# Patient Record
Sex: Female | Born: 1935
Health system: Southern US, Community
[De-identification: ages and names within clinical notes are randomized; demographics above are authoritative.]

## PROBLEM LIST (undated history)

## (undated) ENCOUNTER — Emergency Department (HOSPITAL_COMMUNITY): Disposition: A | Discharge status: Home / Self Care | Payer: Medicare Other

## (undated) DIAGNOSIS — E039 Hypothyroidism, unspecified: Secondary | ICD-10-CM

## (undated) DIAGNOSIS — C801 Malignant (primary) neoplasm, unspecified: Secondary | ICD-10-CM

## (undated) DIAGNOSIS — J449 Chronic obstructive pulmonary disease, unspecified: Secondary | ICD-10-CM

## (undated) DIAGNOSIS — K5790 Diverticulosis of intestine, part unspecified, without perforation or abscess without bleeding: Secondary | ICD-10-CM

## (undated) DIAGNOSIS — I272 Pulmonary hypertension, unspecified: Secondary | ICD-10-CM

## (undated) DIAGNOSIS — F32A Depression, unspecified: Secondary | ICD-10-CM

## (undated) DIAGNOSIS — J189 Pneumonia, unspecified organism: Secondary | ICD-10-CM

## (undated) DIAGNOSIS — M797 Fibromyalgia: Secondary | ICD-10-CM

## (undated) DIAGNOSIS — I241 Dressler's syndrome: Secondary | ICD-10-CM

## (undated) DIAGNOSIS — Z5189 Encounter for other specified aftercare: Secondary | ICD-10-CM

## (undated) DIAGNOSIS — H3530 Unspecified macular degeneration: Secondary | ICD-10-CM

## (undated) DIAGNOSIS — E785 Hyperlipidemia, unspecified: Secondary | ICD-10-CM

## (undated) DIAGNOSIS — I219 Acute myocardial infarction, unspecified: Secondary | ICD-10-CM

## (undated) DIAGNOSIS — I639 Cerebral infarction, unspecified: Secondary | ICD-10-CM

## (undated) DIAGNOSIS — I2510 Atherosclerotic heart disease of native coronary artery without angina pectoris: Secondary | ICD-10-CM

## (undated) DIAGNOSIS — D126 Benign neoplasm of colon, unspecified: Secondary | ICD-10-CM

## (undated) DIAGNOSIS — I7300 Raynaud's syndrome without gangrene: Secondary | ICD-10-CM

## (undated) DIAGNOSIS — H548 Legal blindness, as defined in USA: Secondary | ICD-10-CM

## (undated) DIAGNOSIS — K5909 Other constipation: Secondary | ICD-10-CM

## (undated) DIAGNOSIS — K219 Gastro-esophageal reflux disease without esophagitis: Secondary | ICD-10-CM

## (undated) DIAGNOSIS — M5090 Cervical disc disorder, unspecified, unspecified cervical region: Secondary | ICD-10-CM

## (undated) DIAGNOSIS — M3500 Sicca syndrome, unspecified: Secondary | ICD-10-CM

## (undated) DIAGNOSIS — F329 Major depressive disorder, single episode, unspecified: Secondary | ICD-10-CM

## (undated) DIAGNOSIS — M349 Systemic sclerosis, unspecified: Secondary | ICD-10-CM

## (undated) DIAGNOSIS — K589 Irritable bowel syndrome without diarrhea: Secondary | ICD-10-CM

## (undated) DIAGNOSIS — G4730 Sleep apnea, unspecified: Secondary | ICD-10-CM

## (undated) DIAGNOSIS — K225 Diverticulum of esophagus, acquired: Secondary | ICD-10-CM

## (undated) DIAGNOSIS — I10 Essential (primary) hypertension: Secondary | ICD-10-CM

## (undated) DIAGNOSIS — M1990 Unspecified osteoarthritis, unspecified site: Secondary | ICD-10-CM

## (undated) DIAGNOSIS — IMO0001 Reserved for inherently not codable concepts without codable children: Secondary | ICD-10-CM

## (undated) HISTORY — DX: Systemic sclerosis, unspecified: M34.9

## (undated) HISTORY — DX: Gastro-esophageal reflux disease without esophagitis: K21.9

## (undated) HISTORY — DX: Acute myocardial infarction, unspecified: I21.9

## (undated) HISTORY — DX: Reserved for inherently not codable concepts without codable children: IMO0001

## (undated) HISTORY — DX: Unspecified macular degeneration: H35.30

## (undated) HISTORY — DX: Encounter for other specified aftercare: Z51.89

## (undated) HISTORY — DX: Other constipation: K59.09

## (undated) HISTORY — DX: Diverticulosis of intestine, part unspecified, without perforation or abscess without bleeding: K57.90

## (undated) HISTORY — DX: Atherosclerotic heart disease of native coronary artery without angina pectoris: I25.10

## (undated) HISTORY — PX: CARDIAC CATHETERIZATION: SHX172

## (undated) HISTORY — DX: Benign neoplasm of colon, unspecified: D12.6

## (undated) HISTORY — PX: APPENDECTOMY: SHX54

## (undated) HISTORY — DX: Sjogren syndrome, unspecified: M35.00

## (undated) HISTORY — DX: Hyperlipidemia, unspecified: E78.5

## (undated) HISTORY — DX: Irritable bowel syndrome, unspecified: K58.9

## (undated) HISTORY — PX: CATARACT EXTRACTION: SUR2

## (undated) HISTORY — DX: Raynaud's syndrome without gangrene: I73.00

## (undated) HISTORY — DX: Cerebral infarction, unspecified: I63.9

## (undated) HISTORY — DX: Chronic obstructive pulmonary disease, unspecified: J44.9

## (undated) HISTORY — DX: Major depressive disorder, single episode, unspecified: F32.9

## (undated) HISTORY — DX: Hypothyroidism, unspecified: E03.9

## (undated) HISTORY — PX: OTHER SURGICAL HISTORY: SHX169

## (undated) HISTORY — DX: Unspecified osteoarthritis, unspecified site: M19.90

## (undated) HISTORY — DX: Cervical disc disorder, unspecified, unspecified cervical region: M50.90

## (undated) HISTORY — DX: Pulmonary hypertension, unspecified: I27.20

## (undated) HISTORY — DX: Essential (primary) hypertension: I10

## (undated) HISTORY — DX: Dressler's syndrome: I24.1

## (undated) HISTORY — DX: Fibromyalgia: M79.7

## (undated) HISTORY — DX: Diverticulum of esophagus, acquired: K22.5

## (undated) HISTORY — DX: Depression, unspecified: F32.A

---

## 1963-06-27 HISTORY — PX: CHOLECYSTECTOMY: SHX55

## 1971-06-27 HISTORY — PX: TOTAL ABDOMINAL HYSTERECTOMY: SHX209

## 1988-06-26 DIAGNOSIS — I639 Cerebral infarction, unspecified: Secondary | ICD-10-CM

## 1988-06-26 HISTORY — DX: Cerebral infarction, unspecified: I63.9

## 1989-06-26 DIAGNOSIS — I639 Cerebral infarction, unspecified: Secondary | ICD-10-CM

## 1989-06-26 HISTORY — DX: Cerebral infarction, unspecified: I63.9

## 1989-06-26 HISTORY — PX: THYROID LOBECTOMY: SHX420

## 1995-06-27 HISTORY — PX: SPINAL FUSION: SHX223

## 1999-08-08 ENCOUNTER — Encounter: Payer: Self-pay | Admitting: Neurosurgery

## 1999-08-08 ENCOUNTER — Encounter: Admission: RE | Admit: 1999-08-08 | Discharge: 1999-08-08 | Payer: Self-pay | Admitting: Neurosurgery

## 1999-08-12 ENCOUNTER — Encounter: Payer: Self-pay | Admitting: Neurosurgery

## 1999-08-12 ENCOUNTER — Encounter: Admission: RE | Admit: 1999-08-12 | Discharge: 1999-08-12 | Payer: Self-pay | Admitting: Neurosurgery

## 2000-02-20 ENCOUNTER — Other Ambulatory Visit: Admission: RE | Admit: 2000-02-20 | Discharge: 2000-02-20 | Payer: Self-pay | Admitting: *Deleted

## 2000-06-26 HISTORY — PX: BILATERAL OOPHORECTOMY: SHX1221

## 2000-08-14 ENCOUNTER — Encounter: Payer: Self-pay | Admitting: *Deleted

## 2000-08-15 ENCOUNTER — Encounter (INDEPENDENT_AMBULATORY_CARE_PROVIDER_SITE_OTHER): Payer: Self-pay | Admitting: Specialist

## 2000-08-15 ENCOUNTER — Observation Stay (HOSPITAL_COMMUNITY): Admission: RE | Admit: 2000-08-15 | Discharge: 2000-08-16 | Payer: Self-pay | Admitting: *Deleted

## 2000-10-30 ENCOUNTER — Encounter: Admission: RE | Admit: 2000-10-30 | Discharge: 2000-10-30 | Payer: Self-pay | Admitting: Gastroenterology

## 2000-10-30 ENCOUNTER — Encounter: Payer: Self-pay | Admitting: Gastroenterology

## 2001-12-17 ENCOUNTER — Ambulatory Visit (HOSPITAL_COMMUNITY): Admission: RE | Admit: 2001-12-17 | Discharge: 2001-12-17 | Payer: Self-pay | Admitting: Gastroenterology

## 2001-12-17 ENCOUNTER — Encounter: Payer: Self-pay | Admitting: Gastroenterology

## 2001-12-20 ENCOUNTER — Ambulatory Visit (HOSPITAL_COMMUNITY): Admission: RE | Admit: 2001-12-20 | Discharge: 2001-12-20 | Payer: Self-pay | Admitting: Gastroenterology

## 2001-12-20 ENCOUNTER — Encounter: Payer: Self-pay | Admitting: Gastroenterology

## 2002-09-04 ENCOUNTER — Ambulatory Visit (HOSPITAL_COMMUNITY): Admission: RE | Admit: 2002-09-04 | Discharge: 2002-09-04 | Payer: Self-pay | Admitting: Neurosurgery

## 2002-09-04 ENCOUNTER — Encounter: Payer: Self-pay | Admitting: Neurosurgery

## 2002-10-13 ENCOUNTER — Encounter: Payer: Self-pay | Admitting: Neurosurgery

## 2002-10-15 ENCOUNTER — Inpatient Hospital Stay (HOSPITAL_COMMUNITY): Admission: RE | Admit: 2002-10-15 | Discharge: 2002-10-21 | Payer: Self-pay | Admitting: Neurosurgery

## 2002-10-15 ENCOUNTER — Encounter: Payer: Self-pay | Admitting: Neurosurgery

## 2002-10-21 ENCOUNTER — Inpatient Hospital Stay (HOSPITAL_COMMUNITY)
Admission: RE | Admit: 2002-10-21 | Discharge: 2002-10-27 | Payer: Self-pay | Admitting: Physical Medicine & Rehabilitation

## 2003-07-14 ENCOUNTER — Ambulatory Visit (HOSPITAL_COMMUNITY): Admission: RE | Admit: 2003-07-14 | Discharge: 2003-07-15 | Payer: Self-pay | Admitting: Cardiology

## 2004-05-16 ENCOUNTER — Ambulatory Visit: Payer: Self-pay | Admitting: Internal Medicine

## 2004-07-01 ENCOUNTER — Ambulatory Visit: Payer: Self-pay

## 2004-09-19 ENCOUNTER — Emergency Department (HOSPITAL_COMMUNITY): Admission: EM | Admit: 2004-09-19 | Discharge: 2004-09-19 | Payer: Self-pay | Admitting: Emergency Medicine

## 2004-10-24 ENCOUNTER — Ambulatory Visit: Payer: Self-pay | Admitting: Internal Medicine

## 2005-07-16 ENCOUNTER — Encounter: Admission: RE | Admit: 2005-07-16 | Discharge: 2005-07-16 | Payer: Self-pay | Admitting: Neurosurgery

## 2005-07-20 ENCOUNTER — Ambulatory Visit: Payer: Self-pay | Admitting: Internal Medicine

## 2005-07-31 ENCOUNTER — Ambulatory Visit: Payer: Self-pay

## 2005-08-21 ENCOUNTER — Ambulatory Visit: Payer: Self-pay | Admitting: Internal Medicine

## 2005-10-23 ENCOUNTER — Ambulatory Visit: Payer: Self-pay | Admitting: Gastroenterology

## 2005-11-27 ENCOUNTER — Ambulatory Visit: Payer: Self-pay | Admitting: Gastroenterology

## 2005-12-05 ENCOUNTER — Ambulatory Visit: Payer: Self-pay | Admitting: Gastroenterology

## 2005-12-05 ENCOUNTER — Encounter (INDEPENDENT_AMBULATORY_CARE_PROVIDER_SITE_OTHER): Payer: Self-pay | Admitting: *Deleted

## 2005-12-15 ENCOUNTER — Ambulatory Visit: Payer: Self-pay | Admitting: Gastroenterology

## 2005-12-15 ENCOUNTER — Ambulatory Visit: Payer: Self-pay | Admitting: Cardiovascular Disease

## 2005-12-20 ENCOUNTER — Other Ambulatory Visit: Admission: RE | Admit: 2005-12-20 | Discharge: 2005-12-20 | Payer: Self-pay | Admitting: *Deleted

## 2006-11-08 ENCOUNTER — Ambulatory Visit: Payer: Self-pay | Admitting: Internal Medicine

## 2007-04-25 ENCOUNTER — Encounter: Admission: RE | Admit: 2007-04-25 | Discharge: 2007-04-25 | Payer: Self-pay | Admitting: Internal Medicine

## 2007-05-28 ENCOUNTER — Ambulatory Visit: Payer: Self-pay | Admitting: Pulmonary Disease

## 2007-05-28 LAB — CONVERTED CEMR LAB
ALT: 20 units/L (ref 0–35)
ANA Titer 1: 1:160 {titer} — ABNORMAL HIGH
AST: 23 units/L (ref 0–37)
Albumin: 4 g/dL (ref 3.5–5.2)
Alkaline Phosphatase: 59 units/L (ref 39–117)
Angiotensin 1 Converting Enzyme: 35 units/L (ref 9–67)
Anti Nuclear Antibody(ANA): POSITIVE — AB
BUN: 13 mg/dL (ref 6–23)
Basophils Absolute: 0.1 10*3/uL (ref 0.0–0.1)
Basophils Relative: 1 % (ref 0.0–1.0)
Bilirubin, Direct: 0.1 mg/dL (ref 0.0–0.3)
CO2: 29 meq/L (ref 19–32)
Calcium: 9.4 mg/dL (ref 8.4–10.5)
Chloride: 95 meq/L — ABNORMAL LOW (ref 96–112)
Creatinine, Ser: 0.9 mg/dL (ref 0.4–1.2)
Eosinophils Absolute: 0.4 10*3/uL (ref 0.0–0.6)
Eosinophils Relative: 4.2 % (ref 0.0–5.0)
GFR calc Af Amer: 79 mL/min
GFR calc non Af Amer: 65 mL/min
Glucose, Bld: 147 mg/dL — ABNORMAL HIGH (ref 70–99)
HCT: 39.2 % (ref 36.0–46.0)
Hemoglobin: 13.6 g/dL (ref 12.0–15.0)
IgE (Immunoglobulin E), Serum: 4.7 intl units/mL (ref 0.0–180.0)
Lymphocytes Relative: 27.8 % (ref 12.0–46.0)
MCHC: 34.7 g/dL (ref 30.0–36.0)
MCV: 82.4 fL (ref 78.0–100.0)
Monocytes Absolute: 0.7 10*3/uL (ref 0.2–0.7)
Monocytes Relative: 7.9 % (ref 3.0–11.0)
Neutro Abs: 5.1 10*3/uL (ref 1.4–7.7)
Neutrophils Relative %: 59.1 % (ref 43.0–77.0)
Phosphorus: 4 mg/dL (ref 2.3–4.6)
Platelets: 280 10*3/uL (ref 150–400)
Potassium: 4.2 meq/L (ref 3.5–5.1)
RBC: 4.76 M/uL (ref 3.87–5.11)
RDW: 14.2 % (ref 11.5–14.6)
Sed Rate: 22 mm/hr (ref 0–25)
Sodium: 136 meq/L (ref 135–145)
TSH: 3.1 microintl units/mL (ref 0.35–5.50)
Total Bilirubin: 0.6 mg/dL (ref 0.3–1.2)
Total Protein: 6.9 g/dL (ref 6.0–8.3)
WBC: 8.7 10*3/uL (ref 4.5–10.5)

## 2007-06-11 ENCOUNTER — Ambulatory Visit: Payer: Self-pay | Admitting: Gastroenterology

## 2007-06-11 LAB — CONVERTED CEMR LAB: Tissue Transglutaminase Ab, IgA: 0.5 units (ref <7)

## 2007-06-26 DIAGNOSIS — I10 Essential (primary) hypertension: Secondary | ICD-10-CM | POA: Insufficient documentation

## 2007-06-26 DIAGNOSIS — F329 Major depressive disorder, single episode, unspecified: Secondary | ICD-10-CM

## 2007-06-26 DIAGNOSIS — E785 Hyperlipidemia, unspecified: Secondary | ICD-10-CM | POA: Insufficient documentation

## 2007-06-26 DIAGNOSIS — R141 Gas pain: Secondary | ICD-10-CM

## 2007-06-26 DIAGNOSIS — M47812 Spondylosis without myelopathy or radiculopathy, cervical region: Secondary | ICD-10-CM | POA: Insufficient documentation

## 2007-06-26 DIAGNOSIS — I6350 Cerebral infarction due to unspecified occlusion or stenosis of unspecified cerebral artery: Secondary | ICD-10-CM | POA: Insufficient documentation

## 2007-06-26 DIAGNOSIS — K2289 Other specified disease of esophagus: Secondary | ICD-10-CM | POA: Insufficient documentation

## 2007-06-26 DIAGNOSIS — K5909 Other constipation: Secondary | ICD-10-CM

## 2007-06-26 DIAGNOSIS — E079 Disorder of thyroid, unspecified: Secondary | ICD-10-CM | POA: Insufficient documentation

## 2007-06-26 DIAGNOSIS — K589 Irritable bowel syndrome without diarrhea: Secondary | ICD-10-CM | POA: Insufficient documentation

## 2007-06-26 DIAGNOSIS — K5730 Diverticulosis of large intestine without perforation or abscess without bleeding: Secondary | ICD-10-CM | POA: Insufficient documentation

## 2007-06-26 DIAGNOSIS — K219 Gastro-esophageal reflux disease without esophagitis: Secondary | ICD-10-CM | POA: Insufficient documentation

## 2007-06-26 DIAGNOSIS — M129 Arthropathy, unspecified: Secondary | ICD-10-CM | POA: Insufficient documentation

## 2007-06-26 DIAGNOSIS — K2970 Gastritis, unspecified, without bleeding: Secondary | ICD-10-CM | POA: Insufficient documentation

## 2007-06-26 DIAGNOSIS — K660 Peritoneal adhesions (postprocedural) (postinfection): Secondary | ICD-10-CM | POA: Insufficient documentation

## 2007-06-26 DIAGNOSIS — K225 Diverticulum of esophagus, acquired: Secondary | ICD-10-CM | POA: Insufficient documentation

## 2007-06-26 DIAGNOSIS — E119 Type 2 diabetes mellitus without complications: Secondary | ICD-10-CM | POA: Insufficient documentation

## 2007-06-26 DIAGNOSIS — R142 Eructation: Secondary | ICD-10-CM | POA: Insufficient documentation

## 2007-06-26 DIAGNOSIS — K2990 Gastroduodenitis, unspecified, without bleeding: Secondary | ICD-10-CM

## 2007-06-26 DIAGNOSIS — R143 Flatulence: Secondary | ICD-10-CM

## 2007-06-26 DIAGNOSIS — H548 Legal blindness, as defined in USA: Secondary | ICD-10-CM | POA: Insufficient documentation

## 2007-06-26 DIAGNOSIS — I2510 Atherosclerotic heart disease of native coronary artery without angina pectoris: Secondary | ICD-10-CM | POA: Insufficient documentation

## 2007-06-26 DIAGNOSIS — R1310 Dysphagia, unspecified: Secondary | ICD-10-CM | POA: Insufficient documentation

## 2007-06-26 DIAGNOSIS — K228 Other specified diseases of esophagus: Secondary | ICD-10-CM

## 2007-06-26 HISTORY — DX: Other constipation: K59.09

## 2007-07-08 ENCOUNTER — Ambulatory Visit: Payer: Self-pay | Admitting: Pulmonary Disease

## 2007-07-08 DIAGNOSIS — J310 Chronic rhinitis: Secondary | ICD-10-CM | POA: Insufficient documentation

## 2007-07-08 DIAGNOSIS — J438 Other emphysema: Secondary | ICD-10-CM | POA: Insufficient documentation

## 2007-07-23 ENCOUNTER — Ambulatory Visit: Payer: Self-pay | Admitting: Gastroenterology

## 2007-07-23 LAB — CONVERTED CEMR LAB
Fecal Occult Blood: NEGATIVE
OCCULT 1: NEGATIVE
OCCULT 2: NEGATIVE
OCCULT 3: NEGATIVE
OCCULT 4: NEGATIVE
OCCULT 5: NEGATIVE

## 2007-07-24 ENCOUNTER — Telehealth: Payer: Self-pay | Admitting: Pulmonary Disease

## 2007-07-26 ENCOUNTER — Telehealth (INDEPENDENT_AMBULATORY_CARE_PROVIDER_SITE_OTHER): Payer: Self-pay | Admitting: *Deleted

## 2007-08-06 ENCOUNTER — Encounter: Payer: Self-pay | Admitting: Pulmonary Disease

## 2007-08-21 ENCOUNTER — Encounter: Payer: Self-pay | Admitting: Pulmonary Disease

## 2007-09-17 ENCOUNTER — Encounter: Admission: RE | Admit: 2007-09-17 | Discharge: 2007-09-17 | Payer: Self-pay

## 2007-09-26 ENCOUNTER — Encounter: Admission: RE | Admit: 2007-09-26 | Discharge: 2007-09-26 | Payer: Self-pay | Admitting: Neurosurgery

## 2007-11-07 ENCOUNTER — Ambulatory Visit: Payer: Self-pay | Admitting: Internal Medicine

## 2007-11-15 ENCOUNTER — Ambulatory Visit: Payer: Self-pay | Admitting: Internal Medicine

## 2007-11-15 LAB — CONVERTED CEMR LAB
Cholesterol: 194 mg/dL (ref 0–200)
HDL: 38.2 mg/dL — ABNORMAL LOW (ref >39.0)
LDL Cholesterol: 128 mg/dL — ABNORMAL HIGH (ref 0–99)
Total CHOL/HDL Ratio: 5.1
Triglycerides: 138 mg/dL (ref 0–149)
VLDL: 28 mg/dL (ref 0–40)

## 2007-11-20 ENCOUNTER — Inpatient Hospital Stay (HOSPITAL_COMMUNITY): Admission: AD | Admit: 2007-11-20 | Discharge: 2007-11-24 | Payer: Self-pay | Admitting: Cardiovascular Disease

## 2007-11-20 ENCOUNTER — Ambulatory Visit: Payer: Self-pay | Admitting: Cardiovascular Disease

## 2007-11-27 ENCOUNTER — Ambulatory Visit: Payer: Self-pay | Admitting: Internal Medicine

## 2007-11-27 ENCOUNTER — Inpatient Hospital Stay (HOSPITAL_COMMUNITY): Admission: AD | Admit: 2007-11-27 | Discharge: 2007-11-28 | Payer: Self-pay | Admitting: Cardiology

## 2007-11-27 ENCOUNTER — Ambulatory Visit: Payer: Self-pay | Admitting: Cardiology

## 2007-11-27 ENCOUNTER — Ambulatory Visit: Payer: Self-pay | Admitting: Pulmonary Disease

## 2007-11-28 ENCOUNTER — Ambulatory Visit: Payer: Self-pay | Admitting: Vascular Surgery

## 2007-11-28 ENCOUNTER — Encounter: Payer: Self-pay | Admitting: Cardiology

## 2007-12-10 ENCOUNTER — Ambulatory Visit: Payer: Self-pay | Admitting: Pulmonary Disease

## 2007-12-11 ENCOUNTER — Ambulatory Visit: Payer: Self-pay | Admitting: Internal Medicine

## 2007-12-11 ENCOUNTER — Ambulatory Visit: Payer: Self-pay | Admitting: Cardiovascular Disease

## 2007-12-11 LAB — CONVERTED CEMR LAB
Basophils Absolute: 0.1 10*3/uL (ref 0.0–0.1)
Basophils Relative: 0.7 % (ref 0.0–1.0)
Eosinophils Absolute: 0.3 10*3/uL (ref 0.0–0.7)
Eosinophils Relative: 3.7 % (ref 0.0–5.0)
HCT: 36.5 % (ref 36.0–46.0)
Hemoglobin: 12.5 g/dL (ref 12.0–15.0)
Lymphocytes Relative: 27.3 % (ref 12.0–46.0)
MCHC: 34.3 g/dL (ref 30.0–36.0)
MCV: 86.5 fL (ref 78.0–100.0)
Monocytes Absolute: 0.7 10*3/uL (ref 0.1–1.0)
Monocytes Relative: 9 % (ref 3.0–12.0)
Neutro Abs: 4.8 10*3/uL (ref 1.4–7.7)
Neutrophils Relative %: 59.3 % (ref 43.0–77.0)
Platelets: 286 10*3/uL (ref 150–400)
RBC: 4.22 M/uL (ref 3.87–5.11)
RDW: 13.2 % (ref 11.5–14.6)
WBC: 8.1 10*3/uL (ref 4.5–10.5)

## 2007-12-13 ENCOUNTER — Ambulatory Visit: Payer: Self-pay | Admitting: Cardiology

## 2007-12-13 ENCOUNTER — Inpatient Hospital Stay (HOSPITAL_COMMUNITY): Admission: EM | Admit: 2007-12-13 | Discharge: 2007-12-16 | Payer: Self-pay | Admitting: Emergency Medicine

## 2007-12-14 ENCOUNTER — Encounter: Payer: Self-pay | Admitting: Internal Medicine

## 2007-12-20 ENCOUNTER — Ambulatory Visit: Payer: Self-pay | Admitting: Internal Medicine

## 2007-12-20 LAB — CONVERTED CEMR LAB
BUN: 12 mg/dL (ref 6–23)
Basophils Absolute: 0.1 10*3/uL (ref 0.0–0.1)
Basophils Relative: 0.7 % (ref 0.0–1.0)
CO2: 28 meq/L (ref 19–32)
Calcium: 8.9 mg/dL (ref 8.4–10.5)
Chloride: 97 meq/L (ref 96–112)
Creatinine, Ser: 0.9 mg/dL (ref 0.4–1.2)
Eosinophils Absolute: 0.4 10*3/uL (ref 0.0–0.7)
Eosinophils Relative: 4.5 % (ref 0.0–5.0)
GFR calc Af Amer: 79 mL/min
GFR calc non Af Amer: 65 mL/min
Glucose, Bld: 101 mg/dL — ABNORMAL HIGH (ref 70–99)
HCT: 33.8 % — ABNORMAL LOW (ref 36.0–46.0)
Hemoglobin: 11.4 g/dL — ABNORMAL LOW (ref 12.0–15.0)
Lymphocytes Relative: 20.7 % (ref 12.0–46.0)
MCHC: 33.6 g/dL (ref 30.0–36.0)
MCV: 86.7 fL (ref 78.0–100.0)
Monocytes Absolute: 1 10*3/uL (ref 0.1–1.0)
Monocytes Relative: 9.8 % (ref 3.0–12.0)
Neutro Abs: 6.2 10*3/uL (ref 1.4–7.7)
Neutrophils Relative %: 64.3 % (ref 43.0–77.0)
Platelets: 322 10*3/uL (ref 150–400)
Potassium: 4.2 meq/L (ref 3.5–5.1)
Pro B Natriuretic peptide (BNP): 345 pg/mL — ABNORMAL HIGH (ref 0.0–100.0)
RBC: 3.9 M/uL (ref 3.87–5.11)
RDW: 13.3 % (ref 11.5–14.6)
Sed Rate: 67 mm/hr — ABNORMAL HIGH (ref 0–22)
Sodium: 134 meq/L — ABNORMAL LOW (ref 135–145)
WBC: 9.7 10*3/uL (ref 4.5–10.5)

## 2007-12-30 ENCOUNTER — Ambulatory Visit: Payer: Self-pay | Admitting: Internal Medicine

## 2007-12-30 LAB — CONVERTED CEMR LAB
Basophils Absolute: 0.1 10*3/uL (ref 0.0–0.1)
Basophils Relative: 1 % (ref 0.0–1.0)
Eosinophils Absolute: 0.2 10*3/uL (ref 0.0–0.7)
Eosinophils Relative: 2 % (ref 0.0–5.0)
HCT: 36 % (ref 36.0–46.0)
Hemoglobin: 12.3 g/dL (ref 12.0–15.0)
Lymphocytes Relative: 14.4 % (ref 12.0–46.0)
MCHC: 34.2 g/dL (ref 30.0–36.0)
MCV: 84.6 fL (ref 78.0–100.0)
Monocytes Absolute: 1 10*3/uL (ref 0.1–1.0)
Monocytes Relative: 8.5 % (ref 3.0–12.0)
Neutro Abs: 8.8 10*3/uL — ABNORMAL HIGH (ref 1.4–7.7)
Neutrophils Relative %: 74.1 % (ref 43.0–77.0)
Platelets: 404 10*3/uL — ABNORMAL HIGH (ref 150–400)
Pro B Natriuretic peptide (BNP): 158 pg/mL — ABNORMAL HIGH (ref 0.0–100.0)
RBC: 4.25 M/uL (ref 3.87–5.11)
RDW: 12.7 % (ref 11.5–14.6)
Sed Rate: 53 mm/hr — ABNORMAL HIGH (ref 0–22)
WBC: 11.8 10*3/uL — ABNORMAL HIGH (ref 4.5–10.5)

## 2008-01-15 ENCOUNTER — Ambulatory Visit: Payer: Self-pay | Admitting: Cardiovascular Disease

## 2008-01-15 LAB — CONVERTED CEMR LAB
Basophils Absolute: 0.1 10*3/uL (ref 0.0–0.1)
Basophils Relative: 1.7 % (ref 0.0–3.0)
Eosinophils Absolute: 0.3 10*3/uL (ref 0.0–0.7)
Eosinophils Relative: 4 % (ref 0.0–5.0)
HCT: 39.5 % (ref 36.0–46.0)
Hemoglobin: 13.3 g/dL (ref 12.0–15.0)
Lymphocytes Relative: 21.9 % (ref 12.0–46.0)
MCHC: 33.7 g/dL (ref 30.0–36.0)
MCV: 85.3 fL (ref 78.0–100.0)
Monocytes Absolute: 0.5 10*3/uL (ref 0.1–1.0)
Monocytes Relative: 7 % (ref 3.0–12.0)
Neutro Abs: 5 10*3/uL (ref 1.4–7.7)
Neutrophils Relative %: 65.4 % (ref 43.0–77.0)
Platelets: 279 10*3/uL (ref 150–400)
RBC: 4.63 M/uL (ref 3.87–5.11)
RDW: 13.5 % (ref 11.5–14.6)
WBC: 7.6 10*3/uL (ref 4.5–10.5)

## 2008-01-29 ENCOUNTER — Ambulatory Visit: Payer: Self-pay | Admitting: Cardiovascular Disease

## 2008-01-29 LAB — CONVERTED CEMR LAB
Basophils Absolute: 0.1 10*3/uL (ref 0.0–0.1)
Basophils Relative: 1.9 % (ref 0.0–3.0)
Eosinophils Absolute: 0.3 10*3/uL (ref 0.0–0.7)
Eosinophils Relative: 4.7 % (ref 0.0–5.0)
HCT: 37.1 % (ref 36.0–46.0)
Hemoglobin: 12.6 g/dL (ref 12.0–15.0)
Lymphocytes Relative: 29.1 % (ref 12.0–46.0)
MCHC: 33.8 g/dL (ref 30.0–36.0)
MCV: 83.6 fL (ref 78.0–100.0)
Monocytes Absolute: 0.4 10*3/uL (ref 0.1–1.0)
Monocytes Relative: 7.7 % (ref 3.0–12.0)
Neutro Abs: 3.1 10*3/uL (ref 1.4–7.7)
Neutrophils Relative %: 56.6 % (ref 43.0–77.0)
Platelets: 278 10*3/uL (ref 150–400)
RBC: 4.44 M/uL (ref 3.87–5.11)
RDW: 13.7 % (ref 11.5–14.6)
WBC: 5.5 10*3/uL (ref 4.5–10.5)

## 2008-02-12 ENCOUNTER — Ambulatory Visit: Payer: Self-pay | Admitting: Internal Medicine

## 2008-02-12 LAB — CONVERTED CEMR LAB
Basophils Absolute: 0.1 10*3/uL (ref 0.0–0.1)
Basophils Relative: 1.5 % (ref 0.0–3.0)
Eosinophils Absolute: 0.3 10*3/uL (ref 0.0–0.7)
Eosinophils Relative: 5.3 % — ABNORMAL HIGH (ref 0.0–5.0)
HCT: 36.1 % (ref 36.0–46.0)
Hemoglobin: 12.6 g/dL (ref 12.0–15.0)
Lymphocytes Relative: 24.7 % (ref 12.0–46.0)
MCHC: 34.9 g/dL (ref 30.0–36.0)
MCV: 84.7 fL (ref 78.0–100.0)
Monocytes Absolute: 0.5 10*3/uL (ref 0.1–1.0)
Monocytes Relative: 8 % (ref 3.0–12.0)
Neutro Abs: 3.8 10*3/uL (ref 1.4–7.7)
Neutrophils Relative %: 60.5 % (ref 43.0–77.0)
Platelets: 254 10*3/uL (ref 150–400)
RBC: 4.26 M/uL (ref 3.87–5.11)
RDW: 14.5 % (ref 11.5–14.6)
WBC: 6.3 10*3/uL (ref 4.5–10.5)

## 2008-02-17 ENCOUNTER — Ambulatory Visit: Payer: Self-pay | Admitting: Internal Medicine

## 2008-02-18 ENCOUNTER — Ambulatory Visit: Payer: Self-pay

## 2008-02-18 ENCOUNTER — Encounter: Payer: Self-pay | Admitting: Internal Medicine

## 2008-02-24 ENCOUNTER — Ambulatory Visit: Payer: Self-pay | Admitting: Cardiology

## 2008-02-24 ENCOUNTER — Inpatient Hospital Stay (HOSPITAL_COMMUNITY): Admission: EM | Admit: 2008-02-24 | Discharge: 2008-02-25 | Payer: Self-pay | Admitting: Emergency Medicine

## 2008-03-12 ENCOUNTER — Ambulatory Visit: Payer: Self-pay | Admitting: Internal Medicine

## 2008-03-12 LAB — CONVERTED CEMR LAB
Basophils Absolute: 0.1 10*3/uL (ref 0.0–0.1)
Basophils Relative: 0.8 % (ref 0.0–3.0)
Eosinophils Absolute: 0.3 10*3/uL (ref 0.0–0.7)
Eosinophils Relative: 4.1 % (ref 0.0–5.0)
HCT: 40.7 % (ref 36.0–46.0)
Hemoglobin: 13.8 g/dL (ref 12.0–15.0)
Lymphocytes Relative: 26.2 % (ref 12.0–46.0)
MCHC: 33.9 g/dL (ref 30.0–36.0)
MCV: 85.7 fL (ref 78.0–100.0)
Monocytes Absolute: 0.6 10*3/uL (ref 0.1–1.0)
Monocytes Relative: 8.3 % (ref 3.0–12.0)
Neutro Abs: 4.1 10*3/uL (ref 1.4–7.7)
Neutrophils Relative %: 60.6 % (ref 43.0–77.0)
Platelets: 243 10*3/uL (ref 150–400)
RBC: 4.75 M/uL (ref 3.87–5.11)
RDW: 15.4 % — ABNORMAL HIGH (ref 11.5–14.6)
WBC: 6.9 10*3/uL (ref 4.5–10.5)

## 2008-03-26 ENCOUNTER — Ambulatory Visit: Payer: Self-pay | Admitting: Internal Medicine

## 2008-03-26 ENCOUNTER — Encounter: Admission: RE | Admit: 2008-03-26 | Discharge: 2008-03-26 | Payer: Self-pay | Admitting: Neurosurgery

## 2008-03-26 LAB — CONVERTED CEMR LAB
AST: 21 units/L (ref 0–37)
Basophils Absolute: 0.1 10*3/uL (ref 0.0–0.1)
Basophils Relative: 1.2 % (ref 0.0–3.0)
Eosinophils Absolute: 0.3 10*3/uL (ref 0.0–0.7)
Eosinophils Relative: 5.1 % — ABNORMAL HIGH (ref 0.0–5.0)
HCT: 38.5 % (ref 36.0–46.0)
Hemoglobin: 13 g/dL (ref 12.0–15.0)
Lymphocytes Relative: 33.1 % (ref 12.0–46.0)
MCHC: 33.8 g/dL (ref 30.0–36.0)
MCV: 86.4 fL (ref 78.0–100.0)
Monocytes Absolute: 0.5 10*3/uL (ref 0.1–1.0)
Monocytes Relative: 8.5 % (ref 3.0–12.0)
Neutro Abs: 2.9 10*3/uL (ref 1.4–7.7)
Neutrophils Relative %: 52.1 % (ref 43.0–77.0)
Platelets: 209 10*3/uL (ref 150–400)
RBC: 4.45 M/uL (ref 3.87–5.11)
RDW: 15.1 % — ABNORMAL HIGH (ref 11.5–14.6)
WBC: 5.7 10*3/uL (ref 4.5–10.5)

## 2008-04-14 ENCOUNTER — Ambulatory Visit: Payer: Self-pay | Admitting: Internal Medicine

## 2008-04-14 LAB — CONVERTED CEMR LAB
Basophils Absolute: 0.1 10*3/uL (ref 0.0–0.1)
Basophils Relative: 1.1 % (ref 0.0–3.0)
Eosinophils Absolute: 0.3 10*3/uL (ref 0.0–0.7)
Eosinophils Relative: 5 % (ref 0.0–5.0)
HCT: 38.3 % (ref 36.0–46.0)
Hemoglobin: 13.2 g/dL (ref 12.0–15.0)
Lymphocytes Relative: 30.3 % (ref 12.0–46.0)
MCHC: 34.4 g/dL (ref 30.0–36.0)
MCV: 88 fL (ref 78.0–100.0)
Monocytes Absolute: 0.5 10*3/uL (ref 0.1–1.0)
Monocytes Relative: 8 % (ref 3.0–12.0)
Neutro Abs: 3.1 10*3/uL (ref 1.4–7.7)
Neutrophils Relative %: 55.6 % (ref 43.0–77.0)
Platelets: 218 10*3/uL (ref 150–400)
RBC: 4.36 M/uL (ref 3.87–5.11)
RDW: 14.5 % (ref 11.5–14.6)
WBC: 5.7 10*3/uL (ref 4.5–10.5)

## 2008-04-16 ENCOUNTER — Encounter (HOSPITAL_COMMUNITY): Admission: RE | Admit: 2008-04-16 | Discharge: 2008-06-24 | Payer: Self-pay | Admitting: Internal Medicine

## 2008-05-11 ENCOUNTER — Ambulatory Visit: Payer: Self-pay | Admitting: Internal Medicine

## 2008-05-18 ENCOUNTER — Ambulatory Visit: Payer: Self-pay | Admitting: Internal Medicine

## 2008-05-18 LAB — CONVERTED CEMR LAB
BUN: 18 mg/dL (ref 6–23)
Basophils Absolute: 0.1 10*3/uL (ref 0.0–0.1)
Basophils Relative: 1 % (ref 0.0–3.0)
CO2: 31 meq/L (ref 19–32)
Calcium: 8.9 mg/dL (ref 8.4–10.5)
Chloride: 97 meq/L (ref 96–112)
Creatinine, Ser: 1 mg/dL (ref 0.4–1.2)
Eosinophils Absolute: 0.3 10*3/uL (ref 0.0–0.7)
Eosinophils Relative: 3.9 % (ref 0.0–5.0)
GFR calc Af Amer: 70 mL/min
GFR calc non Af Amer: 57 mL/min
Glucose, Bld: 186 mg/dL — ABNORMAL HIGH (ref 70–99)
HCT: 38.6 % (ref 36.0–46.0)
Hemoglobin: 13.3 g/dL (ref 12.0–15.0)
Lymphocytes Relative: 22 % (ref 12.0–46.0)
MCHC: 34.4 g/dL (ref 30.0–36.0)
MCV: 88.7 fL (ref 78.0–100.0)
Monocytes Absolute: 0.6 10*3/uL (ref 0.1–1.0)
Monocytes Relative: 8 % (ref 3.0–12.0)
Neutro Abs: 4.5 10*3/uL (ref 1.4–7.7)
Neutrophils Relative %: 65.1 % (ref 43.0–77.0)
Platelets: 204 10*3/uL (ref 150–400)
Potassium: 4.6 meq/L (ref 3.5–5.1)
Pro B Natriuretic peptide (BNP): 49 pg/mL (ref 0.0–100.0)
RBC: 4.36 M/uL (ref 3.87–5.11)
RDW: 12.7 % (ref 11.5–14.6)
Sodium: 135 meq/L (ref 135–145)
TSH: 4.88 microintl units/mL (ref 0.35–5.50)
WBC: 7 10*3/uL (ref 4.5–10.5)

## 2008-05-26 ENCOUNTER — Encounter: Admission: RE | Admit: 2008-05-26 | Discharge: 2008-05-26 | Payer: Self-pay | Admitting: Neurosurgery

## 2008-06-26 ENCOUNTER — Encounter (HOSPITAL_COMMUNITY): Admission: RE | Admit: 2008-06-26 | Discharge: 2008-08-24 | Payer: Self-pay | Admitting: Internal Medicine

## 2008-07-10 ENCOUNTER — Ambulatory Visit: Payer: Self-pay | Admitting: Internal Medicine

## 2008-07-10 LAB — CONVERTED CEMR LAB
AST: 23 units/L (ref 0–37)
Basophils Absolute: 0.1 10*3/uL (ref 0.0–0.1)
Basophils Relative: 1 % (ref 0.0–3.0)
Eosinophils Absolute: 0.3 10*3/uL (ref 0.0–0.7)
Eosinophils Relative: 3.7 % (ref 0.0–5.0)
HCT: 39.3 % (ref 36.0–46.0)
Hemoglobin: 13.4 g/dL (ref 12.0–15.0)
Lymphocytes Relative: 27.6 % (ref 12.0–46.0)
MCHC: 34.1 g/dL (ref 30.0–36.0)
MCV: 88.5 fL (ref 78.0–100.0)
Monocytes Absolute: 0.7 10*3/uL (ref 0.1–1.0)
Monocytes Relative: 9.5 % (ref 3.0–12.0)
Neutro Abs: 4 10*3/uL (ref 1.4–7.7)
Neutrophils Relative %: 58.2 % (ref 43.0–77.0)
Platelets: 207 10*3/uL (ref 150–400)
RBC: 4.44 M/uL (ref 3.87–5.11)
RDW: 12.2 % (ref 11.5–14.6)
WBC: 7 10*3/uL (ref 4.5–10.5)

## 2008-08-07 ENCOUNTER — Ambulatory Visit: Payer: Self-pay | Admitting: Internal Medicine

## 2008-08-07 LAB — CONVERTED CEMR LAB
Basophils Absolute: 0.1 10*3/uL (ref 0.0–0.1)
Basophils Relative: 1.6 % (ref 0.0–3.0)
Eosinophils Absolute: 0.3 10*3/uL (ref 0.0–0.7)
Eosinophils Relative: 5 % (ref 0.0–5.0)
HCT: 38.4 % (ref 36.0–46.0)
Hemoglobin: 13 g/dL (ref 12.0–15.0)
Lymphocytes Relative: 26.2 % (ref 12.0–46.0)
MCHC: 33.9 g/dL (ref 30.0–36.0)
MCV: 87.6 fL (ref 78.0–100.0)
Monocytes Absolute: 0.5 10*3/uL (ref 0.1–1.0)
Monocytes Relative: 7.7 % (ref 3.0–12.0)
Neutro Abs: 3.7 10*3/uL (ref 1.4–7.7)
Neutrophils Relative %: 59.5 % (ref 43.0–77.0)
Platelets: 186 10*3/uL (ref 150–400)
RBC: 4.38 M/uL (ref 3.87–5.11)
RDW: 12.8 % (ref 11.5–14.6)
WBC: 6.3 10*3/uL (ref 4.5–10.5)

## 2008-08-26 ENCOUNTER — Encounter (HOSPITAL_COMMUNITY): Admission: RE | Admit: 2008-08-26 | Discharge: 2008-11-24 | Payer: Self-pay | Admitting: Internal Medicine

## 2008-09-03 ENCOUNTER — Ambulatory Visit: Payer: Self-pay | Admitting: Internal Medicine

## 2008-09-03 ENCOUNTER — Ambulatory Visit: Payer: Self-pay

## 2008-09-03 LAB — CONVERTED CEMR LAB
Basophils Absolute: 0.1 10*3/uL (ref 0.0–0.1)
Basophils Relative: 1.6 % (ref 0.0–3.0)
Eosinophils Absolute: 0.2 10*3/uL (ref 0.0–0.7)
Eosinophils Relative: 3.4 % (ref 0.0–5.0)
HCT: 38.9 % (ref 36.0–46.0)
Hemoglobin: 13.3 g/dL (ref 12.0–15.0)
Lymphocytes Relative: 22.8 % (ref 12.0–46.0)
MCHC: 34.2 g/dL (ref 30.0–36.0)
MCV: 86.7 fL (ref 78.0–100.0)
Monocytes Absolute: 0.6 10*3/uL (ref 0.1–1.0)
Monocytes Relative: 9.8 % (ref 3.0–12.0)
Neutro Abs: 3.7 10*3/uL (ref 1.4–7.7)
Neutrophils Relative %: 62.4 % (ref 43.0–77.0)
Platelets: 191 10*3/uL (ref 150–400)
RBC: 4.48 M/uL (ref 3.87–5.11)
RDW: 12.8 % (ref 11.5–14.6)
Sed Rate: 20 mm/hr (ref 0–22)
WBC: 6 10*3/uL (ref 4.5–10.5)

## 2008-09-14 ENCOUNTER — Ambulatory Visit: Payer: Self-pay | Admitting: Internal Medicine

## 2008-09-14 ENCOUNTER — Inpatient Hospital Stay (HOSPITAL_COMMUNITY): Admission: EM | Admit: 2008-09-14 | Discharge: 2008-09-15 | Payer: Self-pay | Admitting: Emergency Medicine

## 2008-09-28 ENCOUNTER — Encounter: Payer: Self-pay | Admitting: Internal Medicine

## 2008-09-28 ENCOUNTER — Ambulatory Visit: Payer: Self-pay | Admitting: Internal Medicine

## 2008-10-01 ENCOUNTER — Ambulatory Visit: Payer: Self-pay | Admitting: Internal Medicine

## 2008-10-06 ENCOUNTER — Telehealth: Payer: Self-pay | Admitting: Internal Medicine

## 2008-10-28 ENCOUNTER — Ambulatory Visit: Payer: Self-pay | Admitting: Pulmonary Disease

## 2008-11-17 ENCOUNTER — Ambulatory Visit: Payer: Self-pay

## 2008-11-17 ENCOUNTER — Encounter: Payer: Self-pay | Admitting: Internal Medicine

## 2008-11-19 ENCOUNTER — Ambulatory Visit: Payer: Self-pay | Admitting: Pulmonary Disease

## 2008-11-20 ENCOUNTER — Encounter (INDEPENDENT_AMBULATORY_CARE_PROVIDER_SITE_OTHER): Payer: Self-pay | Admitting: *Deleted

## 2008-11-25 ENCOUNTER — Encounter (HOSPITAL_COMMUNITY): Admission: RE | Admit: 2008-11-25 | Discharge: 2008-12-23 | Payer: Self-pay | Admitting: Internal Medicine

## 2008-11-26 ENCOUNTER — Ambulatory Visit: Payer: Self-pay | Admitting: Internal Medicine

## 2008-11-27 LAB — CONVERTED CEMR LAB
BUN: 14 mg/dL (ref 6–23)
Basophils Absolute: 0 10*3/uL (ref 0.0–0.1)
Basophils Relative: 0.1 % (ref 0.0–3.0)
CO2: 28 meq/L (ref 19–32)
Calcium: 8.4 mg/dL (ref 8.4–10.5)
Chloride: 103 meq/L (ref 96–112)
Creatinine, Ser: 0.9 mg/dL (ref 0.4–1.2)
Eosinophils Absolute: 0.2 10*3/uL (ref 0.0–0.7)
Eosinophils Relative: 3.7 % (ref 0.0–5.0)
GFR calc non Af Amer: 64.72 mL/min (ref >60)
Glucose, Bld: 130 mg/dL — ABNORMAL HIGH (ref 70–99)
HCT: 38.4 % (ref 36.0–46.0)
Hemoglobin: 13 g/dL (ref 12.0–15.0)
INR: 1 (ref 0.8–1.0)
Lymphocytes Relative: 17.6 % (ref 12.0–46.0)
Lymphs Abs: 1.1 10*3/uL (ref 0.7–4.0)
MCHC: 33.9 g/dL (ref 30.0–36.0)
MCV: 89 fL (ref 78.0–100.0)
Monocytes Absolute: 0.6 10*3/uL (ref 0.1–1.0)
Monocytes Relative: 9.6 % (ref 3.0–12.0)
Neutro Abs: 4.1 10*3/uL (ref 1.4–7.7)
Neutrophils Relative %: 69 % (ref 43.0–77.0)
Platelets: 206 10*3/uL (ref 150.0–400.0)
Potassium: 4.2 meq/L (ref 3.5–5.1)
Prothrombin Time: 11.1 s (ref 10.9–13.3)
RBC: 4.32 M/uL (ref 3.87–5.11)
RDW: 12.4 % (ref 11.5–14.6)
Sodium: 137 meq/L (ref 135–145)
WBC: 6 10*3/uL (ref 4.5–10.5)
aPTT: 27.9 s (ref 21.7–28.8)

## 2008-11-30 ENCOUNTER — Ambulatory Visit: Payer: Self-pay | Admitting: Internal Medicine

## 2008-11-30 ENCOUNTER — Inpatient Hospital Stay (HOSPITAL_COMMUNITY): Admission: AD | Admit: 2008-11-30 | Discharge: 2008-11-30 | Payer: Self-pay | Admitting: Cardiology

## 2008-11-30 ENCOUNTER — Inpatient Hospital Stay (HOSPITAL_BASED_OUTPATIENT_CLINIC_OR_DEPARTMENT_OTHER): Admission: RE | Admit: 2008-11-30 | Discharge: 2008-11-30 | Payer: Self-pay | Admitting: Internal Medicine

## 2008-12-04 ENCOUNTER — Encounter: Payer: Self-pay | Admitting: Internal Medicine

## 2008-12-04 ENCOUNTER — Inpatient Hospital Stay (HOSPITAL_COMMUNITY): Admission: RE | Admit: 2008-12-04 | Discharge: 2008-12-05 | Payer: Self-pay | Admitting: Cardiology

## 2008-12-04 ENCOUNTER — Ambulatory Visit: Payer: Self-pay | Admitting: Cardiology

## 2008-12-10 ENCOUNTER — Encounter: Payer: Self-pay | Admitting: Internal Medicine

## 2008-12-10 ENCOUNTER — Telehealth: Payer: Self-pay | Admitting: Cardiology

## 2008-12-16 ENCOUNTER — Encounter: Payer: Self-pay | Admitting: Cardiovascular Disease

## 2008-12-24 ENCOUNTER — Encounter (HOSPITAL_COMMUNITY): Admission: RE | Admit: 2008-12-24 | Discharge: 2009-03-24 | Payer: Self-pay | Admitting: Internal Medicine

## 2008-12-25 ENCOUNTER — Ambulatory Visit: Payer: Self-pay | Admitting: Internal Medicine

## 2009-01-13 ENCOUNTER — Encounter: Payer: Self-pay | Admitting: Cardiovascular Disease

## 2009-01-15 ENCOUNTER — Emergency Department (HOSPITAL_COMMUNITY): Admission: EM | Admit: 2009-01-15 | Discharge: 2009-01-15 | Payer: Self-pay | Admitting: Emergency Medicine

## 2009-02-02 ENCOUNTER — Ambulatory Visit: Payer: Self-pay | Admitting: Internal Medicine

## 2009-02-04 ENCOUNTER — Telehealth: Payer: Self-pay | Admitting: Pulmonary Disease

## 2009-02-04 ENCOUNTER — Encounter: Payer: Self-pay | Admitting: Internal Medicine

## 2009-02-18 ENCOUNTER — Ambulatory Visit: Payer: Self-pay | Admitting: Pulmonary Disease

## 2009-02-18 DIAGNOSIS — J8489 Other specified interstitial pulmonary diseases: Secondary | ICD-10-CM | POA: Insufficient documentation

## 2009-02-22 ENCOUNTER — Ambulatory Visit: Admission: RE | Admit: 2009-02-22 | Discharge: 2009-02-22 | Payer: Self-pay | Admitting: Pulmonary Disease

## 2009-02-22 ENCOUNTER — Encounter: Payer: Self-pay | Admitting: Pulmonary Disease

## 2009-02-22 ENCOUNTER — Ambulatory Visit: Payer: Self-pay | Admitting: Pulmonary Disease

## 2009-02-22 HISTORY — PX: BRONCHOSCOPY: SUR163

## 2009-02-23 ENCOUNTER — Telehealth: Payer: Self-pay | Admitting: Pulmonary Disease

## 2009-03-09 ENCOUNTER — Ambulatory Visit: Payer: Self-pay | Admitting: Pulmonary Disease

## 2009-03-26 ENCOUNTER — Encounter (HOSPITAL_COMMUNITY): Admission: RE | Admit: 2009-03-26 | Discharge: 2009-06-23 | Payer: Self-pay | Admitting: Internal Medicine

## 2009-03-30 ENCOUNTER — Ambulatory Visit: Payer: Self-pay | Admitting: Pulmonary Disease

## 2009-03-30 LAB — CONVERTED CEMR LAB
ALT: 19 units/L (ref 0–35)
ANA Titer 1: 1:40 {titer} — ABNORMAL HIGH
AST: 22 units/L (ref 0–37)
Albumin: 3.8 g/dL (ref 3.5–5.2)
Alkaline Phosphatase: 80 units/L (ref 39–117)
Angiotensin 1 Converting Enzyme: 30 units/L (ref 9–67)
Anti Nuclear Antibody(ANA): POSITIVE — AB
BUN: 16 mg/dL (ref 6–23)
Basophils Absolute: 0.1 10*3/uL (ref 0.0–0.1)
Basophils Relative: 1.4 % (ref 0.0–3.0)
Bilirubin, Direct: 0.1 mg/dL (ref 0.0–0.3)
CO2: 30 meq/L (ref 19–32)
CRP, High Sensitivity: 5 (ref 0.00–5.00)
Calcium: 8.7 mg/dL (ref 8.4–10.5)
Chloride: 102 meq/L (ref 96–112)
Creatinine, Ser: 0.8 mg/dL (ref 0.4–1.2)
Eosinophils Absolute: 0.3 10*3/uL (ref 0.0–0.7)
Eosinophils Relative: 4.9 % (ref 0.0–5.0)
GFR calc non Af Amer: 74.08 mL/min (ref >60)
Glucose, Bld: 155 mg/dL — ABNORMAL HIGH (ref 70–99)
HCT: 35.2 % — ABNORMAL LOW (ref 36.0–46.0)
Hemoglobin: 12 g/dL (ref 12.0–15.0)
Lymphocytes Relative: 26.7 % (ref 12.0–46.0)
Lymphs Abs: 1.8 10*3/uL (ref 0.7–4.0)
MCHC: 33.9 g/dL (ref 30.0–36.0)
MCV: 86.7 fL (ref 78.0–100.0)
Monocytes Absolute: 0.5 10*3/uL (ref 0.1–1.0)
Monocytes Relative: 7 % (ref 3.0–12.0)
Neutro Abs: 3.9 10*3/uL (ref 1.4–7.7)
Neutrophils Relative %: 60 % (ref 43.0–77.0)
Platelets: 178 10*3/uL (ref 150.0–400.0)
Potassium: 4 meq/L (ref 3.5–5.1)
Pro B Natriuretic peptide (BNP): 45 pg/mL (ref 0.0–100.0)
RBC: 4.06 M/uL (ref 3.87–5.11)
RDW: 13.2 % (ref 11.5–14.6)
Rhuematoid fact SerPl-aCnc: 21.4 intl units/mL — ABNORMAL HIGH (ref 0.0–20.0)
Sed Rate: 20 mm/hr (ref 0–22)
Sodium: 137 meq/L (ref 135–145)
Total Bilirubin: 0.4 mg/dL (ref 0.3–1.2)
Total Protein: 6.5 g/dL (ref 6.0–8.3)
WBC: 6.6 10*3/uL (ref 4.5–10.5)

## 2009-04-09 ENCOUNTER — Ambulatory Visit: Payer: Self-pay | Admitting: Internal Medicine

## 2009-04-20 ENCOUNTER — Encounter: Payer: Self-pay | Admitting: Internal Medicine

## 2009-05-25 ENCOUNTER — Ambulatory Visit: Payer: Self-pay | Admitting: Internal Medicine

## 2009-05-25 DIAGNOSIS — E7800 Pure hypercholesterolemia, unspecified: Secondary | ICD-10-CM | POA: Insufficient documentation

## 2009-05-28 ENCOUNTER — Encounter: Payer: Self-pay | Admitting: Internal Medicine

## 2009-05-31 LAB — CONVERTED CEMR LAB
Cholesterol: 224 mg/dL — ABNORMAL HIGH (ref 0–200)
Direct LDL: 165.3 mg/dL
HDL: 43.4 mg/dL (ref >39.00)
Total CHOL/HDL Ratio: 5
Triglycerides: 103 mg/dL (ref 0.0–149.0)
VLDL: 20.6 mg/dL (ref 0.0–40.0)

## 2009-06-16 ENCOUNTER — Ambulatory Visit: Payer: Self-pay | Admitting: Pulmonary Disease

## 2009-06-16 ENCOUNTER — Encounter: Payer: Self-pay | Admitting: Pulmonary Disease

## 2009-06-29 ENCOUNTER — Ambulatory Visit: Payer: Self-pay | Admitting: Gastroenterology

## 2009-06-29 DIAGNOSIS — M349 Systemic sclerosis, unspecified: Secondary | ICD-10-CM | POA: Insufficient documentation

## 2009-06-29 DIAGNOSIS — I219 Acute myocardial infarction, unspecified: Secondary | ICD-10-CM | POA: Insufficient documentation

## 2009-06-29 DIAGNOSIS — R1319 Other dysphagia: Secondary | ICD-10-CM | POA: Insufficient documentation

## 2009-07-09 ENCOUNTER — Ambulatory Visit: Payer: Self-pay | Admitting: Pulmonary Disease

## 2009-07-14 ENCOUNTER — Ambulatory Visit: Payer: Self-pay | Admitting: Gastroenterology

## 2009-07-16 ENCOUNTER — Telehealth: Payer: Self-pay | Admitting: Pulmonary Disease

## 2009-08-03 ENCOUNTER — Ambulatory Visit: Payer: Self-pay | Admitting: Pulmonary Disease

## 2009-08-04 LAB — CONVERTED CEMR LAB
ALT: 25 units/L (ref 0–35)
AST: 18 units/L (ref 0–37)
Albumin: 3.8 g/dL (ref 3.5–5.2)
Alkaline Phosphatase: 72 units/L (ref 39–117)
BUN: 15 mg/dL (ref 6–23)
Basophils Absolute: 0 10*3/uL (ref 0.0–0.1)
Basophils Relative: 0.4 % (ref 0.0–3.0)
Bilirubin, Direct: 0 mg/dL (ref 0.0–0.3)
CO2: 33 meq/L — ABNORMAL HIGH (ref 19–32)
Calcium: 8.9 mg/dL (ref 8.4–10.5)
Chloride: 97 meq/L (ref 96–112)
Creatinine, Ser: 1 mg/dL (ref 0.4–1.2)
Eosinophils Absolute: 0.1 10*3/uL (ref 0.0–0.7)
Eosinophils Relative: 1.2 % (ref 0.0–5.0)
GFR calc non Af Amer: 57.21 mL/min (ref >60)
Glucose, Bld: 203 mg/dL — ABNORMAL HIGH (ref 70–99)
HCT: 38.5 % (ref 36.0–46.0)
Hemoglobin: 12.6 g/dL (ref 12.0–15.0)
Lymphocytes Relative: 13.4 % (ref 12.0–46.0)
Lymphs Abs: 1.5 10*3/uL (ref 0.7–4.0)
MCHC: 32.7 g/dL (ref 30.0–36.0)
MCV: 89 fL (ref 78.0–100.0)
Monocytes Absolute: 0.6 10*3/uL (ref 0.1–1.0)
Monocytes Relative: 5.2 % (ref 3.0–12.0)
Neutro Abs: 8.8 10*3/uL — ABNORMAL HIGH (ref 1.4–7.7)
Neutrophils Relative %: 79.8 % — ABNORMAL HIGH (ref 43.0–77.0)
Platelets: 242 10*3/uL (ref 150.0–400.0)
Potassium: 3.8 meq/L (ref 3.5–5.1)
RBC: 4.33 M/uL (ref 3.87–5.11)
RDW: 12.8 % (ref 11.5–14.6)
Sodium: 139 meq/L (ref 135–145)
Total Bilirubin: 0.3 mg/dL (ref 0.3–1.2)
Total Protein: 6.8 g/dL (ref 6.0–8.3)
WBC: 11 10*3/uL — ABNORMAL HIGH (ref 4.5–10.5)

## 2009-08-10 ENCOUNTER — Telehealth (INDEPENDENT_AMBULATORY_CARE_PROVIDER_SITE_OTHER): Payer: Self-pay | Admitting: *Deleted

## 2009-08-19 ENCOUNTER — Encounter (INDEPENDENT_AMBULATORY_CARE_PROVIDER_SITE_OTHER): Payer: Self-pay | Admitting: *Deleted

## 2009-09-03 ENCOUNTER — Ambulatory Visit: Payer: Self-pay | Admitting: Gastroenterology

## 2009-09-14 ENCOUNTER — Ambulatory Visit: Payer: Self-pay | Admitting: Pulmonary Disease

## 2009-09-16 LAB — CONVERTED CEMR LAB
ALT: 18 units/L (ref 0–35)
AST: 19 units/L (ref 0–37)
Albumin: 3.9 g/dL (ref 3.5–5.2)
Alkaline Phosphatase: 60 units/L (ref 39–117)
BUN: 14 mg/dL (ref 6–23)
Basophils Absolute: 0.1 10*3/uL (ref 0.0–0.1)
Basophils Relative: 1.5 % (ref 0.0–3.0)
Bilirubin, Direct: 0 mg/dL (ref 0.0–0.3)
CO2: 29 meq/L (ref 19–32)
Calcium: 8.8 mg/dL (ref 8.4–10.5)
Chloride: 100 meq/L (ref 96–112)
Creatinine, Ser: 0.9 mg/dL (ref 0.4–1.2)
Eosinophils Absolute: 0.1 10*3/uL (ref 0.0–0.7)
Eosinophils Relative: 2.2 % (ref 0.0–5.0)
GFR calc non Af Amer: 64.58 mL/min (ref >60)
Glucose, Bld: 95 mg/dL (ref 70–99)
HCT: 37 % (ref 36.0–46.0)
Hemoglobin: 12.3 g/dL (ref 12.0–15.0)
Lymphocytes Relative: 30.8 % (ref 12.0–46.0)
Lymphs Abs: 1.8 10*3/uL (ref 0.7–4.0)
MCHC: 33.4 g/dL (ref 30.0–36.0)
MCV: 88.8 fL (ref 78.0–100.0)
Monocytes Absolute: 0.5 10*3/uL (ref 0.1–1.0)
Monocytes Relative: 8.5 % (ref 3.0–12.0)
Neutro Abs: 3.5 10*3/uL (ref 1.4–7.7)
Neutrophils Relative %: 57 % (ref 43.0–77.0)
Platelets: 203 10*3/uL (ref 150.0–400.0)
Potassium: 4.5 meq/L (ref 3.5–5.1)
RBC: 4.16 M/uL (ref 3.87–5.11)
RDW: 14.1 % (ref 11.5–14.6)
Sed Rate: 28 mm/hr — ABNORMAL HIGH (ref 0–22)
Sodium: 137 meq/L (ref 135–145)
Total Bilirubin: 0.4 mg/dL (ref 0.3–1.2)
Total Protein: 6.9 g/dL (ref 6.0–8.3)
WBC: 6 10*3/uL (ref 4.5–10.5)

## 2009-10-01 ENCOUNTER — Telehealth: Payer: Self-pay | Admitting: Internal Medicine

## 2009-10-21 ENCOUNTER — Ambulatory Visit: Payer: Self-pay | Admitting: Internal Medicine

## 2009-10-25 ENCOUNTER — Ambulatory Visit: Payer: Self-pay | Admitting: Pulmonary Disease

## 2009-10-28 ENCOUNTER — Encounter: Admission: RE | Admit: 2009-10-28 | Discharge: 2009-10-28 | Payer: Self-pay | Admitting: Gastroenterology

## 2009-11-01 ENCOUNTER — Encounter: Payer: Self-pay | Admitting: Internal Medicine

## 2009-11-01 ENCOUNTER — Telehealth: Payer: Self-pay | Admitting: Internal Medicine

## 2009-11-01 LAB — CONVERTED CEMR LAB
AST: 21 units/L (ref 0–37)
BUN: 11 mg/dL (ref 6–23)
Basophils Absolute: 0.1 10*3/uL (ref 0.0–0.1)
Basophils Relative: 0.8 % (ref 0.0–3.0)
CO2: 32 meq/L (ref 19–32)
Calcium: 8.7 mg/dL (ref 8.4–10.5)
Chloride: 97 meq/L (ref 96–112)
Cholesterol: 222 mg/dL — ABNORMAL HIGH (ref 0–200)
Creatinine, Ser: 0.9 mg/dL (ref 0.4–1.2)
Direct LDL: 136.7 mg/dL
Eosinophils Absolute: 0.2 10*3/uL (ref 0.0–0.7)
Eosinophils Relative: 2.6 % (ref 0.0–5.0)
GFR calc non Af Amer: 64.57 mL/min (ref >60)
Glucose, Bld: 187 mg/dL — ABNORMAL HIGH (ref 70–99)
HCT: 37.2 % (ref 36.0–46.0)
HDL: 61.2 mg/dL (ref >39.00)
Hemoglobin: 12.6 g/dL (ref 12.0–15.0)
Lymphocytes Relative: 17.9 % (ref 12.0–46.0)
Lymphs Abs: 1.2 10*3/uL (ref 0.7–4.0)
MCHC: 33.9 g/dL (ref 30.0–36.0)
MCV: 89.5 fL (ref 78.0–100.0)
Monocytes Absolute: 0.3 10*3/uL (ref 0.1–1.0)
Monocytes Relative: 5 % (ref 3.0–12.0)
Neutro Abs: 5.1 10*3/uL (ref 1.4–7.7)
Neutrophils Relative %: 73.7 % (ref 43.0–77.0)
Platelets: 223 10*3/uL (ref 150.0–400.0)
Potassium: 4.3 meq/L (ref 3.5–5.1)
RBC: 4.16 M/uL (ref 3.87–5.11)
RDW: 14.9 % — ABNORMAL HIGH (ref 11.5–14.6)
Sodium: 136 meq/L (ref 135–145)
Total CHOL/HDL Ratio: 4
Triglycerides: 206 mg/dL — ABNORMAL HIGH (ref 0.0–149.0)
VLDL: 41.2 mg/dL — ABNORMAL HIGH (ref 0.0–40.0)
WBC: 6.9 10*3/uL (ref 4.5–10.5)

## 2009-11-04 ENCOUNTER — Encounter: Payer: Self-pay | Admitting: Internal Medicine

## 2009-11-15 ENCOUNTER — Encounter (INDEPENDENT_AMBULATORY_CARE_PROVIDER_SITE_OTHER): Payer: Self-pay | Admitting: *Deleted

## 2009-11-18 ENCOUNTER — Encounter (INDEPENDENT_AMBULATORY_CARE_PROVIDER_SITE_OTHER): Payer: Self-pay | Admitting: *Deleted

## 2009-11-18 ENCOUNTER — Ambulatory Visit: Payer: Self-pay | Admitting: Gastroenterology

## 2009-11-18 ENCOUNTER — Telehealth (INDEPENDENT_AMBULATORY_CARE_PROVIDER_SITE_OTHER): Payer: Self-pay | Admitting: *Deleted

## 2009-11-19 ENCOUNTER — Ambulatory Visit: Payer: Self-pay | Admitting: Gastroenterology

## 2009-11-23 ENCOUNTER — Telehealth: Payer: Self-pay | Admitting: Internal Medicine

## 2009-11-26 ENCOUNTER — Ambulatory Visit (HOSPITAL_COMMUNITY): Admission: RE | Admit: 2009-11-26 | Discharge: 2009-11-26 | Payer: Self-pay | Admitting: Gastroenterology

## 2009-11-26 ENCOUNTER — Ambulatory Visit: Payer: Self-pay | Admitting: Gastroenterology

## 2009-12-06 ENCOUNTER — Encounter (HOSPITAL_COMMUNITY): Admission: RE | Admit: 2009-12-06 | Discharge: 2010-02-24 | Payer: Self-pay | Admitting: Internal Medicine

## 2009-12-14 ENCOUNTER — Encounter: Admission: RE | Admit: 2009-12-14 | Discharge: 2009-12-14 | Payer: Self-pay | Admitting: Neurosurgery

## 2010-01-07 ENCOUNTER — Ambulatory Visit: Payer: Self-pay | Admitting: Cardiology

## 2010-01-07 ENCOUNTER — Inpatient Hospital Stay (HOSPITAL_COMMUNITY): Admission: EM | Admit: 2010-01-07 | Discharge: 2010-01-08 | Payer: Self-pay | Admitting: Emergency Medicine

## 2010-02-16 ENCOUNTER — Emergency Department (HOSPITAL_COMMUNITY): Admission: EM | Admit: 2010-02-16 | Discharge: 2010-02-17 | Payer: Self-pay | Admitting: Emergency Medicine

## 2010-02-22 ENCOUNTER — Telehealth: Payer: Self-pay | Admitting: Gastroenterology

## 2010-02-25 ENCOUNTER — Encounter: Payer: Self-pay | Admitting: Internal Medicine

## 2010-03-02 ENCOUNTER — Encounter: Payer: Self-pay | Admitting: Pulmonary Disease

## 2010-03-10 ENCOUNTER — Encounter (INDEPENDENT_AMBULATORY_CARE_PROVIDER_SITE_OTHER): Payer: Self-pay | Admitting: *Deleted

## 2010-03-15 ENCOUNTER — Ambulatory Visit: Payer: Self-pay | Admitting: Gastroenterology

## 2010-03-24 ENCOUNTER — Ambulatory Visit: Payer: Self-pay | Admitting: Internal Medicine

## 2010-03-24 DIAGNOSIS — R0602 Shortness of breath: Secondary | ICD-10-CM | POA: Insufficient documentation

## 2010-03-25 LAB — CONVERTED CEMR LAB
Bilirubin Urine: NEGATIVE
Hemoglobin, Urine: NEGATIVE
Ketones, ur: NEGATIVE mg/dL
Leukocytes, UA: NEGATIVE
Nitrite: NEGATIVE
Specific Gravity, Urine: 1.01 (ref 1.000–1.030)
Total Protein, Urine: NEGATIVE mg/dL
Urine Glucose: NEGATIVE mg/dL
Urobilinogen, UA: 0.2 (ref 0.0–1.0)
pH: 6 (ref 5.0–8.0)

## 2010-05-09 ENCOUNTER — Ambulatory Visit: Payer: Self-pay | Admitting: Pulmonary Disease

## 2010-05-09 ENCOUNTER — Encounter: Payer: Self-pay | Admitting: Pulmonary Disease

## 2010-06-23 ENCOUNTER — Ambulatory Visit
Admission: RE | Admit: 2010-06-23 | Discharge: 2010-06-23 | Payer: Self-pay | Source: Home / Self Care | Attending: Pulmonary Disease | Admitting: Pulmonary Disease

## 2010-06-23 ENCOUNTER — Ambulatory Visit: Payer: Self-pay | Admitting: Pulmonary Disease

## 2010-07-17 ENCOUNTER — Encounter: Payer: Self-pay | Admitting: Neurosurgery

## 2010-07-26 NOTE — Assessment & Plan Note (Signed)
Summary: ROV//MBW   CC:  Pt here for HFU from 08/2008 and lung nodules seen on CXR.[name]  History of Present Illness: I saw Ms. Ivey for evaluation of her abnormal CT chest.  I had seen Ms. Enck previously for dyspnea and abnormal CT chest.  She had fleeting pulmonary infiltrates which has improved on follow up imaging studies.  Her pulmonary function tests were unrevealing then.  She was found to have esophageal dysmotility and scleroderma.  She was seen by rheumatology.  She continues to have problems with dyspnea.  Over the past year she has suffered two myocardial infarctions.  She had stenting of her coronary arteries, but this did not improve her respiratory symptoms.    She does not have much cough, and denies wheezing.  She denies sputum production or hemoptysis.  She has not been having fever.  Her chest pain symptoms resolved after her coronary stenting.  Her weight has been stable.  There is no history of TB.  She stopped smoking several years ago, and has a 40 pack year history of smoking.  She was prescribed inhalers previously, but never really used them.  She has been having sinus congestion, yellow nasal drainage, and frontal headache.  Of note is that she had right heart catheterization done in 2005 which showed evidence for pulmonary hypertension.  This was again seen on Echo from 2009.   CT chest from October 01, 2008:  Centrilobular emphysema.  Subpleural nodularity in right major fissure.  Multiple RLL nodules, largest 1 x 0.8 cm.  Left lung clear.          SPIROMETRY RESULTS  Date:10/28/2008 height inches: 63 Gender: Female  Parameter  Measured Predicted %Predicted  FVC      2.51        2.63      95  FEV1      1.68        1.96      86  FEV1/FVC      0.67        0.75      89  FEF25-75      0.99        1.66      60  PEF              355      0  Post-Bronchodilator  Parameter Measured Change from Baseline  FVC               FEV1               FEV1/FVC                 FEF25-75                PEF               Pulse Oximetry Pulse: 92 O2 Saturation %: 95  Comments: Mild obstruction.   Current Medications (verified): 1)  Nasonex 50 Mcg/act  Susp (Mometasone Furoate) .... 2 Sprays Each Nostril Once Daily 2)  Ocuvite Extra   Tabs (Multiple Vitamins-Minerals) .... Two Times A Day 3)  Zinc 50 Mg  Tabs (Zinc) .... .qdtab 4)  Imipramine Hcl 25 Mg  Tabs (Imipramine Hcl) .... Three Times A Day 5)  Synthroid 125 Mcg Tabs (Levothyroxine Sodium) .... One By Mouth Daily 6)  Lantus 80 Unit/ml  Soln (Insulin Glargine) .... Every Day 7)  Mucinex 600 Mg  Tb12 (Guaifenesin) .... Take 1 Tablet By Mouth  Once A Day 8)  Nitro-Dur 0.4 Mg/hr  Pt24 (Nitroglycerin) .... Take 1 Tablet Under Tongue As Needed For Chest Pain. 9)  Pravachol 20 Mg Tabs (Pravastatin Sodium) .... One By Mouth Daily 10)  Ticlopidine Hcl 250 Mg  Tabs (Ticlopidine Hcl) .... Take One Tablet Two Times A Day 11)  Sennacot .... As Needed 12)  Nexium 40 Mg Cpdr (Esomeprazole Magnesium) .... One By Mouth Daily 13)  Asprin 325mg  .... 2 By Mouth Daily 14)  Halcyon .... As Needed  Allergies (verified): 1)  ! Lopressor Hct (Metoprolol-Hydrochlorothiazide) 2)  ! Pcn 3)  ! Sulfa  Clinical Review Panels:  PFT's   FVC:  2.51 (10/28/2008)   FVC %Expect:  95 (10/28/2008)   FEV1:  1.68 (10/28/2008)   FEV1 %Expect:  86 (10/28/2008)   FEF 25-75%:  0.99 (10/28/2008)   FVC Post Spir.:  2.38 (07/08/2007)   Post FEV1:  1.81 (07/08/2007)   Post FEF 25/75%:  1.47 (07/08/2007)  Sarcoid Panel  ACE Level:  35 (05/28/2007) ESR:  20 (09/03/2008) CXR Results:  Findings: As seen on the patient's most recent chest film, there is   a focal nodular opacity the right mid lung.  Pulmonary interstitium   appears somewhat coarsened.  No consolidative process.  No   effusion.  Heart size normal.    IMPRESSION:   Persistent nodular passed in the right mid lung.  Recommend chest   CT for further evaluation.  (09/15/2008)    Past History:  Past Medical History:    Coronary artery disease    Dressler's Syndrome    Hypertension    Dyslipidemia    Diabetes mellitus    CVA 1991    Macular degeneration, legally blind    Depression    GERD    Diverticulosis    Irritable bowel syndrome    Zenker's diverticulum    Pneumonia as a child    Tobacco abuse         - Quit 1999, smoked 1 pack per day for 40 years    Emphysema         - 10/28/08 spirometry FVC 2.51 (96%), FEV1 1.68 (85%), FEV1 67    Pulmonary hypertension         - 07/25/03 right heart cath PA 35/18, PAOP 16         - 02/18/08 Echo PAS 30 mm Hg    Chronic sinusitis    Fibromyalgia    Hypothyroidism    Scleroderma followed by Dr. Dareen Piano    Raynauds phenomenon    Cervical disk disease  Past Surgical History:    Total abdominal hysterectomy 1973    Bilateral oophorectomy 2002    Spinal fusion and implantation of a plate in 8413    Removal of left lobe thyroid 1991    Cholecystectomy 1965    C3-C4 fusion 10/15/02 Dr. Trey Sailors    Appendectomy    Cataract  Social History:    Divorced    Quit tobacco 1999 (40 pack years)    No ETOH  Vital Signs:  Patient profile:   77 year old female Height:      63 inches (160.02 cm) Weight:      154 pounds (70 kg) BMI:     27.38 O2 Sat:      95 % Temp:     98.7 degrees F (37.06 degrees C) oral Pulse rate:   92 / minute BP sitting:   124 / 70  (left arm) Cuff size:  regular  Vitals Entered By: Michel Bickers CMA (Oct 28, 2008 1:56 PM)  O2 Sat at Rest %:  95 O2 Flow:  room air  Physical Exam  General:  normal appearance and healthy appearing.   Nose:  tender over maxillary sinuses, clear nasal discharge Mouth:  mild erythema Neck:  no JVD.   Lungs:  coarse breath sounds, but no wheezing or rales Heart:  regular rhythm, normal rate, and no murmurs.   Abdomen:  Bowel sounds positive; abdomen soft and non-tender without masses, organomegaly, or hernias noted. No  hepatosplenomegaly. Extremities:  tightening of skin over fingers Cervical Nodes:  no significant adenopathy   Pre-Spirometry FVC    Value: 2.51 L/min   Pred: 2.63 L/min     % Pred: 95 % FEV1    Value: 1.68 L     Pred: 1.96 L     % Pred: 86 % FEV1/FVC  Value: 0.67 %     Pred: 0.75 %    FEF 25-75  Value: 0.99 L/min   Pred: 1.66 L/min     Impression & Recommendations:  Problem # 1:  DYSPNEA (ICD-786.05) She does have mild airflow obstruction on her spirometry with an extensive prior history of smoking.  I will start on spiriva.    She also has a history of scleroderma, and prior evidence for pulmonary hypertension.  However, her PA pressures were only mildly elevated before.  Will discuss with cardiology if follow up Echo is warranted, and whether consideration should be given to repeat right heart catheterization.  Problem # 2:  PULMONARY NODULE (ICD-518.89) She has multiple nodules on her CT chest which are stable to slightly increased.  I have recommend repeat CT chest in 3 months.  If there is any further progression, then she will need to have a PET scan and possibly lung tissue sampling.  Problem # 3:  SINUSITIS, ACUTE (ICD-461.9) I will treat her acute sinusitis with zithromax.  She is to continue on her sinus regimen.  Problem # 4:  SYSTEMIC SCLEROSIS (ICD-710.1) Follow up with Dr. Dareen Piano with rheumatology.  She states that she is on amlodipine for circulation in her arms and legs per rheumatology.  Medications Added to Medication List This Visit: 1)  Spiriva Handihaler 18 Mcg Caps (Tiotropium bromide monohydrate) .... One puff once daily 2)  Amlodipine Besylate 5 Mg Tabs (Amlodipine besylate) .... One by mouth once daily 3)  Zithromax Z-pak 250 Mg Tabs (Azithromycin) .... Use as directed  Complete Medication List: 1)  Spiriva Handihaler 18 Mcg Caps (Tiotropium bromide monohydrate) .... One puff once daily 2)  Nasonex 50 Mcg/act Susp (Mometasone furoate) .... 2 sprays each  nostril once daily 3)  Imipramine Hcl 25 Mg Tabs (Imipramine hcl) .... Three times a day 4)  Synthroid 125 Mcg Tabs (Levothyroxine sodium) .... One by mouth daily 5)  Lantus 80 Unit/ml Soln (insulin Glargine)  .... Every day 6)  Nitro-dur 0.4 Mg/hr Pt24 (Nitroglycerin) .... Take 1 tablet under tongue as needed for chest pain. 7)  Pravachol 20 Mg Tabs (Pravastatin sodium) .... One by mouth daily 8)  Ticlopidine Hcl 250 Mg Tabs (Ticlopidine hcl) .... Take one tablet two times a day 9)  Amlodipine Besylate 5 Mg Tabs (Amlodipine besylate) .... One by mouth once daily 10)  Asprin 325mg   .... 2 by mouth daily 11)  Nexium 40 Mg Cpdr (Esomeprazole magnesium) .... One by mouth daily 12)  Zinc 50 Mg Tabs (Zinc) .... .qdtab 13)  Ocuvite Extra Tabs (  Multiple vitamins-minerals) .... Two times a day 14)  Mucinex 600 Mg Tb12 (Guaifenesin) .... Take 1 tablet by mouth once a day 15)  Sennacot  .... As needed 16)  Halcyon  .... As needed 17)  Zithromax Z-pak 250 Mg Tabs (Azithromycin) .... Use as directed  Other Orders: Est. Patient Level IV (78295) Spirometry w/Graph (62130) Radiology Referral (Radiology)  Patient Instructions: 1)  Zithromax for 5 days 2)  Spiriva one puff once daily  3)  Follow up in 3 to 4 weeks Prescriptions: ZITHROMAX Z-PAK 250 MG TABS (AZITHROMYCIN) use as directed  #1 x 0   Entered and Authorized by:   Coralyn Helling MD   Signed by:   Coralyn Helling MD on 10/28/2008   Method used:   Print then Give to Patient   RxID:   [id number] SPIRIVA HANDIHALER 18 MCG CAPS (TIOTROPIUM BROMIDE MONOHYDRATE) one puff once daily  #30 x 3   Entered and Authorized by:   Coralyn Helling MD   Signed by:   Coralyn Helling MD on 10/28/2008   Method used:   Print then Give to Patient   RxID:   [id number]   Appended Document: ROV//MBW Set up for another echo to evaluate PAP, filling parameters of LV.

## 2010-07-26 NOTE — Progress Notes (Signed)
Summary: TEST RESULT  Medications Added COLCHICINE 0.6 MG TABS (COLCHICINE)        Phone Note Call from Patient Call back at Home Phone [phone]   Caller: Patient Reason for Call: Talk to Nurse, Lab or Test Results Summary of Call: CT ON THE LUNG DONE 4/8 Initial call taken by: Lorne Skeens,  October 06, 2008 10:50 AM  Follow-up for Phone Call        Spoke with Dr. Tenny Craw re CT of chest , she will call patient     Follow-up by: Layne Benton, RN, BSN,  October 06, 2008 5:07 PM  Additional Follow-up for Phone Call Additional follow up Details #1::        Phone Call Completed, Appt Scheduled    New/Updated Medications: COLCHICINE 0.6 MG TABS (COLCHICINE)

## 2010-07-26 NOTE — Assessment & Plan Note (Signed)
Summary: 2 weeks/ mbw   Copy to:  Dietrich Pates, Azzie Roup Primary Provider/Referring Provider:  Dr. Nicholos Johns  CC:  2 wk follow up to discuss test results.  states she is still running a low grade fever in the evenings..  History of Present Illness: I saw [name] for evaluation of her abnormal CT chest in the setting of COPD/emphysema, Scleroderma, possible pulmonary hypertension.  Bronchoscopy cultures have been negative to date.  One week remains before AFB culture is officially negative.  She still has a dry cough, and low grade fever in the evening.  She finished her course of antibiotics, but did not notice any improvement.  She was previously on prednisone, and felt that this helped.  CT chest from October 01, 2008:  Centrilobular emphysema.  Subpleural nodularity in right major fissure.  Multiple RLL nodules, largest 1 x 0.8 cm.  Left lung clear.          Current Medications (verified): 1)  Nasonex 50 Mcg/act  Susp (Mometasone Furoate) .... 2 Sprays Each Nostril Once Daily 2)  Imipramine Hcl 25 Mg  Tabs (Imipramine Hcl) .... Three Times A Day 3)  Synthroid 125 Mcg Tabs (Levothyroxine Sodium) .... One By Mouth Daily 4)  Lantus 80 Unit/ml  Soln (Insulin Glargine) .... Every Day 5)  Nitro-Dur 0.4 Mg/hr  Pt24 (Nitroglycerin) .... Take 1 Tablet Under Tongue As Needed For Chest Pain. 6)  Pravachol 20 Mg Tabs (Pravastatin Sodium) .... One By Mouth Daily 7)  Ticlopidine Hcl 250 Mg  Tabs (Ticlopidine Hcl) .... Take One Tablet Two Times A Day 8)  Amlodipine Besylate 10 Mg Tabs (Amlodipine Besylate) .... Once Daily 9)  Asprin 325mg  .... 2 By Mouth Daily 10)  Nexium 40 Mg Cpdr (Esomeprazole Magnesium) .... One By Mouth Daily 11)  Ocuvite Extra   Tabs (Multiple Vitamins-Minerals) .... Two Times A Day 12)  Mucinex 600 Mg  Tb12 (Guaifenesin) .... Take 1 Tablet By Mouth Once A Day As Needed 13)  Sennacot .... As Needed 14)  Halcyon .... As Needed  Allergies (verified): 1)  !  Lopressor Hct (Metoprolol-Hydrochlorothiazide) 2)  ! Pcn 3)  ! Sulfa  Past History:  Past Medical History: Reviewed history from 10/28/2008 and no changes required. Coronary artery disease Dressler's Syndrome Hypertension Dyslipidemia Diabetes mellitus CVA 1991 Macular degeneration, legally blind Depression GERD Diverticulosis Irritable bowel syndrome Zenker's diverticulum Pneumonia as a child Tobacco abuse      - Quit 1999, smoked 1 pack per day for 40 years Emphysema      - 10/28/08 spirometry FVC 2.51 (96%), FEV1 1.68 (85%), FEV1 67 Pulmonary hypertension      - 07/25/03 right heart cath PA 35/18, PAOP 16      - 02/18/08 Echo PAS 30 mm Hg Chronic sinusitis Fibromyalgia Hypothyroidism Scleroderma followed by Dr. Dareen Piano Raynauds phenomenon Cervical disk disease  Past Surgical History: Reviewed history from 03/09/2009 and no changes required. Total abdominal hysterectomy 1973 Bilateral oophorectomy 2002 Spinal fusion and implantation of a plate in 1660 Removal of left lobe thyroid 1991 Cholecystectomy 1965 C3-C4 fusion 10/15/02 Dr. Trey Sailors Appendectomy Cataract Bronchoscopy 02/22/09 Dr. Coralyn Helling  Family History: Reviewed history from 11/27/2007 and no changes required. mother died at age 55 from sepsis father died at age 21 from stroke  Social History: Reviewed history from 10/28/2008 and no changes required. Divorced Quit tobacco 1999 (40 pack years) No ETOH  Vital Signs:  Patient profile:   77 year old female Height:  62 inches Weight:      142.50 pounds BMI:     26.16 O2 Sat:      97 % on Room air Temp:     98.0 degrees F oral Pulse rate:   84 / minute BP sitting:   130 / 66  (right arm) Cuff size:   regular  Vitals Entered By: Gweneth Dimitri RN (March 30, 2009 4:28 PM)  O2 Flow:  Room air CC: 2 wk follow up to discuss test results.  states she is still running a low grade fever in the evenings. Comments Medications reviewed with  patient Gweneth Dimitri RN  March 30, 2009 4:31 PM    Physical Exam  General:  normal appearance and healthy appearing.   Nose:  no sinus tenderness, clear nasal discharge Mouth:  no oral lesion Neck:  no JVP Lungs:  coarse breath sounds, but no wheezing or rales Heart:  regular rhythm, normal rate, and no murmurs.   Abdomen:  soft, nontender Extremities:  tightening of skin over fingers Cervical Nodes:  no significant adenopathy   Impression & Recommendations:  Problem # 1:  ABNORMAL CHEST XRAY (ICD-793.1) I am less suspicious that this represents an infectious process.  Her bronchoscopy cultures have been negative to date.  I will await final results for her AFB, but my suspicion for mycobacterial infection is low.  Will repeat her lab work and chest xray.  Will arrange for PFT's.  Depending on these results may need to consider adding immunosuppressant agents.  Problem # 2:  PNEUMONIA (ICD-486) She did not have improvement with prolonged courses of antibiotics.  Her bronchscopy cultures have been negative to date.  Problem # 3:  COPD (ICD-496) Will continue clinical monitoring.  May need to reassess whether she might benefit from inhaler therapy.  Problem # 4:  SYSTEMIC SCLEROSIS (ICD-710.1)  She is to follow up with rheumatology.  Problem # 5:  CHRONIC RHINITIS (ICD-472.0)  Continue symptomatic therapy.  Medications Added to Medication List This Visit: 1)  Amlodipine Besylate 10 Mg Tabs (Amlodipine besylate) .... Once daily 2)  Mucinex 600 Mg Tb12 (Guaifenesin) .... Take 1 tablet by mouth once a day as needed  Complete Medication List: 1)  Nasonex 50 Mcg/act Susp (Mometasone furoate) .... 2 sprays each nostril once daily 2)  Imipramine Hcl 25 Mg Tabs (Imipramine hcl) .... Three times a day 3)  Synthroid 125 Mcg Tabs (Levothyroxine sodium) .... One by mouth daily 4)  Lantus 80 Unit/ml Soln (insulin Glargine)  .... Every day 5)  Nitro-dur 0.4 Mg/hr Pt24 (Nitroglycerin)  .... Take 1 tablet under tongue as needed for chest pain. 6)  Pravachol 20 Mg Tabs (Pravastatin sodium) .... One by mouth daily 7)  Ticlopidine Hcl 250 Mg Tabs (Ticlopidine hcl) .... Take one tablet two times a day 8)  Amlodipine Besylate 10 Mg Tabs (Amlodipine besylate) .... Once daily 9)  Asprin 325mg   .... 2 by mouth daily 10)  Nexium 40 Mg Cpdr (Esomeprazole magnesium) .... One by mouth daily 11)  Ocuvite Extra Tabs (Multiple vitamins-minerals) .... Two times a day 12)  Mucinex 600 Mg Tb12 (Guaifenesin) .... Take 1 tablet by mouth once a day as needed 13)  Sennacot  .... As needed 14)  Halcyon  .... As needed  Other Orders: Est. Patient Level III (99213) T-2 View CXR (71020TC) TLB-BMP (Basic Metabolic Panel-BMET) (80048-METABOL) TLB-CBC Platelet - w/Differential (85025-CBCD) TLB-Hepatic/Liver Function Pnl (80076-HEPATIC) TLB-BNP (B-Natriuretic Peptide) (83880-BNPR) TLB-CRP-High Sensitivity (C-Reactive Protein) (86140-FCRP) TLB-Rheumatoid Factor (  RA) (62130-QM) TLB-Sedimentation Rate (ESR) (85652-ESR) T-Antinuclear Antib (ANA) [phone]) T-Anti SMA (28413-24401) T-Angiotensin i-Converting Enzyme (02725-36644) T- * Misc. Laboratory test [phone]) Full Pulmonary Function Test (PFT)  Patient Instructions: 1)  Lab tests today 2)  Chest xray today 3)  Will schedule breathing test (PFT) 4)  Follow up in 3 to 4 weeks

## 2010-07-26 NOTE — Progress Notes (Signed)
Summary: speak to nurse  Phone Note Call from Patient Call back at Home Phone [phone]   Caller: Patient Call For: Dr Jarold Motto Reason for Call: Talk to Nurse Summary of Call: Patient wants to speak directly to nurse  Initial call taken by: Tawni Levy,  February 22, 2010 11:57 AM  Follow-up for Phone Call        Pt cont's to have problem with constipation.  Would like to discuss with Dr. Jarold Motto.  OV sch. Follow-up by: Ashok Cordia RN,  February 22, 2010 12:56 PM

## 2010-07-26 NOTE — Assessment & Plan Note (Signed)
Summary: rov after pft ///kp   Copy to:  Azzie Roup, Dietrich Pates Primary Provider/Referring Provider:  Dr. Nicholos Johns  CC:  COPD.  Patient here to follow-up with PFT.[name]  History of Present Illness: I saw Ms. Christians for evaluation of her abnormal CT chest in the setting of COPD/emphysema, Scleroderma, possible pulmonary hypertension.  She has recurrence of her feverish feeling.  She has been having sinus congestion and post-nasal drainage.  She has also been getting more of a cough, but she thinks is coming more from her sinuses.  She denies chest pain, or wheezing.  She continues to have problems with her swallowing, and is due to see Dr. Jarold Motto with GI next month.  PFT's from today showed small airways obstruction, no bronchodilator response, normal lung volumes, and mild diffusion defect.CT chest from October 01, 2008:  Centrilobular emphysema.  Subpleural nodularity in right major fissure.  Multiple RLL nodules, largest 1 x 0.8 cm.  Left lung clear.          Current Medications (verified): 1)  Nasonex 50 Mcg/act  Susp (Mometasone Furoate) .... 2 Sprays Each Nostril Once Daily 2)  Imipramine Hcl 25 Mg  Tabs (Imipramine Hcl) .... 3 Tabs At Bedtimey 3)  Synthroid 125 Mcg Tabs (Levothyroxine Sodium) .... One By Mouth Daily 4)  Lantus 8 Units .... As Directed 5)  Nitro-Dur 0.4 Mg/hr  Pt24 (Nitroglycerin) .... Take 1 Tablet Under Tongue As Needed For Chest Pain. 6)  Pravachol 20 Mg Tabs (Pravastatin Sodium) .... One By Mouth Daily 7)  Ticlopidine Hcl 250 Mg  Tabs (Ticlopidine Hcl) .... Take One Tablet Two Times A Day 8)  Amlodipine Besylate 10 Mg Tabs (Amlodipine Besylate) .... Once Daily 9)  Asprin 325mg  .... 2 By Mouth Daily 10)  Nexium 40 Mg Cpdr (Esomeprazole Magnesium) .... One By Mouth Daily 11)  Ocuvite Extra   Tabs (Multiple Vitamins-Minerals) .... Two Times A Day 12)  Mucinex 600 Mg  Tb12 (Guaifenesin) .... Take 1 Tablet By Mouth Once A Day As Needed 13)  Sennacot .... As  Needed 14)  Halcyon .... As Needed  Allergies (verified): 1)  ! Lopressor Hct (Metoprolol-Hydrochlorothiazide) 2)  ! Pcn 3)  ! Sulfa  Past History:  Past Medical History: Coronary artery disease Dressler's Syndrome Hypertension Dyslipidemia Diabetes mellitus CVA 1991 Macular degeneration, legally blind Depression GERD Diverticulosis Irritable bowel syndrome Zenker's diverticulum Pneumonia as a child Tobacco abuse      - Quit 1999, smoked 1 pack per day for 40 years Emphysema      - PFT 06/16/09 FEV1 1.99(118%), FVC 2.73(111%), FEV1% 73, TLC 4.60(105%), DLCO 66%, no BD Pulmonary hypertension      - 07/25/03 right heart cath PA 35/18, PAOP 16      - 02/18/08 Echo PAS 30 mm Hg Chronic sinusitis Fibromyalgia Hypothyroidism Scleroderma followed by Dr. Dareen Piano Raynauds phenomenon Cervical disk disease  Past Surgical History: Reviewed history from 03/09/2009 and no changes required. Total abdominal hysterectomy 1973 Bilateral oophorectomy 2002 Spinal fusion and implantation of a plate in 1610 Removal of left lobe thyroid 1991 Cholecystectomy 1965 C3-C4 fusion 10/15/02 Dr. Trey Sailors Appendectomy Cataract Bronchoscopy 02/22/09 Dr. Coralyn Helling  Family History: Reviewed history from 11/27/2007 and no changes required. mother died at age 4 from sepsis father died at age 79 from stroke  Social History: Reviewed history from 10/28/2008 and no changes required. Divorced Quit tobacco 1999 (40 pack years) No ETOH  Vital Signs:  Patient profile:   77 year old female Height:  62 inches (157.48 cm) Weight:      139 pounds (63.18 kg) BMI:     25.52 O2 Sat:      95 % on Room air Temp:     98.1 degrees F (36.72 degrees C) oral Pulse rate:   101 / minute BP sitting:   118 / 70  (left arm) Cuff size:   regular  Vitals Entered By: Michel Bickers CMA (June 16, 2009 12:03 PM)  O2 Flow:  Room air  Physical Exam  General:  normal appearance and healthy  appearing.   Nose:  no sinus tenderness, clear nasal discharge Mouth:  no oral lesion Neck:  no JVP Lungs:  coarse breath sounds, but no wheezing or rales Heart:  regular rhythm, normal rate, and no murmurs.   Abdomen:  soft, nontender Extremities:  tightening of skin over fingers Cervical Nodes:  no significant adenopathy   Impression & Recommendations:  Problem # 1:  CHRONIC RHINITIS (ICD-472.0) Much of her current symptoms seem to be related to her sinuses with an acute sinusitis.  I will give her a prolonged course of levaquin, and monitor her response.  She may need repeat ENT evaluation depending on her response.  Problem # 2:  EMPHYSEMA (ICD-492.8) She has small airways disease on her PFT.  She has been on inhaler therapy before, but did not notice much symptomatic benefit.  Will monitor this for now.  Problem # 3:  ABNORMAL CHEST XRAY (ICD-793.1) Her last chest xray from Oct. 2010 showed clearing of the right upper lung field infiltrate/nodule.  I will repeat her chest xray today.  Problem # 4:  SYSTEMIC SCLEROSIS (ICD-710.1) She is to follow up with rheumatology.  Medications Added to Medication List This Visit: 1)  Levaquin 750 Mg Tabs (Levofloxacin) .... One by mouth once daily for 14 days  Complete Medication List: 1)  Nasonex 50 Mcg/act Susp (Mometasone furoate) .... 2 sprays each nostril once daily 2)  Imipramine Hcl 25 Mg Tabs (Imipramine hcl) .... 3 tabs at bedtimey 3)  Synthroid 125 Mcg Tabs (Levothyroxine sodium) .... One by mouth daily 4)  Lantus 8 Units  .... As directed 5)  Nitro-dur 0.4 Mg/hr Pt24 (Nitroglycerin) .... Take 1 tablet under tongue as needed for chest pain. 6)  Pravachol 20 Mg Tabs (Pravastatin sodium) .... One by mouth daily 7)  Ticlopidine Hcl 250 Mg Tabs (Ticlopidine hcl) .... Take one tablet two times a day 8)  Amlodipine Besylate 10 Mg Tabs (Amlodipine besylate) .... Once daily 9)  Asprin 325mg   .... 2 by mouth daily 10)  Nexium 40 Mg Cpdr  (Esomeprazole magnesium) .... One by mouth daily 11)  Ocuvite Extra Tabs (Multiple vitamins-minerals) .... Two times a day 12)  Mucinex 600 Mg Tb12 (Guaifenesin) .... Take 1 tablet by mouth once a day as needed 13)  Sennacot  .... As needed 14)  Halcyon  .... As needed 15)  Levaquin 750 Mg Tabs (Levofloxacin) .... One by mouth once daily for 14 days  Other Orders: Est. Patient Level III (99213) T-2 View CXR (71020TC)  Patient Instructions: 1)  Levaquin 750 mg by mouth once daily for 14 days with one refill 2)  Chest xray today 3)  Follow up in 3 to 4 weeks Prescriptions: LEVAQUIN 750 MG TABS (LEVOFLOXACIN) one by mouth once daily for 14 days  #14 x 1   Entered and Authorized by:   Coralyn Helling MD   Signed by:   Coralyn Helling MD on 06/16/2009  Method used:   Electronically to        [facility]* (retail)       [address]       [place], [place]  [id number]       Ph: [phone]       Fax: [phone]   RxID:   [id number]

## 2010-07-26 NOTE — Op Note (Signed)
Summary: Bronchoscopy Procedure Record/[facility]  Bronchoscopy Procedure Record/[facility]   Imported By: Sherian Rein 03/09/2009 10:26:26  _____________________________________________________________________  External Attachment:    Type:   Image     Comment:   External Document

## 2010-07-26 NOTE — Miscellaneous (Signed)
Summary: [facility]  [facility]   Imported By: Roderic Ovens 07/06/2009 11:39:18  _____________________________________________________________________  External Attachment:    Type:   Image     Comment:   External Document

## 2010-07-26 NOTE — Miscellaneous (Signed)
Summary: [facility]   [facility]   Imported By: Roderic Ovens 03/09/2010 12:07:08  _____________________________________________________________________  External Attachment:    Type:   Image     Comment:   External Document

## 2010-07-26 NOTE — Assessment & Plan Note (Signed)
Summary: [facility]   Visit Type:  Follow-up  CC:  shortness of breath.  History of Present Illness: [name] is a 77 year old woman. I last saw her in cardiology clinic on March 11. She has a history of CAD (status post PTCA/stent in August 2009 due to in-stent restenosis.) She remains on Ticlid therapy.  The patient was recently admitted to [facility]. I saw her in the emergency room she was complaining of shoulder and back pain that she couldn't explain she was quite uncomfortable. Because of this she underwent repeat cardiac catheterization. There are is no significant in-stent restenosis of the right. No critical lesions noted. Of note, there was some dampening of the catheter in the left main, but was not felt to be critically narrowed. There is there was good reflux of dye into the aorta.  The patient was sent home on medical therapy.  She also had a chest x-ray there that showed a question of a right middle lobe nodule. Recommend followup.  The patient still is short of breath. She says she just gives out with housework vacuuming or washing her floor (she uses a [employer] materials). She said she was doing better. A few months ago. With other activities, such as walking in from the parking lot or going through cardiac rehabilitation. She did not notice a problem. Her graft. She has not had any shoulder or back pain.  Problems Prior to Update: 1)  Coronary Artery Disease  (ICD-414.00) 2)  Hyperlipidemia  (ICD-272.4) 3)  Chest Pain  (ICD-786.50) 4)  Systemic Sclerosis  (ICD-710.1) 5)  Chronic Rhinitis  (ICD-472.0) 6)  Cough Variant Asthma  (ICD-493.82) 7)  Gastritis  (ICD-535.50) 8)  Blindness  (ICD-369.4) 9)  Arthritis  (ICD-716.90) 10)  Thyroid Disorder  (ICD-246.9) 11)  Pulmonary Infiltrates  () 12)  Diverticular Disease  (ICD-562.10) 13)  Irritable Bowel Syndrome  (ICD-564.1) 14)  Pneumonia  (ICD-486) 15)  Depression, Chronic  (ICD-311) 16)  Hypertension   (ICD-401.9) 17)  Cva  (ICD-434.91) 18)  Sinusitis, Chronic  (ICD-473.9) 19)  Diabetes Mellitus, Type II, On Insulin  (ICD-250.00) 20)  Spondylosis, Cervical  (ICD-721.0) 21)  Other Specified Disorder of The Esophagus  (ICD-530.89) 22)  Zenker's Diverticulum  (ICD-530.6) 23)  Dysphagia Unspecified  (ICD-787.20) 24)  Gastroesophageal Reflux Disease, Chronic  (ICD-530.81) 25)  Flatulence Eructation and Gas Pain  (ICD-787.3) 26)  Constipation, Chronic  (ICD-564.09) 27)  Abdominal Adhesions  (ICD-568.0)  Current Medications (verified): 1)  Nasonex 50 Mcg/act  Susp (Mometasone Furoate) .... 2 Sprays Each Nostril Once Daily 2)  Ocuvite Extra   Tabs (Multiple Vitamins-Minerals) .... Two Times A Day 3)  Zinc 50 Mg  Tabs (Zinc) .... .qdtab 4)  Imipramine Hcl 25 Mg  Tabs (Imipramine Hcl) .... Three Times A Day 5)  Synthroid 125 Mcg Tabs (Levothyroxine Sodium) .... One By Mouth Daily 6)  Lantus 80 Unit/ml  Soln (Insulin Glargine) .... Every Day 7)  Mucinex 600 Mg  Tb12 (Guaifenesin) .... Take 1 Tablet By Mouth Once A Day 8)  Nitro-Dur 0.4 Mg/hr  Pt24 (Nitroglycerin) .... Take 1 Tablet Under Tongue As Needed For Chest Pain. 9)  Pravachol 20 Mg Tabs (Pravastatin Sodium) .... One By Mouth Daily 10)  Ticlopidine Hcl 250 Mg  Tabs (Ticlopidine Hcl) .... Take One Tablet Two Times A Day 11)  Sennacot .... As Needed 12)  Nexium 40 Mg Cpdr (Esomeprazole Magnesium) .... One By Mouth Daily 13)  Asprin 325mg  .... 2 By Mouth Daily 14)  Halcyon .Marland KitchenMarland KitchenMarland Kitchen  As Needed  Allergies: 1)  ! Lopressor Hct (Metoprolol-Hydrochlorothiazide) 2)  ! Pcn 3)  ! Sulfa  Past History:  Past Medical History:    Cornoary artery diesease    Dressler's Syndrome    GASTRITIS (ICD-535.50)    BLINDNESS (ICD-369.4)    ARTHRITIS (ICD-716.90)    THYROID DISORDER (ICD-246.9)    * PULMONARY INFILTRATES    DIVERTICULAR DISEASE (ICD-562.10)    IRRITABLE BOWEL SYNDROME (ICD-564.1)    PNEUMONIA (ICD-486)    DEPRESSION, CHRONIC (ICD-311)     HYPERTENSION (ICD-401.9)    CVA (ICD-434.91)    SINUSITIS, CHRONIC (ICD-473.9)    DIABETES MELLITUS, TYPE II, ON INSULIN (ICD-250.00)    HYPERLIPIDEMIA (ICD-272.4)    CORONARY ARTERY DISEASE (ICD-414.00)    SPONDYLOSIS, CERVICAL (ICD-721.0)    OTHER SPECIFIED DISORDER OF THE ESOPHAGUS (ICD-530.89)    ZENKER'S DIVERTICULUM (ICD-530.6)    DYSPHAGIA UNSPECIFIED (ICD-787.20)    GASTROESOPHAGEAL REFLUX DISEASE, CHRONIC (ICD-530.81)    FLATULENCE ERUCTATION AND GAS PAIN (ICD-787.3)    CONSTIPATION, CHRONIC (ICD-564.09)    ABDOMINAL ADHESIONS (ICD-568.0)     (07/10/2008)  Family History:    mother died at age 12 from sepsis    father died at age 55 from stroke     (11/27/2007)  Family History:    Reviewed history from 11/27/2007 and no changes required:       mother died at age 20 from sepsis       father died at age 30 from stroke  Social History:    Reviewed history from 07/10/2008 and no changes required:       Divorced       Quit tobacco Sept 1997 (40 pack years)       No ETOH  Vital Signs:  Patient profile:   77 year old female Height:      62 inches Weight:      144 pounds BMI:     26.43 Pulse rate:   91 / minute Resp:     18 per minute BP sitting:   127 / 57  (left arm)  Vitals Entered By: Renee Coy, CNA (September 28, 2008 11:33 AM)  Physical Exam  General:  Well developed, well nourished, in no acute distress. Head:  normocephalic and atraumatic Mouth:  Teeth, gums and palate normal. Oral mucosa normal. Neck:  Neck supple, no JVD. No masses, thyromegaly or abnormal cervical nodes. Lungs:  Clear bilaterally to auscultation and percussion. Heart:  Non-displaced PMI, chest non-tender; regular rate and rhythm, S1, S2 without murmurs, rubs or gallops. Carotid upstroke normal, no bruit. Normal abdominal aortic size, no bruits. Femorals normal pulses, no bruits. Pedals normal pulses. No edema, no varicosities. Abdomen:  Bowel sounds positive; abdomen soft and  non-tender without masses, organomegaly, or hernias noted. No hepatosplenomegaly. Msk:  Back normal, normal gait. Muscle strength and tone normal. Pulses:  pulses normal in all 4 extremities Neurologic:  Alert and oriented x 3.   Cardiac Cath  Procedure date:  09/14/2008  Findings:      Left main had about 40-50% tubular stenosis from the ostium to the   midsection.  As above, there was about 20 mmHg damping with the catheter   insertion.  She was asymptomatic with this.  There appeared to be good   reflux.  In multiple projections, this stenosis never looked worse than   50%.      Left anterior descending was a long vessel coursing to the apex.  It   gave off  a large diagonal in the proximal portion.  In the proximal   portion of the LAD just after the takeoff the diagonal, there was a 40%   lesion.  In the midsection of the LAD, there was a tubular 30% lesion in   the midsection of the diagonal and there was also a 30% lesion.      Left circumflex system was a moderate-sized system and gave off 3 small   marginal branches.  In the ostium of the left circumflex, there was a   60% narrowing and there was a 40-50% narrowing more distally into the   takeoff of the OM-3.  In the proximal portion of the OM-1, there was 80-   90% focal lesion, this was a very small artery, less than 1.5 mm.      Right coronary was a large dominant vessel, it gave off a PDA and 2   posterolaterals.  There was a long stent in the midsection.  There was a   30% lesion in the proximal portion of the RCA prior to the stent and the   stent was widely patent.  Just after the stent, there was a 30% lesion   and in the mid PDA, there was a 40% lesion.      Left ventriculogram done in the RAO position showed an EF of 60%  CXR  Procedure date:  09/15/2008  Findings:      Findings: As seen on the patient's most recent chest film, there is   a focal nodular opacity the right mid lung.  Pulmonary interstitium    appears somewhat coarsened.  No consolidative process.  No   effusion.  Heart size normal.    IMPRESSION:   Persistent nodular passed in the right mid lung.  Recommend chest   CT for further evaluation.   Impression & Recommendations:  Problem # 1:  DYSPNEA (ICD-786.05) I am not sure what this represents.  Cath without significant change. Continue rehab. Set up for chest CT. The following medications were removed from the medication list:    Toprol Xl 25 Mg Tb24 (Metoprolol succinate) .[name]... Take 1 tablet by mouth once a day    Metoprolol Succinate 25 Mg Tb24 (Metoprolol succinate) .[name]... Take one tablet daily.  Problem # 2:  CORONARY ARTERY DISEASE (ICD-414.00) Cath results as noted.  Continue medical Rx. The following medications were removed from the medication list:    Toprol Xl 25 Mg Tb24 (Metoprolol succinate) .[name]... Take 1 tablet by mouth once a day    Metoprolol Succinate 25 Mg Tb24 (Metoprolol succinate) .[name]... Take one tablet daily. Her updated medication list for this problem includes:    Nitro-dur 0.4 Mg/hr Pt24 (Nitroglycerin) .[name]... Take 1 tablet under tongue as needed for chest pain.    Ticlopidine Hcl 250 Mg Tabs (Ticlopidine hcl) .[name]... Take one tablet two times a day  Problem # 3:  HYPERLIPIDEMIA (ICD-272.4) continue Pravachol Her updated medication list for this problem includes:    Pravachol 20 Mg Tabs (Pravastatin sodium) ..... One by mouth daily  Problem # 4:  SYSTEMIC SCLEROSIS (ICD-710.1) CT of chest.  Problem # 5:  * PULMONARY INFILTRATES Chest CT.

## 2010-07-26 NOTE — Consult Note (Signed)
Summary: [facility]   Imported By: Esmeralda Links D'jimraou 09/18/2007 11:18:15  _____________________________________________________________________  External Attachment:    Type:   Image     Comment:   External Document

## 2010-07-26 NOTE — Procedures (Signed)
Summary: Colonoscopy  Patient: [name] Note: All result statuses are Final unless otherwise noted.  Tests: (1) Colonoscopy (COL)   COL Colonoscopy           DONE (C)     [facility]     [address] [place], [place]  [address]           COLONOSCOPY PROCEDURE REPORT           PATIENT:  [name], [name]  MR#:  #[id number]     BIRTHDATE:  [birth year], 78 yrs. old  GENDER:  female     ENDOSCOPIST:  Barbette Hair. Arlyce Dice, MD     REF. BY:     PROCEDURE DATE:  11/26/2009     PROCEDURE:     ASA CLASS:  Class II     INDICATIONS:  abnormal CT of abdomen Question of neoplasm in     sigmoid colon by CT vs diverticular changes     MEDICATIONS:   MAC sedation, administered by CRNA           DESCRIPTION OF PROCEDURE:   After the risks benefits and     alternatives of the procedure were thoroughly explained, informed     consent was obtained.  Digital rectal exam was performed and     revealed no abnormalities.   The  endoscope was introduced through     the anus and advanced to the cecum, which was identified by both     the appendix and ileocecal valve, without limitations.  The     quality of the prep was excellent, using MoviPrep.  The instrument     was then slowly withdrawn as the colon was fully examined.     <<PROCEDUREIMAGES>>           FINDINGS:  Moderate diverticulosis was found. Moderate     diverticulosis with marked lumenal spasm and stenosis. Scope     passed with marked resistence due to stenosis.  A 1.5cm submucosal     yellowish mass c/w a lipoma was seen in the distal transverse     colon .No mucosal abnormalities were seen (see image14, image13,     and image12) in the sigmoid colon..  Diverticula were found in the     descending colon.  This was otherwise a normal examination of the     colon.   Retroflexed views in the rectum revealed no     abnormalities.    The scope was then withdrawn from the patient     and the procedure completed.        COMPLICATIONS:  None     ENDOSCOPIC IMPRESSION:     1) Stenotic area of sigmoid colon secondary to diverticular     changes and adhesions     2) Diverticula in the descending colon     3) colonic lipoma     4) Otherwise normal examination     RECOMMENDATIONS:Fiber supplementation     Miralax     Call office to schedule     f/u appointment with Dr. Jarold Motto           REPEAT EXAM:  No           ______________________________     Barbette Hair. Arlyce Dice, MD           CC:  Sheryn Bison, MDAjith Nicholos Johns, MD           n.     REVISED:  11/26/2009 11:26 AM     eSIGNED:   Barbette Hair. Tyrus Wilms at 11/26/2009 11:26 AM           [name], #[id number]  Note: An exclamation mark (!) indicates a result that was not dispersed into the flowsheet. Document Creation Date: 11/26/2009 1:14 PM _______________________________________________________________________  (1) Order result status: Final Collection or observation date-time: 11/26/2009 11:03 Requested date-time:  Receipt date-time:  Reported date-time:  Referring Physician:   Ordering Physician: Melvia Heaps [phone]) Specimen Source:  Source: Launa Grill Order Number: [id number] Lab site:

## 2010-07-26 NOTE — Miscellaneous (Signed)
Summary: Orders Update pft charges  Clinical Lists Changes  Orders: Added new Service order of Carbon Monoxide diffusing w/capacity (94720) - Signed Added new Service order of Lung Volumes (94240) - Signed Added new Service order of Spirometry (Pre & Post) (94060) - Signed 

## 2010-07-26 NOTE — Consult Note (Signed)
Summary: [facility]   Imported By: Esmeralda Links D'jimraou 09/18/2007 11:17:30  _____________________________________________________________________  External Attachment:    Type:   Image     Comment:   External Document

## 2010-07-26 NOTE — Miscellaneous (Signed)
Summary: [facility]  [facility]   Imported By: Roderic Ovens 02/12/2009 14:31:37  _____________________________________________________________________  External Attachment:    Type:   Image     Comment:   External Document

## 2010-07-26 NOTE — Assessment & Plan Note (Signed)
Summary: rov 3-4 wks ///kp   Copy to:  Malva Limes, Sheryn Bison, Margaretmary Bayley, Dietrich Pates Primary Provider/Referring Provider:  Georgianne Fick, MD   CC:  Emphysema. The patient states her breathing has greatly improved and she "feels like a new woman" on Prednosone.[name]  History of Present Illness: I saw [name] for evaluation of her abnormal CT chest in the setting of COPD/emphysema, Scleroderma, possible pulmonary hypertension.  Since starting prednisone she feels her breathing is much better.  She is no longer having fevers in the afternoon.  Her energy level has improved also.  She still has a cough, but is not bringing up much sputum.  She was told she had Sjogren syndrome also. CT chest from October 01, 2008:  Centrilobular emphysema.  Subpleural nodularity in right major fissure.  Multiple RLL nodules, largest 1 x 0.8 cm.  Left lung clear.          Current Medications (verified): 1)  Nasonex 50 Mcg/act  Susp (Mometasone Furoate) .... 2 Sprays Each Nostril Once Daily 2)  Imipramine Hcl 25 Mg  Tabs (Imipramine Hcl) .... 3 Tabs At Bedtimey 3)  Synthroid 125 Mcg Tabs (Levothyroxine Sodium) .... One By Mouth Daily 4)  Lantus 100 Unit/ml Soln (Insulin Glargine) .... 8-12 Units Once Daily 5)  Nitro-Dur 0.4 Mg/hr  Pt24 (Nitroglycerin) .... Take 1 Tablet Under Tongue As Needed For Chest Pain. 6)  Pravachol 20 Mg Tabs (Pravastatin Sodium) .... One By Mouth Daily 7)  Ticlopidine Hcl 250 Mg  Tabs (Ticlopidine Hcl) .... Take One Tablet Two Times A Day 8)  Amlodipine Besylate 10 Mg Tabs (Amlodipine Besylate) .... Once Daily 9)  Aspirin 325 Mg  Tabs (Aspirin) .... One Tablet By Mouth Two Times A Day 10)  Nexium 40 Mg Cpdr (Esomeprazole Magnesium) .... One By Mouth Daily 11)  Ocuvite Extra   Tabs (Multiple Vitamins-Minerals) .... Two Times A Day 12)  Mucinex 600 Mg  Tb12 (Guaifenesin) .... Take 1 Tablet By Mouth Once A Day As Needed 13)  Sennacot .... As Needed 14)  Halcyon .... As  Needed 15)  Prednisone 10 Mg Tabs (Prednisone) .... Two Pills Once Daily  Allergies (verified): 1)  ! Lopressor Hct (Metoprolol-Hydrochlorothiazide) 2)  ! Pcn 3)  ! Sulfa  Past History:  Past Medical History: Reviewed history from 06/22/2009 and no changes required. Coronary artery disease Dressler's Syndrome Hypertension Dyslipidemia Diabetes mellitus CVA 1991 Macular degeneration, legally blind Depression GERD Diverticulosis Irritable bowel syndrome Zenker's diverticulum Pneumonia as a child Tobacco abuse      - Quit 1999, smoked 1 pack per day for 40 years Emphysema      - PFT 06/16/09 FEV1 1.99(118%), FVC 2.73(111%), FEV1% 73, TLC 4.60(105%), DLCO 66%, no BD Pulmonary hypertension      - 07/25/03 right heart cath PA 35/18, PAOP 16      - 02/18/08 Echo PAS 30 mm Hg Chronic sinusitis Fibromyalgia Hypothyroidism Scleroderma followed by Dr. Dareen Piano Raynauds phenomenon Cervical disk disease Adenomatous Colon Polyps  Past Surgical History: Reviewed history from 03/09/2009 and no changes required. Total abdominal hysterectomy 1973 Bilateral oophorectomy 2002 Spinal fusion and implantation of a plate in 1610 Removal of left lobe thyroid 1991 Cholecystectomy 1965 C3-C4 fusion 10/15/02 Dr. Trey Sailors Appendectomy Cataract Bronchoscopy 02/22/09 Dr. Coralyn Helling  Vital Signs:  Patient profile:   77 year old female Height:      62 inches (157.48 cm) Weight:      138.38 pounds (62.90 kg) BMI:     25.40  O2 Sat:      97 % on Room air Temp:     98.6 degrees F (37.00 degrees C) oral Pulse rate:   90 / minute BP sitting:   130 / 76  (left arm) Cuff size:   regular  Vitals Entered By: [name] CMA (August 03, 2009 2:32 PM)  O2 Sat at Rest %:  97 O2 Flow:  Room air  Physical Exam  General:  normal appearance and healthy appearing.   Nose:  clear nasal discharge Mouth:  no oral lesion, no thrush Neck:  no JVP Lungs:  coarse breath sounds, but no wheezing or  rales Heart:  regular rhythm, normal rate, and no murmurs.   Abdomen:  soft, nontender Extremities:  tightening of skin over fingers Cervical Nodes:  no significant adenopathy   Impression & Recommendations:  Problem # 1:  ABNORMAL CHEST XRAY (ICD-793.1) She has significant clinical improvement since being on prednisone.  Will repeat her chest xray today.  Will begin to gradual taper her dose of prednisone.  Problem # 2:  SYSTEMIC SCLEROSIS (ICD-710.1) Will check her renal function while she is on prednisone.  Problem # 3:  EMPHYSEMA (ICD-492.8)  Monitor off inhalers.  Medications Added to Medication List This Visit: 1)  Prednisone 10 Mg Tabs (Prednisone) .... 1.5 pills once daily for 2 weeks, then 1 pill once daily for 2 weeks, then 1/2 pill once daily  Complete Medication List: 1)  Nasonex 50 Mcg/act Susp (Mometasone furoate) .... 2 sprays each nostril once daily 2)  Imipramine Hcl 25 Mg Tabs (Imipramine hcl) .... 3 tabs at bedtimey 3)  Synthroid 125 Mcg Tabs (Levothyroxine sodium) .... One by mouth daily 4)  Lantus 100 Unit/ml Soln (Insulin glargine) .... 8-12 units once daily 5)  Nitro-dur 0.4 Mg/hr Pt24 (Nitroglycerin) .... Take 1 tablet under tongue as needed for chest pain. 6)  Pravachol 20 Mg Tabs (Pravastatin sodium) .... One by mouth daily 7)  Ticlopidine Hcl 250 Mg Tabs (Ticlopidine hcl) .... Take one tablet two times a day 8)  Amlodipine Besylate 10 Mg Tabs (Amlodipine besylate) .... Once daily 9)  Aspirin 325 Mg Tabs (Aspirin) .... One tablet by mouth two times a day 10)  Nexium 40 Mg Cpdr (Esomeprazole magnesium) .... One by mouth daily 11)  Ocuvite Extra Tabs (Multiple vitamins-minerals) .... Two times a day 12)  Mucinex 600 Mg Tb12 (Guaifenesin) .... Take 1 tablet by mouth once a day as needed 13)  Sennacot  .... As needed 14)  Halcyon  .... As needed 15)  Prednisone 10 Mg Tabs (Prednisone) .... 1.5 pills once daily for 2 weeks, then 1 pill once daily for 2 weeks,  then 1/2 pill once daily  Other Orders: Est. Patient Level III ([address]) TLB-BMP (Basic Metabolic Panel-BMET) (80048-METABOL) TLB-CBC Platelet - w/Differential (85025-CBCD) TLB-Hepatic/Liver Function Pnl (80076-HEPATIC) T-2 View CXR (71020TC)  Patient Instructions: 1)  Prednisone 10 mg pills: 1.5 pills once daily for two weeks, then 1 pill once daily for 2 weeks, then 1/2 pill once daily until next appointment. 2)  Lab work and chest xray today 3)  Follow up in 6 to 8 weeks Prescriptions: PREDNISONE 10 MG TABS (PREDNISONE) 1.5 pills once daily for 2 weeks, then 1 pill once daily for 2 weeks, then 1/2 pill once daily  #60 x 1   Entered and Authorized by:   Coralyn Helling MD   Signed by:   Coralyn Helling MD on 08/03/2009   Method used:   Electronically  to        [facility] (retail)       [address]       [place], [place]  [id number]       Ph: [phone]       Fax: [phone]   RxID:   [id number]

## 2010-07-26 NOTE — Progress Notes (Signed)
Summary: Pt request call about a surgery  Phone Note Call from Patient Call back at Home Phone [phone]   Caller: Patient Summary of Call: Pt request call. Initial call taken by: Judie Grieve,  Nov 01, 2009 1:08 PM  Follow-up for Phone Call        Called patient back...she is scheduled for a Colon with polyp removal with Dr. Arlyce Dice on 11/26/2009 at [facility]. She is concerned about taking Ticlid because she feels that they will probably need to hold the medication prior to procedure. Advised I will discuss with Dr. Tenny Craw and call her back. Follow-up by: Suzan Garibaldi RN  Additional Follow-up for Phone Call Additional follow up Details #1::        Patient cannot come off ticlid at least until after 12/04/2009.   She had a DES to the L main less than 1 year ago. Additional Follow-up by: Sherrill Raring, MD, [facility],  Nov 01, 2009 9:02 PM    New/Updated Medications: PRAVACHOL 10 MG TABS (PRAVASTATIN SODIUM) 1 tab every day FISH OIL 1000 MG CAPS (OMEGA-3 FATTY ACIDS) 2 caps two times a day  Appended Document: Pt request call about a surgery Pt. aware about she cannot come off Ticlid at least until  12/04/09.

## 2010-07-26 NOTE — Miscellaneous (Signed)
Summary: [facility]  [facility]   Imported By: Roderic Ovens 01/05/2009 14:08:53  _____________________________________________________________________  External Attachment:    Type:   Image     Comment:   External Document

## 2010-07-26 NOTE — Letter (Signed)
Summary: Appointment - Reminder 2  [facility], Main Office  [address]   [place], [place] [address]   Phone: [phone]  Fax: [phone]     August 19, 2009 MRN: [id number]   [name] [address] [place], [place]  [address]   Dear Ms. [name],  Our records indicate that it is time to schedule a follow-up appointment with Dr. Tenny Craw. It is very important that we reach you to schedule this appointment. We look forward to participating in your health care needs. Please contact us at the number listed above at your earliest convenience to schedule your appointment.  If you are unable to make an appointment at this time, give [place] a call so we can update our records.     Sincerely,   Migdalia Dk [facility] Scheduling Team

## 2010-07-26 NOTE — Miscellaneous (Signed)
Summary: Cardiac Rehab Note  Cardiac Rehab Note   Imported By: Roderic Ovens 01/04/2009 09:39:58  _____________________________________________________________________  External Attachment:    Type:   Image     Comment:   External Document

## 2010-07-26 NOTE — Assessment & Plan Note (Signed)
Summary: rov 4 wks ///kp   Copy to:  Dr. Dareen Piano Rheumatology, Dietrich Pates Primary Provider/Referring Provider:  Georgianne Fick, MD   CC:  The patient says her breathing has improved but her dry cough is worse than before.[name]  History of Present Illness: I saw Ms. Dominique for evaluation of her abnormal CT chest in the setting of COPD/emphysema, Scleroderma, possible pulmonary hypertension.  Her breathing has been doing better since she has been on prednisone again.  She has more energy, and is not having anymore fever or chest pains.  She has a dry throat and dry cough.  She does have Sjogren's disease, and was told that she has dysphagia due to her scleroderma.  Her rheumatologist recommended that she continue with prednisone 5 mg once daily.          Current Medications (verified): 1)  Nasonex 50 Mcg/act  Susp (Mometasone Furoate) .... 2 Sprays Each Nostril Once Daily 2)  Imipramine Hcl 25 Mg  Tabs (Imipramine Hcl) .... 3 Tabs At Bedtimey 3)  Synthroid 125 Mcg Tabs (Levothyroxine Sodium) .... One By Mouth Daily 4)  Lantus 100 Unit/ml Soln (Insulin Glargine) .... 8-12 Units Once Daily 5)  Nitro-Dur 0.4 Mg/hr  Pt24 (Nitroglycerin) .... Take 1 Tablet Under Tongue As Needed For Chest Pain. 6)  Pravachol 20 Mg Tabs (Pravastatin Sodium) .... One By Mouth Daily 7)  Ticlopidine Hcl 250 Mg  Tabs (Ticlopidine Hcl) .... Take One Tablet Two Times A Day 8)  Amlodipine Besylate 10 Mg Tabs (Amlodipine Besylate) .... Once Daily 9)  Aspirin 325 Mg  Tabs (Aspirin) .... One Tablet By Mouth Two Times A Day 10)  Nexium 40 Mg Cpdr (Esomeprazole Magnesium) .... One By Mouth Daily 11)  Ocuvite Extra   Tabs (Multiple Vitamins-Minerals) .... Two Times A Day 12)  Mucinex 600 Mg  Tb12 (Guaifenesin) .... Take 1 Tablet By Mouth Once A Day As Needed 13)  Senokot 8.6 Mg Tabs (Sennosides) .... One Tablet By Mouth Once Daily 14)  Halcyon .... As Needed 15)  Dulcolax Stool Softener 100 Mg Caps (Docusate Sodium)  .... One Tablet By Mouth Once Daily 16)  Kristalose 10 Gm  Pack (Lactulose) .[name].. 10 Gm Q Hs 17)  Prednisone 5 Mg Tabs (Prednisone) .[name].. 1 By Mouth Daily  Allergies (verified): 1)  ! Lopressor Hct (Metoprolol-Hydrochlorothiazide) 2)  ! Pcn 3)  ! Sulfa  Past History:  Past Medical History: Coronary artery disease Dressler's Syndrome Hypertension Dyslipidemia Diabetes mellitus CVA 1991 Macular degeneration, legally blind Depression GERD Diverticulosis Irritable bowel syndrome Zenker's diverticulum Pneumonia as a child Tobacco abuse      - Quit 1999, smoked 1 pack per day for 40 years Emphysema      - PFT 06/16/09 FEV1 1.99(118%), FVC 2.73(111%), FEV1% 73, TLC 4.60(105%), DLCO 66%, no BD Pulmonary hypertension      - 07/25/03 right heart cath PA 35/18, PAOP 16      - 02/18/08 Echo PAS 30 mm Hg Chronic sinusitis Fibromyalgia Hypothyroidism Scleroderma followed by Dr. Dareen Piano Sjogren's Disease Raynauds phenomenon Cervical disk disease Adenomatous Colon Polyps  Vital Signs:  Patient profile:   77 year old female Height:      62 inches (157.48 cm) Weight:      143.50 pounds (65.23 kg) BMI:     26.34 O2 Sat:      96 % on Room air Temp:     98.5 degrees F (36.94 degrees C) oral Pulse rate:   97 / minute BP sitting:  110 / 70  (right arm) Cuff size:   regular  Vitals Entered By: Michel Bickers CMA (Oct 25, 2009 2:24 PM)  O2 Sat at Rest %:  96 O2 Flow:  Room air  Physical Exam  Nose:  no deformity, discharge, inflammation, or lesions Mouth:  no oral lesion, no thrush Neck:  no JVP Lungs:  coarse breath sounds, but no wheezing or rales Heart:  regular rhythm, normal rate, and no murmurs.   Extremities:  tightening of skin over fingers Cervical Nodes:  no significant adenopathy   Impression & Recommendations:  Problem # 1:  ABNORMAL CHEST XRAY (ICD-793.1) She has pulmonary infiltrates that have resolved with prednisone therapy.  Likely related to her  connective tissue disease.  With regard to her lung disease she could try dose reduction for prednisone.  She is to remain on prednisone 5 mg once daily per rheumatology.  I do not think there is any pulmonary contra-indication for her to restart cardiac rehab.  Problem # 2:  SYSTEMIC SCLEROSIS (ICD-710.1) She is to continue on prednisone 5 mg once daily.  I will defer further manipulation of her prednisone to rheumatology.  Medications Added to Medication List This Visit: 1)  Prednisone 5 Mg Tabs (Prednisone) .[name].. 1 by mouth daily  Complete Medication List: 1)  Prednisone 5 Mg Tabs (Prednisone) .[name].. 1 by mouth daily 2)  Nasonex 50 Mcg/act Susp (Mometasone furoate) .... 2 sprays each nostril once daily 3)  Imipramine Hcl 25 Mg Tabs (Imipramine hcl) .... 3 tabs at bedtimey 4)  Synthroid 125 Mcg Tabs (Levothyroxine sodium) .... One by mouth daily 5)  Lantus 100 Unit/ml Soln (Insulin glargine) .... 8-12 units once daily 6)  Nitro-dur 0.4 Mg/hr Pt24 (Nitroglycerin) .... Take 1 tablet under tongue as needed for chest pain. 7)  Pravachol 20 Mg Tabs (Pravastatin sodium) .... One by mouth daily 8)  Ticlopidine Hcl 250 Mg Tabs (Ticlopidine hcl) .... Take one tablet two times a day 9)  Amlodipine Besylate 10 Mg Tabs (Amlodipine besylate) .... Once daily 10)  Aspirin 325 Mg Tabs (Aspirin) .... One tablet by mouth two times a day 11)  Nexium 40 Mg Cpdr (Esomeprazole magnesium) .... One by mouth daily 12)  Ocuvite Extra Tabs (Multiple vitamins-minerals) .... Two times a day 13)  Mucinex 600 Mg Tb12 (Guaifenesin) .... Take 1 tablet by mouth once a day as needed 14)  Senokot 8.6 Mg Tabs (Sennosides) .... One tablet by mouth once daily 15)  Halcyon  .... As needed 16)  Dulcolax Stool Softener 100 Mg Caps (Docusate sodium) .... One tablet by mouth once daily 17)  Kristalose 10 Gm Pack (Lactulose) .[name].. 10 gm q hs  Other Orders: Est. Patient Level III ([address])  Patient Instructions: 1)  Follow up in 2  months

## 2010-07-26 NOTE — Assessment & Plan Note (Signed)
Summary: f19m/ gd   Primary Provider:  Dr. Barrie Dunker   History of Present Illness: [name] is a 77 year old with a history of CAD.  I last saw her in clinic in April.  She has a hx of CAD with MI.  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  The patient was doing well until earlier this spring.  She was admitted with back discomfort and worsening shortness of breath. Ms .  [name] underwent repeat cardiac catheterization.  This showed no instent restenosis in the RCA.  She did have some dampening of pressures in the L main, but on close review did not appear to have any flow limitieng lesions. Since this time, she has continued to have shortness of breath.  She has been seen by Coralyn Helling.  She is felt to have mild obstructive lung disease, not enough to explain symptoms.  Repeat echo, did not add any new information.  After discussion with Dr. Craige Cotta, plan was for right heart catheterization to define pressures. The patient continues to have shortness of breath.  She denies chest pains.  Still has back pain.  Problems Prior to Update: 1)  Pulmonary Hypertension  (ICD-416.8) 2)  Sinusitis, Acute  (ICD-461.9) 3)  Pulmonary Nodule  (ICD-518.89) 4)  Coronary Artery Disease  (ICD-414.00) 5)  Hyperlipidemia  (ICD-272.4) 6)  Chest Pain  (ICD-786.50) 7)  Systemic Sclerosis  (ICD-710.1) 8)  Chronic Rhinitis  (ICD-472.0) 9)  COPD  (ICD-496) 10)  Gastritis  (ICD-535.50) 11)  Blindness  (ICD-369.4) 12)  Arthritis  (ICD-716.90) 13)  Thyroid Disorder  (ICD-246.9) 14)  Diverticular Disease  (ICD-562.10) 15)  Irritable Bowel Syndrome  (ICD-564.1) 16)  Depression, Chronic  (ICD-311) 17)  Hypertension  (ICD-401.9) 18)  Cva  (ICD-434.91) 19)  Diabetes Mellitus, Type II, On Insulin  (ICD-250.00) 20)  Spondylosis, Cervical  (ICD-721.0) 21)  Other Specified Disorder of The Esophagus  (ICD-530.89) 22)  Zenker's Diverticulum  (ICD-530.6) 23)  Dysphagia Unspecified  (ICD-787.20) 24)   Gastroesophageal Reflux Disease, Chronic  (ICD-530.81) 25)  Flatulence Eructation and Gas Pain  (ICD-787.3) 26)  Constipation, Chronic  (ICD-564.09) 27)  Abdominal Adhesions  (ICD-568.0)  Medications Prior to Update: 1)  Spiriva Handihaler 18 Mcg Caps (Tiotropium Bromide Monohydrate) .... One Puff Once Daily 2)  Nasonex 50 Mcg/act  Susp (Mometasone Furoate) .... 2 Sprays Each Nostril Once Daily 3)  Imipramine Hcl 25 Mg  Tabs (Imipramine Hcl) .... Three Times A Day 4)  Synthroid 125 Mcg Tabs (Levothyroxine Sodium) .... One By Mouth Daily 5)  Lantus 80 Unit/ml  Soln (Insulin Glargine) .... Every Day 6)  Nitro-Dur 0.4 Mg/hr  Pt24 (Nitroglycerin) .... Take 1 Tablet Under Tongue As Needed For Chest Pain. 7)  Pravachol 20 Mg Tabs (Pravastatin Sodium) .... One By Mouth Daily 8)  Ticlopidine Hcl 250 Mg  Tabs (Ticlopidine Hcl) .... Take One Tablet Two Times A Day 9)  Amlodipine Besylate 5 Mg Tabs (Amlodipine Besylate) .... One By Mouth Once Daily 10)  Asprin 325mg  .... 2 By Mouth Daily 11)  Nexium 40 Mg Cpdr (Esomeprazole Magnesium) .... One By Mouth Daily 12)  Zinc 50 Mg  Tabs (Zinc) .... .qdtab 13)  Ocuvite Extra   Tabs (Multiple Vitamins-Minerals) .... Two Times A Day 14)  Mucinex 600 Mg  Tb12 (Guaifenesin) .... Take 1 Tablet By Mouth Once A Day 15)  Sennacot .... As Needed 16)  Halcyon .... As Needed 17)  Zithromax Z-Pak 250 Mg Tabs (Azithromycin) .... Use As Directed  Current Medications (  verified): 1)  Spiriva Handihaler 18 Mcg Caps (Tiotropium Bromide Monohydrate) .... One Puff Once Daily 2)  Nasonex 50 Mcg/act  Susp (Mometasone Furoate) .... 2 Sprays Each Nostril Once Daily 3)  Imipramine Hcl 25 Mg  Tabs (Imipramine Hcl) .... Three Times A Day 4)  Synthroid 125 Mcg Tabs (Levothyroxine Sodium) .... One By Mouth Daily 5)  Lantus 80 Unit/ml  Soln (Insulin Glargine) .... Every Day 6)  Nitro-Dur 0.4 Mg/hr  Pt24 (Nitroglycerin) .... Take 1 Tablet Under Tongue As Needed For Chest Pain. 7)   Pravachol 20 Mg Tabs (Pravastatin Sodium) .... One By Mouth Daily 8)  Ticlopidine Hcl 250 Mg  Tabs (Ticlopidine Hcl) .... Take One Tablet Two Times A Day 9)  Amlodipine Besylate 5 Mg Tabs (Amlodipine Besylate) .... One By Mouth Once Daily 10)  Asprin 325mg  .... 2 By Mouth Daily 11)  Nexium 40 Mg Cpdr (Esomeprazole Magnesium) .... One By Mouth Daily 12)  Zinc 50 Mg  Tabs (Zinc) .... .qdtab 13)  Ocuvite Extra   Tabs (Multiple Vitamins-Minerals) .... Two Times A Day 14)  Mucinex 600 Mg  Tb12 (Guaifenesin) .... Take 1 Tablet By Mouth Once A Day 15)  Sennacot .... As Needed 16)  Halcyon .... As Needed 17)  Zithromax Z-Pak 250 Mg Tabs (Azithromycin) .... Use As Directed  Allergies: 1)  ! Lopressor Hct (Metoprolol-Hydrochlorothiazide) 2)  ! Pcn 3)  ! Sulfa  Past History:  Past Medical History: Last updated: 10/28/2008 Coronary artery disease Dressler's Syndrome Hypertension Dyslipidemia Diabetes mellitus CVA 1991 Macular degeneration, legally blind Depression GERD Diverticulosis Irritable bowel syndrome Zenker's diverticulum Pneumonia as a child Tobacco abuse      - Quit 1999, smoked 1 pack per day for 40 years Emphysema      - 10/28/08 spirometry FVC 2.51 (96%), FEV1 1.68 (85%), FEV1 67 Pulmonary hypertension      - 07/25/03 right heart cath PA 35/18, PAOP 16      - 02/18/08 Echo PAS 30 mm Hg Chronic sinusitis Fibromyalgia Hypothyroidism Scleroderma followed by Dr. Dareen Piano Raynauds phenomenon Cervical disk disease  Past Surgical History: Last updated: 10/28/2008 Total abdominal hysterectomy 1973 Bilateral oophorectomy 2002 Spinal fusion and implantation of a plate in 0981 Removal of left lobe thyroid 1991 Cholecystectomy 1965 C3-C4 fusion 10/15/02 Dr. Trey Sailors Appendectomy Cataract  Family History: Last updated: 12/01/07 mother died at age 65 from sepsis father died at age 75 from stroke  Social History: Last updated: 10/28/2008 Divorced Quit tobacco  1999 (40 pack years) No ETOH  Review of Systems       All systems reivewed.  Negative to the above problem except as noted previously.  Vital Signs:  Patient profile:   77 year old female Height:      62 inches Weight:      140.25 pounds BMI:     25.74 Pulse rate:   112 / minute BP sitting:   150 / 68  Vitals Entered By: Ollen Gross, RN, BSN 11-30-2008 9:27 AM)  Physical Exam  General:  Well developed, well nourished, in no acute distress. Head:  normocephalic and atraumatic Mouth:  Teeth, gums and palate normal. Oral mucosa normal. Neck:  Neck supple, no JVD. No masses, thyromegaly or abnormal cervical nodes. Lungs:  Clear bilaterally to auscultation and percussion. Heart:  RRR. S1, S2, no S3.  No signficant murmurs. Abdomen:  Bowel sounds positive; abdomen soft and non-tender without masses, organomegaly. Pulses:  pulses normal in all 4 extremities Extremities:  No clubbing  or cyanosis.   Impression & Recommendations:  Problem # 1:  DYSPNEA (ICD-786.05) Plan for right heart catheterization  to define filling pressures.   I will not routinely schedule a L heart cath with this.  Problem # 2:  CORONARY ARTERY DISEASE (ICD-414.00) Cath as noted.  Continue medical Rx. Her updated medication list for this problem includes:    Nitro-dur 0.4 Mg/hr Pt24 (Nitroglycerin) .[name]... Take 1 tablet under tongue as needed for chest pain.    Ticlopidine Hcl 250 Mg Tabs (Ticlopidine hcl) .[name]... Take one tablet two times a day    Amlodipine Besylate 5 Mg Tabs (Amlodipine besylate) ..... One by mouth once daily  Orders: TLB-CBC Platelet - w/Differential (85025-CBCD) TLB-PT (Protime) (85610-PTP) TLB-PTT (85730-PTTL)  Problem # 3:  HYPERLIPIDEMIA (ICD-272.4) Continue medical Rx. Her updated medication list for this problem includes:    Pravachol 20 Mg Tabs (Pravastatin sodium) ..... One by mouth daily  Other Orders: TLB-BMP (Basic Metabolic Panel-BMET) (80048-METABOL)  Patient  Instructions: 1)  Your physician recommends that you schedule a follow-up appointment in: 4 months with Dr. Tenny Craw

## 2010-07-26 NOTE — Progress Notes (Signed)
Summary: refill  ticlopidine and nexium  Phone Note Refill Request Message from:  Patient on October 01, 2009 10:31 AM  Refills Requested: Medication #1:  NEXIUM 40 MG CPDR one by mouth daily   Supply Requested: 3 months  Medication #2:  TICLOPIDINE HCL 250 MG  TABS Take one tablet two times a day   Supply Requested: 3 months [facility], Please call in before 2   Method Requested: Telephone to Pharmacy Initial call taken by: Migdalia Dk,  October 01, 2009 10:32 AM    Prescriptions: NEXIUM 40 MG CPDR (ESOMEPRAZOLE MAGNESIUM) one by mouth daily  #30 x 6   Entered by:   Burnett Kanaris, CNA   Authorized by:   Sherrill Raring, MD, [facility]   Signed by:   Burnett Kanaris, CNA on 10/01/2009   Method used:   Electronically to        [facility]* (retail)       [address]       [place], [place]  [id number]       Ph: [phone]       Fax: [phone]   RxID:   [id number] TICLOPIDINE HCL 250 MG  TABS (TICLOPIDINE HCL) Take one tablet two times a day  #60 x 6   Entered by:   Burnett Kanaris, CNA   Authorized by:   Sherrill Raring, MD, [facility]   Signed by:   Burnett Kanaris, CNA on 10/01/2009   Method used:   Electronically to        [facility] (retail)       [address]       [place], [place]  [id number]       Ph: [phone]       Fax: [phone]   RxID:   [id number]

## 2010-07-26 NOTE — Assessment & Plan Note (Signed)
Summary: FU AFTER PFT///KWP   Chief Complaint:  Follow-up PFT's.  History of Present Illness: Primary Physician: Georgianne Fick Cardiology: Dietrich Pates Gastroenterology: Sheryn Bison  I saw Ms. Ordway in follow up today for her pulmonary infiltrates, recurrent pneumonia, asthma, and rhinitis.  Since her last visit with me she had blood work done.  Of significance is that her ANA was elevated to 1:160 with a centromere pattern.  She does c/o Raynaud's phenomenon.  She also has difficulty swallowing, but it is difficult to determine if this is from her previous neck surgeries.  She was seen by Dr. Jarold Motto recently from GI.  She has been evaluated by Dr. Phylliss Bob in the past for her fibromyalgia.  She says that using her albuterol helps with her breathing, and she uses this twice per week.  She still has nasal congestion and post-nasal drip.  She had PFT's in my office today which showed normal spirometry without bronchodilator response, normal lung volumes, and mild diffusion defect which corrected for lung volumes.    Current Allergies: ! LOPRESSOR HCT (METOPROLOL-HYDROCHLOROTHIAZIDE) ! PCN ! SULFA      Vital Signs:  Patient Profile:   77 Years Old Female Height:     62 inches Weight:      138 pounds O2 Sat:      97 % O2 treatment:    Room Air Temp:     98.1 degrees F oral Pulse rate:   78 / minute BP sitting:   118 / 64  (left arm)  Vitals Entered By: Michel Bickers CMA (July 08, 2007 12:08 PM)             Comments Medications reviewed.     Physical Exam  Nose:     no sinus tenderness, clear nasal discharge Mouth:     no oral lesions Lungs:     no wheezing or rales Heart:     regular rhythm and normal rate.   Abdomen:     soft, nontender Extremities:     tightening of skin over fingers     Impression & Recommendations:  Problem # 1:  COUGH VARIANT ASTHMA (ICD-493.82) She has gained symptomatic benefit from albuterol which she is using twice  per week.  Therefore I will augment her inhaler regimen by starting asmanex 220 micrograms 2 puffs at bedtime.  I have advised her to rinse her mouth after.  She is to continue on albuterol 2 puffs qid as needed.  Problem # 2:  CHRONIC RHINITIS (ICD-472.0) I will start her on nasonex 2 sprays once daily.   Problem # 3:  SYSTEMIC SCLEROSIS (ICD-710.1) She has skin changes suggestive of scleroderma.  She also has dysphagia, and an elevated ANA with a centromere pattern.  I will have her evaluated by rheumatology.  She has seen Dr. Phylliss Bob previously for fibromyalgia.  She had a right heart catheterization done 07/25/03 which showed PA pressures of 35/18 with a PAOP of 16.  She may need to have follow up echo to further assess depending upon the evaluation of rheumatology.  Problem # 4:  * PULMONARY INFILTRATES I suspect this is more on the basis of her asthma.  I will have her undergo a follow up CXR when she comes back to see me in the office.  Medications Added to Medication List This Visit: 1)  Asmanex 120 Metered Doses 220 Mcg/inh Aepb (Mometasone furoate) .... 2 puffs at bedtime 2)  Nasonex 50 Mcg/act Susp (Mometasone furoate) .... 2 sprays each nostril  once daily 3)  Proair Hfa 108 (90 Base) Mcg/act Aers (Albuterol sulfate) .... 2 puffs qid as needed   Patient Instructions: 1)  Asmanex 2 puffs at night.  RInse your mouth after using. 2)  Nasonex 2 sprays in each nostril daily. 3)  Continue albuterol 2 sprays as needed upto four times per day. 4)  Schedule appointment with Rheumatology. 5)  Follow up in 2 months with Chest Xray.    Prescriptions: NASONEX 50 MCG/ACT  SUSP (MOMETASONE FUROATE) 2 sprays each nostril once daily  #1 x 3   Entered and Authorized by:   Coralyn Helling MD   Signed by:   Coralyn Helling MD on 07/08/2007   Method used:   Print then Give to Patient   RxID:   [id number] ASMANEX 120 METERED DOSES 220 MCG/INH  AEPB (MOMETASONE FUROATE) 2 puffs at bedtime  #1 x 3    Entered and Authorized by:   Coralyn Helling MD   Signed by:   Coralyn Helling MD on 07/08/2007   Method used:   Print then Give to Patient   RxID:   [id number]  ]    SPIROMETRY RESULTS  Date:07/08/2007 height inches: 62 Gender: Female  Parameter  Measured Predicted %Predicted  FVC      2.56        2.53      101  FEV1      1.92        1.91      101  FEV1/FVC      0.75        0.76        FEF25-75      1.49        1.69      88  PEF      5.24        351      1  Post-Bronchodilator  Parameter Measured Change from Baseline  FVC     2.38        95  FEV1     1.81        104  FEV1/FVC      0.76          FEF25-75      1.47        72  PEF     0.44        140  Pulse Oximetry Pulse: 78 O2 Saturation %: 97

## 2010-07-26 NOTE — Assessment & Plan Note (Signed)
Summary: 6 month rov/sl   Visit Type:  Follow-up Referring Provider:  n/a Primary Provider:  Georgianne Fick, MD    History of Present Illness: [name] is a 77 year old woman, who I follow. She has a history of coronary artery disease (status post PTCA, stent to the RCA, with repeat PTCA, stent to the RCA in August 09. In June of this year. She underwent PTCA/drug-eluting stent to the left main.). Since I saw her last in the fall she has been seen extensively in pulmonary.  She says that with the prednisone her breathing has improved significantly. She has more energy, denies chest pains.  Current Medications (verified): 1)  Nasonex 50 Mcg/act  Susp (Mometasone Furoate) .... 2 Sprays Each Nostril Once Daily 2)  Imipramine Hcl 25 Mg  Tabs (Imipramine Hcl) .... 3 Tabs At Bedtimey 3)  Synthroid 125 Mcg Tabs (Levothyroxine Sodium) .... One By Mouth Daily 4)  Lantus 100 Unit/ml Soln (Insulin Glargine) .... 8-12 Units Once Daily 5)  Nitro-Dur 0.4 Mg/hr  Pt24 (Nitroglycerin) .... Take 1 Tablet Under Tongue As Needed For Chest Pain. 6)  Pravachol 20 Mg Tabs (Pravastatin Sodium) .... One By Mouth Daily 7)  Ticlopidine Hcl 250 Mg  Tabs (Ticlopidine Hcl) .... Take One Tablet Two Times A Day 8)  Amlodipine Besylate 10 Mg Tabs (Amlodipine Besylate) .... Once Daily 9)  Aspirin 325 Mg  Tabs (Aspirin) .... One Tablet By Mouth Two Times A Day 10)  Nexium 40 Mg Cpdr (Esomeprazole Magnesium) .... One By Mouth Daily 11)  Ocuvite Extra   Tabs (Multiple Vitamins-Minerals) .... Two Times A Day 12)  Mucinex 600 Mg  Tb12 (Guaifenesin) .... Take 1 Tablet By Mouth Once A Day As Needed 13)  Senokot 8.6 Mg Tabs (Sennosides) .... One Tablet By Mouth Once Daily 14)  Halcyon .... As Needed 15)  Dulcolax Stool Softener 100 Mg Caps (Docusate Sodium) .... One Tablet By Mouth Once Daily 16)  Kristalose 10 Gm  Pack (Lactulose) .[name].. 10 Gm Q Hs 17)  Prednisone 5 Mg Tabs (Prednisone) .... Two Pills Once Daily For 2  Weeks, Then One Pill Once Daily  Allergies (verified): 1)  ! Lopressor Hct (Metoprolol-Hydrochlorothiazide) 2)  ! Pcn 3)  ! Sulfa  Past History:  Past medical, surgical, family and social histories (including risk factors) reviewed, and no changes noted (except as noted below).  Past Medical History: Reviewed history from 06/22/2009 and no changes required. Coronary artery disease Dressler's Syndrome Hypertension Dyslipidemia Diabetes mellitus CVA 1991 Macular degeneration, legally blind Depression GERD Diverticulosis Irritable bowel syndrome Zenker's diverticulum Pneumonia as a child Tobacco abuse      - Quit 1999, smoked 1 pack per day for 40 years Emphysema      - PFT 06/16/09 FEV1 1.99(118%), FVC 2.73(111%), FEV1% 73, TLC 4.60(105%), DLCO 66%, no BD Pulmonary hypertension      - 07/25/03 right heart cath PA 35/18, PAOP 16      - 02/18/08 Echo PAS 30 mm Hg Chronic sinusitis Fibromyalgia Hypothyroidism Scleroderma followed by Dr. Dareen Piano Raynauds phenomenon Cervical disk disease Adenomatous Colon Polyps  Past Surgical History: Reviewed history from 03/09/2009 and no changes required. Total abdominal hysterectomy 1973 Bilateral oophorectomy 2002 Spinal fusion and implantation of a plate in 6578 Removal of left lobe thyroid 1991 Cholecystectomy 1965 C3-C4 fusion 10/15/02 Dr. Trey Sailors Appendectomy Cataract Bronchoscopy 02/22/09 Dr. Coralyn Helling  Family History: Reviewed history from 06/29/2009 and no changes required. mother died at age 4 from sepsis father died at age 74  from stroke No FH of Colon Cancer:  Social History: Reviewed history from 09/03/2009 and no changes required. Retired  Divorced Quit tobacco 1999 (40 pack years) No ETOH  Vital Signs:  Patient profile:   77 year old female Height:      62 inches Weight:      140 pounds Pulse rate:   91 / minute BP sitting:   122 / 71  (left arm) Cuff size:   regular  Vitals Entered By:  Burnett Kanaris, CNA (October 21, 2009 2:17 PM)  Physical Exam  Additional Exam:  patient is in NAD HEENT:  Normocephalic, atraumatic. EOMI, PERRLA.  Neck: JVP is normal. No thyromegaly. No bruits.  Lungs: clear to auscultation. No rales no wheezes.  Heart: Regular rate and rhythm. Normal S1, S2. No S3.   No significant murmurs. PMI not displaced.  Abdomen:  Supple, nontender. Normal bowel sounds. No masses. No hepatomegaly.  Extremities:   Good distal pulses throughout. No lower extremity edema.  Musculoskeletal :moving all extremities.  Neuro:   alert and oriented x3.    EKG  Procedure date:  10/21/2009  Findings:      NSR> 92 bpm.  Sl sagging of the ST segments in the lateral leads.  Impression & Recommendations:  Problem # 1:  CORONARY ARTERY DISEASE (ICD-414.00) Doing well. No symptoms to suggest angina.  Check CBC given that she is on ticlid.  Continue other meds.  Problem # 2:  HYPERLIPIDEMIA (ICD-272.4) Check lipids today.  She ate some cereal at 9 am.  Problem # 3:  SYSTEMIC SCLEROSIS (ICD-710.1) Followed in Pulm and Rheum.  Problem # 4:  DIVERTICULAR DISEASE (ICD-562.10) Due to have a virtual colonoscopy.  Will check BMET.  Other Orders: EKG w/ Interpretation (93000) TLB-BMP (Basic Metabolic Panel-BMET) (80048-METABOL) TLB-Lipid Panel (80061-LIPID) TLB-AST (SGOT) (84450-SGOT) TLB-CBC Platelet - w/Differential (85025-CBCD) Cardiac Rehabilitation (Cardiac Rehab)  Patient Instructions: 1)  Your physician recommends that you return for lab work in:lab work today....we will call you with results. 2)  Your physician wants you to follow-up in:  NOV 2011 You will receive a reminder letter in the mail two months in advance. If you don't receive a letter, please call our office to schedule the follow-up appointment.

## 2010-07-26 NOTE — Assessment & Plan Note (Signed)
Summary: POSS PNEUMONIA////KP   Chief Complaint:  Chest Pain.  History of Present Illness: Primary Physician: Georgianne Fick Cardiology: Dietrich Pates Gastroenterology: Sheryn Bison Rheumatology: Phylliss Bob  77 year old female with known history of  pulmonary infiltrates, recurrent pneumonia, asthma, and rhinitis. 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  PFT's showed normal spirometry without bronchodilator response, normal lung volumes, and mild diffusion defect which corrected for lung volumes. Seen last in 1/09, started on Asmanex did not refill, did not feel this helped.  Was hospitalized last week for Acute MI requiring stent to the RCA.   Presents today for an acute office visit complaining that she developed mid sternal chest pain, increased on inspiration yesterday. tenderness along mid chest. No palpitations, diaphoresis, radiating pain, jaw pain, cough, purulent sputum, fever, drainage, calf pain       Prior Medication List:  OCUVITE EXTRA   TABS (MULTIPLE VITAMINS-MINERALS) two times a day ZINC 50 MG  TABS (ZINC) .qdtab IMIPRAMINE HCL 25 MG  TABS (IMIPRAMINE HCL) three times a day SYNTHROID 112 MCG  TABS (LEVOTHYROXINE SODIUM) Take 1 tablet by mouth once a day COLACE 150 MG/15ML  LIQD (DOCUSATE SODIUM) as needed ZANTAC 150 MAXIMUM STRENGTH 150 MG  TABS (RANITIDINE HCL) Take 1 tablet by mouth once a day STARLIX 120 MG  TABS (NATEGLINIDE) 1 1/2 tabs every day TOPROL XL 25 MG  TB24 (METOPROLOL SUCCINATE) Take 1 tablet by mouth once a day * LANTUS 80 UNIT/ML  SOLN (INSULIN GLARGINE) every day MUCINEX 600 MG  TB12 (GUAIFENESIN) Take 1 tablet by mouth once a day TUMS 500 MG  CHEW (CALCIUM CARBONATE ANTACID) 4 every day as needed GNP MOTION SICKNESS RELIEF 25 MG  TABS (MECLIZINE HCL) as needed ASMANEX 120 METERED DOSES 220 MCG/INH  AEPB (MOMETASONE FUROATE) 2 puffs at bedtime NASONEX 50 MCG/ACT  SUSP (MOMETASONE FUROATE) 2 sprays each nostril once daily PROAIR  HFA 108 (90 BASE) MCG/ACT  AERS (ALBUTEROL SULFATE) 2 puffs qid as needed   Current Allergies (reviewed today): ! LOPRESSOR HCT (METOPROLOL-HYDROCHLOROTHIAZIDE) ! PCN ! SULFA  Past Medical History:    Reviewed history from 06/26/2007 and no changes required:       GASTRITIS (ICD-535.50)       BLINDNESS (ICD-369.4)       ARTHRITIS (ICD-716.90)       THYROID DISORDER (ICD-246.9)       * PULMONARY INFILTRATES       DIVERTICULAR DISEASE (ICD-562.10)       IRRITABLE BOWEL SYNDROME (ICD-564.1)       PNEUMONIA (ICD-486)       DEPRESSION, CHRONIC (ICD-311)       HYPERTENSION (ICD-401.9)       CVA (ICD-434.91)       SINUSITIS, CHRONIC (ICD-473.9)       DIABETES MELLITUS, TYPE II, ON INSULIN (ICD-250.00)       HYPERLIPIDEMIA (ICD-272.4)       CORONARY ARTERY DISEASE (ICD-414.00)       SPONDYLOSIS, CERVICAL (ICD-721.0)       OTHER SPECIFIED DISORDER OF THE ESOPHAGUS (ICD-530.89)       ZENKER'S DIVERTICULUM (ICD-530.6)       DYSPHAGIA UNSPECIFIED (ICD-787.20)       GASTROESOPHAGEAL REFLUX DISEASE, CHRONIC (ICD-530.81)       FLATULENCE ERUCTATION AND GAS PAIN (ICD-787.3)       CONSTIPATION, CHRONIC (ICD-564.09)       ABDOMINAL ADHESIONS (ICD-568.0)          Family History:    Reviewed  history and no changes required:       mother died at age 65 from sepsis       father died at age 82 from stroke  Social History:    Reviewed history and no changes required:   Risk Factors:  Colonoscopy History:    Date of Last Colonoscopy:  12/05/2005   Review of Systems      See HPI   Vital Signs:  Patient Profile:   77 Years Old Female Height:     62 inches Weight:      143.50 pounds O2 Sat:      97 % O2 treatment:    Room Air Temp:     97.4 degrees F oral Pulse rate:   92 / minute BP sitting:   116 / 54  (left arm)  Vitals Entered By: Marijo File CMA (November 27, 2007 11:53 AM)             Is Patient Diabetic? Yes  Comments Medications reviewed with patient  Marijo File  CMA  November 27, 2007 11:54 AM   Ambulatory Pulse Oximetry  Resting; HR__80___    02 Sat__96___  Lap1 (185 feet)   HR__95___   02 Sat__95___ Lap2 (185 feet)   HR__92___   02 Sat__92___    Lap3 (185 feet)   HR__97___   02 Sat__94___  _X__Test Completed without Difficulty ___Test Stopped due to:  Boone Master CNA  November 27, 2007 12:56 PM      Physical Exam  GENERAL:  A/Ox3; pleasant & cooperative.NAD HEENT:  [place]win/AT, EOM-wnl, PERRLA, EACs-clear, TMs-wnl, NOSE-clear, THROAT-clear & wnl. NECK:  Supple w/ fair ROM; no JVD; normal carotid impulses w/o bruits; no thyromegaly or nodules palpated; no lymphadenopathy. CHEST:  Clear to P & A; w/o, wheezes/ rales/ or rhonchi, tender along mid sternum, no eccymosis.  HEART:  RRR, no m/r/g  heard, ABDOMEN:  Soft & nt; nml bowel sounds; no organomegaly or masses detected. EXT: Warm bilat,  no calf pain, edema, clubbing, pulses intact Skin: no rash/lesion      Impression & Recommendations:  Problem # 1:  CHEST PAIN (ICD-786.50) Atypical chest pain. CXR without acute changes. Questionable etiology. Refer to Cardiology.  Stop Fish oil.  tylen as needed pain  follow up cardiology as scheduled Please contact office for sooner follow up if symptoms do not improve or worsen or go ER.  follow up Dr. Craige Cotta 2 weeks.  Orders: Est. Patient Level IV (40981)   Other Orders: T-2 View CXR, Same Day (71020.5TC)   Patient Instructions: 1)  Stop Fish oil.  2)  tylenol as needed pain  3)  follow up cardiology as scheduled 4)  Please contact office for sooner follow up if symptoms do not improve or worsen or go ER.  5)  follow up Dr. Craige Cotta 2 weeks.    ]

## 2010-07-26 NOTE — Letter (Signed)
Summary: Diabetic Instructions  [facility]  [address] [place], [place] [address]   Phone: [phone]  Fax: [phone]    ROHINI JAROSZEWSKI 05-11-1931 MRN: [id number]   _  _   ORAL DIABETIC MEDICATION INSTRUCTIONS  The day before your procedure:   Take your diabetic pill as you do normally  The day of your procedure:   Do not take your diabetic pill    We will check your blood sugar levels during the admission process and again in Recovery before discharging you home  ________________________________________________________________________  _  _   INSULIN (LONG ACTING) MEDICATION INSTRUCTIONS (Lantus, NPH, 70/30, Humulin, Novolin-N)   The day before your procedure:   Take  your regular evening dose    The day of your procedure:   Do not take your morning dose    _  _   INSULIN (SHORT ACTING) MEDICATION INSTRUCTIONS (Regular, Humulog, Novolog)   The day before your procedure:   Do not take your evening dose   The day of your procedure:   Do not take your morning dose   _  _   INSULIN PUMP MEDICATION INSTRUCTIONS  We will contact the physician managing your diabetic care for written dosage instructions for the day before your procedure and the day of your procedure.  Once we have received the instructions, we will contact you.

## 2010-07-26 NOTE — Progress Notes (Signed)
Summary: reaction  Phone Note Call from Patient   Caller: Patient Call For: Daden Mahany Summary of Call: pt having reaction to medication that she during the bronch she had on yesterday Initial call taken by: Rickard Patience,  February 23, 2009 8:18 AM  Follow-up for Phone Call        pt states she had bronch yesterday and believes she is having an allergic reaction to a med they gave her yesterday.  pt c/o "sneezing fits," runny nose with clear nasal drainage, eyes itch and burn, itching around face and head, and mild increased sob.  pt denied a rash.  pt has been taking benadryl with no relief of symptoms.  will forward message to doc of the day to address since VS out of office today.  Aundra Millet Reynolds LPN  February 23, 2009 9:06 AM   Additional Follow-up for Phone Call Additional follow up Details #1::        PER SN---take benadryl 25mg    four times per day---and call in atarax 25mg   #30  1 by mouth every  4 hours as needed for itching.  thanks Marijo File CMA  February 23, 2009 10:06 AM     Additional Follow-up for Phone Call Additional follow up Details #2::    called and spoke with pt.  pt requested an alternative to the Benadryl.  Pt states she has been taking the Benadryl qid since yesterday  with no relief of symptoms  and requests something else to help with the sneezing and runny nose.  please advise.  thank you.  Aundra Millet Reynolds LPN  February 23, 2009 10:15 AM   Additional Follow-up for Phone Call Additional follow up Details #3:: Details for Additional Follow-up Action Taken: Called patient.  She is much improved.  Not sure what reaction was from.  She received versed, fentanyl, and lidocaine during bronchoscopy.  Advised to use benadryl as needed, and call if symptoms recur. Additional Follow-up by: Coralyn Helling MD,  February 24, 2009 2:23 PM

## 2010-07-26 NOTE — Miscellaneous (Signed)
Summary: COLON/PROPOFOL  [facility]  11-26-09 AT 9AM   ABNORMAL VIRTUAL COLON  ...  Clinical Lists Changes  Medications: Added new medication of MOVIPREP 100 GM  SOLR (PEG-KCL-NACL-NASULF-NA ASC-C) As directed - Signed Rx of MOVIPREP 100 GM  SOLR (PEG-KCL-NACL-NASULF-NA ASC-C) As directed;  #1 x 0;  Signed;  Entered by: Clide Cliff RN;  Authorized by: Louis Meckel MD;  Method used: Electronically to [facility] [address], [place], [place]  [id number], Ph: [phone], Fax: [phone] Allergies: Changed allergy or adverse reaction from LOPRESSOR HCT (METOPROLOL-HYDROCHLOROTHIAZIDE) to LOPRESSOR HCT (METOPROLOL-HYDROCHLOROTHIAZIDE) Changed allergy or adverse reaction from PCN to PCN Changed allergy or adverse reaction from SULFA to SULFA    Prescriptions: MOVIPREP 100 GM  SOLR (PEG-KCL-NACL-NASULF-NA ASC-C) As directed  #1 x 0   Entered by:   Clide Cliff RN   Authorized by:   Louis Meckel MD   Signed by:   Clide Cliff RN on 11/18/2009   Method used:   Electronically to        [facility]* (retail)       [address]       [place], [place]  [id number]       Ph: [phone]       Fax: [phone]   RxID:   [id number]

## 2010-07-26 NOTE — Procedures (Signed)
Summary: Upper Endoscopy  Patient: [name] Note: All result statuses are Final unless otherwise noted.  Tests: (1) Upper Endoscopy (EGD)   EGD Upper Endoscopy       DONE     [facility]     [address]     [place], [place]  [address]           ENDOSCOPY PROCEDURE REPORT           PATIENT:  [name], [name]  MR#:  [id number]     BIRTHDATE:  [birth year], 78 yrs. old  GENDER:  female           ENDOSCOPIST:  Vania Rea. Jarold Motto, MD, [facility]     Referred by:           PROCEDURE DATE:  07/14/2009     PROCEDURE:  EGD, diagnostic     ASA CLASS:  Class III     INDICATIONS:  dysphagia           MEDICATIONS:   Fentanyl 50 mcg IV, Versed 5 mg IV     TOPICAL ANESTHETIC:  Exactacain Spray           DESCRIPTION OF PROCEDURE:   After the risks benefits and     alternatives of the procedure were thoroughly explained, informed     consent was obtained.  The LB GIF-H180 [id number] endoscope was     introduced through the mouth and advanced to the second portion of     the duodenum, without limitations.  The instrument was slowly     withdrawn as the mucosa was fully examined.     <<PROCEDUREIMAGES>>           The stomach was entered and closely examined. The antrum,     angularis, and lesser curvature were well visualized, including a     retroflexed view of the cardia and fundus. The stomach wall was     normally distensable. The scope passed easily through the pylorus     into the duodenum.  The esophagus and gastroesophageal junction     were completely normal in appearance. PROMINANT ZENKER'S     DIVERTICULUM NOTED.NO STRICTURE OR ESOPHAGITIS,CANDIDA, ETC.  The     duodenal bulb was normal in appearance, as was the postbulbar     duodenum.    Retroflexed views revealed no abnormalities.    The     scope was then withdrawn from the patient and the procedure     completed.           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 Jarold Motto, MD, Clementeen Graham           CC:  Malva Limes, MD, Coralyn Mark, MD           n.     eSIGNED:   Vania Rea. Patterson at 07/14/2009 12:22 PM  [name], [name], [id number]  Note: An exclamation mark (!) indicates a result that was not dispersed into the flowsheet. Document Creation Date: 07/14/2009 12:22 PM _______________________________________________________________________  (1) Order result status: Final Collection or observation date-time: 07/14/2009 12:13 Requested date-time:  Receipt date-time:  Reported date-time:  Referring Physician:   Ordering Physician: Sheryn Bison [phone]) Specimen Source:  Source: Launa Grill Order Number: [id number] Lab site:   Appended Document: Upper Endoscopy Dr Craige Cotta patineet. Wil forward to him

## 2010-07-26 NOTE — Letter (Signed)
Summary: [facility]  [facility]   Imported By: Marylou Mccoy 11/30/2009 10:47:37  _____________________________________________________________________  External Attachment:    Type:   Image     Comment:   External Document

## 2010-07-26 NOTE — Assessment & Plan Note (Signed)
Summary: SCREEN FOR RECALL COLON/YF   History of Present Illness Visit Type: Follow-up Visit Primary GI MD: Sheryn Bison MD FACP FAGA Primary Provider: Georgianne Fick, MD  Requesting Provider: n/a Chief Complaint: Consult colon. Pt c/o constipation  History of Present Illness:   She has continued severe constipation despite Dulcolax, Senokot, and stool softeners. Colonoscopy several years ago by Dr. Corinda Gubler was unsuccessful. Review of her chart does show a history of colon polyps going back some 25-30 years. She also has been intestinal adhesions with chronic abdominal pain but has not had acute small bowel obstruction. She was endoscopy and suffers from dysphagia related to dry mouth syndrome and a large Zenker's diverticulum. She seemed to be doing well otherwise from a general medical and GI standpoint. She denies anorexia, weight loss, melena, hematochezia, or hepatobiliary symptoms at this time.   GI Review of Systems      Denies abdominal pain, acid reflux, belching, bloating, chest pain, dysphagia with liquids, dysphagia with solids, heartburn, loss of appetite, nausea, vomiting, vomiting blood, weight loss, and  weight gain.      Reports constipation.     Denies anal fissure, black tarry stools, change in bowel habit, diarrhea, diverticulosis, fecal incontinence, heme positive stool, hemorrhoids, irritable bowel syndrome, jaundice, light color stool, liver problems, rectal bleeding, and  rectal pain.    Current Medications (verified): 1)  Nasonex 50 Mcg/act  Susp (Mometasone Furoate) .... 2 Sprays Each Nostril Once Daily 2)  Imipramine Hcl 25 Mg  Tabs (Imipramine Hcl) .... 3 Tabs At Bedtimey 3)  Synthroid 125 Mcg Tabs (Levothyroxine Sodium) .... One By Mouth Daily 4)  Lantus 100 Unit/ml Soln (Insulin Glargine) .... 8-12 Units Once Daily 5)  Nitro-Dur 0.4 Mg/hr  Pt24 (Nitroglycerin) .... Take 1 Tablet Under Tongue As Needed For Chest Pain. 6)  Pravachol 20 Mg Tabs  (Pravastatin Sodium) .... One By Mouth Daily 7)  Ticlopidine Hcl 250 Mg  Tabs (Ticlopidine Hcl) .... Take One Tablet Two Times A Day 8)  Amlodipine Besylate 10 Mg Tabs (Amlodipine Besylate) .... Once Daily 9)  Aspirin 325 Mg  Tabs (Aspirin) .... One Tablet By Mouth Two Times A Day 10)  Nexium 40 Mg Cpdr (Esomeprazole Magnesium) .... One By Mouth Daily 11)  Ocuvite Extra   Tabs (Multiple Vitamins-Minerals) .... Two Times A Day 12)  Mucinex 600 Mg  Tb12 (Guaifenesin) .... Take 1 Tablet By Mouth Once A Day As Needed 13)  Senokot 8.6 Mg Tabs (Sennosides) .... One Tablet By Mouth Once Daily 14)  Halcyon .... As Needed 15)  Prednisone 10 Mg Tabs (Prednisone) .... 1.5 Pills Once Daily For 2 Weeks, Then 1 Pill Once Daily For 2 Weeks, Then 1/2 Pill Once Daily 16)  Dulcolax Stool Softener 100 Mg Caps (Docusate Sodium) .... One Tablet By Mouth Once Daily  Allergies (verified): 1)  ! Lopressor Hct (Metoprolol-Hydrochlorothiazide) 2)  ! Pcn 3)  ! Sulfa  Past History:  Past medical history reviewed for relevance to current acute and chronic problems.  Past Medical History: Reviewed history from 06/22/2009 and no changes required. Coronary artery disease Dressler's Syndrome Hypertension Dyslipidemia Diabetes mellitus CVA 1991 Macular degeneration, legally blind Depression GERD Diverticulosis Irritable bowel syndrome Zenker's diverticulum Pneumonia as a child Tobacco abuse      - Quit 1999, smoked 1 pack per day for 40 years Emphysema      - PFT 06/16/09 FEV1 1.99(118%), FVC 2.73(111%), FEV1% 73, TLC 4.60(105%), DLCO 66%, no BD Pulmonary hypertension      -  07/25/03 right heart cath PA 35/18, PAOP 16      - 02/18/08 Echo PAS 30 mm Hg Chronic sinusitis Fibromyalgia Hypothyroidism Scleroderma followed by Dr. Dareen Piano Raynauds phenomenon Cervical disk disease Adenomatous Colon Polyps  Past Surgical History: Reviewed history from 03/09/2009 and no changes required. Total abdominal  hysterectomy 1973 Bilateral oophorectomy 2002 Spinal fusion and implantation of a plate in 1610 Removal of left lobe thyroid 1991 Cholecystectomy 1965 C3-C4 fusion 10/15/02 Dr. Trey Sailors Appendectomy Cataract Bronchoscopy 02/22/09 Dr. Coralyn Helling  Family History: Reviewed history from 06/29/2009 and no changes required. mother died at age 36 from sepsis father died at age 61 from stroke No FH of Colon Cancer:  Social History: Retired  Divorced Quit tobacco 1999 (40 pack years) No ETOH  Review of Systems       The patient complains of back pain.  The patient denies allergy/sinus, anemia, anxiety-new, arthritis/joint pain, blood in urine, breast changes/lumps, change in vision, confusion, cough, coughing up blood, depression-new, fainting, fatigue, fever, headaches-new, hearing problems, heart murmur, heart rhythm changes, itching, menstrual pain, muscle pains/cramps, night sweats, nosebleeds, pregnancy symptoms, shortness of breath, skin rash, sleeping problems, sore throat, swelling of feet/legs, swollen lymph glands, thirst - excessive, urination - excessive, urination changes/pain, urine leakage, vision changes, and voice change.    Vital Signs:  Patient profile:   77 year old female Height:      62 inches Weight:      140 pounds BMI:     25.70 BSA:     1.64 Pulse rate:   88 / minute Pulse rhythm:   regular BP sitting:   112 / 68  (left arm) Cuff size:   regular  Vitals Entered By: Ok Anis CMA (September 03, 2009 11:38 AM)  Physical Exam  General:  Well developed, well nourished, no acute distress.healthy appearing.   Head:  Normocephalic and atraumatic. Eyes:  PERRLA, no icterus. Abdomen:  Soft, nontender and nondistended. No masses, hepatosplenomegaly or hernias noted. Normal bowel sounds.Mild distention but no masses or tenderness. Psych:  Alert and cooperative. Normal mood and affect.   Impression & Recommendations:  Problem # 1:  DYSPHAGIA  (ICD-787.29) Assessment Unchanged  Problem # 2:  IRRITABLE BOWEL SYNDROME (ICD-564.1) Assessment: Unchanged Trial of Kristalose 15 g at bedtime while continuing other medications. I think she would be a good candidate for CT colonoscopy screening because of her previous incomplete colonoscopy, history of polyps, and chronic adhesions.  Problem # 3:  PULMONARY HYPERTENSION (ICD-416.8) Assessment: Unchanged Continue multiple cardiac medications and cardiology followup.  Patient Instructions: 1)  Copy sent to :  Dr.Ramachardan 2)  Please continue current medications.  3)  CT colonoscopy scheduled 4)  Trial of Kristalose 15 g at bedtime were continued and other GI medications. 5)  Trial of colon Vear Clock health tablets twice a day 6)  The medication list was reviewed and reconciled.  All changed / newly prescribed medications were explained.  A complete medication list was provided to the patient / caregiver.  Appended Document: SCREEN FOR RECALL COLON/YF    Clinical Lists Changes  Medications: Added new medication of KRISTALOSE 10 GM  PACK (LACTULOSE) 10 gm q hs - Signed Rx of KRISTALOSE 10 GM  PACK (LACTULOSE) 10 gm q hs;  #30 x 6;  Signed;  Entered by: Ashok Cordia RN;  Authorized by: Mardella Layman MD [facility];  Method used: Electronically to [facility] [address] [place], [place]  [address], Ph: [phone], Fax: [phone]  Prescriptions: KRISTALOSE 10 GM  PACK (LACTULOSE) 10 gm q hs  #30 x 6   Entered by:   Ashok Cordia RN   Authorized by:   Mardella Layman MD [facility]   Signed by:   Ashok Cordia RN on 09/03/2009   Method used:   Electronically to        [facility] (retail)       [address]       [place], [place]  [id number]       Ph: [phone]       Fax: [phone]   RxID:   [id number]    Appended Document: SCREEN FOR RECALL COLON/YF    Clinical Lists Changes  Orders: Added new Test order of Virtual Colon  ([facility]) - Signed

## 2010-07-26 NOTE — Progress Notes (Signed)
Summary: notes/labs faxed to Dr. Rana Snare  Phone Note Call from Patient Call back at [phone]   Caller: Daughter/Terri Call For: sood Reason for Call: Talk to Nurse Summary of Call: need results of labs and last office notes sent to Dr. Phylliss Bob asap chart ordered Initial call taken by: Eugene Gavia,  July 26, 2007 12:50 PM  Follow-up for Phone Call        Last OV notes and labs sent to Dr. Renaldo Reel office by fax  - [phone] Follow-up by: Marinus Maw,  July 26, 2007 4:43 PM

## 2010-07-26 NOTE — Letter (Signed)
Summary: [facility] Instructions  [facility]  [address] [place], [place] [address]   Phone: [phone]  Fax: [phone]       [name]    1931-02-12    MRN: [id number]        Procedure Day Dorna Bloom:  Farrell Ours  11/26/09     Arrival Time:  8:00AM     Procedure Time:  9:00AM     Location of Procedure:                    [facility] ( Outpatient Registration)                        PREPARATION FOR COLONOSCOPY WITH MOVIPREP   Starting 5 days prior to your procedure 11/21/09 do not eat nuts, seeds, popcorn, corn, beans, peas,  salads, or any raw vegetables.  Do not take any fiber supplements (e.g. Metamucil, Citrucel, and Benefiber).  THE DAY BEFORE YOUR PROCEDURE         DATE: 11/25/09  DAY: THURSDAY  1.  Drink clear liquids the entire day-NO SOLID FOOD  2.  Do not drink anything colored red or purple.  Avoid juices with pulp.  No orange juice.  3.  Drink at least 64 oz. (8 glasses) of fluid/clear liquids during the day to prevent dehydration and help the prep work efficiently.  CLEAR LIQUIDS INCLUDE: Water Jello Ice Popsicles Tea (sugar ok, no milk/cream) Powdered fruit flavored drinks Coffee (sugar ok, no milk/cream) Gatorade Juice: apple, white grape, white cranberry  Lemonade Clear bullion, consomm, broth Carbonated beverages (any kind) Strained chicken noodle soup Hard Candy                             4.  In the morning, mix first dose of MoviPrep solution:    Empty 1 Pouch A and 1 Pouch B into the disposable container    Add lukewarm drinking water to the top line of the container. Mix to dissolve    Refrigerate (mixed solution should be used within 24 hrs)  5.  Begin drinking the prep at 5:00 p.m. The MoviPrep container is divided by 4 marks.   Every 15 minutes drink the solution down to the next mark (approximately 8 oz) until the full liter is complete.   6.  Follow completed prep with 16 oz of clear liquid of your choice  (Nothing red or purple).  Continue to drink clear liquids until bedtime.  7.  Before going to bed, mix second dose of MoviPrep solution:    Empty 1 Pouch A and 1 Pouch B into the disposable container    Add lukewarm drinking water to the top line of the container. Mix to dissolve    Refrigerate  THE DAY OF YOUR PROCEDURE      DATE:  11/26/09   DAY:  FRIDAY  Beginning at 4:00AM (5 hours before procedure):         1. Every 15 minutes, drink the solution down to the next mark (approx 8 oz) until the full liter is complete.  2. Follow completed prep with 16 oz. of clear liquid of your choice.    3. You may drink clear liquids until 12:00 MIDNIGHT except for your Movi Prep.   MEDICATION INSTRUCTIONS  Unless otherwise instructed, you should take regular prescription medications with a small sip of water   as early  as possible the morning of your procedure.  Diabetic patients - see separate instructions.   Additional medication instructions: _Stay on ticlid!         OTHER INSTRUCTIONS  You will need a responsible adult at least 75 years of age to accompany you and drive you home.   This person must remain in the waiting room during your procedure.  Wear loose fitting clothing that is easily removed.  Leave jewelry and other valuables at home.  However, you may wish to bring a book to read or  an iPod/MP3 player to listen to music as you wait for your procedure to start.  Remove all body piercing jewelry and leave at home.  Total time from sign-in until discharge is approximately 2-3 hours.  You should go home directly after your procedure and rest.  You can resume normal activities the  day after your procedure.  The day of your procedure you should not:   Drive   Make legal decisions   Operate machinery   Drink alcohol   Return to work  You will receive specific instructions about eating, activities and medications before you leave.    The above  instructions have been reviewed and explained to me by   Clide Cliff, RN_______________________    I fully understand and can verbalize these instructions _____________________________ Date _________

## 2010-07-26 NOTE — Assessment & Plan Note (Signed)
Summary: rov/jd   Visit Type:  Follow-up Copy to:  Dietrich Pates, Sheryn Bison, Dr. Dareen Piano Primary Provider/Referring Provider:  Georgianne Fick, MD   CC:  The patient c/o a dry hacky cough...sinus drainage.  History of Present Illness: I saw [name] for evaluation of her abnormal CT chest (probable BOOP) in the setting of emphysema, Scleroderma, possible pulmonary hypertension.  She has been off prednisone since August.  She has been feeling more short of breath.  She has started getting low grade temp of 100F again.  She feels run down.  She has sinus congestion, but no worse than usual.  Spirometry today: FEV1 1.94(104%), FVC 2.55(102%), FEV1% 76.  Normal spirometry.   CXR  Procedure date:  02/16/2010  Findings:       PORTABLE CHEST - 1 VIEW    Comparison: Chest radiograph 01/07/2010    Findings: Normal mediastinum and heart silhouette.  Costophrenic   angles are clear.  No evidence of effusion, infiltrate, or   pneumothorax.    IMPRESSION:   No acute cardiopulmonary process.   Current Medications (verified): 1)  Imipramine Hcl 25 Mg  Tabs (Imipramine Hcl) .... 3 Tabs At Bedtimey 2)  Synthroid 125 Mcg Tabs (Levothyroxine Sodium) .... One By Mouth Daily 3)  Lantus 100 Unit/ml Soln (Insulin Glargine) .... 8-12 Units Once Daily 4)  Nitro-Dur 0.4 Mg/hr  Pt24 (Nitroglycerin) .... Take 1 Tablet Under Tongue As Needed For Chest Pain. 5)  Ticlopidine Hcl 250 Mg  Tabs (Ticlopidine Hcl) .... Take One Tablet Two Times A Day 6)  Amlodipine Besylate 10 Mg Tabs (Amlodipine Besylate) .... Once Daily 7)  Aspirin 325 Mg  Tabs (Aspirin) .... One Tablet By Mouth Two Times A Day 8)  Nexium 40 Mg Cpdr (Esomeprazole Magnesium) .... One By Mouth Daily 9)  Ocuvite Extra   Tabs (Multiple Vitamins-Minerals) .... Two Times A Day 10)  Mucinex 600 Mg  Tb12 (Guaifenesin) .... Take 1 Tablet By Mouth Once A Day As Needed 11)  Senokot 8.6 Mg Tabs (Sennosides) .... One Tablet By Mouth Once  Daily 12)  Halcion 0.25 Mg Tabs (Triazolam) .... As Needed 13)  Dulcolax Stool Softener 100 Mg Caps (Docusate Sodium) .... One Tablet By Mouth Once Daily 14)  Miralax  Powd (Polyethylene Glycol 3350) .... Mix One Capfull in 8 Ounce of Water and Drink Every Night 15)  Nasonex 50 Mcg/act Susp (Mometasone Furoate) .... As Needed 16)  Ceftin 500 Mg Tabs (Cefuroxime Axetil) .[name].. 1 By Mouth Two Times A Day  Allergies (verified): 1)  ! Lopressor Hct (Metoprolol-Hydrochlorothiazide) 2)  ! Pcn 3)  ! Sulfa  Past History:  Past Medical History: Coronary artery disease Dressler's Syndrome Hypertension Dyslipidemia Diabetes mellitus CVA 1991 Macular degeneration, legally blind Depression GERD Diverticulosis Irritable bowel syndrome Zenker's diverticulum Pneumonia as a child Tobacco abuse      - Quit 1999, smoked 1 pack per day for 40 years Emphysema      - PFT 06/16/09 FEV1 1.99(118%), FEV1% 73, TLC 4.60(105%), DLCO 66%, no BD Pulmonary hypertension      - 07/25/03 right heart cath PA 35/18, PAOP 16      - 02/18/08 Echo PAS 30 mm Hg Chronic sinusitis Fibromyalgia Hypothyroidism Scleroderma followed by Dr. Dareen Piano Sjogren's Disease Raynauds phenomenon Cervical disk disease Adenomatous Colon Polyps  Past Surgical History: Reviewed history from 03/15/2010 and no changes required. Total abdominal hysterectomy 1973 Bilateral oophorectomy 2002 Spinal fusion and implantation of a plate in 1610 Removal of left lobe thyroid 1991 Cholecystectomy  1965 C3-C4 fusion 10/15/02 Dr. Trey Sailors Appendectomy Cataract Bronchoscopy 02/22/09 Dr. Coralyn Helling Stent Surgery x 3   Vital Signs:  Patient profile:   77 year old female Height:      62 inches (157.48 cm) Weight:      154 pounds (70 kg) BMI:     28.27 O2 Sat:      97 % on Room air Temp:     98.3 degrees F (36.83 degrees C) oral Pulse rate:   86 / minute BP sitting:   138 / 60  (left arm) Cuff size:   regular  Vitals Entered  By: Michel Bickers CMA (May 09, 2010 3:39 PM)  O2 Sat at Rest %:  97 O2 Flow:  Room air CC: The patient c/o a dry hacky cough...sinus drainage Comments Medications reviewed with patient Michel Bickers [facility]  May 09, 2010 3:48 PM   Physical Exam  General:  normal appearance and healthy appearing.   Nose:  no deformity, discharge, inflammation, or lesions Mouth:  no oral exudate, no thrush, stable lesion on tongue Neck:  no JVP Lungs:  coarse breath sounds, but no wheezing or rales Heart:  regular rhythm, normal rate, and no murmurs.   Extremities:  tightening of skin over fingers Neurologic:  strength normal.   Cervical Nodes:  no significant adenopathy Psych:  alert and cooperative; normal mood and affect; normal attention span and concentration   Impression & Recommendations:  Problem # 1:  DYSPNEA (ICD-786.05) Multifactorial.  See below.  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  She had previous good response to prednisone.  She has been off prednisone since August, and has notice worsening of her symptoms.  Will repeat chest xray today to determine if her infiltrates have recurred.  Problem # 3:  SYSTEMIC SCLEROSIS (ICD-710.1) She is followed by Dr. Dareen Piano with rheumatology.  Problem # 4:  EMPHYSEMA (ICD-492.8) She has prior history of smoking.  She has been tried on inhaler therapy before w/o benefit.  Spirometry today was normal.  Problem # 5:  CHRONIC RHINITIS (ICD-472.0) She is to continue on her sinus regimen per primary care.  Medications Added to Medication List This Visit: 1)  Halcion 0.25 Mg Tabs (Triazolam) .... As needed 2)  Nasonex 50 Mcg/act Susp (Mometasone furoate) .... As needed 3)  Ceftin 500 Mg Tabs (Cefuroxime axetil) .[name].. 1 by mouth two times a day  Complete Medication List: 1)  Imipramine Hcl 25 Mg Tabs (Imipramine hcl) .... 3 tabs at bedtimey 2)   Synthroid 125 Mcg Tabs (Levothyroxine sodium) .... One by mouth daily 3)  Lantus 100 Unit/ml Soln (Insulin glargine) .... 8-12 units once daily 4)  Nitro-dur 0.4 Mg/hr Pt24 (Nitroglycerin) .... Take 1 tablet under tongue as needed for chest pain. 5)  Ticlopidine Hcl 250 Mg Tabs (Ticlopidine hcl) .... Take one tablet two times a day 6)  Amlodipine Besylate 10 Mg Tabs (Amlodipine besylate) .... Once daily 7)  Aspirin 325 Mg Tabs (Aspirin) .... One tablet by mouth two times a day 8)  Nexium 40 Mg Cpdr (Esomeprazole magnesium) .... One by mouth daily 9)  Ocuvite Extra Tabs (Multiple vitamins-minerals) .... Two times a day 10)  Mucinex 600 Mg Tb12 (Guaifenesin) .... Take 1 tablet by mouth once a day as needed 11)  Senokot 8.6 Mg Tabs (Sennosides) .... One tablet by mouth once daily 12)  Halcion 0.25 Mg Tabs (Triazolam) .... As needed 13)  Dulcolax Stool Softener 100 Mg Caps (Docusate sodium) .... One tablet by mouth once daily 14)  Miralax Powd (Polyethylene glycol 3350) .... Mix one capfull in 8 ounce of water and drink every night 15)  Nasonex 50 Mcg/act Susp (Mometasone furoate) .... As needed 16)  Ceftin 500 Mg Tabs (Cefuroxime axetil) .[name].. 1 by mouth two times a day  Other Orders: Est. Patient Level III ([address]) Spirometry w/Graph ([address]) T-2 View CXR (71020TC)  Patient Instructions: 1)  Xray today 2)  Follow up in 6 months

## 2010-07-26 NOTE — Assessment & Plan Note (Signed)
Summary: f/u 2 wks ///kp   Primary Provider/Referring Provider:  Dr. Nicholos Johns  CC:  Pt here for bronchoscopy results.   Pt still c/o weakness and fatigue.[name]  History of Present Illness: I saw Ms. [name] for evaluation of her abnormal CT chest in the setting of COPD/emphysema, Scleroderma, possible pulmonary hypertension.  She had bronchoscopy on February 22, 2009.  BAL cultures are negative to date.  Her cytology was also negative for malignacy.  Biopsy was not done due to the patients need to remain on aspirin and plavix for her recent coronary stenting.  She continues to have low grade temperature of 100F in the afternoon.  She denies sweats, but always feels tired.  She still has a cough, but is not bringing up sputum.  She feels like she gets choked more when she coughs.  Her sinuses are okay, and she is not having reflux symptoms.  She denies having any new rashes.  She did feel better when she was on levaquin.  CT chest from October 01, 2008:  Centrilobular emphysema.  Subpleural nodularity in right major fissure.  Multiple RLL nodules, largest 1 x 0.8 cm.  Left lung clear.          Current Medications (verified): 1)  Nasonex 50 Mcg/act  Susp (Mometasone Furoate) .... 2 Sprays Each Nostril Once Daily 2)  Imipramine Hcl 25 Mg  Tabs (Imipramine Hcl) .... Three Times A Day 3)  Synthroid 125 Mcg Tabs (Levothyroxine Sodium) .... One By Mouth Daily 4)  Lantus 80 Unit/ml  Soln (Insulin Glargine) .... Every Day 5)  Nitro-Dur 0.4 Mg/hr  Pt24 (Nitroglycerin) .... Take 1 Tablet Under Tongue As Needed For Chest Pain. 6)  Pravachol 20 Mg Tabs (Pravastatin Sodium) .... One By Mouth Daily 7)  Ticlopidine Hcl 250 Mg  Tabs (Ticlopidine Hcl) .... Take One Tablet Two Times A Day 8)  Amlodipine Besylate 5 Mg Tabs (Amlodipine Besylate) .... One By Mouth Once Daily 9)  Asprin 325mg  .... 2 By Mouth Daily 10)  Nexium 40 Mg Cpdr (Esomeprazole Magnesium) .... One By Mouth Daily 11)  Ocuvite Extra   Tabs  (Multiple Vitamins-Minerals) .... Two Times A Day 12)  Mucinex 600 Mg  Tb12 (Guaifenesin) .... Take 1 Tablet By Mouth Once A Day 13)  Sennacot .... As Needed 14)  Halcyon .... As Needed  Allergies (verified): 1)  ! Lopressor Hct (Metoprolol-Hydrochlorothiazide) 2)  ! Pcn 3)  ! Sulfa  Past History:  Past Medical History: Reviewed history from 10/28/2008 and no changes required. Coronary artery disease Dressler's Syndrome Hypertension Dyslipidemia Diabetes mellitus CVA 1991 Macular degeneration, legally blind Depression GERD Diverticulosis Irritable bowel syndrome Zenker's diverticulum Pneumonia as a child Tobacco abuse      - Quit 1999, smoked 1 pack per day for 40 years Emphysema      - 10/28/08 spirometry FVC 2.51 (96%), FEV1 1.68 (85%), FEV1 67 Pulmonary hypertension      - 07/25/03 right heart cath PA 35/18, PAOP 16      - 02/18/08 Echo PAS 30 mm Hg Chronic sinusitis Fibromyalgia Hypothyroidism Scleroderma followed by Dr. Dareen Piano Raynauds phenomenon Cervical disk disease  Past Surgical History: Total abdominal hysterectomy 1973 Bilateral oophorectomy 2002 Spinal fusion and implantation of a plate in 1610 Removal of left lobe thyroid 1991 Cholecystectomy 1965 C3-C4 fusion 10/15/02 Dr. Trey Sailors Appendectomy Cataract Bronchoscopy 02/22/09 Dr. Coralyn Helling  Family History: Reviewed history from 11/27/2007 and no changes required. mother died at age 73 from sepsis father died at age 51  from stroke  Social History: Reviewed history from 10/28/2008 and no changes required. Divorced Quit tobacco 1999 (40 pack years) No ETOH  Vital Signs:  Patient profile:   77 year old female Height:      62 inches (157.48 cm) Weight:      143.13 pounds (65.06 kg) BMI:     26.27 O2 Sat:      97 % on Room air Temp:     98.2 degrees F (36.78 degrees C) oral Pulse rate:   93 / minute BP sitting:   118 / 72  (left arm) Cuff size:   regular  Vitals Entered By: Michel Bickers CMA (March 09, 2009 2:03 PM)  O2 Flow:  Room air  Physical Exam  General:  normal appearance and healthy appearing.   Nose:  no sinus tenderness, clear nasal discharge Mouth:  no oral lesion Neck:  no JVP Lungs:  coarse breath sounds, but no wheezing or rales Heart:  regular rhythm, normal rate, and no murmurs.   Abdomen:  soft, nontender Extremities:  tightening of skin over fingers Cervical Nodes:  no significant adenopathy   Impression & Recommendations:  Problem # 1:  ABNORMAL CHEST XRAY (ICD-793.1) I am still more suspicious that her radiographic findings represent an infectious process versus inflammatory process.  I am less concerned about malignancy.  She did have symptomatic improvement after recent course of levaquin.  I am wondering if she could be have aspiration related to esophageal dysmotility from her scleroderma.  I will give her a two week course of augmentin, and monitor her clinical response.  In the meantime will await final results from her bronchoscopy AFB and fungal culture.  If these culture results are negative, she remains symptomatic inspite of additional antibiotics, then we may need to give a trial of anti-inflammatory medications.  Problem # 2:  PNEUMONIA (ICD-486) Will give course of augmentin as detailed above.  Problem # 3:  COPD (ICD-496) Does not seem to be an active issue.  Will continue clinical monitoring.  Problem # 4:  SYSTEMIC SCLEROSIS (ICD-710.1) She is to follow up with rheumatology.  Problem # 5:  PULMONARY NODULE (ICD-518.89) As detailed above.  Problem # 6:  CHRONIC RHINITIS (ICD-472.0) Continue symptomatic therapy.  Medications Added to Medication List This Visit: 1)  Augmentin 875-125 Mg Tabs (Amoxicillin-pot clavulanate) .... One by mouth two times a day  Complete Medication List: 1)  Nasonex 50 Mcg/act Susp (Mometasone furoate) .... 2 sprays each nostril once daily 2)  Imipramine Hcl 25 Mg Tabs (Imipramine hcl)  .... Three times a day 3)  Synthroid 125 Mcg Tabs (Levothyroxine sodium) .... One by mouth daily 4)  Lantus 80 Unit/ml Soln (insulin Glargine)  .... Every day 5)  Nitro-dur 0.4 Mg/hr Pt24 (Nitroglycerin) .... Take 1 tablet under tongue as needed for chest pain. 6)  Pravachol 20 Mg Tabs (Pravastatin sodium) .... One by mouth daily 7)  Ticlopidine Hcl 250 Mg Tabs (Ticlopidine hcl) .... Take one tablet two times a day 8)  Amlodipine Besylate 5 Mg Tabs (Amlodipine besylate) .... One by mouth once daily 9)  Asprin 325mg   .... 2 by mouth daily 10)  Nexium 40 Mg Cpdr (Esomeprazole magnesium) .... One by mouth daily 11)  Ocuvite Extra Tabs (Multiple vitamins-minerals) .... Two times a day 12)  Mucinex 600 Mg Tb12 (Guaifenesin) .... Take 1 tablet by mouth once a day 13)  Sennacot  .... As needed 14)  Halcyon  .... As needed 15)  Augmentin  875-125 Mg Tabs (Amoxicillin-pot clavulanate) .... One by mouth two times a day  Other Orders: Est. Patient Level III (09811)  Bronchoscopy  Procedure date:  02/22/2009  Findings:         RT UPPER  Color, Fluid                             COLORLESS  a      YEL  Appearance-Fluid                         CLEAR             CLEAR  WBC Count-FLD                            18                0-1000           cu mm  Segmented Neutrophils-Fld                9                 0-25             %  Lymphocytes-FLUID                        7                                  %  Monocyte-Macrophage-Serous Fluid         81                50-90            %  Eosinophils-Fluid                        3                                  %  Bronchoscopy  Procedure date:  02/22/2009  Findings:       BRONCHIAL WASHING, LUNG, RIGHT UPPER LOBE) (SMEARS):   - REACTIVE BRONCHIAL EPITHELIAL CELLS AND ABUNDANT   HISTIOCYTES, NO GRANULOMA   OR MALIGNANCY IDENTIFIED.   Patient Instructions: 1)  Augmentin one by mouth two times a day for 14 days 2)  Follow up in 2  to 3 weeks  Prescriptions: AUGMENTIN 875-125 MG TABS (AMOXICILLIN-POT CLAVULANATE) one by mouth two times a day  #14 x 0   Entered and Authorized by:   Coralyn Helling MD   Signed by:   Coralyn Helling MD on 03/09/2009   Method used:   Electronically to        [facility]* (retail)       [address]       [place], [place]  [id number]       Ph: [phone]       Fax: [phone]   RxID:   [id number]

## 2010-07-26 NOTE — Assessment & Plan Note (Signed)
Summary: f/u after CT///kp   Current Medications (verified): 1)  Nasonex 50 Mcg/act  Susp (Mometasone Furoate) .... 2 Sprays Each Nostril Once Daily 2)  Imipramine Hcl 25 Mg  Tabs (Imipramine Hcl) .... Three Times A Day 3)  Synthroid 125 Mcg Tabs (Levothyroxine Sodium) .... One By Mouth Daily 4)  Lantus 80 Unit/ml  Soln (Insulin Glargine) .... Every Day 5)  Nitro-Dur 0.4 Mg/hr  Pt24 (Nitroglycerin) .... Take 1 Tablet Under Tongue As Needed For Chest Pain. 6)  Pravachol 20 Mg Tabs (Pravastatin Sodium) .... One By Mouth Daily 7)  Ticlopidine Hcl 250 Mg  Tabs (Ticlopidine Hcl) .... Take One Tablet Two Times A Day 8)  Amlodipine Besylate 5 Mg Tabs (Amlodipine Besylate) .... One By Mouth Once Daily 9)  Asprin 325mg  .... 2 By Mouth Daily 10)  Nexium 40 Mg Cpdr (Esomeprazole Magnesium) .... One By Mouth Daily 11)  Ocuvite Extra   Tabs (Multiple Vitamins-Minerals) .... Two Times A Day 12)  Mucinex 600 Mg  Tb12 (Guaifenesin) .... Take 1 Tablet By Mouth Once A Day 13)  Sennacot .... As Needed 14)  Halcyon .... As Needed  Allergies (verified): 1)  ! Lopressor Hct (Metoprolol-Hydrochlorothiazide) 2)  ! Pcn 3)  ! Sulfa  Past History:  Past Medical History: Reviewed history from 10/28/2008 and no changes required. Coronary artery disease Dressler's Syndrome Hypertension Dyslipidemia Diabetes mellitus CVA 1991 Macular degeneration, legally blind Depression GERD Diverticulosis Irritable bowel syndrome Zenker's diverticulum Pneumonia as a child Tobacco abuse      - Quit 1999, smoked 1 pack per day for 40 years Emphysema      - 10/28/08 spirometry FVC 2.51 (96%), FEV1 1.68 (85%), FEV1 67 Pulmonary hypertension      - 07/25/03 right heart cath PA 35/18, PAOP 16      - 02/18/08 Echo PAS 30 mm Hg Chronic sinusitis Fibromyalgia Hypothyroidism Scleroderma followed by Dr. Dareen Piano Raynauds phenomenon Cervical disk disease  Past Surgical History: Reviewed history from 10/28/2008 and no  changes required. Total abdominal hysterectomy 1973 Bilateral oophorectomy 2002 Spinal fusion and implantation of a plate in 1610 Removal of left lobe thyroid 1991 Cholecystectomy 1965 C3-C4 fusion 10/15/02 Dr. Trey Sailors Appendectomy Cataract   Primary Provider/Referring Provider:  Dr. Nicholos Johns  CC:  Follow-up pulmonary nodule to discuss CT chest..  History of Present Illness: I saw [name] for evaluation of her abnormal CT chest in the setting of COPD/emphysema, Scleroderma, possible pulmonary hypertension.  She had her CT chest from August 10.  This showed shifting infiltrates involving the right upper lobe.  She was given a course of levaquin without improvement in her symptoms.  She continues to have low-grade temperature of 99.6 at night.  She has chest congestion, but is not able to bring up sputum.  She has not had hemoptysis.  She constantly feels short of breath.  She has not noticed wheezing or chest pain.  Her sinuses are ok.  She does occasionally have trouble swallowing.  She used to have sinus problems when she was on avandia, but has been off this for about 2 years.  She had drug-eluting coronary stent done in June by Dr. Juanda Chance, and is on aspirin and ticlid.   CT chest from October 01, 2008:  Centrilobular emphysema.  Subpleural nodularity in right major fissure.  Multiple RLL nodules, largest 1 x 0.8 cm.  Left lung clear.           Family History: Reviewed history from 11/27/2007 and no changes required. mother  died at age 29 from sepsis father died at age 43 from stroke  Social History: Reviewed history from 10/28/2008 and no changes required. Divorced Quit tobacco 1999 (40 pack years) No ETOH  Vital Signs:  Patient profile:   77 year old female Height:      62 inches (157.48 cm) Weight:      145.25 pounds (66.02 kg) BMI:     26.66 O2 Sat:      97 % on Room air Temp:     98.4 degrees F (36.89 degrees C) oral Pulse rate:   101 / minute BP  sitting:   110 / 74  (left arm) Cuff size:   regular  Vitals Entered By: [name] CMA (February 18, 2009 1:34 PM)  O2 Flow:  Room air  Physical Exam  Nose:  no sinus tenderness, clear nasal discharge Mouth:  no oral lesion Neck:  no JVP Lungs:  coarse breath sounds, but no wheezing or rales Heart:  regular rhythm, normal rate, and no murmurs.   Abdomen:  soft, nontender Extremities:  tightening of skin over fingers Cervical Nodes:  no significant adenopathy   Impression & Recommendations:  Problem # 1:  COPD (ICD-496) She did not notice any benefit from spiriva.  Will monitor off inhalers.  Problem # 2:  ABNORMAL CHEST XRAY (ICD-793.1) Her CT chest findings are more suggestive of infection or inflammation, rather than malignancy.  She did not respond to course of antibiotics.  I believe she needs bronchoscopy with airway sampling to further assess.  I discussed this with cardiology, and they have recommended against stopping her aspirin or ticlid.  Therefore will not plan on doing biopsy at time of bronchoscopy due to concern for bleeding.  The procedure has been scheduled for Monday, August 30 at 8 am.  This will be done at [facility].  The procedure was explained to the patient.  Risks were detailed as bleeding, infection, pneumothorax, and non-diagnosis.  The patient was advised to be NPO after midnight on August 29, and have transportation to and from the hospital.  Complete Medication List: 1)  Nasonex 50 Mcg/act Susp (Mometasone furoate) .... 2 sprays each nostril once daily 2)  Imipramine Hcl 25 Mg Tabs (Imipramine hcl) .... Three times a day 3)  Synthroid 125 Mcg Tabs (Levothyroxine sodium) .... One by mouth daily 4)  Lantus 80 Unit/ml Soln (insulin Glargine)  .... Every day 5)  Nitro-dur 0.4 Mg/hr Pt24 (Nitroglycerin) .... Take 1 tablet under tongue as needed for chest pain. 6)  Pravachol 20 Mg Tabs (Pravastatin sodium) .... One by mouth daily 7)  Ticlopidine  Hcl 250 Mg Tabs (Ticlopidine hcl) .... Take one tablet two times a day 8)  Amlodipine Besylate 5 Mg Tabs (Amlodipine besylate) .... One by mouth once daily 9)  Asprin 325mg   .... 2 by mouth daily 10)  Nexium 40 Mg Cpdr (Esomeprazole magnesium) .... One by mouth daily 11)  Ocuvite Extra Tabs (Multiple vitamins-minerals) .... Two times a day 12)  Mucinex 600 Mg Tb12 (Guaifenesin) .... Take 1 tablet by mouth once a day 13)  Sennacot  .... As needed 14)  Halcyon  .... As needed  Other Orders: Est. Patient Level III (36644)  Patient Instructions: 1)  Will call about schedule for bronchoscopy 2)  Follow up in two weeks

## 2010-07-26 NOTE — Assessment & Plan Note (Signed)
Summary: Has many questions about hospital procedure etc./sh   History of Present Illness Visit Type: Follow-up Visit Primary GI MD: Sheryn Bison MD FACP FAGA Primary Provider: Georgianne Fick, MD  Requesting Provider: n/a Chief Complaint: Question about procedure  History of Present Illness:   Continued severe constipation and a 77 year old Caucasian female with known intestinal adhesions and previous and complete colonoscopy by Dr. Corinda Gubler in 2007. Recent CT colonoscopy showed possible carcinoma in the sigmoid colon versus changes from diverticulosis and adhesions. Patient denies rectal bleeding. She has had a long history of multiple colon polyps. Recent scan also showed a right colon lipoma. She denies abdominal pain, nausea vomiting, rectal bleeding, or hepatobiliary or upper GI symptoms. She does have insulin dependent diabetes, coronary artery disease, and he is on aspirin and Ticlid. Her cardiac physicians have not felt that she could stop her anticoagulants for any procedures. She additionally has multiple other medical problems listed and reviewed her chart and is on multiple medications.   GI Review of Systems      Denies abdominal pain, acid reflux, belching, bloating, chest pain, dysphagia with liquids, dysphagia with solids, heartburn, loss of appetite, nausea, vomiting, vomiting blood, weight loss, and  weight gain.        Denies anal fissure, black tarry stools, change in bowel habit, constipation, diarrhea, diverticulosis, fecal incontinence, heme positive stool, hemorrhoids, irritable bowel syndrome, jaundice, light color stool, liver problems, rectal bleeding, and  rectal pain.    Current Medications (verified): 1)  Prednisone 5 Mg Tabs (Prednisone) .[name].. 1 By Mouth Daily 2)  Nasonex 50 Mcg/act  Susp (Mometasone Furoate) .... 2 Sprays Each Nostril Once Daily 3)  Imipramine Hcl 25 Mg  Tabs (Imipramine Hcl) .... 3 Tabs At Bedtimey 4)  Synthroid 125 Mcg Tabs  (Levothyroxine Sodium) .... One By Mouth Daily 5)  Lantus 100 Unit/ml Soln (Insulin Glargine) .... 8-12 Units Once Daily 6)  Nitro-Dur 0.4 Mg/hr  Pt24 (Nitroglycerin) .... Take 1 Tablet Under Tongue As Needed For Chest Pain. 7)  Ticlopidine Hcl 250 Mg  Tabs (Ticlopidine Hcl) .... Take One Tablet Two Times A Day 8)  Amlodipine Besylate 10 Mg Tabs (Amlodipine Besylate) .... Once Daily 9)  Aspirin 325 Mg  Tabs (Aspirin) .... One Tablet By Mouth Two Times A Day 10)  Nexium 40 Mg Cpdr (Esomeprazole Magnesium) .... One By Mouth Daily 11)  Ocuvite Extra   Tabs (Multiple Vitamins-Minerals) .... Two Times A Day 12)  Mucinex 600 Mg  Tb12 (Guaifenesin) .... Take 1 Tablet By Mouth Once A Day As Needed 13)  Senokot 8.6 Mg Tabs (Sennosides) .... One Tablet By Mouth Once Daily 14)  Halcyon .... As Needed 15)  Dulcolax Stool Softener 100 Mg Caps (Docusate Sodium) .... One Tablet By Mouth Once Daily 16)  Kristalose 10 Gm  Pack (Lactulose) .[name].. 10 Gm Q Hs 17)  Pravachol 10 Mg Tabs (Pravastatin Sodium) .[name].. 1 Tab Every Day 18)  Fish Oil 1000 Mg Caps (Omega-3 Fatty Acids) .... 2 Caps Two Times A Day  Allergies (verified): 1)  ! Lopressor Hct (Metoprolol-Hydrochlorothiazide) 2)  ! Pcn 3)  ! Sulfa  Past History:  Past medical, surgical, family and social histories (including risk factors) reviewed for relevance to current acute and chronic problems.  Past Medical History: Reviewed history from 10/25/2009 and no changes required. Coronary artery disease Dressler's Syndrome Hypertension Dyslipidemia Diabetes mellitus CVA 1991 Macular degeneration, legally blind Depression GERD Diverticulosis Irritable bowel syndrome Zenker's diverticulum Pneumonia as a child Tobacco abuse      -  Quit 1999, smoked 1 pack per day for 40 years Emphysema      - PFT 06/16/09 FEV1 1.99(118%), FVC 2.73(111%), FEV1% 73, TLC 4.60(105%), DLCO 66%, no BD Pulmonary hypertension      - 07/25/03 right heart cath PA 35/18,  PAOP 16      - 02/18/08 Echo PAS 30 mm Hg Chronic sinusitis Fibromyalgia Hypothyroidism Scleroderma followed by Dr. Dareen Piano Sjogren's Disease Raynauds phenomenon Cervical disk disease Adenomatous Colon Polyps  Past Surgical History: Reviewed history from 03/09/2009 and no changes required. Total abdominal hysterectomy 1973 Bilateral oophorectomy 2002 Spinal fusion and implantation of a plate in 0454 Removal of left lobe thyroid 1991 Cholecystectomy 1965 C3-C4 fusion 10/15/02 Dr. Trey Sailors Appendectomy Cataract Bronchoscopy 02/22/09 Dr. Coralyn Helling  Family History: Reviewed history from 06/29/2009 and no changes required. mother died at age 62 from sepsis father died at age 73 from stroke No FH of Colon Cancer:  Social History: Reviewed history from 09/03/2009 and no changes required. Retired  Divorced Quit tobacco 1999 (40 pack years) No ETOH  Review of Systems  The patient denies allergy/sinus, anemia, anxiety-new, arthritis/joint pain, back pain, blood in urine, breast changes/lumps, change in vision, confusion, cough, coughing up blood, depression-new, fainting, fatigue, fever, headaches-new, hearing problems, heart murmur, heart rhythm changes, itching, menstrual pain, muscle pains/cramps, night sweats, nosebleeds, pregnancy symptoms, shortness of breath, skin rash, sleeping problems, sore throat, swelling of feet/legs, swollen lymph glands, thirst - excessive , urination - excessive , urination changes/pain, urine leakage, vision changes, and voice change.    Vital Signs:  Patient profile:   77 year old female Height:      62 inches Weight:      140 pounds BMI:     25.70 BSA:     1.64 Pulse rate:   88 / minute Pulse rhythm:   regular BP sitting:   120 / 64  (left arm) Cuff size:   regular  Vitals Entered By: Ok Anis CMA (Nov 19, 2009 11:18 AM)  Physical Exam  General:  Well developed, well nourished, no acute distress.healthy appearing.   Head:   Normocephalic and atraumatic. Eyes:  PERRLA, no icterus. Psych:  Alert and cooperative. Normal mood and affect.   Impression & Recommendations:  Problem # 1:  CONSTIPATION, CHRONIC (ICD-564.09) Assessment Unchanged I spoke with this patient at length about her problems and her recent CT colonoscopy. Again, I think her best chance of success is with propofol sedation with nurse anesthesia assistance. This has been scheduled with Dr. Arlyce Dice at [facility] in pediatric colonoscope. She understands the increased risk of perforation with this procedure but has agreed to proceed as planned. She also did a shaving pictures of her recent laparoscopy which showed multiple pelvic-intestinal adhesions. We'll proceed accordingly depending on the results of her hopefully successful colonoscopy with sedation.  Problem # 2:  CORONARY ARTERY DISEASE (ICD-414.00) Assessment: Improved Will continue Ticlid and aspirin throughout the procedure as recommended by cardiology.  Problem # 3:  DIABETES MELLITUS, TYPE II, ON INSULIN (ICD-250.00) Assessment: Unchanged Appropriate changes and diabetic medications for procedure also outlined with patient  Patient Instructions: 1)  You are scheduled for a colonoscopy at [facility] on 11/26/09. 2)  Please continue current medications.  3)  The medication list was reviewed and reconciled.  All changed / newly prescribed medications were explained.  A complete medication list was provided to the patient / caregiver. 4)  Copy sent to : Dr. Georgianne Fick and Dr. Dietrich Pates in cardiology  5)  Please continue current medications.  6)  Colonoscopy and Flexible Sigmoidoscopy brochure given.  7)  Conscious Sedation brochure given.

## 2010-07-26 NOTE — Miscellaneous (Signed)
Summary: TICLOPIDINE 250MG  not avaiable at [facility]  Clinical Lists Changes LEFT MESSAGE FOR PT TO CALL BACK, TO SEE IF THERE IS ANOTHER PHARMACY WE COULD TRY TO REFILL MED......I ALSO SPOKE TO [facility] AND THEY WILL LOOK INTO TRYING TO FIND IT AT ANOTHER PHARMACY

## 2010-07-26 NOTE — Assessment & Plan Note (Signed)
Summary: rov/sl   Visit Type:  Follow-up Referring Provider:  Azzie Roup Primary Provider:  Dr. Nicholos Johns   History of Present Illness: [name] is a 77 year old woman, who I follow. She has a history of coronary artery disease (status post PTCA, stent to the RCA, with repeat PTCA, stent to the RCA in August 09. In June of this year. She underwent PTCA/drug-eluting stent to the left main.).  Patient has also been followed by Dr. soon to for shortness of breath. He has had extensive evaluation question if she has an atypical pneumonia. Still awaiting some long-term cultures.  On talking to the patient she thinks her breathing may have gotten a little better since she had the left main stented. She wonders if all her lung symptoms date back to her taking Avandia.  She denies chest pain. Again, breathing might be a little better since June. No edema. No PND.  Problems Prior to Update: 1)  Abnormal Chest Xray  (ICD-793.1) 2)  Pneumonia  (ICD-486) 3)  Pulmonary Hypertension  (ICD-416.8) 4)  Pulmonary Nodule  (ICD-518.89) 5)  Coronary Artery Disease  (ICD-414.00) 6)  Hyperlipidemia  (ICD-272.4) 7)  Chest Pain  (ICD-786.50) 8)  Systemic Sclerosis  (ICD-710.1) 9)  Chronic Rhinitis  (ICD-472.0) 10)  COPD  (ICD-496) 11)  Gastritis  (ICD-535.50) 12)  Blindness  (ICD-369.4) 13)  Arthritis  (ICD-716.90) 14)  Thyroid Disorder  (ICD-246.9) 15)  Diverticular Disease  (ICD-562.10) 16)  Irritable Bowel Syndrome  (ICD-564.1) 17)  Depression, Chronic  (ICD-311) 18)  Hypertension  (ICD-401.9) 19)  Cva  (ICD-434.91) 20)  Diabetes Mellitus, Type II, On Insulin  (ICD-250.00) 21)  Spondylosis, Cervical  (ICD-721.0) 22)  Other Specified Disorder of The Esophagus  (ICD-530.89) 23)  Zenker's Diverticulum  (ICD-530.6) 24)  Dysphagia Unspecified  (ICD-787.20) 25)  Gastroesophageal Reflux Disease, Chronic  (ICD-530.81) 26)  Flatulence Eructation and Gas Pain  (ICD-787.3) 27)  Constipation,  Chronic  (ICD-564.09) 28)  Abdominal Adhesions  (ICD-568.0)  Medications Prior to Update: 1)  Nasonex 50 Mcg/act  Susp (Mometasone Furoate) .... 2 Sprays Each Nostril Once Daily 2)  Imipramine Hcl 25 Mg  Tabs (Imipramine Hcl) .... Three Times A Day 3)  Synthroid 125 Mcg Tabs (Levothyroxine Sodium) .... One By Mouth Daily 4)  Lantus 80 Unit/ml  Soln (Insulin Glargine) .... Every Day 5)  Nitro-Dur 0.4 Mg/hr  Pt24 (Nitroglycerin) .... Take 1 Tablet Under Tongue As Needed For Chest Pain. 6)  Pravachol 20 Mg Tabs (Pravastatin Sodium) .... One By Mouth Daily 7)  Ticlopidine Hcl 250 Mg  Tabs (Ticlopidine Hcl) .... Take One Tablet Two Times A Day 8)  Amlodipine Besylate 10 Mg Tabs (Amlodipine Besylate) .... Once Daily 9)  Asprin 325mg  .... 2 By Mouth Daily 10)  Nexium 40 Mg Cpdr (Esomeprazole Magnesium) .... One By Mouth Daily 11)  Ocuvite Extra   Tabs (Multiple Vitamins-Minerals) .... Two Times A Day 12)  Mucinex 600 Mg  Tb12 (Guaifenesin) .... Take 1 Tablet By Mouth Once A Day As Needed 13)  Sennacot .... As Needed 14)  Halcyon .... As Needed  Current Medications (verified): 1)  Nasonex 50 Mcg/act  Susp (Mometasone Furoate) .... 2 Sprays Each Nostril Once Daily 2)  Imipramine Hcl 25 Mg  Tabs (Imipramine Hcl) .... Three Times A Day 3)  Synthroid 125 Mcg Tabs (Levothyroxine Sodium) .... One By Mouth Daily 4)  Lantus 80 Unit/ml  Soln (Insulin Glargine) .... Every Day 5)  Nitro-Dur 0.4 Mg/hr  Pt24 (Nitroglycerin) .... Take 1 Tablet  Under Tongue As Needed For Chest Pain. 6)  Pravachol 20 Mg Tabs (Pravastatin Sodium) .... One By Mouth Daily 7)  Ticlopidine Hcl 250 Mg  Tabs (Ticlopidine Hcl) .... Take One Tablet Two Times A Day 8)  Amlodipine Besylate 10 Mg Tabs (Amlodipine Besylate) .... Once Daily 9)  Asprin 325mg  .... 2 By Mouth Daily 10)  Nexium 40 Mg Cpdr (Esomeprazole Magnesium) .... One By Mouth Daily 11)  Ocuvite Extra   Tabs (Multiple Vitamins-Minerals) .... Two Times A Day 12)  Mucinex  600 Mg  Tb12 (Guaifenesin) .... Take 1 Tablet By Mouth Once A Day As Needed 13)  Sennacot .... As Needed 14)  Halcyon .... As Needed  Allergies (verified): 1)  ! Lopressor Hct (Metoprolol-Hydrochlorothiazide) 2)  ! Pcn 3)  ! Sulfa  Past History:  Past Medical History: Last updated: 10/28/2008 Coronary artery disease Dressler's Syndrome Hypertension Dyslipidemia Diabetes mellitus CVA 1991 Macular degeneration, legally blind Depression GERD Diverticulosis Irritable bowel syndrome Zenker's diverticulum Pneumonia as a child Tobacco abuse      - Quit 1999, smoked 1 pack per day for 40 years Emphysema      - 10/28/08 spirometry FVC 2.51 (96%), FEV1 1.68 (85%), FEV1 67 Pulmonary hypertension      - 07/25/03 right heart cath PA 35/18, PAOP 16      - 02/18/08 Echo PAS 30 mm Hg Chronic sinusitis Fibromyalgia Hypothyroidism Scleroderma followed by Dr. Dareen Piano Raynauds phenomenon Cervical disk disease  Past Surgical History: Last updated: 03/09/2009 Total abdominal hysterectomy 1973 Bilateral oophorectomy 2002 Spinal fusion and implantation of a plate in 1610 Removal of left lobe thyroid 1991 Cholecystectomy 1965 C3-C4 fusion 10/15/02 Dr. Trey Sailors Appendectomy Cataract Bronchoscopy 02/22/09 Dr. Coralyn Helling  Family History: Last updated: 2007/12/17 mother died at age 47 from sepsis father died at age 59 from stroke  Social History: Last updated: 10/28/2008 Divorced Quit tobacco 1999 (40 pack years) No ETOH  Vital Signs:  Patient profile:   77 year old female Height:      62 inches Weight:      141 pounds BMI:     25.88 Pulse rate:   89 / minute BP sitting:   126 / 66  (left arm) Cuff size:   regular  Vitals Entered By: Burnett Kanaris, CNA (April 09, 2009 10:15 AM)  Physical Exam  Additional Exam:  HEENT:  Normocephalic, atraumatic. EOMI, PERRLA.  Neck: JVP is normal. No thyromegaly. No bruits.  Lungs: clear to auscultation. No rales no wheezes.   Heart: Regular rate and rhythm. Normal S1, S2. No S3.   No significant murmurs. PMI not displaced.  Abdomen:  Supple, nontender. Normal bowel sounds. No masses. No hepatomegaly.  Extremities:   Good distal pulses throughout. No lower extremity edema.  Musculoskeletal :moving all extremities.  Neuro:   alert and oriented x3.    EKG  Procedure date:  04/09/2009  Findings:      normal sinus rhythm. 89 beats per minute. Nonspecific ST changes  Impression & Recommendations:  Problem # 1:  CORONARY ARTERY DISEASE (ICD-414.00) I would keep the patient on the same regimen. She needs to stay on Ticlid for at least a year. I'm not sure about her shortness of breath. Again it seems to be a little bit better since the summer Her updated medication list for this problem includes:    Nitro-dur 0.4 Mg/hr Pt24 (Nitroglycerin) .[name]... Take 1 tablet under tongue as needed for chest pain.    Ticlopidine Hcl 250 Mg Tabs (Ticlopidine hcl) .Marland KitchenMarland KitchenMarland KitchenMarland Kitchen  Take one tablet two times a day    Amlodipine Besylate 10 Mg Tabs (Amlodipine besylate) ..... Once daily  Problem # 2:  HYPERLIPIDEMIA (ICD-272.4) patient recently increased, Pravachol to 40 mg, and we'll set her up to fast and have lipids drawn in about 6-8 weeks' time Her updated medication list for this problem includes:    Pravachol 20 Mg Tabs (Pravastatin sodium) ..... One by mouth daily  Problem # 3:  PULMONARY NODULE (ICD-518.89) patient is followed closely by Dr. sued him not sure when she is followup. Again, she's been treated for possible atypical pneumonia.  Patient Instructions: 1)  Your physician recommends that you schedule a follow-up appointment in:  6 months 2)  Your physician recommends that you return for lab work in:  please have dr Chestine Spore draw lipid panel

## 2010-07-26 NOTE — Letter (Signed)
Summary: EGD Instructions  [facility]  [address] [place], [place] [address]   Phone: [phone]  Fax: [phone]       [name]    06/20/1931    MRN: [id number]       Procedure Day Dorna Bloom: Wednesday, 07/14/09     Arrival Time: 10:30     Procedure Time: 11:30     Location of Procedure:                    _ X _ [facility] (4th Floor)    PREPARATION FOR ENDOSCOPY   On 07/14/09 THE DAY OF THE PROCEDURE:  1.   No solid foods, milk or milk products are allowed after midnight the night before your procedure.  2.   Do not drink anything colored red or purple.  Avoid juices with pulp.  No orange juice.  3.  You may drink clear liquids until 9:30, which is 2 hours before your procedure.                                                                                                CLEAR LIQUIDS INCLUDE: Water Jello Ice Popsicles Tea (sugar ok, no milk/cream) Powdered fruit flavored drinks Coffee (sugar ok, no milk/cream) Gatorade Juice: apple, white grape, white cranberry  Lemonade Clear bullion, consomm, broth Carbonated beverages (any kind) Strained chicken noodle soup Hard Candy   MEDICATION INSTRUCTIONS  Unless otherwise instructed, you should take regular prescription medications with a small sip of water as early as possible the morning of your procedure.  Diabetic patients - see separate instructions.                  OTHER INSTRUCTIONS  You will need a responsible adult at least 75 years of age to accompany you and drive you home.   This person must remain in the waiting room during your procedure.  Wear loose fitting clothing that is easily removed.  Leave jewelry and other valuables at home.  However, you may wish to bring a book to read or an iPod/MP3 player to listen to music as you wait for your procedure to start.  Remove all body piercing jewelry and leave at home.  Total time from sign-in until  discharge is approximately 2-3 hours.  You should go home directly after your procedure and rest.  You can resume normal activities the day after your procedure.  The day of your procedure you should not:   Drive   Make legal decisions   Operate machinery   Drink alcohol   Return to work  You will receive specific instructions about eating, activities and medications before you leave.    The above instructions have been reviewed and explained to me by   _______________________    I fully understand and can verbalize these instructions _____________________________ Date _________

## 2010-07-26 NOTE — Miscellaneous (Signed)
Summary: [facility]  [facility]   Imported By: Roderic Ovens 11/11/2009 14:47:11  _____________________________________________________________________  External Attachment:    Type:   Image     Comment:   External Document

## 2010-07-26 NOTE — Progress Notes (Signed)
  Phone Note Call from Patient Call back at Pt. has many questions about [facility]   Reason for Call: Talk to Doctor Details for Reason: Abnormal CT Details of Complaint: Why hospital?  Why Dr. Arlyce Dice? ?  Adhesions? Summary of Call: The patient came in to previsit today very angry, because she thought that she was going to see the Dr..  She thought that she was going to see Dr. Jarold Motto, and asked why Dr. Arlyce Dice was going to do her procedure.  She also wanted to know why he was doing it at the hospital. I explained that the hospital would be the safest place for her to have it since she had so many illnesses.   She just wants to know why about things.  She did sign the permit with Dr. Marzetta Board name on it.  She also signed the papers for [employer].  The patient came close to cancelling everything, but we talked and she did not.  Patient is legally blind and needed everything read to her.  She also has trouble getting a ride since she is blind.  She has an appt. to see Dr. Jarold Motto on 11/19/09 at 11:15am. Initial call taken by: Clide Cliff RN,  Nov 18, 2009 4:33 PM     Appended Document:  Rosalita Chessman spoke to me about this situation. The patient wants to speak to her GI physician Jarold Motto) about this more before proceeding.

## 2010-07-26 NOTE — Assessment & Plan Note (Signed)
Summary: DYSPHAGIA, SCLERODERMA///EM   History of Present Illness Visit Type: consult  Primary GI MD: Sheryn Bison MD FACP FAGA Primary Provider: Georgianne Fick, MD  Requesting Provider: Malva Limes, MD  Chief Complaint: Dysphagia with solids and constipation  History of Present Illness:   This is a 77 year old Caucasian female with CREST syndrome and possible pulmonary scleroderma. She has been a chronic GI patient of Dr. Terrial Rhodes and has a voluminous and extensive chart that took a great deal of time to review in detail today. Basically, she is seen today because of increasing solid food dysphagia  in her distal esophageal area mostly for solid food and pills.  The patient had multiple endoscopic exams and colonoscopies over the last several years and has intestinal adhesions and very redundant and tortuous colon with most recent colonoscopy being incomplete. CT Scan of the abdomen and colon was recommended but not completed. Currently she denies lower GI complaints, melena or hematochezia. She continues to have reflux symptoms despite being on Nexium 40 mg a day. She previously was referred in 1998 at [facility] for evaluation and had an esophageal motility disturbance on esophageal manometry. She also has a known small Zenker's diverticulum.Because of continued dysphagia, she had barium swallow performed on September 17, 2007 which showed high-grade obstruction of a 13 mm barium tablet in the distal esophagus. There also was some tertiary esophageal spasm. That radiograph was ordered by Dr. Chase Picket, her rheumatologist.  Patient also has possible Sjogren's syndrome with extremely dry mouth, and uses a lot of in mints.Despite the above complaints, she has had no weight loss or symptoms of malabsorption. She does have recurrent pulmonary infections, and is currently completing a course of antibiotics per pulmonary. She has rather typical acid reflux symptoms but denies any  hepatobiliary complaints. She has had multiple surgical procedures and has had a previous cervical plate placed in her neck in 1995  associated esophageal perforation and fistulization. She has had total abdominal hysterectomy and bilateral removal of her ovaries, partial thyroidectomy, cholecystectomy, and appendectomy. She has chronic hypothyroidism, fibromyalg   apparently mild pulmonary hypertension, diverticulosis, coronary artery disease with recent stents in the last 2 years per Dr. Juanda Chance, hypertension, hyperlipidemia, emphysema, and adult onset diabetes requiring low-dose insulin therapy because of previous reactions to oral medications. The patient allegedly has multiple allergies and drug sensitivities to penicillin, sulfur, and Plavix. She currently is on Ticlid and aspirin therapy.  Her current rheumatologist is Dr. Dareen Piano at [facility]. She is on approximately 15 medications that are reviewed and listed in her record. She was referred today for possible esophageal manometry and further evaluation of her dysphasia. She has rather severe Raynaud's phenomenon and dry mouth syndrome previously diagnosed.   GI Review of Systems    Reports dysphagia with solids.      Denies abdominal pain, acid reflux, belching, bloating, chest pain, dysphagia with liquids, heartburn, loss of appetite, nausea, vomiting, vomiting blood, weight loss, and  weight gain.      Reports constipation.     Denies anal fissure, black tarry stools, change in bowel habit, diarrhea, diverticulosis, fecal incontinence, heme positive stool, hemorrhoids, irritable bowel syndrome, jaundice, light color stool, liver problems, rectal bleeding, and  rectal pain.    Current Medications (verified): 1)  Nasonex 50 Mcg/act  Susp (Mometasone Furoate) .... 2 Sprays Each Nostril Once Daily 2)  Imipramine Hcl 25 Mg  Tabs (Imipramine Hcl) .... 3 Tabs At Bedtimey 3)  Synthroid 125 Mcg Tabs (Levothyroxine  Sodium) ....  One By Mouth Daily 4)  Lantus 100 Unit/ml Soln (Insulin Glargine) .... 8-12 Units Once Daily 5)  Nitro-Dur 0.4 Mg/hr  Pt24 (Nitroglycerin) .... Take 1 Tablet Under Tongue As Needed For Chest Pain. 6)  Pravachol 20 Mg Tabs (Pravastatin Sodium) .... One By Mouth Daily 7)  Ticlopidine Hcl 250 Mg  Tabs (Ticlopidine Hcl) .... Take One Tablet Two Times A Day 8)  Amlodipine Besylate 10 Mg Tabs (Amlodipine Besylate) .... Once Daily 9)  Aspirin 325 Mg  Tabs (Aspirin) .... One Tablet By Mouth Two Times A Day 10)  Nexium 40 Mg Cpdr (Esomeprazole Magnesium) .... One By Mouth Daily 11)  Ocuvite Extra   Tabs (Multiple Vitamins-Minerals) .... Two Times A Day 12)  Mucinex 600 Mg  Tb12 (Guaifenesin) .... Take 1 Tablet By Mouth Once A Day As Needed 13)  Sennacot .... As Needed 14)  Halcyon .... As Needed 15)  Levaquin 750 Mg Tabs (Levofloxacin) .... One By Mouth Once Daily For 14 Days  Allergies (verified): 1)  ! Lopressor Hct (Metoprolol-Hydrochlorothiazide) 2)  ! Pcn 3)  ! Sulfa  Past History:  Past medical, surgical, family and social histories (including risk factors) reviewed for relevance to current acute and chronic problems.  Past Medical History: Reviewed history from 06/22/2009 and no changes required. Coronary artery disease Dressler's Syndrome Hypertension Dyslipidemia Diabetes mellitus CVA 1991 Macular degeneration, legally blind Depression GERD Diverticulosis Irritable bowel syndrome Zenker's diverticulum Pneumonia as a child Tobacco abuse      - Quit 1999, smoked 1 pack per day for 40 years Emphysema      - PFT 06/16/09 FEV1 1.99(118%), FVC 2.73(111%), FEV1% 73, TLC 4.60(105%), DLCO 66%, no BD Pulmonary hypertension      - 07/25/03 right heart cath PA 35/18, PAOP 16      - 02/18/08 Echo PAS 30 mm Hg Chronic sinusitis Fibromyalgia Hypothyroidism Scleroderma followed by Dr. Dareen Piano Raynauds phenomenon Cervical disk disease Adenomatous Colon Polyps  Past Surgical  History: Reviewed history from 03/09/2009 and no changes required. Total abdominal hysterectomy 1973 Bilateral oophorectomy 2002 Spinal fusion and implantation of a plate in 1324 Removal of left lobe thyroid 1991 Cholecystectomy 1965 C3-C4 fusion 10/15/02 Dr. Trey Sailors Appendectomy Cataract Bronchoscopy 02/22/09 Dr. Coralyn Helling  Family History: Reviewed history from 11/27/2007 and no changes required. mother died at age 96 from sepsis father died at age 86 from stroke No FH of Colon Cancer:  Social History: Reviewed history from 10/28/2008 and no changes required. Divorced Quit tobacco 1999 (40 pack years) No ETOH  Review of Systems       The patient complains of cough.  The patient denies allergy/sinus, anemia, anxiety-new, arthritis/joint pain, back pain, blood in urine, breast changes/lumps, change in vision, confusion, coughing up blood, depression-new, fainting, fatigue, fever, headaches-new, hearing problems, heart murmur, heart rhythm changes, itching, menstrual pain, muscle pains/cramps, night sweats, nosebleeds, pregnancy symptoms, shortness of breath, skin rash, sleeping problems, sore throat, swelling of feet/legs, swollen lymph glands, thirst - excessive , urination - excessive , urination changes/pain, urine leakage, vision changes, and voice change.   General:  Denies fever, chills, sweats, anorexia, fatigue, weakness, malaise, weight loss, and sleep disorder. Eyes:  Complains of vision loss; denies blurring, diplopia, irritation, discharge, scotoma, eye pain, and photophobia; She has severe macular degeneration and is legally blind.. ENT:  Complains of sore throat and hoarseness; denies earache, ear discharge, tinnitus, decreased hearing, nasal congestion, loss of smell, nosebleeds, and difficulty swallowing. CV:  Complains of dyspnea on  exertion; denies chest pains, angina, palpitations, syncope, orthopnea, PND, peripheral edema, and claudication; Since placement of her  cardiac status she denies active cardiovascular symptomatology. She does carry a diagnosis of previous Dressler's syndrome.[name] Resp:  Complains of dyspnea with exercise and cough; denies dyspnea at rest, sputum, wheezing, coughing up blood, and pleurisy. GI:  Complains of difficulty swallowing, indigestion/heartburn, gas/bloating, and constipation; denies pain on swallowing, nausea, vomiting, vomiting blood, abdominal pain, jaundice, diarrhea, change in bowel habits, bloody BM's, black BMs, and fecal incontinence. GU:  Denies urinary burning, blood in urine, nocturnal urination, urinary frequency, and urinary incontinence. MS:  Complains of joint pain / LOM and joint stiffness; denies joint swelling, joint deformity, low back pain, muscle weakness, muscle cramps, muscle atrophy, leg pain at night, leg pain with exertion, and shoulder pain / LOM hand / wrist pain (CTS). Derm:  Complains of dry skin; denies rash, itching, hives, moles, warts, and unhealing ulcers; dry skin associated with crest syndrome.[name] Neuro:  Denies weakness, paralysis, abnormal sensation, seizures, syncope, tremors, vertigo, transient blindness, frequent falls, frequent headaches, difficulty walking, headache, sciatica, radiculopathy other:, restless legs, memory loss, and confusion. Psych:  Denies depression, anxiety, memory loss, suicidal ideation, hallucinations, paranoia, phobia, and confusion. Endo:  Denies cold intolerance, heat intolerance, polydipsia, polyphagia, polyuria, unusual weight change, and hirsutism. Heme:  Denies bruising, bleeding, enlarged lymph nodes, and pagophagia. Allergy:  Denies hives, rash, sneezing, hay fever, and recurrent infections.  Vital Signs:  Patient profile:   77 year old female Height:      62 inches Weight:      139 pounds BMI:     25.52 BSA:     1.64 Pulse rate:   98 / minute Pulse rhythm:   regular BP sitting:   118 / 64  (left arm) Cuff size:   regular  Vitals Entered By: Ok Anis CMA (June 29, 2009 10:52 AM)  Physical Exam  General:  Well developed, well nourished, no acute distress.healthy appearing.   Head:  Normocephalic and atraumatic. Eyes:  exam deferred to patient's ophthalmologist.  She is legally blind and wears occlusive sunglasses. Mouth:  No deformity or lesions, dentition normal.There is dry mucosa but no ulcerative lesions are noted. Neck:  Supple; no masses or thyromegaly. Lungs:  Clear throughout to auscultation. Heart:  Regular rate and rhythm; no murmurs, rubs,  or bruits. Abdomen:  Soft, nontender and nondistended. No masses, hepatosplenomegaly or hernias noted. Normal bowel sounds. Msk:  decreased ROM and arthritic changes.   Extremities:  No clubbing, cyanosis, edema or deformities noted. Neurologic:  Alert and  oriented x4;  grossly normal neurologically. Cervical Nodes:  No significant cervical adenopathy. Inguinal Nodes:  No significant inguinal adenopathy. Psych:  Alert and cooperative. Normal mood and affect.   Impression & Recommendations:  Problem # 1:  GASTROESOPHAGEAL REFLUX DISEASE, CHRONIC (ICD-530.81) Assessment Unchanged She has Raynaud's phenomenon which is associated with lower esophageal sphincter incompetency and ineffective peristalsis in the smooth muscle portion of her distal esophagus. These patients have severe chronic GERD usually requiring PPI therapy several times a day.I suspect she has a peptic stricture of her distal esophagus accounting for her dysphasia. I have scheduled her for endoscopy and esophageal dilatation with careful monitorization.  Problem # 2:  CORONARY ARTERY DISEASE (ICD-414.00) Assessment: Improved Continue Ticlid and Aspirin Therapy.  Problem # 3:  EMPHYSEMA (ICD-492.8) Assessment: Unchanged Probable chronic pulmonary fibrosis perhaps associated with her systemic sclerosis. She is being followed by our pulmonary physicians and is completing currently  a course of antibiotic  therapy.  Problem # 4:  SYSTEMIC SCLEROSIS (ICD-710.1) Assessment: Deteriorated  She has Raynaud's phenomenon and Sjogren's syndrome which will exacerbate  acid reflux damage to her esophagus. She may need esophageal manometry but I think first she should have endoscopy and dilation therapy and increase her Nexium to twice a day dosage.  Orders: EGD SAV (EGD SAV)  Problem # 5:  DYSPHAGIA (ICD-787.29) Assessment: Deteriorated  Probable multiple etiologies including herZenker's diverticulum, previous neck surgery and trauma to her upper esophagus, esophageal dysmotility associated with scleroderma, peptic stricture of her distal esophagus, etc.  Orders: EGD SAV (EGD SAV)  Problem # 6:  PULMONARY HYPERTENSION (ICD-416.8) Assessment: Improved  Problem # 7:  DIABETES MELLITUS, TYPE II, ON INSULIN (ICD-250.00) Assessment: Improved Adjustments will be made in her insulin requirements for endoscopic procedure.  Problem # 8:  DIVERTICULAR DISEASE (ICD-562.10) Assessment: Improved I see no need for attempts again a colonoscopy per her multiple medical problems and multiple medications. As per previous colonoscopy notes, this exam has been unsuccessful 2 occasions. She has severe chronic adhesions from multiple previous surgical procedures.  Problem # 9:  BLINDNESS (ICD-369.4) Assessment: Deteriorated Continued ophthalmologic followup as needed per her macular degeneration  Patient Instructions: 1)  Copy sent to : Dr. Malva Limes 2)  Please continue current medications.  3)  Conscious Sedation brochure given.  4)  Upper Endoscopy with Dilatation brochure given.  5)  The medication list was reviewed and reconciled.  All changed / newly prescribed medications were explained.  A complete medication list was provided to the patient / caregiver.

## 2010-07-26 NOTE — Assessment & Plan Note (Signed)
Summary: 3-4 week return/mhh   Primary Provider/Referring Provider:  Dr. Barrie Dunker  CC:  Pt here for 4 week follow-up. Pt states she feels Spiriva is helping because she is not as short of breath as before.[name]  History of Present Illness: I saw Ms. Vezina for evaluation of her abnormal CT chest in the setting of COPD/emphysema, Scleroderma, possible pulmonary hypertension.  She had an Echo done on May 25.  This was inconclusive with regards to her pulmonary pressures.  She says that Dr. Tenny Craw had recommend she have a right heart catheterization with Dr. Jones Broom.  Her breathing has been better since she started spiriva.  She has been having some allergy symptoms, but these are mild.  She had her amlodipine dose increased by rheumatology.CT chest from October 01, 2008:  Centrilobular emphysema.  Subpleural nodularity in right major fissure.  Multiple RLL nodules, largest 1 x 0.8 cm.  Left lung clear.          Current Medications (verified): 1)  Spiriva Handihaler 18 Mcg Caps (Tiotropium Bromide Monohydrate) .... One Puff Once Daily 2)  Nasonex 50 Mcg/act  Susp (Mometasone Furoate) .... 2 Sprays Each Nostril Once Daily 3)  Imipramine Hcl 25 Mg  Tabs (Imipramine Hcl) .... Three Times A Day 4)  Synthroid 125 Mcg Tabs (Levothyroxine Sodium) .... One By Mouth Daily 5)  Lantus 80 Unit/ml  Soln (Insulin Glargine) .... Every Day 6)  Nitro-Dur 0.4 Mg/hr  Pt24 (Nitroglycerin) .... Take 1 Tablet Under Tongue As Needed For Chest Pain. 7)  Pravachol 20 Mg Tabs (Pravastatin Sodium) .... One By Mouth Daily 8)  Ticlopidine Hcl 250 Mg  Tabs (Ticlopidine Hcl) .... Take One Tablet Two Times A Day 9)  Amlodipine Besylate 5 Mg Tabs (Amlodipine Besylate) .... One By Mouth Once Daily 10)  Asprin 325mg  .... 2 By Mouth Daily 11)  Nexium 40 Mg Cpdr (Esomeprazole Magnesium) .... One By Mouth Daily 12)  Zinc 50 Mg  Tabs (Zinc) .... .qdtab 13)  Ocuvite Extra   Tabs (Multiple Vitamins-Minerals) .... Two Times A  Day 14)  Mucinex 600 Mg  Tb12 (Guaifenesin) .... Take 1 Tablet By Mouth Once A Day 15)  Sennacot .... As Needed 16)  Halcyon .... As Needed 17)  Zithromax Z-Pak 250 Mg Tabs (Azithromycin) .... Use As Directed  Allergies (verified): 1)  ! Lopressor Hct (Metoprolol-Hydrochlorothiazide) 2)  ! Pcn 3)  ! Sulfa  Vital Signs:  Patient profile:   77 year old female Height:      63 inches Weight:      143.50 pounds O2 Sat:      98 % on Room air Temp:     98 degrees F oral Pulse rate:   81 / minute BP sitting:   120 / 64  (right arm) Cuff size:   regular  Vitals Entered By: Carron Curie CMA (Nov 19, 2008 1:54 PM)  O2 Sat at Rest %:  98 O2 Flow:  Room air CC: Pt here for 4 week follow-up. Pt states she feels Spiriva is helping because she is not as short of breath as before. Comments Medications reviewed with patient Carron Curie CMA  Nov 19, 2008 1:57 PM    Physical Exam  Nose:  no sinus tenderness, clear nasal discharge Mouth:  no oral lesion Neck:  no JVP Lungs:  coarse breath sounds, but no wheezing or rales Heart:  regular rhythm, normal rate, and no murmurs.   Abdomen:  soft, nontender Extremities:  tightening of skin over fingers  Cervical Nodes:  no significant adenopathy   Impression & Recommendations:  Problem # 1:  COPD (ICD-496) She has improved with spiriva.  Problem # 2:  PULMONARY NODULE (ICD-518.89) She will need follow up CT chest in July.  Problem # 3:  PULMONARY HYPERTENSION (ICD-416.8) She is under evaluation for this.  She is scheduled to have a right heart catheterization with cardiology.  She has several reasons for this.  These include COPD, Scleroderma, and heart disease.  Depending on the results of her right heart catheterization she may need to be on specific medications for her pulmonary hypertension.  In the meantime will continue to try to optimize her secondary causes.  Problem # 4:  SYSTEMIC SCLEROSIS (ICD-710.1)  Follow up with  Dr. Dareen Piano with rheumatology.  Problem # 5:  SINUSITIS, ACUTE (ICD-461.9) This appears resolved.  Will continue her sinus regimen.  Complete Medication List: 1)  Spiriva Handihaler 18 Mcg Caps (Tiotropium bromide monohydrate) .... One puff once daily 2)  Nasonex 50 Mcg/act Susp (Mometasone furoate) .... 2 sprays each nostril once daily 3)  Imipramine Hcl 25 Mg Tabs (Imipramine hcl) .... Three times a day 4)  Synthroid 125 Mcg Tabs (Levothyroxine sodium) .... One by mouth daily 5)  Lantus 80 Unit/ml Soln (insulin Glargine)  .... Every day 6)  Nitro-dur 0.4 Mg/hr Pt24 (Nitroglycerin) .... Take 1 tablet under tongue as needed for chest pain. 7)  Pravachol 20 Mg Tabs (Pravastatin sodium) .... One by mouth daily 8)  Ticlopidine Hcl 250 Mg Tabs (Ticlopidine hcl) .... Take one tablet two times a day 9)  Amlodipine Besylate 5 Mg Tabs (Amlodipine besylate) .... One by mouth once daily 10)  Asprin 325mg   .... 2 by mouth daily 11)  Nexium 40 Mg Cpdr (Esomeprazole magnesium) .... One by mouth daily 12)  Zinc 50 Mg Tabs (Zinc) .... .qdtab 13)  Ocuvite Extra Tabs (Multiple vitamins-minerals) .... Two times a day 14)  Mucinex 600 Mg Tb12 (Guaifenesin) .... Take 1 tablet by mouth once a day 15)  Sennacot  .... As needed 16)  Halcyon  .... As needed 17)  Zithromax Z-pak 250 Mg Tabs (Azithromycin) .... Use as directed  Other Orders: Est. Patient Level III (16109) Radiology Referral (Radiology)  Patient Instructions: 1)  Will schedule CT chest in July 2)  Follow up after CT chest

## 2010-07-26 NOTE — Assessment & Plan Note (Signed)
Summary: eph   Visit Type:  Follow-up Referring Provider:  na Primary Provider:  Georgianne Fick, MD   CC:  sob and occ chest discomfort.  History of Present Illness: patient is a 77 year old with a history of CAD (s/p IWMI with stent ot RCA.  repeat stnet to the RCA for instent restenois.  She is also s/p DES to L [place].  Admitted this summer for chst pain.  Cath showed only a small OM with stenosis.  Otherwise no significant disease.  SInce then she has not had any further chest pains.  Does get short of breath with activity.    She also has a hisotry of CVAl, htn, dyslipidemia.  Current Medications (verified): 1)  Imipramine Hcl 25 Mg  Tabs (Imipramine Hcl) .... 3 Tabs At Bedtimey 2)  Synthroid 125 Mcg Tabs (Levothyroxine Sodium) .... One By Mouth Daily 3)  Lantus 100 Unit/ml Soln (Insulin Glargine) .... 8-12 Units Once Daily 4)  Nitro-Dur 0.4 Mg/hr  Pt24 (Nitroglycerin) .... Take 1 Tablet Under Tongue As Needed For Chest Pain. 5)  Ticlopidine Hcl 250 Mg  Tabs (Ticlopidine Hcl) .... Take One Tablet Two Times A Day 6)  Amlodipine Besylate 10 Mg Tabs (Amlodipine Besylate) .... Once Daily 7)  Aspirin 325 Mg  Tabs (Aspirin) .... One Tablet By Mouth Two Times A Day 8)  Nexium 40 Mg Cpdr (Esomeprazole Magnesium) .... One By Mouth Daily 9)  Ocuvite Extra   Tabs (Multiple Vitamins-Minerals) .... Two Times A Day 10)  Mucinex 600 Mg  Tb12 (Guaifenesin) .... Take 1 Tablet By Mouth Once A Day As Needed 11)  Senokot 8.6 Mg Tabs (Sennosides) .... One Tablet By Mouth Once Daily 12)  Halcyon .... As Needed 13)  Dulcolax Stool Softener 100 Mg Caps (Docusate Sodium) .... One Tablet By Mouth Once Daily 14)  Miralax  Powd (Polyethylene Glycol 3350) .... Mix One Capfull in 8 Ounce of Water and Drink Every Night 15)  Nasonex .... Prn  Allergies (verified): 1)  ! Lopressor Hct (Metoprolol-Hydrochlorothiazide) 2)  ! Pcn 3)  ! Sulfa  Past History:  Past medical, surgical, family and social  histories (including risk factors) reviewed, and no changes noted (except as noted below).  Past Medical History: Reviewed history from 10/25/2009 and no changes required. Coronary artery disease Dressler's Syndrome Hypertension Dyslipidemia Diabetes mellitus CVA 1991 Macular degeneration, legally blind Depression GERD Diverticulosis Irritable bowel syndrome Zenker's diverticulum Pneumonia as a child Tobacco abuse      - Quit 1999, smoked 1 pack per day for 40 years Emphysema      - PFT 06/16/09 FEV1 1.99(118%), FVC 2.73(111%), FEV1% 73, TLC 4.60(105%), DLCO 66%, no BD Pulmonary hypertension      - 07/25/03 right heart cath PA 35/18, PAOP 16      - 02/18/08 Echo PAS 30 mm Hg Chronic sinusitis Fibromyalgia Hypothyroidism Scleroderma followed by Dr. Dareen Piano Sjogren's Disease Raynauds phenomenon Cervical disk disease Adenomatous Colon Polyps  Past Surgical History: Reviewed history from 03/15/2010 and no changes required. Total abdominal hysterectomy 1973 Bilateral oophorectomy 2002 Spinal fusion and implantation of a plate in 5188 Removal of left lobe thyroid 1991 Cholecystectomy 1965 C3-C4 fusion 10/15/02 Dr. Trey Sailors Appendectomy Cataract Bronchoscopy 02/22/09 Dr. Coralyn Helling Stent Surgery x 3   Family History: Reviewed history from 06/29/2009 and no changes required. mother died at age 31 from sepsis father died at age 75 from stroke No FH of Colon Cancer:  Social History: Reviewed history from 09/03/2009 and no changes required.  Retired  Divorced Quit tobacco 1999 (40 pack years) No ETOH  Review of Systems       All sys reviewed.  Neg to the above probl  Vital Signs:  Patient profile:   77 year old female Height:      62 inches Weight:      139 pounds BMI:     25.52 O2 Sat:      91 % on Room air Pulse rate:   90 / minute BP sitting:   129 / 69  (left arm) Cuff size:   regular  Vitals Entered By: Burnett Kanaris, CNA (March 24, 2010 10:48  AM)  O2 Flow:  Room air  Physical Exam  Additional Exam:  Patient in NAD HEENT:  Normocephalic, atraumatic.[name]  Neck: JVP is normal.  Lungs: clear to auscultation. No rales no wheezes.  Heart: Regular rate and rhythm. Normal S1, S2. No S3.   No significant murmurs. PMI not displaced.  Abdomen:  Supple, nontender. Normal bowel sounds. No masses. No hepatomegaly.  Extremities:   Good distal pulses throughout. No lower extremity edema.  Musculoskeletal :moving all extremities.  Neuro:   alert and oriented x3.    EKG  Procedure date:  03/24/2010  Findings:      NSR.  88 bpm.  T wave inversion V4-V6, II, I, AVF  Impression & Recommendations:  Problem # 1:  CORONARY ARTERY DISEASE (ICD-414.00) Patient with recent cath showing no critical narrowings.   No further chest pains.  I am not sure what lead to spell this summer.  I would keep on same regimen.  Problem # 2:  DYSPNEA (ICD-786.05) We have asked the patient to f/u in pulmonary for reeval.  Problem # 3:  PURE HYPERCHOLESTEROLEMIA (ICD-272.0) Has not tolerated statins.  WIll need t oreview recent lats.  Other Orders: TLB-Udip ONLY (81003-UDIP)  Patient Instructions: 1)  Your physician recommends that you schedule a follow-up appointment in: end of April 2012 2)  Your physician recommends that you continue on your current medications as directed. Please refer to the Current Medication list given to you today. 3)  Call Dr. Evlyn Courier office to make appt.

## 2010-07-26 NOTE — Letter (Signed)
Summary: Diabetic Instructions  [facility]  [address] [place], [place] [address]   Phone: [phone]  Fax: [phone]    [name] 11-Jan-1931 MRN: [id number]   _  _   ORAL DIABETIC MEDICATION INSTRUCTIONS  The day before your procedure:   Take your diabetic pill as you do normally  The day of your procedure:   Do not take your diabetic pill    We will check your blood sugar levels during the admission process and again in Recovery before discharging you home  ________________________________________________________________________  [name]   INSULIN (LONG ACTING) MEDICATION INSTRUCTIONS (Lantus, NPH, 70/30, Humulin, Novolin-N)    The day of your procedure:   Do not take your morning dose    _  _   INSULIN (SHORT ACTING) MEDICATION INSTRUCTIONS (Regular, Humulog, Novolog)   The day before your procedure:   Do not take your evening dose   The day of your procedure:   Do not take your morning dose   _  _   INSULIN PUMP MEDICATION INSTRUCTIONS  We will contact the physician managing your diabetic care for written dosage instructions for the day before your procedure and the day of your procedure.  Once we have received the instructions, we will contact you.

## 2010-07-26 NOTE — Assessment & Plan Note (Signed)
Summary: rov 3-4 wks ///kp   Copy to:  Malva Limes, Sheryn Bison, Margaretmary Bayley, Dietrich Pates Primary Provider/Referring Provider:  Georgianne Fick, MD   CC:  Patient is here for follow-up. She has finished the Levaquin but thinks she may have some thrush in the back of her mouth. She c/o a dry hacky cough..  History of Present Illness: I saw Ms. Christianson for evaluation of her abnormal CT chest in the setting of COPD/emphysema, Scleroderma, possible pulmonary hypertension.  Her sinus symptoms have improved with antibiotics.  She now has a dry cough.  She denies hemoptysis or chest pain.  She thinks she may have thrush.  She still has a low grade temperature in the afternoons up to 99.5.  She is schedule to have an EGD later this month.  She is concerned that her insulin may be causing her respiratory problems.  She is followed by Dr. Chestine Spore for her diabetes. CT chest from October 01, 2008:  Centrilobular emphysema.  Subpleural nodularity in right major fissure.  Multiple RLL nodules, largest 1 x 0.8 cm.  Left lung clear.          Current Medications (verified): 1)  Nasonex 50 Mcg/act  Susp (Mometasone Furoate) .... 2 Sprays Each Nostril Once Daily 2)  Imipramine Hcl 25 Mg  Tabs (Imipramine Hcl) .... 3 Tabs At Bedtimey 3)  Synthroid 125 Mcg Tabs (Levothyroxine Sodium) .... One By Mouth Daily 4)  Lantus 100 Unit/ml Soln (Insulin Glargine) .... 8-12 Units Once Daily 5)  Nitro-Dur 0.4 Mg/hr  Pt24 (Nitroglycerin) .... Take 1 Tablet Under Tongue As Needed For Chest Pain. 6)  Pravachol 20 Mg Tabs (Pravastatin Sodium) .... One By Mouth Daily 7)  Ticlopidine Hcl 250 Mg  Tabs (Ticlopidine Hcl) .... Take One Tablet Two Times A Day 8)  Amlodipine Besylate 10 Mg Tabs (Amlodipine Besylate) .... Once Daily 9)  Aspirin 325 Mg  Tabs (Aspirin) .... One Tablet By Mouth Two Times A Day 10)  Nexium 40 Mg Cpdr (Esomeprazole Magnesium) .... One By Mouth Daily 11)  Ocuvite Extra   Tabs (Multiple  Vitamins-Minerals) .... Two Times A Day 12)  Mucinex 600 Mg  Tb12 (Guaifenesin) .... Take 1 Tablet By Mouth Once A Day As Needed 13)  Sennacot .... As Needed 14)  Halcyon .... As Needed  Allergies (verified): 1)  ! Lopressor Hct (Metoprolol-Hydrochlorothiazide) 2)  ! Pcn 3)  ! Sulfa  Past History:  Past Medical History: Last updated: 06/22/2009 Coronary artery disease Dressler's Syndrome Hypertension Dyslipidemia Diabetes mellitus CVA 1991 Macular degeneration, legally blind Depression GERD Diverticulosis Irritable bowel syndrome Zenker's diverticulum Pneumonia as a child Tobacco abuse      - Quit 1999, smoked 1 pack per day for 40 years Emphysema      - PFT 06/16/09 FEV1 1.99(118%), FVC 2.73(111%), FEV1% 73, TLC 4.60(105%), DLCO 66%, no BD Pulmonary hypertension      - 07/25/03 right heart cath PA 35/18, PAOP 16      - 02/18/08 Echo PAS 30 mm Hg Chronic sinusitis Fibromyalgia Hypothyroidism Scleroderma followed by Dr. Dareen Piano Raynauds phenomenon Cervical disk disease Adenomatous Colon Polyps  Past Surgical History: Last updated: 03/09/2009 Total abdominal hysterectomy 1973 Bilateral oophorectomy 2002 Spinal fusion and implantation of a plate in 3295 Removal of left lobe thyroid 1991 Cholecystectomy 1965 C3-C4 fusion 10/15/02 Dr. Trey Sailors Appendectomy Cataract Bronchoscopy 02/22/09 Dr. Coralyn Helling  Vital Signs:  Patient profile:   77 year old female Height:      62 inches (157.48 cm)  Weight:      143.50 pounds (65.23 kg) BMI:     26.34 O2 Sat:      94 % on Room air Temp:     98.0 degrees F (36.67 degrees C) oral Pulse rate:   95 / minute BP sitting:   124 / 78  (left arm) Cuff size:   regular  Vitals Entered By: Michel Bickers CMA (July 09, 2009 2:33 PM)  O2 Flow:  Room air  Physical Exam  General:  normal appearance and healthy appearing.   Nose:  clear nasal discharge Mouth:  no oral lesion, no thrush Neck:  no JVP Lungs:  coarse breath  sounds, but no wheezing or rales Heart:  regular rhythm, normal rate, and no murmurs.   Abdomen:  soft, nontender Extremities:  tightening of skin over fingers Cervical Nodes:  no significant adenopathy   Impression & Recommendations:  Problem # 1:  ABNORMAL CHEST XRAY (ICD-793.1) She has recurrent episodes of pneumonia and pulmonary infiltrates.  These improve with antibiotics.  I am concerned that she may have recurrent aspiration related to reflux that may be causing this.  Will repeat her chest xray today.  Otherwise will monitor off antibiotics and await results of EGD.  Problem # 2:  EMPHYSEMA (ICD-492.8) Monitor off inhalers.  Problem # 3:  SYSTEMIC SCLEROSIS (ICD-710.1) Per rheumatology.  Problem # 4:  DYSPHAGIA (ZOX-096.04) She is to have EGD later this month.  Complete Medication List: 1)  Nasonex 50 Mcg/act Susp (Mometasone furoate) .... 2 sprays each nostril once daily 2)  Imipramine Hcl 25 Mg Tabs (Imipramine hcl) .... 3 tabs at bedtimey 3)  Synthroid 125 Mcg Tabs (Levothyroxine sodium) .... One by mouth daily 4)  Lantus 100 Unit/ml Soln (Insulin glargine) .... 8-12 units once daily 5)  Nitro-dur 0.4 Mg/hr Pt24 (Nitroglycerin) .... Take 1 tablet under tongue as needed for chest pain. 6)  Pravachol 20 Mg Tabs (Pravastatin sodium) .... One by mouth daily 7)  Ticlopidine Hcl 250 Mg Tabs (Ticlopidine hcl) .... Take one tablet two times a day 8)  Amlodipine Besylate 10 Mg Tabs (Amlodipine besylate) .... Once daily 9)  Aspirin 325 Mg Tabs (Aspirin) .... One tablet by mouth two times a day 10)  Nexium 40 Mg Cpdr (Esomeprazole magnesium) .... One by mouth daily 11)  Ocuvite Extra Tabs (Multiple vitamins-minerals) .... Two times a day 12)  Mucinex 600 Mg Tb12 (Guaifenesin) .... Take 1 tablet by mouth once a day as needed 13)  Sennacot  .... As needed 14)  Halcyon  .... As needed  Other Orders: T-2 View CXR (71020TC) Est. Patient Level III (54098)  Patient  Instructions: 1)  Chest xray today 2)  Follow up in 3 to 4 weeks

## 2010-07-26 NOTE — Progress Notes (Signed)
Summary: CT results- still has fever-pt called back again 8/17  Phone Note Call from Patient Call back at Home Phone [phone]   Caller: Patient Call For: Mosi Hannold Summary of Call: pt c/o low grade fever. has had this "before she saw dr Craige Cotta". says she has seen her pcp who gave her a zpac which she finished. she has now been on another abx x 3 days. wants to know results of CT from 8/10.  Initial call taken by: Tivis Ringer,  February 04, 2009 12:08 PM  Follow-up for Phone Call        Pt anxious to hear about CT results from 02/02/09. She is aware VS is not in the office but we will get a msg to him and call with the results asap after they have been reviewed. Please advise. Michel Bickers CMA  February 04, 2009 3:09 PM  pt called again requesting to hear re: CT results asap. Tivis Ringer  February 09, 2009 10:18 AM  Additional Follow-up for Phone Call Additional follow up Details #1::        Per TD, I paged Dr. Craige Cotta to advise him that pt is anxious to hear about 8/10 CT results. Will await his call.  paged Dr. Craige Cotta again.  Arman Filter LPN  February 09, 2009 12:30 PM   paged Dr. Craige Cotta again.  Arman Filter LPN  February 09, 2009 3:29 PM  Additional Follow-up by: Carron Curie CMA,  February 09, 2009 10:30 AM  New Problems: PNEUMONIA (ICD-486)   Additional Follow-up for Phone Call Additional follow up Details #2::    spoke with dr. Craige Cotta and he is aware of this message.  Aundra Millet Reynolds LPN  February 09, 2009 3:30 PM   Additional Follow-up for Phone Call Additional follow up Details #3:: Details for Additional Follow-up Action Taken: CT chest reviewed.  More consistent with infectious process.  She has hacking cough, but not much sputum.  She has low grade temp up to 100.69F.  She denies sweats, chills, hemoptysis, or chest pain.  She has been on amoxicillin and zithromax by PCP for possible sinus infection.  She says she feels like she did when she had pneumonia.  I have sent a prescription  to her pharmacy for levaquin 750 mg once daily for 7 days.  I have also asked her to stop by the office tomorrow to get sputum cups to collect for gram stain, culture, AFB, and fungal culture.  She is scheduled for ROV with me next week Additional Follow-up by: Coralyn Helling MD,  February 09, 2009 5:32 PM  New Problems: PNEUMONIA (ICD-486) New/Updated Medications: LEVAQUIN 750 MG TABS (LEVOFLOXACIN) one by mouth once daily Prescriptions: LEVAQUIN 750 MG TABS (LEVOFLOXACIN) one by mouth once daily  #7 x 0   Entered and Authorized by:   Coralyn Helling MD   Signed by:   Coralyn Helling MD on 02/09/2009   Method used:   Electronically to        [facility]* (retail)       [address]       [place], [place]  [id number]       Ph: [phone]       Fax: [phone]   RxID:   [id number]

## 2010-07-26 NOTE — Assessment & Plan Note (Signed)
Summary: 2 week return/mh   Chief Complaint:  Two week return - MI on May 27th - low-grade temp in evenings x 2 days; achey.  History of Present Illness: I saw [name] in follow up for her cough variant asthma and rhinitis.  She has been having a low grade temperature of 99.7 for the last several days.  She also has been having pleuritic type chest pain when she takes a breath.  This has been present since she had her heart attack on 05/27.  She has been using her proair two times a day.  She has not used asmanex because it was difficult for her to use.  She continues to use nasonex.      Current Allergies: ! LOPRESSOR HCT (METOPROLOL-HYDROCHLOROTHIAZIDE) ! PCN ! SULFA      Vital Signs:  Patient Profile:   77 Years Old Female Height:     62 inches Weight:      140 pounds O2 Sat:      98 % O2 treatment:    Room Air Temp:     98.5 degrees F oral Pulse rate:   74 / minute BP sitting:   104 / 58  (left arm)  Vitals Entered By: [name] (December 10, 2007 2:00 PM)             Comments Medications reviewed with patient [name]  December 10, 2007 2:11 PM      Physical Exam  Ears:     TMs intact and clear with normal canals Nose:     no sinus tenderness, clear nasal discharge Mouth:     no oral lesions Lungs:     no wheezing or rales Heart:     regular rhythm and normal rate.   Abdomen:     soft, nontender Extremities:     tightening of skin over fingers     Impression & Recommendations:  Problem # 1:  COUGH VARIANT ASTHMA (ICD-493.82) I will switch her to Qvar 40 micrograms 2 puffs two times a day.  She is to continue on proair as needed.   The following medications were removed from the medication list:    Asmanex 120 Metered Doses 220 Mcg/inh Aepb (Mometasone furoate) .[name]... 2 puffs at bedtime  Her updated medication list for this problem includes:    Proair Hfa 108 (90 Base) Mcg/act Aers (Albuterol sulfate) .[name]... 2 puffs qid as  needed    Qvar 40 Mcg/act Aers (Beclomethasone dipropionate) .[name]... 2 puffs two times a day  Orders: Est. Patient Level II (54098)   Problem # 2:  CHRONIC RHINITIS (ICD-472.0) She is to continue on nasonex.  Orders: Est. Patient Level II (11914)   Problem # 3:  CHEST PAIN (ICD-786.50) This seems more pleuritic.  I have advised her to try taking motrin for the next day or two.  She has noticed this since her MI.  She has also been having a low grade temperature.  If this persists, then I wonder if she may have Dressler's syndrome.   Orders: Est. Patient Level II (78295)   Problem # 4:  SYSTEMIC SCLEROSIS (ICD-710.1) She is to follow up with Dr. Phylliss Bob.   Orders: Est. Patient Level II (62130)   Medications Added to Medication List This Visit: 1)  Qvar 40 Mcg/act Aers (Beclomethasone dipropionate) .... 2 puffs two times a day 2)  Nitro-dur 0.4 Mg/hr Pt24 (Nitroglycerin) .... Take 1 tablet under tongue as needed for chest pain. 3)  Pravachol 10 Mg  Tabs (Pravastatin sodium) .... Take one tablet at bedtime 4)  Metoprolol Succinate 25 Mg Tb24 (Metoprolol succinate) .... Take one tablet daily. 5)  Ticlopidine Hcl 250 Mg Tabs (Ticlopidine hcl) .... Take one tablet two times a day    Prescriptions: QVAR 40 MCG/ACT  AERS (BECLOMETHASONE DIPROPIONATE) 2 puffs two times a day  #1 x 2   Entered and Authorized by:   Coralyn Helling MD   Signed by:   Coralyn Helling MD on 12/10/2007   Method used:   Print then Give to Patient   RxID:   [id number]  ]  Appended Document: 2 week return/mh        Current Allergies: ! LOPRESSOR HCT (METOPROLOL-HYDROCHLOROTHIAZIDE) ! PCN ! SULFA         Patient Instructions: 1)  Qvar 2 puffs two times a day, and rinse your mouth after each use 2)  Stop Asmanex 3)  Continue proair 2 puffs upto 4 times per day as needed 4)  Continue nasonex 2 sprays two times a day as needed  5)  Try motrin for the next one or two days for chest pain 6)   Follow up in 4 months   ]

## 2010-07-26 NOTE — Assessment & Plan Note (Signed)
Summary: 10:15/eph/kfw   Visit Type:  Follow-up Primary Provider:  Dr. Barrie Dunker  CC:  continues to have sob.  History of Present Illness: [name] is a 77 year old woman, who I follow in clinic. She has a history of CAD (status post PTCA/stent in August of 2009 for in-stent restenosis to the RCA. She has since seen undergone repeat cardiac catheterization for persistent shortness of breath.  she underwent right and left heart catheterization early in June. Right heart pressures were not significantly elevated. The left main appeared to have significant narrowing. No significant progression of disease though elsewhere.  Review of her heart catheterization with Dr. Juanda Chance and Dr. Gala Romney the decision was made to stent her left main. She underwent PTCA/stent placement (drug-eluting) of the left main. Procedure was without complications.  Since discharge from hospital she still really has noted no change in her breathing. She remains short main short of breath with activity. Denies chest pain.  Problems Prior to Update: 1)  Dyspnea  (ICD-786.05) 2)  Pulmonary Hypertension  (ICD-416.8) 3)  Sinusitis, Acute  (ICD-461.9) 4)  Pulmonary Nodule  (ICD-518.89) 5)  Coronary Artery Disease  (ICD-414.00) 6)  Hyperlipidemia  (ICD-272.4) 7)  Chest Pain  (ICD-786.50) 8)  Systemic Sclerosis  (ICD-710.1) 9)  Chronic Rhinitis  (ICD-472.0) 10)  COPD  (ICD-496) 11)  Gastritis  (ICD-535.50) 12)  Blindness  (ICD-369.4) 13)  Arthritis  (ICD-716.90) 14)  Thyroid Disorder  (ICD-246.9) 15)  Diverticular Disease  (ICD-562.10) 16)  Irritable Bowel Syndrome  (ICD-564.1) 17)  Depression, Chronic  (ICD-311) 18)  Hypertension  (ICD-401.9) 19)  Cva  (ICD-434.91) 20)  Diabetes Mellitus, Type II, On Insulin  (ICD-250.00) 21)  Spondylosis, Cervical  (ICD-721.0) 22)  Other Specified Disorder of The Esophagus  (ICD-530.89) 23)  Zenker's Diverticulum  (ICD-530.6) 24)  Dysphagia Unspecified  (ICD-787.20) 25)   Gastroesophageal Reflux Disease, Chronic  (ICD-530.81) 26)  Flatulence Eructation and Gas Pain  (ICD-787.3) 27)  Constipation, Chronic  (ICD-564.09) 28)  Abdominal Adhesions  (ICD-568.0)  Medications Prior to Update: 1)  Spiriva Handihaler 18 Mcg Caps (Tiotropium Bromide Monohydrate) .... One Puff Once Daily 2)  Nasonex 50 Mcg/act  Susp (Mometasone Furoate) .... 2 Sprays Each Nostril Once Daily 3)  Imipramine Hcl 25 Mg  Tabs (Imipramine Hcl) .... Three Times A Day 4)  Synthroid 125 Mcg Tabs (Levothyroxine Sodium) .... One By Mouth Daily 5)  Lantus 80 Unit/ml  Soln (Insulin Glargine) .... Every Day 6)  Nitro-Dur 0.4 Mg/hr  Pt24 (Nitroglycerin) .... Take 1 Tablet Under Tongue As Needed For Chest Pain. 7)  Pravachol 20 Mg Tabs (Pravastatin Sodium) .... One By Mouth Daily 8)  Ticlopidine Hcl 250 Mg  Tabs (Ticlopidine Hcl) .... Take One Tablet Two Times A Day 9)  Amlodipine Besylate 5 Mg Tabs (Amlodipine Besylate) .... One By Mouth Once Daily 10)  Asprin 325mg  .... 2 By Mouth Daily 11)  Nexium 40 Mg Cpdr (Esomeprazole Magnesium) .... One By Mouth Daily 12)  Zinc 50 Mg  Tabs (Zinc) .... .qdtab 13)  Ocuvite Extra   Tabs (Multiple Vitamins-Minerals) .... Two Times A Day 14)  Mucinex 600 Mg  Tb12 (Guaifenesin) .... Take 1 Tablet By Mouth Once A Day 15)  Sennacot .... As Needed 16)  Halcyon .... As Needed 17)  Zithromax Z-Pak 250 Mg Tabs (Azithromycin) .... Use As Directed  Current Medications (verified): 1)  Spiriva Handihaler 18 Mcg Caps (Tiotropium Bromide Monohydrate) .... One Puff Once Daily 2)  Nasonex 50 Mcg/act  Susp (Mometasone Furoate) .... 2  Sprays Each Nostril Once Daily 3)  Imipramine Hcl 25 Mg  Tabs (Imipramine Hcl) .... Three Times A Day 4)  Synthroid 125 Mcg Tabs (Levothyroxine Sodium) .... One By Mouth Daily 5)  Lantus 80 Unit/ml  Soln (Insulin Glargine) .... Every Day 6)  Nitro-Dur 0.4 Mg/hr  Pt24 (Nitroglycerin) .... Take 1 Tablet Under Tongue As Needed For Chest Pain. 7)   Pravachol 20 Mg Tabs (Pravastatin Sodium) .... One By Mouth Daily 8)  Ticlopidine Hcl 250 Mg  Tabs (Ticlopidine Hcl) .... Take One Tablet Two Times A Day 9)  Amlodipine Besylate 5 Mg Tabs (Amlodipine Besylate) .... One By Mouth Once Daily 10)  Asprin 325mg  .... 2 By Mouth Daily 11)  Nexium 40 Mg Cpdr (Esomeprazole Magnesium) .... One By Mouth Daily 12)  Ocuvite Extra   Tabs (Multiple Vitamins-Minerals) .... Two Times A Day 13)  Mucinex 600 Mg  Tb12 (Guaifenesin) .... Take 1 Tablet By Mouth Once A Day 14)  Sennacot .... As Needed 15)  Halcyon .... As Needed  Allergies: 1)  ! Lopressor Hct (Metoprolol-Hydrochlorothiazide) 2)  ! Pcn 3)  ! Sulfa  Past History:  Past Medical History: Last updated: 10/28/2008 Coronary artery disease Dressler's Syndrome Hypertension Dyslipidemia Diabetes mellitus CVA 1991 Macular degeneration, legally blind Depression GERD Diverticulosis Irritable bowel syndrome Zenker's diverticulum Pneumonia as a child Tobacco abuse      - Quit 1999, smoked 1 pack per day for 40 years Emphysema      - 10/28/08 spirometry FVC 2.51 (96%), FEV1 1.68 (85%), FEV1 67 Pulmonary hypertension      - 07/25/03 right heart cath PA 35/18, PAOP 16      - 02/18/08 Echo PAS 30 mm Hg Chronic sinusitis Fibromyalgia Hypothyroidism Scleroderma followed by Dr. Dareen Piano Raynauds phenomenon Cervical disk disease  Past Surgical History: Last updated: 10/28/2008 Total abdominal hysterectomy 1973 Bilateral oophorectomy 2002 Spinal fusion and implantation of a plate in 8469 Removal of left lobe thyroid 1991 Cholecystectomy 1965 C3-C4 fusion 10/15/02 Dr. Trey Sailors Appendectomy Cataract  Family History: Last updated: Nov 29, 2007 mother died at age 71 from sepsis father died at age 75 from stroke  Social History: Last updated: 10/28/2008 Divorced Quit tobacco 1999 (40 pack years) No ETOH  Review of Systems       is reviewed. Negative to the above problem except as  noted above.  Vital Signs:  Patient profile:   77 year old female Height:      62 inches Weight:      142 pounds Pulse rate:   99 / minute BP sitting:   123 / 62  Vitals Entered By: Burnett Kanaris, CNA (December 25, 2008 11:06 AM)  Physical Exam  Additional Exam:  HEENT:  Normocephalic, atraumatic. EOMI, PERRLA.  Neck: JVP is normal. No thyromegaly. No bruits.  Lungs: clear to auscultation. No rales no wheezes.  Heart: Regular rate and rhythm. Normal S1, S2. No S3.   No significant murmurs. PMI not displaced.  Abdomen:  Supple, nontender. Normal bowel sounds. No masses. No hepatomegaly.  Extremities:   Good distal pulses throughout. No lower extremity edema.  Musculoskeletal :moving all extremities.  Neuro:   alert and oriented x3.    EKG  Procedure date:  12/25/2008  Findings:      normal sinus rhythm. 94 beats per minute. T Wave inversion inferolaterally unchanged from previous.  Cardiac Cath  Procedure date:  12/04/2008  Findings:       CONCLUSION:  Successful stenting of an unprotected left main ostial  and   mid shaft lesion using a XIENCE drug-eluting stent and IVUS guidance   with improvement in center narrowing from 80% to 0%.      Cardiac Cath  Procedure date:  11/30/2008  Findings:       HEMODYNAMICS:  Right atrium mean of 6, RV of 25/5, PA 27/11 with a mean   of 18.  Pulmonary capillary wedge pressure mean of 11.  Central aortic   pressure is 130/53 with a mean of 88.  LV pressure 131/19 with a mean of   22.  There is no aortic stenosis on pullback.      There was a 20-mm gradient between central aorta and the left main.      CORONARY ANGIOGRAPHY:  Left main:  We took multiple shots of this.  On   selective imaging, there appeared to be a high-grade ostial lesion.  On   nonselective imaging, this appeared not to be a significant, but there   was some question of a napkin ring lesion in the ostial portion.  There   was a 20-mm gradient between the aorta  and the left main.  Estimated the   stenosis at about 60-70%.      LAD was a long vessel coursing to the apex and gave off a very large   proximal first diagonal and the proximal LAD of the takeoff of diagonal   was a 20% lesion.  There was a 40% stenosis in the mid LAD.      Circumflex gave off a tiny OM-1, a small OM-2, and a small OM-3.  There   was a 60% ostial lesion in the circumflex and a 60% lesion in the distal   AV groove.  There was a 90% ostial lesion and a very tiny OM-1.      Right coronary artery was a large dominant vessel and gave off PDA and 2   posterolaterals.  In the proximal portion of the RCA, there was a 40%   stenosis.  There is a long section of stenting throughout the   midsection.  This was widely patent.  Just after the stent, there was a   30-40% lesion in the distal RCA.  In the ostium of the PDA, there was a   40% lesion.      LEFT VENTRICULOGRAM:  It showed an EF of 65% with no regional wall   motion abnormalities  Impression & Recommendations:  Problem # 1:  DYSPNEA (ICD-786.05) based on all she has done recently. I am not convinced her dyspnea is due to cardiac ischemia. She does have an appointment in pulmonary to hold her to keep this. Continue medicine. Her updated medication list for this problem includes:    Amlodipine Besylate 5 Mg Tabs (Amlodipine besylate) ..... One by mouth once daily  Problem # 2:  CORONARY ARTERY DISEASE (ICD-414.00) Reports as noted. Patient to be maintained on Ticlid therapy, and aspirin. Her updated medication list for this problem includes:    Nitro-dur 0.4 Mg/hr Pt24 (Nitroglycerin) .[name]... Take 1 tablet under tongue as needed for chest pain.    Ticlopidine Hcl 250 Mg Tabs (Ticlopidine hcl) .[name]... Take one tablet two times a day    Amlodipine Besylate 5 Mg Tabs (Amlodipine besylate) ..... One by mouth once daily  Orders: EKG w/ Interpretation (93000)  Problem # 3:  PULMONARY NODULE (ICD-518.89) Will need  to be be  followed up  Problem # 4:  HYPERLIPIDEMIA (ICD-272.4) continue statin. Her updated  medication list for this problem includes:    Pravachol 20 Mg Tabs (Pravastatin sodium) ..... One by mouth daily

## 2010-07-26 NOTE — Progress Notes (Signed)
Summary: want cxr results  Phone Note Call from Patient Call back at Home Phone [phone]   Caller: Patient Call For: Jeneva Schweizer Reason for Call: Lab or Test Results Summary of Call: patient had a cxr the other day , nad hasnt heard anything from the doctor yet. she wants her results Initial call taken by: Valinda Hoar,  July 16, 2009 12:23 PM  Follow-up for Phone Call        Please advise cxr results from 1/14 thanks Vernie Murders  July 16, 2009 1:19 PM

## 2010-07-26 NOTE — Letter (Signed)
Summary: Cardiac Catheterization Instructions- [facility]  [facility], Main Office  [address]   [place], [place] [address]   Phone: [phone]  Fax: [phone]     11/20/2008 MRN: [id number]  [name] [address] [place], [place]  [address]  Dear [name],   You are scheduled for a Cardiac Catheterization on Monday June 7th with Dr.Bensimhon  Please arrive to the 1st floor of the [facility] and [facility] Center at [facility] at 7:30am on the day of your procedure. Please do not arrive before 6:30 a.m. Call the Heart and Vascular Center at [phone] if you are unable to make your appointmnet. The Code to get into the parking garage under the building is 0700. Take the elevators to the 1st floor. You must have someone to drive you home. Someone must be with you for the first 24 hours after you arrive home. Please wear clothes that are easy to get on and off and wear slip-on shoes. Do not eat or drink after midnight except water with your medications that morning. Bring all your medications and current insurance cards with you.  _X__ DO NOT take these medications before your procedure:        Lantus Insulin  ___ Make sure you take your aspirin.  ___ You may take ALL of your medications with water that morning. ________________________________________________________________________________________________________________________________  ___ DO NOT take ANY medications before your procedure.  ___ Pre-med instructions:  ________________________________________________________________________________________________________________________________  The usual length of stay after your procedure is 2 to 3 hours. This can vary.  If you have any questions, please call the office at the number listed above.   Meredith Staggers, RN

## 2010-07-26 NOTE — Progress Notes (Signed)
Summary: reaction??  Phone Note Call from Patient Call back at Home Phone [phone]   Caller: Patient Call For: sood Reason for Call: Talk to Nurse Summary of Call: muscles hurting really bad, especially in her back, urinating alot, this has started since she started taking prednisone.  Breathing is better though.  Pt says she is really sensitive to meds, down to 1 1/2 a day of 10mg . Initial call taken by: Eugene Gavia,  August 10, 2009 11:47 AM  Follow-up for Phone Call        Pt reports starting Prednisone 2 weeks ago.  Has done fine until 08-07-09 and started having pain in upper and lower back with frequent urination.  Pt denies burning on urination and reports that she cannot see the color of her urine.  She is currently taking Pred 10mg  1 1/2 tab once daily .  Please advise. Abigail Miyamoto RN  August 10, 2009 12:11 PM   Additional Follow-up for Phone Call Additional follow up Details #1::        She was doing well with prednisone until this past Saturday.  She then developed muscle aches and soreness in her back.  She has been urinating more frequently as well.  She only checks her blood sugar in the mornings, and this has been okay.  Advised her to check her blood sugars at different times of the day, as she may have episodes of hyperglycemia related to use of prednisone.  She will monitor her symptoms, and if persistent she may need repeat BMET to check electrolytes and renal function.  Advised to call to schedule lab work if her symptoms persist. Additional Follow-up by: Coralyn Helling MD,  August 10, 2009 12:36 PM

## 2010-07-26 NOTE — Assessment & Plan Note (Signed)
Summary: rov 6-8 wks ///kp   Copy to:    Primary Provider/Referring Provider:  Georgianne Fick, MD   CC:  The patient is here for follow-up and c/o increased sob off Prednisone...dry cough...fatigue.[name]  History of Present Illness: I saw Ms. [name] for evaluation of her abnormal CT chest in the setting of COPD/emphysema, Scleroderma, possible pulmonary hypertension.  She has been off prednisone for the past 3 days.  Since decrease the dose she has been feeling worse again.  She has no energy, and worsening shortness of breath.  She denies fever.  She has a dry cough.  She denies hemoptysis or chest pain.         Current Medications (verified): 1)  Nasonex 50 Mcg/act  Susp (Mometasone Furoate) .... 2 Sprays Each Nostril Once Daily 2)  Imipramine Hcl 25 Mg  Tabs (Imipramine Hcl) .... 3 Tabs At Bedtimey 3)  Synthroid 125 Mcg Tabs (Levothyroxine Sodium) .... One By Mouth Daily 4)  Lantus 100 Unit/ml Soln (Insulin Glargine) .... 8-12 Units Once Daily 5)  Nitro-Dur 0.4 Mg/hr  Pt24 (Nitroglycerin) .... Take 1 Tablet Under Tongue As Needed For Chest Pain. 6)  Pravachol 20 Mg Tabs (Pravastatin Sodium) .... One By Mouth Daily 7)  Ticlopidine Hcl 250 Mg  Tabs (Ticlopidine Hcl) .... Take One Tablet Two Times A Day 8)  Amlodipine Besylate 10 Mg Tabs (Amlodipine Besylate) .... Once Daily 9)  Aspirin 325 Mg  Tabs (Aspirin) .... One Tablet By Mouth Two Times A Day 10)  Nexium 40 Mg Cpdr (Esomeprazole Magnesium) .... One By Mouth Daily 11)  Ocuvite Extra   Tabs (Multiple Vitamins-Minerals) .... Two Times A Day 12)  Mucinex 600 Mg  Tb12 (Guaifenesin) .... Take 1 Tablet By Mouth Once A Day As Needed 13)  Senokot 8.6 Mg Tabs (Sennosides) .... One Tablet By Mouth Once Daily 14)  Halcyon .... As Needed 15)  Dulcolax Stool Softener 100 Mg Caps (Docusate Sodium) .... One Tablet By Mouth Once Daily 16)  Kristalose 10 Gm  Pack (Lactulose) .[name].. 10 Gm Q Hs  Allergies (verified): 1)  ! Lopressor Hct  (Metoprolol-Hydrochlorothiazide) 2)  ! Pcn 3)  ! Sulfa  Past History:  Past Medical History: Last updated: 06/22/2009 Coronary artery disease Dressler's Syndrome Hypertension Dyslipidemia Diabetes mellitus CVA 1991 Macular degeneration, legally blind Depression GERD Diverticulosis Irritable bowel syndrome Zenker's diverticulum Pneumonia as a child Tobacco abuse      - Quit 1999, smoked 1 pack per day for 40 years Emphysema      - PFT 06/16/09 FEV1 1.99(118%), FVC 2.73(111%), FEV1% 73, TLC 4.60(105%), DLCO 66%, no BD Pulmonary hypertension      - 07/25/03 right heart cath PA 35/18, PAOP 16      - 02/18/08 Echo PAS 30 mm Hg Chronic sinusitis Fibromyalgia Hypothyroidism Scleroderma followed by Dr. Dareen Piano Raynauds phenomenon Cervical disk disease Adenomatous Colon Polyps  Past Surgical History: Last updated: 03/09/2009 Total abdominal hysterectomy 1973 Bilateral oophorectomy 2002 Spinal fusion and implantation of a plate in 0623 Removal of left lobe thyroid 1991 Cholecystectomy 1965 C3-C4 fusion 10/15/02 Dr. Trey Sailors Appendectomy Cataract Bronchoscopy 02/22/09 Dr. Coralyn Helling  Vital Signs:  Patient profile:   77 year old female Height:      62 inches (157.48 cm) Weight:      144 pounds (65.45 kg) BMI:     26.43 O2 Sat:      97 % on Room air Temp:     98.7 degrees F (37.06 degrees C) oral Pulse rate:  89 / minute BP sitting:   132 / 70  (right arm) Cuff size:   regular  Vitals Entered By: Michel Bickers CMA (September 14, 2009 2:32 PM)  O2 Sat at Rest %:  97 O2 Flow:  Room air  Physical Exam  Nose:  no deformity, discharge, inflammation, or lesions Mouth:  no oral lesion, no thrush Neck:  no JVP Lungs:  coarse breath sounds, but no wheezing or rales Heart:  regular rhythm, normal rate, and no murmurs.   Extremities:  tightening of skin over fingers Cervical Nodes:  no significant adenopathy   Impression & Recommendations:  Problem # 1:  ABNORMAL  CHEST XRAY (ICD-793.1) She had radiographic improvement in her nodular infiltrates with prednisone therapy.  She has noticed a clinical decline with prednisone cessation.  Will restart prednisone at 10 mg once daily with a slow taper.  Will repeat her chest xray and lab tests.  Depending on these results will decide if repeat CT chest is needed to further assess.  Problem # 2:  SYSTEMIC SCLEROSIS (ICD-710.1) She is being followed by rheumatology.  Problem # 3:  EMPHYSEMA (ICD-492.8) She has not benefitted from prior inhaler therapy.  Will monitor.  Medications Added to Medication List This Visit: 1)  Prednisone 5 Mg Tabs (Prednisone) .... Two pills once daily for 2 weeks, then one pill once daily  Complete Medication List: 1)  Nasonex 50 Mcg/act Susp (Mometasone furoate) .... 2 sprays each nostril once daily 2)  Imipramine Hcl 25 Mg Tabs (Imipramine hcl) .... 3 tabs at bedtimey 3)  Synthroid 125 Mcg Tabs (Levothyroxine sodium) .... One by mouth daily 4)  Lantus 100 Unit/ml Soln (Insulin glargine) .... 8-12 units once daily 5)  Nitro-dur 0.4 Mg/hr Pt24 (Nitroglycerin) .... Take 1 tablet under tongue as needed for chest pain. 6)  Pravachol 20 Mg Tabs (Pravastatin sodium) .... One by mouth daily 7)  Ticlopidine Hcl 250 Mg Tabs (Ticlopidine hcl) .... Take one tablet two times a day 8)  Amlodipine Besylate 10 Mg Tabs (Amlodipine besylate) .... Once daily 9)  Aspirin 325 Mg Tabs (Aspirin) .... One tablet by mouth two times a day 10)  Nexium 40 Mg Cpdr (Esomeprazole magnesium) .... One by mouth daily 11)  Ocuvite Extra Tabs (Multiple vitamins-minerals) .... Two times a day 12)  Mucinex 600 Mg Tb12 (Guaifenesin) .... Take 1 tablet by mouth once a day as needed 13)  Senokot 8.6 Mg Tabs (Sennosides) .... One tablet by mouth once daily 14)  Halcyon  .... As needed 15)  Dulcolax Stool Softener 100 Mg Caps (Docusate sodium) .... One tablet by mouth once daily 16)  Kristalose 10 Gm Pack (Lactulose)  .[name].. 10 gm q hs 17)  Prednisone 5 Mg Tabs (Prednisone) .... Two pills once daily for 2 weeks, then one pill once daily  Other Orders: Est. Patient Level III (99213) T-2 View CXR (71020TC) T-2 View CXR (71020TC) TLB-BMP (Basic Metabolic Panel-BMET) (80048-METABOL) TLB-CBC Platelet - w/Differential (85025-CBCD) TLB-Hepatic/Liver Function Pnl (80076-HEPATIC) TLB-Sedimentation Rate (ESR) (85652-ESR)  Patient Instructions: 1)  Prednisone 5 mg pills: 2 pills once daily for 2 weeks, then one pill once daily until next visit 2)  Chest xray today 3)  Blood test today 4)  Follow up in 4 weeks Prescriptions: PREDNISONE 5 MG TABS (PREDNISONE) two pills once daily for 2 weeks, then one pill once daily  #45 x 1   Entered and Authorized by:   Coralyn Helling MD   Signed by:   Coralyn Helling MD  on 09/14/2009   Method used:   Electronically to        [facility]* (retail)       [address]       [place], [place]  [id number]       Ph: [phone]       Fax: [phone]   RxID:   [id number]

## 2010-07-26 NOTE — Assessment & Plan Note (Signed)
Summary: Constipation/dfs   History of Present Illness Visit Type: Follow-up Visit Primary GI MD: Sheryn Bison MD FACP FAGA Primary Provider: Georgianne Fick, MD  Requesting Provider: n/a Chief Complaint: constipation History of Present Illness:   Elderly female with chronic diverticulosis and chronic sigmoid stenosis who has recently had thorough GI evaluation including CT colonoscopy, regular colonoscopy, and multiple labs. She continues with a mild constipation but denies abdominal pain, rectal bleeding, or systemic complaints. She could not tolerate Kristalose because of associated gas and bloating. She is on chronic Senokot and Dulcolax.   GI Review of Systems      Denies abdominal pain, acid reflux, belching, bloating, chest pain, dysphagia with liquids, dysphagia with solids, heartburn, loss of appetite, nausea, vomiting, vomiting blood, weight loss, and  weight gain.      Reports constipation.     Denies anal fissure, black tarry stools, change in bowel habit, diarrhea, diverticulosis, fecal incontinence, heme positive stool, hemorrhoids, irritable bowel syndrome, jaundice, light color stool, liver problems, rectal bleeding, and  rectal pain.    Current Medications (verified): 1)  Imipramine Hcl 25 Mg  Tabs (Imipramine Hcl) .... 3 Tabs At Bedtimey 2)  Synthroid 125 Mcg Tabs (Levothyroxine Sodium) .... One By Mouth Daily 3)  Lantus 100 Unit/ml Soln (Insulin Glargine) .... 8-12 Units Once Daily 4)  Nitro-Dur 0.4 Mg/hr  Pt24 (Nitroglycerin) .... Take 1 Tablet Under Tongue As Needed For Chest Pain. 5)  Ticlopidine Hcl 250 Mg  Tabs (Ticlopidine Hcl) .... Take One Tablet Two Times A Day 6)  Amlodipine Besylate 10 Mg Tabs (Amlodipine Besylate) .... Once Daily 7)  Aspirin 325 Mg  Tabs (Aspirin) .... One Tablet By Mouth Two Times A Day 8)  Nexium 40 Mg Cpdr (Esomeprazole Magnesium) .... One By Mouth Daily 9)  Ocuvite Extra   Tabs (Multiple Vitamins-Minerals) .... Two Times A  Day 10)  Mucinex 600 Mg  Tb12 (Guaifenesin) .... Take 1 Tablet By Mouth Once A Day As Needed 11)  Senokot 8.6 Mg Tabs (Sennosides) .... One Tablet By Mouth Once Daily 12)  Halcyon .... As Needed 13)  Dulcolax Stool Softener 100 Mg Caps (Docusate Sodium) .... One Tablet By Mouth Once Daily 14)  Kristalose 10 Gm  Pack (Lactulose) .[name].. 10 Gm Q Hs  Allergies (verified): 1)  ! Lopressor Hct (Metoprolol-Hydrochlorothiazide) 2)  ! Pcn 3)  ! Sulfa  Past History:  Past medical, surgical, family and social histories (including risk factors) reviewed for relevance to current acute and chronic problems.  Past Medical History: Reviewed history from 10/25/2009 and no changes required. Coronary artery disease Dressler's Syndrome Hypertension Dyslipidemia Diabetes mellitus CVA 1991 Macular degeneration, legally blind Depression GERD Diverticulosis Irritable bowel syndrome Zenker's diverticulum Pneumonia as a child Tobacco abuse      - Quit 1999, smoked 1 pack per day for 40 years Emphysema      - PFT 06/16/09 FEV1 1.99(118%), FVC 2.73(111%), FEV1% 73, TLC 4.60(105%), DLCO 66%, no BD Pulmonary hypertension      - 07/25/03 right heart cath PA 35/18, PAOP 16      - 02/18/08 Echo PAS 30 mm Hg Chronic sinusitis Fibromyalgia Hypothyroidism Scleroderma followed by Dr. Dareen Piano Sjogren's Disease Raynauds phenomenon Cervical disk disease Adenomatous Colon Polyps  Past Surgical History: Total abdominal hysterectomy 1973 Bilateral oophorectomy 2002 Spinal fusion and implantation of a plate in 8469 Removal of left lobe thyroid 1991 Cholecystectomy 1965 C3-C4 fusion 10/15/02 Dr. Trey Sailors Appendectomy Cataract Bronchoscopy 02/22/09 Dr. Coralyn Helling Stent Surgery x 3  Family History: Reviewed history from 06/29/2009 and no changes required. mother died at age 94 from sepsis father died at age 35 from stroke No FH of Colon Cancer:  Social History: Reviewed history from 09/03/2009  and no changes required. Retired  Divorced Quit tobacco 1999 (40 pack years) No ETOH  Review of Systems       The patient complains of arthritis/joint pain, back pain, change in vision, shortness of breath, and vision changes.  The patient denies allergy/sinus, anemia, anxiety-new, blood in urine, breast changes/lumps, confusion, cough, coughing up blood, depression-new, fainting, fatigue, fever, headaches-new, hearing problems, heart murmur, heart rhythm changes, itching, menstrual pain, muscle pains/cramps, night sweats, nosebleeds, pregnancy symptoms, skin rash, sleeping problems, sore throat, swelling of feet/legs, swollen lymph glands, thirst - excessive , urination - excessive , urination changes/pain, urine leakage, and voice change.    Vital Signs:  Patient profile:   77 year old female Height:      62 inches Weight:      140 pounds BMI:     25.70 BSA:     1.64 Pulse rate:   88 / minute Pulse rhythm:   regular BP sitting:   120 / 60  (left arm) Cuff size:   regular  Vitals Entered By: Ok Anis CMA (March 15, 2010 10:25 AM)  Physical Exam  General:  Well developed, well nourished, no acute distress.healthy appearing.   Head:  Normocephalic and atraumatic. Eyes:  exam deferred to patient's ophthalmologist.   Abdomen:  Soft, nontender and nondistended. No masses, hepatosplenomegaly or hernias noted. Normal bowel sounds.No significant distention, masses or tenderness. Bowel sounds are normal. Neurologic:  Alert and  oriented x4;  grossly normal neurologically. Psych:  Alert and cooperative. Normal mood and affect.   Impression & Recommendations:  Problem # 1:  DIVERTICULAR DISEASE (ICD-562.10) Assessment Unchanged Chronic constipation associated with her diverticulosis and mild sigmoid stenosis from previous diverticulitis. However, colonoscopy was accomplished. CT colonoscopy suggested a colon mass which was found to be a lipoma on colonoscopy. This patient has  chronic laxative dependency, and I've urged her to continue all of her laxatives and he used MiraLax 8 ounces on a regular basis at bedtime with adjustment per her bowel pattern. She does not seem to be a good candidate for sigmoid resection because of her cardiovascular disease and diabetes.  Problem # 2:  CORONARY ARTERY DISEASE (ICD-414.00) Assessment: Improved Recent hospitalization because of severe nausea showed stable coronary artery disease. She is on multiple medications listed and reviewed in her chart which she is to continue as per primary care and cardiology.  Patient Instructions: 1)  Copy sent to : Georgianne Fick, MD  2)  Stop Baldemar Friday. 3)  Mix one capfull of Miralax in 8 ounce of water and drink every night.  4)  Please continue current medications.  5)  Please schedule a follow-up appointment as needed.  6)  The medication list was reviewed and reconciled.  All changed / newly prescribed medications were explained.  A complete medication list was provided to the patient / caregiver.

## 2010-07-26 NOTE — Letter (Signed)
Summary: [facility]  [facility]   Imported By: Kassie Mends 02/12/2009 10:14:06  _____________________________________________________________________  External Attachment:    Type:   Image     Comment:   External Document

## 2010-07-26 NOTE — Progress Notes (Signed)
Summary: request to speak to nurse  Phone Note Call from Patient Call back at Home Phone [phone]   Caller: Patient Reason for Call: Talk to Nurse Summary of Call: request to speak to nurse about rehab Initial call taken by: Migdalia Dk,  Nov 23, 2009 2:10 PM  Follow-up for Phone Call        Advised patient that paper work was faxed to Cardiac Rehab about 2 weeks ago. She will call and make an appointment. Follow-up by: Suzan Garibaldi RN

## 2010-07-28 NOTE — Assessment & Plan Note (Signed)
Summary: 6 wk f/u/LC   Visit Type:  Follow-up Copy to:  Dietrich Pates, Sheryn Bison, Dr. Dareen Piano Primary Provider/Referring Provider:  Georgianne Fick, MD   CC:  6 week follow-up...pt says dry cough is slightly better...on prednisone 5 mg daily.  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  She has more energy since being on prednisone.  She still has occasional cough, but this has improved.  She is not having feverish feeling any more.  She denies chest pain.     Current Medications (verified): 1)  Imipramine Hcl 25 Mg  Tabs (Imipramine Hcl) .... 3 Tabs At Bedtimey 2)  Synthroid 125 Mcg Tabs (Levothyroxine Sodium) .... One By Mouth Daily 3)  Lantus 100 Unit/ml Soln (Insulin Glargine) .... 8-12 Units Once Daily 4)  Nitro-Dur 0.4 Mg/hr  Pt24 (Nitroglycerin) .... Take 1 Tablet Under Tongue As Needed For Chest Pain. 5)  Ticlopidine Hcl 250 Mg  Tabs (Ticlopidine Hcl) .... Take One Tablet Two Times A Day 6)  Amlodipine Besylate 10 Mg Tabs (Amlodipine Besylate) .... Once Daily 7)  Aspirin 325 Mg  Tabs (Aspirin) .... One Tablet By Mouth Two Times A Day 8)  Nexium 40 Mg Cpdr (Esomeprazole Magnesium) .... One By Mouth Daily 9)  Ocuvite Extra   Tabs (Multiple Vitamins-Minerals) .... Two Times A Day 10)  Mucinex 600 Mg  Tb12 (Guaifenesin) .... Take 1 Tablet By Mouth Once A Day As Needed 11)  Senokot 8.6 Mg Tabs (Sennosides) .... One Tablet By Mouth Once Daily 12)  Halcion 0.25 Mg Tabs (Triazolam) .... As Needed 13)  Dulcolax Stool Softener 100 Mg Caps (Docusate Sodium) .... One Tablet By Mouth Once Daily 14)  Miralax  Powd (Polyethylene Glycol 3350) .... Mix One Capfull in 8 Ounce of Water and Drink Every Night 15)  Nasonex 50 Mcg/act Susp (Mometasone Furoate) .... As Needed 16)  Prednisone 5 Mg Tabs (Prednisone) .[name].. 1 By Mouth Daily  Allergies (verified): 1)  ! Lopressor Hct  (Metoprolol-Hydrochlorothiazide) 2)  ! Pcn 3)  ! Sulfa  Past History:  Past Medical History: Reviewed history from 05/09/2010 and no changes required. Coronary artery disease Dressler's Syndrome Hypertension Dyslipidemia Diabetes mellitus CVA 1991 Macular degeneration, legally blind Depression GERD Diverticulosis Irritable bowel syndrome Zenker's diverticulum Pneumonia as a child Tobacco abuse      - Quit 1999, smoked 1 pack per day for 40 years Emphysema      - PFT 06/16/09 FEV1 1.99(118%), FEV1% 73, TLC 4.60(105%), DLCO 66%, no BD Pulmonary hypertension      - 07/25/03 right heart cath PA 35/18, PAOP 16      - 02/18/08 Echo PAS 30 mm Hg Chronic sinusitis Fibromyalgia Hypothyroidism Scleroderma followed by Dr. Dareen Piano Sjogren's Disease Raynauds phenomenon Cervical disk disease Adenomatous Colon Polyps  Past Surgical History: Reviewed history from 03/15/2010 and no changes required. Total abdominal hysterectomy 1973 Bilateral oophorectomy 2002 Spinal fusion and implantation of a plate in 0981 Removal of left lobe thyroid 1991 Cholecystectomy 1965 C3-C4 fusion 10/15/02 Dr. Trey Sailors Appendectomy Cataract Bronchoscopy 02/22/09 Dr. Coralyn Helling Stent Surgery x 3   Family History: Reviewed history from 06/29/2009 and no changes required. mother died at age 76 from sepsis father died at age 43 from stroke No FH of Colon Cancer:  Social History: Reviewed history from 09/03/2009 and no changes required. Retired  Divorced Quit tobacco 1999 (40 pack years) No ETOH  Vital Signs:  Patient profile:  77 year old female Height:      62 inches (157.48 cm) Weight:      143.50 pounds (65.23 kg) BMI:     26.34 O2 Sat:      98 % on Room air Temp:     98.4 degrees F (36.89 degrees C) oral Pulse rate:   88 / minute BP sitting:   128 / 68  (left arm) Cuff size:   regular  Vitals Entered By: Michel Bickers CMA (June 23, 2010 2:30 PM)  O2 Sat at Rest %:  98 O2  Flow:  Room air CC: 6 week follow-up...pt says dry cough is slightly better...on prednisone 5 mg daily Comments Medications reviewed with patient Michel Bickers [facility]  June 23, 2010 2:40 PM   Physical Exam  General:  normal appearance and healthy appearing.   Nose:  no deformity, discharge, inflammation, or lesions Mouth:  no oral exudate, no thrush, stable lesion on tongue Neck:  no JVP Lungs:  coarse breath sounds, but no wheezing or rales Heart:  regular rhythm, normal rate, and no murmurs.   Extremities:  tightening of skin over fingers Neurologic:  strength normal.   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  She again has symptomatic improvement with course of prednisone.  Will repeat her chest xray to see if her previous infiltrate has resolved as well.  Depending on this will determine how quickly she can reduce her prednisone dose.  Problem # 2:  SYSTEMIC SCLEROSIS (ICD-710.1) She is followed by Dr. Dareen Piano with rheumatology.  Medications Added to Medication List This Visit: 1)  Prednisone 5 Mg Tabs (Prednisone) .[name].. 1 by mouth daily  Complete Medication List: 1)  Imipramine Hcl 25 Mg Tabs (Imipramine hcl) .... 3 tabs at bedtimey 2)  Synthroid 125 Mcg Tabs (Levothyroxine sodium) .... One by mouth daily 3)  Lantus 100 Unit/ml Soln (Insulin glargine) .... 8-12 units once daily 4)  Nitro-dur 0.4 Mg/hr Pt24 (Nitroglycerin) .... Take 1 tablet under tongue as needed for chest pain. 5)  Ticlopidine Hcl 250 Mg Tabs (Ticlopidine hcl) .... Take one tablet two times a day 6)  Amlodipine Besylate 10 Mg Tabs (Amlodipine besylate) .... Once daily 7)  Aspirin 325 Mg Tabs (Aspirin) .... One tablet by mouth two times a day 8)   Nexium 40 Mg Cpdr (Esomeprazole magnesium) .... One by mouth daily 9)  Ocuvite Extra Tabs (Multiple vitamins-minerals) .... Two times a day 10)  Mucinex 600 Mg Tb12 (Guaifenesin) .... Take 1 tablet by mouth once a day as needed 11)  Senokot 8.6 Mg Tabs (Sennosides) .... One tablet by mouth once daily 12)  Halcion 0.25 Mg Tabs (Triazolam) .... As needed 13)  Dulcolax Stool Softener 100 Mg Caps (Docusate sodium) .... One tablet by mouth once daily 14)  Miralax Powd (Polyethylene glycol 3350) .... Mix one capfull in 8 ounce of water and drink every night 15)  Nasonex 50 Mcg/act Susp (Mometasone furoate) .... As needed 16)  Prednisone 5 Mg Tabs (Prednisone) .[name].. 1 by mouth daily  Other Orders: Est. Patient Level III (13086) T-2 View CXR (71020TC)  Patient Instructions: 1)  Chest xray today 2)  Continue prednisone 5 mg once daily for now 3)  Follow up in 2 months

## 2010-08-19 ENCOUNTER — Ambulatory Visit (INDEPENDENT_AMBULATORY_CARE_PROVIDER_SITE_OTHER): Payer: Medicare Other | Admitting: Pulmonary Disease

## 2010-08-19 ENCOUNTER — Encounter: Payer: Self-pay | Admitting: Pulmonary Disease

## 2010-08-19 DIAGNOSIS — J438 Other emphysema: Secondary | ICD-10-CM

## 2010-08-19 DIAGNOSIS — R0602 Shortness of breath: Secondary | ICD-10-CM

## 2010-08-19 DIAGNOSIS — M349 Systemic sclerosis, unspecified: Secondary | ICD-10-CM

## 2010-08-23 NOTE — Assessment & Plan Note (Addendum)
Summary: 2 month rov//sh   Copy to:  Dietrich Pates, Sheryn Bison, Dr. Dareen Piano Primary Provider/Referring Provider:  Georgianne Fick, MD   CC:  2 month follow up. Breathing is much better. Pt still has dry cough. Pt states she has been keeping a sinus infection. 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  She is being treated for a sinus infection with ceftin.  Her breathing has been doing well otherwise.  She denies chest pain, cough, sputum, or fever.  She is using prednisone 2.5 mg every other day.  She still has some fatigue, but this is better since being on prednisone.  She recently lost her son, and it has been difficult to cope with this.     Current Medications (verified): 1)  Imipramine Hcl 25 Mg  Tabs (Imipramine Hcl) .... 3 Tabs At Bedtimey 2)  Synthroid 125 Mcg Tabs (Levothyroxine Sodium) .... One By Mouth Daily 3)  Lantus 100 Unit/ml Soln (Insulin Glargine) .... 8-12 Units Once Daily 4)  Nitro-Dur 0.4 Mg/hr  Pt24 (Nitroglycerin) .... Take 1 Tablet Under Tongue As Needed For Chest Pain. 5)  Ticlopidine Hcl 250 Mg  Tabs (Ticlopidine Hcl) .... Take One Tablet Two Times A Day 6)  Amlodipine Besylate 10 Mg Tabs (Amlodipine Besylate) .... Once Daily 7)  Aspirin 325 Mg  Tabs (Aspirin) .... One Tablet By Mouth Two Times A Day 8)  Nexium 40 Mg Cpdr (Esomeprazole Magnesium) .... One By Mouth Daily 9)  Ocuvite Extra   Tabs (Multiple Vitamins-Minerals) .... Once Daily 10)  Mucinex 600 Mg  Tb12 (Guaifenesin) .... Take 1 Tablet By Mouth Once A Day As Needed 11)  Senokot 8.6 Mg Tabs (Sennosides) .... One Tablet By Mouth Once Daily As Needed 12)  Halcion 0.25 Mg Tabs (Triazolam) .... As Needed 13)  Dulcolax Stool Softener 100 Mg Caps (Docusate Sodium) .... One Tablet By Mouth Once Daily As Needed 14)  Nasonex 50 Mcg/act Susp (Mometasone Furoate) .... As Needed 15)  Prednisone 1 Mg  Tabs (Prednisone) .... 1/2 Tablet Once Daily  Allergies (verified): 1)  ! Lopressor Hct (Metoprolol-Hydrochlorothiazide) 2)  ! Pcn 3)  ! Sulfa  Past History:  Past Medical History: Reviewed history from 05/09/2010 and no changes required. Coronary artery disease Dressler's Syndrome Hypertension Dyslipidemia Diabetes mellitus CVA 1991 Macular degeneration, legally blind Depression GERD Diverticulosis Irritable bowel syndrome Zenker's diverticulum Pneumonia as a child Tobacco abuse      - Quit 1999, smoked 1 pack per day for 40 years Emphysema      - PFT 06/16/09 FEV1 1.99(118%), FEV1% 73, TLC 4.60(105%), DLCO 66%, no BD Pulmonary hypertension      - 07/25/03 right heart cath PA 35/18, PAOP 16      - 02/18/08 Echo PAS 30 mm Hg Chronic sinusitis Fibromyalgia Hypothyroidism Scleroderma followed by Dr. Dareen Piano Sjogren's Disease Raynauds phenomenon Cervical disk disease Adenomatous Colon Polyps  Past Surgical History: Reviewed history from 03/15/2010 and no changes required. Total abdominal hysterectomy 1973 Bilateral oophorectomy 2002 Spinal fusion and implantation of a plate in 7829 Removal of left lobe thyroid 1991 Cholecystectomy 1965 C3-C4 fusion 10/15/02 Dr. Trey Sailors Appendectomy Cataract Bronchoscopy 02/22/09 Dr. Coralyn Helling Stent Surgery x 3   Vital Signs:  Patient profile:   77 year old female Height:      62 inches Weight:      142.25 pounds BMI:     26.11 O2 Sat:  96 % on Room air Temp:     98.7 degrees F oral Pulse rate:   83 / minute BP sitting:   120 / 62  (left arm) Cuff size:   regular  Vitals Entered By: Carver Fila (August 19, 2010 1:35 PM)  O2 Flow:  Room air CC: 2 month follow up. Breathing is much better. Pt still has dry cough. Pt states she has been keeping a sinus infection.  Comments meds and allergies updated Phone number updated Carver Fila  August 19, 2010 1:35 PM    Physical Exam  General:  normal appearance  and healthy appearing.   Nose:  no deformity, discharge, inflammation, or lesions Mouth:  no oral exudate, no thrush, stable lesion on tongue Neck:  no JVP Lungs:  coarse breath sounds, but no wheezing or rales Heart:  regular rhythm, normal rate, and no murmurs.   Extremities:  tightening of skin over fingers Neurologic:  strength normal.   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  She again has symptomatic improvement with course of prednisone.  Will continue to slowly wean off prednisone as tolerated.  Will change to 1 mg every other day.  Will decide about repeat chest xray at next visit.  Problem # 2:  SYSTEMIC SCLEROSIS (ICD-710.1)  She is followed by Dr. Dareen Piano with rheumatology.  Medications Added to Medication List This Visit: 1)  Ocuvite Extra Tabs (Multiple vitamins-minerals) .... Once daily 2)  Senokot 8.6 Mg Tabs (Sennosides) .... One tablet by mouth once daily as needed 3)  Dulcolax Stool Softener 100 Mg Caps (Docusate sodium) .... One tablet by mouth once daily as needed 4)  Prednisone 1 Mg Tabs (Prednisone) .... 1/2 tablet once daily  Complete Medication List: 1)  Imipramine Hcl 25 Mg Tabs (Imipramine hcl) .... 3 tabs at bedtimey 2)  Synthroid 125 Mcg Tabs (Levothyroxine sodium) .... One by mouth daily 3)  Lantus 100 Unit/ml Soln (Insulin glargine) .... 8-12 units once daily 4)  Nitro-dur 0.4 Mg/hr Pt24 (Nitroglycerin) .... Take 1 tablet under tongue as needed for chest pain. 5)  Ticlopidine Hcl 250 Mg Tabs (Ticlopidine hcl) .... Take one tablet two times a day 6)  Amlodipine Besylate 10 Mg Tabs (Amlodipine besylate) .... Once daily 7)  Aspirin 325 Mg Tabs (Aspirin) .... One tablet by mouth two times a day 8)  Nexium 40 Mg Cpdr (Esomeprazole magnesium) .... One by mouth daily 9)  Ocuvite Extra  Tabs (Multiple vitamins-minerals) .... Once daily 10)  Mucinex 600 Mg Tb12 (Guaifenesin) .... Take 1 tablet by mouth once a day as needed 11)  Senokot 8.6 Mg Tabs (Sennosides) .... One tablet by mouth once daily as needed 12)  Halcion 0.25 Mg Tabs (Triazolam) .... As needed 13)  Dulcolax Stool Softener 100 Mg Caps (Docusate sodium) .... One tablet by mouth once daily as needed 14)  Nasonex 50 Mcg/act Susp (Mometasone furoate) .... As needed 15)  Prednisone 1 Mg Tabs (Prednisone) .... 1/2 tablet once daily  Patient Instructions: 1)  Change prednisone to 1 mg every other day  2)  Follow up in 2 months Prescriptions: PREDNISONE 1 MG TABS (PREDNISONE) 1/2 tablet once daily  #30 x 2   Entered and Authorized by:   Coralyn Helling MD   Signed by:   Coralyn Helling MD on 08/19/2010   Method used:   Electronically  to        [facility]* (retail)       [address]       [place], [place]  [id number]       Ph: [phone]       Fax: [phone]   RxID:   [id number]     Appended Document: 2 month rov//sh    Clinical Lists Changes  Orders: Added new Service order of Est. Patient Level III (27253) - Signed

## 2010-09-08 LAB — POCT CARDIAC MARKERS
CKMB, poc: <1 ng/mL — ABNORMAL LOW (ref 1.0–8.0)
CKMB, poc: <1 ng/mL — ABNORMAL LOW (ref 1.0–8.0)
CKMB, poc: <1 ng/mL — ABNORMAL LOW (ref 1.0–8.0)
Myoglobin, poc: 39.5 ng/mL (ref 12–200)
Myoglobin, poc: 47.1 ng/mL (ref 12–200)
Myoglobin, poc: 49 ng/mL (ref 12–200)
Troponin i, poc: <0.05 ng/mL (ref 0.00–0.09)
Troponin i, poc: <0.05 ng/mL (ref 0.00–0.09)
Troponin i, poc: <0.05 ng/mL (ref 0.00–0.09)

## 2010-09-08 LAB — CBC
HCT: 37.7 % (ref 36.0–46.0)
Hemoglobin: 13.1 g/dL (ref 12.0–15.0)
MCH: 29.8 pg (ref 26.0–34.0)
MCHC: 34.7 g/dL (ref 30.0–36.0)
MCV: 85.9 fL (ref 78.0–100.0)
Platelets: 216 10*3/uL (ref 150–400)
RBC: 4.39 MIL/uL (ref 3.87–5.11)
RDW: 12.9 % (ref 11.5–15.5)
WBC: 13 10*3/uL — ABNORMAL HIGH (ref 4.0–10.5)

## 2010-09-08 LAB — URINALYSIS, ROUTINE W REFLEX MICROSCOPIC
Bilirubin Urine: NEGATIVE
Glucose, UA: NEGATIVE mg/dL
Hgb urine dipstick: NEGATIVE
Ketones, ur: NEGATIVE mg/dL
Nitrite: NEGATIVE
Protein, ur: NEGATIVE mg/dL
Specific Gravity, Urine: 1.015 (ref 1.005–1.030)
Urobilinogen, UA: 0.2 mg/dL (ref 0.0–1.0)
pH: 5.5 (ref 5.0–8.0)

## 2010-09-08 LAB — DIFFERENTIAL
Basophils Absolute: 0.1 10*3/uL (ref 0.0–0.1)
Basophils Relative: 1 % (ref 0–1)
Eosinophils Absolute: 0.1 10*3/uL (ref 0.0–0.7)
Eosinophils Relative: 1 % (ref 0–5)
Lymphocytes Relative: 15 % (ref 12–46)
Lymphs Abs: 2 10*3/uL (ref 0.7–4.0)
Monocytes Absolute: 1 10*3/uL (ref 0.1–1.0)
Monocytes Relative: 7 % (ref 3–12)
Neutro Abs: 9.8 10*3/uL — ABNORMAL HIGH (ref 1.7–7.7)
Neutrophils Relative %: 76 % (ref 43–77)

## 2010-09-08 LAB — POCT I-STAT, CHEM 8
BUN: 18 mg/dL (ref 6–23)
Calcium, Ion: 1.11 mmol/L — ABNORMAL LOW (ref 1.12–1.32)
Chloride: 102 mEq/L (ref 96–112)
Creatinine, Ser: 1.1 mg/dL (ref 0.4–1.2)
Glucose, Bld: 108 mg/dL — ABNORMAL HIGH (ref 70–99)
HCT: 41 % (ref 36.0–46.0)
Hemoglobin: 13.9 g/dL (ref 12.0–15.0)
Potassium: 4 mEq/L (ref 3.5–5.1)
Sodium: 135 mEq/L (ref 135–145)
TCO2: 25 mmol/L (ref 0–100)

## 2010-09-10 LAB — BASIC METABOLIC PANEL
BUN: 11 mg/dL (ref 6–23)
BUN: 9 mg/dL (ref 6–23)
CO2: 29 mEq/L (ref 19–32)
CO2: 29 mEq/L (ref 19–32)
Calcium: 8.2 mg/dL — ABNORMAL LOW (ref 8.4–10.5)
Calcium: 8.5 mg/dL (ref 8.4–10.5)
Chloride: 98 mEq/L (ref 96–112)
Chloride: 99 mEq/L (ref 96–112)
Creatinine, Ser: 0.79 mg/dL (ref 0.4–1.2)
Creatinine, Ser: 0.97 mg/dL (ref 0.4–1.2)
GFR calc Af Amer: >60 mL/min (ref >60)
GFR calc Af Amer: >60 mL/min (ref >60)
GFR calc non Af Amer: 56 mL/min — ABNORMAL LOW (ref >60)
GFR calc non Af Amer: >60 mL/min (ref >60)
Glucose, Bld: 125 mg/dL — ABNORMAL HIGH (ref 70–99)
Glucose, Bld: 179 mg/dL — ABNORMAL HIGH (ref 70–99)
Potassium: 3.6 mEq/L (ref 3.5–5.1)
Potassium: 3.6 mEq/L (ref 3.5–5.1)
Sodium: 135 mEq/L (ref 135–145)
Sodium: 136 mEq/L (ref 135–145)

## 2010-09-10 LAB — CBC
HCT: 34 % — ABNORMAL LOW (ref 36.0–46.0)
HCT: 34.3 % — ABNORMAL LOW (ref 36.0–46.0)
Hemoglobin: 11.5 g/dL — ABNORMAL LOW (ref 12.0–15.0)
Hemoglobin: 11.6 g/dL — ABNORMAL LOW (ref 12.0–15.0)
MCH: 30.3 pg (ref 26.0–34.0)
MCH: 30.4 pg (ref 26.0–34.0)
MCHC: 33.8 g/dL (ref 30.0–36.0)
MCHC: 33.8 g/dL (ref 30.0–36.0)
MCV: 89.6 fL (ref 78.0–100.0)
MCV: 89.8 fL (ref 78.0–100.0)
Platelets: 184 10*3/uL (ref 150–400)
Platelets: 207 10*3/uL (ref 150–400)
RBC: 3.79 MIL/uL — ABNORMAL LOW (ref 3.87–5.11)
RBC: 3.83 MIL/uL — ABNORMAL LOW (ref 3.87–5.11)
RDW: 12.9 % (ref 11.5–15.5)
RDW: 13.5 % (ref 11.5–15.5)
WBC: 5.5 10*3/uL (ref 4.0–10.5)
WBC: 7.6 10*3/uL (ref 4.0–10.5)

## 2010-09-10 LAB — POCT CARDIAC MARKERS
CKMB, poc: <1 ng/mL — ABNORMAL LOW (ref 1.0–8.0)
Myoglobin, poc: 42 ng/mL (ref 12–200)
Troponin i, poc: <0.05 ng/mL (ref 0.00–0.09)

## 2010-09-10 LAB — TROPONIN I: Troponin I: 0.01 ng/mL (ref 0.00–0.06)

## 2010-09-10 LAB — CARDIAC PANEL(CRET KIN+CKTOT+MB+TROPI)
CK, MB: 1.4 ng/mL (ref 0.3–4.0)
CK, MB: 1.5 ng/mL (ref 0.3–4.0)
Relative Index: INVALID (ref 0.0–2.5)
Relative Index: INVALID (ref 0.0–2.5)
Total CK: 46 U/L (ref 7–177)
Total CK: 49 U/L (ref 7–177)
Troponin I: 0.01 ng/mL (ref 0.00–0.06)
Troponin I: 0.02 ng/mL (ref 0.00–0.06)

## 2010-09-10 LAB — HEMOGLOBIN A1C
Hgb A1c MFr Bld: 6.7 % — ABNORMAL HIGH (ref <5.7)
Mean Plasma Glucose: 146 mg/dL — ABNORMAL HIGH (ref <117)

## 2010-09-10 LAB — DIFFERENTIAL
Basophils Absolute: 0.1 10*3/uL (ref 0.0–0.1)
Basophils Relative: 1 % (ref 0–1)
Eosinophils Absolute: 0.2 10*3/uL (ref 0.0–0.7)
Eosinophils Relative: 3 % (ref 0–5)
Lymphocytes Relative: 37 % (ref 12–46)
Lymphs Abs: 2.8 10*3/uL (ref 0.7–4.0)
Monocytes Absolute: 0.5 10*3/uL (ref 0.1–1.0)
Monocytes Relative: 7 % (ref 3–12)
Neutro Abs: 4.1 10*3/uL (ref 1.7–7.7)
Neutrophils Relative %: 54 % (ref 43–77)

## 2010-09-10 LAB — PROTIME-INR
INR: 0.99 (ref 0.00–1.49)
Prothrombin Time: 13 seconds (ref 11.6–15.2)

## 2010-09-10 LAB — HEPARIN LEVEL (UNFRACTIONATED): Heparin Unfractionated: 0.6 IU/mL (ref 0.30–0.70)

## 2010-09-10 LAB — GLUCOSE, CAPILLARY
Glucose-Capillary: 116 mg/dL — ABNORMAL HIGH (ref 70–99)
Glucose-Capillary: 162 mg/dL — ABNORMAL HIGH (ref 70–99)
Glucose-Capillary: 172 mg/dL — ABNORMAL HIGH (ref 70–99)
Glucose-Capillary: 91 mg/dL (ref 70–99)

## 2010-09-10 LAB — CK TOTAL AND CKMB (NOT AT ARMC)
CK, MB: 1.7 ng/mL (ref 0.3–4.0)
Relative Index: INVALID (ref 0.0–2.5)
Total CK: 51 U/L (ref 7–177)

## 2010-09-10 LAB — MRSA PCR SCREENING: MRSA by PCR: NEGATIVE

## 2010-09-10 LAB — APTT: aPTT: 120 seconds — ABNORMAL HIGH (ref 24–37)

## 2010-09-10 LAB — TSH: TSH: 5.892 u[IU]/mL — ABNORMAL HIGH (ref 0.350–4.500)

## 2010-09-11 LAB — GLUCOSE, CAPILLARY
Glucose-Capillary: 121 mg/dL — ABNORMAL HIGH (ref 70–99)
Glucose-Capillary: 139 mg/dL — ABNORMAL HIGH (ref 70–99)

## 2010-09-12 LAB — GLUCOSE, CAPILLARY: Glucose-Capillary: 117 mg/dL — ABNORMAL HIGH (ref 70–99)

## 2010-10-01 LAB — BODY FLUID CELL COUNT WITH DIFFERENTIAL
Eos, Fluid: 3 %
Lymphs, Fluid: 7 %
Monocyte-Macrophage-Serous Fluid: 81 % (ref 50–90)
Neutrophil Count, Fluid: 9 % (ref 0–25)
Total Nucleated Cell Count, Fluid: 18 cu mm (ref 0–1000)

## 2010-10-01 LAB — GLUCOSE, CAPILLARY
Glucose-Capillary: 108 mg/dL — ABNORMAL HIGH (ref 70–99)
Glucose-Capillary: 129 mg/dL — ABNORMAL HIGH (ref 70–99)
Glucose-Capillary: 100 mg/dL — ABNORMAL HIGH (ref 70–99)
Glucose-Capillary: 123 mg/dL — ABNORMAL HIGH (ref 70–99)
Glucose-Capillary: 137 mg/dL — ABNORMAL HIGH (ref 70–99)
Glucose-Capillary: 143 mg/dL — ABNORMAL HIGH (ref 70–99)
Glucose-Capillary: 86 mg/dL (ref 70–99)
Glucose-Capillary: 88 mg/dL (ref 70–99)

## 2010-10-01 LAB — PATHOLOGIST SMEAR REVIEW

## 2010-10-01 LAB — FUNGUS CULTURE W SMEAR: Fungal Smear: NONE SEEN

## 2010-10-01 LAB — CULTURE, RESPIRATORY W GRAM STAIN: Culture: NO GROWTH

## 2010-10-01 LAB — CULTURE, RESPIRATORY: Gram Stain: NONE SEEN

## 2010-10-01 LAB — AFB CULTURE WITH SMEAR (NOT AT ARMC): Acid Fast Smear: NONE SEEN

## 2010-10-02 LAB — CBC
HCT: 33.4 % — ABNORMAL LOW (ref 36.0–46.0)
Hemoglobin: 11.5 g/dL — ABNORMAL LOW (ref 12.0–15.0)
MCHC: 34.4 g/dL (ref 30.0–36.0)
MCV: 87 fL (ref 78.0–100.0)
Platelets: 180 10*3/uL (ref 150–400)
RBC: 3.84 MIL/uL — ABNORMAL LOW (ref 3.87–5.11)
RDW: 12.9 % (ref 11.5–15.5)
WBC: 5.9 10*3/uL (ref 4.0–10.5)

## 2010-10-02 LAB — URINALYSIS, ROUTINE W REFLEX MICROSCOPIC
Bilirubin Urine: NEGATIVE
Glucose, UA: NEGATIVE mg/dL
Hgb urine dipstick: NEGATIVE
Ketones, ur: NEGATIVE mg/dL
Nitrite: NEGATIVE
Protein, ur: NEGATIVE mg/dL
Specific Gravity, Urine: 1.013 (ref 1.005–1.030)
Urobilinogen, UA: 0.2 mg/dL (ref 0.0–1.0)
pH: 6.5 (ref 5.0–8.0)

## 2010-10-02 LAB — BASIC METABOLIC PANEL
BUN: 16 mg/dL (ref 6–23)
CO2: 26 mEq/L (ref 19–32)
Calcium: 8.7 mg/dL (ref 8.4–10.5)
Chloride: 101 mEq/L (ref 96–112)
Creatinine, Ser: 0.88 mg/dL (ref 0.4–1.2)
GFR calc Af Amer: >60 mL/min (ref >60)
GFR calc non Af Amer: >60 mL/min (ref >60)
Glucose, Bld: 130 mg/dL — ABNORMAL HIGH (ref 70–99)
Potassium: 3.9 mEq/L (ref 3.5–5.1)
Sodium: 134 mEq/L — ABNORMAL LOW (ref 135–145)

## 2010-10-02 LAB — GLUCOSE, CAPILLARY
Glucose-Capillary: 149 mg/dL — ABNORMAL HIGH (ref 70–99)
Glucose-Capillary: 83 mg/dL (ref 70–99)
Glucose-Capillary: 90 mg/dL (ref 70–99)
Glucose-Capillary: 100 mg/dL — ABNORMAL HIGH (ref 70–99)
Glucose-Capillary: 102 mg/dL — ABNORMAL HIGH (ref 70–99)
Glucose-Capillary: 104 mg/dL — ABNORMAL HIGH (ref 70–99)
Glucose-Capillary: 108 mg/dL — ABNORMAL HIGH (ref 70–99)
Glucose-Capillary: 121 mg/dL — ABNORMAL HIGH (ref 70–99)
Glucose-Capillary: 155 mg/dL — ABNORMAL HIGH (ref 70–99)
Glucose-Capillary: 68 mg/dL — ABNORMAL LOW (ref 70–99)

## 2010-10-02 LAB — HEMOCCULT GUIAC POC 1CARD (OFFICE): Fecal Occult Bld: NEGATIVE

## 2010-10-02 LAB — POCT CARDIAC MARKERS
CKMB, poc: <1 ng/mL — ABNORMAL LOW (ref 1.0–8.0)
Myoglobin, poc: 58.6 ng/mL (ref 12–200)
Troponin i, poc: <0.05 ng/mL (ref 0.00–0.09)

## 2010-10-03 LAB — BASIC METABOLIC PANEL
BUN: 12 mg/dL (ref 6–23)
BUN: 14 mg/dL (ref 6–23)
CO2: 25 mEq/L (ref 19–32)
CO2: 26 mEq/L (ref 19–32)
Calcium: 8.2 mg/dL — ABNORMAL LOW (ref 8.4–10.5)
Calcium: 8.4 mg/dL (ref 8.4–10.5)
Chloride: 104 mEq/L (ref 96–112)
Chloride: 106 mEq/L (ref 96–112)
Creatinine, Ser: 0.82 mg/dL (ref 0.4–1.2)
Creatinine, Ser: 0.87 mg/dL (ref 0.4–1.2)
GFR calc Af Amer: >60 mL/min (ref >60)
GFR calc Af Amer: >60 mL/min (ref >60)
GFR calc non Af Amer: >60 mL/min (ref >60)
GFR calc non Af Amer: >60 mL/min (ref >60)
Glucose, Bld: 121 mg/dL — ABNORMAL HIGH (ref 70–99)
Glucose, Bld: 132 mg/dL — ABNORMAL HIGH (ref 70–99)
Potassium: 3.5 mEq/L (ref 3.5–5.1)
Potassium: 4 mEq/L (ref 3.5–5.1)
Sodium: 136 mEq/L (ref 135–145)
Sodium: 138 mEq/L (ref 135–145)

## 2010-10-03 LAB — POCT I-STAT 3, ART BLOOD GAS (G3+)
Bicarbonate: 25 mEq/L — ABNORMAL HIGH (ref 20.0–24.0)
O2 Saturation: 99 %
TCO2: 26 mmol/L (ref 0–100)
pCO2 arterial: 39.1 mmHg (ref 35.0–45.0)
pH, Arterial: 7.413 — ABNORMAL HIGH (ref 7.350–7.400)
pO2, Arterial: 157 mmHg — ABNORMAL HIGH (ref 80.0–100.0)

## 2010-10-03 LAB — POCT I-STAT 3, VENOUS BLOOD GAS (G3P V)
Acid-Base Excess: 1 mmol/L (ref 0.0–2.0)
Acid-base deficit: 1 mmol/L (ref 0.0–2.0)
Acid-base deficit: 6 mmol/L — ABNORMAL HIGH (ref 0.0–2.0)
Bicarbonate: 21.1 mEq/L (ref 20.0–24.0)
Bicarbonate: 25 mEq/L — ABNORMAL HIGH (ref 20.0–24.0)
Bicarbonate: 27.2 mEq/L — ABNORMAL HIGH (ref 20.0–24.0)
O2 Saturation: 56 %
O2 Saturation: 68 %
O2 Saturation: 70 %
TCO2: 23 mmol/L (ref 0–100)
TCO2: 26 mmol/L (ref 0–100)
TCO2: 29 mmol/L (ref 0–100)
pCO2, Ven: 45.7 mmHg (ref 45.0–50.0)
pCO2, Ven: 49.5 mmHg (ref 45.0–50.0)
pCO2, Ven: 49.6 mmHg (ref 45.0–50.0)
pH, Ven: 7.236 — ABNORMAL LOW (ref 7.250–7.300)
pH, Ven: 7.346 — ABNORMAL HIGH (ref 7.250–7.300)
pH, Ven: 7.348 — ABNORMAL HIGH (ref 7.250–7.300)
pO2, Ven: 35 mmHg (ref 30.0–45.0)
pO2, Ven: 38 mmHg (ref 30.0–45.0)
pO2, Ven: 39 mmHg (ref 30.0–45.0)

## 2010-10-03 LAB — CBC
HCT: 33.8 % — ABNORMAL LOW (ref 36.0–46.0)
HCT: 34.8 % — ABNORMAL LOW (ref 36.0–46.0)
Hemoglobin: 11.5 g/dL — ABNORMAL LOW (ref 12.0–15.0)
Hemoglobin: 11.9 g/dL — ABNORMAL LOW (ref 12.0–15.0)
MCHC: 34 g/dL (ref 30.0–36.0)
MCHC: 34.1 g/dL (ref 30.0–36.0)
MCV: 88.3 fL (ref 78.0–100.0)
MCV: 88.3 fL (ref 78.0–100.0)
Platelets: 157 10*3/uL (ref 150–400)
Platelets: 185 10*3/uL (ref 150–400)
RBC: 3.83 MIL/uL — ABNORMAL LOW (ref 3.87–5.11)
RBC: 3.94 MIL/uL (ref 3.87–5.11)
RDW: 13 % (ref 11.5–15.5)
RDW: 13.1 % (ref 11.5–15.5)
WBC: 5.5 10*3/uL (ref 4.0–10.5)
WBC: 5.9 10*3/uL (ref 4.0–10.5)

## 2010-10-03 LAB — PROTIME-INR
INR: 0.9 (ref 0.00–1.49)
Prothrombin Time: 12.7 seconds (ref 11.6–15.2)

## 2010-10-03 LAB — APTT: aPTT: 26 seconds (ref 24–37)

## 2010-10-03 LAB — POCT I-STAT GLUCOSE
Glucose, Bld: 112 mg/dL — ABNORMAL HIGH (ref 70–99)
Operator id: 141321

## 2010-10-03 LAB — GLUCOSE, CAPILLARY
Glucose-Capillary: 113 mg/dL — ABNORMAL HIGH (ref 70–99)
Glucose-Capillary: 132 mg/dL — ABNORMAL HIGH (ref 70–99)
Glucose-Capillary: 173 mg/dL — ABNORMAL HIGH (ref 70–99)
Glucose-Capillary: 75 mg/dL (ref 70–99)
Glucose-Capillary: 88 mg/dL (ref 70–99)

## 2010-10-06 LAB — D-DIMER, QUANTITATIVE: D-Dimer, Quant: 0.49 ug/mL-FEU — ABNORMAL HIGH (ref 0.00–0.48)

## 2010-10-06 LAB — DIFFERENTIAL
Basophils Absolute: 0 10*3/uL (ref 0.0–0.1)
Basophils Relative: 1 % (ref 0–1)
Eosinophils Absolute: 0.2 10*3/uL (ref 0.0–0.7)
Eosinophils Relative: 4 % (ref 0–5)
Lymphocytes Relative: 25 % (ref 12–46)
Lymphs Abs: 1.4 10*3/uL (ref 0.7–4.0)
Monocytes Absolute: 0.5 10*3/uL (ref 0.1–1.0)
Monocytes Relative: 8 % (ref 3–12)
Neutro Abs: 3.6 10*3/uL (ref 1.7–7.7)
Neutrophils Relative %: 63 % (ref 43–77)

## 2010-10-06 LAB — POCT I-STAT, CHEM 8
BUN: 19 mg/dL (ref 6–23)
Calcium, Ion: 1.04 mmol/L — ABNORMAL LOW (ref 1.12–1.32)
Chloride: 101 mEq/L (ref 96–112)
Creatinine, Ser: 1.1 mg/dL (ref 0.4–1.2)
Glucose, Bld: 194 mg/dL — ABNORMAL HIGH (ref 70–99)
HCT: 40 % (ref 36.0–46.0)
Hemoglobin: 13.6 g/dL (ref 12.0–15.0)
Potassium: 4.5 mEq/L (ref 3.5–5.1)
Sodium: 135 mEq/L (ref 135–145)
TCO2: 26 mmol/L (ref 0–100)

## 2010-10-06 LAB — POCT CARDIAC MARKERS
CKMB, poc: 1.6 ng/mL (ref 1.0–8.0)
Myoglobin, poc: 47.9 ng/mL (ref 12–200)
Troponin i, poc: <0.05 ng/mL (ref 0.00–0.09)

## 2010-10-06 LAB — GLUCOSE, CAPILLARY
Glucose-Capillary: 102 mg/dL — ABNORMAL HIGH (ref 70–99)
Glucose-Capillary: 109 mg/dL — ABNORMAL HIGH (ref 70–99)
Glucose-Capillary: 179 mg/dL — ABNORMAL HIGH (ref 70–99)
Glucose-Capillary: 191 mg/dL — ABNORMAL HIGH (ref 70–99)
Glucose-Capillary: 40 mg/dL — ABNORMAL LOW (ref 70–99)
Glucose-Capillary: 67 mg/dL — ABNORMAL LOW (ref 70–99)

## 2010-10-06 LAB — BASIC METABOLIC PANEL
BUN: 13 mg/dL (ref 6–23)
BUN: 17 mg/dL (ref 6–23)
CO2: 27 mEq/L (ref 19–32)
CO2: 28 mEq/L (ref 19–32)
Calcium: 8.4 mg/dL (ref 8.4–10.5)
Calcium: 8.9 mg/dL (ref 8.4–10.5)
Chloride: 102 mEq/L (ref 96–112)
Chloride: 99 mEq/L (ref 96–112)
Creatinine, Ser: 0.87 mg/dL (ref 0.4–1.2)
Creatinine, Ser: 1.12 mg/dL (ref 0.4–1.2)
GFR calc Af Amer: 57 mL/min — ABNORMAL LOW (ref >60)
GFR calc Af Amer: >60 mL/min (ref >60)
GFR calc non Af Amer: 47 mL/min — ABNORMAL LOW (ref >60)
GFR calc non Af Amer: >60 mL/min (ref >60)
Glucose, Bld: 155 mg/dL — ABNORMAL HIGH (ref 70–99)
Glucose, Bld: 191 mg/dL — ABNORMAL HIGH (ref 70–99)
Potassium: 3.6 mEq/L (ref 3.5–5.1)
Potassium: 4.2 mEq/L (ref 3.5–5.1)
Sodium: 137 mEq/L (ref 135–145)
Sodium: 137 mEq/L (ref 135–145)

## 2010-10-06 LAB — CBC
HCT: 35.1 % — ABNORMAL LOW (ref 36.0–46.0)
HCT: 40.3 % (ref 36.0–46.0)
Hemoglobin: 12.4 g/dL (ref 12.0–15.0)
Hemoglobin: 13.4 g/dL (ref 12.0–15.0)
MCHC: 33.3 g/dL (ref 30.0–36.0)
MCHC: 35.2 g/dL (ref 30.0–36.0)
MCV: 86.7 fL (ref 78.0–100.0)
MCV: 87.5 fL (ref 78.0–100.0)
Platelets: 179 10*3/uL (ref 150–400)
Platelets: 190 10*3/uL (ref 150–400)
RBC: 4.05 MIL/uL (ref 3.87–5.11)
RBC: 4.6 MIL/uL (ref 3.87–5.11)
RDW: 13.4 % (ref 11.5–15.5)
RDW: 13.5 % (ref 11.5–15.5)
WBC: 5.8 10*3/uL (ref 4.0–10.5)
WBC: 6.6 10*3/uL (ref 4.0–10.5)

## 2010-10-06 LAB — URINALYSIS, ROUTINE W REFLEX MICROSCOPIC
Bilirubin Urine: NEGATIVE
Glucose, UA: NEGATIVE mg/dL
Hgb urine dipstick: NEGATIVE
Ketones, ur: NEGATIVE mg/dL
Nitrite: NEGATIVE
Protein, ur: NEGATIVE mg/dL
Specific Gravity, Urine: 1.016 (ref 1.005–1.030)
Urobilinogen, UA: 0.2 mg/dL (ref 0.0–1.0)
pH: 6.5 (ref 5.0–8.0)

## 2010-10-06 LAB — TROPONIN I: Troponin I: <0.01 ng/mL (ref 0.00–0.06)

## 2010-10-06 LAB — CK TOTAL AND CKMB (NOT AT ARMC)
CK, MB: 1.5 ng/mL (ref 0.3–4.0)
Relative Index: INVALID (ref 0.0–2.5)
Total CK: 53 U/L (ref 7–177)

## 2010-10-06 LAB — MAGNESIUM: Magnesium: 2 mg/dL (ref 1.5–2.5)

## 2010-10-07 ENCOUNTER — Encounter: Payer: Self-pay | Admitting: Internal Medicine

## 2010-10-10 ENCOUNTER — Encounter: Payer: Self-pay | Admitting: Internal Medicine

## 2010-10-10 ENCOUNTER — Ambulatory Visit (INDEPENDENT_AMBULATORY_CARE_PROVIDER_SITE_OTHER): Payer: Medicare Other | Admitting: Internal Medicine

## 2010-10-10 DIAGNOSIS — I2510 Atherosclerotic heart disease of native coronary artery without angina pectoris: Secondary | ICD-10-CM

## 2010-10-10 DIAGNOSIS — E7800 Pure hypercholesterolemia, unspecified: Secondary | ICD-10-CM

## 2010-10-10 DIAGNOSIS — I10 Essential (primary) hypertension: Secondary | ICD-10-CM

## 2010-10-10 LAB — GLUCOSE, CAPILLARY
Glucose-Capillary: 126 mg/dL — ABNORMAL HIGH (ref 70–99)
Glucose-Capillary: 91 mg/dL (ref 70–99)

## 2010-10-10 NOTE — Patient Instructions (Signed)
mmmmmmm

## 2010-10-10 NOTE — Progress Notes (Signed)
HPI           History of Present Illness: [name] is a 77 year old with a history of CAD (s/p IWMI with stent ot RCA.  repeat stnet to the RCA for instent restenois.  She is also s/p DES to L main.  Admitted this summer for chst pain.  Cath showed only a small OM with stenosis.  Otherwise no significant disease.   Since seen she has had problems with SOB and giving out with activity.  Denies significant chest pains.  Thinks it may be dueto her lungs.  She is now on 1 mg prednisone qod.  She was seen by Dr. Dareen Piano recently who wanted her to go on 5 mg per day for her scleroderma.  She has not done this until she is seen by Dr. Craige Cotta next week.   She also has a history of hypertension, dyslipidemia, CVA.  SHe has been intolerant to statins in the past.   Current Medications (verified): 1)  Imipramine Hcl 25 Mg  Tabs (Imipramine Hcl) .... 3 Tabs At Bedtimey 2)  Synthroid 125 Mcg Tabs (Levothyroxine Sodium) .... One By Mouth Daily 3)  Lantus 100 Unit/ml Soln (Insulin Glargine) .... 8-12 Units Once Daily 4)  Nitro-Dur 0.4 Mg/hr  Pt24 (Nitroglycerin) .... Take 1 Tablet Under Tongue As Needed For Chest Pain. 5)  Ticlopidine Hcl 250 Mg  Tabs (Ticlopidine Hcl) .... Take One Tablet Two Times A Day 6)  Amlodipine Besylate 10 Mg Tabs (Amlodipine Besylate) .... Once Daily 7)  Aspirin 325 Mg  Tabs (Aspirin) .... One Tablet By Mouth Two Times A Day 8)  Nexium 40 Mg Cpdr (Esomeprazole Magnesium) .... One By Mouth Daily 9)  Ocuvite Extra   Tabs (Multiple Vitamins-Minerals) .... Two Times A Day 10)  Mucinex 600 Mg  Tb12 (Guaifenesin) .... Take 1 Tablet By Mouth Once A Day As Needed 11)  Senokot 8.6 Mg Tabs (Sennosides) .... One Tablet By Mouth Once Daily 12)  Halcyon .... As Needed 13)  Dulcolax Stool Softener 100 Mg Caps (Docusate Sodium) .... One Tablet By Mouth Once Daily 14)  Miralax  Powd (Polyethylene Glycol 3350) .... Mix One Capfull in 8 Ounce of Water and Drink Every Night 15)  Nasonex ....  Prn   Allergies (verified): 1)  ! Lopressor Hct (Metoprolol-Hydrochlorothiazide) 2)  ! Pcn 3)  ! Sulfa   Past History:   Past medical, surgical, family and social histories (including risk factors) reviewed, and no changes noted (except as noted below).   Past Medical History: Reviewed history from 10/25/2009 and no changes required. Coronary artery disease Dressler's Syndrome Hypertension Dyslipidemia Diabetes mellitus CVA 1991 Macular degeneration, legally blind Depression GERD Diverticulosis Irritable bowel syndrome Zenker's diverticulum Pneumonia as a child Tobacco abuse      - Quit 1999, smoked 1 pack per day for 40 years Emphysema      - PFT 06/16/09 FEV1 1.99(118%), FVC 2.73(111%), FEV1% 73, TLC 4.60(105%), DLCO 66%, no BD Pulmonary hypertension      - 07/25/03 right heart cath PA 35/18, PAOP 16      - 02/18/08 Echo PAS 30 mm Hg Chronic sinusitis Fibromyalgia Hypothyroidism Scleroderma followed by Dr. Dareen Piano Sjogren's Disease Raynauds phenomenon Cervical disk disease Adenomatous Colon Polyps   Past Surgical History: Reviewed history from 03/15/2010 and no changes required. Total abdominal hysterectomy 1973 Bilateral oophorectomy 2002 Spinal fusion and implantation of a plate in 1610 Removal of left lobe thyroid 1991 Cholecystectomy 1965 C3-C4 fusion 10/15/02 Dr. Trey Sailors Appendectomy  Cataract Bronchoscopy 02/22/09 Dr. Coralyn Helling Stent Surgery x 3     Family History: Reviewed history from 06/29/2009 and no changes required. mother died at age 95 from sepsis father died at age 22 from stroke No FH of Colon Cancer:   Social History: Reviewed history from 09/03/2009 and no changes required. Retired   Divorced Quit tobacco 1999 (40 pack years) No ETOH   Review of Systems        All sys reviewed.  Neg to the above probl   Vital Signs:   [name] profile:   77 year old female Height:      62 inches Weight:      139 pounds BMI:      25.52 O2 Sat:      91 % on Room air Pulse rate:   90 / minute BP sitting:   129 / 69  (left arm) Cuff size:   regular   Vitals Entered By: Burnett Kanaris, CNA (March 24, 2010 10:48 AM)   O2 Flow:  Room air   Physical Exam   Additional Exam:  [name] in NAD HEENT:  Normocephalic, atraumatic.[name]  Neck: JVP is normal.   Lungs: clear to auscultation. No rales no wheezes.   Heart: Regular rate and rhythm. Normal S1, S2. No S3.   No significant murmurs. PMI not displaced.  Abdomen:  Supple, nontender. Normal bowel sounds. No masses. No hepatomegaly.  Extremities:   Good distal pulses throughout. No lower extremity edema.   Musculoskeletal :moving all extremities.   Neuro:   alert and oriented x3.    EKG:  SR.  86 bpm.  T Wave inversion V4 to V6, III, AVF, I     Impression & Recommendations:   Problem # 3:  PURE HYPERCHOLESTEROLEMIA (ICD-272.0) Has not tolerated statins.  WIll need t oreview recent labs    Other Orders: TLB-Udip ONLY (81003-UDIP)   [name] Instructions: 1)  Your physician recommends that you schedule a follow-up appointment in: end of April 2012 2)  Your physician recommends that you continue on your current medications as directed. Please refer to the Current Medication list given to you today. 3)  Call Dr. Evlyn Courier office to make appt.         Not recorded    Transcription         Office Visit by Pricilla Riffle on 03/24/2010 10:40 AM      Summary: eph     Visit Type:  Follow-up Referring Provider:  na Primary Provider:  Georgianne Fick, MD     CC:  sob and occ chest discomfort.   History of Present Illness: [name] is a 77 year old with a history of CAD (s/p IWMI with stent ot RCA.  repeat stnet to the RCA for instent restenois.  She is also s/p DES to L main.  Admitted this summer for chst pain.  Cath showed only a small OM with stenosis.  Otherwise no significant disease.  SInce then she has not had any further chest pains.  Does get short of breath  with activity.      She also has a hisotry of CVAl, htn, dyslipidemia.   Current Medications (verified): 1)  Imipramine Hcl 25 Mg  Tabs (Imipramine Hcl) .... 3 Tabs At Bedtimey 2)  Synthroid 125 Mcg Tabs (Levothyroxine Sodium) .... One By Mouth Daily 3)  Lantus 100 Unit/ml Soln (Insulin Glargine) .... 8-12 Units Once Daily 4)  Nitro-Dur 0.4 Mg/hr  Pt24 (Nitroglycerin) .... Take 1 Tablet Under  Tongue As Needed For Chest Pain. 5)  Ticlopidine Hcl 250 Mg  Tabs (Ticlopidine Hcl) .... Take One Tablet Two Times A Day 6)  Amlodipine Besylate 10 Mg Tabs (Amlodipine Besylate) .... Once Daily 7)  Aspirin 325 Mg  Tabs (Aspirin) .... One Tablet By Mouth Two Times A Day 8)  Nexium 40 Mg Cpdr (Esomeprazole Magnesium) .... One By Mouth Daily 9)  Ocuvite Extra   Tabs (Multiple Vitamins-Minerals) .... Two Times A Day 10)  Mucinex 600 Mg  Tb12 (Guaifenesin) .... Take 1 Tablet By Mouth Once A Day As Needed 11)  Senokot 8.6 Mg Tabs (Sennosides) .... One Tablet By Mouth Once Daily 12)  Halcyon .... As Needed 13)  Dulcolax Stool Softener 100 Mg Caps (Docusate Sodium) .... One Tablet By Mouth Once Daily 14)  Miralax  Powd (Polyethylene Glycol 3350) .... Mix One Capfull in 8 Ounce of Water and Drink Every Night 15)  Nasonex .... Prn   Allergies (verified): 1)  ! Lopressor Hct (Metoprolol-Hydrochlorothiazide) 2)  ! Pcn 3)  ! Sulfa   Past History:   Past medical, surgical, family and social histories (including risk factors) reviewed, and no changes noted (except as noted below).   Past Medical History: Reviewed history from 10/25/2009 and no changes required. Coronary artery disease Dressler's Syndrome Hypertension Dyslipidemia Diabetes mellitus CVA 1991 Macular degeneration, legally blind Depression GERD Diverticulosis Irritable bowel syndrome Zenker's diverticulum Pneumonia as a child Tobacco abuse      - Quit 1999, smoked 1 pack per day for 40 years Emphysema      - PFT 06/16/09 FEV1  1.99(118%), FVC 2.73(111%), FEV1% 73, TLC 4.60(105%), DLCO 66%, no BD Pulmonary hypertension      - 07/25/03 right heart cath PA 35/18, PAOP 16      - 02/18/08 Echo PAS 30 mm Hg Chronic sinusitis Fibromyalgia Hypothyroidism Scleroderma followed by Dr. Dareen Piano Sjogren's Disease Raynauds phenomenon Cervical disk disease Adenomatous Colon Polyps   Past Surgical History: Reviewed history from 03/15/2010 and no changes required. Total abdominal hysterectomy 1973 Bilateral oophorectomy 2002 Spinal fusion and implantation of a plate in 3086 Removal of left lobe thyroid 1991 Cholecystectomy 1965 C3-C4 fusion 10/15/02 Dr. Trey Sailors Appendectomy Cataract Bronchoscopy 02/22/09 Dr. Coralyn Helling Stent Surgery x 3     Family History: Reviewed history from 06/29/2009 and no changes required. mother died at age 70 from sepsis father died at age 64 from stroke No FH of Colon Cancer:   Social History: Reviewed history from 09/03/2009 and no changes required. Retired   Divorced Quit tobacco 1999 (40 pack years) No ETOH   Review of Systems        All sys reviewed.  Neg to the above probl   Vital Signs:   [name] profile:   77 year old female Height:      62 inches Weight:      139 pounds BMI:     25.52 O2 Sat:      91 % on Room air Pulse rate:   90 / minute BP sitting:   129 / 69  (left arm) Cuff size:   regular   Vitals Entered By: Burnett Kanaris, CNA (March 24, 2010 10:48 AM)   O2 Flow:  Room air   Physical Exam   Additional Exam:  [name] in NAD HEENT:  Normocephalic, atraumatic.[name]  Neck: JVP is normal.   Lungs: clear to auscultation. No rales no wheezes.   Heart: Regular rate and rhythm. Normal S1, S2. No S3.   No  significant murmurs. PMI not displaced.  Abdomen:  Supple, nontender. Normal bowel sounds. No masses. No hepatomegaly.  Extremities:   Good distal pulses throughout. No lower extremity edema.   Musculoskeletal :moving all extremities.   Neuro:   alert  and oriented x3.       EKG   Procedure date:  03/24/2010   Findings:      NSR.  88 bpm.  T wave inversion V4-V6, II, I, AVF   Impression & Recommendations:   Problem # 1:  CORONARY ARTERY DISEASE (ICD-414.00) [name] with recent cath showing no critical narrowings.    No further chest pains.  I am not sure what lead to spell this summer.  I would keep on same regimen.   Problem # 2:  DYSPNEA (ICD-786.05) We have asked the [name] to f/u in pulmonary for reeval.   Problem # 3:  PURE HYPERCHOLESTEROLEMIA (ICD-272.0) Has not tolerated statins.  WIll need t oreview recent lats.   Other Orders: TLB-Udip ONLY (81003-UDIP)   [name] Instructions: 1)  Your physician recommends that you schedule a follow-up appointment in: end of April 2012 2)  Your physician recommends that you continue on your current medications as directed. Please refer to the Current Medication list given to you today. 3)  Call Dr. Evlyn Courier office to make appt.                     Document Information            Orders Placed This Encounter     Converted [facility] Lab [[id number] CPT(R)]    Converted [facility] EKG [[id number] CPT(R)]       Results are available for this encounter              All Flowsheet Templates (all recorded)     AMB VITALS CONVERSION Flowsheet    Custom Formula Data Flowsheet    Anthropometrics Flowsheet                    Other Encounter Related Information          Allergies & Medications         Problem List         History         [name]-Entered Questionnaires     No data filed         Allergies  Allergen Reactions  . Lopressor Hct     REACTION: decreased bp  . Penicillins     REACTION: hives  . Sulfonamide Derivatives     REACTION: hives    Current Outpatient Prescriptions  Medication Sig Dispense Refill  . amLODipine (NORVASC) 10 MG tablet Take 10 mg by mouth daily.        [name] aspirin 325 MG tablet Take 325 mg by mouth 2 (two) times daily.        [name] docusate sodium (DULCOLAX) 100 MG capsule Take 100 mg by mouth as needed.        [name] esomeprazole (NEXIUM) 40 MG capsule Take 40 mg by mouth daily before breakfast.        . guaiFENesin (MUCINEX) 600 MG 12 hr tablet Take 1,200 mg by mouth as needed.        [name] imipramine (TOFRANIL) 25 MG tablet Take 25 mg by mouth. Take 3 tablets at bedtime       . insulin glargine (LANTUS) 100 UNIT/ML injection Inject into the skin at bedtime. 8-12 untis       .  levothyroxine (SYNTHROID, LEVOTHROID) 125 MCG tablet Take 125 mcg by mouth daily.        . mometasone (NASONEX) 50 MCG/ACT nasal spray 2 sprays by Nasal route as needed.        . Multiple Vitamins-Minerals (OCUVITE EXTRA) TABS Take 1 tablet by mouth daily.        . nitroGLYCERIN (NITROSTAT) 0.4 MG SL tablet Place 0.4 mg under the tongue every 5 (five) minutes as needed.        . predniSONE (DELTASONE) 1 MG tablet Take 5 mg by mouth daily.       [name] senna (SENOKOT) 8.6 MG tablet Take 1 tablet by mouth as needed.        . ticlopidine (TICLID) 250 MG tablet Take 250 mg by mouth 2 (two) times daily.        . triazolam (HALCION) 0.25 MG tablet Take 0.25 mg by mouth at bedtime as needed.          Past Medical History  Diagnosis Date  . Coronary artery disease   . Dressler's syndrome   . Hypertension   . Dyslipidemia   . Diabetes mellitus   . CVA (cerebral infarction) 1991  . Macular degeneration   . Depression   . GERD (gastroesophageal reflux disease)   . Diverticulosis   . IBS (irritable bowel syndrome)   . Zenker's diverticulum   . Emphysema   . Pulmonary hypertension   . Chronic sinusitis   . Fibromyalgia   . Hypothyroidism   . Scleroderma     followed by Dr. Dareen Piano  . Sjogren's disease   . Raynaud's phenomenon   . Cervical disc disease   . Adenomatous colon polyp     Past Surgical History  Procedure Date  . Total abdominal hysterectomy 1973  . Bilateral oophorectomy 2002  . Spinal fusion 1997    and implantation of a plate    . Lobe thyroid removal   . Thyroid lobectomy 1991    removal of left lobe thyroid  . Cholecystectomy 1965  . Appendectomy   . Cataract extraction   . Bronchoscopy 02-22-09  . Stent surgery     x3    Family History  Problem Relation Age of Onset  . Other Mother 61    Died from sepsis  . Stroke Father 54    died    History   Social History  . Marital Status: Divorced    Spouse Name: N/A    Number of Children: N/A  . Years of Education: N/A   Occupational History  . retired    Social History Main Topics  . Smoking status: Former Smoker    Types: Cigarettes    Quit date: 06/26/1988  . Smokeless tobacco: Not on file   Comment: 40 pack years  . Alcohol Use: No  . Drug Use: No  . Sexually Active: Not on file   Other Topics Concern  . Not on file   Social History Narrative  . No narrative on file    Review of Systems:  All systems reviewed.  They are negative to the above problem except as previously stated.  Vital Signs: BP 130/64  Pulse 86  Resp 18  Ht 5\' 3"  (1.6 m)  Wt 137 lb (62.143 kg)  BMI 24.27 kg/m2  Physical Exam  HEENT:  Normocephalic, atraumatic. EOMI, PERRLA.  Neck: JVP is normal. No thyromegaly. No bruits.  Lungs: clear to auscultation. No rales no wheezes.  Heart: Regular rate and rhythm.  Normal S1, S2. No S3.   No significant murmurs. PMI not displaced.  Abdomen:  Supple, nontender. Normal bowel sounds. No masses. No hepatomegaly.  Extremities:   Good distal pulses throughout. No lower extremity edema.  Musculoskeletal :moving all extremities.  Neuro:   alert and oriented x3.  CN II-XII grossly intact.   Assessment and Plan:

## 2010-10-11 NOTE — Assessment & Plan Note (Signed)
Has been intolerant to statins.  Needs new fasting panel.  Will review labs from Dr. Burna Forts office.

## 2010-10-11 NOTE — Assessment & Plan Note (Signed)
I am not convinced that the patients current dyspnea represent angina.  I think it is more likely pulmonary and/or rheumatologic. I would not pursue cardiac testing for now.  Patient has f/u appt next wk in pulm.

## 2010-10-11 NOTE — Assessment & Plan Note (Signed)
Adequate control. 

## 2010-10-13 ENCOUNTER — Telehealth: Payer: Self-pay | Admitting: *Deleted

## 2010-10-18 ENCOUNTER — Encounter: Payer: Self-pay | Admitting: Pulmonary Disease

## 2010-10-20 ENCOUNTER — Encounter: Payer: Self-pay | Admitting: Pulmonary Disease

## 2010-10-20 ENCOUNTER — Ambulatory Visit: Payer: Medicare Other | Admitting: Pulmonary Disease

## 2010-10-20 ENCOUNTER — Ambulatory Visit (INDEPENDENT_AMBULATORY_CARE_PROVIDER_SITE_OTHER): Payer: Medicare Other | Admitting: Pulmonary Disease

## 2010-10-20 VITALS — BP 122/60 | HR 85 | Temp 98.7°F | Ht 62.0 in | Wt 136.8 lb

## 2010-10-20 DIAGNOSIS — M349 Systemic sclerosis, unspecified: Secondary | ICD-10-CM

## 2010-10-20 DIAGNOSIS — J8409 Other alveolar and parieto-alveolar conditions: Secondary | ICD-10-CM

## 2010-10-20 DIAGNOSIS — J438 Other emphysema: Secondary | ICD-10-CM

## 2010-10-20 MED ORDER — PREDNISONE 1 MG PO TABS: ORAL_TABLET | ORAL | Status: DC

## 2010-10-20 NOTE — Assessment & Plan Note (Signed)
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  She has been difficult to tolerate weaning from prednisone.  Will see if she can get away with 5 mg prednisone alternating with 2.5 mg qod.  I don't think she needs a chest xray today.

## 2010-10-20 NOTE — Patient Instructions (Signed)
Prednisone 5 mg every other day, then 2.5 mg every other day.  Call if breathing gets worse with change in prednisone Follow up in 4 months

## 2010-10-20 NOTE — Assessment & Plan Note (Signed)
She is followed by Dr. Anderson with Rheumatology. 

## 2010-10-20 NOTE — Progress Notes (Signed)
Subjective:    Patient ID: [name], female    DOB: February 05, 1931, 77 y.o.   MRN: [id number]  HPI 77 yo female former smoker with abnormal CT chest (probable BOOP) in the setting of emphysema, and scleroderma.  She was weaned down to 1 mg qod for prednisone.  She was not able to tolerate this.  She could hardly walk more than 10 feet before getting winded.  She was also getting chest pains and fever again.  She noticed that her symptoms started to recur when she was on 3 mg prednisone per day.  Since then she has been restarted on prednisone 5 mg daily, and feels much better.  Her respiratory and chest symptoms have resolved, and she is not feeling feverish.  She still has nasal congestion, but no worse than usual.   Review of Systems     Objective:   Physical Exam Filed Vitals:   10/20/10 1153  BP: 122/60  Pulse: 85  Temp: 98.7 F (37.1 C)  TempSrc: Oral  Height: 5\' 2"  (1.575 m)  Weight: 136 lb 12.8 oz (62.052 kg)  SpO2: 98%   General: normal appearance and healthy appearing.  Nose: no deformity, discharge, inflammation, or lesions  Mouth: no oral exudate, no thrush, stable lesion on tongue  Neck: no JVP  Lungs: coarse breath sounds, but no wheezing or rales  Heart: regular rhythm, normal rate, and no murmurs.  Extremities: tightening of skin over fingers  Neurologic: strength normal.  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  She has been difficult to tolerate weaning from prednisone.  Will see if she can get away with 5 mg prednisone alternating with 2.5 mg qod.  I don't think she needs a chest xray today.  Systemic sclerosis She is followed by Dr. Dareen Piano with Rheumatology.    Updated Medication List Outpatient Encounter Prescriptions as of 10/20/2010  Medication Sig Dispense Refill  . amLODipine (NORVASC) 10 MG tablet Take 10 mg by  mouth daily.        [name] aspirin 325 MG tablet Take 325 mg by mouth 2 (two) times daily.       [name] docusate sodium (DULCOLAX) 100 MG capsule Take 100 mg by mouth as needed.        [name] esomeprazole (NEXIUM) 40 MG capsule Take 40 mg by mouth daily before breakfast.        . guaiFENesin (MUCINEX) 600 MG 12 hr tablet Take 1,200 mg by mouth as needed.        [name] HYDROcodone-acetaminophen (LORCET) 10-650 MG per tablet As needed      . imipramine (TOFRANIL) 25 MG tablet Take 25 mg by mouth. Take 3 tablets at bedtime       . insulin glargine (LANTUS) 100 UNIT/ML injection Inject into the skin at bedtime. 8-12 untis       . levothyroxine (SYNTHROID, LEVOTHROID) 125 MCG tablet Take 125 mcg by mouth daily.        . mometasone (NASONEX) 50 MCG/ACT nasal spray 2 sprays by Nasal route as needed.        . Multiple Vitamins-Minerals (OCUVITE EXTRA) TABS Take 1 tablet by mouth daily.        . nitroGLYCERIN (NITROSTAT) 0.4 MG SL tablet Place 0.4 mg under the tongue every 5 (five) minutes as needed.        . predniSONE (DELTASONE) 1  MG tablet One pill every other day and 1/2 pill every other day  30 tablet  6  . senna (SENOKOT) 8.6 MG tablet Take 1 tablet by mouth as needed.        . ticlopidine (TICLID) 250 MG tablet Take 250 mg by mouth 2 (two) times daily.        . triazolam (HALCION) 0.25 MG tablet Take 0.25 mg by mouth at bedtime as needed.        [name] DISCONTD: predniSONE (DELTASONE) 1 MG tablet Take 5 mg by mouth daily.

## 2010-10-21 ENCOUNTER — Other Ambulatory Visit: Payer: Self-pay | Admitting: *Deleted

## 2010-10-21 DIAGNOSIS — E782 Mixed hyperlipidemia: Secondary | ICD-10-CM

## 2010-10-21 NOTE — Telephone Encounter (Signed)
Called patient again. She will see primary care doctor on 5/30 at 10 am and will ask for a Lipid panel to be drawn and results faxed to Dr.Ross. She is currently taking Red Yeast rice and Krill fish oil 1 time per day.

## 2010-11-08 NOTE — Assessment & Plan Note (Signed)
[name]                            CARDIOLOGY OFFICE NOTE   NAME:[name], Renee SIMMONDS                    MRN:          [id number]  DATE:02/17/2008                            DOB:          1930-10-22    IDENTIFICATION:  [name] is a 77 year old woman who I follow in  clinic.  She is status post MI on Nov 20, 2007, underwent bare-metal  stent placement to the RCA.  Post-hospitalization complicated by  continuous chest pain.  The patient went on to develop probable Dressler  syndrome.  She has been on colchicine for this.  I last saw her back in  July.   Since seen, her chest pain is gone.  She now notes shortness of breath.  She said after her intervention with the stent, her breathing was  better.  She feels now her breathing is back to the way it was before  she had her stent placed before her MI.   Her current medicines include,  1. Synthroid 125.  2. Ocuvite b.i.d.  3. Imipramine 25 t.i.d.  4. Toprol-XL 25.  5. Lantus insulin.  6. Mucinex.  7. Nasonex.  8. Ticlid 250 b.i.d. (intolerant to Plavix).  9. Pravachol 10.  10.Senna.  11.Aspirin 325.  12.Colchicine 0.6 b.i.d.   ALLERGIES:  LOPRESSOR/HYDROCHLOROTHIAZIDE, PENICILLIN, and SULFA.   PAST MEDICAL HISTORY:  1. Coronary artery disease.  2. Dyslipidemia.  3. History of Dressler syndrome.  4. History of diabetes.  5. History of CVA in 1991.  6. Hypothyroidism.  7. Macular degeneration.  8. History of fibromyalgia.  9. Status post partial thyroidectomy.  10.Status post cholecystectomy.  11.Status post hysterectomy.  12.Status post appendectomy.  13.Status post cervical spine surgery.   SOCIAL HISTORY:  The patient does not smoke.  She quit in 1999, does not  drink.   FAMILY HISTORY:  Significant for COPD, CAD, and CV disease.   REVIEW OF SYSTEMS:  Negative to the above problem except as noted.   PHYSICAL EXAMINATION:  GENERAL:  On exam, the patient is in no distress.  VITAL SIGNS:  Blood pressure is 121/67; pulse is 101 and regular; weight  140, up 2 pounds from previous.  LUNGS:  Clear.  NECK:  JVP is normal.  No bruits.  CARDIAC:  Regular rate and rhythm.  S1 and S2.  No significant murmurs.  ABDOMEN:  Supple, nontender, and no hepatomegaly.  EXTREMITIES:  Good distal pulses.  No edema.   A 12-lead EKG done on December 30, 2007, shows a sinus rhythm, 93 beats per  minute, Q-waves, possible inferior wall MI, T-wave inversion in the  inferolateral leads.  Note that it is unchanged from the previous.   IMPRESSION:  1. [name] is a 77 year old woman with a history of coronary      artery disease, underwent percutaneous transluminal coronary      angioplasty stent placement with a bare-metal stent.  Post-event      complicated by pericarditis from which she seems to be      recuperating.  I am concerned now with her shortness of breath.  We  will go ahead and repeat an echocardiogram to evaluate her filling      pressures, evaluate any possible effusion.  If this is negative, I      think she should have a repeat catheterization since her symptoms      are similar to before her stent placement back in June.  I have      discussed this with her, she understands, and would agree to      proceed.  Should continue on activities only as tolerated.      Continue on the same medicines.  2. Dyslipidemia.  We will continue on Pravachol.  3. Diabetes.  Continue on agents as listed.  I will be in touch with      the patient after I have seen the results.     Pricilla Riffle, MD, [facility]  Electronically Signed    PVR/MedQ  DD: 02/17/2008  DT: 02/18/2008  Job #: [id number]

## 2010-11-08 NOTE — Discharge Summary (Signed)
[name], [name].:  [id number]   MEDICAL RECORD [name].:  [id number]          PATIENT TYPE:  INP   LOCATION:  2027                         FACILITY:  [facility]   PHYSICIAN:  Noralyn Pick. Eden Emms, MD, FACCDATE OF BIRTH:  08-20-1930   DATE OF ADMISSION:  11/20/2007  DATE OF DISCHARGE:  11/24/2007                               DISCHARGE SUMMARY   PRIMARY CARDIOLOGIST:  Pricilla Riffle, MD, [facility].   PRIMARY CARE Ponce Skillman:  Brooke Bonito, M.D.   DISCHARGE DIAGNOSIS:  Acute inferior ST-segment elevation, myocardial  infarction.   SECONDARY DIAGNOSES:  1. Coronary artery disease status post PCI and bare-metal stenting of      the right coronary artery in this admission.  2. Hyperlipidemia with multiple statin intolerances.  3. Insulin-dependent diabetes mellitus.  4. Hypothyroidism.  5. Macular degeneration with legal blindness.  6. Fibromyalgia.  7. Status post thyroidectomy with subsequent hypothyroidism on      replacement.  8. Status post cholecystectomy.  9. Status post hysterectomy.  10.Status post appendectomy.  11.Remote tobacco abuse.   ALLERGIES:  SULFA, PENICILLIN, CODEINE.  She is intolerant to PLAVIX as  well as MULTIPLE STATINS.   PROCEDURE:  Left heart cardiac catheterization with successful PCI and  stenting of the proximal right coronary artery with placement of a 3.0 x  23 mm Vision bare-metal stent.   HISTORY OF PRESENT ILLNESS:  A 77 year old Caucasian female with prior  history of nonobstructive CAD who was in the usual state of health until  Nov 20, 2007, when after eating breakfast, she developed sudden onset of  substernal chest discomfort.  EMS was activated and ECG revealed ST  elevation in leads II, III, and aVF.  Code STEMI was called and the  patient was taken emergently to the [facility].   HOSPITAL COURSE:  Cardiac catheterization was performed revealing  totally occluded proximal right coronary artery with otherwise  nonobstructive disease.  EF was 60% with moderate inferior hypokinesis.  Attention was turned to the right coronary artery which was successfully  stented with a 3.0 x 23-mm Vision bare-metal stent.  The patient  tolerated this procedure well and postprocedure was monitored in the  coronary intensive care unit where she peaked her CK at 1303, her MB at  233.9, and her troponin I at 31.81.  She was maintained on aspirin and  Ticlid therapy (the patient is intolerant to Plavix) and was also  initiated on low-dose Pravachol and beta-blocker therapy.  Up to this  point, she has tolerated both.  She was eventually transferred out to  the floor on Nov 21, 2007, and evaluated by cardiac rehab.  She has been  ambulating without recurrent symptoms or significant limitations.  She  will be discharged home today in good condition.   DISCHARGE LABS:  Hemoglobin 11.2, hematocrit 32.8, WBC 6.9, platelets  179, MCV 85.3, sodium 137, potassium 3.4, chloride 101, CO2 31, BUN 11,  creatinine 0.83, glucose 164, total bilirubin 0.6, alkaline phosphatase  54, AST 98, ALT 25, albumin 3.5, CK 613, MB 87.6, troponin I 15.98,  total cholesterol  149, triglycerides 134, HDL 32, LDL 90, calcium 8.3,  and TSH 0.613.   DISPOSITION:  The patient will be discharged home today in good  condition.   FOLLOWUP PLANS AND APPOINTMENTS:  We will arrange for followup CBC in 1  week since the patient is on Ticlid.  She will require q.2 weeks testing  for the first 3 months provided that she is on the medication that long.  She will require at least 30 days of Ticlid.  We will also arrange for  followup with Dr. Tenny Craw in about 2 weeks, and she is asked to follow up  with Dr. Juleen China as previously scheduled.   DISCHARGE MEDICATIONS:  1. Aspirin 325 mg daily.  2. Ticlid 250 mg b.i.d. x at least 30 days, but preferably up to a      year.  3. Pravachol 10 mg nightly.  4. __________ 1 tab daily.  5. Imipramine 75 mg nightly.  6.  Synthroid 112 mcg daily.  7. Zantac 300 mg daily.  8. Senna 1 tablet daily.  9. Nitroglycerin 0.4 mg sublingual p.r.n. chest pain.  10.Lopressor 25 mg half a tablet b.i.d.   OUTSTANDING LAB STUDIES:  CBC in 1 week and then q. 2 weeks up to 3  months after that.   DURATION OF DISCHARGE ENCOUNTER:  Sixty minutes including physician  time.      Nicolasa Ducking, ANP      Noralyn Pick. Eden Emms, MD, [facility]  Electronically Signed    CB/MEDQ  D:  11/24/2007  T:  11/24/2007  Job:  [id number]   cc:   Brooke Bonito, M.D.

## 2010-11-08 NOTE — Cardiovascular Report (Signed)
NAMEMARYAN, [name] NO.:  [id number]   MEDICAL RECORD NO.:  [id number]          PATIENT TYPE:  INP   LOCATION:  2506                         FACILITY:  [facility]   PHYSICIAN:  Bevelyn Buckles. Bensimhon, MDDATE OF BIRTH:  Dec 24, 1930   DATE OF PROCEDURE:  09/14/2008  DATE OF DISCHARGE:                            CARDIAC CATHETERIZATION   PATIENT IDENTIFICATION:  Ms. [name] is a 77 year old woman with a  history of coronary artery disease.  She is status post previous  inferior wall myocardial infarction in May 2009 treated with bare-metal  stenting.  This was complicated by Dressler syndrome.  In August, she re-  presented with chest pain and she had restenosis of the right coronary  stent.  This was treated with a cutting balloon and then Endeavor drug-  eluting stent was placed.  She did have rash and hives to Plavix, and  she was switched to Ticlid.  Recently, she has been doing fairly well  but having fatigue.  This weekend, she developed back pain and some  nausea and indigestion and also some jaw pain.  She said this is similar  to the symptoms that she had before her heart attack.  She came to the  ER, troponin was negative.  Her EKG showed a slightly more prominent T-  wave inversion.  She was seen by Dr. Tenny Craw and referred for cardiac  catheterization.   PROCEDURES PERFORMED:  1. Selective coronary angiography.  2. Left heart cath.  3. Left ventriculogram.  4. StarClose femoral artery closure.   DESCRIPTION OF THE PROCEDURE:  The risks and indications were explained.  Consent was signed and placed on the chart.  Initially a 5-French JL-4  was used to engage the left main system; however, we had about 20 points  dampening with the catheter and we switched out for a 4-French catheter.  We continued to have some catheter dampening, but this was less severe.  We then used a 5-French JR-4 and angled pigtail for the remainder of the  catheterization.  All catheter  exchanges were made over the wire.  There  are no apparent complications.   Central aortic pressure 112/46 with mean of 72.  LV pressure 137/1 with  EDP of 15.  There was no aortic stenosis on pullback across the aortic  valve.   Left main had about 40-50% tubular stenosis from the ostium to the  midsection.  As above, there was about 20 mmHg damping with the catheter  insertion.  She was asymptomatic with this.  There appeared to be good  reflux.  In multiple projections, this stenosis never looked worse than  50%.   Left anterior descending was a long vessel coursing to the apex.  It  gave off a large diagonal in the proximal portion.  In the proximal  portion of the LAD just after the takeoff the diagonal, there was a 40%  lesion.  In the midsection of the LAD, there was a tubular 30% lesion in  the midsection of the diagonal and there was also a 30% lesion.   Left circumflex system was a moderate-sized system  and gave off 3 small  marginal branches.  In the ostium of the left circumflex, there was a  60% narrowing and there was a 40-50% narrowing more distally into the  takeoff of the OM-3.  In the proximal portion of the OM-1, there was 80-  90% focal lesion, this was a very small artery, less than 1.5 mm.   Right coronary was a large dominant vessel, it gave off a PDA and 2  posterolaterals.  There was a long stent in the midsection.  There was a  30% lesion in the proximal portion of the RCA prior to the stent and the  stent was widely patent.  Just after the stent, there was a 30% lesion  and in the mid PDA, there was a 40% lesion.   Left ventriculogram done in the RAO position showed an EF of 60%.  There  was no regional wall motion abnormalities.   ASSESSMENT:  1. Coronary artery disease with patent right coronary artery stent as      described above.  2. High-grade lesion in a very small first obtuse marginal.  3. Moderate left main stenosis with catheter damping but  visually not      worse than 50%.  4. Normal left ventricular function.   PLAN/DISCUSSION:  I have reviewed the films with both Dr. Juanda Chance and Dr.  Tenny Craw.  The left main lesion does not look hemodynamically significant.  At this point, we will plan on medical therapy.  If symptoms persist,  could consider outpatient Myoview to further evaluate.  She will  probably be stable for discharge home in the morning.      Bevelyn Buckles. Bensimhon, MD  Electronically Signed     DRB/MEDQ  D:  09/14/2008  T:  09/15/2008  Job:  [id number]

## 2010-11-08 NOTE — Discharge Summary (Signed)
NAMEANGELIN, CUTRONE NO.:  [id number]   MEDICAL RECORD NO.:  [id number]          PATIENT TYPE:  INP   LOCATION:  2025                         FACILITY:  [facility]   PHYSICIAN:  Pricilla Riffle, MD, FACCDATE OF BIRTH:  [birth year]   DATE OF ADMISSION:  12/13/2007  DATE OF DISCHARGE:  12/16/2007                         DISCHARGE SUMMARY - REFERRING   DISCHARGE DIAGNOSES:  1. 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  2. Recent myocardial infarction with stenting to the RCA.  3. Small pericardial effusion, this was not compromising.  History as      listed below.   BRIEF HISTORY:  According to the chart, Ms. Latif is a 77 year old  female who was recently seen by Dr. Tenny Craw in the office clinic on June 17  with recurring chest discomfort after for hospitalization of myocardial  infarction on May 27 which was treated with a bare metal stent to the  RCA.  She described her discomfort as stabbing, worse with inspiration  radiating to both shoulders associated with shortness of breath.  She  then prescribed ibuprofen.  However, has not had any improvement.  She  developed low grade fevers on the evening prior to admission with  worsening chest discomfort thus her presentation.   PAST MEDICAL HISTORY:  1. Notable for CREST syndrome.  2. History of diabetes.  3. Hypothyroidism.  4. Fibromyalgia.  5. Macular degeneration with legal blindness.  6. Asthma.  7. DJD.  8. Cholecystectomy.  9. Thyroidectomy.  10.Hyperlipidemia.   ALLERGIES:  PLAVIX INTOLERANCE, SULFA AND PENICILLIN ALLERGY.   LABORATORY:  Urinalysis was notable for hemoglobin and leukocyte  esterase.  H&H 12.0 and 35.6, normal indices, platelets 249, WBCs 11.9.  PTT 31, PT 14.6, sodium 133.  Potassium 3.8, BUN 14, creatinine 1.04,  glucose 135.  CK total MB relative index were within normal limits x3.  BNP 159.  TSH was slightly elevated at 10.06.  However, rechecked on  June 20 was 5.423.  LFTs were slightly elevated  with AST and ALT of 64  and 56.  EKG showed normal sinus rhythm, normal axis, early R-waves.  No  acute changes.   HOSPITAL COURSE:  The patient was admitted to [facility] by Dr.  Graciela Husbands.  Overnight, she ruled out myocardial infarction.  Dr. Tenny Craw felt  examined the patient June 20 and felt that she was still having some  chest discomfort, but commented that the patient felt better.  Dr. Tenny Craw  discontinued her Pravachol due to patient's request and continued  symptoms.  Echocardiogram was performed.  However, at the time of this  dictation is not available.  Dr. Tenny Craw felt that her discomfort was  compatible with Dressler syndrome and written that the echocardiogram  did show a small thin 7-10 mm effusion without filling compromise.  She  was placed on colchicine.  Dr. Tenny Craw, on June 22, felt that she could  potentially be discharged home.   DISPOSITION:  Apparently, Dr. Tenny Craw discharged the patient home, but as  that, I am dictating this from medical records.  There is no specific  discharge order or pink sheets on the chart  that represent discharge  instructions.  According to Dr. Charlott Rakes progress note, she advised the  patient to continue aspirin 325 b.i.d. and colchicine, morphine sulfate  15 mg p.r.n. and follow-up as an outpatient in 2 weeks.  She was also  instructed to hold rehab.  She will consider follow up with Dr.  Jarold Motto and further discussion of lipids.  It is noted according to  nursing note that the patient walked out of the hospital prior to her  discharge paperwork was completed.      Joellyn Rued, PA-C      Pricilla Riffle, MD, [facility]  Electronically Signed    EW/MEDQ  D:  02/06/2008  T:  02/06/2008  Job:  [id number]   cc:   Coralyn Helling, MD  Pricilla Riffle, MD, [facility], M.D.

## 2010-11-08 NOTE — Cardiovascular Report (Signed)
NAMEJADELIN, ENG NO.:  [id number]   MEDICAL RECORD NO.:  [id number]          PATIENT TYPE:  INP   LOCATION:  2506                         FACILITY:  [facility]   PHYSICIAN:  Everardo Beals. Juanda Chance, MD, FACCDATE OF BIRTH:  06-Apr-1931   DATE OF PROCEDURE:  12/04/2008  DATE OF DISCHARGE:                            CARDIAC CATHETERIZATION   CLINICAL INDICATION:  Ms. Zahm is 77 year old and has had previous  stenting of the right coronary artery and recently developed increased  chest pain.  She was studied by Dr. Gala Romney in the [facility] lab and then  brought her upstairs to IVUS of the left main because of the concerned  about significant lesion.  This did show a significant lesion in the  left main with minimal lumen area of about 4.6 square millimeters.  We  then took her off the table and discussed the options of surgery versus  stenting and decided to proceed with stenting.  She has been allergic to  Plavix, but has been on Ticlid for her right coronary artery stents for  the past 9 months.   PROCEDURE:  The procedure was performed via right femoral artery and  arterial sheath and a 6-French XB 3.0 guiding catheter with side holes.  The patient was given antiemetics bolus infusion and was given chewable  aspirin and continued on Ticlid.  We passed a Prowater wire down across  the left main lesion into the LAD and to a large diagonal branch.  We  then passed the IVUS catheter across the left main and did automatic  pullback.  Based on these measurements, we chose a 3.5 x 8-mm XIENCE  stent.  We deployed dispositioning with distal edge of the stent just  before the takeoff of the circumflex artery and the proximal edge  extending out about 2 mm from the ostium.  We deployed this with one  inflation of 12 atmospheres for 20 seconds.  We then postdilated with a  3.75 x 8-mm Vernon apex performing one inflation up to 16 atmospheres for 20  seconds.  We then performed an IVUS  run, which documented good  apposition and expansion.  Final diagnosis was then performed through  the guiding catheter.  The patient tolerated the procedure well and left  the laboratory in satisfactory condition.   RESULTS:  Initially, the stenosis in the left main was estimated at 80%  angiographically with a previously measured minimal lumen area of 4.6  square millimeters.  Following stenting, this improved to 0%.  The  minimal dimensions of the stent at pullback were 3.5 x 3.25.  The stent  extended outside the ostium about 2.5 mm.  The distal edge of the stent  was positioned just at the proximal edge of the takeoff of the  circumflex artery.   CONCLUSION:  Successful stenting of an unprotected left main ostial and  mid shaft lesion using a XIENCE drug-eluting stent and IVUS guidance  with improvement in center narrowing from 80% to 0%.   DISPOSITION:  The patient returned to [facility] room for further  observation.  She should remain on Ticlid for  at least a year and longer  if possible.      Bruce Elvera Lennox Juanda Chance, MD, [facility]  Electronically Signed     BRB/MEDQ  D:  12/04/2008  T:  12/05/2008  Job:  [id number]

## 2010-11-08 NOTE — Discharge Summary (Signed)
[name], [name] NO.:  [id number]   MEDICAL RECORD NO.:  [id number]          PATIENT TYPE:  OIB   LOCATION:  2507                         FACILITY:  [facility]   PHYSICIAN:  Pricilla Riffle, MD, FACCDATE OF BIRTH:  November 30, 1930   DATE OF ADMISSION:  11/30/2008  DATE OF DISCHARGE:  11/30/2008                               DISCHARGE SUMMARY   PRIMARY CARDIOLOGIST:  Pricilla Riffle, MD, [facility]   DISCHARGE DIAGNOSIS:  Unstable angina.   SECONDARY DIAGNOSES:  1. Coronary artery disease.  2. History of pulmonary hypertension.  3. History of hyperlipidemia.  4. Systemic sclerosis.  5. Chronic rhinitis.  6. Chronic obstructive pulmonary disease.  7. Gastritis.  8. Blindness.  9. Arthritis.  10.Thyroid disorder.  11.Diverticular disease.  12.Irritable bowel syndrome.  13.Chronic depression.  14.Hypertension.  15.History of cerebrovascular accident.  16.Type 2 diabetes mellitus.  17.Cervical spondylosis.  18.History of Zenker's diverticulum.  19.History dysphasia.  20.Gastroesophageal reflux disease.  21.History of flatulence.  22.Chronic constipation.  23.History of abdominal adhesions.  Right and left heart rate      catheterization revealing three-vessel coronary artery disease with      ostial left main stenosis with patent right coronary artery stent.      Right heart catheterization showed normal pressures.   PROCEDURE:  Intravascular ultrasound performed November 30, 2008, revealing  approximately 60% stenosis of the left main.   HISTORY OF PRESENT ILLNESS:  At the time of admission, she was a 77-year-  old female with prior history of CAD who saw Dr. Tenny Craw in clinic on November 26, 2008, with continued shortness of breath.  Decision was made to  pursue right heart catheterization.   HOSPITAL COURSE:  The patient presented to the outpatient  catheterization lab on November 30, 2008, and underwent right heart  catheterization showing normal right heart pressures.   Decision was then  made to reevaluate her coronary angiography and this revealed a  significant ostial left main stenosis with patent RCA stent.  Films were  reviewed with Dr. Juanda Chance who performed intravascular ultrasound  suggesting 60% stenosis of the ostial left main.  After some discussion,  decision was made to pursue PCI and bare metal stenting of the ostial  left main; however, this is going to be delayed until December 04, 2008.  Based on any information, decision was made to discharge the patient  home with plans for repeat catheterization PCI on December 04, 2008.   MEDICATIONS:  At the time of discharge,  1. Ticlid 40 mg b.i.d.  2. Aspirin 325 mg b.i.d.  3. Synthroid 0.125 mcg daily.  4. Imipramine 25 mg 3 tablets nightly.  5. Nexium 40 mg daily.  6. Halcion at bedtime.  7. Amlodipine 10 mg daily.  8. Pravachol 10 mg daily.  9. Lantus sliding scale insulin as previously prescribed.   OUTSTANDING LAB STUDIES:  The patient was to follow up for PCI on December 04, 2008.   Duration of discharge encountered, not applicable.      Nicolasa Ducking, ANP      Pricilla Riffle, MD, [facility]  Electronically Signed    CB/MEDQ  D:  02/19/2009  T:  02/20/2009  Job:  [id number]

## 2010-11-08 NOTE — H&P (Signed)
[name], [name]:  [id number]   MEDICAL RECORD NO.:  [id number]          PATIENT TYPE:  INP   LOCATION:  6522                         FACILITY:  [facility]   PHYSICIAN:  Luis Abed, MD, FACCDATE OF BIRTH:  Nov 14, 1930   DATE OF ADMISSION:  02/24/2008  DATE OF DISCHARGE:                              HISTORY & PHYSICAL   PRIMARY CARE PHYSICIANS:  1. Pricilla Riffle, MD, [facility]  2. Brooke Bonito, MD   PULMONOLOGIST:  Coralyn Helling, MD   CHIEF COMPLAINT:  Chest pain.   HISTORY OF PRESENT ILLNESS:  [name] is a 77 year old female with a  history of coronary artery disease.  She came to the hospital on December 14, 2007, for chest pain and was diagnosed with possible Dressler  syndrome.  She had an MI in May 2009, with a bare metal stent to the  RCA.  She was seen in the office on February 17, 2008, with chest pain and  an echocardiogram showed no pericardial effusion, so medical therapy was  initially recommended.  However, she was having some shortness of breath  and there was concern for an anginal cause of the symptoms.  Since then,  she has had repeat episodes of chest pain, which awakened her at 6:30  today.  She took sublingual nitroglycerin x3 and the pain was relieved  in approximately 15 minutes.  It was associated with nausea and  diaphoresis.  It was different from her previous chest pain episodes,  but was relieved with nitroglycerin, so Cardiology was asked to evaluate  her.   PAST MEDICAL HISTORY:  1. Status post cardiac catheterization, Nov 20, 2007, with an EF of      60% and an inferior hypokinesis RCA, complex thrombus treated with      PTCA and a bare metal stent, circumflex 40%, and LAD 35%.  A small      PL branch of the RCA was wired and dilated, but flow was not      restored.  2. CREST syndrome.  3. Diabetes.  4. Hypothyroidism.  5. Fibromyalgia.  6. History of macular degeneration.  7. Asthma.  8. Degenerative joint disease.  9.  History of pneumonia.  10.Hyperlipidemia.  11.History of Dressler syndrome in June 2009.  12.History of CVA in 1991.   SURGICAL HISTORY:  She is status post partial thyroidectomy,  cholecystectomy, hysterectomy, appendectomy, and surgical spine surgery.   ALLERGIES:  She is allergic or intolerant to LOPRESSOR,  HYDROCHLOROTHIAZIDE, PENICILLIN, SULFA, PLAVIX, and intolerant to  MULTIPLE STATINS.   SOCIAL HISTORY:  She lives in [place], alone.  She quit tobacco in  1999.  She has approximately a 40-pack year history.  She has no history  of alcohol or drug abuse.  She is retired.   FAMILY HISTORY:  Father died of a stroke.  She has a sister and a  brother with coronary artery disease.   CURRENT MEDICATIONS:  1. Synthroid 125 mcg daily.  2. Ocuvite b.i.d.  3. Imipramine 25 mg t.i.d.  4. Toprol-XL 25 mg daily.  5. Lantus insulin.  6. Mucinex  b.i.d.  7. Nasonex daily.  8. Ticlid 250 mg b.i.d.  9. Pravachol 10 mg a day.  10.Senokot daily.  11.Aspirin 325 mg daily.  12.Colchicine 0.6 mg b.i.d.   REVIEW OF SYSTEMS:  She has chronic arthralgias and muscle aches.  She  has occasional reflux symptoms.  She has not had melena, hematemesis, or  hemoptysis.  She has not had fevers or chills.  She does not cough and  has not been wheezing.  Full 14-point review of systems is otherwise  negative.   PHYSICAL EXAMINATION:  VITAL SIGNS:  Temperature is 97.3, blood pressure  126/44, heart rate 78, respiratory rate 18, and O2 saturation 98% on  room air.  GENERAL:  She is a well-developed elderly white female, in no acute  distress.  HEENT:  Normal.  NECK:  There is no lymphadenopathy, thyromegaly, bruit, or JVD noted.  CV:  Her heart is regular in rate and rhythm with an S1 and S2, and no  significant murmur, rub, or gallop noted.  ABDOMEN:  Soft and nontender with active bowel sounds.  EXTREMITIES:  There is no cyanosis, clubbing, or edema noted.  Distal  pulses are intact in all 4  extremities.  SKIN:  No rashes or lesions are noted.  MUSCULOSKELETAL:  There is no joint deformity or effusions.  NEURO:  She is alert and oriented.  Cranial nerves II-XII grossly  intact.   Chest x-ray:  No acute disease.   LABORATORY VALUES:  Hemoglobin 13.3, hematocrit 39, WBC 5.5, and  platelets 208.  Sodium 136, potassium 4.0, chloride 103, BUN 19,  creatinine 1.0, glucose 137, and point-of-care markers negative x2.   IMPRESSION:  Chest pain.  A 2-D echocardiogram was performed, February 18, 2008, and showed an ejection fraction of 60% with pulmonary artery  systolic of 30.  No pericardial effusion and no wall motion  abnormalities.  Her Dressler syndrome appears to have resolved, but she  has had recurrent chest pain responsive to nitroglycerin.  Cardiac catheterization is appropriate as a next step.  She will be  scheduled for this.  She will be continued on her home medications.  We  will cycle cardiac enzymes.  Further evaluation and treatment will  depend on the results of the above testing.       Theodore Demark, PA-C      Luis Abed, MD, [facility]  Electronically Signed    RB/MEDQ  D:  02/24/2008  T:  02/25/2008  Job:  [id number]   cc:   Brooke Bonito, M.D.

## 2010-11-08 NOTE — Assessment & Plan Note (Signed)
[facility]                            CARDIOLOGY OFFICE NOTE   NAME:[name], Renee NAZIR                    MRN:          [id number]  DATE:09/03/2008                            DOB:          [birth year]    IDENTIFICATION:  [name] is a 77 year old woman who I follow in  clinic.  I last saw her in January.  She has a history of CAD (status  post PTCA/stent in August 2009 due to in-stent restenosis).  Had  allergic reaction to PLAVIX.  She is on Ticlid.   The patient also has a history of Raynaud, followed by Dr. Dareen Piano.   She comes in today for evaluation of a rash.  Note, she was seen by Dr.  Bevely Palmer at [facility] and had a biopsy done.  She is  convinced it is the Ticlid.   Her breathing is okay.  Denies wheezing.  No chest pressure.   CURRENT MEDICINES:  1. Synthroid, question 125 mcg daily.  2. Aspirin 325.  3. Nexium.  4. Pravachol 20.  5. Ocuvite.  6. Imipramine 25 t.i.d.  7. Lantus insulin.  8. Ticlid 250 b.i.d.   PHYSICAL EXAMINATION:  GENERAL:  On exam, the patient is in no distress  at rest.  VITAL SIGNS:  Blood pressure is 123/70, pulse is 93 and regular, weight  144 and stable.  LUNGS:  Clear.  CARDIAC:  Regular rate and rhythm.  S1 and S2.  No S3.  No murmurs.  ABDOMEN:  Benign.  No hepatomegaly.  EXTREMITIES:  No edema.  SKIN:  Plaques noted that have a slight ulceration to some.  There is  one near her right nipple, one on the back of buttock  (biopsied)  several in the back of her thighs, it does not look like a usual  dermatologic reaction from a drug allergy.   1. For now, I would keep her on the same medicines. We will check a      sed rate and CBC.  I will be in touch with the dermatologist to see      with the biopsy results showed.  2. Coronary artery disease appears to be stable.  Would continue      regimen, followup in a few      months.  3. Dyslipidemia.  The patient is on Pravachol.   Will need followup.      Pricilla Riffle, MD, [facility]  Electronically Signed    PVR/MedQ  DD: 09/03/2008  DT: 09/04/2008  Job #: [id number]

## 2010-11-08 NOTE — H&P (Signed)
NAMESHIMA, COMPERE NO.:  [id number]   MEDICAL RECORD NO.:  [id number]          PATIENT TYPE:  INP   LOCATION:  2807                         FACILITY:  [facility]   PHYSICIAN:  Everardo Beals. Juanda Chance, MD, FACCDATE OF BIRTH:  09/11/1930   DATE OF ADMISSION:  12/04/2008  DATE OF DISCHARGE:                              HISTORY & PHYSICAL   PRIMARY CARDIOLOGIST:  Pricilla Riffle, MD, [facility]   CHIEF COMPLAINT:  Dyspnea on exertion/left main disease.   HISTORY OF PRESENT ILLNESS:  [name] is a 77 year old female with a  known history of coronary artery disease, S/P MI in May 2009 with  complex thrombus in the RCA treated with cardiac cath and stent to the  RCA.  Also, cath February 24, 2008, treated with DES to the RCA.  Last  June 2010, with IVUS showing significant left main disease and right  heart cath showing normal right heart pulmonary pressures, normal LV  function, triple-vessel CAD with question of napkin ring lesion in the  ostial left main with 20-mm gradient between the aorta and left main,  patent RCA stent, patent left internal mammary artery to the chest wall,  no significant left subclavian stenosis.  After last cardiac  catheterization, discussion with primary cardiologist, Dr. Gala Romney and  Dr. Charlies Constable regarding planned PCI versus CABG.  After much  discussion and consultation with the patient, planned PCI scheduled for  today.   PAST MEDICAL HISTORY:  1. CAD (see above).  2. Diabetes mellitus - Lantus.  3. Hyperlipidemia.  4. S/P CVA.  5. Fibromyalgia.  6. DJD.  7. Constipation.  8. Hypothyroidism.  9. Restless legs syndrome, June 2009.  10.Anxiety (mild).  11.GERD.  12.Raynaud phenomenon.  13.History of insomnia.   SOCIAL HISTORY:  The patient lives in [place] alone.  She is retired.  She has a 40-pack-year smoking history that is remote, quitting in 1997.  No EtOH, illicit drugs.  Herbal medication use, low sugar diet,  previously  completed cardiac rehab phase II 3 times a week.  No recent  exercise in the last couple of weeks.   FAMILY HISTORY:  Noncontributory due to the patient's advanced age.   REVIEW OF SYSTEMS:  Chronic dyspnea on exertion.  No changes recently.  All other systems reviewed and were negative.  The patient is a full  code.   ALLERGIES/INTOLERANCES:  1. LOPRESSOR.  2. HYDROCHLORIDE.  3. PENICILLIN.  4. SULFA MEDS.  5. PLAVIX.  6. STATINS (except for pravastatin).   MEDICATIONS:  1. Ticlid 250 mg p.o. b.i.d.  2. Enteric-coated aspirin 325 mg p.o. b.i.d.  3. Synthroid 125 mcg p.o. daily.  4. Nexium 40 mg p.o. daily.  5. Pravachol 20 mg p.o. daily.  6. Imipramine 25 mg p.o. t.i.d.  7. Lantus 8-12 units subcu injection q.a.m.  8. Ambien 5 mg p.o. at bedtime   PHYSICAL EXAMINATION:  VITAL SIGNS:  Temperature 98.2 degrees  Fahrenheit, BP 110/52, pulse 86, respirations 18, O2 saturation 96% on  room air.  Weight is 62.7 kg.  GENERAL:  The patient is alert and oriented x3 in no  apparent distress,  able to move and speak easily without any respiratory distress.  HEENT:  Head is normocephalic, atraumatic.  Pupils equal, round, and  reactive to light.  Extraocular muscles are intact.  Nares are patent  without discharge.  Oropharynx, without erythema or exudates.  HEART:  Rate is regular with audible S1 and S2.  No clicks, rubs, or  gallops.  1/6 systolic murmur.  Pulses 2+ and equal in both upper and  lower extremities bilaterally.  LUNGS:  Clear to auscultation bilaterally.  SKIN:  No rashes, lesions, or petechiae.  ABDOMEN:  Soft, nontender, nondistended.  Normal abdominal bowel sounds.  No rebound or guarding.  No hepatosplenomegaly.  No pulsations.  EXTREMITIES:  No clubbing, cyanosis, or edema.  MUSCULOSKELETAL:  No joint deformity or effusions.  No spinal or CVA  tenderness.  NEUROLOGIC:  Cranial nerves II through XII are grossly intact.  Strength  5/5 in all extremities and axial  groups.  Normal sensation throughout  and normal cerebellar function.   RADIOLOGY:  None.   EKG:  None.   LABORATORY DATA:  Pending.   ASSESSMENT AND PLAN:  This is a 77 year old female with a known history  of coronary artery disease described above, who presents for planned  percutaneous coronary intervention of the left main after recent  intracoronary vascular ultrasound showing significant disease and  cardiac catheterization to evaluate anatomy.  After extensive discussion with Dr. Gala Romney, Dr. Charlies Constable, and the  patient's primary cardiologist, Dr. Dietrich Pates, as well as with the  patient, the planned percutaneous coronary intervention was scheduled  for today.  We will proceed at this point.      Jarrett Ables, PAC      Bruce R. Juanda Chance, MD, [facility]  Electronically Signed    MS/MEDQ  D:  12/04/2008  T:  12/04/2008  Job:  [id number]

## 2010-11-08 NOTE — Assessment & Plan Note (Signed)
[facility]                             PULMONARY OFFICE NOTE   [name], [name]                    MRN:          [id number]  DATE:05/28/2007                            DOB:          01/11/1931    I met [name] today for evaluation of her dyspnea and abnormal  chest imaging studies.   She said that she initially started having problems with sinus  infections in February 2007, and these had been intermittent but  frequent since then.  She says that she had been treated with several  courses of antibiotics as well as prednisone and that each time she  received a course of antibiotics and prednisone her symptoms improved.  She was diagnosed with pneumonia in June 2008.  She had a chest x-ray  done on November 27, 2006, which showed a right lower lobe infiltrate.  She  had a followup chest x-ray done on January 28, 2007, which showed some  improvement in her right lower lobe infiltrate.  She had persistent  symptoms and then had a CT scan of her chest done on April 25, 2007,  which showed a new left lower lobe infiltrate as well as a right upper  lobe infiltrate and as a result a pulmonary consultation was requested.  She complains of dyspnea on exertion with minimal activity as well as a  persistent feeling of tiredness.  She does have a cough with production  of clear to yellow sputum as well as feeling a tightness in her chest.  She would also get occasional fevers.  She says, however, that recently  over the last several weeks her symptoms have improved considerably.  She does complain of allergy type symptoms, particularly during the fall  and winter seasons as well as when she is exposed to freshly cut grass.  She was given a sample of Advair but has not started using this yet.  She does complain of reflux as well as occasional cough while she is  eating and some difficulty swallowing.  She also complains of stiffness  in her joints.  She  also feels that there may be an association with a  change in her diabetes medication and improvement in her symptoms.  She  denies any recent travel history.  There are no recent sick exposures.  She has not had any exposure to animals, pets, or birds.  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  She denies any hemoptysis.  She has not had any significant change in her weight.  She denies any  skin rashes.  She also denies any abdominal pain or diarrhea.   PAST MEDICAL HISTORY:  Significant for:  1. Insulin-dependent-diabetes mellitus.  2. Elevated cholesterol.  3. A stroke in 1991.  4. Allergies.  5. Hypothyroidism.  6. Macular degeneration.  7. Pneumonia as a child.  8. Diverticulosis.  9. Fibromyalgia.  10.She has had a partial thyroidectomy.  11.She  had a cholecystectomy.  12.She has had a hysterectomy.  13.Appendectomy.  14.She had surgery of her C5-C6 spine and suffered an esophageal      injury with an osteomyelitis at that time.   ALLERGIES:  1. SULFUR.  2. PENICILLIN.   CURRENT MEDICATIONS:  1. Ocuvite.  2. Aspirin 325 mg daily.  3. Imipramine 75 mg daily.  4. Synthroid 112 mcg daily.  5. Lantus 8 units daily.  6. Mucinex as needed.  7. Acid relief as needed.  8. Nitroglycerin sublingual 0.4 mg as needed.  9. Meclizine as needed.   SOCIAL HISTORY:  She quit smoking in 1999.  She used to smoke 1 pack of  cigarettes a day for 40 years.  She is divorced.  She retired.  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   FAMILY HISTORY:  Significant for a sister who has COPD, heart disease,  lung cancer.  Her father had a stroke.  She had a brother who had lung  cancer, another brother who had coronary artery disease and her daughter  who has lupus.   REVIEW OF  SYSTEMS:  Unremarkable except for as stated above.   PHYSICAL EXAMINATION:  VITAL SIGNS:  She is 5 feet 2 inches tall, 137  pounds, temperature is 98.9, blood pressure is 114/68, heart rate is 90,  oxygen saturation 98% on room air.  HEENT:  Pupils reactive.  There is no sinus tenderness.  There is clear  nasal discharge.  There is mild erythema of the posterior pharynx.  There is no lymphadenopathy.  No jugular venous distention.  HEART:  S1 S2.  Regular rhythm.  CHEST:  There is a prolonged expiratory phase but no wheezing or rales.  ABDOMEN:  Soft, nontender.  Positive bowel sounds.  No organomegaly.  EXTREMITIES:  There was no edema, cyanosis, clubbing.  NEUROLOGIC:  No focal deficits were appreciated.   Chest x-ray in my office today shows left lower lobe atelectasis and  mild chronic bronchitic changes but no definite infiltrate.   IMPRESSION:  Symptoms suggestive of allergies and possible asthma with a  history of previous tobacco abuse as well as possible previous  occupational exposure with recurrent episodes of bilateral infiltrates  as detailed above on both chest x-ray and CT scan.  She appears to have  improved both clinically and radiographically.  It is possible that she  could have an underlying inflammatory process such as bronchiolitis  obliterans with organizing pneumonia, although this is not completely  clear.  I am still concerned that she has some degree of reactive airway disease  possibly with allergies.  I have suggested that she could try to use the  sample of Advair that she has received.  In addition, I will have her  undergo full pulmonary function tests as well as laboratory assessment  to further evaluate the possibility of a inflammatory process.  One  other concern is that she does have a history of reflux and this may be  contributing to some of her symptoms of cough as well.  I will then plan  on following up with her in about 2-3 weeks to review  the results of her  laboratory tests as well as her pulmonary function tests and determine  what other interventions will be necessary, specifically if it would be  necessary to have her undergo bronchoscopy with airway sampling,  although again given the fact that she has improved  symptomatically and  radiographically, I think it would be safe to defer that decision for  the time being.     Coralyn Helling, MD  Electronically Signed    VS/MedQ  DD: 06/06/2007  DT: 06/06/2007  Job #: [id number]

## 2010-11-08 NOTE — Assessment & Plan Note (Signed)
[facility]                            CARDIOLOGY OFFICE NOTE   NAME:[name], [name]                    MRN:          [id number]  DATE:05/11/2008                            DOB:          09-07-1930    IDENTIFICATION:  Ms. Tineo is a 77 year old woman.  I last saw her  back in September.   The patient has called in saying she feels the Toprol is making her gain  weight.  She notes some shortness of breath.  I want to see her today to  examine her.   She says since she has been on the Toprol, she has had about a 4-pound  weight gain.  She can really feel this.  She notes some shortness of  breath, but she says it is not as bad as when she had her  catheterization with angioplasty back in August.   CURRENT MEDICINES:  1. Ocuvite b.i.d.  2. Imipramine 25 t.i.d.  3. Toprol-XL 25.  4. Lantus as directed.  5. Mucinex daily.  6. Ticlid 250 b.i.d.  7. Nasonex 50 daily.  8. Synthroid 125.  9. Aspirin 325.  10.Nexium.   REVIEW OF SYSTEMS:  Note, the patient did not tolerate the Pravachol.   PHYSICAL EXAMINATION:  GENERAL:  The patient is in no distress at rest.  VITAL SIGNS:  Her blood pressure is 111/61, pulse is not taken, weight  144.  NECK:  JVP is normal.  LUNGS:  Clear.  No rales.  CARDIAC:  Regular rate and rhythm, S1 and S2.  No S3, no murmurs.  ABDOMEN:  Benign.  No hepatomegaly.  EXTREMITIES:  No edema.   IMPRESSION AND PLAN:  1. 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  To continue on Ticlid for at least      a year.  Bimonthly CBC.  I am not convinced the patient's shortness      of breath and fatigue is due to renarrowing in this region.  She is      convinced that  it is due to her Toprol.  She can try backing down      to 12.5 and then consider tapering off.  Call us and let us know      how she feels.  2. Dyslipidemia.  The patient is very difficult.  She has been on      several medicines for her cholesterol and has not tolerated them.      Pravachol was the last one she tried.  I told him my big concern is      without treating, she will continue to have progression.  We will      need to review for now with hold and be in touch with her.  Again,      she had been  on Zocor and she thought Lipitor in the past as well      as Crestor.   I will set to see the patient back in a couple of months, sooner if  problems develop.  Again, she will call depending on how she feels with  the taper and Toprol.     Pricilla Riffle, MD, [facility]  Electronically Signed    PVR/MedQ  DD: 05/11/2008  DT: 05/12/2008  Job #: [id number]

## 2010-11-08 NOTE — Assessment & Plan Note (Signed)
[facility]                            CARDIOLOGY OFFICE NOTE   NAME:Turano, ARYAH DOERING                    MRN:          [id number]  DATE:12/30/2007                            DOB:          [birth year]    IDENTIFICATION:  Ms. Pesantez is a 77 year old woman who I have seen  recently in the hospital.  She is status post inferior wall MI and  underwent intervention with a bare-metal stent to the RCA.  Post  procedure complicated by pericarditis.  She has been maintained now for  a couple of weeks on colchicine and aspirin.  I have talked to her on  the phone regarding this.   Since she left the hospital, she has been doing better.  She still has  had intermittent episodes or days pain.  Yesterday, she was doing okay.  Today, she has had chest pain.  The pain, she reports, is more  positional if she lays back or breathes in.  Also complaining of  bilateral shoulder pain, complains of low-grade fevers though her  temperature now in the office is 98.6, at home had been 99.   CURRENT MEDICATIONS:  1. Ocuvite b.i.d.  2. Imipramine t.i.d.  3. Synthroid 112 daily.  4. Zantac 150.  5. Toprol-XL 25.  6. Lantus as directed.  7. Mucinex 600.  8. Asmanex.  9. Nasonex 50.  10.Ticlid 250 b.i.d.  11.Pravachol 110.  12.Senna daily.  13.Aspirin 325.  14.Colchicine 0.6 b.i.d.   PHYSICAL EXAMINATION:  GENERAL:  The patient is in no acute distress.  VITAL SIGNS:  Blood pressure is 124/66, pulse is 98, temperature is  98.6, and weight is 138 pounds.  LUNGS:  Clear.  CARDIAC:  Regular rate and rhythm.  S1 and S2.  No S3.  No significant  murmurs or rubs.  ABDOMEN:  No hepatomegaly.  EXTREMITIES:  No edema.   LABORATORY DATA:  From December 20, 2007, include a hemoglobin of 11.4, WBC  of 9.7, and platelets of 322.  Her sedimentation rate is 67, BUN and  creatinine of 12 and 0.9.  BNP of 345.  I have discussed this with the  patient already.   IMPRESSION:  1.  Pericarditis.  I think clinically she is doing better.  She does      have some ups and downs, and I have told her this.  I would repeat      labs today.  I would continue on the colchicine.  I do not see any      evidence for bacterial infection.  I am not convinced that the      patient's symptoms are related to anything other than the      pericarditis.  Again, she has had a very unusual presentation.  I      would continue on current regimen for now.  We will check labs      today.  2. Coronary artery disease.  Again as noted above, she will continue      on Ticlid.  We will need periodic bimonthly CBC.  3. Dyslipidemia.  The patient is very  sensitive.  I think she is      taking Pravachol.  I will need to double check with her.  She did      not bring her medicines in today.  There is always a concern of      side effects of medicines with her.   I will need to be in touch with the patient regarding followup.  Again,  she should refrain from cardiac rehab for now until her symptoms  improve.     Pricilla Riffle, MD, [facility]  Electronically Signed    PVR/MedQ  DD: 12/31/2007  DT: 01/01/2008  Job #: [id number]

## 2010-11-08 NOTE — Cardiovascular Report (Signed)
NAMEJONIYA, [name] NO.:  [id number]   MEDICAL RECORD NO.:  [id number]          PATIENT TYPE:  INP   LOCATION:  6522                         FACILITY:  [facility]   PHYSICIAN:  Bevelyn Buckles. Bensimhon, MDDATE OF BIRTH:  June 07, 1931   DATE OF PROCEDURE:  02/24/2008  DATE OF DISCHARGE:                            CARDIAC CATHETERIZATION   CARDIOLOGIST:  Pricilla Riffle, MD, [facility]   INDICATIONS:  [name] is a 77 year old woman with a history of  diabetes, hyperlipidemia, and fibromyalgia.  She was admitted in May  2009 with non-ST elevation myocardial infarction.  She underwent bare-  metal stenting of the mid RCA with a Multilink Vision stent.  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  Today, she experienced chest pressure like  a band around her chest which was concerning for unstable angina.  She  was seen by Dr. Myrtis Ser in the ER and referred for cardiac catheterization.   PROCEDURES PERFORMED:  1. Selective coronary angiography.  2. Left heart catheterization.  3. Left ventriculogram.   DESCRIPTION OF PROCEDURE:  The risks and indication were explained.  Consent was signed.  Placed on the chart.  A 5-French arterial sheath  was placed in the right femoral artery using a modified Seldinger  technique.  Standard catheters including JL-4, JR-4, and angled pigtail  used for catheterization.  All catheter exchanges made over wire.  There  are no apparent complications.  Central aortic pressure is 133/56 with a  mean of 90.  LV pressure is 132/8 with an EDP of 14.  There is no aortic  stenosis.   Left main was small but angiographically normal.   LAD coursed to the apex and had a 30%-40% tubular lesion in the  midsection.   Left circumflex was narrow caliber gave off a large ramus branch and  small OM-1 a small OM-2, and moderate sized OM-3.  Ramus branch was free  of critical disease.  Left  circumflex has 30% ostial lesion, 50% distal  lesion and followed  by a 40% lesion.   Right coronary artery was a dominant vessel, gave off a PDA and two  posterolaterals.  In the midsection there was stented segment.  There is  about 70%-80% in-stent restenosis.  Following this, there were several  30% blockages in the distal RCA and PDA.   Left ventriculogram done in the RAO position showed a EF of 70% with no  regional wall motion abnormalities.   ASSESSMENT:  1. Nonobstructive coronary artery disease in the left anterior      descending artery and left circumflex with narrow caliber vessels.  2. Significant in-stent restenosis in the mid right coronary artery      bare-metal stent.  3. Normal left ventricular function.   PLAN/DISCUSSION:  [name] has evidence of significant in-stent  restenosis.  She has been intolerant to Plavix due to a rash.  She has  been maintained on Ticlid.  I have discussed this with Dr. Tenny Craw.  Apparently, she has been compliant with her Ticlid.  Given the  fact that  she has diabetes, I think she would do best with a drug-eluting stent.  I have discussed this with Dr. Clifton James who will proceed with  intervention.      Bevelyn Buckles. Bensimhon, MD  Electronically Signed     DRB/MEDQ  D:  02/24/2008  T:  02/25/2008  Job:  [id number]

## 2010-11-08 NOTE — Cardiovascular Report (Signed)
NAMEHUYEN, PERAZZO NO.:  [id number]   MEDICAL RECORD NO.:  [id number]          PATIENT TYPE:  INP   LOCATION:  6522                         FACILITY:  [facility]   PHYSICIAN:  Verne Carrow, MDDATE OF BIRTH:  02-Feb-1931   DATE OF PROCEDURE:  02/24/2008  DATE OF DISCHARGE:                            CARDIAC CATHETERIZATION   PCI Report   INDICATIONS:  A 77 year old white female with known coronary artery  disease admitted with recent episodes of chest pain and shortness of  breath.  The patient had a bare-metal stent placed in her mid right  coronary artery in May 2009.   OPERATOR:  Verne Carrow, MD   PROCEDURE:  1. Percutaneous coronary intervention with placement of an Endeavor      drug-eluting stent in an area of in-stent restenosis in the mid      right coronary artery.   DETAILS OF PROCEDURE:  The patient underwent a diagnostic left heart  catheterization by Dr. Arvilla Meres.  I was called to review the  heart catheterization films after Dr. Gala Romney noted a significant in-  stent restenosis of the bare-metal stent that had been placed in the mid  right coronary artery in May 2009.  We did talk before the case about  the patient's intolerance to Plavix.  I also spoke to the patient's  primary cardiologist, Dr. Dietrich Pates about the patient's ability to take  Ticlid in the place of Plavix since she did have a severe allergic  reaction to the Plavix.  The patient has been taking the Ticlid for  several months now with no allergies or adverse side effects.  We  elected at this point to proceed with an intervention.  A 6-French JR-4  guiding catheter was used to engage the right coronary artery.  A Cougar  intracoronary wire was then passed down the length of the right coronary  artery without difficulty.  A 3.0 x 15 mm cutting balloon was used for  predilatation of the lesion with a good angiographic result.  I did feel  that if I were to  not place a stent here, there was a very high chance  that the patient would have recurrence of her in-stent restenosis.  I  elected to place an Endeavor 3.0 x 30 mm drug-eluting stent in the mid  right coronary artery essentially covering the old bare-metal stent.  A  3.25 x 21 mm noncompliance Sprinter  balloon was used for postdilatation  of the stent.  There was TIMI III flow down the vessel both before and  after the intervention.  The prestenosis of the lesion was 80% and was  0% following the intervention.  The patient tolerated the procedure  well.  As stated before, she would not be loaded with Plavix as she is  intolerant to this.  She will take Ticlid instead.  Angiomax was used  for anticoagulation during the procedure.  The patient was taken to the  recovery area in stable condition.   IMPRESSION:  In-stent restenosis of the mid right coronary artery.  Bare-  metal stent placed in May 2009, now  status post successful percutaneous  coronary intervention with placement of an Endeavor drug-eluting stent  in the mid right coronary artery.   RECOMMENDATIONS:  This patient should be continued on aspirin, statin,  and beta blockade.  She will be continue on her Ticlid for at least 1  year, but likely for her lifetime if she continues to tolerate it.      Verne Carrow, MD  Electronically Signed     CM/MEDQ  D:  02/24/2008  T:  02/25/2008  Job:  [id number]   cc:   Pricilla Riffle, MD, [facility]  Bevelyn Buckles. Bensimhon, MD

## 2010-11-08 NOTE — Assessment & Plan Note (Signed)
[facility]                            CARDIOLOGY OFFICE NOTE   NAME:[name], [name]                    MRN:          [id number]  DATE:11/07/2007                            DOB:          1931/04/26    IDENTIFICATION:  [name] is a 77 year old woman with CAD (moderate  disease by cath in 2006 with a 70% proximal RCA, 30% LAD; Myoview done  showed no ischemia).  She was last seen in May of last year.   In the interval, she said she is been ill all last summer with multiple  pneumonias. She blames it on changes in her diabetic medicines.  She has  been seen by Coralyn Helling, MD. PFTs were done that were normal, but she  was referred to Dr. Phylliss Bob who thought that she indeed has scleroderma.  She is also being seen by Trey Sailors, MD for her back.  He recommends  injections, which she is reflecting on.   She denies chest pain, but says she just gives out when she does things.   CURRENT MEDICINES:  1. Ocuvite two daily.  2. Aspirin 325.  3. Fish oil.  4. Zantac.  5. Imipramine 75.  6. Synthroid 0.112.  7. Dulcolax.  8. Lantus.  9. Nasonex.  10.Mucinex.   PHYSICAL EXAM:  The patient is in no distress.  Blood pressure 124/65,  pulse 77, weight 141.  NECK:  No bruits.  LUNGS:  Clear.  No rales.  CARDIAC:  Regular rate and rhythm, S1-S2, no S3, no murmurs.  ABDOMEN:  Benign.  EXTREMITIES:  No edema.   A 12-lead EKG: Normal sinus rhythm, 77 beats per minute.   IMPRESSION:  1. Coronary artery disease.  I am concerned about the shortness of      breath.  Her PFTs were really near normal.  I would schedule a      Myoview scan to rule out inducible ischemia.  Continue on current      regimen for now.  2. Dyslipidemia.  THE PATIENT SAYS SHE IS INTOLERANT TO STATINS.  She      has tried Crestor,  Zocor and a think Lipitor. She did not try      Pravachol. I would like to get a fasting lipid panel and will do      this when she comes in.   I will  be in touch with her with test results. Tentatively plan follow-  up for the fall, but again depending on her symptoms and  the results  workup further.     Pricilla Riffle, MD, [facility]  Electronically Signed    PVR/MedQ  DD: 11/07/2007  DT: 11/07/2007  Job #: [id number]

## 2010-11-08 NOTE — H&P (Signed)
[name], STEHR NO.:  [id number]   MEDICAL RECORD NO.:  [id number]          PATIENT TYPE:  INP   LOCATION:  2901                         FACILITY:  [facility]   PHYSICIAN:  Veverly Fells. Excell Seltzer, MD  DATE OF BIRTH:  [birth year]   DATE OF ADMISSION:  11/20/2007  DATE OF DISCHARGE:                              HISTORY & PHYSICAL   PRIMARY CARDIOLOGIST:  Pricilla Riffle, MD, [facility]   PRIMARY CARE PHYSICIAN:  Brooke Bonito, MD   HISTORY OF PRESENT ILLNESS:  This is a 77 year old Caucasian female with  known history of CAD with catheterization in 2005 revealing a 70%  proximal right coronary artery.  The patient also had evidence of  pulmonary hypertension with also history of insulin-dependent diabetes,  dyslipidemia, and intolerance of statins.  The patient had complaints of  substernal chest pain, which she described as burning, which occurred  around 8:30 a.m. after eating breakfast today.  The patient's daughter  called EMS secondary to these symptoms.  On arrival EKG, her EMS  revealed ST elevation in  leads II, III, and aVF.  Code STEMI was  called.  The patient was brought to [facility] emergently and placed in  cardiac catheterization lab.  On further evaluation, the patient  admitted to 2 prior episodes yesterday, which went away on its own,  which again was described as substernal burning nonradiation.  She had  associated nausea and weakness.  The patient has been complaining of  some chronic shortness of breath and has been seen by Dr. Dietrich Pates,  who had planned a stress Myoview, which was scheduled for tomorrow  secondary to these symptoms; however, plans have been changed, and she  is currently undergoing emergent cardiac catheterization.   REVIEW OF SYSTEMS:  Positive for chest pain and burning substernal,  shortness of breath, and low-grade nausea with weakness, otherwise  negative.   PAST MEDICAL HISTORY:  1. Coronary artery disease.      a.     Status  post cardiac catheterization in 2005 revealing a 70%       proximal right coronary artery, 40% ostial LAD narrowing, and 30-       40% mid LAD narrowing, and 60% circumflex ostial narrowing with       normal LVEF of 60%.  2. Dyslipidemia.  3. Hypercholesterolemia.  4. Insulin-dependent diabetes.  5. Hypothyroidism.  6. Macular degeneration.  7. Fibromyalgia.   PAST SURGICAL HISTORY:  1. Thyroidectomy.  2. Cholecystectomy.  3. Hysterectomy.  4. Appendectomy.   SOCIAL HISTORY:  The patient lives in [place].  She is divorced.  She  is a 40-pack-year smoker, but quit in 1999.  No EtOH.  No drug use.   FAMILY HISTORY:  Father died of a CVA.  She has a sister with COPD, CAD,  and lung cancer, and a brother with CAD.   CURRENT MEDICATIONS:  1. Ocuvite once a day.  2. Aspirin 325 mg daily.  3. Imipramine 75 mg daily.  4. Synthroid 112 mcg daily.  5. Lantus 80 units daily.  6. Mucinex 600mg  daily.  7. Acid  relief medication p.r.n.  8. Meclizine p.r.n.  9. Nitroglycerin sublingual p.r.n.   ALLERGIES:  SULFA, PENICILLIN, CODEINE.  Intolerant to PLAVIX and  STATIN.   CURRENT LABS:  Pending.   PHYSICAL EXAMINATION:  VITAL SIGNS:  Blood pressure 128/71, pulse 87,  respirations 26, and O2 sat 100% on 2 liters.  HEENT:  Head is normocephalic and atraumatic.  Eyes PERRLA.  Mucous  membranes in mouth are pink and moist.  Tongue is midline.  NECK:  Supple.  There is no JVD.  No carotid bruits.  No thyromegaly is  noted.  CARDIOVASCULAR:  Regular rate and rhythm without murmurs, rubs, or  gallops at present.  Pulses were 2+ and equal, radial, dorsalis pedis,  and femoral.  LUNGS:  Clear to auscultation without wheezes, rales, or rhonchi.  SKIN:  Warm and dry.  ABDOMEN:  Soft and nontender with 2+ bowel sounds.  EXTREMITIES:  Without clubbing, cyanosis, or edema.  NEURO:  Cranial nerves II  through XII are grossly intact.   IMPRESSION:  1. Acute inferior myocardial infarction  with ST elevation noted in      leads II, III, and aVF per EKG.  2. Insulin-dependent diabetes.  3. Dyslipidemia.  4. Hypothyroidism.  5. Intolerance to statins and Plavix.   PLAN:  This is a 77 year old Caucasian female with known history of  coronary artery disease and admitted with acute inferior myocardial  infarction with totally occluded right coronary artery per  catheterization.  The patient is undergoing emergent cardiac  catheterization with percutaneous coronary intervention per Dr. Excell Seltzer.  More recommendations per Dr. Excell Seltzer for hospital course based upon the  patient's response to treatment.      Bettey Mare. Lyman Bishop, NP      Veverly Fells. Excell Seltzer, MD  Electronically Signed    KML/MEDQ  D:  11/20/2007  T:  11/21/2007  Job:  [id number]   cc:   Brooke Bonito, M.D.

## 2010-11-08 NOTE — Assessment & Plan Note (Signed)
[facility]                            CARDIOLOGY OFFICE NOTE   NAME:[name], [name]                    MRN:          [id number]  DATE:12/11/2007                            DOB:          04-Jul-1930    PRIMARY CARDIOLOGIST:  Pricilla Riffle, MD, [facility].   PULMONARY PHYSICIAN:  Coralyn Helling, MD   Ms. [name] is a 77 year old white female patient who had a recent MI  treated with bare-metal stent to the RCA on 11/20/2007 and was placed on  Ticlid due to Plavix intolerance.  She was readmitted to the hospital  with recurrent shoulder and neck discomfort of question etiology.  She  had an elevated D-dimer, but had a negative lower extremity Doppler for  DVT and pulmonary embolus suspicion as well.  The patient saw Dr. Craige Cotta  yesterday and told her to take some Motrin for it, but she has not gone  to the store to buy this yet.  Today, she still says she has some chest  pain.  She describes as a soreness in the center of her chest that goes  to her back.  She just feels like it is constantly there and is sore.  She says that sometimes, she does not feel like she can take a deep  breath, but overall, she is feeling better and wants to start cardiac  rehab.   CURRENT MEDICATIONS:  1. Ocuvite b.i.d.  2. Imipramine 25 mg t.i.d.  3. Synthroid 112 mcg daily.  4. Zantac 150 mg daily.  5. Toprol-XL 25 mg daily.  6. Lantus as directed.  7. Mucinex 600 mg daily.  8. Asmanex 220 two nightly.  9. Nasonex 50 daily.  10.Ticlid 250 b.i.d.  11.Pravachol 10 mg nightly.  12.Senna daily.  13.Aspirin 325 daily.   PHYSICAL EXAMINATION:  GENERAL:  This is a pleasant 77 year old white  female in no acute distress.  VITAL SIGNS:  Blood pressure 129/65, pulse 80, and weight 140.  NECK:  Without JVD, HJR, bruit, or thyroid enlargement.  LUNGS:  Clear anterior, posterior, and lateral.  HEART:  Regular rate and rhythm at 67 beats per minute.  Normal S1 and  S2.  No murmur,  rub, bruit, thrill, or heave noted.  ABDOMEN:  Soft without organomegaly, masses, lesions, or abnormal  tenderness.  EXTREMITIES:  Without cyanosis or edema.  She has good distal pulses.   EKG, normal sinus rhythm, nonspecific ST changes.  No acute change.   IMPRESSION:  1. Status post recent myocardial infarction with bare-metal stent to      the right coronary artery on Nov 20, 2007.  2. Recurrent shoulder, neck, and chest soreness, question etiology,      elevated D-dimer in the hospital, negative lower extremity Doppler.  3. Multiple drug sensitivities.  4. Hyperlipidemia.  5. Hypothyroidism.  6. Fibromyalgia.  7. Macular degeneration with legal blindness.  8. Pulmonary abnormalities with recent pneumonia.  9. Asthma.  10.Sclerodactyly.  11.Esophageal motility disorder.  12.Raynaud's syndrome.  13.Scleroderma.  14.Calcinosis cutis, Raynaud phenomenon, esophageal motility disorder,      sclerodactyly, and telangiectasia syndrome.  15.Diabetes mellitus.  PLAN:  At this time, I told the patient to call if she has continued  chest pain after trying the Motrin to see if that would improve it.  She  can go ahead and enroll in cardiac rehab and she will follow up with Dr.  Tenny Craw within a month.      Jacolyn Reedy, PA-C  Electronically Signed      Doylene Canning. Ladona Ridgel, MD  Electronically Signed   ML/MedQ  DD: 12/11/2007  DT: 12/12/2007  Job #: [id number]   cc:   Coralyn Helling, MD

## 2010-11-08 NOTE — Assessment & Plan Note (Signed)
[facility]                            CARDIOLOGY OFFICE NOTE   NAME:Rothlisberger, COLLYN SELK                    MRN:          [id number]  DATE:07/10/2008                            DOB:          06/04/1931    IDENTIFICATION:  Ms. Gunner is a 77 year old woman with coronary  artery disease status post MI with re-PTCA stent due to restenosis in  August 2009 on Ticlid and also history of dyslipidemia.   I got a call from Dr. Dareen Piano regarding her Raynaud's, which she has  problems in her hands.  He had recommended starting her on a calcium-  channel blocker, which she has not filled yet.  I told him it was okay  for her to come off the beta-blocker for this to taper back.   The patient also is complaining of back pain and is followed by Dr. Channing Mutters.  Any plans for surgery that were delayed because of the recent  intervention in August 2009.   Since seen in clinic, the patient has been doing okay.  Breathing is  okay.  No real change.  No significant chest pressure.   CURRENT MEDICINES:  1. Imipramine 25 t.i.d.  2. Lantus insulin as directed.  3. Ticlid 250 b.i.d.  4. Synthroid 125 mcg daily.  5. Aspirin 325 b.i.d.  6. Nexium.  7. Ocuvite.  8. Pravachol 10.   PHYSICAL EXAMINATION:  GENERAL:  On exam, the patient is in no distress  at rest.  Blood pressure is 124/60, pulse 97, weight 143.  NECK:  JVP is normal.  LUNGS:  Clear without rales.  CARDIAC:  Regular rate and rhythm, S1 and S2.  No S3, no significant  murmurs.  ABDOMEN:  Benign.  EXTREMITIES:  No edema.   A 12-lead EKG shows normal sinus rhythm at a rate of 97 beats per  minute.  T-wave inversion in leads V4 through V6, I, II, aVL.  Note,  these changes have been in previous EKGs.   IMPRESSION:  1. Coronary artery disease appears to be clinically stable.  I would      keep her on the same regimen.  We will need to follow her      platelets.  She should not come off Ticlid until after  February 23, 2009 given her drug-eluting stent.  Once the procedure is done, she      should go back on it since it was for restenosis.  2. Dyslipidemia, intolerant to medicines on 10 of Pravachol      tolerating.  We will need to follow.   I will set to see the patient back later this summer, sooner if problems  develop.     Pricilla Riffle, MD, [facility]  Electronically Signed    PVR/MedQ  DD: 07/10/2008  DT: 07/11/2008  Job #: [id number]   cc:   Azzie Roup, MD  Payton Doughty, M.D.

## 2010-11-08 NOTE — Discharge Summary (Signed)
NAMEJANITH, NIELSON NO.:  [id number]   MEDICAL RECORD NO.:  [id number]          PATIENT TYPE:  INP   LOCATION:  2027                         FACILITY:  [facility]   PHYSICIAN:  Noralyn Pick. Eden Emms, MD, FACCDATE OF BIRTH:  01/30/1931   DATE OF ADMISSION:  11/20/2007  DATE OF DISCHARGE:  11/24/2007                               DISCHARGE SUMMARY   ADDENDUM   DISCHARGE MEDICATIONS:  Lantus 8 units subcu nightly.      Nicolasa Ducking, ANP      Noralyn Pick. Eden Emms, MD, [facility]  Electronically Signed    CB/MEDQ  D:  11/24/2007  T:  11/24/2007  Job:  [id number]

## 2010-11-08 NOTE — Op Note (Signed)
NAMEJOZEE, HAMMER             ACCOUNT NO.:  [id number]   MEDICAL RECORD NO.:  [id number]          PATIENT TYPE:  AMB   LOCATION:  CARD                         FACILITY:  [facility]   PHYSICIAN:  Coralyn Helling, MD        DATE OF BIRTH:  10-20-1930   DATE OF PROCEDURE:  02/22/2009  DATE OF DISCHARGE:                               OPERATIVE REPORT   PROCEDURE:  Bronchoscopy.   PREOPERATIVE DIAGNOSIS:  Abnormal chest x-ray, rule out chronic  infection.   POSTOPERATIVE DIAGNOSIS:  Abnormal chest x-ray, rule out chronic  infection.   INDICATION:  Ms. Portner is a 77 year old female who has a history of  scleroderma.  She had a CT scan in May and then in August 2010, which  showed shifting infiltrates more in the right upper lobe.  She has had  persistent low-grade temperature up to 100 degrees Fahrenheit and  generalized malaise.  She had failed outpatient antibiotic therapy.  She  is therefore scheduled to undergo bronchoscopy with airway inspection,  BAL and brush cytology.  Of note is that the patient had coronary  intervention in June 2010, with a drug-eluting stent and as a result  requires the use aspirin and Ticlid, and it was felt that the risks was  prohibitive as far as holding these medications.  Therefore, biopsy was  not done.  The procedure was explained to the patient.  Risks were  detailed as bleeding, infection, pneumothorax and non-diagnosis.  Consent form was signed.   The patient was brought to the bronchoscopy suite.  Of note is that she  had difficulty with IV access, but fortunately with the assistance of  Anesthesia, we were able to gain peripheral IV access.  The patient was  then given nebulized lidocaine for topical anesthesia of her oropharynx.  She was given a total of 8 mg of Versed and 100 mcg of fentanyl  intravenously for sedation and analgesia.  The bronchoscope was entered  orally.  The vocal cords were visualized and appeared to be chronically  inflamed and swollen, but did have normal motion.  A total of 2 mL of  lidocaine was instilled for topical anesthesia of the airways.  The  bronchoscope was entered into the trachea.  The carina was visualized  and did not appear to be widened.  The bronchoscope was then entered  into the left main bronchus.  The mucosa appeared pale.  The left upper  lingular and lower lobe orifices were well visualized and there was no  obvious endobronchial lesions.  The bronchoscope was then entered into  the right main bronchus.  Again, the mucosa appeared pale.  The  bronchoscope was entered into the right middle and right lower lobe  orifices and there was no obvious endobronchial lesions.  Focus was then  shifted to the right upper lobe bronchus.  Again, there was no obvious  endobronchial lesions.  Then a total of 120 mL of saline was instilled  to the anterior segment of the right upper lobe with approximately 40 mL  of cloudy white fluid returned.  Then brush cytology was  done from the  anterior segment of the right upper lobe.  There was a minimal amount of  bleeding which resolved spontaneously.  Otherwise, the airways appeared  clean and the bronchoscope was withdrawn.  Of note is that the patient  had transient episode of oxygen desaturation down to 87%, but this  improved with jaw thrust and increase in her supplemental oxygen.  There  was no other obvious complications during the procedure and the patient  was returned  to the recovery room in stable condition.  The specimens will be sent  for cytology, microbiology, cell count with differential and CD4 to CD8  ratio.  I will call the patient once results are available.  She is to  follow-up with me in the office in the next 1-2 weeks.      Coralyn Helling, MD  Electronically Signed     VS/MEDQ  D:  02/22/2009  T:  02/22/2009  Job:  [id number]

## 2010-11-08 NOTE — H&P (Signed)
NAMESUMER, MOOREHOUSE NO.:  [id number]   MEDICAL RECORD NO.:  [id number]          PATIENT TYPE:  INP   LOCATION:  NA                           FACILITY:  [facility]   PHYSICIAN:  Luis Abed, MD, FACCDATE OF BIRTH:  08/22/1930   DATE OF ADMISSION:  DATE OF DISCHARGE:                              HISTORY & PHYSICAL   [name] is 77 years old.  She presented to [facility] on May 27  with burning chest pain.  She was catheterized immediately and had a  totally occluded right coronary artery.  This was treated with a bare  metal stent successfully by Dr. Excell Seltzer.  She stabilized and went home on  Nov 24, 2007.  On November 26, 2007, the patient had some chest discomfort.  Her original presentation pain with her MI was chest burning.  Her pain  on November 26, 2007, represented slight chest discomfort but primarily pain  across her shoulders and up her neck.  The history is difficult.  There  is question of a component of discomfort with breathing.  The patient  does have significant lung problems historically and her first  evaluation was earlier today through Dr. Evlyn Courier office.  Dr. Craige Cotta had  felt that the pain was not clearly a pulmonary pain and she then came to  our office.  Here she is not having pain at this time.  However, her  daughter says that she had pain up until this morning.  She does not  have nausea, vomiting or diaphoresis.   PAST MEDICAL HISTORY:  Allergies:  SULFA, PENICILLIN and CODEINE, and  there is question of intolerance to Plavix.  She was sent home on Ticlid  250 mg p.o. b.i.d.   Medications:  At discharge she was on:  1. Aspirin 325 mg.  2. Ticlid 250 mg b.i.d.  3. Pravachol.  4. Imipramine 75 mg daily.  5. Synthroid 112 mcg.  6. Zantac 300 mg.  7. Senna 1 tablet daily.  8. Nitroglycerin.  9. Also on Lantus insulin.   Other medical problems:  See the list below.   SOCIAL HISTORY:  See the H&P dictated for her admission on Nov 20, 2007.   FAMILY HISTORY:  Noncontributory at this point.   REVIEW OF SYSTEMS:  At this point today in the office she is now stable  and has no significant complaints at this time.   PHYSICAL EXAM:  Blood pressure is 114/57.  Pulse is 86.  The patient is oriented to person, time and place.  Affect is normal.  She is here with her daughter.  HEENT:  No xanthelasma.  She has normal extraocular motion.  There are  no carotid bruits.  There is no jugular venous distention.  LUNGS:  Clear.  Respiratory effort is not labored.  CARDIAC:  An S1 with an S2.  There are no clicks or significant murmurs.  ABDOMEN:  Soft.  She has no peripheral edema.   EKG at this time reveals no acute abnormalities.  There are diffuse  nonspecific ST-T wave changes.  Other labs are not available at  this  time.  She is being admitted to the hospital for further observation.   PROBLEMS:  1. History of remote tobacco abuse.  2. Status post appendectomy, hysterectomy and cholecystectomy.  3. Status post thyroidectomy with the patient on Synthroid treatment.  4. History of fibromyalgia.  5. Macular degeneration with legal blindness.  6. Hyperlipidemia with multiple statin intolerances.  7. Pulmonary abnormalities with a history of pulmonary infiltrates,      recent pneumonia, asthma and rhinitis.  8. 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  9. Coronary artery disease, status post recent inferior myocardial      infarction with percutaneous coronary intervention with a bare      metal stent to the right coronary artery.  10.Allergies to SULFA, PENICILLIN, CODEINE, and intolerance of Plavix      and multiple statins.  11.Ticlid therapy for her recent bare metal stent.  12.** Recurring chest discomfort.  The exact etiology at this point is      very difficult to know.  The  patient has multiple reasons for chest      pain.  However, she recently had a myocardial infarction and went      home and has return of discomfort.  Her pain is not exactly the      same, but it is concerning.  There are no diagnostic EKG changes.      The patient will be readmitted for further evaluation.  Her      troponins will be checked and these will have to be correlated with      labs that she had when she was at the hospital.  The decision-      making here will be difficult.  It is possible that a re-look      catheterization may be necessary to help resolve the issue the best      way possible positive.  13.Diabetes.      Luis Abed, MD, [facility]  Electronically Signed     JDK/MEDQ  D:  11/27/2007  T:  11/27/2007  Job:  [id number]   cc:   Brooke Bonito, M.D.  Pricilla Riffle, MD, [facility]

## 2010-11-08 NOTE — Cardiovascular Report (Signed)
NAMEYARETZI, ERNANDEZ NO.:  [id number]   MEDICAL RECORD NO.:  [id number]          PATIENT TYPE:  OIB   LOCATION:  2507                         FACILITY:  [facility]   PHYSICIAN:  Everardo Beals. Juanda Chance, MD, FACCDATE OF BIRTH:  May 13, 1931   DATE OF PROCEDURE:  11/30/2008  DATE OF DISCHARGE:  11/30/2008                            CARDIAC CATHETERIZATION   CLINICAL HISTORY:  Ms. Exline is 77 years old and has had previous of  bare-metal stent placed to the right coronary artery in 2009 and  recently has had increased symptoms of chest pain.  She was studied in  the [facility] lab today by Dr. Gala Romney and he was concerned about significant  left main lesion.  We brought her up stairs for further evaluation with  IVUS.  She has an allergy to PLAVIX with hives and is currently taking  Ticlid.  She does have disease in the circumflex artery as well with a  40-50% ostial stenosis and an 80% distal stenosis.   PROCEDURE:  Procedure was performed via right femoral artery and using  an XB LAD 3.5 guiding catheter with side holes.  The patient was given  heparin to prolong ACT greater than 200 seconds.  We passed a Prowater  wire down the LAD without difficulty.  We then passed an Atlantis IVUS  catheter into the LAD and did automatic pullback to the left main.  The  patient tolerated the procedure well and left laboratory in satisfactory  condition.   RESULTS:  Angiographically, the left main appears to be about 50-60%  narrowed.  There also appears to be about 40-50% narrowing in the ostium  of the circumflex artery.  The IVUS pictures documented minimal lumen  area right near the ostium of 4.6 square millimeters.  The reference  lumen area in the distal portion of the left main was 11.537 mm.  This  gave an area of stenosis of 60%.   CONCLUSION:  Intravascular ultrasound of the left main demonstrating a  significant left main stenosis with a minimal lumen area of 4.6 square  millimeters.   RECOMMENDATIONS:  I think the lesion is significant and I think it is  favorable for PCI.  However, the patient is allergic to PLAVIX and is  currently taking Ticlid and there is uncertainty whether she would be  able to remain on dual antiplatelet therapy for a long time.  I  discussed the situation with Dr. Gala Romney and Dr. Tenny Craw, and we will  consider both surgical and PCI options.      Bruce Elvera Lennox Juanda Chance, MD, [facility]  Electronically Signed     BRB/MEDQ  D:  11/30/2008  T:  12/01/2008  Job:  [id number]   cc:   Pricilla Riffle, MD, [facility]  Bevelyn Buckles. Bensimhon, MD  Cardiopulmonary Lab

## 2010-11-08 NOTE — Discharge Summary (Signed)
[name], [name] NO.:  [id number]   MEDICAL RECORD NO.:  [id number]          PATIENT TYPE:  INP   LOCATION:  2506                         FACILITY:  [facility]   PHYSICIAN:  Bevelyn Buckles. Bensimhon, MDDATE OF BIRTH:  04-Mar-1931   DATE OF ADMISSION:  09/14/2008  DATE OF DISCHARGE:  09/15/2008                               DISCHARGE SUMMARY   PRIMARY CARDIOLOGIST:  Pricilla Riffle, MD, [facility]   DISCHARGE DIAGNOSIS:  Noncardiac back/jaw pain.   SECONDARY DIAGNOSES:  1. Coronary artery disease (status post myocardial injury in May 2009      with complex thrombus in right coronary artery, treated with      cardiac catheterization and femoral stent to right coronary artery,      last cath February 24, 2008, treated with drug-eluting stents to      right coronary artery).  2. Diabetes mellitus (on Lantus).  3. Hyperlipidemia (on Pravachol, intolerant to other STATINS).  4. Status post cerebrovascular accident, 1991.  5. Fibromyalgia.  6. Degenerative joint disease.  7. Constipation.  8. Hypothyroidism.  9. Macular degeneration (legally blind).  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  11.Esophageal motility disorder.  12.Raynaud phenomenon.  13.Gastroesophageal reflux disease.  14.Insomnia.   ALLERGIES/INTOLERANCE:  1. LOPRESSOR.  2. HYDROCHLOROTHIAZIDE.  3. PENICILLIN.  4. SULFA DRUGS.  5. PLAVIX.  6. MULTIPLE STATINS.   PROCEDURES PERFORMED DURING THIS HOSPITALIZATION:  1. The patient had an EKG performed on September 14, 2008, that showed a      sinus rhythm with a rate of 90 bpm, minimally inverted T-waves,      that are new (compared to EKG from September 2009), in V4 through      V6.  No Q-waves, normal axis, no evidence of hypertrophy, PR 152,      QRS 72, QTc 411.  The patient had a chest x-ray that showed      negative for CHF, probable lung nodule on the right, recommended      followup 2-view chest x-ray.  The patient had cardiac      catheterization on September 14, 2008 that showed  2. CAD with patent RCA with 30% lesion proximal to stent, 30% lesion,      in the mid PDA there was a 40% lesion.  3. High-grade lesion in the very small first obtuse marginal.  4. Moderate left main stenosis with catheter dampening, but visually      not worsened to 50%.  5. Normal left ventricular function.  The patient had an EKG on September 15, 2008, with no significant change from prior EKG on September 14, 2008.   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  Last Thursday, September 10, 2008, the patient had brief,  approximately 1 minute duration of upper back  pain with nausea that came  on at rest.  Symptoms resolved and the patient discounted episode.  However, yesterday, September 13, 2008, the patient reports 5/10 upper  middle back pain with nausea that lasted approximately 20 minutes and  resolved after taking 3 nitroglycerin 5 minutes apart. However, nausea  continued throughout the day and the patient also reports feeling  significantly fatigued.  She contacted her cardiologist's office and  reported these symptoms.  They instructed her to report to the emergency  department for evaluation.  Note, the patient also describes worsening  of his chest recently along with jaw pain, experienced intermittently,  including upon evaluation in the ED.  Note, nausea, indigestion, and jaw  pain were experienced prior to her MI in May of 2009.   HOSPITAL COURSE:  The patient was admitted and sent from the emergency  department for diagnostic cardiac catheterization (see results above).  The patient tolerated the procedure well without significant  complications, and after Dr. Gala Romney reviewed films with both  Dr.  Juanda Chance and Dr. Tenny Craw, a plan for medical management was agreed upon.  However, if symptoms persist, possible outpatient Myoview testing may be  completed to rule out ischemia.  The patient's vital signs remained  stable during hospital course and other than 2 episodes of hypoglycemia.  The patient's hospital course was unremarkable.  Note, blood glucose  measured at 40 on September 15, 2008 at 6:51 a.m.  The patient had results  of cardiac catheterization reviewed with her by Dr. Gala Romney and she  received post cath instructions, followup appointments, and medication  list in both oral and written form.  At the time of discharge, she had  no questions or concerns that had not been addressed.   DISCHARGE LABORATORY DATA:  WBC 6.6, HGB 12.4, HCT of 35.1, PLT count  179.  Sodium 137, potassium 3.6, chloride 102, CO2 27, BUN 13,  creatinine 0.7, glucose 155, calcium 8.4, and magnesium 2.  The patient  had one set of point-of-care markers and one full set cardiac enzymes  completed, both were negative.  The patient had a urinalysis completed,  which was within normal limits.   FOLLOWUP PLANS AND APPOINTMENTS:  The patient has a followup appointment  with Dr. Tenny Craw on September 28, 2008, at 11:15 a.m.   DISCHARGE MEDICATIONS:  1. Ticlid 250 mg p.o. b.i.d.  2. Enteric-coated aspirin 325 mg p.o. b.i.d.  3. Synthroid 125 mcg p.o. daily.  4. Nexium 40 mg p.o. daily.  5. Pravachol 20 mg p.o. daily.  6. Imipramine 25 mg p.o. t.i.d.  7. Lantus 8-12 units subcu injection q.a.m.  8. Ambien 5 mg at bedtime for insomnia   Duration of discharge encounter including physician time was 40 minutes.      Jarrett Ables, [facility]      Daniel R. Bensimhon, MD  Electronically Signed    MS/MEDQ  D:  09/15/2008  T:  09/16/2008  Job:  [id number]   cc:   Pricilla Riffle, MD, [facility]

## 2010-11-08 NOTE — Discharge Summary (Signed)
[name], [name] NO.:  [id number]   MEDICAL RECORD NO.:  [id number]          PATIENT TYPE:  INP   LOCATION:  2506                         FACILITY:  [facility]   PHYSICIAN:  Bevelyn Buckles. Bensimhon, MDDATE OF BIRTH:  Jan 19, 1931   DATE OF ADMISSION:  12/04/2008  DATE OF DISCHARGE:  12/05/2008                               DISCHARGE SUMMARY   REASON FOR ADMISSION:  Percutaneous coronary intervention.   DISCHARGE DIAGNOSES:  1. Coronary artery disease.      a.     Status post drug-eluting stent to the left main by Dr.       Juanda Chance this admission.      b.     Status post bare metal stenting to the right coronary artery       May of 2009 in the setting of inferior wall myocardial infarction       - complicated by Dressler syndrome.      c.     Status post drug-eluting stent to the right coronary artery       secondary to in-stent restenosis August 2009.  2. Good left ventricular function.  3. Diabetes mellitus.  4. Dyslipidemia.  5. History of stroke.  6. Scleroderma.  7. Hypothyroidism.  8. Gastroesophageal reflux disease.  9. Anxiety.  10.PLAVIX ALLERGY.  11.Mild normocytic anemia   PROCEDURE PERFORMED THIS ADMISSION:  Cardiac catheterization and  percutaneous coronary intervention Dr. Charlies Constable.  Please see the  dictated note for complete details.  The patient received a XIENCE drug-  eluting stent with IVUS guidance to the left main ostial and mid shaft  lesion, improving stenosis from 80% to 0%.   ADMISSION HISTORY:  [name] is a 77 year old female who initially  presented for cardiac catheterization on November 30, 2008 with Dr. Gala Romney  secondary to shortness of breath and chest discomfort with exertion.  Cardiac catheterization demonstrated three-vessel disease with patent  stents in the RCA, high-grade disease in a very tiny obtuse marginal one  at 90% ostially, 60% ostial circumflex, 60% distal AV groove circumflex  lesion, 40% mid LAD lesion  and 20% diagonal stenosis.  The culprit  lesion seemed to be a 60% - 70% lesion in the left main with a question  of a napkin ring lesion.  This was further studied by Dr. Juanda Chance with  IVUS.  She had a minimal lumen area of 4.6 square millimeters.  Surgery  versus PCI was discussed with the patient and it was eventually decided  to proceed with PCI.  She presented [facility] on December 04, 2008 for the procedure.   HOSPITAL COURSE:  She underwent percutaneous coronary intervention by  Dr. Charlies Constable on December 04, 2008.  As noted above she had a drug-  eluting stent placed to the left main.  She tolerated procedure well and  without any complications.  Due to her PLAVIX ALLERGY she will be  continued on Ticlid for least a year and probably longer.  She will also  remain on aspirin.  On the morning of December 05, 2008 she was noted to be  in stable condition and ready for discharge to home.  She denied any  chest pain.  She did note some dyspnea on exertion but this was much  improved since her stenting.  She has been enrolled in the ADAPT-DES  study.  She was noted to have some brief tachycardia on the monitor.  She is not on a beta blocker secondary to SIGNIFICANT SIDE EFFECTS IN  THE PAST TO BETA BLOCKERS.   LABS AND ANCILLARY DATA:  Hemoglobin 11.5, MCV 88.3, platelet count  157,000, white count 5500, potassium 3.5, glucose 132, BUN 12,  creatinine 0.87.   DISCHARGE MEDICATIONS:  1. Synthroid 0.125 mg daily.  2. Lantus insulin 8 - 12 units daily.  3. Ticlid 250 mg b.i.d.  4. Nexium 40 mg daily.  5. Aspirin 325 mg b.i.d.  6. Imipramine 25 mg three tablets nightly.  7. Triazolam 0.25 mg p.r.n.  8. Pravastatin 20 mg nightly.  9. Amlodipine 5 mg one and a half tablets daily.   ALLERGIES:  PLAVIX, LOPRESSOR, PENICILLIN, SULFA.  SHE IS INTOLERANT TO  ALL STATINS EXCEPT PRAVASTATIN.  SHE IS ALSO ALLERGIC TO  HYDROCHLOROTHIAZIDE.   DIET:  Low-fat, low-sodium, diabetic  diet.   ACTIVITY:  She is to increase her activity slowly.  She may walk up  steps.  She may shower.  No lifting or sexual activity for 1 week, no  driving for 3 days.   WOUND CARE:  She should call our office for any groin swelling,  bleeding, bruising or fever.   FOLLOWUP:  She will see Dr. Tenny Craw in the next 2 weeks in followup.  The  office will contact her with an appointment.   Total physician and PA time greater than 30 minutes on this discharge.      Tereso Newcomer, PA-C      Bevelyn Buckles. Bensimhon, MD  Electronically Signed    SW/MEDQ  D:  12/05/2008  T:  12/05/2008  Job:  [id number]

## 2010-11-08 NOTE — H&P (Signed)
NAMEMARGRET, MOAT NO.:  [id number]   MEDICAL RECORD NO.:  [id number]          PATIENT TYPE:  INP   LOCATION:  2506                         FACILITY:  [facility]   PHYSICIAN:  Pricilla Riffle, MD, FACCDATE OF BIRTH:  [birth year]   DATE OF ADMISSION:  09/14/2008  DATE OF DISCHARGE:                              HISTORY & PHYSICAL   PRIMARY CARDIOLOGIST:  Pricilla Riffle, MD, [facility]   CHIEF COMPLAINT:  Back pain with nausea.   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  Last  Thursday, the patient had brief approximately 1 minute worth of upper  back pain with nausea that came on at rest.  It resolved and the patient  discounted her symptomatology, however, yesterday, on September 13, 2008,  the patient reports 5/10 upper middle back pain with nausea that lasted  approximately 20 minutes and resolved after taking 3 nitroglycerin 5  minutes apart.  However, the nausea continued throughout the day and she  also felt significantly fatigued.  She contacted her cardiologist's  office and reported these symptoms.  They instructed her to report to  the emergency department for eval.  Note, the patient also has described  worsening of indigestion recently along with jaw pain, which have  intermittently continued up until the present time.  No nausea,  indigestion, or jaw pain were experienced just prior to her MI in May  2009.   PAST MEDICAL HISTORY:  1. History of coronary artery disease (S/P MI in May 2009 with complex      thrombus in RCA, treated with cardiac catheterization and femoral      stent to RCA, last cardiac catheterization on February 24, 2008,      treated with drug-eluting stent  to RCA and RCA).  2. Diabetes mellitus (on Lantus).  3. Hyperlipidemia (on Pravachol, intolerance to other statins).  4. S/P CVA in 1991.  5. Fibromyalgia.  6. DJD.  7. Asthma.  8. Hypothyroidism.  9. Macular degeneration (legally blind).  10.Dressler syndrome (in June 2009).  11.Esophageal motility disorder.  12.Raynaud phenomenon  13.GERD.   SOCIAL HISTORY:  The patient lives in [place] alone, however, her son  lives nearby.  She is retired, former Technical sales engineer.  She has a 40-  pack-year smoking history, but it is remote as she quit in 1997.  She  drinks no alcohol.  No illicit drugs.  Takes no herbal medications.  She  has a low-sugar/simple carbohydrate diet.  She does cardiac rehab 3 days  a week, and is currently in the maintenance phase.   FAMILY HISTORY:  Mother deceased at age 23.  No history of coronary  artery disease.  Father deceased at age 53.  Father has history of  coronary artery disease and S/P CVA x3.  She has 1 brother, who  died at  22 from massive MI, and 1 sister who died at age 58 from MI.   REVIEW OF SYSTEMS:  The patient reports a decreased appetite over the  last several months, chronic dyspnea on exertion that has not changed,  otherwise see HPI.  All other systems reviewed and were negative.   ALLERGIES/INTOLERANCE:  1. LOPRESSOR.  2. HYDROCHLOROTHIAZIDE.  3. PENICILLIN  4. SULFA DRUGS.  5. PLAVIX.  6. MULTIPLE STATINS.   MEDICATION LIST:  1. Pravachol 20 mg p.o. daily.  2. Lantus 8-12 units subcutaneous injection q.a.m.  3. Nexium 40 mg p.o. daily.  4. Ticlid 250 mg p.o. b.i.d.  5. Imipramine 25 mg p.o. t.i.d.  6. Synthroid 125 mcg p.o. daily.   PHYSICAL EXAMINATION:  VITAL SIGNS:  Temperature is 98.7 degrees  Fahrenheit, BP 116/50, pulse 91, respiration rate 16, O2 saturation 97%  on room air.  GENERAL:  She is alert and oriented x3, in no apparent distress.  She is  able to move and speak easily without respiratory distress.   HEENT:  Head was normocephalic and atraumatic.  Pupils equal, round, and  reactive to light.  Extraocular muscles were intact.  Nares were patent  without discharge.  Dentition was good.  Oropharynx without erythema or  exudates.  NECK:  Supple without lymphadenopathy.  No thyromegaly.  No JVD.  HEART:  Rate regular with audible S1 and S2.  No clicks, rubs, murmurs,  or gallops.  LUNGS:  Clear to auscultation bilaterally.  SKIN:  No rashes, lesions, or petechiae.  Skin unusually taut.  ABDOMEN:  Soft, nontender, and nondistended.  Normal abdominal bowel  sounds.  No rebound or guarding.  No hepatosplenomegaly.  No pulsations.  EXTREMITIES:  No clubbing, cyanosis, or edema.  2+ pulses bilaterally in  both upper and lower extremities.  MUSCULOSKELETAL:  No joint deformity or effusions.  No spinal or CVA  tenderness.  NEUROLOGIC:  Cranial Nerves II through XII were grossly intact.  Strength was 5/5 in all extremities and axial groups.  Normal sensation  throughout.  Normal cerebellar function.   RADIOLOGY:  The patient had a chest x-ray that was negative for CHF,  possible lung nodule on the right, recommend followup with 2-view chest  x-ray.  Next, last echocardiogram was done in August 2009, which showed  LVEF equal to 55-60%, no left ventricle regional wall motion  abnormality, mild basilar septal hypertrophy, mild increase in pulmonary  pressure.   EKG showed sinus rhythm with a rate of 90 beats per minute.  T-wave  inversions that is new in V4 through V6, although minimally inverted T-  waves in V4 through V6 that are different from prior EKG from September  2009.  No Q-waves, normal axis.  No evidence of hypertrophy.  PR 152,  QRS 72, and QTc 411.   LABORATORIES:  WBC 5.8, HGB 13.4, HCT 40.3, and PLT count 190.  Sodium  135, potassium 4.5, chloride 101, CO2 of 26, BUN 19, creatinine 1.04,  and glucose 194.  D-dimer 0.49.  First set of point-of-care markers  negative.  First full  set of cardiac enzymes negative.  The patient had  urinalysis that was within normal limits.   ASSESSMENT AND PLAN:  This is a 77 year old female, who is well known to  Dr. Dietrich Pates from her Cardiac Clinic.  She has a history of coronary  artery disease, status post inferior myocardial infarction with bare-  metal stent in May 2009 and post complication by Dressler  syndrome in  August 2005, represented without chest pain.  The patient had a PTCA  (cutting balloon of in-stent restenosis and drug-eluting stent placed at  that time).  Dr. Tenny Craw last saw the patient on September 03, 2008, at which  time she was doing fine except for a rash (dermatitis).  Over the last  couple of weeks, she notes worsening of fatigue (has to take the bus to  Cardiac Rehab now) denies chest pain or problem with her exercise  sessions.  Last weekend, she developed new upper middle back pain that  was stabbing, also noted nausea, indigestion, and jaw pain.  These last  3 symptoms have previously been associated with her angina/MI in May  2009.  Currently, she is symptom free.  Exam is unremarkable with  negative troponins.  EKG; more pronounced T-wave inversion.  I discussed  cardiac catheterization with the patient given history and now similar  symptomatology and per my recommendations, ischemic eval seems  appropriate at this time.  The patient agrees with my recommendations  and will proceed to cath today.      Jarrett Ables, [facility]      Pricilla Riffle, MD, [facility]  Electronically Signed    MS/MEDQ  D:  09/14/2008  T:  09/15/2008  Job:  [id number]

## 2010-11-08 NOTE — Discharge Summary (Signed)
[name], [name] NO.:  [id number]   MEDICAL RECORD NO.:  [id number]          PATIENT TYPE:  INP   LOCATION:  3703                         FACILITY:  [facility]   PHYSICIAN:  Pricilla Riffle, MD, FACCDATE OF BIRTH:  1930-12-11   DATE OF ADMISSION:  11/27/2007  DATE OF DISCHARGE:  11/28/2007                         DISCHARGE SUMMARY - REFERRING   DISCHARGE DIAGNOSES:  1. Atypical shoulder and neck discomfort of uncertain etiology.  2. Elevated D-dimer with a negative lower extremity Doppler for DVT,      pulmonary embolus suspicion is low.  3. Recent myocardial infarction with bare-metal stenting to the RCA.  4. Multiple allergies and medication sensitivities listed below.  5. History as listed below.   DISCHARGING PHYSICIAN:  Dr. Tenny Craw.   SUMMARY OF HISTORY:  [name] is a 77 year old female who was  recently admitted to [facility] on Nov 20, 2007, and discharged on Nov 24, 2007, secondary to myocardial infarction.  She underwent bare-metal  stenting to the RCA and was placed on Ticlid due to a Plavix  intolerance.   Since her discharge, on the 2nd she developed chest discomfort across  her shoulders and her neck.  She was a difficult historian and there was  a component associated with breathing.  Dr. Craige Cotta felt the pain was not a  pulmonary discomfort thus she was referred to our office.  At the time  of evaluation, she did not have any discomfort; however, her daughter  stated that she had had discomfort up until earlier this morning.  There  were no associated symptoms.   PAST MEDICAL HISTORY:  Notable for:  1. Remote tobacco use.  2. Appendectomy.  3. Hysterectomy.  4. Cholecystectomy.  5. Hypothyroidism status post thyroidectomy.  6. Fibromyalgia.  7. Macular degeneration with legal blindness.  8. Hyperlipidemia.  9. Pulmonary abnormalities with recent pneumonia.  10.Asthma.  11.Rhinitis.  12.Scleroderma.  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  14.Raynaud phenomenon.  15.Esophageal motility disorder.  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  17.Telangiectasia.  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  19.Coronary artery disease.  20.Diabetes   ALLERGIES:  1. SULFA.  2. PENICILLIN.  3. CODEINE.  4. PLAVIX INTOLERANCE.  5. MULTIPLE STATINS   LABORATORY DATA:  Admission weight was 64.1 kg.  Admission H&H was 10.9  and 32.2, normal indices, platelets 236, WBCs 7.3, PTT 34, PT 13.5, D-  dimer was elevated at 1.31, sodium 137, potassium 4.3, BUN 15,  creatinine 0.96, glucose 123.  AST and ALT were slightly elevated at 42  and 47.  Hemoglobin A1c was elevated 6.5.  CK-MBs relative indices were  negative x3.  Troponins were 0.14, 0.11, and 0.10.  TSH 1.904.  Chest x-  ray on the 3rd did show COPD, stable scarring at the lung bases, left  greater than the right.   HOSPITAL COURSE:  Patient was admitted to [facility] by Dr.  Myrtis Ser.  Overnight, she ruled out for myocardial infarction.  She did not  have any further chest discomfort.  She had not experienced shortness of  breath.  Dr. Tenny Craw, well known to the patient, reassessed her.  Lower  extremity Dopplers were  performed and preliminary findings did not show  any evidence of DVT.  Dr. Tenny Craw after reviewing tests felt that the  patient could be discharged home with outpatient followup.   DISPOSITION:  Patient is discharged home.  She was asked to continue her  home medications as prescribed at discharge on the 31st.  These include:   1. Aspirin 325 mg daily.  2. Ticlid 250 b.i.d.  3. Pravachol 10 mg q.h.s.  4. Imipramine 75 mg daily.  5. Synthroid 112 mcg daily.  6. Zantac 300 mg daily.  7. Senna daily.  8. Nitroglycerin 0.4 as needed.  9. Lantus 8 units q.h.s.  10.Lopressor 25 mg half a tablet b.i.d.   She was asked to follow up as scheduled with Dr. Tenny Craw and Dr. Juleen China and  have blood work as prescribed for her discharge summary on the 31st.  I  will leave a message at the office in regard to these  followups to  assure that they are arranged.  She was asked to bring all medications  to all followup appointments.   DISCHARGE TIME:  35 minutes.      Joellyn Rued, PA-C      Pricilla Riffle, MD, [facility]  Electronically Signed    EW/MEDQ  D:  11/28/2007  T:  11/28/2007  Job:  [id number]   cc:   Brooke Bonito, M.D.

## 2010-11-08 NOTE — Assessment & Plan Note (Signed)
[facility]                            CARDIOLOGY OFFICE NOTE   NAME:[name], [name]                    MRN:          [id number]  DATE:11/08/2006                            DOB:          [birth year]    IDENTIFICATION:  [name] is a 77 year old woman with a history of  CAD (cardiac catheterization January 2006 30% LAD, 70% at most proximal  RCA, Myoview done in January 2006 normal, EF of 70%).   I last saw the patient back in January 2007.  Note she had some chest  pain at the time.  It looked like I recommended a Myoview but it does  not look like she had it done.   Since seen, she notes one episode of pain about a month ago.  She was  watching TV.  It was a right-sided pain that shot across her right back.  She took a nitroglycerin and aspirin and it eased off.  She has not had  any since.   She currently has a sinus infection and shortness of breath with that,  but prior to that denied any shortness of breath.   The patient is on medication for diabetes.  Also has complaints of some  back pain, a disc issue that is being followed by Dr. Channing Mutters.   CURRENT MEDICATIONS:  1. Ocuvite two daily.  2. Zinc 50.  3. Aspirin 325.  4. Imipramine 75.  5. Synthroid 112 mcg daily.  6. Stool softener daily.  7. Zantac 150 b.i.d.  8. Starlix one-and-a-half daily.   PHYSICAL EXAMINATION:  GENERAL:  The patient is in no distress.  VITAL SIGNS:  Blood pressure 119/63, pulse is 82 and regular, weight  141, which is up from 133 in June 2007.  LUNGS:  Clear.  NECK:  JVP is normal, no bruits.  CARDIAC:  Regular rate and rhythm, no murmurs, no S3.  Normal S1 and S2.  ABDOMEN:  Benign.  BACK:  Nontender.  CHEST:  Nontender to palpation on the right side.  EXTREMITIES:  No edema.   A 12-lead EKG:  Normal sinus rhythm at 82 beats per minute.   IMPRESSION:  1. Coronary artery disease as noted above.  Not convinced the      patient's symptom was angina.   Would continue on aspirin.  Note she      is not on a beta blocker.  Would continue still for now, would not      add.  Need to review her chart.  2. Dyslipidemia.  Did not tolerate statins in the past.  Will need to      be in touch with Dr. Ophelia Charter office to see when she has had a lipid      done and will need to readjust with her, given her coronary      disease.   I will set follow up for 6 months, sooner if problems develop.     Pricilla Riffle, MD, [facility]     PVR/MedQ  DD: 11/08/2006  DT: 11/08/2006  Job #: [id number]   cc:   Dr. Chestine Spore

## 2010-11-08 NOTE — Assessment & Plan Note (Signed)
[facility]                         GASTROENTEROLOGY OFFICE NOTE   NAME:[name], [name]                    MRN:          [id number]  DATE:06/11/2007                            DOB:          07-05-1930    [name] is a very complex 77 year old white female who basically,  from a GI standpoint, has multiple abdominal adhesions related to  previous total abdominal hysterectomy and bilateral oophorectomy with  several laparoscopic exams by Dr. Randell Patient.  She has had pelvic/colonic  adhesions with an incomplete colonoscopy a year ago per Dr.Crabtree.  I  saw the patient in June of 2007 and treated her appropriately with Cipro  and metronidazole for acute diverticulitis.   She continues with the chronic functional constipation, gas, and  bloating.  I have reviewed her chart which is somewhat voluminous and  cannot really see any evidence where she has had previous colon polyps,  but the patient is scared to death that she has colon cancer.  She has  also had chronic reflux and chronic dysphagia, and has a known small  Zenker's diverticulum, and a presby esophagus.  Some of her swallowing  difficulty is associated with cervical spondylosis with previous spinal  fusion and implantation of a plate in 4098.  The patient actually,  before endoscopy, went to [facility] and last had an endoscopy in  September 1999 that showed the Zenker's diverticulum.  She currently is  taking Zantac 300 mg a day.  She has chronic functional constipation and  is laxative dependent.  She tried Zelnorm in the past without  improvement.  She has had no malabsorption symptoms, denies steatorrhea,  anorexia or weight loss.  She has a vague, chronic, left lower quadrant  discomfort, but has not had any acute attacks in a while.  She denies  any specific food intolerances.  She specifically denies hepatobiliary  complaints, and she has had a previous cholecystectomy.  She has  had  extraesophageal manifestations of GERD manifested mostly by ENT  problems, and has undergone 24 hour pH probe testing, but I cannot find  the results of this report.   PAST MEDICAL HISTORY:  The patient has coronary artery disease, but has  not required stenting.  She suffers from chronic hyperlipidemia, is  followed by Dr. Riley Kill.  The patient additionally has insulin-dependent  diabetes, and cannot tolerate any oral medications because of her rather  marked SULFA allergy.  She does suffer from chronic recurrent sinusitis.  Other problems have included a CVA in 1990, essential hypertension,  chronic depression, removal of the left lobe of her thyroid in 1991,  vertebral fusions as mentioned above in 2000, hysterectomy in 1973,  oophorectomies in 2002, cholecystectomy in 1965.   MEDICATIONS:  1. Aspirin 325 mg a day.  2. Imipramine 75 mg at bedtime.  3. Synthroid 112 mcg a day.  4. Stool softeners daily.  5. Zantac 300 mg a day.  6. Lantus insulin 8 units a day.  7. Mucinex daily.  8. Nitroglycerin p.r.n.   She has rather marked PENICILLIN and SULFA allergies.   FAMILY HISTORY:  Noncontributory in terms  of gastrointestinal problems.  She does have a brother with diabetes.   SOCIAL HISTORY:  She is divorced and lives alone.  She has a nursing  degree, is currently retired.  Quit smoking in 1999.  She does deny the  use of ethanol, and gives no history of previous ethanol abuse.   REVIEW OF SYSTEMS:  The patient recently has had pneumonia, is being  treated by Dr. Craige Cotta in pulmonary.  I see that he has ordered CT scan of  the chest because of persistent pulmonary infiltrates.  This was  performed on April 25, 2007, and is being evaluated at this time  because of persistent consolidation of the right upper lobe.  Her recent  blood work showed a positive ANA with a centromere pattern of 1:160.  Her ACE level was normal at 35, and liver function tests, renal  function, TSH,  CBC and metabolic profile were otherwise unremarkable  except for a slightly elevated blood sugar of 147 mg percent.   PHYSICAL EXAMINATION:  She is an attractive elderly white female,  appearing younger than her stated age.  She is 5 feet 3 inches and weighs 137 pounds.  Blood pressure is 116/58,  and pulse was 72 and regular.  She did have some periungual telangiectasias, but certainly had no skin  changes or scleroderma.  There was no scleral icterus or spot or  telangiectasias noted.  Her chest to me was generally clear, and she appeared to be in a regular  rhythm without murmurs, gallops, or rubs.  There was no hepatosplenomegaly, abdominal masses or significant  tenderness except to deep palpation, left lower quadrant, without any  definite mass or rebound.  Bowel sounds were normal.  There was no peripheral edema, phlebitis, swollen joints.  Mental status was clear.  Rectal exam was deferred.   ASSESSMENT:  1. Constipation-predominant irritable bowel syndrome worsened by      colonic/pelvic adhesions.  I really, on reviewing her chart, cannot      see where this patient has ever had colonic polyposis.  She does      have diverticulosis and had an episode of diverticulitis a year and      a half ago, but certainly has no symptoms now consistent with acute      diverticulitis.  There is some consideration of possible bacterial      overgrowth syndrome, but the patient had no real improvement in her      gas and bloating while on 10 days of Cipro and metronidazole a year      and a half ago, making this diagnosis unlikely.  2. History of chronic gastroesophageal reflux disease and also known      Zenker's diverticulum.  3. Status post cholecystectomy without hepatobiliary complaints.  4. The patient has a great fear of colon cancer since a friend of hers      recently died of such, and she desires CT colonoscopy.  Most recent      attempt at colonoscopy by Dr. Corinda Gubler was  unsuccessful in June of      2007.  5. Insulin-dependent diabetes.  6. Persistent pulmonary infiltrates of unexplained etiology, consider      collagen vascular disease.  7. Coronary artery disease with symptomatic angina, managed with      nitrates.  8. History of partial thyroidectomy with chronic thyroid dysfunction      and thyroid replacement therapy.  9. History of rather severe hyperlipidemia.  10.History of chronic depression.  11.History of degenerative arthritis with several spine operations.  12.Status post total abdominal hysterectomy and bilateral      oophorectomy, gynecological followup per Dr. Randell Patient.  13.History of rather severe PENICILLIN and SULFA allergies.   RECOMMENDATIONS:  1. I will check celiac panel, although I think it is unlikely that she      has celiac disease.  2. I have gone ahead and scheduled a CT colonoscopy at her request,      and will hopefully have Medicare pay for such since she has had an      incomplete colonoscopy by Dr. Corinda Gubler.  3. Continue all medications as listed above with p.r.n.      gastrointestinal followup as needed.     Vania Rea. Jarold Motto, MD, [facility], FAGA  Electronically Signed    DRP/MedQ  DD: 06/11/2007  DT: 06/11/2007  Job #: [id number]   cc:   Coralyn Helling, MD  Georgianne Fick, M.D.

## 2010-11-08 NOTE — Cardiovascular Report (Signed)
[name], [name] NO.:  [id number]   MEDICAL RECORD NO.:  [id number]          PATIENT TYPE:  OIB   LOCATION:  2507                         FACILITY:  [facility]   PHYSICIAN:  Bevelyn Buckles. Bensimhon, MDDATE OF BIRTH:  07/06/1930   DATE OF PROCEDURE:  DATE OF DISCHARGE:                            CARDIAC CATHETERIZATION   PRIMARY CARDIOLOGIST:  Pricilla Riffle, MD, [facility]   IDENTIFICATION:  [name] is a 77 year old woman with a history of  coronary artery disease.  She is status post previous inferior wall  myocardial infarction in May 2009, treated with bare metal stenting.  This was complicated by Dressler syndrome.  She had chest pain in August  2009 with restenosis of the right coronary stent.  This was then treated  with cutting balloon and Endeavor drug-eluting stent was placed.   More recently, she has been having problems with shortness of breath and  some chest pain.  She was seen by Dr. Hart Rochester and referred back for right  heart catheterization.  This was performed and her right heart  catheterization was normal.  She did previously have borderline left  main lesion.  We repeated her left coronary angiography to further  evaluate this.   PROCEDURES PERFORMED:  1. Right heart catheterization.  2. Selective coronary angiography.  3. Left heart catheterization.  4. Left ventriculogram.  5. Left subclavian angiography.   DESCRIPTION OF PROCEDURE:  A 4-French arterial sheath was placed in the  right femoral artery using modified Seldinger technique.  Standard  catheters including JL-4, 3-DRC, and angled pigtail were used for  procedure.  All catheter exchanges were made over the wire.  No apparent  complications.  Venous sheath was placed in the right femoral vein.  Standard Swan-Ganz catheter was used for right heart cath.  Once again,  no apparent complications.   HEMODYNAMICS:  Right atrium mean of 6, RV of 25/5, PA 27/11 with a mean  of 18.  Pulmonary  capillary wedge pressure mean of 11.  Central aortic  pressure is 130/53 with a mean of 88.  LV pressure 131/19 with a mean of  22.  There is no aortic stenosis on pullback.   There was a 20-mm gradient between central aorta and the left main.   CORONARY ANGIOGRAPHY:  Left main:  We took multiple shots of this.  On  selective imaging, there appeared to be a high-grade ostial lesion.  On  nonselective imaging, this appeared not to be a significant, but there  was some question of a napkin ring lesion in the ostial portion.  There  was a 20-mm gradient between the aorta and the left main.  Estimated the  stenosis at about 60-70%.   LAD was a long vessel coursing to the apex and gave off a very large  proximal first diagonal and the proximal LAD of the takeoff of diagonal  was a 20% lesion.  There was a 40% stenosis in the mid LAD.   Circumflex gave off a tiny OM-1, a small OM-2, and a small OM-3.  There  was a 60% ostial lesion in the circumflex  and a 60% lesion in the distal  AV groove.  There was a 90% ostial lesion and a very tiny OM-1.   Right coronary artery was a large dominant vessel and gave off PDA and 2  posterolaterals.  In the proximal portion of the RCA, there was a 40%  stenosis.  There is a long section of stenting throughout the  midsection.  This was widely patent.  Just after the stent, there was a  30-40% lesion in the distal RCA.  In the ostium of the PDA, there was a  40% lesion.   LEFT VENTRICULOGRAM:  It showed an EF of 65% with no regional wall  motion abnormalities.   ASSESSMENT:  1. Normal right heart pulmonary pressures.  2. Normal left ventricular function.  3. Three-vessel coronary artery disease with question of napkin ring      lesion in the ostial left main with a 20-mm gradient between the      aorta and left main.  4. Patent right coronary artery stent.  5. Patent left internal mammary artery to the chest wall.  There is no      significant left  subclavian stenosis.   Roosvelt Harps:  I will review with Dr. Juanda Chance.  I do think left main lesion is  hemodynamically significant.  I would lean toward CABG.  We will discuss  possible IVUS or further evaluation.      Bevelyn Buckles. Bensimhon, MD  Electronically Signed     DRB/MEDQ  D:  11/30/2008  T:  12/01/2008  Job:  [id number]

## 2010-11-08 NOTE — Assessment & Plan Note (Signed)
[facility]                            CARDIOLOGY OFFICE NOTE   NAME:Javier, KALEIA LONGHI                    MRN:          [id number]  DATE:03/12/2008                            DOB:          February 23, 1931    IDENTIFICATION:  Ms. Briggs is a 78 year old woman.  She is status  post MI in May 2009 with a bare metal stent to the RCA.  Post hospital  course complicated by Dressler syndrome.  I saw the patient back in  August.  She was complaining of recurrent chest pressure.  Echocardiogram showed no recurrent effusion.  I was about to call her to  tell her to come in for cardiac catheterization, but she presented to  the emergency room on that same Monday with chest pain.  She was  admitted and underwent repeat catheterization that showed restenosis in  the area of her stent.  She underwent PTCA/stent (drug-eluting) to the  mid RCA.  She is intolerant Plavix and is now placed on Ticlid.   Since discharge, she has been doing pretty well.  She notes only  occasional sharp chest pains that last seconds.   Her current medicines include Ocuvite b.i.d., imipramine 25 t.i.d.,  Zantac 150, Toprol-XL 25, Lantus as directed, Mucinex 600, Nasonex  p.r.n., Ticlid 250, Pravachol 10, colchicine 0.6 b.i.d., aspirin 325,  Synthroid 125 mcg.   PHYSICAL EXAMINATION:  GENERAL:  On exam, the patient is in no acute  distress.  VITAL SIGNS:  Blood pressure is 118/63, pulse is 93, weight 140.  LUNGS:  Without rales or wheezes.  CARDIAC:  Regular rate and rhythm.  S1 and S2.  No significant murmurs.  ABDOMEN:  Benign.  EXTREMITIES:  No edema.   EKG 12-lead shows normal sinus rhythm 93 beats per minute.  T-wave  inversion in the inferior and lateral leads.  Note, this was present on  old EKGs.   IMPRESSION:  1. Coronary artery disease.  Ms. Uhlir unfortunately developed in-      stent restenosis of her bare metal stent and now has a drug-eluting      stent.  She will be  on Ticlid for at least a year.  We will need      bimonthly CBC platelets.  I would keep her on the same regimen.  2. History of Dressler.  I told her she can begin tapering her      colchicine to once a day for a couple of weeks and then off.  3. Dyslipidemia.  We will need to get fasting lipids when she comes in      again.  For her CBC, she should come fasting.   Otherwise, I will set to see her back in 4 months, sooner if problems  develop.    Pricilla Riffle, MD, [facility]  Electronically Signed   PVR/MedQ  DD: 03/12/2008  DT: 03/13/2008  Job #: [id number]

## 2010-11-08 NOTE — H&P (Signed)
[name], [name]             ACCOUNT NO.:  [id number]   MEDICAL RECORD NO.:  [id number]          PATIENT TYPE:  INP   LOCATION:  2025                         FACILITY:  [facility]   PHYSICIAN:  Darryl D. Prime, MD    DATE OF BIRTH:  04-Jan-1931   DATE OF ADMISSION:  12/13/2007  DATE OF DISCHARGE:                              HISTORY & PHYSICAL   CODE STATUS:  The patient is full code.   Total visit time approximately 63 minutes.   CARDIOLOGIST:  Pricilla Riffle, MD.   PULMONOLOGIST:  Coralyn Helling, MD.   PRIMARY CARE PHYSICIAN:  Dr. Delane Ginger.   The patient was a historian as well as her daughter.  They were good  historians.   CHIEF COMPLAINT:  Chest pain.   HISTORY OF PRESENT ILLNESS:  [name] is a 77 year old female with a  history of coronary artery disease status post myocardial infarction in  Nov 20, 2007.  She went to the emergency to the cath lab. There, she had  an LV ejection fraction of 60% with inferior wall hypokinesis.  No  mitral regurgitation.  She had a 100% mid RCA lesion and a mid RPLS 100%  lesion with thrombus.  She had successful percutaneous intervention with  a bare-metal stent.  She had also a 40% circumflex ostial and a 35% mid  LAD lesions.  The patient had since then after discharge persistent  constant chest pain associated with myalgias.  The patient was admitted  again for chest pain, neck pain, bilateral shoulder pain and  subsequently  underwent workup for possible DVT.  Ultrasound of the  lower extremities were negative.  She was seen by Dr. Tenny Craw in clinic on  December 11, 2007 two days prior to admission with complaints again of chest  pain.  On her history here, she notes chest pain since the discharge  described as a stabbing pain, worse with inspiration and radiates  through her bilateral shoulders associated with shortness of breath.  She has problems taking deep breaths due to the pain.  She was  prescribed ibuprofen for it and has started  taking it recently with no  improvement per patient, although she has been taking it rarely.  The  patient notes low-grade fevers as well and had a temperature 100.7 last  night.  She notes the pain got significantly worse last night and has  been a constant near 5-8/10 pain.  She has had decreased appetite,  myalgias and nausea.  She denies any black stools or bloody stools.  The  patient in the emergency room was given morphine, a GI cocktail and  hydromorphone.   PAST MEDICAL/SURGICAL HISTORY:  1. Positive for CREST syndrome.  2. She also has a history of diabetes on insulin.  3. She also has a history of coronary artery disease as above.  4. History of hypothyroidism.  5. History of fibromyalgia.  6. History of macular degeneration bilaterally and she is legally      blind.  7. She has a history asthma.  8. History of anterior cervical degenerative joint disease stenosis  with cervicectomy on  C4-C5, C5-C6 and C6-C7 in 1997.  1. She is also status post decompression of the cervical spine C3-C4      in April 2004.  2. History of pneumonia.  3. She is status post cholecystectomy, hysterectomy and appendectomy.  4. She is status post thyroidectomy.  5. She has a history of hyperlipidemia.   ALLERGIES:  1. She is intolerant to PLAVIX.  2. She is also allergic to SULFA DRUGS and PENICILLIN.   MEDICATIONS:  1. Asmanex 220 2 puffs at night in the p.m.  2. Synthroid 112 mcg daily.  3. Imipramine 25 mg 3 times a day.  4. Lantus subcu. 8 units in the morning before breakfast.  5. Aspirin 325 mg daily.  6. Pravachol 10 mg p.o. q.h.s.  7. Nasonex 50 mg daily.  8. Toprol XL 25 mg daily.  9. Ticlid 250 mg twice a day.  10.Ocuvite twice a day.  11.Zantac 150 mg daily.  12.Mucinex 600 mg daily.   SOCIAL HISTORY:  She has a history of no alcohol or illicit drug use.  She did smoke in the past, but discontinued in 1999.  The patient's  smoking history was a 40 pack year in the  past.   FAMILY HISTORY:  Positive for a sister with COPD and coronary artery  disease.  She has a brother with lung cancer and coronary artery  disease.  Father died of a stroke.   REVIEW OF SYSTEMS:  A 14-point review of systems negative unless stated  above.   PHYSICAL EXAMINATION:  VITAL SIGNS:  Temperature is 99.2, respiratory  rate of 14, pulse of 93, blood pressure 127/47 with sats 97% on 2 liters  nasal cannula.  GENERAL:  She is a chronically ill-appearing female sitting upright in  mild distress due to pain.  HEENT:  Normocephalic, atraumatic.  Pupils equal, round and reactive to  light with extraocular movements being intact.  The oropharynx is dry.  NECK:  Supple with no lymphadenopathy or thyromegaly.  No carotid  bruits.  Jugular venous distention is mildly elevated to 60-70 cm.  CARDIOVASCULAR:  Regular rhythm and rate with no murmurs, rubs or  gallops.  Normal S1-S2.  No S3 or S4.  LUNGS:  Decreased breath sounds at the bases and mild inspiratory  crackles midlung field.  No wheezing.  ABDOMEN:  Soft, nontender, nondistended with normoactive bowel sounds.  EXTREMITIES:  Show no clubbing, cyanosis or edema.  NEUROLOGIC:  She is alert and oriented x4.  Cranial nerves II-XII are  grossly intact.  Strength and sensation grossly intact.  When the  patient was active, she did have significant pain.   DIAGNOSTICS:  1. She had an EKG showing normal sinus rhythm with a rate of 91 beats      per minute with normal axis, normal intervals.  PR interval was      150, QRS 71, QT corrected 410.  She did have inferior T-wave      inversions which are unchanged compared to EKG Nov 23, 2007.  2. CT of the chest showed diffuse pericardial thickening and/or      effusion with new subcarinal lymphadenopathy 1.3 x 2.2 cm, diffuse,      hazy, soft, infiltration in the mediastinum.  She had mild pleural      thickening in both lung bases.  She had a patulous esophagus that      was  dilated and she had evidence of mild bilateral pulmonary edema.  3. Chest x-ray showed cardiomegaly with pulmonary vascular congestion      and mild  perivascular edema.   ASSESSMENT/PLAN:  This is a patient with a history of recent myocardial  infarction who is most likely having pericarditis.  There is also a  concern for possible mediastinitis or pleuritis, this is most likely  from Dressler syndrome, but acute coronary syndrome must be ruled out.  The CT of the chest did not show any evidence of dissection or pulmonary  embolus.  At this time, she will be admitted to be started with Toradol  IV and morphine for pain control as her symptoms are significant.  We  will also start colchicine and ibuprofen.  We will check a TSH.  We will  also get cardiac markers x3.  She will get serial EKGs and will be on  telemetry to rule out acute coronary syndrome.  She will remain on  Ticlid, aspirin, Toprol and Pravachol.  For her possible pulmonary  edema, we will check a BNP.  We will check strict ins/outs and daily  weights.  She will be on a 2 gm sodium in addition to her diabetic ADA  diet.  GI prophylaxis will be with Zantac.  DVT prophylaxis with  pneumatic compression devices.      Darryl D. Prime, MD  Electronically Signed     DDP/MEDQ  D:  12/14/2007  T:  12/14/2007  Job:  [id number]

## 2010-11-08 NOTE — Discharge Summary (Signed)
NAMERAKEYA, GLAB NO.:  [id number]   MEDICAL RECORD NO.:  [id number]          PATIENT TYPE:  INP   LOCATION:  6522                         FACILITY:  [facility]   PHYSICIAN:  Verne Carrow, MDDATE OF BIRTH:  1931/03/10   DATE OF ADMISSION:  02/24/2008  DATE OF DISCHARGE:  02/25/2008                               DISCHARGE SUMMARY   PROCEDURES:  1. Cardiac catheterization.  2. Coronary arteriogram.  3. Left ventriculogram.  4. Percutaneous transluminal coronary angioplasty and drug-eluting      stent to the mid right coronary artery.   PRIMARY FINAL DISCHARGE DIAGNOSIS:  Unstable anginal pain.   SECONDARY DIAGNOSES:  1. Status post myocardial infarction in May 2009 with complex thrombus      in the right coronary artery treated with percutaneous transluminal      coronary angioplasty and bare-metal stents, residual coronary      artery disease treated medically, ejection fraction 60%.  2. Calcinosis cutis, Raynaud phenomenon, esophageal motility disorder,      sclerodactyly, and telangiectasia syndrome.  3. Diabetes.  4. Hypothyroidism.  5. Fibromyalgia.  6. Macular degeneration with the patient being legally blind.  7. Asthma.  8. Degenerative joint disease.  9. Hyperlipidemia.  10.Dressler syndrome, June 2009.  11.Cerebrovascular accident in 1991.  12.Allergy or intolerance to LOPRESSOR, HYDROCHLOROTHIAZIDE,      PENICILLIN, SULFA, PLAVIX, and MULTIPLE STATINS.   TIME AT DISCHARGE:  36 minutes.   HOSPITAL COURSE:  Ms. Rojero is a 77 year old female with a history of  coronary artery disease.  She had chest pain on the day of admission,  relieved by nitroglycerin which had started at rest.  She came to the  emergency room and was admitted for further evaluation and treatment.   Ms. Mcmackin was cathed on February 24, 2008 and it showed a 70-80% in-  stent restenosis in the RCA stent.  This was treated with PTCA and a  drug-eluting stent which  reduced the stenosis to zero.  She is Plavix  intolerant and had in-stent restenosis 3 months from the initial stent.  She is to be on Ticlid for a lifetime.  She is also to continue aspirin,  statin, and a beta-blocker.   On February 25, 2008, Ms. Gibby was seen by cardiac rehab.  Her  postprocedure labs were within normal limits except for hyponatremia  with a sodium of 134 and mild anemia with hemoglobin of 11.8 and  hematocrit of 35.3.  Her platelet count had gone from 208,000 to  168,000.  A recheck is pending.  If her platelet count remains stable  and does not drop further, she is tentatively considered stable for  discharge on February 25, 2008.   DISCHARGE INSTRUCTIONS:  1. Her activity level has been increased gradually.  2. She is to do no lifting for 2 weeks.  3. She is to call our office for any problems with the cath site.  4. She is to stick to a diabetic diet.  5. She is to follow up with Dr. Tenny Craw on March 12, 2008 at 10:15      and with  Dr. Juleen China as needed.   DISCHARGE MEDICATIONS:  1. Synthroid 125 mcg daily.  2. Nexium 20 mg a day.  3. Imipramine 25 mg 3 tablets at bedtime.  4. Lantus insulin sliding scale as at home in a.m.  5. Ticlid 750 mg b.i.d.  6. Toprol-XL 25 mg a day.  7. Colchicine 0.6 mg b.i.d., discuss duration of his medication with      Dr. Tenny Craw in September.  8. Aspirin 325 mg 1-2 tablets daily as at home.  9. Halcion 0.25 mg at bedtime.  10.Ocuvite b.i.d.  11.Pravachol 10 mg a day.  12.Senokot daily p.r.n.  13.Nitroglycerin sublingual p.r.n.      Theodore Demark, PA-C      Verne Carrow, MD  Electronically Signed    RB/MEDQ  D:  02/25/2008  T:  02/26/2008  Job:  [id number]   cc:   Brooke Bonito, M.D.

## 2010-11-09 ENCOUNTER — Telehealth: Payer: Self-pay | Admitting: Pulmonary Disease

## 2010-11-09 NOTE — Telephone Encounter (Signed)
Please advise her that this is okay to try.  She should call back if her symptoms get worse.

## 2010-11-09 NOTE — Telephone Encounter (Signed)
Spoke w/ pt and she states she has been alternating her prednisone doing 5 mg one day and alternating 0.25 mg on the other days. Pt states she seems to be doing fine and has not had any problems w/ her breathing. Pt is wanting to know if Dr. Craige Cotta thinks she should start doing 2 days of 0.25 then doing 0.5 mg a day. Pt aware VS is out of the office until Friday. Please advise Dr. Craige Cotta. Thanks  Carver Fila, CMA

## 2010-11-09 NOTE — Telephone Encounter (Signed)
Spoke w/ pt and she is aware she can do 2 days straight of 0.25 mg and see how she does but to call back if her symptoms worsen. Pt verbalized understanding

## 2010-11-11 NOTE — Discharge Summary (Signed)
NAMEMarland Kitchen  FEVEN, ALDERFER                       ACCOUNT NO.:  [id number]   MEDICAL RECORD NO.:  [id number]                   PATIENT TYPE:  IPS   LOCATION:  4008                                 FACILITY:  [facility]   PHYSICIAN:  Ellwood Dense, M.D.                DATE OF BIRTH:  [birth year]   DATE OF ADMISSION:  DATE OF DISCHARGE:  10/27/2002                                 DISCHARGE SUMMARY   DISCHARGE DIAGNOSES:  1. Cervical decompression and fusion.  2. Escherichia urinary tract infection.  3. Insomnia.  4. Postoperative anemia.   HISTORY OF PRESENT ILLNESS:  The patient is a 77 year old female with a  history of diabetes mellitus, fibromyalgia, severe stenosis C3 to C4 with  quad compression and myelopathy. She elected to undergo L3-L4 posterior  decompression and fusion on October 15, 2002, by Dr. Turner Daniels. Postoperatively  she has had some problems with right shoulder pain. Physical therapy was  initiated and the patient is close supervision with transfers, min assist  ambulating 80 feet with a rolling walker.   PAST MEDICAL HISTORY:  1. CVA with mild right-sided weakness.  2. Cervical fusion C4-C7 with removal of plates.  3. Bladder tack.  4. Hysterectomy and oophorectomy.  5. Partial thyroidectomy.  6. Cholecystectomy.  7. Esophageal perforation.  8. Past ACDS with repair.  9. Depression.  10.      Macular degeneration.  11.      GERD.   ALLERGIES:  SULFA AND PENICILLIN.   SOCIAL HISTORY:  The patient lives with family in a 1 level home with no  steps at entry. She was independent  prior to  admission. She does not use  any alcohol. She quit tobacco in 1997.   HOSPITAL COURSE:  The patient was admitted to rehabilitation on October 21, 2002, for inpatient therapies to consist of physical therapy and  occupational therapy daily. Past admission the patient was continued with a  hard collar at all times except for showering. Her incision has is healing  well  without  any  signs or symptoms  of infection, drainage or erythema  noted. She was noted to have some fluctuance at the superior aspect of the  incision, probably secondary to resolving hematoma.   Labs done past admission showed hemoglobin 10.5, hematocrit 20, white count  10.7, platelets 325. Sodium 131, potassium 3.9, chloride 133, CO2 21, BUN 9,  creatinine 0.8, glucose 127. SGOT 59, SGPT 110. Total bilirubin 0.7. A  urinalysis and urine culture was sent out and grew greater than 100,000  colonies of E. coli and the patient was treated with a 7 day course of  antibiotics for this.   The patient's pain control has been reasonable. Therapies were initiated and  the patient has progressed to being modified independent for ADLs including  simple home management tasks. She was independent for ambulating 200 plus  feet. Further followup therapies are to  include home health, physical  therapy and occupational therapy past discharge. On Oct 27, 2002, the patient  is discharged to home.   DISCHARGE MEDICATIONS:  1. Lexapro 10 mg per day.  2. Klor-Con 75 mg p.o. q.h.s.  3. Levothyroxine ___ mcg per day.  4. Protonix 40 mg a day.  5. Senokot S q.h.s.  6. Oxycodone IR 5 to 10 mg q.4-6h. p.r.n. pain.  7. Skelaxin 400 mg q.i.d. p.r.n. spasms.  8. Cipro 1 p.o. b.i.d. for 1 additional day.   DISCHARGE INSTRUCTIONS:  Activity, may shower without collar. Otherwise has  to have collar on at all times. Diet is diabetic. Wound care, keep the area  clean and dry. Special instructions, no alcohol, no smoking, no driving. May  use phospho soda additionally for constipation.   FOLLOW UP:  The patient is to follow up with Dr. Turner Daniels and Dr. Juleen China in 3 to  4 weeks. Followup with Dr. Lamar Benes as needed.     Greg Cutter, P.A.                    Ellwood Dense, M.D.    PP/MEDQ  D:  10/27/2002  T:  10/28/2002  Job:  [id number]   cc:   Brooke Bonito, M.D.  [address]  [place]  [place] [address]   Fax: [phone]   Feliberto Gottron. Turner Daniels, M.D.  [address]  [place]  [place] [address]  Fax: [phone]

## 2010-11-11 NOTE — Discharge Summary (Signed)
[facility]  Patient:    [name], [name]                    MRN: [id number] Adm. Date:  [id number] Disc. Date: [id number] Attending:  Collene Schlichter                           Discharge Summary  HISTORY OF PRESENT ILLNESS:  The patient is a 77 year old with chronic pelvic pain and cystic structures on sequential ultrasounds who was admitted on February 20 for exploratory laparotomy after laparoscopy for treatment of this problem. The remainder of her history and physical is as previously dictated.  LABORATORY DATA:  Normal chemistries except for a glucose somewhat elevated at 234. Urinalysis was within normal limits. Hemoglobin 14.3, 5800 white blood cells. Chest x-ray showed mild degenerative changes of the dorsal spine but no other active disease. EKG was normal except for nonspecific T wave abnormalities.  HOSPITAL COURSE:  The patient was taken to the operating room on February 20 at which time laparoscopy with lysis of adhesions and exploratory laparotomy with extensive lysis of adhesions and BSO were performed. The patient did well postoperatively. Diet and ambulation were progressed over the evening of February 20 and the early morning of February 21. On the morning of February 21, she was afebrile, voiding without difficulty and basically without problems. It was felt that she could be discharged at this time.  FINAL DIAGNOSES:  Pelvic pain, dense pelvic adhesions.  OPERATION PERFORMED:  Laparoscopy with lysis of adhesions, exploratory laparotomy with bilateral salpingo-oophorectomy, and extensive lysis of adhesions. Pathology report is unavailable at the time of dictation.  DISPOSITION:  Discharged to home to return to the office in two weeks for follow-up. She was instructed to gradually progress her activities over several weeks at home and to limit lifting and driving for two weeks. She was fully ambulatory, on a regular diet, and in  good condition at the time of discharge. She was given a prescription for Tylox or generic #30 to be taken one or two q. 4h p.r.n. pain and Keflex 250 mg or generic #40 to be taken ______ 2 q.i.d. x 48 hours and then 1 q.i.d. She was advised also to use ibuprofen 400 mg t.i.d. for a week and to call for any problems. DD:  08/16/00 TD:  08/17/00 Job: [id number]

## 2010-11-11 NOTE — Cardiovascular Report (Signed)
NAME:  [name], [name]                       ACCOUNT NO.:  [id number]   MEDICAL RECORD NO.:  [id number]                   PATIENT TYPE:  OIB   LOCATION:  4734                                 FACILITY:  [facility]   PHYSICIAN:  Arturo Morton. Riley Kill, M.D.             DATE OF BIRTH:  10-23-1930   DATE OF PROCEDURE:  DATE OF DISCHARGE:  07/15/2003                              CARDIAC CATHETERIZATION   INDICATIONS:  [name] is a delightful 77 year old woman who presents  with some shortness of breath.  The shortness of breath has been  predominantly associated with exertion.  She has subsequently been referred  for right and left heart catheterization.   The procedure and its risks were discussed with the patient prior to its  performance.   PROCEDURES:  1. Right and left-heart catheterization.  2. Selective left ventriculography.  3. Selective coronary arteriography.   DESCRIPTION OF PROCEDURE:  The patient was brought to the catheterization  laboratory and prepped and draped in the usual fashion.  Through an anterior  puncture, the femoral vein was entered.  Right heart catheterization was  then performed using an 8-French sheath and a 7.5 French thermodilution Swan  Ganz catheter.  With this, saturations were obtained in the superior vena  cava and pulmonary artery.  Thermodilution cardiac outputs were performed.  Following this, the right femoral vein was entered.  There was mild damping  in the left main coronary artery, probably due to some spasm.  Right  coronary arteriography was then performed.  Intraaortic nitroglycerin was  administered.  We ultimately used a guiding catheter to intubate the left  main.  The patient tolerated the procedure well, and there were no  complications.   Central aortography was also performed.   HEMODYNAMIC DATA:  1. Right atrium 12.  2. Right ventricle 35/14.  3. Pulmonary artery 35/18.  4. Pulmonary capillary wedge 16.  5. Aortic 124/61,  mean 88.  6. Left ventricle 127/15.  7. No gradient on pullback across the aortic valve.  8. Thermodilution cardiac output 5.0 liters per minute.  9. Thermodilution cardiac index 3.1 liters per minute, per meter squared.  10.      Fick cardiac output 3.7 liters per minute.  11.      Fick cardiac index 2.3 liters per minute, per meter squared.  12.      Aortic saturation 94%.  13.      Superior vena cava saturation 69%.  14.      Pulmonary artery saturation 70%.   ANGIOGRAPHIC DATA:  1. Ventriculography was performed in the RAO projection.  Overall left     ventricular size was normal.  Systolic function was vigorous.  Because of     ventricular ectopy, ejection fraction could not be calculated, but was     clearly in excess of 60%.  No segmental wall motion abnormalities were     identified.  2. Proximal root aortography  was performed.  This revealed a normal-sized     aortic root; aortic leaflet motion was normal.  No significant aortic     regurgitation was noted.  There did not appear to be any evidence of     aortic dissection.  3. The left main was initially difficult to intubate, probably related at     least in part to some catheter-induced spasm.  However, there did not     appear to be narrowing in excess of about 30% after multiple sub     selective views were obtained.  Mild, eccentric plaquing at the origin,     however, could not be excluded.  It did not appear to be critical,     however.  4. The left anterior descending artery divided into a LAD and a diagonal     branch fairly proximally.  The LAD appeared to have about 40% ostial     narrowing just after the takeoff of the diagonal.  There did appear to be     some bifurcational abnormality.  The mid LAD also demonstrates about 30-     40% mid narrowing, but this did not appear to be high grade.  5. The circumflex consists of predominantly one major marginal branch.     Importantly, the circumflex proper has what  appears to be about 60%     ostial narrowing.  This is right at the ostium.  There does not appear to     be critical narrowing beyond this point.  There is a small marginal     branch which is fairly small in caliber, and there is perhaps very mild     distal luminal irregularity.  6. The right coronary artery is a dominant vessel. It provides a posterior     descending branch and two posterolateral branches.  In the mid portion of     the right coronary artery is an eccentric, slightly hypodense 60-70% area     of focal abnormality followed by a 30-40% area of narrowing, both in the     mid vessel.  The proximal area was seen in multiple views, and did not     appear to exceed 70% in luminal narrowing.  The distal vessel and the     posterior descending and posterolateral appear free of critical disease.   IMPRESSION:  1. Mild pulmonary hypertension of uncertain etiology.  2. Moderate coronary artery disease as defined in the above text.   PLAN:  We plan to see the patient in followup in the office.  Importantly,  CT scanning should be done to exclude underlying chronic pulmonary emboli.  PFT's may be warranted because of the patient's shortness of breath, and  plus/minus Cardiolite imaging.  A consideration could be given to dilatation  and stenting of the right coronary artery as a potential source of symptoms,  but the patient would need to be followed on a serial basis to see if this  in fact is the source of the patient's symptoms.  We plan to see the patient  back in the office in one to two days to review the  findings, and to set up a plan for further evaluation prior to consideration  of a balloon dilatation of the right coronary artery which may or may not be  responsible for the symptoms.  Also, eventually, Cardiolite imaging might be  important to further evaluate this patient.  Arturo Morton. Riley Kill, M.D.    TDS/MEDQ  D:   07/25/2003  T:  07/26/2003  Job:  [id number]   cc:   Pricilla Riffle, M.D.   Brooke Bonito, M.D.  [address]  [place]  [place] [address]  Fax: [phone]   [facility]

## 2010-11-11 NOTE — Discharge Summary (Signed)
NAME:  HILDAGARDE, HOLLERAN                       ACCOUNT NO.:  [id number]   MEDICAL RECORD NO.:  [id number]                   PATIENT TYPE:  OIB   LOCATION:  4734                                 FACILITY:  [facility]   PHYSICIAN:  Arturo Morton. Riley Kill, M.D.             DATE OF BIRTH:  1930-10-05   DATE OF ADMISSION:  07/14/2003  DATE OF DISCHARGE:  07/15/2003                                 DISCHARGE SUMMARY   PROCEDURES:  1. Cardiac catheterization.  2. Coronary arteriogram.  3. Left ventriculogram.  4. Right heart catheterization.  5. CT of the chest.   HOSPITAL COURSE:  Ms. Stemmer is a 77 year old female with a history of  dyslipidemia and borderline diabetes.  She also has approximately a 45 pack-  year history of tobacco and family history of coronary artery disease.  She  has a long history of dyspnea on exertion and had an abnormal stress test.  Cardiac catheterization was indicated and she was admitted for this on  July 14, 2003.   The right and left heart catheterization showed a wedge of 16 with PA  pressures of 35/18.  She had nonobstructive coronary artery disease in the  left main, diagonal and RCA.  The RCA lesion was felt the tightest at 60-  70%.  It was felt that she needed a CT scan to rule out pulmonary causes of  shortness of breath and this was ordered.   The CT of the chest showed no PE.  Further details on the reading are not  available at this time.  Her CBG was elevated at 179, which is a fasting  level so a hemoglobin A1c was performed, but the results are not available  at the time of dictation.   Ms. Guggenheim had some sneezing as well as watery eyes and clear nasal  drainage and felt that she was having an allergic response to something but  there was no shortness of breath or edema noted.  She states that she has  these problems occasionally and takes antihistamines.  She had been taken  off Allegra-D secondary to hypertension and she was given Allegra  180 mg  q.d. temporarily.  She is to follow up with Dr. Juleen China.   Ms. Terrones was otherwise considered stable for discharge on July 15, 2003, and is to follow up on July 16, 2003, with Dr. Riley Kill, and  schedule an appointment with Dr. Juleen China.   DISCHARGE DIAGNOSES:  1. Shortness of breath/dyspnea on exertion, follow up with Dr. Juleen China and     with Dr. Riley Kill as an outpatient.  2. Hyperglycemia, hemoglobin A1c pending at the time of dictation.  3. Moderate coronary artery disease with normal left ventricular function by     catheterization this admission, follow-up with Dr. Riley Kill.  4. Dyslipidemia with elevated triglycerides and a low high-density     lipoprotein.  5. Degenerative joint disease, status post neck surgery.  6.  Status post cholecystectomy, hysterectomy, left lobar thyroidectomy, and     cataract surgery.  7. Macular degeneration.  8. ALLERGIES TO PENICILLIN AND SULFA.   DISCHARGE INSTRUCTIONS:  1. Her activity level is to include no strenuous activity for 2 days.  2. She is to call the office for problems with the cath site.  3. She is to follow up with Dr. Riley Kill on July 16, 2003, at 3:15.  4. She is to call Dr. Juleen China for an appointment.   DISCHARGE MEDICATIONS:  1. Imipramine 25 mg 3 tablets q.h.s.  2. Lexapro 10 mg q.d.  3. Synthroid 0.1 mg q.d.  4. Prevacid 30 mg q.d.  5. Halcion 10 mg q.h.s. p.r.n.  6. Allegra 80, 180 mg p.o. q.d. p.r.n.      Theodore Demark, P.A. [facility]                  Thomas D. Riley Kill, M.D.    RB/MEDQ  D:  07/15/2003  T:  07/16/2003  Job:  [id number]   cc:   Brooke Bonito, M.D.  [address]  [place]  [place] [address]  Fax: [phone]   Payton Doughty, M.D.  [address]  [place] [address]  Fax: [phone]   Pricilla Riffle, M.D.

## 2010-11-11 NOTE — Op Note (Signed)
NAME:  ELIENAI, GAILEY                       ACCOUNT NO.:  [id number]   MEDICAL RECORD NO.:  [id number]                   PATIENT TYPE:  INP   LOCATION:  3172                                 FACILITY:  [facility]   PHYSICIAN:  Payton Doughty, M.D.                   DATE OF BIRTH:  11/19/1930   DATE OF PROCEDURE:  10/15/2002  DATE OF DISCHARGE:                                 OPERATIVE REPORT   PREOPERATIVE DIAGNOSIS:  Severe cervical spondylitic myelopathy at C3-4.   POSTOPERATIVE DIAGNOSIS:  Severe cervical spondylitic myelopathy at C3-4.   OPERATION PERFORMED:  C3-4 posterior cervical posterior decompression and  fusion.   SURGEON:  Payton Doughty, M.D.   ASSISTANT:  1. Hewitt Shorts, M.D.  2. Harriman.   ANESTHESIA:  General endotracheal.   PREP:  Sterile Betadine prep and scrub with alcohol wipe.   COMPLICATIONS:  None.   INDICATIONS FOR PROCEDURE:  The patient is a 77 year old lady with severe  cervical myelopathy at C3-4.   DESCRIPTION OF PROCEDURE:  The patient was taken to the operating room,  smoothly anesthetized, intubated and placed  prone in the [place] head  holder.  Following shave, prep and drape in the usual sterile fashion, the  skin was infiltrated with 1% lidocaine with 1:400,000 epinephrine and skin  was incised from C2 through  C6.  The lamina of C3 and C4 were exposed  bilaterally in the subperiosteal plane out over the 3-4 facet joints.  Intraoperative x-ray confirmed correctness of the level.  Lateral mass  screws were then placed at C3 and C4 bilaterally.  The facet joint itself  had the cartilage removed and the bone decorticated.  The lateral masses  were also decorticated with the high speed drill.  The connecting rod was  placed between the screws and locking caps tightened.  Decompressive  laminectomy of  L3 and L4 was then carried out.  The canal itself was  obviously quite compromised posteriorly.  Every effort was made to avoid any  compression of the thecal sac whatsoever and this was accomplished to good  end.  The wound was irrigated, hemostasis assured.  DVX and autologous bone  graft was then placed laterally over the decorticated lateral masses and  compressed into the facet joint.  Intraoperative x-ray showed good placement  of the screws.  The fascia was reapproximated with 0 Vicryl in interrupted  fashion.  Subcutaneous tissues were reapproximated with 0 Vicryl in  interrupted fashion.  Subcuticular tissues were reapproximated with 3-0  Vicryl in interrupted fashion.  The skin was closed with 3-0 nylon in a  running locked fashion.  Betadine Telfa dressing was applied and made  occlusive with Op-Site and the patient returned to the recovery room in good  condition.  Payton Doughty, M.D.   MWR/MEDQ  D:  10/15/2002  T:  10/15/2002  Job:  [id number]

## 2010-11-11 NOTE — H&P (Signed)
NAME:  [name], [name]                       ACCOUNT NO.:  [id number]   MEDICAL RECORD NO.:  [id number]                   PATIENT TYPE:  INP   LOCATION:  3172                                 FACILITY:  [facility]   PHYSICIAN:  Payton Doughty, M.D.                   DATE OF BIRTH:  1931/03/01   DATE OF ADMISSION:  10/15/2002  DATE OF DISCHARGE:                                HISTORY & PHYSICAL   ADMISSION DIAGNOSIS:  Cervical spondylitic myelopathy at C3-4.   SERVICE:  Neurosurgery.   HISTORY OF PRESENT ILLNESS:  This is a very nice 77 year old right-handed  white lady who has undergone an anterior cervicectomy and fusion in 1997 at  C4-5, C5-6 and C6-7.  She developed an esophageal perforation, was managed  conservatively with antibiotics to allow bone graft to heal and the plate  was removed.  She subsequently underwent repair of her esophagus and is  currently stable from that standpoint.  She has developed progressive pain  in her extremities and in her neck, difficulty with her gait.  She is now  admitted for an MRI which demonstrated severe stenosis at C3-4.  Because of  the scarring anteriorly on her neck, this is not approachable from an  anterior direction, so she is admitted for posterior decompression and  stabilization at 3-4.   PAST MEDICAL HISTORY:  This is also remarkable for macular degeneration and  she is legally blind from this.   CURRENT MEDICATIONS:  1. Lexapro 10 mg a day.  2. Synthroid 0.112 mg a day.  3. Prevacid 40 mg a day.  4. Imipramine 75 mg at night.  5. Ocu-Vite.  6. Multivitamin.  7. Darvocet on p.r.n. basis.  8. Alprazolam 0.5 mg at night.   HABITS:  She does not smoke and does not drink.   SOCIAL HISTORY:  She lives alone with some assistance.   FAMILY HISTORY:  This is noncontributory to her problem.   REVIEW OF SYSTEMS:  This is remarkable for neck pain.  There is spasticity  in the upper extremities.  She also has weakness in her hands  and stumbling  gait.   PHYSICAL EXAMINATION:  HEENT:  Within normal limits save for degeneration of  retina bilaterally.  NECK:  There is a well-healed incision and her airway is stable.  CHEST:  Clear.  CARDIOVASCULAR:  Regular rate and rhythm.  NEUROLOGIC:  She is awake, alert and oriented.  Cranial nerves are intact.  Motor examination shows 5/5 strength throughout the proximal upper  extremities and in the grips and interossei she is about 5-/5.  She has a  positive Hoffman's bilaterally.  Lower extremity strength is 4 and knee  jerks are 3+.  There is no clonus at the ankles but ankle jerks are about 4.  Toes are upgoing bilaterally.   Her MRI results have been reviewed above.  Basically she has severe cervical  spinal stenosis.   IMPRESSION AND PLAN:  Cervical spondylosis with myelopathy.  This is not  approachable anteriorly, therefore she will have a fusion at C3-C4  posteriorly and a 3-4 decompression.  The risks and benefits of this  approach have been discussed with her and she wished to proceed.                                               Payton Doughty, M.D.    MWR/MEDQ  D:  10/15/2002  T:  10/15/2002  Job:  [id number]

## 2010-11-11 NOTE — Op Note (Signed)
[facility]  Patient:    [name], [name]                    MRN: [id number] Proc. Date: 08/15/00 Adm. Date:  [id number] Attending:  Collene Schlichter CC:         Harl Bowie, M.D.   Operative Report  PREOPERATIVE DIAGNOSIS:       1. Pelvic pain status post hysterectomy.                               2. Persisting adnexal cystic areas.  POSTOPERATIVE DIAGNOSIS:      1. Pelvic pain status post hysterectomy.                               2. Persisting adnexal cystic areas.                               3. Extensive pelvic adhesions.  OPERATION:                    1. Diagnostic laparoscopy.                               2. Lysis of adhesions.                               3. Exploratory laparotomy with extensive                                  lysis of adhesions.                               4. Bilateral salpingo-oophorectomy.  SURGEON:                      Almedia Balls. Randell Patient, M.D.  FIRST ASSISTANT:              Harl Bowie, M.D.  ANESTHESIA:                   General endotracheal anesthesia with fiberoptic intubation by Dr. Rica Mast secondary to patients previous mediastinal abscess and deviation of nasopharyngeal structures.  INDICATIONS FOR SURGERY:      The patient is a 77 year old status post hysterectomy with severe lower abdominal pain who was evaluated with ultrasound with findings of cystic structures in both adnexal areas.  These areas were quite tender with the vaginal probe.  The patient was counselled as to the need for surgery to evaluate and treat these problem and the type of surgery to be performed to include risks of anesthesia, injury to bowel, bladder, blood vessels, ureters, postoperative hemorrhage and infection, recuperation, and continuation of hormone replacement as needed following the removal of ovaries.  She fully understands all of these considerations and wishes to proceed on 15 August 2000.  OPERATIVE  FINDINGS:           On laparoscopy, the upper abdomen was obscured by adhesions thought to be secondary to previous cholecystectomy.  The left lobe of the liver was visualized and  appeared to be normal.  The spleen was normal.  In the pelvis, there were dense adhesions involving loops of bowel through the entire pelvic peritoneum, particularly over both ovaries.  The rectosigmoid was adherent in the midline, and multiple loops of small bowel were adherent, particularly in the area of the right tube and ovary.  There was also an adhesion involving the omentum and descending rectosigmoid.  DESCRIPTION OF PROCEDURE:     With the patient intubated under direct vision, prepared and draped in the usual sterile fashion, and with the Foley catheter in the bladder, an incision was made in the lower pole of the umbilicus. Veress cannula was inserted into the peritoneal cavity with insufflation of 3 liters of carbon dioxide.  A 10 mm trocar for the operative scope, and the scope itself was then inserted into the peritoneal cavity.  Reusable 5 mm probe was inserted through a stab wound just above the symphysis pubis.  The above-noted findings were visualized.  The adhesions involving small bowel through the anterior peritoneal surface were lysed using bipolar electrocoagulation for hemostasis and sharp dissection.  After opening this area, it was found that there were more dense adhesions in the cul-de-sac and along both lateral peritoneal surfaces.  These adhesions involved loops of bowel which obscured the ovaries bilaterally.  Accordingly, it was felt that an exploratory laparotomy was indicated.  The instruments were removed from the peritoneal cavity, and gas was allowed to escape.  The umbilical incision was closed with a fascial suture of 0 Vicryl an subcuticular suture of 3-0 plain catgut.  The lower abdominal trocar incision was then extended laterally in a transverse fashion just above  the symphysis pubis.  The patient had undergone a pubovaginal sling procedure for incontinence previously with large suture mass in the suprapubic area.  This was excised without difficulty.  Dissection was carried into the peritoneal cavity without difficulty, and the self-retaining retractor was placed.  The bowel was then packed out, and tedious dissection of the adhesions was carried out.  This occupied approximately 20 minutes of operating time.  The adhesions were lysed using sharp dissection and Bovie electrocautery for hemostasis. The adhesions involving all loops of bowel into the pelvic area were then lysed with some difficulty.  The right ovary was then elevated.  The retroperitoneal space was opened using Bovie electrocautery to transect the round ligament.  The infundibulopelvic ligament was then identified well above the ureter, clamped, cut, and doubly ligated with one chromic catgut.  Bovie electrocoagulation was then used to remove the tube and ovary from their attachments on the peritoneal surface.  Care was taken to avoid the ureter which was deep to all these areas.  A similar procedure was carried out on the left tube and ovary with identification of the infundibulopelvic ligament and the ureter below the ligament.  This structure was clamped, cut, and doubly ligated with one chromic catgut.  Again, Bovie electrocoagulation was used to render the peritoneal edge and attachment to the tube and ovary hemostatic with excision of the left tube and ovary as well.  Small bleeders were rendered hemostatic with Bovie electrocoagulation.  The area was lavaged with copious amounts of lactated Ringers solution.  After noted hemostasis was maintained and that sponge and instrument counts were correct, the peritoneum was closed with a continuous suture of 0 Vicryl.  Fascia was closed with two sutures of 0 Vicryl which were brought from the lateral aspects and tied separately in the  midline.  Subcutaneous fat was reapproximated with interrupted sutures of 0 Vicryl.  The skin was closed with a subcuticular  suture of 3-0 plain catgut.  Estimated blood loss 100 ml.  The patient was taken to the recovery room in good condition with clear urine in the Foley catheter tubing.  She will be placed on 23-hour observation following surgery. D:  08/15/00 TD:  08/16/00 Job: [id number]

## 2010-11-11 NOTE — Discharge Summary (Signed)
   NAME:  [name], [name]                       ACCOUNT NO.:  [id number]   MEDICAL RECORD NO.:  [id number]                   PATIENT TYPE:  INP   LOCATION:  3106                                 FACILITY:  [facility]   PHYSICIAN:  Payton Doughty, M.D.                   DATE OF BIRTH:  Sep 06, 1930   DATE OF ADMISSION:  10/15/2002  DATE OF DISCHARGE:  10/21/2002                                 DISCHARGE SUMMARY   ADMITTING DIAGNOSIS:  Spondylosis C3-4.   POSTOPERATIVE DIAGNOSIS:  Spondylosis C3-4.   PROCEDURE:  C3-4 posterior cervical decompression and C3-C4 posterolateral  arthrodesis.   COMPLICATIONS:  None.   DISCHARGE STATUS:  To rehab, alive and well.   HOSPITAL COURSE:  This is a 77 year old right-handed white lady whose  history and physical is recounted in the chart.  She has been fused  anteriorly from C4 to C7, had an esophageal injury and has dense scarring  anteriorly.  She developed spondylosis at C3-4 and spinal cord compression,  and was admitted for posterior decompression and fusion.  Medical history is  also remarkable for macular degeneration.  Her history and physical is  recounted in the chart.  She was admitted after ascertaining normal  laboratory values and underwent a posterior stabilization.  Postoperatively,  she did reasonably well.  She was in the ICU for a few days, not so much for  her neck but just for difficulties getting around and her arm and neck pain  was markedly diminished.  She had some right shoulder pain which markedly  diminished on a little bit of Neurontin.  By the October 20, 2002, she was out  on the regular floor and had been evaluated by rehab.  A bed became  available on October 21, 2002, and she was transferred to rehab for help  getting around because of her blindness.  Her follow-up will be in the  [facility] office in about a week for suture removal.                                               Payton Doughty,  M.D.    MWR/MEDQ  D:  12/24/2002  T:  12/24/2002  Job:  [id number]

## 2011-01-20 ENCOUNTER — Other Ambulatory Visit: Payer: Self-pay | Admitting: *Deleted

## 2011-01-20 MED ORDER — ESOMEPRAZOLE MAGNESIUM 40 MG PO CPDR: 40.0000 mg | DELAYED_RELEASE_CAPSULE | Freq: Every day | ORAL | Status: DC

## 2011-02-07 ENCOUNTER — Ambulatory Visit: Payer: Medicare Other | Admitting: Gastroenterology

## 2011-02-10 ENCOUNTER — Ambulatory Visit (INDEPENDENT_AMBULATORY_CARE_PROVIDER_SITE_OTHER): Payer: Medicare Other | Admitting: Gastroenterology

## 2011-02-10 ENCOUNTER — Encounter: Payer: Self-pay | Admitting: Gastroenterology

## 2011-02-10 DIAGNOSIS — K5730 Diverticulosis of large intestine without perforation or abscess without bleeding: Secondary | ICD-10-CM

## 2011-02-10 DIAGNOSIS — K56609 Unspecified intestinal obstruction, unspecified as to partial versus complete obstruction: Secondary | ICD-10-CM

## 2011-02-10 DIAGNOSIS — K56699 Other intestinal obstruction unspecified as to partial versus complete obstruction: Secondary | ICD-10-CM

## 2011-02-10 MED ORDER — LUBIPROSTONE 24 MCG PO CAPS: 24.0000 ug | ORAL_CAPSULE | Freq: Two times a day (BID) | ORAL | Status: AC

## 2011-02-10 NOTE — Progress Notes (Signed)
This is a very, very complex 77 year old Caucasian female with scleroderma of her skin and lungs with associated pulmonary hypertension, coronary artery disease and previous stenting followed by Dr. Dietrich Pates in cardiology. She has refractory abdominal pain and constipation from severe diverticulosis documented by previous CT scans and colonoscopies. Last colonoscopy in June of 2011 by Dr. Melvia Heaps showed severe sigmoid stenosis with a fixed, adhesed left colon from previous diverticulitis.  The patient continues with refractory constipation and will not have a bowel movement for 7-10 days and frequently has to go to the emergency room for pain control and disimpaction. She relates that she is at the point where" something has to be done". She has tried MiraLax, lactulose, and almost all stimulant laxatives. She currently has not had a bowel movement in one week and complains of worsening lower abdominal discomfort without fever chills, nausea or vomiting or other systemic complaints. She is on a multitude of medications included aspirin  325 mg twice a day and Ticlid 250 mg twice a day. She also has insulin-dependent diabetes, hypothyroidism, and pulmonary fibrosis. She the past had reactions to penicillin and sulfa.   Current Medications, Allergies, Past Medical History, Past Surgical History, Family History and Social History were reviewed in [employer] record.  Pertinent Review of Systems Negative... she denies chest pain at rest but is on nitroglycerin as needed for angina. She is followed by Dr. Craige Cotta in pulmonary medicine and takes prednisone 5 mg alternating with 2.5 mg every other day and Mucinex. She is rather marked diminished exercise tolerance as one would expect. She denies acid reflux symptoms but is on Nexium 40 mg a day. She has had previous cholecystectomy and removal of a common bile duct stone by Dr. Vilinda Blanks over 20 years ago. 10 point review of systems  otherwise negative.   Physical Exam: Elderly appearing white female in no acute distress appears stated age. Her abdomen shows no distention, masses or tenderness or abnormal bowel sounds. There is a well-healed right upper quadrant cholecystectomy scar. Inspection of her rectum is unremarkable as is rectal exam. There is no evidence of a fecal impaction, masses or tenderness. There is no stool present for guaiac testing. Mental status is normal with no gross focal neurological deficits.    Assessment and Plan: Worsening sigmoid stenosis and obstruction from recurrent diverticulitis and adhesions. This patient is high risk for surgery, but I think she would benefit from laparoscopic sigmoid resection if possible. She would need clearance from pulmonary and cardiology of course. I placed a right followup of Amitiza 24 mcg twice a day, Perdiem with senna twice a day, when necessary MiraLax, and today I placed her on a SupraPrep bowel cleansing regime for usual colonoscopy preparation. We will try to schedule her for surgical consultation with Dr. Karie Soda. Otherwise she is to continue her other medications as listed and reviewed her record. Her acid reflux is doing well on Nexium 40 mg a day. Encounter Diagnoses  Name Primary?  . Diverticulosis of colon (without mention of hemorrhage)   . Stricture intestinal    Please copy her primary care physician, referring physician, and pertinent subspecialists.

## 2011-02-10 NOTE — Patient Instructions (Signed)
Follow the Suprep Instructions.  After your finished the Suprep start Amitiza one tablet by mouth twice a day with meals.  Take Predium with senna OTC one tablet by mouth twice a day.  We have referred you to [facility], Dr Michaell Cowing either their office or our office will contact you with an appt.

## 2011-03-02 ENCOUNTER — Ambulatory Visit (INDEPENDENT_AMBULATORY_CARE_PROVIDER_SITE_OTHER): Payer: Medicare Other | Admitting: Surgery

## 2011-03-13 ENCOUNTER — Ambulatory Visit (INDEPENDENT_AMBULATORY_CARE_PROVIDER_SITE_OTHER): Payer: Medicare Other | Admitting: Surgery

## 2011-03-13 ENCOUNTER — Encounter: Payer: Self-pay | Admitting: Pulmonary Disease

## 2011-03-13 ENCOUNTER — Ambulatory Visit (INDEPENDENT_AMBULATORY_CARE_PROVIDER_SITE_OTHER)
Admission: RE | Admit: 2011-03-13 | Discharge: 2011-03-13 | Disposition: A | Discharge status: Home / Self Care | Payer: Medicare Other | Source: Ambulatory Visit | Attending: Pulmonary Disease | Admitting: Pulmonary Disease

## 2011-03-13 ENCOUNTER — Ambulatory Visit (INDEPENDENT_AMBULATORY_CARE_PROVIDER_SITE_OTHER): Payer: Medicare Other | Admitting: Pulmonary Disease

## 2011-03-13 VITALS — BP 120/64 | HR 76 | Temp 98.7°F | Ht 62.5 in | Wt 135.4 lb

## 2011-03-13 DIAGNOSIS — J8409 Other alveolar and parieto-alveolar conditions: Secondary | ICD-10-CM

## 2011-03-13 DIAGNOSIS — J310 Chronic rhinitis: Secondary | ICD-10-CM

## 2011-03-13 DIAGNOSIS — M349 Systemic sclerosis, unspecified: Secondary | ICD-10-CM

## 2011-03-13 MED ORDER — MAGIC MOUTHWASH: 5.0000 mL | Freq: Three times a day (TID) | ORAL | Status: DC | PRN

## 2011-03-13 MED ORDER — PREDNISONE 5 MG PO TABS: 5.0000 mg | ORAL_TABLET | Freq: Every day | ORAL | Status: AC

## 2011-03-13 NOTE — Progress Notes (Signed)
Subjective:    Patient ID: [name], female    DOB: 04/22/1931, 77 y.o.   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  She remains on prednisone 5 mg per day.  She states that her breathing gets terrible if she tries a lower dose.  She is not having much cough or wheeze.  She denies chest pain, fever, or hemopytsis.  She did have a cough recently from post-nasal drip, and she was also treated with diflucan and magic mouthwash for thrush.  She does not get flu shots>>she has not been able to tolerate these in the past.  Past Medical History  Diagnosis Date  . Coronary artery disease   . Dressler's syndrome   . Hypertension   . Dyslipidemia   . Diabetes mellitus   . CVA (cerebral infarction) 1991  . Macular degeneration   . Depression   . GERD (gastroesophageal reflux disease)   . Diverticulosis   . IBS (irritable bowel syndrome)   . Zenker's diverticulum   . Emphysema   . Pulmonary hypertension   . Chronic sinusitis   . Fibromyalgia   . Hypothyroidism   . Scleroderma     followed by Dr. Dareen Piano  . Sjogren's disease   . Raynaud's phenomenon   . Cervical disc disease   . Adenomatous colon polyp     Family History  Problem Relation Age of Onset  . Other Mother 26    Died from sepsis  . Stroke Father 25    died    History   Social History  . Marital Status: Divorced   Occupational History  . retired    Social History Main Topics  . Smoking status: Former Smoker -- 1.0 packs/day for 40 years    Types: Cigarettes    Quit date: 06/26/1988   Comment: 40 pack years  . Alcohol Use: No  . Drug Use: No   Allergies  Allergen Reactions  . Lopressor Hct     REACTION: decreased bp  . Penicillins     REACTION: hives  . Sulfonamide Derivatives     REACTION: hives    Review of Systems     Objective:   Physical Exam  BP 120/64  Pulse 76   Temp(Src) 98.7 F (37.1 C) (Oral)  Ht 5' 2.5" (1.588 m)  Wt 135 lb 6.4 oz (61.417 kg)  BMI 24.37 kg/m2  SpO2 99%  General - Healthy appearing HEENT - no sinus tenderness, clear nasal discharge, no oral exudate, no thrush, stable tongue lesion, no LAN Cardiac - s1s2 regular, no murmur Chest - no wheeze/rales Abd - soft, nontender Ext - no edema, skin tightening over fingers Neuro - normal strength Psych - normal mood/behavior    Assessment & Plan:   OTH SPEC ALVEOL&PARIETOALVEOL PNEUMONOPATHIES 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  She has been difficult to tolerate weaning from prednisone.  She is to continue prednisone 5 mg daily.  Will repeat chest xray today and call with results.    Chronic rhinitis I think her cough and throat irritation may be related to post-nasal drip.  She is to continue nasal irrigation and nasonex.  Will give her refill of magic mouthwash to used as needed.  I did not see evidence for thrush on exam today, and will defer restarting diflucan for now.  Systemic sclerosis She is followed by Dr. Dareen Piano with Rheumatology.  Updated Medication List Outpatient Encounter Prescriptions as of 03/13/2011  Medication Sig Dispense Refill  . aspirin 325 MG tablet Take 325 mg by mouth 2 (two) times daily.       [name] docusate sodium (DULCOLAX) 100 MG capsule Take 100 mg by mouth as needed.        [name] esomeprazole (NEXIUM) 40 MG capsule Take 1 capsule (40 mg total) by mouth daily before breakfast.  30 capsule  6  . guaiFENesin (MUCINEX) 600 MG 12 hr tablet Take 1,200 mg by mouth as needed.        [name] imipramine (TOFRANIL-PM) 75 MG capsule Take 75 mg by mouth at bedtime. 3 before bed       . insulin glargine (LANTUS) 100 UNIT/ML injection Inject into the skin at bedtime. 8-12 untis       . levothyroxine (SYNTHROID, LEVOTHROID) 125 MCG tablet Take 125 mcg by mouth daily.        . mometasone (NASONEX) 50 MCG/ACT nasal spray 2 sprays by Nasal route as needed.         . nitroGLYCERIN (NITROSTAT) 0.4 MG SL tablet Place 0.4 mg under the tongue every 5 (five) minutes as needed.        . senna (SENOKOT) 8.6 MG tablet Take 1 tablet by mouth as needed.        . ticlopidine (TICLID) 250 MG tablet Take 250 mg by mouth 2 (two) times daily.        . triazolam (HALCION) 0.25 MG tablet Take 0.25 mg by mouth at bedtime as needed.        [name] DISCONTD: predniSONE (DELTASONE) 1 MG tablet 1/2 pill every other day      . Alum & Mag Hydroxide-Simeth (MAGIC MOUTHWASH) SOLN Take 5 mLs by mouth 3 (three) times daily as needed.  120 mL  0  . lubiprostone (AMITIZA) 24 MCG capsule Take 1 capsule (24 mcg total) by mouth 2 (two) times daily with a meal.  60 capsule  3  . predniSONE (DELTASONE) 5 MG tablet Take 1 tablet (5 mg total) by mouth daily.      [name] DISCONTD: HYDROcodone-acetaminophen (LORCET) 10-650 MG per tablet As needed

## 2011-03-13 NOTE — Assessment & Plan Note (Signed)
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  She has been difficult to tolerate weaning from prednisone.  She is to continue prednisone 5 mg daily.  Will repeat chest xray today and call with results.

## 2011-03-13 NOTE — Patient Instructions (Signed)
Chest xray today>>will call with results Magic mouth wash 5 ml three times per day as needed Follow up in 6 months

## 2011-03-13 NOTE — Assessment & Plan Note (Signed)
I think her cough and throat irritation may be related to post-nasal drip.  She is to continue nasal irrigation and nasonex.  Will give her refill of magic mouthwash to used as needed.  I did not see evidence for thrush on exam today, and will defer restarting diflucan for now.

## 2011-03-13 NOTE — Assessment & Plan Note (Signed)
She is followed by Dr. Anderson with Rheumatology. 

## 2011-03-14 ENCOUNTER — Telehealth: Payer: Self-pay | Admitting: Pulmonary Disease

## 2011-03-14 NOTE — Telephone Encounter (Signed)
CHEST - 2 VIEW 03/13/11: Comparison: 06/23/2010  Findings: Heart size is normal. Vascularity is normal. Mild to moderate hyperinflation is unchanged. Negative for infiltrate or effusion. Negative for mass or adenopathy.  IMPRESSION:  Pulmonary hyperinflation without acute radiographic abnormality.   Left message detailing results.  Advised no change needed to current treatment plan.  Advised she can call back if she has questions.

## 2011-03-22 LAB — DIFFERENTIAL
Basophils Absolute: 0
Basophils Relative: 1
Eosinophils Absolute: 0
Eosinophils Relative: 1
Lymphocytes Relative: 14
Lymphs Abs: 0.8
Monocytes Absolute: 0.3
Monocytes Relative: 5
Neutro Abs: 4.8
Neutrophils Relative %: 80 — ABNORMAL HIGH

## 2011-03-22 LAB — CBC
HCT: 31 — ABNORMAL LOW
HCT: 32.8 — ABNORMAL LOW
HCT: 34.3 — ABNORMAL LOW
Hemoglobin: 10.6 — ABNORMAL LOW
Hemoglobin: 11.2 — ABNORMAL LOW
Hemoglobin: 11.5 — ABNORMAL LOW
MCHC: 33.5
MCHC: 34
MCHC: 34.3
MCV: 85.3
MCV: 85.5
MCV: 86.5
Platelets: 176
Platelets: 179
Platelets: 220
RBC: 3.63 — ABNORMAL LOW
RBC: 3.84 — ABNORMAL LOW
RBC: 3.96
RDW: 13.2
RDW: 13.3
RDW: 13.6
WBC: 10.4
WBC: 5.9
WBC: 6.9

## 2011-03-22 LAB — CK TOTAL AND CKMB (NOT AT ARMC)
CK, MB: 159.3 — ABNORMAL HIGH
CK, MB: 17.1 — ABNORMAL HIGH
Relative Index: 11.9 — ABNORMAL HIGH
Relative Index: 17.2 — ABNORMAL HIGH
Total CK: 144
Total CK: 925 — ABNORMAL HIGH

## 2011-03-22 LAB — BASIC METABOLIC PANEL
BUN: 11
BUN: 14
CO2: 29
CO2: 31
Calcium: 8.3 — ABNORMAL LOW
Calcium: 8.5
Chloride: 100
Chloride: 101
Creatinine, Ser: 0.83
Creatinine, Ser: 0.86
GFR calc Af Amer: >60
GFR calc Af Amer: >60
GFR calc non Af Amer: >60
GFR calc non Af Amer: >60
Glucose, Bld: 164 — ABNORMAL HIGH
Glucose, Bld: 178 — ABNORMAL HIGH
Potassium: 3.4 — ABNORMAL LOW
Potassium: 4.1
Sodium: 135
Sodium: 137

## 2011-03-22 LAB — LIPID PANEL
Cholesterol: 134
Cholesterol: 149
HDL: 31 — ABNORMAL LOW
HDL: 32 — ABNORMAL LOW
LDL Cholesterol: 90
LDL Cholesterol: 91
Total CHOL/HDL Ratio: 4.3
Total CHOL/HDL Ratio: 4.7
Triglycerides: 134
Triglycerides: 62
VLDL: 12
VLDL: 27

## 2011-03-22 LAB — POCT I-STAT, CHEM 8
BUN: 18
Calcium, Ion: 1.14
Chloride: 102
Creatinine, Ser: 0.8
Glucose, Bld: 171 — ABNORMAL HIGH
HCT: 36
Hemoglobin: 12.2
Potassium: 3.5
Sodium: 140
TCO2: 27

## 2011-03-22 LAB — HEPATIC FUNCTION PANEL
ALT: 25
AST: 98 — ABNORMAL HIGH
Albumin: 3.5
Alkaline Phosphatase: 54
Bilirubin, Direct: 0.1
Indirect Bilirubin: 0.5
Total Bilirubin: 0.6
Total Protein: 6.2

## 2011-03-22 LAB — COMPREHENSIVE METABOLIC PANEL
ALT: 12
AST: 23
Albumin: 2.8 — ABNORMAL LOW
Alkaline Phosphatase: 44
BUN: 17
CO2: 28
Calcium: 7.5 — ABNORMAL LOW
Chloride: 105
Creatinine, Ser: 0.73
GFR calc Af Amer: >60
GFR calc non Af Amer: >60
Glucose, Bld: 161 — ABNORMAL HIGH
Potassium: 3.3 — ABNORMAL LOW
Sodium: 136
Total Bilirubin: 0.4
Total Protein: 4.9 — ABNORMAL LOW

## 2011-03-22 LAB — PROTIME-INR
INR: 7.1
Prothrombin Time: 64.3 — ABNORMAL HIGH

## 2011-03-22 LAB — TSH: TSH: 0.613

## 2011-03-22 LAB — CARDIAC PANEL(CRET KIN+CKTOT+MB+TROPI)
CK, MB: 164.1 — ABNORMAL HIGH
CK, MB: 233.9 — ABNORMAL HIGH
CK, MB: 87.6 — ABNORMAL HIGH
Relative Index: 14.3 — ABNORMAL HIGH
Relative Index: 17.9 — ABNORMAL HIGH
Relative Index: 18 — ABNORMAL HIGH
Total CK: 1303 — ABNORMAL HIGH
Total CK: 613 — ABNORMAL HIGH
Total CK: 919 — ABNORMAL HIGH
Troponin I: 15.98
Troponin I: 22.31
Troponin I: 31.81

## 2011-03-22 LAB — TROPONIN I
Troponin I: 1.13
Troponin I: 21.41

## 2011-03-22 LAB — LACTATE DEHYDROGENASE: LDH: 115

## 2011-03-22 LAB — APTT: aPTT: 162 — ABNORMAL HIGH

## 2011-03-23 ENCOUNTER — Telehealth (INDEPENDENT_AMBULATORY_CARE_PROVIDER_SITE_OTHER): Payer: Self-pay

## 2011-03-23 LAB — CARDIAC PANEL(CRET KIN+CKTOT+MB+TROPI)
CK, MB: 1.2
CK, MB: 1.5
CK, MB: 1.7
CK, MB: 1.2
CK, MB: 1.6
Relative Index: INVALID
Relative Index: INVALID
Relative Index: INVALID
Relative Index: INVALID
Relative Index: INVALID
Total CK: 37
Total CK: 40
Total CK: 47
Total CK: 47
Total CK: 47
Troponin I: 0.1 — ABNORMAL HIGH
Troponin I: 0.11 — ABNORMAL HIGH
Troponin I: 0.14 — ABNORMAL HIGH
Troponin I: 0.01
Troponin I: 0.03

## 2011-03-23 LAB — COMPREHENSIVE METABOLIC PANEL
ALT: 47 — ABNORMAL HIGH
ALT: 56 — ABNORMAL HIGH
AST: 42 — ABNORMAL HIGH
AST: 64 — ABNORMAL HIGH
Albumin: 3.4 — ABNORMAL LOW
Albumin: 3.7
Alkaline Phosphatase: 84
Alkaline Phosphatase: 91
BUN: 15
BUN: 14
CO2: 26
CO2: 27
Calcium: 8.7
Calcium: 8.5
Chloride: 104
Chloride: 98
Creatinine, Ser: 0.96
Creatinine, Ser: 1.04
GFR calc Af Amer: >60
GFR calc Af Amer: >60
GFR calc non Af Amer: 57 — ABNORMAL LOW
GFR calc non Af Amer: 52 — ABNORMAL LOW
Glucose, Bld: 123 — ABNORMAL HIGH
Glucose, Bld: 135 — ABNORMAL HIGH
Potassium: 4.3
Potassium: 3.8
Sodium: 137
Sodium: 133 — ABNORMAL LOW
Total Bilirubin: 0.5
Total Bilirubin: 0.6
Total Protein: 6.1
Total Protein: 6.4

## 2011-03-23 LAB — DIFFERENTIAL
Basophils Absolute: 0.1
Basophils Relative: 1
Eosinophils Absolute: 0.2
Eosinophils Relative: 2
Lymphocytes Relative: 20
Lymphs Abs: 2.2
Monocytes Absolute: 1.2 — ABNORMAL HIGH
Monocytes Relative: 11
Neutro Abs: 7.5
Neutrophils Relative %: 67

## 2011-03-23 LAB — URINALYSIS, ROUTINE W REFLEX MICROSCOPIC
Bilirubin Urine: NEGATIVE
Glucose, UA: NEGATIVE
Ketones, ur: NEGATIVE
Nitrite: NEGATIVE
Protein, ur: NEGATIVE
Specific Gravity, Urine: 1.031 — ABNORMAL HIGH
Urobilinogen, UA: 0.2
pH: 6

## 2011-03-23 LAB — CK TOTAL AND CKMB (NOT AT ARMC)
CK, MB: 0.8
Relative Index: INVALID
Total CK: 41

## 2011-03-23 LAB — CBC
HCT: 32.2 — ABNORMAL LOW
HCT: 31.6 — ABNORMAL LOW
HCT: 35.6 — ABNORMAL LOW
Hemoglobin: 10.9 — ABNORMAL LOW
Hemoglobin: 10.9 — ABNORMAL LOW
Hemoglobin: 12
MCHC: 34
MCHC: 33.8
MCHC: 34.6
MCV: 85.3
MCV: 85.3
MCV: 85.5
Platelets: 236
Platelets: 182
Platelets: 249
RBC: 3.78 — ABNORMAL LOW
RBC: 3.7 — ABNORMAL LOW
RBC: 4.17
RDW: 13.5
RDW: 13.7
RDW: 13.7
WBC: 7.3
WBC: 11.3 — ABNORMAL HIGH
WBC: 9.5

## 2011-03-23 LAB — BASIC METABOLIC PANEL
BUN: 13
BUN: 15
CO2: 23
CO2: 27
Calcium: 7.8 — ABNORMAL LOW
Calcium: 8.5
Chloride: 101
Chloride: 98
Creatinine, Ser: 0.75
Creatinine, Ser: 0.93
GFR calc Af Amer: >60
GFR calc Af Amer: >60
GFR calc non Af Amer: 59 — ABNORMAL LOW
GFR calc non Af Amer: >60
Glucose, Bld: 160 — ABNORMAL HIGH
Glucose, Bld: 191 — ABNORMAL HIGH
Potassium: 4
Potassium: 4.5
Sodium: 132 — ABNORMAL LOW
Sodium: 133 — ABNORMAL LOW

## 2011-03-23 LAB — PROTIME-INR
INR: 1
INR: 1.1
Prothrombin Time: 13.5
Prothrombin Time: 14.6

## 2011-03-23 LAB — TSH
TSH: 1.904
TSH: 10.064 — ABNORMAL HIGH
TSH: 5.423

## 2011-03-23 LAB — D-DIMER, QUANTITATIVE (NOT AT ARMC): D-Dimer, Quant: 1.31 — ABNORMAL HIGH

## 2011-03-23 LAB — HEMOGLOBIN A1C
Hgb A1c MFr Bld: 6.5 — ABNORMAL HIGH
Mean Plasma Glucose: 154

## 2011-03-23 LAB — APTT
aPTT: 34
aPTT: 31
aPTT: 33

## 2011-03-23 LAB — B-NATRIURETIC PEPTIDE (CONVERTED LAB): Pro B Natriuretic peptide (BNP): 159 — ABNORMAL HIGH

## 2011-03-23 LAB — POCT CARDIAC MARKERS
CKMB, poc: <1 — ABNORMAL LOW
Myoglobin, poc: 71.2
Operator id: 234501
Troponin i, poc: <0.05

## 2011-03-23 LAB — TROPONIN I: Troponin I: 0.04

## 2011-03-23 LAB — URINE MICROSCOPIC-ADD ON

## 2011-03-23 NOTE — Telephone Encounter (Signed)
Lmom with medical records stating i need more info on why pt is coming in for eval. Lap sigmoid resection all i received from their office is some labs w/a cover sheet that said no colon or path reports. I stated I need to speak to someone to see who I need to call to get info on this pt before they come to the office Monday.[name]

## 2011-03-27 ENCOUNTER — Encounter (INDEPENDENT_AMBULATORY_CARE_PROVIDER_SITE_OTHER): Payer: Self-pay | Admitting: Surgery

## 2011-03-27 ENCOUNTER — Ambulatory Visit (INDEPENDENT_AMBULATORY_CARE_PROVIDER_SITE_OTHER): Payer: Medicare Other | Admitting: Surgery

## 2011-03-27 VITALS — BP 130/64 | HR 88 | Temp 96.8°F | Resp 14 | Ht 63.5 in | Wt 134.1 lb

## 2011-03-27 DIAGNOSIS — K5909 Other constipation: Secondary | ICD-10-CM

## 2011-03-27 DIAGNOSIS — K56699 Other intestinal obstruction unspecified as to partial versus complete obstruction: Secondary | ICD-10-CM | POA: Insufficient documentation

## 2011-03-27 DIAGNOSIS — K56609 Unspecified intestinal obstruction, unspecified as to partial versus complete obstruction: Secondary | ICD-10-CM

## 2011-03-27 DIAGNOSIS — K5730 Diverticulosis of large intestine without perforation or abscess without bleeding: Secondary | ICD-10-CM

## 2011-03-27 NOTE — Progress Notes (Addendum)
Subjective:     Patient ID: [name], female   DOB: 08-30-1930, 77 y.o.   MRN: [id number]  HPI  Patient Care Team: Rama (Georgianne Fick) Sofie Hartigan as PCP - General (Internal Medicine) Sheryn Bison, MD as Consulting Physician (Gastroenterology) Kalman Shan, MD as Consulting Physician (Pulmonary Disease) Coralyn Helling, MD as Consulting Physician (Pulmonary Disease) Pricilla Riffle, MD as Consulting Physician (Cardiology)  This patient is a 77 y.o.female who presents today for surgical evaluation.   She was sent by her gastroenterologist to consider surgery.  She is a pleasant elderly woman with numerous medical problems. She has struggled with major constipation for several years. She has had numerous prior abdominal surgeries. She's been told that she has a moderate amount of adhesions.   She has been a difficult endoscopy in the past. She has known diverticulosis. She had a colonoscopy last year by Dr. Oscar La with [facility] which showed a probable lipoma in her transverse colon and a pretty significant stricture at around 50 cm from the anus. She's been on numerous bowel regimens. She's been to the ER and had to be cleaned out. It has been a struggle for her. She has a lot of left-sided abdominal pain. Because of her numerous medical conditions, they've been hesitant to recommend surgery. However her symptoms have progressively worsened. She gets a bowel movement once a day. She is failed numerous oral medicines to try and help improve things.   Because of the worsening stricture and no proof of malignancy, it is been recommended that she consider surgery. She has steroid-dependent COPD and ?scleroderma. She has coronary disease and has had stents placed. The last one was last year. She is on chronic anticoagulation (Ticlid). She can walk 100 feet before she has to stop secondary to being tired. She is not on oxygen. No exertional chest pain. She is legally blind.    Past Medical  History  Diagnosis Date  . Coronary artery disease   . Dressler's syndrome   . Hypertension   . Dyslipidemia   . Diabetes mellitus   . CVA (cerebral infarction) 1991  . Macular degeneration   . Depression   . GERD (gastroesophageal reflux disease)   . Diverticulosis   . IBS (irritable bowel syndrome)   . Zenker's diverticulum   . Emphysema   . Pulmonary hypertension   . Chronic sinusitis   . Fibromyalgia   . Hypothyroidism   . Scleroderma     followed by Dr. Dareen Piano  . Sjogren's disease   . Raynaud's phenomenon   . Cervical disc disease   . Adenomatous colon polyp   . Scleroderma   . COPD (chronic obstructive pulmonary disease)   . Arthritis   . Blood transfusion   . Myocardial infarction may 27th 2007    Past Surgical History  Procedure Date  . Total abdominal hysterectomy 1973  . Bilateral oophorectomy 2002  . Spinal fusion 1997    and implantation of a plate   . Lobe thyroid removal   . Thyroid lobectomy 1991    removal of left lobe thyroid  . Cholecystectomy 1965  . Appendectomy   . Cataract extraction   . Bronchoscopy 02-22-09  . Stent surgery     x3    History   Social History  . Marital Status: Divorced    Spouse Name: N/A    Number of Children: N/A  . Years of Education: N/A   Occupational History  . retired    Social History  Main Topics  . Smoking status: Former Smoker -- 1.0 packs/day for 40 years    Types: Cigarettes    Quit date: 06/26/1988  . Smokeless tobacco: Not on file   Comment: 40 pack years  . Alcohol Use: No  . Drug Use: No  . Sexually Active: Not on file   Other Topics Concern  . Not on file   Social History Narrative  . No narrative on file    Family History  Problem Relation Age of Onset  . Other Mother 65    Died from sepsis  . Stroke Father 23    died    Current outpatient prescriptions:aspirin 325 MG tablet, Take 325 mg by mouth 2 (two) times daily. , Disp: , Rfl: ;  esomeprazole (NEXIUM) 40 MG capsule,  Take 1 capsule (40 mg total) by mouth daily before breakfast., Disp: 30 capsule, Rfl: 6;  guaiFENesin (MUCINEX) 600 MG 12 hr tablet, Take 1,200 mg by mouth as needed.  , Disp: , Rfl: ;  imipramine (TOFRANIL) 25 MG tablet, , Disp: , Rfl:  imipramine (TOFRANIL-PM) 75 MG capsule, Take 75 mg by mouth at bedtime. 3 before bed , Disp: , Rfl: ;  insulin glargine (LANTUS) 100 UNIT/ML injection, Inject into the skin at bedtime. 8-12 untis , Disp: , Rfl: ;  levothyroxine (SYNTHROID, LEVOTHROID) 125 MCG tablet, Take 125 mcg by mouth daily.  , Disp: , Rfl: ;  nitroGLYCERIN (NITROSTAT) 0.4 MG SL tablet, Place 0.4 mg under the tongue every 5 (five) minutes as needed.  , Disp: , Rfl:  predniSONE (DELTASONE) 5 MG tablet, , Disp: , Rfl: ;  senna (SENOKOT) 8.6 MG tablet, Take 1 tablet by mouth as needed.  , Disp: , Rfl: ;  ticlopidine (TICLID) 250 MG tablet, Take 250 mg by mouth 2 (two) times daily.  , Disp: , Rfl: ;  triazolam (HALCION) 0.25 MG tablet, Take 0.25 mg by mouth at bedtime as needed.  , Disp: , Rfl:   Allergies  Allergen Reactions  . Lopressor Hct     REACTION: decreased bp  . Penicillins     REACTION: hives  . Sulfonamide Derivatives     REACTION: hives  . Titanium     Cervical plate - had to change for stainless steel       Review of Systems  Constitutional: Negative for fever, chills, diaphoresis, activity change, appetite change, fatigue and unexpected weight change.  HENT: Negative for ear pain, sore throat, trouble swallowing, neck pain and ear discharge.   Eyes: Positive for visual disturbance. Negative for photophobia, pain, discharge and itching.  Respiratory: Positive for shortness of breath. Negative for cough, choking, chest tightness, wheezing and stridor.   Cardiovascular: Negative for chest pain, palpitations and leg swelling.  Gastrointestinal: Positive for abdominal pain, constipation and abdominal distention. Negative for nausea, vomiting, diarrhea, blood in stool, anal bleeding  and rectal pain.       BM once a week with laxatives  Genitourinary: Negative for dysuria, frequency, enuresis, difficulty urinating and pelvic pain.  Musculoskeletal: Negative for myalgias and gait problem.  Skin: Negative for color change, pallor and rash.  Neurological: Negative for dizziness, speech difficulty, weakness and numbness.  Hematological: Negative for adenopathy.  Psychiatric/Behavioral: Negative for confusion and agitation. The patient is not nervous/anxious.        Objective:   Physical Exam  Constitutional: She is oriented to person, place, and time. She appears well-developed and well-nourished. No distress.  HENT:  Head: Normocephalic.  Mouth/Throat:  Oropharynx is clear and moist. No oropharyngeal exudate.  Eyes: Conjunctivae, EOM and lids are normal. Pupils are equal, round, and reactive to light. Right conjunctiva is not injected. Left conjunctiva is not injected. No scleral icterus.       Sees poorly  Neck: Normal range of motion. Neck supple. No tracheal deviation present.  Cardiovascular: Normal rate, regular rhythm and intact distal pulses.   Pulmonary/Chest: Effort normal and breath sounds normal. No respiratory distress. She exhibits no tenderness.  Abdominal: Soft. She exhibits no distension and no mass. There is no tenderness. There is no rebound and no guarding. Hernia confirmed negative in the right inguinal area and confirmed negative in the left inguinal area.         Old incisions.  No hernias.  Mild L sided abd discomfort.  Mildly overweight.  Genitourinary: No vaginal discharge found.  Musculoskeletal: Normal range of motion. She exhibits no edema and no tenderness.  Lymphadenopathy:    She has no cervical adenopathy.       Right: No inguinal adenopathy present.       Left: No inguinal adenopathy present.  Neurological: She is alert and oriented to person, place, and time. No cranial nerve deficit. She exhibits normal muscle tone. Coordination  normal.  Skin: Skin is warm and dry. No rash noted. She is not diaphoretic. No erythema.  Psychiatric: She has a normal mood and affect. Her behavior is normal. Judgment and thought content normal.   CT enterography from 2011 notes probable stricture in a redundant sigmoid colon near the suprapubic region. Dilated colon proximally. Probable lipoma in the mid to distal transverse colon.  Numerous colonoscopies. Stricture of 50 cm from anus.  I cannot find any pathology reports on of this stricture    Assessment:     Worsening constipation and known colon stricture in a medically fragile patient    Plan:     Had a long discussion with the patient. I think she could benefit from partial colectomy to remove the strictured segment. She seems to have plenty of redundant colon so should be able to get good lymph node sampling to rule out malignancy. I doubt that I will be able to get the colon lipoma in this as well unless it is closer than billed.  She's had complications with surgery to the past. She is insulin-requiring and on steroids, so the risk of leak is higher than average. Her proclaimed allergy to titanium makes it more likely I will have to use a handsewn anastomosis and try to avoid staples if possible. She's had numerous abdominal surgeries so she may not be able to be done laparoscopically. However, she never had bowel obstructions and she is not particularly large; so, I think it is reasonable to start with a laparoscopic approach. I would have a low threshold using HandPort. I would like to try and keep incision small to minimize infection and hernia risks.  The biggest issue is whether should it is safe to do this from a cardiopulmonary standpoint. I would like get pulmonary clearance and cardiology clearance. If both specialties feel it is recently safe to proceed, then I will offer surgery to her.  She understands she is at increased risk. However, she's having dwindling tolerance to  nonoperative management. I worry she to get completely obstructed or perforate.  The anatomy & physiology of the digestive tract was discussed.  The pathophysiology was discussed.  Natural history risks without surgery was discussed.   I feel the  risks of no intervention will lead to serious problems that outweigh the operative risks; therefore, I recommended a partial colectomy to remove the pathology.  Laparoscopic & open techniques were discussed.   Risks such as bleeding, infection, abscess, leak, reoperation, possible ostomy, hernia, heart attack, death, and other risks were discussed.  Goals of post-operative recovery were discussed as well.  We will work to minimize complications.  An educational handout on the pathology was given as well.  Questions were answered.  The patient expresses understanding & wishes to proceed with surgery.  I also stressed to her that she will be in the hospital for at least a week. Given her numerous comorbidities, she is at risk for staying several weeks or more. She notes she had complications with her cervical spine surgery & in the hospital for 3 months. I cannot guarantee she will avoid problems. I noted it is not realistic to go home and be by herself and she was insisted she would. She will need at least nursing and family help. She may need to be at a skilled nursing facility. I think she understands that this is significant operation. She is aware of the risks.   She is leaning toward surgery but wants to think things through. I gave her my card. I encouraged her to call me or have family call me if there are further questions.

## 2011-03-27 NOTE — Patient Instructions (Signed)
Diverticulosis Diverticulosis is a common condition that develops when small pouches (diverticula) form in the wall of the colon. The risk of diverticulosis increases with age. It happens more often in people who eat a low-fiber diet. Most individuals with diverticulosis have no symptoms. Those individuals with symptoms usually experience belly (abdominal) pain, constipation, or loose stools (diarrhea). HOME CARE INSTRUCTIONS  Increase the amount of fiber in your diet as directed by your caregiver or dietician. This may reduce symptoms of diverticulosis.   Your caregiver may recommend taking a dietary fiber supplement.   Drink at least 6 to 8 glasses of water each day to prevent constipation.   Try not to strain when you have a bowel movement.   Your caregiver may recommend avoiding nuts and seeds to prevent complications, although this is still an uncertain benefit.   Only take over-the-counter or prescription medicines for pain, discomfort, or fever as directed by your caregiver.  FOODS HAVING HIGH FIBER CONTENT INCLUDE:  Fruits. Apple, peach, pear, tangerine, raisins, prunes.   Vegetables. Brussels sprouts, asparagus, broccoli, cabbage, carrot, cauliflower, romaine lettuce, spinach, summer squash, tomato, winter squash, zucchini.   Starchy Vegetables. Baked beans, kidney beans, lima beans, split peas, lentils, potatoes (with skin).   Grains. Whole wheat bread, brown rice, bran flake cereal, plain oatmeal, white rice, shredded wheat, bran muffins.  SEEK IMMEDIATE MEDICAL CARE IF:  You develop increasing pain or severe bloating.   You have an oral temperature above 160f, not controlled by medicine.   You develop vomiting or bowel movements that are bloody or black.  Document Released: 03/09/2004 Document Re-Released: 11/30/2009 [facility] Patient Information 2011 [place], [place].Colectomy, (Colon, Removal of) Care After AFTER YOUR SURGERY After surgery, you will be taken to the  recovery area where a nurse will watch you and check your progress. After the recovery area you will go to your hospital room. Your surgeon will determine when it is alright for you to take fluids and foods. You will be given pain medications to keep you comfortable. HOME CARE INSTRUCTIONS AFTER YOUR SURGERY  Once home an ice pack applied to the operative site may help with discomfort and keep swelling down.   Change dressings as directed.   Only take over-the-counter or prescription medicines for pain, discomfort, or fever as directed by your caregiver.   You may continue normal diet and activities as directed.   There should be no heavy lifting (more than 10 pounds), strenuous activities or contact sports for three weeks, or as directed.   Keep the wound dry and clean. The wound may be washed gently with soap and water. Gently blot or dab dry following cleansing, without rubbing. Do not take baths, use swimming pools, or use hot tubs for ten days, or as instructed by your caregivers.   If you have a colostomy, care for it as you have been shown.  SEEK MEDICAL CARE IF:  There is redness, swelling, or increasing pain in the wound area.   Pus is coming from the wound.   An unexplained oral temperature above 138f develops.   You notice a foul smell coming from the wound or dressing.   There is a breaking open of a wound (edges not staying together) after the sutures have been removed.   There is increasing abdominal pain.  SEEK IMMEDIATE MEDICAL CARE IF:  A rash develops.   There is difficulty breathing, or development of a reaction or side effects to medications given.  Document Released: 01/11/2004 Document Re-Released: 11/30/2009  [employer] Patient Information 2011 [place].

## 2011-03-28 LAB — GLUCOSE, CAPILLARY
Glucose-Capillary: 103 mg/dL — ABNORMAL HIGH (ref 70–99)
Glucose-Capillary: 106 mg/dL — ABNORMAL HIGH (ref 70–99)
Glucose-Capillary: 107 mg/dL — ABNORMAL HIGH (ref 70–99)
Glucose-Capillary: 127 mg/dL — ABNORMAL HIGH (ref 70–99)
Glucose-Capillary: 75 mg/dL (ref 70–99)
Glucose-Capillary: 85 mg/dL (ref 70–99)
Glucose-Capillary: 85 mg/dL (ref 70–99)
Glucose-Capillary: 86 mg/dL (ref 70–99)
Glucose-Capillary: 105 mg/dL — ABNORMAL HIGH (ref 70–99)
Glucose-Capillary: 109 mg/dL — ABNORMAL HIGH (ref 70–99)
Glucose-Capillary: 112 mg/dL — ABNORMAL HIGH (ref 70–99)
Glucose-Capillary: 113 mg/dL — ABNORMAL HIGH (ref 70–99)
Glucose-Capillary: 119 mg/dL — ABNORMAL HIGH (ref 70–99)
Glucose-Capillary: 124 mg/dL — ABNORMAL HIGH (ref 70–99)
Glucose-Capillary: 134 mg/dL — ABNORMAL HIGH (ref 70–99)
Glucose-Capillary: 143 mg/dL — ABNORMAL HIGH (ref 70–99)
Glucose-Capillary: 85 mg/dL (ref 70–99)
Glucose-Capillary: 92 mg/dL (ref 70–99)
Glucose-Capillary: 94 mg/dL (ref 70–99)
Glucose-Capillary: 99 mg/dL (ref 70–99)
Glucose-Capillary: 99 mg/dL (ref 70–99)
Glucose-Capillary: 106 mg/dL — ABNORMAL HIGH (ref 70–99)
Glucose-Capillary: 107 mg/dL — ABNORMAL HIGH (ref 70–99)
Glucose-Capillary: 111 mg/dL — ABNORMAL HIGH (ref 70–99)
Glucose-Capillary: 116 mg/dL — ABNORMAL HIGH (ref 70–99)
Glucose-Capillary: 117 mg/dL — ABNORMAL HIGH (ref 70–99)
Glucose-Capillary: 119 mg/dL — ABNORMAL HIGH (ref 70–99)
Glucose-Capillary: 87 mg/dL (ref 70–99)
Glucose-Capillary: 88 mg/dL (ref 70–99)

## 2011-03-29 LAB — BASIC METABOLIC PANEL
BUN: 12
CO2: 26
Calcium: 8.2 — ABNORMAL LOW
Chloride: 102
Creatinine, Ser: 0.8
GFR calc Af Amer: >60
GFR calc non Af Amer: >60
Glucose, Bld: 128 — ABNORMAL HIGH
Potassium: 3.9
Sodium: 134 — ABNORMAL LOW

## 2011-03-29 LAB — CBC
HCT: 35.3 — ABNORMAL LOW
HCT: 38.2
Hemoglobin: 11.8 — ABNORMAL LOW
Hemoglobin: 12.6
MCHC: 33.1
MCHC: 33.4
MCV: 84.9
MCV: 86.2
Platelets: 168
Platelets: 178
RBC: 4.16
RBC: 4.43
RDW: 15.5
RDW: 15.9 — ABNORMAL HIGH
WBC: 5.3
WBC: 5.5

## 2011-03-29 LAB — GLUCOSE, CAPILLARY
Glucose-Capillary: 135 — ABNORMAL HIGH
Glucose-Capillary: 159 — ABNORMAL HIGH

## 2011-03-29 LAB — CARDIAC PANEL(CRET KIN+CKTOT+MB+TROPI)
CK, MB: 2.1
Relative Index: INVALID
Total CK: 54
Troponin I: 0.05

## 2011-03-30 LAB — GLUCOSE, CAPILLARY
Glucose-Capillary: 103 mg/dL — ABNORMAL HIGH (ref 70–99)
Glucose-Capillary: 124 mg/dL — ABNORMAL HIGH (ref 70–99)
Glucose-Capillary: 127 mg/dL — ABNORMAL HIGH (ref 70–99)
Glucose-Capillary: 150 mg/dL — ABNORMAL HIGH (ref 70–99)
Glucose-Capillary: 165 mg/dL — ABNORMAL HIGH (ref 70–99)
Glucose-Capillary: 187 mg/dL — ABNORMAL HIGH (ref 70–99)

## 2011-04-14 ENCOUNTER — Ambulatory Visit (INDEPENDENT_AMBULATORY_CARE_PROVIDER_SITE_OTHER): Payer: Medicare Other | Admitting: Internal Medicine

## 2011-04-14 ENCOUNTER — Encounter: Payer: Self-pay | Admitting: Internal Medicine

## 2011-04-14 VITALS — BP 135/69 | HR 86 | Wt 137.0 lb

## 2011-04-14 DIAGNOSIS — I2510 Atherosclerotic heart disease of native coronary artery without angina pectoris: Secondary | ICD-10-CM

## 2011-04-14 DIAGNOSIS — I10 Essential (primary) hypertension: Secondary | ICD-10-CM

## 2011-04-14 DIAGNOSIS — E785 Hyperlipidemia, unspecified: Secondary | ICD-10-CM

## 2011-04-14 NOTE — Progress Notes (Addendum)
HPI patient is a 77 year old with a history of CAD (s/p IWMI with stent ot RCA. repeat stnet to the RCA for instent restenois. She is also s/p DES to L main. Admitted this summer for chst pain. Cath showed 40% LAD lesion; 50% Lcx.  SMall OM1 with 80% lesion; RCA with 20% instent restenosis.   Since I saw her she has done well from a cardiac standpoint.  She denies CP.  Breathing is OK.   She is having significant problems with colonic stricture with severe spells of abdomenal pain.  Being evaluated for surgery. Allergies  Allergen Reactions  . Lopressor Hct     REACTION: decreased bp  . Penicillins     REACTION: hives  . Sulfonamide Derivatives     REACTION: hives  . Titanium     Cervical plate - had to change for stainless steel    Current Outpatient Prescriptions  Medication Sig Dispense Refill  . aspirin 325 MG tablet Take 325 mg by mouth 2 (two) times daily.       [name] esomeprazole (NEXIUM) 40 MG capsule Take 1 capsule (40 mg total) by mouth daily before breakfast.  30 capsule  6  . guaiFENesin (MUCINEX) 600 MG 12 hr tablet Take 1,200 mg by mouth as needed.        [name] imipramine (TOFRANIL) 25 MG tablet       . imipramine (TOFRANIL-PM) 75 MG capsule Take 75 mg by mouth at bedtime. 3 before bed       . insulin glargine (LANTUS) 100 UNIT/ML injection Inject into the skin at bedtime. 8-12 untis       . levothyroxine (SYNTHROID, LEVOTHROID) 125 MCG tablet Take 125 mcg by mouth daily.        . nitroGLYCERIN (NITROSTAT) 0.4 MG SL tablet Place 0.4 mg under the tongue every 5 (five) minutes as needed.        . predniSONE (DELTASONE) 5 MG tablet       . senna (SENOKOT) 8.6 MG tablet Take 1 tablet by mouth as needed.        . ticlopidine (TICLID) 250 MG tablet Take 250 mg by mouth 2 (two) times daily.        . triazolam (HALCION) 0.25 MG tablet Take 0.25 mg by mouth at bedtime as needed.          Past Medical History  Diagnosis Date  . Coronary artery disease   . Dressler's syndrome   .  Hypertension   . Dyslipidemia   . Diabetes mellitus   . CVA (cerebral infarction) 1991  . Macular degeneration   . Depression   . GERD (gastroesophageal reflux disease)   . Diverticulosis   . IBS (irritable bowel syndrome)   . Zenker's diverticulum   . Emphysema   . Pulmonary hypertension   . Chronic sinusitis   . Fibromyalgia   . Hypothyroidism   . Scleroderma     followed by Dr. Dareen Piano  . Sjogren's disease   . Raynaud's phenomenon   . Cervical disc disease   . Adenomatous colon polyp   . Scleroderma   . COPD (chronic obstructive pulmonary disease)   . Arthritis   . Blood transfusion   . Myocardial infarction may 27th 2007    Past Surgical History  Procedure Date  . Total abdominal hysterectomy 1973  . Bilateral oophorectomy 2002  . Spinal fusion 1997    and implantation of a plate   . Lobe thyroid removal   .  Thyroid lobectomy 1991    removal of left lobe thyroid  . Cholecystectomy 1965  . Appendectomy   . Cataract extraction   . Bronchoscopy 02-22-09  . Stent surgery     x3    Family History  Problem Relation Age of Onset  . Other Mother 38    Died from sepsis  . Stroke Father 72    died    History   Social History  . Marital Status: Divorced    Spouse Name: N/A    Number of Children: N/A  . Years of Education: N/A   Occupational History  . retired    Social History Main Topics  . Smoking status: Former Smoker -- 1.0 packs/day for 40 years    Types: Cigarettes    Quit date: 06/26/1988  . Smokeless tobacco: Not on file   Comment: 40 pack years  . Alcohol Use: No  . Drug Use: No  . Sexually Active: Not on file   Other Topics Concern  . Not on file   Social History Narrative  . No narrative on file    Review of Systems:  All systems reviewed.  They are negative to the above problem except as previously stated.  Vital Signs: BP 135/69  Pulse 86  Wt 137 lb (62.143 kg)  Physical Exam Patient is in NAD  HEENT:  Normocephalic,  atraumatic. EOMI, PERRLA.  Neck: JVP is normal. No thyromegaly. No bruits.  Lungs: clear to auscultation. No rales no wheezes.  Heart: Regular rate and rhythm. Normal S1, S2. No S3.   No significant murmurs. PMI not displaced.  Abdomen:  Supple, nontender. Normal bowel sounds. No masses. No hepatomegaly.  Extremities:   Good distal pulses throughout. No lower extremity edema.  Musculoskeletal :moving all extremities.  Neuro:   alert and oriented x3.  CN II-XII grossly intact.   EKG:  Sinus rhythm.  T wave inversion I,II, AVF, V3 to V6.  Assessment and Plan:

## 2011-04-14 NOTE — Patient Instructions (Signed)
Your physician wants you to follow-up in:  6 months. You will receive a reminder letter in the mail two months in advance. If you don't receive a letter, please call our office to schedule the follow-up appointment.   

## 2011-04-16 NOTE — Progress Notes (Signed)
   Patient ID: [name], female    DOB: 03/08/1931, 77 y.o.   MRN: [id number]  HPI    Review of Systems    Physical Exam

## 2011-04-16 NOTE — Assessment & Plan Note (Signed)
Patient has not tolerated medical Rx.  Watchdiet.

## 2011-04-16 NOTE — Assessment & Plan Note (Signed)
Good control

## 2011-04-16 NOTE — Assessment & Plan Note (Signed)
Patient is doing well from a cardiac standpoint.  I think is is a low risk for a major cardiac complication with abdomenal surgery.  OK to proceed without furhter testing.  Stop Ticlid prior.  Resume after.  Continue aspirin.

## 2011-04-28 ENCOUNTER — Telehealth: Payer: Self-pay | Admitting: Pulmonary Disease

## 2011-04-28 ENCOUNTER — Telehealth (INDEPENDENT_AMBULATORY_CARE_PROVIDER_SITE_OTHER): Payer: Self-pay

## 2011-04-28 NOTE — Telephone Encounter (Signed)
[facility] office is closed will need to call back Monday morning

## 2011-04-28 NOTE — Telephone Encounter (Signed)
Left message with triage about the status of pt's pulmonary clearance. They will leave message for the nurse to call me back.Hulda Humphrey

## 2011-05-01 ENCOUNTER — Telehealth (INDEPENDENT_AMBULATORY_CARE_PROVIDER_SITE_OTHER): Payer: Self-pay

## 2011-05-01 NOTE — Telephone Encounter (Signed)
[name] returned my call about the pulmonary clearance and they said Dr Craige Cotta never received clearance their office asked if we would refax the request. I refaxed the request to the fax #[phone]/ [facility]

## 2011-05-01 NOTE — Telephone Encounter (Signed)
Fax has been given to [facility] to review.

## 2011-05-01 NOTE — Telephone Encounter (Signed)
[id number] with Helmut Muster. Per Mindy she looked through Dr. Silverio Lay paperwork and did not see any on this pt. I asked Helmut Muster to fax to triage fax and we will get it to Dr. Craige Cotta. Will await call-back or fax. Carron Curie, CMA

## 2011-05-03 ENCOUNTER — Encounter: Payer: Self-pay | Admitting: Pulmonary Disease

## 2011-05-05 NOTE — Telephone Encounter (Signed)
I spoke with [name] and she states they did receive fax and nothing further was needed

## 2011-05-08 ENCOUNTER — Other Ambulatory Visit (INDEPENDENT_AMBULATORY_CARE_PROVIDER_SITE_OTHER): Payer: Self-pay | Admitting: Surgery

## 2011-05-28 ENCOUNTER — Encounter (HOSPITAL_COMMUNITY): Payer: Self-pay | Admitting: *Deleted

## 2011-05-28 ENCOUNTER — Inpatient Hospital Stay (HOSPITAL_COMMUNITY)
Admission: EM | Admit: 2011-05-28 | Discharge: 2011-06-01 | DRG: 194 | Disposition: A | Discharge status: Home / Self Care | Payer: Medicare Other | Attending: Internal Medicine | Admitting: Internal Medicine

## 2011-05-28 ENCOUNTER — Emergency Department (HOSPITAL_COMMUNITY): Payer: Medicare Other

## 2011-05-28 DIAGNOSIS — M129 Arthropathy, unspecified: Secondary | ICD-10-CM | POA: Diagnosis present

## 2011-05-28 DIAGNOSIS — Z794 Long term (current) use of insulin: Secondary | ICD-10-CM

## 2011-05-28 DIAGNOSIS — F3289 Other specified depressive episodes: Secondary | ICD-10-CM | POA: Diagnosis present

## 2011-05-28 DIAGNOSIS — M3500 Sicca syndrome, unspecified: Secondary | ICD-10-CM | POA: Diagnosis present

## 2011-05-28 DIAGNOSIS — E039 Hypothyroidism, unspecified: Secondary | ICD-10-CM | POA: Diagnosis present

## 2011-05-28 DIAGNOSIS — K589 Irritable bowel syndrome without diarrhea: Secondary | ICD-10-CM | POA: Diagnosis present

## 2011-05-28 DIAGNOSIS — M349 Systemic sclerosis, unspecified: Secondary | ICD-10-CM | POA: Diagnosis present

## 2011-05-28 DIAGNOSIS — Z8673 Personal history of transient ischemic attack (TIA), and cerebral infarction without residual deficits: Secondary | ICD-10-CM

## 2011-05-28 DIAGNOSIS — R509 Fever, unspecified: Secondary | ICD-10-CM

## 2011-05-28 DIAGNOSIS — I7300 Raynaud's syndrome without gangrene: Secondary | ICD-10-CM | POA: Diagnosis present

## 2011-05-28 DIAGNOSIS — E119 Type 2 diabetes mellitus without complications: Secondary | ICD-10-CM | POA: Diagnosis present

## 2011-05-28 DIAGNOSIS — H3530 Unspecified macular degeneration: Secondary | ICD-10-CM | POA: Diagnosis present

## 2011-05-28 DIAGNOSIS — H543 Unqualified visual loss, both eyes: Secondary | ICD-10-CM | POA: Diagnosis present

## 2011-05-28 DIAGNOSIS — K219 Gastro-esophageal reflux disease without esophagitis: Secondary | ICD-10-CM | POA: Diagnosis present

## 2011-05-28 DIAGNOSIS — I10 Essential (primary) hypertension: Secondary | ICD-10-CM | POA: Diagnosis present

## 2011-05-28 DIAGNOSIS — I252 Old myocardial infarction: Secondary | ICD-10-CM

## 2011-05-28 DIAGNOSIS — B370 Candidal stomatitis: Secondary | ICD-10-CM | POA: Diagnosis not present

## 2011-05-28 DIAGNOSIS — J189 Pneumonia, unspecified organism: Secondary | ICD-10-CM | POA: Diagnosis present

## 2011-05-28 DIAGNOSIS — D72829 Elevated white blood cell count, unspecified: Secondary | ICD-10-CM | POA: Diagnosis present

## 2011-05-28 DIAGNOSIS — J449 Chronic obstructive pulmonary disease, unspecified: Secondary | ICD-10-CM

## 2011-05-28 DIAGNOSIS — Z79899 Other long term (current) drug therapy: Secondary | ICD-10-CM

## 2011-05-28 DIAGNOSIS — J329 Chronic sinusitis, unspecified: Secondary | ICD-10-CM | POA: Diagnosis present

## 2011-05-28 DIAGNOSIS — K838 Other specified diseases of biliary tract: Secondary | ICD-10-CM

## 2011-05-28 DIAGNOSIS — E876 Hypokalemia: Secondary | ICD-10-CM | POA: Diagnosis present

## 2011-05-28 DIAGNOSIS — E785 Hyperlipidemia, unspecified: Secondary | ICD-10-CM | POA: Diagnosis present

## 2011-05-28 DIAGNOSIS — I2789 Other specified pulmonary heart diseases: Secondary | ICD-10-CM | POA: Diagnosis present

## 2011-05-28 DIAGNOSIS — Z981 Arthrodesis status: Secondary | ICD-10-CM

## 2011-05-28 DIAGNOSIS — F329 Major depressive disorder, single episode, unspecified: Secondary | ICD-10-CM | POA: Diagnosis present

## 2011-05-28 DIAGNOSIS — IMO0001 Reserved for inherently not codable concepts without codable children: Secondary | ICD-10-CM | POA: Diagnosis present

## 2011-05-28 DIAGNOSIS — Z7982 Long term (current) use of aspirin: Secondary | ICD-10-CM

## 2011-05-28 DIAGNOSIS — J438 Other emphysema: Secondary | ICD-10-CM | POA: Diagnosis present

## 2011-05-28 DIAGNOSIS — IMO0002 Reserved for concepts with insufficient information to code with codable children: Secondary | ICD-10-CM

## 2011-05-28 DIAGNOSIS — I2510 Atherosclerotic heart disease of native coronary artery without angina pectoris: Secondary | ICD-10-CM | POA: Diagnosis present

## 2011-05-28 LAB — URINALYSIS, ROUTINE W REFLEX MICROSCOPIC
Bilirubin Urine: NEGATIVE
Glucose, UA: NEGATIVE mg/dL
Hgb urine dipstick: NEGATIVE
Ketones, ur: NEGATIVE mg/dL
Leukocytes, UA: NEGATIVE
Nitrite: NEGATIVE
Protein, ur: NEGATIVE mg/dL
Specific Gravity, Urine: 1.011 (ref 1.005–1.030)
Urobilinogen, UA: 0.2 mg/dL (ref 0.0–1.0)
pH: 7 (ref 5.0–8.0)

## 2011-05-28 LAB — COMPREHENSIVE METABOLIC PANEL
ALT: 14 U/L (ref 0–35)
AST: 20 U/L (ref 0–37)
Albumin: 3.3 g/dL — ABNORMAL LOW (ref 3.5–5.2)
Alkaline Phosphatase: 64 U/L (ref 39–117)
BUN: 18 mg/dL (ref 6–23)
CO2: 26 mEq/L (ref 19–32)
Calcium: 8.2 mg/dL — ABNORMAL LOW (ref 8.4–10.5)
Chloride: 97 mEq/L (ref 96–112)
Creatinine, Ser: 0.78 mg/dL (ref 0.50–1.10)
GFR calc Af Amer: >90 mL/min (ref >90)
GFR calc non Af Amer: 78 mL/min — ABNORMAL LOW (ref >90)
Glucose, Bld: 134 mg/dL — ABNORMAL HIGH (ref 70–99)
Potassium: 2.7 mEq/L — CL (ref 3.5–5.1)
Sodium: 135 mEq/L (ref 135–145)
Total Bilirubin: 0.3 mg/dL (ref 0.3–1.2)
Total Protein: 6.4 g/dL (ref 6.0–8.3)

## 2011-05-28 LAB — CBC
HCT: 34.2 % — ABNORMAL LOW (ref 36.0–46.0)
Hemoglobin: 11.1 g/dL — ABNORMAL LOW (ref 12.0–15.0)
MCH: 26.1 pg (ref 26.0–34.0)
MCHC: 32.5 g/dL (ref 30.0–36.0)
MCV: 80.5 fL (ref 78.0–100.0)
Platelets: 194 10*3/uL (ref 150–400)
RBC: 4.25 MIL/uL (ref 3.87–5.11)
RDW: 13.4 % (ref 11.5–15.5)
WBC: 15.8 10*3/uL — ABNORMAL HIGH (ref 4.0–10.5)

## 2011-05-28 LAB — DIFFERENTIAL
Basophils Absolute: 0 10*3/uL (ref 0.0–0.1)
Basophils Relative: 0 % (ref 0–1)
Eosinophils Absolute: 0.1 10*3/uL (ref 0.0–0.7)
Eosinophils Relative: 0 % (ref 0–5)
Lymphocytes Relative: 7 % — ABNORMAL LOW (ref 12–46)
Lymphs Abs: 1.2 10*3/uL (ref 0.7–4.0)
Monocytes Absolute: 1 10*3/uL (ref 0.1–1.0)
Monocytes Relative: 7 % (ref 3–12)
Neutro Abs: 13.5 10*3/uL — ABNORMAL HIGH (ref 1.7–7.7)
Neutrophils Relative %: 86 % — ABNORMAL HIGH (ref 43–77)

## 2011-05-28 LAB — GLUCOSE, CAPILLARY: Glucose-Capillary: 150 mg/dL — ABNORMAL HIGH (ref 70–99)

## 2011-05-28 LAB — MAGNESIUM: Magnesium: 1.3 mg/dL — ABNORMAL LOW (ref 1.5–2.5)

## 2011-05-28 MED ORDER — MAGNESIUM SULFATE 40 MG/ML IJ SOLN
2.0000 g | INTRAMUSCULAR | Status: AC
  Administered 2011-05-28 (×2): 2 g via INTRAVENOUS
  Filled 2011-05-28: qty 50

## 2011-05-28 MED ORDER — MOXIFLOXACIN HCL IN NACL 400 MG/250ML IV SOLN
400.0000 mg | INTRAVENOUS | Status: DC
  Administered 2011-05-28: 400 mg via INTRAVENOUS
  Filled 2011-05-28: qty 250

## 2011-05-28 MED ORDER — ONDANSETRON 4 MG PO TBDP
8.0000 mg | ORAL_TABLET | Freq: Once | ORAL | Status: AC
  Administered 2011-05-28: 8 mg via ORAL
  Filled 2011-05-28: qty 2

## 2011-05-28 MED ORDER — ONDANSETRON HCL 4 MG/2ML IJ SOLN
4.0000 mg | Freq: Once | INTRAMUSCULAR | Status: DC
  Filled 2011-05-28: qty 2

## 2011-05-28 MED ORDER — SODIUM CHLORIDE 0.9 % IV BOLUS (SEPSIS)
1000.0000 mL | Freq: Once | INTRAVENOUS | Status: AC
  Administered 2011-05-28: 1000 mL via INTRAVENOUS

## 2011-05-28 MED ORDER — POTASSIUM CHLORIDE CRYS ER 20 MEQ PO TBCR
40.0000 meq | EXTENDED_RELEASE_TABLET | Freq: Once | ORAL | Status: AC
  Administered 2011-05-28: 40 meq via ORAL
  Filled 2011-05-28: qty 2

## 2011-05-28 MED ORDER — POTASSIUM CHLORIDE 10 MEQ/100ML IV SOLN
10.0000 meq | Freq: Once | INTRAVENOUS | Status: AC
  Administered 2011-05-28: 10 meq via INTRAVENOUS
  Filled 2011-05-28: qty 100

## 2011-05-28 MED ORDER — HYDROCODONE-ACETAMINOPHEN 5-325 MG PO TABS
1.0000 | ORAL_TABLET | Freq: Once | ORAL | Status: AC
  Administered 2011-05-28: 1 via ORAL
  Filled 2011-05-28: qty 1

## 2011-05-28 MED ORDER — ACETAMINOPHEN 325 MG PO TABS
650.0000 mg | ORAL_TABLET | Freq: Once | ORAL | Status: AC
  Administered 2011-05-28: 650 mg via ORAL
  Filled 2011-05-28: qty 2

## 2011-05-28 NOTE — ED Notes (Signed)
IV team at bedside attempting to start iv.

## 2011-05-28 NOTE — H&P (Signed)
[name] is an 77 y.o. female.   Chief Complaint:  Fever for one day HPI:  This 77 year old female with a history of diabetes mellitus, scleroderma on the chronic steroid dose, COPD, history of  Boop in the past, patient presented to the ED today with generalized weakness and lethargy she found to have a temperature of 103 associated with chills or rigers, also coughing productive of whitish sputum denies any shortness of breath, she has a history of sinusitis and recently completed treatment with ceftin. Also patient complained of generalizedweakness and has 5 days history of acute on chronic back pain,Patient denies any problem with her back pain during sleep, denies any numbness or weakness of her extremities denies any radiation of heart back pain. on ED patient found to have a chest x-ray suggestive of acute pneumonia  Past Medical History  Diagnosis Date  . Coronary artery disease   . Dressler's syndrome   . Hypertension   . Dyslipidemia   . Diabetes mellitus   . CVA (cerebral infarction) 1991  . Macular degeneration   . Depression   . GERD (gastroesophageal reflux disease)   . Diverticulosis   . IBS (irritable bowel syndrome)   . Zenker's diverticulum   . Emphysema   . Pulmonary hypertension   . Chronic sinusitis   . Fibromyalgia   . Hypothyroidism   . Scleroderma     followed by Dr. Dareen Piano  . Sjogren's disease   . Raynaud's phenomenon   . Cervical disc disease   . Adenomatous colon polyp   . Scleroderma   . COPD (chronic obstructive pulmonary disease)   . Arthritis   . Blood transfusion   . Myocardial infarction may 27th 2007    Past Surgical History  Procedure Date  . Total abdominal hysterectomy 1973  . Bilateral oophorectomy 2002  . Spinal fusion 1997    and implantation of a plate   . Lobe thyroid removal   . Thyroid lobectomy 1991    removal of left lobe thyroid  . Cholecystectomy 1965  . Appendectomy   . Cataract extraction   . Bronchoscopy  02-22-09  . Stent surgery     x3    Family History  Problem Relation Age of Onset  . Other Mother 54    Died from sepsis  . Stroke Father 25    died   Social History: she quit smoking more than 10 years, she used to smoke 1 ppd for 30 years, no ETOH, NO DRUGS ABUSE  Allergies:  Allergies  Allergen Reactions  . Lopressor Hct     REACTION: decreased bp  . Penicillins     REACTION: hives  . Sulfonamide Derivatives     REACTION: hives  . Titanium     Cervical plate - had to change for stainless steel    Medications Prior to Admission  Medication Dose Route Frequency Provider Last Rate Last Dose  . acetaminophen (TYLENOL) tablet 650 mg  650 mg Oral Once Gwyneth Sprout, MD   650 mg at 05/28/11 1228  . HYDROcodone-acetaminophen (NORCO) 5-325 MG per tablet 1 tablet  1 tablet Oral Once [place], [place]   1 tablet at 05/28/11 1606  . magnesium sulfate IVPB 2 g 50 mL  2 g Intravenous STAT Lisette Paz, [place]   2 g at 05/28/11 1729  . moxifloxacin (AVELOX) IVPB 400 mg  400 mg Intravenous Q24H Lisette Paz, [place]      . ondansetron (ZOFRAN-ODT) disintegrating tablet 8 mg  8  mg Oral Once [place], [place]   8 mg at 05/28/11 1403  . potassium chloride 10 mEq in 100 mL IVPB  10 mEq Intravenous Once [employer], [place]   10 mEq at 05/28/11 1459  . potassium chloride SA (K-DUR,KLOR-CON) CR tablet 40 mEq  40 mEq Oral Once [employer], [place]   40 mEq at 05/28/11 1405  . sodium chloride 0.9 % bolus 1,000 mL  1,000 mL Intravenous Once Gwyneth Sprout, MD   1,000 mL at 05/28/11 1457  . DISCONTD: ondansetron (ZOFRAN) injection 4 mg  4 mg Intravenous Once Gwyneth Sprout, MD       Medications Prior to Admission  Medication Sig Dispense Refill  . aspirin 325 MG tablet Take 325 mg by mouth 2 (two) times daily.       [name] esomeprazole (NEXIUM) 40 MG capsule Take 1 capsule (40 mg total) by mouth daily before breakfast.  30 capsule  6  . imipramine (TOFRANIL) 25 MG tablet Take 75 mg by mouth at bedtime.       . insulin  glargine (LANTUS) 100 UNIT/ML injection Inject into the skin at bedtime. 8-12 untis       . levothyroxine (SYNTHROID, LEVOTHROID) 125 MCG tablet Take 125 mcg by mouth daily.       . predniSONE (DELTASONE) 5 MG tablet Take 5 mg by mouth daily.       . ticlopidine (TICLID) 250 MG tablet Take 250 mg by mouth 2 (two) times daily.       [name] guaiFENesin (MUCINEX) 600 MG 12 hr tablet Take 1,200 mg by mouth as needed.        . nitroGLYCERIN (NITROSTAT) 0.4 MG SL tablet Place 0.4 mg under the tongue every 5 (five) minutes as needed.        . senna (SENOKOT) 8.6 MG tablet Take 1 tablet by mouth daily as needed. As needed for constipation.      . triazolam (HALCION) 0.25 MG tablet Take 0.25 mg by mouth at bedtime as needed.          Results for orders placed during the hospital encounter of 05/28/11 (from the past 48 hour(s))  CBC     Status: Abnormal   Collection Time   05/28/11 11:31 AM      Component Value Range Comment   WBC 15.8 (*) 4.0 - 10.5 (K/uL)    RBC 4.25  3.87 - 5.11 (MIL/uL)    Hemoglobin 11.1 (*) 12.0 - 15.0 (g/dL)    HCT 16.1 (*) 09.6 - 46.0 (%)    MCV 80.5  78.0 - 100.0 (fL)    MCH 26.1  26.0 - 34.0 (pg)    MCHC 32.5  30.0 - 36.0 (g/dL)    RDW 04.5  40.9 - 81.1 (%)    Platelets 194  150 - 400 (K/uL)   DIFFERENTIAL     Status: Abnormal   Collection Time   05/28/11 11:31 AM      Component Value Range Comment   Neutrophils Relative 86 (*) 43 - 77 (%)    Neutro Abs 13.5 (*) 1.7 - 7.7 (K/uL)    Lymphocytes Relative 7 (*) 12 - 46 (%)    Lymphs Abs 1.2  0.7 - 4.0 (K/uL)    Monocytes Relative 7  3 - 12 (%)    Monocytes Absolute 1.0  0.1 - 1.0 (K/uL)    Eosinophils Relative 0  0 - 5 (%)    Eosinophils Absolute 0.1  0.0 - 0.7 (K/uL)  Basophils Relative 0  0 - 1 (%)    Basophils Absolute 0.0  0.0 - 0.1 (K/uL)   COMPREHENSIVE METABOLIC PANEL     Status: Abnormal   Collection Time   05/28/11 11:31 AM      Component Value Range Comment   Sodium 135  135 - 145 (mEq/L)    Potassium 2.7  (*) 3.5 - 5.1 (mEq/L)    Chloride 97  96 - 112 (mEq/L)    CO2 26  19 - 32 (mEq/L)    Glucose, Bld 134 (*) 70 - 99 (mg/dL)    BUN 18  6 - 23 (mg/dL)    Creatinine, Ser 4.09  0.50 - 1.10 (mg/dL)    Calcium 8.2 (*) 8.4 - 10.5 (mg/dL)    Total Protein 6.4  6.0 - 8.3 (g/dL)    Albumin 3.3 (*) 3.5 - 5.2 (g/dL)    AST 20  0 - 37 (U/L)    ALT 14  0 - 35 (U/L)    Alkaline Phosphatase 64  39 - 117 (U/L)    Total Bilirubin 0.3  0.3 - 1.2 (mg/dL)    GFR calc non Af Amer 78 (*) >90 (mL/min)    GFR calc Af Amer >90  >90 (mL/min)   URINALYSIS, ROUTINE W REFLEX MICROSCOPIC     Status: Normal   Collection Time   05/28/11 11:52 AM      Component Value Range Comment   Color, Urine YELLOW  YELLOW     APPearance CLEAR  CLEAR     Specific Gravity, Urine 1.011  1.005 - 1.030     pH 7.0  5.0 - 8.0     Glucose, UA NEGATIVE  NEGATIVE (mg/dL)    Hgb urine dipstick NEGATIVE  NEGATIVE     Bilirubin Urine NEGATIVE  NEGATIVE     Ketones, ur NEGATIVE  NEGATIVE (mg/dL)    Protein, ur NEGATIVE  NEGATIVE (mg/dL)    Urobilinogen, UA 0.2  0.0 - 1.0 (mg/dL)    Nitrite NEGATIVE  NEGATIVE     Leukocytes, UA NEGATIVE  NEGATIVE  MICROSCOPIC NOT DONE ON URINES WITH NEGATIVE PROTEIN, BLOOD, LEUKOCYTES, NITRITE, OR GLUCOSE <1000 mg/dL.  MAGNESIUM     Status: Abnormal   Collection Time   05/28/11  1:05 PM      Component Value Range Comment   Magnesium 1.3 (*) 1.5 - 2.5 (mg/dL)    Dg Chest 2 View  81/06/9145  *RADIOLOGY REPORT*  Clinical Data: Fever and shortness of breath.  CHEST - 2 VIEW  Comparison: 03/13/2011  Findings: Two views of the chest demonstrate new airspace densities in the right mid and lower lung regions. The disease may be involving more than one pulmonary lobe.  Left lung is clear.  Heart mediastinum are within normal limits.  Trachea is midline.  IMPRESSION: New airspace densities in the right lung.  Findings are most compatible with multifocal pneumonia.  Recommend follow-up chest radiographs to ensure  resolution.  Original Report Authenticated By: Richarda Overlie, M.D.    Review of Systems  Constitutional: Positive for fever, chills, malaise/fatigue and diaphoresis. Negative for weight loss.  HENT: Negative for hearing loss, ear pain, congestion, sore throat, tinnitus and ear discharge.   Eyes: Negative for blurred vision, photophobia and discharge.  Respiratory: Positive for cough and sputum production. Negative for hemoptysis, shortness of breath, wheezing and stridor.   Cardiovascular: Negative for chest pain, palpitations and orthopnea.  Gastrointestinal: Negative for nausea, vomiting, abdominal pain, diarrhea and constipation.  Genitourinary: Negative for dysuria, urgency, frequency and hematuria.  Musculoskeletal: Positive for back pain. Negative for joint pain and falls.  Skin: Negative for itching and rash.  Neurological: Positive for dizziness and weakness. Negative for tingling, tremors, sensory change, speech change and headaches.    Blood pressure 109/52, pulse 76, temperature 99.8 F (37.7 C), temperature source Oral, resp. rate 13, SpO2 97.00%. Physical Exam  Patient lying comfortably on bed not on respiratory distress or shortness of breath  Neck supple no adenopathy, pupil equal reactive to light and accommodation extraocular muscle movement within normal  Lungs examination normal vesicular breathing with equal air entry  Abdomen soft nontender bowel sounds positive  Extremity without lower limb edema peripheral pulses intact  Back there is no area of point tenderness and the patient moves allextremities Assessment/Plan 1-fever likely secondary to acute community-acquired pneumonia patient would be treated with Levaquin IV, septic workup , including urine for Legionella and streptococcal Repeat chest x-ray within the next 24-48 hours. Make sure there is no progression of the pneumonia with a history of BOOP in the past 2-hypokalemia replacement causes not  clear 3-hypomagnesemia replacement  4-leukocytosis secondary to underlying infection, patient has back pain but she has a history of chronic back pain, examination there is no worrying signs I would check ESR and C-reactive protein and if the patient continued to complain of back pain consider MRI. Currently underlying acute morphology of the back is less likely  Resume home medication  Eri Mcevers I. 05/28/2011, 6:31 PM

## 2011-05-28 NOTE — ED Provider Notes (Signed)
I saw pt initially and then continued care in the CDU by PA  Gwyneth Sprout, MD 05/28/11 1949

## 2011-05-28 NOTE — ED Notes (Signed)
Pt difficult stick, and screaming while trying to start iv. Attempted iv  X 2 without success. IV team paged for access. Daughter states that pt normally has to have versed prior to iv starts.

## 2011-05-28 NOTE — ED Notes (Signed)
Report to [facility] in the CDU

## 2011-05-28 NOTE — ED Provider Notes (Signed)
12:00 PM Care resumed from Dr. Anitra Lauth.  Patient presented with fever of 103 and cough.  Upon arrival to the CDU given her Tylenol.  Of note patient has a history of scleroderma with crest, type 2 diabetes, CAD, hypertension, and COPD.  And is to rule out urinary tract infection and pneumonia as possible cause of fever.  Depending on lab results will be whether or not the patient will be admitted.  Will rediscuss the case with Dr. Anitra Lauth when all results are and.  1:10 PM IV has not yet been started due to difficulty.  The IV team has been contacted.  The patient's labs have resulted that she has a low magnesium at 1.3 and a very low potassium at 2.7.  I have ordered IV potassium and magnesium.  In addition the patient is to receive a liter bolus of fluids to help with her fever and tachycardia.  2:20 PM Patient has just returned from x-ray.  The IV team has been paged again.  The patient has just received her oral potassium and will recheck her vitals including temperature and 20 minutes and the patient has been instructed not to drink any cold water.  The patient is in no apparent distress, denies headache, chest pain, and shortness of breath.  3:37 PM  IMPRESSION: New airspace densities in the right lung. Findings are most compatible with multifocal pneumonia. Recommend follow-up chest radiographs to ensure resolution.  Original Report Authenticated By: Richarda Overlie, M.D.  D/t above results pt started on [facility] & Internal medicine paged to admit  PCP is Dr. Linward Natal Pulmonologist is Dr. Craige Cotta with [facility]  Admit Inpt; hypokalemia,PNA; Dr.Elsaid [facility] Team #6; telemetry bed.    [place], [place] 05/28/11 [phone]

## 2011-05-28 NOTE — ED Notes (Signed)
Patient presents to ed via [facility] states she didn't feel well yest. Was very tired. Today started with fever and chills very thirsty. Denies n/v/d states she has been caring for her great-grandchildren who have also been sick with fever. Alert oriented .

## 2011-05-28 NOTE — ED Provider Notes (Signed)
History     CSN: [id number] Arrival date & time: 05/28/2011 10:47 AM   First MD Initiated Contact with Patient 05/28/11 1130      Chief Complaint  Patient presents with  . Fever    (Consider location/radiation/quality/duration/timing/severity/associated sxs/prior treatment) Patient is a 77 y.o. female presenting with fever. The history is provided by the patient and a relative.  Fever Primary symptoms of the febrile illness include fever, headaches, cough, shortness of breath and myalgias. Primary symptoms do not include wheezing, abdominal pain, nausea, vomiting, diarrhea, dysuria, altered mental status or rash. The current episode started today. This is a new problem. The problem has not changed since onset. The fever began today. The fever has been unchanged since its onset. The maximum temperature recorded prior to her arrival was 103 to 104 F. The temperature was taken by an oral thermometer.  The headache began today (Started when the fever began). The headache developed gradually. Headache is a new problem. The headache is present continuously. The pain from the headache is at a severity of 4/10. The headache is associated with photophobia. The headache is not associated with double vision, stiff neck, weakness or loss of balance.  The cough began more than 1 week ago. The cough is recurrent. The cough is productive. The sputum is clear.  The myalgias are not associated with weakness.  Associated with: Positive sick contacts the last few weeks. Risk factors: History of scleroderma and crest, COPD.   Past Medical History  Diagnosis Date  . Coronary artery disease   . Dressler's syndrome   . Hypertension   . Dyslipidemia   . Diabetes mellitus   . CVA (cerebral infarction) 1991  . Macular degeneration   . Depression   . GERD (gastroesophageal reflux disease)   . Diverticulosis   . IBS (irritable bowel syndrome)   . Zenker's diverticulum   . Emphysema   . Pulmonary  hypertension   . Chronic sinusitis   . Fibromyalgia   . Hypothyroidism   . Scleroderma     followed by Dr. Dareen Piano  . Sjogren's disease   . Raynaud's phenomenon   . Cervical disc disease   . Adenomatous colon polyp   . Scleroderma   . COPD (chronic obstructive pulmonary disease)   . Arthritis   . Blood transfusion   . Myocardial infarction may 27th 2007    Past Surgical History  Procedure Date  . Total abdominal hysterectomy 1973  . Bilateral oophorectomy 2002  . Spinal fusion 1997    and implantation of a plate   . Lobe thyroid removal   . Thyroid lobectomy 1991    removal of left lobe thyroid  . Cholecystectomy 1965  . Appendectomy   . Cataract extraction   . Bronchoscopy 02-22-09  . Stent surgery     x3    Family History  Problem Relation Age of Onset  . Other Mother 44    Died from sepsis  . Stroke Father 82    died    History  Substance Use Topics  . Smoking status: Former Smoker -- 1.0 packs/day for 40 years    Types: Cigarettes    Quit date: 06/26/1988  . Smokeless tobacco: Not on file   Comment: 40 pack years  . Alcohol Use: No    OB History    Grav Para Term Preterm Abortions TAB SAB Ect Mult Living                  Review  of Systems  Constitutional: Positive for fever and chills.  HENT: Positive for postnasal drip. Negative for ear pain, sore throat and trouble swallowing.   Eyes: Positive for photophobia. Negative for double vision.  Respiratory: Positive for cough and shortness of breath. Negative for chest tightness and wheezing.   Cardiovascular: Negative for chest pain.  Gastrointestinal: Negative for nausea, vomiting, abdominal pain and diarrhea.  Genitourinary: Negative for dysuria.  Musculoskeletal: Positive for myalgias.  Skin: Negative for rash.  Neurological: Positive for headaches. Negative for weakness and loss of balance.  Psychiatric/Behavioral: Negative for confusion and altered mental status.  All other systems reviewed  and are negative.    Allergies  Lopressor hct; Penicillins; Sulfonamide derivatives; and Titanium  Home Medications   Current Outpatient Rx  Name Route Sig Dispense Refill  . ASPIRIN 325 MG PO TABS Oral Take 325 mg by mouth 2 (two) times daily.     [name] ESOMEPRAZOLE MAGNESIUM 40 MG PO CPDR Oral Take 1 capsule (40 mg total) by mouth daily before breakfast. 30 capsule 6  . GUAIFENESIN ER 600 MG PO TB12 Oral Take 1,200 mg by mouth as needed.      . IMIPRAMINE HCL 25 MG PO TABS      . IMIPRAMINE PAMOATE 75 MG PO CAPS Oral Take 75 mg by mouth at bedtime. 3 before bed     . INSULIN GLARGINE 100 UNIT/ML [place] SOLN Subcutaneous Inject into the skin at bedtime. 8-12 untis     . LEVOTHYROXINE SODIUM 125 MCG PO TABS Oral Take 125 mcg by mouth daily.      [name] NITROGLYCERIN 0.4 MG SL SUBL Sublingual Place 0.4 mg under the tongue every 5 (five) minutes as needed.      [name] PREDNISONE 5 MG PO TABS      . SENNOSIDES 8.6 MG PO TABS Oral Take 1 tablet by mouth as needed.      [name] TICLOPIDINE HCL 250 MG PO TABS Oral Take 250 mg by mouth 2 (two) times daily.      [name] TRIAZOLAM 0.25 MG PO TABS Oral Take 0.25 mg by mouth at bedtime as needed.        BP 137/45  Pulse 100  Temp(Src) 101 F (38.3 C) (Oral)  Resp 20  SpO2 92%  Physical Exam  Nursing note and vitals reviewed. Constitutional: She is oriented to person, place, and time. She appears well-developed and well-nourished. No distress.  HENT:  Head: Normocephalic and atraumatic.  Mouth/Throat: Oropharynx is clear and moist.  Eyes: Conjunctivae and EOM are normal. Pupils are equal, round, and reactive to light.  Neck: Normal range of motion. Neck supple.  Cardiovascular: Normal rate, regular rhythm, normal heart sounds and intact distal pulses.  Exam reveals no friction rub.   No murmur heard. Pulmonary/Chest: Effort normal. She has no wheezes. She has rhonchi in the right middle field and the right lower field. She has no rales.  Abdominal: Soft. Bowel  sounds are normal. She exhibits no distension. There is no tenderness. There is no rebound and no guarding.  Musculoskeletal: Normal range of motion. She exhibits no tenderness.       No edema  Lymphadenopathy:    She has no cervical adenopathy.  Neurological: She is alert and oriented to person, place, and time. No cranial nerve deficit.  Skin: Skin is warm and dry. No rash noted.  Psychiatric: She has a normal mood and affect. Her behavior is normal.    ED Course  Procedures (including critical  care time)   Labs Reviewed  CBC  DIFFERENTIAL  COMPREHENSIVE METABOLIC PANEL  URINALYSIS, ROUTINE W REFLEX MICROSCOPIC  URINE CULTURE   No results found.   No diagnosis found.    MDM   Patient with a history of COPD, scleroderma and crest who presents today with fever of 103, nonproductive cough, and shortness of breath. Patient states she's had a sinus infection for the last 2 weeks but did not develop a bad coughing until the last few days and then fever today. Patient satting 92% on room air and has a temperature of 101 here. Rhonchi on the right side of the lungs. No abdominal pain or GI symptoms. Denies any urinary symptoms.  Patient is chronically on 5 mg of prednisone. It has not recently been tapered or weaned. Will check CBC, CMP, a UA, chest x-ray. Will give IV fluids and acetaminophen. We'll move to an exam room.      Gwyneth Sprout, MD 05/28/11 1214

## 2011-05-29 ENCOUNTER — Encounter (HOSPITAL_COMMUNITY): Payer: Self-pay | Admitting: Lactation Services

## 2011-05-29 LAB — URINE CULTURE
Colony Count: 35000
Culture  Setup Time: 201212021805

## 2011-05-29 LAB — HEMOGLOBIN A1C
Hgb A1c MFr Bld: 6.7 % — ABNORMAL HIGH (ref <5.7)
Mean Plasma Glucose: 146 mg/dL — ABNORMAL HIGH (ref <117)

## 2011-05-29 LAB — GLUCOSE, CAPILLARY
Glucose-Capillary: 118 mg/dL — ABNORMAL HIGH (ref 70–99)
Glucose-Capillary: 182 mg/dL — ABNORMAL HIGH (ref 70–99)
Glucose-Capillary: 152 mg/dL — ABNORMAL HIGH (ref 70–99)
Glucose-Capillary: 167 mg/dL — ABNORMAL HIGH (ref 70–99)

## 2011-05-29 LAB — BASIC METABOLIC PANEL
BUN: 17 mg/dL (ref 6–23)
CO2: 27 mEq/L (ref 19–32)
Calcium: 7.5 mg/dL — ABNORMAL LOW (ref 8.4–10.5)
Chloride: 95 mEq/L — ABNORMAL LOW (ref 96–112)
Creatinine, Ser: 0.85 mg/dL (ref 0.50–1.10)
GFR calc Af Amer: 74 mL/min — ABNORMAL LOW (ref >90)
GFR calc non Af Amer: 64 mL/min — ABNORMAL LOW (ref >90)
Glucose, Bld: 186 mg/dL — ABNORMAL HIGH (ref 70–99)
Potassium: 4 mEq/L (ref 3.5–5.1)
Sodium: 130 mEq/L — ABNORMAL LOW (ref 135–145)

## 2011-05-29 LAB — CARDIAC PANEL(CRET KIN+CKTOT+MB+TROPI)
CK, MB: 4.3 ng/mL — ABNORMAL HIGH (ref 0.3–4.0)
CK, MB: 4.5 ng/mL — ABNORMAL HIGH (ref 0.3–4.0)
CK, MB: 3.8 ng/mL (ref 0.3–4.0)
Relative Index: 2.8 — ABNORMAL HIGH (ref 0.0–2.5)
Relative Index: 3 — ABNORMAL HIGH (ref 0.0–2.5)
Relative Index: 2.9 — ABNORMAL HIGH (ref 0.0–2.5)
Total CK: 156 U/L (ref 7–177)
Total CK: 151 U/L (ref 7–177)
Total CK: 133 U/L (ref 7–177)
Troponin I: <0.3 ng/mL (ref <0.30)
Troponin I: <0.3 ng/mL (ref <0.30)
Troponin I: <0.3 ng/mL (ref <0.30)

## 2011-05-29 LAB — RAPID STREP SCREEN (MED CTR MEBANE ONLY): Streptococcus, Group A Screen (Direct): NEGATIVE

## 2011-05-29 LAB — MAGNESIUM: Magnesium: 1.8 mg/dL (ref 1.5–2.5)

## 2011-05-29 LAB — SEDIMENTATION RATE: Sed Rate: 122 mm/hr — ABNORMAL HIGH (ref 0–22)

## 2011-05-29 LAB — C-REACTIVE PROTEIN: CRP: 10.47 mg/dL — ABNORMAL HIGH (ref <0.60)

## 2011-05-29 MED ORDER — PREDNISONE 5 MG PO TABS
5.0000 mg | ORAL_TABLET | Freq: Every day | ORAL | Status: DC
  Administered 2011-05-29 – 2011-06-01 (×4): 5 mg via ORAL
  Filled 2011-05-29 (×4): qty 1

## 2011-05-29 MED ORDER — IMIPRAMINE HCL 50 MG PO TABS
75.0000 mg | ORAL_TABLET | Freq: Every day | ORAL | Status: DC
  Administered 2011-05-29 – 2011-05-31 (×4): 75 mg via ORAL
  Filled 2011-05-29 (×5): qty 1

## 2011-05-29 MED ORDER — HYDROCODONE-ACETAMINOPHEN 5-325 MG PO TABS
1.0000 | ORAL_TABLET | Freq: Once | ORAL | Status: AC
  Administered 2011-05-29: 1 via ORAL
  Filled 2011-05-29: qty 1

## 2011-05-29 MED ORDER — PANTOPRAZOLE SODIUM 40 MG PO TBEC
40.0000 mg | DELAYED_RELEASE_TABLET | Freq: Every day | ORAL | Status: DC
  Administered 2011-05-29 – 2011-06-01 (×4): 40 mg via ORAL
  Filled 2011-05-29 (×4): qty 1

## 2011-05-29 MED ORDER — ZOLPIDEM TARTRATE 5 MG PO TABS
5.0000 mg | ORAL_TABLET | Freq: Every evening | ORAL | Status: DC | PRN
  Administered 2011-05-29 (×2): 5 mg via ORAL
  Filled 2011-05-29 (×2): qty 1

## 2011-05-29 MED ORDER — GUAIFENESIN ER 600 MG PO TB12
1200.0000 mg | ORAL_TABLET | Freq: Two times a day (BID) | ORAL | Status: DC
  Administered 2011-05-29 – 2011-05-31 (×5): 1200 mg via ORAL
  Filled 2011-05-29 (×6): qty 2

## 2011-05-29 MED ORDER — NITROGLYCERIN 0.4 MG SL SUBL: 0.4000 mg | SUBLINGUAL_TABLET | SUBLINGUAL | Status: DC | PRN

## 2011-05-29 MED ORDER — INSULIN ASPART 100 UNIT/ML ~~LOC~~ SOLN
0.0000 [IU] | Freq: Three times a day (TID) | SUBCUTANEOUS | Status: DC
  Administered 2011-05-29 – 2011-05-31 (×4): 2 [IU] via SUBCUTANEOUS
  Administered 2011-06-01: 1 [IU] via SUBCUTANEOUS
  Filled 2011-05-29: qty 3

## 2011-05-29 MED ORDER — INSULIN ASPART 100 UNIT/ML ~~LOC~~ SOLN
0.0000 [IU] | Freq: Every day | SUBCUTANEOUS | Status: DC
  Filled 2011-05-29: qty 3

## 2011-05-29 MED ORDER — LEVOFLOXACIN IN D5W 750 MG/150ML IV SOLN
750.0000 mg | Freq: Every day | INTRAVENOUS | Status: DC
  Administered 2011-05-29 – 2011-05-30 (×3): 750 mg via INTRAVENOUS
  Filled 2011-05-29 (×5): qty 150

## 2011-05-29 MED ORDER — HYDROCODONE-ACETAMINOPHEN 5-325 MG PO TABS
1.0000 | ORAL_TABLET | ORAL | Status: DC | PRN
  Administered 2011-05-29 – 2011-05-31 (×7): 1 via ORAL
  Filled 2011-05-29 (×7): qty 1

## 2011-05-29 MED ORDER — INSULIN GLARGINE 100 UNIT/ML ~~LOC~~ SOLN
8.0000 [IU] | Freq: Every day | SUBCUTANEOUS | Status: DC
  Administered 2011-05-29 – 2011-05-31 (×4): 8 [IU] via SUBCUTANEOUS
  Filled 2011-05-29: qty 3

## 2011-05-29 MED ORDER — TICLOPIDINE HCL 250 MG PO TABS
250.0000 mg | ORAL_TABLET | Freq: Two times a day (BID) | ORAL | Status: DC
  Administered 2011-05-29 – 2011-06-01 (×7): 250 mg via ORAL
  Filled 2011-05-29 (×9): qty 1

## 2011-05-29 MED ORDER — SENNA 8.6 MG PO TABS: 1.0000 | ORAL_TABLET | Freq: Every day | ORAL | Status: DC | PRN

## 2011-05-29 MED ORDER — LEVOTHYROXINE SODIUM 125 MCG PO TABS
125.0000 ug | ORAL_TABLET | Freq: Every day | ORAL | Status: DC
  Administered 2011-05-29 – 2011-06-01 (×4): 125 ug via ORAL
  Filled 2011-05-29 (×4): qty 1

## 2011-05-29 MED ORDER — ASPIRIN 325 MG PO TABS
325.0000 mg | ORAL_TABLET | Freq: Two times a day (BID) | ORAL | Status: DC
  Administered 2011-05-29 – 2011-06-01 (×7): 325 mg via ORAL
  Filled 2011-05-29 (×8): qty 1

## 2011-05-29 MED ORDER — ACETAMINOPHEN 325 MG PO TABS
650.0000 mg | ORAL_TABLET | Freq: Four times a day (QID) | ORAL | Status: DC | PRN
  Administered 2011-05-29 – 2011-05-30 (×2): 650 mg via ORAL
  Filled 2011-05-29 (×3): qty 2

## 2011-05-29 MED ORDER — TRIAZOLAM 0.25 MG PO TABS: 0.2500 mg | ORAL_TABLET | Freq: Every evening | ORAL | Status: DC | PRN

## 2011-05-29 NOTE — Progress Notes (Signed)
Utilization Review Completed.[name] T12/08/2010   

## 2011-05-29 NOTE — Progress Notes (Signed)
Subjective: Mild head ache , breathing is better, requesting Vicodin.  Objective: Weight change:   Intake/Output Summary (Last 24 hours) at 05/29/11 1454 Last data filed at 05/29/11 1100  Gross per 24 hour  Intake    552 ml  Output    350 ml  Net    202 ml   Pt is alert afebrile and comfortable cvs s1 s2 heard Lungs clear Abdomen soft non tender bowel sounds heard Extremities: no pedal edema, cyanosis and clubbing.  Lab Results: Results for orders placed during the hospital encounter of 05/28/11 (from the past 24 hour(s))  C-REACTIVE PROTEIN     Status: Abnormal   Collection Time   05/29/11  1:00 AM      Component Value Range   CRP 10.47 (*) <0.60 (mg/dL)  CARDIAC PANEL(CRET KIN+CKTOT+MB+TROPI)     Status: Abnormal   Collection Time   05/29/11  2:50 AM      Component Value Range   Total CK 156  7 - 177 (U/L)   CK, MB 4.3 (*) 0.3 - 4.0 (ng/mL)   Troponin I <0.30  <0.30 (ng/mL)   Relative Index 2.8 (*) 0.0 - 2.5   SEDIMENTATION RATE     Status: Abnormal   Collection Time   05/29/11  6:00 AM      Component Value Range   Sed Rate 122 (*) 0 - 22 (mm/hr)  RAPID STREP SCREEN     Status: Normal   Collection Time   05/29/11  7:00 AM      Component Value Range   Streptococcus, Group A Screen (Direct) NEGATIVE  NEGATIVE   GLUCOSE, CAPILLARY     Status: Abnormal   Collection Time   05/29/11  8:20 AM      Component Value Range   Glucose-Capillary 118 (*) 70 - 99 (mg/dL)  CARDIAC PANEL(CRET KIN+CKTOT+MB+TROPI)     Status: Abnormal   Collection Time   05/29/11  8:30 AM      Component Value Range   Total CK 151  7 - 177 (U/L)   CK, MB 4.5 (*) 0.3 - 4.0 (ng/mL)   Troponin I <0.30  <0.30 (ng/mL)   Relative Index 3.0 (*) 0.0 - 2.5   GLUCOSE, CAPILLARY     Status: Abnormal   Collection Time   05/29/11 12:13 PM      Component Value Range   Glucose-Capillary 182 (*) 70 - 99 (mg/dL)  BASIC METABOLIC PANEL     Status: Abnormal   Collection Time   05/29/11 12:58 PM      Component  Value Range   Sodium 130 (*) 135 - 145 (mEq/L)   Potassium 4.0  3.5 - 5.1 (mEq/L)   Chloride 95 (*) 96 - 112 (mEq/L)   CO2 27  19 - 32 (mEq/L)   Glucose, Bld 186 (*) 70 - 99 (mg/dL)   BUN 17  6 - 23 (mg/dL)   Creatinine, Ser 1.61  0.50 - 1.10 (mg/dL)   Calcium 7.5 (*) 8.4 - 10.5 (mg/dL)   GFR calc non Af Amer 64 (*) >90 (mL/min)   GFR calc Af Amer 74 (*) >90 (mL/min)  MAGNESIUM     Status: Normal   Collection Time   05/29/11 12:58 PM      Component Value Range   Magnesium 1.8  1.5 - 2.5 (mg/dL)  CARDIAC PANEL(CRET KIN+CKTOT+MB+TROPI)     Status: Abnormal   Collection Time   05/29/11  3:39 PM      Component Value Range  Total CK 133  7 - 177 (U/L)   CK, MB 3.8  0.3 - 4.0 (ng/mL)   Troponin I <0.30  <0.30 (ng/mL)   Relative Index 2.9 (*) 0.0 - 2.5   GLUCOSE, CAPILLARY     Status: Abnormal   Collection Time   05/29/11  5:01 PM      Component Value Range   Glucose-Capillary 152 (*) 70 - 99 (mg/dL)     Micro Results: Recent Results (from the past 240 hour(s))  URINE CULTURE     Status: Normal   Collection Time   05/28/11 11:52 AM      Component Value Range Status Comment   Specimen Description URINE, RANDOM   Final    Special Requests NONE   Final    Setup Time [id number]   Final    Colony Count 35,000 COLONIES/ML   Final    Culture     Final    Value: Multiple bacterial morphotypes present, none predominant. Suggest appropriate recollection if clinically indicated.   Report Status 05/29/2011 FINAL   Final   RAPID STREP SCREEN     Status: Normal   Collection Time   05/29/11  7:00 AM      Component Value Range Status Comment   Streptococcus, Group A Screen (Direct) NEGATIVE  NEGATIVE  Final     Studies/Results: Dg Chest 2 View  05/28/2011  *RADIOLOGY REPORT*  Clinical Data: Fever and shortness of breath.  CHEST - 2 VIEW  Comparison: 03/13/2011  Findings: Two views of the chest demonstrate new airspace densities in the right mid and lower lung regions. The disease may be  involving more than one pulmonary lobe.  Left lung is clear.  Heart mediastinum are within normal limits.  Trachea is midline.  IMPRESSION: New airspace densities in the right lung.  Findings are most compatible with multifocal pneumonia.  Recommend follow-up chest radiographs to ensure resolution.  Original Report Authenticated By: Richarda Overlie, M.D.   Medications: Scheduled Meds:   . aspirin  325 mg Oral BID  . guaiFENesin  1,200 mg Oral BID  . HYDROcodone-acetaminophen  1 tablet Oral Once  . HYDROcodone-acetaminophen  1 tablet Oral Once  . HYDROcodone-acetaminophen  1 tablet Oral Once  . imipramine  75 mg Oral QHS  . insulin aspart  0-5 Units Subcutaneous QHS  . insulin aspart  0-9 Units Subcutaneous TID WC  . insulin glargine  8 Units Subcutaneous QHS  . levofloxacin (LEVAQUIN) IV  750 mg Intravenous QHS  . levothyroxine  125 mcg Oral Daily  . magnesium sulfate IVPB  2 g Intravenous STAT  . pantoprazole  40 mg Oral Daily  . potassium chloride  10 mEq Intravenous Once  . predniSONE  5 mg Oral Daily  . sodium chloride  1,000 mL Intravenous Once  . ticlopidine  250 mg Oral BID  . DISCONTD: moxifloxacin  400 mg Intravenous Q24H   Continuous Infusions:  PRN Meds:.acetaminophen, HYDROcodone-acetaminophen, nitroGLYCERIN, senna, zolpidem, DISCONTD: triazolam  Assessment/Plan: CAP: on levaquin.  Hypothyroidism: continue with levothyroxine Hypokalemia : repleted Hypomagnesemia: repleted GERD on protonix    LOS: 1 day   Jeanmarc Viernes 05/29/2011, 2:54 PM

## 2011-05-30 ENCOUNTER — Inpatient Hospital Stay (HOSPITAL_COMMUNITY): Payer: Medicare Other

## 2011-05-30 DIAGNOSIS — E039 Hypothyroidism, unspecified: Secondary | ICD-10-CM | POA: Diagnosis present

## 2011-05-30 DIAGNOSIS — E119 Type 2 diabetes mellitus without complications: Secondary | ICD-10-CM | POA: Diagnosis present

## 2011-05-30 DIAGNOSIS — J449 Chronic obstructive pulmonary disease, unspecified: Secondary | ICD-10-CM | POA: Diagnosis present

## 2011-05-30 DIAGNOSIS — M349 Systemic sclerosis, unspecified: Secondary | ICD-10-CM | POA: Diagnosis present

## 2011-05-30 DIAGNOSIS — J189 Pneumonia, unspecified organism: Secondary | ICD-10-CM | POA: Diagnosis present

## 2011-05-30 LAB — LEGIONELLA ANTIGEN, URINE: Legionella Antigen, Urine: NEGATIVE

## 2011-05-30 LAB — GLUCOSE, CAPILLARY
Glucose-Capillary: 107 mg/dL — ABNORMAL HIGH (ref 70–99)
Glucose-Capillary: 181 mg/dL — ABNORMAL HIGH (ref 70–99)
Glucose-Capillary: 167 mg/dL — ABNORMAL HIGH (ref 70–99)
Glucose-Capillary: 165 mg/dL — ABNORMAL HIGH (ref 70–99)

## 2011-05-30 LAB — CBC
HCT: 30.2 % — ABNORMAL LOW (ref 36.0–46.0)
Hemoglobin: 9.7 g/dL — ABNORMAL LOW (ref 12.0–15.0)
MCH: 26.3 pg (ref 26.0–34.0)
MCHC: 32.1 g/dL (ref 30.0–36.0)
MCV: 81.8 fL (ref 78.0–100.0)
Platelets: 199 10*3/uL (ref 150–400)
RBC: 3.69 MIL/uL — ABNORMAL LOW (ref 3.87–5.11)
RDW: 13.7 % (ref 11.5–15.5)
WBC: 11.1 10*3/uL — ABNORMAL HIGH (ref 4.0–10.5)

## 2011-05-30 LAB — BASIC METABOLIC PANEL
BUN: 11 mg/dL (ref 6–23)
CO2: 26 mEq/L (ref 19–32)
Calcium: 8.5 mg/dL (ref 8.4–10.5)
Chloride: 102 mEq/L (ref 96–112)
Creatinine, Ser: 0.8 mg/dL (ref 0.50–1.10)
GFR calc Af Amer: 80 mL/min — ABNORMAL LOW (ref >90)
GFR calc non Af Amer: 69 mL/min — ABNORMAL LOW (ref >90)
Glucose, Bld: 87 mg/dL (ref 70–99)
Potassium: 4.1 mEq/L (ref 3.5–5.1)
Sodium: 137 mEq/L (ref 135–145)

## 2011-05-30 NOTE — Progress Notes (Signed)
Subjective: She looks better today. Patient is partially blind.  Objective: Weight change:   Intake/Output Summary (Last 24 hours) at 05/30/11 1852 Last data filed at 05/30/11 1345  Gross per 24 hour  Intake    600 ml  Output      0 ml  Net    600 ml   Patient is alert afebrile comfortable no acute distress  cardiovascular S1-S2 heard Respiratory exam chest clear to auscultation bilaterally no wheezing or rhonchi  Abdomen is soft nontender nondistended bowel sounds are heard  Extremities no pedal edema cyanosis or clubbing  Neurological exam no new focal deficits. Lab Results: Results for orders placed during the hospital encounter of 05/28/11 (from the past 24 hour(s))  GLUCOSE, CAPILLARY     Status: Abnormal   Collection Time   05/29/11 11:10 PM      Component Value Range   Glucose-Capillary 167 (*) 70 - 99 (mg/dL)   Comment 1 Notify RN     Comment 2 Documented in Chart    CBC     Status: Abnormal   Collection Time   05/30/11  6:10 AM      Component Value Range   WBC 11.1 (*) 4.0 - 10.5 (K/uL)   RBC 3.69 (*) 3.87 - 5.11 (MIL/uL)   Hemoglobin 9.7 (*) 12.0 - 15.0 (g/dL)   HCT 16.1 (*) 09.6 - 46.0 (%)   MCV 81.8  78.0 - 100.0 (fL)   MCH 26.3  26.0 - 34.0 (pg)   MCHC 32.1  30.0 - 36.0 (g/dL)   RDW 04.5  40.9 - 81.1 (%)   Platelets 199  150 - 400 (K/uL)  BASIC METABOLIC PANEL     Status: Abnormal   Collection Time   05/30/11  6:10 AM      Component Value Range   Sodium 137  135 - 145 (mEq/L)   Potassium 4.1  3.5 - 5.1 (mEq/L)   Chloride 102  96 - 112 (mEq/L)   CO2 26  19 - 32 (mEq/L)   Glucose, Bld 87  70 - 99 (mg/dL)   BUN 11  6 - 23 (mg/dL)   Creatinine, Ser 9.14  0.50 - 1.10 (mg/dL)   Calcium 8.5  8.4 - 78.2 (mg/dL)   GFR calc non Af Amer 69 (*) >90 (mL/min)   GFR calc Af Amer 80 (*) >90 (mL/min)  GLUCOSE, CAPILLARY     Status: Abnormal   Collection Time   05/30/11  7:52 AM      Component Value Range   Glucose-Capillary 107 (*) 70 - 99 (mg/dL)  GLUCOSE,  CAPILLARY     Status: Abnormal   Collection Time   05/30/11 12:02 PM      Component Value Range   Glucose-Capillary 181 (*) 70 - 99 (mg/dL)  GLUCOSE, CAPILLARY     Status: Abnormal   Collection Time   05/30/11  4:53 PM      Component Value Range   Glucose-Capillary 167 (*) 70 - 99 (mg/dL)     Micro Results: Recent Results (from the past 240 hour(s))  URINE CULTURE     Status: Normal   Collection Time   05/28/11 11:52 AM      Component Value Range Status Comment   Specimen Description URINE, RANDOM   Final    Special Requests NONE   Final    Setup Time [id number]   Final    Colony Count 35,000 COLONIES/ML   Final    Culture  Final    Value: Multiple bacterial morphotypes present, none predominant. Suggest appropriate recollection if clinically indicated.   Report Status 05/29/2011 FINAL   Final   CULTURE, BLOOD (ROUTINE X 2)     Status: Normal (Preliminary result)   Collection Time   05/29/11  2:50 AM      Component Value Range Status Comment   Specimen Description BLOOD LEFT ARM   Final    Special Requests     Final    Value: BOTTLES DRAWN AEROBIC AND ANAEROBIC 10CC BLUE 5CC RED   Setup Time [id number]   Final    Culture     Final    Value:        BLOOD CULTURE RECEIVED NO GROWTH TO DATE CULTURE WILL BE HELD FOR 5 DAYS BEFORE ISSUING A FINAL NEGATIVE REPORT   Report Status PENDING   Incomplete   CULTURE, BLOOD (ROUTINE X 2)     Status: Normal (Preliminary result)   Collection Time   05/29/11  5:45 AM      Component Value Range Status Comment   Specimen Description BLOOD LEFT HAND   Final    Special Requests BOTTLES DRAWN AEROBIC AND ANAEROBIC 10CC EA   Final    Setup Time [id number]   Final    Culture     Final    Value:        BLOOD CULTURE RECEIVED NO GROWTH TO DATE CULTURE WILL BE HELD FOR 5 DAYS BEFORE ISSUING A FINAL NEGATIVE REPORT   Report Status PENDING   Incomplete   RAPID STREP SCREEN     Status: Normal   Collection Time   05/29/11  7:00 AM       Component Value Range Status Comment   Streptococcus, Group A Screen (Direct) NEGATIVE  NEGATIVE  Final     Studies/Results: Dg Chest 2 View  05/30/2011  *RADIOLOGY REPORT*  Clinical Data: Resolution of pneumonia  CHEST - 2 VIEW  Comparison: Chest radiograph 05/28/2011 and chest CT 02/02/2009  Findings: Heart size upper normal in stable and coronary artery atherosclerotic calcification is visible.  Lungs are mildly hyperinflated.  Airspace opacities in the right midlung and right lung base are present and slightly improved compared to 05/28/2011. Fullness of the right hilar region may be due to airspace disease or lymphadenopathy.  The left lung appears clear. Surgical clips related to left thyroidectomy.  Typical degenerative changes of the thoracic spine.  IMPRESSION:  1.  Airspace disease in the right midlung and right lung base persists, but appears slightly decreased from 05/28/2011.  Please note that radiographic changes can lag behind clinical improvement. 2.  Right hilar fullness may be due to airspace disease and/or lymphadenopathy. Suggest radiographic follow-up to clearing.  Original Report Authenticated By: Britta Mccreedy, M.D.   Dg Chest 2 View  05/28/2011  *RADIOLOGY REPORT*  Clinical Data: Fever and shortness of breath.  CHEST - 2 VIEW  Comparison: 03/13/2011  Findings: Two views of the chest demonstrate new airspace densities in the right mid and lower lung regions. The disease may be involving more than one pulmonary lobe.  Left lung is clear.  Heart mediastinum are within normal limits.  Trachea is midline.  IMPRESSION: New airspace densities in the right lung.  Findings are most compatible with multifocal pneumonia.  Recommend follow-up chest radiographs to ensure resolution.  Original Report Authenticated By: Richarda Overlie, M.D.   Medications: Scheduled Meds:   . aspirin  325 mg Oral BID  . guaiFENesin  1,200 mg Oral BID  . imipramine  75 mg Oral QHS  . insulin aspart  0-5 Units  Subcutaneous QHS  . insulin aspart  0-9 Units Subcutaneous TID WC  . insulin glargine  8 Units Subcutaneous QHS  . levofloxacin (LEVAQUIN) IV  750 mg Intravenous QHS  . levothyroxine  125 mcg Oral Daily  . pantoprazole  40 mg Oral Daily  . predniSONE  5 mg Oral Daily  . ticlopidine  250 mg Oral BID   Continuous Infusions:  PRN Meds:.acetaminophen, HYDROcodone-acetaminophen, nitroGLYCERIN, senna, zolpidem  Assessment/Plan: #1 community-acquired pneumonia patient is clinically improving she is currently afebrile leukocytosis is getting better. She is continued on IV Levaquin. Repeat chest x-ray shows an improvement in pneumonia. She can probably be discharged home tomorrow on oral Levaquin..  Insulin-dependent diabetes:  CBGs less than 200 on 8 units of Lantus and sliding scale insulin. Hypothyroidism continue with levothyroxine 125 MCG daily  Scleroderma on chronic steroids at 5 mg daily  Depression  Patient is partially blind  DVT prophylaxis SCDs  Disposition possible discharge in a.m.  LOS: 2 days   Alyjah Lovingood 05/30/2011, 6:52 PM

## 2011-05-31 LAB — GLUCOSE, CAPILLARY
Glucose-Capillary: 68 mg/dL — ABNORMAL LOW (ref 70–99)
Glucose-Capillary: 77 mg/dL (ref 70–99)
Glucose-Capillary: 146 mg/dL — ABNORMAL HIGH (ref 70–99)
Glucose-Capillary: 171 mg/dL — ABNORMAL HIGH (ref 70–99)
Glucose-Capillary: 114 mg/dL — ABNORMAL HIGH (ref 70–99)

## 2011-05-31 MED ORDER — NYSTATIN 100000 UNIT/ML MT SUSP
5.0000 mL | Freq: Four times a day (QID) | OROMUCOSAL | Status: DC
  Administered 2011-05-31 – 2011-06-01 (×5): 500000 [IU] via ORAL
  Filled 2011-05-31 (×8): qty 5

## 2011-05-31 MED ORDER — PSEUDOEPHEDRINE HCL 30 MG PO TABS
30.0000 mg | ORAL_TABLET | Freq: Four times a day (QID) | ORAL | Status: DC | PRN
  Filled 2011-05-31: qty 1

## 2011-05-31 MED ORDER — BENZONATATE 100 MG PO CAPS
100.0000 mg | ORAL_CAPSULE | Freq: Three times a day (TID) | ORAL | Status: DC
  Administered 2011-05-31 – 2011-06-01 (×4): 100 mg via ORAL
  Filled 2011-05-31 (×6): qty 1

## 2011-05-31 MED ORDER — LORATADINE 10 MG PO TABS
10.0000 mg | ORAL_TABLET | Freq: Every day | ORAL | Status: DC
  Administered 2011-05-31: 10 mg via ORAL
  Filled 2011-05-31 (×2): qty 1

## 2011-05-31 MED ORDER — AMOXICILLIN-POT CLAVULANATE 875-125 MG PO TABS
1.0000 | ORAL_TABLET | Freq: Two times a day (BID) | ORAL | Status: DC
  Administered 2011-05-31 – 2011-06-01 (×3): 1 via ORAL
  Filled 2011-05-31 (×4): qty 1

## 2011-05-31 MED ORDER — HYDROCOD POLST-CHLORPHEN POLST 10-8 MG/5ML PO LQCR
5.0000 mL | Freq: Two times a day (BID) | ORAL | Status: DC
  Administered 2011-05-31 – 2011-06-01 (×3): 5 mL via ORAL
  Filled 2011-05-31 (×3): qty 5

## 2011-05-31 NOTE — Progress Notes (Addendum)
Subjective: Patient is partially blind, states she's not feeling well today, coughing with left ear fullness, oral thrush.  Objective: Weight change:   Intake/Output Summary (Last 24 hours) at 05/31/11 1358 Last data filed at 05/31/11 0919  Gross per 24 hour  Intake    390 ml  Output      0 ml  Net    390 ml   Blood pressure 147/67, pulse 75, temperature 98.1 F (36.7 C), temperature source Oral, resp. rate 14, height 5\' 2"  (1.575 m), weight 63.3 kg (139 lb 8.8 oz), SpO2 96.00%. Patient is alert afebrile comfortable no acute distress  cardiovascular S1-S2 heard, no murmur rubs or gallops Respiratory exam chest clear to auscultation bilaterally no wheezing or rhonchi Abdomen: soft nontender nondistended, bowel sounds are heard Extremities: No sinus clubbing or edema Neuro: Alert and oriented x3  Lab Results: Results for orders placed during the hospital encounter of 05/28/11 (from the past 24 hour(s))  GLUCOSE, CAPILLARY     Status: Abnormal   Collection Time   05/30/11  4:53 PM      Component Value Range   Glucose-Capillary 167 (*) 70 - 99 (mg/dL)  GLUCOSE, CAPILLARY     Status: Abnormal   Collection Time   05/30/11 10:07 PM      Component Value Range   Glucose-Capillary 165 (*) 70 - 99 (mg/dL)  GLUCOSE, CAPILLARY     Status: Abnormal   Collection Time   05/31/11  7:47 AM      Component Value Range   Glucose-Capillary 68 (*) 70 - 99 (mg/dL)  GLUCOSE, CAPILLARY     Status: Normal   Collection Time   05/31/11  8:53 AM      Component Value Range   Glucose-Capillary 77  70 - 99 (mg/dL)  GLUCOSE, CAPILLARY     Status: Abnormal   Collection Time   05/31/11 12:08 PM      Component Value Range   Glucose-Capillary 146 (*) 70 - 99 (mg/dL)     Micro Results: Recent Results (from the past 240 hour(s))  URINE CULTURE     Status: Normal   Collection Time   05/28/11 11:52 AM      Component Value Range Status Comment   Specimen Description URINE, RANDOM   Final    Special  Requests NONE   Final    Setup Time [id number]   Final    Colony Count 35,000 COLONIES/ML   Final    Culture     Final    Value: Multiple bacterial morphotypes present, none predominant. Suggest appropriate recollection if clinically indicated.   Report Status 05/29/2011 FINAL   Final   CULTURE, BLOOD (ROUTINE X 2)     Status: Normal (Preliminary result)   Collection Time   05/29/11  2:50 AM      Component Value Range Status Comment   Specimen Description BLOOD LEFT ARM   Final    Special Requests     Final    Value: BOTTLES DRAWN AEROBIC AND ANAEROBIC 10CC BLUE 5CC RED   Setup Time [id number]   Final    Culture     Final    Value:        BLOOD CULTURE RECEIVED NO GROWTH TO DATE CULTURE WILL BE HELD FOR 5 DAYS BEFORE ISSUING A FINAL NEGATIVE REPORT   Report Status PENDING   Incomplete   CULTURE, BLOOD (ROUTINE X 2)     Status: Normal (Preliminary result)   Collection Time   05/29/11  5:45 AM      Component Value Range Status Comment   Specimen Description BLOOD LEFT HAND   Final    Special Requests BOTTLES DRAWN AEROBIC AND ANAEROBIC 10CC EA   Final    Setup Time [id number]   Final    Culture     Final    Value:        BLOOD CULTURE RECEIVED NO GROWTH TO DATE CULTURE WILL BE HELD FOR 5 DAYS BEFORE ISSUING A FINAL NEGATIVE REPORT   Report Status PENDING   Incomplete   RAPID STREP SCREEN     Status: Normal   Collection Time   05/29/11  7:00 AM      Component Value Range Status Comment   Streptococcus, Group A Screen (Direct) NEGATIVE  NEGATIVE  Final     Studies/Results: Dg Chest 2 View  05/30/2011  *RADIOLOGY REPORT*  Clinical Data: Resolution of pneumonia  CHEST - 2 VIEW  Comparison: Chest radiograph 05/28/2011 and chest CT 02/02/2009  Findings: Heart size upper normal in stable and coronary artery atherosclerotic calcification is visible.  Lungs are mildly hyperinflated.  Airspace opacities in the right midlung and right lung base are present and slightly improved compared to  05/28/2011. Fullness of the right hilar region may be due to airspace disease or lymphadenopathy.  The left lung appears clear. Surgical clips related to left thyroidectomy.  Typical degenerative changes of the thoracic spine.  IMPRESSION:  1.  Airspace disease in the right midlung and right lung base persists, but appears slightly decreased from 05/28/2011.  Please note that radiographic changes can lag behind clinical improvement. 2.  Right hilar fullness may be due to airspace disease and/or lymphadenopathy. Suggest radiographic follow-up to clearing.  Original Report Authenticated By: Britta Mccreedy, M.D.   Dg Chest 2 View  05/28/2011  *RADIOLOGY REPORT*  Clinical Data: Fever and shortness of breath.  CHEST - 2 VIEW  Comparison: 03/13/2011  Findings: Two views of the chest demonstrate new airspace densities in the right mid and lower lung regions. The disease may be involving more than one pulmonary lobe.  Left lung is clear.  Heart mediastinum are within normal limits.  Trachea is midline.  IMPRESSION: New airspace densities in the right lung.  Findings are most compatible with multifocal pneumonia.  Recommend follow-up chest radiographs to ensure resolution.  Original Report Authenticated By: Richarda Overlie, M.D.   Medications: Scheduled Meds:    . amoxicillin-clavulanate  1 tablet Oral Q12H  . aspirin  325 mg Oral BID  . benzonatate  100 mg Oral TID  . chlorpheniramine-HYDROcodone  5 mL Oral Q12H  . imipramine  75 mg Oral QHS  . insulin aspart  0-5 Units Subcutaneous QHS  . insulin aspart  0-9 Units Subcutaneous TID WC  . insulin glargine  8 Units Subcutaneous QHS  . levothyroxine  125 mcg Oral Daily  . loratadine  10 mg Oral Daily  . nystatin  5 mL Oral QID  . pantoprazole  40 mg Oral Daily  . predniSONE  5 mg Oral Daily  . ticlopidine  250 mg Oral BID  . DISCONTD: guaiFENesin  1,200 mg Oral BID  . DISCONTD: levofloxacin (LEVAQUIN) IV  750 mg Intravenous QHS   Continuous Infusions:  PRN  Meds:.acetaminophen, HYDROcodone-acetaminophen, nitroGLYCERIN, pseudoephedrine, senna, zolpidem  Assessment/Plan: #1 community-acquired pneumonia patient with sinusitis: still coughing with nasal stuffiness and congestion, complaining of the left ear fullness, on IV Levaquin with access issues. Repeat chest x-ray shows an improvement in pneumonia.  -  DC Levaquin, placed on Augmentin to cover the sinusitis and pneumonia.  - Place on nystatin suspension for oral thrush, Tussionex, Claritin-D  Insulin-dependent diabetes:  CBGs less than 200 on 8 units of Lantus and sliding scale insulin.  Hypothyroidism continue with levothyroxine 125 MCG daily  Scleroderma on chronic steroids at 5 mg daily  Depression: Stable  Patient is partially blind  DVT prophylaxis SCDs  Disposition possible discharge in a.m.   LOS: 3 days   RAI,RIPUDEEP 05/31/2011, 1:58 PM

## 2011-05-31 NOTE — Progress Notes (Signed)
Pt IV site was occluded when attempted to flush to give IV Levaquin.  Pt stated that she has been stuck 8 times for an IV already.  x2 RNs assessed and one unsuccessful attempt was made.  IV team was called and started a new IV, and Levaquin was hung.  IV team RN encouraged a PICC line if pt will be here for than another day or two.  Sticky note was added for MD to assess that need in the AM.  Will continue to monitor.  Angelique Blonder, RN

## 2011-06-01 LAB — BASIC METABOLIC PANEL
BUN: 12 mg/dL (ref 6–23)
CO2: 25 mEq/L (ref 19–32)
Calcium: 8.5 mg/dL (ref 8.4–10.5)
Chloride: 101 mEq/L (ref 96–112)
Creatinine, Ser: 0.78 mg/dL (ref 0.50–1.10)
GFR calc Af Amer: >90 mL/min (ref >90)
GFR calc non Af Amer: 78 mL/min — ABNORMAL LOW (ref >90)
Glucose, Bld: 78 mg/dL (ref 70–99)
Potassium: 3.4 mEq/L — ABNORMAL LOW (ref 3.5–5.1)
Sodium: 137 mEq/L (ref 135–145)

## 2011-06-01 LAB — CBC
HCT: 29.5 % — ABNORMAL LOW (ref 36.0–46.0)
Hemoglobin: 9.4 g/dL — ABNORMAL LOW (ref 12.0–15.0)
MCH: 26 pg (ref 26.0–34.0)
MCHC: 31.9 g/dL (ref 30.0–36.0)
MCV: 81.7 fL (ref 78.0–100.0)
Platelets: 216 10*3/uL (ref 150–400)
RBC: 3.61 MIL/uL — ABNORMAL LOW (ref 3.87–5.11)
RDW: 13.7 % (ref 11.5–15.5)
WBC: 6.6 10*3/uL (ref 4.0–10.5)

## 2011-06-01 LAB — GLUCOSE, CAPILLARY
Glucose-Capillary: 84 mg/dL (ref 70–99)
Glucose-Capillary: 149 mg/dL — ABNORMAL HIGH (ref 70–99)

## 2011-06-01 MED ORDER — AMOXICILLIN-POT CLAVULANATE 875-125 MG PO TABS: 1.0000 | ORAL_TABLET | Freq: Two times a day (BID) | ORAL | Status: AC

## 2011-06-01 MED ORDER — LORATADINE 10 MG PO TABS: 10.0000 mg | ORAL_TABLET | Freq: Every day | ORAL | Status: DC

## 2011-06-01 MED ORDER — BENZONATATE 100 MG PO CAPS: 100.0000 mg | ORAL_CAPSULE | Freq: Three times a day (TID) | ORAL | Status: AC

## 2011-06-01 MED ORDER — NYSTATIN 100000 UNIT/ML MT SUSP: 5.0000 mL | Freq: Four times a day (QID) | OROMUCOSAL | Status: AC

## 2011-06-01 MED ORDER — FLUTICASONE PROPIONATE 50 MCG/ACT NA SUSP
1.0000 | Freq: Every day | NASAL | Status: DC
  Filled 2011-06-01: qty 16

## 2011-06-01 MED ORDER — HYDROCOD POLST-CHLORPHEN POLST 10-8 MG/5ML PO LQCR: 5.0000 mL | Freq: Two times a day (BID) | ORAL | Status: DC

## 2011-06-01 MED ORDER — HYDROCOD POLST-CHLORPHEN POLST 10-8 MG/5ML PO LQCR: 5.0000 mL | Freq: Two times a day (BID) | ORAL | Status: DC | PRN

## 2011-06-01 NOTE — Discharge Summary (Signed)
Physician Discharge Summary  Patient ID: [name] MRN: [id number] DOB/AGE: 75/13/1932 75 y.o.  Admit date: 05/28/2011 Discharge date: 06/01/2011  Primary Care Physician:  Rama (Georgianne Fick) Sofie Hartigan, MD, MD Primary pulmonologist: Dr. Coralyn Helling  Final Discharge Diagnoses:   .Community acquired pneumonia Sinusitis  .Diabetes mellitus .Hypothyroidism .COPD (chronic obstructive pulmonary disease) .Scleroderma     Discharge Medications: Current Discharge Medication List    START taking these medications   Details  amoxicillin-clavulanate (AUGMENTIN) 875-125 MG per tablet Take 1 tablet by mouth every 12 (twelve) hours. Qty: 20 tablet, Refills: 0    benzonatate (TESSALON) 100 MG capsule Take 1 capsule (100 mg total) by mouth 3 (three) times daily. Qty: 60 capsule, Refills: 1    chlorpheniramine-HYDROcodone (TUSSIONEX) 10-8 MG/5ML LQCR Take 5 mLs by mouth every 12 (twelve) hours as needed (for cough). Qty: 140 mL, Refills: 0    loratadine (CLARITIN) 10 MG tablet Take 1 tablet (10 mg total) by mouth daily. Qty: 60 tablet, Refills: 1    nystatin (MYCOSTATIN) 100000 UNIT/ML suspension Take 5 mLs (500,000 Units total) by mouth 4 (four) times daily. Qty: 120 mL, Refills: 0      CONTINUE these medications which have NOT CHANGED   Details  aspirin 325 MG tablet Take 325 mg by mouth 2 (two) times daily.     esomeprazole (NEXIUM) 40 MG capsule Take 1 capsule (40 mg total) by mouth daily before breakfast. Qty: 30 capsule, Refills: 6    imipramine (TOFRANIL) 25 MG tablet Take 75 mg by mouth at bedtime.     insulin glargine (LANTUS) 100 UNIT/ML injection Inject into the skin at bedtime. 8-12 untis     levothyroxine (SYNTHROID, LEVOTHROID) 125 MCG tablet Take 125 mcg by mouth daily.     predniSONE (DELTASONE) 5 MG tablet Take 5 mg by mouth daily.     ticlopidine (TICLID) 250 MG tablet Take 250 mg by mouth 2 (two) times daily.     mometasone (NASONEX) 50 MCG/ACT  nasal spray Place 2 sprays into the nose daily. As needed for congestion.     nitroGLYCERIN (NITROSTAT) 0.4 MG SL tablet Place 0.4 mg under the tongue every 5 (five) minutes as needed.      senna (SENOKOT) 8.6 MG tablet Take 1 tablet by mouth daily as needed. As needed for constipation.    triazolam (HALCION) 0.25 MG tablet Take 0.25 mg by mouth at bedtime as needed.        STOP taking these medications     guaiFENesin (MUCINEX) 600 MG 12 hr tablet          Brief H and P: For complete details please refer to admission H and P, but in brief patient is a 77 year old female with history of diabetes scleroderma on chronic steroid use, COPD presented to the emergency room with generalized weakness and lethargy. Patient was found to have a temperature of 103, associated with rigors and chills, productive phlegm, patient also has a history of sinusitis and recently completed treatment with Ceftin. Patient also complained of generalized weakness and had 5 day history of acute on chronic back pain. Patient went to the ED when she had a chest x-ray done which was just the acute pneumonia.  Hospital Course:  Principal Problem:  *Community acquired pneumonia and sinusitis: The patient was admitted to the medicine service. Patient was started on IV Levaquin, IV fluids. Chest x-ray showed new airspace densities in the right lung compatible with multifocal pneumonia. Repeat chest x-ray on 05/29/2004  showed airspace disease in the right mid lung and right lung base appeared to be slightly decreased. Patient also complained of sinusitis with left ear fullness and postnasal stuffiness and pain. At this point antibiotics were changed to Augmentin, patient has been tolerating Augmentin without any complaints and her symptoms off sinusitis were improved. Active Problems:  Diabetes mellitus: Patient was continued on Lantus and sliding scale insulin   Hypothyroidism: Patient was continued on Synthroid  COPD  (chronic obstructive pulmonary disease): Stable  Scleroderma: Continue prednisone Patient will be discharged home today, she was recommended to follow up with ENT, Dr. Jearld Fenton as an outpatient in the next 7-10 days   Day of Discharge BP 129/61  Pulse 81  Temp(Src) 98.2 F (36.8 C) (Oral)  Resp 18  Ht 5\' 2"  (1.575 m)  Wt 63.3 kg (139 lb 8.8 oz)  BMI 25.52 kg/m2  SpO2 95%  Physical Exam: General: Alert and awake oriented x3 not in any acute distress. HEENT: anicteric sclera, pupils reactive to light and accommodation CVS: S1-S2 clear no murmur rubs or gallops Chest: clear to auscultation bilaterally, no wheezing rales or rhonchi Abdomen: soft nontender, nondistended, normal bowel sounds, no organomegaly Extremities: no cyanosis, clubbing or edema noted bilaterally Neuro: Cranial nerves II-XII intact, no focal neurological deficits   The results of significant diagnostics from this hospitalization (including imaging, microbiology, ancillary and laboratory) are listed below for reference.    LAB RESULTS: Basic Metabolic Panel:  Lab 06/01/11 1610 05/30/11 0610 05/29/11 1258  NA 137 137 --  K 3.4* 4.1 --  CL 101 102 --  CO2 25 26 --  GLUCOSE 78 87 --  BUN 12 11 --  CREATININE 0.78 0.80 --  CALCIUM 8.5 8.5 --  MG -- -- 1.8  PHOS -- -- --   Liver Function Tests:  Lab 05/28/11 1131  AST 20  ALT 14  ALKPHOS 64  BILITOT 0.3  PROT 6.4  ALBUMIN 3.3*   CBC:  Lab 06/01/11 0640 05/30/11 0610 05/28/11 1131  WBC 6.6 11.1* --  NEUTROABS -- -- 13.5*  HGB 9.4* 9.7* --  HCT 29.5* 30.2* --  MCV 81.7 -- --  PLT 216 199 --   Cardiac Enzymes:  Lab 05/29/11 1539 05/29/11 0830  CKTOTAL 133 151  CKMB 3.8 4.5*  CKMBINDEX -- --  TROPONINI <0.30 <0.30   CBG:  Lab 06/01/11 1206 06/01/11 0819  GLUCAP 149* 84    Significant Diagnostic Studies:  Dg Chest 2 View  05/28/2011  *RADIOLOGY REPORT*  Clinical Data: Fever and shortness of breath.  CHEST - 2 VIEW  Comparison:  03/13/2011  Findings: Two views of the chest demonstrate new airspace densities in the right mid and lower lung regions. The disease may be involving more than one pulmonary lobe.  Left lung is clear.  Heart mediastinum are within normal limits.  Trachea is midline.  IMPRESSION: New airspace densities in the right lung.  Findings are most compatible with multifocal pneumonia.  Recommend follow-up chest radiographs to ensure resolution.  Original Report Authenticated By: Richarda Overlie, M.D.     Disposition and Follow-up: Discharge Orders    Future Orders Please Complete By Expires   Diet Carb Modified      Increase activity slowly      Discharge instructions      Comments:   - Please follow up with Dr Jearld Fenton (ENT) in next 10 days. - Soft diet until thrush resolved       DISPOSITION: Home DIET: carb modified  diet ACTIVITY: As tolerated  DISCHARGE FOLLOW-UP Follow-up Information    Follow up with Rama Georgianne Fick) Sofie Hartigan, MD. Make an appointment in 2 weeks.      Follow up with SOOD,VINEET, MD. Make an appointment in 10 days. (or earlier  if symptoms worsen)    Contact information:   [address] [employer] [place] [address] [phone]       Follow up with BYERS,JOHN M. Make an appointment in 10 days.   Contact information:   [facility] [address], [address] [place] [address] [phone]          Time spent on Discharge: 45 minutes  Signed: Lataunya Ruud 06/01/2011, 2:43 PM

## 2011-06-01 NOTE — Progress Notes (Signed)
Gave pt AVS with follow up appointments, new prescriptions and home medication list. All questions answered. D/C IV, unremarkable. Skin intact. Pt to be taken home by friend. Kayleana Waites CelanoRN

## 2011-06-04 LAB — CULTURE, BLOOD (ROUTINE X 2)
Culture  Setup Time: 201212030844
Culture  Setup Time: 201212031056
Culture: NO GROWTH
Culture: NO GROWTH

## 2011-06-05 ENCOUNTER — Telehealth: Payer: Self-pay | Admitting: Pulmonary Disease

## 2011-06-05 NOTE — Telephone Encounter (Signed)
LMTCB

## 2011-06-06 NOTE — Telephone Encounter (Signed)
She was in hospital last week for pneumonia and sinus infection.  She was on levaquin in hospital, and then sent home with augmentin for additional 10 days.  She still had temp 101F after hospital d/c, but has not been above 18F over past few days. She still has cough with sputum, but improved.  She still has sinus headache, but also improved.  She remains on same dose of prednisone.  Advised that she should complete her course of antibiotics, but nothing additional needed to be done at this time.  She should call back if her symptoms get worse after finishing antibioitics.  She has HFU scheduled for 07/11/11, and will need chest xray at that time.

## 2011-06-06 NOTE — Telephone Encounter (Signed)
Pt calling to make sure Dr. Craige Cotta is aware she was discharged from [facility] on Thurs., 12/6 after being diagnosed with pneumonia and she still has a low grade temp. She is currently on Augmentin for a sinus infection as well. Pls advise of any recs for this patient. She is scheduled for follow-up with VS on 07/11/11.

## 2011-06-15 ENCOUNTER — Inpatient Hospital Stay: Admit: 2011-06-15 | Payer: Self-pay | Admitting: Surgery

## 2011-06-15 SURGERY — LAPAROSCOPIC PARTIAL COLECTOMY
Anesthesia: General

## 2011-06-19 ENCOUNTER — Telehealth: Payer: Self-pay | Admitting: Pulmonary Disease

## 2011-06-19 MED ORDER — AMOXICILLIN-POT CLAVULANATE 875-125 MG PO TABS: 1.0000 | ORAL_TABLET | Freq: Two times a day (BID) | ORAL | Status: AC

## 2011-06-19 NOTE — Telephone Encounter (Signed)
Called and spoke with pt and she is aware of MW recs for 5 more days of augmentin and pt is aware that this has been sent to the pharmacy.  Pt stated that she has appt with Dr. Craige Cotta on 1-15 and will keep this appt and has been using the nasal saline and her netti pot.

## 2011-06-19 NOTE — Telephone Encounter (Signed)
Pt says she still has sinus congestion, mucus is green. Headaches and fever as high as 101. She is coughing from the PND. She did leave the hospital on Augmentin and finished this 3 days ago but feels she needs more to clear this up completely. She is aware VS is not in the office and we will forward this to doc of the day, Dr. Sherene Sires. Pls advise. Allergies  Allergen Reactions  . Lopressor Hct     REACTION: decreased bp  . Penicillins     REACTION: hives  . Sulfonamide Derivatives     REACTION: hives  . Titanium     Cervical plate - had to change for stainless steel

## 2011-06-19 NOTE — Telephone Encounter (Signed)
Ok for 5 more days augmentin then needs ov with Tammy/Sood or me  if not better and use plenty of nasal saline (otc) too to loosed up sinus congestion

## 2011-07-11 ENCOUNTER — Ambulatory Visit (INDEPENDENT_AMBULATORY_CARE_PROVIDER_SITE_OTHER): Payer: Medicare Other | Admitting: Pulmonary Disease

## 2011-07-11 ENCOUNTER — Ambulatory Visit (INDEPENDENT_AMBULATORY_CARE_PROVIDER_SITE_OTHER)
Admission: RE | Admit: 2011-07-11 | Discharge: 2011-07-11 | Disposition: A | Discharge status: Home / Self Care | Payer: Medicare Other | Source: Ambulatory Visit | Attending: Pulmonary Disease | Admitting: Pulmonary Disease

## 2011-07-11 ENCOUNTER — Encounter: Payer: Self-pay | Admitting: Pulmonary Disease

## 2011-07-11 DIAGNOSIS — J189 Pneumonia, unspecified organism: Secondary | ICD-10-CM

## 2011-07-11 DIAGNOSIS — J8409 Other alveolar and parieto-alveolar conditions: Secondary | ICD-10-CM

## 2011-07-11 DIAGNOSIS — Z8701 Personal history of pneumonia (recurrent): Secondary | ICD-10-CM | POA: Diagnosis not present

## 2011-07-11 DIAGNOSIS — J438 Other emphysema: Secondary | ICD-10-CM | POA: Diagnosis not present

## 2011-07-11 DIAGNOSIS — Z23 Encounter for immunization: Secondary | ICD-10-CM | POA: Diagnosis not present

## 2011-07-11 DIAGNOSIS — J449 Chronic obstructive pulmonary disease, unspecified: Secondary | ICD-10-CM | POA: Diagnosis not present

## 2011-07-11 DIAGNOSIS — M349 Systemic sclerosis, unspecified: Secondary | ICD-10-CM

## 2011-07-11 MED ORDER — PREDNISONE 5 MG PO TABS: ORAL_TABLET | ORAL | Status: DC

## 2011-07-11 NOTE — Patient Instructions (Signed)
Prednisone 5 mg pill: 1 pill alternating with 1/2 pill every other day for 3 weeks, and if okay then change to 1/2 pill daily  Follow up in 4 to 6 months

## 2011-07-11 NOTE — Assessment & Plan Note (Signed)
She is followed by Dr. Anderson with Rheumatology. 

## 2011-07-11 NOTE — Progress Notes (Signed)
Chief Complaint  Patient presents with  . Follow-up    Pt states her breathing has been "okay". Pt c/o cough w/ white phlem, PND, stuffy nose. Pt had PNA in december and is here to f/u    History of Present Illness: [name] is a 78 y.o. 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  She was treated for pneumonia in the hospital in December.  She has been doing better since.  She still has cough with clear sputum.  She denies wheeze, fever, chest pain, or hemoptysis.  She remains on prednisone 5 mg daily.   Past Medical History  Diagnosis Date  . Coronary artery disease   . Dressler's syndrome   . Hypertension   . Dyslipidemia   . Diabetes mellitus   . CVA (cerebral infarction) 1991  . Macular degeneration   . Depression   . GERD (gastroesophageal reflux disease)   . Diverticulosis   . IBS (irritable bowel syndrome)   . Zenker's diverticulum   . Emphysema   . Pulmonary hypertension   . Chronic sinusitis   . Fibromyalgia   . Hypothyroidism   . Scleroderma     followed by Dr. Dareen Piano  . Sjogren's disease   . Raynaud's phenomenon   . Cervical disc disease   . Adenomatous colon polyp   . Scleroderma   . COPD (chronic obstructive pulmonary disease)   . Arthritis   . Blood transfusion   . Myocardial infarction may 27th 2007    Past Surgical History  Procedure Date  . Total abdominal hysterectomy 1973  . Bilateral oophorectomy 2002  . Spinal fusion 1997    and implantation of a plate   . Lobe thyroid removal   . Thyroid lobectomy 1991    removal of left lobe thyroid  . Cholecystectomy 1965  . Appendectomy   . Cataract extraction   . Bronchoscopy 02-22-09  . Stent surgery     x3    Allergies  Allergen Reactions  . Lopressor Hct     REACTION: decreased bp  . Penicillins     REACTION: hives  . Sulfonamide Derivatives     REACTION: hives  . Titanium     Cervical  plate - had to change for stainless steel    Physical Exam:  Blood pressure 118/52, pulse 88, temperature 98.8 F (37.1 C), temperature source Oral, height 5\' 3"  (1.6 m), weight 134 lb 12.8 oz (61.145 kg), SpO2 97.00%. Body mass index is 23.88 kg/(m^2). Wt Readings from Last 2 Encounters:  07/11/11 134 lb 12.8 oz (61.145 kg)  05/28/11 139 lb 8.8 oz (63.3 kg)   General - Healthy appearing  HEENT - no sinus tenderness, clear nasal discharge, no oral exudate, no thrush, stable tongue lesion, no LAN  Cardiac - s1s2 regular, no murmur  Chest - no wheeze/rales  Abd - soft, nontender  Ext - no edema, skin tightening over fingers  Neuro - normal strength  Psych - normal mood/behavior  Dg Chest 2 View  07/11/2011  *RADIOLOGY REPORT*  Clinical Data: History pneumonia.  COPD.  CHEST - 2 VIEW  Comparison: PA and lateral chest 12/04 04/12 and 06/23/2010.  Findings: Right side airspace disease seen on the prior study has resolved.  Lungs appear emphysematous but clear.  Heart size is normal.  No pneumothorax or effusion.  IMPRESSION: Interval resolution of right side pneumonia.  Emphysema.  Original Report Authenticated By: Bernadene Bell.  Maricela Curet, M.D.     Assessment/Plan:  Outpatient Encounter Prescriptions as of 07/11/2011  Medication Sig Dispense Refill  . amLODipine (NORVASC) 10 MG tablet Once a day      . aspirin 325 MG tablet Take 325 mg by mouth 2 (two) times daily.       [name] esomeprazole (NEXIUM) 40 MG capsule Take 1 capsule (40 mg total) by mouth daily before breakfast.  30 capsule  6  . imipramine (TOFRANIL) 25 MG tablet Take 75 mg by mouth at bedtime.       . insulin glargine (LANTUS) 100 UNIT/ML injection Inject into the skin at bedtime. 8-12 untis       . levothyroxine (SYNTHROID, LEVOTHROID) 125 MCG tablet Take 125 mcg by mouth daily.       . mometasone (NASONEX) 50 MCG/ACT nasal spray Place 2 sprays into the nose daily. As needed for congestion.       . nitroGLYCERIN (NITROSTAT) 0.4  MG SL tablet Place 0.4 mg under the tongue every 5 (five) minutes as needed.        . predniSONE (DELTASONE) 5 MG tablet Take 5 mg by mouth daily.       [name] senna (SENOKOT) 8.6 MG tablet Take 1 tablet by mouth daily as needed. As needed for constipation.      . ticlopidine (TICLID) 250 MG tablet Take 250 mg by mouth 2 (two) times daily.       . triazolam (HALCION) 0.25 MG tablet Take 0.25 mg by mouth at bedtime as needed.        [name] DISCONTD: chlorpheniramine-HYDROcodone (TUSSIONEX) 10-8 MG/5ML LQCR Take 5 mLs by mouth every 12 (twelve) hours as needed (for cough).  140 mL  0  . DISCONTD: loratadine (CLARITIN) 10 MG tablet Take 1 tablet (10 mg total) by mouth daily.  60 tablet  1    Fayette Gasner Pager:  [phone] 07/11/2011, 12:07 PM

## 2011-07-11 NOTE — Assessment & Plan Note (Signed)
Has been maintained on low dose prednisone.  Will try to taper her prednisone further as tolerated.  Will give her flu shot today.

## 2011-07-11 NOTE — Assessment & Plan Note (Signed)
She had recent pneumonia in December 2012 which has resolved on today's chest xray.

## 2011-07-14 ENCOUNTER — Other Ambulatory Visit: Payer: Self-pay | Admitting: *Deleted

## 2011-07-14 MED ORDER — TICLOPIDINE HCL 250 MG PO TABS: 250.0000 mg | ORAL_TABLET | Freq: Two times a day (BID) | ORAL | Status: DC

## 2011-09-11 ENCOUNTER — Encounter: Payer: Self-pay | Admitting: Internal Medicine

## 2011-09-11 DIAGNOSIS — E039 Hypothyroidism, unspecified: Secondary | ICD-10-CM | POA: Diagnosis not present

## 2011-09-11 DIAGNOSIS — IMO0001 Reserved for inherently not codable concepts without codable children: Secondary | ICD-10-CM | POA: Diagnosis not present

## 2011-10-23 ENCOUNTER — Encounter (INDEPENDENT_AMBULATORY_CARE_PROVIDER_SITE_OTHER): Payer: Self-pay | Admitting: Surgery

## 2011-10-23 ENCOUNTER — Ambulatory Visit (INDEPENDENT_AMBULATORY_CARE_PROVIDER_SITE_OTHER): Payer: Medicare Other | Admitting: Surgery

## 2011-10-23 VITALS — HR 88 | Temp 96.4°F | Resp 20 | Ht 62.5 in | Wt 134.2 lb

## 2011-10-23 DIAGNOSIS — K56609 Unspecified intestinal obstruction, unspecified as to partial versus complete obstruction: Secondary | ICD-10-CM

## 2011-10-23 DIAGNOSIS — K5730 Diverticulosis of large intestine without perforation or abscess without bleeding: Secondary | ICD-10-CM

## 2011-10-23 DIAGNOSIS — K56699 Other intestinal obstruction unspecified as to partial versus complete obstruction: Secondary | ICD-10-CM

## 2011-10-23 DIAGNOSIS — K5909 Other constipation: Secondary | ICD-10-CM | POA: Diagnosis not present

## 2011-10-23 NOTE — Progress Notes (Signed)
Subjective:     Patient ID: [name], female   DOB: Mar 02, 1931, 78 y.o.   MRN: [id number]  HPI   Patient Care Team: Rama ([name]) Sofie Hartigan as PCP - General (Internal Medicine) Mardella Layman, MD as Consulting Physician (Gastroenterology) Coralyn Helling, MD as Consulting Physician (Pulmonary Disease) Pricilla Riffle, MD as Consulting Physician (Cardiology)  This patient is a 78 y.o.female who presents today for surgical evaluation.   She was sent by her gastroenterologist to consider surgery.  She is a pleasant elderly woman with numerous medical problems. She has struggled with major constipation for several years. She claims has had numerous prior abdominal surgeries . I could only find appendectomy, cholecystectomy, hysterectomy.  She's been told that she has a moderate amount of adhesions.   The pictures that she shows name revealed just a few locations but not a dense wad nor a large region of the peritoneum involved.  She has been a difficult endoscopy patient in the past. She has known diverticulosis. She had a colonoscopy 2011 by Dr. Arlyce Dice with [facility] which showed a probable lipoma in her transverse colon and a pretty significant stricture at around 50 cm from the anus. She's been on numerous bowel regimens. She's been to the ER and had to be cleaned out. It has been a struggle for her. She has a lot of left-sided abdominal pain. Because of her numerous medical conditions, they've been hesitant to recommend surgery. However her symptoms have progressively worsened. She gets a bowel movement once a day. She is failed numerous oral medicines to try and help improve things.   She claims that she is to take a laxative every day. She still moves her bowels once a week. It's usually liquid and small volume.  Because of the worsening stricture and no proof of malignancy, it is been recommended that she consider surgery. She has steroid-dependent COPD and ?scleroderma. She has  coronary disease and has had stents placed. The last one was last year. She is on chronic anticoagulation (Ticlid).   I saw her 6 months ago. I recommend she consider surgery given all her abdominal complaints and an obvious stricture. She initially seemed up into it. She got clearance from pulmonary and cardiology. She then changed her mind and refused to have surgery. She comes in today with her daughter. Her daughter is leaning more towards her having surgery. Patient notes she is taking care of grandkids and doesn't know when she will have time for surgery.  However, she does still get pain on her left side regularly. She still has severe constipation. Things have not gotten markedly worse; however, they have not improved.  She remains quite active. Says she is very busy with life.  She did get admitted for pneumonia in December but has recovered from that.   Walks pretty well. Feels like her exercise tolerance has improved   Past Medical History  Diagnosis Date  . Coronary artery disease   . Dressler's syndrome   . Hypertension   . Dyslipidemia   . Diabetes mellitus   . CVA (cerebral infarction) 1991  . Macular degeneration   . Depression   . GERD (gastroesophageal reflux disease)   . Diverticulosis   . IBS (irritable bowel syndrome)   . Zenker's diverticulum   . Emphysema   . Pulmonary hypertension   . Chronic sinusitis   . Fibromyalgia   . Hypothyroidism   . Scleroderma     followed by Dr. Dareen Piano  .  Sjogren's disease   . Raynaud's phenomenon   . Cervical disc disease   . Adenomatous colon polyp   . Scleroderma   . COPD (chronic obstructive pulmonary disease)   . Arthritis   . Blood transfusion   . Myocardial infarction may 27th 2007    Past Surgical History  Procedure Date  . Total abdominal hysterectomy 1973  . Bilateral oophorectomy 2002  . Spinal fusion 1997    and implantation of a plate   . Lobe thyroid removal   . Thyroid lobectomy 1991    removal of left  lobe thyroid  . Cholecystectomy 1965  . Appendectomy   . Cataract extraction   . Bronchoscopy 02-22-09  . Stent surgery     x3    History   Social History  . Marital Status: Divorced    Spouse Name: N/A    Number of Children: N/A  . Years of Education: N/A   Occupational History  . retired    Social History Main Topics  . Smoking status: Former Smoker -- 1.0 packs/day for 40 years    Types: Cigarettes    Quit date: 06/26/1988  . Smokeless tobacco: Not on file   Comment: 40 pack years  . Alcohol Use: No  . Drug Use: No  . Sexually Active: Not on file   Other Topics Concern  . Not on file   Social History Narrative  . No narrative on file    Family History  Problem Relation Age of Onset  . Other Mother 36    Died from sepsis  . Stroke Father 53    died  . Heart disease Father   . Cancer Sister     lung  . Cancer Brother     lung    Current outpatient prescriptions:amLODipine (NORVASC) 10 MG tablet, Once a day, Disp: , Rfl: ;  aspirin 325 MG tablet, Take 325 mg by mouth 2 (two) times daily. , Disp: , Rfl: ;  esomeprazole (NEXIUM) 40 MG capsule, Take 1 capsule (40 mg total) by mouth daily before breakfast., Disp: 30 capsule, Rfl: 6;  imipramine (TOFRANIL) 25 MG tablet, Take 75 mg by mouth at bedtime. , Disp: , Rfl:  insulin glargine (LANTUS) 100 UNIT/ML injection, Inject into the skin at bedtime. 8-12 untis , Disp: , Rfl: ;  levothyroxine (SYNTHROID, LEVOTHROID) 125 MCG tablet, Take 125 mcg by mouth daily. , Disp: , Rfl: ;  mometasone (NASONEX) 50 MCG/ACT nasal spray, Place 2 sprays into the nose daily. As needed for congestion. , Disp: , Rfl:  nitroGLYCERIN (NITROSTAT) 0.4 MG SL tablet, Place 0.4 mg under the tongue every 5 (five) minutes as needed.  , Disp: , Rfl: ;  predniSONE (DELTASONE) 5 MG tablet, Use as directed., Disp: , Rfl: ;  senna (SENOKOT) 8.6 MG tablet, Take 1 tablet by mouth daily as needed. As needed for constipation., Disp: , Rfl: ;  ticlopidine  (TICLID) 250 MG tablet, Take 1 tablet (250 mg total) by mouth 2 (two) times daily., Disp: 60 tablet, Rfl: 5 triazolam (HALCION) 0.25 MG tablet, Take 0.25 mg by mouth at bedtime as needed.  , Disp: , Rfl:   Allergies  Allergen Reactions  . Dutoprol     REACTION: decreased bp  . Penicillins     REACTION: hives  . Sulfonamide Derivatives     REACTION: hives  . Titanium     Cervical plate - had to change for stainless steel       Review of  Systems  Constitutional: Negative for fever, chills, diaphoresis, activity change, appetite change, fatigue and unexpected weight change.  HENT: Negative for ear pain, sore throat, trouble swallowing, neck pain and ear discharge.   Eyes: Positive for visual disturbance. Negative for photophobia, pain, discharge and itching.  Respiratory: Positive for shortness of breath. Negative for cough, choking, chest tightness, wheezing and stridor.   Cardiovascular: Negative for chest pain, palpitations and leg swelling.  Gastrointestinal: Positive for abdominal pain, constipation and abdominal distention. Negative for nausea, vomiting, diarrhea, blood in stool, anal bleeding and rectal pain.       BM once a week with laxatives  Genitourinary: Negative for dysuria, frequency, enuresis, difficulty urinating and pelvic pain.  Musculoskeletal: Negative for myalgias and gait problem.  Skin: Negative for color change, pallor and rash.  Neurological: Negative for dizziness, speech difficulty, weakness and numbness.  Hematological: Negative for adenopathy.  Psychiatric/Behavioral: Negative for confusion and agitation. The patient is not nervous/anxious.        Objective:   Physical Exam  Constitutional: She is oriented to person, place, and time. She appears well-developed and well-nourished. No distress.  HENT:  Head: Normocephalic.  Mouth/Throat: Oropharynx is clear and moist. No oropharyngeal exudate.  Eyes: Conjunctivae, EOM and lids are normal. Pupils are  equal, round, and reactive to light. Right conjunctiva is not injected. Left conjunctiva is not injected. No scleral icterus.       Sees poorly  Neck: Normal range of motion. Neck supple. No tracheal deviation present.  Cardiovascular: Normal rate, regular rhythm and intact distal pulses.   Pulmonary/Chest: Effort normal and breath sounds normal. No respiratory distress. She exhibits no tenderness.  Abdominal: Soft. She exhibits no distension and no mass. There is no tenderness. There is no rebound and no guarding. Hernia confirmed negative in the right inguinal area and confirmed negative in the left inguinal area.         Old incisions.  No hernias.  Mild L sided abd discomfort.  Mildly overweight.  Genitourinary: No vaginal discharge found.  Musculoskeletal: Normal range of motion. She exhibits no edema and no tenderness.  Lymphadenopathy:    She has no cervical adenopathy.       Right: No inguinal adenopathy present.       Left: No inguinal adenopathy present.  Neurological: She is alert and oriented to person, place, and time. No cranial nerve deficit. She exhibits normal muscle tone. Coordination normal.  Skin: Skin is warm and dry. No rash noted. She is not diaphoretic. No erythema.  Psychiatric: Her speech is normal and behavior is normal. Judgment and thought content normal. Her mood appears anxious. Her affect is blunt. Her affect is not labile. She is not agitated and not combative. Cognition and memory are not impaired. She does not exhibit a depressed mood.   CT enterography from 2011 notes probable stricture in a redundant sigmoid colon near the suprapubic region. Dilated colon proximally. Probable lipoma in the mid to distal transverse colon.  Numerous colonoscopies. Stricture of 50 cm from anus.  I cannot find any pathology reports on of this stricture    Assessment:     Worsening constipation and known colon stricture in a medically fragile patient.  Severe constipation  still with a bowel regimen. Compliance issue. Still with regular left-sided abdominal pain    Plan:     Had a long discussion with the patient & her daughter.  There is no easy, clearcut answer.  I think she would benefit from partial  colectomy to remove the strictured segment. She seems to have plenty of redundant colon so should be able to get good lymph node sampling to rule out malignancy. I doubt that I will be able to get the colon lipoma in this as well unless it is closer than billed.  She's had complications with surgery to the past. She is insulin-requiring and on steroids, so the risk of leak is higher than average. Her proclaimed allergy to titanium makes it more likely I will have to use a handsewn anastomosis and try to avoid staples if possible. She's had numerous abdominal surgeries so she may not be able to be done laparoscopically. However, she never had bowel obstructions and she is not particularly large; so, I think it is reasonable to start with a laparoscopic approach. I would have a low threshold using HandPort. I would like to try and keep incision small to minimize infection and hernia risks.  She has medical, pulmonary, and cardiology clearance.  All feel it is safe to proceed.  She understands she is at increased risk. However, she's having dwindling tolerance to her abdominal issues. She is not had any improvement in the past 6 months with nonoperative management. I worry she could get completely obstructed or perforate.  I noted that she could have a cancer there. The only way to know as to remove the whole section.  The anatomy & physiology of the digestive tract was discussed.  The pathophysiology was discussed.  Natural history risks without surgery was discussed.   I feel the risks of no intervention will lead to serious problems that outweigh the operative risks; therefore, I recommended a partial colectomy to remove the pathology.  Laparoscopic & open techniques were  discussed.   Risks such as bleeding, infection, abscess, leak, reoperation, possible ostomy, hernia, heart attack, death, and other risks were discussed.  Goals of post-operative recovery were discussed as well.  We will work to minimize complications.  An educational handout on the pathology was given as well.  Questions were answered.  The patient expresses understanding & wishes to proceed with surgery.  I also stressed to her that she will be in the hospital for at least a week. Given her numerous comorbidities, she is at risk for staying several weeks or more. She notes she had complications with her cervical spine surgery & in the hospital for 3 months. I cannot guarantee she will avoid problems. I noted it is not realistic to go home and be by herself and she was insisted she would. She will need at least nursing and family help. She may need to be at a skilled nursing facility. I think she understands that this is significant operation. She is aware of the risks.   She is now leaning toward surgery.  Her daughter feels convinced that the patient needs surgery.  I encouraged her to call me or have family call me if there are further questions.

## 2011-10-23 NOTE — Patient Instructions (Signed)
GETTING TO GOOD BOWEL HEALTH. Irregular bowel habits such as constipation and diarrhea can lead to many problems over time.  Having one soft bowel movement a day is the most important way to prevent further problems.  The anorectal canal is designed to handle stretching and feces to safely manage our ability to get rid of solid waste (feces, poop, stool) out of our body.  BUT, hard constipated stools can act like ripping concrete bricks and diarrhea can be a burning fire to this very sensitive area of our body, causing inflamed hemorrhoids, anal fissures, increasing risk is perirectal abscesses, abdominal pain/bloating, an making irritable bowel worse.     The goal: ONE SOFT BOWEL MOVEMENT A DAY!  To have soft, regular bowel movements:    Drink at least 8 tall glasses of water a day.     Take plenty of fiber.  Fiber is the undigested part of plant food that passes into the colon, acting s "natures broom" to encourage bowel motility and movement.  Fiber can absorb and hold large amounts of water. This results in a larger, bulkier stool, which is soft and easier to pass. Work gradually over several weeks up to 6 servings a day of fiber (25g a day even more if needed) in the form of: o Vegetables -- Root (potatoes, carrots, turnips), leafy green (lettuce, salad greens, celery, spinach), or cooked high residue (cabbage, broccoli, etc) o Fruit -- Fresh (unpeeled skin & pulp), Dried (prunes, apricots, cherries, etc ),  or stewed ( applesauce)  o Whole grain breads, pasta, etc (whole wheat)  o Bran cereals    Bulking Agents -- This type of water-retaining fiber generally is easily obtained each day by one of the following:  o Psyllium bran -- The psyllium plant is remarkable because its ground seeds can retain so much water. This product is available as Metamucil, Konsyl, Effersyllium, Per Diem Fiber, or the less expensive generic preparation in drug and health food stores. Although labeled a laxative, it really  is not a laxative.  o Methylcellulose -- This is another fiber derived from wood which also retains water. It is available as Citrucel. o Polyethylene Glycol - and "artificial" fiber commonly called Miralax or Glycolax.  It is helpful for people with gassy or bloated feelings with regular fiber o Flax Seed - a less gassy fiber than psyllium   No reading or other relaxing activity while on the toilet. If bowel movements take longer than 5 minutes, you are too constipated   AVOID CONSTIPATION.  High fiber and water intake usually takes care of this.  Sometimes a laxative is needed to stimulate more frequent bowel movements, but    Laxatives are not a good long-term solution as it can wear the colon out. o Osmotics (Milk of Magnesia, Fleets phosphosoda, Magnesium citrate, MiraLax, GoLytely) are safer than  o Stimulants (Senokot, Castor Oil, Dulcolax, Ex Lax)    o Do not take laxatives for more than 7days in a row.    IF SEVERELY CONSTIPATED, try a Bowel Retraining Program: o Do not use laxatives.  o Eat a diet high in roughage, such as bran cereals and leafy vegetables.  o Drink six (6) ounces of prune or apricot juice each morning.  o Eat two (2) large servings of stewed fruit each day.  o Take one (1) heaping tablespoon of a psyllium-based bulking agent twice a day. Use sugar-free sweetener when possible to avoid excessive calories.  o Eat a normal breakfast.  o   Set aside 15 minutes after breakfast to sit on the toilet, but do not strain to have a bowel movement.  o If you do not have a bowel movement by the third day, use an enema and repeat the above steps.    Controlling diarrhea o Switch to liquids and simpler foods for a few days to avoid stressing your intestines further. o Avoid dairy products (especially milk & ice cream) for a short time.  The intestines often can lose the ability to digest lactose when stressed. o Avoid foods that cause gassiness or bloating.  Typical foods include  beans and other legumes, cabbage, broccoli, and dairy foods.  Every person has some sensitivity to other foods, so listen to our body and avoid those foods that trigger problems for you. o Adding fiber (Citrucel, Metamucil, psyllium, Miralax) gradually can help thicken stools by absorbing excess fluid and retrain the intestines to act more normally.  Slowly increase the dose over a few weeks.  Too much fiber too soon can backfire and cause cramping & bloating. o Probiotics (such as active yogurt, Align, etc) may help repopulate the intestines and colon with normal bacteria and calm down a sensitive digestive tract.  Most studies show it to be of mild help, though, and such products can be costly. o Medicines:   Bismuth subsalicylate (ex. Kayopectate, Pepto Bismol) every 30 minutes for up to 6 doses can help control diarrhea.  Avoid if pregnant.   Loperamide (Immodium) can slow down diarrhea.  Start with two tablets (4mg  total) first and then try one tablet every 6 hours.  Avoid if you are having fevers or severe pain.  If you are not better or start feeling worse, stop all medicines and call your doctor for advice o Call your doctor if you are getting worse or not better.  Sometimes further testing (cultures, endoscopy, X-ray studies, bloodwork, etc) may be needed to help diagnose and treat the cause of the diarrhea. o   Diverticulosis Diverticulosis is a common condition that develops when small pouches (diverticula) form in the wall of the colon. The risk of diverticulosis increases with age. It happens more often in people who eat a low-fiber diet. Most individuals with diverticulosis have no symptoms. Those individuals with symptoms usually experience abdominal pain, constipation, or loose stools (diarrhea). HOME CARE INSTRUCTIONS   Increase the amount of fiber in your diet as directed by your caregiver or dietician. This may reduce symptoms of diverticulosis.   Your caregiver may recommend taking  a dietary fiber supplement.   Drink at least 6 to 8 glasses of water each day to prevent constipation.   Try not to strain when you have a bowel movement.   Your caregiver may recommend avoiding nuts and seeds to prevent complications, although this is still an uncertain benefit.   Only take over-the-counter or prescription medicines for pain, discomfort, or fever as directed by your caregiver.  FOODS WITH HIGH FIBER CONTENT INCLUDE:  Fruits. Apple, peach, pear, tangerine, raisins, prunes.   Vegetables. Brussels sprouts, asparagus, broccoli, cabbage, carrot, cauliflower, romaine lettuce, spinach, summer squash, tomato, winter squash, zucchini.   Starchy Vegetables. Baked beans, kidney beans, lima beans, split peas, lentils, potatoes (with skin).   Grains. Whole wheat bread, brown rice, bran flake cereal, plain oatmeal, white rice, shredded wheat, bran muffins.  SEEK IMMEDIATE MEDICAL CARE IF:   You develop increasing pain or severe bloating.   You have an oral temperature above 102 F (38.9 C), not controlled by  medicine.   You develop vomiting or bowel movements that are bloody or black.  Document Released: 03/09/2004 Document Revised: 06/01/2011 Document Reviewed: 11/10/2009 [facility] Patient Information 2012 [place], [place].

## 2011-11-06 ENCOUNTER — Ambulatory Visit (INDEPENDENT_AMBULATORY_CARE_PROVIDER_SITE_OTHER): Payer: Medicare Other | Admitting: Pulmonary Disease

## 2011-11-06 ENCOUNTER — Encounter: Payer: Self-pay | Admitting: Pulmonary Disease

## 2011-11-06 VITALS — BP 132/68 | HR 86 | Temp 98.1°F | Ht 62.5 in | Wt 133.8 lb

## 2011-11-06 DIAGNOSIS — M349 Systemic sclerosis, unspecified: Secondary | ICD-10-CM

## 2011-11-06 DIAGNOSIS — J8409 Other alveolar and parieto-alveolar conditions: Secondary | ICD-10-CM

## 2011-11-06 DIAGNOSIS — K56609 Unspecified intestinal obstruction, unspecified as to partial versus complete obstruction: Secondary | ICD-10-CM

## 2011-11-06 DIAGNOSIS — J438 Other emphysema: Secondary | ICD-10-CM | POA: Diagnosis not present

## 2011-11-06 DIAGNOSIS — J310 Chronic rhinitis: Secondary | ICD-10-CM | POA: Diagnosis not present

## 2011-11-06 DIAGNOSIS — J8489 Other specified interstitial pulmonary diseases: Secondary | ICD-10-CM

## 2011-11-06 DIAGNOSIS — K56699 Other intestinal obstruction unspecified as to partial versus complete obstruction: Secondary | ICD-10-CM

## 2011-11-06 NOTE — Assessment & Plan Note (Signed)
Depending on her symptoms status will need to consider repeating PFT at her next follow up.   

## 2011-11-06 NOTE — Assessment & Plan Note (Signed)
She is followed by Dr. Anderson with Rheumatology. 

## 2011-11-06 NOTE — Assessment & Plan Note (Signed)
Will monitor her status off prednisone.  Will repeat chest xray if her symptoms recur off prednisone.

## 2011-11-06 NOTE — Progress Notes (Signed)
Chief Complaint  Patient presents with  . Follow-up    Pt states her breathing has been okay. Pt c/o HA, blowing out yellow phlem, sinus pressure, left ear pain, cough w/ phlem, nasal congestion, PND x 1 week. Pt currently augmentin by pcp a    History of Present Illness: [name] is a 78 y.o. 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  She was started on augmentin by her PCP one week ago for a sinus infection.  She has slowly been getting better.  Her daughter read on the internet that Wegner's can be associated with chronic sinus disease.  She is also followed by Dr. Jearld Fenton for her sinuses.  She was able to wean herself off prednisone.  Her last dose of prednisone was one week ago.  She is worried about the potential eye side effects with prednisone.  She is scheduled for f/u with ophthalmology at [facility].    She denies fever, or chest pain.  She has not had cough or wheeze.  She did feel like she had more energy when using prednisone.    She is schedule for partial colon resection surgery later this month with Dr. Michaell Cowing.  She is followed by Dr. Dareen Piano for her scleroderma.  She has not had any change to therapy recently for this.   Past Medical History  Diagnosis Date  . Coronary artery disease   . Dressler's syndrome   . Hypertension   . Dyslipidemia   . Diabetes mellitus   . CVA (cerebral infarction) 1991  . Macular degeneration   . Depression   . GERD (gastroesophageal reflux disease)   . Diverticulosis   . IBS (irritable bowel syndrome)   . Zenker's diverticulum   . Emphysema   . Pulmonary hypertension   . Chronic sinusitis   . Fibromyalgia   . Hypothyroidism   . Scleroderma     followed by Dr. Dareen Piano  . Sjogren's disease   . Raynaud's phenomenon   . Cervical disc disease   . Adenomatous colon polyp   . Scleroderma   . COPD (chronic obstructive pulmonary disease)   .  Arthritis   . Blood transfusion   . Myocardial infarction may 27th 2007    Past Surgical History  Procedure Date  . Total abdominal hysterectomy 1973  . Bilateral oophorectomy 2002  . Spinal fusion 1997    and implantation of a plate   . Lobe thyroid removal   . Thyroid lobectomy 1991    removal of left lobe thyroid  . Cholecystectomy 1965  . Appendectomy   . Cataract extraction   . Bronchoscopy 02-22-09  . Stent surgery     x3    Allergies  Allergen Reactions  . Metoprolol-Hydrochlorothiazide     REACTION: decreased bp  . Penicillins     REACTION: hives  . Sulfonamide Derivatives     REACTION: hives  . Titanium     Cervical plate - had to change for stainless steel    Physical Exam:  Blood pressure 132/68, pulse 86, temperature 98.1 F (36.7 C), temperature source Oral, height 5' 2.5" (1.588 m), weight 133 lb 12.8 oz (60.691 kg), SpO2 95.00%. Body mass index is 24.08 kg/(m^2).  Wt Readings from Last 2 Encounters:  11/06/11 133 lb 12.8 oz (60.691 kg)  10/23/11 134 lb 3.2 oz (60.873 kg)    General - Healthy appearing  HEENT - mild sinus tenderness, clear nasal  discharge, no oral exudate, no thrush, stable tongue lesion, no LAN  Cardiac - s1s2 regular, no murmur  Chest - no wheeze/rales  Abd - soft, nontender  Ext - no edema, skin tightening over fingers  Neuro - normal strength  Psych - normal mood/behavior   Assessment/Plan:  Outpatient Encounter Prescriptions as of 11/06/2011  Medication Sig Dispense Refill  . amLODipine (NORVASC) 10 MG tablet Once a day      . amoxicillin-clavulanate (AUGMENTIN) 500-125 MG per tablet 1 tablet twice a day      . aspirin 325 MG tablet Take 325 mg by mouth 2 (two) times daily.       [name] esomeprazole (NEXIUM) 40 MG capsule Take 1 capsule (40 mg total) by mouth daily before breakfast.  30 capsule  6  . guaiFENesin (MUCINEX) 600 MG 12 hr tablet Take 600 mg by mouth 2 (two) times daily.      [name] imipramine (TOFRANIL) 25 MG tablet  Take 75 mg by mouth at bedtime.       . insulin glargine (LANTUS) 100 UNIT/ML injection Inject into the skin at bedtime. 8-12 untis       . levothyroxine (SYNTHROID, LEVOTHROID) 125 MCG tablet Take 125 mcg by mouth daily.       . mometasone (NASONEX) 50 MCG/ACT nasal spray Place 2 sprays into the nose daily. As needed for congestion.       . nitroGLYCERIN (NITROSTAT) 0.4 MG SL tablet Place 0.4 mg under the tongue every 5 (five) minutes as needed.        . senna (SENOKOT) 8.6 MG tablet Take 1 tablet by mouth daily as needed. As needed for constipation.      . ticlopidine (TICLID) 250 MG tablet Take 1 tablet (250 mg total) by mouth 2 (two) times daily.  60 tablet  5  . triazolam (HALCION) 0.25 MG tablet Take 0.25 mg by mouth at bedtime as needed.        [name] DISCONTD: predniSONE (DELTASONE) 5 MG tablet Use as directed.        Lakaya Tolen Pager:  [phone] 11/06/2011, 9:36 AM

## 2011-11-06 NOTE — Assessment & Plan Note (Signed)
She is to finish her course of antibiotics prescribed by her PCP.    I have explained that it is not uncommon to have chronic sinus problems.  While Wegener's is in the differential for this, there hasn't been any clear evidence otherwise to suggest that we should be worried about Wegener's causing her sinus disease.

## 2011-11-06 NOTE — Patient Instructions (Signed)
Follow up in 3 months

## 2011-11-06 NOTE — Assessment & Plan Note (Signed)
She is schedule for partial colon resection later this month with Dr. Michaell Cowing.  I have advised her to d/w Dr. Michaell Cowing whether she needs to have her surgery date pushed back if her sinus disease is not improved.

## 2011-11-08 ENCOUNTER — Telehealth (INDEPENDENT_AMBULATORY_CARE_PROVIDER_SITE_OTHER): Payer: Self-pay | Admitting: Surgery

## 2011-11-08 NOTE — Telephone Encounter (Signed)
Called pt to let her know that I did notify Dr Michaell Cowing about her having a sinus infection that Dr Craige Cotta is treating her for now. Dr Michaell Cowing said we didn't have to do anything different with her surgery for now b/c hopefull she will get better before her sx on 12/14/11. I did advised the pt that if she is not better by the first week in June to call me back b/c we might have to r/s her sx date. The pt understands.

## 2011-11-21 ENCOUNTER — Telehealth: Payer: Self-pay | Admitting: Pulmonary Disease

## 2011-11-21 ENCOUNTER — Ambulatory Visit (INDEPENDENT_AMBULATORY_CARE_PROVIDER_SITE_OTHER)
Admission: RE | Admit: 2011-11-21 | Discharge: 2011-11-21 | Disposition: A | Discharge status: Home / Self Care | Payer: Medicare Other | Source: Ambulatory Visit | Attending: Adult Health | Admitting: Adult Health

## 2011-11-21 ENCOUNTER — Encounter: Payer: Self-pay | Admitting: Adult Health

## 2011-11-21 ENCOUNTER — Ambulatory Visit (INDEPENDENT_AMBULATORY_CARE_PROVIDER_SITE_OTHER): Payer: Medicare Other | Admitting: Adult Health

## 2011-11-21 DIAGNOSIS — R0602 Shortness of breath: Secondary | ICD-10-CM | POA: Diagnosis not present

## 2011-11-21 DIAGNOSIS — J479 Bronchiectasis, uncomplicated: Secondary | ICD-10-CM | POA: Diagnosis not present

## 2011-11-21 DIAGNOSIS — J438 Other emphysema: Secondary | ICD-10-CM | POA: Diagnosis not present

## 2011-11-21 DIAGNOSIS — J189 Pneumonia, unspecified organism: Secondary | ICD-10-CM | POA: Insufficient documentation

## 2011-11-21 DIAGNOSIS — R05 Cough: Secondary | ICD-10-CM | POA: Diagnosis not present

## 2011-11-21 MED ORDER — LEVOFLOXACIN 500 MG PO TABS: 500.0000 mg | ORAL_TABLET | Freq: Every day | ORAL | Status: AC

## 2011-11-21 NOTE — Progress Notes (Signed)
  Subjective:    Patient ID: [name], female    DOB: [birth year], 78 y.o.   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  11/21/2011 Acute OV  Complains of chest congestion, prod cough with yellow mucus, it " hurts so bad when I cough," increased SOB, body aches, fatigue x1week. Finished augmentin by PCP 10days ago Sinus pain and pressure. Post nasal drip is worse.  Painful cough .  No hemoptysis or fever  Currently on prednisone 5mg  daily       Review of Systems Constitutional:   No  weight loss, night sweats,  Fevers, chills,  +fatigue, or  lassitude.  HEENT:   No headaches,  Difficulty swallowing,  Tooth/dental problems, or  Sore throat,                No sneezing, itching, ear ache,  +nasal congestion, post nasal drip,   CV:  No chest pain,  Orthopnea, PND, swelling in lower extremities, anasarca, dizziness, palpitations, syncope.   GI  No heartburn, indigestion, abdominal pain, nausea, vomiting, diarrhea, change in bowel habits, loss of appetite, bloody stools.   Resp:    No coughing up of blood.     No chest wall deformity  Skin: no rash or lesions.  GU: no dysuria, change in color of urine, no urgency or frequency.  No flank pain, no hematuria   MS:  No joint pain or swelling.  No decreased range of motion.  No back pain.  Psych:  No change in mood or affect. No depression or anxiety.  No memory loss.         Objective:   Physical Exam GEN: A/Ox3; pleasant , NAD , elderly   HEENT:  [place]/AT,  EACs-clear, TMs-wnl, NOSE-clear drainage , max sinus tenderness , THROAT-clear, no lesions, no postnasal drip or exudate noted.   NECK:  Supple w/ fair ROM; no JVD; normal carotid impulses w/o bruits; no thyromegaly or nodules palpated; no lymphadenopathy.  RESP  Coarse BS  w/o, wheezes/ rales/ or rhonchi.no accessory muscle use, no dullness to  percussion  CARD:  RRR, no m/r/g  , no peripheral edema, pulses intact, no cyanosis or clubbing.  GI:   Soft & nt; nml bowel sounds; no organomegaly or masses detected.  Musco: Warm bil, no deformities or joint swelling noted.   Neuro: alert, no focal deficits noted.    Skin: Warm, no lesions or rashes         Assessment & Plan:

## 2011-11-21 NOTE — Patient Instructions (Signed)
Levaquin 500mg  daily for 10 days  Mucinex DM Twice daily  As needed  Cough/congestion  Fluids and rest  Saline nasal rinses As needed   Increase Prednisone 10mg  daily x 1 week then back to 5mg  daily  Follow up Dr. Craige Cotta  As planned and As needed   Please contact office for sooner follow up if symptoms do not improve or worsen or seek emergency care

## 2011-11-21 NOTE — Assessment & Plan Note (Signed)
Flare  Xray pending   Plan:  Levaquin 500mg  daily for 10 days  Mucinex DM Twice daily  As needed  Cough/congestion  Fluids and rest  Saline nasal rinses As needed   Increase Prednisone 10mg  daily x 1 week then back to 5mg  daily  Follow up Dr. Craige Cotta  As planned and As needed   Please contact office for sooner follow up if symptoms do not improve or worsen or seek emergency care

## 2011-11-21 NOTE — Progress Notes (Signed)
Reviewed and agree with assessment/plan. 

## 2011-11-21 NOTE — Telephone Encounter (Signed)
Pt last seen by VS 5.13.13 for sinus.  Was already on augmentin at time of ov.  Called spoke with patient who reports is having chest congestion, prod cough with yellow mucus, it " hurts so bad when I cough," increased SOB, body aches, fatigue.  Denies wheezing.  Pt is requesting ov today.  Declined ov with TP in [place] this morning.  PEW with opening at 0930 but stated she is unable to make by that time.  Ov scheduled with TP this afternoon at 3pm in [place] office.  Advised pt to seek emergency assistance if her breathing worsens before appt.  Pt verbalized her understanding.

## 2011-11-22 ENCOUNTER — Telehealth (INDEPENDENT_AMBULATORY_CARE_PROVIDER_SITE_OTHER): Payer: Self-pay | Admitting: General Surgery

## 2011-11-22 ENCOUNTER — Other Ambulatory Visit: Payer: Self-pay | Admitting: Adult Health

## 2011-11-22 DIAGNOSIS — J189 Pneumonia, unspecified organism: Secondary | ICD-10-CM

## 2011-11-22 NOTE — Telephone Encounter (Signed)
MS Catherman CALLED TO INFORM DR. GROSS THAT SHE HAS PNEUMONIA IN RT LUNG PER PULMONOLOGIST DR. SOOD/[phone]. SHE IS CURRENTLY TAKING LEVAQUIN AND IS TO BE SCHEDULED FOR CT SCAN. SHE HAS A F/U APPT WITH DR. SOOD ON 12-06-11, SHE IS SCHEDULED FOR COLON SURGERY WITH DR. Michaell Cowing THE NEXT WEEK. I TOLD HER I WOULD INFORM DR. GROSS OF HER CONDITION AND THAT DR. SOOD WOULD NEED TO LET us KNOW IF SHE IS CLEARED FOR SURGERY. DR. Craige Cotta IS AWARE OF PT'S PENDING SURGERY./GY

## 2011-11-22 NOTE — Progress Notes (Signed)
Addended by: Boone Master E on: 11/22/2011 02:12 PM   Modules accepted: Orders

## 2011-11-23 ENCOUNTER — Other Ambulatory Visit: Payer: Medicare Other

## 2011-11-23 ENCOUNTER — Encounter: Payer: Self-pay | Admitting: Internal Medicine

## 2011-11-23 ENCOUNTER — Ambulatory Visit (INDEPENDENT_AMBULATORY_CARE_PROVIDER_SITE_OTHER): Payer: Medicare Other | Admitting: Internal Medicine

## 2011-11-23 VITALS — BP 130/63 | HR 82 | Ht 62.0 in | Wt 133.0 lb

## 2011-11-23 DIAGNOSIS — I4891 Unspecified atrial fibrillation: Secondary | ICD-10-CM

## 2011-11-23 DIAGNOSIS — E7800 Pure hypercholesterolemia, unspecified: Secondary | ICD-10-CM | POA: Diagnosis not present

## 2011-11-23 LAB — CBC WITH DIFFERENTIAL/PLATELET
Basophils Absolute: 0.1 10*3/uL (ref 0.0–0.1)
Basophils Relative: 0.6 % (ref 0.0–3.0)
Eosinophils Absolute: 0.1 10*3/uL (ref 0.0–0.7)
Eosinophils Relative: 1 % (ref 0.0–5.0)
HCT: 29.7 % — ABNORMAL LOW (ref 36.0–46.0)
Hemoglobin: 9.4 g/dL — ABNORMAL LOW (ref 12.0–15.0)
Lymphocytes Relative: 11.1 % — ABNORMAL LOW (ref 12.0–46.0)
Lymphs Abs: 1.1 10*3/uL (ref 0.7–4.0)
MCHC: 31.8 g/dL (ref 30.0–36.0)
MCV: 76.9 fl — ABNORMAL LOW (ref 78.0–100.0)
Monocytes Absolute: 0.3 10*3/uL (ref 0.1–1.0)
Monocytes Relative: 3.4 % (ref 3.0–12.0)
Neutro Abs: 8.1 10*3/uL — ABNORMAL HIGH (ref 1.4–7.7)
Neutrophils Relative %: 83.9 % — ABNORMAL HIGH (ref 43.0–77.0)
Platelets: 296 10*3/uL (ref 150.0–400.0)
RBC: 3.86 Mil/uL — ABNORMAL LOW (ref 3.87–5.11)
RDW: 15.1 % — ABNORMAL HIGH (ref 11.5–14.6)
WBC: 9.7 10*3/uL (ref 4.5–10.5)

## 2011-11-23 LAB — CREATININE, SERUM: Creatinine, Ser: 0.8 mg/dL (ref 0.4–1.2)

## 2011-11-23 LAB — LIPID PANEL
Cholesterol: 148 mg/dL (ref 0–200)
HDL: 50.4 mg/dL (ref >39.00)
LDL Cholesterol: 72 mg/dL (ref 0–99)
Total CHOL/HDL Ratio: 3
Triglycerides: 127 mg/dL (ref 0.0–149.0)
VLDL: 25.4 mg/dL (ref 0.0–40.0)

## 2011-11-23 LAB — BUN: BUN: 16 mg/dL (ref 6–23)

## 2011-11-23 NOTE — Progress Notes (Signed)
Addended by: Boone Master E on: 11/23/2011 09:06 AM   Modules accepted: Orders

## 2011-11-23 NOTE — Patient Instructions (Signed)
Lab work today We will call you with results.  Your physician wants you to follow-up in: Jan 2014 You will receive a reminder letter in the mail two months in advance. If you don't receive a letter, please call our office to schedule the follow-up appointment.

## 2011-11-23 NOTE — Progress Notes (Signed)
HPI  Patient is a 78 year old with a history of CAD (s/p IWMI with stent ot RCA. repeat stnet to the RCA for instent restenois. She is also s/p DES to L main. Last cath  In summer 2011 showed: 40% LAD lesion; 50% Lcx; small OM1 with 80% lesion; RCA with 20% instent restenosis  This was stable.  Pt continued on medical Rx. Sge also has a hstory of emphysema, probable BOOP, scleroderma. Seen by Marvis Repress on Tues.  Rx with abx and prednisone for pneumonia. Prior to URI not CP  No CP now.   Has had chronic sinus infections. Allergies  Allergen Reactions  . Metoprolol-Hydrochlorothiazide     REACTION: decreased bp  . Penicillins     REACTION: hives  . Sulfonamide Derivatives     REACTION: hives  . Titanium     Cervical plate - had to change for stainless steel    Current Outpatient Prescriptions  Medication Sig Dispense Refill  . amLODipine (NORVASC) 10 MG tablet Once a day      . aspirin 325 MG tablet Take 325 mg by mouth 2 (two) times daily.       [name] esomeprazole (NEXIUM) 40 MG capsule Take 1 capsule (40 mg total) by mouth daily before breakfast.  30 capsule  6  . guaiFENesin (MUCINEX) 600 MG 12 hr tablet Take 600 mg by mouth 2 (two) times daily.      [name] imipramine (TOFRANIL) 25 MG tablet Take 75 mg by mouth at bedtime.       . insulin glargine (LANTUS) 100 UNIT/ML injection Inject into the skin at bedtime. 8-12 untis       . levofloxacin (LEVAQUIN) 500 MG tablet Take 1 tablet (500 mg total) by mouth daily.  10 tablet  0  . levothyroxine (SYNTHROID, LEVOTHROID) 125 MCG tablet Take 125 mcg by mouth daily.       . mometasone (NASONEX) 50 MCG/ACT nasal spray Place 2 sprays into the nose daily. As needed for congestion.       . nitroGLYCERIN (NITROSTAT) 0.4 MG SL tablet Place 0.4 mg under the tongue every 5 (five) minutes as needed.        . predniSONE (DELTASONE) 5 MG tablet 1 tab daily      . senna (SENOKOT) 8.6 MG tablet Take 1 tablet by mouth daily as needed. As needed for constipation.       . ticlopidine (TICLID) 250 MG tablet Take 1 tablet (250 mg total) by mouth 2 (two) times daily.  60 tablet  5  . triazolam (HALCION) 0.25 MG tablet Take 0.25 mg by mouth at bedtime as needed.          Past Medical History  Diagnosis Date  . Coronary artery disease   . Dressler's syndrome   . Hypertension   . Dyslipidemia   . Diabetes mellitus   . CVA (cerebral infarction) 1991  . Macular degeneration   . Depression   . GERD (gastroesophageal reflux disease)   . Diverticulosis   . IBS (irritable bowel syndrome)   . Zenker's diverticulum   . Emphysema   . Pulmonary hypertension   . Chronic sinusitis   . Fibromyalgia   . Hypothyroidism   . Scleroderma     followed by Dr. Dareen Piano  . Sjogren's disease   . Raynaud's phenomenon   . Cervical disc disease   . Adenomatous colon polyp   . Scleroderma   . COPD (chronic obstructive pulmonary disease)   .  Arthritis   . Blood transfusion   . Myocardial infarction may 27th 2007    Past Surgical History  Procedure Date  . Total abdominal hysterectomy 1973  . Bilateral oophorectomy 2002  . Spinal fusion 1997    and implantation of a plate   . Lobe thyroid removal   . Thyroid lobectomy 1991    removal of left lobe thyroid  . Cholecystectomy 1965  . Appendectomy   . Cataract extraction   . Bronchoscopy 02-22-09  . Stent surgery     x3    Family History  Problem Relation Age of Onset  . Other Mother 49    Died from sepsis  . Stroke Father 59    died  . Heart disease Father   . Cancer Sister     lung  . Cancer Brother     lung    History   Social History  . Marital Status: Divorced    Spouse Name: N/A    Number of Children: N/A  . Years of Education: N/A   Occupational History  . retired    Social History Main Topics  . Smoking status: Former Smoker -- 1.0 packs/day for 40 years    Types: Cigarettes    Quit date: 06/26/1988  . Smokeless tobacco: Not on file   Comment: 40 pack years  . Alcohol Use: No  .  Drug Use: No  . Sexually Active: Not on file   Other Topics Concern  . Not on file   Social History Narrative  . No narrative on file    Review of Systems:  All systems reviewed.  They are negative to the above problem except as previously stated.  Vital Signs: BP 130/63  Pulse 82  Ht 5\' 2"  (1.575 m)  Wt 133 lb (60.328 kg)  BMI 24.33 kg/m2  Physical Exam Patient is in NAD HEENT:  Normocephalic, atraumatic.   Neck: JVP is normal. No thyromegaly. No bruits.  Lungs: clear to auscultation. No rales no wheezes.  Heart: Regular rate and rhythm. Normal S1, S2. No S3.   No significant murmurs. PMI not displaced.  Abdomen:  Supple, nontender. Normal bowel sounds. No masses. No hepatomegaly.  Extremities:   Good distal pulses throughout. No lower extremity edema.  Musculoskeletal :moving all extremities.  Neuro:   alert and oriented x3.  CN II-XII grossly intact.  EKG:  SR   Nonspecific ST T Wave change Assessment and Plan:  1.  CAD.  Patient doing well.  No symptoms to suggest angina.  Keep on same regimen.  Can stop ticlid pre surgery; resume after. From cardiac standpoint I feel she is at low risk for major cardiac event.  OK to proceed  2.  Lipids:  Will check today.  Problem with statins.  Ate 5 hours ago.

## 2011-11-24 ENCOUNTER — Ambulatory Visit (INDEPENDENT_AMBULATORY_CARE_PROVIDER_SITE_OTHER)
Admission: RE | Admit: 2011-11-24 | Discharge: 2011-11-24 | Disposition: A | Discharge status: Home / Self Care | Payer: Medicare Other | Source: Ambulatory Visit | Attending: Adult Health | Admitting: Adult Health

## 2011-11-24 DIAGNOSIS — J438 Other emphysema: Secondary | ICD-10-CM | POA: Diagnosis not present

## 2011-11-24 DIAGNOSIS — J984 Other disorders of lung: Secondary | ICD-10-CM | POA: Diagnosis not present

## 2011-11-24 DIAGNOSIS — J189 Pneumonia, unspecified organism: Secondary | ICD-10-CM

## 2011-11-24 MED ORDER — IOHEXOL 300 MG/ML  SOLN
80.0000 mL | Freq: Once | INTRAMUSCULAR | Status: AC | PRN
  Administered 2011-11-24: 80 mL via INTRAVENOUS

## 2011-12-06 ENCOUNTER — Ambulatory Visit (INDEPENDENT_AMBULATORY_CARE_PROVIDER_SITE_OTHER): Payer: Medicare Other | Admitting: Pulmonary Disease

## 2011-12-06 ENCOUNTER — Encounter: Payer: Self-pay | Admitting: Pulmonary Disease

## 2011-12-06 ENCOUNTER — Telehealth (INDEPENDENT_AMBULATORY_CARE_PROVIDER_SITE_OTHER): Payer: Self-pay

## 2011-12-06 VITALS — BP 128/48 | HR 78 | Temp 97.9°F | Ht 62.0 in | Wt 131.0 lb

## 2011-12-06 DIAGNOSIS — J8489 Other specified interstitial pulmonary diseases: Secondary | ICD-10-CM

## 2011-12-06 DIAGNOSIS — J438 Other emphysema: Secondary | ICD-10-CM | POA: Diagnosis not present

## 2011-12-06 DIAGNOSIS — J8409 Other alveolar and parieto-alveolar conditions: Secondary | ICD-10-CM

## 2011-12-06 DIAGNOSIS — J310 Chronic rhinitis: Secondary | ICD-10-CM

## 2011-12-06 DIAGNOSIS — K56609 Unspecified intestinal obstruction, unspecified as to partial versus complete obstruction: Secondary | ICD-10-CM

## 2011-12-06 DIAGNOSIS — M349 Systemic sclerosis, unspecified: Secondary | ICD-10-CM

## 2011-12-06 DIAGNOSIS — J189 Pneumonia, unspecified organism: Secondary | ICD-10-CM | POA: Diagnosis not present

## 2011-12-06 DIAGNOSIS — K56699 Other intestinal obstruction unspecified as to partial versus complete obstruction: Secondary | ICD-10-CM

## 2011-12-06 NOTE — Assessment & Plan Note (Signed)
Depending on her symptoms status will need to consider repeating PFT at her next follow up.

## 2011-12-06 NOTE — Progress Notes (Signed)
Chief Complaint  Patient presents with  . Follow-up    Patient states better since last visit. Patient states still c/o fatigue, cough with green mucus and  sob. Denies chest pain and chest tightness.     History of Present Illness: [name] is a 78 y.o. 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  She was seen in May by Tammy for breathing trouble, and given levaquin for 10 days, and prednisone was increased to 10 mg daily for 1 week.  She is slowly improving, but still feels run down.  She still has cough with clear to green sputum.  She denies chest pain.  She still has sinus congestion.  She is schedule for partial colon resection surgery later this month with Dr. Michaell Cowing.   Past Medical History  Diagnosis Date  . Coronary artery disease   . Dressler's syndrome   . Hypertension   . Dyslipidemia   . Diabetes mellitus   . CVA (cerebral infarction) 1991  . Macular degeneration   . Depression   . GERD (gastroesophageal reflux disease)   . Diverticulosis   . IBS (irritable bowel syndrome)   . Zenker's diverticulum   . Emphysema   . Pulmonary hypertension   . Chronic sinusitis   . Fibromyalgia   . Hypothyroidism   . Scleroderma     followed by Dr. Dareen Piano  . Sjogren's disease   . Raynaud's phenomenon   . Cervical disc disease   . Adenomatous colon polyp   . Scleroderma   . COPD (chronic obstructive pulmonary disease)   . Arthritis   . Blood transfusion   . Myocardial infarction may 27th 2007    Past Surgical History  Procedure Date  . Total abdominal hysterectomy 1973  . Bilateral oophorectomy 2002  . Spinal fusion 1997    and implantation of a plate   . Lobe thyroid removal   . Thyroid lobectomy 1991    removal of left lobe thyroid  . Cholecystectomy 1965  . Appendectomy   . Cataract extraction   . Bronchoscopy 02-22-09  . Stent surgery     x3    Allergies    Allergen Reactions  . Metoprolol-Hydrochlorothiazide     REACTION: decreased bp  . Penicillins     REACTION: hives  . Sulfonamide Derivatives     REACTION: hives  . Titanium     Cervical plate - had to change for stainless steel    Physical Exam:  Blood pressure 128/48, pulse 78, temperature 97.9 F (36.6 C), temperature source Oral, height 5\' 2"  (1.575 m), weight 131 lb (59.421 kg), SpO2 98.00%. Body mass index is 23.96 kg/(m^2).  Wt Readings from Last 2 Encounters:  12/06/11 131 lb (59.421 kg)  11/23/11 133 lb (60.328 kg)    General - Healthy appearing  HEENT - mild sinus tenderness, clear nasal discharge, no oral exudate, no thrush, stable tongue lesion, no LAN  Cardiac - s1s2 regular, no murmur  Chest - no wheeze/rales  Abd - soft, nontender  Ext - no edema, skin tightening over fingers  Neuro - normal strength  Psych - normal mood/behavior  Ct Chest W Contrast  11/24/2011  *RADIOLOGY REPORT*  Clinical Data: Pneumonia.  Shortness of breath.  Evaluate right upper lobe opacity.  Productive cough.  CT CHEST WITH CONTRAST  Technique:  Multidetector CT imaging of the chest was performed following the standard protocol during bolus administration of intravenous  contrast.  Contrast: 80mL OMNIPAQUE IOHEXOL 300 MG/ML  SOLN  Comparison: Plain films, including 11/21/2011.  Chest CT of 02/02/2009.  Findings: Lung windows demonstrate patent airways, including throughout the right sided endobronchial tree.  Mild centrilobular emphysema.  Ill-defined airspace opacities throughout the right upper lobe. These correspond to the plain film abnormality and are similar in severity but more laterally positioned than on 02/02/2009.  There is also minimal airspace disease in the superior segment right lower lobe.  Soft tissue windows demonstrate surgical changes, likely of left- sided thyroidectomy.  Bovine arch.  Ulcerative plaque within the descending thoracic aorta.  Mild cardiomegaly with  multivessel coronary artery atherosclerosis.  Probable left main or proximal LAD stent. No pericardial or pleural effusion.  Calcified azygo-esophageal recess node. No mediastinal or hilar adenopathy.  Limited abdominal imaging demonstrates tiny hiatal hernia. Normal adrenal glands.  No acute osseous abnormality.  IMPRESSION: 1.  Right upper and less so right lower lobe pulmonary opacities, most consistent with infection.  Favor a typical etiologies such as a mycobacterial infection. 2.  No evidence of centrally obstructing mass or adenopathy. 3.  Mild centrilobular emphysema.  Original Report Authenticated By: Consuello Bossier, M.D.     Assessment/Plan:  Outpatient Encounter Prescriptions as of 12/06/2011  Medication Sig Dispense Refill  . amLODipine (NORVASC) 10 MG tablet Once a day      . aspirin 325 MG tablet Take 325 mg by mouth 2 (two) times daily.       [name] esomeprazole (NEXIUM) 40 MG capsule Take 1 capsule (40 mg total) by mouth daily before breakfast.  30 capsule  6  . guaiFENesin (MUCINEX) 600 MG 12 hr tablet Take 600 mg by mouth 2 (two) times daily.      [name] imipramine (TOFRANIL) 25 MG tablet Take 75 mg by mouth at bedtime.       . insulin glargine (LANTUS) 100 UNIT/ML injection Inject into the skin at bedtime. 8-12 untis       . levothyroxine (SYNTHROID, LEVOTHROID) 125 MCG tablet Take 125 mcg by mouth daily.       . mometasone (NASONEX) 50 MCG/ACT nasal spray Place 2 sprays into the nose daily. As needed for congestion.       . nitroGLYCERIN (NITROSTAT) 0.4 MG SL tablet Place 0.4 mg under the tongue every 5 (five) minutes as needed.        . predniSONE (DELTASONE) 5 MG tablet 1 tab daily      . ticlopidine (TICLID) 250 MG tablet Take 1 tablet (250 mg total) by mouth 2 (two) times daily.  60 tablet  5  . triazolam (HALCION) 0.25 MG tablet Take 0.25 mg by mouth at bedtime as needed.        . senna (SENOKOT) 8.6 MG tablet Take 1 tablet by mouth daily as needed. As needed for constipation.          Jhoel Stieg Pager:  [phone] 12/06/2011, 9:34 AM

## 2011-12-06 NOTE — Telephone Encounter (Signed)
Called pt to let her know that Dr Michaell Cowing and Dr Craige Cotta have been in touch with each other about her pneumonia and they both agree that we need to r/s her surgery from 6/18 to the end of July. The pt has a f/u appt with Dr Craige Cotta on 7/17 and he will recheck the pt then to see if she will be able to have the surgery at the end of the July. I notified our surgery schedulers who will work on r/s the surgery and get in touch with the pt. The pt understands.

## 2011-12-06 NOTE — Assessment & Plan Note (Signed)
She may need f/u with ENT.  She has seen Dr. Jearld Fenton in the past.

## 2011-12-06 NOTE — Assessment & Plan Note (Signed)
She is to continue prednisone 5 mg per day.   

## 2011-12-06 NOTE — Patient Instructions (Signed)
Follow up in 4 weeks with chest xray 

## 2011-12-06 NOTE — Assessment & Plan Note (Signed)
Slowly improving clinically.  Explained that it can take several more weeks until she feels like herself again.  I don't think she needs additional antibiotics.  Will repeat chest xray at next visit.

## 2011-12-06 NOTE — Assessment & Plan Note (Signed)
She is followed by Dr. Anderson with Rheumatology. 

## 2011-12-06 NOTE — Assessment & Plan Note (Signed)
She was scheduled for colon resection later this month with Dr. Michaell Cowing.  I have advised that this be delayed if possible to allow fuller recovery from her pneumonia.  Will re-assess her status in 4 weeks.

## 2011-12-08 ENCOUNTER — Other Ambulatory Visit (HOSPITAL_COMMUNITY): Payer: Medicare Other

## 2011-12-27 ENCOUNTER — Other Ambulatory Visit: Payer: Self-pay | Admitting: Gastroenterology

## 2011-12-27 NOTE — Telephone Encounter (Signed)
Tried to call pt and leave message but mailbox is not set up. Could not leave a message samples will be out front

## 2011-12-29 ENCOUNTER — Other Ambulatory Visit: Payer: Self-pay | Admitting: *Deleted

## 2011-12-29 MED ORDER — ESOMEPRAZOLE MAGNESIUM 40 MG PO CPDR: 40.0000 mg | DELAYED_RELEASE_CAPSULE | Freq: Every day | ORAL | Status: DC

## 2012-01-10 ENCOUNTER — Encounter: Payer: Self-pay | Admitting: Pulmonary Disease

## 2012-01-10 ENCOUNTER — Ambulatory Visit (INDEPENDENT_AMBULATORY_CARE_PROVIDER_SITE_OTHER): Payer: Medicare Other | Admitting: Pulmonary Disease

## 2012-01-10 ENCOUNTER — Ambulatory Visit (INDEPENDENT_AMBULATORY_CARE_PROVIDER_SITE_OTHER)
Admission: RE | Admit: 2012-01-10 | Discharge: 2012-01-10 | Disposition: A | Discharge status: Home / Self Care | Payer: Medicare Other | Source: Ambulatory Visit | Attending: Pulmonary Disease | Admitting: Pulmonary Disease

## 2012-01-10 VITALS — BP 132/50 | HR 85 | Temp 98.3°F | Ht 62.5 in | Wt 132.6 lb

## 2012-01-10 DIAGNOSIS — J189 Pneumonia, unspecified organism: Secondary | ICD-10-CM | POA: Diagnosis not present

## 2012-01-10 DIAGNOSIS — J310 Chronic rhinitis: Secondary | ICD-10-CM | POA: Diagnosis not present

## 2012-01-10 DIAGNOSIS — J40 Bronchitis, not specified as acute or chronic: Secondary | ICD-10-CM | POA: Diagnosis not present

## 2012-01-10 DIAGNOSIS — J438 Other emphysema: Secondary | ICD-10-CM | POA: Diagnosis not present

## 2012-01-10 DIAGNOSIS — J8409 Other alveolar and parieto-alveolar conditions: Secondary | ICD-10-CM

## 2012-01-10 DIAGNOSIS — J8489 Other specified interstitial pulmonary diseases: Secondary | ICD-10-CM

## 2012-01-10 DIAGNOSIS — K56609 Unspecified intestinal obstruction, unspecified as to partial versus complete obstruction: Secondary | ICD-10-CM

## 2012-01-10 DIAGNOSIS — K56699 Other intestinal obstruction unspecified as to partial versus complete obstruction: Secondary | ICD-10-CM

## 2012-01-10 MED ORDER — AMOXICILLIN-POT CLAVULANATE 875-125 MG PO TABS: 1.0000 | ORAL_TABLET | Freq: Two times a day (BID) | ORAL | Status: AC

## 2012-01-10 MED ORDER — DIPHENHYDRAMINE HCL 50 MG PO TABS: ORAL_TABLET | ORAL | Status: DC

## 2012-01-10 MED ORDER — MONTELUKAST SODIUM 10 MG PO TABS: 10.0000 mg | ORAL_TABLET | Freq: Every day | ORAL | Status: DC

## 2012-01-10 NOTE — Assessment & Plan Note (Signed)
Clinically and radiographicaly resolved.

## 2012-01-10 NOTE — Patient Instructions (Signed)
Augmentin one pill twice per day for 7 days>>can refill once if sinus symptoms persist Nasal irrigation twice per day until feeling better, then once per day Nasonex two sprays each nostril daily Benadryl 50 mg daily for one week, then as needed Montelukast (singulair) 10 mg nightly for 2 weeks Call next week to update status Follow up in 2 months

## 2012-01-10 NOTE — Assessment & Plan Note (Signed)
I have asked her to call next week to update her status.  If she is not significantly improved, then she may need to post-pone her colon surgery again.

## 2012-01-10 NOTE — Assessment & Plan Note (Signed)
She has acute sinusitis in setting of chronic rhinitis.  I think most of her current symptoms are related to her sinuses.  Will give course of augmentin.  She is to continue nasonex.  Advised to use nasal irrigation bid, and benadryl 50 mg daily until her symptoms improve.  Will also have her use singulair 10 mg nightly for 2 weeks.  I have asked her to call me next week to update her status, and then determine if she is fit to proceed with surgery on July 25.

## 2012-01-10 NOTE — Progress Notes (Signed)
Chief Complaint  Patient presents with  . Follow-up    w/ CXR. Pt c/o a lot of coughing w/ yellow phlem, wheezing, increase SOB, nasal congestion, PND, sore throat, watery/itchy eyes, left ear pain x 2 weeks.  . surgical clearance    Pt is going to have colon surgery 01/18/12 by Dr. Michaell Cowing over at [facility].    History of Present Illness: [name] is a 78 y.o. 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  She developed worsening sinus symptoms about 2 weeks ago.  She has sinus congestion, sinus pressure, and post-nasal drip.  This is triggering a cough and sputum production.  She thinks she has some wheeze when she coughs.  She has a low grade temperature of 99.3 nightly.  She denies chest pain.  She feels drained.  She is not having abdominal pain.  She has allergy to penicillin listed.  However, she states she had bad reaction once to penicillin, but has take several courses of augmentin since w/o difficulty.  She feels that augmentin has always helped clear up her sinuses.  She is schedule for partial colon resection surgery later this month with Dr. Michaell Cowing.   Past Medical History  Diagnosis Date  . Coronary artery disease   . Dressler's syndrome   . Hypertension   . Dyslipidemia   . Diabetes mellitus   . CVA (cerebral infarction) 1991  . Macular degeneration   . Depression   . GERD (gastroesophageal reflux disease)   . Diverticulosis   . IBS (irritable bowel syndrome)   . Zenker's diverticulum   . Emphysema   . Pulmonary hypertension   . Chronic sinusitis   . Fibromyalgia   . Hypothyroidism   . Scleroderma     followed by Dr. Dareen Piano  . Sjogren's disease   . Raynaud's phenomenon   . Cervical disc disease   . Adenomatous colon polyp   . Scleroderma   . COPD (chronic obstructive pulmonary disease)   . Arthritis   . Blood transfusion   . Myocardial infarction may 27th 2007    Past  Surgical History  Procedure Date  . Total abdominal hysterectomy 1973  . Bilateral oophorectomy 2002  . Spinal fusion 1997    and implantation of a plate   . Lobe thyroid removal   . Thyroid lobectomy 1991    removal of left lobe thyroid  . Cholecystectomy 1965  . Appendectomy   . Cataract extraction   . Bronchoscopy 02-22-09  . Stent surgery     x3    Allergies  Allergen Reactions  . Metoprolol-Hydrochlorothiazide     REACTION: decreased bp  . Sulfonamide Derivatives     REACTION: hives  . Titanium     Cervical plate - had to change for stainless steel    Physical Exam:  Blood pressure 132/50, pulse 85, temperature 98.3 F (36.8 C), temperature source Oral, height 5' 2.5" (1.588 m), weight 132 lb 9.6 oz (60.147 kg), SpO2 99.00%. Body mass index is 23.87 kg/(m^2).  Wt Readings from Last 2 Encounters:  01/10/12 132 lb 9.6 oz (60.147 kg)  12/06/11 131 lb (59.421 kg)    General - Healthy appearing  HEENT - mild sinus tenderness, clear nasal discharge, no oral exudate, no thrush, stable tongue lesion, no LAN  Cardiac - s1s2 regular, no murmur  Chest - no wheeze/rales  Abd - soft, nontender  Ext - no edema, skin tightening over  fingers  Neuro - normal strength  Psych - normal mood/behavior  Dg Chest 2 View  01/10/2012  *RADIOLOGY REPORT*  Clinical Data: Cough, shortness of breath, chest pain, wheezing, COPD, diabetes, former smoker, BOOP  CHEST - 2 VIEW  Comparison: 11/21/2011  Findings: Upper normal heart size. Mediastinal contours and pulmonary vascularity normal. Emphysematous and minimal bronchitic changes. Resolution of previously identified right upper lobe infiltrate. Lungs clear. No pleural effusion or pneumothorax. Endplate spur formation thoracic spine. Surgical clips in left cervical region, by CT prior left thyroid lobectomy. Prior cervical spine fusion. Atherosclerotic calcification aortic arch.  IMPRESSION: Emphysematous and bronchitic changes compatible with  COPD. Resolution of previously identified right upper lobe infiltrate.  Original Report Authenticated By: Lollie Marrow, M.D.    Assessment/Plan:  Outpatient Encounter Prescriptions as of 01/10/2012  Medication Sig Dispense Refill  . amLODipine (NORVASC) 10 MG tablet Once a day      . aspirin 325 MG tablet Take 325 mg by mouth 2 (two) times daily.       [name] esomeprazole (NEXIUM) 40 MG capsule Take 1 capsule (40 mg total) by mouth daily before breakfast.  30 capsule  6  . guaiFENesin (MUCINEX) 600 MG 12 hr tablet Take 600 mg by mouth daily.       [name] imipramine (TOFRANIL) 25 MG tablet Take 75 mg by mouth at bedtime.       . insulin glargine (LANTUS) 100 UNIT/ML injection Inject into the skin at bedtime. 8-12 untis       . levothyroxine (SYNTHROID, LEVOTHROID) 125 MCG tablet Take 125 mcg by mouth daily.       . mometasone (NASONEX) 50 MCG/ACT nasal spray Place 2 sprays into the nose daily. As needed for congestion.       . nitroGLYCERIN (NITROSTAT) 0.4 MG SL tablet Place 0.4 mg under the tongue every 5 (five) minutes as needed.        . predniSONE (DELTASONE) 5 MG tablet 1 tab daily      . senna (SENOKOT) 8.6 MG tablet Take 1 tablet by mouth daily as needed. As needed for constipation.      . ticlopidine (TICLID) 250 MG tablet Take 1 tablet (250 mg total) by mouth 2 (two) times daily.  60 tablet  5  . triazolam (HALCION) 0.25 MG tablet Take 0.25 mg by mouth at bedtime as needed.        [name] amoxicillin-clavulanate (AUGMENTIN) 875-125 MG per tablet Take 1 tablet by mouth 2 (two) times daily.  14 tablet  1  . diphenhydrAMINE (BENADRYL) 50 MG tablet One pill daily for one week, then as needed for allergies  30 tablet  0  . montelukast (SINGULAIR) 10 MG tablet Take 1 tablet (10 mg total) by mouth at bedtime.  14 tablet  0    [name] Pager:  [phone] 01/10/2012, 10:51 AM

## 2012-01-10 NOTE — Assessment & Plan Note (Signed)
I don't think her current symptoms are related to obstructive lung disease.  Will defer inhaler therapy at present since she never had favorable response to previous trials of inhalers.

## 2012-01-10 NOTE — Assessment & Plan Note (Signed)
She is to continue prednisone 5 mg per day.

## 2012-01-15 ENCOUNTER — Telehealth: Payer: Self-pay | Admitting: Pulmonary Disease

## 2012-01-15 DIAGNOSIS — I10 Essential (primary) hypertension: Secondary | ICD-10-CM | POA: Diagnosis not present

## 2012-01-15 DIAGNOSIS — J0100 Acute maxillary sinusitis, unspecified: Secondary | ICD-10-CM | POA: Diagnosis not present

## 2012-01-15 DIAGNOSIS — E039 Hypothyroidism, unspecified: Secondary | ICD-10-CM | POA: Diagnosis not present

## 2012-01-15 NOTE — Telephone Encounter (Signed)
Pt called back again. She states she needs to know something today about her being able to have surgery Thurs.  Call her back @ [phone] Leanora Ivanoff

## 2012-01-15 NOTE — Telephone Encounter (Signed)
Discussed status with patient.  She did not start Abx until 07/19.  She does not think that she would be able to have surgery as schedule with Dr. Michaell Cowing.  I will contact Dr. Michaell Cowing to inform that surgery will need to be postponed again.

## 2012-01-15 NOTE — Telephone Encounter (Signed)
Pt called back again. Says VS prescribed augmentin for her cough / congestion last week. She says she does not feel she is "any better". Still coughing  / sinus infection. Still taking abx. Surgery is scheduled for this Thursday. Hazel Sams

## 2012-01-15 NOTE — Telephone Encounter (Signed)
I spoke with pt and she states the Augmentin did not help. She does not feel any better. She is still coughing up yellow phlem, blowing out yellow phlem, wheezing, increase SOB, nasal congestion, PND. Her surgery is scheduled for Thursday and is wanting to know what should she do. She was not able to start her abx until Friday due to her pharmacy did not deliver it until then. Please advise Dr. Craige Cotta, thanks  Allergies  Allergen Reactions  . Metoprolol-Hydrochlorothiazide     REACTION: decreased bp  . Sulfonamide Derivatives     REACTION: hives  . Titanium     Cervical plate - had to change for stainless steel

## 2012-01-16 ENCOUNTER — Telehealth (INDEPENDENT_AMBULATORY_CARE_PROVIDER_SITE_OTHER): Payer: Self-pay

## 2012-01-16 NOTE — Telephone Encounter (Signed)
Called pt to let her know that we are r/s her surgery from 7/25 due to the recurrent pneumonia and sinus infection per Dr Michaell Cowing. The pt needs to have her surgery r/s for a month out per Dr Michaell Cowing. I will let the schedulers know so they can work on this with the pt.

## 2012-01-29 ENCOUNTER — Telehealth: Payer: Self-pay | Admitting: Pulmonary Disease

## 2012-01-29 NOTE — Telephone Encounter (Signed)
I spoke with pt and is aware of VS recs. She voiced her understanding and will call back in 3 days with update. Nothing further was needed

## 2012-01-29 NOTE — Telephone Encounter (Signed)
Called, spoke with pt who was seen by Dr. Craige Cotta on July 17.  States she took the augmentin rx and filled the refill which she finished 2 days ago.  States she still has a prod cough with green mucus.  Also, reports wheezing, no energy, "doesn't feel good," feels warm in the evenings, and has pain around eyes.  Unsure if has a temp as she is legally blind and talking thermometer has broken.  Denies increased SOB, chest tightness, or chest pain.  Requesting recs from this standpoint as she will need to have surgery in the future.  As there are no openings in the office within the next few days, will send msg to Dr. Craige Cotta and page him.  Pls advise.  Thank you.

## 2012-01-29 NOTE — Telephone Encounter (Signed)
Attempted to call pt, but no answer.  Please have her increase her prednisone to 20 mg daily for 3 days.  Have her call back in 3 days to update status, and will then discuss plan for prednisone taper.

## 2012-02-05 ENCOUNTER — Telehealth: Payer: Self-pay | Admitting: Pulmonary Disease

## 2012-02-05 NOTE — Telephone Encounter (Addendum)
Per phone msg from 01/29/12:  Renee Helling, MD 01/29/2012 4:57 PM Signed  Attempted to call pt, but no answer.  Please have her increase her prednisone to 20 mg daily for 3 days. Have her call back in 3 days to update status, and will then discuss plan for prednisone taper  -----  Called, spoke with pt who states she has been taking prednisone 20 mg qd x 4 days and symptoms are unchanged.  Reports she is still coughing a lot (prod with green mucus), has PND, "bad" HA, and increased SOB on some days.  Feels like she has a low grade temp in the evenings but has been unable to check it.  Denies wheezing, chest tightness, or chest pain.  Requesting further recs.  Dr. Craige Cotta, pls advise.  Thank you.   Note: phone msg was closed by accident.  Will route to Dr. Craige Cotta.

## 2012-02-06 ENCOUNTER — Telehealth: Payer: Self-pay | Admitting: Pulmonary Disease

## 2012-02-06 MED ORDER — PREDNISONE 5 MG PO TABS: 5.0000 mg | ORAL_TABLET | Freq: Every day | ORAL | Status: AC

## 2012-02-06 MED ORDER — AMOXICILLIN-POT CLAVULANATE 875-125 MG PO TABS: 1.0000 | ORAL_TABLET | Freq: Two times a day (BID) | ORAL | Status: AC

## 2012-02-06 NOTE — Telephone Encounter (Signed)
Per phone from 02/05/12: [employer], spoke with pt who states she has been taking prednisone 20 mg qd x 4 days and symptoms are unchanged. Reports she is still coughing a lot (prod with green mucus), has PND, "bad" HA, and increased SOB on some days. Feels like she has a low grade temp in the evenings but has been unable to check it. Denies wheezing, chest tightness, or chest pain. Requesting further recs. Dr. Craige Cotta, pls advise. Thank you.  Note: phone msg was closed by accident yesterday.

## 2012-02-06 NOTE — Telephone Encounter (Signed)
She continues to have sinus congestion, post-nasal drip and cough with green sputum.  She has low grade temperature.  She has not noticed any benefit from prednisone.  She felt better while on antibiotics, but then her symptoms get worse when she is off antibiotics.  She was seen by Dr. Jearld Fenton with ENT a year ago, and was told that she did not need surgical intervention for recurrent sinus infections.  She has allergy to sulfa and she thinks that she may be having a reaction to lantus, and she is concerned this could be contributing to her sinus problems.  I will give her additional two week course of augmentin.  Will have her resume her usual dose of prednisone 5 mg daily.  Advised her to d/w Dr. Chestine Spore about adjusting her diabetes regimen.

## 2012-02-20 DIAGNOSIS — E039 Hypothyroidism, unspecified: Secondary | ICD-10-CM | POA: Diagnosis not present

## 2012-02-20 DIAGNOSIS — I10 Essential (primary) hypertension: Secondary | ICD-10-CM | POA: Diagnosis not present

## 2012-03-06 NOTE — Progress Notes (Signed)
Dr Michaell Cowing- please add pre op orders- she has APPT at PST 03/13/12  Thanks-   shes seeing Dr Craige Cotta 03/12/12

## 2012-03-11 ENCOUNTER — Other Ambulatory Visit (INDEPENDENT_AMBULATORY_CARE_PROVIDER_SITE_OTHER): Payer: Self-pay | Admitting: Surgery

## 2012-03-12 ENCOUNTER — Ambulatory Visit: Payer: Medicare Other | Admitting: Pulmonary Disease

## 2012-03-13 ENCOUNTER — Encounter (HOSPITAL_COMMUNITY)
Admission: RE | Admit: 2012-03-13 | Discharge: 2012-03-13 | Disposition: A | Discharge status: Home / Self Care | Payer: Medicare Other | Source: Ambulatory Visit | Attending: Surgery | Admitting: Surgery

## 2012-03-13 ENCOUNTER — Encounter (HOSPITAL_COMMUNITY): Payer: Self-pay

## 2012-03-13 ENCOUNTER — Telehealth: Payer: Self-pay | Admitting: Pulmonary Disease

## 2012-03-13 ENCOUNTER — Encounter (HOSPITAL_COMMUNITY): Payer: Self-pay | Admitting: Pharmacy Technician

## 2012-03-13 ENCOUNTER — Ambulatory Visit (INDEPENDENT_AMBULATORY_CARE_PROVIDER_SITE_OTHER)
Admission: RE | Admit: 2012-03-13 | Discharge: 2012-03-13 | Disposition: A | Discharge status: Home / Self Care | Payer: Medicare Other | Source: Ambulatory Visit | Attending: Pulmonary Disease | Admitting: Pulmonary Disease

## 2012-03-13 DIAGNOSIS — J449 Chronic obstructive pulmonary disease, unspecified: Secondary | ICD-10-CM | POA: Diagnosis not present

## 2012-03-13 DIAGNOSIS — J438 Other emphysema: Secondary | ICD-10-CM

## 2012-03-13 DIAGNOSIS — J189 Pneumonia, unspecified organism: Secondary | ICD-10-CM | POA: Diagnosis not present

## 2012-03-13 DIAGNOSIS — J8409 Other alveolar and parieto-alveolar conditions: Secondary | ICD-10-CM

## 2012-03-13 DIAGNOSIS — J8489 Other specified interstitial pulmonary diseases: Secondary | ICD-10-CM

## 2012-03-13 HISTORY — DX: Malignant (primary) neoplasm, unspecified: C80.1

## 2012-03-13 HISTORY — DX: Pneumonia, unspecified organism: J18.9

## 2012-03-13 HISTORY — DX: Cerebral infarction, unspecified: I63.9

## 2012-03-13 HISTORY — DX: Sleep apnea, unspecified: G47.30

## 2012-03-13 HISTORY — DX: Legal blindness, as defined in USA: H54.8

## 2012-03-13 LAB — BASIC METABOLIC PANEL
BUN: 15 mg/dL (ref 6–23)
CO2: 28 mEq/L (ref 19–32)
Calcium: 9 mg/dL (ref 8.4–10.5)
Chloride: 99 mEq/L (ref 96–112)
Creatinine, Ser: 0.71 mg/dL (ref 0.50–1.10)
GFR calc Af Amer: >90 mL/min (ref >90)
GFR calc non Af Amer: 80 mL/min — ABNORMAL LOW (ref >90)
Glucose, Bld: 109 mg/dL — ABNORMAL HIGH (ref 70–99)
Potassium: 3.5 mEq/L (ref 3.5–5.1)
Sodium: 137 mEq/L (ref 135–145)

## 2012-03-13 LAB — SURGICAL PCR SCREEN
MRSA, PCR: NEGATIVE
Staphylococcus aureus: NEGATIVE

## 2012-03-13 LAB — CBC
HCT: 31.4 % — ABNORMAL LOW (ref 36.0–46.0)
Hemoglobin: 9.9 g/dL — ABNORMAL LOW (ref 12.0–15.0)
MCH: 24 pg — ABNORMAL LOW (ref 26.0–34.0)
MCHC: 31.5 g/dL (ref 30.0–36.0)
MCV: 76.2 fL — ABNORMAL LOW (ref 78.0–100.0)
Platelets: 194 10*3/uL (ref 150–400)
RBC: 4.12 MIL/uL (ref 3.87–5.11)
RDW: 15.4 % (ref 11.5–15.5)
WBC: 5 10*3/uL (ref 4.0–10.5)

## 2012-03-13 LAB — ABO/RH: ABO/RH(D): B POS

## 2012-03-13 MED ORDER — CHLORHEXIDINE GLUCONATE 4 % EX LIQD
1.0000 "application " | Freq: Once | CUTANEOUS | Status: DC
  Filled 2012-03-13: qty 15

## 2012-03-13 NOTE — Telephone Encounter (Signed)
PT had to cancel appt with Dr Craige Cotta yesterday for pre op clearance with cxr due to conflict.  Pt is due to have colon surgery on 03-18-12 .  Please advise on this.  Dr Craige Cotta schedule is full on 03-14-12 and he is not in office on 03-15-12.  Ok to leave message with pt's daughter at [phone] if we cannot get in touch with pt.

## 2012-03-13 NOTE — Telephone Encounter (Signed)
Pt returned call.  Please call her back [name]

## 2012-03-13 NOTE — Patient Instructions (Addendum)
20 [name]  03/13/2012   Your procedure is scheduled on: 03/18/12 at 7:30 am  Report to SHORT STAY DEPT  At  5:15 AM.  Call this number if you have problems the morning of surgery: [phone]   Remember:   Do not eat food or drink liquids AFTER MIDNIGHT    Take these medicines the morning of surgery with A SIP OF WATER: NEXIUM / AMLODIPINE / SYNTHROID    Do not wear jewelry, make-up or nail polish.  Do not wear lotions, powders, or perfumes.   Do not shave legs or underarms 48 hrs. before surgery (men may shave face)  Do not bring valuables to the hospital.  Contacts, dentures or bridgework may not be worn into surgery.  Leave suitcase in the car. After surgery it may be brought to your room.  For patients admitted to the hospital, checkout time is 11:00 AM the day of discharge.   Patients discharged the day of surgery will not be allowed to drive home. If going home same day of surgery, must have someone stay with you first 24 hrs at home and arrange for some one to drive you home from hospital.    Special Instructions:   Please read over the following fact sheets that you were given: MRSA  Information               SHOWER WITH BETASEPT THE NIGHT BEFORE SURGERY AND THE MORNING OF SURGERY  STOP ASPIRIN AND HERBAL PRODUCTS 5 DAYS PRE OP                  FOLLOW BOWEL PREP          X_______________________________________________________________

## 2012-03-13 NOTE — Telephone Encounter (Signed)
Can we double book her for appointment tomorrow.  She can have chest xray today, or tomorrow if she is coming for ROV.  Would prefer to have ROV and CXR on 9/19.

## 2012-03-13 NOTE — Telephone Encounter (Signed)
I spoke with pt and is scheduled to come in and see VS tomorrow at 1:30. Pt stated she will come by this afternoon to have her cxr done. Nothing further was needed

## 2012-03-13 NOTE — Telephone Encounter (Signed)
ATC- NA- Unable to leave message- Will try back later

## 2012-03-13 NOTE — Progress Notes (Signed)
03/13/12 1634  OBSTRUCTIVE SLEEP APNEA  Have you ever been diagnosed with sleep apnea through a sleep study? No  Do you snore loudly (loud enough to be heard through closed doors)?  1  Do you often feel tired, fatigued, or sleepy during the daytime? 1  Has anyone observed you stop breathing during your sleep? 1  Do you have, or are you being treated for high blood pressure? 1  BMI more than 35 kg/m2? 0  Age over 75 years old? 1  Neck circumference greater than 40 cm/18 inches? 0  Gender: 0  Obstructive Sleep Apnea Score 5   Score 4 or greater  Updated health history;Results sent to PCP

## 2012-03-14 ENCOUNTER — Encounter: Payer: Self-pay | Admitting: Pulmonary Disease

## 2012-03-14 ENCOUNTER — Telehealth (INDEPENDENT_AMBULATORY_CARE_PROVIDER_SITE_OTHER): Payer: Self-pay

## 2012-03-14 ENCOUNTER — Telehealth: Payer: Self-pay | Admitting: Internal Medicine

## 2012-03-14 ENCOUNTER — Ambulatory Visit (INDEPENDENT_AMBULATORY_CARE_PROVIDER_SITE_OTHER): Payer: Medicare Other | Admitting: Pulmonary Disease

## 2012-03-14 ENCOUNTER — Telehealth: Payer: Self-pay | Admitting: Pulmonary Disease

## 2012-03-14 VITALS — BP 128/54 | HR 86 | Temp 98.5°F | Ht 62.0 in | Wt 133.2 lb

## 2012-03-14 DIAGNOSIS — J310 Chronic rhinitis: Secondary | ICD-10-CM | POA: Diagnosis not present

## 2012-03-14 DIAGNOSIS — J438 Other emphysema: Secondary | ICD-10-CM

## 2012-03-14 DIAGNOSIS — K56699 Other intestinal obstruction unspecified as to partial versus complete obstruction: Secondary | ICD-10-CM

## 2012-03-14 DIAGNOSIS — K56609 Unspecified intestinal obstruction, unspecified as to partial versus complete obstruction: Secondary | ICD-10-CM | POA: Diagnosis not present

## 2012-03-14 DIAGNOSIS — E039 Hypothyroidism, unspecified: Secondary | ICD-10-CM | POA: Diagnosis not present

## 2012-03-14 DIAGNOSIS — I10 Essential (primary) hypertension: Secondary | ICD-10-CM | POA: Diagnosis not present

## 2012-03-14 DIAGNOSIS — J8409 Other alveolar and parieto-alveolar conditions: Secondary | ICD-10-CM

## 2012-03-14 DIAGNOSIS — J8489 Other specified interstitial pulmonary diseases: Secondary | ICD-10-CM

## 2012-03-14 NOTE — Telephone Encounter (Signed)
Spoke with pt's grandson and advised he tell his grandmother she may d/c ticlid today for surgery on 9/23--i also called alisha at [facility] that pt may d/c ticlid today--i was not comfortable that ms Guthmiller would get info from grandson, so when i spoke to [place] she stated she had to call pt and would give her the information about d/cing ticlid

## 2012-03-14 NOTE — Telephone Encounter (Signed)
Dg Chest 2 View  03/13/2012  *RADIOLOGY REPORT*  Clinical Data: Cough.  Follow-up for pneumonia.  CHEST - 2 VIEW  Comparison: Chest x-ray 01/10/2012.  Findings: Lungs appear hyperexpanded with flattening of the hemidiaphragms, increased retrosternal air space and pruning of the pulmonary vasculature in the periphery, suggestive of underlying COPD.  No acute consolidative airspace disease.  No definite pleural effusions.  No evidence of pulmonary edema.  Heart size is normal.  Mediastinal contours are unremarkable.  Atherosclerosis in the thoracic aorta.  Surgical clips in the lower left cervical region, likely from prior thyroid surgery.  IMPRESSION: 1.  Chronic changes of COPD redemonstrated, without evidence of acute cardiopulmonary disease. 2.  Atherosclerosis   Original Report Authenticated By: Florencia Reasons, M.D.     Left message for pt explaining that chest xray looked good.  No evidence for recurrence of BOOP.  Advised that she could proceed with surgery unless she continues to have sinus difficulties.  Advised her to call back if she had questions.

## 2012-03-14 NOTE — Telephone Encounter (Signed)
LMOM stating that pt can stop her Ticlid 250mg  today and stay off of it till after surgery then she can restart the rx.

## 2012-03-14 NOTE — Telephone Encounter (Signed)
Harriett Sine calling to let us know that Dr Antoine Poche advised pt ok to stop Ticlid today and the pt restart the Rx after sx. I will notify the pt.

## 2012-03-14 NOTE — Assessment & Plan Note (Signed)
She is doing well off prednisone.

## 2012-03-14 NOTE — Assessment & Plan Note (Signed)
Her sinus and respiratory status have improved.  She can proceed with surgery next week with Dr. Michaell Cowing.

## 2012-03-14 NOTE — Assessment & Plan Note (Signed)
She has never had beneficial response to inhaler therapy.

## 2012-03-14 NOTE — Patient Instructions (Signed)
Follow up in 3 months

## 2012-03-14 NOTE — Telephone Encounter (Signed)
LM w/the triage nurse for pt to stop her Ticlid 250mg  today before her colectomy surgery on 03/18/12 by Dr Michaell Cowing. Dr Tenny Craw is not in the office today but they are going to check with another physician and get back in touch with me.

## 2012-03-14 NOTE — Telephone Encounter (Signed)
OK to stop ticlid before surgery

## 2012-03-14 NOTE — Telephone Encounter (Signed)
Will forward to Dr. Tenny Craw for review. If no response in EPIC, will page MD.

## 2012-03-14 NOTE — Assessment & Plan Note (Signed)
Improved

## 2012-03-14 NOTE — Telephone Encounter (Signed)
Called to check on the status of pt stopping Ticlid 250mg  for 5 days before her sx next wk. They still don't have an answer they will get back in touch with me.

## 2012-03-14 NOTE — Assessment & Plan Note (Signed)
She reports improvement in her allergies and sinuses after she stopping using lantus.  She thinks she had an allergy to lantus.

## 2012-03-14 NOTE — Progress Notes (Signed)
Chief Complaint  Patient presents with  . Follow-up    Pt c/o prod cough with yellow mucus, sob and chest tightness.    History of Present Illness: [name] is a 78 y.o. 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  She has been doing better since she stopped taking lantus.    She still has sinus congestion, post-nasal drip, and cough.  This is better.  She does not have as much sinus discomfort, and denies fever.  She stopped taking prednisone about 2 weeks, ago and has been doing okay with her breathing.  She is schedule for partial colon resection surgery later this month with Dr. Michaell Cowing.   Past Medical History  Diagnosis Date  . Coronary artery disease   . Dressler's syndrome   . Hypertension   . Dyslipidemia   . Diabetes mellitus   . CVA (cerebral infarction) 1991  . Macular degeneration   . GERD (gastroesophageal reflux disease)   . Diverticulosis   . IBS (irritable bowel syndrome)   . Zenker's diverticulum   . Emphysema   . Pulmonary hypertension   . Chronic sinusitis   . Fibromyalgia   . Hypothyroidism   . Sjogren's disease   . Raynaud's phenomenon   . Cervical disc disease   . Adenomatous colon polyp   . Scleroderma   . COPD (chronic obstructive pulmonary disease)   . Arthritis   . Blood transfusion   . Myocardial infarction may 27th 2007  . Pneumonia      HX PNEUMONIA  . Depression   . 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 Cancer     cervical cancer pre hysterectomy  . Sleep apnea     STOP BANG SCORE 5  . Legally blind     Past Surgical History  Procedure Date  . Total abdominal hysterectomy 1973  . Bilateral oophorectomy 2002  . Spinal fusion 1997    and implantation of a plate   . Lobe thyroid removal   . Thyroid lobectomy 1991    removal of left lobe thyroid  . Cholecystectomy 1965  . Appendectomy   . Cataract extraction   .  Bronchoscopy 02-22-09  . Stented cardiac  artery 2007 / 2007 / 2010    X 3 STENT    Allergies  Allergen Reactions  . Lantus (Insulin Glargine)   . Metoprolol-Hydrochlorothiazide     REACTION: decreased bp  . Sulfonamide Derivatives Hives  . Titanium     Cervical plate - had to change for stainless steel  . Adhesive (Tape) Hives and Rash    Physical Exam:  Blood pressure 128/54, pulse 86, temperature 98.5 F (36.9 C), temperature source Oral, height 5\' 2"  (1.575 m), weight 133 lb 3.2 oz (60.419 kg), SpO2 96.00%. Body mass index is 24.36 kg/(m^2).  Wt Readings from Last 2 Encounters:  03/14/12 133 lb 3.2 oz (60.419 kg)  03/13/12 135 lb (61.236 kg)    General - Healthy appearing  HEENT - mild sinus tenderness, clear nasal discharge, no oral exudate, no thrush, stable tongue lesion, no LAN  Cardiac - s1s2 regular, no murmur  Chest - no wheeze/rales  Abd - soft, nontender  Ext - no edema, skin tightening over fingers  Neuro - normal strength  Psych - normal mood/behavior  Dg Chest 2 View  03/13/2012  *RADIOLOGY REPORT*  Clinical Data: Cough.  Follow-up for pneumonia.  CHEST - 2 VIEW  Comparison: Chest x-ray 01/10/2012.  Findings: Lungs appear hyperexpanded with flattening of the hemidiaphragms, increased retrosternal air space and pruning of the pulmonary vasculature in the periphery, suggestive of underlying COPD.  No acute consolidative airspace disease.  No definite pleural effusions.  No evidence of pulmonary edema.  Heart size is normal.  Mediastinal contours are unremarkable.  Atherosclerosis in the thoracic aorta.  Surgical clips in the lower left cervical region, likely from prior thyroid surgery.  IMPRESSION: 1.  Chronic changes of COPD redemonstrated, without evidence of acute cardiopulmonary disease. 2.  Atherosclerosis   Original Report Authenticated By: Florencia Reasons, M.D.     Assessment/Plan:  Outpatient Encounter Prescriptions as of 03/14/2012  Medication Sig  Dispense Refill  . amLODipine (NORVASC) 10 MG tablet Take 10 mg by mouth every morning. Once a day      . aspirin 325 MG tablet Take 325 mg by mouth 2 (two) times daily.       . diphenhydrAMINE (BENADRYL) 50 MG tablet One pill daily for one week, then as needed for allergies  30 tablet  0  . esomeprazole (NEXIUM) 40 MG capsule Take 1 capsule (40 mg total) by mouth daily before breakfast.  30 capsule  6  . guaiFENesin (MUCINEX) 600 MG 12 hr tablet Take 600 mg by mouth daily.       [name] imipramine (TOFRANIL) 25 MG tablet Take 75 mg by mouth at bedtime.       . insulin lispro (HUMALOG) 100 UNIT/ML injection Inject 5 Units into the skin 2 (two) times daily. DO NOT SUBSTITUTE PER PATIENT REQUEST      . levothyroxine (SYNTHROID, LEVOTHROID) 125 MCG tablet Take 125 mcg by mouth every morning.       . lubiprostone (AMITIZA) 24 MCG capsule Take 24 mcg by mouth every morning.      . mometasone (NASONEX) 50 MCG/ACT nasal spray Place 2 sprays into the nose daily. As needed for congestion.      . nitroGLYCERIN (NITROSTAT) 0.4 MG SL tablet Place 0.4 mg under the tongue every 5 (five) minutes as needed. For chest      . ticlopidine (TICLID) 250 MG tablet Take 1 tablet (250 mg total) by mouth 2 (two) times daily.  60 tablet  5  . triazolam (HALCION) 0.25 MG tablet Take 0.25 mg by mouth at bedtime as needed. For sleep       Facility-Administered Encounter Medications as of 03/14/2012  Medication Dose Route Frequency Provider Last Rate Last Dose  . DISCONTD: chlorhexidine (HIBICLENS) 4 % liquid 1 application  1 application Topical Once [name] Sportsman, MD      . DISCONTD: chlorhexidine (HIBICLENS) 4 % liquid 1 application  1 application Topical Once [name] Sportsman, MD        Nea Gittens Pager:  [phone] 03/14/2012, 1:54 PM

## 2012-03-14 NOTE — Telephone Encounter (Signed)
Pt  having surgery next Tuesday partial colectomy 9-23, needs to stop ticlid today, pt just told them she was taking it, needs call back asap

## 2012-03-15 NOTE — Progress Notes (Signed)
Abnormal CBC routed to Dr. Michaell Cowing for review

## 2012-03-17 ENCOUNTER — Encounter (HOSPITAL_COMMUNITY): Payer: Self-pay | Admitting: *Deleted

## 2012-03-17 ENCOUNTER — Emergency Department (HOSPITAL_COMMUNITY): Payer: Medicare Other

## 2012-03-17 ENCOUNTER — Inpatient Hospital Stay (HOSPITAL_COMMUNITY)
Admission: EM | Admit: 2012-03-17 | Discharge: 2012-03-22 | DRG: 329 | Disposition: A | Discharge status: Skilled Nursing Facility | Payer: Medicare Other | Attending: Surgery | Admitting: Surgery

## 2012-03-17 DIAGNOSIS — Z8541 Personal history of malignant neoplasm of cervix uteri: Secondary | ICD-10-CM

## 2012-03-17 DIAGNOSIS — J9819 Other pulmonary collapse: Secondary | ICD-10-CM | POA: Diagnosis not present

## 2012-03-17 DIAGNOSIS — E876 Hypokalemia: Secondary | ICD-10-CM | POA: Diagnosis not present

## 2012-03-17 DIAGNOSIS — K565 Intestinal adhesions [bands], unspecified as to partial versus complete obstruction: Principal | ICD-10-CM | POA: Diagnosis present

## 2012-03-17 DIAGNOSIS — Z8673 Personal history of transient ischemic attack (TIA), and cerebral infarction without residual deficits: Secondary | ICD-10-CM

## 2012-03-17 DIAGNOSIS — Z888 Allergy status to other drugs, medicaments and biological substances status: Secondary | ICD-10-CM

## 2012-03-17 DIAGNOSIS — M3500 Sicca syndrome, unspecified: Secondary | ICD-10-CM | POA: Diagnosis present

## 2012-03-17 DIAGNOSIS — I2789 Other specified pulmonary heart diseases: Secondary | ICD-10-CM | POA: Diagnosis present

## 2012-03-17 DIAGNOSIS — H3530 Unspecified macular degeneration: Secondary | ICD-10-CM | POA: Diagnosis present

## 2012-03-17 DIAGNOSIS — I2510 Atherosclerotic heart disease of native coronary artery without angina pectoris: Secondary | ICD-10-CM | POA: Diagnosis not present

## 2012-03-17 DIAGNOSIS — J8409 Other alveolar and parieto-alveolar conditions: Secondary | ICD-10-CM | POA: Diagnosis not present

## 2012-03-17 DIAGNOSIS — C187 Malignant neoplasm of sigmoid colon: Secondary | ICD-10-CM | POA: Diagnosis not present

## 2012-03-17 DIAGNOSIS — K660 Peritoneal adhesions (postprocedural) (postinfection): Secondary | ICD-10-CM | POA: Diagnosis not present

## 2012-03-17 DIAGNOSIS — K219 Gastro-esophageal reflux disease without esophagitis: Secondary | ICD-10-CM | POA: Diagnosis present

## 2012-03-17 DIAGNOSIS — E7800 Pure hypercholesterolemia, unspecified: Secondary | ICD-10-CM | POA: Diagnosis not present

## 2012-03-17 DIAGNOSIS — J9600 Acute respiratory failure, unspecified whether with hypoxia or hypercapnia: Secondary | ICD-10-CM | POA: Diagnosis not present

## 2012-03-17 DIAGNOSIS — F329 Major depressive disorder, single episode, unspecified: Secondary | ICD-10-CM | POA: Diagnosis present

## 2012-03-17 DIAGNOSIS — Z01812 Encounter for preprocedural laboratory examination: Secondary | ICD-10-CM

## 2012-03-17 DIAGNOSIS — D62 Acute posthemorrhagic anemia: Secondary | ICD-10-CM

## 2012-03-17 DIAGNOSIS — Z794 Long term (current) use of insulin: Secondary | ICD-10-CM

## 2012-03-17 DIAGNOSIS — K56699 Other intestinal obstruction unspecified as to partial versus complete obstruction: Secondary | ICD-10-CM | POA: Diagnosis present

## 2012-03-17 DIAGNOSIS — J8489 Other specified interstitial pulmonary diseases: Secondary | ICD-10-CM | POA: Diagnosis present

## 2012-03-17 DIAGNOSIS — R109 Unspecified abdominal pain: Secondary | ICD-10-CM | POA: Diagnosis not present

## 2012-03-17 DIAGNOSIS — Z90710 Acquired absence of both cervix and uterus: Secondary | ICD-10-CM

## 2012-03-17 DIAGNOSIS — E039 Hypothyroidism, unspecified: Secondary | ICD-10-CM | POA: Diagnosis present

## 2012-03-17 DIAGNOSIS — I10 Essential (primary) hypertension: Secondary | ICD-10-CM | POA: Diagnosis present

## 2012-03-17 DIAGNOSIS — J95822 Acute and chronic postprocedural respiratory failure: Secondary | ICD-10-CM | POA: Diagnosis not present

## 2012-03-17 DIAGNOSIS — D638 Anemia in other chronic diseases classified elsewhere: Secondary | ICD-10-CM | POA: Diagnosis present

## 2012-03-17 DIAGNOSIS — E119 Type 2 diabetes mellitus without complications: Secondary | ICD-10-CM | POA: Diagnosis present

## 2012-03-17 DIAGNOSIS — G4730 Sleep apnea, unspecified: Secondary | ICD-10-CM | POA: Diagnosis present

## 2012-03-17 DIAGNOSIS — Z8701 Personal history of pneumonia (recurrent): Secondary | ICD-10-CM

## 2012-03-17 DIAGNOSIS — Z882 Allergy status to sulfonamides status: Secondary | ICD-10-CM

## 2012-03-17 DIAGNOSIS — F29 Unspecified psychosis not due to a substance or known physiological condition: Secondary | ICD-10-CM | POA: Diagnosis present

## 2012-03-17 DIAGNOSIS — I252 Old myocardial infarction: Secondary | ICD-10-CM

## 2012-03-17 DIAGNOSIS — Z860100 Personal history of colon polyps, unspecified: Secondary | ICD-10-CM

## 2012-03-17 DIAGNOSIS — H548 Legal blindness, as defined in USA: Secondary | ICD-10-CM | POA: Diagnosis present

## 2012-03-17 DIAGNOSIS — K589 Irritable bowel syndrome without diarrhea: Secondary | ICD-10-CM | POA: Diagnosis present

## 2012-03-17 DIAGNOSIS — J8410 Pulmonary fibrosis, unspecified: Secondary | ICD-10-CM | POA: Diagnosis present

## 2012-03-17 DIAGNOSIS — Z5189 Encounter for other specified aftercare: Secondary | ICD-10-CM | POA: Diagnosis not present

## 2012-03-17 DIAGNOSIS — J438 Other emphysema: Secondary | ICD-10-CM | POA: Diagnosis present

## 2012-03-17 DIAGNOSIS — F4489 Other dissociative and conversion disorders: Secondary | ICD-10-CM | POA: Diagnosis present

## 2012-03-17 DIAGNOSIS — K2970 Gastritis, unspecified, without bleeding: Secondary | ICD-10-CM | POA: Diagnosis not present

## 2012-03-17 DIAGNOSIS — K5909 Other constipation: Secondary | ICD-10-CM | POA: Diagnosis present

## 2012-03-17 DIAGNOSIS — IMO0002 Reserved for concepts with insufficient information to code with codable children: Secondary | ICD-10-CM

## 2012-03-17 DIAGNOSIS — K5900 Constipation, unspecified: Secondary | ICD-10-CM | POA: Diagnosis not present

## 2012-03-17 DIAGNOSIS — F32A Depression, unspecified: Secondary | ICD-10-CM | POA: Diagnosis present

## 2012-03-17 DIAGNOSIS — J069 Acute upper respiratory infection, unspecified: Secondary | ICD-10-CM | POA: Diagnosis not present

## 2012-03-17 DIAGNOSIS — K56609 Unspecified intestinal obstruction, unspecified as to partial versus complete obstruction: Secondary | ICD-10-CM | POA: Diagnosis not present

## 2012-03-17 DIAGNOSIS — Z87891 Personal history of nicotine dependence: Secondary | ICD-10-CM

## 2012-03-17 DIAGNOSIS — K5730 Diverticulosis of large intestine without perforation or abscess without bleeding: Secondary | ICD-10-CM | POA: Diagnosis present

## 2012-03-17 DIAGNOSIS — R61 Generalized hyperhidrosis: Secondary | ICD-10-CM | POA: Diagnosis not present

## 2012-03-17 DIAGNOSIS — E785 Hyperlipidemia, unspecified: Secondary | ICD-10-CM | POA: Diagnosis present

## 2012-03-17 DIAGNOSIS — M349 Systemic sclerosis, unspecified: Secondary | ICD-10-CM | POA: Diagnosis present

## 2012-03-17 DIAGNOSIS — J9601 Acute respiratory failure with hypoxia: Secondary | ICD-10-CM | POA: Diagnosis present

## 2012-03-17 DIAGNOSIS — IMO0001 Reserved for inherently not codable concepts without codable children: Secondary | ICD-10-CM | POA: Diagnosis present

## 2012-03-17 DIAGNOSIS — Z8719 Personal history of other diseases of the digestive system: Secondary | ICD-10-CM

## 2012-03-17 DIAGNOSIS — H543 Unqualified visual loss, both eyes: Secondary | ICD-10-CM | POA: Diagnosis not present

## 2012-03-17 DIAGNOSIS — Z79899 Other long term (current) drug therapy: Secondary | ICD-10-CM

## 2012-03-17 DIAGNOSIS — F3289 Other specified depressive episodes: Secondary | ICD-10-CM | POA: Diagnosis present

## 2012-03-17 DIAGNOSIS — Z9079 Acquired absence of other genital organ(s): Secondary | ICD-10-CM

## 2012-03-17 DIAGNOSIS — Z9089 Acquired absence of other organs: Secondary | ICD-10-CM

## 2012-03-17 LAB — COMPREHENSIVE METABOLIC PANEL
ALT: 20 U/L (ref 0–35)
AST: 20 U/L (ref 0–37)
Albumin: 3.4 g/dL — ABNORMAL LOW (ref 3.5–5.2)
Alkaline Phosphatase: 75 U/L (ref 39–117)
BUN: 19 mg/dL (ref 6–23)
CO2: 25 mEq/L (ref 19–32)
Calcium: 8.4 mg/dL (ref 8.4–10.5)
Chloride: 99 mEq/L (ref 96–112)
Creatinine, Ser: 0.8 mg/dL (ref 0.50–1.10)
GFR calc Af Amer: 79 mL/min — ABNORMAL LOW (ref >90)
GFR calc non Af Amer: 68 mL/min — ABNORMAL LOW (ref >90)
Glucose, Bld: 142 mg/dL — ABNORMAL HIGH (ref 70–99)
Potassium: 3.1 mEq/L — ABNORMAL LOW (ref 3.5–5.1)
Sodium: 135 mEq/L (ref 135–145)
Total Bilirubin: 0.2 mg/dL — ABNORMAL LOW (ref 0.3–1.2)
Total Protein: 6.2 g/dL (ref 6.0–8.3)

## 2012-03-17 LAB — GLUCOSE, CAPILLARY
Glucose-Capillary: 123 mg/dL — ABNORMAL HIGH (ref 70–99)
Glucose-Capillary: 191 mg/dL — ABNORMAL HIGH (ref 70–99)

## 2012-03-17 LAB — LIPASE, BLOOD: Lipase: 45 U/L (ref 11–59)

## 2012-03-17 LAB — CBC WITH DIFFERENTIAL/PLATELET
Basophils Absolute: 0.1 10*3/uL (ref 0.0–0.1)
Basophils Relative: 1 % (ref 0–1)
Eosinophils Absolute: 0.1 10*3/uL (ref 0.0–0.7)
Eosinophils Relative: 2 % (ref 0–5)
HCT: 29.1 % — ABNORMAL LOW (ref 36.0–46.0)
Hemoglobin: 9.1 g/dL — ABNORMAL LOW (ref 12.0–15.0)
Lymphocytes Relative: 15 % (ref 12–46)
Lymphs Abs: 1.1 10*3/uL (ref 0.7–4.0)
MCH: 23.5 pg — ABNORMAL LOW (ref 26.0–34.0)
MCHC: 31.3 g/dL (ref 30.0–36.0)
MCV: 75.2 fL — ABNORMAL LOW (ref 78.0–100.0)
Monocytes Absolute: 0.9 10*3/uL (ref 0.1–1.0)
Monocytes Relative: 12 % (ref 3–12)
Neutro Abs: 5.4 10*3/uL (ref 1.7–7.7)
Neutrophils Relative %: 71 % (ref 43–77)
Platelets: 178 10*3/uL (ref 150–400)
RBC: 3.87 MIL/uL (ref 3.87–5.11)
RDW: 15.1 % (ref 11.5–15.5)
WBC: 7.5 10*3/uL (ref 4.0–10.5)

## 2012-03-17 MED ORDER — POLYETHYLENE GLYCOL 3350 17 GM/SCOOP PO POWD
1.0000 | Freq: Once | ORAL | Status: AC
  Administered 2012-03-17: 1 via ORAL
  Filled 2012-03-17: qty 255

## 2012-03-17 MED ORDER — TRIAZOLAM 0.25 MG PO TABS
0.2500 mg | ORAL_TABLET | Freq: Every evening | ORAL | Status: DC | PRN
  Administered 2012-03-21: 0.25 mg via ORAL
  Filled 2012-03-17: qty 1

## 2012-03-17 MED ORDER — SODIUM CHLORIDE 0.9 % IV SOLN
INTRAVENOUS | Status: DC
  Administered 2012-03-17: 1000 mL via INTRAVENOUS

## 2012-03-17 MED ORDER — PREDNISONE 5 MG PO TABS
5.0000 mg | ORAL_TABLET | Freq: Every day | ORAL | Status: DC
  Administered 2012-03-17: 5 mg via ORAL
  Filled 2012-03-17 (×2): qty 1

## 2012-03-17 MED ORDER — HYDROMORPHONE HCL PF 1 MG/ML IJ SOLN
1.0000 mg | Freq: Once | INTRAMUSCULAR | Status: AC
  Administered 2012-03-17: 1 mg via INTRAVENOUS
  Filled 2012-03-17: qty 1

## 2012-03-17 MED ORDER — INSULIN ASPART 100 UNIT/ML ~~LOC~~ SOLN
0.0000 [IU] | SUBCUTANEOUS | Status: DC
  Administered 2012-03-17 – 2012-03-18 (×3): 2 [IU] via SUBCUTANEOUS

## 2012-03-17 MED ORDER — ONDANSETRON HCL 4 MG/2ML IJ SOLN
4.0000 mg | Freq: Once | INTRAMUSCULAR | Status: AC
  Administered 2012-03-17: 4 mg via INTRAVENOUS
  Filled 2012-03-17: qty 2

## 2012-03-17 MED ORDER — POTASSIUM CHLORIDE CRYS ER 20 MEQ PO TBCR
40.0000 meq | EXTENDED_RELEASE_TABLET | Freq: Once | ORAL | Status: AC
  Administered 2012-03-17: 40 meq via ORAL
  Filled 2012-03-17: qty 2

## 2012-03-17 MED ORDER — MORPHINE SULFATE 2 MG/ML IJ SOLN
2.0000 mg | INTRAMUSCULAR | Status: DC | PRN
  Administered 2012-03-18: 2 mg via INTRAVENOUS
  Filled 2012-03-17: qty 1

## 2012-03-17 MED ORDER — KCL IN DEXTROSE-NACL 20-5-0.9 MEQ/L-%-% IV SOLN
INTRAVENOUS | Status: DC
  Administered 2012-03-17: 100 mL/h via INTRAVENOUS
  Administered 2012-03-18: 03:00:00 via INTRAVENOUS
  Filled 2012-03-17 (×5): qty 1000

## 2012-03-17 MED ORDER — PANTOPRAZOLE SODIUM 40 MG IV SOLR
40.0000 mg | Freq: Every day | INTRAVENOUS | Status: DC
  Administered 2012-03-17 – 2012-03-18 (×2): 40 mg via INTRAVENOUS
  Filled 2012-03-17 (×4): qty 40

## 2012-03-17 MED ORDER — IMIPRAMINE HCL 50 MG PO TABS
75.0000 mg | ORAL_TABLET | Freq: Every day | ORAL | Status: DC
  Administered 2012-03-17 – 2012-03-18 (×2): 75 mg via ORAL
  Filled 2012-03-17 (×6): qty 1

## 2012-03-17 MED ORDER — ONDANSETRON HCL 4 MG/2ML IJ SOLN
4.0000 mg | Freq: Four times a day (QID) | INTRAMUSCULAR | Status: DC | PRN
  Administered 2012-03-18: 4 mg via INTRAVENOUS
  Filled 2012-03-17: qty 2

## 2012-03-17 NOTE — H&P (Signed)
[name] is an 78 y.o. female.   Chief Complaint: abdominal pain and inability to tolerate bowel prep HPI: patient is a 78 year old female a long history of a progressively worsening stricture of the left colon. She's had extensive workup detailed elsewhere. She is scheduled tomorrow for elective laparoscopic-assisted colectomy by Dr. Michaell Cowing. Following the start of her bowel prep today with Dulcolax tablets she developed severe crampy abdominal pain which was intolerable and her family brought her to the emergency room. Her son states that she was lying on the floor curled in the ball with pain. She has been having loose stools. No nausea or vomiting. She has not yet begun her MiraLAX.  Past Medical History  Diagnosis Date  . Coronary artery disease   . Dressler's syndrome   . Hypertension   . Dyslipidemia   . Diabetes mellitus   . CVA (cerebral infarction) 1991  . Macular degeneration   . GERD (gastroesophageal reflux disease)   . Diverticulosis   . IBS (irritable bowel syndrome)   . Zenker's diverticulum   . Emphysema   . Pulmonary hypertension   . Chronic sinusitis   . Fibromyalgia   . Hypothyroidism   . Sjogren's disease   . Raynaud's phenomenon   . Cervical disc disease   . Adenomatous colon polyp   . Scleroderma   . COPD (chronic obstructive pulmonary disease)   . Arthritis   . Blood transfusion   . Myocardial infarction may 27th 2007  . Pneumonia      HX PNEUMONIA  . Depression   . 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 Cancer     cervical cancer pre hysterectomy  . Sleep apnea     STOP BANG SCORE 5  . Legally blind     Past Surgical History  Procedure Date  . Total abdominal hysterectomy 1973  . Bilateral oophorectomy 2002  . Spinal fusion 1997    and implantation of a plate   . Lobe thyroid removal   . Thyroid lobectomy 1991    removal of left lobe thyroid  . Cholecystectomy 1965  . Appendectomy   . Cataract extraction   .  Bronchoscopy 02-22-09  . Stented cardiac  artery 2007 / 2007 / 2010    X 3 STENT    Family History  Problem Relation Age of Onset  . Other Mother 61    Died from sepsis  . Stroke Father 70    died  . Heart disease Father   . Cancer Sister     lung  . Cancer Brother     lung   Social History:  reports that she quit smoking about 23 years ago. Her smoking use included Cigarettes. She has a 40 pack-year smoking history. She does not have any smokeless tobacco history on file. She reports that she does not drink alcohol or use illicit drugs.  Allergies:  Allergies  Allergen Reactions  . Lantus (Insulin Glargine)   . Metoprolol-Hydrochlorothiazide     REACTION: decreased bp  . Sulfonamide Derivatives Hives  . Titanium     Cervical plate - had to change for stainless steel  . Adhesive (Tape) Hives and Rash     (Not in a hospital admission)  Results for orders placed during the hospital encounter of 03/17/12 (from the past 48 hour(s))  CBC WITH DIFFERENTIAL     Status: Abnormal   Collection Time   03/17/12 11:57 AM  Component Value Range Comment   WBC 7.5  4.0 - 10.5 K/uL    RBC 3.87  3.87 - 5.11 MIL/uL    Hemoglobin 9.1 (*) 12.0 - 15.0 g/dL    HCT 16.1 (*) 09.6 - 46.0 %    MCV 75.2 (*) 78.0 - 100.0 fL    MCH 23.5 (*) 26.0 - 34.0 pg    MCHC 31.3  30.0 - 36.0 g/dL    RDW 04.5  40.9 - 81.1 %    Platelets 178  150 - 400 K/uL    Neutrophils Relative 71  43 - 77 %    Neutro Abs 5.4  1.7 - 7.7 K/uL    Lymphocytes Relative 15  12 - 46 %    Lymphs Abs 1.1  0.7 - 4.0 K/uL    Monocytes Relative 12  3 - 12 %    Monocytes Absolute 0.9  0.1 - 1.0 K/uL    Eosinophils Relative 2  0 - 5 %    Eosinophils Absolute 0.1  0.0 - 0.7 K/uL    Basophils Relative 1  0 - 1 %    Basophils Absolute 0.1  0.0 - 0.1 K/uL   COMPREHENSIVE METABOLIC PANEL     Status: Abnormal   Collection Time   03/17/12 11:57 AM      Component Value Range Comment   Sodium 135  135 - 145 mEq/L    Potassium 3.1  (*) 3.5 - 5.1 mEq/L    Chloride 99  96 - 112 mEq/L    CO2 25  19 - 32 mEq/L    Glucose, Bld 142 (*) 70 - 99 mg/dL    BUN 19  6 - 23 mg/dL    Creatinine, Ser 9.14  0.50 - 1.10 mg/dL    Calcium 8.4  8.4 - 78.2 mg/dL    Total Protein 6.2  6.0 - 8.3 g/dL    Albumin 3.4 (*) 3.5 - 5.2 g/dL    AST 20  0 - 37 U/L    ALT 20  0 - 35 U/L    Alkaline Phosphatase 75  39 - 117 U/L    Total Bilirubin 0.2 (*) 0.3 - 1.2 mg/dL    GFR calc non Af Amer 68 (*) >90 mL/min    GFR calc Af Amer 79 (*) >90 mL/min   LIPASE, BLOOD     Status: Normal   Collection Time   03/17/12 11:57 AM      Component Value Range Comment   Lipase 45  11 - 59 U/L    Dg Abd Acute W/chest  03/17/2012  *RADIOLOGY REPORT*  Clinical Data: Abdominal pain.  ACUTE ABDOMEN SERIES (ABDOMEN 2 VIEW & CHEST 1 VIEW)  Comparison: No priors.  Findings: Mild diffusely increased interstitial markings are similar to prior examinations.  No acute consolidative airspace disease.  No pleural effusions.  Pulmonary vasculature and the cardiomediastinal silhouette are within normal limits. Atherosclerotic calcifications within the arch of the aorta. Surgical clips on the left side of the thoracic inlet, likely from prior thyroid surgery.  No pneumoperitoneum.  Supine and left lateral decubitus views of the abdomen demonstrate gas and stool scattered throughout the colon extending to the distal rectum.  No pathologic distension of small bowel.  Some small air fluid levels are noted on the lateral decubitus view, which are nonspecific.  Multiple pelvic phleboliths are noted, predominately on the left side of the pelvis.  IMPRESSION: 1.  Nonspecific, nonobstructive bowel gas pattern. 2.  No  pneumoperitoneum. 3.  No radiographic evidence of acute cardiopulmonary disease. 4.  Atherosclerosis.   Original Report Authenticated By: Florencia Reasons, M.D.     Review of Systems  Constitutional: Negative for fever and chills.  Respiratory: Negative for shortness of  breath and wheezing.   Cardiovascular: Negative.   Gastrointestinal: Positive for nausea, abdominal pain and constipation.    Blood pressure 116/48, pulse 74, temperature 98.1 F (36.7 C), temperature source Oral, resp. rate 15, SpO2 94.00%. Physical Exam  General: Well-developed elderly white female responsive but mildly sedated post pain medication. Skin: Warm and dry no rash or infection HEENT: Sclera nonicteric, no palpable masses Lungs: Clear equal breath sounds without wheezing or increased work of breathing Cardiac: Regular rate and rhythm without murmur. No edema. Abdomen: Mildly distended. Soft and nontender at present. No discernible masses or organomegaly. Extremities: No joint swelling or edema Neurologic: She is alert and oriented.  Assessment/Plan Colonic stricture, scheduled for elective colectomy tomorrow. She is unable to tolerate her bowel prep at home. After discussion with the patient and the family is clear she is not going to do well trying to do this at home. I will admit the patient to the hospital this afternoon and we will gently tried to continue her prep with pain and nausea medication available. She appears stable at present without any evidence of high-grade obstruction or perforation or other acute abdomen.  Gwenlyn Hottinger T 03/17/2012, 3:17 PM

## 2012-03-17 NOTE — ED Provider Notes (Signed)
History     CSN: [id number]  Arrival date & time 03/17/12  1047   First MD Initiated Contact with Patient 03/17/12 1100      Chief Complaint  Patient presents with  . Abdominal Pain  . Nausea    (Consider location/radiation/quality/duration/timing/severity/associated sxs/prior treatment) The history is provided by the patient.   Patient here complaining of sudden onset of lower abdominal pain while trying to move her bowels. Scheduled to have a colon resection tomorrow secondary to stricture. No vomiting or diarrhea or bloody stools today. No fever. Denies any urinary symptoms. Symptoms are worse with straining and better with nothing. No medications used prior to arrival Past Medical History  Diagnosis Date  . Coronary artery disease   . Dressler's syndrome   . Hypertension   . Dyslipidemia   . Diabetes mellitus   . CVA (cerebral infarction) 1991  . Macular degeneration   . GERD (gastroesophageal reflux disease)   . Diverticulosis   . IBS (irritable bowel syndrome)   . Zenker's diverticulum   . Emphysema   . Pulmonary hypertension   . Chronic sinusitis   . Fibromyalgia   . Hypothyroidism   . Sjogren's disease   . Raynaud's phenomenon   . Cervical disc disease   . Adenomatous colon polyp   . Scleroderma   . COPD (chronic obstructive pulmonary disease)   . Arthritis   . Blood transfusion   . Myocardial infarction may 27th 2007  . Pneumonia      HX PNEUMONIA  . Depression   . 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 Cancer     cervical cancer pre hysterectomy  . Sleep apnea     STOP BANG SCORE 5  . Legally blind     Past Surgical History  Procedure Date  . Total abdominal hysterectomy 1973  . Bilateral oophorectomy 2002  . Spinal fusion 1997    and implantation of a plate   . Lobe thyroid removal   . Thyroid lobectomy 1991    removal of left lobe thyroid  . Cholecystectomy 1965  . Appendectomy   . Cataract extraction   . Bronchoscopy  02-22-09  . Stented cardiac  artery 2007 / 2007 / 2010    X 3 STENT    Family History  Problem Relation Age of Onset  . Other Mother 60    Died from sepsis  . Stroke Father 52    died  . Heart disease Father   . Cancer Sister     lung  . Cancer Brother     lung    History  Substance Use Topics  . Smoking status: Former Smoker -- 1.0 packs/day for 40 years    Types: Cigarettes    Quit date: 06/26/1988  . Smokeless tobacco: Not on file   Comment: 40 pack years  . Alcohol Use: No    OB History    Grav Para Term Preterm Abortions TAB SAB Ect Mult Living                  Review of Systems  All other systems reviewed and are negative.    Allergies  Lantus; Metoprolol-hydrochlorothiazide; Sulfonamide derivatives; Titanium; and Adhesive  Home Medications   Current Outpatient Rx  Name Route Sig Dispense Refill  . AMLODIPINE BESYLATE 10 MG PO TABS Oral Take 10 mg by mouth every morning. Once a day    . ASPIRIN 325 MG PO TABS Oral Take  325 mg by mouth 2 (two) times daily.     [name] DIPHENHYDRAMINE HCL 50 MG PO TABS  One pill daily for one week, then as needed for allergies 30 tablet 0  . ESOMEPRAZOLE MAGNESIUM 40 MG PO CPDR Oral Take 1 capsule (40 mg total) by mouth daily before breakfast. 30 capsule 6  . GUAIFENESIN ER 600 MG PO TB12 Oral Take 600 mg by mouth daily.     . IMIPRAMINE HCL 25 MG PO TABS Oral Take 75 mg by mouth at bedtime.     . INSULIN LISPRO (HUMAN) 100 UNIT/ML [place] SOLN Subcutaneous Inject 5 Units into the skin 2 (two) times daily. DO NOT SUBSTITUTE PER PATIENT REQUEST    . LEVOTHYROXINE SODIUM 125 MCG PO TABS Oral Take 125 mcg by mouth every morning.     . LUBIPROSTONE 24 MCG PO CAPS Oral Take 24 mcg by mouth every morning.    . MOMETASONE FUROATE 50 MCG/ACT NA SUSP Nasal Place 2 sprays into the nose daily. As needed for congestion.    [name] NITROGLYCERIN 0.4 MG SL SUBL Sublingual Place 0.4 mg under the tongue every 5 (five) minutes as needed. For chest    .  TICLOPIDINE HCL 250 MG PO TABS Oral Take 1 tablet (250 mg total) by mouth 2 (two) times daily. 60 tablet 5  . TRIAZOLAM 0.25 MG PO TABS Oral Take 0.25 mg by mouth at bedtime as needed. For sleep      BP 116/48  Pulse 74  Temp 98.1 F (36.7 C) (Oral)  Resp 15  SpO2 94%  Physical Exam  Nursing note and vitals reviewed. Constitutional: She is oriented to person, place, and time. She appears well-developed and well-nourished.  Non-toxic appearance. No distress.  HENT:  Head: Normocephalic and atraumatic.  Eyes: Conjunctivae normal, EOM and lids are normal. Pupils are equal, round, and reactive to light.  Neck: Normal range of motion. Neck supple. No tracheal deviation present. No mass present.  Cardiovascular: Normal rate, regular rhythm and normal heart sounds.  Exam reveals no gallop.   No murmur heard. Pulmonary/Chest: Effort normal and breath sounds normal. No stridor. No respiratory distress. She has no decreased breath sounds. She has no wheezes. She has no rhonchi. She has no rales.  Abdominal: Soft. Normal appearance and bowel sounds are normal. She exhibits no distension. There is tenderness in the suprapubic area. There is no rigidity, no rebound, no guarding and no CVA tenderness.  Musculoskeletal: Normal range of motion. She exhibits no edema and no tenderness.  Neurological: She is alert and oriented to person, place, and time. She has normal strength. No cranial nerve deficit or sensory deficit. GCS eye subscore is 4. GCS verbal subscore is 5. GCS motor subscore is 6.  Skin: Skin is warm and dry. No abrasion and no rash noted.  Psychiatric: She has a normal mood and affect. Her speech is normal and behavior is normal.    ED Course  Procedures (including critical care time)  Labs Reviewed - No data to display No results found.   No diagnosis found.    MDM  Patient given IV fluids and pain medication here and does flow better. Spoke with Dr. Johna Sheriff, and he will come  to evaluate the patient. Patient states that she has symptoms after drinking the solution for her bowel prep and is concerned as she goes home the same to happen again        Toy Baker, MD 03/17/12 1311

## 2012-03-17 NOTE — ED Notes (Signed)
Pt able to pass one small hard ball of stool.

## 2012-03-17 NOTE — ED Notes (Signed)
MD Hoxworth at pt bedside to evaluate patient. Pt to be admitted prior to procedure tomorrow. Pt daughter at bedside to hear discussion and plan

## 2012-03-17 NOTE — ED Notes (Signed)
Pt passed large amount of soft brown stool. Pt cleaned and bed linen changed, Pt reports relief of pain.

## 2012-03-17 NOTE — ED Notes (Signed)
Pt reports sudden onset of severe sharp central lower abdominal pain while attempting to have a BM this morning. Pt reports she was unable to have a BM.  Pt denies current nausea and states pain was initially constant, but now pain is intermittent and cramping since pain medications given by EMS. Pt reports pain is 10/10 when at the most severe and 6/10 at best.  Pt took 4 dulcolax at 6AM.  Pt bowel sounds are hypoactive at this time. Pt is scheduled for surgery - colon resection-tomorrow morning as she has a stricture of colon.

## 2012-03-17 NOTE — ED Notes (Signed)
Per EMS. Pt is from home complains of lower abdominal pain starting this AM while sitting on toilet attempting to have a BM.  Pt unable to pass stool. Pt had sudden onset of abdominal pain. Pt reports pain constant. Pt pain initially 10/10.  Nausea, no vomiting and diaphoretic on scene. Pt was given of fentanyl en route. Pt has IV 22g to right hand. Pt is scheduled to have an operation on colon stricture tomorrow.

## 2012-03-17 NOTE — ED Notes (Signed)
Patient taken to radiology 

## 2012-03-18 ENCOUNTER — Ambulatory Visit (HOSPITAL_COMMUNITY): Admission: RE | Admit: 2012-03-18 | Payer: Medicare Other | Source: Ambulatory Visit | Admitting: Surgery

## 2012-03-18 ENCOUNTER — Encounter (HOSPITAL_COMMUNITY): Payer: Self-pay | Admitting: Anesthesiology

## 2012-03-18 ENCOUNTER — Inpatient Hospital Stay (HOSPITAL_COMMUNITY): Payer: Medicare Other | Admitting: Anesthesiology

## 2012-03-18 ENCOUNTER — Encounter (HOSPITAL_COMMUNITY)
Admission: EM | Disposition: A | Discharge status: Skilled Nursing Facility | Payer: Self-pay | Source: Home / Self Care | Attending: Surgery

## 2012-03-18 DIAGNOSIS — J8409 Other alveolar and parieto-alveolar conditions: Secondary | ICD-10-CM

## 2012-03-18 DIAGNOSIS — K565 Intestinal adhesions [bands], unspecified as to partial versus complete obstruction: Secondary | ICD-10-CM

## 2012-03-18 DIAGNOSIS — J438 Other emphysema: Secondary | ICD-10-CM

## 2012-03-18 DIAGNOSIS — J9600 Acute respiratory failure, unspecified whether with hypoxia or hypercapnia: Secondary | ICD-10-CM | POA: Diagnosis not present

## 2012-03-18 HISTORY — PX: PROCTOSCOPY: SHX2266

## 2012-03-18 LAB — GLUCOSE, CAPILLARY
Glucose-Capillary: 120 mg/dL — ABNORMAL HIGH (ref 70–99)
Glucose-Capillary: 149 mg/dL — ABNORMAL HIGH (ref 70–99)
Glucose-Capillary: 151 mg/dL — ABNORMAL HIGH (ref 70–99)
Glucose-Capillary: 153 mg/dL — ABNORMAL HIGH (ref 70–99)
Glucose-Capillary: 169 mg/dL — ABNORMAL HIGH (ref 70–99)
Glucose-Capillary: 199 mg/dL — ABNORMAL HIGH (ref 70–99)

## 2012-03-18 LAB — BASIC METABOLIC PANEL
BUN: 6 mg/dL (ref 6–23)
CO2: 16 mEq/L — ABNORMAL LOW (ref 19–32)
Calcium: 5.3 mg/dL — CL (ref 8.4–10.5)
Chloride: 119 mEq/L — ABNORMAL HIGH (ref 96–112)
Creatinine, Ser: 0.46 mg/dL — ABNORMAL LOW (ref 0.50–1.10)
GFR calc Af Amer: >90 mL/min (ref >90)
GFR calc non Af Amer: >90 mL/min (ref >90)
Glucose, Bld: 151 mg/dL — ABNORMAL HIGH (ref 70–99)
Potassium: 2.7 mEq/L — CL (ref 3.5–5.1)
Sodium: 142 mEq/L (ref 135–145)

## 2012-03-18 LAB — PROTIME-INR
INR: 1.08 (ref 0.00–1.49)
Prothrombin Time: 13.9 seconds (ref 11.6–15.2)

## 2012-03-18 LAB — TYPE AND SCREEN
ABO/RH(D): B POS
Antibody Screen: NEGATIVE

## 2012-03-18 LAB — HEMOGLOBIN AND HEMATOCRIT, BLOOD
HCT: 23.8 % — ABNORMAL LOW (ref 36.0–46.0)
Hemoglobin: 7.4 g/dL — ABNORMAL LOW (ref 12.0–15.0)

## 2012-03-18 SURGERY — LAPAROSCOPIC PARTIAL COLECTOMY
Anesthesia: General | Site: Rectum | Wound class: Contaminated

## 2012-03-18 MED ORDER — LIP MEDEX EX OINT
1.0000 "application " | TOPICAL_OINTMENT | Freq: Two times a day (BID) | CUTANEOUS | Status: DC
  Administered 2012-03-18 – 2012-03-22 (×7): 1 via TOPICAL
  Filled 2012-03-18: qty 7

## 2012-03-18 MED ORDER — DEXTROSE 5 % IV SOLN
1.0000 g | Freq: Two times a day (BID) | INTRAVENOUS | Status: AC
  Administered 2012-03-18: 1 g via INTRAVENOUS
  Filled 2012-03-18: qty 1

## 2012-03-18 MED ORDER — LACTATED RINGERS IV SOLN: INTRAVENOUS | Status: DC

## 2012-03-18 MED ORDER — MAGIC MOUTHWASH
15.0000 mL | Freq: Four times a day (QID) | ORAL | Status: DC | PRN
  Filled 2012-03-18: qty 15

## 2012-03-18 MED ORDER — AMLODIPINE BESYLATE 5 MG PO TABS
5.0000 mg | ORAL_TABLET | Freq: Every morning | ORAL | Status: DC
  Administered 2012-03-20 – 2012-03-22 (×3): 5 mg via ORAL
  Filled 2012-03-18 (×6): qty 1

## 2012-03-18 MED ORDER — SACCHAROMYCES BOULARDII 250 MG PO CAPS
250.0000 mg | ORAL_CAPSULE | Freq: Two times a day (BID) | ORAL | Status: DC
  Administered 2012-03-18 – 2012-03-22 (×8): 250 mg via ORAL
  Filled 2012-03-18 (×11): qty 1

## 2012-03-18 MED ORDER — PROMETHAZINE HCL 25 MG/ML IJ SOLN: 6.2500 mg | INTRAMUSCULAR | Status: DC | PRN

## 2012-03-18 MED ORDER — SODIUM CHLORIDE 0.9 % IV SOLN
INTRAVENOUS | Status: DC
  Filled 2012-03-18: qty 6

## 2012-03-18 MED ORDER — HEPARIN SODIUM (PORCINE) 5000 UNIT/ML IJ SOLN
5000.0000 [IU] | Freq: Three times a day (TID) | INTRAMUSCULAR | Status: DC
  Administered 2012-03-19 – 2012-03-21 (×4): 5000 [IU] via SUBCUTANEOUS
  Filled 2012-03-18 (×11): qty 1

## 2012-03-18 MED ORDER — ALBUTEROL SULFATE (5 MG/ML) 0.5% IN NEBU: 2.5000 mg | INHALATION_SOLUTION | RESPIRATORY_TRACT | Status: DC | PRN

## 2012-03-18 MED ORDER — LACTATED RINGERS IV SOLN
INTRAVENOUS | Status: DC | PRN
  Administered 2012-03-18 (×2): via INTRAVENOUS

## 2012-03-18 MED ORDER — BUPIVACAINE 0.25 % ON-Q PUMP DUAL CATH 300 ML
300.0000 mL | INJECTION | Status: DC
  Filled 2012-03-18: qty 300

## 2012-03-18 MED ORDER — ACETAMINOPHEN 10 MG/ML IV SOLN
INTRAVENOUS | Status: DC | PRN
  Administered 2012-03-18: 1000 mg via INTRAVENOUS

## 2012-03-18 MED ORDER — TRAMADOL HCL 50 MG PO TABS: 50.0000 mg | ORAL_TABLET | Freq: Four times a day (QID) | ORAL | Status: DC | PRN

## 2012-03-18 MED ORDER — LACTATED RINGERS IV BOLUS (SEPSIS): 1000.0000 mL | Freq: Three times a day (TID) | INTRAVENOUS | Status: AC | PRN

## 2012-03-18 MED ORDER — NEOSTIGMINE METHYLSULFATE 1 MG/ML IJ SOLN
INTRAMUSCULAR | Status: DC | PRN
  Administered 2012-03-18: 3 mg via INTRAVENOUS

## 2012-03-18 MED ORDER — FLUTICASONE PROPIONATE 50 MCG/ACT NA SUSP
2.0000 | Freq: Every day | NASAL | Status: DC
  Administered 2012-03-19 – 2012-03-22 (×4): 2 via NASAL
  Filled 2012-03-18: qty 16

## 2012-03-18 MED ORDER — LEVOTHYROXINE SODIUM 125 MCG PO TABS
125.0000 ug | ORAL_TABLET | Freq: Every morning | ORAL | Status: DC
  Administered 2012-03-18 – 2012-03-22 (×5): 125 ug via ORAL
  Filled 2012-03-18 (×6): qty 1

## 2012-03-18 MED ORDER — HYDROMORPHONE HCL PF 1 MG/ML IJ SOLN
0.5000 mg | INTRAMUSCULAR | Status: DC | PRN
  Administered 2012-03-18 (×2): 0.5 mg via INTRAVENOUS
  Administered 2012-03-18 – 2012-03-19 (×3): 1 mg via INTRAVENOUS
  Filled 2012-03-18 (×5): qty 1

## 2012-03-18 MED ORDER — PHENYLEPHRINE HCL 10 MG/ML IJ SOLN
10.0000 mg | INTRAVENOUS | Status: DC | PRN
  Administered 2012-03-18: 50 ug/min via INTRAVENOUS
  Administered 2012-03-18: 15 ug/min via INTRAVENOUS

## 2012-03-18 MED ORDER — DIPHENHYDRAMINE HCL 50 MG/ML IJ SOLN: 12.5000 mg | Freq: Four times a day (QID) | INTRAMUSCULAR | Status: DC | PRN

## 2012-03-18 MED ORDER — SODIUM CHLORIDE 0.9 % IV SOLN
1.0000 g | Freq: Once | INTRAVENOUS | Status: AC
  Administered 2012-03-18: 1 g via INTRAVENOUS
  Filled 2012-03-18 (×2): qty 10

## 2012-03-18 MED ORDER — LIDOCAINE HCL (CARDIAC) 20 MG/ML IV SOLN
INTRAVENOUS | Status: DC | PRN
  Administered 2012-03-18: 30 mg via INTRAVENOUS

## 2012-03-18 MED ORDER — PROPOFOL 10 MG/ML IV EMUL
INTRAVENOUS | Status: DC | PRN
  Administered 2012-03-18: 100 mg via INTRAVENOUS

## 2012-03-18 MED ORDER — INSULIN LISPRO 100 UNIT/ML ~~LOC~~ SOLN: Freq: Three times a day (TID) | SUBCUTANEOUS | Status: DC

## 2012-03-18 MED ORDER — PHENOL 1.4 % MT LIQD: 2.0000 | OROMUCOSAL | Status: DC | PRN

## 2012-03-18 MED ORDER — ALVIMOPAN 12 MG PO CAPS
12.0000 mg | ORAL_CAPSULE | Freq: Once | ORAL | Status: AC
  Administered 2012-03-18: 12 mg via ORAL
  Filled 2012-03-18: qty 1

## 2012-03-18 MED ORDER — BUPIVACAINE-EPINEPHRINE 0.25% -1:200000 IJ SOLN
INTRAMUSCULAR | Status: DC | PRN
  Administered 2012-03-18: 50 mL

## 2012-03-18 MED ORDER — ASPIRIN 325 MG PO TABS
325.0000 mg | ORAL_TABLET | Freq: Two times a day (BID) | ORAL | Status: DC
  Administered 2012-03-18 – 2012-03-20 (×4): 325 mg via ORAL
  Filled 2012-03-18 (×9): qty 1

## 2012-03-18 MED ORDER — INSULIN ASPART 100 UNIT/ML ~~LOC~~ SOLN: 0.0000 [IU] | Freq: Three times a day (TID) | SUBCUTANEOUS | Status: DC

## 2012-03-18 MED ORDER — PANTOPRAZOLE SODIUM 40 MG PO TBEC
80.0000 mg | DELAYED_RELEASE_TABLET | Freq: Every day | ORAL | Status: DC
  Administered 2012-03-18 – 2012-03-22 (×5): 80 mg via ORAL
  Filled 2012-03-18 (×5): qty 2

## 2012-03-18 MED ORDER — HYDROCORTISONE SOD SUCCINATE 100 MG IJ SOLR
50.0000 mg | Freq: Two times a day (BID) | INTRAMUSCULAR | Status: DC
  Administered 2012-03-18 – 2012-03-19 (×2): 50 mg via INTRAVENOUS
  Filled 2012-03-18: qty 2
  Filled 2012-03-18: qty 1
  Filled 2012-03-18: qty 2
  Filled 2012-03-18: qty 1

## 2012-03-18 MED ORDER — INSULIN LISPRO 100 UNIT/ML ~~LOC~~ SOLN
5.0000 [IU] | Freq: Two times a day (BID) | SUBCUTANEOUS | Status: DC
  Filled 2012-03-18: qty 3

## 2012-03-18 MED ORDER — ALVIMOPAN 12 MG PO CAPS
12.0000 mg | ORAL_CAPSULE | Freq: Two times a day (BID) | ORAL | Status: DC
  Administered 2012-03-19 – 2012-03-20 (×4): 12 mg via ORAL
  Filled 2012-03-18 (×7): qty 1

## 2012-03-18 MED ORDER — HETASTARCH-ELECTROLYTES 6 % IV SOLN
INTRAVENOUS | Status: DC | PRN
  Administered 2012-03-18: 09:00:00 via INTRAVENOUS

## 2012-03-18 MED ORDER — LUBIPROSTONE 24 MCG PO CAPS
24.0000 ug | ORAL_CAPSULE | Freq: Every morning | ORAL | Status: DC
  Administered 2012-03-19 – 2012-03-22 (×4): 24 ug via ORAL
  Filled 2012-03-18 (×5): qty 1

## 2012-03-18 MED ORDER — INSULIN LISPRO 100 UNIT/ML ~~LOC~~ SOLN
5.0000 [IU] | Freq: Two times a day (BID) | SUBCUTANEOUS | Status: DC
  Administered 2012-03-20 – 2012-03-22 (×5): 5 [IU] via SUBCUTANEOUS
  Filled 2012-03-18: qty 3

## 2012-03-18 MED ORDER — MIDAZOLAM HCL 5 MG/5ML IJ SOLN
INTRAMUSCULAR | Status: DC | PRN
  Administered 2012-03-18: 0.5 mg via INTRAVENOUS

## 2012-03-18 MED ORDER — GLYCOPYRROLATE 0.2 MG/ML IJ SOLN
INTRAMUSCULAR | Status: DC | PRN
  Administered 2012-03-18: 0.4 mg via INTRAVENOUS

## 2012-03-18 MED ORDER — CISATRACURIUM BESYLATE (PF) 10 MG/5ML IV SOLN
INTRAVENOUS | Status: DC | PRN
  Administered 2012-03-18: 6 mg via INTRAVENOUS
  Administered 2012-03-18: 2 mg via INTRAVENOUS
  Administered 2012-03-18: 1 mg via INTRAVENOUS

## 2012-03-18 MED ORDER — PHENYLEPHRINE HCL 10 MG/ML IJ SOLN
INTRAMUSCULAR | Status: DC | PRN
  Administered 2012-03-18 (×2): 40 ug via INTRAVENOUS
  Administered 2012-03-18: 20 ug via INTRAVENOUS

## 2012-03-18 MED ORDER — METOPROLOL TARTRATE 1 MG/ML IV SOLN
INTRAVENOUS | Status: AC
  Filled 2012-03-18: qty 5

## 2012-03-18 MED ORDER — 0.9 % SODIUM CHLORIDE (POUR BTL) OPTIME
TOPICAL | Status: DC | PRN
  Administered 2012-03-18: 1000 mL

## 2012-03-18 MED ORDER — LORAZEPAM 2 MG/ML IJ SOLN
0.5000 mg | Freq: Three times a day (TID) | INTRAMUSCULAR | Status: AC | PRN
  Administered 2012-03-19: 1 mg via INTRAVENOUS
  Filled 2012-03-18: qty 1

## 2012-03-18 MED ORDER — ONDANSETRON HCL 4 MG/2ML IJ SOLN
INTRAMUSCULAR | Status: DC | PRN
  Administered 2012-03-18 (×2): 2 mg via INTRAVENOUS

## 2012-03-18 MED ORDER — MENTHOL 3 MG MT LOZG: 1.0000 | LOZENGE | OROMUCOSAL | Status: DC | PRN

## 2012-03-18 MED ORDER — LACTATED RINGERS IV SOLN
INTRAVENOUS | Status: DC | PRN
  Administered 2012-03-18: 09:00:00 via INTRAVENOUS

## 2012-03-18 MED ORDER — INSULIN LISPRO 100 UNIT/ML ~~LOC~~ SOLN
0.0000 [IU] | Freq: Three times a day (TID) | SUBCUTANEOUS | Status: DC
  Administered 2012-03-18 – 2012-03-20 (×3): 2 [IU] via SUBCUTANEOUS
  Administered 2012-03-20: 3 [IU] via SUBCUTANEOUS
  Administered 2012-03-20: 2 [IU] via SUBCUTANEOUS
  Administered 2012-03-21 (×3): 5 [IU] via SUBCUTANEOUS
  Administered 2012-03-22: 3 [IU] via SUBCUTANEOUS
  Administered 2012-03-22: 2 [IU] via SUBCUTANEOUS
  Filled 2012-03-18: qty 3

## 2012-03-18 MED ORDER — DEXTROSE 5 % IV SOLN
2.0000 g | INTRAVENOUS | Status: AC
  Administered 2012-03-18: 2 g via INTRAVENOUS
  Filled 2012-03-18: qty 2

## 2012-03-18 MED ORDER — AMLODIPINE BESYLATE 5 MG PO TABS: 5.0000 mg | ORAL_TABLET | Freq: Every morning | ORAL | Status: DC

## 2012-03-18 MED ORDER — LORAZEPAM BOLUS VIA INFUSION: 0.5000 mg | Freq: Three times a day (TID) | INTRAVENOUS | Status: DC | PRN

## 2012-03-18 MED ORDER — FENTANYL CITRATE 0.05 MG/ML IJ SOLN: 25.0000 ug | INTRAMUSCULAR | Status: DC | PRN

## 2012-03-18 MED ORDER — SUCCINYLCHOLINE CHLORIDE 20 MG/ML IJ SOLN
INTRAMUSCULAR | Status: DC | PRN
  Administered 2012-03-18: 100 mg via INTRAVENOUS

## 2012-03-18 MED ORDER — ALUM & MAG HYDROXIDE-SIMETH 200-200-20 MG/5ML PO SUSP
30.0000 mL | Freq: Four times a day (QID) | ORAL | Status: DC | PRN
  Administered 2012-03-21: 30 mL via ORAL
  Filled 2012-03-18: qty 30

## 2012-03-18 MED ORDER — LACTATED RINGERS IR SOLN
Status: DC | PRN
  Administered 2012-03-18: 3000 mL

## 2012-03-18 MED ORDER — INSULIN LISPRO 100 UNIT/ML ~~LOC~~ SOLN: Freq: Every day | SUBCUTANEOUS | Status: DC

## 2012-03-18 MED ORDER — PROMETHAZINE HCL 25 MG/ML IJ SOLN: 12.5000 mg | Freq: Four times a day (QID) | INTRAMUSCULAR | Status: DC | PRN

## 2012-03-18 MED ORDER — POTASSIUM CHLORIDE 10 MEQ/100ML IV SOLN
10.0000 meq | INTRAVENOUS | Status: DC
  Administered 2012-03-18 (×3): 10 meq via INTRAVENOUS
  Filled 2012-03-18: qty 800

## 2012-03-18 MED ORDER — FENTANYL CITRATE 0.05 MG/ML IJ SOLN
INTRAMUSCULAR | Status: DC | PRN
  Administered 2012-03-18: 25 ug via INTRAVENOUS
  Administered 2012-03-18: 75 ug via INTRAVENOUS
  Administered 2012-03-18: 25 ug via INTRAVENOUS
  Administered 2012-03-18 (×2): 50 ug via INTRAVENOUS
  Administered 2012-03-18 (×5): 25 ug via INTRAVENOUS

## 2012-03-18 MED ORDER — HYDRALAZINE HCL 20 MG/ML IJ SOLN
20.0000 mg | Freq: Four times a day (QID) | INTRAMUSCULAR | Status: DC | PRN
  Administered 2012-03-18: 20 mg via INTRAVENOUS
  Filled 2012-03-18: qty 1

## 2012-03-18 MED ORDER — POTASSIUM CHLORIDE 20 MEQ/15ML (10%) PO LIQD
40.0000 meq | ORAL | Status: AC
  Administered 2012-03-18 – 2012-03-19 (×2): 40 meq via ORAL
  Filled 2012-03-18 (×2): qty 30

## 2012-03-18 MED ORDER — OCUVITE PO TABS
1.0000 | ORAL_TABLET | Freq: Every day | ORAL | Status: DC
  Administered 2012-03-19 – 2012-03-22 (×4): 1 via ORAL
  Filled 2012-03-18 (×5): qty 1

## 2012-03-18 MED ORDER — ACETAMINOPHEN 325 MG PO TABS
650.0000 mg | ORAL_TABLET | Freq: Four times a day (QID) | ORAL | Status: DC
  Administered 2012-03-18 – 2012-03-20 (×5): 650 mg via ORAL
  Administered 2012-03-20: 325 mg via ORAL
  Filled 2012-03-18 (×13): qty 2

## 2012-03-18 MED ORDER — HEPARIN SODIUM (PORCINE) 5000 UNIT/ML IJ SOLN
5000.0000 [IU] | Freq: Once | INTRAMUSCULAR | Status: AC
  Administered 2012-03-18: 5000 [IU] via SUBCUTANEOUS
  Filled 2012-03-18: qty 1

## 2012-03-18 SURGICAL SUPPLY — 86 items
APPLIER CLIP 5 13 M/L LIGAMAX5 (MISCELLANEOUS)
APPLIER CLIP ROT 10 11.4 M/L (STAPLE)
APR CLP MED LRG 11.4X10 (STAPLE)
APR CLP MED LRG 5 ANG JAW (MISCELLANEOUS)
BLADE EXTENDED COATED 6.5IN (ELECTRODE) ×4 IMPLANT
BLADE HEX COATED 2.75 (ELECTRODE) ×4 IMPLANT
BLADE SURG SZ10 CARB STEEL (BLADE) ×4 IMPLANT
CABLE HIGH FREQUENCY MONO STRZ (ELECTRODE) ×4 IMPLANT
CANISTER SUCTION 2500CC (MISCELLANEOUS) ×4 IMPLANT
CATH KIT ON Q 5IN DUAL SLV (PAIN MANAGEMENT) ×4 IMPLANT
CELLS DAT CNTRL 66122 CELL SVR (MISCELLANEOUS) IMPLANT
CLIP APPLIE 5 13 M/L LIGAMAX5 (MISCELLANEOUS) IMPLANT
CLIP APPLIE ROT 10 11.4 M/L (STAPLE) IMPLANT
CLOTH BEACON ORANGE TIMEOUT ST (SAFETY) ×4 IMPLANT
CLSR STERI-STRIP ANTIMIC 1/2X4 (GAUZE/BANDAGES/DRESSINGS) ×4 IMPLANT
COVER MAYO STAND STRL (DRAPES) ×4 IMPLANT
DECANTER SPIKE VIAL GLASS SM (MISCELLANEOUS) ×4 IMPLANT
DRAIN CHANNEL 19F RND (DRAIN) IMPLANT
DRAPE LAPAROSCOPIC ABDOMINAL (DRAPES) ×4 IMPLANT
DRAPE LG THREE QUARTER DISP (DRAPES) ×7 IMPLANT
DRAPE UTILITY 15X26 (DRAPE) ×5 IMPLANT
DRAPE WARM FLUID 44X44 (DRAPE) ×8 IMPLANT
DRSG TEGADERM 2-3/8X2-3/4 SM (GAUZE/BANDAGES/DRESSINGS) ×15 IMPLANT
DRSG TEGADERM 4X4.75 (GAUZE/BANDAGES/DRESSINGS) ×6 IMPLANT
ELECT REM PT RETURN 9FT ADLT (ELECTROSURGICAL) ×4
ELECTRODE REM PT RTRN 9FT ADLT (ELECTROSURGICAL) ×3 IMPLANT
FILTER SMOKE EVAC LAPAROSHD (FILTER) IMPLANT
GAUZE SPONGE 2X2 8PLY STRL LF (GAUZE/BANDAGES/DRESSINGS) ×2 IMPLANT
GLOVE BIOGEL PI IND STRL 7.0 (GLOVE) ×3 IMPLANT
GLOVE BIOGEL PI INDICATOR 7.0 (GLOVE) ×1
GLOVE ECLIPSE 8.0 STRL XLNG CF (GLOVE) ×8 IMPLANT
GLOVE INDICATOR 8.0 STRL GRN (GLOVE) ×4 IMPLANT
GOWN STRL NON-REIN LRG LVL3 (GOWN DISPOSABLE) ×4 IMPLANT
GOWN STRL REIN XL XLG (GOWN DISPOSABLE) ×16 IMPLANT
HAND ACTIVATED (MISCELLANEOUS) IMPLANT
KIT BASIN OR (CUSTOM PROCEDURE TRAY) ×4 IMPLANT
LEGGING LITHOTOMY PAIR STRL (DRAPES) ×3 IMPLANT
LIGASURE IMPACT 36 18CM CVD LR (INSTRUMENTS) IMPLANT
NS IRRIG 1000ML POUR BTL (IV SOLUTION) ×7 IMPLANT
PENCIL BUTTON HOLSTER BLD 10FT (ELECTRODE) ×4 IMPLANT
RTRCTR WOUND ALEXIS 18CM MED (MISCELLANEOUS)
SCISSORS LAP 5X35 DISP (ENDOMECHANICALS) ×4 IMPLANT
SEALER TISSUE G2 CVD JAW 35 (ENDOMECHANICALS) ×3 IMPLANT
SEALER TISSUE G2 CVD JAW 45CM (ENDOMECHANICALS) ×1
SET IRRIG TUBING LAPAROSCOPIC (IRRIGATION / IRRIGATOR) ×4 IMPLANT
SLEEVE Z-THREAD 5X100MM (TROCAR) ×4 IMPLANT
SPONGE GAUZE 2X2 STER 10/PKG (GAUZE/BANDAGES/DRESSINGS) ×1
SPONGE GAUZE 4X4 12PLY (GAUZE/BANDAGES/DRESSINGS) ×2 IMPLANT
SPONGE LAP 18X18 X RAY DECT (DISPOSABLE) ×11 IMPLANT
STAPLER VISISTAT 35W (STAPLE) ×4 IMPLANT
SUCTION POOLE TIP (SUCTIONS) ×4 IMPLANT
SUT ETHILON 2 0 PS N (SUTURE) IMPLANT
SUT MNCRL 0 MO-4 VIOLET 18 CR (SUTURE) ×4 IMPLANT
SUT MONOCRYL 0 MO 4 18  CR/8 (SUTURE)
SUT PDS AB 1 CTX 36 (SUTURE) ×8 IMPLANT
SUT PDS AB 1 TP1 96 (SUTURE) IMPLANT
SUT PDS AB 2-0 CT2 27 (SUTURE) ×1 IMPLANT
SUT PDS AB 3-0 SH 27 (SUTURE) ×8 IMPLANT
SUT PROLENE 0 CT 2 (SUTURE) ×1 IMPLANT
SUT PROLENE 2 0 CT2 30 (SUTURE) ×4 IMPLANT
SUT PROLENE 2 0 KS (SUTURE) IMPLANT
SUT SILK 2 0 (SUTURE) ×4
SUT SILK 2 0 SH CR/8 (SUTURE) ×4 IMPLANT
SUT SILK 2-0 18XBRD TIE 12 (SUTURE) ×3 IMPLANT
SUT SILK 3 0 (SUTURE) ×4
SUT SILK 3 0 SH CR/8 (SUTURE) ×4 IMPLANT
SUT SILK 3-0 18XBRD TIE 12 (SUTURE) ×3 IMPLANT
SUT VIC AB 2-0 SH 18 (SUTURE) IMPLANT
SUT VIC AB 3-0 SH 18 (SUTURE) ×12 IMPLANT
SUT VICRYL 2 0 18  UND BR (SUTURE)
SUT VICRYL 2 0 18 UND BR (SUTURE) IMPLANT
SYS LAPSCP GELPORT 120MM (MISCELLANEOUS) ×4
SYSTEM LAPSCP GELPORT 120MM (MISCELLANEOUS) ×1 IMPLANT
TAPE UMBILICAL COTTON 1/8X30 (MISCELLANEOUS) ×4 IMPLANT
TOWEL OR 17X26 10 PK STRL BLUE (TOWEL DISPOSABLE) ×5 IMPLANT
TOWEL OR NON WOVEN STRL DISP B (DISPOSABLE) ×5 IMPLANT
TRAY FOLEY CATH 14FRSI W/METER (CATHETERS) ×4 IMPLANT
TRAY LAP CHOLE (CUSTOM PROCEDURE TRAY) ×4 IMPLANT
TROCAR XCEL NON-BLD 5MMX100MML (ENDOMECHANICALS) ×4 IMPLANT
TROCAR Z-THREAD FIOS 11X100 BL (TROCAR) ×1 IMPLANT
TROCAR Z-THREAD FIOS 5X100MM (TROCAR) ×12 IMPLANT
TROCAR Z-THREAD SLEEVE 11X100 (TROCAR) IMPLANT
TUBING CONNECTING 10 (TUBING) ×2 IMPLANT
TUBING FILTER THERMOFLATOR (ELECTROSURGICAL) ×4 IMPLANT
YANKAUER SUCT BULB TIP 10FT TU (MISCELLANEOUS) ×4 IMPLANT
YANKAUER SUCT BULB TIP NO VENT (SUCTIONS) ×4 IMPLANT

## 2012-03-18 NOTE — Consult Note (Signed)
Name: [name] MRN: [id number] DOB: January 14, 1931    LOS: 1  Referring Provider:  Dr Michaell Cowing Reason for Referral:  Post op pulmonary care.  PULMONARY / CRITICAL CARE MEDICINE  HPI: [id number] remote smoker pulmonary pt of Dr Craige Cotta with clinical BOOP, Emphysema and chronic steroid dependency.   She underwent laparotomy 9/23 per Dr Michaell Cowing for rectosigmoid resection. She was intubated and extubated in OR and transferred to ICU. PCCM asked to evaluate pm 9/23  for post op 02 dep resp failure (not on 02 at baseline)  Preop had "discovered" her insulin with sulpha was causing most of her pulmonary problems and changed to humalog and all her problems went away for 4 weeks with resolution of cough and congestion albeit on prednisone maint rx "for copd and scleroderma".   No limiting doe though very sedentary  Sleeping ok without nocturnal  or early am exacerbation  of respiratory  c/o's or need for noct saba. Also denies any obvious fluctuation of symptoms with weather or environmental changes or other aggravating or alleviating factors except as outlined above     Past Medical History  Diagnosis Date  . Coronary artery disease   . Dressler's syndrome   . Hypertension   . Dyslipidemia   . Diabetes mellitus   . CVA (cerebral infarction) 1991  . Macular degeneration   . GERD (gastroesophageal reflux disease)   . Diverticulosis   . IBS (irritable bowel syndrome)   . Zenker's diverticulum   . Emphysema   . Pulmonary hypertension   . Chronic sinusitis   . Fibromyalgia   . Hypothyroidism   . Sjogren's disease   . Raynaud's phenomenon   . Cervical disc disease   . Adenomatous colon polyp   . Scleroderma   . COPD (chronic obstructive pulmonary disease)   . Arthritis   . Blood transfusion   . Myocardial infarction may 27th 2007  . Pneumonia      HX PNEUMONIA  . Depression   . 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 Cancer     cervical cancer pre hysterectomy  . Sleep  apnea     STOP BANG SCORE 5  . Legally blind    Past Surgical History  Procedure Date  . Total abdominal hysterectomy 1973  . Bilateral oophorectomy 2002  . Spinal fusion 1997    and implantation of a plate   . Lobe thyroid removal   . Thyroid lobectomy 1991    removal of left lobe thyroid  . Cholecystectomy 1965  . Appendectomy   . Cataract extraction   . Bronchoscopy 02-22-09  . Stented cardiac  artery 2007 / 2007 / 2010    X 3 STENT   Prior to Admission medications   Medication Sig Start Date End Date Taking? Authorizing Provider  amLODipine (NORVASC) 10 MG tablet Take 10 mg by mouth every morning. Once a day 07/06/11  Yes Historical Provider, MD  aspirin 325 MG tablet Take 325 mg by mouth 2 (two) times daily.    Yes Historical Provider, MD  beta carotene w/minerals (OCUVITE) tablet Take 1 tablet by mouth daily.   Yes Historical Provider, MD  esomeprazole (NEXIUM) 40 MG capsule Take 1 capsule (40 mg total) by mouth daily before breakfast. 12/29/11  Yes Pricilla Riffle, MD  imipramine (TOFRANIL) 25 MG tablet Take 75 mg by mouth at bedtime.  03/22/11  Yes Historical Provider, MD  insulin lispro (HUMALOG) 100 UNIT/ML injection Inject  5 Units into the skin 2 (two) times daily. DO NOT SUBSTITUTE PER PATIENT REQUEST   Yes Historical Provider, MD  levothyroxine (SYNTHROID, LEVOTHROID) 125 MCG tablet Take 125 mcg by mouth every morning.    Yes Historical Provider, MD  lubiprostone (AMITIZA) 24 MCG capsule Take 24 mcg by mouth every morning.   Yes Historical Provider, MD  mometasone (NASONEX) 50 MCG/ACT nasal spray Place 2 sprays into the nose daily. As needed for congestion.   Yes Historical Provider, MD  predniSONE (DELTASONE) 5 MG tablet Take 5 mg by mouth daily.   Yes Historical Provider, MD  ticlopidine (TICLID) 250 MG tablet Take 1 tablet (250 mg total) by mouth 2 (two) times daily. 07/14/11  Yes Pricilla Riffle, MD  triazolam (HALCION) 0.25 MG tablet Take 0.25 mg by mouth at bedtime as needed.  For sleep   Yes Historical Provider, MD  traMADol (ULTRAM) 50 MG tablet Take 1-2 tablets (50-100 mg total) by mouth every 6 (six) hours as needed for pain. 03/18/12   [name] Sportsman, MD   Allergies Allergies  Allergen Reactions  . Lantus (Insulin Glargine)   . Metoprolol-Hydrochlorothiazide     REACTION: decreased bp  . Sulfonamide Derivatives Hives  . Titanium     Cervical plate - had to change for stainless steel  . Adhesive (Tape) Hives and Rash    Family History Family History  Problem Relation Age of Onset  . Other Mother 39    Died from sepsis  . Stroke Father 36    died  . Heart disease Father   . Cancer Sister     lung  . Cancer Brother     lung   Social History  reports that she quit smoking about 23 years ago. Her smoking use included Cigarettes. She has a 40 pack-year smoking history. She does not have any smokeless tobacco history on file. She reports that she does not drink alcohol or use illicit drugs.   ROS ROS  The following are not active complaints unless bolded sore throat, dysphagia, dental problems, itching, sneezing,  nasal congestion or excess/ purulent secretions, ear ache,   fever, chills, sweats, unintended wt loss, pleuritic or exertional cp, hemoptysis,  orthopnea pnd or leg swelling, presyncope, palpitations, heartburn, abdominal pain, anorexia, nausea, vomiting, diarrhea  or change in bowel or urinary habits, change in stools or urine, dysuria,hematuria,  rash, arthralgias, visual complaints, headache, numbness weakness or ataxia or problems with walking or coordination,  change in mood/affect or memory.      Current Status: Just arrived ICU cc abd pain, no sob, some congested coughing she attributes to exposure to sulfur containing insulin  Vital Signs: Temp:  [96.6 F (35.9 C)-98.3 F (36.8 C)] 97.5 F (36.4 C) (09/23 1341) Pulse Rate:  [59-82] 59  (09/23 1341) Resp:  [13-20] 13  (09/23 1341) BP: (108-152)/(40-92) 108/43 mmHg (09/23  1341) SpO2:  [92 %-100 %] 94 % (09/23 1341) Arterial Line BP: (164-180)/(44-50) 164/44 mmHg (09/23 1315) Weight:  [60.4 kg (133 lb 2.5 oz)] 60.4 kg (133 lb 2.5 oz) (09/22 1635)  Physical Examination: General:  Obese WF sleepy Neuro:  Intact HEENT:  NO LAN Neck:  No JVD Cardiovascular:  hsr rrr Lungs:  Decreased air movement Abdomen:  Dressing intact Musculoskeletal:  intact Skin:  warm  Principal Problem:  *Colonic stricture Active Problems:  HYPERTENSION  EMPHYSEMA  BOOP (bronchiolitis obliterans with organizing pneumonia)  CONSTIPATION, CHRONIC  Irritable bowel syndrome  Systemic sclerosis  Diverticulosis of colon  Diabetes mellitus Dg Abd Acute W/chest  03/17/2012  *RADIOLOGY REPORT*  Clinical Data: Abdominal pain.  ACUTE ABDOMEN SERIES (ABDOMEN 2 VIEW & CHEST 1 VIEW)  Comparison: No priors.  Findings: Mild diffusely increased interstitial markings are similar to prior examinations.  No acute consolidative airspace disease.  No pleural effusions.  Pulmonary vasculature and the cardiomediastinal silhouette are within normal limits. Atherosclerotic calcifications within the arch of the aorta. Surgical clips on the left side of the thoracic inlet, likely from prior thyroid surgery.  No pneumoperitoneum.  Supine and left lateral decubitus views of the abdomen demonstrate gas and stool scattered throughout the colon extending to the distal rectum.  No pathologic distension of small bowel.  Some small air fluid levels are noted on the lateral decubitus view, which are nonspecific.  Multiple pelvic phleboliths are noted, predominately on the left side of the pelvis.  IMPRESSION: 1.  Nonspecific, nonobstructive bowel gas pattern. 2.  No pneumoperitoneum. 3.  No radiographic evidence of acute cardiopulmonary disease. 4.  Atherosclerosis.   Original Report Authenticated By: Florencia Reasons, M.D.     ASSESSMENT AND PLAN  PULMONARY No results found for this basename:  PHART:5,PCO2:5,PCO2ART:5,PO2ART:5,HCO3:5,O2SAT:5 in the last 168 hours Ventilator Settings:    ETT:  9/23>>9/23  A:  Emphysema with history of boop and chronic steroid use P:   PRN BD  Flutter valve/IS O2 as needed Stress HC  CARDIOVASCULAR No results found for this basename: TROPONINI:5,LATICACIDVEN:5, O2SATVEN:5,PROBNP:5 in the last 168 hours    A: CAD/HTN P:  resume home meds in future  RENAL  Lab 03/17/12 1157 03/13/12 1520  NA 135 137  K 3.1* 3.5  CL 99 99  CO2 25 28  BUN 19 15  CREATININE 0.80 0.71  CALCIUM 8.4 9.0  MG -- --  PHOS -- --   Intake/Output      09/22 0701 - 09/23 0700 09/23 0701 - 09/24 0700   I.V. (mL/kg) 1080 (17.9) 2900 (48)   IV Piggyback  500   Total Intake(mL/kg) 1080 (17.9) 3400 (56.3)   Urine (mL/kg/hr) 400 (0.3) 510 (1.2)   Stool 13    Blood  200   Total Output 413 710   Net +667 +2690        Urine Occurrence 4 x     Foley:  *9/23  A:  Hypokalemia P:   Recheck bmet 9/23  GASTROINTESTINAL  Lab 03/17/12 1157  AST 20  ALT 20  ALKPHOS 75  BILITOT 0.2*  PROT 6.2  ALBUMIN 3.4*    A:  Post Lap P:   Per ccs  HEMATOLOGIC  Lab 03/18/12 1233 03/18/12 0800 03/17/12 1157 03/13/12 1520  HGB 7.4* -- 9.1* 9.9*  HCT 23.8* -- 29.1* 31.4*  PLT -- -- 178 194  INR -- 1.08 -- --  APTT -- -- -- --   A:  Anemia acute blood loss P:  Monitor for blood loss anemia and tx for hgb <7.0  INFECTIOUS  Lab 03/17/12 1157 03/13/12 1520  WBC 7.5 5.0  PROCALCITON -- --   Cultures:  Antibiotics: Cefotatn 9/23 per [facility]  A: No acute issues periop rx per [facility]       ENDOCRINE  Lab 03/18/12 1237 03/18/12 0655 03/18/12 0434 03/18/12 0035 03/17/12 2018  GLUCAP 199* 120* 153* 169* 191*   A: DM  P:   SSI but should avoid any insulin containing sulpha  NEUROLOGIC  A:  Sedated from anethesia P:   Discussed with pt and daughter need to  min sedation/ max mobilization / IS to prevent worsening atx/ post op pna risk  [name] Canales Minor  ACNP [facility] PCCM Pager [phone] till 3 pm If no answer page [phone] 03/18/2012, 2:01 PM  Pt independently  seen and examined and available cxr's reviewed and I agree with above findings/ imp/ plan    Sandrea Hughs, MD Pulmonary and Critical Care Medicine [facility] Cell [phone]

## 2012-03-18 NOTE — Anesthesia Preprocedure Evaluation (Addendum)
Anesthesia Evaluation  Patient identified by MRN, date of birth, ID band Patient awake    Reviewed: Allergy & Precautions, H&P , NPO status , Patient's Chart, lab work & pertinent test results  Airway Mallampati: II TM Distance: >3 FB Neck ROM: Limited  Mouth opening: Limited Mouth Opening  Dental  (+) Partial Upper and Dental Advisory Given   Pulmonary sleep apnea , pneumonia -, COPD BOOP in past breath sounds clear to auscultation  Pulmonary exam normal       Cardiovascular hypertension, Pt. on medications + CAD, + Past MI and + Cardiac Stents Rhythm:Regular Rate:Normal     Neuro/Psych PSYCHIATRIC DISORDERS Depression Legally blind  Neuromuscular disease CVA, Residual Symptoms negative neurological ROS  negative psych ROS   GI/Hepatic Neg liver ROS, GERD-  ,  Endo/Other  diabetes, Type 2, Insulin DependentHypothyroidism   Renal/GU negative Renal ROS  negative genitourinary   Musculoskeletal  (+) Fibromyalgia -  Abdominal   Peds negative pediatric ROS (+)  Hematology negative hematology ROS (+)   Anesthesia Other Findings   Reproductive/Obstetrics negative OB ROS                         Anesthesia Physical Anesthesia Plan  ASA: III  Anesthesia Plan: General   Post-op Pain Management:    Induction: Intravenous  Airway Management Planned: Oral ETT  Additional Equipment:   Intra-op Plan:   Post-operative Plan: Extubation in OR  Informed Consent: I have reviewed the patients History and Physical, chart, labs and discussed the procedure including the risks, benefits and alternatives for the proposed anesthesia with the patient or authorized representative who has indicated his/her understanding and acceptance.   Dental advisory given  Plan Discussed with: CRNA  Anesthesia Plan Comments:         Anesthesia Quick Evaluation

## 2012-03-18 NOTE — Plan of Care (Signed)
Problem: Phase I Progression Outcomes Goal: Vital signs/hemodynamically stable Right Radial A line is significantly higher than cuff BP, by approx 60-70 mmhg.  Hydralyzine 20mg  IV given with minimal response.  Spoke with Dr. Michaell Cowing, he requested that we follow MAPS instead of SBP  And give Hydralyzine prn if MAP is greater than 90.

## 2012-03-18 NOTE — Progress Notes (Signed)
[name] [id number] 02-06-1931   Subjective: Cleared by Pulmonary Admitted for abdominal pain with rectal prep - gone now after loose BMs Pt annoyed did not get a PICC yet Anesthesia at bedside  Objective:  Vital signs:  Filed Vitals:   03/17/12 1601 03/17/12 1635 03/17/12 2229 03/18/12 0617  BP: 112/44 124/58 152/92 131/54  Pulse: 78 78 82 75  Temp: 97.7 F (36.5 C) 97.6 F (36.4 C) 97.8 F (36.6 C) 98.3 F (36.8 C)  TempSrc: Oral Oral Oral Oral  Resp: 15 15 18 16   Height:  5\' 2"  (1.575 m)    Weight:  133 lb 2.5 oz (60.4 kg)    SpO2: 100%  97% 99%    Last BM Date: 03/17/12  Intake/Output   Yesterday:  09/22 0701 - 09/23 0700 In: 1080 [I.V.:1080] Out: 413 [Urine:400; Stool:13] This shift:     Bowel function:  Flatus: y  BM: y  Physical Exam:  General: Pt awake/alert/oriented x4 in no acute distress Eyes: PERRL, normal EOM.  Sclera clear.  No icterus Neuro: CN II-XII intact w/o focal sensory/motor deficits. Lymph: No head/neck/groin lymphadenopathy Psych:  No delerium/psychosis/paranoia.  Nervous but consolable HENT: Normocephalic, Mucus membranes moist.  No thrush Neck: Supple, No tracheal deviation Chest: No chest wall pain w good excursion CV:  Pulses intact.  Regular rhythm Abdomen: Soft.  Nondistended.  Mildly tender at incisions only.  No incarcerated hernias. Ext:  SCDs BLE.  No mjr edema.  No cyanosis Skin: No petechiae / purpurae  Problem List:  Principal Problem:  *Colonic stricture Active Problems:  HYPERTENSION  EMPHYSEMA  BOOP (bronchiolitis obliterans with organizing pneumonia)  CONSTIPATION, CHRONIC  Irritable bowel syndrome  Systemic sclerosis  Diverticulosis of colon  Diabetes mellitus   Assessment  [name]  78 y.o. female  Day of Surgery  Procedure(s): LAPAROSCOPIC PARTIAL COLECTOMY PARTIAL COLECTOMY  Colon stricture with intermittent abd pain  Plan:  Colectomy:  The anatomy & physiology of the  digestive tract was discussed.  The pathophysiology was discussed.  Natural history risks without surgery was discussed.   I feel the risks of no intervention will lead to serious problems that outweigh the operative risks; therefore, I recommended a partial colectomy to remove the pathology.  Laparoscopic & open techniques were discussed.   Risks such as bleeding, infection, abscess, leak, reoperation, possible ostomy, hernia, heart attack, death, and other risks were discussed.  I noted a good likelihood this will help address the problem.   Goals of post-operative recovery were discussed as well.  We will work to minimize complications.  An educational handout on the pathology was given as well.  Questions were answered.  The patient expresses understanding & wishes to proceed with surgery.  -ICU post op w Pulmon CCM consult to help follow -VTE prophylaxis- SCDs, etc -mobilize as tolerated to help recovery  [name] Sportsman, M.D., F.A.C.S. Gastrointestinal and Minimally Invasive Surgery [facility] [address], [address] [place], [place] [address] [phone] Main / Paging [phone] Voice Mail   03/18/2012  CARE TEAM:  PCP: Rama (Georgianne Fick) Sofie Hartigan, MD  Outpatient Care Team: Patient Care Team: Rama (Ajith Nicholos Johns) Sofie Hartigan as PCP - General (Internal Medicine) Mardella Layman, MD as Consulting Physician (Gastroenterology) Coralyn Helling, MD as Consulting Physician (Pulmonary Disease) Pricilla Riffle, MD as Consulting Physician (Cardiology)  Inpatient Treatment Team: Treatment Team: Attending Provider: [name] Sportsman, MD; Technician: Burnard Bunting, [place]; Technician: Calton Golds, [place]; Technician:  Vella Raring, NT   Results:   Labs: Results for orders placed during the hospital encounter of 03/17/12 (from the past 48 hour(s))  CBC WITH DIFFERENTIAL     Status: Abnormal   Collection Time   03/17/12 11:57 AM       Component Value Range Comment   WBC 7.5  4.0 - 10.5 K/uL    RBC 3.87  3.87 - 5.11 MIL/uL    Hemoglobin 9.1 (*) 12.0 - 15.0 g/dL    HCT 16.1 (*) 09.6 - 46.0 %    MCV 75.2 (*) 78.0 - 100.0 fL    MCH 23.5 (*) 26.0 - 34.0 pg    MCHC 31.3  30.0 - 36.0 g/dL    RDW 04.5  40.9 - 81.1 %    Platelets 178  150 - 400 K/uL    Neutrophils Relative 71  43 - 77 %    Neutro Abs 5.4  1.7 - 7.7 K/uL    Lymphocytes Relative 15  12 - 46 %    Lymphs Abs 1.1  0.7 - 4.0 K/uL    Monocytes Relative 12  3 - 12 %    Monocytes Absolute 0.9  0.1 - 1.0 K/uL    Eosinophils Relative 2  0 - 5 %    Eosinophils Absolute 0.1  0.0 - 0.7 K/uL    Basophils Relative 1  0 - 1 %    Basophils Absolute 0.1  0.0 - 0.1 K/uL   COMPREHENSIVE METABOLIC PANEL     Status: Abnormal   Collection Time   03/17/12 11:57 AM      Component Value Range Comment   Sodium 135  135 - 145 mEq/L    Potassium 3.1 (*) 3.5 - 5.1 mEq/L    Chloride 99  96 - 112 mEq/L    CO2 25  19 - 32 mEq/L    Glucose, Bld 142 (*) 70 - 99 mg/dL    BUN 19  6 - 23 mg/dL    Creatinine, Ser 9.14  0.50 - 1.10 mg/dL    Calcium 8.4  8.4 - 78.2 mg/dL    Total Protein 6.2  6.0 - 8.3 g/dL    Albumin 3.4 (*) 3.5 - 5.2 g/dL    AST 20  0 - 37 U/L    ALT 20  0 - 35 U/L    Alkaline Phosphatase 75  39 - 117 U/L    Total Bilirubin 0.2 (*) 0.3 - 1.2 mg/dL    GFR calc non Af Amer 68 (*) >90 mL/min    GFR calc Af Amer 79 (*) >90 mL/min   LIPASE, BLOOD     Status: Normal   Collection Time   03/17/12 11:57 AM      Component Value Range Comment   Lipase 45  11 - 59 U/L   GLUCOSE, CAPILLARY     Status: Abnormal   Collection Time   03/17/12  5:26 PM      Component Value Range Comment   Glucose-Capillary 123 (*) 70 - 99 mg/dL   GLUCOSE, CAPILLARY     Status: Abnormal   Collection Time   03/17/12  8:18 PM      Component Value Range Comment   Glucose-Capillary 191 (*) 70 - 99 mg/dL    Comment 1 Documented in Chart      Comment 2 Notify RN     GLUCOSE, CAPILLARY     Status:  Abnormal   Collection Time   03/18/12 12:35 AM  Component Value Range Comment   Glucose-Capillary 169 (*) 70 - 99 mg/dL   GLUCOSE, CAPILLARY     Status: Abnormal   Collection Time   03/18/12  4:34 AM      Component Value Range Comment   Glucose-Capillary 153 (*) 70 - 99 mg/dL    Comment 1 Notify RN     GLUCOSE, CAPILLARY     Status: Abnormal   Collection Time   03/18/12  6:55 AM      Component Value Range Comment   Glucose-Capillary 120 (*) 70 - 99 mg/dL     Imaging / Studies: Dg Abd Acute W/chest  03/17/2012  *RADIOLOGY REPORT*  Clinical Data: Abdominal pain.  ACUTE ABDOMEN SERIES (ABDOMEN 2 VIEW & CHEST 1 VIEW)  Comparison: No priors.  Findings: Mild diffusely increased interstitial markings are similar to prior examinations.  No acute consolidative airspace disease.  No pleural effusions.  Pulmonary vasculature and the cardiomediastinal silhouette are within normal limits. Atherosclerotic calcifications within the arch of the aorta. Surgical clips on the left side of the thoracic inlet, likely from prior thyroid surgery.  No pneumoperitoneum.  Supine and left lateral decubitus views of the abdomen demonstrate gas and stool scattered throughout the colon extending to the distal rectum.  No pathologic distension of small bowel.  Some small air fluid levels are noted on the lateral decubitus view, which are nonspecific.  Multiple pelvic phleboliths are noted, predominately on the left side of the pelvis.  IMPRESSION: 1.  Nonspecific, nonobstructive bowel gas pattern. 2.  No pneumoperitoneum. 3.  No radiographic evidence of acute cardiopulmonary disease. 4.  Atherosclerosis.   Original Report Authenticated By: Florencia Reasons, M.D.     Medications / Allergies: per chart  Antibiotics: Anti-infectives     Start     Dose/Rate Route Frequency Ordered Stop   03/18/12 0715   clindamycin (CLEOCIN) 900 mg, gentamicin (GARAMYCIN) 240 mg in sodium chloride 0.9 % 1,000 mL for intraperitoneal  lavage         Irrigation To Surgery 03/18/12 0707 03/19/12 0715   03/18/12 0454   cefoTEtan (CEFOTAN) 2 g in dextrose 5 % 50 mL IVPB     Comments: Pharmacy may adjust dose strength for optimal dosing      2 g 100 mL/hr over 30 Minutes Intravenous On call to O.R. 03/18/12 1191 03/19/12 0559

## 2012-03-18 NOTE — Progress Notes (Signed)
eLink Physician-Brief Progress Note Patient Name: [name] DOB: 17-Sep-1930 MRN: [id number]  Date of Service  03/18/2012   HPI/Events of Note  Low k, ca even with correction  eICU Interventions  PIV, replace IV as post op   Intervention Category Major Interventions: Electrolyte abnormality - evaluation and management  Nelda Bucks. 03/18/2012, 5:39 PM

## 2012-03-18 NOTE — Anesthesia Postprocedure Evaluation (Signed)
Anesthesia Post Note  Patient: [name]  Procedure(s) Performed: Procedure(s) (LRB): LAPAROSCOPIC PARTIAL COLECTOMY (N/A) PROCTOSCOPY (N/A) LAPAROSCOPIC LYSIS OF ADHESIONS (N/A)  Anesthesia type: General  Patient location: PACU  Post pain: Pain level controlled  Post assessment: Post-op Vital signs reviewed  Last Vitals:  Filed Vitals:   03/18/12 1300  BP: 114/45  Pulse: 63  Temp:   Resp:     Post vital signs: Reviewed  Level of consciousness: sedated  Complications: No apparent anesthesia complications

## 2012-03-18 NOTE — Op Note (Signed)
[name], [name] NO.:  [id number]  MEDICAL RECORD NO.:  [id number]  LOCATION:  [facility]                         FACILITY:  [facility]  PHYSICIAN:  [name] Sportsman, MD     DATE OF BIRTH:  1930-08-09  DATE OF PROCEDURE:  03/18/2012 DATE OF DISCHARGE:                              OPERATIVE REPORT   PRIMARY CARE PHYSICIAN: [name])   GASTROENTEROLOGIST:  Vania Rea. Jarold Motto, MD, Caleen Essex, FAGA  PULMONOLOGIST:  Coralyn Helling, MD  CARDIOLOGIST:  Pricilla Riffle, MD, [facility]  SURGEON:  [name] Sportsman, MD, FACS  ASSISTANT:  Axel Filler, MD  PREOPERATIVE DIAGNOSIS:  Intermittent abdominal pain with probable sigmoid colonic stricture.  POSTOPERATIVE DIAGNOSIS:  Intermittent abdominal pain with probable sigmoid colonic stricture with adhesions.  PROCEDURES PERFORMED: 1. Laparoscopic lysis of adhesions time 60 minutes (equals 1/3 of the     case). 2. Laparoscopically-assisted rectosigmoid resection (LAR). 3. Hand-sewn descending colon to rectal anastomosis. 4. Rigid proctoscopy.  SPECIMEN:  Rectosigmoid colon.  Silk stitch at distal end.  DRAINS:  None.  ESTIMATED BLOOD LOSS:  150 mL.  COMPLICATIONS:  None major, although hemodynamically labile. Perioperatively, see anesthesia note.  INDICATIONS:  Ms.  [name] is a 78 year old female with significant pulmonary issues as well as a stable coronary disease.  She has had intermittent abdominal pains and probable history of diverticulitis in distant past.  She was found to have a strictured area in her left colon with persistent symptoms despite a bowel regimen.  After numerous discussions and re-consultation, she agreed to consider surgery to resect the problem area out.  Technique, risks, benefits, and alternatives were discussed in detail.  She had medical coronary cardiology and pulmonary clearance.  She did struggle with some pneumonias, which delayed the surgery this year until  now. Postoperative ICU stay.  Risks, benefits, and alternatives including stroke, heart attack, death, leak, colostomy etc. were discussed in detail.  All questions answered, and she and her family agreed to proceed.  OPERATIVE FINDINGS:  She had some moderate adhesions from open cholecystectomy as well as her prior hysterectomy and oophorectomy, but they were not massive.  She had a very tortuous sigmoid colon, corkscrewed upon itself.  Some fibrosis, but no clear cancerous mass or hard stricture.  The anastomosis is a hand-sewn 3-0 Vicryl anastomosis without any staples.  At rest, 13 cm from the anal verge at the recto-colon junction Distal to the sacral promontory.  DESCRIPTION OF PROCEDURE:  Informed consent was confirmed.  The patient was given a general rectal bowel prep, had abdominal pain, came to the emergency room, was admitted last night but was stabilized.  She underwent general anesthesia without any difficulty.  She was positioned in low lithotomy with arms tucked.  Her abdomen and perineum were prepped and draped in a sterile fashion.  Surgical time-out confirmed our plan.  I placed a 5-mm port in the left upper quadrant using optical entry technique with the patient steep reverse Trendelenburg and left side up. Entry was clean.  Adhesions were noted in the right upper quadrant and pelvis, but not masses.  Under direct visualization, I placed 5-mm ports in the right upper quadrant through  the umbilicus in the right mid abdomen and right lower quadrant.  I freed off the greater omental attachments to the lower abdomen and pelvic brim.  I also freed off the small bowel.  I freed the adhesions off the right upper quadrant to take the liver and omental adhesions down to allow the greater omentum and part of the transverse colon to lay above the liver.  I gave enough room for the small bowel to help fall better out of the pelvis.  There were some moderate adhesions  of small bowel down to the deep pelvis and pubic rim along the rectosigmoid mesentery.  Gradually, I freed those off with cold scissors and was able to reduce that out.  I could see the sigmoid colon was somewhat tortuous and in the rectosigmoid area was corkscrewed upon itself down the deeper pelvis.  I did score the mesentery at the level of the IMA/IMV pedicle near the ligament of Treitz down towards the mid rectum.  I elevated the sigmoid mesentery anteriorly.  I could see the retroperitoneal structures. Initially, we could not see the left ureter, but found it more retroperitoneal and kept that posterior as possible.  She had somewhat of a foreshortened inferior mesenteric artery and left colic pedicle, contracted, which made mediolateral elevation a little challenging.  In doing that, I did have to tear branch off the inferior mesenteric vein.  I saw that, and I isolated that.  I confirmed ureter Location away and then controlled the branch bleeder with bipolar EnSeal cautery system.  We switched over to a lateral medial to get better mobilization.  I freed the proximal sigmoid colon and descending colon in a lateral medial fashion after attachments up to the splenic flexure.  Began to mobilize and come down near the adhesions of the sigmoid colon and down to the mid rectum on the left side.  I was able to enter into that retromesenteric plane.  I freed the mesorectum more distally off the sacrum to get some rectal mobilization.  I went ahead and placed a GelPort through her prior Pfannenstiel incision through a 7cm transverse incision.  With that, I could help eviscerate some of the rectosigmoid colon.  I freed off adhesions between the sigmoid colon as it was a corkscrewed and folded upon itself.  With better straightening in and out, I could get to the proximal rectum.  It did have some inflammatory changes as well, so I did end up going down a little more distal towards more  the mid rectum.   I did end up straightening out the mesentery.  I could see the right and left ureters and kept those posteriorly. The mid descending colon was able to reach down to the sacral promontory quite well.  I ended up taking the mesentery radially with clamps and ties and bipolar system staying away from the ureters.  I ended up taking the junction of the proximal and mid rectum preserving much of the mesorectum.  I was actually able to preserve the superior hemorrhoidal artery and vein pedicles for good blood supply to the rectal stump.  I used soft bowel clamps of the rectal stump at the peritoneal reflection and transected the rectum off at the mid/proximal junction with a scalpel.  I got good healthy bleeding mucosa.  I followed more proximally and freed off at the descending colon in a similar fashion.  I did an end-to-end hand-sewn anastomosis using interrupted 3-0 Vicryl stitches.  They were full-thickness interrupted stitches.  I initially did the back wall and then came around the anterior wall.  Because it came together well, I  decided not to do two layers.  I decided to avoid any permanent stitch.  I scrubbed down and did rigid proctoscopy.  Digital rectal examination  noted no abnormalities except some mild hemorrhoidal pile thickening, but no major hemorrhoids.  I was able to advance the proctoscope easily up across the anastomosis.  I could get it across quite well.  The anastomosis was at 13 cm from the anal verge.  I was able to insufflate the colorectal anastomosis under water with no leak of air.  This was a negative air leak test. The left colon was now straight without any tortuosity.  I re-scrubbed back in and Dr. Derrell Lolling did 1 liter antibiotic  irrigation (clindamycin and gentamicin).  We then went back laparoscopically and did laparoscopic inspection.  Aspirated the antibiotic irrigation and noted hemostasis was excellent.  Now, I saw no bleeding on  the adhesions in the abdomen in the right upper quadrant or other quadrants nor down the pelvis.  Ureters, I could see and they were preserved.    Just prior to closure, we placed the On-Q catheters in the preperitoneal plane through some supraumbilical puncture sites and followed those down towards the lateral flanks.  I evacuated carbon dioxide and removed the ports.  I closed the port sites using 4-0 Monocryl stitch.  I closed the Pfannenstiel incision using 0 Vicryl stitch to help bring the posterior rectus fascia and rectus muscle to the midline.  I then closed the anterior rectus fascia using #1 PDS running.  I closed the skin with some interrupted running 4-0 Monocryl stitches and placed antibiotic-soaked wicks, Betadine-soaked wicks in the wound.  Sterile dressings were applied.  While she did have some hemodynamic lability with some hyper/hypotension in the middle of the case, but was much stable near the end.  She was extubated to recovery room.  I have discussed postoperative findings with the patient's family.     [name] Sportsman, MD     SCG/MEDQ  D:  03/18/2012  T:  03/18/2012  Job:  [id number]  cc:   Vania Rea. Jarold Motto, MD, FACG, FACP, FAGA [address] [place] [address]  Coralyn Helling, MD  Pricilla Riffle, MD, [facility] [address]  [address] [place] [address]

## 2012-03-18 NOTE — Progress Notes (Signed)
Asked by PACU nurse to examine hand with indwelling arterial line. Finger tips appear pale and cold to touch. On exam,  capillary refill 2-3 seconds with compression of finger tips. Due to being difficult IV stick, will elect to leave A-line for future blood draws. Lucille Passy MD

## 2012-03-18 NOTE — Progress Notes (Signed)
Dr Rica Mast aware  Post op H and H 7.4/23.8.  BP  114/45.  Hr 60's  sats 92 on 2L nasal canula.  Ready for dishcarge to ICU-No new orders.

## 2012-03-18 NOTE — Brief Op Note (Addendum)
03/17/2012 - 03/18/2012  12:03 PM  PATIENT:  [name]  78 y.o. female  Patient Care Team: Rama (Georgianne Fick) Sofie Hartigan as PCP - General (Internal Medicine) Mardella Layman, MD as Consulting Physician (Gastroenterology) Coralyn Helling, MD as Consulting Physician (Pulmonary Disease) Pricilla Riffle, MD as Consulting Physician (Cardiology)    PRE-OPERATIVE DIAGNOSIS:  colon stricture   POST-OPERATIVE DIAGNOSIS:  Rectosigmoid colon tortuosity from adhesions & probable stricture  PROCEDURE:  Procedure(s) (LRB) with comments: LAPAROSCOPIC Rectosigmoid resection (Low Anterior) PROCTOSCOPY (N/A) - rigid procto LAPAROSCOPIC LYSIS OF ADHESIONS (N/A) - x60 mins  SURGEON:  Surgeon(s) and Role:    * [name] Sportsman, MD - Primary    * Axel Filler, MD - Assisting  ANESTHESIA:   local and general  EBL:  Total I/O In: 2000 [I.V.:2000] Out: 650 [Urine:450; Blood:200]  BLOOD ADMINISTERED:none  DRAINS: none   LOCAL MEDICATIONS USED:  BUPIVICAINE   SPECIMEN:  Source of Specimen:  Rectosigmoid colon - stitch at rectal end  DISPOSITION OF SPECIMEN:  PATHOLOGY  COUNTS:  YES  TOURNIQUET:  * No tourniquets in log *  DICTATION: .Other Dictation: Dictation Number [id number]  PLAN OF CARE: Admit to inpatient   PATIENT DISPOSITION:  PACU - hemodynamically stable.   Delay start of Pharmacological VTE agent (>24hrs) due to surgical blood loss or risk of bleeding: no

## 2012-03-18 NOTE — Transfer of Care (Signed)
Immediate Anesthesia Transfer of Care Note  Patient: KIMORAH RIDOLFI  Procedure(s) Performed: Procedure(s) (LRB) with comments: LAPAROSCOPIC PARTIAL COLECTOMY (N/A) - Sigmoid colectomy PROCTOSCOPY (N/A) - rigid procto LAPAROSCOPIC LYSIS OF ADHESIONS (N/A) - x60 mins  Patient Location: PACU  Anesthesia Type: General  Level of Consciousness: sedated  Airway & Oxygen Therapy: Patient Spontanous Breathing and Patient connected to face mask oxygen  Post-op Assessment: Report given to PACU RN  Post vital signs: Reviewed and stable  Complications: No apparent anesthesia complications

## 2012-03-19 ENCOUNTER — Inpatient Hospital Stay (HOSPITAL_COMMUNITY): Payer: Medicare Other

## 2012-03-19 ENCOUNTER — Encounter (HOSPITAL_COMMUNITY): Payer: Self-pay | Admitting: Surgery

## 2012-03-19 DIAGNOSIS — J95822 Acute and chronic postprocedural respiratory failure: Secondary | ICD-10-CM | POA: Diagnosis present

## 2012-03-19 DIAGNOSIS — D638 Anemia in other chronic diseases classified elsewhere: Secondary | ICD-10-CM | POA: Diagnosis present

## 2012-03-19 DIAGNOSIS — J8410 Pulmonary fibrosis, unspecified: Secondary | ICD-10-CM | POA: Diagnosis present

## 2012-03-19 DIAGNOSIS — J9601 Acute respiratory failure with hypoxia: Secondary | ICD-10-CM | POA: Diagnosis present

## 2012-03-19 LAB — POCT I-STAT 4, (NA,K, GLUC, HGB,HCT)
Glucose, Bld: 197 mg/dL — ABNORMAL HIGH (ref 70–99)
HCT: 25 % — ABNORMAL LOW (ref 36.0–46.0)
Hemoglobin: 8.5 g/dL — ABNORMAL LOW (ref 12.0–15.0)
Potassium: 3.9 mEq/L (ref 3.5–5.1)
Sodium: 140 mEq/L (ref 135–145)

## 2012-03-19 LAB — CBC
HCT: 23.7 % — ABNORMAL LOW (ref 36.0–46.0)
Hemoglobin: 7.3 g/dL — ABNORMAL LOW (ref 12.0–15.0)
MCH: 23.8 pg — ABNORMAL LOW (ref 26.0–34.0)
MCHC: 30.8 g/dL (ref 30.0–36.0)
MCV: 77.2 fL — ABNORMAL LOW (ref 78.0–100.0)
Platelets: 160 10*3/uL (ref 150–400)
RBC: 3.07 MIL/uL — ABNORMAL LOW (ref 3.87–5.11)
RDW: 16.1 % — ABNORMAL HIGH (ref 11.5–15.5)
WBC: 7.2 10*3/uL (ref 4.0–10.5)

## 2012-03-19 LAB — COMPREHENSIVE METABOLIC PANEL WITH GFR
ALT: 40 U/L — ABNORMAL HIGH (ref 0–35)
AST: 31 U/L (ref 0–37)
Albumin: 2.2 g/dL — ABNORMAL LOW (ref 3.5–5.2)
Alkaline Phosphatase: 55 U/L (ref 39–117)
BUN: 8 mg/dL (ref 6–23)
CO2: 25 meq/L (ref 19–32)
Calcium: 7.4 mg/dL — ABNORMAL LOW (ref 8.4–10.5)
Chloride: 106 meq/L (ref 96–112)
Creatinine, Ser: 0.75 mg/dL (ref 0.50–1.10)
GFR calc Af Amer: >90 mL/min
GFR calc non Af Amer: 78 mL/min — ABNORMAL LOW
Glucose, Bld: 127 mg/dL — ABNORMAL HIGH (ref 70–99)
Potassium: 4.9 meq/L (ref 3.5–5.1)
Sodium: 136 meq/L (ref 135–145)
Total Bilirubin: 0.2 mg/dL — ABNORMAL LOW (ref 0.3–1.2)
Total Protein: 4.4 g/dL — ABNORMAL LOW (ref 6.0–8.3)

## 2012-03-19 LAB — GLUCOSE, CAPILLARY
Glucose-Capillary: 141 mg/dL — ABNORMAL HIGH (ref 70–99)
Glucose-Capillary: 77 mg/dL (ref 70–99)
Glucose-Capillary: 87 mg/dL (ref 70–99)
Glucose-Capillary: 143 mg/dL — ABNORMAL HIGH (ref 70–99)

## 2012-03-19 LAB — HEMOGLOBIN A1C
Hgb A1c MFr Bld: 7.2 % — ABNORMAL HIGH (ref <5.7)
Mean Plasma Glucose: 160 mg/dL — ABNORMAL HIGH (ref <117)

## 2012-03-19 MED ORDER — LACTATED RINGERS IV SOLN
INTRAVENOUS | Status: DC
  Administered 2012-03-19: 23:00:00 via INTRAVENOUS

## 2012-03-19 MED ORDER — HYDROMORPHONE HCL PF 1 MG/ML IJ SOLN
0.5000 mg | INTRAMUSCULAR | Status: DC | PRN
  Administered 2012-03-20: 0.5 mg via INTRAVENOUS
  Filled 2012-03-19: qty 1

## 2012-03-19 MED ORDER — HYDROCORTISONE SOD SUCCINATE 100 MG IJ SOLR
25.0000 mg | Freq: Two times a day (BID) | INTRAMUSCULAR | Status: DC
  Administered 2012-03-19 – 2012-03-20 (×2): 25 mg via INTRAVENOUS
  Filled 2012-03-19 (×5): qty 0.5

## 2012-03-19 MED FILL — Bupivacaine Inj 0.25% w/ Epinephrine 1:200000 (PF): INTRAMUSCULAR | Qty: 30 | Status: AC

## 2012-03-19 NOTE — Progress Notes (Signed)
[name] [id number] 22-Dec-1930   Subjective:  Smiling Pain controlled  No nausea/vomiting Gave me a hug "You remind me of my grandson!" "Do you like him?" "Well, no.  Not really..."   Objective:  Vital signs:  Filed Vitals:   03/19/12 0300 03/19/12 0400 03/19/12 0500 03/19/12 0600  BP: 108/42 99/34 94/31  116/36  Pulse: 95 95 97 96  Temp:  98.4 F (36.9 C)    TempSrc:  Oral    Resp: 12 13 14 13   Height:      Weight:      SpO2: 94% 95% 96% 95%    Last BM Date: 03/17/12  Intake/Output   Yesterday:  09/23 0701 - 09/24 0700 In: 5025 [P.O.:440; I.V.:3725; IV Piggyback:860] Out: 1375 [Urine:1175; Blood:200] This shift:  Total I/O In: 1200 [P.O.:340; I.V.:600; IV Piggyback:260] Out: 495 [Urine:495]  Bowel function:  Flatus: ?Y  BM: n  Physical Exam:  General: Pt awake/alert/oriented x4 in no acute distress Eyes: PERRL, normal EOM.  Sclera clear.  No icterus Neuro: CN II-XII intact w/o focal sensory/motor deficits. Lymph: No head/neck/groin lymphadenopathy Psych:  No delerium/psychosis/paranoia HENT: Normocephalic, Mucus membranes moist.  No thrush Neck: Supple, No tracheal deviation Chest: No chest wall pain w good excursion.  Dec BS at base CV:  Pulses intact.  Regular rhythm Abdomen: Soft.  Nondistended.  Mildly tender at incisions only.  No incarcerated hernias.  Dressings clean Ext:  SCDs BLE.  No mjr edema.  No cyanosis Skin: No petechiae / purpurae  Problem List:  Principal Problem:  *Colonic stricture Active Problems:  HYPERTENSION  EMPHYSEMA  BOOP (bronchiolitis obliterans with organizing pneumonia)  CONSTIPATION, CHRONIC  Irritable bowel syndrome  Systemic sclerosis  Diverticulosis of colon  Diabetes mellitus  Acute respiratory failure   Assessment  [name]  78 y.o. female  1 Day Post-Op  Procedure(s): LAPAROSCOPIC PARTIAL COLECTOMY PROCTOSCOPY LAPAROSCOPIC LYSIS OF ADHESIONS  Stabilizing  Plan:  Transfer to  floor  -anemia - follow. Transfuse only if <7 or worsening hypoxia.  Hold on Ticlid until Hgb stable x2 days -PICC per pt request -d/c A line -steroid taper for pulmon issues.  Pulmonary help appreciated -try to keep dry. -f/u path -HTN control w oral amlodipine, PRN hydralazine -VTE prophylaxis- SCDs, etc -mobilize as tolerated to help recovery.  PT/OT evals -  [name] Sportsman, M.D., F.A.C.S. Gastrointestinal and Minimally Invasive Surgery [facility] [address] [place], [place] [address] [phone] Main / Paging [phone] Voice Mail   03/19/2012  CARE TEAM:  PCP: Rama (Renee Freud, MD  Outpatient Care Team: Patient Care Team: Rama ([name]) Sofie Hartigan as PCP - General (Internal Medicine) Mardella Layman, MD as Consulting Physician (Gastroenterology) Coralyn Helling, MD as Consulting Physician (Pulmonary Disease) Pricilla Riffle, MD as Consulting Physician (Cardiology)  Inpatient Treatment Team: Treatment Team: Attending Provider: [name] Sportsman, MD; Technician: Burnard Bunting, [place]; Technician: Calton Golds, [place]; Consulting Physician: Coralyn Helling, MD; Consulting Physician: Nyoka Cowden, MD; Rounding Team: Md Pccm, MD; Registered Nurse: Gita Kudo Main, RN; Registered Nurse: Gerald Stabs, RN   Results:   Labs: Results for orders placed during the hospital encounter of 03/17/12 (from the past 48 hour(s))  CBC WITH DIFFERENTIAL     Status: Abnormal   Collection Time   03/17/12 11:57 AM      Component Value Range Comment   WBC 7.5  4.0 - 10.5 K/uL    RBC 3.87  3.87 - 5.11 MIL/uL    Hemoglobin 9.1 (*) 12.0 - 15.0 g/dL    HCT 45.4 (*) 09.8 - 46.0 %    MCV 75.2 (*) 78.0 - 100.0 fL    MCH 23.5 (*) 26.0 - 34.0 pg    MCHC 31.3  30.0 - 36.0 g/dL    RDW 11.9  14.7 - 82.9 %    Platelets 178  150 - 400 K/uL    Neutrophils Relative 71  43 - 77 %    Neutro Abs 5.4  1.7 - 7.7 K/uL     Lymphocytes Relative 15  12 - 46 %    Lymphs Abs 1.1  0.7 - 4.0 K/uL    Monocytes Relative 12  3 - 12 %    Monocytes Absolute 0.9  0.1 - 1.0 K/uL    Eosinophils Relative 2  0 - 5 %    Eosinophils Absolute 0.1  0.0 - 0.7 K/uL    Basophils Relative 1  0 - 1 %    Basophils Absolute 0.1  0.0 - 0.1 K/uL   COMPREHENSIVE METABOLIC PANEL     Status: Abnormal   Collection Time   03/17/12 11:57 AM      Component Value Range Comment   Sodium 135  135 - 145 mEq/L    Potassium 3.1 (*) 3.5 - 5.1 mEq/L    Chloride 99  96 - 112 mEq/L    CO2 25  19 - 32 mEq/L    Glucose, Bld 142 (*) 70 - 99 mg/dL    BUN 19  6 - 23 mg/dL    Creatinine, Ser 5.62  0.50 - 1.10 mg/dL    Calcium 8.4  8.4 - 13.0 mg/dL    Total Protein 6.2  6.0 - 8.3 g/dL    Albumin 3.4 (*) 3.5 - 5.2 g/dL    AST 20  0 - 37 U/L    ALT 20  0 - 35 U/L    Alkaline Phosphatase 75  39 - 117 U/L    Total Bilirubin 0.2 (*) 0.3 - 1.2 mg/dL    GFR calc non Af Amer 68 (*) >90 mL/min    GFR calc Af Amer 79 (*) >90 mL/min   LIPASE, BLOOD     Status: Normal   Collection Time   03/17/12 11:57 AM      Component Value Range Comment   Lipase 45  11 - 59 U/L   GLUCOSE, CAPILLARY     Status: Abnormal   Collection Time   03/17/12  5:26 PM      Component Value Range Comment   Glucose-Capillary 123 (*) 70 - 99 mg/dL   GLUCOSE, CAPILLARY     Status: Abnormal   Collection Time   03/17/12  8:18 PM      Component Value Range Comment   Glucose-Capillary 191 (*) 70 - 99 mg/dL    Comment 1 Documented in Chart      Comment 2 Notify RN     GLUCOSE, CAPILLARY     Status: Abnormal   Collection Time   03/18/12 12:35 AM      Component Value Range Comment   Glucose-Capillary 169 (*) 70 - 99 mg/dL   GLUCOSE, CAPILLARY     Status: Abnormal   Collection Time   03/18/12  4:34 AM      Component Value Range Comment   Glucose-Capillary 153 (*) 70 - 99 mg/dL    Comment 1 Notify RN     GLUCOSE,  CAPILLARY     Status: Abnormal   Collection Time   03/18/12  6:55 AM       Component Value Range Comment   Glucose-Capillary 120 (*) 70 - 99 mg/dL   PROTIME-INR     Status: Normal   Collection Time   03/18/12  8:00 AM      Component Value Range Comment   Prothrombin Time 13.9  11.6 - 15.2 seconds    INR 1.08  0.00 - 1.49   HEMOGLOBIN AND HEMATOCRIT, BLOOD     Status: Abnormal   Collection Time   03/18/12 12:33 PM      Component Value Range Comment   Hemoglobin 7.4 (*) 12.0 - 15.0 g/dL    HCT 40.9 (*) 81.1 - 46.0 %   GLUCOSE, CAPILLARY     Status: Abnormal   Collection Time   03/18/12 12:37 PM      Component Value Range Comment   Glucose-Capillary 199 (*) 70 - 99 mg/dL   BASIC METABOLIC PANEL     Status: Abnormal   Collection Time   03/18/12  4:00 PM      Component Value Range Comment   Sodium 142  135 - 145 mEq/L DELTA CHECK NOTED   Potassium 2.7 (*) 3.5 - 5.1 mEq/L    Chloride 119 (*) 96 - 112 mEq/L DELTA CHECK NOTED   CO2 16 (*) 19 - 32 mEq/L    Glucose, Bld 151 (*) 70 - 99 mg/dL    BUN 6  6 - 23 mg/dL DELTA CHECK NOTED   Creatinine, Ser 0.46 (*) 0.50 - 1.10 mg/dL DELTA CHECK NOTED   Calcium 5.3 (*) 8.4 - 10.5 mg/dL    GFR calc non Af Amer >90  >90 mL/min    GFR calc Af Amer >90  >90 mL/min   GLUCOSE, CAPILLARY     Status: Abnormal   Collection Time   03/18/12  5:36 PM      Component Value Range Comment   Glucose-Capillary 149 (*) 70 - 99 mg/dL   GLUCOSE, CAPILLARY     Status: Abnormal   Collection Time   03/18/12  9:34 PM      Component Value Range Comment   Glucose-Capillary 151 (*) 70 - 99 mg/dL    Comment 1 Notify RN     CBC     Status: Abnormal   Collection Time   03/19/12  3:22 AM      Component Value Range Comment   WBC 7.2  4.0 - 10.5 K/uL    RBC 3.07 (*) 3.87 - 5.11 MIL/uL    Hemoglobin 7.3 (*) 12.0 - 15.0 g/dL    HCT 91.4 (*) 78.2 - 46.0 %    MCV 77.2 (*) 78.0 - 100.0 fL    MCH 23.8 (*) 26.0 - 34.0 pg    MCHC 30.8  30.0 - 36.0 g/dL    RDW 95.6 (*) 21.3 - 15.5 %    Platelets 160  150 - 400 K/uL   COMPREHENSIVE METABOLIC PANEL      Status: Abnormal   Collection Time   03/19/12  3:22 AM      Component Value Range Comment   Sodium 136  135 - 145 mEq/L    Potassium 4.9  3.5 - 5.1 mEq/L    Chloride 106  96 - 112 mEq/L    CO2 25  19 - 32 mEq/L    Glucose, Bld 127 (*) 70 - 99 mg/dL    BUN 8  6 - 23 mg/dL    Creatinine, Ser 1.61  0.50 - 1.10 mg/dL    Calcium 7.4 (*) 8.4 - 10.5 mg/dL    Total Protein 4.4 (*) 6.0 - 8.3 g/dL    Albumin 2.2 (*) 3.5 - 5.2 g/dL    AST 31  0 - 37 U/L    ALT 40 (*) 0 - 35 U/L    Alkaline Phosphatase 55  39 - 117 U/L    Total Bilirubin 0.2 (*) 0.3 - 1.2 mg/dL    GFR calc non Af Amer 78 (*) >90 mL/min    GFR calc Af Amer >90  >90 mL/min     Imaging / Studies: Dg Chest Port 1 View  03/19/2012  *RADIOLOGY REPORT*  Clinical Data: Atelectasis.  PORTABLE CHEST - 1 VIEW  Comparison: 03/13/2012.  Findings: Worsening pulmonary aeration compared yesterday's exam. There is no interstitial prominence, which at the bases appears representing combination of atelectasis and probable interstitial pulmonary edema.  Lung volumes are lower than on prior.  There may be a small left pleural effusion.  Cardiopericardial silhouette is unchanged. Monitoring leads are projected over the chest.  IMPRESSION: Worsening pulmonary aeration, predominately due to atelectasis. There may be some interstitial pulmonary edema as well.  Probable small left pleural effusion.   Original Report Authenticated By: Andreas Newport, M.D.    Dg Abd Acute W/chest  03/17/2012  *RADIOLOGY REPORT*  Clinical Data: Abdominal pain.  ACUTE ABDOMEN SERIES (ABDOMEN 2 VIEW & CHEST 1 VIEW)  Comparison: No priors.  Findings: Mild diffusely increased interstitial markings are similar to prior examinations.  No acute consolidative airspace disease.  No pleural effusions.  Pulmonary vasculature and the cardiomediastinal silhouette are within normal limits. Atherosclerotic calcifications within the arch of the aorta. Surgical clips on the left side of the  thoracic inlet, likely from prior thyroid surgery.  No pneumoperitoneum.  Supine and left lateral decubitus views of the abdomen demonstrate gas and stool scattered throughout the colon extending to the distal rectum.  No pathologic distension of small bowel.  Some small air fluid levels are noted on the lateral decubitus view, which are nonspecific.  Multiple pelvic phleboliths are noted, predominately on the left side of the pelvis.  IMPRESSION: 1.  Nonspecific, nonobstructive bowel gas pattern. 2.  No pneumoperitoneum. 3.  No radiographic evidence of acute cardiopulmonary disease. 4.  Atherosclerosis.   Original Report Authenticated By: Florencia Reasons, M.D.     Medications / Allergies: per chart  Antibiotics: Anti-infectives     Start     Dose/Rate Route Frequency Ordered Stop   03/18/12 1500   cefoTEtan (CEFOTAN) 1 g in dextrose 5 % 50 mL IVPB     Comments: Pharmacy may adjust dose strength for optimal dosing      1 g 100 mL/hr over 30 Minutes Intravenous Every 12 hours 03/18/12 1427 03/18/12 1606   03/18/12 0715   clindamycin (CLEOCIN) 900 mg, gentamicin (GARAMYCIN) 240 mg in sodium chloride 0.9 % 1,000 mL for intraperitoneal lavage         Irrigation To Surgery 03/18/12 0707 03/19/12 0715   03/18/12 0454   cefoTEtan (CEFOTAN) 2 g in dextrose 5 % 50 mL IVPB     Comments: Pharmacy may adjust dose strength for optimal dosing      2 g 100 mL/hr over 30 Minutes Intravenous On call to O.R. 03/18/12 0960 03/18/12 0745

## 2012-03-19 NOTE — Evaluation (Signed)
Physical Therapy Evaluation Patient Details Name: [name] MRN: [id number] DOB: 02/07/1931 Today's Date: 03/19/2012 Time: 7829-5621 PT Time Calculation (min): 28 min  PT Assessment / Plan / Recommendation Clinical Impression  Pt. was admitted w/ severe abdominal pain. Was planning elective colectomy prior to urgent admission. Pt. is s/p expl. lap w/ sigmoidectomy and lysis of adhesions. Pt. is legally blind but is Independent w/ all ADL's and cares for 2 small children. Pt. will benefit from PT to return to modified functional independence. Pt. states she will consider a sh0rt SNF stay if necessary.    PT Assessment  Patient needs continued PT services    Follow Up Recommendations  Home health PT;Skilled nursing facility (will see how pt progresses)    Barriers to Discharge Decreased caregiver support      Equipment Recommendations  None recommended by PT    Recommendations for Other Services OT consult   Frequency Min 3X/week    Precautions / Restrictions Precautions Precaution Comments: abd. surgical site, decreased sats Restrictions Weight Bearing Restrictions: No   Pertinent Vitals/Pain 6/10 abdomen, RN notified and brought meds.      Mobility  Bed Mobility Bed Mobility: Rolling Left;Left Sidelying to Sit Rolling Left: With rail;5: Supervision Left Sidelying to Sit: 4: Min assist;With rails Details for Bed Mobility Assistance: VC to protect abdomen by rolling. Transfers Transfers: Sit to Stand;Stand to Sit Sit to Stand: 4: Min guard;From bed Stand to Sit: To chair/3-in-1;4: Min guard;With armrests Details for Transfer Assistance: VC for use of UE's  Ambulation/Gait Ambulation/Gait Assistance: 1: +2 Total assist (+2 for safety.) Ambulation/Gait: Patient Percentage: 80% Ambulation Distance (Feet): 50 Feet Assistive device: Rolling walker Ambulation/Gait Assistance Details: Pt was able to push RW, posture is fairly upright. noted dyspnea 3/4 Gait Pattern:  Step-through pattern    Exercises     PT Diagnosis: Difficulty walking;Acute pain  PT Problem List: Decreased strength;Decreased activity tolerance;Decreased mobility;Pain;Decreased knowledge of use of DME PT Treatment Interventions: DME instruction;Gait training;Functional mobility training;Therapeutic activities;Therapeutic exercise;Patient/family education   PT Goals Acute Rehab PT Goals PT Goal Formulation: With patient Time For Goal Achievement: 04/02/12 Potential to Achieve Goals: Good Pt will go Supine/Side to Sit: Independently Pt will go Sit to Supine/Side: Independently PT Goal: Sit to Supine/Side - Progress: Goal set today Pt will go Sit to Stand: with supervision PT Goal: Sit to Stand - Progress: Goal set today Pt will go Stand to Sit: with supervision PT Goal: Stand to Sit - Progress: Goal set today Pt will Ambulate: >150 feet;with supervision;with least restrictive assistive device PT Goal: Ambulate - Progress: Goal set today Pt will Go Up / Down Stairs: 3-5 stairs;with supervision;with least restrictive assistive device PT Goal: Up/Down Stairs - Progress: Goal set today  Visit Information  Last PT Received On: 03/19/12 Assistance Needed: +2 (may need O2) PT/OT Co-Evaluation/Treatment: Yes    Subjective Data  Subjective: I will never go to surgery here again. Patient Stated Goal: to go hame and take care of my 2 babies.   Prior Functioning  Home Living Lives With:  (who works) Available Help at Discharge: Family Type of Home: Other (Comment) (condo) Home Access: Level entry Home Layout: Two level;Able to live on main level with bedroom/bathroom Bathroom Shower/Tub: Health visitor: Standard Home Adaptive Equipment: Shower chair without back;Walker - rolling Additional Comments: Pt. takes care of 2 children under 3 Y O 2 days and 2 nites/week. Pt is legally blind. 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 Communication Communication: No difficulties Dominant Hand: Right    Cognition  Overall Cognitive Status: Appears within functional limits for tasks assessed/performed Arousal/Alertness: Awake/alert Orientation Level: Appears intact for tasks assessed Behavior During Session: [facility] for tasks performed    Extremity/Trunk Assessment Right Lower Extremity Assessment RLE ROM/Strength/Tone: [facility] for tasks assessed Left Lower Extremity Assessment LLE ROM/Strength/Tone: [facility] for tasks assessed   Balance Static Sitting Balance Static Sitting - Balance Support: No upper extremity supported Static Sitting - Level of Assistance: 5: Stand by assistance  End of Session PT - End of Session Activity Tolerance: Patient tolerated treatment well;Patient limited by pain Patient left: in chair;with call bell/phone within reach Nurse Communication: Mobility status  GP     Rada Hay 03/19/2012, 10:01 AM  [phone]

## 2012-03-19 NOTE — Progress Notes (Signed)
Pt pulled out peripheral IV in right hand, saying that the some girl told her to pull it out.  Pt noted with confusion this shift and being verbally combative with staff, accusing them of lying to her.  IV team nurse stated she did not feel comfortable placing a PICC line in pt due to her confusion and pulling out IV.  MD notified.

## 2012-03-19 NOTE — Evaluation (Signed)
Occupational Therapy Evaluation Patient Details Name: GENEEN DIETER MRN: [id number] DOB: Jan 16, 1931 Today's Date: 03/19/2012 Time: 4098-1191 OT Time Calculation (min): 24 min  OT Assessment / Plan / Recommendation Clinical Impression   Pt. is s/p expl. lap w/ sigmoidectomy and lysis of adhesions. Pt is legally blind but was independent with all ADL PTA even caring for 2 small children. Skilled OT indicated to maximize independence with BADLs to supervision level in prep for safe d/c either home with HHOT or to SNF.    OT Assessment  Patient needs continued OT Services    Follow Up Recommendations  Home health OT;Skilled nursing facility (depending on progress.)    Barriers to Discharge Decreased caregiver support    Equipment Recommendations  None recommended by OT    Recommendations for Other Services    Frequency  Min 2X/week    Precautions / Restrictions Precautions Precautions: Fall Precaution Comments: abd. surgical site, decreased sats Restrictions Weight Bearing Restrictions: No   Pertinent Vitals/Pain Pt's O2 decreased to mid 80s following activity on RA. HR elevated to 125. Encouraged PLB and O2 reapplied.    ADL  Grooming: Simulated;Set up Where Assessed - Grooming: Unsupported sitting Upper Body Bathing: Simulated;Set up Where Assessed - Upper Body Bathing: Unsupported sitting Lower Body Bathing: Simulated;Moderate assistance Where Assessed - Lower Body Bathing: Supported sit to stand Upper Body Dressing: Simulated;Set up Where Assessed - Upper Body Dressing: Unsupported sitting Lower Body Dressing: Simulated;Moderate assistance Where Assessed - Lower Body Dressing: Supported sit to stand Toilet Transfer: Mining engineer Method: Sit to Barista: Other (comment) Nurse, children's) Toileting - Clothing Manipulation and Hygiene: Simulated;Minimal assistance Where Assessed - Engineer, mining and  Hygiene: Standing Equipment Used: Rolling walker ADL Comments: Pt having significant abdominal pain from surgery. Limited activity tolerance.    OT Diagnosis: Generalized weakness  OT Problem List: Decreased activity tolerance;Decreased knowledge of use of DME or AE;Pain;Impaired vision/perception;Cardiopulmonary status limiting activity OT Treatment Interventions: Self-care/ADL training;Therapeutic activities;DME and/or AE instruction;Patient/family education   OT Goals Acute Rehab OT Goals OT Goal Formulation: With patient Time For Goal Achievement: 04/02/12 Potential to Achieve Goals: Good ADL Goals Pt Will Perform Grooming: with supervision;Standing at sink ADL Goal: Grooming - Progress: Goal set today Pt Will Perform Lower Body Bathing: with supervision;Sit to stand from chair;Sit to stand from bed ADL Goal: Lower Body Bathing - Progress: Goal set today Pt Will Perform Lower Body Dressing: with supervision;Sit to stand from chair;Sit to stand from bed ADL Goal: Lower Body Dressing - Progress: Goal set today Pt Will Transfer to Toilet: with supervision;3-in-1;Comfort height toilet;Ambulation ADL Goal: Toilet Transfer - Progress: Goal set today Pt Will Perform Toileting - Clothing Manipulation: with supervision;Sitting on 3-in-1 or toilet;Standing ADL Goal: Toileting - Clothing Manipulation - Progress: Goal set today Pt Will Perform Toileting - Hygiene: with supervision;Sit to stand from 3-in-1/toilet ADL Goal: Toileting - Hygiene - Progress: Goal set today Pt Will Perform Tub/Shower Transfer: Shower transfer;with supervision;Ambulation ADL Goal: Tub/Shower Transfer - Progress: Goal set today  Visit Information  Last OT Received On: 03/19/12 Assistance Needed: +2 (may need O2) PT/OT Co-Evaluation/Treatment: Yes    Subjective Data  Subjective: I'm blind Patient Stated Goal: I want to get back to taking care of my babies.   Prior Functioning  Vision/Perception  Home  Living Lives With:  (who works) Available Help at Discharge: Family Type of Home: Other (Comment) (condo) Home Access: Level entry Home Layout: Two level;Able to live on main level with bedroom/bathroom  Bathroom Shower/Tub: Health visitor: Standard Home Adaptive Equipment: Environmental consultant - four wheeled;Shower chair without back Additional Comments: Pt. takes care of 2 children under 3 Y O 2 days and 2 nites/week. Pt is legally blind. 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 Communication Communication: No difficulties Dominant Hand: Right      Cognition  Overall Cognitive Status: Appears within functional limits for tasks assessed/performed Arousal/Alertness: Awake/alert Orientation Level: Appears intact for tasks assessed Behavior During Session: [facility] for tasks performed    Extremity/Trunk Assessment Right Upper Extremity Assessment RUE ROM/Strength/Tone: [facility] for tasks assessed Left Upper Extremity Assessment LUE ROM/Strength/Tone: WFL for tasks assessed Right Lower Extremity Assessment RLE ROM/Strength/Tone: [facility] for tasks assessed Left Lower Extremity Assessment LLE ROM/Strength/Tone: [facility] for tasks assessed   Mobility  Shoulder Instructions  Bed Mobility Bed Mobility: Rolling Left;Left Sidelying to Sit Rolling Left: With rail;5: Supervision Left Sidelying to Sit: 4: Min assist;With rails Details for Bed Mobility Assistance: VC to protect abdomen by rolling. Transfers Sit to Stand: 4: Min guard;From bed Stand to Sit: To chair/3-in-1;4: Min guard;With armrests Details for Transfer Assistance: VC for use of UE's        Exercise     Balance Static Sitting Balance Static Sitting - Balance Support: No upper extremity supported Static Sitting - Level of Assistance: 5: Stand by assistance   End of Session OT - End of  Session Activity Tolerance: Patient limited by fatigue;Patient limited by pain Patient left: in chair;with call bell/phone within reach Nurse Communication: Mobility status  GO     Castiel Lauricella A OTR/L [phone] 03/19/2012, 10:26 AM

## 2012-03-19 NOTE — Care Management Note (Signed)
    Page 1 of 1   03/21/2012     4:01:54 PM   CARE MANAGEMENT NOTE 03/21/2012  Patient:  [name], [name]   Account Number:  [id number]  Date Initiated:  03/19/2012  Documentation initiated by:  Lorenda Ishihara  Subjective/Objective Assessment:   78 yo female admitted s/p lap colectomy and lysis of adhesions. PTA lived at home with alone.     Action/Plan:   Anticipated DC Date:  03/25/2012   Anticipated DC Plan:  SKILLED NURSING FACILITY  In-house referral  Clinical Social Worker      DC Planning Services  CM consult      Choice offered to / List presented to:             Status of service:  Completed, signed off Medicare Important Message given?   (If response is "NO", the following Medicare IM given date fields will be blank) Date Medicare IM given:   Date Additional Medicare IM given:    Discharge Disposition:  SKILLED NURSING FACILITY  Per UR Regulation:  Reviewed for med. necessity/level of care/duration of stay  If discussed at Long Length of Stay Meetings, dates discussed:    Comments:

## 2012-03-19 NOTE — Progress Notes (Signed)
Name: [name] MRN: [id number] DOB: 15-Oct-1930    LOS: 2  Referring Provider:  Dr Michaell Cowing Reason for Referral:  Post op pulmonary care.  PULMONARY / CRITICAL CARE MEDICINE  PROFILE: 52 yowf remote smoker pulmonary pt of Dr Craige Cotta with clinical BOOP, Emphysema and chronic steroid dependency.   She underwent laparotomy 9/23 per Dr Michaell Cowing for rectosigmoid resection. She was intubated and extubated in OR and transferred to ICU. PCCM asked to evaluate pm 9/23  for post op 02 dep resp failure (not on 02 at baseline)    Current Status: No respiratory distress. No new complaints  Vital Signs: Temp:  [96.6 F (35.9 C)-98.9 F (37.2 C)] 98.9 F (37.2 C) (09/24 0845) Pulse Rate:  [59-98] 97  (09/24 0845) Resp:  [12-28] 18  (09/24 0845) BP: (94-131)/(31-64) 108/41 mmHg (09/24 0845) SpO2:  [90 %-99 %] 96 % (09/24 0845) Arterial Line BP: (164-180)/(44-50) 164/44 mmHg (09/23 1315)  Physical Examination: General:  RASS 0, NAD Neuro:  Intact HEENT:  NO LAN Neck:  No JVD Cardiovascular:  hsr rrr Lungs:  Bilateral rales, scattered expiratory wheezes Abdomen:  Dressing intact Ext: No edema Skin:  Warm  CXR: diffuse interstital prominence, bibasilar ATX  Principal Problem:  *Colonic stricture Active Problems:  HYPERTENSION  EMPHYSEMA  BOOP (bronchiolitis obliterans with organizing pneumonia)  CONSTIPATION, CHRONIC  Irritable bowel syndrome  Systemic sclerosis  Diverticulosis of colon  Diabetes mellitus  Acute respiratory failure  Anemia of chronic disease  Postoperative anemia   ASSESSMENT AND PLAN  Pulmonary A:   History of BOOP and chronic steroid use  Interstital lung disease in setting of history of PSS  Reported hx of COPD, quit smoking  P:   Cont PRN BD  Cont Flutter valve/IS Cont O2 as needed Taper steroids back to baseline dose  CARDIOVASCULAR  A: h/o CAD/HTN P:  resume home meds    RENAL  Lab 03/19/12 0322 03/18/12 1600 03/18/12 1001 03/17/12 1157  03/13/12 1520  NA 136 142 140 135 137  K 4.9 2.7* -- -- --  CL 106 119* -- 99 99  CO2 25 16* -- 25 28  BUN 8 6 -- 19 15  CREATININE 0.75 0.46* -- 0.80 0.71  CALCIUM 7.4* 5.3* -- 8.4 9.0  MG -- -- -- -- --  PHOS -- -- -- -- --   Intake/Output      09/23 0701 - 09/24 0700 09/24 0701 - 09/25 0700   P.O. 440    I.V. (mL/kg) 3800 (62.9) 75 (1.2)   IV Piggyback 860    Total Intake(mL/kg) 5100 (84.4) 75 (1.2)   Urine (mL/kg/hr) 1175 (0.8) 425 (1.6)   Stool     Blood 200    Total Output 1375 425   Net +3725 -350          A:  Hypokalemia, resolved P:     GASTROINTESTINAL  Lab 03/19/12 0322 03/17/12 1157  AST 31 20  ALT 40* 20  ALKPHOS 55 75  BILITOT 0.2* 0.2*  PROT 4.4* 6.2  ALBUMIN 2.2* 3.4*    A:  Post Lap P:   Per [facility]  HEMATOLOGIC  Lab 03/19/12 0322 03/18/12 1233 03/18/12 1001 03/18/12 0800 03/17/12 1157 03/13/12 1520  HGB 7.3* 7.4* 8.5* -- 9.1* 9.9*  HCT 23.7* 23.8* 25.0* -- 29.1* 31.4*  PLT 160 -- -- -- 178 194  INR -- -- -- 1.08 -- --  APTT -- -- -- -- -- --   A:  Anemia acute blood  loss P:  Monitor for blood loss anemia and tx for hgb <7.0   INFECTIOUS  Lab 03/19/12 0322 03/17/12 1157 03/13/12 1520  WBC 7.2 7.5 5.0  PROCALCITON -- -- --   Cultures:  Antibiotics: Cefotatn 9/23 per [facility]  A: No acute issues periop rx per [facility]       ENDOCRINE  Lab 03/19/12 0736 03/18/12 2134 03/18/12 1736 03/18/12 1237 03/18/12 0655  GLUCAP 141* 151* 149* 199* 120*   A: DM  P:   SSI but should avoid any insulin containing sulpha      Billy Fischer, MD ; [facility] service Mobile [phone].  After 5:30 PM or weekends, call [phone]

## 2012-03-20 LAB — CBC
HCT: 23.3 % — ABNORMAL LOW (ref 36.0–46.0)
Hemoglobin: 7.2 g/dL — ABNORMAL LOW (ref 12.0–15.0)
MCH: 23.6 pg — ABNORMAL LOW (ref 26.0–34.0)
MCHC: 30.9 g/dL (ref 30.0–36.0)
MCV: 76.4 fL — ABNORMAL LOW (ref 78.0–100.0)
Platelets: 173 10*3/uL (ref 150–400)
RBC: 3.05 MIL/uL — ABNORMAL LOW (ref 3.87–5.11)
RDW: 16 % — ABNORMAL HIGH (ref 11.5–15.5)
WBC: 8.7 10*3/uL (ref 4.0–10.5)

## 2012-03-20 LAB — CREATININE, SERUM
Creatinine, Ser: 0.77 mg/dL (ref 0.50–1.10)
GFR calc Af Amer: >90 mL/min (ref >90)
GFR calc non Af Amer: 78 mL/min — ABNORMAL LOW (ref >90)

## 2012-03-20 LAB — GLUCOSE, CAPILLARY
Glucose-Capillary: 223 mg/dL — ABNORMAL HIGH (ref 70–99)
Glucose-Capillary: 141 mg/dL — ABNORMAL HIGH (ref 70–99)
Glucose-Capillary: 135 mg/dL — ABNORMAL HIGH (ref 70–99)
Glucose-Capillary: 132 mg/dL — ABNORMAL HIGH (ref 70–99)

## 2012-03-20 LAB — POTASSIUM: Potassium: 3.6 mEq/L (ref 3.5–5.1)

## 2012-03-20 MED ORDER — NAPROXEN 500 MG PO TABS
500.0000 mg | ORAL_TABLET | Freq: Two times a day (BID) | ORAL | Status: DC
  Administered 2012-03-20: 500 mg via ORAL
  Filled 2012-03-20 (×4): qty 1

## 2012-03-20 MED ORDER — PREDNISONE 5 MG PO TABS
5.0000 mg | ORAL_TABLET | Freq: Every day | ORAL | Status: DC
  Administered 2012-03-21 – 2012-03-22 (×2): 5 mg via ORAL
  Filled 2012-03-20 (×4): qty 1

## 2012-03-20 MED ORDER — IMIPRAMINE HCL 50 MG PO TABS
150.0000 mg | ORAL_TABLET | Freq: Every day | ORAL | Status: DC
  Administered 2012-03-20 – 2012-03-21 (×2): 150 mg via ORAL
  Filled 2012-03-20 (×3): qty 3

## 2012-03-20 MED ORDER — ACETAMINOPHEN 500 MG PO TABS: 1000.0000 mg | ORAL_TABLET | Freq: Four times a day (QID) | ORAL | Status: DC | PRN

## 2012-03-20 MED ORDER — ACETAMINOPHEN 10 MG/ML IV SOLN
1000.0000 mg | Freq: Four times a day (QID) | INTRAVENOUS | Status: DC
  Administered 2012-03-20: 1000 mg via INTRAVENOUS
  Filled 2012-03-20: qty 100

## 2012-03-20 MED ORDER — TRAMADOL HCL 50 MG PO TABS
50.0000 mg | ORAL_TABLET | Freq: Four times a day (QID) | ORAL | Status: DC | PRN
  Administered 2012-03-20 – 2012-03-21 (×2): 50 mg via ORAL
  Filled 2012-03-20 (×3): qty 1

## 2012-03-20 NOTE — Progress Notes (Signed)
Patient refused tofranil stating she is allergic to it.

## 2012-03-20 NOTE — Progress Notes (Signed)
Ask by nurses to discuss pain med, she only has dilaudid for pain, and plain tylenol.  She says tylenol doesn't work for her.  She is on ice chips and sips so I am going to try some IV tylenol with her today.

## 2012-03-20 NOTE — Progress Notes (Signed)
Dr. Carolynne Edouard notified regarding patient refusal to have iv restarted.  Order received to leave iv out until am when it can be reassessed tomorrow morning by Dr. Michaell Cowing.  Francene Finders, RN 03/20/2012 [phone]

## 2012-03-20 NOTE — Progress Notes (Signed)
Try naproxen Increase TCA for neuropathic pain PO narcotics uwhen taking PO well

## 2012-03-20 NOTE — Progress Notes (Addendum)
[name] [id number] [birth year]   Subjective:  Transferred to floor Pain minimal and controlled  No nausea/vomiting.  Tolerating liquids Frustrated with nurses.  Claiming there are abusive.  Nurses deny this.  Patient confused and pulling out IV.  Patient denies this.  Confused about lines on On-Q pump.  I again reeducated her on these things Patient does not like black nurses IV Nurses concerned about placing PICC while she was on subcutaneous heparin.  Placed specialize IV and upper arm instead.  I was notified of this after the fact.   Objective:  Vital signs:  Filed Vitals:   03/19/12 0845 03/19/12 1400 03/19/12 2040 03/19/12 2214  BP: 108/41 118/36  119/40  Pulse: 97 92 94 98  Temp: 98.9 F (37.2 C) 98.7 F (37.1 C)  99.3 F (37.4 C)  TempSrc: Oral Oral  Oral  Resp: 18 16  18   Height:      Weight:      SpO2: 96% 90%  95%    Last BM Date: 03/17/12  Intake/Output   Yesterday:  09/24 0701 - 09/25 0700 In: 1122 [P.O.:820; I.V.:302] Out: 2025 [Urine:2025] This shift:  Total I/O In: 567 [P.O.:340; I.V.:227] Out: 1150 [Urine:1150]  Bowel function:  Flatus: ?Y  BM: n  Physical Exam:  General: Pt awake/alert/oriented x4 in no acute distress Eyes: PERRL, normal EOM.  Sclera clear.  No icterus Neuro: CN II-XII intact w/o focal sensory/motor deficits. Lymph: No head/neck/groin lymphadenopathy Psych:  No delerium/psychosis/paranoia.  Recall and memory poor but her baseline.  Not particularly combative but mildly irritated HENT: Normocephalic, Mucus membranes moist.  No thrush Neck: Supple, No tracheal deviation Chest: No chest wall pain w good excursion.  Dec BS at base CV:  Pulses intact.  Regular rhythm Abdomen: Soft.  Nondistended.  Mildly tender at incisions only.  No incarcerated hernias.  Dressings clean Ext:  SCDs BLE.  No mjr edema.  No cyanosis Skin: No petechiae / purpurae  Problem List:  Principal Problem:  *Colonic stricture Active  Problems:  HYPERTENSION  EMPHYSEMA  BOOP (bronchiolitis obliterans with organizing pneumonia)  CONSTIPATION, CHRONIC  Irritable bowel syndrome  Systemic sclerosis  Diverticulosis of colon  Diabetes mellitus  Acute respiratory failure  Anemia of chronic disease  Postoperative anemia  Acute respiratory failure with hypoxia  Postinflammatory pulmonary fibrosis  Acute and chronic respiratory failure following trauma and surgery   Assessment  [name]  78 y.o. female  2 Days Post-Op  Procedure(s): LAPAROSCOPIC PARTIAL COLECTOMY PROCTOSCOPY LAPAROSCOPIC LYSIS OF ADHESIONS  Stabilizing  Plan:  -Still with baseline confusion and agitation making a mild struggle to manage the patient. No severe issues at this time.  We will see if we can have a new nurse to see her.  -adv diet gradually -anemia - follow. Transfuse only if <7 or worsening hypoxia.  Hold on Ticlid until Hgb stable x2 days -steroid taper for pulmon issues.  Pulmonary help appreciated -try to keep dry. -f/u path -HTN control better w oral amlodipine, PRN hydralazine -VTE prophylaxis- SCDs, etc -mobilize as tolerated to help recovery.  PT/OT evals -  [name] Sportsman, M.D., F.A.C.S. Gastrointestinal and Minimally Invasive Surgery [facility] [address], [address] [place], [place] [address] [phone] Main / Paging [phone] Voice Mail   03/20/2012  CARE TEAM:  PCP: Rama (Loreen Freud, MD  Outpatient Care Team: Patient Care Team: Rama (Georgianne Fick) Sofie Hartigan as PCP - General (Internal Medicine) Mardella Layman, MD as  Consulting Physician (Gastroenterology) Coralyn Helling, MD as Consulting Physician (Pulmonary Disease) Pricilla Riffle, MD as Consulting Physician (Cardiology)  Inpatient Treatment Team: Treatment Team: Attending Provider: [name] Sportsman, MD; Technician: Burnard Bunting, [place]; Technician: Calton Golds, [place]; Consulting  Physician: Coralyn Helling, MD; Consulting Physician: Nyoka Cowden, MD; Rounding Team: Md Pccm, MD; Registered Nurse: Gita Kudo Main, RN; Registered Nurse: Gerald Stabs, RN; Technician: Vella Raring, NT; Registered Nurse: Birdie Sons, RN   Results:   Labs: Results for orders placed during the hospital encounter of 03/17/12 (from the past 48 hour(s))  GLUCOSE, CAPILLARY     Status: Abnormal   Collection Time   03/18/12  6:55 AM      Component Value Range Comment   Glucose-Capillary 120 (*) 70 - 99 mg/dL   PROTIME-INR     Status: Normal   Collection Time   03/18/12  8:00 AM      Component Value Range Comment   Prothrombin Time 13.9  11.6 - 15.2 seconds    INR 1.08  0.00 - 1.49   POCT I-STAT 4, (NA,K, GLUC, HGB,HCT)     Status: Abnormal   Collection Time   03/18/12 10:01 AM      Component Value Range Comment   Sodium 140  135 - 145 mEq/L    Potassium 3.9  3.5 - 5.1 mEq/L    Glucose, Bld 197 (*) 70 - 99 mg/dL    HCT 21.3 (*) 08.6 - 46.0 %    Hemoglobin 8.5 (*) 12.0 - 15.0 g/dL   HEMOGLOBIN AND HEMATOCRIT, BLOOD     Status: Abnormal   Collection Time   03/18/12 12:33 PM      Component Value Range Comment   Hemoglobin 7.4 (*) 12.0 - 15.0 g/dL    HCT 57.8 (*) 46.9 - 46.0 %   GLUCOSE, CAPILLARY     Status: Abnormal   Collection Time   03/18/12 12:37 PM      Component Value Range Comment   Glucose-Capillary 199 (*) 70 - 99 mg/dL   BASIC METABOLIC PANEL     Status: Abnormal   Collection Time   03/18/12  4:00 PM      Component Value Range Comment   Sodium 142  135 - 145 mEq/L DELTA CHECK NOTED   Potassium 2.7 (*) 3.5 - 5.1 mEq/L    Chloride 119 (*) 96 - 112 mEq/L DELTA CHECK NOTED   CO2 16 (*) 19 - 32 mEq/L    Glucose, Bld 151 (*) 70 - 99 mg/dL    BUN 6  6 - 23 mg/dL DELTA CHECK NOTED   Creatinine, Ser 0.46 (*) 0.50 - 1.10 mg/dL DELTA CHECK NOTED   Calcium 5.3 (*) 8.4 - 10.5 mg/dL    GFR calc non Af Amer >90  >90 mL/min    GFR calc Af Amer >90  >90  mL/min   GLUCOSE, CAPILLARY     Status: Abnormal   Collection Time   03/18/12  5:36 PM      Component Value Range Comment   Glucose-Capillary 149 (*) 70 - 99 mg/dL   GLUCOSE, CAPILLARY     Status: Abnormal   Collection Time   03/18/12  9:34 PM      Component Value Range Comment   Glucose-Capillary 151 (*) 70 - 99 mg/dL    Comment 1 Notify RN     CBC     Status: Abnormal   Collection Time   03/19/12  3:22 AM  Component Value Range Comment   WBC 7.2  4.0 - 10.5 K/uL    RBC 3.07 (*) 3.87 - 5.11 MIL/uL    Hemoglobin 7.3 (*) 12.0 - 15.0 g/dL    HCT 40.9 (*) 81.1 - 46.0 %    MCV 77.2 (*) 78.0 - 100.0 fL    MCH 23.8 (*) 26.0 - 34.0 pg    MCHC 30.8  30.0 - 36.0 g/dL    RDW 91.4 (*) 78.2 - 15.5 %    Platelets 160  150 - 400 K/uL   HEMOGLOBIN A1C     Status: Abnormal   Collection Time   03/19/12  3:22 AM      Component Value Range Comment   Hemoglobin A1C 7.2 (*) <5.7 %    Mean Plasma Glucose 160 (*) <117 mg/dL   COMPREHENSIVE METABOLIC PANEL     Status: Abnormal   Collection Time   03/19/12  3:22 AM      Component Value Range Comment   Sodium 136  135 - 145 mEq/L    Potassium 4.9  3.5 - 5.1 mEq/L    Chloride 106  96 - 112 mEq/L    CO2 25  19 - 32 mEq/L    Glucose, Bld 127 (*) 70 - 99 mg/dL    BUN 8  6 - 23 mg/dL    Creatinine, Ser 9.56  0.50 - 1.10 mg/dL    Calcium 7.4 (*) 8.4 - 10.5 mg/dL    Total Protein 4.4 (*) 6.0 - 8.3 g/dL    Albumin 2.2 (*) 3.5 - 5.2 g/dL    AST 31  0 - 37 U/L    ALT 40 (*) 0 - 35 U/L    Alkaline Phosphatase 55  39 - 117 U/L    Total Bilirubin 0.2 (*) 0.3 - 1.2 mg/dL    GFR calc non Af Amer 78 (*) >90 mL/min    GFR calc Af Amer >90  >90 mL/min   GLUCOSE, CAPILLARY     Status: Abnormal   Collection Time   03/19/12  7:36 AM      Component Value Range Comment   Glucose-Capillary 141 (*) 70 - 99 mg/dL    Comment 1 Documented in Chart      Comment 2 Notify RN     GLUCOSE, CAPILLARY     Status: Normal   Collection Time   03/19/12 12:21 PM       Component Value Range Comment   Glucose-Capillary 77  70 - 99 mg/dL   GLUCOSE, CAPILLARY     Status: Normal   Collection Time   03/19/12  5:02 PM      Component Value Range Comment   Glucose-Capillary 87  70 - 99 mg/dL   GLUCOSE, CAPILLARY     Status: Abnormal   Collection Time   03/19/12 10:09 PM      Component Value Range Comment   Glucose-Capillary 143 (*) 70 - 99 mg/dL   CBC     Status: Abnormal   Collection Time   03/20/12  5:10 AM      Component Value Range Comment   WBC 8.7  4.0 - 10.5 K/uL    RBC 3.05 (*) 3.87 - 5.11 MIL/uL    Hemoglobin 7.2 (*) 12.0 - 15.0 g/dL    HCT 21.3 (*) 08.6 - 46.0 %    MCV 76.4 (*) 78.0 - 100.0 fL    MCH 23.6 (*) 26.0 - 34.0 pg    MCHC 30.9  30.0 - 36.0 g/dL    RDW 16.1 (*) 09.6 - 15.5 %    Platelets 173  150 - 400 K/uL     Imaging / Studies: Dg Chest Port 1 View  03/19/2012  *RADIOLOGY REPORT*  Clinical Data: Atelectasis.  PORTABLE CHEST - 1 VIEW  Comparison: 03/13/2012.  Findings: Worsening pulmonary aeration compared yesterday's exam. There is no interstitial prominence, which at the bases appears representing combination of atelectasis and probable interstitial pulmonary edema.  Lung volumes are lower than on prior.  There may be a small left pleural effusion.  Cardiopericardial silhouette is unchanged. Monitoring leads are projected over the chest.  IMPRESSION: Worsening pulmonary aeration, predominately due to atelectasis. There may be some interstitial pulmonary edema as well.  Probable small left pleural effusion.   Original Report Authenticated By: Andreas Newport, M.D.     Medications / Allergies: per chart  Antibiotics: Anti-infectives     Start     Dose/Rate Route Frequency Ordered Stop   03/18/12 1500   cefoTEtan (CEFOTAN) 1 g in dextrose 5 % 50 mL IVPB     Comments: Pharmacy may adjust dose strength for optimal dosing      1 g 100 mL/hr over 30 Minutes Intravenous Every 12 hours 03/18/12 1427 03/18/12 1606   03/18/12 0715    clindamycin (CLEOCIN) 900 mg, gentamicin (GARAMYCIN) 240 mg in sodium chloride 0.9 % 1,000 mL for intraperitoneal lavage  Status:  Discontinued         Irrigation To Surgery 03/18/12 0707 03/19/12 1246   03/18/12 0454   cefoTEtan (CEFOTAN) 2 g in dextrose 5 % 50 mL IVPB     Comments: Pharmacy may adjust dose strength for optimal dosing      2 g 100 mL/hr over 30 Minutes Intravenous On call to O.R. 03/18/12 0454 03/18/12 0745

## 2012-03-20 NOTE — Progress Notes (Signed)
Clinical Social Work Department CLINICAL SOCIAL WORK PLACEMENT NOTE 03/20/2012  Patient:  [name], [name]  Account Number:  [id number] Admit date:  03/17/2012  Clinical Social Worker:  Cori Razor, LCSW  Date/time:  03/20/2012 02:20 PM  Clinical Social Work is seeking post-discharge placement for this patient at the following level of care:   SKILLED NURSING   (*CSW will update this form in Epic as items are completed)   03/20/2012  Patient/family provided with [facility] Department of Clinical Social Work's list of facilities offering this level of care within the geographic area requested by the patient (or if unable, by the patient's family).  03/20/2012  Patient/family informed of their freedom to choose among providers that offer the needed level of care, that participate in Medicare, Medicaid or managed care program needed by the patient, have an available bed and are willing to accept the patient.    Patient/family informed of [facility]' ownership interest in [facility], as well as of the fact that they are under no obligation to receive care at this facility.  PASARR submitted to EDS on 03/20/2012 PASARR number received from EDS on 03/20/2012  FL2 transmitted to all facilities in geographic area requested by pt/family on  03/20/2012 FL2 transmitted to all facilities within larger geographic area on   Patient informed that his/her managed care company has contracts with or will negotiate with  certain facilities, including the following:     Patient/family informed of bed offers received:   Patient chooses bed at  Physician recommends and patient chooses bed at    Patient to be transferred to  on   Patient to be transferred to facility by   The following physician request were entered in Epic:   Additional Comments:  Cori Razor LCSW [phone]

## 2012-03-20 NOTE — Progress Notes (Signed)
Name: [name] MRN: [id number] DOB: Apr 21, 1931    LOS: 3  Referring Provider:  Dr Michaell Cowing Reason for Referral:  Post op pulmonary care.  PULMONARY / CRITICAL CARE MEDICINE  PROFILE: 76 yowf remote smoker pulmonary pt of Dr Craige Cotta with clinical BOOP, Emphysema and chronic steroid dependency.   She underwent laparotomy 9/23 per Dr Michaell Cowing for rectosigmoid resection. She was intubated and extubated in OR and transferred to ICU. PCCM asked to evaluate pm 9/23  for post op 02 dep resp failure (not on 02 at baseline)    Current Status: No respiratory distress. No new complaints  Vital Signs: Temp:  [97.7 F (36.5 C)-99.3 F (37.4 C)] 98.8 F (37.1 C) (09/25 1000) Pulse Rate:  [92-98] 98  (09/25 1000) Resp:  [16-20] 20  (09/25 1000) BP: (118-135)/(36-89) 135/89 mmHg (09/25 1000) SpO2:  [90 %-95 %] 95 % (09/25 1000)  Physical Examination: General:  RASS 0, NAD Neuro:  Intact HEENT:  NO LAN Neck:  No JVD Cardiovascular:  hsr rrr Lungs:  Bilateral rales, Abdomen:  Dressing intact Ext: No edema Skin:  Warm  CXR: diffuse interstital prominence, bibasilar ATX  Principal Problem:  *Colonic stricture Active Problems:  HYPERTENSION  EMPHYSEMA  BOOP (bronchiolitis obliterans with organizing pneumonia)  CONSTIPATION, CHRONIC  Irritable bowel syndrome  Systemic sclerosis  Diverticulosis of colon  Diabetes mellitus  Acute respiratory failure  Anemia of chronic disease  Postoperative anemia  Acute respiratory failure with hypoxia  Postinflammatory pulmonary fibrosis  Acute and chronic respiratory failure following trauma and surgery   ASSESSMENT AND PLAN  Pulmonary A:   History of BOOP and chronic steroid use  Interstital lung disease in setting of history of PSS  Reported hx of COPD, quit smoking  P:   Cont PRN BD  Cont Flutter valve/IS Cont O2 as needed Taper steroids back to baseline dose  CARDIOVASCULAR  A: h/o CAD/HTN P:  resume home meds    RENAL  Lab  03/20/12 0510 03/19/12 0322 03/18/12 1600 03/18/12 1001 03/17/12 1157 03/13/12 1520  NA -- 136 142 140 135 137  K 3.6 4.9 -- -- -- --  CL -- 106 119* -- 99 99  CO2 -- 25 16* -- 25 28  BUN -- 8 6 -- 19 15  CREATININE 0.77 0.75 0.46* -- 0.80 0.71  CALCIUM -- 7.4* 5.3* -- 8.4 9.0  MG -- -- -- -- -- --  PHOS -- -- -- -- -- --   Intake/Output      09/24 0701 - 09/25 0700 09/25 0701 - 09/26 0700   P.O. 920    I.V. (mL/kg) 435 (7.2)    IV Piggyback     Total Intake(mL/kg) 1355 (22.4)    Urine (mL/kg/hr) 2325 (1.6)    Blood     Total Output 2325    Net -970           A:  Hypokalemia, resolved P:     GASTROINTESTINAL  Lab 03/19/12 0322 03/17/12 1157  AST 31 20  ALT 40* 20  ALKPHOS 55 75  BILITOT 0.2* 0.2*  PROT 4.4* 6.2  ALBUMIN 2.2* 3.4*    A:  Post Lap P:   Per ccs  HEMATOLOGIC  Lab 03/20/12 0510 03/19/12 0322 03/18/12 1233 03/18/12 1001 03/18/12 0800 03/17/12 1157 03/13/12 1520  HGB 7.2* 7.3* 7.4* 8.5* -- 9.1* --  HCT 23.3* 23.7* 23.8* 25.0* -- 29.1* --  PLT 173 160 -- -- -- 178 194  INR -- -- -- --  1.08 -- --  APTT -- -- -- -- -- -- --   A:  Anemia acute blood loss P:  Monitor for blood loss anemia and tx for hgb <7.0   INFECTIOUS  Lab 03/20/12 0510 03/19/12 0322 03/17/12 1157 03/13/12 1520  WBC 8.7 7.2 7.5 5.0  PROCALCITON -- -- -- --   Cultures:  Antibiotics: Cefotatn 9/23 per [facility]  A: No acute issues periop rx per [facility]       ENDOCRINE  Lab 03/20/12 0835 03/19/12 2209 03/19/12 1702 03/19/12 1221 03/19/12 0736  GLUCAP 223* 143* 87 77 141*   A: DM  P:   SSI but should avoid any insulin containing sulpha   PCCM will sign off 9/25.   [name] Canales Minor ACNP [name] PCCM Pager [phone] till 3 pm If no answer page [phone] 03/20/2012, 11:05 AM  Looks much better. Resume home dose of Prednisone. PCCM will sign off. Please call if we can be of further assistance  Billy Fischer, MD ; [facility] [phone].  After 5:30 PM or  weekends, call [phone]

## 2012-03-20 NOTE — Progress Notes (Signed)
Physical Therapy Treatment Patient Details Name: [name] MRN: [id number] DOB: 1930/11/07 Today's Date: 03/20/2012 Time: 3086-5784 PT Time Calculation (min): 24 min  PT Assessment / Plan / Recommendation Comments on Treatment Session  Mobilizing fairly well. Fatigues easily. ST rehab should still be considered    Follow Up Recommendations  Home health PT vs Skilled nursing facility (depending on progress)    Barriers to Discharge        Equipment Recommendations       Recommendations for Other Services    Frequency Min 3X/week   Plan Discharge plan remains appropriate    Precautions / Restrictions Precautions Precautions: Fall Precaution Comments: abdominal surgery. Monitor sats.  Restrictions Weight Bearing Restrictions: No   Pertinent Vitals/Pain Denies pain    Mobility  Bed Mobility Bed Mobility: Sit to Supine Sit to Supine: 4: Min guard;HOB elevated Transfers Transfers: Sit to Stand;Stand to Sit Sit to Stand: 4: Min guard;From chair/3-in-1;With armrests Stand to Sit: 4: Min guard;To bed;With upper extremity assist Details for Transfer Assistance: VCs safety, hand placement.  Ambulation/Gait Ambulation/Gait Assistance: 4: Min assist Ambulation Distance (Feet): 75 Feet Assistive device: Rolling walker Ambulation/Gait Assistance Details: VCs safety, posture, distance from RW. Dsypnea 3/4. Fatigues fairly easily.  Gait Pattern: Decreased stride length;Step-through pattern    Exercises     PT Diagnosis:    PT Problem List:   PT Treatment Interventions:     PT Goals Acute Rehab PT Goals Pt will go Supine/Side to Sit: Independently PT Goal: Supine/Side to Sit - Progress: Progressing toward goal Pt will go Sit to Supine/Side: Independently PT Goal: Sit to Supine/Side - Progress: Progressing toward goal Pt will go Sit to Stand: with supervision Pt will go Stand to Sit: with supervision PT Goal: Stand to Sit - Progress: Progressing toward goal Pt will  Ambulate: >150 feet;with supervision;with least restrictive assistive device PT Goal: Ambulate - Progress: Progressing toward goal  Visit Information  Last PT Received On: 03/20/12 Assistance Needed: +1    Subjective Data  Subjective: "I just don't feel good today" Patient Stated Goal: Home. Feel better   Cognition  Overall Cognitive Status: Appears within functional limits for tasks assessed/performed Arousal/Alertness: Awake/alert Orientation Level: Appears intact for tasks assessed Behavior During Session: [facility] for tasks performed    Balance     End of Session PT - End of Session Activity Tolerance: Patient limited by fatigue Patient left: in bed;with call bell/phone within reach   GP     Rebeca Alert [facility] 03/20/2012, 11:31 AM [phone]

## 2012-03-20 NOTE — Progress Notes (Signed)
Patient has been very irritable and anxious tonight. Suspicious of staff and thinks staff are laughing at her and treating her "wrong". Patient c/o that a NT was trying to pull her foley out because "they had took off her support" when patient had actually removed leg strap from thigh and was trying to pull it off her leg. Patient also having hallucinations of seeing "boys in the room" that are moving the bed, "trying to hang me" pointing to trapeze above the bed, and "having a Halloween party". Patient told this RN to "take those boys with you when you go. I want to watch them leave. If I don't see them leave I know you are lying." When trying to reorient and reassure patient that these "boys" are not real patient very defensive and suspicious that staff is "lying". Patient spent most the night sitting on side of bed because "those boys won't leave the bed alone". Bed alarm on all night. Trapeze turned to the side of bed. Reorient patient as could. Continue to monitor. Maintain safety.

## 2012-03-20 NOTE — Progress Notes (Signed)
Clinical Social Work Department BRIEF PSYCHOSOCIAL ASSESSMENT 03/20/2012  Patient:  [name], [name]     Account Number:  [id number]     Admit date:  03/17/2012  Clinical Social Worker:  Candie Chroman  Date/Time:  03/20/2012 02:10 PM  Referred by:  Care Management  Date Referred:  03/20/2012 Referred for  SNF Placement   Other Referral:   Interview type:  Patient Other interview type:    PSYCHOSOCIAL DATA Living Status:  FAMILY Admitted from facility:   Level of care:   Primary support name:  Breane Grunwald Primary support relationship to patient:  CHILD, ADULT Degree of support available:   supportive    CURRENT CONCERNS Current Concerns  Post-Acute Placement   Other Concerns:    SOCIAL WORK ASSESSMENT / PLAN Pt is a 78 yr old female living at home with family prior to hospitalization. CSW met with pt to assist with d/c planning.  Pt states she is alone during the day . PT has recommended ST SNF placement upon hospital d/c. Pt is willing to discuss this option with her daughter. Pt declined to let CSW contact family at this time. Pt provided permission for SNF search and stated she might be interested in [facility] ( [facility] if placement was needed. SNF search has been initiated and bed offers will be provided as received. [facility] contacted and will review pt's referral.   Assessment/plan status:  Psychosocial Support/Ongoing Assessment of Needs Other assessment/ plan:   Information/referral to community resources:   SNF list provided.    PATIENT'S/FAMILY'S RESPONSE TO PLAN OF CARE: Pt would prefer to return home but will discuss possible ST Rehab with her daughter.    Cori Razor LCSW [phone]

## 2012-03-21 DIAGNOSIS — F4489 Other dissociative and conversion disorders: Secondary | ICD-10-CM | POA: Diagnosis present

## 2012-03-21 DIAGNOSIS — D62 Acute posthemorrhagic anemia: Secondary | ICD-10-CM

## 2012-03-21 LAB — MAGNESIUM: Magnesium: 1.4 mg/dL — ABNORMAL LOW (ref 1.5–2.5)

## 2012-03-21 LAB — CREATININE, SERUM
Creatinine, Ser: 0.68 mg/dL (ref 0.50–1.10)
GFR calc Af Amer: >90 mL/min (ref >90)
GFR calc non Af Amer: 81 mL/min — ABNORMAL LOW (ref >90)

## 2012-03-21 LAB — CBC
HCT: 21.5 % — ABNORMAL LOW (ref 36.0–46.0)
Hemoglobin: 6.8 g/dL — CL (ref 12.0–15.0)
MCH: 23.8 pg — ABNORMAL LOW (ref 26.0–34.0)
MCHC: 31.6 g/dL (ref 30.0–36.0)
MCV: 75.2 fL — ABNORMAL LOW (ref 78.0–100.0)
Platelets: 193 10*3/uL (ref 150–400)
RBC: 2.86 MIL/uL — ABNORMAL LOW (ref 3.87–5.11)
RDW: 15.7 % — ABNORMAL HIGH (ref 11.5–15.5)
WBC: 8.1 10*3/uL (ref 4.0–10.5)

## 2012-03-21 LAB — GLUCOSE, CAPILLARY
Glucose-Capillary: 111 mg/dL — ABNORMAL HIGH (ref 70–99)
Glucose-Capillary: 144 mg/dL — ABNORMAL HIGH (ref 70–99)
Glucose-Capillary: 138 mg/dL — ABNORMAL HIGH (ref 70–99)
Glucose-Capillary: 144 mg/dL — ABNORMAL HIGH (ref 70–99)

## 2012-03-21 LAB — POTASSIUM: Potassium: 2.8 mEq/L — ABNORMAL LOW (ref 3.5–5.1)

## 2012-03-21 LAB — TYPE AND SCREEN
ABO/RH(D): B POS
Antibody Screen: NEGATIVE

## 2012-03-21 MED ORDER — POTASSIUM CHLORIDE CRYS ER 20 MEQ PO TBCR
40.0000 meq | EXTENDED_RELEASE_TABLET | Freq: Every day | ORAL | Status: DC
  Administered 2012-03-21: 40 meq via ORAL
  Filled 2012-03-21 (×2): qty 2

## 2012-03-21 MED ORDER — LACTATED RINGERS IV BOLUS (SEPSIS): 1000.0000 mL | Freq: Three times a day (TID) | INTRAVENOUS | Status: DC | PRN

## 2012-03-21 MED ORDER — SODIUM CHLORIDE 0.9 % IJ SOLN: 3.0000 mL | Freq: Two times a day (BID) | INTRAMUSCULAR | Status: DC

## 2012-03-21 MED ORDER — SODIUM CHLORIDE 0.9 % IJ SOLN: 3.0000 mL | INTRAMUSCULAR | Status: DC | PRN

## 2012-03-21 MED ORDER — FERROUS SULFATE 325 (65 FE) MG PO TABS
325.0000 mg | ORAL_TABLET | Freq: Two times a day (BID) | ORAL | Status: DC
  Administered 2012-03-22: 325 mg via ORAL
  Filled 2012-03-21 (×5): qty 1

## 2012-03-21 NOTE — Progress Notes (Signed)
[name] [id number] [birth year]   Subjective:  Pain minimal and controlled   Pt still gets confused & pulls at IVs.  Not touching ONQ/dressings  No nausea/vomiting.  Tolerating fulls  IV Nurses concerned about placing PICC while she was on subcutaneous heparin.  Placed specialize IV and upper arm instead.  I was notified of this after the fact.  IVs pulled by pt x1, Other placed by IV nurses "infiltrated"   Objective:  Vital signs:  Filed Vitals:   03/20/12 1000 03/20/12 1400 03/20/12 2155 03/21/12 0537  BP: 135/89 145/48 148/59 137/54  Pulse: 98 90 93 85  Temp: 98.8 F (37.1 C) 98.1 F (36.7 C) 97.9 F (36.6 C) 97.5 F (36.4 C)  TempSrc: Oral Oral Oral Oral  Resp: 20 20 18 18   Height:      Weight:      SpO2: 95% 95% 93% 93%    Last BM Date: 03/20/12  Intake/Output   Yesterday:  09/25 0701 - 09/26 0700 In: 600 [P.O.:600] Out: 2100 [Urine:1900; Stool:200] This shift:     Bowel function:  Flatus: Y  BM: Y, loose, no blood  Physical Exam:  General: Pt awake/alert/oriented x4 in no acute distress Eyes: PERRL, normal EOM.  Sclera clear.  No icterus Neuro: CN II-XII intact w/o focal sensory/motor deficits. Lymph: No head/neck/groin lymphadenopathy Psych:  No delerium/psychosis/paranoia.  Recall and memory poor but her baseline.  Not particularly combative but mildly irritated HENT: Normocephalic, Mucus membranes moist.  No thrush Neck: Supple, No tracheal deviation Chest: No chest wall pain w good excursion.  Dec BS at base CV:  Pulses intact.  Regular rhythm Abdomen: Soft.  Nondistended.  Nontender.  Incisions c/d/i - wicks out.  No incarcerated hernias.   Ext:  SCDs BLE.  No mjr edema.  No cyanosis.  Mild edema R upper arm at old IV site.  No cellulitis Skin: No petechiae / purpurae  Problem List:  Principal Problem:  *Colonic stricture Active Problems:  Pure hypercholesterolemia  HYPERLIPIDEMIA  DEPRESSION, CHRONIC  Legal blindness  HYPERTENSION  CORONARY ARTERY DISEASE  EMPHYSEMA  BOOP (bronchiolitis obliterans with organizing pneumonia)  CONSTIPATION, CHRONIC  Irritable bowel syndrome  Systemic sclerosis  Diverticulosis of colon  Diabetes mellitus  Anemia of chronic disease  Postoperative anemia  Acute respiratory failure with hypoxia  Postinflammatory pulmonary fibrosis  Acute and chronic respiratory failure following trauma and surgery  Chronic Confusional State  Postoperative anemia   Assessment  [name]  78 y.o. female  3 Days Post-Op  Procedure(s): LAPAROSCOPIC PARTIAL COLECTOMY PROCTOSCOPY LAPAROSCOPIC LYSIS OF ADHESIONS  Stabilizing  Plan:  -Still with baseline confusion and agitation making a mild struggle to manage the patient. No severe issues at this time.  -prob HH, possible SNF  -adv diet to solids  -anemia - follow. Transfuse only if <6.5 or worsening hypoxia.  Hold on Ticlid until Hgb stable x2 days.  Stop all anticoag for now. Add PO iron. MVI has vitC  -low K - replace & check Mag  -steroid taper for pulmon issues.  Pulmonary help appreciated -f/u path -HTN control better w oral amlodipine, PRN hydralazine -VTE prophylaxis- SCDs, etc -mobilize as tolerated to help recovery.  PT/OT evals -  [name] Sportsman, M.D., F.A.C.S. Gastrointestinal and Minimally Invasive Surgery [facility] [address] [place], [place] [address] [phone] Main / Paging [phone] Voice Mail   03/21/2012  CARE TEAM:  PCP: Rama (Loreen Freud, MD  Outpatient  Care Team: Patient Care Team: Rama Georgianne Fick) Sofie Hartigan as PCP - General (Internal Medicine) Mardella Layman, MD as Consulting Physician (Gastroenterology) Coralyn Helling, MD as Consulting Physician (Pulmonary Disease) Pricilla Riffle, MD as Consulting Physician (Cardiology)  Inpatient Treatment Team: Treatment Team: Attending Provider: [name] Sportsman, MD;  Technician: Calton Golds, NT; Consulting Physician: Coralyn Helling, MD; Consulting Physician: Nyoka Cowden, MD; Registered Nurse: Gita Kudo Main, RN; Registered Nurse: Gerald Stabs, RN; Technician: Vella Raring, NT; Registered Nurse: Bethann Goo, RN; Technician: Harrie Jeans Debbink, NT   Results:   Labs: Results for orders placed during the hospital encounter of 03/17/12 (from the past 48 hour(s))  GLUCOSE, CAPILLARY     Status: Abnormal   Collection Time   03/19/12  7:36 AM      Component Value Range Comment   Glucose-Capillary 141 (*) 70 - 99 mg/dL    Comment 1 Documented in Chart      Comment 2 Notify RN     GLUCOSE, CAPILLARY     Status: Normal   Collection Time   03/19/12 12:21 PM      Component Value Range Comment   Glucose-Capillary 77  70 - 99 mg/dL   GLUCOSE, CAPILLARY     Status: Normal   Collection Time   03/19/12  5:02 PM      Component Value Range Comment   Glucose-Capillary 87  70 - 99 mg/dL   GLUCOSE, CAPILLARY     Status: Abnormal   Collection Time   03/19/12 10:09 PM      Component Value Range Comment   Glucose-Capillary 143 (*) 70 - 99 mg/dL   CBC     Status: Abnormal   Collection Time   03/20/12  5:10 AM      Component Value Range Comment   WBC 8.7  4.0 - 10.5 K/uL    RBC 3.05 (*) 3.87 - 5.11 MIL/uL    Hemoglobin 7.2 (*) 12.0 - 15.0 g/dL    HCT 45.4 (*) 09.8 - 46.0 %    MCV 76.4 (*) 78.0 - 100.0 fL    MCH 23.6 (*) 26.0 - 34.0 pg    MCHC 30.9  30.0 - 36.0 g/dL    RDW 11.9 (*) 14.7 - 15.5 %    Platelets 173  150 - 400 K/uL   POTASSIUM     Status: Normal   Collection Time   03/20/12  5:10 AM      Component Value Range Comment   Potassium 3.6  3.5 - 5.1 mEq/L   CREATININE, SERUM     Status: Abnormal   Collection Time   03/20/12  5:10 AM      Component Value Range Comment   Creatinine, Ser 0.77  0.50 - 1.10 mg/dL    GFR calc non Af Amer 78 (*) >90 mL/min    GFR calc Af Amer >90  >90 mL/min   GLUCOSE, CAPILLARY     Status:  Abnormal   Collection Time   03/20/12  8:35 AM      Component Value Range Comment   Glucose-Capillary 223 (*) 70 - 99 mg/dL    Comment 1 Notify RN      Comment 2 Documented in Chart     GLUCOSE, CAPILLARY     Status: Abnormal   Collection Time   03/20/12 11:53 AM      Component Value Range Comment   Glucose-Capillary 141 (*) 70 - 99 mg/dL    Comment 1  Notify RN      Comment 2 Documented in Chart     GLUCOSE, CAPILLARY     Status: Abnormal   Collection Time   03/20/12  4:54 PM      Component Value Range Comment   Glucose-Capillary 135 (*) 70 - 99 mg/dL   GLUCOSE, CAPILLARY     Status: Abnormal   Collection Time   03/20/12  9:52 PM      Component Value Range Comment   Glucose-Capillary 132 (*) 70 - 99 mg/dL    Comment 1 Notify RN     CBC     Status: Abnormal   Collection Time   03/21/12  4:33 AM      Component Value Range Comment   WBC 8.1  4.0 - 10.5 K/uL    RBC 2.86 (*) 3.87 - 5.11 MIL/uL    Hemoglobin 6.8 (*) 12.0 - 15.0 g/dL    HCT 78.2 (*) 95.6 - 46.0 %    MCV 75.2 (*) 78.0 - 100.0 fL    MCH 23.8 (*) 26.0 - 34.0 pg    MCHC 31.6  30.0 - 36.0 g/dL    RDW 21.3 (*) 08.6 - 15.5 %    Platelets 193  150 - 400 K/uL   POTASSIUM     Status: Abnormal   Collection Time   03/21/12  4:33 AM      Component Value Range Comment   Potassium 2.8 (*) 3.5 - 5.1 mEq/L   CREATININE, SERUM     Status: Abnormal   Collection Time   03/21/12  4:33 AM      Component Value Range Comment   Creatinine, Ser 0.68  0.50 - 1.10 mg/dL    GFR calc non Af Amer 81 (*) >90 mL/min    GFR calc Af Amer >90  >90 mL/min     Imaging / Studies: No results found.  Medications / Allergies: per chart  Antibiotics: Anti-infectives     Start     Dose/Rate Route Frequency Ordered Stop   03/18/12 1500   cefoTEtan (CEFOTAN) 1 g in dextrose 5 % 50 mL IVPB     Comments: Pharmacy may adjust dose strength for optimal dosing      1 g 100 mL/hr over 30 Minutes Intravenous Every 12 hours 03/18/12 1427 03/18/12 1606    03/18/12 0715   clindamycin (CLEOCIN) 900 mg, gentamicin (GARAMYCIN) 240 mg in sodium chloride 0.9 % 1,000 mL for intraperitoneal lavage  Status:  Discontinued         Irrigation To Surgery 03/18/12 0707 03/19/12 1246   03/18/12 0454   cefoTEtan (CEFOTAN) 2 g in dextrose 5 % 50 mL IVPB     Comments: Pharmacy may adjust dose strength for optimal dosing      2 g 100 mL/hr over 30 Minutes Intravenous On call to O.R. 03/18/12 5784 03/18/12 0745

## 2012-03-21 NOTE — Progress Notes (Signed)
IV nurse here to start peripheral IV for patient. Attempted twice with no success.No other IV nurse on campus at this time to try, when another IV nurse gets here she will send them to attempt restart. Will continue to monitor patient. CLum Keas

## 2012-03-21 NOTE — Progress Notes (Signed)
Physical Therapy Treatment Patient Details Name: [name] MRN: [id number] DOB: June 22, 1931 Today's Date: 03/21/2012 Time: 7829-5621 PT Time Calculation (min): 28 min  PT Assessment / Plan / Recommendation Comments on Treatment Session  78 y.o. female admitted to [facility] s/p laparoscopic partial colectomy.  Today her Hgb is 6.8 and due to no IV access (among other reasons) she is being treated conservatively with iron supplementation.  Pt is symptomatic, but able to participate with PT as long as we take it slow and she was closely monitored throughout the session.  Increased DOE with gait, but O2 sats remained in the mid 90s.  Gait speed is very slow putting her at risk for falls despite the fact that she was only supervision for gait with RW.      Follow Up Recommendations  Home health PT;Supervision/Assistance - 24 hour    Barriers to Discharge  NA      Equipment Recommendations  None recommended by PT    Recommendations for Other Services  NA  Frequency Min 3X/week   Plan Discharge plan remains appropriate    Precautions / Restrictions Precautions Precautions: Fall Precaution Comments: abdominal surgery. Monitor sats, h/o COPD   Pertinent Vitals/Pain O2 sats mid 90s during gait despite DOE 3/4, 3 standing rest breaks needed.  No reports of pain.      Mobility  Bed Mobility Rolling Left: 6: Modified independent (Device/Increase time);With rail Left Sidelying to Sit: 6: Modified independent (Device/Increase time);With rails Sit to Supine: HOB elevated;With rail;6: Modified independent (Device/Increase time) Details for Bed Mobility Assistance: relied heavily on rail, but no outside support needed to get into or out of bed.   Transfers Transfers: Sit to Stand;Stand to Sit Sit to Stand: From bed;With upper extremity assist;4: Min guard Stand to Sit: 4: Min guard;With upper extremity assist;To bed Details for Transfer Assistance: min guard assist to get to stadning to steady  pt during transition to RW.   Ambulation/Gait Ambulation/Gait Assistance: 5: Supervision Ambulation Distance (Feet): 450 Feet Assistive device: Rolling walker Ambulation/Gait Assistance Details: 3 standing rest breaks due to increased DOE.  O2 sats stayed in the mid 90s with gait despite dyspnea.   Gait Pattern: Decreased stride length;Step-through pattern Gait velocity: 0.86 ft/sec (<1.8 ft/sec indicates risk for recurrent falls).        PT Goals Acute Rehab PT Goals PT Goal: Supine/Side to Sit - Progress: Progressing toward goal PT Goal: Sit to Supine/Side - Progress: Progressing toward goal Pt will go Sit to Stand: with modified independence;with upper extremity assist PT Goal: Sit to Stand - Progress: Updated due to goal met Pt will go Stand to Sit: with modified independence;with upper extremity assist PT Goal: Stand to Sit - Progress: Updated due to goals met Pt will Ambulate: >150 feet;with modified independence;with least restrictive assistive device PT Goal: Ambulate - Progress: Updated due to goal met  Visit Information  Last PT Received On: 03/21/12 Assistance Needed: +1    Subjective Data  Subjective: Pt reports generally feeling weak and SOB.  HGB is 6.8, but pt has no IV access, so conservative treatment is being persued with iron pills.  Pt is ambulating in room with RW and did want to go on a walk around the unit. I kept close monitor on her due to her low HGB levels.           End of Session PT - End of Session Activity Tolerance: Patient limited by fatigue;Treatment limited secondary to medical complications (Comment) (low Hgb)  Patient left: in bed;with call bell/phone within reach       Winchester B. Gwendalynn Eckstrom, PT, DPT [phone]   03/21/2012, 4:15 PM

## 2012-03-21 NOTE — Progress Notes (Signed)
Occupational Therapy Note Chart reviewed. Note pt with 6.8 Hgb and per nursing, trying to get IV assess. Will try back at a later time. Judithann Sauger OTR/L [phone] 03/21/2012

## 2012-03-21 NOTE — Progress Notes (Signed)
Pharmacy Brief Note - Alvimopan (Entereg)  The standing order set for alvimopan (Entereg) now includes an automatic order to discontinue the drug after the patient has had a bowel movement.  The change was approved by the Pharmacy & Therapeutics Committee and the Medical Executive Committee.    This patient has had a bowel movement documented by nursing on 9/25.  Therefore, alvimopan has been discontinued.  If there are questions, please contact the pharmacy at [phone].  Thank you-  Bryon Parker, Loma Messing PharmD Pager #: [phone] 7:28 AM 03/21/2012

## 2012-03-21 NOTE — Progress Notes (Signed)
CSW assisting with d/c planning. Pt has chosen [facility] for [facility] following hospital d/c. SNF bed is available Fri/Sat. D/C Summary is needed Cale[facility] Essex if pt is to be d/c on Sat. CSW will follow to assist with d/c planning to SNF.  Cori Razor LCSW [phone]

## 2012-03-22 DIAGNOSIS — K5730 Diverticulosis of large intestine without perforation or abscess without bleeding: Secondary | ICD-10-CM | POA: Diagnosis not present

## 2012-03-22 DIAGNOSIS — J438 Other emphysema: Secondary | ICD-10-CM | POA: Diagnosis not present

## 2012-03-22 DIAGNOSIS — K56609 Unspecified intestinal obstruction, unspecified as to partial versus complete obstruction: Secondary | ICD-10-CM | POA: Diagnosis not present

## 2012-03-22 DIAGNOSIS — Z5189 Encounter for other specified aftercare: Secondary | ICD-10-CM | POA: Diagnosis not present

## 2012-03-22 DIAGNOSIS — N390 Urinary tract infection, site not specified: Secondary | ICD-10-CM | POA: Diagnosis not present

## 2012-03-22 DIAGNOSIS — L0291 Cutaneous abscess, unspecified: Secondary | ICD-10-CM | POA: Diagnosis not present

## 2012-03-22 DIAGNOSIS — R109 Unspecified abdominal pain: Secondary | ICD-10-CM | POA: Diagnosis not present

## 2012-03-22 DIAGNOSIS — K5900 Constipation, unspecified: Secondary | ICD-10-CM | POA: Diagnosis not present

## 2012-03-22 DIAGNOSIS — I10 Essential (primary) hypertension: Secondary | ICD-10-CM | POA: Diagnosis not present

## 2012-03-22 DIAGNOSIS — E119 Type 2 diabetes mellitus without complications: Secondary | ICD-10-CM | POA: Diagnosis not present

## 2012-03-22 DIAGNOSIS — M349 Systemic sclerosis, unspecified: Secondary | ICD-10-CM | POA: Diagnosis not present

## 2012-03-22 DIAGNOSIS — E7800 Pure hypercholesterolemia, unspecified: Secondary | ICD-10-CM | POA: Diagnosis not present

## 2012-03-22 DIAGNOSIS — J449 Chronic obstructive pulmonary disease, unspecified: Secondary | ICD-10-CM | POA: Diagnosis not present

## 2012-03-22 DIAGNOSIS — B370 Candidal stomatitis: Secondary | ICD-10-CM | POA: Diagnosis not present

## 2012-03-22 DIAGNOSIS — F329 Major depressive disorder, single episode, unspecified: Secondary | ICD-10-CM | POA: Diagnosis not present

## 2012-03-22 DIAGNOSIS — H543 Unqualified visual loss, both eyes: Secondary | ICD-10-CM | POA: Diagnosis not present

## 2012-03-22 DIAGNOSIS — E785 Hyperlipidemia, unspecified: Secondary | ICD-10-CM | POA: Diagnosis not present

## 2012-03-22 DIAGNOSIS — T8189XA Other complications of procedures, not elsewhere classified, initial encounter: Secondary | ICD-10-CM | POA: Diagnosis not present

## 2012-03-22 DIAGNOSIS — J8409 Other alveolar and parieto-alveolar conditions: Secondary | ICD-10-CM | POA: Diagnosis not present

## 2012-03-22 DIAGNOSIS — K589 Irritable bowel syndrome without diarrhea: Secondary | ICD-10-CM | POA: Diagnosis not present

## 2012-03-22 DIAGNOSIS — C187 Malignant neoplasm of sigmoid colon: Secondary | ICD-10-CM | POA: Diagnosis not present

## 2012-03-22 DIAGNOSIS — I2510 Atherosclerotic heart disease of native coronary artery without angina pectoris: Secondary | ICD-10-CM | POA: Diagnosis not present

## 2012-03-22 LAB — CBC
HCT: 21.7 % — ABNORMAL LOW (ref 36.0–46.0)
Hemoglobin: 6.8 g/dL — CL (ref 12.0–15.0)
MCH: 23.2 pg — ABNORMAL LOW (ref 26.0–34.0)
MCHC: 30.9 g/dL (ref 30.0–36.0)
MCV: 75.1 fL — ABNORMAL LOW (ref 78.0–100.0)
Platelets: 241 10*3/uL (ref 150–400)
RBC: 2.89 MIL/uL — ABNORMAL LOW (ref 3.87–5.11)
RDW: 15.8 % — ABNORMAL HIGH (ref 11.5–15.5)
WBC: 7.7 10*3/uL (ref 4.0–10.5)

## 2012-03-22 LAB — POTASSIUM: Potassium: 3.4 mEq/L — ABNORMAL LOW (ref 3.5–5.1)

## 2012-03-22 LAB — GLUCOSE, CAPILLARY
Glucose-Capillary: 128 mg/dL — ABNORMAL HIGH (ref 70–99)
Glucose-Capillary: 160 mg/dL — ABNORMAL HIGH (ref 70–99)

## 2012-03-22 LAB — CREATININE, SERUM
Creatinine, Ser: 0.72 mg/dL (ref 0.50–1.10)
GFR calc Af Amer: >90 mL/min (ref >90)
GFR calc non Af Amer: 80 mL/min — ABNORMAL LOW (ref >90)

## 2012-03-22 LAB — MAGNESIUM: Magnesium: 1.5 mg/dL (ref 1.5–2.5)

## 2012-03-22 MED ORDER — FERROUS SULFATE 325 (65 FE) MG PO TABS: 325.0000 mg | ORAL_TABLET | Freq: Two times a day (BID) | ORAL | Status: DC

## 2012-03-22 MED ORDER — POTASSIUM CHLORIDE CRYS ER 20 MEQ PO TBCR
40.0000 meq | EXTENDED_RELEASE_TABLET | Freq: Every day | ORAL | Status: DC
  Administered 2012-03-22: 40 meq via ORAL
  Filled 2012-03-22: qty 2

## 2012-03-22 MED ORDER — POTASSIUM CHLORIDE CRYS ER 20 MEQ PO TBCR: 40.0000 meq | EXTENDED_RELEASE_TABLET | Freq: Every day | ORAL | Status: DC

## 2012-03-22 NOTE — Progress Notes (Signed)
Department CLINICAL SOCIAL WORK PLACEMENT NOTE 03/22/2012  Patient:  [name], [name]  Account Number:  [id number] Admit date:  03/17/2012  Clinical Social Worker:  Cori Razor, LCSW  Date/time:  03/20/2012 02:20 PM  Clinical Social Work is seeking post-discharge placement for this patient at the following level of care:   SKILLED NURSING   (*CSW will update this form in Epic as items are completed)   03/20/2012  Patient/family provided with [facility] Department of Clinical Social Work's list of facilities offering this level of care within the geographic area requested by the patient (or if unable, by the patient's family).  03/20/2012  Patient/family informed of their freedom to choose among providers that offer the needed level of care, that participate in Medicare, Medicaid or managed care program needed by the patient, have an available bed and are willing to accept the patient.    Patient/family informed of [facility]' ownership interest in [facility], as well as of the fact that they are under no obligation to receive care at this facility.  PASARR submitted to EDS on 03/20/2012 PASARR number received from EDS on 03/20/2012  FL2 transmitted to all facilities in geographic area requested by pt/family on  03/20/2012 FL2 transmitted to all facilities within larger geographic area on   Patient informed that his/her managed care company has contracts with or will negotiate with  certain facilities, including the following:     Patient/family informed of bed offers received:  03/20/2012 Patient chooses bed at [facility], [place] Physician recommends and patient chooses bed at    [facility] [place] on  03/22/2012 Patient to be transferred to facility by [facility]  The following physician request were entered in Epic:   Additional Comments:  Cori Razor LCSW [phone]

## 2012-03-22 NOTE — Progress Notes (Signed)
Report called to [facility] talked to Tarri Abernethy RN.

## 2012-03-22 NOTE — Discharge Summary (Signed)
Physician Discharge Summary  Patient ID: [name] MRN: [id number] DOB/AGE: March 02, 1931 78 y.o.  Admit date: 03/17/2012 Discharge date: 03/22/2012  Admission Diagnoses: Principal Problem:  *Colonic stricture Active Problems:  Pure hypercholesterolemia  HYPERLIPIDEMIA  DEPRESSION, CHRONIC  Legal blindness  HYPERTENSION  CORONARY ARTERY DISEASE  EMPHYSEMA  BOOP (bronchiolitis obliterans with organizing pneumonia)  CONSTIPATION, CHRONIC  Irritable bowel syndrome  Systemic sclerosis  Diverticulosis of colon  Diabetes mellitus  Anemia of chronic disease  Postoperative anemia  Acute respiratory failure with hypoxia  Postinflammatory pulmonary fibrosis  Acute and chronic respiratory failure following trauma and surgery  Chronic Confusional State  Discharge Diagnoses:  Principal Problem:  *Colonic stricture Active Problems:  Pure hypercholesterolemia  HYPERLIPIDEMIA  DEPRESSION, CHRONIC  Legal blindness  HYPERTENSION  CORONARY ARTERY DISEASE  EMPHYSEMA  BOOP (bronchiolitis obliterans with organizing pneumonia)  CONSTIPATION, CHRONIC  Irritable bowel syndrome  Systemic sclerosis  Diverticulosis of colon  Diabetes mellitus  Anemia of chronic disease  Postoperative anemia  Acute respiratory failure with hypoxia  Postinflammatory pulmonary fibrosis  Acute and chronic respiratory failure following trauma and surgery  Chronic Confusional State  Postoperative anemia   Discharged Condition: good  Hospital Course:   Pt underwent lap assisted resection of the chronically scarred & strictured rectosigmoid colon.  She was placed in the ICU per Pulmonary pre-op recs.  Was able to be weaned off of oxygen quickly.  Postoperatively, the patient was placed on an anti-ileus protocol.  The patient mobilized and advanced to a solid diet gradually.  Pain was well-controlled and transitioned off IV medications.  Pt insisted on a PICC line but then it infiltrated & she pulled out  IVs x2 due to comfusion that cleared up by the end of admission.  By the time of discharge, the patient was walking well the hallways, eating food well, having flatus.  Standing at sink able to bathe herself with minimal assistance.  Much more calm & oriented.  Pain was-controlled on an oral regimen.  Based on meeting DC criteria and recovering well, I felt it was safe for the patient to be discharged with close followup.  Instructions were discussed in detail.  They are written as well.  Pt wished SNF placement to help w recovery & PT/OT felt that was reasonable.     Consults: Pulmonary  Significant Diagnostic Studies:   Treatments: surgery: Lap LOA & LAR  REPORT OF SURGICAL PATHOLOGY FINAL DIAGNOSIS Diagnosis Colon, segmental resection, recto-sigmoid - BENIGN COLORECTAL MUCOSA WITH SEROSAL ADHESIONS AND STRICTURE FORMATION. - TWO BENIGN LYMPH NODES (0/2). - NO TUMOR SEEN. - SEE COMMENT. Microscopic Comment Sections show benign colorectal mucosa with increased muscularis propria thickness and areas of grossly identifiable stricture formation. Adhesion formation is also identified. There is no tumor seen. (RAH:caf 03/20/12) Renee Abts MD Pathologist, Electronic Signature (Case signed 03/20/2012) Specimen [name] and Clinical Information Specimen(s) Obtained: Colon, segmental resection, recto-sigmoid Specimen Clinical Information colon stricture, diverticulitis [jl] [name] Received in fixative is an oriented segment of clinically stated rectosigmoid colon. A stitch is present at the distal end, per specimen requisition. The specimen measures 28 cm in length. The serosa is pink tan, dull, dusky, with numerous serosal adhesions. There is a moderate amount of adherent yellow lobulated pericolonic and mesenteric adipose tissue radiating up to 6 cm. The specimen is opened along is length revealing a wall thickness measuring 0.5 cm. The mucosa is pink tan, velvety and normally folded.  No mucosal lesions are noted grossly. There is an area located 11 cm  from the distal resection margin in which the luminal diameter decreases from 2 cm to approximately 1 cm and remains that way all the way to the distal margin. A small kink is present in the specimen at this area however, no mucosal or serosal lesions are noted. Sections through the specimen reveal no diverticuli. The wall appears moderately thickened in the 1 of 2 FINAL for Renee, [name] [phone]) [name](continued) area where the luminal diameter is decreased. Sections through the adherent pericolonic adipose tissue reveals no suspicious foci of firmness or nodularity. Several unremarkable lymph nodes are identified measuring up to 0.3 cm. Representative sections are submitted. A = sections from proximal end of specimen B = sections from distal end of specimen with decreased luminal diameter C = sections from small kink in the specimen D = three possible lymph nodes Total = four blocks. (JC:kh 03-20-12) Report signed out from the following location(s) [facility] [address],[place] [address].CLIA:[id number],[id number]., [facility] [address], [place], [place] [address]. CLIA #: [id number], 2 of  Discharge Exam: Blood pressure 151/70, pulse 89, temperature 98.9 F (37.2 C), temperature source Oral, resp. rate 18, height 5\' 2"  (1.575 m), weight 133 lb 2.5 oz (60.4 kg), SpO2 95.00%.  General: Pt awake/alert/oriented x4 in no acute distress  Eyes: PERRL, normal EOM. Sclera clear. No icterus  Neuro: CN II-XII intact w/o focal sensory/motor deficits.  Lymph: No head/neck/groin lymphadenopathy  Psych: No delerium/psychosis/paranoia. Recall and memory poor but her baseline. Not particularly combative but mildly irritated  HENT: Normocephalic, Mucus membranes moist. No thrush  Neck: Supple, No tracheal deviation  Chest: No chest wall pain w good excursion. Dec BS at  base  CV: Pulses intact. Regular rhythm  Abdomen: Soft. Nondistended. Nontender. Incisions c/d/i - wicks out. No incarcerated hernias.  Ext: SCDs BLE. No mjr edema. No cyanosis. Mild edema R upper arm at old IV site. No cellulitis  Skin: No petechiae / purpurae   Disposition: 01-Home or Self Care     Medication List     As of 03/22/2012  7:18 AM    TAKE these medications         amLODipine 10 MG tablet   Commonly known as: NORVASC   Take 10 mg by mouth every morning. Once a day      aspirin 325 MG tablet   Take 325 mg by mouth 2 (two) times daily.      beta carotene w/minerals tablet   Take 1 tablet by mouth daily.      esomeprazole 40 MG capsule   Commonly known as: NEXIUM   Take 1 capsule (40 mg total) by mouth daily before breakfast.      imipramine 25 MG tablet   Commonly known as: TOFRANIL   Take 75 mg by mouth at bedtime.      insulin lispro 100 UNIT/ML injection   Commonly known as: HUMALOG   Inject 5 Units into the skin 2 (two) times daily. DO NOT SUBSTITUTE PER PATIENT REQUEST      levothyroxine 125 MCG tablet   Commonly known as: SYNTHROID, LEVOTHROID   Take 125 mcg by mouth every morning.      lubiprostone 24 MCG capsule   Commonly known as: AMITIZA   Take 24 mcg by mouth every morning.      mometasone 50 MCG/ACT nasal spray   Commonly known as: NASONEX   Place 2 sprays into the nose daily. As needed for congestion.  predniSONE 5 MG tablet   Commonly known as: DELTASONE   Take 5 mg by mouth daily.      ticlopidine 250 MG tablet   Commonly known as: TICLID   Take 1 tablet (250 mg total) by mouth 2 (two) times daily.      traMADol 50 MG tablet   Commonly known as: ULTRAM   Take 1-2 tablets (50-100 mg total) by mouth every 6 (six) hours as needed for pain.      triazolam 0.25 MG tablet   Commonly known as: HALCION   Take 0.25 mg by mouth at bedtime as needed. For sleep           Follow-up Information    Follow up with Lyndel Sarate C.,  MD. Schedule an appointment as soon as possible for a visit in 2 weeks.   Contact information:   [address] [place] [address] [phone]          Signed: [name] Sportsman. 03/22/2012, 7:18 AM

## 2012-03-25 ENCOUNTER — Telehealth (INDEPENDENT_AMBULATORY_CARE_PROVIDER_SITE_OTHER): Payer: Self-pay | Admitting: General Surgery

## 2012-03-25 NOTE — Telephone Encounter (Signed)
LMOM to let patient know her follow up appt is scheduled 04/04/12 @ 11:45 am.

## 2012-03-26 ENCOUNTER — Telehealth (INDEPENDENT_AMBULATORY_CARE_PROVIDER_SITE_OTHER): Payer: Self-pay | Admitting: General Surgery

## 2012-03-26 DIAGNOSIS — J449 Chronic obstructive pulmonary disease, unspecified: Secondary | ICD-10-CM | POA: Diagnosis not present

## 2012-03-26 DIAGNOSIS — K5900 Constipation, unspecified: Secondary | ICD-10-CM | POA: Diagnosis not present

## 2012-03-26 DIAGNOSIS — B370 Candidal stomatitis: Secondary | ICD-10-CM | POA: Diagnosis not present

## 2012-03-26 DIAGNOSIS — I2510 Atherosclerotic heart disease of native coronary artery without angina pectoris: Secondary | ICD-10-CM | POA: Diagnosis not present

## 2012-03-26 DIAGNOSIS — L0291 Cutaneous abscess, unspecified: Secondary | ICD-10-CM | POA: Diagnosis not present

## 2012-03-26 DIAGNOSIS — I10 Essential (primary) hypertension: Secondary | ICD-10-CM | POA: Diagnosis not present

## 2012-03-26 DIAGNOSIS — K56609 Unspecified intestinal obstruction, unspecified as to partial versus complete obstruction: Secondary | ICD-10-CM | POA: Diagnosis not present

## 2012-03-26 NOTE — Telephone Encounter (Signed)
Pt's caregiver at [facility] called to ask advice on no bowel movement.  Gave small enema last night with small results.  Recommended pt walk as much as possible, try Miralax 1-2 capfuls in 10 oz water QD to BID or MOM.  She passing gas, but has decreased bowel sounds.  They will call back if this is ineffective.

## 2012-03-28 DIAGNOSIS — T8189XA Other complications of procedures, not elsewhere classified, initial encounter: Secondary | ICD-10-CM | POA: Diagnosis not present

## 2012-03-28 DIAGNOSIS — K5900 Constipation, unspecified: Secondary | ICD-10-CM | POA: Diagnosis not present

## 2012-03-31 DIAGNOSIS — N390 Urinary tract infection, site not specified: Secondary | ICD-10-CM | POA: Diagnosis not present

## 2012-03-31 DIAGNOSIS — T8189XA Other complications of procedures, not elsewhere classified, initial encounter: Secondary | ICD-10-CM | POA: Diagnosis not present

## 2012-04-02 ENCOUNTER — Encounter (INDEPENDENT_AMBULATORY_CARE_PROVIDER_SITE_OTHER): Payer: Self-pay | Admitting: Surgery

## 2012-04-02 ENCOUNTER — Ambulatory Visit (INDEPENDENT_AMBULATORY_CARE_PROVIDER_SITE_OTHER): Payer: Medicare Other | Admitting: Surgery

## 2012-04-02 VITALS — BP 138/74 | HR 80 | Temp 97.9°F | Resp 14 | Ht 62.5 in | Wt 131.4 lb

## 2012-04-02 DIAGNOSIS — K56609 Unspecified intestinal obstruction, unspecified as to partial versus complete obstruction: Secondary | ICD-10-CM

## 2012-04-02 DIAGNOSIS — K56699 Other intestinal obstruction unspecified as to partial versus complete obstruction: Secondary | ICD-10-CM

## 2012-04-02 DIAGNOSIS — K5730 Diverticulosis of large intestine without perforation or abscess without bleeding: Secondary | ICD-10-CM

## 2012-04-02 NOTE — Patient Instructions (Signed)
Leave packing in wound until OV on Thursday, 10/10, at [facility].  Continue on Levaquin.  Velora Heckler, MD, [facility] Office: [phone]

## 2012-04-02 NOTE — Progress Notes (Signed)
General Surgery [facility]  Visit Diagnoses: 1. Colonic stricture   2. Diverticulosis of colon     HISTORY: Patient is a 78 year old white female who underwent low anterior resection by Dr. Estelle Grumbles 2 weeks ago.  She is currently a resident of a nursing care facility. She has developed some drainage from her lower abdominal incision. The nursing home physician called should the wound and it is growing Pseudomonas which is sensitive to Levaquin. She is on that antibiotic at this time. She also complains of tenderness at the left side of her incision.  EXAM: Surgical incision is a low transverse wound. There is erythema at the right most extent. This is probed with a Q-tip into the subcutaneous tissues and a small amount of cloudy yellowish drainage is evacuated. Wound is packed with quarter-inch plain gauze. Dry gauze dressing is placed. The left most extent of the incision is somewhat edematous. There is no erythema. There is no fluctuance. It is markedly tender to palpation. I do not find evidence of incisional hernia on examination today.  IMPRESSION: Superficial wound infection  PLAN: Patient will continue on Levaquin as the sensitivities from the cultures show that that is an appropriate choice. She will return in 48 hours for wound check by Dr. Estelle Grumbles. He will remove the packing. I will also ask him to evaluate the surgical wound for possible early incisional hernia.  Velora Heckler, MD, FACS General & Endocrine Surgery [facility]

## 2012-04-04 ENCOUNTER — Ambulatory Visit (INDEPENDENT_AMBULATORY_CARE_PROVIDER_SITE_OTHER): Payer: Medicare Other | Admitting: Surgery

## 2012-04-04 ENCOUNTER — Encounter (INDEPENDENT_AMBULATORY_CARE_PROVIDER_SITE_OTHER): Payer: Self-pay | Admitting: Surgery

## 2012-04-04 VITALS — BP 120/58 | HR 68 | Temp 97.8°F | Resp 16 | Ht 62.0 in | Wt 129.2 lb

## 2012-04-04 DIAGNOSIS — K56609 Unspecified intestinal obstruction, unspecified as to partial versus complete obstruction: Secondary | ICD-10-CM

## 2012-04-04 DIAGNOSIS — K56699 Other intestinal obstruction unspecified as to partial versus complete obstruction: Secondary | ICD-10-CM

## 2012-04-04 NOTE — Progress Notes (Signed)
Subjective:     Patient ID: [name], female   DOB: 04/28/1931, 78 y.o.   MRN: [id number]  HPI  [name]  September 06, 1930 [id number]  Patient Care Team: Rama (Georgianne Fick) Sofie Hartigan as PCP - General (Internal Medicine) Mardella Layman, MD as Consulting Physician (Gastroenterology) Coralyn Helling, MD as Consulting Physician (Pulmonary Disease) Pricilla Riffle, MD as Consulting Physician (Cardiology) Barbette Or, MD as Consulting Physician (Internal Medicine)  This patient is a 78 y.o.female who presents today for surgical evaluation.   Procedure: Laparoscopically assisted rectosigmoid resection of chronic stricture 9/23 2013  Patient comes by herself.  Annoyed that she is nauseated.  Claims they cannot give her supplemental shakes.  Claims she has lost 8 pounds.  She is within the 5 pound range for the past year.  Feeling more nauseated today.  No fevers or chills.  Claims to be moving bowels.  No difficulty with urination.  Was found to have a Pseudomonas urinary tract infection.  Continue sun Levaquin.  Was found to have wound drainage and open up a small abscess on the right corner of her incision.  Shonna Chock remains in place.  She does not know if any dressing changes have occurred.  Energy level down a little bit but did not does confess that it is getting a little better.  Patient Active Problem List  Diagnosis  . Pure hypercholesterolemia  . HYPERLIPIDEMIA  . DEPRESSION, CHRONIC  . Legal blindness  . HYPERTENSION  . CORONARY ARTERY DISEASE  . Chronic rhinitis  . EMPHYSEMA  . BOOP (bronchiolitis obliterans with organizing pneumonia)  . ZENKER'S DIVERTICULUM  . CONSTIPATION, CHRONIC  . Irritable bowel syndrome  . Systemic sclerosis  . Colonic stricture  . Diverticulosis of colon  . Diabetes mellitus  . Anemia of chronic disease  . Postoperative anemia  . Acute respiratory failure with hypoxia  . Postinflammatory pulmonary fibrosis  . Acute and chronic  respiratory failure following trauma and surgery  . Chronic confusional state  . Postoperative anemia    Past Medical History  Diagnosis Date  . Coronary artery disease   . Dressler's syndrome   . Hypertension   . Dyslipidemia   . Diabetes mellitus   . CVA (cerebral infarction) 1991  . Macular degeneration   . GERD (gastroesophageal reflux disease)   . Diverticulosis   . IBS (irritable bowel syndrome)   . Zenker's diverticulum   . Emphysema   . Pulmonary hypertension   . Chronic sinusitis   . Fibromyalgia   . Hypothyroidism   . Sjogren's disease   . Raynaud's phenomenon   . Cervical disc disease   . Adenomatous colon polyp   . Scleroderma   . COPD (chronic obstructive pulmonary disease)   . Arthritis   . Blood transfusion   . Myocardial infarction may 27th 2007  . Pneumonia      HX PNEUMONIA  . Depression   . 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 Cancer     cervical cancer pre hysterectomy  . Sleep apnea     STOP BANG SCORE 5  . Legally blind     Past Surgical History  Procedure Date  . Total abdominal hysterectomy 1973  . Bilateral oophorectomy 2002  . Spinal fusion 1997    and implantation of a plate   . Lobe thyroid removal   . Thyroid lobectomy 1991    removal of left lobe thyroid  . Cholecystectomy 1965  .  Appendectomy   . Cataract extraction   . Bronchoscopy 02-22-09  . Stented cardiac  artery 2007 / 2007 / 2010    X 3 STENT  . Proctoscopy 03/18/2012    Procedure: PROCTOSCOPY;  Surgeon: [name] Sportsman, MD;  Location: [facility];  Service: General;  Laterality: N/A;  rigid procto    History   Social History  . Marital Status: Divorced    Spouse Name: N/A    Number of Children: N/A  . Years of Education: N/A   Occupational History  . retired    Social History Main Topics  . Smoking status: Former Smoker -- 1.0 packs/day for 40 years    Types: Cigarettes    Quit date: 06/26/1988  . Smokeless tobacco: Never Used   Comment:  40 pack years  . Alcohol Use: No  . Drug Use: No  . Sexually Active: Not on file   Other Topics Concern  . Not on file   Social History Narrative  . No narrative on file    Family History  Problem Relation Age of Onset  . Other Mother 46    Died from sepsis  . Stroke Father 39    died  . Heart disease Father   . Cancer Sister     lung  . Cancer Brother     lung    Current Outpatient Prescriptions  Medication Sig Dispense Refill  . amLODipine (NORVASC) 10 MG tablet Take 10 mg by mouth every morning. Once a day      . aspirin 325 MG tablet Take 325 mg by mouth 2 (two) times daily.       . beta carotene w/minerals (OCUVITE) tablet Take 1 tablet by mouth daily.      [name] doxycycline (DORYX) 100 MG EC tablet Take 100 mg by mouth 2 (two) times daily.      [name] esomeprazole (NEXIUM) 40 MG capsule Take 1 capsule (40 mg total) by mouth daily before breakfast.  30 capsule  6  . ferrous sulfate 325 (65 FE) MG tablet Take 1 tablet (325 mg total) by mouth 2 (two) times daily with a meal.  60 tablet  1  . imipramine (TOFRANIL) 25 MG tablet Take 75 mg by mouth at bedtime.       . insulin lispro (HUMALOG) 100 UNIT/ML injection Inject 5 Units into the skin 2 (two) times daily. DO NOT SUBSTITUTE PER PATIENT REQUEST      . levofloxacin (LEVAQUIN) 500 MG tablet Take 500 mg by mouth daily.      [name] levothyroxine (SYNTHROID, LEVOTHROID) 125 MCG tablet Take 125 mcg by mouth every morning.       . lubiprostone (AMITIZA) 24 MCG capsule Take 24 mcg by mouth every morning.      . mometasone (NASONEX) 50 MCG/ACT nasal spray Place 2 sprays into the nose daily. As needed for congestion.      . potassium chloride SA (K-DUR,KLOR-CON) 20 MEQ tablet Take 2 tablets (40 mEq total) by mouth daily.  5 tablet  0  . predniSONE (DELTASONE) 5 MG tablet Take 5 mg by mouth daily.      . ticlopidine (TICLID) 250 MG tablet Take 1 tablet (250 mg total) by mouth 2 (two) times daily.  60 tablet  5  . traMADol (ULTRAM) 50 MG tablet  Take 1-2 tablets (50-100 mg total) by mouth every 6 (six) hours as needed for pain.  40 tablet  1  . triazolam (HALCION) 0.25 MG tablet Take 0.25  mg by mouth at bedtime as needed. For sleep         Allergies  Allergen Reactions  . Lantus (Insulin Glargine)     Patient states "sulfate binder causes sinus infection/pneumonia".  . Metoprolol-Hydrochlorothiazide     REACTION: decreased bp  . Novolog (Insulin Aspart)     Patient states "sulfate binder causes sinus infection/pneumonia". Tolerates Humalog.  . Sulfonamide Derivatives Hives  . Titanium     Cervical plate - had to change for stainless steel  . Adhesive (Tape) Hives and Rash    BP 120/58  Pulse 68  Temp 97.8 F (36.6 C) (Temporal)  Resp 16  Ht 5\' 2"  (1.575 m)  Wt 129 lb 4 oz (58.627 kg)  BMI 23.64 kg/m2  Dg Chest 2 View  03/13/2012  *RADIOLOGY REPORT*  Clinical Data: Cough.  Follow-up for pneumonia.  CHEST - 2 VIEW  Comparison: Chest x-ray 01/10/2012.  Findings: Lungs appear hyperexpanded with flattening of the hemidiaphragms, increased retrosternal air space and pruning of the pulmonary vasculature in the periphery, suggestive of underlying COPD.  No acute consolidative airspace disease.  No definite pleural effusions.  No evidence of pulmonary edema.  Heart size is normal.  Mediastinal contours are unremarkable.  Atherosclerosis in the thoracic aorta.  Surgical clips in the lower left cervical region, likely from prior thyroid surgery.  IMPRESSION: 1.  Chronic changes of COPD redemonstrated, without evidence of acute cardiopulmonary disease. 2.  Atherosclerosis   Original Report Authenticated By: Florencia Reasons, M.D.    Dg Chest Port 1 View  03/19/2012  *RADIOLOGY REPORT*  Clinical Data: Atelectasis.  PORTABLE CHEST - 1 VIEW  Comparison: 03/13/2012.  Findings: Worsening pulmonary aeration compared yesterday's exam. There is no interstitial prominence, which at the bases appears representing combination of atelectasis and  probable interstitial pulmonary edema.  Lung volumes are lower than on prior.  There may be a small left pleural effusion.  Cardiopericardial silhouette is unchanged. Monitoring leads are projected over the chest.  IMPRESSION: Worsening pulmonary aeration, predominately due to atelectasis. There may be some interstitial pulmonary edema as well.  Probable small left pleural effusion.   Original Report Authenticated By: Andreas Newport, M.D.    Dg Abd Acute W/chest  03/17/2012  *RADIOLOGY REPORT*  Clinical Data: Abdominal pain.  ACUTE ABDOMEN SERIES (ABDOMEN 2 VIEW & CHEST 1 VIEW)  Comparison: No priors.  Findings: Mild diffusely increased interstitial markings are similar to prior examinations.  No acute consolidative airspace disease.  No pleural effusions.  Pulmonary vasculature and the cardiomediastinal silhouette are within normal limits. Atherosclerotic calcifications within the arch of the aorta. Surgical clips on the left side of the thoracic inlet, likely from prior thyroid surgery.  No pneumoperitoneum.  Supine and left lateral decubitus views of the abdomen demonstrate gas and stool scattered throughout the colon extending to the distal rectum.  No pathologic distension of small bowel.  Some small air fluid levels are noted on the lateral decubitus view, which are nonspecific.  Multiple pelvic phleboliths are noted, predominately on the left side of the pelvis.  IMPRESSION: 1.  Nonspecific, nonobstructive bowel gas pattern. 2.  No pneumoperitoneum. 3.  No radiographic evidence of acute cardiopulmonary disease. 4.  Atherosclerosis.   Original Report Authenticated By: Florencia Reasons, M.D.      Review of Systems  Constitutional: Negative for fever, chills and diaphoresis.  HENT: Negative for ear pain, sore throat and trouble swallowing.   Eyes: Negative for photophobia and visual disturbance.  Respiratory: Negative  for cough and choking.   Cardiovascular: Negative for chest pain and  palpitations.  Gastrointestinal: Positive for nausea. Negative for vomiting, abdominal pain, diarrhea, constipation, blood in stool, abdominal distention, anal bleeding and rectal pain.  Genitourinary: Negative for dysuria, frequency and difficulty urinating.  Musculoskeletal: Negative for myalgias and gait problem.  Skin: Positive for wound. Negative for color change, pallor and rash.  Neurological: Negative for dizziness, speech difficulty, weakness and numbness.  Hematological: Negative for adenopathy.  Psychiatric/Behavioral: Negative for confusion and agitation. The patient is not nervous/anxious.        Objective:   Physical Exam  Constitutional: She is oriented to person, place, and time. She appears well-developed and well-nourished. No distress.  HENT:  Head: Normocephalic.  Mouth/Throat: Oropharynx is clear and moist. No oropharyngeal exudate.  Eyes: Conjunctivae normal and EOM are normal. Pupils are equal, round, and reactive to light. No scleral icterus.  Neck: Normal range of motion. No tracheal deviation present.  Cardiovascular: Normal rate and intact distal pulses.   Pulmonary/Chest: Effort normal. No respiratory distress. She exhibits no tenderness.  Abdominal: Soft. She exhibits no distension. There is tenderness in the suprapubic area. No hernia. Hernia confirmed negative in the right inguinal area and confirmed negative in the left inguinal area.    Genitourinary: No vaginal discharge found.  Musculoskeletal: Normal range of motion. She exhibits no tenderness.  Lymphadenopathy:       Right: No inguinal adenopathy present.       Left: No inguinal adenopathy present.  Neurological: She is alert and oriented to person, place, and time. No cranial nerve deficit. She exhibits normal muscle tone. Coordination normal.  Skin: Skin is warm and dry. No rash noted. She is not diaphoretic. No pallor.  Psychiatric: She has a normal mood and affect. Her behavior is normal.        Assessment:     Wound infection, resolving  Mild chronic nausea    Plan:     Increase activity as tolerated.  Do not push through pain.  Advance diet as tolerated. Bowel regimen to avoid problems.  Metamucil BID, especially while on iron PO supp.  Supplemental shakes BID to help with recovery  Consider better nausea control.  Perhaps try Phenergan.  May be related to Levaquin.  Aggressive glucose control.  OK to stop Levaquin from wound standpoint, cont for UTI per PCP/Dr. Kevan Ny  Return to clinic 2 weeks to see if improving. The patient expressed understanding and appreciation

## 2012-04-04 NOTE — Patient Instructions (Addendum)
WOUND CARE  It is important that the wound be kept open.   -Keeping the skin edges apart will allow the wound to gradually heal from the base upwards.   - If the skin edges of the wound close too early, a new fluid pocket can form and infection can occur. -This is the reason to pack deeper wounds with gauze or ribbon -This is why drained wounds cannot be sewed closed right away  A healthy wound should form a lining of bright red "beefy" granulating tissue that will help shrink the wound and help the edges grow new skin into it.   -A little mucus / yellow discharge is normal (the body's natural way to try and form a scab) and should be gently washed off with soap and water with daily dressing changes.  -Green or foul smelling drainage implies bacterial colonization and can slow wound healing - a short course of antibiotic ointment (3-5 days) can help it clear up.  Call the doctor if it does not improve or worsens  -Avoid use of antibiotic ointments for more than a week as they can slow wound healing over time.    -Sometimes other wound care products will be used to reduce need for dressing changes and/or help clean up dirty wounds -Sometimes the surgeon needs to debride the wound in the office to remove dead or infected tissue out of the wound so it can heal more quickly and safely.    Change the dressing at least once a day -Wash the wound with mild soap and water gently every day.  It is good to shower or bathe the wound to help it clean out. -Use clean ribbon plain NU-gauze for smaller wounds (it does not need to be sterile, just clean) -Keep the raw wound moist with a little saline or KY (saline) gel on the gauze.  -A dry wound will take longer to heal.  -Keep the skin dry around the wound to prevent breakdown and irritation. -Pack the wound down to the base -The goal is to keep the skin apart, not overpack the wound -Use a Q-tip or blunt-tipped kabob stick toothpick to push the gauze down  to the base in narrow or deep wounds   -Cover with a clean gauze and tape -paper or Medipore tape tend to be gentle on the skin -rotate the orientation of the tape to avoid repeated stress/trauma on the skin -using an ACE or Coban wrap on wounds on arms or legs can be used instead.  Complete all antibiotics (LEVAQUIN) through the entire prescription to help the infection heal and prevent new places of infection   Returning the see the surgeon is helpful to follow the healing process and help the wound close as fast as possible.

## 2012-04-09 ENCOUNTER — Encounter (INDEPENDENT_AMBULATORY_CARE_PROVIDER_SITE_OTHER): Payer: Self-pay | Admitting: Surgery

## 2012-04-09 ENCOUNTER — Ambulatory Visit (INDEPENDENT_AMBULATORY_CARE_PROVIDER_SITE_OTHER): Payer: Medicare Other | Admitting: Surgery

## 2012-04-09 VITALS — BP 146/66 | HR 88 | Temp 97.1°F | Resp 16 | Ht 62.0 in | Wt 129.0 lb

## 2012-04-09 DIAGNOSIS — Z9889 Other specified postprocedural states: Secondary | ICD-10-CM

## 2012-04-09 NOTE — Patient Instructions (Signed)
Continue to change dressing as needed.

## 2012-04-09 NOTE — Progress Notes (Signed)
CC: Post op -? Of developing wound infection  HPI. Patient was here last week and wound probed, no abscess found. Continued discomfort, especially left lateral.  Exan VS:BP 146/66  Pulse 88  Temp 97.1 F (36.2 C) (Temporal)  Resp 16  Ht 5\' 2"  (1.575 m)  Wt 129 lb (58.514 kg)  BMI 23.59 kg/m2  Gen. Pt alert, NAD  Abd: wound firm but not fluctuant. Minimal erythema at right lateral edge. Probed again and no abscess or pus obtained.  Imp Post op - no wound infection apparent at this time  Plan: F/U as scheduled, sooner if any other problems with the incision. Will apply warm compressed to see if any improvement

## 2012-04-21 DIAGNOSIS — I10 Essential (primary) hypertension: Secondary | ICD-10-CM | POA: Diagnosis not present

## 2012-04-21 DIAGNOSIS — J449 Chronic obstructive pulmonary disease, unspecified: Secondary | ICD-10-CM | POA: Diagnosis not present

## 2012-04-21 DIAGNOSIS — T8189XA Other complications of procedures, not elsewhere classified, initial encounter: Secondary | ICD-10-CM | POA: Diagnosis not present

## 2012-04-21 DIAGNOSIS — I2510 Atherosclerotic heart disease of native coronary artery without angina pectoris: Secondary | ICD-10-CM | POA: Diagnosis not present

## 2012-04-22 ENCOUNTER — Encounter (INDEPENDENT_AMBULATORY_CARE_PROVIDER_SITE_OTHER): Payer: Self-pay | Admitting: Surgery

## 2012-04-22 ENCOUNTER — Ambulatory Visit (INDEPENDENT_AMBULATORY_CARE_PROVIDER_SITE_OTHER): Payer: Medicare Other | Admitting: Surgery

## 2012-04-22 VITALS — BP 150/62 | HR 104 | Temp 98.8°F | Resp 16 | Ht 62.0 in | Wt 129.4 lb

## 2012-04-22 DIAGNOSIS — K5730 Diverticulosis of large intestine without perforation or abscess without bleeding: Secondary | ICD-10-CM

## 2012-04-22 DIAGNOSIS — R109 Unspecified abdominal pain: Secondary | ICD-10-CM

## 2012-04-22 DIAGNOSIS — K56609 Unspecified intestinal obstruction, unspecified as to partial versus complete obstruction: Secondary | ICD-10-CM

## 2012-04-22 DIAGNOSIS — K589 Irritable bowel syndrome without diarrhea: Secondary | ICD-10-CM

## 2012-04-22 DIAGNOSIS — K56699 Other intestinal obstruction unspecified as to partial versus complete obstruction: Secondary | ICD-10-CM

## 2012-04-22 LAB — COMPREHENSIVE METABOLIC PANEL
ALT: 9 U/L (ref 0–35)
AST: 11 U/L (ref 0–37)
Albumin: 3.9 g/dL (ref 3.5–5.2)
Alkaline Phosphatase: 66 U/L (ref 39–117)
BUN: 19 mg/dL (ref 6–23)
CO2: 27 mEq/L (ref 19–32)
Calcium: 9.2 mg/dL (ref 8.4–10.5)
Chloride: 94 mEq/L — ABNORMAL LOW (ref 96–112)
Creat: 0.79 mg/dL (ref 0.50–1.10)
Glucose, Bld: 187 mg/dL — ABNORMAL HIGH (ref 70–99)
Potassium: 4.9 mEq/L (ref 3.5–5.3)
Sodium: 132 mEq/L — ABNORMAL LOW (ref 135–145)
Total Bilirubin: 0.4 mg/dL (ref 0.3–1.2)
Total Protein: 6.4 g/dL (ref 6.0–8.3)

## 2012-04-22 LAB — CBC WITH DIFFERENTIAL/PLATELET
Basophils Absolute: 0.1 10*3/uL (ref 0.0–0.1)
Basophils Relative: 1 % (ref 0–1)
Eosinophils Absolute: 0.2 10*3/uL (ref 0.0–0.7)
Eosinophils Relative: 3 % (ref 0–5)
HCT: 34.2 % — ABNORMAL LOW (ref 36.0–46.0)
Hemoglobin: 11.2 g/dL — ABNORMAL LOW (ref 12.0–15.0)
Lymphocytes Relative: 13 % (ref 12–46)
Lymphs Abs: 1.2 10*3/uL (ref 0.7–4.0)
MCH: 25.1 pg — ABNORMAL LOW (ref 26.0–34.0)
MCHC: 32.7 g/dL (ref 30.0–36.0)
MCV: 76.7 fL — ABNORMAL LOW (ref 78.0–100.0)
Monocytes Absolute: 0.7 10*3/uL (ref 0.1–1.0)
Monocytes Relative: 8 % (ref 3–12)
Neutro Abs: 7.1 10*3/uL (ref 1.7–7.7)
Neutrophils Relative %: 76 % (ref 43–77)
Platelets: 227 10*3/uL (ref 150–400)
RBC: 4.46 MIL/uL (ref 3.87–5.11)
RDW: 19.8 % — ABNORMAL HIGH (ref 11.5–15.5)
WBC: 9.3 10*3/uL (ref 4.0–10.5)

## 2012-04-22 NOTE — Progress Notes (Signed)
Subjective:     Patient ID: [name], female   DOB: 08/14/1930, 78 y.o.   MRN: [id number]  HPI  [name]  April 22, 1931 [id number]  Patient Care Team: Rama (Georgianne Fick) Sofie Hartigan as PCP - General (Internal Medicine) Mardella Layman, MD as Consulting Physician (Gastroenterology) Coralyn Helling, MD as Consulting Physician (Pulmonary Disease) Pricilla Riffle, MD as Consulting Physician (Cardiology) Marden Noble, MD as Consulting Physician (Internal Medicine)  This patient is a 78 y.o.female who presents today for surgical evaluation   PROCEDURES PERFORMED 03/18/2012: 1. Laparoscopic lysis of adhesions time 60 minutes (equals 1/3 of the  case).  2. Laparoscopically-assisted rectosigmoid resection (LAR).  3. Hand-sewn descending colon to rectal anastomosis.  4. Rigid proctoscopy.  Patient notes that she feels worse the past couple days.  More abdominal soreness.  Eating okay.  Outputs every day.  No nausea or vomiting.  Worried about her incision.  Pain on more left corner.  Still packing on right side.  Not taking pain medications.  No longer constipated.  Tending to be a little loose.   Energy level getting gradually better but not as fast as she had hoped.  Not taking any narcotic medicines.  After her usual low dose prednisone.  Does not know about her glucose levels  Patient Active Problem List  Diagnosis  . Pure hypercholesterolemia  . HYPERLIPIDEMIA  . DEPRESSION, CHRONIC  . Legal blindness  . HYPERTENSION  . CORONARY ARTERY DISEASE  . Chronic rhinitis  . EMPHYSEMA  . BOOP (bronchiolitis obliterans with organizing pneumonia)  . ZENKER'S DIVERTICULUM  . Irritable bowel syndrome  . Systemic sclerosis  . Colonic stricture  . Diverticulosis of colon  . Diabetes mellitus  . Anemia of chronic disease  . Postinflammatory pulmonary fibrosis  . Chronic confusional state  . Postoperative anemia    Past Medical History  Diagnosis Date  . Coronary artery disease    . Dressler's syndrome   . Hypertension   . Dyslipidemia   . Diabetes mellitus   . CVA (cerebral infarction) 1991  . Macular degeneration   . GERD (gastroesophageal reflux disease)   . Diverticulosis   . IBS (irritable bowel syndrome)   . Zenker's diverticulum   . Emphysema   . Pulmonary hypertension   . Chronic sinusitis   . Fibromyalgia   . Hypothyroidism   . Sjogren's disease   . Raynaud's phenomenon   . Cervical disc disease   . Adenomatous colon polyp   . Scleroderma   . COPD (chronic obstructive pulmonary disease)   . Arthritis   . Blood transfusion   . Myocardial infarction may 27th 2007  . Pneumonia      HX PNEUMONIA  . Depression   . 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 Cancer     cervical cancer pre hysterectomy  . Sleep apnea     STOP BANG SCORE 5  . Legally blind   . CONSTIPATION, CHRONIC 06/26/2007    Qualifier: Diagnosis of  By: Koleen Distance CMA Duncan Dull), Hulan Saas      Past Surgical History  Procedure Date  . Total abdominal hysterectomy 1973  . Bilateral oophorectomy 2002  . Spinal fusion 1997    and implantation of a plate   . Lobe thyroid removal   . Thyroid lobectomy 1991    removal of left lobe thyroid  . Cholecystectomy 1965  . Appendectomy   . Cataract extraction   . Bronchoscopy 02-22-09  .  Stented cardiac  artery 2007 / 2007 / 2010    X 3 STENT  . Proctoscopy 03/18/2012    Procedure: PROCTOSCOPY;  Surgeon: [name] Sportsman, MD;  Location: [facility];  Service: General;  Laterality: N/A;  rigid procto    History   Social History  . Marital Status: Divorced    Spouse Name: N/A    Number of Children: N/A  . Years of Education: N/A   Occupational History  . retired    Social History Main Topics  . Smoking status: Former Smoker -- 1.0 packs/day for 40 years    Types: Cigarettes    Quit date: 06/26/1988  . Smokeless tobacco: Never Used   Comment: 40 pack years  . Alcohol Use: No  . Drug Use: No  . Sexually Active: Not on  file   Other Topics Concern  . Not on file   Social History Narrative  . No narrative on file    Family History  Problem Relation Age of Onset  . Other Mother 65    Died from sepsis  . Stroke Father 65    died  . Heart disease Father   . Cancer Sister     lung  . Cancer Brother     lung    Current Outpatient Prescriptions  Medication Sig Dispense Refill  . amLODipine (NORVASC) 10 MG tablet Take 10 mg by mouth every morning. Once a day      . aspirin 325 MG tablet Take 325 mg by mouth 2 (two) times daily.       . beta carotene w/minerals (OCUVITE) tablet Take 1 tablet by mouth daily.      [name] doxycycline (VIBRAMYCIN) 100 MG capsule       . esomeprazole (NEXIUM) 40 MG capsule Take 1 capsule (40 mg total) by mouth daily before breakfast.  30 capsule  6  . ferrous sulfate 325 (65 FE) MG tablet Take 1 tablet (325 mg total) by mouth 2 (two) times daily with a meal.  60 tablet  1  . imipramine (TOFRANIL) 25 MG tablet Take 75 mg by mouth at bedtime.       . insulin lispro (HUMALOG) 100 UNIT/ML injection Inject 5 Units into the skin 2 (two) times daily. DO NOT SUBSTITUTE PER PATIENT REQUEST      . levofloxacin (LEVAQUIN) 500 MG tablet Take 500 mg by mouth daily.      [name] levothyroxine (SYNTHROID, LEVOTHROID) 125 MCG tablet Take 125 mcg by mouth every morning.       . lubiprostone (AMITIZA) 24 MCG capsule Take 24 mcg by mouth every morning.      . mometasone (NASONEX) 50 MCG/ACT nasal spray Place 2 sprays into the nose daily. As needed for congestion.      . potassium chloride SA (K-DUR,KLOR-CON) 20 MEQ tablet Take 2 tablets (40 mEq total) by mouth daily.  5 tablet  0  . predniSONE (DELTASONE) 5 MG tablet Take 5 mg by mouth daily.      . ticlopidine (TICLID) 250 MG tablet Take 1 tablet (250 mg total) by mouth 2 (two) times daily.  60 tablet  5  . triazolam (HALCION) 0.25 MG tablet Take 0.25 mg by mouth at bedtime as needed. For sleep         Allergies  Allergen Reactions  . Lantus  (Insulin Glargine)     Patient states "sulfate binder causes sinus infection/pneumonia".  . Metoprolol-Hydrochlorothiazide     REACTION: decreased bp  . Novolog (  Insulin Aspart)     Patient states "sulfate binder causes sinus infection/pneumonia". Tolerates Humalog.  . Sulfonamide Derivatives Hives  . Titanium     Cervical plate - had to change for stainless steel  . Adhesive (Tape) Hives and Rash    BP 150/62  Pulse 104  Temp 98.8 F (37.1 C) (Temporal)  Resp 16  Ht 5\' 2"  (1.575 m)  Wt 129 lb 6.4 oz (58.695 kg)  BMI 23.67 kg/m2  No results found.   Review of Systems  Constitutional: Negative for fever, chills, diaphoresis, appetite change, fatigue and unexpected weight change.  HENT: Negative for ear pain, sore throat and trouble swallowing.   Eyes: Negative for photophobia and visual disturbance.  Respiratory: Negative for cough and choking.   Cardiovascular: Negative for chest pain and palpitations.  Gastrointestinal: Positive for abdominal pain. Negative for nausea, vomiting, diarrhea, constipation, abdominal distention, anal bleeding and rectal pain.  Genitourinary: Negative for dysuria, frequency and difficulty urinating.  Musculoskeletal: Negative for myalgias and gait problem.  Skin: Positive for wound. Negative for color change, pallor and rash.  Neurological: Negative for dizziness, speech difficulty, weakness and numbness.  Hematological: Negative for adenopathy.  Psychiatric/Behavioral: Negative for confusion and agitation. The patient is not nervous/anxious.        Objective:   Physical Exam  Constitutional: She is oriented to person, place, and time. She appears well-developed and well-nourished. No distress.  HENT:  Head: Normocephalic.  Mouth/Throat: Oropharynx is clear and moist. No oropharyngeal exudate.  Eyes: Conjunctivae normal and EOM are normal. Pupils are equal, round, and reactive to light. No scleral icterus.  Neck: Normal range of motion. No  tracheal deviation present.  Cardiovascular: Normal rate and intact distal pulses.   Pulmonary/Chest: Effort normal. No respiratory distress. She exhibits no tenderness.  Abdominal: Soft. She exhibits no distension. There is tenderness. There is no rigidity, no rebound, no guarding, no CVA tenderness, no tenderness at McBurney's point and negative Murphy's sign. No hernia. Hernia confirmed negative in the right inguinal area and confirmed negative in the left inguinal area.         Incisions clean with normal healing ridges.  No hernias  Genitourinary: No vaginal discharge found.  Musculoskeletal: Normal range of motion. She exhibits no tenderness.  Lymphadenopathy:       Right: No inguinal adenopathy present.       Left: No inguinal adenopathy present.  Neurological: She is alert and oriented to person, place, and time. No cranial nerve deficit. She exhibits normal muscle tone. Coordination normal.  Skin: Skin is warm and dry. No rash noted. She is not diaphoretic.  Psychiatric: Her speech is normal and behavior is normal. Judgment and thought content normal. Her mood appears anxious. Her affect is not blunt and not labile. Cognition and memory are normal. She exhibits a depressed mood.       Assessment:     One month status post partial colectomy for chronic colonic stricture and a steroid dependent, former smoking, diabetic female.  Abdominal pain.  Question of New vs. Chronic.    Plan:     Heart attack if she is just being her usual gloomy self or if something more serious is going on.  Hopefully they are mild symptoms.  However, she could mask something more serious.  I would like to make sure we are not missing anything such as an abscess or leak.  CBC, CMET, UA, CT scan of abdomen and pelvis with oral and IV contrast.  Increase  activity as tolerated to regular activity.  Do not push through pain.  Diet as tolerated. Bowel regimen to avoid problems.  And fiber supplement very slowly  and gradually.  Return to clinic 2-3 weeks to see if she is improving.   If she is worse or does not improve, call us sooner.  Instructions discussed.  Followup with primary care physician for other health issues as would normally be done.  Questions answered.  The patient expressed understanding and appreciation

## 2012-04-22 NOTE — Patient Instructions (Addendum)
Obtain blood work and CT scan to rule out infection or other issues.  If you are feeling worse, please call us.  Otherwise, return to clinic in a few weeks.  Controlling your pain better:  Managing Pain  Pain after surgery or related to activity is often due to strain/injury to muscle, tendon, nerves and/or incisions.  This pain is usually short-term and will improve in a few months.   Many people find it helpful to do the following things TOGETHER to help speed the process of healing and to get back to regular activity more quickly:  1. Avoid heavy physical activity a.  no lifting greater than 20 pounds b. Do not "push through" the pain.  Listen to your body and avoid positions and maneuvers than reproduce the pain c. Walking is okay as tolerated, but go slowly and stop when getting sore.  d. Remember: If it hurts to do it, then don't do it! 2. Take Anti-inflammatory medication  a. Take with food/snack around the clock for 1-2 weeks i. This helps the muscle and nerve tissues become less irritable and calm down faster b. Choose ONE of the following over-the-counter medications: i. Naproxen 220mg  tabs (ex. Aleve) 1-2 pills twice a day  ii. Ibuprofen 200mg  tabs (ex. Advil, Motrin) 3-4 pills with every meal and just before bedtime iii. Acetaminophen 500mg  tabs (Tylenol) 1-2 pills with every meal and just before bedtime 3. Use a Heating pad or Ice/Cold Pack a. 4-6 times a day b. May use warm bath/hottub  or showers 4. Try Gentle Massage and/or Stretching  a. at the area of pain many times a day b. stop if you feel pain - do not overdo it  Try these steps together to help you body heal faster and avoid making things get worse.  Doing just one of these things may not be enough.    If you are not getting better after two weeks or are noticing you are getting worse, contact our office for further advice; we may need to re-evaluate you & see what other things we can do to help.  GETTING TO  GOOD BOWEL HEALTH. Irregular bowel habits such as constipation and diarrhea can lead to many problems over time.  Having one soft bowel movement a day is the most important way to prevent further problems.  The anorectal canal is designed to handle stretching and feces to safely manage our ability to get rid of solid waste (feces, poop, stool) out of our body.  BUT, hard constipated stools can act like ripping concrete bricks and diarrhea can be a burning fire to this very sensitive area of our body, causing inflamed hemorrhoids, anal fissures, increasing risk is perirectal abscesses, abdominal pain/bloating, an making irritable bowel worse.     The goal: ONE SOFT BOWEL MOVEMENT A DAY!  To have soft, regular bowel movements:    Drink at least 8 tall glasses of water a day.     Take plenty of fiber.  Fiber is the undigested part of plant food that passes into the colon, acting s "natures broom" to encourage bowel motility and movement.  Fiber can absorb and hold large amounts of water. This results in a larger, bulkier stool, which is soft and easier to pass. Work gradually over several weeks up to 6 servings a day of fiber (25g a day even more if needed) in the form of: o Vegetables -- Root (potatoes, carrots, turnips), leafy green (lettuce, salad greens, celery, spinach), or cooked high  residue (cabbage, broccoli, etc) o Fruit -- Fresh (unpeeled skin & pulp), Dried (prunes, apricots, cherries, etc ),  or stewed ( applesauce)  o Whole grain breads, pasta, etc (whole wheat)  o Bran cereals    Bulking Agents -- This type of water-retaining fiber generally is easily obtained each day by one of the following:  o Psyllium bran -- The psyllium plant is remarkable because its ground seeds can retain so much water. This product is available as Metamucil, Konsyl, Effersyllium, Per Diem Fiber, or the less expensive generic preparation in drug and health food stores. Although labeled a laxative, it really is not a  laxative.  o Methylcellulose -- This is another fiber derived from wood which also retains water. It is available as Citrucel. o Polyethylene Glycol - and "artificial" fiber commonly called Miralax or Glycolax.  It is helpful for people with gassy or bloated feelings with regular fiber o Flax Seed - a less gassy fiber than psyllium   No reading or other relaxing activity while on the toilet. If bowel movements take longer than 5 minutes, you are too constipated   AVOID CONSTIPATION.  High fiber and water intake usually takes care of this.  Sometimes a laxative is needed to stimulate more frequent bowel movements, but    Laxatives are not a good long-term solution as it can wear the colon out. o Osmotics (Milk of Magnesia, Fleets phosphosoda, Magnesium citrate, MiraLax, GoLytely) are safer than  o Stimulants (Senokot, Castor Oil, Dulcolax, Ex Lax)    o Do not take laxatives for more than 7days in a row.    IF SEVERELY CONSTIPATED, try a Bowel Retraining Program: o Do not use laxatives.  o Eat a diet high in roughage, such as bran cereals and leafy vegetables.  o Drink six (6) ounces of prune or apricot juice each morning.  o Eat two (2) large servings of stewed fruit each day.  o Take one (1) heaping tablespoon of a psyllium-based bulking agent twice a day. Use sugar-free sweetener when possible to avoid excessive calories.  o Eat a normal breakfast.  o Set aside 15 minutes after breakfast to sit on the toilet, but do not strain to have a bowel movement.  o If you do not have a bowel movement by the third day, use an enema and repeat the above steps.    Controlling diarrhea o Switch to liquids and simpler foods for a few days to avoid stressing your intestines further. o Avoid dairy products (especially milk & ice cream) for a short time.  The intestines often can lose the ability to digest lactose when stressed. o Avoid foods that cause gassiness or bloating.  Typical foods include beans and  other legumes, cabbage, broccoli, and dairy foods.  Every person has some sensitivity to other foods, so listen to our body and avoid those foods that trigger problems for you. o Adding fiber (Citrucel, Metamucil, psyllium, Miralax) gradually can help thicken stools by absorbing excess fluid and retrain the intestines to act more normally.  Slowly increase the dose over a few weeks.  Too much fiber too soon can backfire and cause cramping & bloating. o Probiotics (such as active yogurt, Align, etc) may help repopulate the intestines and colon with normal bacteria and calm down a sensitive digestive tract.  Most studies show it to be of mild help, though, and such products can be costly. o Medicines:   Bismuth subsalicylate (ex. Kayopectate, Pepto Bismol) every 30 minutes for up  to 6 doses can help control diarrhea.  Avoid if pregnant.   Loperamide (Immodium) can slow down diarrhea.  Start with two tablets (4mg  total) first and then try one tablet every 6 hours.  Avoid if you are having fevers or severe pain.  If you are not better or start feeling worse, stop all medicines and call your doctor for advice o Call your doctor if you are getting worse or not better.  Sometimes further testing (cultures, endoscopy, X-ray studies, bloodwork, etc) may be needed to help diagnose and treat the cause of the diarrhea. o

## 2012-04-23 ENCOUNTER — Encounter (INDEPENDENT_AMBULATORY_CARE_PROVIDER_SITE_OTHER): Payer: Self-pay | Admitting: General Surgery

## 2012-04-23 LAB — URINALYSIS, ROUTINE W REFLEX MICROSCOPIC
Bilirubin Urine: NEGATIVE
Glucose, UA: NEGATIVE mg/dL
Hgb urine dipstick: NEGATIVE
Ketones, ur: NEGATIVE mg/dL
Leukocytes, UA: NEGATIVE
Nitrite: NEGATIVE
Protein, ur: NEGATIVE mg/dL
Specific Gravity, Urine: 1.012 (ref 1.005–1.030)
Urobilinogen, UA: 0.2 mg/dL (ref 0.0–1.0)
pH: 6 (ref 5.0–8.0)

## 2012-04-24 ENCOUNTER — Encounter (HOSPITAL_COMMUNITY): Payer: Self-pay

## 2012-04-24 ENCOUNTER — Ambulatory Visit (HOSPITAL_COMMUNITY)
Admission: RE | Admit: 2012-04-24 | Discharge: 2012-04-24 | Disposition: A | Discharge status: Home / Self Care | Payer: Medicare Other | Source: Ambulatory Visit | Attending: Surgery | Admitting: Surgery

## 2012-04-24 ENCOUNTER — Telehealth (INDEPENDENT_AMBULATORY_CARE_PROVIDER_SITE_OTHER): Payer: Self-pay

## 2012-04-24 DIAGNOSIS — K56609 Unspecified intestinal obstruction, unspecified as to partial versus complete obstruction: Secondary | ICD-10-CM | POA: Diagnosis not present

## 2012-04-24 DIAGNOSIS — K5730 Diverticulosis of large intestine without perforation or abscess without bleeding: Secondary | ICD-10-CM

## 2012-04-24 DIAGNOSIS — K56699 Other intestinal obstruction unspecified as to partial versus complete obstruction: Secondary | ICD-10-CM

## 2012-04-24 DIAGNOSIS — R109 Unspecified abdominal pain: Secondary | ICD-10-CM

## 2012-04-24 MED ORDER — IOHEXOL 300 MG/ML  SOLN
100.0000 mL | Freq: Once | INTRAMUSCULAR | Status: AC | PRN
  Administered 2012-04-24: 100 mL via INTRAVENOUS

## 2012-04-24 NOTE — Telephone Encounter (Signed)
Patients daughter Lynnea Ferrier called in asking for CT results and to let us know patient was having a problem with SOB but refused to go to hospital. I told her patient needed to go to ER with SOB and difficulty breathing. Kerri states SOB is not due to breathing difficulty but that it is more due to the fact that her abdomin hurts when she breathes so she is not taking full breaths. I had Dr. Maisie Fus review CT results and told Lynnea Ferrier that it shows mild diverticulitis and constipation and that Dr Maisie Fus recommends patient do a liquid diet and take stool softener and we would send Dr. Michaell Cowing a note on this.

## 2012-04-26 ENCOUNTER — Telehealth (INDEPENDENT_AMBULATORY_CARE_PROVIDER_SITE_OTHER): Payer: Self-pay | Admitting: General Surgery

## 2012-04-26 NOTE — Telephone Encounter (Signed)
Called patient and made her aware of results. Patient still complains of pain. She states she feels like this is in her kidneys and she is not urinating as much as she was and she states it takes her longer to start her stream. I told her that her urinalysis was normal. She states that she is moving her bowels 2-3 times a day. She will keep an eye on the pain and if it gets worse she will call.

## 2012-04-26 NOTE — Telephone Encounter (Signed)
Message copied by Liliana Cline on Fri Apr 26, 2012  8:19 AM ------      Message from: [name] Sportsman      Created: Fri Apr 26, 2012  7:40 AM       Post-op changes with mild stranding one month s/p colectomy - I doubt diverticulitis.  No abscess/leak/SBO.  Constipated - needs a bowel regimen.  If Stool softeners not working, try Miralax BID.  Have pt see PCP if struggling w breathing

## 2012-05-06 ENCOUNTER — Encounter (INDEPENDENT_AMBULATORY_CARE_PROVIDER_SITE_OTHER): Payer: Self-pay | Admitting: Surgery

## 2012-05-06 ENCOUNTER — Ambulatory Visit (INDEPENDENT_AMBULATORY_CARE_PROVIDER_SITE_OTHER): Payer: Medicare Other | Admitting: Surgery

## 2012-05-06 VITALS — BP 142/62 | HR 82 | Temp 98.2°F | Resp 18 | Ht 62.0 in | Wt 127.2 lb

## 2012-05-06 DIAGNOSIS — K56699 Other intestinal obstruction unspecified as to partial versus complete obstruction: Secondary | ICD-10-CM

## 2012-05-06 DIAGNOSIS — IMO0001 Reserved for inherently not codable concepts without codable children: Secondary | ICD-10-CM

## 2012-05-06 DIAGNOSIS — F32A Depression, unspecified: Secondary | ICD-10-CM

## 2012-05-06 DIAGNOSIS — K589 Irritable bowel syndrome without diarrhea: Secondary | ICD-10-CM

## 2012-05-06 DIAGNOSIS — F329 Major depressive disorder, single episode, unspecified: Secondary | ICD-10-CM

## 2012-05-06 DIAGNOSIS — R32 Unspecified urinary incontinence: Secondary | ICD-10-CM

## 2012-05-06 DIAGNOSIS — K56609 Unspecified intestinal obstruction, unspecified as to partial versus complete obstruction: Secondary | ICD-10-CM

## 2012-05-06 DIAGNOSIS — F3289 Other specified depressive episodes: Secondary | ICD-10-CM

## 2012-05-06 DIAGNOSIS — K5730 Diverticulosis of large intestine without perforation or abscess without bleeding: Secondary | ICD-10-CM

## 2012-05-06 DIAGNOSIS — M797 Fibromyalgia: Secondary | ICD-10-CM | POA: Insufficient documentation

## 2012-05-06 NOTE — Progress Notes (Signed)
Subjective:     Patient ID: [name], female   DOB: 05-17-1931, 78 y.o.   MRN: [id number]  HPI   [name]  01-07-1931 [id number]  Patient Care Team: Rama (Georgianne Fick) Sofie Hartigan as PCP - General (Internal Medicine) Mardella Layman, MD as Consulting Physician (Gastroenterology) Coralyn Helling, MD as Consulting Physician (Pulmonary Disease) Pricilla Riffle, MD as Consulting Physician (Cardiology) Marden Noble, MD as Consulting Physician (Internal Medicine)  This patient is a 78 y.o.female who presents today for surgical evaluation   PROCEDURES PERFORMED 03/18/2012: 1. Laparoscopic lysis of adhesions time 60 minutes (equals 1/3 of the  case).  2. Laparoscopically-assisted rectosigmoid resection (LAR).  3. Hand-sewn descending colon to rectal anastomosis.  4. Rigid proctoscopy.  FINAL DIAGNOSIS Diagnosis Colon, segmental resection, recto-sigmoid - BENIGN COLORECTAL MUCOSA WITH SEROSAL ADHESIONS AND STRICTURE FORMATION. - TWO BENIGN LYMPH NODES (0/2). - NO TUMOR SEEN. - SEE COMMENT. Microscopic Comment Sections show benign colorectal mucosa with increased muscularis propria thickness and areas of grossly identifiable stricture formation. Adhesion formation is also identified. There is no tumor seen. (RAH:caf 03/20/12) [name] MD Pathologist, Electronic Signature (Case signed 03/20/2012) Specimen Lechelle Wrigley and Clinical Information Specimen(s) Obtained: Colon, segmental resection, recto-sigmoid Specimen Clinical Information colon stricture, diverticulitis [jl] Aaren Krog Received in fixative is an oriented segment of clinically stated rectosigmoid colon. A stitch is present at the distal end, per specimen requisition. The specimen measures 28 cm in length. The serosa is [name] tan, dull, dusky, with numerous serosal adhesions. There is a moderate amount of adherent yellow lobulated pericolonic and mesenteric adipose tissue radiating up to 6 cm. The specimen is  opened along is length revealing a wall thickness measuring 0.5 cm. The mucosa is [name] tan, velvety and normally folded. No mucosal lesions are noted grossly. There is an area located 11 cm from the distal resection margin in which the luminal diameter decreases from 2 cm to approximately 1 cm and remains that way all the way to the distal margin. A small kink is present in the specimen at this area however, no mucosal or serosal lesions are noted. Sections through the specimen reveal no diverticuli. The wall appears moderately thickened in the 1 of 2 FINAL for NICKY, [place] [phone]) Ashaunti Treptow(continued) area where the luminal diameter is decreased. Sections through the adherent pericolonic adipose tissue reveals no suspicious foci of firmness or nodularity. Several unremarkable lymph nodes are identified measuring up to 0.3 cm. Representative sections are submitted. A = sections from proximal end of specimen B = sections from distal end of specimen with decreased luminal diameter C = sections from small kink in the specimen D = three possible lymph nodes Total = four blocks. (JC:kh 03-20-12) Report signed out from the following location(s) [facility] [address],[place] [address].CLIA:[id number],CAP:[id number]., [facility] [address], [place], [place] [address]. CLIA #: [id number],  The patient comes in today with her daughter.  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  Still feels tired.  Does not want to get out of the house.  Walks around the house well.  Still with constipation on MiraLAX.  Will get loose diarrhea with milk of magnesia.  Eating okay.  Energy level not back to normal.  Still with soreness in left lower side, but less.  No nausea or vomiting.  No fevers or chills.  Some leaking of urine with coughing and sneezing.  No pain with urination.  No bleeding.    She has not seen her primary  care physician since I saw her last  month.  She did not set up an appointment to address her issues with urinating.  Blood work and urinalysis was normal.  CT scan showed a few small dots of air and inflammation.  To me, it was very subtle and consistent with postoperative changes.  No obvious leak, abscess, recurrent diverticulitis.  Large bladder unchanged  Patient Active Problem List  Diagnosis  . Pure hypercholesterolemia  . HYPERLIPIDEMIA  . DEPRESSION, CHRONIC  . Legal blindness  . HYPERTENSION  . CORONARY ARTERY DISEASE  . Chronic rhinitis  . EMPHYSEMA  . BOOP (bronchiolitis obliterans with organizing pneumonia)  . ZENKER'S DIVERTICULUM  . Irritable bowel syndrome  . Systemic sclerosis  . Colonic stricture s/p lap sigmoid colectomy 03/18/2012  . Diverticulosis of colon  . Diabetes mellitus  . Anemia of chronic disease  . Postinflammatory pulmonary fibrosis  . Chronic confusional state  . Postoperative anemia    Past Medical History  Diagnosis Date  . Coronary artery disease   . Dressler's syndrome   . Hypertension   . Dyslipidemia   . Diabetes mellitus   . CVA (cerebral infarction) 1991  . Macular degeneration   . GERD (gastroesophageal reflux disease)   . Diverticulosis   . IBS (irritable bowel syndrome)   . Zenker's diverticulum   . Emphysema   . Pulmonary hypertension   . Chronic sinusitis   . Fibromyalgia   . Hypothyroidism   . Sjogren's disease   . Raynaud's phenomenon   . Cervical disc disease   . Adenomatous colon polyp   . Scleroderma   . COPD (chronic obstructive pulmonary disease)   . Arthritis   . Blood transfusion   . Myocardial infarction may 27th 2007  . Pneumonia      HX PNEUMONIA  . Depression   . 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 Cancer     cervical cancer pre hysterectomy  . Sleep apnea     STOP BANG SCORE 5  . Legally blind   . CONSTIPATION, CHRONIC 06/26/2007    Qualifier: Diagnosis of  By: Koleen Distance CMA Duncan Dull), Hulan Saas      Past Surgical  History  Procedure Date  . Total abdominal hysterectomy 1973  . Bilateral oophorectomy 2002  . Spinal fusion 1997    and implantation of a plate   . Lobe thyroid removal   . Thyroid lobectomy 1991    removal of left lobe thyroid  . Cholecystectomy 1965  . Appendectomy   . Cataract extraction   . Bronchoscopy 02-22-09  . Stented cardiac  artery 2007 / 2007 / 2010    X 3 STENT  . Proctoscopy 03/18/2012    Procedure: PROCTOSCOPY;  Surgeon: [name] Sportsman, MD;  Location: [facility];  Service: General;  Laterality: N/A;  rigid procto    History   Social History  . Marital Status: Divorced    Spouse Name: N/A    Number of Children: N/A  . Years of Education: N/A   Occupational History  . retired    Social History Main Topics  . Smoking status: Former Smoker -- 1.0 packs/day for 40 years    Types: Cigarettes    Quit date: 06/26/1988  . Smokeless tobacco: Never Used     Comment: 40 pack years  . Alcohol Use: No  . Drug Use: No  . Sexually Active: Not on file   Other Topics Concern  . Not on  file   Social History Narrative  . No narrative on file    Family History  Problem Relation Age of Onset  . Other Mother 34    Died from sepsis  . Stroke Father 68    died  . Heart disease Father   . Cancer Sister     lung  . Cancer Brother     lung    Current Outpatient Prescriptions  Medication Sig Dispense Refill  . amLODipine (NORVASC) 10 MG tablet Take 10 mg by mouth every morning. Once a day      . aspirin 325 MG tablet Take 325 mg by mouth 2 (two) times daily.       . beta carotene w/minerals (OCUVITE) tablet Take 1 tablet by mouth daily.      [name] esomeprazole (NEXIUM) 40 MG capsule Take 1 capsule (40 mg total) by mouth daily before breakfast.  30 capsule  6  . ferrous sulfate 325 (65 FE) MG tablet Take 1 tablet (325 mg total) by mouth 2 (two) times daily with a meal.  60 tablet  1  . imipramine (TOFRANIL) 25 MG tablet Take 75 mg by mouth at bedtime.       . insulin  lispro (HUMALOG) 100 UNIT/ML injection Inject 5 Units into the skin 2 (two) times daily. DO NOT SUBSTITUTE PER PATIENT REQUEST      . levofloxacin (LEVAQUIN) 500 MG tablet Take 500 mg by mouth daily.      [name] lubiprostone (AMITIZA) 24 MCG capsule Take 24 mcg by mouth every morning.      . mometasone (NASONEX) 50 MCG/ACT nasal spray Place 2 sprays into the nose daily. As needed for congestion.      . predniSONE (DELTASONE) 5 MG tablet Take 5 mg by mouth daily.      . ticlopidine (TICLID) 250 MG tablet Take 1 tablet (250 mg total) by mouth 2 (two) times daily.  60 tablet  5  . triazolam (HALCION) 0.25 MG tablet Take 0.25 mg by mouth at bedtime as needed. For sleep      . doxycycline (VIBRAMYCIN) 100 MG capsule       . levothyroxine (SYNTHROID, LEVOTHROID) 125 MCG tablet Take 125 mcg by mouth every morning.       . potassium chloride SA (K-DUR,KLOR-CON) 20 MEQ tablet Take 2 tablets (40 mEq total) by mouth daily.  5 tablet  0     Allergies  Allergen Reactions  . Lantus (Insulin Glargine)     Patient states "sulfate binder causes sinus infection/pneumonia".  . Metoprolol-Hydrochlorothiazide     REACTION: decreased bp  . Novolog (Insulin Aspart)     Patient states "sulfate binder causes sinus infection/pneumonia". Tolerates Humalog.  . Sulfonamide Derivatives Hives  . Titanium     Cervical plate - had to change for stainless steel  . Adhesive (Tape) Hives and Rash    BP 142/62  Pulse 82  Temp 98.2 F (36.8 C) (Temporal)  Resp 18  Ht 5\' 2"  (1.575 m)  Wt 127 lb 3.2 oz (57.698 kg)  BMI 23.27 kg/m2  No results found.   Review of Systems  Constitutional: Negative for fever, chills, diaphoresis, appetite change, fatigue and unexpected weight change.  HENT: Negative for ear pain, sore throat and trouble swallowing.   Eyes: Negative for photophobia and visual disturbance.  Respiratory: Negative for cough and choking.   Cardiovascular: Negative for chest pain and palpitations.    Gastrointestinal: Positive for abdominal pain. Negative for nausea, vomiting, diarrhea,  constipation, abdominal distention, anal bleeding and rectal pain.  Genitourinary: Negative for dysuria, frequency and difficulty urinating.  Musculoskeletal: Negative for myalgias and gait problem.  Skin: Positive for wound. Negative for color change, pallor and rash.  Neurological: Negative for dizziness, speech difficulty, weakness and numbness.  Hematological: Negative for adenopathy.  Psychiatric/Behavioral: Negative for confusion and agitation. The patient is not nervous/anxious.        Objective:   Physical Exam  Constitutional: She is oriented to person, place, and time. She appears well-developed and well-nourished. No distress.  HENT:  Head: Normocephalic.  Mouth/Throat: Oropharynx is clear and moist. No oropharyngeal exudate.  Eyes: Conjunctivae normal and EOM are normal. Pupils are equal, round, and reactive to light. No scleral icterus.  Neck: Normal range of motion. No tracheal deviation present.  Cardiovascular: Normal rate and intact distal pulses.   Pulmonary/Chest: Effort normal. No respiratory distress. She exhibits no tenderness.  Abdominal: Soft. She exhibits no distension. There is tenderness. There is no rigidity, no rebound, no guarding, no CVA tenderness, no tenderness at McBurney's point and negative Murphy's sign. No hernia. Hernia confirmed negative in the right inguinal area and confirmed negative in the left inguinal area.         Incisions clean with normal healing ridges.  No hernias  Genitourinary: No vaginal discharge found.  Musculoskeletal: Normal range of motion. She exhibits no tenderness.  Lymphadenopathy:       Right: No inguinal adenopathy present.       Left: No inguinal adenopathy present.  Neurological: She is alert and oriented to person, place, and time. No cranial nerve deficit. She exhibits normal muscle tone. Coordination normal.  Skin: Skin is warm  and dry. No rash noted. She is not diaphoretic.  Psychiatric: Her speech is normal and behavior is normal. Judgment and thought content normal. Her mood appears not anxious. Her affect is not blunt and not labile. Cognition and memory are normal. She exhibits a depressed mood.       Assessment:     6 weeks status post partial left colectomy for chronic colonic stricture in a steroid dependent, former smoking, diabetic female with fibromyalgia & depression.  Abdominal pain.  More likely chronic.    Plan:     It does not surprise me that she is not pain free.  Has many other health conditions that make her recovery slow.  She looks basically at her baseline from when I saw her before, which is reassuring.  By the end of the visit, and she and her daughter were feeling more positive.  Discussed urinary issues with her primary care physician as I had recommended before.  Consider seeing urology.  Kegel pelvic exercises.  Urinalysis last time was negative.  Increase activity as tolerated to regular activity.  Get outside and walk an hour a day.  Do not push through pain.  Diet as tolerated. Bowel regimen to avoid problems.  Increase Miralax supplement gradually to decrease the need for laxatives which give her massive diarrhea  Return to clinic 1 month to see if she is improving.   If she is worse or does not improve, call us sooner & repeat labs/CT scan.  Instructions discussed.  Followup with primary care physician for other health issues (especially urinary issues)  &as would normally be done.  Questions answered.  The patient & daughter expressed understanding and appreciation

## 2012-05-06 NOTE — Patient Instructions (Addendum)
Consider improving your pain control with Tylenol and heat.  Get out and walk outside up to an hour a day  Increase MiraLAX dose until having one soft bowel movement a day.  Followup with your primary care physician or urologist for your concerns of leaking with urination.  Managing Pain  Pain after surgery or related to activity is often due to strain/injury to muscle, tendon, nerves and/or incisions.  This pain is usually short-term and will improve in a few months.   Many people find it helpful to do the following things TOGETHER to help speed the process of healing and to get back to regular activity more quickly:  1. Avoid heavy physical activity a.  no lifting greater than 20 pounds b. Do not "push through" the pain.  Listen to your body and avoid positions and maneuvers than reproduce the pain c. Walking is okay as tolerated, but go slowly and stop when getting sore.  d. Remember: If it hurts to do it, then don't do it! 2. Take Anti-inflammatory medication  a. Take with food/snack around the clock for 1-2 weeks i. This helps the muscle and nerve tissues become less irritable and calm down faster b. Choose ONE of the following over-the-counter medications: i. Naproxen 220mg  tabs (ex. Aleve) 1-2 pills twice a day  ii. Ibuprofen 200mg  tabs (ex. Advil, Motrin) 3-4 pills with every meal and just before bedtime iii. Acetaminophen 500mg  tabs (Tylenol) 1-2 pills with every meal and just before bedtime 3. Use a Heating pad or Ice/Cold Pack a. 4-6 times a day b. May use warm bath/hottub  or showers 4. Try Gentle Massage and/or Stretching  a. at the area of pain many times a day b. stop if you feel pain - do not overdo it  Try these steps together to help you body heal faster and avoid making things get worse.  Doing just one of these things may not be enough.    If you are not getting better after two weeks or are noticing you are getting worse, contact our office for further advice;  we may need to re-evaluate you & see what other things we can do to help.    GETTING TO GOOD BOWEL HEALTH. Irregular bowel habits such as constipation and diarrhea can lead to many problems over time.  Having one soft bowel movement a day is the most important way to prevent further problems.  The anorectal canal is designed to handle stretching and feces to safely manage our ability to get rid of solid waste (feces, poop, stool) out of our body.  BUT, hard constipated stools can act like ripping concrete bricks and diarrhea can be a burning fire to this very sensitive area of our body, causing inflamed hemorrhoids, anal fissures, increasing risk is perirectal abscesses, abdominal pain/bloating, an making irritable bowel worse.     The goal: ONE SOFT BOWEL MOVEMENT A DAY!  To have soft, regular bowel movements:    Drink at least 8 tall glasses of water a day.     Take plenty of fiber.  Fiber is the undigested part of plant food that passes into the colon, acting s "natures broom" to encourage bowel motility and movement.  Fiber can absorb and hold large amounts of water. This results in a larger, bulkier stool, which is soft and easier to pass. Work gradually over several weeks up to 6 servings a day of fiber (25g a day even more if needed) in the form of: o Vegetables --  Root (potatoes, carrots, turnips), leafy green (lettuce, salad greens, celery, spinach), or cooked high residue (cabbage, broccoli, etc) o Fruit -- Fresh (unpeeled skin & pulp), Dried (prunes, apricots, cherries, etc ),  or stewed ( applesauce)  o Whole grain breads, pasta, etc (whole wheat)  o Bran cereals    Bulking Agents -- This type of water-retaining fiber generally is easily obtained each day by one of the following:  o Psyllium bran -- The psyllium plant is remarkable because its ground seeds can retain so much water. This product is available as Metamucil, Konsyl, Effersyllium, Per Diem Fiber, or the less expensive generic  preparation in drug and health food stores. Although labeled a laxative, it really is not a laxative.  o Methylcellulose -- This is another fiber derived from wood which also retains water. It is available as Citrucel. o Polyethylene Glycol - and "artificial" fiber commonly called Miralax or Glycolax.  It is helpful for people with gassy or bloated feelings with regular fiber o Flax Seed - a less gassy fiber than psyllium   No reading or other relaxing activity while on the toilet. If bowel movements take longer than 5 minutes, you are too constipated   AVOID CONSTIPATION.  High fiber and water intake usually takes care of this.  Sometimes a laxative is needed to stimulate more frequent bowel movements, but    Laxatives are not a good long-term solution as it can wear the colon out. o Osmotics (Milk of Magnesia, Fleets phosphosoda, Magnesium citrate, MiraLax, GoLytely) are safer than  o Stimulants (Senokot, Castor Oil, Dulcolax, Ex Lax)    o Do not take laxatives for more than 7days in a row.    IF SEVERELY CONSTIPATED, try a Bowel Retraining Program: o Do not use laxatives.  o Eat a diet high in roughage, such as bran cereals and leafy vegetables.  o Drink six (6) ounces of prune or apricot juice each morning.  o Eat two (2) large servings of stewed fruit each day.  o Take one (1) heaping tablespoon of a psyllium-based bulking agent twice a day. Use sugar-free sweetener when possible to avoid excessive calories.  o Eat a normal breakfast.  o Set aside 15 minutes after breakfast to sit on the toilet, but do not strain to have a bowel movement.  o If you do not have a bowel movement by the third day, use an enema and repeat the above steps.    Controlling diarrhea o Switch to liquids and simpler foods for a few days to avoid stressing your intestines further. o Avoid dairy products (especially milk & ice cream) for a short time.  The intestines often can lose the ability to digest lactose when  stressed. o Avoid foods that cause gassiness or bloating.  Typical foods include beans and other legumes, cabbage, broccoli, and dairy foods.  Every person has some sensitivity to other foods, so listen to our body and avoid those foods that trigger problems for you. o Adding fiber (Citrucel, Metamucil, psyllium, Miralax) gradually can help thicken stools by absorbing excess fluid and retrain the intestines to act more normally.  Slowly increase the dose over a few weeks.  Too much fiber too soon can backfire and cause cramping & bloating. o Probiotics (such as active yogurt, Align, etc) may help repopulate the intestines and colon with normal bacteria and calm down a sensitive digestive tract.  Most studies show it to be of mild help, though, and such products can be costly. o  Medicines:   Bismuth subsalicylate (ex. Kayopectate, Pepto Bismol) every 30 minutes for up to 6 doses can help control diarrhea.  Avoid if pregnant.   Loperamide (Immodium) can slow down diarrhea.  Start with two tablets (4mg  total) first and then try one tablet every 6 hours.  Avoid if you are having fevers or severe pain.  If you are not better or start feeling worse, stop all medicines and call your doctor for advice o Call your doctor if you are getting worse or not better.  Sometimes further testing (cultures, endoscopy, X-ray studies, bloodwork, etc) may be needed to help diagnose and treat the cause of the diarrhea. o

## 2012-05-22 DIAGNOSIS — E039 Hypothyroidism, unspecified: Secondary | ICD-10-CM | POA: Diagnosis not present

## 2012-05-22 DIAGNOSIS — J309 Allergic rhinitis, unspecified: Secondary | ICD-10-CM | POA: Diagnosis not present

## 2012-05-22 DIAGNOSIS — IMO0001 Reserved for inherently not codable concepts without codable children: Secondary | ICD-10-CM | POA: Diagnosis not present

## 2012-05-22 DIAGNOSIS — I10 Essential (primary) hypertension: Secondary | ICD-10-CM | POA: Diagnosis not present

## 2012-06-06 DIAGNOSIS — H353290 Exudative age-related macular degeneration, unspecified eye, stage unspecified: Secondary | ICD-10-CM | POA: Diagnosis not present

## 2012-06-26 HISTORY — PX: COLON SURGERY: SHX602

## 2012-06-27 DIAGNOSIS — N390 Urinary tract infection, site not specified: Secondary | ICD-10-CM | POA: Diagnosis not present

## 2012-06-27 DIAGNOSIS — E109 Type 1 diabetes mellitus without complications: Secondary | ICD-10-CM | POA: Diagnosis not present

## 2012-06-27 DIAGNOSIS — R5383 Other fatigue: Secondary | ICD-10-CM | POA: Diagnosis not present

## 2012-06-27 DIAGNOSIS — E782 Mixed hyperlipidemia: Secondary | ICD-10-CM | POA: Diagnosis not present

## 2012-06-27 DIAGNOSIS — R5381 Other malaise: Secondary | ICD-10-CM | POA: Diagnosis not present

## 2012-07-04 ENCOUNTER — Encounter: Payer: Self-pay | Admitting: Internal Medicine

## 2012-07-05 DIAGNOSIS — N951 Menopausal and female climacteric states: Secondary | ICD-10-CM | POA: Diagnosis not present

## 2012-07-05 DIAGNOSIS — I2510 Atherosclerotic heart disease of native coronary artery without angina pectoris: Secondary | ICD-10-CM | POA: Diagnosis not present

## 2012-07-05 DIAGNOSIS — I252 Old myocardial infarction: Secondary | ICD-10-CM | POA: Diagnosis not present

## 2012-07-05 DIAGNOSIS — E782 Mixed hyperlipidemia: Secondary | ICD-10-CM | POA: Diagnosis not present

## 2012-07-05 DIAGNOSIS — E1065 Type 1 diabetes mellitus with hyperglycemia: Secondary | ICD-10-CM | POA: Diagnosis not present

## 2012-08-07 ENCOUNTER — Other Ambulatory Visit: Payer: Self-pay

## 2012-08-07 MED ORDER — TICLOPIDINE HCL 250 MG PO TABS: 250.0000 mg | ORAL_TABLET | Freq: Two times a day (BID) | ORAL | Status: DC

## 2012-09-05 ENCOUNTER — Ambulatory Visit: Payer: Medicare Other | Admitting: Internal Medicine

## 2012-09-16 ENCOUNTER — Encounter: Payer: Self-pay | Admitting: Internal Medicine

## 2012-09-16 ENCOUNTER — Ambulatory Visit (INDEPENDENT_AMBULATORY_CARE_PROVIDER_SITE_OTHER): Payer: Medicare Other | Admitting: Internal Medicine

## 2012-09-16 VITALS — BP 149/61 | HR 78 | Wt 130.0 lb

## 2012-09-16 DIAGNOSIS — I2510 Atherosclerotic heart disease of native coronary artery without angina pectoris: Secondary | ICD-10-CM | POA: Diagnosis not present

## 2012-09-16 DIAGNOSIS — I10 Essential (primary) hypertension: Secondary | ICD-10-CM

## 2012-09-16 LAB — CBC WITH DIFFERENTIAL/PLATELET
Basophils Absolute: 0.1 10*3/uL (ref 0.0–0.1)
Basophils Relative: 0.9 % (ref 0.0–3.0)
Eosinophils Absolute: 0.1 10*3/uL (ref 0.0–0.7)
Eosinophils Relative: 1.4 % (ref 0.0–5.0)
HCT: 36.7 % (ref 36.0–46.0)
Hemoglobin: 12.6 g/dL (ref 12.0–15.0)
Lymphocytes Relative: 19.4 % (ref 12.0–46.0)
Lymphs Abs: 1.7 10*3/uL (ref 0.7–4.0)
MCHC: 34.4 g/dL (ref 30.0–36.0)
MCV: 84.1 fl (ref 78.0–100.0)
Monocytes Absolute: 0.5 10*3/uL (ref 0.1–1.0)
Monocytes Relative: 5.8 % (ref 3.0–12.0)
Neutro Abs: 6.3 10*3/uL (ref 1.4–7.7)
Neutrophils Relative %: 72.5 % (ref 43.0–77.0)
Platelets: 216 10*3/uL (ref 150.0–400.0)
RBC: 4.36 Mil/uL (ref 3.87–5.11)
RDW: 14.1 % (ref 11.5–14.6)
WBC: 8.7 10*3/uL (ref 4.5–10.5)

## 2012-09-16 LAB — BASIC METABOLIC PANEL
BUN: 17 mg/dL (ref 6–23)
CO2: 28 mEq/L (ref 19–32)
Calcium: 8.7 mg/dL (ref 8.4–10.5)
Chloride: 97 mEq/L (ref 96–112)
Creatinine, Ser: 0.8 mg/dL (ref 0.4–1.2)
GFR: 69.39 mL/min (ref >60.00)
Glucose, Bld: 91 mg/dL (ref 70–99)
Potassium: 3.5 mEq/L (ref 3.5–5.1)
Sodium: 133 mEq/L — ABNORMAL LOW (ref 135–145)

## 2012-09-16 LAB — MAGNESIUM: Magnesium: 1.7 mg/dL (ref 1.5–2.5)

## 2012-09-16 NOTE — Patient Instructions (Addendum)
LABS TODAY:  Cbc, bmet, mag  Your physician wants you to follow-up in: November 2014.  You will receive a reminder letter in the mail two months in advance. If you don't receive a letter, please call our office to schedule the follow-up appointment.

## 2012-09-16 NOTE — Progress Notes (Addendum)
HPI Patient is a 79 year old with a history of BOOP and  CAD (s/p IWMI with stent ot RCA. repeat stnet to the RCA for instent restenois. She is also s/p DES to L main. Last cath In summer 2011 showed: 40% LAD lesion; 50% Lcx; small OM1 with 80% lesion; RCA with 20% instent restenosis This was stable. Pt continued on medical Rx. She was last in cardiology clnic last spring Breathing is OK  Denies CP Allergies  Allergen Reactions  . Lantus (Insulin Glargine)     Patient states "sulfate binder causes sinus infection/pneumonia".  . Metoprolol-Hydrochlorothiazide     REACTION: decreased bp  . Novolog (Insulin Aspart)     Patient states "sulfate binder causes sinus infection/pneumonia". Tolerates Humalog.  . Sulfonamide Derivatives Hives  . Titanium     Cervical plate - had to change for stainless steel  . Adhesive (Tape) Hives and Rash    Current Outpatient Prescriptions  Medication Sig Dispense Refill  . amLODipine (NORVASC) 10 MG tablet Take 10 mg by mouth every morning. Once a day      . aspirin 325 MG tablet Take 325 mg by mouth 2 (two) times daily.       . beta carotene w/minerals (OCUVITE) tablet Take 1 tablet by mouth daily.      [name] doxycycline (VIBRAMYCIN) 100 MG capsule       . esomeprazole (NEXIUM) 40 MG capsule Take 1 capsule (40 mg total) by mouth daily before breakfast.  30 capsule  6  . ferrous sulfate 325 (65 FE) MG tablet Take 1 tablet (325 mg total) by mouth 2 (two) times daily with a meal.  60 tablet  1  . imipramine (TOFRANIL) 25 MG tablet Take 75 mg by mouth at bedtime.       . insulin lispro (HUMALOG) 100 UNIT/ML injection Inject 5 Units into the skin 2 (two) times daily. DO NOT SUBSTITUTE PER PATIENT REQUEST      . levofloxacin (LEVAQUIN) 500 MG tablet Take 500 mg by mouth daily.      [name] levothyroxine (SYNTHROID, LEVOTHROID) 125 MCG tablet Take 125 mcg by mouth every morning.       . lubiprostone (AMITIZA) 24 MCG capsule Take 24 mcg by mouth every morning.      .  mometasone (NASONEX) 50 MCG/ACT nasal spray Place 2 sprays into the nose daily. As needed for congestion.      . potassium chloride SA (K-DUR,KLOR-CON) 20 MEQ tablet Take 2 tablets (40 mEq total) by mouth daily.  5 tablet  0  . predniSONE (DELTASONE) 5 MG tablet Take 5 mg by mouth daily.      . ticlopidine (TICLID) 250 MG tablet Take 1 tablet (250 mg total) by mouth 2 (two) times daily.  60 tablet  5  . triazolam (HALCION) 0.25 MG tablet Take 0.25 mg by mouth at bedtime as needed. For sleep       No current facility-administered medications for this visit.    Past Medical History  Diagnosis Date  . Coronary artery disease   . Dressler's syndrome   . Hypertension   . Dyslipidemia   . Diabetes mellitus   . CVA (cerebral infarction) 1991  . Macular degeneration   . GERD (gastroesophageal reflux disease)   . Diverticulosis   . IBS (irritable bowel syndrome)   . Zenker's diverticulum   . Emphysema   . Pulmonary hypertension   . Chronic sinusitis   . Fibromyalgia   . Hypothyroidism   .  Sjogren's disease   . Raynaud's phenomenon   . Cervical disc disease   . Adenomatous colon polyp   . Scleroderma   . COPD (chronic obstructive pulmonary disease)   . Arthritis   . Blood transfusion   . Myocardial infarction may 27th 2007  . Pneumonia      HX PNEUMONIA  . Depression   . 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 Cancer     cervical cancer pre hysterectomy  . Sleep apnea     STOP BANG SCORE 5  . Legally blind   . CONSTIPATION, CHRONIC 06/26/2007    Qualifier: Diagnosis of  By: Koleen Distance CMA Duncan Dull), Hulan Saas      Past Surgical History  Procedure Laterality Date  . Total abdominal hysterectomy  1973  . Bilateral oophorectomy  2002  . Spinal fusion  1997    and implantation of a plate   . Lobe thyroid removal    . Thyroid lobectomy  1991    removal of left lobe thyroid  . Cholecystectomy  1965  . Appendectomy    . Cataract extraction    . Bronchoscopy  02-22-09   . Stented cardiac  artery  2007 / 2007 / 2010    X 3 STENT  . Proctoscopy  03/18/2012    Procedure: PROCTOSCOPY;  Surgeon: [name] Sportsman, MD;  Location: [facility];  Service: General;  Laterality: N/A;  rigid procto    Family History  Problem Relation Age of Onset  . Other Mother 86    Died from sepsis  . Stroke Father 13    died  . Heart disease Father   . Cancer Sister     lung  . Cancer Brother     lung    History   Social History  . Marital Status: Divorced    Spouse Name: N/A    Number of Children: N/A  . Years of Education: N/A   Occupational History  . retired    Social History Main Topics  . Smoking status: Former Smoker -- 1.00 packs/day for 40 years    Types: Cigarettes    Quit date: 06/26/1988  . Smokeless tobacco: Never Used     Comment: 40 pack years  . Alcohol Use: No  . Drug Use: No  . Sexually Active: Not on file   Other Topics Concern  . Not on file   Social History Narrative  . No narrative on file    Review of Systems:  All systems reviewed.  They are negative to the above problem except as previously stated.  Vital Signs: BP 149/61  Pulse 78  Wt 130 lb (58.968 kg)  BMI 23.77 kg/m2  Physical Exam Patient is in NAD HEENT:  Normocephalic, atraumatic. EOMI, PERRLA.  Neck: JVP is normal.  No bruits.  Lungs: clear to auscultation. No rales no wheezes.  Heart: Regular rate and rhythm. Normal S1, S2. No S3.   No significant murmurs. PMI not displaced.  Abdomen:  Supple, nontender. Normal bowel sounds. No masses. No hepatomegaly.  Extremities:   Good distal pulses throughout. No lower extremity edema.  Musculoskeletal :moving all extremities.  Neuro:   alert and oriented x3.  CN II-XII grossly intact.  EKG  SR 78  Nonspecific ST T wave changes.   Assessment and Plan:  1.  CAD  Doing well  No symptoms of angina.  Keep on same regimen  2.  HL  Not on statin  Has not tolerated in past  Will review labs.  3.  HTN  Keep on same regimen.   Check BMET.

## 2012-09-30 DIAGNOSIS — I10 Essential (primary) hypertension: Secondary | ICD-10-CM | POA: Diagnosis not present

## 2012-09-30 DIAGNOSIS — IMO0001 Reserved for inherently not codable concepts without codable children: Secondary | ICD-10-CM | POA: Diagnosis not present

## 2012-09-30 DIAGNOSIS — E039 Hypothyroidism, unspecified: Secondary | ICD-10-CM | POA: Diagnosis not present

## 2012-10-03 ENCOUNTER — Other Ambulatory Visit: Payer: Self-pay | Admitting: Pulmonary Disease

## 2012-10-08 DIAGNOSIS — E119 Type 2 diabetes mellitus without complications: Secondary | ICD-10-CM | POA: Diagnosis not present

## 2012-10-08 DIAGNOSIS — H353290 Exudative age-related macular degeneration, unspecified eye, stage unspecified: Secondary | ICD-10-CM | POA: Diagnosis not present

## 2012-10-08 DIAGNOSIS — Z961 Presence of intraocular lens: Secondary | ICD-10-CM | POA: Diagnosis not present

## 2012-10-08 DIAGNOSIS — H3530 Unspecified macular degeneration: Secondary | ICD-10-CM | POA: Diagnosis not present

## 2012-10-08 DIAGNOSIS — H35059 Retinal neovascularization, unspecified, unspecified eye: Secondary | ICD-10-CM | POA: Diagnosis not present

## 2012-10-10 ENCOUNTER — Other Ambulatory Visit: Payer: Self-pay | Admitting: Pulmonary Disease

## 2012-11-04 DIAGNOSIS — L940 Localized scleroderma [morphea]: Secondary | ICD-10-CM | POA: Diagnosis not present

## 2012-11-04 DIAGNOSIS — G5600 Carpal tunnel syndrome, unspecified upper limb: Secondary | ICD-10-CM | POA: Diagnosis not present

## 2012-11-20 DIAGNOSIS — Z1231 Encounter for screening mammogram for malignant neoplasm of breast: Secondary | ICD-10-CM | POA: Diagnosis not present

## 2012-11-27 DIAGNOSIS — N390 Urinary tract infection, site not specified: Secondary | ICD-10-CM | POA: Diagnosis not present

## 2013-01-14 ENCOUNTER — Other Ambulatory Visit: Payer: Self-pay | Admitting: *Deleted

## 2013-01-14 MED ORDER — ESOMEPRAZOLE MAGNESIUM 40 MG PO CPDR: 40.0000 mg | DELAYED_RELEASE_CAPSULE | Freq: Every day | ORAL | Status: DC

## 2013-01-27 DIAGNOSIS — I10 Essential (primary) hypertension: Secondary | ICD-10-CM | POA: Diagnosis not present

## 2013-01-27 DIAGNOSIS — E039 Hypothyroidism, unspecified: Secondary | ICD-10-CM | POA: Diagnosis not present

## 2013-01-31 DIAGNOSIS — I252 Old myocardial infarction: Secondary | ICD-10-CM | POA: Diagnosis not present

## 2013-01-31 DIAGNOSIS — E782 Mixed hyperlipidemia: Secondary | ICD-10-CM | POA: Diagnosis not present

## 2013-01-31 DIAGNOSIS — I2510 Atherosclerotic heart disease of native coronary artery without angina pectoris: Secondary | ICD-10-CM | POA: Diagnosis not present

## 2013-01-31 DIAGNOSIS — E1065 Type 1 diabetes mellitus with hyperglycemia: Secondary | ICD-10-CM | POA: Diagnosis not present

## 2013-02-07 DIAGNOSIS — I2510 Atherosclerotic heart disease of native coronary artery without angina pectoris: Secondary | ICD-10-CM | POA: Diagnosis not present

## 2013-02-07 DIAGNOSIS — J45909 Unspecified asthma, uncomplicated: Secondary | ICD-10-CM | POA: Diagnosis not present

## 2013-02-07 DIAGNOSIS — E782 Mixed hyperlipidemia: Secondary | ICD-10-CM | POA: Diagnosis not present

## 2013-02-07 DIAGNOSIS — E109 Type 1 diabetes mellitus without complications: Secondary | ICD-10-CM | POA: Diagnosis not present

## 2013-03-03 DIAGNOSIS — M542 Cervicalgia: Secondary | ICD-10-CM | POA: Diagnosis not present

## 2013-03-03 DIAGNOSIS — M545 Low back pain: Secondary | ICD-10-CM | POA: Diagnosis not present

## 2013-05-12 DIAGNOSIS — I10 Essential (primary) hypertension: Secondary | ICD-10-CM | POA: Diagnosis not present

## 2013-05-12 DIAGNOSIS — E039 Hypothyroidism, unspecified: Secondary | ICD-10-CM | POA: Diagnosis not present

## 2013-05-12 DIAGNOSIS — J0100 Acute maxillary sinusitis, unspecified: Secondary | ICD-10-CM | POA: Diagnosis not present

## 2013-05-15 ENCOUNTER — Ambulatory Visit (INDEPENDENT_AMBULATORY_CARE_PROVIDER_SITE_OTHER): Payer: Medicare Other | Admitting: Internal Medicine

## 2013-05-15 VITALS — BP 118/54 | HR 86 | Ht 59.0 in | Wt 131.0 lb

## 2013-05-15 DIAGNOSIS — J329 Chronic sinusitis, unspecified: Secondary | ICD-10-CM | POA: Diagnosis not present

## 2013-05-15 DIAGNOSIS — I2510 Atherosclerotic heart disease of native coronary artery without angina pectoris: Secondary | ICD-10-CM

## 2013-05-15 LAB — LIPID PANEL
Cholesterol: 208 mg/dL — ABNORMAL HIGH (ref 0–200)
HDL: 57 mg/dL (ref >39.00)
Total CHOL/HDL Ratio: 4
Triglycerides: 210 mg/dL — ABNORMAL HIGH (ref 0.0–149.0)
VLDL: 42 mg/dL — ABNORMAL HIGH (ref 0.0–40.0)

## 2013-05-15 LAB — BASIC METABOLIC PANEL
BUN: 17 mg/dL (ref 6–23)
CO2: 29 mEq/L (ref 19–32)
Calcium: 9 mg/dL (ref 8.4–10.5)
Chloride: 95 mEq/L — ABNORMAL LOW (ref 96–112)
Creatinine, Ser: 0.9 mg/dL (ref 0.4–1.2)
GFR: 68.34 mL/min (ref >60.00)
Glucose, Bld: 131 mg/dL — ABNORMAL HIGH (ref 70–99)
Potassium: 3.8 mEq/L (ref 3.5–5.1)
Sodium: 132 mEq/L — ABNORMAL LOW (ref 135–145)

## 2013-05-15 LAB — MAGNESIUM: Magnesium: 1.9 mg/dL (ref 1.5–2.5)

## 2013-05-15 LAB — LDL CHOLESTEROL, DIRECT: Direct LDL: 127.6 mg/dL

## 2013-05-15 NOTE — Progress Notes (Signed)
HPI Patient is an 79 year old with a history of BOOP and  CAD (s/p IWMI with stent ot RCA. repeat stnet to the RCA for instent restenois. She is also s/p DES to L main. Last cath In summer 2011 showed: 40% LAD lesion; 50% Lcx; small OM1 with 80% lesion; RCA with 20% instent restenosis This was stable. Pt continued on medical Rx. She was last in cardiology clnic last March   Since seen she has done well She feels good  No CP or SOB She is taking care of 2 great grandchildren.   Allergies  Allergen Reactions  . Lantus [Insulin Glargine]     Patient states "sulfate binder causes sinus infection/pneumonia".  . Metoprolol-Hydrochlorothiazide     REACTION: decreased bp  . Novolog [Insulin Aspart]     Patient states "sulfate binder causes sinus infection/pneumonia". Tolerates Humalog.  . Sulfonamide Derivatives Hives  . Titanium     Cervical plate - had to change for stainless steel  . Adhesive [Tape] Hives and Rash    Current Outpatient Prescriptions  Medication Sig Dispense Refill  . amLODipine (NORVASC) 10 MG tablet Take 10 mg by mouth every morning. Once a day      . amoxicillin-clavulanate (AUGMENTIN) 125-31.25 MG/5ML suspension Take 125 mg by mouth 3 (three) times daily.      [name] aspirin 325 MG tablet Take 325 mg by mouth 2 (two) times daily.       . beta carotene w/minerals (OCUVITE) tablet Take 1 tablet by mouth daily.      [name] esomeprazole (NEXIUM) 40 MG capsule Take 1 capsule (40 mg total) by mouth daily before breakfast.  30 capsule  6  . ferrous sulfate 325 (65 FE) MG tablet Take 1 tablet (325 mg total) by mouth 2 (two) times daily with a meal.  60 tablet  1  . imipramine (TOFRANIL) 25 MG tablet Take 75 mg by mouth at bedtime.       . insulin lispro (HUMALOG) 100 UNIT/ML injection Inject 5 Units into the skin 2 (two) times daily. DO NOT SUBSTITUTE PER PATIENT REQUEST      . levothyroxine (SYNTHROID, LEVOTHROID) 125 MCG tablet Take 125 mcg by mouth every morning.       . mometasone  (NASONEX) 50 MCG/ACT nasal spray Place 2 sprays into the nose daily. As needed for congestion.      . predniSONE (DELTASONE) 5 MG tablet Take 5 mg by mouth daily.      . ticlopidine (TICLID) 250 MG tablet Take 1 tablet (250 mg total) by mouth 2 (two) times daily.  60 tablet  5  . triazolam (HALCION) 0.25 MG tablet Take 0.25 mg by mouth at bedtime as needed. For sleep      . potassium chloride SA (K-DUR,KLOR-CON) 20 MEQ tablet Take 2 tablets (40 mEq total) by mouth daily.  5 tablet  0   No current facility-administered medications for this visit.    Past Medical History  Diagnosis Date  . Coronary artery disease   . Dressler's syndrome   . Hypertension   . Dyslipidemia   . Diabetes mellitus   . CVA (cerebral infarction) 1991  . Macular degeneration   . GERD (gastroesophageal reflux disease)   . Diverticulosis   . IBS (irritable bowel syndrome)   . Zenker's diverticulum   . Emphysema   . Pulmonary hypertension   . Chronic sinusitis   . Fibromyalgia   . Hypothyroidism   . Sjogren's disease   . Raynaud's  phenomenon   . Cervical disc disease   . Adenomatous colon polyp   . Scleroderma   . COPD (chronic obstructive pulmonary disease)   . Arthritis   . Blood transfusion   . Myocardial infarction may 27th 2007  . Pneumonia      HX PNEUMONIA  . Depression   . 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 Cancer     cervical cancer pre hysterectomy  . Sleep apnea     STOP BANG SCORE 5  . Legally blind   . CONSTIPATION, CHRONIC 06/26/2007    Qualifier: Diagnosis of  By: Koleen Distance CMA Duncan Dull), Hulan Saas      Past Surgical History  Procedure Laterality Date  . Total abdominal hysterectomy  1973  . Bilateral oophorectomy  2002  . Spinal fusion  1997    and implantation of a plate   . Lobe thyroid removal    . Thyroid lobectomy  1991    removal of left lobe thyroid  . Cholecystectomy  1965  . Appendectomy    . Cataract extraction    . Bronchoscopy  02-22-09  . Stented  cardiac  artery  2007 / 2007 / 2010    X 3 STENT  . Proctoscopy  03/18/2012    Procedure: PROCTOSCOPY;  Surgeon: [name] Sportsman, MD;  Location: [facility];  Service: General;  Laterality: N/A;  rigid procto    Family History  Problem Relation Age of Onset  . Other Mother 31    Died from sepsis  . Stroke Father 64    died  . Heart disease Father   . Cancer Sister     lung  . Cancer Brother     lung    History   Social History  . Marital Status: Divorced    Spouse Name: N/A    Number of Children: N/A  . Years of Education: N/A   Occupational History  . retired    Social History Main Topics  . Smoking status: Former Smoker -- 1.00 packs/day for 40 years    Types: Cigarettes    Quit date: 06/26/1988  . Smokeless tobacco: Never Used     Comment: 40 pack years  . Alcohol Use: No  . Drug Use: No  . Sexual Activity: Not on file   Other Topics Concern  . Not on file   Social History Narrative  . No narrative on file    Review of Systems:  All systems reviewed.  They are negative to the above problem except as previously stated.  Vital Signs: BP 118/54  Pulse 86  Ht 4\' 11"  (1.499 m)  Wt 131 lb (59.421 kg)  BMI 26.44 kg/m2  Physical Exam Patient is in NAD HEENT:  Normocephalic, atraumatic. EOMI, PERRLA.  Neck: JVP is normal.  No bruits.  Lungs: clear to auscultation. No rales no wheezes.  Heart: Regular rate and rhythm. Normal S1, S2. No S3.   No significant murmurs. PMI not displaced.  Abdomen:  Supple, nontender. Normal bowel sounds. No masses. No hepatomegaly.  Extremities:   Good distal pulses throughout. No lower extremity edema.  Musculoskeletal :moving all extremities.  Neuro:   alert and oriented x3.  CN II-XII grossly intact.  EKG  SR 86 bpm.  T wave inversion inferior and laterally  Old  Assessment and Plan:  1.  CAD  Doing well  No symptoms of angina.  Keep on same regimen  2.  HL  Not  on statin  Has not tolerated in past  Will check labs  today  3.  HTN  Keep on same regimen.  Check BMET.  Check Mg as well  She is not on K supplement

## 2013-05-15 NOTE — Patient Instructions (Signed)
Your physician wants you to follow-up in:  June 2015 You will receive a reminder letter in the mail two months in advance. If you don't receive a letter, please call our office to schedule the follow-up appointment.  Your physician recommends that you continue on your current medications as directed. Please refer to the Current Medication list given to you today.

## 2013-05-16 ENCOUNTER — Telehealth: Payer: Self-pay | Admitting: Nurse Practitioner

## 2013-05-16 MED ORDER — TICLOPIDINE HCL 250 MG PO TABS: 250.0000 mg | ORAL_TABLET | Freq: Two times a day (BID) | ORAL | Status: DC

## 2013-05-16 NOTE — Telephone Encounter (Signed)
Message copied by Levi Aland on Fri May 16, 2013  3:29 PM ------      Message from: [place], [place] V      Created: Fri May 16, 2013 12:00 AM       K and Mg are normal.  Electrolytes stable      LDL is higher than should be at 127      Has not tolerated statins in past.  Would she consider trying Zetia? ------

## 2013-05-16 NOTE — Telephone Encounter (Signed)
Patient willing to start Zetia 10 mg QD.  I have placed samples at front desk for patient; patient aware.  Patient informed me that [facility] called her to let her know that they can no longer get Ticlid.  I talked with Dr. Tenny Craw and we elected to call other pharmacies to try to locate another pharmacy that has this medication in stock as Dr. Tenny Craw advises that patient does not tolerate medication changes very well.  I located the medication at [facility] and rx was called in to the pharmacist.  I spoke with patient and advised her of location of Ticlid Rx and that pharmacist said he had the medication in stock.  Patient verbalized understanding and thanked me for my help.

## 2013-06-15 ENCOUNTER — Emergency Department (INDEPENDENT_AMBULATORY_CARE_PROVIDER_SITE_OTHER)
Admission: EM | Admit: 2013-06-15 | Discharge: 2013-06-15 | Disposition: A | Discharge status: Nursing Home (Includes ICF) | Payer: Medicare Other | Source: Home / Self Care | Attending: Family Medicine | Admitting: Family Medicine

## 2013-06-15 ENCOUNTER — Encounter (HOSPITAL_COMMUNITY): Payer: Self-pay | Admitting: Emergency Medicine

## 2013-06-15 ENCOUNTER — Emergency Department (HOSPITAL_COMMUNITY): Payer: Medicare Other

## 2013-06-15 ENCOUNTER — Inpatient Hospital Stay (HOSPITAL_COMMUNITY)
Admission: EM | Admit: 2013-06-15 | Discharge: 2013-06-19 | DRG: 178 | Disposition: A | Discharge status: Home / Self Care | Payer: Medicare Other | Attending: Internal Medicine | Admitting: Internal Medicine

## 2013-06-15 DIAGNOSIS — J8409 Other alveolar and parieto-alveolar conditions: Secondary | ICD-10-CM | POA: Diagnosis not present

## 2013-06-15 DIAGNOSIS — G4730 Sleep apnea, unspecified: Secondary | ICD-10-CM | POA: Diagnosis present

## 2013-06-15 DIAGNOSIS — I241 Dressler's syndrome: Secondary | ICD-10-CM | POA: Diagnosis present

## 2013-06-15 DIAGNOSIS — D638 Anemia in other chronic diseases classified elsewhere: Secondary | ICD-10-CM

## 2013-06-15 DIAGNOSIS — J310 Chronic rhinitis: Secondary | ICD-10-CM

## 2013-06-15 DIAGNOSIS — K589 Irritable bowel syndrome without diarrhea: Secondary | ICD-10-CM

## 2013-06-15 DIAGNOSIS — D649 Anemia, unspecified: Secondary | ICD-10-CM | POA: Diagnosis not present

## 2013-06-15 DIAGNOSIS — Z87891 Personal history of nicotine dependence: Secondary | ICD-10-CM | POA: Diagnosis not present

## 2013-06-15 DIAGNOSIS — Z8541 Personal history of malignant neoplasm of cervix uteri: Secondary | ICD-10-CM

## 2013-06-15 DIAGNOSIS — E7800 Pure hypercholesterolemia, unspecified: Secondary | ICD-10-CM

## 2013-06-15 DIAGNOSIS — E119 Type 2 diabetes mellitus without complications: Secondary | ICD-10-CM

## 2013-06-15 DIAGNOSIS — K219 Gastro-esophageal reflux disease without esophagitis: Secondary | ICD-10-CM | POA: Diagnosis present

## 2013-06-15 DIAGNOSIS — Z8673 Personal history of transient ischemic attack (TIA), and cerebral infarction without residual deficits: Secondary | ICD-10-CM | POA: Diagnosis not present

## 2013-06-15 DIAGNOSIS — F3289 Other specified depressive episodes: Secondary | ICD-10-CM

## 2013-06-15 DIAGNOSIS — J441 Chronic obstructive pulmonary disease with (acute) exacerbation: Secondary | ICD-10-CM

## 2013-06-15 DIAGNOSIS — J189 Pneumonia, unspecified organism: Secondary | ICD-10-CM

## 2013-06-15 DIAGNOSIS — Z7982 Long term (current) use of aspirin: Secondary | ICD-10-CM

## 2013-06-15 DIAGNOSIS — R1319 Other dysphagia: Secondary | ICD-10-CM

## 2013-06-15 DIAGNOSIS — Z79899 Other long term (current) drug therapy: Secondary | ICD-10-CM

## 2013-06-15 DIAGNOSIS — E785 Hyperlipidemia, unspecified: Secondary | ICD-10-CM

## 2013-06-15 DIAGNOSIS — K56699 Other intestinal obstruction unspecified as to partial versus complete obstruction: Secondary | ICD-10-CM

## 2013-06-15 DIAGNOSIS — E039 Hypothyroidism, unspecified: Secondary | ICD-10-CM

## 2013-06-15 DIAGNOSIS — H548 Legal blindness, as defined in USA: Secondary | ICD-10-CM

## 2013-06-15 DIAGNOSIS — K5730 Diverticulosis of large intestine without perforation or abscess without bleeding: Secondary | ICD-10-CM

## 2013-06-15 DIAGNOSIS — K224 Dyskinesia of esophagus: Secondary | ICD-10-CM | POA: Diagnosis present

## 2013-06-15 DIAGNOSIS — I2789 Other specified pulmonary heart diseases: Secondary | ICD-10-CM | POA: Diagnosis present

## 2013-06-15 DIAGNOSIS — J8410 Pulmonary fibrosis, unspecified: Secondary | ICD-10-CM

## 2013-06-15 DIAGNOSIS — K225 Diverticulum of esophagus, acquired: Secondary | ICD-10-CM

## 2013-06-15 DIAGNOSIS — D62 Acute posthemorrhagic anemia: Secondary | ICD-10-CM

## 2013-06-15 DIAGNOSIS — M797 Fibromyalgia: Secondary | ICD-10-CM

## 2013-06-15 DIAGNOSIS — R0602 Shortness of breath: Secondary | ICD-10-CM | POA: Diagnosis not present

## 2013-06-15 DIAGNOSIS — R1310 Dysphagia, unspecified: Secondary | ICD-10-CM | POA: Diagnosis not present

## 2013-06-15 DIAGNOSIS — I2510 Atherosclerotic heart disease of native coronary artery without angina pectoris: Secondary | ICD-10-CM

## 2013-06-15 DIAGNOSIS — F329 Major depressive disorder, single episode, unspecified: Secondary | ICD-10-CM | POA: Diagnosis present

## 2013-06-15 DIAGNOSIS — J8489 Other specified interstitial pulmonary diseases: Secondary | ICD-10-CM

## 2013-06-15 DIAGNOSIS — J690 Pneumonitis due to inhalation of food and vomit: Principal | ICD-10-CM

## 2013-06-15 DIAGNOSIS — R05 Cough: Secondary | ICD-10-CM | POA: Diagnosis not present

## 2013-06-15 DIAGNOSIS — IMO0002 Reserved for concepts with insufficient information to code with codable children: Secondary | ICD-10-CM

## 2013-06-15 DIAGNOSIS — F4489 Other dissociative and conversion disorders: Secondary | ICD-10-CM

## 2013-06-15 DIAGNOSIS — J438 Other emphysema: Secondary | ICD-10-CM

## 2013-06-15 DIAGNOSIS — K222 Esophageal obstruction: Secondary | ICD-10-CM

## 2013-06-15 DIAGNOSIS — I252 Old myocardial infarction: Secondary | ICD-10-CM | POA: Diagnosis not present

## 2013-06-15 DIAGNOSIS — M349 Systemic sclerosis, unspecified: Secondary | ICD-10-CM

## 2013-06-15 DIAGNOSIS — R32 Unspecified urinary incontinence: Secondary | ICD-10-CM

## 2013-06-15 DIAGNOSIS — I10 Essential (primary) hypertension: Secondary | ICD-10-CM

## 2013-06-15 LAB — POCT I-STAT TROPONIN I: Troponin i, poc: 0 ng/mL (ref 0.00–0.08)

## 2013-06-15 LAB — BASIC METABOLIC PANEL
BUN: 10 mg/dL (ref 6–23)
CO2: 28 mEq/L (ref 19–32)
Calcium: 9 mg/dL (ref 8.4–10.5)
Chloride: 94 mEq/L — ABNORMAL LOW (ref 96–112)
Creatinine, Ser: 0.7 mg/dL (ref 0.50–1.10)
GFR calc Af Amer: >90 mL/min (ref >90)
GFR calc non Af Amer: 80 mL/min — ABNORMAL LOW (ref >90)
Glucose, Bld: 119 mg/dL — ABNORMAL HIGH (ref 70–99)
Potassium: 3.2 mEq/L — ABNORMAL LOW (ref 3.5–5.1)
Sodium: 133 mEq/L — ABNORMAL LOW (ref 135–145)

## 2013-06-15 LAB — CBC
HCT: 36 % (ref 36.0–46.0)
Hemoglobin: 12 g/dL (ref 12.0–15.0)
MCH: 28.2 pg (ref 26.0–34.0)
MCHC: 33.3 g/dL (ref 30.0–36.0)
MCV: 84.7 fL (ref 78.0–100.0)
Platelets: 261 10*3/uL (ref 150–400)
RBC: 4.25 MIL/uL (ref 3.87–5.11)
RDW: 13.4 % (ref 11.5–15.5)
WBC: 11.1 10*3/uL — ABNORMAL HIGH (ref 4.0–10.5)

## 2013-06-15 LAB — PRO B NATRIURETIC PEPTIDE: Pro B Natriuretic peptide (BNP): 190.2 pg/mL (ref 0–450)

## 2013-06-15 MED ORDER — IMIPRAMINE HCL 25 MG PO TABS
75.0000 mg | ORAL_TABLET | Freq: Every day | ORAL | Status: DC
  Administered 2013-06-16 – 2013-06-18 (×4): 75 mg via ORAL
  Filled 2013-06-15 (×5): qty 1

## 2013-06-15 MED ORDER — VANCOMYCIN HCL 500 MG IV SOLR
500.0000 mg | Freq: Two times a day (BID) | INTRAVENOUS | Status: DC
  Administered 2013-06-16 – 2013-06-18 (×4): 500 mg via INTRAVENOUS
  Filled 2013-06-15 (×5): qty 500

## 2013-06-15 MED ORDER — SODIUM CHLORIDE 0.9 % IV SOLN
INTRAVENOUS | Status: AC
  Administered 2013-06-16: via INTRAVENOUS

## 2013-06-15 MED ORDER — PANTOPRAZOLE SODIUM 40 MG PO TBEC
40.0000 mg | DELAYED_RELEASE_TABLET | Freq: Every day | ORAL | Status: DC
  Administered 2013-06-16 – 2013-06-19 (×3): 40 mg via ORAL
  Filled 2013-06-15 (×3): qty 1

## 2013-06-15 MED ORDER — IBUPROFEN 600 MG PO TABS
600.0000 mg | ORAL_TABLET | Freq: Four times a day (QID) | ORAL | Status: DC | PRN
  Administered 2013-06-16 – 2013-06-18 (×3): 600 mg via ORAL
  Filled 2013-06-15 (×4): qty 1

## 2013-06-15 MED ORDER — TICLOPIDINE HCL 250 MG PO TABS
250.0000 mg | ORAL_TABLET | Freq: Two times a day (BID) | ORAL | Status: DC
  Administered 2013-06-16 – 2013-06-17 (×5): 250 mg via ORAL
  Filled 2013-06-15 (×7): qty 1

## 2013-06-15 MED ORDER — PIPERACILLIN-TAZOBACTAM 3.375 G IVPB
3.3750 g | Freq: Once | INTRAVENOUS | Status: AC
  Administered 2013-06-15: 3.375 g via INTRAVENOUS
  Filled 2013-06-15: qty 50

## 2013-06-15 MED ORDER — INSULIN ASPART 100 UNIT/ML ~~LOC~~ SOLN: 0.0000 [IU] | Freq: Three times a day (TID) | SUBCUTANEOUS | Status: DC

## 2013-06-15 MED ORDER — ENOXAPARIN SODIUM 40 MG/0.4ML ~~LOC~~ SOLN
40.0000 mg | SUBCUTANEOUS | Status: DC
  Administered 2013-06-16 – 2013-06-17 (×2): 40 mg via SUBCUTANEOUS
  Filled 2013-06-15 (×3): qty 0.4

## 2013-06-15 MED ORDER — INSULIN ASPART 100 UNIT/ML ~~LOC~~ SOLN: 0.0000 [IU] | Freq: Every day | SUBCUTANEOUS | Status: DC

## 2013-06-15 MED ORDER — LEVOTHYROXINE SODIUM 125 MCG PO TABS
125.0000 ug | ORAL_TABLET | Freq: Every day | ORAL | Status: DC
  Administered 2013-06-16 – 2013-06-19 (×3): 125 ug via ORAL
  Filled 2013-06-15 (×5): qty 1

## 2013-06-15 MED ORDER — METHYLPREDNISOLONE SODIUM SUCC 125 MG IJ SOLR
60.0000 mg | Freq: Two times a day (BID) | INTRAMUSCULAR | Status: DC
  Administered 2013-06-16 (×3): 60 mg via INTRAVENOUS
  Filled 2013-06-15 (×5): qty 0.96

## 2013-06-15 MED ORDER — DEXTROSE 5 % IV SOLN
1.0000 g | Freq: Two times a day (BID) | INTRAVENOUS | Status: DC
  Administered 2013-06-16: 1 g via INTRAVENOUS
  Filled 2013-06-15 (×3): qty 1

## 2013-06-15 MED ORDER — ASPIRIN 325 MG PO TABS
325.0000 mg | ORAL_TABLET | Freq: Two times a day (BID) | ORAL | Status: DC
  Administered 2013-06-16 – 2013-06-19 (×7): 325 mg via ORAL
  Filled 2013-06-15 (×9): qty 1

## 2013-06-15 MED ORDER — VANCOMYCIN HCL IN DEXTROSE 1-5 GM/200ML-% IV SOLN
1000.0000 mg | Freq: Once | INTRAVENOUS | Status: AC
  Administered 2013-06-15: 1000 mg via INTRAVENOUS
  Filled 2013-06-15: qty 200

## 2013-06-15 MED ORDER — ZOLPIDEM TARTRATE 5 MG PO TABS
5.0000 mg | ORAL_TABLET | Freq: Every evening | ORAL | Status: DC | PRN
  Administered 2013-06-16 – 2013-06-18 (×4): 5 mg via ORAL
  Filled 2013-06-15 (×4): qty 1

## 2013-06-15 NOTE — ED Provider Notes (Signed)
CSN: [id number]     Arrival date & time 06/15/13  1806 History   First MD Initiated Contact with Patient 06/15/13 1835     Chief Complaint  Patient presents with  . Cough  . Shortness of Breath  . Headache   (Consider location/radiation/quality/duration/timing/severity/associated sxs/prior Treatment) Patient is a 79 y.o. female presenting with cough. The history is provided by the patient. No language interpreter was used.  Cough Cough characteristics:  Productive Sputum characteristics:  Nondescript Severity:  Moderate Duration:  2 days Timing:  Constant Progression:  Worsening Chronicity:  New Smoker: no   Relieved by:  Nothing Worsened by:  Nothing tried Ineffective treatments: antibiotics. Associated symptoms: fever and shortness of breath   Risk factors: no recent infection   Pt reports she has had a cough for a month.   Pt reports she is getting worse.   Pt took a week of ceftin, a week of zithromax and is on day 5 of a second dose of ceftin.   Pt sees Dr. Nicholos Johns.  Pt reports she has had pneumonia in the past and feels the same way.     Past Medical History  Diagnosis Date  . Coronary artery disease   . Dressler's syndrome   . Hypertension   . Dyslipidemia   . Diabetes mellitus   . CVA (cerebral infarction) 1991  . Macular degeneration   . GERD (gastroesophageal reflux disease)   . Diverticulosis   . IBS (irritable bowel syndrome)   . Zenker's diverticulum   . Emphysema   . Pulmonary hypertension   . Chronic sinusitis   . Fibromyalgia   . Hypothyroidism   . Sjogren's disease   . Raynaud's phenomenon   . Cervical disc disease   . Adenomatous colon polyp   . Scleroderma   . COPD (chronic obstructive pulmonary disease)   . Arthritis   . Blood transfusion   . Myocardial infarction may 27th 2007  . Pneumonia      HX PNEUMONIA  . Depression   . 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 Cancer     cervical cancer pre hysterectomy  .  Sleep apnea     STOP BANG SCORE 5  . Legally blind   . CONSTIPATION, CHRONIC 06/26/2007    Qualifier: Diagnosis of  By: Koleen Distance CMA Duncan Dull), Hulan Saas     Past Surgical History  Procedure Laterality Date  . Total abdominal hysterectomy  1973  . Bilateral oophorectomy  2002  . Spinal fusion  1997    and implantation of a plate   . Lobe thyroid removal    . Thyroid lobectomy  1991    removal of left lobe thyroid  . Cholecystectomy  1965  . Appendectomy    . Cataract extraction    . Bronchoscopy  02-22-09  . Stented cardiac  artery  2007 / 2007 / 2010    X 3 STENT  . Proctoscopy  03/18/2012    Procedure: PROCTOSCOPY;  Surgeon: [name] Sportsman, MD;  Location: [facility];  Service: General;  Laterality: N/A;  rigid procto  . Colon surgery  2014    colon resection   Family History  Problem Relation Age of Onset  . Other Mother 27    Died from sepsis  . Stroke Father 60    died  . Heart disease Father   . Cancer Sister     lung  . Cancer Brother     lung  History  Substance Use Topics  . Smoking status: Former Smoker -- 1.00 packs/day for 40 years    Types: Cigarettes    Quit date: 06/26/1988  . Smokeless tobacco: Never Used     Comment: 40 pack years  . Alcohol Use: No   OB History   Grav Para Term Preterm Abortions TAB SAB Ect Mult Living                 Review of Systems  Constitutional: Positive for fever.  Respiratory: Positive for cough and shortness of breath.   All other systems reviewed and are negative.    Allergies  Lantus; Metoprolol-hydrochlorothiazide; Novolog; Sulfonamide derivatives; Titanium; and Adhesive  Home Medications   Current Outpatient Rx  Name  Route  Sig  Dispense  Refill  . amLODipine (NORVASC) 10 MG tablet   Oral   Take 10 mg by mouth every morning. Once a day         . aspirin 325 MG tablet   Oral   Take 325 mg by mouth 2 (two) times daily.          . beta carotene w/minerals (OCUVITE) tablet   Oral   Take 1 tablet by mouth  daily.         . cefUROXime (CEFTIN) 500 MG tablet   Oral   Take 500 mg by mouth 2 (two) times daily with a meal. For 10 days         . esomeprazole (NEXIUM) 40 MG capsule   Oral   Take 1 capsule (40 mg total) by mouth daily before breakfast.   30 capsule   6   . imipramine (TOFRANIL) 25 MG tablet   Oral   Take 75 mg by mouth at bedtime.          . insulin lispro (HUMALOG) 100 UNIT/ML injection   Subcutaneous   Inject 6 Units into the skin 2 (two) times daily. DO NOT SUBSTITUTE PER PATIENT REQUEST         . levothyroxine (SYNTHROID, LEVOTHROID) 125 MCG tablet   Oral   Take 125 mcg by mouth every morning.          . predniSONE (DELTASONE) 5 MG tablet   Oral   Take 5 mg by mouth daily.         . ticlopidine (TICLID) 250 MG tablet   Oral   Take 1 tablet (250 mg total) by mouth 2 (two) times daily.   60 tablet   5   . triazolam (HALCION) 0.25 MG tablet   Oral   Take 0.25 mg by mouth at bedtime as needed. For sleep         . amoxicillin-clavulanate (AUGMENTIN) 125-31.25 MG/5ML suspension   Oral   Take 125 mg by mouth 3 (three) times daily.         . mometasone (NASONEX) 50 MCG/ACT nasal spray   Nasal   Place 2 sprays into the nose daily. As needed for congestion.          BP 137/46  Pulse 85  Temp(Src) 98.8 F (37.1 C) (Oral)  Resp 21  Ht 5' 2.5" (1.588 m)  Wt 132 lb (59.875 kg)  BMI 23.74 kg/m2  SpO2 97% Physical Exam  Nursing note and vitals reviewed. Constitutional: She appears well-developed and well-nourished.  HENT:  Head: Normocephalic.  Right Ear: External ear normal.  Left Ear: External ear normal.  Nose: Nose normal.  Mouth/Throat: Oropharynx is clear and moist.  Eyes: Conjunctivae are normal. Pupils are equal, round, and reactive to light.  Neck: Normal range of motion. Neck supple.  Cardiovascular: Normal rate.   Pulmonary/Chest: Breath sounds normal.  Abdominal: Soft. Bowel sounds are normal.  Musculoskeletal: Normal  range of motion.  Neurological: She is alert.  Skin: Skin is warm.  Psychiatric: She has a normal mood and affect.    ED Course  Procedures (including critical care time) Labs Review Labs Reviewed  BASIC METABOLIC PANEL - Abnormal; Notable for the following:    Sodium 133 (*)    Potassium 3.2 (*)    Chloride 94 (*)    Glucose, Bld 119 (*)    GFR calc non Af Amer 80 (*)    All other components within normal limits  CBC - Abnormal; Notable for the following:    WBC 11.1 (*)    All other components within normal limits  PRO B NATRIURETIC PEPTIDE  POCT I-STAT TROPONIN I   Imaging Review Dg Chest 2 View  06/15/2013   CLINICAL DATA:  Weakness with short of breath and headaches.  EXAM: CHEST  2 VIEW  COMPARISON:  03/19/2012.  FINDINGS: Patchy airspace disease is present in the right upper lobe compatible with pneumonia. Chronic interstitial prominence. The cardiopericardial silhouette is within normal limits. Surgical clips at the thoracic inlet. Mediastinal contours are within normal limits, unchanged. Aortic arch atherosclerosis. Emphysema is present. Blunting of both costophrenic angles. Coronary artery stent noted.  IMPRESSION: Right upper lobe pneumonia. Followup to ensure radiographic clearing and exclude an underlying lesion is recommended. Typically clearing will be observed at 8 weeks. Underlying emphysema.   Electronically Signed   By: Andreas Newport M.D.   On: 06/15/2013 20:01    EKG Interpretation    Date/Time:  Sunday June 15 2013 18:23:35 EST Ventricular Rate:  91 PR Interval:  148 QRS Duration: 68 QT Interval:  322 QTC Calculation: 396 R Axis:   69 Text Interpretation:  Normal sinus rhythm ST \\T \ T wave abnormality, consider inferior ischemia ST \\T \ T wave abnormality, consider anterolateral ischemia Similar to prior Confirmed by Gwendolyn Grant  MD, BLAIR ([id number]) on 06/15/2013 6:37:12 PM            MDM   1. Community acquired pneumonia    Pt given iv  antibiotics vancomycin and zosyn.   I called Dr. Allena Katz triad hospitalist who will see pt here    Elson Areas, PA-C 06/15/13 2124

## 2013-06-15 NOTE — ED Notes (Signed)
Pt with extensive pulmonary hx to ED via UC c/o cough, low grade fever, sob and headaches.  Pt has taken 2 different kinds of antibiotics in the last month with no relief.  Sats of 92% on RA.  Pt c/o of increasing sob with activity.  Denies chest pain or extremity edema.

## 2013-06-15 NOTE — H&P (Signed)
[facility] History and Physical  Patient: [name]  RUE:[id number]  DOB: 1930/08/04  DOS: the patient was seen and examined on 06/15/2013 PCP: Rama Georgianne Fick) Sofie Hartigan, MD  Chief Complaint: Cough and shortness of breath  HPI: [name] is a 79 y.o. female with Past medical history of coronary artery disease, scleroderma, crest syndrome, and strength is diverticulum, GERD, diabetes mellitus, hypertension, CVA, hypothyroidism, cryptogenic organizing pneumonia. The patient is coming from home. The patient presents with complaints of cough low-grade fever shortness of breath and headache which has been ongoing since Thanksgiving. As per the patient she has history of surgery on her esophagus and has a fistula due to which she has once a month episodes of aspiration. Around Thanksgiving she had an episode of aspiration. One week before that she started having complaints of cough and shortness of breath with low-grade fever and the symptom worsened after that episode of aspiration. She has been seen by her PCP who initially placed her on Ceftin and since she continued to have the similar symptoms change the antibiotic to Augmentin and then Ceftin again. This has not made any improvement in her symptoms and she continues to have shortness of breath on exertion which is progressively worsening even at rest, it she denies any complaint of chest pain palpitation dizziness lightheadedness nausea or vomiting abdominal pain diarrhea or constipation burning urination leg swelling orthopnea or PND. She also denies any active bleeding or melena  Review of Systems: as mentioned in the history of present illness.  A Comprehensive review of the other systems is negative.  Past Medical History  Diagnosis Date  . Coronary artery disease   . Dressler's syndrome   . Hypertension   . Dyslipidemia   . Diabetes mellitus   . CVA (cerebral infarction) 1991  . Macular degeneration   .  GERD (gastroesophageal reflux disease)   . Diverticulosis   . IBS (irritable bowel syndrome)   . Zenker's diverticulum   . Emphysema   . Pulmonary hypertension   . Chronic sinusitis   . Fibromyalgia   . Hypothyroidism   . Sjogren's disease   . Raynaud's phenomenon   . Cervical disc disease   . Adenomatous colon polyp   . Scleroderma   . COPD (chronic obstructive pulmonary disease)   . Arthritis   . Blood transfusion   . Myocardial infarction may 27th 2007  . Pneumonia      HX PNEUMONIA  . Depression   . 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 Cancer     cervical cancer pre hysterectomy  . Sleep apnea     STOP BANG SCORE 5  . Legally blind   . CONSTIPATION, CHRONIC 06/26/2007    Qualifier: Diagnosis of  By: Koleen Distance CMA Duncan Dull), Hulan Saas     Past Surgical History  Procedure Laterality Date  . Total abdominal hysterectomy  1973  . Bilateral oophorectomy  2002  . Spinal fusion  1997    and implantation of a plate   . Lobe thyroid removal    . Thyroid lobectomy  1991    removal of left lobe thyroid  . Cholecystectomy  1965  . Appendectomy    . Cataract extraction    . Bronchoscopy  02-22-09  . Stented cardiac  artery  2007 / 2007 / 2010    X 3 STENT  . Proctoscopy  03/18/2012    Procedure: PROCTOSCOPY;  Surgeon: [name] Sportsman, MD;  Location: [facility];  Service: General;  Laterality: N/A;  rigid procto  . Colon surgery  2014    colon resection   Social History:  reports that she quit smoking about 24 years ago. Her smoking use included Cigarettes. She has a 40 pack-year smoking history. She has never used smokeless tobacco. She reports that she does not drink alcohol or use illicit drugs. Independent for most of her  ADL.  Allergies  Allergen Reactions  . Lantus [Insulin Glargine]     Patient states "sulfate binder causes sinus infection/pneumonia".  . Metoprolol-Hydrochlorothiazide     REACTION: decreased bp  . Novolog [Insulin Aspart]     Patient  states "sulfate binder causes sinus infection/pneumonia". Tolerates Humalog.  . Sulfonamide Derivatives Hives  . Titanium     Cervical plate - had to change for stainless steel  . Adhesive [Tape] Hives and Rash    Family History  Problem Relation Age of Onset  . Other Mother 54    Died from sepsis  . Stroke Father 88    died  . Heart disease Father   . Cancer Sister     lung  . Cancer Brother     lung    Prior to Admission medications   Medication Sig Start Date End Date Taking? Authorizing Provider  amLODipine (NORVASC) 10 MG tablet Take 10 mg by mouth every morning. Once a day 07/06/11  Yes Historical Provider, MD  aspirin 325 MG tablet Take 325 mg by mouth 2 (two) times daily.    Yes Historical Provider, MD  beta carotene w/minerals (OCUVITE) tablet Take 1 tablet by mouth daily.   Yes Historical Provider, MD  cefUROXime (CEFTIN) 500 MG tablet Take 500 mg by mouth 2 (two) times daily with a meal. For 10 days 06/06/13  Yes Historical Provider, MD  esomeprazole (NEXIUM) 40 MG capsule Take 1 capsule (40 mg total) by mouth daily before breakfast. 01/14/13  Yes Pricilla Riffle, MD  imipramine (TOFRANIL) 25 MG tablet Take 75 mg by mouth at bedtime.  03/22/11  Yes Historical Provider, MD  insulin lispro (HUMALOG) 100 UNIT/ML injection Inject 6 Units into the skin 2 (two) times daily. DO NOT SUBSTITUTE PER PATIENT REQUEST   Yes Historical Provider, MD  levothyroxine (SYNTHROID, LEVOTHROID) 125 MCG tablet Take 125 mcg by mouth every morning.    Yes Historical Provider, MD  predniSONE (DELTASONE) 5 MG tablet Take 5 mg by mouth daily.   Yes Historical Provider, MD  ticlopidine (TICLID) 250 MG tablet Take 1 tablet (250 mg total) by mouth 2 (two) times daily. 05/16/13  Yes Pricilla Riffle, MD  triazolam (HALCION) 0.25 MG tablet Take 0.25 mg by mouth at bedtime as needed. For sleep   Yes Historical Provider, MD  amoxicillin-clavulanate (AUGMENTIN) 125-31.25 MG/5ML suspension Take 125 mg by mouth 3  (three) times daily.    Historical Provider, MD  mometasone (NASONEX) 50 MCG/ACT nasal spray Place 2 sprays into the nose daily. As needed for congestion.    Historical Provider, MD    Physical Exam: Filed Vitals:   06/15/13 1928 06/15/13 2015 06/15/13 2100 06/15/13 2115  BP: 137/46 102/57 138/53 129/50  Pulse: 85 83 86 85  Temp:      TempSrc:      Resp: 21 16 12 20   Height:      Weight:      SpO2: 97% 96% 95% 96%    General: Alert, Awake and Oriented to Time, Place and Person. Appear in mild  distress Eyes: PERRL ENT: Oral Mucosa clear moist Neck: No JVD Cardiovascular: S1 and S2 Present, no Murmur, Peripheral Pulses Present Respiratory: Bilateral Air entry equal and Decreased, dry Crackles, no wheezes Abdomen: Bowel Sound Present, Soft and Non tender Skin: No Rash Extremities: No Pedal edema, no calf tenderness Neurologic: Grossly Unremarkable.  Labs on Admission:  CBC:  Recent Labs Lab 06/15/13 1840  WBC 11.1*  HGB 12.0  HCT 36.0  MCV 84.7  PLT 261    CMP     Component Value Date/Time   NA 133* 06/15/2013 1840   K 3.2* 06/15/2013 1840   CL 94* 06/15/2013 1840   CO2 28 06/15/2013 1840   GLUCOSE 119* 06/15/2013 1840   BUN 10 06/15/2013 1840   CREATININE 0.70 06/15/2013 1840   CREATININE 0.79 04/22/2012 1351   CALCIUM 9.0 06/15/2013 1840   PROT 6.4 04/22/2012 1351   ALBUMIN 3.9 04/22/2012 1351   AST 11 04/22/2012 1351   ALT 9 04/22/2012 1351   ALKPHOS 66 04/22/2012 1351   BILITOT 0.4 04/22/2012 1351   GFRNONAA 80* 06/15/2013 1840   GFRAA >90 06/15/2013 1840    No results found for this basename: LIPASE, AMYLASE,  in the last 168 hours No results found for this basename: AMMONIA,  in the last 168 hours  No results found for this basename: CKTOTAL, CKMB, CKMBINDEX, TROPONINI,  in the last 168 hours BNP (last 3 results)  Recent Labs  06/15/13 1840  PROBNP 190.2    Radiological Exams on Admission: Dg Chest 2 View  06/15/2013   CLINICAL DATA:   Weakness with short of breath and headaches.  EXAM: CHEST  2 VIEW  COMPARISON:  03/19/2012.  FINDINGS: Patchy airspace disease is present in the right upper lobe compatible with pneumonia. Chronic interstitial prominence. The cardiopericardial silhouette is within normal limits. Surgical clips at the thoracic inlet. Mediastinal contours are within normal limits, unchanged. Aortic arch atherosclerosis. Emphysema is present. Blunting of both costophrenic angles. Coronary artery stent noted.  IMPRESSION: Right upper lobe pneumonia. Followup to ensure radiographic clearing and exclude an underlying lesion is recommended. Typically clearing will be observed at 8 weeks. Underlying emphysema.   Electronically Signed   By: Andreas Newport M.D.   On: 06/15/2013 20:01    EKG: Independently reviewed. normal sinus rhythm, nonspecific ST and T waves changes.  Assessment/Plan Principal Problem:   CAP (community acquired pneumonia) Active Problems:   HYPERLIPIDEMIA   HYPERTENSION   CORONARY ARTERY DISEASE   BOOP (bronchiolitis obliterans with organizing pneumonia)   ZENKER'S DIVERTICULUM   Systemic sclerosis   Diabetes mellitus   1. CAP (community acquired pneumonia) The patient is presenting with complaints of cough shortness of breath low-grade fever has been on multiple rounds of antibiotics and her chest x-ray is still showing possible right sided pneumonia with leukocytosis. Since she has been on multiple antibiotics I will at present empirically treat her with broad-spectrum antibiotics, I would also obtain blood cultures urine antigens and sputum cultures as well as influenza antigen. Considering her history of cryptogenic organizing pneumonia it is highly likely that her current presentation is due to the same and therefore I would put her on IV Solu-Medrol 60 mg every 12 hours. This would also go for stress dose of steroids since she has been on oral prednisone since many years. There is high  likelihood that she would be aspirating recurrently as well due to the possible fistula versus Zenker's diverticulum. Therefore I will obtain a video swallowing evaluation after  consulting speech therapy in the morning.  2. Coronary artery disease At the normal EKG and troponin levels it does not appear she has any cardiac issues ongoing I would continue her on aspirin and ticlid  3. Diabetes mellitus Placing her on sliding scale  4. Hypothyroidism Synthroid  GERD Protonix  DVT Prophylaxis: subcutaneous Heparin Nutrition: Cardiac and diabetic diet  Code Status: Full  Family Communication: Daughter was present at bedside, opportunity was given to ask question and all questions were answered satisfactorily at the time of interview. Disposition: Admitted to inpatient in telemetry unit.  Author: Lynden Oxford, MD [facility] Pager: [phone] 06/15/2013, 10:13 PM    If 7PM-7AM, please contact night-coverage [email or web address] Password [email or web address]

## 2013-06-15 NOTE — ED Provider Notes (Signed)
CSN: [id number]     Arrival date & time 06/15/13  1600 History   First MD Initiated Contact with Patient 06/15/13 1659     Chief Complaint  Patient presents with  . Headache  . Fatigue  . Cough   (Consider location/radiation/quality/duration/timing/severity/associated sxs/prior Treatment) HPI Comments: 79 year old female with history of coronary artery disease, COPD (emphysema), BOOP, type 2 diabetes, scleroderma with CREST syndrome presents complaining of persistent COPD exacerbation. She has always treated with 3 rounds of antibiotics by her primary care physician without relief. She was first prescribed Ceftin for 5 days. When this does not work, she was prescribed a Z-Pak. This does not work, she was put back on Ceftin. She is now feeling very fatigued and short of breath and she states she thinks she needs to be in the hospital.  Patient is a 79 y.o. female presenting with headaches and cough.  Headache Associated symptoms: cough, fatigue and fever   Associated symptoms: no abdominal pain, no dizziness, no myalgias, no nausea and no vomiting   Cough Associated symptoms: fever, headaches, shortness of breath and wheezing   Associated symptoms: no chest pain, no chills, no myalgias and no rash     Past Medical History  Diagnosis Date  . Coronary artery disease   . Dressler's syndrome   . Hypertension   . Dyslipidemia   . Diabetes mellitus   . CVA (cerebral infarction) 1991  . Macular degeneration   . GERD (gastroesophageal reflux disease)   . Diverticulosis   . IBS (irritable bowel syndrome)   . Zenker's diverticulum   . Emphysema   . Pulmonary hypertension   . Chronic sinusitis   . Fibromyalgia   . Hypothyroidism   . Sjogren's disease   . Raynaud's phenomenon   . Cervical disc disease   . Adenomatous colon polyp   . Scleroderma   . COPD (chronic obstructive pulmonary disease)   . Arthritis   . Blood transfusion   . Myocardial infarction may 27th 2007  . Pneumonia       HX PNEUMONIA  . Depression   . 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 Cancer     cervical cancer pre hysterectomy  . Sleep apnea     STOP BANG SCORE 5  . Legally blind   . CONSTIPATION, CHRONIC 06/26/2007    Qualifier: Diagnosis of  By: Koleen Distance CMA Duncan Dull), Hulan Saas     Past Surgical History  Procedure Laterality Date  . Total abdominal hysterectomy  1973  . Bilateral oophorectomy  2002  . Spinal fusion  1997    and implantation of a plate   . Lobe thyroid removal    . Thyroid lobectomy  1991    removal of left lobe thyroid  . Cholecystectomy  1965  . Appendectomy    . Cataract extraction    . Bronchoscopy  02-22-09  . Stented cardiac  artery  2007 / 2007 / 2010    X 3 STENT  . Proctoscopy  03/18/2012    Procedure: PROCTOSCOPY;  Surgeon: [name] Sportsman, MD;  Location: [facility];  Service: General;  Laterality: N/A;  rigid procto   Family History  Problem Relation Age of Onset  . Other Mother 72    Died from sepsis  . Stroke Father 55    died  . Heart disease Father   . Cancer Sister     lung  . Cancer Brother     lung  History  Substance Use Topics  . Smoking status: Former Smoker -- 1.00 packs/day for 40 years    Types: Cigarettes    Quit date: 06/26/1988  . Smokeless tobacco: Never Used     Comment: 40 pack years  . Alcohol Use: No   OB History   Grav Para Term Preterm Abortions TAB SAB Ect Mult Living                 Review of Systems  Constitutional: Positive for fever and fatigue. Negative for chills.  Eyes: Negative for visual disturbance.  Respiratory: Positive for cough, shortness of breath and wheezing.   Cardiovascular: Negative for chest pain, palpitations and leg swelling.  Gastrointestinal: Negative for nausea, vomiting and abdominal pain.  Endocrine: Negative for polydipsia and polyuria.  Genitourinary: Negative for dysuria, urgency and frequency.  Musculoskeletal: Negative for arthralgias and myalgias.  Skin: Negative for  rash.  Neurological: Positive for headaches. Negative for dizziness, weakness and light-headedness.    Allergies  Lantus; Metoprolol-hydrochlorothiazide; Novolog; Sulfonamide derivatives; Titanium; and Adhesive  Home Medications   Current Outpatient Rx  Name  Route  Sig  Dispense  Refill  . amLODipine (NORVASC) 10 MG tablet   Oral   Take 10 mg by mouth every morning. Once a day         . aspirin 325 MG tablet   Oral   Take 325 mg by mouth 2 (two) times daily.          . beta carotene w/minerals (OCUVITE) tablet   Oral   Take 1 tablet by mouth daily.         . cefUROXime (CEFTIN) 500 MG tablet   Oral   Take 500 mg by mouth 2 (two) times daily with a meal.         . esomeprazole (NEXIUM) 40 MG capsule   Oral   Take 1 capsule (40 mg total) by mouth daily before breakfast.   30 capsule   6   . imipramine (TOFRANIL) 25 MG tablet   Oral   Take 75 mg by mouth at bedtime.          . insulin lispro (HUMALOG) 100 UNIT/ML injection   Subcutaneous   Inject 5 Units into the skin 2 (two) times daily. DO NOT SUBSTITUTE PER PATIENT REQUEST         . levothyroxine (SYNTHROID, LEVOTHROID) 125 MCG tablet   Oral   Take 125 mcg by mouth every morning.          . mometasone (NASONEX) 50 MCG/ACT nasal spray   Nasal   Place 2 sprays into the nose daily. As needed for congestion.         . ticlopidine (TICLID) 250 MG tablet   Oral   Take 1 tablet (250 mg total) by mouth 2 (two) times daily.   60 tablet   5   . triazolam (HALCION) 0.25 MG tablet   Oral   Take 0.25 mg by mouth at bedtime as needed. For sleep         . amoxicillin-clavulanate (AUGMENTIN) 125-31.25 MG/5ML suspension   Oral   Take 125 mg by mouth 3 (three) times daily.         . ferrous sulfate 325 (65 FE) MG tablet   Oral   Take 1 tablet (325 mg total) by mouth 2 (two) times daily with a meal.   60 tablet   1   . predniSONE (DELTASONE) 5 MG tablet  Oral   Take 5 mg by mouth daily.           BP 159/47  Pulse 93  Temp(Src) 99.2 F (37.3 C) (Oral)  Resp 20  SpO2 99% Physical Exam  Nursing note and vitals reviewed. Constitutional: She is oriented to person, place, and time. Vital signs are normal. She appears well-developed and well-nourished. No distress.  HENT:  Head: Normocephalic and atraumatic.  Cardiovascular: Normal rate, regular rhythm and normal heart sounds.  Exam reveals no gallop and no friction rub.   No murmur heard. Pulmonary/Chest: She is in respiratory distress (mild). She has wheezes (Diffuse).  Neurological: She is alert and oriented to person, place, and time. She has normal strength. Coordination normal.  Skin: Skin is warm and dry. No rash noted. She is not diaphoretic.  Psychiatric: She has a normal mood and affect. Judgment normal.    ED Course  Procedures (including critical care time) Labs Review Labs Reviewed - No data to display Imaging Review No results found.    MDM   1. COPD exacerbation    Failure of outpatient treatment. I agree with this patient, she probably needs to be treated in the hospital for this. She is being transferred to the ED via shuttle   Graylon Good, PA-C 06/15/13 1743

## 2013-06-15 NOTE — Progress Notes (Signed)
ANTIBIOTIC CONSULT NOTE - INITIAL  Pharmacy Consult for vancomycin Indication: pneumonia  Allergies  Allergen Reactions  . Lantus [Insulin Glargine]     Patient states "sulfate binder causes sinus infection/pneumonia".  . Metoprolol-Hydrochlorothiazide     REACTION: decreased bp  . Novolog [Insulin Aspart]     Patient states "sulfate binder causes sinus infection/pneumonia". Tolerates Humalog.  . Sulfonamide Derivatives Hives  . Titanium     Cervical plate - had to change for stainless steel  . Adhesive [Tape] Hives and Rash    Patient Measurements: Height: 5' 2.5" (158.8 cm) Weight: 132 lb (59.875 kg) IBW/kg (Calculated) : 51.25  Vital Signs: Temp: 98.8 F (37.1 C) (12/21 1820) Temp src: Oral (12/21 1820) BP: 127/44 mmHg (12/21 2308) Pulse Rate: 75 (12/21 2308)  Labs:  Recent Labs  06/15/13 1840  WBC 11.1*  HGB 12.0  PLT 261  CREATININE 0.70   Estimated Creatinine Clearance: 45.4 ml/min (by C-G formula based on Cr of 0.7).   Microbiology: No results found for this or any previous visit (from the past 720 hour(s)).  Medical History: Past Medical History  Diagnosis Date  . Coronary artery disease   . Dressler's syndrome   . Hypertension   . Dyslipidemia   . Diabetes mellitus   . CVA (cerebral infarction) 1991  . Macular degeneration   . GERD (gastroesophageal reflux disease)   . Diverticulosis   . IBS (irritable bowel syndrome)   . Zenker's diverticulum   . Emphysema   . Pulmonary hypertension   . Chronic sinusitis   . Fibromyalgia   . Hypothyroidism   . Sjogren's disease   . Raynaud's phenomenon   . Cervical disc disease   . Adenomatous colon polyp   . Scleroderma   . COPD (chronic obstructive pulmonary disease)   . Arthritis   . Blood transfusion   . Myocardial infarction may 27th 2007  . Pneumonia      HX PNEUMONIA  . Depression   . 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 Cancer     cervical cancer pre hysterectomy  .  Sleep apnea     STOP BANG SCORE 5  . Legally blind   . CONSTIPATION, CHRONIC 06/26/2007    Qualifier: Diagnosis of  By: Koleen Distance CMA (AAMA), Hulan Saas      Medications:  Prescriptions prior to admission  Medication Sig Dispense Refill  . amLODipine (NORVASC) 10 MG tablet Take 10 mg by mouth every morning. Once a day      . aspirin 325 MG tablet Take 325 mg by mouth 2 (two) times daily.       . beta carotene w/minerals (OCUVITE) tablet Take 1 tablet by mouth daily.      . cefUROXime (CEFTIN) 500 MG tablet Take 500 mg by mouth 2 (two) times daily with a meal. For 10 days      . esomeprazole (NEXIUM) 40 MG capsule Take 1 capsule (40 mg total) by mouth daily before breakfast.  30 capsule  6  . imipramine (TOFRANIL) 25 MG tablet Take 75 mg by mouth at bedtime.       . insulin lispro (HUMALOG) 100 UNIT/ML injection Inject 6 Units into the skin 2 (two) times daily. DO NOT SUBSTITUTE PER PATIENT REQUEST      . levothyroxine (SYNTHROID, LEVOTHROID) 125 MCG tablet Take 125 mcg by mouth every morning.       . predniSONE (DELTASONE) 5 MG tablet Take 5 mg by mouth  daily.      . ticlopidine (TICLID) 250 MG tablet Take 1 tablet (250 mg total) by mouth 2 (two) times daily.  60 tablet  5  . triazolam (HALCION) 0.25 MG tablet Take 0.25 mg by mouth at bedtime as needed. For sleep      . amoxicillin-clavulanate (AUGMENTIN) 125-31.25 MG/5ML suspension Take 125 mg by mouth 3 (three) times daily.      . mometasone (NASONEX) 50 MCG/ACT nasal spray Place 2 sprays into the nose daily. As needed for congestion.       Scheduled:  . aspirin  325 mg Oral BID  . ceFEPime (MAXIPIME) IV  1 g Intravenous Q8H  . enoxaparin (LOVENOX) injection  40 mg Subcutaneous Q24H  . imipramine  75 mg Oral QHS  . [START ON 06/16/2013] levothyroxine  125 mcg Oral q morning - 10a  . methylPREDNISolone (SOLU-MEDROL) injection  60 mg Intravenous Q12H  . [START ON 06/16/2013] pantoprazole  40 mg Oral Daily  . ticlopidine  250 mg Oral BID  .  [START ON 06/16/2013] vancomycin  500 mg Intravenous Q12H  . vancomycin  1,000 mg Intravenous Once   Infusions:  . sodium chloride      Assessment: 79yo female c/o multiple Sx including low-grade fever and fatigue/malaise, PNA on CXR, to begin IV ABX.  Goal of Therapy:  Vancomycin trough level 15-20 mcg/ml  Plan:  Rec'd vanc 1g in ED; will continue with vancomycin 500mg  IV Q12H and monitor CBC, Cx, levels prn.  Vernard Gambles, PharmD, BCPS  06/15/2013,11:51 PM

## 2013-06-15 NOTE — ED Notes (Signed)
79 yr old is here with her daughter with complaints of HA; low grade fever; fatigue/Malaise; arms aching; sinus press; Left ear pain; nasal./cough - yellow; and chest congestion and SOB. She states this has been going on since November, on third round on ABx from PCP with no success. Hx of Bronchitis and Pneumonia. Denies: Chest Pain

## 2013-06-15 NOTE — ED Provider Notes (Signed)
Medical screening examination/treatment/procedure(s) were conducted as a shared visit with non-physician practitioner(s) and myself.  I personally evaluated the patient during the encounter.  EKG Interpretation    Date/Time:  Sunday June 15 2013 18:23:35 EST Ventricular Rate:  91 PR Interval:  148 QRS Duration: 68 QT Interval:  322 QTC Calculation: 396 R Axis:   69 Text Interpretation:  Normal sinus rhythm ST \\T \ T wave abnormality, consider inferior ischemia ST \\T \ T wave abnormality, consider anterolateral ischemia Similar to prior Confirmed by Gwendolyn Grant  MD, Lovada Barwick ([id number]) on 06/15/2013 6:37:12 PM             Patient here with SOB, productive cough. Has been on multiple courses of antibiotics for bronchitis. Sent here by Urgent Care. Patient here with no acute respiratory distress, no retractions. CXR shows pneumonia. Patient admitted for pneumonia. Broad spectrum antibiotics given since she's failed multiple outpatient courses of antibiotics.  Dagmar Hait, MD 06/15/13 [phone]

## 2013-06-15 NOTE — ED Notes (Signed)
EKG completed at 1823 in triage.

## 2013-06-15 NOTE — ED Notes (Signed)
Antibiotic paused to obtain blood cultures.

## 2013-06-15 NOTE — ED Notes (Signed)
MD made aware that ED is unable to obtain 2nd round of blood cultures. Antibiotic resumed. Floor informed.

## 2013-06-16 DIAGNOSIS — E039 Hypothyroidism, unspecified: Secondary | ICD-10-CM

## 2013-06-16 LAB — CBC WITH DIFFERENTIAL/PLATELET
Basophils Absolute: 0 10*3/uL (ref 0.0–0.1)
Basophils Relative: 0 % (ref 0–1)
Eosinophils Absolute: 0 10*3/uL (ref 0.0–0.7)
Eosinophils Relative: 0 % (ref 0–5)
HCT: 34.1 % — ABNORMAL LOW (ref 36.0–46.0)
Hemoglobin: 11.1 g/dL — ABNORMAL LOW (ref 12.0–15.0)
Lymphocytes Relative: 9 % — ABNORMAL LOW (ref 12–46)
Lymphs Abs: 0.8 10*3/uL (ref 0.7–4.0)
MCH: 27.3 pg (ref 26.0–34.0)
MCHC: 32.6 g/dL (ref 30.0–36.0)
MCV: 84 fL (ref 78.0–100.0)
Monocytes Absolute: 0.1 10*3/uL (ref 0.1–1.0)
Monocytes Relative: 2 % — ABNORMAL LOW (ref 3–12)
Neutro Abs: 7.8 10*3/uL — ABNORMAL HIGH (ref 1.7–7.7)
Neutrophils Relative %: 89 % — ABNORMAL HIGH (ref 43–77)
Platelets: 237 10*3/uL (ref 150–400)
RBC: 4.06 MIL/uL (ref 3.87–5.11)
RDW: 13.4 % (ref 11.5–15.5)
WBC: 8.7 10*3/uL (ref 4.0–10.5)

## 2013-06-16 LAB — COMPREHENSIVE METABOLIC PANEL
ALT: 14 U/L (ref 0–35)
AST: 13 U/L (ref 0–37)
Albumin: 3.3 g/dL — ABNORMAL LOW (ref 3.5–5.2)
Alkaline Phosphatase: 70 U/L (ref 39–117)
BUN: 10 mg/dL (ref 6–23)
CO2: 25 mEq/L (ref 19–32)
Calcium: 8.6 mg/dL (ref 8.4–10.5)
Chloride: 98 mEq/L (ref 96–112)
Creatinine, Ser: 0.64 mg/dL (ref 0.50–1.10)
GFR calc Af Amer: >90 mL/min (ref >90)
GFR calc non Af Amer: 82 mL/min — ABNORMAL LOW (ref >90)
Glucose, Bld: 174 mg/dL — ABNORMAL HIGH (ref 70–99)
Potassium: 4.1 mEq/L (ref 3.5–5.1)
Sodium: 134 mEq/L — ABNORMAL LOW (ref 135–145)
Total Bilirubin: 0.2 mg/dL — ABNORMAL LOW (ref 0.3–1.2)
Total Protein: 6.7 g/dL (ref 6.0–8.3)

## 2013-06-16 LAB — STREP PNEUMONIAE URINARY ANTIGEN: Strep Pneumo Urinary Antigen: NEGATIVE

## 2013-06-16 LAB — GLUCOSE, CAPILLARY
Glucose-Capillary: 143 mg/dL — ABNORMAL HIGH (ref 70–99)
Glucose-Capillary: 163 mg/dL — ABNORMAL HIGH (ref 70–99)
Glucose-Capillary: 223 mg/dL — ABNORMAL HIGH (ref 70–99)
Glucose-Capillary: 251 mg/dL — ABNORMAL HIGH (ref 70–99)
Glucose-Capillary: 298 mg/dL — ABNORMAL HIGH (ref 70–99)

## 2013-06-16 LAB — LEGIONELLA ANTIGEN, URINE: Legionella Antigen, Urine: NEGATIVE

## 2013-06-16 LAB — INFLUENZA PANEL BY PCR (TYPE A & B)
H1N1 flu by pcr: NOT DETECTED
Influenza A By PCR: NEGATIVE
Influenza B By PCR: NEGATIVE

## 2013-06-16 MED ORDER — INSULIN LISPRO 100 UNIT/ML (KWIKPEN)
0.0000 [IU] | PEN_INJECTOR | Freq: Three times a day (TID) | SUBCUTANEOUS | Status: DC
  Administered 2013-06-17: 3 [IU] via SUBCUTANEOUS
  Administered 2013-06-17: 2 [IU] via SUBCUTANEOUS
  Filled 2013-06-16: qty 3

## 2013-06-16 MED ORDER — INSULIN LISPRO 100 UNIT/ML CARTRIDGE: 0.0000 [IU] | Freq: Every day | SUBCUTANEOUS | Status: DC

## 2013-06-16 MED ORDER — PIPERACILLIN-TAZOBACTAM 3.375 G IVPB
3.3750 g | Freq: Three times a day (TID) | INTRAVENOUS | Status: DC
  Administered 2013-06-16 – 2013-06-17 (×5): 3.375 g via INTRAVENOUS
  Filled 2013-06-16 (×9): qty 50

## 2013-06-16 MED ORDER — INSULIN LISPRO 100 UNIT/ML (KWIKPEN)
0.0000 [IU] | PEN_INJECTOR | Freq: Every day | SUBCUTANEOUS | Status: DC
  Filled 2013-06-16: qty 3

## 2013-06-16 MED ORDER — INSULIN LISPRO 100 UNIT/ML CARTRIDGE
0.0000 [IU] | Freq: Three times a day (TID) | SUBCUTANEOUS | Status: DC
  Administered 2013-06-16: 8 [IU] via SUBCUTANEOUS

## 2013-06-16 MED ORDER — GLUCERNA SHAKE PO LIQD
237.0000 mL | Freq: Every day | ORAL | Status: DC
  Administered 2013-06-16 – 2013-06-18 (×3): 237 mL via ORAL

## 2013-06-16 NOTE — Progress Notes (Signed)
Pt humalog checked out from pharmacy. Pharmacy called to request barcode for scanning. Will continue to monitor.

## 2013-06-16 NOTE — Progress Notes (Signed)
Utilization review completed.  

## 2013-06-16 NOTE — Evaluation (Signed)
Clinical/Bedside Swallow Evaluation Patient Details  Name: DALAYAH DEAHL MRN: [id number] Date of Birth: 1930/07/15  Today's Date: 06/16/2013 Time: 1100-1140 SLP Time Calculation (min): 40 min  Past Medical History:  Past Medical History  Diagnosis Date  . Coronary artery disease   . Dressler's syndrome   . Hypertension   . Dyslipidemia   . Diabetes mellitus   . CVA (cerebral infarction) 1991  . Macular degeneration   . GERD (gastroesophageal reflux disease)   . Diverticulosis   . IBS (irritable bowel syndrome)   . Zenker's diverticulum   . Emphysema   . Pulmonary hypertension   . Chronic sinusitis   . Fibromyalgia   . Hypothyroidism   . Sjogren's disease   . Raynaud's phenomenon   . Cervical disc disease   . Adenomatous colon polyp   . Scleroderma   . COPD (chronic obstructive pulmonary disease)   . Arthritis   . Blood transfusion   . Myocardial infarction may 27th 2007  . Pneumonia      HX PNEUMONIA  . Depression   . 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 Cancer     cervical cancer pre hysterectomy  . Sleep apnea     STOP BANG SCORE 5  . Legally blind   . CONSTIPATION, CHRONIC 06/26/2007    Qualifier: Diagnosis of  By: Koleen Distance CMA Duncan Dull), Hulan Saas     Past Surgical History:  Past Surgical History  Procedure Laterality Date  . Total abdominal hysterectomy  1973  . Bilateral oophorectomy  2002  . Spinal fusion  1997    and implantation of a plate   . Lobe thyroid removal    . Thyroid lobectomy  1991    removal of left lobe thyroid  . Cholecystectomy  1965  . Appendectomy    . Cataract extraction    . Bronchoscopy  02-22-09  . Stented cardiac  artery  2007 / 2007 / 2010    X 3 STENT  . Proctoscopy  03/18/2012    Procedure: PROCTOSCOPY;  Surgeon: [name] Sportsman, MD;  Location: [facility];  Service: General;  Laterality: N/A;  rigid procto  . Colon surgery  2014    colon resection   HPI:  The patient presents with complaints of cough  low-grade fever shortness of breath and headache which has been ongoing since Thanksgiving.   Assessment / Plan / Recommendation Clinical Impression  Pt. appears to have normal oropharyngeal swallow function at bedside, but does have a known esophageal dysphagia s/p esophageal tear following ACDF in 1997, and presence of Zenker's diverticulum and esophageal dysmotility.  Currently, pt. describes choking episodes on food, especially dry, crumbly foods, and states that pills sometimes stick in the Zenker's.  Pt. reports she does relatively well when she eats slowly and concentrates on swallowing.  Pt. may have aspiration of reflux, as she also has a h/o GERD.  A regular barium swallow (esophagram) may provide greater information than a MBS.      Aspiration Risk  Mild    Diet Recommendation Dysphagia 3 (Mechanical Soft);Thin liquid   Liquid Administration via: Straw Medication Administration: Crushed with puree (Consider liquid meds) Supervision: Patient able to self feed Compensations: Slow rate;Small sips/bites;Follow solids with liquid Postural Changes and/or Swallow Maneuvers: Out of bed for meals;Seated upright 90 degrees;Upright 30-60 min after meal    Other  Recommendations Recommended Consults: Consider esophageal assessment;Consider GI evaluation Oral Care Recommendations: Oral care BID;Patient independent with oral  care Other Recommendations: Clarify dietary restrictions   Follow Up Recommendations  None    Frequency and Duration min 1 x/week  1 week   Pertinent Vitals/Pain LS Diminished, but clear; afebrile    SLP Swallow Goals  see care plan    Swallow Study Prior Functional Status       General HPI: The patient presents with complaints of cough low-grade fever shortness of breath and headache which has been ongoing since Thanksgiving. Previous Swallow Assessment: Esophagram 12/20/01:   THERE IS A 5 X 15 MM ZENKER'S TYPE DIVERTICULUM AT C5-6.  THERE IS NO NARROWING OF  THE CERVICAL.  THE SWALLOWING FUNCTION IS NORMAL.  THE PATIENT HAS PREVIOUSLY BEEN DEMONSTRATED TO Temperature Spikes Noted:  (low grade) Respiratory Status: Room air History of Recent Intubation: No Behavior/Cognition: Alert;Cooperative;Pleasant mood Oral Cavity - Dentition: Adequate natural dentition Baseline Vocal Quality: Clear Volitional Cough: Strong Volitional Swallow: Able to elicit    Oral/Motor/Sensory Function Overall Oral Motor/Sensory Function: Appears within functional limits for tasks assessed   Ice Chips     Thin Liquid Thin Liquid: Within functional limits Presentation: Straw    Nectar Thick Nectar Thick Liquid: Not tested Presentation: Cup   Honey Thick Honey Thick Liquid: Not tested   Puree Puree: Within functional limits   Solid   GO    Solid: Within functional limits Presentation: Self Daine Gravel, Cornelia Walraven T 06/16/2013,12:50 PM

## 2013-06-16 NOTE — Progress Notes (Signed)
[facility] PROGRESS NOTE  ALDINA PORTA ZOX:[id number] DOB: [birth year] DOA: 06/15/2013  PCP: Georgianne Fick, MD  Brief HPI: [name] is a 79 y.o. female with past medical history of coronary artery disease, scleroderma, crest syndrome, and strength is diverticulum, GERD, diabetes mellitus, hypertension, CVA, hypothyroidism, cryptogenic organizing pneumonia. The patient presented with complaints of cough low-grade fever shortness of breath and headache which has been ongoing since Thanksgiving.  She has history of surgery on her esophagus and has a fistula due to which she has once a month episodes of aspiration. Around Thanksgiving she had an episode of aspiration. She was placed on Ceftin and since she continued to have the similar symptoms change the antibiotic to Augmentin and then Ceftin again.   Past medical history:  Past Medical History  Diagnosis Date  . Coronary artery disease   . Dressler's syndrome   . Hypertension   . Dyslipidemia   . Diabetes mellitus   . CVA (cerebral infarction) 1991  . Macular degeneration   . GERD (gastroesophageal reflux disease)   . Diverticulosis   . IBS (irritable bowel syndrome)   . Zenker's diverticulum   . Emphysema   . Pulmonary hypertension   . Chronic sinusitis   . Fibromyalgia   . Hypothyroidism   . Sjogren's disease   . Raynaud's phenomenon   . Cervical disc disease   . Adenomatous colon polyp   . Scleroderma   . COPD (chronic obstructive pulmonary disease)   . Arthritis   . Blood transfusion   . Myocardial infarction may 27th 2007  . Pneumonia      HX PNEUMONIA  . Depression   . 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 Cancer     cervical cancer pre hysterectomy  . Sleep apnea     STOP BANG SCORE 5  . Legally blind   . CONSTIPATION, CHRONIC 06/26/2007    Qualifier: Diagnosis of  By: Koleen Distance CMA Duncan Dull), Hulan Saas      Consultants: None  Procedures: Swallow  Evaluation  Antibiotics: Vanc 12/21--> Cefepine 12/21-12/22 Zosyn 12/22-->  Subjective: Patient feels better today compared to yesterday. Cough and breathing have improved. Has had some difficulty with swallowing which is worse than her baseline.  Objective: Vital Signs  Filed Vitals:   06/15/13 2115 06/15/13 2222 06/15/13 2308 06/16/13 0500  BP: 129/50 116/49 127/44 132/58  Pulse: 85 81 75 85  Temp:   98 F (36.7 C) 97.4 F (36.3 C)  TempSrc:   Oral Oral  Resp: 20 13 16 20   Height:   5\' 2"  (1.575 m)   Weight:   59.8 kg (131 lb 13.4 oz)   SpO2: 96% 96% 98% 94%    Intake/Output Summary (Last 24 hours) at 06/16/13 1007 Last data filed at 06/16/13 0700  Gross per 24 hour  Intake 711.67 ml  Output   1300 ml  Net -588.33 ml   Filed Weights   06/15/13 1820 06/15/13 2308  Weight: 59.875 kg (132 lb) 59.8 kg (131 lb 13.4 oz)    General appearance: alert, cooperative, appears stated age and no distress Resp: coarse breath sounds bilaterally. No definite crackles. no wheezing. Cardio: regular rate and rhythm, S1, S2 normal, no murmur, click, rub or gallop GI: soft, non-tender; bowel sounds normal; no masses,  no organomegaly Extremities: extremities normal, atraumatic, no cyanosis or edema Neurologic: Alert and oriented X 3, normal strength and tone. No focal deficits  Lab Results:  Basic Metabolic  Panel:  Recent Labs Lab 06/15/13 1840 06/16/13 0606  NA 133* 134*  K 3.2* 4.1  CL 94* 98  CO2 28 25  GLUCOSE 119* 174*  BUN 10 10  CREATININE 0.70 0.64  CALCIUM 9.0 8.6   Liver Function Tests:  Recent Labs Lab 06/16/13 0606  AST 13  ALT 14  ALKPHOS 70  BILITOT 0.2*  PROT 6.7  ALBUMIN 3.3*   CBC:  Recent Labs Lab 06/15/13 1840 06/16/13 0606  WBC 11.1* 8.7  NEUTROABS  --  7.8*  HGB 12.0 11.1*  HCT 36.0 34.1*  MCV 84.7 84.0  PLT 261 237   BNP (last 3 results)  Recent Labs  06/15/13 1840  PROBNP 190.2   CBG:  Recent Labs Lab 06/15/13 2348  06/16/13 0743  GLUCAP 298* 163*    No results found for this or any previous visit (from the past 240 hour(s)).    Studies/Results: Dg Chest 2 View  06/15/2013   CLINICAL DATA:  Weakness with short of breath and headaches.  EXAM: CHEST  2 VIEW  COMPARISON:  03/19/2012.  FINDINGS: Patchy airspace disease is present in the right upper lobe compatible with pneumonia. Chronic interstitial prominence. The cardiopericardial silhouette is within normal limits. Surgical clips at the thoracic inlet. Mediastinal contours are within normal limits, unchanged. Aortic arch atherosclerosis. Emphysema is present. Blunting of both costophrenic angles. Coronary artery stent noted.  IMPRESSION: Right upper lobe pneumonia. Followup to ensure radiographic clearing and exclude an underlying lesion is recommended. Typically clearing will be observed at 8 weeks. Underlying emphysema.   Electronically Signed   By: Andreas Newport M.D.   On: 06/15/2013 20:01    Medications:  Scheduled: . aspirin  325 mg Oral BID  . enoxaparin (LOVENOX) injection  40 mg Subcutaneous Q24H  . imipramine  75 mg Oral QHS  . levothyroxine  125 mcg Oral QAC breakfast  . methylPREDNISolone (SOLU-MEDROL) injection  60 mg Intravenous Q12H  . pantoprazole  40 mg Oral Daily  . ticlopidine  250 mg Oral BID  . vancomycin  500 mg Intravenous Q12H   Continuous:  ZOX:[id number], zolpidem  Assessment/Plan:  Principal Problem:   CAP (community acquired pneumonia) Active Problems:   HYPERLIPIDEMIA   HYPERTENSION   CORONARY ARTERY DISEASE   BOOP (bronchiolitis obliterans with organizing pneumonia)   ZENKER'S DIVERTICULUM   Systemic sclerosis   Diabetes mellitus    Recurrent CAP (community acquired pneumonia) versus Aspiration Pneumonia Since patient was on multiple different course of antibiotics as OP she was placed on broad spectrum coverage. Swallow evaluation is pending due to possibility of aspiration. Will change to Zosyn from  cefepime. She feels better today. Considering her history of cryptogenic organizing pneumonia it is also possible that her current presentation is due to the same. Continue IV Solu-Medrol 60 mg every 12 hours.  History of Coronary artery disease  Appears to be stable. Continue her on aspirin  History of Diabetes mellitus Type 2 Continue SSI. Patient's family to bring in Lispro insulin as she doesn't tolerate Aspart.   Hypothyroidism  Continue Synthroid   History of GERD  Protonix   On Chronic Steroids Presumably for lung disease. Now on Solumedrol  Code Status: Full Code  DVT Prophylaxis: Lovenox    Family Communication: Discussed with patient  Disposition Plan: Home when better. Mobilize.    LOS: 1 day   [facility]  [facility] Pager [phone] 06/16/2013, 10:07 AM  If 8PM-8AM, please contact night-coverage at [email or web address], password [facility]

## 2013-06-16 NOTE — Progress Notes (Signed)
INITIAL NUTRITION ASSESSMENT  DOCUMENTATION CODES Per approved criteria  -Not Applicable   INTERVENTION:  1. Glucerna Shake po daily, each supplement provides 220 kcal and 10 grams of protein  NUTRITION DIAGNOSIS: Inadequate oral intake related to acute illness as evidenced by pt report.   Goal: Pt to meet >/= 90% of their estimated nutrition needs   Monitor:  PO intake, weight trend, labs   Reason for Assessment: Pt identified as at nutrition risk on the Malnutrition Screen Tool  79 y.o. female  Admitting Dx: CAP (community acquired pneumonia)  ASSESSMENT: Pt admitted with SOB. Pt had been dx with CAP as outpatient close to Thanksgiving and has not felt well since.  Pt reports a 20 lb weight loss after her colon surgery last year, she has since regained some of this. Pt normally has a good appetite but it has been decreased due to not feeling well recently. Pt lives alone. She has "a lot of food in my house" but sometimes has not ate much due to not feeling well enough to get up and prepare or heat something up. Intake has improved since admission (55% of Breakfast and 100% of lunch). Per pt she feels the best she has in a month.  Encouraged pt to consume supplement when/if appetite is decreased and that this was a good option for home when she is not feeling well.   Nutrition Focused Physical Exam:  Subcutaneous Fat:  Orbital Region: WNL Upper Arm Region: WNL Thoracic and Lumbar Region: WNL  Muscle:  Temple Region: WNL Clavicle Bone Region: WNL Clavicle and Acromion Bone Region: WNL Scapular Bone Region: WNL Dorsal Hand: WNL Patellar Region: WNL Anterior Thigh Region: WNL Posterior Calf Region: WNL  Edema: not present   Height: Ht Readings from Last 1 Encounters:  06/15/13 5\' 2"  (1.575 m)    Weight: Wt Readings from Last 1 Encounters:  06/15/13 131 lb 13.4 oz (59.8 kg)    Ideal Body Weight: 50 kg   % Ideal Body Weight: 120%  Wt Readings from Last 10  Encounters:  06/15/13 131 lb 13.4 oz (59.8 kg)  05/15/13 131 lb (59.421 kg)  09/16/12 130 lb (58.968 kg)  05/06/12 127 lb 3.2 oz (57.698 kg)  04/22/12 129 lb 6.4 oz (58.695 kg)  04/09/12 129 lb (58.514 kg)  04/04/12 129 lb 4 oz (58.627 kg)  04/02/12 131 lb 6.4 oz (59.603 kg)  03/17/12 133 lb 2.5 oz (60.4 kg)  03/17/12 133 lb 2.5 oz (60.4 kg)    Usual Body Weight: 129-133 lb   % Usual Body Weight: within range  BMI:  Body mass index is 24.11 kg/(m^2).  Estimated Nutritional Needs: Kcal: 1450-1600 Protein: 70-80 grams Fluid: > 1.5 L/day  Skin: no issues noted  Diet Order: Carb Control Meal Completion: 100%  EDUCATION NEEDS: -No education needs identified at this time   Intake/Output Summary (Last 24 hours) at 06/16/13 1118 Last data filed at 06/16/13 0900  Gross per 24 hour  Intake 951.67 ml  Output   1850 ml  Net -898.33 ml    Last BM: PTA   Labs:   Recent Labs Lab 06/15/13 1840 06/16/13 0606  NA 133* 134*  K 3.2* 4.1  CL 94* 98  CO2 28 25  BUN 10 10  CREATININE 0.70 0.64  CALCIUM 9.0 8.6  GLUCOSE 119* 174*    CBG (last 3)   Recent Labs  06/15/13 2348 06/16/13 0743  GLUCAP 298* 163*    Scheduled Meds: . aspirin  325 mg Oral BID  . enoxaparin (LOVENOX) injection  40 mg Subcutaneous Q24H  . imipramine  75 mg Oral QHS  . levothyroxine  125 mcg Oral QAC breakfast  . methylPREDNISolone (SOLU-MEDROL) injection  60 mg Intravenous Q12H  . pantoprazole  40 mg Oral Daily  . piperacillin-tazobactam (ZOSYN)  IV  3.375 g Intravenous Q8H  . ticlopidine  250 mg Oral BID  . vancomycin  500 mg Intravenous Q12H    Continuous Infusions:   Past Medical History  Diagnosis Date  . Coronary artery disease   . Dressler's syndrome   . Hypertension   . Dyslipidemia   . Diabetes mellitus   . CVA (cerebral infarction) 1991  . Macular degeneration   . GERD (gastroesophageal reflux disease)   . Diverticulosis   . IBS (irritable bowel syndrome)   .  Zenker's diverticulum   . Emphysema   . Pulmonary hypertension   . Chronic sinusitis   . Fibromyalgia   . Hypothyroidism   . Sjogren's disease   . Raynaud's phenomenon   . Cervical disc disease   . Adenomatous colon polyp   . Scleroderma   . COPD (chronic obstructive pulmonary disease)   . Arthritis   . Blood transfusion   . Myocardial infarction may 27th 2007  . Pneumonia      HX PNEUMONIA  . Depression   . 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 Cancer     cervical cancer pre hysterectomy  . Sleep apnea     STOP BANG SCORE 5  . Legally blind   . CONSTIPATION, CHRONIC 06/26/2007    Qualifier: Diagnosis of  By: Koleen Distance CMA Duncan Dull), Hulan Saas      Past Surgical History  Procedure Laterality Date  . Total abdominal hysterectomy  1973  . Bilateral oophorectomy  2002  . Spinal fusion  1997    and implantation of a plate   . Lobe thyroid removal    . Thyroid lobectomy  1991    removal of left lobe thyroid  . Cholecystectomy  1965  . Appendectomy    . Cataract extraction    . Bronchoscopy  02-22-09  . Stented cardiac  artery  2007 / 2007 / 2010    X 3 STENT  . Proctoscopy  03/18/2012    Procedure: PROCTOSCOPY;  Surgeon: [name] Sportsman, MD;  Location: [facility];  Service: General;  Laterality: N/A;  rigid procto  . Colon surgery  2014    colon resection    Kendell Bane RD, LDN, CNSC [phone] Pager [phone] After Hours Pager

## 2013-06-16 NOTE — Progress Notes (Signed)
ANTIBIOTIC CONSULT NOTE - INITIAL  Pharmacy Consult for Zosyn Indication: pneumonia  Allergies  Allergen Reactions  . Lantus [Insulin Glargine]     Patient states "sulfate binder causes sinus infection/pneumonia".  . Metoprolol-Hydrochlorothiazide     REACTION: decreased bp  . Novolog [Insulin Aspart]     Patient states "sulfate binder causes sinus infection/pneumonia". Tolerates Humalog.  . Sulfonamide Derivatives Hives  . Titanium     Cervical plate - had to change for stainless steel  . Adhesive [Tape] Hives and Rash    Patient Measurements: Height: 5\' 2"  (157.5 cm) Weight: 131 lb 13.4 oz (59.8 kg) IBW/kg (Calculated) : 50.1  Vital Signs: Temp: 98.6 F (37 C) (12/22 1000) Temp src: Oral (12/22 1000) BP: 132/53 mmHg (12/22 1000) Pulse Rate: 81 (12/22 1000) Intake/Output from previous day: 12/21 0701 - 12/22 0700 In: 711.7 [I.V.:711.7] Out: 1300 [Urine:1300] Intake/Output from this shift: Total I/O In: 240 [P.O.:240] Out: 550 [Urine:550]  Labs:  Recent Labs  06/15/13 1840 06/16/13 0606  WBC 11.1* 8.7  HGB 12.0 11.1*  PLT 261 237  CREATININE 0.70 0.64   Estimated Creatinine Clearance: 44.4 ml/min (by C-G formula based on Cr of 0.64). No results found for this basename: VANCOTROUGH, VANCOPEAK, VANCORANDOM, GENTTROUGH, GENTPEAK, GENTRANDOM, TOBRATROUGH, TOBRAPEAK, TOBRARND, AMIKACINPEAK, AMIKACINTROU, AMIKACIN,  in the last 72 hours   Microbiology: No results found for this or any previous visit (from the past 720 hour(s)).  Medical History: Past Medical History  Diagnosis Date  . Coronary artery disease   . Dressler's syndrome   . Hypertension   . Dyslipidemia   . Diabetes mellitus   . CVA (cerebral infarction) 1991  . Macular degeneration   . GERD (gastroesophageal reflux disease)   . Diverticulosis   . IBS (irritable bowel syndrome)   . Zenker's diverticulum   . Emphysema   . Pulmonary hypertension   . Chronic sinusitis   . Fibromyalgia   .  Hypothyroidism   . Sjogren's disease   . Raynaud's phenomenon   . Cervical disc disease   . Adenomatous colon polyp   . Scleroderma   . COPD (chronic obstructive pulmonary disease)   . Arthritis   . Blood transfusion   . Myocardial infarction may 27th 2007  . Pneumonia      HX PNEUMONIA  . Depression   . 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 Cancer     cervical cancer pre hysterectomy  . Sleep apnea     STOP BANG SCORE 5  . Legally blind   . CONSTIPATION, CHRONIC 06/26/2007    Qualifier: Diagnosis of  By: Koleen Distance CMA (AAMA), Hulan Saas      Medications:  Prescriptions prior to admission  Medication Sig Dispense Refill  . amLODipine (NORVASC) 10 MG tablet Take 10 mg by mouth every morning. Once a day      . aspirin 325 MG tablet Take 325 mg by mouth 2 (two) times daily.       . beta carotene w/minerals (OCUVITE) tablet Take 1 tablet by mouth daily.      . cefUROXime (CEFTIN) 500 MG tablet Take 500 mg by mouth 2 (two) times daily with a meal. For 10 days      . esomeprazole (NEXIUM) 40 MG capsule Take 1 capsule (40 mg total) by mouth daily before breakfast.  30 capsule  6  . imipramine (TOFRANIL) 25 MG tablet Take 75 mg by mouth at bedtime.       [name]  insulin lispro (HUMALOG) 100 UNIT/ML injection Inject 6 Units into the skin 2 (two) times daily. DO NOT SUBSTITUTE PER PATIENT REQUEST      . levothyroxine (SYNTHROID, LEVOTHROID) 125 MCG tablet Take 125 mcg by mouth every morning.       . predniSONE (DELTASONE) 5 MG tablet Take 5 mg by mouth daily.      . ticlopidine (TICLID) 250 MG tablet Take 1 tablet (250 mg total) by mouth 2 (two) times daily.  60 tablet  5  . triazolam (HALCION) 0.25 MG tablet Take 0.25 mg by mouth at bedtime as needed. For sleep      . amoxicillin-clavulanate (AUGMENTIN) 125-31.25 MG/5ML suspension Take 125 mg by mouth 3 (three) times daily.      . mometasone (NASONEX) 50 MCG/ACT nasal spray Place 2 sprays into the nose daily. As needed for  congestion.       Assessment: 79yo female c/o multiple sx including low-grade fever and fatigue/malaise, PNA on CXR. Currently day #2 IV abx to change to Zosyn from cefepime. Remains afeb, WBC down (11.1>8.7).  12/21 Vanc>> 12/22 Cefepime x1 12/22 Zosyn>>  12/21 BCx2>> 12/21 sputum cx>>  Goal of Therapy:  Vancomycin trough level 15-20 mcg/ml  Plan:  - Start Zosyn 3.375 gm IV q8h (4 hr infusion) - Continue Vanc 500 mg IV q12h - Monitor renal function, CBC, C&S, levels prn  Margie Billet, PharmD Clinical Pharmacist - Resident Pager: [phone] Pharmacy: [phone] 06/16/2013 10:58 AM

## 2013-06-16 NOTE — Progress Notes (Signed)
Patients daughter brought pt kwikpen/humalog. Humalog taken to 5th floor pharmacy, spoke with MD requesting resumption of insulin orders, requested I speak with pharmacy directly. Spoke with Homero Fellers in main pharmacy to resume insulin regimen. Will continue to monitor.

## 2013-06-16 NOTE — ED Provider Notes (Signed)
Medical screening examination/treatment/procedure(s) were performed by a resident physician or non-physician practitioner and as the supervising physician I was immediately available for consultation/collaboration.  Anae Hams, MD    Dorlisa Savino S Marlon Vonruden, MD 06/16/13 0743 

## 2013-06-16 NOTE — Progress Notes (Signed)
SLP Cancellation Note  Patient Details Name: [name] MRN: [id number] DOB: 05-19-1931   Cancelled treatment:        Attempted to see pt. For Swallow evaluation.  Pt. Currently with MD.  SLP will return   Maryjo Rochester T 06/16/2013, 9:56 AM

## 2013-06-17 ENCOUNTER — Inpatient Hospital Stay (HOSPITAL_COMMUNITY): Payer: Medicare Other

## 2013-06-17 DIAGNOSIS — R1319 Other dysphagia: Secondary | ICD-10-CM

## 2013-06-17 LAB — CBC
HCT: 31.8 % — ABNORMAL LOW (ref 36.0–46.0)
Hemoglobin: 10.3 g/dL — ABNORMAL LOW (ref 12.0–15.0)
MCH: 27.2 pg (ref 26.0–34.0)
MCHC: 32.4 g/dL (ref 30.0–36.0)
MCV: 84.1 fL (ref 78.0–100.0)
Platelets: 288 10*3/uL (ref 150–400)
RBC: 3.78 MIL/uL — ABNORMAL LOW (ref 3.87–5.11)
RDW: 13.4 % (ref 11.5–15.5)
WBC: 13.6 10*3/uL — ABNORMAL HIGH (ref 4.0–10.5)

## 2013-06-17 LAB — GLUCOSE, CAPILLARY
Glucose-Capillary: 183 mg/dL — ABNORMAL HIGH (ref 70–99)
Glucose-Capillary: 121 mg/dL — ABNORMAL HIGH (ref 70–99)
Glucose-Capillary: 279 mg/dL — ABNORMAL HIGH (ref 70–99)
Glucose-Capillary: 144 mg/dL — ABNORMAL HIGH (ref 70–99)

## 2013-06-17 LAB — COMPREHENSIVE METABOLIC PANEL
ALT: 29 U/L (ref 0–35)
AST: 27 U/L (ref 0–37)
Albumin: 3 g/dL — ABNORMAL LOW (ref 3.5–5.2)
Alkaline Phosphatase: 72 U/L (ref 39–117)
BUN: 16 mg/dL (ref 6–23)
CO2: 25 mEq/L (ref 19–32)
Calcium: 8.5 mg/dL (ref 8.4–10.5)
Chloride: 100 mEq/L (ref 96–112)
Creatinine, Ser: 0.72 mg/dL (ref 0.50–1.10)
GFR calc Af Amer: >90 mL/min (ref >90)
GFR calc non Af Amer: 79 mL/min — ABNORMAL LOW (ref >90)
Glucose, Bld: 231 mg/dL — ABNORMAL HIGH (ref 70–99)
Potassium: 3.9 mEq/L (ref 3.5–5.1)
Sodium: 136 mEq/L (ref 135–145)
Total Bilirubin: 0.1 mg/dL — ABNORMAL LOW (ref 0.3–1.2)
Total Protein: 6.4 g/dL (ref 6.0–8.3)

## 2013-06-17 MED ORDER — INSULIN LISPRO 100 UNIT/ML (KWIKPEN): 0.0000 [IU] | PEN_INJECTOR | Freq: Every day | SUBCUTANEOUS | Status: DC

## 2013-06-17 MED ORDER — SENNA 8.6 MG PO TABS
1.0000 | ORAL_TABLET | Freq: Every day | ORAL | Status: DC | PRN
Start: 2013-06-17 — End: 2013-06-19
  Filled 2013-06-17: qty 1

## 2013-06-17 MED ORDER — METHYLPREDNISOLONE SODIUM SUCC 125 MG IJ SOLR
60.0000 mg | Freq: Every day | INTRAMUSCULAR | Status: DC
  Administered 2013-06-17: 60 mg via INTRAVENOUS
  Filled 2013-06-17 (×2): qty 0.96

## 2013-06-17 MED ORDER — INSULIN LISPRO 100 UNIT/ML (KWIKPEN)
0.0000 [IU] | PEN_INJECTOR | Freq: Three times a day (TID) | SUBCUTANEOUS | Status: DC
  Administered 2013-06-17: 8 [IU] via SUBCUTANEOUS
  Administered 2013-06-18: 3 [IU] via SUBCUTANEOUS
  Administered 2013-06-19: 2 [IU] via SUBCUTANEOUS

## 2013-06-17 NOTE — Progress Notes (Signed)
Speech Language Pathology Treatment: Dysphagia  Patient Details Name: [name] MRN: [id number] DOB: [birth year] Today's Date: 06/17/2013 Time: 7829-5621 SLP Time Calculation (min): 26 min  Assessment / Plan / Recommendation Clinical Impression  Esophagram completed this am; report pending.  Pt. States the 13mm barium tablet did not pass into the stomach.  Pt further reports that her GI MD was previous unable to dilate this area previously due to her scleroderma.  Reviewed diet recommendations for Dysphagia 3, aspiration, and reflux precautions with patient.  Pt states she did much better with Dys 3 rather than regular foods, stating, "That was the easiest meal I've had in a long time."   HPI HPI: The patient presents with complaints of cough low-grade fever shortness of breath and headache which has been ongoing since Thanksgiving.   Pertinent Vitals Afebrile; BS diminished.  SLP Plan  Continue with current plan of care (Awaiting Esophagram results)    Recommendations Diet recommendations: Dysphagia 3 (mechanical soft);Thin liquid Liquids provided via: Cup;Straw Medication Administration: Crushed with puree Supervision: Patient able to self feed Compensations: Slow rate;Small sips/bites;Follow solids with liquid Postural Changes and/or Swallow Maneuvers: Out of bed for meals;Seated upright 90 degrees;Upright 30-60 min after meal              Oral Care Recommendations: Oral care BID;Patient independent with oral care Follow up Recommendations: None Plan: Continue with current plan of care (Awaiting Esophagram results)    GO     Maryjo Rochester T 06/17/2013, 12:07 PM

## 2013-06-17 NOTE — Progress Notes (Addendum)
[facility] PROGRESS NOTE  [name] ZOX:[id number] DOB: 10-03-1930 DOA: 06/15/2013  PCP: Renee Fick, MD  Brief HPI: [name] is a 79 y.o. female with past medical history of coronary artery disease, scleroderma, crest syndrome, and strength is diverticulum, GERD, diabetes mellitus, hypertension, CVA, hypothyroidism, cryptogenic organizing pneumonia. The patient presented with complaints of cough low-grade fever shortness of breath and headache which has been ongoing since Thanksgiving.  She has history of surgery on her esophagus and has a fistula due to which she has once a month episodes of aspiration. Around Thanksgiving she had an episode of aspiration. She was placed on Ceftin and since she continued to have the similar symptoms change the antibiotic to Augmentin and then Ceftin again.   Past medical history:  Past Medical History  Diagnosis Date  . Coronary artery disease   . Dressler's syndrome   . Hypertension   . Dyslipidemia   . Diabetes mellitus   . CVA (cerebral infarction) 1991  . Macular degeneration   . GERD (gastroesophageal reflux disease)   . Diverticulosis   . IBS (irritable bowel syndrome)   . Zenker's diverticulum   . Emphysema   . Pulmonary hypertension   . Chronic sinusitis   . Fibromyalgia   . Hypothyroidism   . Sjogren's disease   . Raynaud's phenomenon   . Cervical disc disease   . Adenomatous colon polyp   . Scleroderma   . COPD (chronic obstructive pulmonary disease)   . Arthritis   . Blood transfusion   . Myocardial infarction may 27th 2007  . Pneumonia      HX PNEUMONIA  . Depression   . 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 Cancer     cervical cancer pre hysterectomy  . Sleep apnea     STOP BANG SCORE 5  . Legally blind   . CONSTIPATION, CHRONIC 06/26/2007    Qualifier: Diagnosis of  By: Koleen Distance CMA Duncan Dull), Hulan Saas      Consultants: None  Procedures: Swallow  Evaluation  Antibiotics: Vanc 12/21--> Cefepine 12/21-12/22 Zosyn 12/22-->  Subjective: Patient continues to improve. Cough and breathing have improved. Has had some difficulty with swallowing which is worse than her baseline.  Objective: Vital Signs  Filed Vitals:   06/16/13 1356 06/16/13 1719 06/16/13 2041 06/17/13 0426  BP: 128/55 146/53 126/51 143/60  Pulse: 84 82 79 82  Temp: 98 F (36.7 C) 98.3 F (36.8 C) 98.3 F (36.8 C) 98.3 F (36.8 C)  TempSrc: Oral Oral Oral Oral  Resp: 16 18 17 17   Height:      Weight:   63.095 kg (139 lb 1.6 oz)   SpO2: 98% 97% 98% 98%    Intake/Output Summary (Last 24 hours) at 06/17/13 0821 Last data filed at 06/17/13 0752  Gross per 24 hour  Intake   1110 ml  Output   2725 ml  Net  -1615 ml   Filed Weights   06/15/13 1820 06/15/13 2308 06/16/13 2041  Weight: 59.875 kg (132 lb) 59.8 kg (131 lb 13.4 oz) 63.095 kg (139 lb 1.6 oz)    General appearance: alert, cooperative, appears stated age and no distress Resp: coarse breath sounds bilaterally. No definite crackles. no wheezing. Cardio: regular rate and rhythm, S1, S2 normal, no murmur, click, rub or gallop GI: soft, non-tender; bowel sounds normal; no masses,  no organomegaly Extremities: extremities normal, atraumatic, no cyanosis or edema Neurologic: Alert and oriented X 3, normal strength  and tone. No focal deficits  Lab Results:  Basic Metabolic Panel:  Recent Labs Lab 06/15/13 1840 06/16/13 0606 06/17/13 0445  NA 133* 134* 136  K 3.2* 4.1 3.9  CL 94* 98 100  CO2 28 25 25   GLUCOSE 119* 174* 231*  BUN 10 10 16   CREATININE 0.70 0.64 0.72  CALCIUM 9.0 8.6 8.5   Liver Function Tests:  Recent Labs Lab 06/16/13 0606 06/17/13 0445  AST 13 27  ALT 14 29  ALKPHOS 70 72  BILITOT 0.2* 0.1*  PROT 6.7 6.4  ALBUMIN 3.3* 3.0*   CBC:  Recent Labs Lab 06/15/13 1840 06/16/13 0606 06/17/13 0445  WBC 11.1* 8.7 13.6*  NEUTROABS  --  7.8*  --   HGB 12.0 11.1* 10.3*   HCT 36.0 34.1* 31.8*  MCV 84.7 84.0 84.1  PLT 261 237 288   BNP (last 3 results)  Recent Labs  06/15/13 1840  PROBNP 190.2   CBG:  Recent Labs Lab 06/16/13 0743 06/16/13 1148 06/16/13 1626 06/16/13 2039 06/17/13 0750  GLUCAP 163* 223* 251* 143* 183*    Recent Results (from the past 240 hour(s))  CULTURE, BLOOD (ROUTINE X 2)     Status: None   Collection Time    06/15/13 10:05 PM      Result Value Range Status   Specimen Description BLOOD RIGHT HAND   Final   Special Requests BOTTLES DRAWN AEROBIC AND ANAEROBIC 10CC EACH   Final   Culture  Setup Time     Final   Value: 06/16/2013 09:21     Performed at [facility]   Culture     Final   Value:        BLOOD CULTURE RECEIVED NO GROWTH TO DATE CULTURE WILL BE HELD FOR 5 DAYS BEFORE ISSUING A FINAL NEGATIVE REPORT     Performed at [facility]   Report Status PENDING   Incomplete  CULTURE, BLOOD (ROUTINE X 2)     Status: None   Collection Time    06/16/13  1:15 AM      Result Value Range Status   Specimen Description BLOOD RIGHT HAND   Final   Special Requests BOTTLES DRAWN AEROBIC ONLY 1CC   Final   Culture  Setup Time     Final   Value: 06/16/2013 09:21     Performed at [facility]   Culture     Final   Value:        BLOOD CULTURE RECEIVED NO GROWTH TO DATE CULTURE WILL BE HELD FOR 5 DAYS BEFORE ISSUING A FINAL NEGATIVE REPORT     Performed at [facility]   Report Status PENDING   Incomplete      Studies/Results: Dg Chest 2 View  06/15/2013   CLINICAL DATA:  Weakness with short of breath and headaches.  EXAM: CHEST  2 VIEW  COMPARISON:  03/19/2012.  FINDINGS: Patchy airspace disease is present in the right upper lobe compatible with pneumonia. Chronic interstitial prominence. The cardiopericardial silhouette is within normal limits. Surgical clips at the thoracic inlet. Mediastinal contours are within normal limits, unchanged. Aortic arch atherosclerosis. Emphysema is present.  Blunting of both costophrenic angles. Coronary artery stent noted.  IMPRESSION: Right upper lobe pneumonia. Followup to ensure radiographic clearing and exclude an underlying lesion is recommended. Typically clearing will be observed at 8 weeks. Underlying emphysema.   Electronically Signed   By: Andreas Newport M.D.   On: 06/15/2013 20:01  Medications:  Scheduled: . aspirin  325 mg Oral BID  . enoxaparin (LOVENOX) injection  40 mg Subcutaneous Q24H  . feeding supplement (GLUCERNA SHAKE)  237 mL Oral Q2000  . imipramine  75 mg Oral QHS  . insulin lispro  0-15 Units Subcutaneous TID WC  . insulin lispro  0-5 Units Subcutaneous QHS  . levothyroxine  125 mcg Oral QAC breakfast  . methylPREDNISolone (SOLU-MEDROL) injection  60 mg Intravenous Q12H  . pantoprazole  40 mg Oral Daily  . piperacillin-tazobactam (ZOSYN)  IV  3.375 g Intravenous Q8H  . ticlopidine  250 mg Oral BID  . vancomycin  500 mg Intravenous Q12H   Continuous:  JYN:[id number], zolpidem  Assessment/Plan:  Principal Problem:   CAP (community acquired pneumonia) Active Problems:   HYPERLIPIDEMIA   HYPERTENSION   CORONARY ARTERY DISEASE   BOOP (bronchiolitis obliterans with organizing pneumonia)   ZENKER'S DIVERTICULUM   Systemic sclerosis   Diabetes mellitus    Recurrent CAP (community acquired pneumonia) versus Aspiration Pneumonia Since patient was on multiple different course of antibiotics as OP she was placed on broad spectrum coverage. Swallow evaluation has been done and there was evidence for dysphagia. Will proceed with barium swallow. Her GI is Dr. Jarold Motto with [facility]. Will consult them depending on results of barium swallow. Considering her history of cryptogenic organizing pneumonia it is also possible that her current presentation is due to the same. Will decrease steroid dose. She is on chronic steroids at home.  Dysphagia See above. Barium swallow ordered. SLP following.  History of Coronary artery  disease  Appears to be stable. Continue her on aspirin  History of Diabetes mellitus Type 2 Continue SSI. Patient's family has brought in Lispro insulin as she doesn't tolerate Aspart.   Hypothyroidism  Continue Synthroid   History of GERD  Protonix   On Chronic Steroids Presumably for lung disease. Now on Solumedrol  Code Status: Full Code  DVT Prophylaxis: Lovenox    Family Communication: Discussed with patient  Disposition Plan: Home when better. Mobilize.    LOS: 2 days   [facility]  [facility] Pager [phone] 06/17/2013, 8:21 AM  If 8PM-8AM, please contact night-coverage at [email or web address], password [facility]

## 2013-06-18 ENCOUNTER — Encounter (HOSPITAL_COMMUNITY)
Admission: EM | Disposition: A | Discharge status: Home / Self Care | Payer: Self-pay | Source: Home / Self Care | Attending: Internal Medicine

## 2013-06-18 ENCOUNTER — Encounter (HOSPITAL_COMMUNITY): Payer: Self-pay | Admitting: Gastroenterology

## 2013-06-18 DIAGNOSIS — D638 Anemia in other chronic diseases classified elsewhere: Secondary | ICD-10-CM

## 2013-06-18 DIAGNOSIS — R1310 Dysphagia, unspecified: Secondary | ICD-10-CM

## 2013-06-18 DIAGNOSIS — K222 Esophageal obstruction: Secondary | ICD-10-CM

## 2013-06-18 HISTORY — PX: ESOPHAGOGASTRODUODENOSCOPY (EGD) WITH ESOPHAGEAL DILATION: SHX5812

## 2013-06-18 LAB — CBC
HCT: 32.5 % — ABNORMAL LOW (ref 36.0–46.0)
Hemoglobin: 10.6 g/dL — ABNORMAL LOW (ref 12.0–15.0)
MCH: 27.6 pg (ref 26.0–34.0)
MCHC: 32.6 g/dL (ref 30.0–36.0)
MCV: 84.6 fL (ref 78.0–100.0)
Platelets: 280 10*3/uL (ref 150–400)
RBC: 3.84 MIL/uL — ABNORMAL LOW (ref 3.87–5.11)
RDW: 13.5 % (ref 11.5–15.5)
WBC: 10.8 10*3/uL — ABNORMAL HIGH (ref 4.0–10.5)

## 2013-06-18 LAB — GLUCOSE, CAPILLARY
Glucose-Capillary: 111 mg/dL — ABNORMAL HIGH (ref 70–99)
Glucose-Capillary: 79 mg/dL (ref 70–99)
Glucose-Capillary: 186 mg/dL — ABNORMAL HIGH (ref 70–99)
Glucose-Capillary: 124 mg/dL — ABNORMAL HIGH (ref 70–99)

## 2013-06-18 LAB — BASIC METABOLIC PANEL
BUN: 17 mg/dL (ref 6–23)
CO2: 28 mEq/L (ref 19–32)
Calcium: 8.4 mg/dL (ref 8.4–10.5)
Chloride: 99 mEq/L (ref 96–112)
Creatinine, Ser: 0.78 mg/dL (ref 0.50–1.10)
GFR calc Af Amer: 89 mL/min — ABNORMAL LOW (ref >90)
GFR calc non Af Amer: 77 mL/min — ABNORMAL LOW (ref >90)
Glucose, Bld: 129 mg/dL — ABNORMAL HIGH (ref 70–99)
Potassium: 3.5 mEq/L (ref 3.5–5.1)
Sodium: 138 mEq/L (ref 135–145)

## 2013-06-18 SURGERY — ESOPHAGOGASTRODUODENOSCOPY (EGD) WITH ESOPHAGEAL DILATION
Anesthesia: Moderate Sedation

## 2013-06-18 MED ORDER — FENTANYL CITRATE 0.05 MG/ML IJ SOLN
INTRAMUSCULAR | Status: DC | PRN
  Administered 2013-06-18 (×2): 25 ug via INTRAVENOUS

## 2013-06-18 MED ORDER — TICLOPIDINE HCL 250 MG PO TABS
250.0000 mg | ORAL_TABLET | Freq: Two times a day (BID) | ORAL | Status: DC
  Administered 2013-06-18 – 2013-06-19 (×3): 250 mg via ORAL
  Filled 2013-06-18 (×4): qty 1

## 2013-06-18 MED ORDER — AMOXICILLIN-POT CLAVULANATE 875-125 MG PO TABS
1.0000 | ORAL_TABLET | Freq: Two times a day (BID) | ORAL | Status: DC
  Administered 2013-06-19: 1 via ORAL
  Filled 2013-06-18 (×2): qty 1

## 2013-06-18 MED ORDER — SODIUM CHLORIDE 0.9 % IV SOLN
1.5000 g | Freq: Four times a day (QID) | INTRAVENOUS | Status: DC
  Filled 2013-06-18 (×3): qty 1.5

## 2013-06-18 MED ORDER — MIDAZOLAM HCL 10 MG/2ML IJ SOLN
INTRAMUSCULAR | Status: DC | PRN
  Administered 2013-06-18: 1 mg via INTRAVENOUS
  Administered 2013-06-18 (×2): 2 mg via INTRAVENOUS

## 2013-06-18 MED ORDER — PREDNISONE 5 MG PO TABS
5.0000 mg | ORAL_TABLET | Freq: Every day | ORAL | Status: DC
  Administered 2013-06-18 – 2013-06-19 (×2): 5 mg via ORAL
  Filled 2013-06-18 (×2): qty 1

## 2013-06-18 MED ORDER — FENTANYL CITRATE 0.05 MG/ML IJ SOLN
INTRAMUSCULAR | Status: AC
  Filled 2013-06-18: qty 2

## 2013-06-18 MED ORDER — BUTAMBEN-TETRACAINE-BENZOCAINE 2-2-14 % EX AERO
INHALATION_SPRAY | CUTANEOUS | Status: DC | PRN
  Administered 2013-06-18: 2 via TOPICAL

## 2013-06-18 MED ORDER — SODIUM CHLORIDE 0.9 % IV SOLN
1.5000 g | Freq: Four times a day (QID) | INTRAVENOUS | Status: AC
  Administered 2013-06-18 (×3): 1.5 g via INTRAVENOUS
  Filled 2013-06-18 (×3): qty 1.5

## 2013-06-18 MED ORDER — DIPHENHYDRAMINE HCL 50 MG/ML IJ SOLN
INTRAMUSCULAR | Status: AC
  Filled 2013-06-18: qty 1

## 2013-06-18 MED ORDER — SODIUM CHLORIDE 0.9 % IV SOLN: INTRAVENOUS | Status: DC

## 2013-06-18 MED ORDER — PREDNISONE 50 MG PO TABS
50.0000 mg | ORAL_TABLET | Freq: Every day | ORAL | Status: DC
  Filled 2013-06-18: qty 1

## 2013-06-18 MED ORDER — MIDAZOLAM HCL 5 MG/ML IJ SOLN
INTRAMUSCULAR | Status: AC
  Filled 2013-06-18: qty 2

## 2013-06-18 NOTE — Consult Note (Signed)
[facility] Consult: 8:26 AM 06/18/2013  LOS: 3 days    Referring Provider: Dr Barnie Del  Primary Care Physician:  Georgianne Fick, MD Primary Gastroenterologist:  Dr. Sheryn Bison    Reason for Consultation:  Dysphagia.    HPI: ONEIDA MCKAMEY is a 79 y.o. female with history of coronary artery disease, COPD (emphysema), BOOP, type 2 diabetes, scleroderma with CREST syndrome, fibromyalgia, CVA.   GI hx of functional, chronic constipation, remote adenomatous polyps dating to the 1990s  Long history of multifactorial dysphagia from presbyesophagus, cervical spondylosis, zenker's diverticulum, CREST syndrome. S/p multiple gyn surgeries and exploratory laparoscopies by Gyn surgeons with findings of adhesive disease.  Last colonoscopy was 11/2009 showing diverticular adhesions associated sigmoid stenosis and colonic lipoma.  She has had abnormal esophagrams on multiple occasions showing distal esophageal obstruction in 2009.  Last EGD was 06/2009 showing healthy mucosa, prominent Zenker's diverticulum and diagnoses of motility disorder due to CREST/Sjogren's, Zenker's and chronic dry mouth.  Esophagus was not dilated. She has had abnormal manometry studies at [facility] several years ago.  Had 02/2012 laparoscopic LOA and recto-sigmoid resection with hand-sewn descending colon to rectal anastomosis and rigid proctoscopy to address chronic abdominal pain. At follow ups with Dr Michaell Cowing was still using Miralax for constipation, less abdominal soreness.   Presented to ED 12/21 with resp distress: fatigue and dyspnea despite courses of Ceftin followed by Z-pack, followed by Ceftin. RUL pneumonia on chest films.   SLP evaluation leading to esophagram. This shows flash laryngeal penetration with thin barium, distal esophageal  stricture with a minimum luminal diameter of 10 mm, no change in small Zenker's diverticulum and small hiatal hernia. After starting D 3 diet with aspiration precautions pt stated "That was the easiest meal I've had in a long time." Dysphagia manifests for her as coughing vigorously when solids or crumbly foods (cornbread) hit the back of her throat.  Liquids and pudding type foods not a problem.  Never gets sense of food hanging up at level of GE junction.  The dysphagia is stable, no perception of its progressing over time. Abdominal pain not as bad as it was pre 2013 surgery but still needs to use Miralax for constipation.        Past Medical History  Diagnosis Date  . Coronary artery disease   . Dressler's syndrome   . Hypertension   . Dyslipidemia   . Diabetes mellitus   . CVA (cerebral infarction) 1991  . Macular degeneration   . GERD (gastroesophageal reflux disease)   . Diverticulosis   . IBS (irritable bowel syndrome)   . Zenker's diverticulum   . Emphysema   . Pulmonary hypertension   . Chronic sinusitis   . Fibromyalgia   . Hypothyroidism   . Sjogren's disease   . Raynaud's phenomenon   . Cervical disc disease   . Adenomatous colon polyp   . Scleroderma   . COPD (chronic obstructive pulmonary disease)   . Arthritis   . Blood transfusion   . Myocardial infarction may 27th 2007  . Pneumonia  HX PNEUMONIA  . Depression   . 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 Cancer     cervical cancer pre hysterectomy  . Sleep apnea     STOP BANG SCORE 5  . Legally blind   . CONSTIPATION, CHRONIC 06/26/2007    Qualifier: Diagnosis of  By: Koleen Distance CMA Duncan Dull), Hulan Saas      Past Surgical History  Procedure Laterality Date  . Total abdominal hysterectomy  1973  . Bilateral oophorectomy  2002  . Spinal fusion  1997    and implantation of a plate   . Lobe thyroid removal    . Thyroid lobectomy  1991    removal of left lobe thyroid  . Cholecystectomy   1965  . Appendectomy    . Cataract extraction    . Bronchoscopy  02-22-09  . Stented cardiac  artery  2007 / 2007 / 2010    X 3 STENT  . Proctoscopy  03/18/2012    Procedure: PROCTOSCOPY;  Surgeon: [name] Sportsman, MD;  Location: [facility];  Service: General;  Laterality: N/A;  rigid procto  . Colon surgery  2014    colon resection    Prior to Admission medications   Medication Sig Start Date End Date Taking? Authorizing Provider  amLODipine (NORVASC) 10 MG tablet Take 10 mg by mouth every morning. Once a day 07/06/11  Yes Historical Provider, MD  aspirin 325 MG tablet Take 325 mg by mouth 2 (two) times daily.    Yes Historical Provider, MD  beta carotene w/minerals (OCUVITE) tablet Take 1 tablet by mouth daily.   Yes Historical Provider, MD  cefUROXime (CEFTIN) 500 MG tablet Take 500 mg by mouth 2 (two) times daily with a meal. For 10 days 06/06/13  Yes Historical Provider, MD  esomeprazole (NEXIUM) 40 MG capsule Take 1 capsule (40 mg total) by mouth daily before breakfast. 01/14/13  Yes Pricilla Riffle, MD  imipramine (TOFRANIL) 25 MG tablet Take 75 mg by mouth at bedtime.  03/22/11  Yes Historical Provider, MD  insulin lispro (HUMALOG) 100 UNIT/ML injection Inject 6 Units into the skin 2 (two) times daily. DO NOT SUBSTITUTE PER PATIENT REQUEST   Yes Historical Provider, MD  levothyroxine (SYNTHROID, LEVOTHROID) 125 MCG tablet Take 125 mcg by mouth every morning.    Yes Historical Provider, MD  predniSONE (DELTASONE) 5 MG tablet Take 5 mg by mouth daily.   Yes Historical Provider, MD  ticlopidine (TICLID) 250 MG tablet Take 1 tablet (250 mg total) by mouth 2 (two) times daily. 05/16/13  Yes Pricilla Riffle, MD  triazolam (HALCION) 0.25 MG tablet Take 0.25 mg by mouth at bedtime as needed. For sleep   Yes Historical Provider, MD  amoxicillin-clavulanate (AUGMENTIN) 125-31.25 MG/5ML suspension Take 125 mg by mouth 3 (three) times daily.    Historical Provider, MD  mometasone (NASONEX) 50 MCG/ACT nasal  spray Place 2 sprays into the nose daily. As needed for congestion.    Historical Provider, MD    Scheduled Meds: . aspirin  325 mg Oral BID  . enoxaparin (LOVENOX) injection  40 mg Subcutaneous Q24H  . feeding supplement (GLUCERNA SHAKE)  237 mL Oral Q2000  . imipramine  75 mg Oral QHS  . insulin lispro  0-15 Units Subcutaneous TID WC  . insulin lispro  0-5 Units Subcutaneous QHS  . levothyroxine  125 mcg Oral QAC breakfast  . methylPREDNISolone (SOLU-MEDROL) injection  60 mg Intravenous Daily  . pantoprazole  40 mg Oral Daily  . piperacillin-tazobactam (ZOSYN)  IV  3.375 g Intravenous Q8H  . ticlopidine  250 mg Oral BID  . vancomycin  500 mg Intravenous Q12H   Infusions:   PRN Meds: ibuprofen, senna, zolpidem   Allergies as of 06/15/2013 - Review Complete 06/15/2013  Allergen Reaction Noted  . Lantus [insulin glargine]  03/14/2012  . Metoprolol-hydrochlorothiazide    . Novolog [insulin aspart]  03/18/2012  . Sulfonamide derivatives Hives   . Titanium  03/27/2011  . Adhesive [tape] Hives and Rash 03/13/2012    Family History  Problem Relation Age of Onset  . Other Mother 25    Died from sepsis  . Stroke Father 62    died  . Heart disease Father   . Cancer Sister     lung  . Cancer Brother     lung    History   Social History  . Marital Status: Divorced    Spouse Name: N/A    Number of Children: N/A  . Years of Education: N/A   Occupational History  . retired    Social History Main Topics  . Smoking status: Former Smoker -- 1.00 packs/day for 40 years    Types: Cigarettes    Quit date: 06/26/1988  . Smokeless tobacco: Never Used     Comment: 40 pack years  . Alcohol Use: No  . Drug Use: No  . Sexual Activity: Not on file   Other Topics Concern  . Not on file   Social History Narrative  . No narrative on file    REVIEW OF SYSTEMS: Constitutional:  Fatigue improved.  No sweats or chills.  ENT:  No nose bleeds Pulm:  SOB and productive cough  improved.  CV:  No palpitations, no LE edema.  GU:  No hematuria, no frequency GI:  Per HPI Heme:  No unusual bruising or bleeding   Transfusions:  None per hx review Neuro:  + headaches, + legally blind Derm:  No itching, no rash or sores.  Endocrine:  No sweats or chills.  No polyuria or dysuria Immunization:  Does not take flu shot due to bad reaction in past.  Travel:  None beyond local counties in last few months.    PHYSICAL EXAM: Vital signs in last 24 hours: Filed Vitals:   06/18/13 0402  BP: 160/68  Pulse: 78  Temp: 97.6 F (36.4 C)  Resp: 17   Wt Readings from Last 3 Encounters:  06/17/13 62.914 kg (138 lb 11.2 oz)  05/15/13 59.421 kg (131 lb)  09/16/12 58.968 kg (130 lb)    General: looks well, much better than expected from her chart Head:  No assymetry, no trauma  Eyes:  No icterus or pallor Ears:  Not HOH  Nose:  No discharge Mouth:  Clear oral mm.  Not dry as she has been chewing gum. Neck:  No mass or JVD Lungs:  Crackles in left base, no cough, no dyspnea Heart: RRR Abdomen:  Soft, NT, ND.  No bruits, masses or organomegaly.   Rectal: deferred   Musc/Skeltl: no joint contractures or swelling Extremities:  Some loss of skin folds in fingers c/w Sjogran's  Neurologic:  No tremor, no limb weakness.  Not confused.  Unable to see my face due to blindness Skin:  Sjogren's associated loss of natural skin folds in face and limbs.  No telangectasias Tattoos:  none Nodes:  No cervical adenopathy   Psych:  Pleasant, in good spirits and relaxed.  Intelligent  and engaged.   Intake/Output from previous day: 12/23 0701 - 12/24 0700 In: 920 [P.O.:720; IV Piggyback:200] Out: 650 [Urine:650] Intake/Output this shift:    LAB RESULTS:  Recent Labs  06/16/13 0606 06/17/13 0445 06/18/13 0350  WBC 8.7 13.6* 10.8*  HGB 11.1* 10.3* 10.6*  HCT 34.1* 31.8* 32.5*  PLT 237 288 280   BMET Lab Results  Component Value Date   NA 138 06/18/2013   NA 136  06/17/2013   NA 134* 06/16/2013   K 3.5 06/18/2013   K 3.9 06/17/2013   K 4.1 06/16/2013   CL 99 06/18/2013   CL 100 06/17/2013   CL 98 06/16/2013   CO2 28 06/18/2013   CO2 25 06/17/2013   CO2 25 06/16/2013   GLUCOSE 129* 06/18/2013   GLUCOSE 231* 06/17/2013   GLUCOSE 174* 06/16/2013   BUN 17 06/18/2013   BUN 16 06/17/2013   BUN 10 06/16/2013   CREATININE 0.78 06/18/2013   CREATININE 0.72 06/17/2013   CREATININE 0.64 06/16/2013   CALCIUM 8.4 06/18/2013   CALCIUM 8.5 06/17/2013   CALCIUM 8.6 06/16/2013   LFT  Recent Labs  06/16/13 0606 06/17/13 0445  PROT 6.7 6.4  ALBUMIN 3.3* 3.0*  AST 13 27  ALT 14 29  ALKPHOS 70 72  BILITOT 0.2* 0.1*   PT/INR Lab Results  Component Value Date   INR 1.08 03/18/2012   INR 0.99 01/07/2010   INR 0.9 12/04/2008    RADIOLOGY STUDIES: Dg Esophagus 06/17/2013   .  COMPARISON:  Esophagram dated 09/17/2007  FLUOROSCOPY TIME:  1 min 3 seconds  FINDINGS: There is a 2 cm Zenker's diverticulum, unchanged since the prior exam. The patient did have flash laryngeal penetration with thin barium.  The mucosa and motility of the remainder of the esophagus appears normal. There is a distal esophageal stricture. A 13 mm barium tablet did not pass despite repeated swallows of water and thin and thick barium. Estimate the luminal diameter to be approximately 10 mm at the level of the stricture which is at the same location demonstrated on the prior exam but the tablet did not pass on this exam.  There is a small sliding hiatal hernia, unchanged.  IMPRESSION: 1. The patient does have flash laryngeal penetration with thin barium. 2. Distal esophageal stricture with a minimum luminal diameter of 10 mm. 3. Small Zenker's diverticulum, unchanged. 4. Small hiatal hernia, unchanged.   Electronically Signed   By: Geanie Cooley M.D.   On: 06/17/2013 16:10   2 View chest 06/15/2013 IMPRESSION:  Right upper lobe pneumonia. Followup to ensure radiographic clearing    and exclude an underlying lesion is recommended. Typically clearing  will be observed at 8 weeks. Underlying emphysema.   ENDOSCOPIC STUDIES: 11/2009  Colonoscopy  Dr Arlyce Dice INDICATIONS: abnormal CT of abdomen Question of neoplasm in  sigmoid colon by CT vs diverticular changes  MEDICATIONS: MAC sedation, administered by CRNA ENDOSCOPIC IMPRESSION:  1) Stenotic area of sigmoid colon secondary to diverticular  changes and adhesions  2) Diverticula in the descending colon  3) colonic lipoma  4) Otherwise normal examination  RECOMMENDATIONS:Fiber supplementation  Miralax  Call office to schedule  f/u appointment with Dr. Jarold Motto  06/2009  EGD  Dr Jarold Motto ENDOSCOPIC IMPRESSION:  1) Normal stomach  2) Normal esophagus  3) Normal duodenum  1.ESOPHAGEAL MOTILITY DISORDER ASSOCIATED WITH CREST SYNDROME  AND SJOGREN'S SYNDROME  2.CHRONIC DRY MOUTH SYNDROME  3.ZENKER'S DIVERTICULUM  RECOMMENDATIONS:  1) continue current medications   IMPRESSION:   *  Chronic multifactorial dysphagia.  Long standing problem.  Now with RUL PNA likely from aspiration.   *  Normocytic anemia.   *  S/p multiple abd/pelvic surgeries.  Latest was 02/2012 with recto-sigmoid resection, LOA for chronic abdominal pain and diverticular/adhesive related sigmoid stricturing.     PLAN:     *  EGD with possible esophageal dilatation. Timing of this to be determined.  She is NPO, last got Ticlid and Lovenox  Yesterday.  Dr Arlyce Dice d/w pt the risks, benefits and she is agreeable    Jennye Moccasin  06/18/2013, 8:26 AM Pager: [phone]  GI Attending Note   Chart was reviewed and patient was examined. X-rays and lab were reviewed.    Speech is probably 2 to a combination of an esophageal motility disorder and the fixed distal stricture.  Both could be contributing to aspiration.  Plan to proceed with upper endoscopy with dilatation. Risks, alternatives, and complications of the procedure, including bleeding,  perforation, and possible need for surgery, were explained to the patient.  Patient's questions were answered.   Barbette Hair. Arlyce Dice, M.D., [facility] Cell [phone]

## 2013-06-18 NOTE — Progress Notes (Signed)
Pt SpO2 at rest 99% on RA. Pt walked entire length of hall and back, no sob, no doe. SpO2 while ambulating 99-100% on RA.

## 2013-06-18 NOTE — Progress Notes (Signed)
There was no obvious stricture at the GE junction.  Balloon dilation was performed.  There was no heme.  Okay to resume ticlid.  If she still complains of dysphagia then I would attribute this to a motility disorder.

## 2013-06-18 NOTE — Progress Notes (Signed)
Speech Language Pathology Treatment: Dysphagia  Patient Details Name: [name] MRN: [id number] DOB: 04/11/1931 Today's Date: 06/18/2013 Time: 4098-1191 SLP Time Calculation (min): 17 min  Assessment / Plan / Recommendation Clinical Impression  Treatment focused on education. Per GI Dr., barium swallow showed a small stricture. Patient NPO for EGD today with possible dilation. Patient able to independently verbalize understanding and use of swallowing precautions pertaining to esophageal dysfunction. Education complete including handouts provided for dysphagia 3 diet which patient reports greater ease of swallowing vs a regular texture diet. Cannot r/o episodic aspiration of GER given known esophageal deficits which are chronic in nature although current diet appropriate. No further SLP f/u indicated. Defer further f/u and recommendations to MD.    HPI HPI: The patient presents with complaints of cough low-grade fever shortness of breath and headache which has been ongoing since Thanksgiving.      SLP Plan  All goals met    Recommendations Diet recommendations: Dysphagia 3 (mechanical soft);Thin liquid Liquids provided via: Cup;Straw Medication Administration: Crushed with puree Supervision: Patient able to self feed Compensations: Slow rate;Small sips/bites;Follow solids with liquid Postural Changes and/or Swallow Maneuvers: Out of bed for meals;Seated upright 90 degrees;Upright 30-60 min after meal              Oral Care Recommendations: Oral care BID;Patient independent with oral care Follow up Recommendations: None Plan: All goals met    GO   Renee Lango MA, CCC-SLP [phone]   [name] 06/18/2013, 9:30 AM

## 2013-06-18 NOTE — Op Note (Signed)
[facility] [address] [place], [address]   ENDOSCOPY PROCEDURE REPORT  PATIENT: Renee, [name]  MR#: [id number] BIRTHDATE: August 30, 1930 , 82  yrs. old GENDER: Female ENDOSCOPIST: Louis Meckel, MD ASSISTANT:   Jamal Maes, RN Dorisann Frames, technician REFERRED BY: PROCEDURE DATE:  06/18/2013 PROCEDURE:   EGD with balloon dilatation ASA CLASS:   Class II INDICATIONS:xray showed stricture. MEDICATIONS: These medications were titrated to patient response per physician's verbal order, Versed 5 mg IV, and Fentanyl 50 mcg IV  TOPICAL ANESTHETIC:   Cetacaine Spray  DESCRIPTION OF PROCEDURE:   After the risks benefits and alternatives of the procedure were thoroughly explained, informed consent was obtained.  The [id number] ([id number])  endoscope was introduced through the mouth  and advanced to the third portion of the duodenum ,      The instrument was slowly withdrawn as the mucosa was carefully examined.    There was a small diverticulum in the very proximal esophagus.   The remainder of the upper endoscopy exam was otherwise normal. Because of x-ray finding of an esophageal stricture dilatation was performed.   Dilation was then performed at the   GE junction utilizing a 16.5 mm balloon and an 18 mm balloon.  There was minimal to no resistance to the 16.4mm balloon.  The 18mm balloon was inflated for 30 seconds.  There was minimal resistance.  There was no heme.    COMPLICATIONS: There were no complications. ENDOSCOPIC IMPRESSION: 1.  small Zenker's diverticulum 2.  small early esophageal stricture-status post balloon dilation  RECOMMENDATIONS: repeat dilation as needed  eSigned:  Louis Meckel, MD 06/18/2013 11:41 AM  QM:VHQIO Renee Johns, MD Renee Bison, MD

## 2013-06-18 NOTE — Progress Notes (Signed)
[facility] PROGRESS NOTE  Assessment/Plan: Dysphagia, unspecified(787.20) - barium esophagus showed The patient does have flash laryngeal penetration with thin barium.  Distal esophageal stricture with a minimum luminal diameter of 10 mm. - EGD on 12.24.2014 showed no strictures. balloon dilation perform. - resume diet. - PPI.  Aspiration PNA - change antibiotics to Unasyn.  Diabetes mellitus - change to SSI.  Systemic sclerosis - cont home meds.  HYPERTENSION - mild high, cont to monitor.  Code Status: Full Code  DVT Prophylaxis: Lovenox  Family Communication: Discussed with patient  Disposition Plan: Home when better. Mobilize.    Consultants:  GI  Procedures:  EGD  Antibiotics:  Augmentin  HPI/Subjective: No compalins. She relates coughing episodes with eating  Objective: Filed Vitals:   06/18/13 1150 06/18/13 1200 06/18/13 1210 06/18/13 1212  BP: 132/40 145/42 155/41   Pulse: 79 76 77   Temp:    99.7 F (37.6 C)  TempSrc:    Oral  Resp: 13 14 14    Height:      Weight:      SpO2: 93% 95% 94%     Intake/Output Summary (Last 24 hours) at 06/18/13 1301 Last data filed at 06/18/13 1013  Gross per 24 hour  Intake    530 ml  Output    650 ml  Net   -120 ml   Filed Weights   06/15/13 2308 06/16/13 2041 06/17/13 2023  Weight: 59.8 kg (131 lb 13.4 oz) 63.095 kg (139 lb 1.6 oz) 62.914 kg (138 lb 11.2 oz)    Exam:  General: Alert, awake, oriented x3, in no acute distress.  HEENT: No bruits, no goiter.  Heart: Regular rate and rhythm, without murmurs, rubs, gallops.  Lungs: Good air movement, bilateral air movement.  Abdomen: Soft, nontender, nondistended, positive bowel sounds.    Data Reviewed: Basic Metabolic Panel:  Recent Labs Lab 06/15/13 1840 06/16/13 0606 06/17/13 0445 06/18/13 0350  NA 133* 134* 136 138  K 3.2* 4.1 3.9 3.5  CL 94* 98 100 99  CO2 28 25 25 28   GLUCOSE 119* 174* 231* 129*  BUN 10 10 16 17   CREATININE  0.70 0.64 0.72 0.78  CALCIUM 9.0 8.6 8.5 8.4   Liver Function Tests:  Recent Labs Lab 06/16/13 0606 06/17/13 0445  AST 13 27  ALT 14 29  ALKPHOS 70 72  BILITOT 0.2* 0.1*  PROT 6.7 6.4  ALBUMIN 3.3* 3.0*   No results found for this basename: LIPASE, AMYLASE,  in the last 168 hours No results found for this basename: AMMONIA,  in the last 168 hours CBC:  Recent Labs Lab 06/15/13 1840 06/16/13 0606 06/17/13 0445 06/18/13 0350  WBC 11.1* 8.7 13.6* 10.8*  NEUTROABS  --  7.8*  --   --   HGB 12.0 11.1* 10.3* 10.6*  HCT 36.0 34.1* 31.8* 32.5*  MCV 84.7 84.0 84.1 84.6  PLT 261 237 288 280   Cardiac Enzymes: No results found for this basename: CKTOTAL, CKMB, CKMBINDEX, TROPONINI,  in the last 168 hours BNP (last 3 results)  Recent Labs  06/15/13 1840  PROBNP 190.2   CBG:  Recent Labs Lab 06/17/13 1138 06/17/13 1712 06/17/13 2116 06/18/13 0746 06/18/13 1230  GLUCAP 121* 279* 144* 111* 79    Recent Results (from the past 240 hour(s))  CULTURE, BLOOD (ROUTINE X 2)     Status: None   Collection Time    06/15/13 10:05 PM      Result Value Range Status  Specimen Description BLOOD RIGHT HAND   Final   Special Requests BOTTLES DRAWN AEROBIC AND ANAEROBIC 10CC EACH   Final   Culture  Setup Time     Final   Value: 06/16/2013 09:21     Performed at [facility]   Culture     Final   Value:        BLOOD CULTURE RECEIVED NO GROWTH TO DATE CULTURE WILL BE HELD FOR 5 DAYS BEFORE ISSUING A FINAL NEGATIVE REPORT     Performed at [facility]   Report Status PENDING   Incomplete  CULTURE, BLOOD (ROUTINE X 2)     Status: None   Collection Time    06/16/13  1:15 AM      Result Value Range Status   Specimen Description BLOOD RIGHT HAND   Final   Special Requests BOTTLES DRAWN AEROBIC ONLY 1CC   Final   Culture  Setup Time     Final   Value: 06/16/2013 09:21     Performed at [facility]   Culture     Final   Value:        BLOOD CULTURE RECEIVED  NO GROWTH TO DATE CULTURE WILL BE HELD FOR 5 DAYS BEFORE ISSUING A FINAL NEGATIVE REPORT     Performed at [facility]   Report Status PENDING   Incomplete     Studies: Dg Esophagus  06/17/2013   CLINICAL DATA:  Dysphagia. History of Zenker's diverticulum and distal esophageal stricture. Scleroderma.  EXAM: ESOPHOGRAM/BARIUM SWALLOW  TECHNIQUE: Single contrast examination was performed using thin barium and 13 mm barium tablet.  COMPARISON:  Esophagram dated 09/17/2007  FLUOROSCOPY TIME:  1 min 3 seconds  FINDINGS: There is a 2 cm Zenker's diverticulum, unchanged since the prior exam. The patient did have flash laryngeal penetration with thin barium.  The mucosa and motility of the remainder of the esophagus appears normal. There is a distal esophageal stricture. A 13 mm barium tablet did not pass despite repeated swallows of water and thin and thick barium. Estimate the luminal diameter to be approximately 10 mm at the level of the stricture which is at the same location demonstrated on the prior exam but the tablet did not pass on this exam.  There is a small sliding hiatal hernia, unchanged.  IMPRESSION: 1. The patient does have flash laryngeal penetration with thin barium. 2. Distal esophageal stricture with a minimum luminal diameter of 10 mm. 3. Small Zenker's diverticulum, unchanged. 4. Small hiatal hernia, unchanged.   Electronically Signed   By: Geanie Cooley M.D.   On: 06/17/2013 16:10    Scheduled Meds: . [START ON 06/19/2013] amoxicillin-clavulanate  1 tablet Oral Q12H  . ampicillin-sulbactam (UNASYN) IV  1.5 g Intravenous Q6H  . aspirin  325 mg Oral BID  . feeding supplement (GLUCERNA SHAKE)  237 mL Oral Q2000  . imipramine  75 mg Oral QHS  . insulin lispro  0-15 Units Subcutaneous TID WC  . insulin lispro  0-5 Units Subcutaneous QHS  . levothyroxine  125 mcg Oral QAC breakfast  . pantoprazole  40 mg Oral Daily  . [START ON 06/19/2013] predniSONE  50 mg Oral Q breakfast  .  ticlopidine  250 mg Oral BID   Continuous Infusions:    Marinda Elk  [facility] Pager [phone]. If 8PM-8AM, please contact night-coverage at [email or web address], password [facility] 06/18/2013, 1:01 PM  LOS: 3 days

## 2013-06-19 ENCOUNTER — Encounter (HOSPITAL_COMMUNITY): Payer: Self-pay | Admitting: Gastroenterology

## 2013-06-19 DIAGNOSIS — J310 Chronic rhinitis: Secondary | ICD-10-CM

## 2013-06-19 LAB — GLUCOSE, CAPILLARY
Glucose-Capillary: 91 mg/dL (ref 70–99)
Glucose-Capillary: 146 mg/dL — ABNORMAL HIGH (ref 70–99)

## 2013-06-19 MED ORDER — AMOXICILLIN-POT CLAVULANATE 600-42.9 MG/5ML PO SUSR: 875.0000 mg | Freq: Two times a day (BID) | ORAL | Status: DC

## 2013-06-19 NOTE — Progress Notes (Signed)
          Daily Rounding Note  06/19/2013, 11:40 AM  LOS: 4 days   SUBJECTIVE:       *Dysphagia slightly improved following esophageal dilitation.**  OBJECTIVE:         Vital signs in last 24 hours:    Temp:  [98.1 F (36.7 C)-99.7 F (37.6 C)] 98.2 F (36.8 C) (12/25 1000) Pulse Rate:  [71-81] 75 (12/25 1000) Resp:  [13-18] 18 (12/25 1000) BP: (132-157)/(40-69) 145/53 mmHg (12/25 1000) SpO2:  [93 %-97 %] 96 % (12/25 1000) Weight:  [60.646 kg (133 lb 11.2 oz)] 60.646 kg (133 lb 11.2 oz) (12/24 2100) Last BM Date: 06/18/13 *  Intake/Output from previous day: 12/24 0701 - 12/25 0700 In: 840 [P.O.:840] Out: 1850 [Urine:1850]  Intake/Output this shift: Total I/O In: 240 [P.O.:240] Out: 150 [Urine:150]  Lab Results:  Recent Labs  06/17/13 0445 06/18/13 0350  WBC 13.6* 10.8*  HGB 10.3* 10.6*  HCT 31.8* 32.5*  PLT 288 280   BMET  Recent Labs  06/17/13 0445 06/18/13 0350  NA 136 138  K 3.9 3.5  CL 100 99  CO2 25 28  GLUCOSE 231* 129*  BUN 16 17  CREATININE 0.72 0.78  CALCIUM 8.5 8.4   LFT  Recent Labs  06/17/13 0445  PROT 6.4  ALBUMIN 3.0*  AST 27  ALT 29  ALKPHOS 72  BILITOT 0.1*   PT/INR No results found for this basename: LABPROT, INR,  in the last 72 hours Hepatitis Panel No results found for this basename: HEPBSAG, HCVAB, HEPAIGM, HEPBIGM,  in the last 72 hours  Studies/Results: No results found.  ASSESMENT:   * Chronic multifactorial dysphagia. Long standing problem. Now with RUL PNA likely from aspiration.  Slight improvement following esophageal dilitation. EGD 06/18/13 with balloon dilatation of small stricture at GE junction, small Zenker's diverticulum. .  * Normocytic anemia.  * S/p multiple abd/pelvic surgeries. Latest was 02/2012 with recto-sigmoid resection, LOA for chronic abdominal pain and diverticular/adhesive related sigmoid stricturing.      PLAN   *No further GI  workup or Rx.   Followup with Dr. Sheryn Bison **    Jennye Moccasin  06/19/2013, 11:40 AM Pager: [phone]

## 2013-06-19 NOTE — Progress Notes (Addendum)
Discussed discharge instructions and medications with pt. Pt showed no barriers to discharge. IV removed. Tele removed. Pt discharged to home with family. Assessment unchanged from morning. Pt's Humalog pen given back to pt, as well as one dose of PO Augmentin as pharmacies will not be open today, and the medication is ordered twice daily.

## 2013-06-19 NOTE — Discharge Summary (Signed)
Physician Discharge Summary  Renee LENGACHER WUJ:[id number] DOB: 1931-01-21 DOA: 06/15/2013  PCP: Georgianne Fick, MD  Admit date: 06/15/2013 Discharge date: 06/19/2013  Time spent: 40 minutes  Recommendations for Outpatient Follow-up:  1. Follow up with GI in 2 weeks.  Discharge Diagnoses:  Principal Problem:   CAP (community acquired pneumonia) Active Problems:   HYPERLIPIDEMIA   HYPERTENSION   CORONARY ARTERY DISEASE   BOOP (bronchiolitis obliterans with organizing pneumonia)   ZENKER'S DIVERTICULUM   Systemic sclerosis   Diabetes mellitus   Dysphagia, unspecified(787.20)   Stricture and stenosis of esophagus   Discharge Condition: stable  Diet recommendation: dysphagia 3   Filed Weights   06/16/13 2041 06/17/13 2023 06/18/13 2100  Weight: 63.095 kg (139 lb 1.6 oz) 62.914 kg (138 lb 11.2 oz) 60.646 kg (133 lb 11.2 oz)    History of present illness:  79 y.o. female with Past medical history of coronary artery disease, scleroderma, crest syndrome, and strength is diverticulum, GERD, diabetes mellitus, hypertension, CVA, hypothyroidism, cryptogenic organizing pneumonia.  The patient is coming from home.  The patient presents with complaints of cough low-grade fever shortness of breath and headache which has been ongoing since Thanksgiving.  As per the patient she has history of surgery on her esophagus and has a fistula due to which she has once a month episodes of aspiration. Around Thanksgiving she had an episode of aspiration. One week before that she started having complaints of cough and shortness of breath with low-grade fever and the symptom worsened after that episode of aspiration. She has been seen by her PCP who initially placed her on Ceftin and since she continued to have the similar symptoms change the antibiotic to Augmentin and then Ceftin again.  This has not made any improvement in her symptoms and she continues to have shortness of breath on exertion  which is progressively worsening even at rest, it she denies any complaint of chest pain palpitation dizziness lightheadedness nausea or vomiting abdominal pain diarrhea or constipation burning urination leg swelling orthopnea or PND. She also denies any active bleeding or melena   Hospital Course:  Dysphagia, unspecified(787.20) /Aspiration PNA: - barium esophagus showed The patient does have flash laryngeal penetration with thin barium. Distal esophageal stricture with a minimum luminal diameter of 10 mm.  - EGD on 12.24.2014 showed no strictures. balloon dilation perform.  - PPI.  -started empirically on vanc and cefpeime, change antibiotics to Augmentin  Systemic sclerosis  - cont home meds.   HYPERTENSION  - mild high, cont to monitor.   Procedures:  EGD  Barium esophagus  Consultations:  GI  Discharge Exam: Filed Vitals:   06/19/13 1000  BP: 145/53  Pulse: 75  Temp: 98.2 F (36.8 C)  Resp: 18    General: A&O x3 Cardiovascular: RRR Respiratory: good air movement CTA B/L  Discharge Instructions      Discharge Orders   Future Orders Complete By Expires   Diet - low sodium heart healthy  As directed    Increase activity slowly  As directed        Medication List    STOP taking these medications       amoxicillin-clavulanate 125-31.25 MG/5ML suspension  Commonly known as:  AUGMENTIN  Replaced by:  amoxicillin-clavulanate 600-42.9 MG/5ML suspension     cefUROXime 500 MG tablet  Commonly known as:  CEFTIN      TAKE these medications       amLODipine 10 MG tablet  Commonly known as:  NORVASC  Take 10 mg by mouth every morning. Once a day     amoxicillin-clavulanate 600-42.9 MG/5ML suspension  Commonly known as:  AUGMENTIN ES-600  Take 7.3 mLs (875 mg total) by mouth 2 (two) times daily.     aspirin 325 MG tablet  Take 325 mg by mouth 2 (two) times daily.     beta carotene w/minerals tablet  Take 1 tablet by mouth daily.     esomeprazole 40  MG capsule  Commonly known as:  NEXIUM  Take 1 capsule (40 mg total) by mouth daily before breakfast.     imipramine 25 MG tablet  Commonly known as:  TOFRANIL  Take 75 mg by mouth at bedtime.     insulin lispro 100 UNIT/ML injection  Commonly known as:  HUMALOG  Inject 6 Units into the skin 2 (two) times daily. DO NOT SUBSTITUTE PER PATIENT REQUEST     levothyroxine 125 MCG tablet  Commonly known as:  SYNTHROID, LEVOTHROID  Take 125 mcg by mouth every morning.     mometasone 50 MCG/ACT nasal spray  Commonly known as:  NASONEX  Place 2 sprays into the nose daily. As needed for congestion.     predniSONE 5 MG tablet  Commonly known as:  DELTASONE  Take 5 mg by mouth daily.     ticlopidine 250 MG tablet  Commonly known as:  TICLID  Take 1 tablet (250 mg total) by mouth 2 (two) times daily.     triazolam 0.25 MG tablet  Commonly known as:  HALCION  Take 0.25 mg by mouth at bedtime as needed. For sleep       Allergies  Allergen Reactions  . Lantus [Insulin Glargine]     Patient states "sulfate binder causes sinus infection/pneumonia".  . Metoprolol-Hydrochlorothiazide     REACTION: decreased bp  . Novolog [Insulin Aspart]     Patient states "sulfate binder causes sinus infection/pneumonia". Tolerates Humalog.  . Sulfonamide Derivatives Hives  . Titanium     Cervical plate - had to change for stainless steel  . Adhesive [Tape] Hives and Rash   Follow-up Information   Follow up with [facility], MD In 2 weeks. (hospital follow up)    Specialty:  Internal Medicine   Contact information:   [address] Dorothyann [place] [address] [phone]        The results of significant diagnostics from this hospitalization (including imaging, microbiology, ancillary and laboratory) are listed below for reference.    Significant Diagnostic Studies: Dg Chest 2 View  06/15/2013   CLINICAL DATA:  Weakness with short of breath and headaches.  EXAM: CHEST  2  VIEW  COMPARISON:  03/19/2012.  FINDINGS: Patchy airspace disease is present in the right upper lobe compatible with pneumonia. Chronic interstitial prominence. The cardiopericardial silhouette is within normal limits. Surgical clips at the thoracic inlet. Mediastinal contours are within normal limits, unchanged. Aortic arch atherosclerosis. Emphysema is present. Blunting of both costophrenic angles. Coronary artery stent noted.  IMPRESSION: Right upper lobe pneumonia. Followup to ensure radiographic clearing and exclude an underlying lesion is recommended. Typically clearing will be observed at 8 weeks. Underlying emphysema.   Electronically Signed   By: Andreas Newport M.D.   On: 06/15/2013 20:01   Dg Esophagus  06/17/2013   CLINICAL DATA:  Dysphagia. History of Zenker's diverticulum and distal esophageal stricture. Scleroderma.  EXAM: ESOPHOGRAM/BARIUM SWALLOW  TECHNIQUE: Single contrast examination was performed using thin barium and 13 mm barium tablet.  COMPARISON:  Esophagram dated  09/17/2007  FLUOROSCOPY TIME:  1 min 3 seconds  FINDINGS: There is a 2 cm Zenker's diverticulum, unchanged since the prior exam. The patient did have flash laryngeal penetration with thin barium.  The mucosa and motility of the remainder of the esophagus appears normal. There is a distal esophageal stricture. A 13 mm barium tablet did not pass despite repeated swallows of water and thin and thick barium. Estimate the luminal diameter to be approximately 10 mm at the level of the stricture which is at the same location demonstrated on the prior exam but the tablet did not pass on this exam.  There is a small sliding hiatal hernia, unchanged.  IMPRESSION: 1. The patient does have flash laryngeal penetration with thin barium. 2. Distal esophageal stricture with a minimum luminal diameter of 10 mm. 3. Small Zenker's diverticulum, unchanged. 4. Small hiatal hernia, unchanged.   Electronically Signed   By: Geanie Cooley M.D.   On:  06/17/2013 16:10    Microbiology: Recent Results (from the past 240 hour(s))  CULTURE, BLOOD (ROUTINE X 2)     Status: None   Collection Time    06/15/13 10:05 PM      Result Value Range Status   Specimen Description BLOOD RIGHT HAND   Final   Special Requests BOTTLES DRAWN AEROBIC AND ANAEROBIC 10CC EACH   Final   Culture  Setup Time     Final   Value: 06/16/2013 09:21     Performed at [facility]   Culture     Final   Value:        BLOOD CULTURE RECEIVED NO GROWTH TO DATE CULTURE WILL BE HELD FOR 5 DAYS BEFORE ISSUING A FINAL NEGATIVE REPORT     Performed at [facility]   Report Status PENDING   Incomplete  CULTURE, BLOOD (ROUTINE X 2)     Status: None   Collection Time    06/16/13  1:15 AM      Result Value Range Status   Specimen Description BLOOD RIGHT HAND   Final   Special Requests BOTTLES DRAWN AEROBIC ONLY 1CC   Final   Culture  Setup Time     Final   Value: 06/16/2013 09:21     Performed at [facility]   Culture     Final   Value:        BLOOD CULTURE RECEIVED NO GROWTH TO DATE CULTURE WILL BE HELD FOR 5 DAYS BEFORE ISSUING A FINAL NEGATIVE REPORT     Performed at [facility]   Report Status PENDING   Incomplete     Labs: Basic Metabolic Panel:  Recent Labs Lab 06/15/13 1840 06/16/13 0606 06/17/13 0445 06/18/13 0350  NA 133* 134* 136 138  K 3.2* 4.1 3.9 3.5  CL 94* 98 100 99  CO2 28 25 25 28   GLUCOSE 119* 174* 231* 129*  BUN 10 10 16 17   CREATININE 0.70 0.64 0.72 0.78  CALCIUM 9.0 8.6 8.5 8.4   Liver Function Tests:  Recent Labs Lab 06/16/13 0606 06/17/13 0445  AST 13 27  ALT 14 29  ALKPHOS 70 72  BILITOT 0.2* 0.1*  PROT 6.7 6.4  ALBUMIN 3.3* 3.0*   No results found for this basename: LIPASE, AMYLASE,  in the last 168 hours No results found for this basename: AMMONIA,  in the last 168 hours CBC:  Recent Labs Lab 06/15/13 1840 06/16/13 0606 06/17/13 0445 06/18/13 0350  WBC 11.1* 8.7 13.6* 10.8*  NEUTROABS  --  7.8*  --   --   HGB 12.0 11.1* 10.3* 10.6*  HCT 36.0 34.1* 31.8* 32.5*  MCV 84.7 84.0 84.1 84.6  PLT 261 237 288 280   Cardiac Enzymes: No results found for this basename: CKTOTAL, CKMB, CKMBINDEX, TROPONINI,  in the last 168 hours BNP: BNP (last 3 results)  Recent Labs  06/15/13 1840  PROBNP 190.2   CBG:  Recent Labs Lab 06/18/13 0746 06/18/13 1230 06/18/13 1610 06/18/13 2154 06/19/13 0750  GLUCAP 111* 79 186* 124* 91       Signed:  FELIZ ORTIZ, ABRAHAM  [facility] 06/19/2013, 10:36 AM

## 2013-06-20 NOTE — Progress Notes (Signed)
No NCM needs identified. Crescent Gotham RN CCM Case Mgmt phone [phone] 

## 2013-06-22 LAB — CULTURE, BLOOD (ROUTINE X 2)
Culture: NO GROWTH
Culture: NO GROWTH

## 2013-06-25 DIAGNOSIS — L940 Localized scleroderma [morphea]: Secondary | ICD-10-CM | POA: Diagnosis not present

## 2013-07-16 ENCOUNTER — Emergency Department (HOSPITAL_COMMUNITY): Payer: Medicare Other

## 2013-07-16 ENCOUNTER — Inpatient Hospital Stay (HOSPITAL_COMMUNITY)
Admission: EM | Admit: 2013-07-16 | Discharge: 2013-07-19 | DRG: 195 | Disposition: A | Discharge status: Home / Self Care | Payer: Medicare Other | Attending: Internal Medicine | Admitting: Internal Medicine

## 2013-07-16 ENCOUNTER — Encounter (HOSPITAL_COMMUNITY): Payer: Self-pay | Admitting: Emergency Medicine

## 2013-07-16 DIAGNOSIS — Z981 Arthrodesis status: Secondary | ICD-10-CM

## 2013-07-16 DIAGNOSIS — Z8701 Personal history of pneumonia (recurrent): Secondary | ICD-10-CM

## 2013-07-16 DIAGNOSIS — K56699 Other intestinal obstruction unspecified as to partial versus complete obstruction: Secondary | ICD-10-CM

## 2013-07-16 DIAGNOSIS — F29 Unspecified psychosis not due to a substance or known physiological condition: Secondary | ICD-10-CM | POA: Diagnosis not present

## 2013-07-16 DIAGNOSIS — I252 Old myocardial infarction: Secondary | ICD-10-CM | POA: Diagnosis not present

## 2013-07-16 DIAGNOSIS — K219 Gastro-esophageal reflux disease without esophagitis: Secondary | ICD-10-CM | POA: Diagnosis present

## 2013-07-16 DIAGNOSIS — Z794 Long term (current) use of insulin: Secondary | ICD-10-CM | POA: Diagnosis not present

## 2013-07-16 DIAGNOSIS — J189 Pneumonia, unspecified organism: Principal | ICD-10-CM | POA: Diagnosis present

## 2013-07-16 DIAGNOSIS — E039 Hypothyroidism, unspecified: Secondary | ICD-10-CM | POA: Diagnosis present

## 2013-07-16 DIAGNOSIS — E7800 Pure hypercholesterolemia, unspecified: Secondary | ICD-10-CM | POA: Diagnosis present

## 2013-07-16 DIAGNOSIS — IMO0001 Reserved for inherently not codable concepts without codable children: Secondary | ICD-10-CM | POA: Diagnosis present

## 2013-07-16 DIAGNOSIS — K222 Esophageal obstruction: Secondary | ICD-10-CM | POA: Diagnosis present

## 2013-07-16 DIAGNOSIS — I10 Essential (primary) hypertension: Secondary | ICD-10-CM | POA: Diagnosis present

## 2013-07-16 DIAGNOSIS — E119 Type 2 diabetes mellitus without complications: Secondary | ICD-10-CM | POA: Diagnosis present

## 2013-07-16 DIAGNOSIS — M129 Arthropathy, unspecified: Secondary | ICD-10-CM | POA: Diagnosis present

## 2013-07-16 DIAGNOSIS — H3530 Unspecified macular degeneration: Secondary | ICD-10-CM | POA: Diagnosis present

## 2013-07-16 DIAGNOSIS — I2789 Other specified pulmonary heart diseases: Secondary | ICD-10-CM | POA: Diagnosis present

## 2013-07-16 DIAGNOSIS — J8409 Other alveolar and parieto-alveolar conditions: Secondary | ICD-10-CM | POA: Diagnosis not present

## 2013-07-16 DIAGNOSIS — J310 Chronic rhinitis: Secondary | ICD-10-CM | POA: Diagnosis not present

## 2013-07-16 DIAGNOSIS — K589 Irritable bowel syndrome without diarrhea: Secondary | ICD-10-CM | POA: Diagnosis present

## 2013-07-16 DIAGNOSIS — K225 Diverticulum of esophagus, acquired: Secondary | ICD-10-CM | POA: Diagnosis present

## 2013-07-16 DIAGNOSIS — G4730 Sleep apnea, unspecified: Secondary | ICD-10-CM | POA: Diagnosis present

## 2013-07-16 DIAGNOSIS — Z87891 Personal history of nicotine dependence: Secondary | ICD-10-CM | POA: Diagnosis not present

## 2013-07-16 DIAGNOSIS — IMO0002 Reserved for concepts with insufficient information to code with codable children: Secondary | ICD-10-CM

## 2013-07-16 DIAGNOSIS — Z8249 Family history of ischemic heart disease and other diseases of the circulatory system: Secondary | ICD-10-CM

## 2013-07-16 DIAGNOSIS — E785 Hyperlipidemia, unspecified: Secondary | ICD-10-CM | POA: Diagnosis present

## 2013-07-16 DIAGNOSIS — H548 Legal blindness, as defined in USA: Secondary | ICD-10-CM | POA: Diagnosis present

## 2013-07-16 DIAGNOSIS — D638 Anemia in other chronic diseases classified elsewhere: Secondary | ICD-10-CM

## 2013-07-16 DIAGNOSIS — Z8673 Personal history of transient ischemic attack (TIA), and cerebral infarction without residual deficits: Secondary | ICD-10-CM

## 2013-07-16 DIAGNOSIS — F4489 Other dissociative and conversion disorders: Secondary | ICD-10-CM

## 2013-07-16 DIAGNOSIS — R0602 Shortness of breath: Secondary | ICD-10-CM | POA: Diagnosis not present

## 2013-07-16 DIAGNOSIS — R1310 Dysphagia, unspecified: Secondary | ICD-10-CM | POA: Diagnosis present

## 2013-07-16 DIAGNOSIS — R05 Cough: Secondary | ICD-10-CM | POA: Diagnosis not present

## 2013-07-16 DIAGNOSIS — I2510 Atherosclerotic heart disease of native coronary artery without angina pectoris: Secondary | ICD-10-CM | POA: Diagnosis present

## 2013-07-16 DIAGNOSIS — Z823 Family history of stroke: Secondary | ICD-10-CM | POA: Diagnosis not present

## 2013-07-16 DIAGNOSIS — J8489 Other specified interstitial pulmonary diseases: Secondary | ICD-10-CM

## 2013-07-16 DIAGNOSIS — M797 Fibromyalgia: Secondary | ICD-10-CM

## 2013-07-16 DIAGNOSIS — M349 Systemic sclerosis, unspecified: Secondary | ICD-10-CM

## 2013-07-16 DIAGNOSIS — Z23 Encounter for immunization: Secondary | ICD-10-CM

## 2013-07-16 DIAGNOSIS — R059 Cough, unspecified: Secondary | ICD-10-CM | POA: Diagnosis not present

## 2013-07-16 HISTORY — DX: Pneumonia, unspecified organism: J18.9

## 2013-07-16 LAB — CBC
HCT: 33.2 % — ABNORMAL LOW (ref 36.0–46.0)
Hemoglobin: 10.9 g/dL — ABNORMAL LOW (ref 12.0–15.0)
MCH: 27.3 pg (ref 26.0–34.0)
MCHC: 32.8 g/dL (ref 30.0–36.0)
MCV: 83.2 fL (ref 78.0–100.0)
Platelets: 197 10*3/uL (ref 150–400)
RBC: 3.99 MIL/uL (ref 3.87–5.11)
RDW: 13.9 % (ref 11.5–15.5)
WBC: 8.5 10*3/uL (ref 4.0–10.5)

## 2013-07-16 LAB — COMPREHENSIVE METABOLIC PANEL
ALT: 15 U/L (ref 0–35)
AST: 17 U/L (ref 0–37)
Albumin: 3.6 g/dL (ref 3.5–5.2)
Alkaline Phosphatase: 84 U/L (ref 39–117)
BUN: 12 mg/dL (ref 6–23)
CO2: 25 mEq/L (ref 19–32)
Calcium: 8.7 mg/dL (ref 8.4–10.5)
Chloride: 91 mEq/L — ABNORMAL LOW (ref 96–112)
Creatinine, Ser: 0.68 mg/dL (ref 0.50–1.10)
GFR calc Af Amer: >90 mL/min (ref >90)
GFR calc non Af Amer: 80 mL/min — ABNORMAL LOW (ref >90)
Glucose, Bld: 195 mg/dL — ABNORMAL HIGH (ref 70–99)
Potassium: 4.4 mEq/L (ref 3.7–5.3)
Sodium: 132 mEq/L — ABNORMAL LOW (ref 137–147)
Total Bilirubin: <0.2 mg/dL — ABNORMAL LOW (ref 0.3–1.2)
Total Protein: 7.2 g/dL (ref 6.0–8.3)

## 2013-07-16 LAB — CBC WITH DIFFERENTIAL/PLATELET
Basophils Absolute: 0 10*3/uL (ref 0.0–0.1)
Basophils Relative: 0 % (ref 0–1)
Eosinophils Absolute: 0.1 10*3/uL (ref 0.0–0.7)
Eosinophils Relative: 1 % (ref 0–5)
HCT: 35.3 % — ABNORMAL LOW (ref 36.0–46.0)
Hemoglobin: 11.7 g/dL — ABNORMAL LOW (ref 12.0–15.0)
Lymphocytes Relative: 11 % — ABNORMAL LOW (ref 12–46)
Lymphs Abs: 1 10*3/uL (ref 0.7–4.0)
MCH: 27.8 pg (ref 26.0–34.0)
MCHC: 33.1 g/dL (ref 30.0–36.0)
MCV: 83.8 fL (ref 78.0–100.0)
Monocytes Absolute: 0.7 10*3/uL (ref 0.1–1.0)
Monocytes Relative: 7 % (ref 3–12)
Neutro Abs: 7.3 10*3/uL (ref 1.7–7.7)
Neutrophils Relative %: 80 % — ABNORMAL HIGH (ref 43–77)
Platelets: 211 10*3/uL (ref 150–400)
RBC: 4.21 MIL/uL (ref 3.87–5.11)
RDW: 14 % (ref 11.5–15.5)
WBC: 9.1 10*3/uL (ref 4.0–10.5)

## 2013-07-16 LAB — CREATININE, SERUM
Creatinine, Ser: 0.7 mg/dL (ref 0.50–1.10)
GFR calc Af Amer: >90 mL/min (ref >90)
GFR calc non Af Amer: 80 mL/min — ABNORMAL LOW (ref >90)

## 2013-07-16 MED ORDER — LEVOTHYROXINE SODIUM 125 MCG PO TABS
125.0000 ug | ORAL_TABLET | Freq: Every morning | ORAL | Status: DC
  Administered 2013-07-17 – 2013-07-19 (×3): 125 ug via ORAL
  Filled 2013-07-16 (×3): qty 1

## 2013-07-16 MED ORDER — METHYLPREDNISOLONE SODIUM SUCC 125 MG IJ SOLR
125.0000 mg | Freq: Three times a day (TID) | INTRAMUSCULAR | Status: DC
  Administered 2013-07-16 – 2013-07-17 (×2): 125 mg via INTRAVENOUS
  Filled 2013-07-16 (×5): qty 2

## 2013-07-16 MED ORDER — VANCOMYCIN HCL IN DEXTROSE 1-5 GM/200ML-% IV SOLN
1000.0000 mg | Freq: Once | INTRAVENOUS | Status: DC
  Filled 2013-07-16: qty 200

## 2013-07-16 MED ORDER — VANCOMYCIN HCL 500 MG IV SOLR
500.0000 mg | Freq: Two times a day (BID) | INTRAVENOUS | Status: DC
  Administered 2013-07-17 – 2013-07-18 (×3): 500 mg via INTRAVENOUS
  Filled 2013-07-16 (×4): qty 500

## 2013-07-16 MED ORDER — PIPERACILLIN-TAZOBACTAM 3.375 G IVPB 30 MIN: 3.3750 g | Freq: Once | INTRAVENOUS | Status: DC

## 2013-07-16 MED ORDER — PANTOPRAZOLE SODIUM 40 MG PO TBEC
80.0000 mg | DELAYED_RELEASE_TABLET | Freq: Every day | ORAL | Status: DC
  Administered 2013-07-17: 80 mg via ORAL
  Filled 2013-07-16: qty 2

## 2013-07-16 MED ORDER — TICLOPIDINE HCL 250 MG PO TABS
250.0000 mg | ORAL_TABLET | Freq: Two times a day (BID) | ORAL | Status: DC
  Administered 2013-07-16 – 2013-07-19 (×6): 250 mg via ORAL
  Filled 2013-07-16 (×7): qty 1

## 2013-07-16 MED ORDER — OCUVITE PO TABS
1.0000 | ORAL_TABLET | Freq: Every day | ORAL | Status: DC
  Administered 2013-07-17 – 2013-07-19 (×3): 1 via ORAL
  Filled 2013-07-16 (×3): qty 1

## 2013-07-16 MED ORDER — PIPERACILLIN-TAZOBACTAM 3.375 G IVPB: 3.3750 g | Freq: Three times a day (TID) | INTRAVENOUS | Status: DC

## 2013-07-16 MED ORDER — FLUTICASONE PROPIONATE 50 MCG/ACT NA SUSP
2.0000 | Freq: Every day | NASAL | Status: DC
  Administered 2013-07-16 – 2013-07-19 (×4): 2 via NASAL
  Filled 2013-07-16: qty 16

## 2013-07-16 MED ORDER — HEPARIN SODIUM (PORCINE) 5000 UNIT/ML IJ SOLN
5000.0000 [IU] | Freq: Three times a day (TID) | INTRAMUSCULAR | Status: DC
  Administered 2013-07-16 – 2013-07-19 (×8): 5000 [IU] via SUBCUTANEOUS
  Filled 2013-07-16 (×11): qty 1

## 2013-07-16 MED ORDER — INSULIN LISPRO 100 UNIT/ML ~~LOC~~ SOLN: 4.0000 [IU] | Freq: Two times a day (BID) | SUBCUTANEOUS | Status: DC

## 2013-07-16 MED ORDER — AMLODIPINE BESYLATE 10 MG PO TABS
10.0000 mg | ORAL_TABLET | Freq: Every morning | ORAL | Status: DC
  Administered 2013-07-17 – 2013-07-19 (×3): 10 mg via ORAL
  Filled 2013-07-16 (×3): qty 1

## 2013-07-16 MED ORDER — RESOURCE THICKENUP CLEAR PO POWD
ORAL | Status: DC | PRN
  Filled 2013-07-16: qty 125

## 2013-07-16 MED ORDER — NON FORMULARY: 4.0000 [IU] | Freq: Two times a day (BID) | Status: DC

## 2013-07-16 MED ORDER — ASPIRIN 325 MG PO TABS
325.0000 mg | ORAL_TABLET | Freq: Two times a day (BID) | ORAL | Status: DC
  Administered 2013-07-16 – 2013-07-19 (×6): 325 mg via ORAL
  Filled 2013-07-16 (×7): qty 1

## 2013-07-16 MED ORDER — DEXTROSE 5 % IV SOLN
1.0000 g | INTRAVENOUS | Status: DC
  Administered 2013-07-16 – 2013-07-17 (×2): 1 g via INTRAVENOUS
  Filled 2013-07-16 (×4): qty 1

## 2013-07-16 MED ORDER — IMIPRAMINE HCL 50 MG PO TABS
75.0000 mg | ORAL_TABLET | Freq: Every day | ORAL | Status: DC
  Administered 2013-07-16 – 2013-07-18 (×3): 75 mg via ORAL
  Filled 2013-07-16 (×4): qty 1

## 2013-07-16 MED ORDER — TRIAZOLAM 0.25 MG PO TABS
0.2500 mg | ORAL_TABLET | Freq: Every evening | ORAL | Status: DC | PRN
  Administered 2013-07-17 (×2): 0.25 mg via ORAL
  Filled 2013-07-16 (×2): qty 1

## 2013-07-16 NOTE — H&P (Addendum)
Hospitalist Admission History and Physical  Patient name: [name] Medical record number: [id number] Date of birth: 12-28-1930 Age: 80 y.o. Gender: female  Primary Care Provider: Merrilee Seashore, MD  Chief Complaint: HCAP  History of Present Illness:This is a 80 y.o. year old female with prior hx/p COPD, scleroderma-on chronic prednisone, BOOP, TE fistula w/ zenkers divericulim and distal esophagheal stricture s/p dilation by GI, recent admission for CAP presenting with HCAP/non resolution of previous PNA. Pt noted to have been admitted over [place] with CAP 12/21-12/24 (esophageal dilatation during this admission). Pt was initially placed on vanc and cefepime, then transitioned to augmentin. Was discharged on extended course of augmentin. Pt states that she took augmentin until last week Saturday. Pt states that she had recurrence of cough, SOB day after augmentin was completed. Pt has had progressive cough and SOB since this point. Has also had some rhinorrhea and nasal congestion. Mild subjective chills at home. Also with ? Recurrent aspiration events per daughter. Tmax 102 at home today. No nausea or vomiting. Mild wheezing at home in setting of COPD.    IN the ER, pt satting in low 90s on RA. Tmax 100.3. CXR showed persistence of RUL PNA in comparison to 12/21 CXR. Pt initially started on vanc and zosyn in ER.    Patient Active Problem List   Diagnosis Date Noted  . HCAP (healthcare-associated pneumonia) 07/16/2013  . Dysphagia, unspecified(787.20) 06/18/2013  . Stricture and stenosis of esophagus 06/18/2013  . Pneumonia 06/15/2013  . CAP (community acquired pneumonia) 06/15/2013  . Urinary incontinence in female 05/06/2012  . Fibromyalgia   . Chronic confusional state 03/21/2012  . Postoperative anemia 03/21/2012  . Anemia of chronic disease 03/19/2012  . Postinflammatory pulmonary fibrosis 03/19/2012  . Diabetes mellitus 05/30/2011  . Colonic stricture s/p lap sigmoid  colectomy 03/18/2012 03/27/2011  . Diverticulosis of colon 03/27/2011  . Systemic sclerosis 06/29/2009  . Pure hypercholesterolemia 05/25/2009  . BOOP (bronchiolitis obliterans with organizing pneumonia) 02/18/2009  . Chronic rhinitis 07/08/2007  . EMPHYSEMA 07/08/2007  . HYPERLIPIDEMIA 06/26/2007  . DEPRESSION, CHRONIC 06/26/2007  . Legal blindness 06/26/2007  . HYPERTENSION 06/26/2007  . CORONARY ARTERY DISEASE 06/26/2007  . ZENKER'S DIVERTICULUM 06/26/2007  . Irritable bowel syndrome 06/26/2007   Past Medical History: Past Medical History  Diagnosis Date  . Coronary artery disease   . Dressler's syndrome   . Hypertension   . Dyslipidemia   . Diabetes mellitus   . CVA (cerebral infarction) 1991  . Macular degeneration   . GERD (gastroesophageal reflux disease)   . Diverticulosis   . IBS (irritable bowel syndrome)   . Zenker's diverticulum   . Emphysema   . Pulmonary hypertension   . Chronic sinusitis   . Fibromyalgia   . Hypothyroidism   . Sjogren's disease   . Raynaud's phenomenon   . Cervical disc disease   . Adenomatous colon polyp   . Scleroderma   . COPD (chronic obstructive pulmonary disease)   . Arthritis   . Blood transfusion   . Myocardial infarction may 27th 2007  . Pneumonia      HX PNEUMONIA  . Depression   . 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 Cancer     cervical cancer pre hysterectomy  . Sleep apnea     STOP BANG SCORE 5  . Legally blind   . CONSTIPATION, CHRONIC 06/26/2007    Qualifier: Diagnosis of  By: Nils Pyle CMA ([place]), Mearl Latin    .  Pneumonia     Past Surgical History: Past Surgical History  Procedure Laterality Date  . Total abdominal hysterectomy  1973  . Bilateral oophorectomy  2002  . Spinal fusion  1997    and implantation of a plate   . Lobe thyroid removal    . Thyroid lobectomy  1991    removal of left lobe thyroid  . Cholecystectomy  1965  . Appendectomy    . Cataract extraction    . Bronchoscopy   02-22-09  . Stented cardiac  artery  2007 / 2007 / 2010    X 3 STENT  . Proctoscopy  03/18/2012    Procedure: PROCTOSCOPY;  Surgeon: Adin Hector, MD;  Location: [facility];  Service: General;  Laterality: N/A;  rigid procto  . Colon surgery  2014    colon resection  . Esophagogastroduodenoscopy (egd) with esophageal dilation N/A 06/18/2013    Procedure: ESOPHAGOGASTRODUODENOSCOPY (EGD) WITH ESOPHAGEAL DILATION;  Surgeon: Inda Castle, MD;  Location: [facility];  Service: Endoscopy;  Laterality: N/A;    Social History: History   Social History  . Marital Status: Divorced    Spouse Name: N/A    Number of Children: N/A  . Years of Education: N/A   Occupational History  . retired    Social History Main Topics  . Smoking status: Former Smoker -- 1.00 packs/day for 40 years    Types: Cigarettes    Quit date: 06/26/1988  . Smokeless tobacco: Never Used     Comment: 40 pack years  . Alcohol Use: No  . Drug Use: No  . Sexual Activity: None   Other Topics Concern  . None   Social History Narrative  . None    Family History: Family History  Problem Relation Age of Onset  . Other Mother 48    Died from sepsis  . Stroke Father 69    died  . Heart disease Father   . Cancer Sister     lung  . Cancer Brother     lung    Allergies: Allergies  Allergen Reactions  . Lantus [Insulin Glargine]     Patient states "sulfate binder causes sinus infection/pneumonia".  . Metoprolol-Hydrochlorothiazide     REACTION: decreased bp  . Novolog [Insulin Aspart]     Patient states "sulfate binder causes sinus infection/pneumonia". Tolerates Humalog.  . Sulfonamide Derivatives Hives  . Titanium     Cervical plate - had to change for stainless steel  . Adhesive [Tape] Hives and Rash    Current Facility-Administered Medications  Medication Dose Route Frequency Provider Last Rate Last Dose  . [START ON 07/17/2013] amLODipine (NORVASC) tablet 10 mg  10 mg Oral q morning - 10a Shanda Howells, MD      . aspirin tablet 325 mg  325 mg Oral BID Shanda Howells, MD      . beta carotene w/minerals (OCUVITE) tablet 1 tablet  1 tablet Oral Daily Shanda Howells, MD      . ceFEPIme (MAXIPIME) 1 g in dextrose 5 % 50 mL IVPB  1 g Intravenous Q8H Shanda Howells, MD      . fluticasone [facility]) 50 MCG/ACT nasal spray 2 spray  2 spray Each Nare Daily Shanda Howells, MD      . heparin injection 5,000 Units  5,000 Units Subcutaneous Q8H Shanda Howells, MD      . imipramine (TOFRANIL) tablet 75 mg  75 mg Oral QHS Shanda Howells, MD      . insulin  lispro (HUMALOG) injection 4 Units  4 Units Subcutaneous BID Shanda Howells, MD      . Derrill Memo ON 07/17/2013] levothyroxine (SYNTHROID, LEVOTHROID) tablet 125 mcg  125 mcg Oral q morning - 10a Shanda Howells, MD      . Derrill Memo ON 07/17/2013] pantoprazole (PROTONIX) EC tablet 80 mg  80 mg Oral Q1200 Shanda Howells, MD      . piperacillin-tazobactam (ZOSYN) IVPB 3.375 g  3.375 g Intravenous Once [employer], [facility]      . [START ON 07/17/2013] piperacillin-tazobactam (ZOSYN) IVPB 3.375 g  3.375 g Intravenous Q8H Rande Lawman [employer], [facility]      . ticlopidine (TICLID) tablet 250 mg  250 mg Oral BID Shanda Howells, MD      . triazolam (HALCION) tablet 0.25 mg  0.25 mg Oral QHS PRN Shanda Howells, MD      . Derrill Memo ON 07/17/2013] vancomycin (VANCOCIN) 500 mg in sodium chloride 0.9 % 100 mL IVPB  500 mg Intravenous Q12H Rande Lawman [employer], [facility]      . vancomycin (VANCOCIN) IVPB 1000 mg/200 mL premix  1,000 mg Intravenous Once Rande Lawman [employer], [facility]       Current Outpatient Prescriptions  Medication Sig Dispense Refill  . amLODipine (NORVASC) 10 MG tablet Take 10 mg by mouth every morning. Once a day      . amoxicillin-clavulanate (AUGMENTIN ES-600) 600-42.9 MG/5ML suspension Take 7.3 mLs (875 mg total) by mouth 2 (two) times daily.  240 mL  0  . aspirin 325 MG tablet Take 325 mg by mouth 2 (two) times daily.       . beta carotene w/minerals (OCUVITE) tablet Take  1 tablet by mouth daily.      [name] esomeprazole (NEXIUM) 40 MG capsule Take 1 capsule (40 mg total) by mouth daily before breakfast.  30 capsule  6  . imipramine (TOFRANIL) 25 MG tablet Take 75 mg by mouth at bedtime.       . insulin lispro (HUMALOG) 100 UNIT/ML injection Inject 6 Units into the skin 2 (two) times daily. DO NOT SUBSTITUTE PER PATIENT REQUEST      . levothyroxine (SYNTHROID, LEVOTHROID) 125 MCG tablet Take 125 mcg by mouth every morning.       . mometasone (NASONEX) 50 MCG/ACT nasal spray Place 2 sprays into the nose daily. As needed for congestion.      . predniSONE (DELTASONE) 5 MG tablet Take 5 mg by mouth daily.      . ticlopidine (TICLID) 250 MG tablet Take 1 tablet (250 mg total) by mouth 2 (two) times daily.  60 tablet  5  . triazolam (HALCION) 0.25 MG tablet Take 0.25 mg by mouth at bedtime as needed. For sleep       Review Of Systems: 12 point ROS negative except as noted above in HPI.  Physical Exam: Filed Vitals:   07/16/13 2000  BP: 146/53  Pulse: 92  Temp:   Resp:     General: alert and cooperative HEENT: PERRLA and extra ocular movement intact, +nasal erythema, rhinorrhea bilaterally, + post oropharyngeal erythema  Heart: S1, S2 normal, no murmur, rub or gallop, regular rate and rhythm Lungs: clear to auscultation and unlabored breathing Abdomen: abdomen is soft without significant tenderness, masses, organomegaly or guarding Extremities: extremities normal, atraumatic, no cyanosis or edema Skin:no rashes, no ecchymoses Neurology: normal without focal findings  Labs and Imaging: Lab Results  Component Value Date/Time   NA 132* 07/16/2013  6:00 PM   K 4.4 07/16/2013  6:00 PM   CL 91* 07/16/2013  6:00 PM   CO2 25 07/16/2013  6:00 PM   BUN 12 07/16/2013  6:00 PM   CREATININE 0.68 07/16/2013  6:00 PM   CREATININE 0.79 04/22/2012  1:51 PM   GLUCOSE 195* 07/16/2013  6:00 PM   Lab Results  Component Value Date   WBC 9.1 07/16/2013   HGB 11.7* 07/16/2013   HCT  35.3* 07/16/2013   MCV 83.8 07/16/2013   PLT 211 07/16/2013    Dg Chest 2 View  07/16/2013   CLINICAL DATA:  Cough  EXAM: CHEST  2 VIEW  COMPARISON:  DG CHEST 2 VIEW dated 06/15/2013  FINDINGS: Heterogeneous opacities in the right upper lobe are not significantly changed. Lungs are otherwise clear. Normal heart size. Hyperaeration.  IMPRESSION: Heterogeneous opacities in the right upper lobe are not significantly changed. Underlying mass is not excluded.   Electronically Signed   By: Maryclare Bean M.D.   On: 07/16/2013 19:23     Assessment and Plan: FERNE LEVERTON is a 80 y.o. year old female presenting with HCAP  HCAP: Will place on HCAP treat with vanc and cefepime. Sputum cx. Urine strep and legionella. Also place on stress dose solumedrol given mild wheezing in setting of emphysema. Pulm consult in am. I/S. ? Recurrent aspiration may be contributing issues. ? GI reconsult. Defer to am team.   Zenkers/ esophageal stricture: thickened liquids. Speech consult. Pt denies any active episodes of aspiration.   CAD/CVA: Clinically stable. Continue home meds. Continue ticlid   DM: SSI.   FEN/GI: Dysphagia 3 diet. Speech reconsult. PPI.  ? If PEG tube may be a consideration if recurrent aspiration is responsible for pulmonary issues. Defer to am team and GI.   Prophylaxis: subqheparin.  Disposition: pending further evaluation  Code Status:Full Code.        Shanda Howells MD  Pager: [phone]

## 2013-07-16 NOTE — Progress Notes (Signed)
Received report from Brittany, RN.

## 2013-07-16 NOTE — Progress Notes (Signed)
ANTIBIOTIC CONSULT NOTE - INITIAL  Pharmacy Consult for vancomycin + zosyn Indication: HCAP  Allergies  Allergen Reactions  . Lantus [Insulin Glargine]     Patient states "sulfate binder causes sinus infection/pneumonia".  . Metoprolol-Hydrochlorothiazide     REACTION: decreased bp  . Novolog [Insulin Aspart]     Patient states "sulfate binder causes sinus infection/pneumonia". Tolerates Humalog.  . Sulfonamide Derivatives Hives  . Titanium     Cervical plate - had to change for stainless steel  . Adhesive [Tape] Hives and Rash    Patient Measurements:   Adjusted Body Weight:   Vital Signs: Temp: 100.3 F (37.9 C) (01/21 1743) Temp src: Oral (01/21 1743) BP: 146/53 mmHg (01/21 2000) Pulse Rate: 92 (01/21 2000) Intake/Output from previous day:   Intake/Output from this shift:    Labs:  Recent Labs  07/16/13 1800  WBC 9.1  HGB 11.7*  PLT 211  CREATININE 0.68   The CrCl is unknown because both a height and weight (above a minimum accepted value) are required for this calculation. No results found for this basename: VANCOTROUGH, VANCOPEAK, VANCORANDOM, GENTTROUGH, GENTPEAK, GENTRANDOM, TOBRATROUGH, TOBRAPEAK, TOBRARND, AMIKACINPEAK, AMIKACINTROU, AMIKACIN,  in the last 72 hours   Microbiology: No results found for this or any previous visit (from the past 720 hour(s)).  Medical History: Past Medical History  Diagnosis Date  . Coronary artery disease   . Dressler's syndrome   . Hypertension   . Dyslipidemia   . Diabetes mellitus   . CVA (cerebral infarction) 1991  . Macular degeneration   . GERD (gastroesophageal reflux disease)   . Diverticulosis   . IBS (irritable bowel syndrome)   . Zenker's diverticulum   . Emphysema   . Pulmonary hypertension   . Chronic sinusitis   . Fibromyalgia   . Hypothyroidism   . Sjogren's disease   . Raynaud's phenomenon   . Cervical disc disease   . Adenomatous colon polyp   . Scleroderma   . COPD (chronic obstructive  pulmonary disease)   . Arthritis   . Blood transfusion   . Myocardial infarction may 27th 2007  . Pneumonia      HX PNEUMONIA  . Depression   . 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 Cancer     cervical cancer pre hysterectomy  . Sleep apnea     STOP BANG SCORE 5  . Legally blind   . CONSTIPATION, CHRONIC 06/26/2007    Qualifier: Diagnosis of  By: Nils Pyle CMA ([place]), Mearl Latin    . Pneumonia     Medications:  Anti-infectives   Start     Dose/Rate Route Frequency Ordered Stop   07/17/13 1000  vancomycin (VANCOCIN) 500 mg in sodium chloride 0.9 % 100 mL IVPB     500 mg 100 mL/hr over 60 Minutes Intravenous Every 12 hours 07/16/13 2013     07/17/13 0300  piperacillin-tazobactam (ZOSYN) IVPB 3.375 g     3.375 g 12.5 mL/hr over 240 Minutes Intravenous Every 8 hours 07/16/13 2013     07/16/13 2000  vancomycin (VANCOCIN) IVPB 1000 mg/200 mL premix     1,000 mg 200 mL/hr over 60 Minutes Intravenous  Once 07/16/13 1952     07/16/13 2000  piperacillin-tazobactam (ZOSYN) IVPB 3.375 g     3.375 g 100 mL/hr over 30 Minutes Intravenous  Once 07/16/13 1952       Assessment: 52 yof presented to the ED with flu symptoms. To start empiric vancomycin +  zosyn for HCAP. Tmax is 100.3 and WBC is WNL. Scr is WNL. She was on augmentin PTA.  Vanc 1/21>> Zosyn 1/21>>  Goal of Therapy:  Vancomycin trough level 15-20 mcg/ml  Plan:  1. Vancomycin 1gm IV x 1 then 500mg  IV Q12H 2. Zosyn 3.375gm IV Q8H (4 hr inf) 3. F/u renal fxn, C&S, clinical status and trough at [facility], Rande Lawman 07/16/2013,8:15 PM

## 2013-07-16 NOTE — ED Notes (Signed)
Pt c/o cough, congestion, elevated temp and shortness of breath.  Onset 4 days ago.  Pt st's she had pneumonia in Dec.  Pt also c/o aching in arms

## 2013-07-16 NOTE — Progress Notes (Signed)
Pt placed on droplet precautions in ED. No found flu PCR pending. RN will put in order for PCR.

## 2013-07-16 NOTE — ED Provider Notes (Signed)
CSN: [id number]     Arrival date & time 07/16/13  1715 History   First MD Initiated Contact with Patient 07/16/13 1832     Chief Complaint  Patient presents with  . Influenza   (Consider location/radiation/quality/duration/timing/severity/associated sxs/prior Treatment) Patient is a 80 y.o. female presenting with flu symptoms.  Influenza  Pt with history of multiple medical problems including recurrent RUL pneumonia treated for same in December, discharged about a month ago states she never got better, but has been significantly worse in the last 4 days with worsening cough, productive of sputum, associated with fever to 101F at home. No CP, states arms and head ache with coughing.   Past Medical History  Diagnosis Date  . Coronary artery disease   . Dressler's syndrome   . Hypertension   . Dyslipidemia   . Diabetes mellitus   . CVA (cerebral infarction) 1991  . Macular degeneration   . GERD (gastroesophageal reflux disease)   . Diverticulosis   . IBS (irritable bowel syndrome)   . Zenker's diverticulum   . Emphysema   . Pulmonary hypertension   . Chronic sinusitis   . Fibromyalgia   . Hypothyroidism   . Sjogren's disease   . Raynaud's phenomenon   . Cervical disc disease   . Adenomatous colon polyp   . Scleroderma   . COPD (chronic obstructive pulmonary disease)   . Arthritis   . Blood transfusion   . Myocardial infarction may 27th 2007  . Pneumonia      HX PNEUMONIA  . Depression   . 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 Cancer     cervical cancer pre hysterectomy  . Sleep apnea     STOP BANG SCORE 5  . Legally blind   . CONSTIPATION, CHRONIC 06/26/2007    Qualifier: Diagnosis of  By: Nils Pyle CMA ([place]), Mearl Latin    . Pneumonia    Past Surgical History  Procedure Laterality Date  . Total abdominal hysterectomy  1973  . Bilateral oophorectomy  2002  . Spinal fusion  1997    and implantation of a plate   . Lobe thyroid removal    . Thyroid  lobectomy  1991    removal of left lobe thyroid  . Cholecystectomy  1965  . Appendectomy    . Cataract extraction    . Bronchoscopy  02-22-09  . Stented cardiac  artery  2007 / 2007 / 2010    X 3 STENT  . Proctoscopy  03/18/2012    Procedure: PROCTOSCOPY;  Surgeon: Adin Hector, MD;  Location: [facility];  Service: General;  Laterality: N/A;  rigid procto  . Colon surgery  2014    colon resection  . Esophagogastroduodenoscopy (egd) with esophageal dilation N/A 06/18/2013    Procedure: ESOPHAGOGASTRODUODENOSCOPY (EGD) WITH ESOPHAGEAL DILATION;  Surgeon: Inda Castle, MD;  Location: [place];  Service: Endoscopy;  Laterality: N/A;   Family History  Problem Relation Age of Onset  . Other Mother 20    Died from sepsis  . Stroke Father 20    died  . Heart disease Father   . Cancer Sister     lung  . Cancer Brother     lung   History  Substance Use Topics  . Smoking status: Former Smoker -- 1.00 packs/day for 40 years    Types: Cigarettes    Quit date: 06/26/1988  . Smokeless tobacco: Never Used     Comment: 40 pack  years  . Alcohol Use: No   OB History   Grav Para Term Preterm Abortions TAB SAB Ect Mult Living                 Review of Systems All other systems reviewed and are negative except as noted in HPI.   Allergies  Lantus; Metoprolol-hydrochlorothiazide; Novolog; Sulfonamide derivatives; Titanium; and Adhesive  Home Medications   Current Outpatient Rx  Name  Route  Sig  Dispense  Refill  . amLODipine (NORVASC) 10 MG tablet   Oral   Take 10 mg by mouth every morning. Once a day         . amoxicillin-clavulanate (AUGMENTIN ES-600) 600-42.9 MG/5ML suspension   Oral   Take 7.3 mLs (875 mg total) by mouth 2 (two) times daily.   240 mL   0   . aspirin 325 MG tablet   Oral   Take 325 mg by mouth 2 (two) times daily.          . beta carotene w/minerals (OCUVITE) tablet   Oral   Take 1 tablet by mouth daily.         [name] esomeprazole (NEXIUM) 40  MG capsule   Oral   Take 1 capsule (40 mg total) by mouth daily before breakfast.   30 capsule   6   . imipramine (TOFRANIL) 25 MG tablet   Oral   Take 75 mg by mouth at bedtime.          . insulin lispro (HUMALOG) 100 UNIT/ML injection   Subcutaneous   Inject 6 Units into the skin 2 (two) times daily. DO NOT SUBSTITUTE PER PATIENT REQUEST         . levothyroxine (SYNTHROID, LEVOTHROID) 125 MCG tablet   Oral   Take 125 mcg by mouth every morning.          . mometasone (NASONEX) 50 MCG/ACT nasal spray   Nasal   Place 2 sprays into the nose daily. As needed for congestion.         . predniSONE (DELTASONE) 5 MG tablet   Oral   Take 5 mg by mouth daily.         . ticlopidine (TICLID) 250 MG tablet   Oral   Take 1 tablet (250 mg total) by mouth 2 (two) times daily.   60 tablet   5   . triazolam (HALCION) 0.25 MG tablet   Oral   Take 0.25 mg by mouth at bedtime as needed. For sleep          BP 161/53  Pulse 100  Temp(Src) 100.3 F (37.9 C) (Oral)  Resp 20  SpO2 94% Physical Exam  Nursing note and vitals reviewed. Constitutional: She is oriented to person, place, and time. She appears well-developed and well-nourished.  HENT:  Head: Normocephalic and atraumatic.  Eyes: EOM are normal. Pupils are equal, round, and reactive to light.  Neck: Normal range of motion. Neck supple.  Cardiovascular: Normal rate, normal heart sounds and intact distal pulses.   Pulmonary/Chest: Effort normal. No respiratory distress. She has no wheezes. She has rales (RUL).  Abdominal: Bowel sounds are normal. She exhibits no distension. There is no tenderness.  Musculoskeletal: Normal range of motion. She exhibits no edema and no tenderness.  Neurological: She is alert and oriented to person, place, and time. She has normal strength. No cranial nerve deficit or sensory deficit.  Skin: Skin is warm and dry. No rash noted.  Psychiatric: She has  a normal mood and affect.    ED  Course  Procedures (including critical care time) Labs Review Labs Reviewed  CBC WITH DIFFERENTIAL - Abnormal; Notable for the following:    Hemoglobin 11.7 (*)    HCT 35.3 (*)    Neutrophils Relative % 80 (*)    Lymphocytes Relative 11 (*)    All other components within normal limits  COMPREHENSIVE METABOLIC PANEL - Abnormal; Notable for the following:    Sodium 132 (*)    Chloride 91 (*)    Glucose, Bld 195 (*)    Total Bilirubin <0.2 (*)    GFR calc non Af Amer 80 (*)    All other components within normal limits   Imaging Review Dg Chest 2 View  07/16/2013   CLINICAL DATA:  Cough  EXAM: CHEST  2 VIEW  COMPARISON:  DG CHEST 2 VIEW dated 06/15/2013  FINDINGS: Heterogeneous opacities in the right upper lobe are not significantly changed. Lungs are otherwise clear. Normal heart size. Hyperaeration.  IMPRESSION: Heterogeneous opacities in the right upper lobe are not significantly changed. Underlying mass is not excluded.   Electronically Signed   By: Maryclare Bean M.D.   On: 07/16/2013 19:23    EKG Interpretation   None       MDM   1. HCAP (healthcare-associated pneumonia)     CXR images reviewed with radiologist. Does not recommend advanced imaging now as patient continues to be symptomatic with cough and fever. Will treat for HCAP given recent admission for similar. Admit to the [place] B. Karle Starch, MD 07/16/13 [phone]

## 2013-07-17 ENCOUNTER — Encounter (HOSPITAL_COMMUNITY): Payer: Self-pay | Admitting: General Practice

## 2013-07-17 DIAGNOSIS — J8409 Other alveolar and parieto-alveolar conditions: Secondary | ICD-10-CM | POA: Diagnosis not present

## 2013-07-17 DIAGNOSIS — F29 Unspecified psychosis not due to a substance or known physiological condition: Secondary | ICD-10-CM | POA: Diagnosis not present

## 2013-07-17 DIAGNOSIS — E119 Type 2 diabetes mellitus without complications: Secondary | ICD-10-CM

## 2013-07-17 DIAGNOSIS — IMO0001 Reserved for inherently not codable concepts without codable children: Secondary | ICD-10-CM

## 2013-07-17 DIAGNOSIS — D638 Anemia in other chronic diseases classified elsewhere: Secondary | ICD-10-CM | POA: Diagnosis not present

## 2013-07-17 DIAGNOSIS — J189 Pneumonia, unspecified organism: Secondary | ICD-10-CM | POA: Diagnosis not present

## 2013-07-17 DIAGNOSIS — H548 Legal blindness, as defined in USA: Secondary | ICD-10-CM

## 2013-07-17 LAB — GLUCOSE, CAPILLARY
Glucose-Capillary: 222 mg/dL — ABNORMAL HIGH (ref 70–99)
Glucose-Capillary: 287 mg/dL — ABNORMAL HIGH (ref 70–99)
Glucose-Capillary: 263 mg/dL — ABNORMAL HIGH (ref 70–99)
Glucose-Capillary: 189 mg/dL — ABNORMAL HIGH (ref 70–99)

## 2013-07-17 LAB — INFLUENZA PANEL BY PCR (TYPE A & B)
H1N1 flu by pcr: NOT DETECTED
Influenza A By PCR: NEGATIVE
Influenza B By PCR: NEGATIVE

## 2013-07-17 LAB — CBC WITH DIFFERENTIAL/PLATELET
Basophils Absolute: 0 10*3/uL (ref 0.0–0.1)
Basophils Relative: 0 % (ref 0–1)
Eosinophils Absolute: 0 10*3/uL (ref 0.0–0.7)
Eosinophils Relative: 0 % (ref 0–5)
HCT: 32.1 % — ABNORMAL LOW (ref 36.0–46.0)
Hemoglobin: 10.7 g/dL — ABNORMAL LOW (ref 12.0–15.0)
Lymphocytes Relative: 7 % — ABNORMAL LOW (ref 12–46)
Lymphs Abs: 0.6 10*3/uL — ABNORMAL LOW (ref 0.7–4.0)
MCH: 27.5 pg (ref 26.0–34.0)
MCHC: 33.3 g/dL (ref 30.0–36.0)
MCV: 82.5 fL (ref 78.0–100.0)
Monocytes Absolute: 0.1 10*3/uL (ref 0.1–1.0)
Monocytes Relative: 2 % — ABNORMAL LOW (ref 3–12)
Neutro Abs: 8.2 10*3/uL — ABNORMAL HIGH (ref 1.7–7.7)
Neutrophils Relative %: 92 % — ABNORMAL HIGH (ref 43–77)
Platelets: 189 10*3/uL (ref 150–400)
RBC: 3.89 MIL/uL (ref 3.87–5.11)
RDW: 14.1 % (ref 11.5–15.5)
WBC: 9 10*3/uL (ref 4.0–10.5)

## 2013-07-17 LAB — LEGIONELLA ANTIGEN, URINE: Legionella Antigen, Urine: NEGATIVE

## 2013-07-17 LAB — HIV ANTIBODY (ROUTINE TESTING W REFLEX): HIV: NONREACTIVE

## 2013-07-17 LAB — STREP PNEUMONIAE URINARY ANTIGEN: Strep Pneumo Urinary Antigen: NEGATIVE

## 2013-07-17 MED ORDER — GUAIFENESIN ER 600 MG PO TB12
1200.0000 mg | ORAL_TABLET | Freq: Two times a day (BID) | ORAL | Status: DC
  Administered 2013-07-17 – 2013-07-19 (×4): 1200 mg via ORAL
  Filled 2013-07-17 (×5): qty 2

## 2013-07-17 MED ORDER — INFLUENZA VAC SPLIT QUAD 0.5 ML IM SUSP
0.5000 mL | INTRAMUSCULAR | Status: AC
  Administered 2013-07-18: 0.5 mL via INTRAMUSCULAR
  Filled 2013-07-17: qty 0.5

## 2013-07-17 MED ORDER — PREDNISONE 20 MG PO TABS
40.0000 mg | ORAL_TABLET | Freq: Every day | ORAL | Status: DC
  Administered 2013-07-18: 40 mg via ORAL
  Filled 2013-07-17 (×2): qty 2

## 2013-07-17 MED ORDER — GLUCERNA SHAKE PO LIQD
237.0000 mL | ORAL | Status: DC
  Administered 2013-07-17 – 2013-07-18 (×2): 237 mL via ORAL

## 2013-07-17 MED ORDER — BENZONATATE 100 MG PO CAPS
200.0000 mg | ORAL_CAPSULE | Freq: Three times a day (TID) | ORAL | Status: DC | PRN
  Filled 2013-07-17: qty 2

## 2013-07-17 MED ORDER — GUAIFENESIN-DM 100-10 MG/5ML PO SYRP
5.0000 mL | ORAL_SOLUTION | ORAL | Status: DC | PRN
  Filled 2013-07-17: qty 5

## 2013-07-17 MED ORDER — INSULIN LISPRO 100 UNIT/ML (KWIKPEN)
4.0000 [IU] | PEN_INJECTOR | Freq: Two times a day (BID) | SUBCUTANEOUS | Status: DC
  Administered 2013-07-17: 4 [IU] via SUBCUTANEOUS

## 2013-07-17 MED ORDER — GUAIFENESIN ER 600 MG PO TB12
600.0000 mg | ORAL_TABLET | Freq: Two times a day (BID) | ORAL | Status: DC
  Administered 2013-07-17: 600 mg via ORAL
  Filled 2013-07-17 (×2): qty 1

## 2013-07-17 MED ORDER — INSULIN LISPRO 100 UNIT/ML (KWIKPEN)
3.0000 [IU] | PEN_INJECTOR | Freq: Three times a day (TID) | SUBCUTANEOUS | Status: DC
  Administered 2013-07-17 – 2013-07-19 (×7): 3 [IU] via SUBCUTANEOUS

## 2013-07-17 MED ORDER — PNEUMOCOCCAL VAC POLYVALENT 25 MCG/0.5ML IJ INJ
0.5000 mL | INJECTION | INTRAMUSCULAR | Status: AC
  Administered 2013-07-18: 0.5 mL via INTRAMUSCULAR
  Filled 2013-07-17: qty 0.5

## 2013-07-17 MED ORDER — INSULIN LISPRO 100 UNIT/ML (KWIKPEN): 3.0000 [IU] | PEN_INJECTOR | Freq: Three times a day (TID) | SUBCUTANEOUS | Status: DC

## 2013-07-17 NOTE — Progress Notes (Signed)
Pt states she has walked the halls three times today, pt also given incentive spirometer. Pt tolerates ambulation well.

## 2013-07-17 NOTE — Progress Notes (Signed)
Pt admitted to unit at 9:50pm. Pt alert, oriented, and pleasant. Skin intact. IV intact. Pt is legally blind but states that she is highly functional at home. No family at pt bedside. States she was brought to ED by daughter. Pt oriented to room and shown how to use call bell. Pt understands to call when assistance is needed.Will continue to monitor pt per MD orders.

## 2013-07-17 NOTE — Progress Notes (Signed)
INITIAL NUTRITION ASSESSMENT  DOCUMENTATION CODES Per approved criteria  -Not Applicable   INTERVENTION: Add Glucerna Shake po daily, each supplement provides 220 kcal and 10 grams of protein. Diet texture and liquid consistency per SLP. RD to continue to follow nutrition care plan.  NUTRITION DIAGNOSIS: Swallowing difficulty related to altered GI function as evidenced by ongoing swallowing issues.   Goal: Intake to meet >90% of estimated nutrition needs.  Monitor:  weight trends, lab trends, I/O's, PO intake, supplement tolerance  Reason for Assessment: Malnutrition Screening Tool  80 y.o. female  Admitting Dx: <principal problem not specified>  ASSESSMENT: PMHx significant for COPD, scleroderma, TE fistula w/ zenkers divericulim and distal esophagheal stricture s/p dilation by GI. Pt recently admitted for CAP presenting with HCAP/non-resolution of previous PNA from 12/21-12/24 (esophageal dilatation during this admission).  Admitted with recurrence of cough and SOB after course of augmentin was completed. Per H&P, pt also may have had some recurrent aspiration events per daughter. Work-up reveals HCAP.  Per MD's H&P, MD is questioning if PEG tube may be a consideration if recurrent aspiration is responsible for pulmonary issues.  Per chart review, most recent SLP visit was on 12/24 and pt was recommended to have Dysphagia 3 diet with thin liquids. Pt reports that she is eating relatively well since that visit, despite ongoing illness. Her weight has remained stable as well. She is agreeable to Glucerna Shake daily while here to maintain weight. She states that she ate well this morning, and lunch intake was okay, however she wasn't able to chew her broccoli. Physical exam WNL.  Height: Ht Readings from Last 1 Encounters:  07/16/13 5' 2.5" (1.588 m)    Weight: Wt Readings from Last 1 Encounters:  07/16/13 133 lb (60.328 kg)    Ideal Body Weight: 113 lb  % Ideal Body  Weight: 118%  Wt Readings from Last 10 Encounters:  07/16/13 133 lb (60.328 kg)  06/18/13 133 lb 11.2 oz (60.646 kg)  06/18/13 133 lb 11.2 oz (60.646 kg)  05/15/13 131 lb (59.421 kg)  09/16/12 130 lb (58.968 kg)  05/06/12 127 lb 3.2 oz (57.698 kg)  04/22/12 129 lb 6.4 oz (58.695 kg)  04/09/12 129 lb (58.514 kg)  04/04/12 129 lb 4 oz (58.627 kg)  04/02/12 131 lb 6.4 oz (59.603 kg)    Usual Body Weight: 130 lb  % Usual Body Weight: 102%  BMI:  Body mass index is 23.92 kg/(m^2). Normal weight  Estimated Nutritional Needs: Kcal: 1450 - 1600 Protein: 65 - 75 g Fluid: 1.5 - 1.8 liters  Skin: intact  Diet Order: Dysphagia 3; thin liquids  EDUCATION NEEDS: -No education needs identified at this time   Intake/Output Summary (Last 24 hours) at 07/17/13 1042 Last data filed at 07/17/13 0534  Gross per 24 hour  Intake    120 ml  Output    550 ml  Net   -430 ml    Last BM: 1/21   Labs:   Recent Labs Lab 07/16/13 1800 07/16/13 2055  NA 132*  --   K 4.4  --   CL 91*  --   CO2 25  --   BUN 12  --   CREATININE 0.68 0.70  CALCIUM 8.7  --   GLUCOSE 195*  --     CBG (last 3)   Recent Labs  07/17/13 0745  GLUCAP 222*    Scheduled Meds: . amLODipine  10 mg Oral q morning - 10a  . aspirin  325  mg Oral BID  . beta carotene w/minerals  1 tablet Oral Daily  . ceFEPime (MAXIPIME) IV  1 g Intravenous Q24H  . fluticasone  2 spray Each Nare Daily  . guaiFENesin  600 mg Oral BID  . heparin  5,000 Units Subcutaneous Q8H  . imipramine  75 mg Oral QHS  . [START ON 07/18/2013] influenza vac split quadrivalent PF  0.5 mL Intramuscular Tomorrow-1000  . insulin lispro  4 Units Subcutaneous BID WC  . levothyroxine  125 mcg Oral q morning - 10a  . methylPREDNISolone (SOLU-MEDROL) injection  125 mg Intravenous Q8H  . pantoprazole  80 mg Oral Q1200  . [START ON 07/18/2013] pneumococcal 23 valent vaccine  0.5 mL Intramuscular Tomorrow-1000  . ticlopidine  250 mg Oral BID  .  vancomycin  500 mg Intravenous Q12H  . vancomycin  1,000 mg Intravenous Once    Continuous Infusions:   Past Medical History  Diagnosis Date  . Coronary artery disease   . Dressler's syndrome   . Hypertension   . Dyslipidemia   . Diabetes mellitus   . CVA (cerebral infarction) 1991  . Macular degeneration   . GERD (gastroesophageal reflux disease)   . Diverticulosis   . IBS (irritable bowel syndrome)   . Zenker's diverticulum   . Emphysema   . Pulmonary hypertension   . Chronic sinusitis   . Fibromyalgia   . Hypothyroidism   . Sjogren's disease   . Raynaud's phenomenon   . Cervical disc disease   . Adenomatous colon polyp   . Scleroderma   . COPD (chronic obstructive pulmonary disease)   . Arthritis   . Blood transfusion   . Myocardial infarction may 27th 2007  . Depression   . 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 Cancer     cervical cancer pre hysterectomy  . Sleep apnea     STOP BANG SCORE 5  . Legally blind   . CONSTIPATION, CHRONIC 06/26/2007    Qualifier: Diagnosis of  By: Nils Pyle CMA ([place]), Mearl Latin    . Pneumonia 07/16/2013     HX PNEUMONIA  . Pneumonia     Past Surgical History  Procedure Laterality Date  . Total abdominal hysterectomy  1973  . Bilateral oophorectomy  2002  . Spinal fusion  1997    and implantation of a plate   . Lobe thyroid removal    . Thyroid lobectomy  1991    removal of left lobe thyroid  . Cholecystectomy  1965  . Appendectomy    . Cataract extraction    . Bronchoscopy  02-22-09  . Stented cardiac  artery  2007 / 2007 / 2010    X 3 STENT  . Proctoscopy  03/18/2012    Procedure: PROCTOSCOPY;  Surgeon: Adin Hector, MD;  Location: [facility];  Service: General;  Laterality: N/A;  rigid procto  . Colon surgery  2014    colon resection  . Esophagogastroduodenoscopy (egd) with esophageal dilation N/A 06/18/2013    Procedure: ESOPHAGOGASTRODUODENOSCOPY (EGD) WITH ESOPHAGEAL DILATION;  Surgeon: Inda Castle,  MD;  Location: [facility];  Service: Endoscopy;  Laterality: N/A;    Inda Coke MS, RD, LDN Pager: [phone] After-hours pager: [phone]

## 2013-07-17 NOTE — Progress Notes (Signed)
SLP reviewed history. Based on known esophageal and cervical esophageal dysphagia, now with possible recurrent aspiration, a Modified Barium Swallow is recommended to objectively evaluate swallow function. May be possible to reduce aspiration risk strategies or modification. Will order per protocol and try to complete this pm.  Herbie Baltimore, [place] CCC-SLP [phone]

## 2013-07-17 NOTE — Progress Notes (Signed)
SLP Cancellation Note  Patient Details Name: KIMMERLY LORA MRN: [id number] DOB: 24-May-1931   Cancelled treatment:       Reason Eval/Treat Not Completed: Other (comment). Will plan for MBS tomorrow morning.   Herbie Baltimore, [place] CCC-SLP [phone]  Lynann Beaver 07/17/2013, 1:10 PM

## 2013-07-17 NOTE — Progress Notes (Signed)
[facility] PROGRESS NOTE  [name] LFY:[id number] DOB: 07/02/1930 DOA: 07/16/2013 PCP: Merrilee Seashore, MD  HPI/Subjective: Feels much better, had fever of 100.3 last night but none this morning. Less cough and sputum since admission.  Assessment/Plan: Principal Problem:   HCAP (healthcare-associated pneumonia) Active Problems:   HYPERTENSION   BOOP (bronchiolitis obliterans with organizing pneumonia)   ZENKER'S DIVERTICULUM   Diabetes mellitus   Dysphagia, unspecified(787.20)   Healthcare associated pneumonia -Patient has history of BOOP in the right upper lobe. -Whenever she comes with productive cough and fever this considered as pneumonia and treated with antibiotics. -Started on cefepime and vancomycin because of recent hospitalization. -Continue supportive management with bronchodilators, mucolytics, antitussives and oxygen as needed.  Hypertension -Continue home medications.  Dysphagia -Patient has history of Zenker's diverticulum and esophageal stricture, SLP consulted for questionable aspiration. -Recommended modified barium swallow to be done today.  Diabetes mellitus type 2 -Check hemoglobin A1c, carbohydrate modified diet. -SSI and hold her home medications.  Code Status: Full code Family Communication: Plan discussed with the patient. Disposition Plan: Remains inpatient   Consultants:  None  Procedures:  None  Antibiotics:  Vancomycin and cefepime   Objective: Filed Vitals:   07/17/13 0958  BP: 125/50  Pulse:   Temp:   Resp:     Intake/Output Summary (Last 24 hours) at 07/17/13 1151 Last data filed at 07/17/13 0534  Gross per 24 hour  Intake    120 ml  Output    550 ml  Net   -430 ml   Filed Weights   07/16/13 2209  Weight: 60.328 kg (133 lb)    Exam: General: Alert and awake, oriented x3, not in any acute distress. HEENT: anicteric sclera, pupils reactive to light and accommodation, EOMI CVS: S1-S2 clear,  no murmur rubs or gallops Chest: clear to auscultation bilaterally, no wheezing, rales or rhonchi Abdomen: soft nontender, nondistended, normal bowel sounds, no organomegaly Extremities: no cyanosis, clubbing or edema noted bilaterally Neuro: Cranial nerves II-XII intact, no focal neurological deficits  Data Reviewed: Basic Metabolic Panel:  Recent Labs Lab 07/16/13 1800 07/16/13 2055  NA 132*  --   K 4.4  --   CL 91*  --   CO2 25  --   GLUCOSE 195*  --   BUN 12  --   CREATININE 0.68 0.70  CALCIUM 8.7  --    Liver Function Tests:  Recent Labs Lab 07/16/13 1800  AST 17  ALT 15  ALKPHOS 84  BILITOT <0.2*  PROT 7.2  ALBUMIN 3.6   No results found for this basename: LIPASE, AMYLASE,  in the last 168 hours No results found for this basename: AMMONIA,  in the last 168 hours CBC:  Recent Labs Lab 07/16/13 1800 07/16/13 2055 07/17/13 0335  WBC 9.1 8.5 9.0  NEUTROABS 7.3  --  8.2*  HGB 11.7* 10.9* 10.7*  HCT 35.3* 33.2* 32.1*  MCV 83.8 83.2 82.5  PLT 211 197 189   Cardiac Enzymes: No results found for this basename: CKTOTAL, CKMB, CKMBINDEX, TROPONINI,  in the last 168 hours BNP (last 3 results)  Recent Labs  06/15/13 1840  PROBNP 190.2   CBG:  Recent Labs Lab 07/17/13 0745  GLUCAP 222*    Micro No results found for this or any previous visit (from the past 240 hour(s)).   Studies: Dg Chest 2 View  07/16/2013   CLINICAL DATA:  Cough  EXAM: CHEST  2 VIEW  COMPARISON:  DG CHEST 2 VIEW dated 06/15/2013  FINDINGS: Heterogeneous opacities in the right upper lobe are not significantly changed. Lungs are otherwise clear. Normal heart size. Hyperaeration.  IMPRESSION: Heterogeneous opacities in the right upper lobe are not significantly changed. Underlying mass is not excluded.   Electronically Signed   By: Maryclare Bean M.D.   On: 07/16/2013 19:23    Scheduled Meds: . amLODipine  10 mg Oral q morning - 10a  . aspirin  325 mg Oral BID  . beta carotene  w/minerals  1 tablet Oral Daily  . ceFEPime (MAXIPIME) IV  1 g Intravenous Q24H  . fluticasone  2 spray Each Nare Daily  . guaiFENesin  600 mg Oral BID  . heparin  5,000 Units Subcutaneous Q8H  . imipramine  75 mg Oral QHS  . [START ON 07/18/2013] influenza vac split quadrivalent PF  0.5 mL Intramuscular Tomorrow-1000  . insulin lispro  4 Units Subcutaneous BID WC  . levothyroxine  125 mcg Oral q morning - 10a  . methylPREDNISolone (SOLU-MEDROL) injection  125 mg Intravenous Q8H  . pantoprazole  80 mg Oral Q1200  . [START ON 07/18/2013] pneumococcal 23 valent vaccine  0.5 mL Intramuscular Tomorrow-1000  . ticlopidine  250 mg Oral BID  . vancomycin  500 mg Intravenous Q12H  . vancomycin  1,000 mg Intravenous Once   Continuous Infusions:      Time spent: 35 minutes    [facility] A  [facility] Pager [phone] If 7PM-7AM, please contact night-coverage at [email or web address], password [facility] 07/17/2013, 11:51 AM  LOS: 1 day

## 2013-07-18 ENCOUNTER — Inpatient Hospital Stay (HOSPITAL_COMMUNITY): Payer: Medicare Other

## 2013-07-18 DIAGNOSIS — J189 Pneumonia, unspecified organism: Secondary | ICD-10-CM | POA: Diagnosis not present

## 2013-07-18 DIAGNOSIS — J310 Chronic rhinitis: Secondary | ICD-10-CM | POA: Diagnosis not present

## 2013-07-18 DIAGNOSIS — D638 Anemia in other chronic diseases classified elsewhere: Secondary | ICD-10-CM | POA: Diagnosis not present

## 2013-07-18 DIAGNOSIS — K56609 Unspecified intestinal obstruction, unspecified as to partial versus complete obstruction: Secondary | ICD-10-CM

## 2013-07-18 DIAGNOSIS — M349 Systemic sclerosis, unspecified: Secondary | ICD-10-CM

## 2013-07-18 DIAGNOSIS — J8409 Other alveolar and parieto-alveolar conditions: Secondary | ICD-10-CM | POA: Diagnosis not present

## 2013-07-18 LAB — CBC
HCT: 30 % — ABNORMAL LOW (ref 36.0–46.0)
Hemoglobin: 10.1 g/dL — ABNORMAL LOW (ref 12.0–15.0)
MCH: 27.3 pg (ref 26.0–34.0)
MCHC: 33.7 g/dL (ref 30.0–36.0)
MCV: 81.1 fL (ref 78.0–100.0)
Platelets: 206 10*3/uL (ref 150–400)
RBC: 3.7 MIL/uL — ABNORMAL LOW (ref 3.87–5.11)
RDW: 14 % (ref 11.5–15.5)
WBC: 10.2 10*3/uL (ref 4.0–10.5)

## 2013-07-18 LAB — BASIC METABOLIC PANEL
BUN: 20 mg/dL (ref 6–23)
CO2: 26 mEq/L (ref 19–32)
Calcium: 8.4 mg/dL (ref 8.4–10.5)
Chloride: 93 mEq/L — ABNORMAL LOW (ref 96–112)
Creatinine, Ser: 0.66 mg/dL (ref 0.50–1.10)
GFR calc Af Amer: >90 mL/min (ref >90)
GFR calc non Af Amer: 81 mL/min — ABNORMAL LOW (ref >90)
Glucose, Bld: 197 mg/dL — ABNORMAL HIGH (ref 70–99)
Potassium: 3.7 mEq/L (ref 3.7–5.3)
Sodium: 132 mEq/L — ABNORMAL LOW (ref 137–147)

## 2013-07-18 LAB — GLUCOSE, CAPILLARY
Glucose-Capillary: 209 mg/dL — ABNORMAL HIGH (ref 70–99)
Glucose-Capillary: 244 mg/dL — ABNORMAL HIGH (ref 70–99)
Glucose-Capillary: 262 mg/dL — ABNORMAL HIGH (ref 70–99)
Glucose-Capillary: 203 mg/dL — ABNORMAL HIGH (ref 70–99)

## 2013-07-18 MED ORDER — LEVOFLOXACIN 750 MG PO TABS
750.0000 mg | ORAL_TABLET | ORAL | Status: DC
  Administered 2013-07-18: 750 mg via ORAL
  Filled 2013-07-18: qty 1

## 2013-07-18 MED ORDER — POLYETHYLENE GLYCOL 3350 17 G PO PACK
17.0000 g | PACK | Freq: Every day | ORAL | Status: DC
  Filled 2013-07-18 (×2): qty 1

## 2013-07-18 MED ORDER — CALCIUM CARBONATE ANTACID 500 MG PO CHEW
2.0000 | CHEWABLE_TABLET | Freq: Three times a day (TID) | ORAL | Status: DC
  Administered 2013-07-18 – 2013-07-19 (×4): 400 mg via ORAL
  Filled 2013-07-18 (×6): qty 2

## 2013-07-18 MED ORDER — ESOMEPRAZOLE MAGNESIUM 40 MG PO CPDR
40.0000 mg | DELAYED_RELEASE_CAPSULE | Freq: Every day | ORAL | Status: DC
  Administered 2013-07-18 – 2013-07-19 (×2): 40 mg via ORAL
  Filled 2013-07-18 (×2): qty 1

## 2013-07-18 MED ORDER — PREDNISONE 20 MG PO TABS
30.0000 mg | ORAL_TABLET | Freq: Every day | ORAL | Status: DC
  Administered 2013-07-19: 30 mg via ORAL
  Filled 2013-07-18 (×2): qty 1

## 2013-07-18 MED ORDER — ALUM & MAG HYDROXIDE-SIMETH 200-200-20 MG/5ML PO SUSP
30.0000 mL | Freq: Four times a day (QID) | ORAL | Status: DC | PRN
  Administered 2013-07-18: 30 mL via ORAL
  Filled 2013-07-18: qty 30

## 2013-07-18 MED ORDER — TRAZODONE 25 MG HALF TABLET
25.0000 mg | ORAL_TABLET | Freq: Every evening | ORAL | Status: DC | PRN
  Administered 2013-07-18: 25 mg via ORAL
  Filled 2013-07-18: qty 1

## 2013-07-18 NOTE — Progress Notes (Addendum)
Pt c/o heartburn through the night w/ no other symptoms. V/s stable. Notified provider on call. Order for maalox given prn given. Maalox given to pt at 0410, w/ no relief and has not been able to sleep well tonight even with prn sleep aid. Pt states that she would like her daughter to bring her Nexium to the hospital and take that instead of the scheduled protonix, stating "I think it works better." Informed pt that I would share that information with her day nurse and encouraged the pt to inform the attending MD. Will continue to monitor pt.    Pt also states that she usually takes 6 units of insulin at home.

## 2013-07-18 NOTE — Care Management Note (Signed)
    Page 1 of 1   07/18/2013     4:50:41 PM   CARE MANAGEMENT NOTE 07/18/2013  Patient:  [name], [name]   Account Number:  [id number]  Date Initiated:  07/18/2013  Documentation initiated by:  Tomi Bamberger  Subjective/Objective Assessment:   dx pna  admit- lives alone. legally blind.  pt is ambulatory per RN.     Action/Plan:   Anticipated DC Date:  07/19/2013   Anticipated DC Plan:  [place]  CM consult      Choice offered to / List presented to:             Status of service:  In process, will continue to follow Medicare Important Message given?   (If response is "NO", the following Medicare IM given date fields will be blank) Date Medicare IM given:   Date Additional Medicare IM given:    Discharge Disposition:    Per UR Regulation:  Reviewed for med. necessity/level of care/duration of stay  If discussed at [place] of Stay Meetings, dates discussed:    Comments:

## 2013-07-18 NOTE — Progress Notes (Signed)
[facility] PROGRESS NOTE  [name] WCH:[id number] DOB: [birth year] DOA: 07/16/2013 PCP: Merrilee Seashore, MD  HPI/Subjective: No fever overnight, cough and sputum improving.  Assessment/Plan: Principal Problem:   HCAP (healthcare-associated pneumonia) Active Problems:   HYPERTENSION   BOOP (bronchiolitis obliterans with organizing pneumonia)   ZENKER'S DIVERTICULUM   Diabetes mellitus   Dysphagia, unspecified(787.20)   Healthcare associated pneumonia -Patient has history of BOOP in the right upper lobe. -Whenever she comes with productive cough and fever this considered as pneumonia and treated with antibiotics. -Started on cefepime and vancomycin because of recent hospitalization. -Continue supportive management with bronchodilators, mucolytics, antitussives and oxygen as needed. -I will de-escalate antibiotics, discontinue cefepime and vancomycin and start high-dose Levaquin.  Hypertension -Continue home medications.  Dysphagia -Patient has history of Zenker's diverticulum and esophageal stricture, SLP consulted for questionable aspiration. -Recommended modified barium swallow to be done today. -Complaining about heartburn, add antacid  Diabetes mellitus type 2 -Check hemoglobin A1c, carbohydrate modified diet. -SSI and hold her home medications.  Code Status: Full code Family Communication: Plan discussed with the patient. Disposition Plan: Remains inpatient   Consultants:  None  Procedures:  None  Antibiotics:  Vancomycin and cefepime   Objective: Filed Vitals:   07/18/13 1001  BP: 130/60  Pulse:   Temp:   Resp:     Intake/Output Summary (Last 24 hours) at 07/18/13 1125 Last data filed at 07/18/13 0904  Gross per 24 hour  Intake    510 ml  Output      0 ml  Net    510 ml   Filed Weights   07/16/13 2209  Weight: 60.328 kg (133 lb)    Exam: General: Alert and awake, oriented x3, not in any acute distress. HEENT:  anicteric sclera, pupils reactive to light and accommodation, EOMI CVS: S1-S2 clear, no murmur rubs or gallops Chest: clear to auscultation bilaterally, no wheezing, rales or rhonchi Abdomen: soft nontender, nondistended, normal bowel sounds, no organomegaly Extremities: no cyanosis, clubbing or edema noted bilaterally Neuro: Cranial nerves II-XII intact, no focal neurological deficits  Data Reviewed: Basic Metabolic Panel:  Recent Labs Lab 07/16/13 1800 07/16/13 2055 07/18/13 0604  NA 132*  --  132*  K 4.4  --  3.7  CL 91*  --  93*  CO2 25  --  26  GLUCOSE 195*  --  197*  BUN 12  --  20  CREATININE 0.68 0.70 0.66  CALCIUM 8.7  --  8.4   Liver Function Tests:  Recent Labs Lab 07/16/13 1800  AST 17  ALT 15  ALKPHOS 84  BILITOT <0.2*  PROT 7.2  ALBUMIN 3.6   No results found for this basename: LIPASE, AMYLASE,  in the last 168 hours No results found for this basename: AMMONIA,  in the last 168 hours CBC:  Recent Labs Lab 07/16/13 1800 07/16/13 2055 07/17/13 0335 07/18/13 0604  WBC 9.1 8.5 9.0 10.2  NEUTROABS 7.3  --  8.2*  --   HGB 11.7* 10.9* 10.7* 10.1*  HCT 35.3* 33.2* 32.1* 30.0*  MCV 83.8 83.2 82.5 81.1  PLT 211 197 189 206   Cardiac Enzymes: No results found for this basename: CKTOTAL, CKMB, CKMBINDEX, TROPONINI,  in the last 168 hours BNP (last 3 results)  Recent Labs  06/15/13 1840  PROBNP 190.2   CBG:  Recent Labs Lab 07/17/13 0745 07/17/13 1212 07/17/13 1651 07/17/13 2141 07/18/13 0734  GLUCAP 222* 287* 263* 189* 209*    Micro Recent Results (from the  past 240 hour(s))  CULTURE, BLOOD (ROUTINE X 2)     Status: None   Collection Time    07/16/13  8:55 PM      Result Value Range Status   Specimen Description BLOOD ARM LEFT   Final   Special Requests BOTTLES DRAWN AEROBIC AND ANAEROBIC 8CC   Final   Culture  Setup Time     Final   Value: 07/17/2013 00:44     Performed at [facility]   Culture     Final   Value:         BLOOD CULTURE RECEIVED NO GROWTH TO DATE CULTURE WILL BE HELD FOR 5 DAYS BEFORE ISSUING A FINAL NEGATIVE REPORT     Performed at [facility]   Report Status PENDING   Incomplete  CULTURE, BLOOD (ROUTINE X 2)     Status: None   Collection Time    07/16/13  9:01 PM      Result Value Range Status   Specimen Description BLOOD ARM RIGHT   Final   Special Requests BOTTLES DRAWN AEROBIC ONLY [facility]   Final   Culture  Setup Time     Final   Value: 07/17/2013 00:44     Performed at [facility]   Culture     Final   Value:        BLOOD CULTURE RECEIVED NO GROWTH TO DATE CULTURE WILL BE HELD FOR 5 DAYS BEFORE ISSUING A FINAL NEGATIVE REPORT     Performed at [facility]   Report Status PENDING   Incomplete     Studies: Dg Chest 2 View  07/16/2013   CLINICAL DATA:  Cough  EXAM: CHEST  2 VIEW  COMPARISON:  DG CHEST 2 VIEW dated 06/15/2013  FINDINGS: Heterogeneous opacities in the right upper lobe are not significantly changed. Lungs are otherwise clear. Normal heart size. Hyperaeration.  IMPRESSION: Heterogeneous opacities in the right upper lobe are not significantly changed. Underlying mass is not excluded.   Electronically Signed   By: Maryclare Bean M.D.   On: 07/16/2013 19:23    Scheduled Meds: . amLODipine  10 mg Oral q morning - 10a  . aspirin  325 mg Oral BID  . beta carotene w/minerals  1 tablet Oral Daily  . ceFEPime (MAXIPIME) IV  1 g Intravenous Q24H  . esomeprazole  40 mg Oral Q1200  . feeding supplement (GLUCERNA SHAKE)  237 mL Oral Q24H  . fluticasone  2 spray Each Nare Daily  . guaiFENesin  1,200 mg Oral BID  . heparin  5,000 Units Subcutaneous Q8H  . imipramine  75 mg Oral QHS  . insulin lispro  3 Units Subcutaneous TID WC  . levothyroxine  125 mcg Oral q morning - 10a  . predniSONE  40 mg Oral Q breakfast  . ticlopidine  250 mg Oral BID  . vancomycin  500 mg Intravenous Q12H  . vancomycin  1,000 mg Intravenous Once   Continuous Infusions:      Time  spent: 35 minutes    [facility] A  [facility] Pager [phone] If 7PM-7AM, please contact night-coverage at [email or web address], password [facility] 07/18/2013, 11:25 AM  LOS: 2 days

## 2013-07-18 NOTE — Procedures (Signed)
Objective Swallowing Evaluation: Modified Barium Swallowing Study  Patient Details  Name: [name] MRN: [id number] Date of Birth: July 29, 1930  Today's Date: 07/18/2013 Time: 1035-1105 SLP Time Calculation (min): 30 min  Past Medical History:  Past Medical History  Diagnosis Date  . Coronary artery disease   . Dressler's syndrome   . Hypertension   . Dyslipidemia   . Diabetes mellitus   . CVA (cerebral infarction) 1991  . Macular degeneration   . GERD (gastroesophageal reflux disease)   . Diverticulosis   . IBS (irritable bowel syndrome)   . Zenker's diverticulum   . Emphysema   . Pulmonary hypertension   . Chronic sinusitis   . Fibromyalgia   . Hypothyroidism   . Sjogren's disease   . Raynaud's phenomenon   . Cervical disc disease   . Adenomatous colon polyp   . Scleroderma   . COPD (chronic obstructive pulmonary disease)   . Arthritis   . Blood transfusion   . Myocardial infarction may 27th 2007  . Depression   . 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 Cancer     cervical cancer pre hysterectomy  . Sleep apnea     STOP BANG SCORE 5  . Legally blind   . CONSTIPATION, CHRONIC 06/26/2007    Qualifier: Diagnosis of  By: Nils Pyle CMA ([place]), Mearl Latin    . Pneumonia 07/16/2013     HX PNEUMONIA  . Pneumonia    Past Surgical History:  Past Surgical History  Procedure Laterality Date  . Total abdominal hysterectomy  1973  . Bilateral oophorectomy  2002  . Spinal fusion  1997    and implantation of a plate   . Lobe thyroid removal    . Thyroid lobectomy  1991    removal of left lobe thyroid  . Cholecystectomy  1965  . Appendectomy    . Cataract extraction    . Bronchoscopy  02-22-09  . Stented cardiac  artery  2007 / 2007 / 2010    X 3 STENT  . Proctoscopy  03/18/2012    Procedure: PROCTOSCOPY;  Surgeon: Adin Hector, MD;  Location: [facility];  Service: General;  Laterality: N/A;  rigid procto  . Colon surgery  2014    colon resection  .  Esophagogastroduodenoscopy (egd) with esophageal dilation N/A 06/18/2013    Procedure: ESOPHAGOGASTRODUODENOSCOPY (EGD) WITH ESOPHAGEAL DILATION;  Surgeon: Inda Castle, MD;  Location: [facility];  Service: Endoscopy;  Laterality: N/A;   HPI:  This is a 80 y.o. year old female with prior hx/p COPD, scleroderma-on chronic prednisone, BOOP, TE fistula w/ zenkers divericulim and distal esophagheal stricture s/p dilation by GI, recent admission for CAP presenting with HCAP/non resolution of previous PNA. Pt noted to have been admitted over [place] with CAP 12/21-12/24 (esophageal dilatation during this admission). Pt was initially placed on vanc and cefepime, then transitioned to augmentin. Was discharged on extended course of augmentin. Pt states that she took augmentin until last week Saturday. Pt states that she had recurrence of cough, SOB day after augmentin was completed. Pt has had progressive cough and SOB since this point. Has also had some rhinorrhea and nasal congestion. Mild subjective chills at home. Also with ? Recurrent aspiration events per daughter.      Assessment / Plan / Recommendation Clinical Impression  Dysphagia Diagnosis: Mild pharyngeal phase dysphagia;Moderate cervical esophageal phase dysphagia;Suspected primary esophageal dysphagia Clinical impression: Pt demonstrates a primary cervical esophageal phase dysphagia with  stasis and residuals that require strategies to transit. There is a mild pharyngeal dysphagia with trace flash to frank penetration due to decreased airway closure or possibly decreased UES opening during the swallow. Unlikely that this is resulting in an aspiration pna as it very trace amount, not building to any significant quantity over the testing period. Pt was noted to have limited opening of UES, likely due to pressure from cervical spine. This pressure has also led to small, previously diagnosed Zenker's diverticulum and moderate stasis of liqudis and  particularly solids in diverticulum and in cervical esophagus. Found that head turn to left and multiple sips of thin liquids helps to clear stasis. Pt was also encouraged to clear throat intermittently and avoid straws and overlarge sips to reduce aspiration risk. Pt was given written instructions and also esopahgeal precautions. Risk of aspiration likely coming from esophageal source - stasis or dx of GERD than oropharygeal aspiration. No SLP f/u needed. Education complete.     Treatment Recommendation  No treatment recommended at this time    Diet Recommendation Dysphagia 3 (Mechanical Soft);Thin liquid   Liquid Administration via: Cup;No straw Medication Administration: Whole meds with liquid Supervision: Patient able to self feed Compensations: Slow rate;Small sips/bites;Follow solids with liquid;Clear throat intermittently Postural Changes and/or Swallow Maneuvers: Head turn left during swallow;Seated upright 90 degrees    Other  Recommendations Oral Care Recommendations: Oral care BID   Follow Up Recommendations  None    Frequency and Duration        Pertinent Vitals/Pain NA    SLP Swallow Goals     General HPI: This is a 80 y.o. year old female with prior hx/p COPD, scleroderma-on chronic prednisone, BOOP, TE fistula w/ zenkers divericulim and distal esophagheal stricture s/p dilation by GI, recent admission for CAP presenting with HCAP/non resolution of previous PNA. Pt noted to have been admitted over [place] with CAP 12/21-12/24 (esophageal dilatation during this admission). Pt was initially placed on vanc and cefepime, then transitioned to augmentin. Was discharged on extended course of augmentin. Pt states that she took augmentin until last week Saturday. Pt states that she had recurrence of cough, SOB day after augmentin was completed. Pt has had progressive cough and SOB since this point. Has also had some rhinorrhea and nasal congestion. Mild subjective chills at home.  Also with ? Recurrent aspiration events per daughter.  Type of Study: Modified Barium Swallowing Study Reason for Referral: Objectively evaluate swallowing function Previous Swallow Assessment: esophagram, see history Diet Prior to this Study: Dysphagia 3 (soft);Thin liquids Temperature Spikes Noted: No Respiratory Status: Room air History of Recent Intubation: No Behavior/Cognition: Alert;Cooperative;Pleasant mood Oral Cavity - Dentition: Adequate natural dentition Oral Motor / Sensory Function: Within functional limits Self-Feeding Abilities: Able to feed self Patient Positioning: Upright in chair Baseline Vocal Quality: Clear Volitional Cough: Strong Volitional Swallow: Able to elicit Anatomy:  (Cervical fusion, Zenker's - small) Pharyngeal Secretions: Not observed secondary MBS    Reason for Referral Objectively evaluate swallowing function   Oral Phase Oral Preparation/Oral Phase Oral Phase: WFL   Pharyngeal Phase Pharyngeal Phase Pharyngeal Phase: Impaired Pharyngeal - Thin Pharyngeal - Thin Cup: Reduced airway/laryngeal closure;Pharyngeal residue - cp segment;Pharyngeal residue - valleculae;Pharyngeal residue - pyriform sinuses;Compensatory strategies attempted (Comment);Penetration/Aspiration during swallow Penetration/Aspiration details (thin cup): Material enters airway, remains ABOVE vocal cords then ejected out;Material does not enter airway;Material enters airway, CONTACTS cords and not ejected out Pharyngeal - Thin Straw: Not tested Pharyngeal - Solids Pharyngeal - Puree: Pharyngeal residue -  cp segment Pharyngeal - Mechanical Soft: Pharyngeal residue - cp segment Pharyngeal - Pill: Within functional limits  Cervical Esophageal Phase    GO    Cervical Esophageal Phase Cervical Esophageal Phase: Impaired Cervical Esophageal Phase - Comment Cervical Esophageal Comment: see HPI        Herbie [place], MA CCC-SLP [phone]  Lynann Beaver 07/18/2013, 4:09 PM

## 2013-07-18 NOTE — Progress Notes (Signed)
Inpatient Diabetes Program Recommendations  AACE/ADA: New Consensus Statement on Inpatient Glycemic Control (2013)  Target Ranges:  Prepandial:   less than 140 mg/dL      Peak postprandial:   less than 180 mg/dL (1-2 hours)      Critically ill patients:  140 - 180 mg/dL  Results for NANDINI, BOGDANSKI (MRN [id number]) as of 07/18/2013 12:45  Ref. Range 07/17/2013 12:12 07/17/2013 16:51 07/17/2013 21:41 07/18/2013 07:34 07/18/2013 11:52  Glucose-Capillary Latest Range: 70-99 mg/dL 287 (H) 263 (H) 189 (H) 209 (H) 244 (H)   Consider adding low dose basal during steroid therapy and increase Humalog TID.   Thank you  Raoul Pitch BSN, RN,CDE Inpatient Diabetes Coordinator [phone] (team pager)

## 2013-07-18 NOTE — Progress Notes (Signed)
Pt refuses to take Mirlax, states it makes her incontinent. Will continue to monitor per MD orders.

## 2013-07-19 DIAGNOSIS — J189 Pneumonia, unspecified organism: Secondary | ICD-10-CM | POA: Diagnosis not present

## 2013-07-19 DIAGNOSIS — D638 Anemia in other chronic diseases classified elsewhere: Secondary | ICD-10-CM | POA: Diagnosis not present

## 2013-07-19 DIAGNOSIS — F29 Unspecified psychosis not due to a substance or known physiological condition: Secondary | ICD-10-CM | POA: Diagnosis not present

## 2013-07-19 DIAGNOSIS — J8409 Other alveolar and parieto-alveolar conditions: Secondary | ICD-10-CM | POA: Diagnosis not present

## 2013-07-19 LAB — GLUCOSE, CAPILLARY
Glucose-Capillary: 153 mg/dL — ABNORMAL HIGH (ref 70–99)
Glucose-Capillary: 191 mg/dL — ABNORMAL HIGH (ref 70–99)

## 2013-07-19 MED ORDER — LEVOFLOXACIN 750 MG PO TABS: 750.0000 mg | ORAL_TABLET | Freq: Every day | ORAL | Status: DC

## 2013-07-19 NOTE — Progress Notes (Signed)
Pt given discharge instructions.  PIV removed.  Pt taken to discharge location via wheelchair.

## 2013-07-19 NOTE — Discharge Summary (Signed)
Physician Discharge Summary  [name] [id number] DOB: May 17, 1931 DOA: 07/16/2013  PCP: Merrilee Seashore, MD  Admit date: 07/16/2013 Discharge date: 07/19/2013  Time spent: 40 minutes  Recommendations for Outpatient Follow-up:  1. Followup with primary care physician within one week.  Discharge Diagnoses:  Principal Problem:   HCAP (healthcare-associated pneumonia) Active Problems:   HYPERTENSION   BOOP (bronchiolitis obliterans with organizing pneumonia)   ZENKER'S DIVERTICULUM   Diabetes mellitus   Dysphagia, unspecified(787.20)   Discharge Condition: Stable  Diet recommendation: Carb modified diet, dysphagia 3  Filed Weights   07/16/13 2209  Weight: 60.328 kg (133 lb)    History of present illness:  History of Present Illness:This is a 81 y.o. year old female with prior hx/p COPD, scleroderma-on chronic prednisone, BOOP, TE fistula w/ zenkers divericulim and distal esophagheal stricture s/p dilation by GI, recent admission for CAP presenting with HCAP/non resolution of previous PNA. Pt noted to have been admitted over [place] with CAP 12/21-12/24 (esophageal dilatation during this admission). Pt was initially placed on vanc and cefepime, then transitioned to augmentin. Was discharged on extended course of augmentin. Pt states that she took augmentin until last week Saturday. Pt states that she had recurrence of cough, SOB day after augmentin was completed. Pt has had progressive cough and SOB since this point. Has also had some rhinorrhea and nasal congestion. Mild subjective chills at home. Also with ? Recurrent aspiration events per daughter. Tmax 102 at home today. No nausea or vomiting. Mild wheezing at home in setting of COPD.  IN the ER, pt satting in low 90s on RA. Tmax 100.3. CXR showed persistence of RUL PNA in comparison to 12/21 CXR. Pt initially started on vanc and zosyn in ER.   Hospital Course:   1. Pneumonia: This is treated as a health care  associated pneumonia, patient was treated for pneumonia and December of 2014, patient has history of BOOP in the right upper lobe, came in with productive cough and fever considered pneumonia and started on antibiotics. She was started on cefepime and vancomycin because of recent hospitalization. Supportive management with bronchodilators, mucolytics and oxygen continued. Patient was afebrile since the first day, antibiotic switched to Levaquin and discharge on 5 more days of Levaquin. Flu PCR was negative on 1/21.  2. Hypertension: Home medications continued throughout the hospital stay.  3. Dysphagia: Patient has history of Zenker's diverticulum and esophageal stricture, SOB was consulted for questionable aspiration. SLP recommended modified barium swallow and according to the results recommended dysphagia 3 with thin liquids.  4. Diabetes mellitus type 2: Patient is on twice a day Humalog insulin, needs to followup with primary care physician, carbohydrate modified diet.  Procedures:  None  Consultations:  None  Discharge Exam: Filed Vitals:   07/19/13 0501  BP: 133/68  Pulse: 75  Temp: 98 F (36.7 C)  Resp: 20   General: Alert and awake, oriented x3, not in any acute distress. HEENT: anicteric sclera, pupils reactive to light and accommodation, EOMI CVS: S1-S2 clear, no murmur rubs or gallops Chest: clear to auscultation bilaterally, no wheezing, rales or rhonchi Abdomen: soft nontender, nondistended, normal bowel sounds, no organomegaly Extremities: no cyanosis, clubbing or edema noted bilaterally Neuro: Cranial nerves II-XII intact, no focal neurological deficits  Discharge Instructions  Discharge Orders   Future Orders Complete By Expires   Diet Carb Modified  As directed    Increase activity slowly  As directed        Medication List    STOP  taking these medications       amoxicillin-clavulanate 600-42.9 MG/5ML suspension  Commonly known as:  AUGMENTIN ES-600       TAKE these medications       amLODipine 10 MG tablet  Commonly known as:  NORVASC  Take 10 mg by mouth every morning. Once a day     aspirin 325 MG tablet  Take 325 mg by mouth 2 (two) times daily.     beta carotene w/minerals tablet  Take 1 tablet by mouth daily.     esomeprazole 40 MG capsule  Commonly known as:  NEXIUM  Take 1 capsule (40 mg total) by mouth daily before breakfast.     imipramine 25 MG tablet  Commonly known as:  TOFRANIL  Take 75 mg by mouth at bedtime.     insulin lispro 100 UNIT/ML injection  Commonly known as:  HUMALOG  Inject 6 Units into the skin 2 (two) times daily. DO NOT SUBSTITUTE PER PATIENT REQUEST     levofloxacin 750 MG tablet  Commonly known as:  LEVAQUIN  Take 1 tablet (750 mg total) by mouth daily.     levothyroxine 125 MCG tablet  Commonly known as:  SYNTHROID, LEVOTHROID  Take 125 mcg by mouth every morning.     mometasone 50 MCG/ACT nasal spray  Commonly known as:  NASONEX  Place 2 sprays into the nose daily. As needed for congestion.     predniSONE 5 MG tablet  Commonly known as:  DELTASONE  Take 5 mg by mouth daily.     ticlopidine 250 MG tablet  Commonly known as:  TICLID  Take 1 tablet (250 mg total) by mouth 2 (two) times daily.     triazolam 0.25 MG tablet  Commonly known as:  HALCION  Take 0.25 mg by mouth at bedtime as needed. For sleep       Allergies  Allergen Reactions  . Lantus [Insulin Glargine]     Patient states "sulfate binder causes sinus infection/pneumonia".  . Metoprolol-Hydrochlorothiazide     REACTION: decreased bp  . Novolog [Insulin Aspart]     Patient states "sulfate binder causes sinus infection/pneumonia". Tolerates Humalog.  . Sulfonamide Derivatives Hives  . Titanium     Cervical plate - had to change for stainless steel  . Adhesive [Tape] Hives and Rash       Follow-up Information   Follow up with [facility], MD In 1 week.   Specialty:  Internal Medicine   Contact  information:   [address] [place] [address] [phone]        The results of significant diagnostics from this hospitalization (including imaging, microbiology, ancillary and laboratory) are listed below for reference.    Significant Diagnostic Studies: Dg Chest 2 View  07/16/2013   CLINICAL DATA:  Cough  EXAM: CHEST  2 VIEW  COMPARISON:  DG CHEST 2 VIEW dated 06/15/2013  FINDINGS: Heterogeneous opacities in the right upper lobe are not significantly changed. Lungs are otherwise clear. Normal heart size. Hyperaeration.  IMPRESSION: Heterogeneous opacities in the right upper lobe are not significantly changed. Underlying mass is not excluded.   Electronically Signed   By: Maryclare Bean M.D.   On: 07/16/2013 19:23   Dg Swallowing Func-speech Pathology  07/18/2013   Katherene Ponto Deblois, CCC-SLP     07/18/2013  4:11 PM Objective Swallowing Evaluation: Modified Barium Swallowing Study   Patient Details  Name: CASEE WURSTER MRN: [id number] Date of Birth: 20-Jan-1931  Today's Date:  07/18/2013 Time: 1035-1105 SLP Time Calculation (min): 30 min  Past Medical History:  Past Medical History  Diagnosis Date  . Coronary artery disease   . Dressler's syndrome   . Hypertension   . Dyslipidemia   . Diabetes mellitus   . CVA (cerebral infarction) 1991  . Macular degeneration   . GERD (gastroesophageal reflux disease)   . Diverticulosis   . IBS (irritable bowel syndrome)   . Zenker's diverticulum   . Emphysema   . Pulmonary hypertension   . Chronic sinusitis   . Fibromyalgia   . Hypothyroidism   . Sjogren's disease   . Raynaud's phenomenon   . Cervical disc disease   . Adenomatous colon polyp   . Scleroderma   . COPD (chronic obstructive pulmonary disease)   . Arthritis   . Blood transfusion   . Myocardial infarction may 27th 2007  . Depression   . 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 Cancer     cervical cancer pre hysterectomy  . Sleep apnea     STOP BANG SCORE 5  . Legally  blind   . CONSTIPATION, CHRONIC 06/26/2007    Qualifier: Diagnosis of  By: Nils Pyle CMA ([place]), Mearl Latin    . Pneumonia 07/16/2013     HX PNEUMONIA  . Pneumonia    Past Surgical History:  Past Surgical History  Procedure Laterality Date  . Total abdominal hysterectomy  1973  . Bilateral oophorectomy  2002  . Spinal fusion  1997    and implantation of a plate   . Lobe thyroid removal    . Thyroid lobectomy  1991    removal of left lobe thyroid  . Cholecystectomy  1965  . Appendectomy    . Cataract extraction    . Bronchoscopy  02-22-09  . Stented cardiac  artery  2007 / 2007 / 2010    X 3 STENT  . Proctoscopy  03/18/2012    Procedure: PROCTOSCOPY;  Surgeon: Adin Hector, MD;   Location: [facility];  Service: General;  Laterality: N/A;  rigid  procto  . Colon surgery  2014    colon resection  . Esophagogastroduodenoscopy (egd) with esophageal dilation N/A  06/18/2013    Procedure: ESOPHAGOGASTRODUODENOSCOPY (EGD) WITH ESOPHAGEAL  DILATION;  Surgeon: Inda Castle, MD;  Location: [place];   Service: Endoscopy;  Laterality: N/A;   HPI:  This is a 80 y.o. year old female with prior hx/p COPD,  scleroderma-on chronic prednisone, BOOP, TE fistula w/ zenkers  divericulim and distal esophagheal stricture s/p dilation by GI,  recent admission for CAP presenting with HCAP/non resolution of  previous PNA. Pt noted to have been admitted over [place] with  CAP 12/21-12/24 (esophageal dilatation during this admission). Pt  was initially placed on vanc and cefepime, then transitioned to  augmentin. Was discharged on extended course of augmentin. Pt  states that she took augmentin until last week Saturday. Pt  states that she had recurrence of cough, SOB day after augmentin  was completed. Pt has had progressive cough and SOB since this  point. Has also had some rhinorrhea and nasal congestion. Mild  subjective chills at home. Also with ? Recurrent aspiration  events per daughter.      Assessment / Plan / Recommendation Clinical  Impression  Dysphagia Diagnosis: Mild pharyngeal phase dysphagia;Moderate  cervical esophageal phase dysphagia;Suspected primary esophageal  dysphagia Clinical impression: Pt demonstrates a primary cervical  esophageal phase dysphagia with stasis and  residuals that require  strategies to transit. There is a mild pharyngeal dysphagia with  trace flash to frank penetration due to decreased airway closure  or possibly decreased UES opening during the swallow. Unlikely  that this is resulting in an aspiration pna as it very trace  amount, not building to any significant quantity over the testing  period. Pt was noted to have limited opening of UES, likely due  to pressure from cervical spine. This pressure has also led to  small, previously diagnosed Zenker's diverticulum and moderate  stasis of liqudis and particularly solids in diverticulum and in  cervical esophagus. Found that head turn to left and multiple  sips of thin liquids helps to clear stasis. Pt was also  encouraged to clear throat intermittently and avoid straws and  overlarge sips to reduce aspiration risk. Pt was given written  instructions and also esopahgeal precautions. Risk of aspiration  likely coming from esophageal source - stasis or dx of GERD than  oropharygeal aspiration. No SLP f/u needed. Education complete.     Treatment Recommendation  No treatment recommended at this time    Diet Recommendation Dysphagia 3 (Mechanical Soft);Thin liquid   Liquid Administration via: Cup;No straw Medication Administration: Whole meds with liquid Supervision: Patient able to self feed Compensations: Slow rate;Small sips/bites;Follow solids with  liquid;Clear throat intermittently Postural Changes and/or Swallow Maneuvers: Head turn left during  swallow;Seated upright 90 degrees    Other  Recommendations Oral Care Recommendations: Oral care BID   Follow Up Recommendations  None    Frequency and Duration        Pertinent Vitals/Pain NA    SLP Swallow Goals      General HPI: This is a 80 y.o. year old female with prior hx/p  COPD, scleroderma-on chronic prednisone, BOOP, TE fistula w/  zenkers divericulim and distal esophagheal stricture s/p dilation  by GI, recent admission for CAP presenting with HCAP/non  resolution of previous PNA. Pt noted to have been admitted over  [place] with CAP 12/21-12/24 (esophageal dilatation during this  admission). Pt was initially placed on vanc and cefepime, then  transitioned to augmentin. Was discharged on extended course of  augmentin. Pt states that she took augmentin until last week  Saturday. Pt states that she had recurrence of cough, SOB day  after augmentin was completed. Pt has had progressive cough and  SOB since this point. Has also had some rhinorrhea and nasal  congestion. Mild subjective chills at home. Also with ? Recurrent  aspiration events per daughter.  Type of Study: Modified Barium Swallowing Study Reason for Referral: Objectively evaluate swallowing function Previous Swallow Assessment: esophagram, see history Diet Prior to this Study: Dysphagia 3 (soft);Thin liquids Temperature Spikes Noted: No Respiratory Status: Room air History of Recent Intubation: No Behavior/Cognition: Alert;Cooperative;Pleasant mood Oral Cavity - Dentition: Adequate natural dentition Oral Motor / Sensory Function: Within functional limits Self-Feeding Abilities: Able to feed self Patient Positioning: Upright in chair Baseline Vocal Quality: Clear Volitional Cough: Strong Volitional Swallow: Able to elicit Anatomy:  (Cervical fusion, Zenker's - small) Pharyngeal Secretions: Not observed secondary MBS    Reason for Referral Objectively evaluate swallowing function   Oral Phase Oral Preparation/Oral Phase Oral Phase: WFL   Pharyngeal Phase Pharyngeal Phase Pharyngeal Phase: Impaired Pharyngeal - Thin Pharyngeal - Thin Cup: Reduced airway/laryngeal  closure;Pharyngeal residue - cp segment;Pharyngeal residue -  valleculae;Pharyngeal residue -  pyriform sinuses;Compensatory  strategies attempted (Comment);Penetration/Aspiration during  swallow Penetration/Aspiration details (thin cup): Material enters  airway,  remains ABOVE vocal cords then ejected out;Material does  not enter airway;Material enters airway, CONTACTS cords and not  ejected out Pharyngeal - Thin Straw: Not tested Pharyngeal - Solids Pharyngeal - Puree: Pharyngeal residue - cp segment Pharyngeal - Mechanical Soft: Pharyngeal residue - cp segment Pharyngeal - Pill: Within functional limits  Cervical Esophageal Phase    GO    Cervical Esophageal Phase Cervical Esophageal Phase: Impaired Cervical Esophageal Phase - Comment Cervical Esophageal Comment: see HPI        Herbie Baltimore, MA CCC-SLP [phone]  Lynann Beaver 07/18/2013, 4:09 PM     Microbiology: Recent Results (from the past 240 hour(s))  CULTURE, BLOOD (ROUTINE X 2)     Status: None   Collection Time    07/16/13  8:55 PM      Result Value Range Status   Specimen Description BLOOD ARM LEFT   Final   Special Requests BOTTLES DRAWN AEROBIC AND ANAEROBIC 8CC   Final   Culture  Setup Time     Final   Value: 07/17/2013 00:44     Performed at [facility]   Culture     Final   Value:        BLOOD CULTURE RECEIVED NO GROWTH TO DATE CULTURE WILL BE HELD FOR 5 DAYS BEFORE ISSUING A FINAL NEGATIVE REPORT     Performed at [facility]   Report Status PENDING   Incomplete  CULTURE, BLOOD (ROUTINE X 2)     Status: None   Collection Time    07/16/13  9:01 PM      Result Value Range Status   Specimen Description BLOOD ARM RIGHT   Final   Special Requests BOTTLES DRAWN AEROBIC ONLY [place]   Final   Culture  Setup Time     Final   Value: 07/17/2013 00:44     Performed at [facility]   Culture     Final   Value:        BLOOD CULTURE RECEIVED NO GROWTH TO DATE CULTURE WILL BE HELD FOR 5 DAYS BEFORE ISSUING A FINAL NEGATIVE REPORT     Performed at [facility]   Report Status PENDING    Incomplete     Labs: Basic Metabolic Panel:  Recent Labs Lab 07/16/13 1800 07/16/13 2055 07/18/13 0604  NA 132*  --  132*  K 4.4  --  3.7  CL 91*  --  93*  CO2 25  --  26  GLUCOSE 195*  --  197*  BUN 12  --  20  CREATININE 0.68 0.70 0.66  CALCIUM 8.7  --  8.4   Liver Function Tests:  Recent Labs Lab 07/16/13 1800  AST 17  ALT 15  ALKPHOS 84  BILITOT <0.2*  PROT 7.2  ALBUMIN 3.6   No results found for this basename: LIPASE, AMYLASE,  in the last 168 hours No results found for this basename: AMMONIA,  in the last 168 hours CBC:  Recent Labs Lab 07/16/13 1800 07/16/13 2055 07/17/13 0335 07/18/13 0604  WBC 9.1 8.5 9.0 10.2  NEUTROABS 7.3  --  8.2*  --   HGB 11.7* 10.9* 10.7* 10.1*  HCT 35.3* 33.2* 32.1* 30.0*  MCV 83.8 83.2 82.5 81.1  PLT 211 197 189 206   Cardiac Enzymes: No results found for this basename: CKTOTAL, CKMB, CKMBINDEX, TROPONINI,  in the last 168 hours BNP: BNP (last 3 results)  Recent Labs  06/15/13 1840  PROBNP 190.2  CBG:  Recent Labs Lab 07/18/13 0734 07/18/13 1152 07/18/13 1716 07/18/13 2159 07/19/13 0749  GLUCAP 209* 244* 262* 203* 153*       Signed:  Calden Dorsey A  [facility] 07/19/2013, 10:44 AM

## 2013-07-22 ENCOUNTER — Ambulatory Visit (INDEPENDENT_AMBULATORY_CARE_PROVIDER_SITE_OTHER): Payer: Medicare Other | Admitting: Adult Health

## 2013-07-22 ENCOUNTER — Encounter: Payer: Self-pay | Admitting: Adult Health

## 2013-07-22 ENCOUNTER — Ambulatory Visit (INDEPENDENT_AMBULATORY_CARE_PROVIDER_SITE_OTHER)
Admission: RE | Admit: 2013-07-22 | Discharge: 2013-07-22 | Disposition: A | Discharge status: Home / Self Care | Payer: Medicare Other | Source: Ambulatory Visit | Attending: Adult Health | Admitting: Adult Health

## 2013-07-22 VITALS — BP 122/58 | HR 97 | Temp 98.2°F | Ht 62.5 in | Wt 130.2 lb

## 2013-07-22 DIAGNOSIS — J189 Pneumonia, unspecified organism: Secondary | ICD-10-CM

## 2013-07-22 DIAGNOSIS — J310 Chronic rhinitis: Secondary | ICD-10-CM | POA: Diagnosis not present

## 2013-07-22 NOTE — Patient Instructions (Signed)
We are setting you up for a CT sinus scan .  Continue on Mucinex As needed  Congestion  Saline nasal rinses As needed   Finish Levaquin as directed.  follow up Dr. Halford Chessman  In 3 weeks with chest xray  Please contact office for sooner follow up if symptoms do not improve or worsen or seek emergency care

## 2013-07-23 LAB — CULTURE, BLOOD (ROUTINE X 2)
Culture: NO GROWTH
Culture: NO GROWTH

## 2013-07-24 NOTE — Assessment & Plan Note (Signed)
CAP clinically improving  cxr is improving will need follow up cxr for clearance on return  Still has lingering sinus symptoms despite aggressive abx - will check CT sinus before giving more abx   Plan  We are setting you up for a CT sinus scan .  Continue on Mucinex As needed  Congestion  Saline nasal rinses As needed   Finish Levaquin as directed.  follow up Dr. Halford Chessman  In 3 weeks with chest xray  Please contact office for sooner follow up if symptoms do not improve or worsen or seek emergency care

## 2013-07-24 NOTE — Progress Notes (Signed)
   Subjective:    Patient ID: [name], female    DOB: 1930-08-13, 80 y.o.   MRN: [id number]  HPI 80 yo former smoker with abnormal CT chest (probable BOOP) in the setting of emphysema on chronic prednisone, and scleroderma.  07/22/13 [facility] follow up  Returns for a post hospital follow up .  Was admitted 1/21 for Pneumonia -tx w/ aggressive abx. Discharged on [place] . Neg flu PCR.  Reports symptoms have resolved but does c/o HA, sinus pressure, green & yellow discharge, PND x2 months, worse x 4 days.  Has 2 dose of Levaquin left. Had a swallow eval with MBS-showing dysphagia  , rec Dysphagia D-3 diet for  CXR today shows slight interval improvement RUL opacities.  She denies chest pain, edema , hemoptysis, orthopnea or fever.    Review of Systems Constitutional:   No  weight loss, night sweats,  Fevers, chills,  +fatigue, or  lassitude.  HEENT:   No headaches,  Difficulty swallowing,  Tooth/dental problems, or  Sore throat,                No sneezing, itching, ear ache, + nasal congestion, post nasal drip,   CV:  No chest pain,  Orthopnea, PND, swelling in lower extremities, anasarca, dizziness, palpitations, syncope.   GI  No heartburn, indigestion, abdominal pain, nausea, vomiting, diarrhea, change in bowel habits, loss of appetite, bloody stools.   Resp:  .  No chest wall deformity  Skin: no rash or lesions.  GU: no dysuria, change in color of urine, no urgency or frequency.  No flank pain, no hematuria   MS:  No joint pain or swelling.  No decreased range of motion.  No back pain.  Psych:  No change in mood or affect. No depression or anxiety.  No memory loss.         Objective:   Physical Exam  GEN: A/Ox3; pleasant , NAD, elderly   HEENT:  [place]/AT,  EACs-clear, TMs-wnl, NOSE-clear, THROAT-clear, no lesions, no postnasal drip or exudate noted.   NECK:  Supple w/ fair ROM; no JVD; normal carotid impulses w/o bruits; no thyromegaly or nodules  palpated; no lymphadenopathy.  RESP  Diminshed BS in bases no accessory muscle use, no dullness to percussion  CARD:  RRR, no m/r/g  , no peripheral edema, pulses intact, no cyanosis or clubbing.  GI:   Soft & nt; nml bowel sounds; no organomegaly or masses detected.  Musco: Warm bil, no deformities or joint swelling noted.   Neuro: alert, no focal deficits noted.    Skin: Warm, no lesions or rashes        Assessment & Plan:

## 2013-07-29 ENCOUNTER — Inpatient Hospital Stay: Admission: RE | Admit: 2013-07-29 | Payer: Medicare Other | Source: Ambulatory Visit

## 2013-07-30 ENCOUNTER — Ambulatory Visit (INDEPENDENT_AMBULATORY_CARE_PROVIDER_SITE_OTHER)
Admission: RE | Admit: 2013-07-30 | Discharge: 2013-07-30 | Disposition: A | Discharge status: Home / Self Care | Payer: Medicare Other | Source: Ambulatory Visit | Attending: Adult Health | Admitting: Adult Health

## 2013-07-30 DIAGNOSIS — J310 Chronic rhinitis: Secondary | ICD-10-CM | POA: Diagnosis not present

## 2013-07-30 DIAGNOSIS — R05 Cough: Secondary | ICD-10-CM | POA: Diagnosis not present

## 2013-07-30 DIAGNOSIS — R059 Cough, unspecified: Secondary | ICD-10-CM | POA: Diagnosis not present

## 2013-07-31 ENCOUNTER — Telehealth: Payer: Self-pay | Admitting: Adult Health

## 2013-07-31 ENCOUNTER — Encounter: Payer: Self-pay | Admitting: Adult Health

## 2013-07-31 DIAGNOSIS — J019 Acute sinusitis, unspecified: Secondary | ICD-10-CM | POA: Insufficient documentation

## 2013-07-31 MED ORDER — AMOXICILLIN-POT CLAVULANATE 875-125 MG PO TABS: 1.0000 | ORAL_TABLET | Freq: Two times a day (BID) | ORAL | Status: DC

## 2013-07-31 NOTE — Telephone Encounter (Signed)
Called and spoke with pt and she is aware of ct results per TP.  Pt stated that she is not allergic to PCN.  Pt has taken augmentin in the past with no problems.  Pt is aware of recs from TP and she will keep her appt with VS.  Pt is aware to call our office for further recs or seek emergency care if needed.  Nothing further is needed.

## 2013-08-01 ENCOUNTER — Inpatient Hospital Stay: Admission: RE | Admit: 2013-08-01 | Payer: Medicare Other | Source: Ambulatory Visit

## 2013-08-13 ENCOUNTER — Ambulatory Visit: Payer: Medicare Other | Admitting: Pulmonary Disease

## 2013-08-21 ENCOUNTER — Ambulatory Visit: Payer: Medicare Other | Admitting: Pulmonary Disease

## 2013-08-25 ENCOUNTER — Ambulatory Visit: Payer: Medicare Other | Admitting: Adult Health

## 2013-08-26 ENCOUNTER — Ambulatory Visit (INDEPENDENT_AMBULATORY_CARE_PROVIDER_SITE_OTHER)
Admission: RE | Admit: 2013-08-26 | Discharge: 2013-08-26 | Disposition: A | Discharge status: Home / Self Care | Payer: Medicare Other | Source: Ambulatory Visit | Attending: Adult Health | Admitting: Adult Health

## 2013-08-26 ENCOUNTER — Encounter: Payer: Self-pay | Admitting: Adult Health

## 2013-08-26 ENCOUNTER — Ambulatory Visit (INDEPENDENT_AMBULATORY_CARE_PROVIDER_SITE_OTHER): Payer: Medicare Other | Admitting: Adult Health

## 2013-08-26 VITALS — BP 150/60 | HR 80 | Temp 97.4°F | Ht 62.0 in | Wt 131.6 lb

## 2013-08-26 DIAGNOSIS — J8409 Other alveolar and parieto-alveolar conditions: Secondary | ICD-10-CM

## 2013-08-26 DIAGNOSIS — J189 Pneumonia, unspecified organism: Secondary | ICD-10-CM | POA: Diagnosis not present

## 2013-08-26 DIAGNOSIS — J8489 Other specified interstitial pulmonary diseases: Secondary | ICD-10-CM

## 2013-08-26 MED ORDER — PREDNISONE 10 MG PO TABS: ORAL_TABLET | ORAL | Status: DC

## 2013-08-26 MED ORDER — MOXIFLOXACIN HCL 400 MG PO TABS: 400.0000 mg | ORAL_TABLET | Freq: Every day | ORAL | Status: DC

## 2013-08-26 NOTE — Patient Instructions (Addendum)
Avelox 400mg  daily for 10 days  Continue on Mucinex As needed  Congestion  Saline nasal rinses As needed   Start Probiotic daily -Align  Follow up Dr. Halford Chessman  In 1-2 weeks with chest xray  Please contact office for sooner follow up if symptoms do not improve or worsen or seek emergency care   Late add :  pred taper added

## 2013-08-26 NOTE — Progress Notes (Signed)
   Subjective:    Patient ID: [name], female    DOB: [birth year], 80 y.o.   MRN: [id number]  HPI  80 yo former smoker with abnormal CT chest (probable BOOP) in the setting of emphysema on chronic prednisone, and scleroderma. Poor vision -macular degeneration   [facility]  Returns for a post hospital follow up .  Was admitted 1/21 for Pneumonia -tx w/ aggressive abx. Discharged on [place] . Neg flu PCR.  Reports symptoms have resolved but does c/o HA, sinus pressure, green & yellow discharge, PND x2 months, worse x 4 days.  Has 2 dose of Levaquin left. Had a swallow eval with MBS-showing dysphagia  , rec Dysphagia D-3 diet for  CXR today shows slight interval improvement RUL opacities.  She denies chest pain, edema , hemoptysis, orthopnea or fever.  >>finish Levaquin  CT sinus + >tx 14 d of augmentin   08/26/2013 Acute OV  Returns for an acute office visit. Complains of nasal congestion x 3 wks. With worsening cough and congestion, and wheezing. Low grade temps . Most days only in evening with post nasal drip.  Follows with Dr. Ouida Sills for Scleroderma on chronic steroids -currently on prednisone 3 mg.  Hx of CVA , w/ known dysphagia , is suppose to be on D-3 diet.  Has known diverticulum in esophagus from previous neck surgery.  Patient denies any hemoptysis, chest pain, orthopnea, PND, or leg swelling  Review of Systems  Constitutional:   No  weight loss, night sweats,  Fevers, chills,  +fatigue, or  lassitude.  HEENT:   No headaches,  Difficulty swallowing,  Tooth/dental problems, or  Sore throat,                No sneezing, itching, ear ache, + nasal congestion, post nasal drip,   CV:  No chest pain,  Orthopnea, PND, swelling in lower extremities, anasarca, dizziness, palpitations, syncope.   GI  No heartburn, indigestion, abdominal pain, nausea, vomiting, diarrhea, change in bowel habits, loss of appetite, bloody stools.   Resp:  .  No chest  wall deformity  Skin: no rash or lesions.  GU: no dysuria, change in color of urine, no urgency or frequency.  No flank pain, no hematuria   MS:  No joint pain or swelling.  No decreased range of motion.  No back pain.  Psych:  No change in mood or affect. No depression or anxiety.  No memory loss.         Objective:   Physical Exam   GEN: A/Ox3; pleasant , NAD, elderly   HEENT:  [place]/AT,  EACs-clear, TMs-wnl, NOSE-clear drainage  THROAT-clear, no lesions, no postnasal drip or exudate noted.   NECK:  Supple w/ fair ROM; no JVD; normal carotid impulses w/o bruits; no thyromegaly or nodules palpated; no lymphadenopathy.  RESP  Diminshed BS in bases no accessory muscle use, no dullness to percussion  CARD:  RRR, no m/r/g  , no peripheral edema, pulses intact, no cyanosis or clubbing.  GI:   Soft & nt; nml bowel sounds; no organomegaly or masses detected.  Musco: Warm bil, no deformities or joint swelling noted.   Neuro: alert, no focal deficits noted.    Skin: Warm, no lesions or rashes   CXR 08/26/13 >Recurrent right upper lobe pneumonia; interval progressed patchy upper lobe opacity      Assessment & Plan:

## 2013-08-29 NOTE — Assessment & Plan Note (Signed)
Recurrent right sided PNA  W/ possible sinusitis ( CT sinus + 07/2013 ) in setting of known pt w/ dysphagia /hx of CVA  ? BOOP related.  Will tx w/ abx to cover PNA and sinus  Repeat cxr on return  Advised on D-3 diet .   Plan  Avelox 400mg  daily for 10 days  Continue on Mucinex As needed  Congestion  Saline nasal rinses As needed   Start Probiotic daily -Align  Follow up Dr. Halford Chessman  In 1-2 weeks with chest xray  Please contact office for sooner follow up if symptoms do not improve or worsen or seek emergency care

## 2013-08-29 NOTE — Assessment & Plan Note (Signed)
?   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 Halford Chessman   Will challenge with steroid course with close follow up and cxr on return ov with Dr. Halford Chessman

## 2013-09-02 ENCOUNTER — Telehealth: Payer: Self-pay

## 2013-09-04 NOTE — Telephone Encounter (Signed)
Patient is calling regarding the conversation that she has with you on Monday about her Ticlopidine. Please call and advise.

## 2013-09-05 ENCOUNTER — Telehealth: Payer: Self-pay

## 2013-09-05 MED ORDER — TICAGRELOR 90 MG PO TABS: 90.0000 mg | ORAL_TABLET | Freq: Two times a day (BID) | ORAL | Status: DC

## 2013-09-05 NOTE — Telephone Encounter (Signed)
Called patient to let the her to start brilinta 90 mg bid patient understand and samples were placed up front for her

## 2013-09-05 NOTE — Telephone Encounter (Signed)
Discussed with interventional  Would recomm Brilinta  Now that Ticlid is no longer being made.

## 2013-09-08 DIAGNOSIS — E109 Type 1 diabetes mellitus without complications: Secondary | ICD-10-CM | POA: Diagnosis not present

## 2013-09-08 DIAGNOSIS — E039 Hypothyroidism, unspecified: Secondary | ICD-10-CM | POA: Diagnosis not present

## 2013-09-08 DIAGNOSIS — E782 Mixed hyperlipidemia: Secondary | ICD-10-CM | POA: Diagnosis not present

## 2013-09-08 DIAGNOSIS — I10 Essential (primary) hypertension: Secondary | ICD-10-CM | POA: Diagnosis not present

## 2013-09-08 DIAGNOSIS — Z0000 Encounter for general adult medical examination without abnormal findings: Secondary | ICD-10-CM | POA: Diagnosis not present

## 2013-09-08 DIAGNOSIS — IMO0001 Reserved for inherently not codable concepts without codable children: Secondary | ICD-10-CM | POA: Diagnosis not present

## 2013-09-10 ENCOUNTER — Encounter: Payer: Self-pay | Admitting: Pulmonary Disease

## 2013-09-10 ENCOUNTER — Ambulatory Visit (INDEPENDENT_AMBULATORY_CARE_PROVIDER_SITE_OTHER)
Admission: RE | Admit: 2013-09-10 | Discharge: 2013-09-10 | Disposition: A | Discharge status: Home / Self Care | Payer: Medicare Other | Source: Ambulatory Visit | Attending: Pulmonary Disease | Admitting: Pulmonary Disease

## 2013-09-10 ENCOUNTER — Ambulatory Visit (INDEPENDENT_AMBULATORY_CARE_PROVIDER_SITE_OTHER): Payer: Medicare Other | Admitting: Pulmonary Disease

## 2013-09-10 VITALS — BP 120/62 | HR 86 | Ht 62.5 in | Wt 134.0 lb

## 2013-09-10 DIAGNOSIS — J189 Pneumonia, unspecified organism: Secondary | ICD-10-CM | POA: Diagnosis not present

## 2013-09-10 DIAGNOSIS — J8409 Other alveolar and parieto-alveolar conditions: Secondary | ICD-10-CM | POA: Diagnosis not present

## 2013-09-10 DIAGNOSIS — J438 Other emphysema: Secondary | ICD-10-CM

## 2013-09-10 DIAGNOSIS — J8489 Other specified interstitial pulmonary diseases: Secondary | ICD-10-CM

## 2013-09-10 NOTE — Assessment & Plan Note (Signed)
Clinically improved, and has improvement in CXR.  She is to continue prednisone taper down to baseline dose of 5 mg daily.

## 2013-09-10 NOTE — Assessment & Plan Note (Signed)
Former smoker with radiographic changes of emphysema.  She has not noticed benefit to inhaler therapy previously.

## 2013-09-10 NOTE — Patient Instructions (Signed)
Follow-up in 2 months with chest x-ray 

## 2013-09-10 NOTE — Progress Notes (Signed)
Chief Complaint  Patient presents with  . Follow-up    Breathing is unchanged. Report SOB, coughing. Denies chest tightness or wheezing. Had CXR today.    CC: [name]: [name] is a 80 y.o. 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  I had not seen her since 2013.  She was seen by Rexene Edison earlier this month.  She was started on antibiotics and higher dose prednisone.  She is feeling much better since being on prednisone.  She still has cough with clear sputum.  She is having less sinus congestion.  She denies fever.  She gets occasional rattle in her chest, but this is better.  This is similar to her pattern of respiratory difficulties when I last saw her in 2013.  TESTS: August 2010 >> Bronchoscopy PFT 06/16/09 >> FEV1 1.99(118%), FEV1% 73, TLC 4.60(105%), DLCO 66%, no BD  She  has a past medical history of Coronary artery disease; Dressler's syndrome; Hypertension; Dyslipidemia; Diabetes mellitus; CVA (cerebral infarction) (1991); Macular degeneration; GERD (gastroesophageal reflux disease); Diverticulosis; IBS (irritable bowel syndrome); Zenker's diverticulum; Emphysema; Pulmonary hypertension; Chronic sinusitis; Fibromyalgia; Hypothyroidism; Sjogren's disease; Raynaud's phenomenon; Cervical disc disease; Adenomatous colon polyp; Scleroderma; COPD (chronic obstructive pulmonary disease); Arthritis; Blood transfusion; Myocardial infarction (may 27th 2007); Depression; Stroke (1990); Cancer; Sleep apnea; Legally blind; CONSTIPATION, CHRONIC (06/26/2007); Pneumonia (07/16/2013); and Pneumonia.  She  has past surgical history that includes Total abdominal hysterectomy (1973); Bilateral oophorectomy (2002); Spinal fusion (1997); lobe thyroid removal; Thyroid lobectomy (1991); Cholecystectomy (1965); Appendectomy; Cataract extraction; Bronchoscopy (02-22-09);  STENTED CARDIAC  ARTERY (2007 / 2007 / 2010); Proctoscopy (03/18/2012); Colon surgery (2014); and Esophagogastroduodenoscopy (egd) with esophageal dilation (N/A, 06/18/2013).  Current Outpatient Prescriptions on File Prior to Visit  Medication Sig Dispense Refill  . amLODipine (NORVASC) 10 MG tablet Take 10 mg by mouth every morning. Once a day      . aspirin 325 MG tablet Take 325 mg by mouth 2 (two) times daily.       . beta carotene w/minerals (OCUVITE) tablet Take 1 tablet by mouth daily.      [name] esomeprazole (NEXIUM) 40 MG capsule Take 1 capsule (40 mg total) by mouth daily before breakfast.  30 capsule  6  . imipramine (TOFRANIL) 25 MG tablet Take 75 mg by mouth at bedtime.       . insulin lispro (HUMALOG) 100 UNIT/ML injection Inject 6 Units into the skin 2 (two) times daily. DO NOT SUBSTITUTE PER PATIENT REQUEST      . levothyroxine (SYNTHROID, LEVOTHROID) 125 MCG tablet Take 125 mcg by mouth every morning.       . mometasone (NASONEX) 50 MCG/ACT nasal spray Place 2 sprays into the nose daily. As needed for congestion.      . predniSONE (DELTASONE) 1 MG tablet Take 1 mg by mouth. Take 3 tablets daily by mouth.      . predniSONE (DELTASONE) 10 MG tablet 4 tabs for 4 days, then 3 tabs for 4 days, 2 tabs for 4 days, then 1 tab for 4days, then 1/2 tab and hold .  60 tablet  0  . Ticagrelor (BRILINTA) 90 MG TABS tablet Take 1 tablet (90 mg total) by mouth 2 (two) times daily.  60 tablet  6  . ticlopidine (TICLID) 250 MG tablet Take 1 tablet (250 mg total) by mouth 2 (two) times daily.  60 tablet  5  .  triazolam (HALCION) 0.25 MG tablet Take 0.25 mg by mouth at bedtime as needed. For sleep       No current facility-administered medications on file prior to visit.   Allergies  Allergen Reactions  . Lantus [Insulin Glargine]     Patient states "sulfate binder causes sinus infection/pneumonia".  . Metoprolol-Hydrochlorothiazide     REACTION: decreased bp  . Novolog [Insulin Aspart]      Patient states "sulfate binder causes sinus infection/pneumonia". Tolerates Humalog.  . Sulfonamide Derivatives Hives  . Titanium     Cervical plate - had to change for stainless steel  . Adhesive [Tape] Hives and Rash    Physical Exam:  General - no distress HEENT - stable lesion on tongue Cardiac - s1s2 regular, no murmur  Chest - scattered rhonchi, no wheeze Abd - soft, nontender  Ext - no edema, skin tightening over fingers  Neuro - normal strength  Psych - normal mood/behavior  Dg Chest 2 View  09/10/2013   CLINICAL DATA:  Followup pneumonia  EXAM: CHEST  2 VIEW  COMPARISON:  08/26/2013.  07/22/2013.  FINDINGS: Heart size is normal. Mediastinal shadows are unremarkable except for calcification of the thoracic aorta up. There is probably coronary artery calcification and/or stents. Patchy infiltrate previously seen in the right upper lobe is diminishing. This has not completely cleared. Mild scarring noted again at the lung bases. Previous thyroid surgery on the left.  IMPRESSION: Diminishing infiltrate in the right upper lobe. Not yet completely clear. Mild scarring at the lung bases.   Electronically Signed   By: Nelson Chimes M.D.   On: 09/10/2013 16:35     Assessment/Plan:  Renee Mires, MD [place] 09/10/2013, 4:48 PM Pager:  [phone]

## 2013-09-15 DIAGNOSIS — E109 Type 1 diabetes mellitus without complications: Secondary | ICD-10-CM | POA: Diagnosis not present

## 2013-09-15 DIAGNOSIS — I252 Old myocardial infarction: Secondary | ICD-10-CM | POA: Diagnosis not present

## 2013-09-15 DIAGNOSIS — E782 Mixed hyperlipidemia: Secondary | ICD-10-CM | POA: Diagnosis not present

## 2013-09-15 DIAGNOSIS — I2510 Atherosclerotic heart disease of native coronary artery without angina pectoris: Secondary | ICD-10-CM | POA: Diagnosis not present

## 2013-09-16 ENCOUNTER — Telehealth: Payer: Self-pay | Admitting: Internal Medicine

## 2013-09-16 MED ORDER — PRASUGREL HCL 10 MG PO TABS: 10.0000 mg | ORAL_TABLET | Freq: Every day | ORAL | Status: DC

## 2013-09-16 NOTE — Telephone Encounter (Signed)
Spoke with pt, she has taken plavix with problems in the past. She recently had to start brilinta because ticlid is no longer being made, she took the first dose and her tongue swelled and she developed sores in her mouth. She is no longer taking and wonders what to do next. Will discuss with dr Harrington Challenger

## 2013-09-16 NOTE — Telephone Encounter (Signed)
Discussed with dr Harrington Challenger, pt is going to try effient 10 mg once daily. Samples placed at the front desk for her to pick up. If tolerated she will need a script sent into the pharm.

## 2013-09-16 NOTE — Telephone Encounter (Signed)
New message    Need to try different medication .    Sample was sent brilinta 90 mg made tongue swollen . Sore in mouth.    Need an alternative.

## 2013-09-29 DIAGNOSIS — IMO0001 Reserved for inherently not codable concepts without codable children: Secondary | ICD-10-CM | POA: Diagnosis not present

## 2013-09-29 DIAGNOSIS — I10 Essential (primary) hypertension: Secondary | ICD-10-CM | POA: Diagnosis not present

## 2013-09-29 DIAGNOSIS — E039 Hypothyroidism, unspecified: Secondary | ICD-10-CM | POA: Diagnosis not present

## 2013-10-06 ENCOUNTER — Telehealth: Payer: Self-pay | Admitting: Internal Medicine

## 2013-10-06 NOTE — Telephone Encounter (Signed)
New problem    Pt would like a call back about her medications please.

## 2013-10-06 NOTE — Telephone Encounter (Signed)
lmtcb

## 2013-10-07 ENCOUNTER — Ambulatory Visit (INDEPENDENT_AMBULATORY_CARE_PROVIDER_SITE_OTHER)
Admission: RE | Admit: 2013-10-07 | Discharge: 2013-10-07 | Disposition: A | Discharge status: Home / Self Care | Payer: Medicare Other | Source: Ambulatory Visit | Attending: Adult Health | Admitting: Adult Health

## 2013-10-07 ENCOUNTER — Ambulatory Visit (INDEPENDENT_AMBULATORY_CARE_PROVIDER_SITE_OTHER): Payer: Medicare Other | Admitting: Adult Health

## 2013-10-07 ENCOUNTER — Encounter: Payer: Self-pay | Admitting: Adult Health

## 2013-10-07 VITALS — BP 142/60 | HR 86 | Temp 98.8°F | Ht 61.75 in | Wt 136.0 lb

## 2013-10-07 DIAGNOSIS — J189 Pneumonia, unspecified organism: Secondary | ICD-10-CM

## 2013-10-07 DIAGNOSIS — J441 Chronic obstructive pulmonary disease with (acute) exacerbation: Secondary | ICD-10-CM

## 2013-10-07 MED ORDER — PREDNISONE 10 MG PO TABS: ORAL_TABLET | ORAL | Status: DC

## 2013-10-07 MED ORDER — AMOXICILLIN-POT CLAVULANATE 875-125 MG PO TABS: 1.0000 | ORAL_TABLET | Freq: Two times a day (BID) | ORAL | Status: AC

## 2013-10-07 NOTE — Patient Instructions (Signed)
Augmentin 875mg  Twice daily  For 7 days -take w/ food.  Mucinex DM Twice daily  As needed  Cough/congestion  Prednisone taper  Chest xray today  follow up Dr. Halford Chessman  Next month as planned and As needed   Please contact office for sooner follow up if symptoms do not improve or worsen or seek emergency care

## 2013-10-07 NOTE — Progress Notes (Signed)
   Subjective:    Patient ID: [name], female    DOB: 06-Aug-1930, 80 y.o.   MRN: [id number]  HPI 80 yo former smoker with abnormal CT chest (probable BOOP) in the setting of emphysema on chronic prednisone, and scleroderma. Poor vision -macular degeneration  06/2013 swallow eval with [facility]-showing dysphagia  , rec Dysphagia D-3 diet for   CT sinus 07/2013  + >tx 14 d of augmentin  Follows with Dr. Ouida Sills for Scleroderma on chronic steroids  prednisone 5 mg .daily  Has known diverticulum in esophagus from previous neck surgery.   10/07/2013 Acute OV  Complains of  sinus infection, PND, prod cough with gray mucus, increased SOB, low grade temp x2 weeks . Taking Mucinex without much relief. She denies any hemoptysis, orthopnea, PND, or leg swelling. Last chest x-ray, one month ago showed improved clearance of right upper lobe infiltrate. Last antibiotic was Avelox one month ago  Review of Systems  Constitutional:   No  weight loss, night sweats,  Fevers, chills,  +fatigue, or  lassitude.  HEENT:   No headaches,  Difficulty swallowing,  Tooth/dental problems, or  Sore throat,                No sneezing, itching, ear ache, + nasal congestion, post nasal drip,   CV:  No chest pain,  Orthopnea, PND, swelling in lower extremities, anasarca, dizziness, palpitations, syncope.   GI  No heartburn, indigestion, abdominal pain, nausea, vomiting, diarrhea, change in bowel habits, loss of appetite, bloody stools.   Resp:  .  No chest wall deformity  Skin: no rash or lesions.  GU: no dysuria, change in color of urine, no urgency or frequency.  No flank pain, no hematuria   MS:  No joint pain or swelling.  No decreased range of motion.  No back pain.  Psych:  No change in mood or affect. No depression or anxiety.  No memory loss.         Objective:   Physical Exam   GEN: A/Ox3; pleasant , NAD, elderly   HEENT:  [place]/AT,  EACs-clear, TMs-wnl, NOSE-clear drainage  THROAT-clear, no  lesions, no postnasal drip or exudate noted.   NECK:  Supple w/ fair ROM; no JVD; normal carotid impulses w/o bruits; no thyromegaly or nodules palpated; no lymphadenopathy.  RESP  Diminshed BS in bases no accessory muscle use, no dullness to percussion  CARD:  RRR, no m/r/g  , no peripheral edema, pulses intact, no cyanosis or clubbing.  GI:   Soft & nt; nml bowel sounds; no organomegaly or masses detected.  Musco: Warm bil, no deformities or joint swelling noted.   Neuro: alert, no focal deficits noted.    Skin: Warm, no lesions or rashes       Assessment & Plan:

## 2013-10-07 NOTE — Assessment & Plan Note (Signed)
RUL infiltrate improving on last xray  Check cxr today

## 2013-10-07 NOTE — Telephone Encounter (Signed)
Follow up  ° ° °Patient calling back to speak with nurse  °

## 2013-10-07 NOTE — Assessment & Plan Note (Signed)
Flare  Check cxr   Plan  Augmentin 875mg  Twice daily  For 7 days -take w/ food.  Mucinex DM Twice daily  As needed  Cough/congestion  Prednisone taper  Chest xray today  follow up Dr. Halford Chessman  Next month as planned and As needed   Please contact office for sooner follow up if symptoms do not improve or worsen or seek emergency care

## 2013-10-07 NOTE — Telephone Encounter (Signed)
Spoke with pt, we had given her effient to try because the ticlid is no longer being made. She reports she is unable to tolerate. She reports she developed bad tremors, she at first thought it was her blood sugar but she stopped the effient and the symptoms have resolved. Will forward for dr Harrington Challenger review.

## 2013-10-08 ENCOUNTER — Telehealth: Payer: Self-pay | Admitting: Pulmonary Disease

## 2013-10-08 NOTE — Telephone Encounter (Signed)
Spoke with pt, Aware of dr ross recommendation  

## 2013-10-08 NOTE — Telephone Encounter (Signed)
She has failed all agents except ticlid Fortunately the left main stent is remote.  Only option is aspirin.

## 2013-10-08 NOTE — Progress Notes (Signed)
Reviewed and agree with assessment/plan. 

## 2013-10-09 NOTE — Telephone Encounter (Signed)
See xr results  

## 2013-10-09 NOTE — Telephone Encounter (Signed)
Notes Recorded by Melvenia Needles, NP on 10/09/2013 at 4:06 PM CXR is looking better  RUL is improved  Cont to follow  Please contact office for sooner follow up if symptoms do not improve or worsen or seek emergency care    -------------  lmtcb X1

## 2013-10-09 NOTE — Telephone Encounter (Signed)
Pt is requesting the results of her xray that was done on 10/07/13. Please advise. Thanks.

## 2013-10-10 NOTE — Telephone Encounter (Signed)
I spoke with patient about results and she verbalized understanding and had no questions 

## 2013-10-14 DIAGNOSIS — Z961 Presence of intraocular lens: Secondary | ICD-10-CM | POA: Diagnosis not present

## 2013-10-14 DIAGNOSIS — H353290 Exudative age-related macular degeneration, unspecified eye, stage unspecified: Secondary | ICD-10-CM | POA: Diagnosis not present

## 2013-11-10 ENCOUNTER — Ambulatory Visit (INDEPENDENT_AMBULATORY_CARE_PROVIDER_SITE_OTHER): Payer: Medicare Other | Admitting: Pulmonary Disease

## 2013-11-10 ENCOUNTER — Encounter: Payer: Self-pay | Admitting: Pulmonary Disease

## 2013-11-10 VITALS — BP 136/64 | HR 79 | Ht 62.5 in | Wt 135.0 lb

## 2013-11-10 DIAGNOSIS — J8409 Other alveolar and parieto-alveolar conditions: Secondary | ICD-10-CM

## 2013-11-10 DIAGNOSIS — J8489 Other specified interstitial pulmonary diseases: Secondary | ICD-10-CM

## 2013-11-10 DIAGNOSIS — M349 Systemic sclerosis, unspecified: Secondary | ICD-10-CM | POA: Diagnosis not present

## 2013-11-10 NOTE — Patient Instructions (Signed)
Follow up in 6 months 

## 2013-11-10 NOTE — Progress Notes (Signed)
Chief Complaint  Patient presents with  . Follow-up    Pt c/o increased SOB with exertion, no other complaints.  States  she "feels the best with her breathing that she has in years"/    CC: [name]  History of Present Illness: [name] is a 80 y.o. 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  She was treated with prednisone and augmentin in April for an exacerbation of bronchitis.  Rt upper lung infiltrate on CXR from March had resolved in April.  She gets winded with brisk activity.  Otherwise she feels better than she has in years.  She remains on five mg prednisone daily.  She is not having cough, wheeze, sputum, fever, sweats, or chest pain.  She does not feel her swallowing is much of an issue.  TESTS: August 2010 >> Bronchoscopy PFT 06/16/09 >> FEV1 1.99(118%), FEV1% 73, TLC 4.60(105%), DLCO 66%, no BD  She  has a past medical history of Coronary artery disease; Dressler's syndrome; Hypertension; Dyslipidemia; Diabetes mellitus; CVA (cerebral infarction) (1991); Macular degeneration; GERD (gastroesophageal reflux disease); Diverticulosis; IBS (irritable bowel syndrome); Zenker's diverticulum; Emphysema; Pulmonary hypertension; Chronic sinusitis; Fibromyalgia; Hypothyroidism; Sjogren's disease; Raynaud's phenomenon; Cervical disc disease; Adenomatous colon polyp; Scleroderma; COPD (chronic obstructive pulmonary disease); Arthritis; Blood transfusion; Myocardial infarction (may 27th 2007); Depression; Stroke (1990); Cancer; Sleep apnea; Legally blind; CONSTIPATION, CHRONIC (06/26/2007); Pneumonia (07/16/2013); and Pneumonia.  She  has past surgical history that includes Total abdominal hysterectomy (1973); Bilateral oophorectomy (2002); Spinal fusion (1997); lobe thyroid removal; Thyroid lobectomy (1991); Cholecystectomy (1965); Appendectomy; Cataract extraction; Bronchoscopy (02-22-09);  STENTED CARDIAC  ARTERY (2007 / 2007 / 2010); Proctoscopy (03/18/2012); Colon surgery (2014); and Esophagogastroduodenoscopy (egd) with esophageal dilation (N/A, 06/18/2013).  Current Outpatient Prescriptions on File Prior to Visit  Medication Sig Dispense Refill  . amLODipine (NORVASC) 10 MG tablet Take 10 mg by mouth every morning. Once a day      . aspirin 325 MG tablet Take 325 mg by mouth 2 (two) times daily.       . beta carotene w/minerals (OCUVITE) tablet Take 1 tablet by mouth daily.      [name] esomeprazole (NEXIUM) 40 MG capsule Take 1 capsule (40 mg total) by mouth daily before breakfast.  30 capsule  6  . imipramine (TOFRANIL) 25 MG tablet Take 75 mg by mouth at bedtime.       . insulin lispro (HUMALOG) 100 UNIT/ML injection Inject into the skin 2 (two) times daily. DO NOT SUBSTITUTE PER PATIENT REQUEST 6 in morning, 6 at lunch, 4 at night      . levothyroxine (SYNTHROID, LEVOTHROID) 125 MCG tablet Take 125 mcg by mouth every morning.       . mometasone (NASONEX) 50 MCG/ACT nasal spray Place 2 sprays into the nose daily. As needed for congestion.      . predniSONE (DELTASONE) 1 MG tablet Take 5 mg by mouth daily.       . triazolam (HALCION) 0.25 MG tablet Take 0.25 mg by mouth at bedtime as needed. For sleep       No current facility-administered medications on file prior to visit.   Allergies  Allergen Reactions  . Lantus [Insulin Glargine]     Patient states "sulfate binder causes sinus infection/pneumonia".  . Metoprolol-Hydrochlorothiazide     REACTION: decreased bp  . Novolog [Insulin Aspart]     Patient states "sulfate binder causes sinus infection/pneumonia". Tolerates Humalog.  [name]  Sulfonamide Derivatives Hives  . Titanium     Cervical plate - had to change for stainless steel  . Adhesive [Tape] Hives and Rash    Physical Exam:  General - no distress HEENT - stable lesion on tongue Cardiac - s1s2 regular, no murmur  Chest - scattered rhonchi, no wheeze Abd - soft,  nontender  Ext - no edema, skin tightening over fingers  Neuro - normal strength  Psych - normal mood/behavior   Assessment/Plan:  Chesley Mires, MD [place] 11/10/2013, 2:17 PM Pager:  [phone]

## 2013-11-10 NOTE — Assessment & Plan Note (Signed)
She has recurrent rt upper lobe infiltrate whenever she gets respiratory infection symptoms >> this resolves with Abx, and course of prednisone.  From pulmonary stand point she can wean off prednisone.  I have advised her to d/w Dr. Ouida Sills with rheumatology to determine if she can come completely off prednisone.

## 2013-11-10 NOTE — Assessment & Plan Note (Addendum)
She is followed by Dr. Ouida Sills with Rheumatology.

## 2013-11-13 NOTE — Progress Notes (Signed)
HPI Patient is an 80 year old with a history of BOOP and  CAD (s/p IWMI with stent ot RCA. repeat stnet to the RCA for instent restenois. She is also s/p DES to L main. Last cath In summer 2011 showed: 40% LAD lesion; 50% Lcx; small OM1 with 80% lesion; RCA with 20% instent restenosis This was stable. Pt continued on medical Rx. She was last in cardiology clnic last November SInce ssen she was treated for pneumonia with several ABX  Feeling very good now  No pred taper She got word that Ticlid is no longer available  She is intolerant to plavix  Did not tolerate Effient Hx CVA   Denies CP  Staying active.      Allergies  Allergen Reactions  . Lantus [Insulin Glargine]     Patient states "sulfate binder causes sinus infection/pneumonia".  . Metoprolol-Hydrochlorothiazide     REACTION: decreased bp  . Novolog [Insulin Aspart]     Patient states "sulfate binder causes sinus infection/pneumonia". Tolerates Humalog.  . Sulfonamide Derivatives Hives  . Titanium     Cervical plate - had to change for stainless steel  . Adhesive [Tape] Hives and Rash    Current Outpatient Prescriptions  Medication Sig Dispense Refill  . amLODipine (NORVASC) 10 MG tablet Once a day      . aspirin 325 MG tablet Take 325 mg by mouth 2 (two) times daily.       . beta carotene w/minerals (OCUVITE) tablet Take 1 tablet by mouth daily.      [name] esomeprazole (NEXIUM) 40 MG capsule Take 1 capsule (40 mg total) by mouth daily before breakfast.  30 capsule  6  . imipramine (TOFRANIL) 25 MG tablet Take 75 mg by mouth at bedtime.       . insulin lispro (HUMALOG) 100 UNIT/ML injection Inject into the skin 2 (two) times daily. DO NOT SUBSTITUTE PER PATIENT REQUEST 6 in morning, 6 at lunch, 4 at night      . levothyroxine (SYNTHROID, LEVOTHROID) 125 MCG tablet Take 125 mcg by mouth every morning.       . mometasone (NASONEX) 50 MCG/ACT nasal spray Place 2 sprays into the nose as needed.       . NON FORMULARY [place]aklee Shake  -  Drink one daily      . predniSONE (DELTASONE) 1 MG tablet Take 5 mg by mouth daily.       . triazolam (HALCION) 0.25 MG tablet Take 0.25 mg by mouth at bedtime as needed. For sleep       No current facility-administered medications for this visit.    Past Medical History  Diagnosis Date  . Coronary artery disease   . Dressler's syndrome   . Hypertension   . Dyslipidemia   . Diabetes mellitus   . CVA (cerebral infarction) 1991  . Macular degeneration   . GERD (gastroesophageal reflux disease)   . Diverticulosis   . IBS (irritable bowel syndrome)   . Zenker's diverticulum   . Emphysema   . Pulmonary hypertension   . Chronic sinusitis   . Fibromyalgia   . Hypothyroidism   . Sjogren's disease   . Raynaud's phenomenon   . Cervical disc disease   . Adenomatous colon polyp   . Scleroderma   . COPD (chronic obstructive pulmonary disease)   . Arthritis   . Blood transfusion   . Myocardial infarction may 27th 2007  . Depression   . Stroke 1990    brain stem  stroke - weakness rt hand  . Cancer     cervical cancer pre hysterectomy  . Sleep apnea     STOP BANG SCORE 5  . Legally blind   . CONSTIPATION, CHRONIC 06/26/2007    Qualifier: Diagnosis of  By: Nils Pyle CMA ([place]), Mearl Latin    . Pneumonia 07/16/2013     HX PNEUMONIA  . Pneumonia     Past Surgical History  Procedure Laterality Date  . Total abdominal hysterectomy  1973  . Bilateral oophorectomy  2002  . Spinal fusion  1997    and implantation of a plate   . Lobe thyroid removal    . Thyroid lobectomy  1991    removal of left lobe thyroid  . Cholecystectomy  1965  . Appendectomy    . Cataract extraction    . Bronchoscopy  02-22-09  . Stented cardiac  artery  2007 / 2007 / 2010    X 3 STENT  . Proctoscopy  03/18/2012    Procedure: PROCTOSCOPY;  Surgeon: Adin Hector, MD;  Location: [facility];  Service: General;  Laterality: N/A;  rigid procto  . Colon surgery  2014    colon resection  . Esophagogastroduodenoscopy  (egd) with esophageal dilation N/A 06/18/2013    Procedure: ESOPHAGOGASTRODUODENOSCOPY (EGD) WITH ESOPHAGEAL DILATION;  Surgeon: Inda Castle, MD;  Location: [facility];  Service: Endoscopy;  Laterality: N/A;    Family History  Problem Relation Age of Onset  . Other Mother 58    Died from sepsis  . Stroke Father 73    died  . Heart disease Father   . Cancer Sister     lung  . Cancer Brother     lung    History   Social History  . Marital Status: Divorced    Spouse Name: N/A    Number of Children: N/A  . Years of Education: N/A   Occupational History  . retired    Social History Main Topics  . Smoking status: Former Smoker -- 1.00 packs/day for 40 years    Types: Cigarettes    Quit date: 06/26/1988  . Smokeless tobacco: Never Used     Comment: 40 pack years  . Alcohol Use: No  . Drug Use: No  . Sexual Activity: Not on file   Other Topics Concern  . Not on file   Social History Narrative  . No narrative on file    Review of Systems:  All systems reviewed.  They are negative to the above problem except as previously stated.  Vital Signs: BP 136/43  Pulse 75  Ht 5' 2.5" (1.588 m)  Wt 133 lb 8 oz (60.555 kg)  BMI 24.01 kg/m2  Physical Exam Patient is in NAD HEENT:  Normocephalic, atraumatic. EOMI, PERRLA.  Neck: JVP is normal.  No bruits.  Lungs: clear to auscultation. No rales no wheezes.  Heart: Regular rate and rhythm. Normal S1, S2. No S3.   No significant murmurs. PMI not displaced.  Abdomen:  Supple, nontender. Normal bowel sounds. No masses. No hepatomegaly.  Extremities:   Good distal pulses throughout. No lower extremity edema.  Musculoskeletal :moving all extremities.  Neuro:   alert and oriented x3.  CN II-XII grossly intact.  Assessment and Plan:  1.  CAD  Doing well  No symptoms of angina.  I will review use of  Ticlid vs Aggrenox.  2.  HL  Not on statin  Has not tolerated in past  Will check labs today  3.  HTN  Keep on same  regimen.  Check BMET.    4.  BOOP  Doing well now

## 2013-11-14 ENCOUNTER — Ambulatory Visit: Payer: Medicare Other | Admitting: Internal Medicine

## 2013-11-14 ENCOUNTER — Encounter: Payer: Self-pay | Admitting: Internal Medicine

## 2013-11-14 ENCOUNTER — Ambulatory Visit (INDEPENDENT_AMBULATORY_CARE_PROVIDER_SITE_OTHER): Payer: Medicare Other | Admitting: Internal Medicine

## 2013-11-14 VITALS — BP 136/43 | HR 75 | Ht 62.5 in | Wt 133.5 lb

## 2013-11-14 DIAGNOSIS — E876 Hypokalemia: Secondary | ICD-10-CM | POA: Diagnosis not present

## 2013-11-14 DIAGNOSIS — D649 Anemia, unspecified: Secondary | ICD-10-CM

## 2013-11-14 DIAGNOSIS — I2510 Atherosclerotic heart disease of native coronary artery without angina pectoris: Secondary | ICD-10-CM | POA: Diagnosis not present

## 2013-11-14 LAB — LIPID PANEL
Cholesterol: 175 mg/dL (ref 0–200)
HDL: 54 mg/dL (ref >39.00)
LDL Cholesterol: 98 mg/dL (ref 0–99)
Total CHOL/HDL Ratio: 3
Triglycerides: 113 mg/dL (ref 0.0–149.0)
VLDL: 22.6 mg/dL (ref 0.0–40.0)

## 2013-11-14 LAB — CBC WITH DIFFERENTIAL/PLATELET
Basophils Absolute: 0.1 10*3/uL (ref 0.0–0.1)
Basophils Relative: 0.7 % (ref 0.0–3.0)
Eosinophils Absolute: 0.1 10*3/uL (ref 0.0–0.7)
Eosinophils Relative: 1.8 % (ref 0.0–5.0)
HCT: 34.6 % — ABNORMAL LOW (ref 36.0–46.0)
Hemoglobin: 11.2 g/dL — ABNORMAL LOW (ref 12.0–15.0)
Lymphocytes Relative: 22.8 % (ref 12.0–46.0)
Lymphs Abs: 1.8 10*3/uL (ref 0.7–4.0)
MCHC: 32.3 g/dL (ref 30.0–36.0)
MCV: 77.4 fl — ABNORMAL LOW (ref 78.0–100.0)
Monocytes Absolute: 0.7 10*3/uL (ref 0.1–1.0)
Monocytes Relative: 9.1 % (ref 3.0–12.0)
Neutro Abs: 5.2 10*3/uL (ref 1.4–7.7)
Neutrophils Relative %: 65.6 % (ref 43.0–77.0)
Platelets: 276 10*3/uL (ref 150.0–400.0)
RBC: 4.47 Mil/uL (ref 3.87–5.11)
RDW: 15.4 % (ref 11.5–15.5)
WBC: 8 10*3/uL (ref 4.0–10.5)

## 2013-11-14 LAB — BASIC METABOLIC PANEL
BUN: 20 mg/dL (ref 6–23)
CO2: 31 mEq/L (ref 19–32)
Calcium: 9.2 mg/dL (ref 8.4–10.5)
Chloride: 95 mEq/L — ABNORMAL LOW (ref 96–112)
Creatinine, Ser: 0.9 mg/dL (ref 0.4–1.2)
GFR: 63.08 mL/min (ref >60.00)
Glucose, Bld: 103 mg/dL — ABNORMAL HIGH (ref 70–99)
Potassium: 3.4 mEq/L — ABNORMAL LOW (ref 3.5–5.1)
Sodium: 134 mEq/L — ABNORMAL LOW (ref 135–145)

## 2013-11-14 NOTE — Patient Instructions (Signed)
LAB WORK TODAY; BMET, CBC W/DIFF, LIPIDS  Your physician wants you to follow-up in: [place] DR. ROSS You will receive a reminder letter in the mail two months in advance. If you don't receive a letter, please call our office to schedule the follow-up appointment.

## 2013-11-18 ENCOUNTER — Other Ambulatory Visit: Payer: Self-pay | Admitting: *Deleted

## 2013-11-18 DIAGNOSIS — I2510 Atherosclerotic heart disease of native coronary artery without angina pectoris: Secondary | ICD-10-CM

## 2013-11-18 MED ORDER — ASPIRIN EC 81 MG PO TBEC: 81.0000 mg | DELAYED_RELEASE_TABLET | Freq: Every day | ORAL | Status: AC

## 2013-11-18 MED ORDER — ASPIRIN-DIPYRIDAMOLE ER 25-200 MG PO CP12: 1.0000 | ORAL_CAPSULE | Freq: Two times a day (BID) | ORAL | Status: DC

## 2013-11-24 ENCOUNTER — Telehealth: Payer: Self-pay | Admitting: Internal Medicine

## 2013-11-24 NOTE — Telephone Encounter (Signed)
Spoke with pt, she had one episode of severe diarrhea after the fourth day. She has only been taking one tablet daily Will make dr Harrington Challenger aware

## 2013-11-24 NOTE — Telephone Encounter (Signed)
New message      Having problems with new medication-----it is causing her diarrhea

## 2013-11-26 NOTE — Telephone Encounter (Signed)
Would hold if still having diarrhe. Calll next wk re resolution.

## 2013-11-27 NOTE — Telephone Encounter (Signed)
Left message for pt to call, ? If she was going to retry the aggrenox or if she stopped the med.

## 2013-11-27 NOTE — Telephone Encounter (Signed)
Spoke with pt, she has stopped the aggrenox. She cont to have diarrhea and feels it is related to some spinach she is eating in her eggs. She is going to stop the spinach and restart the aggrenox. She will call and let us know how she is doing.

## 2013-12-03 ENCOUNTER — Ambulatory Visit: Payer: Medicare Other | Admitting: Adult Health

## 2013-12-03 ENCOUNTER — Ambulatory Visit (INDEPENDENT_AMBULATORY_CARE_PROVIDER_SITE_OTHER): Payer: Medicare Other | Admitting: Adult Health

## 2013-12-03 ENCOUNTER — Encounter: Payer: Self-pay | Admitting: Adult Health

## 2013-12-03 VITALS — BP 122/64 | HR 96 | Temp 99.7°F | Ht 62.5 in | Wt 136.0 lb

## 2013-12-03 DIAGNOSIS — I2510 Atherosclerotic heart disease of native coronary artery without angina pectoris: Secondary | ICD-10-CM | POA: Diagnosis not present

## 2013-12-03 DIAGNOSIS — J438 Other emphysema: Secondary | ICD-10-CM | POA: Diagnosis not present

## 2013-12-03 MED ORDER — CEFDINIR 300 MG PO CAPS: 300.0000 mg | ORAL_CAPSULE | Freq: Two times a day (BID) | ORAL | Status: DC

## 2013-12-03 NOTE — Progress Notes (Signed)
   Subjective:    Patient ID: [name], female    DOB: 10/02/1930, 80 y.o.   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 Poor vision -macular degeneration  06/2013 swallow eval with [facility]-showing dysphagia  , rec Dysphagia D-3 diet for   CT sinus 07/2013  + >tx 14 d of augmentin  Follows with Dr. Ouida Sills for Scleroderma on chronic steroids  prednisone 5 mg .daily  Has known diverticulum in esophagus from previous neck surgery.   12/03/2013 Acute OV  Complains of PND, sinus pressure, congestion and cough x2-3  days. Has low grade fever. Has aches and does not feels good . Congestion is getting worse. Feels she has bronchitis -"worried it will go into a PNA"  No wheezing, hemoptysis, nv/d/, abd pain, or edema.  Not taking nasonex .    Review of Systems  Constitutional:   No  weight loss, night sweats,  Fevers, chills,  +fatigue, or  lassitude.  HEENT:   No headaches,  Difficulty swallowing,  Tooth/dental problems, or  Sore throat,                No sneezing, itching, ear ache, + nasal congestion, post nasal drip,   CV:  No chest pain,  Orthopnea, PND, swelling in lower extremities, anasarca, dizziness, palpitations, syncope.   GI  No heartburn, indigestion, abdominal pain, nausea, vomiting, diarrhea, change in bowel habits, loss of appetite, bloody stools.   Resp:  .  No chest wall deformity  Skin: no rash or lesions.  GU: no dysuria, change in color of urine, no urgency or frequency.  No flank pain, no hematuria   MS:  No joint pain or swelling.  No decreased range of motion.  No back pain.  Psych:  No change in mood or affect. No depression or anxiety.  No memory loss.         Objective:   Physical Exam   GEN: A/Ox3; pleasant , NAD, elderly   HEENT:  [place]/AT,  EACs-clear, TMs-wnl, NOSE-clear drainage  THROAT-clear, no lesions, no postnasal drip or exudate  noted.   NECK:  Supple w/ fair ROM; no JVD; normal carotid impulses w/o bruits; no thyromegaly or nodules palpated; no lymphadenopathy.  RESP  Diminshed BS in bases no accessory muscle use, no dullness to percussion  CARD:  RRR, no m/r/g  , no peripheral edema, pulses intact, no cyanosis or clubbing.  GI:   Soft & nt; nml bowel sounds; no organomegaly or masses detected.  Musco: Warm bil, no deformities or joint swelling noted.   Neuro: alert, no focal deficits noted.    Skin: Warm, no lesions or rashes       Assessment & Plan:

## 2013-12-03 NOTE — Patient Instructions (Signed)
Omnicef 300mg  Twice daily  For 7 days  Mucinex DM Twice daily  As needed  Cough/congestion  Saline nasal rinses As needed   Restart Nasonex 2 puffs At bedtime   Follow up Dr. Halford Chessman  as planned and As needed   Please contact office for sooner follow up if symptoms do not improve or worsen or seek emergency care

## 2013-12-04 NOTE — Assessment & Plan Note (Signed)
COPD flare with URI/Bronchitis /AR   Plan  Omnicef 300mg  Twice daily  For 7 days  Mucinex DM Twice daily  As needed  Cough/congestion  Saline nasal rinses As needed   Restart Nasonex 2 puffs At bedtime   Follow up Dr. Halford Chessman  as planned and As needed   Please contact office for sooner follow up if symptoms do not improve or worsen or seek emergency care

## 2013-12-05 ENCOUNTER — Telehealth: Payer: Self-pay | Admitting: Pulmonary Disease

## 2013-12-05 MED ORDER — PREDNISONE 5 MG PO TABS: ORAL_TABLET | ORAL | Status: DC

## 2013-12-05 NOTE — Telephone Encounter (Signed)
Called and spoke with pt and she stated that she is not getting any better.  She was seen by TP on 12/03/13 and advised of the following:  COPD flare with URI/Bronchitis /AR  Plan  Omnicef 300mg  Twice daily For 7 days  Mucinex DM Twice daily As needed Cough/congestion  Saline nasal rinses As needed  Restart Nasonex 2 puffs At bedtime  Follow up Dr. Halford Chessman as planned and As needed  Please contact office for sooner follow up if symptoms do not improve or worsen or seek emergency care    Pt stated that she is not any better and has been in the bed for the last 2 days.  She stated that she feels that these are not working for her and would like VS recs.  Please advise. Thanks  Allergies  Allergen Reactions  . Lantus [Insulin Glargine]     Patient states "sulfate binder causes sinus infection/pneumonia".  . Metoprolol-Hydrochlorothiazide     REACTION: decreased bp  . Novolog [Insulin Aspart]     Patient states "sulfate binder causes sinus infection/pneumonia". Tolerates Humalog.  . Sulfonamide Derivatives Hives  . Titanium     Cervical plate - had to change for stainless steel  . Adhesive [Tape] Hives and Rash     Current Outpatient Prescriptions on File Prior to Visit  Medication Sig Dispense Refill  . amLODipine (NORVASC) 10 MG tablet Once a day      . aspirin EC 81 MG tablet Take 1 tablet (81 mg total) by mouth daily.  90 tablet  3  . beta carotene w/minerals (OCUVITE) tablet Take 1 tablet by mouth daily.      . cefdinir (OMNICEF) 300 MG capsule Take 1 capsule (300 mg total) by mouth 2 (two) times daily.  14 capsule  0  . dipyridamole-aspirin (AGGRENOX) 200-25 MG per 12 hr capsule Take 1 capsule by mouth 2 (two) times daily.  28 capsule  0  . esomeprazole (NEXIUM) 40 MG capsule Take 1 capsule (40 mg total) by mouth daily before breakfast.  30 capsule  6  . imipramine (TOFRANIL) 25 MG tablet Take 75 mg by mouth at bedtime.       . insulin lispro (HUMALOG) 100 UNIT/ML injection Inject  into the skin 2 (two) times daily. DO NOT SUBSTITUTE PER PATIENT REQUEST 6 in morning, 6 at lunch, 4 at night      . levothyroxine (SYNTHROID, LEVOTHROID) 125 MCG tablet Take 125 mcg by mouth every morning.       . mometasone (NASONEX) 50 MCG/ACT nasal spray Place 2 sprays into the nose as needed.       . NON FORMULARY Shaklee Shake  - Drink one daily      . predniSONE (DELTASONE) 1 MG tablet Take 5 mg by mouth daily.       . triazolam (HALCION) 0.25 MG tablet Take 0.25 mg by mouth at bedtime as needed. For sleep       No current facility-administered medications on file prior to visit.

## 2013-12-05 NOTE — Telephone Encounter (Signed)
Please send script for prednisone 5 mg pill >> 4 pills for 3 days, 3 pills for 3 days, 2 pills for 3 day, 1 pill for 3 days.  Then have her resume her usual dose of prednisone.  Dispense 30 pills with no refills.

## 2013-12-05 NOTE — Telephone Encounter (Signed)
lmomtcb x1 for pt rx sent

## 2013-12-05 NOTE — Telephone Encounter (Signed)
Pt returned call

## 2013-12-05 NOTE — Telephone Encounter (Signed)
Called spoke with pt. Aware of recs. rx sent in. Nothing further needed

## 2013-12-15 ENCOUNTER — Other Ambulatory Visit: Payer: Self-pay

## 2013-12-15 ENCOUNTER — Telehealth: Payer: Self-pay | Admitting: Internal Medicine

## 2013-12-15 ENCOUNTER — Telehealth: Payer: Self-pay | Admitting: Pulmonary Disease

## 2013-12-15 DIAGNOSIS — I2510 Atherosclerotic heart disease of native coronary artery without angina pectoris: Secondary | ICD-10-CM

## 2013-12-15 MED ORDER — ASPIRIN-DIPYRIDAMOLE ER 25-200 MG PO CP12: 1.0000 | ORAL_CAPSULE | Freq: Two times a day (BID) | ORAL | Status: DC

## 2013-12-15 NOTE — Telephone Encounter (Signed)
Pt returned call.  [name] ° °

## 2013-12-15 NOTE — Telephone Encounter (Signed)
LMTCB

## 2013-12-15 NOTE — Telephone Encounter (Signed)
Spoke with the pt  She states that she had temp of 101 this am  Having increased SOB and cough  OV with VS for tomorrow at 10 am  I advised her to seek emergent care sooner if needed

## 2013-12-15 NOTE — Telephone Encounter (Signed)
New message     Want a presc for aggrenox called to [facility].  Pt had a 2wk supply.  It is working

## 2013-12-16 ENCOUNTER — Ambulatory Visit (INDEPENDENT_AMBULATORY_CARE_PROVIDER_SITE_OTHER): Payer: Medicare Other | Admitting: Pulmonary Disease

## 2013-12-16 ENCOUNTER — Ambulatory Visit (INDEPENDENT_AMBULATORY_CARE_PROVIDER_SITE_OTHER)
Admission: RE | Admit: 2013-12-16 | Discharge: 2013-12-16 | Disposition: A | Discharge status: Home / Self Care | Payer: Medicare Other | Source: Ambulatory Visit | Attending: Pulmonary Disease | Admitting: Pulmonary Disease

## 2013-12-16 ENCOUNTER — Encounter: Payer: Self-pay | Admitting: Pulmonary Disease

## 2013-12-16 VITALS — BP 130/58 | HR 85 | Temp 98.3°F | Ht 62.5 in | Wt 132.0 lb

## 2013-12-16 DIAGNOSIS — I2510 Atherosclerotic heart disease of native coronary artery without angina pectoris: Secondary | ICD-10-CM

## 2013-12-16 DIAGNOSIS — R05 Cough: Secondary | ICD-10-CM

## 2013-12-16 DIAGNOSIS — J8489 Other specified interstitial pulmonary diseases: Secondary | ICD-10-CM

## 2013-12-16 DIAGNOSIS — R059 Cough, unspecified: Secondary | ICD-10-CM

## 2013-12-16 DIAGNOSIS — R509 Fever, unspecified: Secondary | ICD-10-CM

## 2013-12-16 DIAGNOSIS — J8409 Other alveolar and parieto-alveolar conditions: Secondary | ICD-10-CM

## 2013-12-16 MED ORDER — PREDNISONE 5 MG PO TABS: ORAL_TABLET | ORAL | Status: DC

## 2013-12-16 NOTE — Assessment & Plan Note (Signed)
Will have her resume prednisone 5 mg pills >> 4 pills for 3 days, 3 pills for 3 days, 2 pills for 3 days, then 1 pill daily until next follow up.

## 2013-12-16 NOTE — Patient Instructions (Signed)
Prednisone 5 mg pill >> 4 pills for 3 days, 3 pills for 3 days, 2 pills for 3 days, then 1 pill daily until next visit Follow up in 6 weeks with Dr. Halford Chessman or [facility]

## 2013-12-16 NOTE — Progress Notes (Signed)
Chief Complaint  Patient presents with  . ACUTE OFFICE VISIT    Pt c/o incr SOB, fever(99-100) and cough.  Completed course abx and pred taper after OV with TP 12/03/13    CC: [name]  History of Present Illness: [name] is a 80 y.o. 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  She continues to have low grade fever, cough, and sinus congestion.  She feels fatigued.  She is not sure what the color of her sputum is.  She feels achy in her chest.  These were all better when she was on prednisone.  She is not having abdominal pain or nausea.   TESTS: August 2010 >> Bronchoscopy PFT 06/16/09 >> FEV1 1.99(118%), FEV1% 73, TLC 4.60(105%), DLCO 66%, no BD  She  has a past medical history of Coronary artery disease; Dressler's syndrome; Hypertension; Dyslipidemia; Diabetes mellitus; CVA (cerebral infarction) (1991); Macular degeneration; GERD (gastroesophageal reflux disease); Diverticulosis; IBS (irritable bowel syndrome); Zenker's diverticulum; Emphysema; Pulmonary hypertension; Chronic sinusitis; Fibromyalgia; Hypothyroidism; Sjogren's disease; Raynaud's phenomenon; Cervical disc disease; Adenomatous colon polyp; Scleroderma; COPD (chronic obstructive pulmonary disease); Arthritis; Blood transfusion; Myocardial infarction (may 27th 2007); Depression; Stroke (1990); Cancer; Sleep apnea; Legally blind; CONSTIPATION, CHRONIC (06/26/2007); Pneumonia (07/16/2013); and Pneumonia.  She  has past surgical history that includes Total abdominal hysterectomy (1973); Bilateral oophorectomy (2002); Spinal fusion (1997); lobe thyroid removal; Thyroid lobectomy (1991); Cholecystectomy (1965); Appendectomy; Cataract extraction; Bronchoscopy (02-22-09); STENTED CARDIAC  ARTERY (2007 / 2007 / 2010); Proctoscopy (03/18/2012); Colon surgery (2014); and Esophagogastroduodenoscopy (egd) with esophageal dilation (N/A,  06/18/2013).  Current Outpatient Prescriptions on File Prior to Visit  Medication Sig Dispense Refill  . amLODipine (NORVASC) 10 MG tablet Once a day      . aspirin EC 81 MG tablet Take 1 tablet (81 mg total) by mouth daily.  90 tablet  3  . beta carotene w/minerals (OCUVITE) tablet Take 1 tablet by mouth daily.      [name] dipyridamole-aspirin (AGGRENOX) 200-25 MG per 12 hr capsule Take 1 capsule by mouth 2 (two) times daily.  28 capsule  0  . esomeprazole (NEXIUM) 40 MG capsule Take 1 capsule (40 mg total) by mouth daily before breakfast.  30 capsule  6  . imipramine (TOFRANIL) 25 MG tablet Take 75 mg by mouth at bedtime.       . insulin lispro (HUMALOG) 100 UNIT/ML injection Inject into the skin 2 (two) times daily. DO NOT SUBSTITUTE PER PATIENT REQUEST 6 in morning, 6 at lunch, 4 at night      . levothyroxine (SYNTHROID, LEVOTHROID) 125 MCG tablet Take 125 mcg by mouth every morning.       . mometasone (NASONEX) 50 MCG/ACT nasal spray Place 2 sprays into the nose as needed.       . NON FORMULARY Shaklee Shake  - Drink one daily      . triazolam (HALCION) 0.25 MG tablet Take 0.25 mg by mouth at bedtime as needed. For sleep       No current facility-administered medications on file prior to visit.   Allergies  Allergen Reactions  . Lantus [Insulin Glargine]     Patient states "sulfate binder causes sinus infection/pneumonia".  . Metoprolol-Hydrochlorothiazide     REACTION: decreased bp  . Novolog [Insulin Aspart]     Patient states "sulfate binder causes sinus infection/pneumonia". Tolerates Humalog.  . Sulfonamide Derivatives Hives  . Titanium     Cervical plate -  had to change for stainless steel  . Adhesive [Tape] Hives and Rash    Physical Exam:  General - no distress HEENT - stable lesion on tongue Cardiac - s1s2 regular, no murmur  Chest - scattered rhonchi, no wheeze Abd - soft, nontender  Ext - no edema, skin tightening over fingers  Neuro - normal strength  Psych -  normal mood/behavior  Dg Chest 2 View  12/16/2013   CLINICAL DATA:  Fever and cough.  EXAM: CHEST  2 VIEW  COMPARISON:  10/07/2013  FINDINGS: Heart size and pulmonary vascularity are normal. No acute infiltrates or effusions. Chronic peribronchial thickening and slight interstitial accentuation is stable. No acute osseous abnormality. Surgical clips in the left side of the lower neck, consistent with prior thyroid or parathyroid surgery.  IMPRESSION: No acute abnormality. Chronic accentuation of the interstitial markings with chronic peribronchial thickening.   Electronically Signed   By: Rozetta Nunnery M.D.   On: 12/16/2013 12:00     Assessment/Plan:  Renee Mires, MD [place] 12/16/2013, 10:42 AM Pager:  [phone]

## 2014-01-05 ENCOUNTER — Other Ambulatory Visit: Payer: Self-pay | Admitting: Pulmonary Disease

## 2014-01-05 ENCOUNTER — Other Ambulatory Visit: Payer: Self-pay | Admitting: Internal Medicine

## 2014-01-05 NOTE — Telephone Encounter (Signed)
Please send refill for prednisone 5 mg pill, one pill daily with food, dispense 30 pills with 2 refills.

## 2014-01-06 ENCOUNTER — Other Ambulatory Visit: Payer: Self-pay

## 2014-01-06 DIAGNOSIS — I2510 Atherosclerotic heart disease of native coronary artery without angina pectoris: Secondary | ICD-10-CM

## 2014-01-06 MED ORDER — ASPIRIN-DIPYRIDAMOLE ER 25-200 MG PO CP12: 1.0000 | ORAL_CAPSULE | Freq: Two times a day (BID) | ORAL | Status: DC

## 2014-01-07 MED ORDER — PREDNISONE 5 MG PO TABS: 5.0000 mg | ORAL_TABLET | Freq: Every day | ORAL | Status: DC

## 2014-01-07 NOTE — Telephone Encounter (Signed)
Rx sent to [facility] Nothing further needed.

## 2014-01-16 DIAGNOSIS — L940 Localized scleroderma [morphea]: Secondary | ICD-10-CM | POA: Diagnosis not present

## 2014-01-28 ENCOUNTER — Ambulatory Visit (INDEPENDENT_AMBULATORY_CARE_PROVIDER_SITE_OTHER): Payer: Medicare Other | Admitting: Adult Health

## 2014-01-28 ENCOUNTER — Encounter: Payer: Self-pay | Admitting: Adult Health

## 2014-01-28 VITALS — BP 128/56 | HR 73 | Temp 98.2°F | Ht 62.5 in | Wt 134.2 lb

## 2014-01-28 DIAGNOSIS — I2510 Atherosclerotic heart disease of native coronary artery without angina pectoris: Secondary | ICD-10-CM | POA: Diagnosis not present

## 2014-01-28 DIAGNOSIS — J8409 Other alveolar and parieto-alveolar conditions: Secondary | ICD-10-CM | POA: Diagnosis not present

## 2014-01-28 DIAGNOSIS — J8489 Other specified interstitial pulmonary diseases: Secondary | ICD-10-CM

## 2014-01-28 DIAGNOSIS — IMO0001 Reserved for inherently not codable concepts without codable children: Secondary | ICD-10-CM | POA: Diagnosis not present

## 2014-01-28 DIAGNOSIS — E039 Hypothyroidism, unspecified: Secondary | ICD-10-CM | POA: Diagnosis not present

## 2014-01-28 DIAGNOSIS — I10 Essential (primary) hypertension: Secondary | ICD-10-CM | POA: Diagnosis not present

## 2014-01-28 MED ORDER — PREDNISONE 5 MG PO TABS: 5.0000 mg | ORAL_TABLET | Freq: Every day | ORAL | Status: DC

## 2014-01-28 NOTE — Assessment & Plan Note (Addendum)
Recent flare now resolving after steroid burst.  Will cont prednisone 5mg  daily -chronic dose from [facility] for scleroderma.   Plan  Continue on Prednisone 5mg  daily .  Follow up Dr. Halford Chessman  In 3 months and As needed

## 2014-01-28 NOTE — Progress Notes (Signed)
   Subjective:    Patient ID: [name], female    DOB: 1930-10-24, 80 y.o.   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 Poor vision -macular degeneration  06/2013 swallow eval with [facility]-showing dysphagia  , rec Dysphagia D-3 diet for   CT sinus 07/2013  + >tx 14 d of augmentin  Follows with Dr. Ouida Sills for Scleroderma on chronic steroids  prednisone 5 mg .daily  Has known diverticulum in esophagus from previous neck surgery.   01/28/2014 Follow up  Returns for a  6 week follow up Seen last ov w/ persistent symptoms of cough and low grade fevers. Tx w/ steroid burst w/ taper to 5mg  daily .  Reports is improved but still having increased SOB and PND.   Says she is feeling much better. No flare in cough or wheezing .  Denies any wheezing, tightness, increased cough, mucus production, f/c/s/n/v/d, hemoptysis, leg swelling. Or daytime sleepinesss.     Review of Systems  Constitutional:   No  weight loss, night sweats,  Fevers, chills,  +fatigue, or  lassitude.  HEENT:   No headaches,  Difficulty swallowing,  Tooth/dental problems, or  Sore throat,                No sneezing, itching, ear ache, nasal congestion, post nasal drip,   CV:  No chest pain,  Orthopnea, PND, swelling in lower extremities, anasarca, dizziness, palpitations, syncope.   GI  No heartburn, indigestion, abdominal pain, nausea, vomiting, diarrhea, change in bowel habits, loss of appetite, bloody stools.   Resp:  .  No chest wall deformity  Skin: no rash or lesions.  GU: no dysuria, change in color of urine, no urgency or frequency.  No flank pain, no hematuria   MS:  No joint pain or swelling.  No decreased range of motion.  No back pain.  Psych:  No change in mood or affect. No depression or anxiety.  No memory loss.         Objective:   Physical Exam   GEN: A/Ox3; pleasant , NAD, elderly    HEENT:  [place]/AT,  EACs-clear, TMs-wnl, NOSE-clear drainage  THROAT-clear, no lesions, no postnasal drip or exudate noted.   NECK:  Supple w/ fair ROM; no JVD; normal carotid impulses w/o bruits; no thyromegaly or nodules palpated; no lymphadenopathy.  RESP  Diminshed BS in bases no accessory muscle use, no dullness to percussion  CARD:  RRR, no m/r/g  , no peripheral edema, pulses intact, no cyanosis or clubbing.  GI:   Soft & nt; nml bowel sounds; no organomegaly or masses detected.  Musco: Warm bil, no deformities or joint swelling noted.   Neuro: alert, no focal deficits noted.    Skin: Warm, no lesions or rashes       Assessment & Plan:

## 2014-01-28 NOTE — Addendum Note (Signed)
Addended by: Parke Poisson E on: 01/28/2014 10:44 AM   Modules accepted: Orders

## 2014-01-28 NOTE — Progress Notes (Signed)
Reviewed and agree with assessment/plan. 

## 2014-01-28 NOTE — Patient Instructions (Signed)
Continue on Prednisone 5mg  daily .  Follow up Dr. Halford Chessman  In 3 months and As needed

## 2014-03-03 DIAGNOSIS — N390 Urinary tract infection, site not specified: Secondary | ICD-10-CM | POA: Diagnosis not present

## 2014-03-03 DIAGNOSIS — R3915 Urgency of urination: Secondary | ICD-10-CM | POA: Diagnosis not present

## 2014-03-16 ENCOUNTER — Telehealth: Payer: Self-pay | Admitting: Internal Medicine

## 2014-03-16 NOTE — Telephone Encounter (Signed)
New message    Patient calling stating she  can not take aggrenox 200-25 mg due to diarrhea  .

## 2014-03-17 NOTE — Telephone Encounter (Signed)
Spoke with pt. She reports she was taking Aggrenox once daily. Has had diarrhea off and on since starting Aggrenox. Complained of diarrhea in June and was able to resume Aggrenox as she thought diarrhea was diet related.  She now reports diarrhea never completely went away and she has had off and on since June. Last week had diarrhea for 4 days. No vomiting. She stopped Aggrenox and after one week diarrhea stopped. Bowel movements now back to normal. She has not resumed Aggrenox and reports she is afraid to take due to diarrhea. Will forward to Dr. Harrington Challenger for recommendations.

## 2014-03-18 NOTE — Telephone Encounter (Signed)
I would follow off meds.   Call in one week.

## 2014-03-19 DIAGNOSIS — D649 Anemia, unspecified: Secondary | ICD-10-CM | POA: Diagnosis not present

## 2014-03-19 DIAGNOSIS — R5381 Other malaise: Secondary | ICD-10-CM | POA: Diagnosis not present

## 2014-03-19 DIAGNOSIS — R3915 Urgency of urination: Secondary | ICD-10-CM | POA: Diagnosis not present

## 2014-03-19 DIAGNOSIS — R5383 Other fatigue: Secondary | ICD-10-CM | POA: Diagnosis not present

## 2014-03-19 DIAGNOSIS — E109 Type 1 diabetes mellitus without complications: Secondary | ICD-10-CM | POA: Diagnosis not present

## 2014-03-19 NOTE — Telephone Encounter (Signed)
Advised patient. She will call back next Wed or Thurs to update off Aggrenox.

## 2014-03-25 ENCOUNTER — Telehealth: Payer: Self-pay

## 2014-03-25 NOTE — Telephone Encounter (Signed)
Returned patient's phone call to ask about symptoms.  Patient states that she has had bad diarrhea since she has been on Aggrenox.  She st that since June she has had questionable blood on toilet paper when she uses the bathroom.  She went to her PCP last week and was told she was anemic.  She has not taken her Aggrenox or ASA in over a week.  Patient has frank bloody stools and st she has been lethargic since starting Aggrenox, but other than that she is asymtomatic.  Pt to go to PCP for FU appointment tomorrow to check CBC.  Pt st she will call [facility] tomorrow to update [place], and that she just wanted Dr. Harrington Challenger to know what's going on

## 2014-03-25 NOTE — Telephone Encounter (Signed)
Returned patient's phone call to ask about symptoms. Patient states that she has had bad diarrhea since she has been on Aggrenox.   She st that since June she has had questionable blood on toilet paper when she uses the bathroom. She went to her PCP last week and was told she was anemic. She has not taken her Aggrenox or ASA in over a week. Patient has frank bloody stools and st she has been lethargic since starting Aggrenox, but other than that she is asymtomatic. Pt to go to PCP for FU appointment tomorrow to check CBC.  Pt st she will call [facility] tomorrow to update [place], and that she just wanted Dr. Harrington Challenger to know what's going on.

## 2014-03-25 NOTE — Telephone Encounter (Signed)
Need to make sure to get copy of CBC from primary care.

## 2014-03-25 NOTE — Telephone Encounter (Signed)
Follow up     Pt saw PCP and her blood work showed she was anemic. She also has blood in her stool.  She will see PCP tomorrow for a follow up on these issues.  Daughter looked up aggrenox on the computer and a side effect could be intestinal bleeding.  She is now afraid to continue taking aggrenox.  She is going to stop at least until they find out what is causing the bleeding.

## 2014-03-26 DIAGNOSIS — N342 Other urethritis: Secondary | ICD-10-CM | POA: Diagnosis not present

## 2014-03-26 DIAGNOSIS — K625 Hemorrhage of anus and rectum: Secondary | ICD-10-CM | POA: Diagnosis not present

## 2014-03-26 DIAGNOSIS — D500 Iron deficiency anemia secondary to blood loss (chronic): Secondary | ICD-10-CM | POA: Diagnosis not present

## 2014-03-31 NOTE — Telephone Encounter (Signed)
2nd call to Dr. Mathis Fare office.  Message left for medical records Lattie Haw) requesting fax of labs including CBC.

## 2014-04-01 ENCOUNTER — Encounter: Payer: Self-pay | Admitting: Internal Medicine

## 2014-04-01 DIAGNOSIS — Z1212 Encounter for screening for malignant neoplasm of rectum: Secondary | ICD-10-CM | POA: Diagnosis not present

## 2014-04-07 NOTE — Telephone Encounter (Signed)
Notes recorded from Dr. Harrington Challenger:  Labs from 10/1 sent in LDL is 81. Good Patient is mildly anemic Will need to be followed by primary MD.   [name] with patient. She has been off Aggrenox and ASA since she noticed the blood on toilet paper (see documentation below)  PCP ordered to start iron, and she sent a hemocult last week. Still looks like a little blood on the toilet paper at times.  She is waiting to hear if she should stay off of the aggrenox and asa.

## 2014-04-09 NOTE — Telephone Encounter (Signed)
Spoke with pt and gave her instructions from Dr. Harrington Challenger. I asked her to contact her primary care to see if he felt it was OK for her to take ASA 81 mg daily due to recent bleeding and anemia.  She will contact primary care and call us with response.

## 2014-04-09 NOTE — Addendum Note (Signed)
Addended by: Thompson Grayer on: 04/09/2014 09:28 AM   Modules accepted: Orders, Medications

## 2014-04-09 NOTE — Telephone Encounter (Signed)
Stay off of Aggrenox   I would like to have her take ASA 81 mg But, need to clarify bleeding Will need to have labs followed closely by primary MD [facility] stay on ASA if possible.

## 2014-04-10 ENCOUNTER — Telehealth: Payer: Self-pay | Admitting: Internal Medicine

## 2014-04-10 NOTE — Telephone Encounter (Signed)
Follow Up      Pt calling to let Nurse know that she spoke to her PCP and was told that she is allowed to take a baby Aspirin. Wanted the nurse of Dr. Harrington Challenger to be notified.

## 2014-05-14 ENCOUNTER — Ambulatory Visit (INDEPENDENT_AMBULATORY_CARE_PROVIDER_SITE_OTHER): Payer: Medicare Other | Admitting: Internal Medicine

## 2014-05-14 ENCOUNTER — Encounter: Payer: Self-pay | Admitting: Internal Medicine

## 2014-05-14 VITALS — BP 138/58 | HR 75 | Ht 62.5 in | Wt 135.0 lb

## 2014-05-14 DIAGNOSIS — I2510 Atherosclerotic heart disease of native coronary artery without angina pectoris: Secondary | ICD-10-CM | POA: Diagnosis not present

## 2014-05-14 DIAGNOSIS — I10 Essential (primary) hypertension: Secondary | ICD-10-CM

## 2014-05-14 NOTE — Progress Notes (Addendum)
HPI Patient is an 80 year old with a history of BOOP and  CAD (s/p IWMI with stent ot RCA. repeat stnet to the RCA for instent restenois. She is also s/p DES to L main. Last cath In summer 2011 showed: 40% LAD lesion; 50% Lcx; small OM1 with 80% lesion; RCA with 20% instent restenosis This was stable. Pt continued on medical Rx.Denies CP  Staying active.      She was last in clinic in the spring  Since seen she denies CP  Still having problems with blood in BM  Notes gas Is not on dipyridamole.   Allergies  Allergen Reactions  . Lantus [Insulin Glargine]     Patient states "sulfate binder causes sinus infection/pneumonia".  . Metoprolol-Hydrochlorothiazide     REACTION: decreased bp  . Novolog [Insulin Aspart]     Patient states "sulfate binder causes sinus infection/pneumonia". Tolerates Humalog.  . Sulfonamide Derivatives Hives  . Titanium     Cervical plate - had to change for stainless steel  . Adhesive [Tape] Hives and Rash    Current Outpatient Prescriptions  Medication Sig Dispense Refill  . amLODipine (NORVASC) 10 MG tablet Once a day    . aspirin EC 81 MG tablet Take 1 tablet (81 mg total) by mouth daily. 90 tablet 3  . beta carotene w/minerals (OCUVITE) tablet Take 1 tablet by mouth daily.    [name] esomeprazole (NEXIUM) 40 MG capsule Take 1 capsule (40 mg total) by mouth daily before breakfast. 30 capsule 6  . imipramine (TOFRANIL) 25 MG tablet Take 75 mg by mouth at bedtime.     . insulin lispro (HUMALOG) 100 UNIT/ML injection Inject 100 Units into the skin 3 (three) times daily with meals. DO NOT SUBSTITUTE PER PATIENT REQUEST 6 in morning, 6 at lunch, 4 at night    . levothyroxine (SYNTHROID, LEVOTHROID) 112 MCG tablet Take 112 mcg by mouth daily before breakfast.    . mometasone (NASONEX) 50 MCG/ACT nasal spray Place 2 sprays into the nose as needed.     . predniSONE (DELTASONE) 5 MG tablet Take 1 tablet (5 mg total) by mouth daily with breakfast. 30 tablet 5  . triazolam  (HALCION) 0.25 MG tablet Take 0.25 mg by mouth at bedtime as needed. For sleep     No current facility-administered medications for this visit.    Past Medical History  Diagnosis Date  . Coronary artery disease   . Dressler's syndrome   . Hypertension   . Dyslipidemia   . Diabetes mellitus   . CVA (cerebral infarction) 1991  . Macular degeneration   . GERD (gastroesophageal reflux disease)   . Diverticulosis   . IBS (irritable bowel syndrome)   . Zenker's diverticulum   . Emphysema   . Pulmonary hypertension   . Chronic sinusitis   . Fibromyalgia   . Hypothyroidism   . Sjogren's disease   . Raynaud's phenomenon   . Cervical disc disease   . Adenomatous colon polyp   . Scleroderma   . COPD (chronic obstructive pulmonary disease)   . Arthritis   . Blood transfusion   . Myocardial infarction may 27th 2007  . Depression   . 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 Cancer     cervical cancer pre hysterectomy  . Sleep apnea     STOP BANG SCORE 5  . Legally blind   . CONSTIPATION, CHRONIC 06/26/2007    Qualifier: Diagnosis of  By:  Kowalk CMA ([place]), Mearl Latin    . Pneumonia 07/16/2013     HX PNEUMONIA  . Pneumonia     Past Surgical History  Procedure Laterality Date  . Total abdominal hysterectomy  1973  . Bilateral oophorectomy  2002  . Spinal fusion  1997    and implantation of a plate   . Lobe thyroid removal    . Thyroid lobectomy  1991    removal of left lobe thyroid  . Cholecystectomy  1965  . Appendectomy    . Cataract extraction    . Bronchoscopy  02-22-09  . Stented cardiac  artery  2007 / 2007 / 2010    X 3 STENT  . Proctoscopy  03/18/2012    Procedure: PROCTOSCOPY;  Surgeon: Adin Hector, MD;  Location: [facility];  Service: General;  Laterality: N/A;  rigid procto  . Colon surgery  2014    colon resection  . Esophagogastroduodenoscopy (egd) with esophageal dilation N/A 06/18/2013    Procedure: ESOPHAGOGASTRODUODENOSCOPY (EGD) WITH  ESOPHAGEAL DILATION;  Surgeon: Inda Castle, MD;  Location: [facility];  Service: Endoscopy;  Laterality: N/A;    Family History  Problem Relation Age of Onset  . Other Mother 4    Died from sepsis  . Stroke Father 32    died  . Heart disease Father   . Cancer Sister     lung  . Cancer Brother     lung    History   Social History  . Marital Status: Divorced    Spouse Name: N/A    Number of Children: N/A  . Years of Education: N/A   Occupational History  . retired    Social History Main Topics  . Smoking status: Former Smoker -- 1.00 packs/day for 40 years    Types: Cigarettes    Quit date: 06/26/1988  . Smokeless tobacco: Never Used     Comment: 40 pack years  . Alcohol Use: No  . Drug Use: No  . Sexual Activity: Not on file   Other Topics Concern  . Not on file   Social History Narrative    Review of Systems:  All systems reviewed.  They are negative to the above problem except as previously stated.  Vital Signs: BP 138/58 mmHg  Pulse 75  Ht 5' 2.5" (1.588 m)  Wt 135 lb (61.236 kg)  BMI 24.28 kg/m2  Physical Exam Patient is in NAD HEENT:  Normocephalic, atraumatic. EOMI, PERRLA.  Neck: JVP is normal.  No bruits.  Lungs: clear to auscultation. No rales no wheezes.  Heart: Regular rate and rhythm. Normal S1, S2. No S3.   No significant murmurs. PMI not displaced.  Abdomen:  Supple, nontender. Normal bowel sounds. No masses. No hepatomegaly.  Extremities:   Good distal pulses throughout. No lower extremity edema.  Musculoskeletal :moving all extremities.  Neuro:   alert and oriented x3.  CN II-XII grossly intact.  EKG SR 75  Septal MI  Sl ST dep  V4-V6, II, AVF   Assessment and Plan:  1.  CAD  No anginal symtpoms  Only on ASA  .  2.  HL  Not on statin  Has not tolerated in past  Follow  3.  HTN  Adeaaute control    4.GI  Encouraged her to f/u in GI  Has not been seen by Dr Sharlett Iles.   4.  BOOP  Doing well now

## 2014-05-14 NOTE — Patient Instructions (Signed)
Your physician recommends that you continue on your current medications as directed. Please refer to the Current Medication list given to you today. Your physician wants you to follow-up in: 6 months with Dr. Ross.  You will receive a reminder letter in the mail two months in advance. If you don't receive a letter, please call our office to schedule the follow-up appointment.  

## 2014-05-23 ENCOUNTER — Telehealth: Payer: Self-pay | Admitting: Acute Care

## 2014-05-23 ENCOUNTER — Other Ambulatory Visit: Payer: Self-pay | Admitting: Acute Care

## 2014-05-23 NOTE — Telephone Encounter (Signed)
Pt called reporting 2 day h/o productive cough yellow sputum, subjective temp, chest tightness w/ deep breath and congestion. Stated "it feels like my pneumonia". Pt was reluctant to come in,  stating "Dr Halford Chessman doesn't want me in the hospital unless it's absolutely necessary". SHe has a h/o BOOP and sounds like has had recent flares. We agreed on the following plan of care.  1) have called in #7d levaquin and pred taper starting at 40mg . 2) she has agreed to f/u on 11/30 or earlier if her symptoms get worse.   Will ask that the office arrange a f/u for her   Erick Colace ACNP-[facility] [place] Pager # [phone] OR # [phone] if no answer

## 2014-05-25 ENCOUNTER — Ambulatory Visit: Payer: Medicare Other | Admitting: Adult Health

## 2014-05-25 ENCOUNTER — Telehealth: Payer: Self-pay | Admitting: Pulmonary Disease

## 2014-05-25 ENCOUNTER — Other Ambulatory Visit: Payer: Self-pay | Admitting: Adult Health

## 2014-05-25 DIAGNOSIS — J8489 Other specified interstitial pulmonary diseases: Secondary | ICD-10-CM

## 2014-05-25 NOTE — Telephone Encounter (Signed)
Pt aware, appt set for tomorrow at 10am with CXR. [place] Bing, CMA

## 2014-05-25 NOTE — Telephone Encounter (Signed)
She should continue current antibiotic and prednisone.  Please schedule her for CXR and ROV tomorrow >> can be with any provider since I am not in office this week.

## 2014-05-25 NOTE — Telephone Encounter (Signed)
Spoke with pt, states she began running a fever on Thursday, feeling fatigued, "horrible", no cough, chest or sinus congestion.  Believes she is developing pna.  Pete called in 7d of levaquin and pred taper on Saturday.  Pt does not feel at all better yet, is requesting recs.  Pt does not drive but states that she has someone coming to get her tomorrow to get labs done, can get a cxr if VS feels like it's needed.  Has appt on 12/11, wants to know if this should be moved up.  Dr. Halford Chessman please advise.  Thank you.

## 2014-05-26 ENCOUNTER — Ambulatory Visit (INDEPENDENT_AMBULATORY_CARE_PROVIDER_SITE_OTHER)
Admission: RE | Admit: 2014-05-26 | Discharge: 2014-05-26 | Disposition: A | Discharge status: Home / Self Care | Payer: Medicare Other | Source: Ambulatory Visit | Attending: Adult Health | Admitting: Adult Health

## 2014-05-26 ENCOUNTER — Ambulatory Visit (INDEPENDENT_AMBULATORY_CARE_PROVIDER_SITE_OTHER): Payer: Medicare Other | Admitting: Adult Health

## 2014-05-26 ENCOUNTER — Encounter: Payer: Self-pay | Admitting: Adult Health

## 2014-05-26 VITALS — BP 104/66 | HR 65 | Temp 98.8°F | Ht 62.5 in | Wt 137.2 lb

## 2014-05-26 DIAGNOSIS — D500 Iron deficiency anemia secondary to blood loss (chronic): Secondary | ICD-10-CM | POA: Diagnosis not present

## 2014-05-26 DIAGNOSIS — E782 Mixed hyperlipidemia: Secondary | ICD-10-CM | POA: Diagnosis not present

## 2014-05-26 DIAGNOSIS — J189 Pneumonia, unspecified organism: Secondary | ICD-10-CM

## 2014-05-26 DIAGNOSIS — J8489 Other specified interstitial pulmonary diseases: Secondary | ICD-10-CM | POA: Diagnosis not present

## 2014-05-26 DIAGNOSIS — I2510 Atherosclerotic heart disease of native coronary artery without angina pectoris: Secondary | ICD-10-CM | POA: Diagnosis not present

## 2014-05-26 DIAGNOSIS — R918 Other nonspecific abnormal finding of lung field: Secondary | ICD-10-CM | POA: Diagnosis not present

## 2014-05-26 NOTE — Assessment & Plan Note (Signed)
RUL CAP in immunocompromised pt on chronic steroids , BOOP and scleroderma, and known dysphagia . ? Aspiration event.  Clinically improving with abx and steroids  Will follow cxr serially for clearance   Plan  Finish Levaquin and Prednisone taper as directed.  Mucinex DM Twice daily  As needed  Cough/congestion.  Fluids and rest .  Advance diet as tolerated.  Tylenol As needed   Follow up in 2 weeks with Dr. Halford Chessman  With chest xray  Please contact office for sooner follow up if symptoms do not improve or worsen or seek emergency care

## 2014-05-26 NOTE — Progress Notes (Signed)
Reviewed and agree with assessment/plan. 

## 2014-05-26 NOTE — Progress Notes (Signed)
Subjective:    Patient ID: [name], female    DOB: May 10, 1931, 80 y.o.   MRN: [id number]  HPI  80yo former smoker with abnormal CT chest (probable BOOP) in the setting of emphysema on chronic prednisone, and scleroderma. Poor vision -macular degeneration  06/2013 swallow eval with [facility]-showing dysphagia  , rec Dysphagia D-3 diet for   CT sinus 07/2013  + >tx 14 d of augmentin  Follows with Dr. Ouida Sills for Scleroderma on chronic steroids  prednisone 5 mg .daily  Has known diverticulum in esophagus from previous neck surgery.   01/28/14 Follow up  Returns for a  6 week follow up Seen last ov w/ persistent symptoms of cough and low grade fevers. Tx w/ steroid burst w/ taper to 5mg  daily .  Reports is improved but still having increased SOB and PND.   Says she is feeling much better. No flare in cough or wheezing .  Denies any wheezing, tightness, increased cough, mucus production, f/c/s/n/v/d, hemoptysis, leg swelling. Or daytime sleepinesss.   05/26/2014 Acute OV (abnormal CT chest (probable BOOP) in the setting of emphysema on chronic prednisone, and scleroderma.) Complains of increased SOB, hacking cough, tightness in chest, fever up to 102 for 5-6 days. Was called in Levaquin 750mg  for 7 days along with steroid burst.  She is starting to feel better with less cough and dyspnea.  Fevers are better with only low grade temps .  Appetite is low but is drinking good and eating bland diet.  CXR today shows new RUL infiltrate .  She denies any chest pain, orthopnea, PND, hemoptysis, abdominal pain, nausea, vomiting, diarrhea, or confusion.  No choking or overt reflux.     Review of Systems Constitutional:   No  weight loss, night sweats,  Fevers, chills,  +fatigue, or  lassitude.  HEENT:   No headaches,  Difficulty swallowing,  Tooth/dental problems, or  Sore throat,                No sneezing, itching, ear ache,  +nasal congestion, post nasal drip,   CV:  No chest pain,   Orthopnea, PND, swelling in lower extremities, anasarca, dizziness, palpitations, syncope.   GI  No heartburn, indigestion, abdominal pain, nausea, vomiting, diarrhea, change in bowel habits, loss of appetite, bloody stools.   Resp:  .  No chest wall deformity  Skin: no rash or lesions.  GU: no dysuria, change in color of urine, no urgency or frequency.  No flank pain, no hematuria   MS:  No joint pain or swelling.  No decreased range of motion.  No back pain.  Psych:  No change in mood or affect. No depression or anxiety.  No memory loss.         Objective:   Physical Exam  GEN: A/Ox3; pleasant , NAD, elderly   HEENT:  [place]/AT,  EACs-clear, TMs-wnl, NOSE-clear drainage  THROAT-clear, no lesions, no postnasal drip or exudate noted.   NECK:  Supple w/ fair ROM; no JVD; normal carotid impulses w/o bruits; no thyromegaly or nodules palpated; no lymphadenopathy.  RESP  Diminshed BS in bases no accessory muscle use, no dullness to percussion  CARD:  RRR, no m/r/g  , no peripheral edema, pulses intact, no cyanosis or clubbing.  GI:   Soft & nt; nml bowel sounds; no organomegaly or masses detected.  Musco: Warm bil, no deformities or joint swelling noted.   Neuro: alert, no focal deficits noted.    Skin: Warm, no lesions  or rashes  CXR 05/26/2014  New onset right upper lobe infiltrate consistent with pneumonia.     Assessment & Plan:

## 2014-05-26 NOTE — Patient Instructions (Addendum)
Finish Levaquin and Prednisone taper as directed.  Mucinex DM Twice daily  As needed  Cough/congestion.  Fluids and rest .  Advance diet as tolerated.  Tylenol As needed   Follow up in 2 weeks with Dr. Halford Chessman  With chest xray  Please contact office for sooner follow up if symptoms do not improve or worsen or seek emergency care

## 2014-05-28 ENCOUNTER — Telehealth: Payer: Self-pay | Admitting: Adult Health

## 2014-05-28 NOTE — Telephone Encounter (Signed)
Discussed with TP: okay to go ahead an decrease prednisone to 2 tabs x2 days, then 1 x2 days and stop.  Called spoke with patient and discussed the above with her.  Pt happy with this recommendation and will call the office if her symptoms do not continue to improve or they worsen.  Nothing further needed; will sign off.

## 2014-05-28 NOTE — Telephone Encounter (Signed)
Called and spoke with pt and she stated that the prednisone taper that she is on is making her shaky.  She stated that she has 1 day left of the 3 tabs daily.  She is wanting to know if she can decrease the taper now.  She stated that this prednisone has never done this to her before.  TP please advise. Thanks  Allergies  Allergen Reactions  . Lantus [Insulin Glargine]     Patient states "sulfate binder causes sinus infection/pneumonia".  . Metoprolol-Hydrochlorothiazide     REACTION: decreased bp  . Novolog [Insulin Aspart]     Patient states "sulfate binder causes sinus infection/pneumonia". Tolerates Humalog.  . Sulfonamide Derivatives Hives  . Titanium     Cervical plate - had to change for stainless steel  . Adhesive [Tape] Hives and Rash    Current Outpatient Prescriptions on File Prior to Visit  Medication Sig Dispense Refill  . amLODipine (NORVASC) 10 MG tablet Once a day    . aspirin EC 81 MG tablet Take 1 tablet (81 mg total) by mouth daily. 90 tablet 3  . beta carotene w/minerals (OCUVITE) tablet Take 1 tablet by mouth daily.    [name] esomeprazole (NEXIUM) 40 MG capsule Take 1 capsule (40 mg total) by mouth daily before breakfast. 30 capsule 6  . imipramine (TOFRANIL) 25 MG tablet Take 75 mg by mouth at bedtime.     . insulin lispro (HUMALOG) 100 UNIT/ML injection Inject 100 Units into the skin 3 (three) times daily with meals. DO NOT SUBSTITUTE PER PATIENT REQUEST 6 in morning, 6 at lunch, 4 at night    . levofloxacin (LEVAQUIN) 750 MG tablet Take 1 tablet by mouth daily. x7 days    . levothyroxine (SYNTHROID, LEVOTHROID) 112 MCG tablet Take 112 mcg by mouth daily before breakfast.    . mometasone (NASONEX) 50 MCG/ACT nasal spray Place 2 sprays into the nose as needed.     . predniSONE (DELTASONE) 10 MG tablet Taper as directed    . predniSONE (DELTASONE) 5 MG tablet Take 1 tablet (5 mg total) by mouth daily with breakfast. 30 tablet 5  . triazolam (HALCION) 0.25 MG tablet Take  0.25 mg by mouth at bedtime as needed. For sleep     No current facility-administered medications on file prior to visit.

## 2014-06-02 ENCOUNTER — Telehealth: Payer: Self-pay | Admitting: Pulmonary Disease

## 2014-06-02 MED ORDER — AMOXICILLIN 500 MG PO TABS: 500.0000 mg | ORAL_TABLET | Freq: Two times a day (BID) | ORAL | Status: DC

## 2014-06-02 NOTE — Telephone Encounter (Signed)
Can send script for amoxicillin 500 mg bid, dispense 20 with no refills.

## 2014-06-02 NOTE — Telephone Encounter (Signed)
Called and spoke to pt. Pt c/o increase in SOB, PND, headache, rhinorrhea- blood tinged, clearing her throat and states her temperature was 99.3. Pt denies significant cough. Pt taking nasonex and states it has slightly helped. Pt last seen on 05/26/14 by TP.   Dr. Halford Chessman please advise.   Allergies  Allergen Reactions  . Lantus [Insulin Glargine]     Patient states "sulfate binder causes sinus infection/pneumonia".  . Metoprolol-Hydrochlorothiazide     REACTION: decreased bp  . Novolog [Insulin Aspart]     Patient states "sulfate binder causes sinus infection/pneumonia". Tolerates Humalog.  . Sulfonamide Derivatives Hives  . Titanium     Cervical plate - had to change for stainless steel  . Adhesive [Tape] Hives and Rash

## 2014-06-02 NOTE — Telephone Encounter (Signed)
Spoke with the pt and notified of recs per VS  She verbalized understanding  Nothing further needed 

## 2014-06-05 ENCOUNTER — Encounter: Payer: Self-pay | Admitting: Pulmonary Disease

## 2014-06-05 ENCOUNTER — Ambulatory Visit (INDEPENDENT_AMBULATORY_CARE_PROVIDER_SITE_OTHER): Payer: Medicare Other | Admitting: Pulmonary Disease

## 2014-06-05 ENCOUNTER — Ambulatory Visit (INDEPENDENT_AMBULATORY_CARE_PROVIDER_SITE_OTHER)
Admission: RE | Admit: 2014-06-05 | Discharge: 2014-06-05 | Disposition: A | Discharge status: Home / Self Care | Payer: Medicare Other | Source: Ambulatory Visit | Attending: Pulmonary Disease | Admitting: Pulmonary Disease

## 2014-06-05 VITALS — BP 114/60 | HR 78 | Temp 97.6°F | Ht 62.0 in | Wt 138.0 lb

## 2014-06-05 DIAGNOSIS — Z87891 Personal history of nicotine dependence: Secondary | ICD-10-CM

## 2014-06-05 DIAGNOSIS — J438 Other emphysema: Secondary | ICD-10-CM

## 2014-06-05 DIAGNOSIS — J189 Pneumonia, unspecified organism: Secondary | ICD-10-CM

## 2014-06-05 DIAGNOSIS — I2510 Atherosclerotic heart disease of native coronary artery without angina pectoris: Secondary | ICD-10-CM

## 2014-06-05 DIAGNOSIS — J019 Acute sinusitis, unspecified: Secondary | ICD-10-CM | POA: Diagnosis not present

## 2014-06-05 DIAGNOSIS — M349 Systemic sclerosis, unspecified: Secondary | ICD-10-CM

## 2014-06-05 DIAGNOSIS — R918 Other nonspecific abnormal finding of lung field: Secondary | ICD-10-CM | POA: Diagnosis not present

## 2014-06-05 NOTE — Progress Notes (Signed)
Chief Complaint  Patient presents with  . Follow-up    CXR today. Pt states that she still has SOB, chest tightness and low energy levels. Seen by TP on 12/1. Fever in afternoons.     CC: [name]  History of Present Illness: [name] is a 80 y.o. 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  She was seen by Rexene Edison after Thanksgiving.  She was feeling really bad then, and had fever up to 103F.  She was treated for pneumonia with levaquin and prednisone taper.  She feels like she has no energy, and just doesn't feel well.  She has sinus congestion.  She was started on amoxicillin yesterday.  She is not having chest pain, fever, cough, or sputum.  She is still on 10 mg prednisone per day.  TESTS: August 2010 >> Bronchoscopy PFT 06/16/09 >> FEV1 1.99(118%), FEV1% 73, TLC 4.60(105%), DLCO 66%, no BD CT chest 11/24/11 >> centrilobular emphysema, RUL ASD CT sinus 07/30/13 >> opacification of Lt maxillary sinus, rightward NSD  PMHx >> CAD, HTN, HLD, DM, CVA, Macular degeneration, GERD, Dysphagia, IBS, Depression, Fibromyalgia, Hypothyroidism, Sjogren syndrome, Raynaud's phenomenon, Scleroderma  PSHx, Medications, Allergies, Fhx, Shx reviewed.   Physical Exam: Blood pressure 114/60, pulse 78, temperature 97.6 F (36.4 C), temperature source Oral, height 5\' 2"  (1.575 m), weight 138 lb (62.596 kg), SpO2 97 %. Body mass index is 25.23 kg/(m^2).  General - no distress HEENT - stable lesion on tongue Cardiac - s1s2 regular, no murmur  Chest - scattered rhonchi, no wheeze Abd - soft, nontender  Ext - no edema, skin tightening over fingers  Neuro - normal strength  Psych - normal mood/behavior  Dg Chest 2 View  06/05/2014   CLINICAL DATA:  80 year old female with shortness of Breath. Community acquired pneumonia. Subsequent encounter.  EXAM: CHEST  2 VIEW  COMPARISON:  05/26/2014 and  earlier.  FINDINGS: Stable lung volumes. Stable cardiac size and mediastinal contours. No pneumothorax, pulmonary edema, consolidation or definite effusion. Regressed but not completely resolved right upper lobe patchy opacity in keeping with pneumonia with treatment response. No new pulmonary opacity identified. No acute osseous abnormality identified.  IMPRESSION: 1. Significantly regressed but not completely resolved patchy opacity in the right upper lobe consistent with regression of pneumonia. Recommend 1 additional radiographic followup to document complete resolution. 2. No new cardiopulmonary abnormality.   Electronically Signed   By: Lars Pinks M.D.   On: 06/05/2014 16:35     Assessment/Plan:  Recurrent Rt upper lung infiltrate >> either PNA or BOOP.  CXR looks improved compared to 05/26/14. Plan: - continue 10 mg prednisone - if clinically improved at next visit, will then need to consider repeating CT chest  Acute sinusitis >> I think her current symptoms are related to this. Plan: - complete course of amoxicillin  Hx of tobacco abuse with emphysema >> she has not noticed benefit from previous trials of inhaler therapy. Plan: - monitor clinically  Hx of Scleroderma. Plan: - f/u with rheumatology  Renee Mires, MD [place] 06/05/2014, 4:56 PM Pager:  [phone]

## 2014-06-05 NOTE — Patient Instructions (Signed)
Follow up with Dr. Halford Chessman or Tammy Parrett in 4 weeks with a chest xray

## 2014-06-08 DIAGNOSIS — E782 Mixed hyperlipidemia: Secondary | ICD-10-CM | POA: Diagnosis not present

## 2014-06-08 DIAGNOSIS — D500 Iron deficiency anemia secondary to blood loss (chronic): Secondary | ICD-10-CM | POA: Diagnosis not present

## 2014-06-08 DIAGNOSIS — I2510 Atherosclerotic heart disease of native coronary artery without angina pectoris: Secondary | ICD-10-CM | POA: Diagnosis not present

## 2014-06-24 ENCOUNTER — Telehealth: Payer: Self-pay | Admitting: Pulmonary Disease

## 2014-06-24 MED ORDER — AMOXICILLIN 500 MG PO CAPS: 500.0000 mg | ORAL_CAPSULE | Freq: Two times a day (BID) | ORAL | Status: DC

## 2014-06-24 NOTE — Telephone Encounter (Signed)
Called and spoke to pt. Pt stated she completed the amox that was given to her on 06/02/14 and was feeling better but 2 days after the finished the abx she started feeling bad again. Pt c/o headache, head congestion and pressure, PND causing pt to clear throat frequently, increase in SOB and temperature of 99.9 x 2 days. Pt last seen on 06/05/14 by VS.   Dr. Halford Chessman please advise.   Allergies  Allergen Reactions  . Lantus [Insulin Glargine]     Patient states "sulfate binder causes sinus infection/pneumonia".  . Metoprolol-Hydrochlorothiazide     REACTION: decreased bp  . Novolog [Insulin Aspart]     Patient states "sulfate binder causes sinus infection/pneumonia". Tolerates Humalog.  . Sulfonamide Derivatives Hives  . Titanium     Cervical plate - had to change for stainless steel  . Adhesive [Tape] Hives and Rash

## 2014-06-24 NOTE — Telephone Encounter (Signed)
Called and spoke to pt. Informed pt of the recs per VS. Rx sent to preferred pharmacy. Pt verbalized understanding. Nothing further needed at this time.

## 2014-06-24 NOTE — Telephone Encounter (Signed)
Please send refill for amoxicillin 500 mg, one pill bid for 7 days.  She should take an additional week of antibiotics >> if not full recovered, then she should refill script for additional 1 week of antibiotics.  Dispense 14 pills with 1 refill.

## 2014-07-03 ENCOUNTER — Ambulatory Visit: Payer: Medicare Other | Admitting: Adult Health

## 2014-07-03 ENCOUNTER — Ambulatory Visit (INDEPENDENT_AMBULATORY_CARE_PROVIDER_SITE_OTHER)
Admission: RE | Admit: 2014-07-03 | Discharge: 2014-07-03 | Disposition: A | Discharge status: Home / Self Care | Payer: Medicare Other | Source: Ambulatory Visit | Attending: Adult Health | Admitting: Adult Health

## 2014-07-03 ENCOUNTER — Ambulatory Visit (INDEPENDENT_AMBULATORY_CARE_PROVIDER_SITE_OTHER): Payer: Medicare Other | Admitting: Adult Health

## 2014-07-03 ENCOUNTER — Encounter: Payer: Self-pay | Admitting: Adult Health

## 2014-07-03 VITALS — BP 136/64 | HR 86 | Temp 98.2°F | Ht 62.0 in | Wt 140.0 lb

## 2014-07-03 DIAGNOSIS — R0602 Shortness of breath: Secondary | ICD-10-CM | POA: Diagnosis not present

## 2014-07-03 DIAGNOSIS — J310 Chronic rhinitis: Secondary | ICD-10-CM

## 2014-07-03 DIAGNOSIS — J8489 Other specified interstitial pulmonary diseases: Secondary | ICD-10-CM

## 2014-07-03 DIAGNOSIS — J189 Pneumonia, unspecified organism: Secondary | ICD-10-CM

## 2014-07-03 NOTE — Progress Notes (Signed)
   Subjective:    Patient ID: [name], female    DOB: [birth year], 81 y.o.   MRN: [id number]  HPI  81yo former smoker with abnormal CT chest (probable BOOP) in the setting of emphysema on chronic prednisone, and scleroderma. Poor vision -macular degeneration  06/2013 swallow eval with [facility]-showing dysphagia  , rec Dysphagia D-3 diet for   CT sinus 07/2013  + >tx 14 d of augmentin  Follows with Dr. Ouida Sills for Scleroderma on chronic steroids  prednisone 5 mg .daily  Has known diverticulum in esophagus from previous neck surgery.    07/03/14  Follow up (abnormal CT chest (probable BOOP) in the setting of emphysema on chronic prednisone, and scleroderma.) Patient returns for a follow-up visit for pneumonia. Last visit. Chest x-ray showed a right upper lobe infiltrate. Was treated with Levaquin Patient returns today improved, however, continues to be weak. Feels that she is about 50% better. She denies any hemoptysis, orthopnea, PND or leg swelling Does complain of nasal drainage.   Review of Systems Constitutional:   No  weight loss, night sweats,  Fevers, chills,  +fatigue, or  lassitude.  HEENT:   No headaches,  Difficulty swallowing,  Tooth/dental problems, or  Sore throat,                No sneezing, itching, ear ache,  +nasal congestion, post nasal drip,   CV:  No chest pain,  Orthopnea, PND, swelling in lower extremities, anasarca, dizziness, palpitations, syncope.   GI  No heartburn, indigestion, abdominal pain, nausea, vomiting, diarrhea, change in bowel habits, loss of appetite, bloody stools.   Resp:  .  No chest wall deformity  Skin: no rash or lesions.  GU: no dysuria, change in color of urine, no urgency or frequency.  No flank pain, no hematuria   MS:  No joint pain or swelling.  No decreased range of motion.  No back pain.  Psych:  No change in mood or affect. No depression or anxiety.  No memory loss.         Objective:   Physical Exam  GEN: A/Ox3;  pleasant , NAD, elderly   HEENT:  [place]/AT,  EACs-clear, TMs-wnl, NOSE-clear drainage  THROAT-clear, no lesions, no postnasal drip or exudate noted.   NECK:  Supple w/ fair ROM; no JVD; normal carotid impulses w/o bruits; no thyromegaly or nodules palpated; no lymphadenopathy.  RESP  Diminshed BS in bases no accessory muscle use, no dullness to percussion  CARD:  RRR, no m/r/g  , no peripheral edema, pulses intact, no cyanosis or clubbing.  GI:   Soft & nt; nml bowel sounds; no organomegaly or masses detected.  Musco: Warm bil, no deformities or joint swelling noted.   Neuro: alert, no focal deficits noted.    Skin: Warm, no lesions or rashes  CXR 07/03/14 >No active cardiopulmonary disease.  Independently reviewed the study/image     Assessment & Plan:

## 2014-07-03 NOTE — Patient Instructions (Addendum)
Continue on Prednisone 5mg  daily .  Try claritin 10mg  At bedtime  For drainage  Follow up Dr. Halford Chessman  In 2 months and As needed

## 2014-07-07 ENCOUNTER — Ambulatory Visit: Payer: Medicare Other | Admitting: Adult Health

## 2014-07-14 NOTE — Assessment & Plan Note (Signed)
Improved  Continue on prednisone 5 mg daily

## 2014-07-14 NOTE — Assessment & Plan Note (Signed)
Flare  Plan Add Claritin daily as needed

## 2014-07-14 NOTE — Assessment & Plan Note (Signed)
Clinically improved CXR shows cleared infiltrate  Plan  Continue on Prednisone 5mg  daily .  Try claritin 10mg  At bedtime  For drainage  Follow up Dr. Halford Chessman  In 2 months and As needed

## 2014-07-15 DIAGNOSIS — I10 Essential (primary) hypertension: Secondary | ICD-10-CM | POA: Diagnosis not present

## 2014-07-15 DIAGNOSIS — E119 Type 2 diabetes mellitus without complications: Secondary | ICD-10-CM | POA: Diagnosis not present

## 2014-07-15 DIAGNOSIS — J189 Pneumonia, unspecified organism: Secondary | ICD-10-CM | POA: Diagnosis not present

## 2014-07-15 DIAGNOSIS — E039 Hypothyroidism, unspecified: Secondary | ICD-10-CM | POA: Diagnosis not present

## 2014-07-22 DIAGNOSIS — M349 Systemic sclerosis, unspecified: Secondary | ICD-10-CM | POA: Diagnosis not present

## 2014-09-01 ENCOUNTER — Encounter: Payer: Self-pay | Admitting: Pulmonary Disease

## 2014-09-01 ENCOUNTER — Ambulatory Visit (INDEPENDENT_AMBULATORY_CARE_PROVIDER_SITE_OTHER): Payer: Medicare Other | Admitting: Pulmonary Disease

## 2014-09-01 VITALS — BP 126/64 | HR 63 | Temp 97.3°F | Ht 62.0 in | Wt 136.2 lb

## 2014-09-01 DIAGNOSIS — J209 Acute bronchitis, unspecified: Secondary | ICD-10-CM | POA: Diagnosis not present

## 2014-09-01 MED ORDER — LEVOFLOXACIN 750 MG PO TABS: 750.0000 mg | ORAL_TABLET | Freq: Every day | ORAL | Status: DC

## 2014-09-01 NOTE — Assessment & Plan Note (Signed)
The patient's history is most suggestive of acute bronchitis, although I cannot exclude a developing pneumonia. Obviously, the patient's immune system is abnormal with her underlying medical issues and chronic prednisone. I would like to treat her with a one-week course of Levaquin for her bronchitis/possible pneumonia. She is to let us know if she is not returning to baseline.

## 2014-09-01 NOTE — Progress Notes (Signed)
   Subjective:    Patient ID: [name], female    DOB: 08/23/1930, 81 y.o.   MRN: [id number]  HPI The patient comes in today for an acute sick visit. She is normally followed by Dr. Halford Chessman for BOOP, as well as recurrent pulmonary infections probably related to chronic immunosuppression. She gives a 3 day history of increasing chest congestion, cough with mucus, as well as increased shortness of breath. She has had increasing malaise, and a temperature up to 99.9.   Review of Systems  Constitutional: Negative for fever and unexpected weight change.  HENT: Positive for congestion and postnasal drip. Negative for dental problem, ear pain, nosebleeds, rhinorrhea, sinus pressure, sneezing, sore throat and trouble swallowing.   Eyes: Negative for redness and itching.  Respiratory: Positive for cough, chest tightness and shortness of breath. Negative for wheezing.   Cardiovascular: Negative for palpitations and leg swelling.  Gastrointestinal: Negative for nausea and vomiting.  Genitourinary: Negative for dysuria.  Musculoskeletal: Negative for joint swelling.  Skin: Negative for rash.  Neurological: Negative for headaches.  Hematological: Does not bruise/bleed easily.  Psychiatric/Behavioral: Negative for dysphoric mood. The patient is not nervous/anxious.        Objective:   Physical Exam Well-developed female in no acute distress Nose without purulence or discharge noted Neck without lymphadenopathy or thyromegaly Chest with mildly decreased breath sounds, but no wheezes or crackles Cardiac exam with regular rate and rhythm Lower extremities without edema, no cyanosis Alert and oriented, moves all 4 extremities.       Assessment & Plan:

## 2014-09-01 NOTE — Patient Instructions (Signed)
Will treat with levaquin 750mg  one a day for 7 days. Please call if you are not getting better. Keep followup with Dr. Halford Chessman

## 2014-09-22 ENCOUNTER — Other Ambulatory Visit: Payer: Self-pay | Admitting: Pulmonary Disease

## 2014-09-22 MED ORDER — PREDNISONE 5 MG PO TABS: 5.0000 mg | ORAL_TABLET | Freq: Every day | ORAL | Status: DC

## 2014-09-22 NOTE — Telephone Encounter (Signed)
Received faxed refill request from [facility] for prednisone 10mg  taper that was last dispensed on 11.28.15 by Laurey Arrow NP for sick call after hours. Pt is on daily 5mg  prednisone that was last refilled 8.5.15 #30 w/ 5 refills Have refilled this dosage and faxed denial of pred taper to back to [facility] Will sign off

## 2014-09-28 DIAGNOSIS — E119 Type 2 diabetes mellitus without complications: Secondary | ICD-10-CM | POA: Diagnosis not present

## 2014-09-28 DIAGNOSIS — E039 Hypothyroidism, unspecified: Secondary | ICD-10-CM | POA: Diagnosis not present

## 2014-10-01 DIAGNOSIS — J309 Allergic rhinitis, unspecified: Secondary | ICD-10-CM | POA: Diagnosis not present

## 2014-10-01 DIAGNOSIS — J302 Other seasonal allergic rhinitis: Secondary | ICD-10-CM | POA: Diagnosis not present

## 2014-10-09 DIAGNOSIS — Z1389 Encounter for screening for other disorder: Secondary | ICD-10-CM | POA: Diagnosis not present

## 2014-10-09 DIAGNOSIS — E782 Mixed hyperlipidemia: Secondary | ICD-10-CM | POA: Diagnosis not present

## 2014-10-09 DIAGNOSIS — E109 Type 1 diabetes mellitus without complications: Secondary | ICD-10-CM | POA: Diagnosis not present

## 2014-10-09 DIAGNOSIS — Z0000 Encounter for general adult medical examination without abnormal findings: Secondary | ICD-10-CM | POA: Diagnosis not present

## 2014-10-13 DIAGNOSIS — M129 Arthropathy, unspecified: Secondary | ICD-10-CM | POA: Diagnosis not present

## 2014-10-13 DIAGNOSIS — M5412 Radiculopathy, cervical region: Secondary | ICD-10-CM | POA: Diagnosis not present

## 2014-10-13 DIAGNOSIS — M542 Cervicalgia: Secondary | ICD-10-CM | POA: Diagnosis not present

## 2014-10-13 DIAGNOSIS — M545 Low back pain: Secondary | ICD-10-CM | POA: Diagnosis not present

## 2014-10-13 DIAGNOSIS — Z6826 Body mass index (BMI) 26.0-26.9, adult: Secondary | ICD-10-CM | POA: Diagnosis not present

## 2014-10-13 DIAGNOSIS — M5416 Radiculopathy, lumbar region: Secondary | ICD-10-CM | POA: Diagnosis not present

## 2014-10-14 ENCOUNTER — Other Ambulatory Visit: Payer: Self-pay | Admitting: Neurosurgery

## 2014-10-14 DIAGNOSIS — M19019 Primary osteoarthritis, unspecified shoulder: Secondary | ICD-10-CM

## 2014-10-14 DIAGNOSIS — M5412 Radiculopathy, cervical region: Secondary | ICD-10-CM

## 2014-10-19 ENCOUNTER — Encounter: Payer: Self-pay | Admitting: Gastroenterology

## 2014-10-19 ENCOUNTER — Telehealth: Payer: Self-pay | Admitting: Pulmonary Disease

## 2014-10-19 NOTE — Telephone Encounter (Signed)
Spoke with pt and advised that message has been sent to Dr Halford Chessman and we are waiting for his response.  Pt verbalized understanding.

## 2014-10-19 NOTE — Telephone Encounter (Signed)
Spoke with pt. States that she has a sinus infection. PCP treated her with Keflex. This has not been helping. Reports coughing, SOB, chest tightness and fever. Mucus is think and yellow. Fever has been running at 100.7. Would like something sent in.  VS - please advise. Thanks.

## 2014-10-19 NOTE — Telephone Encounter (Signed)
Received word from [facility] with patient on the phone angry stating that she had not heard back from our office today concerning her sick message Spoke with patient and reminded her that we have indeed spoken with her twice today and verified the symptoms that she delivered to [place] earlier this morning.  Pt added that she finished the Keflex from her PCP yesterday and is still wheezing with fever at 101 this morning upon waking - she is concerned about a recurrence of her PNA.  Apologized to patient for any confusion and offered appointment tomorrow.  VS is in the office but has no openings until Thursday 4/28 so appt was scheduled with [facility] tomorrow 4/26 at 12N.  Pt okay with this appt date/time.  Nothing further needed; will sign off.

## 2014-10-19 NOTE — Telephone Encounter (Signed)
Disregard documentation below - nothing further is needed.

## 2014-10-19 NOTE — Telephone Encounter (Signed)
VS not on schedule. Please advise MW thanks

## 2014-10-19 NOTE — Telephone Encounter (Signed)
Pt states she has not heard from nurse today. She wants to make sure she has not been forgotten.  Please call pt to advise

## 2014-10-20 ENCOUNTER — Ambulatory Visit (INDEPENDENT_AMBULATORY_CARE_PROVIDER_SITE_OTHER)
Admission: RE | Admit: 2014-10-20 | Discharge: 2014-10-20 | Disposition: A | Discharge status: Home / Self Care | Payer: Medicare Other | Source: Ambulatory Visit | Attending: Pulmonary Disease | Admitting: Pulmonary Disease

## 2014-10-20 ENCOUNTER — Encounter: Payer: Self-pay | Admitting: Pulmonary Disease

## 2014-10-20 ENCOUNTER — Ambulatory Visit (INDEPENDENT_AMBULATORY_CARE_PROVIDER_SITE_OTHER): Payer: Medicare Other | Admitting: Pulmonary Disease

## 2014-10-20 VITALS — BP 142/60 | HR 85 | Temp 98.6°F | Ht 62.5 in | Wt 134.2 lb

## 2014-10-20 DIAGNOSIS — J438 Other emphysema: Secondary | ICD-10-CM | POA: Diagnosis not present

## 2014-10-20 DIAGNOSIS — Z87891 Personal history of nicotine dependence: Secondary | ICD-10-CM

## 2014-10-20 DIAGNOSIS — J209 Acute bronchitis, unspecified: Secondary | ICD-10-CM

## 2014-10-20 DIAGNOSIS — R05 Cough: Secondary | ICD-10-CM | POA: Diagnosis not present

## 2014-10-20 DIAGNOSIS — J019 Acute sinusitis, unspecified: Secondary | ICD-10-CM | POA: Diagnosis not present

## 2014-10-20 DIAGNOSIS — J8489 Other specified interstitial pulmonary diseases: Secondary | ICD-10-CM | POA: Diagnosis not present

## 2014-10-20 DIAGNOSIS — R509 Fever, unspecified: Secondary | ICD-10-CM

## 2014-10-20 DIAGNOSIS — M349 Systemic sclerosis, unspecified: Secondary | ICD-10-CM

## 2014-10-20 DIAGNOSIS — R0602 Shortness of breath: Secondary | ICD-10-CM | POA: Diagnosis not present

## 2014-10-20 MED ORDER — LEVOFLOXACIN 500 MG PO TABS: 500.0000 mg | ORAL_TABLET | Freq: Every day | ORAL | Status: DC

## 2014-10-20 NOTE — Patient Instructions (Signed)
Chest xray today Will get lab work results from Dr. Ashby Dawes Levaquin 500 mg daily for 7 days >> if not better, then take another 7 days of levaquin Follow up with Dr. Halford Chessman on May 9 as already scheduled

## 2014-10-20 NOTE — Progress Notes (Signed)
Chief Complaint  Patient presents with  . Acute Visit    Pt c/o sinus infection x 2 months- treated with 2 rounds abx. Pt feels like she has PNA- c/o cough, chest tightness, mucus production with thick yellow mucus, fever (101) and SOB. Pt denies chest pain.     CC: [name]  History of Present Illness: [name] is a 81 y.o. 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  She was seen by Dr. Gwenette Greet in March and tx with levaquin.  This helped her chest but not her sinuses.  She was only on levaquin for 1 week.  She still has trouble with her sinuses.  She was given amoxicillin and then keflex by her PCP.  These have not helped.  She feels pressure around her sinuses and forehead.  She has fever up to 101F.  She has been feeling more SOB and has cough with yellow sputum.    She denies sick exposures.  She denies chest pain, nausea, abdominal pain, diarrhea, rash, or swelling.  She remains on 5 mg prednisone daily.  TESTS: August 2010 >> Bronchoscopy PFT 06/16/09 >> FEV1 1.99(118%), FEV1% 73, TLC 4.60(105%), DLCO 66%, no BD CT chest 11/24/11 >> centrilobular emphysema, RUL ASD CT sinus 07/30/13 >> opacification of Lt maxillary sinus, rightward NSD  PMHx >> CAD, HTN, HLD, DM, CVA, Macular degeneration, GERD, Dysphagia, IBS, Depression, Fibromyalgia, Hypothyroidism, Sjogren syndrome, Raynaud's phenomenon, Scleroderma  PSHx, Medications, Allergies, Fhx, Shx reviewed.   Physical Exam: Blood pressure 142/60, pulse 85, temperature 98.6 F (37 C), temperature source Oral, height 5' 2.5" (1.588 m), weight 134 lb 3.2 oz (60.873 kg), SpO2 96 %. Body mass index is 24.14 kg/(m^2).  General - no distress HEENT - stable lesion on tongue Cardiac - s1s2 regular, no murmur  Chest - scattered rhonchi, no wheeze Abd - soft, nontender  Ext - no edema, skin tightening over fingers  Neuro - normal strength   Psych - normal mood/behavior  Assessment/Plan:  Recurrent Rt upper lung infiltrate >> either PNA or BOOP.  Concern she might be having recurrent PNA now. Plan: - continue 5 mg prednisone - chest xray today - will get copy of lab work from her PCP - levaquin  Acute sinusitis >> I think her current symptoms are related to this. Plan: - give 1 week of levaquin >> if not imrpoved, then take an additional 1 week of levaquin - nasonex - she might need evaluation by ENT  Hx of tobacco abuse with emphysema >> she has not noticed benefit from previous trials of inhaler therapy. Plan: - monitor clinically  Hx of Scleroderma. Plan: - f/u with rheumatology >> she will need to find new rheumatologist as Dr. Ouida Sills is retiring from clinical practice   Chesley Mires, MD [place] 10/20/2014, 11:56 AM Pager:  [phone]

## 2014-10-21 DIAGNOSIS — I10 Essential (primary) hypertension: Secondary | ICD-10-CM | POA: Diagnosis not present

## 2014-10-21 DIAGNOSIS — I2510 Atherosclerotic heart disease of native coronary artery without angina pectoris: Secondary | ICD-10-CM | POA: Diagnosis not present

## 2014-10-21 DIAGNOSIS — E039 Hypothyroidism, unspecified: Secondary | ICD-10-CM | POA: Diagnosis not present

## 2014-10-21 DIAGNOSIS — E782 Mixed hyperlipidemia: Secondary | ICD-10-CM | POA: Diagnosis not present

## 2014-10-23 ENCOUNTER — Telehealth: Payer: Self-pay | Admitting: Pulmonary Disease

## 2014-10-23 MED ORDER — PREDNISONE 5 MG PO TABS: 5.0000 mg | ORAL_TABLET | ORAL | Status: DC

## 2014-10-23 NOTE — Telephone Encounter (Signed)
Called and spoke with pt - aware of rec's for med change by Dr Halford Chessman.  Pt requests that I send another Rx for Pred 5mg  tabs to pharmacy.  Aware that this has been sent. If any issues, contact our office. Nothing further needed.

## 2014-10-23 NOTE — Telephone Encounter (Signed)
Results have been explained to patient, pt expressed understanding.  Pt states that she is not feeling any better since seen 10/20/14 - feels a little worse Pt is 3 days into Levaquin abx - reports no improvement. Advised that it may take a few more days for her to start feeling the effects of the Levaquin.  Pt is requesting any other rec's to help make her comfortable.   Patient Instructions     Chest xray today Will get lab work results from Dr. Ashby Dawes Levaquin 500 mg daily for 7 days >> if not better, then take another 7 days of levaquin Follow up with Dr. Halford Chessman on May 9 as already scheduled    Please advise Dr Halford Chessman. Thanks.

## 2014-10-23 NOTE — Telephone Encounter (Signed)
Dg Chest 2 View  10/20/2014   CLINICAL DATA:  Rule out pneumonia, cough and shortness of breath.  EXAM: CHEST  2 VIEW  COMPARISON:  07/03/2014  FINDINGS: The heart size and mediastinal contours are within normal limits. Aortic atherosclerosis noted. Both lungs are clear. Coarsened interstitial markings are identified bilaterally. Spondylosis noted within the thoracic spine.  IMPRESSION: 1. No acute cardiopulmonary abnormalities. 2. Aortic atherosclerosis noted.   Electronically Signed   By: Kerby Moors M.D.   On: 10/20/2014 14:11    Will have my nurse inform pt that chest xray did not show evidence for pneumonia.  No change to current tx plan.

## 2014-10-23 NOTE — Telephone Encounter (Signed)
Have her increase prednisone to 15 mg daily for 2 days, then 10 mg daily for 2 days, then back to baseline 5 mg dose of prednisone.

## 2014-11-02 ENCOUNTER — Ambulatory Visit (INDEPENDENT_AMBULATORY_CARE_PROVIDER_SITE_OTHER): Payer: Medicare Other | Admitting: Pulmonary Disease

## 2014-11-02 ENCOUNTER — Encounter: Payer: Self-pay | Admitting: Pulmonary Disease

## 2014-11-02 VITALS — BP 122/62 | HR 77 | Ht 62.5 in | Wt 137.0 lb

## 2014-11-02 DIAGNOSIS — J8489 Other specified interstitial pulmonary diseases: Secondary | ICD-10-CM | POA: Diagnosis not present

## 2014-11-02 DIAGNOSIS — M349 Systemic sclerosis, unspecified: Secondary | ICD-10-CM | POA: Diagnosis not present

## 2014-11-02 DIAGNOSIS — J438 Other emphysema: Secondary | ICD-10-CM

## 2014-11-02 NOTE — Patient Instructions (Signed)
Follow up in 3 months

## 2014-11-02 NOTE — Progress Notes (Signed)
Chief Complaint  Patient presents with  . Follow-up    Pt states that breathing has been doing okay since last OV- pt reports still having yellow mucus. Pt c/o neck pain- stiffness and pain; pt feels this is a side effect from Levaquin.     History of Present Illness: [name] is a 81 y.o. 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  She has been feeling better after last round of antibiotics.  She is not longer feeling fatigued, feverish, or sinus pressure.  She still has sinus congestion and drainage.  She gets cough with clear to yellow sputum.  She has been getting intermittent nose bleeds.  She denies chest pain, or wheezing.  She has noticed more puffiness in her hands.  She notices soreness in her neck muscle after taking levaquin.  She remains on 5 mg prednisone daily.  Reviewed labs and U/A from 10/09/14 from Dr. Mathis Fare office.  TESTS: August 2010 >> Bronchoscopy PFT 06/16/09 >> FEV1 1.99(118%), FEV1% 73, TLC 4.60(105%), DLCO 66%, no BD CT chest 11/24/11 >> centrilobular emphysema, RUL ASD CT sinus 07/30/13 >> opacification of Lt maxillary sinus, rightward NSD  PMHx >> CAD, HTN, HLD, DM, CVA, Macular degeneration, GERD, Dysphagia, IBS, Depression, Fibromyalgia, Hypothyroidism, Sjogren syndrome, Raynaud's phenomenon, Scleroderma  PSHx, Medications, Allergies, Fhx, Shx reviewed.   Physical Exam: Blood pressure 122/62, pulse 77, height 5' 2.5" (1.588 m), weight 137 lb (62.143 kg), SpO2 98 %. Body mass index is 24.64 kg/(m^2).  General - no distress HEENT - stable lesion on tongue, decreased range of motion of neck, mild tenderness on palpation of Lt sternocleidomastoid muscle Cardiac - s1s2 regular, no murmur  Chest - scattered rhonchi, no wheeze Abd - soft, nontender  Ext - no edema, skin tightening over fingers, puffiness over metacarpal joints b/l Neuro - normal strength   Psych - normal mood/behavior  Dg Chest 2 View  10/20/2014   CLINICAL DATA:  Rule out pneumonia, cough and shortness of breath.  EXAM: CHEST  2 VIEW  COMPARISON:  07/03/2014  FINDINGS: The heart size and mediastinal contours are within normal limits. Aortic atherosclerosis noted. Both lungs are clear. Coarsened interstitial markings are identified bilaterally. Spondylosis noted within the thoracic spine.  IMPRESSION: 1. No acute cardiopulmonary abnormalities. 2. Aortic atherosclerosis noted.   Electronically Signed   By: Kerby Moors M.D.   On: 10/20/2014 14:11    Assessment/Plan:  Recurrent Rt upper lung infiltrate >> either PNA or BOOP. CXR from 10/20/14 looked okay. Plan: - continue 5 mg prednisone  Acute sinusitis >> improved since last visit. Plan: - continue nasonex - she might need f/u with Dr. Janace Hoard with ENT  Hx of tobacco abuse with emphysema >> she has not noticed benefit from previous trials of inhaler therapy. Plan: - monitor clinically  Hx of Scleroderma. Plan: - f/u with rheumatology >> she has f/u with Dr. Ouida Sills in June and will then arrange for new rheumatologist  Sternocleidomastoid pain ?if related to levaquin. Plan: - have listed levaquin as adverse reaction   Chesley Mires, MD [place] 11/02/2014, 4:45 PM Pager:  [phone]

## 2014-11-06 ENCOUNTER — Telehealth: Payer: Self-pay | Admitting: Emergency Medicine

## 2014-11-06 ENCOUNTER — Telehealth: Payer: Self-pay | Admitting: Pulmonary Disease

## 2014-11-06 MED ORDER — CYCLOBENZAPRINE HCL 5 MG PO TABS: 5.0000 mg | ORAL_TABLET | Freq: Three times a day (TID) | ORAL | Status: DC | PRN

## 2014-11-06 MED ORDER — METAXALONE 400 MG PO TABS: 400.0000 mg | ORAL_TABLET | Freq: Three times a day (TID) | ORAL | Status: DC | PRN

## 2014-11-06 NOTE — Telephone Encounter (Signed)
Pt returned call [phone]

## 2014-11-06 NOTE — Telephone Encounter (Signed)
**  This is a VS pt not RB**  Spoke with pt. States that she is still having neck pain. She dicussed this with VS at her appointment and he advised that it would go aware. Neck pain has gotten worse since seeing VS. Has been taking Ibuprofen with no relief. Would like VS recommendations.  VS - please advise. Thanks.

## 2014-11-06 NOTE — Telephone Encounter (Signed)
Can send script for flexeril 5 mg tid prn, dispense 30 with one refill.

## 2014-11-06 NOTE — Telephone Encounter (Signed)
Called, spoke with pt.  Discussed below recs per Dr. Halford Chessman.  She verbalized understanding and would like to try metazalone.  Rx sent to pharm.  Pt aware and is in agreement to call back if this does not help.  She verbalized understanding and voiced no further questions or concerns at this time.

## 2014-11-06 NOTE — Telephone Encounter (Signed)
Spoke with pt. States that [name] is not covered by insurance and will cost $200. Pt will need alternative.  VS - please advise. Thanks.

## 2014-11-06 NOTE — Telephone Encounter (Signed)
Pt notified Flexeril 5 mg tid prn sent to pharmacy. Nothing further needed.

## 2014-11-06 NOTE — Telephone Encounter (Signed)
Can send order for metaxalone 400 mg.  She can take 1 to 2 pills three times per day as needed.  Send 30 pills with one refill.  If this does not help, then she would need xray of her neck.

## 2014-11-06 NOTE — Telephone Encounter (Signed)
Attempted to call pt. No answer, no option to leave a message. Will try back. 

## 2014-11-09 DIAGNOSIS — M542 Cervicalgia: Secondary | ICD-10-CM | POA: Diagnosis not present

## 2014-11-09 DIAGNOSIS — M5412 Radiculopathy, cervical region: Secondary | ICD-10-CM | POA: Diagnosis not present

## 2014-11-09 DIAGNOSIS — M129 Arthropathy, unspecified: Secondary | ICD-10-CM | POA: Diagnosis not present

## 2014-11-11 DIAGNOSIS — I10 Essential (primary) hypertension: Secondary | ICD-10-CM | POA: Diagnosis not present

## 2014-11-11 DIAGNOSIS — E039 Hypothyroidism, unspecified: Secondary | ICD-10-CM | POA: Diagnosis not present

## 2014-11-11 DIAGNOSIS — E119 Type 2 diabetes mellitus without complications: Secondary | ICD-10-CM | POA: Diagnosis not present

## 2014-11-16 ENCOUNTER — Other Ambulatory Visit: Payer: Self-pay | Admitting: Pulmonary Disease

## 2014-11-16 NOTE — Telephone Encounter (Signed)
Pt last seen 10/2014 Pt needs to f/u in 3 mos per AVS. Prednisone 5mg  refilled #100 with 0 refills.

## 2014-11-19 ENCOUNTER — Telehealth: Payer: Self-pay | Admitting: Pulmonary Disease

## 2014-11-19 NOTE — Telephone Encounter (Signed)
Called pt. She c/o prod cough, PND, nasal cong, blowing out green from nose. She is scheduled to see [facility] tomorrow at 31 for acute visit. Nothing further needed

## 2014-11-20 ENCOUNTER — Telehealth: Payer: Self-pay | Admitting: Pulmonary Disease

## 2014-11-20 ENCOUNTER — Ambulatory Visit: Payer: PRIVATE HEALTH INSURANCE | Admitting: Pulmonary Disease

## 2014-11-20 ENCOUNTER — Ambulatory Visit (INDEPENDENT_AMBULATORY_CARE_PROVIDER_SITE_OTHER): Payer: Medicare Other | Admitting: Adult Health

## 2014-11-20 ENCOUNTER — Encounter: Payer: Self-pay | Admitting: Adult Health

## 2014-11-20 VITALS — BP 130/68 | HR 77 | Temp 98.5°F | Ht 62.0 in | Wt 136.0 lb

## 2014-11-20 DIAGNOSIS — J209 Acute bronchitis, unspecified: Secondary | ICD-10-CM | POA: Diagnosis not present

## 2014-11-20 DIAGNOSIS — M25572 Pain in left ankle and joints of left foot: Secondary | ICD-10-CM | POA: Diagnosis not present

## 2014-11-20 DIAGNOSIS — M25579 Pain in unspecified ankle and joints of unspecified foot: Secondary | ICD-10-CM | POA: Insufficient documentation

## 2014-11-20 MED ORDER — AMOXICILLIN-POT CLAVULANATE 875-125 MG PO TABS: 1.0000 | ORAL_TABLET | Freq: Two times a day (BID) | ORAL | Status: AC

## 2014-11-20 NOTE — Assessment & Plan Note (Addendum)
?   Tendonitis in left ankle/achilles ? Secondary to Levaquin  Starting to improve  Avoid quinolones if possible    Plan   Follow up with rheumatology as planned  Keep ankle elevated.  Ice to ankle As needed   If not improving will need to call back  Please contact office for sooner follow up if symptoms do not improve or worsen or seek emergency care  Follow up Dr. Halford Chessman  In 6 -8 weeks and As needed

## 2014-11-20 NOTE — Assessment & Plan Note (Signed)
Recurrent flare with rhinitis  Recent cxr nad.  Frequent abx, suggested probiotic   Plan  Augmentin 875mg  Twice daily  For 7 days -take with food Saline nasal spray and gel  As needed   Begin Probiotic daily  Please contact office for sooner follow up if symptoms do not improve or worsen or seek emergency care  Follow up Dr. Halford Chessman  In 6 -8 weeks and As needed

## 2014-11-20 NOTE — Telephone Encounter (Signed)
Error.Stanley A Dalton ° °

## 2014-11-20 NOTE — Progress Notes (Signed)
   Subjective:    Patient ID: [name], female    DOB: 10/03/1930, 81 y.o.   MRN: [id number]  HPI  81yo former smoker with abnormal CT chest (probable BOOP) in the setting of emphysema on chronic prednisone, and scleroderma. Poor vision -macular degeneration  06/2013 swallow eval with [facility]-showing dysphagia  , rec Dysphagia D-3 diet for   CT sinus 07/2013  + >tx 14 d of augmentin  Follows with Dr. Ouida Sills for Scleroderma on chronic steroids  prednisone 5 mg .daily  Has known diverticulum in esophagus from previous neck surgery.    11/20/2014 Acute OV . Complains of 1 week of nasal congestion, yellow mucus , cough , fever low grade. Nasal irritation with intermittent nose bleeds.  Feels more tired. No chest pain, orthopnea, edema , fever, or n/v/d. Good appetite .   Seen 1 month ago with flare tx w/ levaquin, had trouble with it -left ankle pain with achilles tendon soreness. Also neck pain. It is gettig better but still sore and swells.  Has ov with rheumatology next month.     Review of Systems Constitutional:   No  weight loss, night sweats,  Fevers, chills,  +fatigue, or  lassitude.  HEENT:   No headaches,  Difficulty swallowing,  Tooth/dental problems, or  Sore throat,                No sneezing, itching, ear ache,  +nasal congestion, post nasal drip,   CV:  No chest pain,  Orthopnea, PND, swelling in lower extremities, anasarca, dizziness, palpitations, syncope.   GI  No heartburn, indigestion, abdominal pain, nausea, vomiting, diarrhea, change in bowel habits, loss of appetite, bloody stools.   Resp:  .  No chest wall deformity  Skin: no rash or lesions.  GU: no dysuria, change in color of urine, no urgency or frequency.  No flank pain, no hematuria   MS:  No joint pain or swelling.  No decreased range of motion.  No back pain.  Psych:  No change in mood or affect. No depression or anxiety.  No memory loss.         Objective:   Physical Exam  GEN:  A/Ox3; pleasant , NAD, elderly   HEENT:  [place]/AT,  EACs-clear, TMs-wnl, NOSE-clear drainage  THROAT-clear, no lesions, no postnasal drip or exudate noted. Nasal mucosa w/ no bleeding noted.   NECK:  Supple w/ fair ROM; no JVD; normal carotid impulses w/o bruits; no thyromegaly or nodules palpated; no lymphadenopathy.  RESP  Diminshed BS in bases no accessory muscle use, no dullness to percussion  CARD:  RRR, no m/r/g  , no peripheral edema, pulses intact, no cyanosis or clubbing.  GI:   Soft & nt; nml bowel sounds; no organomegaly or masses detected.  Musco: Warm bil, no deformities  Tender along left achilles, nml flex/pronation, nml strength, neg homans sign.   Neuro: alert, no focal deficits noted.    Skin: Warm, no lesions or rashes  CXR 4/26 NAD      Assessment & Plan:

## 2014-11-20 NOTE — Patient Instructions (Addendum)
Augmentin 875mg  Twice daily  For 7 days -take with food Saline nasal spray and gel  As needed   Begin Probiotic daily  Follow up with rheumatology as planned  Keep ankle elevated.  Ice to ankle As needed   If not improving will need to call back  Please contact office for sooner follow up if symptoms do not improve or worsen or seek emergency care  Follow up Dr. Halford Chessman  In 6 -8 weeks and As needed

## 2014-11-30 DIAGNOSIS — I7300 Raynaud's syndrome without gangrene: Secondary | ICD-10-CM | POA: Diagnosis not present

## 2014-11-30 DIAGNOSIS — M349 Systemic sclerosis, unspecified: Secondary | ICD-10-CM | POA: Diagnosis not present

## 2014-11-30 DIAGNOSIS — M25572 Pain in left ankle and joints of left foot: Secondary | ICD-10-CM | POA: Diagnosis not present

## 2014-11-30 DIAGNOSIS — M7989 Other specified soft tissue disorders: Secondary | ICD-10-CM | POA: Diagnosis not present

## 2014-12-16 DIAGNOSIS — M349 Systemic sclerosis, unspecified: Secondary | ICD-10-CM | POA: Diagnosis not present

## 2014-12-16 DIAGNOSIS — I7300 Raynaud's syndrome without gangrene: Secondary | ICD-10-CM | POA: Diagnosis not present

## 2014-12-16 DIAGNOSIS — M25572 Pain in left ankle and joints of left foot: Secondary | ICD-10-CM | POA: Diagnosis not present

## 2014-12-21 ENCOUNTER — Other Ambulatory Visit: Payer: Self-pay | Admitting: Pulmonary Disease

## 2014-12-22 ENCOUNTER — Telehealth: Payer: Self-pay | Admitting: Pulmonary Disease

## 2014-12-22 NOTE — Telephone Encounter (Signed)
Pt is wanting to know if she can try off the Prednisone for a while - states that the last time this was discussed it was decided that it was best to remain on 5mg  maintenance dose bc whenever she tries to come off she gets a lung infection. Pt states that she has been on it for so long and it makes her eat a lot and gain weight. Pt states that she really does not feel as though she needs it right now; breathing okay.  Please advise Dr Halford Chessman. Thanks.

## 2014-12-22 NOTE — Telephone Encounter (Signed)
Called pt to inform her of the recs per VS regarding Prednisone taper. Pt stated she is getting an abx from her PCP because of a current sinus infection. Pt c/o increase in SOB with activity, prod cough with light yellow mucus, PND, oral temperature of 100 (pt taking ibuprofen prn), BIL ankle edema (rheumatologist aware of edema) X 1 week.    Expand All Collapse All    Please tell her to take prednisone 2.5 mg (so 1/2 pill) daily for 1 week >> if she feels okay after this, then she can stop prednisone.    Dr. Halford Chessman please advise if still ok to reduce pred. Thanks.

## 2014-12-22 NOTE — Telephone Encounter (Addendum)
Please see phone note from 12/22/2014.

## 2014-12-22 NOTE — Telephone Encounter (Signed)
She can delay decreasing prednisone until after she finishes antibiotics for sinus infection.

## 2014-12-22 NOTE — Telephone Encounter (Signed)
Please tell her to take prednisone 2.5 mg (so 1/2 pill) daily for 1 week >> if she feels okay after this, then she can stop prednisone.

## 2014-12-22 NOTE — Telephone Encounter (Signed)
Patient notified.  Nothing further needed. 

## 2014-12-23 NOTE — Telephone Encounter (Signed)
Sharyn Lull, when you spoke with the pt did she still need the refill or can we close this refill encounter? I tried calling her today and line was busy Please advise thanks

## 2014-12-24 ENCOUNTER — Other Ambulatory Visit: Payer: Self-pay | Admitting: Pulmonary Disease

## 2015-01-06 DIAGNOSIS — E039 Hypothyroidism, unspecified: Secondary | ICD-10-CM | POA: Diagnosis not present

## 2015-01-06 DIAGNOSIS — I10 Essential (primary) hypertension: Secondary | ICD-10-CM | POA: Diagnosis not present

## 2015-01-06 DIAGNOSIS — E119 Type 2 diabetes mellitus without complications: Secondary | ICD-10-CM | POA: Diagnosis not present

## 2015-01-14 ENCOUNTER — Telehealth: Payer: Self-pay | Admitting: Internal Medicine

## 2015-01-14 DIAGNOSIS — I509 Heart failure, unspecified: Secondary | ICD-10-CM

## 2015-01-14 DIAGNOSIS — I10 Essential (primary) hypertension: Secondary | ICD-10-CM

## 2015-01-14 NOTE — Telephone Encounter (Signed)
C/o swelling lower legs, puffy around feet and ankles for a month  Weight on Monday 133, today Thursday 138  Sleeping with elevation head higher this week  Off prednisone and antibiotic for one week  Feels short of breath if she bends over or does housework  B/P 174/80 P 80 today  Discussed with Dr, Heron Nay  Instructed patient to call PCP Dr  Ashby Dawes and follow up today as ordered per Dr. Radford Pax

## 2015-01-14 NOTE — Telephone Encounter (Signed)
Set pt up for BMET< BNP,TSH, CBC I can see pt on7.29

## 2015-01-14 NOTE — Telephone Encounter (Signed)
New message    Pt states b/p running higher than normal 174/80 this morning and pulse has been ok Pt also states she is having swelling  Pt c/o swelling: STAT is pt has developed SOB within 24 hours  1. How long have you been experiencing swelling? A month   2. Where is the swelling located? Ankles and legs to knee  3.  Are you currently taking a "fluid pill"? No  4.  Are you currently SOB? No only when she bends over or does housework  5.  Have you traveled recently?No  Please call to discuss

## 2015-01-15 NOTE — Telephone Encounter (Signed)
Appointment made for 7/29 at 3:15pm.

## 2015-01-15 NOTE — Telephone Encounter (Signed)
Called patient with appointment for Dr. Harrington Challenger for 7/29 at 3:15pm  Labs ordered and patient knows to come in on 7/26 at 1130am for blood draw

## 2015-01-15 NOTE — Addendum Note (Signed)
Addended by: Carilyn Goodpasture on: 01/15/2015 10:04 AM   Modules accepted: Orders

## 2015-01-18 ENCOUNTER — Other Ambulatory Visit (INDEPENDENT_AMBULATORY_CARE_PROVIDER_SITE_OTHER): Payer: Medicare Other | Admitting: *Deleted

## 2015-01-18 ENCOUNTER — Telehealth: Payer: Self-pay

## 2015-01-18 DIAGNOSIS — D638 Anemia in other chronic diseases classified elsewhere: Secondary | ICD-10-CM | POA: Diagnosis not present

## 2015-01-18 DIAGNOSIS — E7800 Pure hypercholesterolemia, unspecified: Secondary | ICD-10-CM

## 2015-01-18 DIAGNOSIS — R0602 Shortness of breath: Secondary | ICD-10-CM

## 2015-01-18 DIAGNOSIS — I10 Essential (primary) hypertension: Secondary | ICD-10-CM

## 2015-01-18 LAB — BASIC METABOLIC PANEL
BUN: 14 mg/dL (ref 6–23)
CO2: 26 mEq/L (ref 19–32)
Calcium: 9.3 mg/dL (ref 8.4–10.5)
Chloride: 101 mEq/L (ref 96–112)
Creatinine, Ser: 0.82 mg/dL (ref 0.40–1.20)
GFR: 70.93 mL/min (ref >60.00)
Glucose, Bld: 121 mg/dL — ABNORMAL HIGH (ref 70–99)
Potassium: 3.4 mEq/L — ABNORMAL LOW (ref 3.5–5.1)
Sodium: 137 mEq/L (ref 135–145)

## 2015-01-18 LAB — CBC WITH DIFFERENTIAL/PLATELET
Basophils Absolute: 0 10*3/uL (ref 0.0–0.1)
Basophils Relative: 0.3 % (ref 0.0–3.0)
Eosinophils Absolute: 0.2 10*3/uL (ref 0.0–0.7)
Eosinophils Relative: 2.5 % (ref 0.0–5.0)
HCT: 37.4 % (ref 36.0–46.0)
Hemoglobin: 12.6 g/dL (ref 12.0–15.0)
Lymphocytes Relative: 28.8 % (ref 12.0–46.0)
Lymphs Abs: 2.3 10*3/uL (ref 0.7–4.0)
MCHC: 33.7 g/dL (ref 30.0–36.0)
MCV: 85.1 fl (ref 78.0–100.0)
Monocytes Absolute: 0.6 10*3/uL (ref 0.1–1.0)
Monocytes Relative: 7.7 % (ref 3.0–12.0)
Neutro Abs: 4.8 10*3/uL (ref 1.4–7.7)
Neutrophils Relative %: 60.7 % (ref 43.0–77.0)
Platelets: 277 10*3/uL (ref 150.0–400.0)
RBC: 4.39 Mil/uL (ref 3.87–5.11)
RDW: 14.9 % (ref 11.5–15.5)
WBC: 7.9 10*3/uL (ref 4.0–10.5)

## 2015-01-18 LAB — TSH: TSH: 1.68 u[IU]/mL (ref 0.35–4.50)

## 2015-01-18 LAB — BRAIN NATRIURETIC PEPTIDE: Pro B Natriuretic peptide (BNP): 75 pg/mL (ref 0.0–100.0)

## 2015-01-18 NOTE — Telephone Encounter (Signed)
Patient in today for lab work (BMET, BNP, TSH, CBC). Patient c/o SOB and swelling in ABD and BLE, and triage called for assessment. She st she get SOB when bending over. She does have 1+-2+ swelling in her legs. Her eyes are mildly swollen as well. Patient not in obvious distress at this time.  BP today was 147/70. Instructed the patient to let her blood work results and informed her she will be called with medication changes afterward. Instructed the patient to go to the ED if her symptoms worsen in the meantime. Patient agrees with treatment plan and is grateful for the help.   Called patient and informed her that BNP was WNL. Reiterated to go to the ED if symptoms worsen before she receives Dr. Harrington Challenger' recommendations or before OV Friday.

## 2015-01-18 NOTE — Addendum Note (Signed)
Addended by: Eulis Foster on: 01/18/2015 03:42 PM   Modules accepted: Orders

## 2015-01-19 ENCOUNTER — Other Ambulatory Visit: Payer: Medicare Other

## 2015-01-21 ENCOUNTER — Other Ambulatory Visit: Payer: Self-pay | Admitting: Internal Medicine

## 2015-01-21 DIAGNOSIS — M25572 Pain in left ankle and joints of left foot: Secondary | ICD-10-CM

## 2015-01-22 ENCOUNTER — Ambulatory Visit (INDEPENDENT_AMBULATORY_CARE_PROVIDER_SITE_OTHER): Payer: Medicare Other | Admitting: Internal Medicine

## 2015-01-22 VITALS — BP 140/58 | HR 93 | Ht 62.0 in | Wt 142.8 lb

## 2015-01-22 DIAGNOSIS — I10 Essential (primary) hypertension: Secondary | ICD-10-CM | POA: Diagnosis not present

## 2015-01-22 MED ORDER — TRIAMTERENE-HCTZ 37.5-25 MG PO TABS: ORAL_TABLET | ORAL | Status: DC

## 2015-01-22 NOTE — Patient Instructions (Addendum)
Medication Instructions:  Your physician has recommended you make the following change in your medication:  1) Stop Amlodipine  2) Start Mazide 37.5/25 mg take 1/2 tablet twice daily   Labwork: Your physician recommends that you return for lab work today: CMET     Testing/Procedures: None ordered  Follow-Up: Your physician recommends that you schedule a follow-up appointment at the end of August   Any Other Special Instructions Will Be Listed Below (If Applicable).  Call next week with how you are feeling with medication change

## 2015-01-22 NOTE — Progress Notes (Signed)
Cardiology Office Note   Date:  01/22/2015   ID:  [name], DOB 1931-01-26, MRN [id number]  PCP:  Merrilee Seashore, MD  Cardiologist:   Dorris Carnes, MD   No chief complaint on file.  Pt presents for evaluation of edema and SOB     History of Present Illness: [name] is a 81 y.o. female with a history of  BOOP and CAD (s/p IWMI with stent ot RCA. repeat stnet to the RCA for instent restenois. She is also s/p DES to L main. Last cath In summer 2011 showed: 40% LAD lesion; 50% Lcx; small OM1 with 80% lesion; RCA with 20% instent restenosis This was stable. Pt continued on medical Rx.Denies CP Staying active.  I saw her back in November  She called over the past few days  COmplained of ankle swelling and SOB   Labs done  OK   Appt scheduled  for today    She denies CP     Current Outpatient Prescriptions  Medication Sig Dispense Refill  . amLODipine (NORVASC) 10 MG tablet Once a day    . aspirin EC 81 MG tablet Take 1 tablet (81 mg total) by mouth daily. 90 tablet 3  . esomeprazole (NEXIUM) 40 MG capsule Take 1 capsule (40 mg total) by mouth daily before breakfast. 30 capsule 6  . imipramine (TOFRANIL) 25 MG tablet Take 75 mg by mouth at bedtime.     . insulin lispro (HUMALOG) 100 UNIT/ML injection Take 6 units in AM, 6 units at noon and 4 units at bedtime.    [name] levothyroxine (SYNTHROID, LEVOTHROID) 100 MCG tablet Take 100 mcg by mouth daily before breakfast.    . mometasone (NASONEX) 50 MCG/ACT nasal spray Place 2 sprays into the nose as needed.     . triazolam (HALCION) 0.25 MG tablet Take 0.25 mg by mouth at bedtime as needed. For sleep     No current facility-administered medications for this visit.    Allergies:   Lantus; Levaquin; Metoprolol-hydrochlorothiazide; Novolog; Sulfonamide derivatives; Titanium; and Adhesive   Past Medical History  Diagnosis Date  . Coronary artery disease   . Dressler's syndrome   . Hypertension   . Dyslipidemia     . Diabetes mellitus   . CVA (cerebral infarction) 1991  . Macular degeneration   . GERD (gastroesophageal reflux disease)   . Diverticulosis   . IBS (irritable bowel syndrome)   . Zenker's diverticulum   . Emphysema   . Pulmonary hypertension   . Chronic sinusitis   . Fibromyalgia   . Hypothyroidism   . Sjogren's disease   . Raynaud's phenomenon   . Cervical disc disease   . Adenomatous colon polyp   . Scleroderma   . COPD (chronic obstructive pulmonary disease)   . Arthritis   . Blood transfusion   . Myocardial infarction may 27th 2007  . Depression   . 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 Cancer     cervical cancer pre hysterectomy  . Sleep apnea     STOP BANG SCORE 5  . Legally blind   . CONSTIPATION, CHRONIC 06/26/2007    Qualifier: Diagnosis of  By: Nils Pyle CMA ([place]), Mearl Latin    . Pneumonia 07/16/2013     HX PNEUMONIA  . Pneumonia     Past Surgical History  Procedure Laterality Date  . Total abdominal hysterectomy  1973  . Bilateral oophorectomy  2002  . Spinal fusion  1997    and implantation of a plate   . Lobe thyroid removal    . Thyroid lobectomy  1991    removal of left lobe thyroid  . Cholecystectomy  1965  . Appendectomy    . Cataract extraction    . Bronchoscopy  02-22-09  . Stented cardiac  artery  2007 / 2007 / 2010    X 3 STENT  . Proctoscopy  03/18/2012    Procedure: PROCTOSCOPY;  Surgeon: Adin Hector, MD;  Location: [facility];  Service: General;  Laterality: N/A;  rigid procto  . Colon surgery  2014    colon resection  . Esophagogastroduodenoscopy (egd) with esophageal dilation N/A 06/18/2013    Procedure: ESOPHAGOGASTRODUODENOSCOPY (EGD) WITH ESOPHAGEAL DILATION;  Surgeon: Inda Castle, MD;  Location: [facility];  Service: Endoscopy;  Laterality: N/A;     Social History:  The patient  reports that she quit smoking about 26 years ago. Her smoking use included Cigarettes. She has a 40 pack-year smoking history. She  has never used smokeless tobacco. She reports that she does not drink alcohol or use illicit drugs.   Family History:  The patient's family history includes Cancer in her brother and sister; Heart disease in her father; Other (age of onset: 67) in her mother; Stroke (age of onset: 45) in her father.    ROS:  Please see the history of present illness. All other systems are reviewed and  Negative to the above problem except as noted.    PHYSICAL EXAM: VS:  BP 140/58 mmHg  Pulse 93  Ht 5\' 2"  (1.575 m)  Wt 142 lb 12.8 oz (64.774 kg)  BMI 26.11 kg/m2  SpO2 95%  GEN: Well nourished, well developed, in no acute distress HEENT: normal Neck: no JVD, carotid bruits, or masses Cardiac: RRR; no murmurs, rubs, or gallops,Tr to 1 edema  Respiratory:  clear to auscultation bilaterally, normal work of breathing GI: soft, nontender, nondistended, + BS  No hepatomegaly  MS: no deformity Moving all extremities   Skin: warm and dry, no rash Neuro:  Strength and sensation are intact Psych: euthymic mood, full affect   EKG:  EKG is not  ordered today.   Lipid Panel    Component Value Date/Time   CHOL 175 11/14/2013 0945   TRIG 113.0 11/14/2013 0945   HDL 54.00 11/14/2013 0945   CHOLHDL 3 11/14/2013 0945   VLDL 22.6 11/14/2013 0945   LDLCALC 98 11/14/2013 0945   LDLDIRECT 127.6 05/15/2013 1422      Wt Readings from Last 3 Encounters:  01/22/15 142 lb 12.8 oz (64.774 kg)  11/20/14 136 lb (61.689 kg)  11/02/14 137 lb (62.143 kg)      ASSESSMENT AND PLAN:  1.  Edema/ SOB  Pt has edema on exam  Otherwise volume looks OK  I would recomm stopping amlodipine  She has been on for awhile, cannot explain change, but worth trial of stopping I would recomm maxzide 35.5/25  Cut in 1/2  Take BID Check labs today   Pt to call in 1 wk with response  Follow BP  Will need repeat labs if stays on regimn     2  CAD  I am not convinced of active angina  3.  HTN  WIll need to follow with changes          Signed, Dorris Carnes, MD  01/22/2015 4:03 PM    [address] [place], [place]  [address] Phone: [phone]; Fax: [phone]

## 2015-01-23 LAB — COMPLETE METABOLIC PANEL WITH GFR
ALT: 21 U/L (ref 6–29)
AST: 24 U/L (ref 10–35)
Albumin: 3.9 g/dL (ref 3.6–5.1)
Alkaline Phosphatase: 56 U/L (ref 33–130)
BUN: 18 mg/dL (ref 7–25)
CO2: 25 mmol/L (ref 20–31)
Calcium: 8.9 mg/dL (ref 8.6–10.4)
Chloride: 99 mmol/L (ref 98–110)
Creat: 0.85 mg/dL (ref 0.60–0.88)
GFR, Est African American: 74 mL/min (ref >=60)
GFR, Est Non African American: 64 mL/min (ref >=60)
Glucose, Bld: 114 mg/dL — ABNORMAL HIGH (ref 65–99)
Potassium: 3.4 mmol/L — ABNORMAL LOW (ref 3.5–5.3)
Sodium: 133 mmol/L — ABNORMAL LOW (ref 135–146)
Total Bilirubin: 0.3 mg/dL (ref 0.2–1.2)
Total Protein: 6.7 g/dL (ref 6.1–8.1)

## 2015-01-27 ENCOUNTER — Telehealth: Payer: Self-pay | Admitting: *Deleted

## 2015-01-27 DIAGNOSIS — I10 Essential (primary) hypertension: Secondary | ICD-10-CM

## 2015-01-27 DIAGNOSIS — R0602 Shortness of breath: Secondary | ICD-10-CM

## 2015-01-27 DIAGNOSIS — R609 Edema, unspecified: Secondary | ICD-10-CM

## 2015-01-27 NOTE — Telephone Encounter (Signed)
I placed call to pt to review lab work.  She saw Dr. Harrington Challenger on 7/29 and was to call this week with update on how she was feeling.  She reports swelling is much better. Thinks she has lost about 6 lbs. Blood pressure improved. Reports it is 144/68. Heart rate up to 90-100 with activity but is around 70 at rest.  She is coming in for BMP and BNP on February 02, 2015.

## 2015-01-28 ENCOUNTER — Telehealth: Payer: Self-pay | Admitting: Pulmonary Disease

## 2015-01-28 NOTE — Telephone Encounter (Signed)
Spoke with the pt  She states that she has noticed increased SOB and sputum production today- ? Sputum color b/c she can not see well  She has also had low grade temp on and off and fears that she has PNA again  I scheduled her to see TP at 2:45 pm tomorrow  Advised pt to seek emergent care sooner if needed and she verbalized understanding

## 2015-01-28 NOTE — Telephone Encounter (Signed)
I have reviewed  Agree with plan.

## 2015-01-29 ENCOUNTER — Telehealth: Payer: Self-pay | Admitting: Internal Medicine

## 2015-01-29 ENCOUNTER — Ambulatory Visit: Payer: Medicare Other | Admitting: Internal Medicine

## 2015-01-29 ENCOUNTER — Other Ambulatory Visit (INDEPENDENT_AMBULATORY_CARE_PROVIDER_SITE_OTHER): Payer: Medicare Other

## 2015-01-29 ENCOUNTER — Encounter: Payer: Self-pay | Admitting: Adult Health

## 2015-01-29 ENCOUNTER — Ambulatory Visit (INDEPENDENT_AMBULATORY_CARE_PROVIDER_SITE_OTHER): Payer: Medicare Other | Admitting: Adult Health

## 2015-01-29 ENCOUNTER — Ambulatory Visit (INDEPENDENT_AMBULATORY_CARE_PROVIDER_SITE_OTHER)
Admission: RE | Admit: 2015-01-29 | Discharge: 2015-01-29 | Disposition: A | Discharge status: Home / Self Care | Payer: Medicare Other | Source: Ambulatory Visit | Attending: Adult Health | Admitting: Adult Health

## 2015-01-29 VITALS — BP 150/74 | HR 80 | Temp 98.1°F | Ht 62.0 in | Wt 141.0 lb

## 2015-01-29 DIAGNOSIS — R058 Other specified cough: Secondary | ICD-10-CM

## 2015-01-29 DIAGNOSIS — R0600 Dyspnea, unspecified: Secondary | ICD-10-CM

## 2015-01-29 DIAGNOSIS — J449 Chronic obstructive pulmonary disease, unspecified: Secondary | ICD-10-CM | POA: Diagnosis not present

## 2015-01-29 DIAGNOSIS — R05 Cough: Secondary | ICD-10-CM

## 2015-01-29 DIAGNOSIS — R509 Fever, unspecified: Secondary | ICD-10-CM | POA: Diagnosis not present

## 2015-01-29 DIAGNOSIS — R531 Weakness: Secondary | ICD-10-CM

## 2015-01-29 DIAGNOSIS — M25473 Effusion, unspecified ankle: Secondary | ICD-10-CM

## 2015-01-29 DIAGNOSIS — J8489 Other specified interstitial pulmonary diseases: Secondary | ICD-10-CM | POA: Diagnosis not present

## 2015-01-29 DIAGNOSIS — I10 Essential (primary) hypertension: Secondary | ICD-10-CM

## 2015-01-29 DIAGNOSIS — R609 Edema, unspecified: Secondary | ICD-10-CM

## 2015-01-29 DIAGNOSIS — R0602 Shortness of breath: Secondary | ICD-10-CM | POA: Diagnosis not present

## 2015-01-29 LAB — BASIC METABOLIC PANEL
BUN: 16 mg/dL (ref 6–23)
CO2: 28 mEq/L (ref 19–32)
Calcium: 8.8 mg/dL (ref 8.4–10.5)
Chloride: 86 mEq/L — ABNORMAL LOW (ref 96–112)
Creatinine, Ser: 0.81 mg/dL (ref 0.40–1.20)
GFR: 71.94 mL/min (ref >60.00)
Glucose, Bld: 178 mg/dL — ABNORMAL HIGH (ref 70–99)
Potassium: 3.5 mEq/L (ref 3.5–5.1)
Sodium: 122 mEq/L — ABNORMAL LOW (ref 135–145)

## 2015-01-29 LAB — BRAIN NATRIURETIC PEPTIDE: Pro B Natriuretic peptide (BNP): 40 pg/mL (ref 0.0–100.0)

## 2015-01-29 LAB — CORTISOL: Cortisol, Plasma: 7.7 ug/dL

## 2015-01-29 NOTE — Progress Notes (Signed)
   Subjective:    Patient ID: [name], female    DOB: [birth year], 81 y.o.   MRN: [id number]  HPI  81yo former smoker with abnormal CT chest (probable BOOP) in the setting of emphysema on chronic prednisone, and scleroderma. Poor vision -macular degeneration  06/2013 swallow eval with [facility]-showing dysphagia  , rec Dysphagia D-3 diet for   CT sinus 07/2013  + >tx 14 d of augmentin  Follows with Rheumatology for Scleroderma hx of chronic steroids  prednisone 5 mg .daily -on /off  Has known diverticulum in esophagus from previous neck surgery.    01/29/2015 Acute OV .: BOOP/COPD/Scleroderma Pt presents for an acute office visit.  Patient complains over the last month she has been more weak and short of breath. She has recently stopped her prednisone. Patient's been on chronic prednisone for years on and off for scleroderma and breathing issues. Recently had been on prednisone 5 mg daily. It appears that she weaned off of this about 4 weeks ago. Since then. Feels that she is more short of breath and has no energy.  She does have some intermittent cough and congestion. However, denies any discolored mucus. Has had an occasional low-grade fever.  Patient had been dealing with ankle pain and swelling. Her Norvasc was stopped. She does feel that her ankle swelling is somewhat better, however, continues to have ankle pain. She denies any calf pain, hemoptysis, chest pain, orthopnea, PND .   Review of Systems Constitutional:   No  weight loss, night sweats,  Fevers, chills,  +fatigue, or  lassitude.  HEENT:   No headaches,  Difficulty swallowing,  Tooth/dental problems, or  Sore throat,                No sneezing, itching, ear ache, nasal congestion, post nasal drip,   CV:  No chest pain,  Orthopnea, PND, swelling in lower extremities, anasarca, dizziness, palpitations, syncope.   GI  No heartburn, indigestion, abdominal pain, nausea, vomiting, diarrhea, change in bowel habits, loss of  appetite, bloody stools.   Resp:  .  No chest wall deformity  Skin: no rash or lesions.  GU: no dysuria, change in color of urine, no urgency or frequency.  No flank pain, no hematuria   MS:  No joint pain or swelling.  No decreased range of motion.  No back pain.  Psych:  No change in mood or affect. No depression or anxiety.  No memory loss.         Objective:   Physical Exam  GEN: A/Ox3; pleasant , NAD, elderly   HEENT:  [place]/AT,  EACs-clear, TMs-wnl, NOSE-clear drainage  THROAT-clear, no lesions, no postnasal drip or exudate noted. Nasal mucosa w/ no bleeding noted.   NECK:  Supple w/ fair ROM; no JVD; normal carotid impulses w/o bruits; no thyromegaly or nodules palpated; no lymphadenopathy.  RESP  Diminshed BS in bases no accessory muscle use, no dullness to percussion  CARD:  RRR, no m/r/g  , no peripheral edema, pulses intact, no cyanosis or clubbing.  GI:   Soft & nt; nml bowel sounds; no organomegaly or masses detected.  Musco: Warm bil, no deformities  nml strength, neg homans sign.   Neuro: alert, no focal deficits noted.    Skin: Warm, no lesions or rashes  CXR 4/26 NAD      Assessment & Plan:

## 2015-01-29 NOTE — Assessment & Plan Note (Signed)
Elevated blood pressure Intolerant to Norvasc due to edema Advised on low salt diet and follow with primary care and cardiology for blood pressure management

## 2015-01-29 NOTE — Assessment & Plan Note (Signed)
Questionable flare off of prednisone. Check chest x-ray. Patient has been on steroids  for many years , check cortisol level today. Hold on anabiotic set this time.  Plan  chest xray today .  Labs today .  Restart Prednisone 10mg  daily for 1 week, then 5mg  daily -hold at this dose.  Follow up with Primary MD /Cardiology regarding your blood pressure  Follow up Dr. Halford Chessman  In 6 weeks and .As needed  -as planned  Please contact office for sooner follow up if symptoms do not improve or worsen or seek emergency care

## 2015-01-29 NOTE — Patient Instructions (Addendum)
Chest xray today .  Labs today .  Restart Prednisone 10mg  daily for 1 week, then 5mg  daily -hold at this dose.  Follow up with Primary MD /Cardiology regarding your blood pressure  Follow up Dr. Halford Chessman  In 6 weeks and .As needed  -as planned  Please contact office for sooner follow up if symptoms do not improve or worsen or seek emergency care

## 2015-01-29 NOTE — Assessment & Plan Note (Signed)
Questionable etiology Questionable if this is secondary to stopping prednisone. Check cortisol level.

## 2015-01-29 NOTE — Telephone Encounter (Signed)
Received message directly from operator from patient who states she cannot get comfortable since she started Maxzide on 7/29.  She states she feels like pins and needles sticking in her skin, worse when she is laying down.  States she is not getting much sleep because she cannot get comfortable.  Reports she feels like extra fluid is gone and has lost some weight since starting the medication but she c/o SOB.  Has appointment with pulmonologist today; states she gets pneumonia frequently.  States she does have some drainage but has not seen color of phlegm due to legally blind.  She reports heart rate 99 bpm even at rest but vital signs were checked during our phone call and the machine measured: BP 166/70 mmHg, HR 81 bpm.  Patient would like to know what Dr. Harrington Challenger wants her to do.  States she has been on maintenance dose of Prednisone 5 mg for a long time and recently stopped.  Denies chest pain/discomfort.  Complains of nausea since starting Maxzide.  She would like to know what Dr. Harrington Challenger wants her to do.  I advised that I will discuss with Dr. Harrington Challenger who is in the office today and will call her back.  Patient verbalized understanding and agreement.

## 2015-01-29 NOTE — Telephone Encounter (Signed)
Reviewed patient's complaint with Dr. Harrington Challenger who is in the office today.  She advised patient d/c Maxzide and that she is glad the swelling is gone.  Advised her to keep appointment with Dr. Halford Chessman today for SOB and that she will have to think about what patient can take for hypertension due to her sensitivity to multiple medications.  I reviewed Dr. Alan Ripper advice with patient and that someone will call her back with her advice on BP med.  Patient states there are not any that she can tolerate.  She requests samples or 10 day supply of anything that Dr. Harrington Challenger wants to prescribe.  I advised I will put that in the note.  She verbalized understanding and agreement with plan of care.

## 2015-01-29 NOTE — Telephone Encounter (Signed)
New message    Pt c/o medication issue:  1. Name of Medication: triamterene/maxide  2. How are you currently taking this medication (dosage and times per day)? 1/2 tablet 2 times a day; 37.5mg   3. Are you having a reaction (difficulty breathing--STAT)? Pt states she has SOB and has appt with Dr Halford Chessman  4. What is your medication issue? Can't sleep, anxious and feels like pin and needles in body when pt lies down; pt has cough also   Please call to discuss

## 2015-02-01 ENCOUNTER — Other Ambulatory Visit: Payer: Self-pay | Admitting: Adult Health

## 2015-02-01 ENCOUNTER — Other Ambulatory Visit (INDEPENDENT_AMBULATORY_CARE_PROVIDER_SITE_OTHER): Payer: Medicare Other

## 2015-02-01 DIAGNOSIS — I10 Essential (primary) hypertension: Secondary | ICD-10-CM | POA: Diagnosis not present

## 2015-02-01 LAB — BASIC METABOLIC PANEL
BUN: 16 mg/dL (ref 6–23)
CO2: 29 mEq/L (ref 19–32)
Calcium: 8.9 mg/dL (ref 8.4–10.5)
Chloride: 93 mEq/L — ABNORMAL LOW (ref 96–112)
Creatinine, Ser: 0.85 mg/dL (ref 0.40–1.20)
GFR: 68.04 mL/min (ref >60.00)
Glucose, Bld: 181 mg/dL — ABNORMAL HIGH (ref 70–99)
Potassium: 3.9 mEq/L (ref 3.5–5.1)
Sodium: 129 mEq/L — ABNORMAL LOW (ref 135–145)

## 2015-02-01 NOTE — Progress Notes (Signed)
Quick Note:  Called pt and received busy tone. Unable to LVM. ______

## 2015-02-02 ENCOUNTER — Telehealth: Payer: Self-pay | Admitting: Internal Medicine

## 2015-02-02 ENCOUNTER — Other Ambulatory Visit (INDEPENDENT_AMBULATORY_CARE_PROVIDER_SITE_OTHER): Payer: Medicare Other | Admitting: *Deleted

## 2015-02-02 DIAGNOSIS — R0602 Shortness of breath: Secondary | ICD-10-CM

## 2015-02-02 DIAGNOSIS — I10 Essential (primary) hypertension: Secondary | ICD-10-CM

## 2015-02-02 DIAGNOSIS — R609 Edema, unspecified: Secondary | ICD-10-CM | POA: Diagnosis not present

## 2015-02-02 LAB — BASIC METABOLIC PANEL
BUN: 16 mg/dL (ref 6–23)
CO2: 29 mEq/L (ref 19–32)
Calcium: 8.6 mg/dL (ref 8.4–10.5)
Chloride: 93 mEq/L — ABNORMAL LOW (ref 96–112)
Creatinine, Ser: 0.89 mg/dL (ref 0.40–1.20)
GFR: 64.53 mL/min (ref >60.00)
Glucose, Bld: 243 mg/dL — ABNORMAL HIGH (ref 70–99)
Potassium: 3.8 mEq/L (ref 3.5–5.1)
Sodium: 130 mEq/L — ABNORMAL LOW (ref 135–145)

## 2015-02-02 LAB — BRAIN NATRIURETIC PEPTIDE: Pro B Natriuretic peptide (BNP): 150 pg/mL — ABNORMAL HIGH (ref 0.0–100.0)

## 2015-02-02 MED ORDER — HYDRALAZINE HCL 10 MG PO TABS: ORAL_TABLET | ORAL | Status: DC

## 2015-02-02 NOTE — Telephone Encounter (Signed)
New message     Pt c/o BP issue: STAT if pt c/o blurred vision, one-sided weakness or slurred speech  1. What are your last 5 BP readings? 175/80  2. Are you having any other symptoms (ex. Dizziness, headache, blurred vision, passed out)? no  3. What is your BP issue?  Pt is not taking any bp medication. How high should her bp go before she takes a bp pill?

## 2015-02-02 NOTE — Telephone Encounter (Signed)
Pt's BP is running high. Last night BP was 178/80, today's BP 161/72 HR 102. Pt was taking Norvasc 10 mg once a day, this medication was D/C also was Maxzide medication a week ago due to LE edema. The swelling is gone but BP is high. Pt states she is very sensitive to BP medications. Pt states once before Dr. Harrington Challenger prescribed Lasix for BP, but  she is has not wanted to prescribed it now. Pt would like to know if she can take the 5 mg of Norvasc now instead of the 10 mg to see if it help to bring BP down. Richardson Dopp PA aware , he recommends Hydralazine 10 mg twice a day. Pt would like to have only a 30 days supply and no refills, because she said  she wants to try first to see if she can tolerate this medication. A 30 days supply and no refills send to [facility] per pt's request.

## 2015-02-02 NOTE — Progress Notes (Signed)
Quick Note:  Called and spoke with pt. Reviewed results and recs. Pt stated she has restarted prednisone. Nothing further needed. ______

## 2015-02-02 NOTE — Progress Notes (Signed)
Dg Chest 2 View  01/29/2015   CLINICAL DATA:  Cough and shortness of breath and intermittent low-grade fever for the past week ; history of COPD, CVA, and CAD  EXAM: CHEST  2 VIEW  COMPARISON:  PA and lateral chest x-ray of October 20, 2014  FINDINGS: The lungs are well-expanded. The interstitial markings are increased bilaterally. There are coarse infrahilar lung markings bilaterally which are not new. The heart and pulmonary vascularity are normal. The mediastinum is normal in width. There is no pleural effusion. The bony thorax exhibits no acute abnormality. There surgical clips in the left paratracheal region in the lower neck.  IMPRESSION: COPD with mildly increased prominence of the chronically increased interstitial markings suggesting superimposed acute bronchitis. There is no pneumonia nor alveolar edema.   Electronically Signed   By: David  Martinique M.D.   On: 01/29/2015 16:18   Reviewed and agree with assessment/plan.

## 2015-02-08 ENCOUNTER — Telehealth: Payer: Self-pay | Admitting: Internal Medicine

## 2015-02-08 NOTE — Telephone Encounter (Signed)
SPOKE WITH PT   RE MESSAGE   PT HAS NOTICED  B/P CONTINUES   TO RUN HIGH  WITH ANY  ACTIVITY  TODAY'S READING  IS  188/90   HAS   NOTED  SOME  CHEST TIGHTNESS AS WELL PAIN IS  DIFFERENT  THAN  WHEN  HAD  HEART ATTACKS  HAS NOT TAKEN  ANY  NTG  DISCUSSED  WITH  PT  ABOUT  MAYBE COMING  IN TODAY FOR  APPT  PER PT  HAS  NO TRANSPORATION WILL CONTINUE  TO MONITOR   CURRENTLY ONLY  TAKING HYDRALAZINE  10 MG  BID  WILL CALL 911 IF   S/S WORSEN   WILL FORWARD MESSAGE  TO DR ROSS  FOR REVIEW./CY

## 2015-02-08 NOTE — Telephone Encounter (Signed)
New message    Pt c/o BP issue: STAT if pt c/o blurred vision, one-sided weakness or slurred speech  1. What are your last 5 BP readings? Today @ 8:30am 176/73 pulse 96  2. Are you having any other symptoms (ex. Dizziness, headache, blurred vision, passed out)? No  3. What is your BP issue? B/p running high  Pt states if she try to get up and do anything her b/p goes up  Please call to discuss

## 2015-02-13 NOTE — Telephone Encounter (Signed)
Agree with recomm Pt needs to call back with BP in a few wks

## 2015-02-15 NOTE — Telephone Encounter (Signed)
Called patient about BP. Patient states that BP is still running high, averaging around  170/80. Patient has been taking hydralazine  10 mg BID, but has not really made a difference in her BP. Patient denies any SOB or Chest pain at this time. Will forward to Dr. Harrington Challenger for advisement.

## 2015-02-16 NOTE — Telephone Encounter (Signed)
Called pt  She has 10 mg tablets  I recomm taht she take 20 mg 3x per day She will call later this week with BP

## 2015-02-18 ENCOUNTER — Telehealth: Payer: Self-pay | Admitting: Internal Medicine

## 2015-02-18 NOTE — Telephone Encounter (Signed)
New message     Pt c/o medication issue:  1. Name of Medication: Hydralazine  2. How are you currently taking this medication (dosage and times per day)? 10mg    3. Are you having a reaction (difficulty breathing--STAT)? No  4. What is your medication issue? Pt is sweating, weakness, feels terrible and diarrhea

## 2015-02-18 NOTE — Telephone Encounter (Signed)
No answer no voice mail  

## 2015-02-19 MED ORDER — CLONIDINE HCL 0.1 MG PO TABS: 0.1000 mg | ORAL_TABLET | Freq: Two times a day (BID) | ORAL | Status: DC

## 2015-02-19 NOTE — Telephone Encounter (Signed)
Pt called back to our office today as she realized that her phone was off the hook. Pt has stopped taking the Hydralazine for the past 2 days and the symptoms have gotten better.  Pt concerns shared with flex, recommended that pt start Clonidine 0.1 mg BID. Pt was reluctant to start this medication as she stated she cannot keep buying all these medications. Reviewed [facility] $4 list and it was on there, she can go pick it up from preferred [facility].  Pt  Told she can pick up and not worry about finding her insurance, as she is legally blind and her daughter would have to come find card before going to pick up medication.  Pt very appreciative and no additional questions at this time.

## 2015-02-22 ENCOUNTER — Encounter: Payer: Self-pay | Admitting: Internal Medicine

## 2015-02-22 ENCOUNTER — Ambulatory Visit (INDEPENDENT_AMBULATORY_CARE_PROVIDER_SITE_OTHER): Payer: Medicare Other | Admitting: Internal Medicine

## 2015-02-22 VITALS — BP 124/60 | HR 72 | Ht 62.4 in | Wt 138.4 lb

## 2015-02-22 DIAGNOSIS — E785 Hyperlipidemia, unspecified: Secondary | ICD-10-CM

## 2015-02-22 DIAGNOSIS — M5412 Radiculopathy, cervical region: Secondary | ICD-10-CM | POA: Diagnosis not present

## 2015-02-22 DIAGNOSIS — R0602 Shortness of breath: Secondary | ICD-10-CM | POA: Diagnosis not present

## 2015-02-22 DIAGNOSIS — M5416 Radiculopathy, lumbar region: Secondary | ICD-10-CM | POA: Diagnosis not present

## 2015-02-22 DIAGNOSIS — M545 Low back pain: Secondary | ICD-10-CM | POA: Diagnosis not present

## 2015-02-22 DIAGNOSIS — I10 Essential (primary) hypertension: Secondary | ICD-10-CM | POA: Diagnosis not present

## 2015-02-22 DIAGNOSIS — Z6826 Body mass index (BMI) 26.0-26.9, adult: Secondary | ICD-10-CM | POA: Diagnosis not present

## 2015-02-22 DIAGNOSIS — M542 Cervicalgia: Secondary | ICD-10-CM | POA: Diagnosis not present

## 2015-02-22 MED ORDER — TRIAMTERENE-HCTZ 37.5-25 MG PO TABS: ORAL_TABLET | ORAL | Status: DC

## 2015-02-22 NOTE — Patient Instructions (Addendum)
Your physician recommends that you schedule a follow-up appointment in: 6 weeks with Dr Harrington Challenger    Restart Maxzide 37.5-25 mg,take 1/2 tablet daily   Take Clonidine 0.1 mg for the next 3 days and then STOP   Take Nexium at night    Your physician has requested that you have a lexiscan myoview NEXT week,early APT. For further information please visit [email or web address]. Please follow instruction sheet, as given.    Thank you for choosing [facility] !

## 2015-02-22 NOTE — Progress Notes (Signed)
Cardiology Office Note   Date:  02/22/2015   ID:  [name], [name] 1930/07/13, MRN [id number]  PCP:  Merrilee Seashore, MD  Cardiologist:   Dorris Carnes, MD    F/U of CAD and HTN  History of Present Illness: [name] is a 81 y.o. female with a history ofBOOP and CAD (s/p IWMI with stent ot RCA. repeat stnet to the RCA for instent restenois. She is also s/p DES to L main. Last cath In summer 2011 showed: 40% LAD lesion; 50% Lcx; small OM1 with 80% lesion; RCA with 20% instent restenosis This was stable. Pt continued on medical Rx.Denies CP Staying active.  I saw her in July  She complained of ankle selling and SOB  I recomm stopping amlodipine and starting maxzide 1/2 bid.  Didn't tolearte  Tried hydralazine  10 then 20 mg 3x per day  She felt terrible with sweating, eweakness, diarrhea.  Stopped  Switched to clionidine    Very SOB when does things   Vision blurrier on clonidine  Felt better on maxzide   Chest pressure when lies down at night       Current Outpatient Prescriptions  Medication Sig Dispense Refill  . aspirin EC 81 MG tablet Take 1 tablet (81 mg total) by mouth daily. 90 tablet 3  . cloNIDine (CATAPRES) 0.1 MG tablet Take 1 tablet (0.1 mg total) by mouth 2 (two) times daily. 60 tablet 0  . esomeprazole (NEXIUM) 40 MG capsule Take 1 capsule (40 mg total) by mouth daily before breakfast. 30 capsule 6  . imipramine (TOFRANIL) 25 MG tablet Take 75 mg by mouth at bedtime.     . insulin lispro (HUMALOG) 100 UNIT/ML injection Take 6 units in AM, 6 units at noon and 4 units at bedtime.    [name] levothyroxine (SYNTHROID, LEVOTHROID) 100 MCG tablet Take 100 mcg by mouth daily before breakfast.    . mometasone (NASONEX) 50 MCG/ACT nasal spray Place 2 sprays into the nose as needed.     . triazolam (HALCION) 0.25 MG tablet Take 0.25 mg by mouth at bedtime as needed. For sleep     No current facility-administered medications for this visit.    Allergies:    Lantus; Levaquin; Maxidex; Metoprolol-hydrochlorothiazide; Norvasc; Novolog; Sulfonamide derivatives; Titanium; and Adhesive   Past Medical History  Diagnosis Date  . Coronary artery disease   . Dressler's syndrome   . Hypertension   . Dyslipidemia   . Diabetes mellitus   . CVA (cerebral infarction) 1991  . Macular degeneration   . GERD (gastroesophageal reflux disease)   . Diverticulosis   . IBS (irritable bowel syndrome)   . Zenker's diverticulum   . Emphysema   . Pulmonary hypertension   . Chronic sinusitis   . Fibromyalgia   . Hypothyroidism   . Sjogren's disease   . Raynaud's phenomenon   . Cervical disc disease   . Adenomatous colon polyp   . Scleroderma   . COPD (chronic obstructive pulmonary disease)   . Arthritis   . Blood transfusion   . Myocardial infarction may 27th 2007  . Depression   . 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 Cancer     cervical cancer pre hysterectomy  . Sleep apnea     STOP BANG SCORE 5  . Legally blind   . CONSTIPATION, CHRONIC 06/26/2007    Qualifier: Diagnosis of  By: Nils Pyle CMA ([place]), Mearl Latin    . Pneumonia  07/16/2013     HX PNEUMONIA  . Pneumonia     Past Surgical History  Procedure Laterality Date  . Total abdominal hysterectomy  1973  . Bilateral oophorectomy  2002  . Spinal fusion  1997    and implantation of a plate   . Lobe thyroid removal    . Thyroid lobectomy  1991    removal of left lobe thyroid  . Cholecystectomy  1965  . Appendectomy    . Cataract extraction    . Bronchoscopy  02-22-09  . Stented cardiac  artery  2007 / 2007 / 2010    X 3 STENT  . Proctoscopy  03/18/2012    Procedure: PROCTOSCOPY;  Surgeon: Adin Hector, MD;  [facility];  Service: General;  Laterality: N/A;  rigid procto  . Colon surgery  2014    colon resection  . Esophagogastroduodenoscopy (egd) with esophageal dilation N/A 06/18/2013    Procedure: ESOPHAGOGASTRODUODENOSCOPY (EGD) WITH ESOPHAGEAL DILATION;   Surgeon: Inda Castle, MD;  Location: [place];  Service: Endoscopy;  Laterality: N/A;     Social History:  The patient  reports that she quit smoking about 26 years ago. Her smoking use included Cigarettes. She has a 40 pack-year smoking history. She has never used smokeless tobacco. She reports that she does not drink alcohol or use illicit drugs.   Family History:  The patient's family history includes Cancer in her brother and sister; Heart disease in her father; Other (age of onset: 50) in her mother; Stroke (age of onset: 88) in her father.    ROS:  Please see the history of present illness. All other systems are reviewed and  Negative to the above problem except as noted.    PHYSICAL EXAM: VS:  BP 124/60 mmHg  Pulse 72  Ht 5' 2.4" (1.585 m)  Wt 138 lb 6.4 oz (62.778 kg)  BMI 24.99 kg/m2  GEN: Well nourished, well developed, in no acute distress HEENT: normal Neck: no JVD, carotid bruits, or masses Cardiac: RRR; no murmurs, rubs, or gallops,no edema  Respiratory:  clear to auscultation bilaterally, normal work of breathing GI: soft, nontender, nondistended, + BS  No hepatomegaly  MS: no deformity Moving all extremities   Skin: warm and dry, no rash Neuro:  Strength and sensation are intact Psych: euthymic mood, full affect   EKG:  EKG is ordered today.  SR 72  Nonspecific ST T wave changes      Lipid Panel    Component Value Date/Time   CHOL 175 11/14/2013 0945   TRIG 113.0 11/14/2013 0945   HDL 54.00 11/14/2013 0945   CHOLHDL 3 11/14/2013 0945   VLDL 22.6 11/14/2013 0945   LDLCALC 98 11/14/2013 0945   LDLDIRECT 127.6 05/15/2013 1422      Wt Readings from Last 3 Encounters:  02/22/15 138 lb 6.4 oz (62.778 kg)  01/29/15 141 lb (63.957 kg)  01/22/15 142 lb 12.8 oz (64.774 kg)      ASSESSMENT AND PLAN:  1  HTN  Bp is better but she feels bad  She says when she does anything it goes up  She says she felt better on maxzide. Will give clonidine 0.1 qhs  times 3 then stop Restart maxzide 1/2 of 37.5/25  Follow BP  2.  CAD Will set up for [place] she cant do what she used to   3.  HL  Has not tolerated statins in past  Will get lipids when comes  in for myoview    F/U in 4 to 6 wks  Tests as noted     Signed, Dorris Carnes, MD  02/22/2015 4:32 PM    [facility] [place], [place], [place]  [address] Phone: [phone]; Fax: [phone]

## 2015-02-25 ENCOUNTER — Telehealth: Payer: Self-pay | Admitting: Internal Medicine

## 2015-02-25 ENCOUNTER — Other Ambulatory Visit (HOSPITAL_COMMUNITY): Payer: Self-pay | Admitting: Neurosurgery

## 2015-02-25 DIAGNOSIS — M19019 Primary osteoarthritis, unspecified shoulder: Secondary | ICD-10-CM

## 2015-02-25 DIAGNOSIS — M5412 Radiculopathy, cervical region: Secondary | ICD-10-CM

## 2015-02-25 DIAGNOSIS — E871 Hypo-osmolality and hyponatremia: Secondary | ICD-10-CM

## 2015-02-25 NOTE — Telephone Encounter (Signed)
Spoke with pt and informed her of lab results. Informed her that Dr. Harrington Challenger would like to recheck BMET Friday. Pt verbalized understanding and said to schedule lab for tomorrow but she didn't know if she would have a ride. Advised pt to call the office if she was going to be unable to make it. Pt verbalized understanding and was in agreement with this plan.

## 2015-02-25 NOTE — Telephone Encounter (Signed)
New message     Pt is returning Michalene's call regarding lab results

## 2015-02-26 ENCOUNTER — Other Ambulatory Visit (INDEPENDENT_AMBULATORY_CARE_PROVIDER_SITE_OTHER): Payer: Medicare Other | Admitting: *Deleted

## 2015-02-26 DIAGNOSIS — E871 Hypo-osmolality and hyponatremia: Secondary | ICD-10-CM | POA: Diagnosis not present

## 2015-02-26 LAB — BASIC METABOLIC PANEL
BUN: 22 mg/dL (ref 6–23)
CO2: 30 mEq/L (ref 19–32)
Calcium: 8.9 mg/dL (ref 8.4–10.5)
Chloride: 98 mEq/L (ref 96–112)
Creatinine, Ser: 0.78 mg/dL (ref 0.40–1.20)
GFR: 75.13 mL/min (ref >60.00)
Glucose, Bld: 78 mg/dL (ref 70–99)
Potassium: 4 mEq/L (ref 3.5–5.1)
Sodium: 137 mEq/L (ref 135–145)

## 2015-03-02 ENCOUNTER — Telehealth (HOSPITAL_COMMUNITY): Payer: Self-pay | Admitting: *Deleted

## 2015-03-02 ENCOUNTER — Telehealth: Payer: Self-pay | Admitting: Internal Medicine

## 2015-03-02 NOTE — Telephone Encounter (Signed)
Gave patient lab results

## 2015-03-02 NOTE — Telephone Encounter (Signed)
Left message on voicemail in reference to upcoming appointment scheduled for 03/04/15. Phone number given for a call back so details instructions can be given. Phelan Schadt, Ranae Palms

## 2015-03-02 NOTE — Telephone Encounter (Signed)
Patient given detailed instructions per Myocardial Perfusion Study Information Sheet for test on 03/04/15 at 0700. Patient notified to arrive 15 minutes early and that it is imperative to arrive on time for appointment to keep from having the test rescheduled.  If you need to cancel or reschedule your appointment, please call the office within 24 hours of your appointment. Failure to do so may result in a cancellation of your appointment, and a $50 no show fee. Patient verbalized understanding. Kelsi Benham, Ranae Palms

## 2015-03-02 NOTE — Telephone Encounter (Signed)
New message ° ° ° ° °Pt returning your call °

## 2015-03-03 ENCOUNTER — Encounter (HOSPITAL_COMMUNITY): Payer: Medicare Other

## 2015-03-03 ENCOUNTER — Telehealth: Payer: Self-pay | Admitting: Internal Medicine

## 2015-03-03 ENCOUNTER — Other Ambulatory Visit: Payer: Medicare Other

## 2015-03-03 MED ORDER — TRIAMTERENE-HCTZ 37.5-25 MG PO TABS: ORAL_TABLET | ORAL | Status: DC

## 2015-03-03 NOTE — Telephone Encounter (Signed)
Pt c/o medication issue:  1. Name of Medication: Clonidine   2. How are you currently taking this medication (dosage and times per day)? Pt doesn't know  3. Are you having a reaction (difficulty breathing--STAT)?   4. What is your medication issue? Swelling in belly, legs, and feet. Please call back and advise.

## 2015-03-03 NOTE — Telephone Encounter (Signed)
Reviewed previous OV note by Dr. Harrington Challenger:   1 HTN Bp is better but she feels bad She says when she does anything it goes up She says she felt better on maxzide. Will give clonidine 0.1 qhs times 3 then stop Restart maxzide 1/2 of 37.5/25 Follow BP   Pt did not realize/understand those instructions-- She has not been taking maxzide/ran out--adv script was sent--[facility] delivers her medicines.  It was sent to the [facility]. Reordered medication to [facility], called them.  They will deliver the medication today and pt will take today. Advised this should alleviate her swelling.  Adv to call back on Friday if not getting better, or sooner if getting worse.  Pt has appointment to return 04/01/15    Advised her to stop clonidine and restart maxzide 1/2 tablet. Advised her to monitor Bp (keep a list and bring to next appointment)  She verbalizes understanding and agreement with the above plan and is appreciative for information.

## 2015-03-04 ENCOUNTER — Ambulatory Visit (HOSPITAL_COMMUNITY): Payer: Medicare Other | Attending: Internal Medicine

## 2015-03-04 ENCOUNTER — Other Ambulatory Visit (INDEPENDENT_AMBULATORY_CARE_PROVIDER_SITE_OTHER): Payer: Medicare Other | Admitting: *Deleted

## 2015-03-04 DIAGNOSIS — Z955 Presence of coronary angioplasty implant and graft: Secondary | ICD-10-CM | POA: Insufficient documentation

## 2015-03-04 DIAGNOSIS — I2510 Atherosclerotic heart disease of native coronary artery without angina pectoris: Secondary | ICD-10-CM | POA: Diagnosis not present

## 2015-03-04 DIAGNOSIS — R079 Chest pain, unspecified: Secondary | ICD-10-CM | POA: Insufficient documentation

## 2015-03-04 DIAGNOSIS — E119 Type 2 diabetes mellitus without complications: Secondary | ICD-10-CM | POA: Insufficient documentation

## 2015-03-04 DIAGNOSIS — R0602 Shortness of breath: Secondary | ICD-10-CM | POA: Diagnosis not present

## 2015-03-04 DIAGNOSIS — E785 Hyperlipidemia, unspecified: Secondary | ICD-10-CM | POA: Diagnosis not present

## 2015-03-04 DIAGNOSIS — I10 Essential (primary) hypertension: Secondary | ICD-10-CM | POA: Insufficient documentation

## 2015-03-04 LAB — LIPID PANEL
Cholesterol: 178 mg/dL (ref 0–200)
HDL: 48.3 mg/dL (ref >39.00)
LDL Cholesterol: 113 mg/dL — ABNORMAL HIGH (ref 0–99)
NonHDL: 129.85
Total CHOL/HDL Ratio: 4
Triglycerides: 86 mg/dL (ref 0.0–149.0)
VLDL: 17.2 mg/dL (ref 0.0–40.0)

## 2015-03-04 LAB — MYOCARDIAL PERFUSION IMAGING
LV dias vol: 45 mL
LV sys vol: 11 mL
Peak HR: 82 {beats}/min
RATE: 0.31
Rest HR: 67 {beats}/min
SDS: 0
SRS: 0
SSS: 0
TID: 0.92

## 2015-03-04 LAB — BASIC METABOLIC PANEL
BUN: 16 mg/dL (ref 6–23)
CO2: 30 mEq/L (ref 19–32)
Calcium: 9.2 mg/dL (ref 8.4–10.5)
Chloride: 100 mEq/L (ref 96–112)
Creatinine, Ser: 0.84 mg/dL (ref 0.40–1.20)
GFR: 68.97 mL/min (ref >60.00)
Glucose, Bld: 118 mg/dL — ABNORMAL HIGH (ref 70–99)
Potassium: 4 mEq/L (ref 3.5–5.1)
Sodium: 136 mEq/L (ref 135–145)

## 2015-03-04 MED ORDER — REGADENOSON 0.4 MG/5ML IV SOLN
0.4000 mg | Freq: Once | INTRAVENOUS | Status: AC
  Administered 2015-03-04: 0.4 mg via INTRAVENOUS

## 2015-03-04 MED ORDER — TECHNETIUM TC 99M SESTAMIBI GENERIC - CARDIOLITE
10.2000 | Freq: Once | INTRAVENOUS | Status: AC | PRN
  Administered 2015-03-04: 10 via INTRAVENOUS

## 2015-03-04 MED ORDER — TECHNETIUM TC 99M SESTAMIBI GENERIC - CARDIOLITE
31.4000 | Freq: Once | INTRAVENOUS | Status: AC | PRN
  Administered 2015-03-04: 31 via INTRAVENOUS

## 2015-03-08 ENCOUNTER — Encounter (HOSPITAL_COMMUNITY): Payer: Self-pay | Admitting: *Deleted

## 2015-03-08 DIAGNOSIS — M5030 Other cervical disc degeneration, unspecified cervical region: Secondary | ICD-10-CM | POA: Diagnosis not present

## 2015-03-08 DIAGNOSIS — M25572 Pain in left ankle and joints of left foot: Secondary | ICD-10-CM | POA: Diagnosis not present

## 2015-03-08 DIAGNOSIS — M340 Progressive systemic sclerosis: Secondary | ICD-10-CM | POA: Diagnosis not present

## 2015-03-08 DIAGNOSIS — J189 Pneumonia, unspecified organism: Secondary | ICD-10-CM | POA: Diagnosis not present

## 2015-03-08 DIAGNOSIS — M5136 Other intervertebral disc degeneration, lumbar region: Secondary | ICD-10-CM | POA: Diagnosis not present

## 2015-03-08 DIAGNOSIS — I7300 Raynaud's syndrome without gangrene: Secondary | ICD-10-CM | POA: Diagnosis not present

## 2015-03-08 NOTE — Progress Notes (Signed)
Pt denies SOB and chest pain but is under the care of Dr. Harrington Challenger, cardiology, at [facility]. Pt made aware to stop otc vitamins and herbal medications. Pt verbalized understanding of all pre-op instructions.

## 2015-03-09 ENCOUNTER — Ambulatory Visit (HOSPITAL_COMMUNITY): Payer: Medicare Other

## 2015-03-09 ENCOUNTER — Ambulatory Visit (HOSPITAL_COMMUNITY): Payer: Medicare Other | Admitting: Certified Registered Nurse Anesthetist

## 2015-03-09 ENCOUNTER — Ambulatory Visit (HOSPITAL_COMMUNITY)
Admission: RE | Admit: 2015-03-09 | Discharge: 2015-03-09 | Disposition: A | Discharge status: Home / Self Care | Payer: Medicare Other | Source: Ambulatory Visit | Attending: Neurosurgery | Admitting: Neurosurgery

## 2015-03-09 ENCOUNTER — Encounter (HOSPITAL_COMMUNITY)
Admission: RE | Disposition: A | Discharge status: Home / Self Care | Payer: Self-pay | Source: Ambulatory Visit | Attending: Neurosurgery

## 2015-03-09 ENCOUNTER — Ambulatory Visit (HOSPITAL_COMMUNITY): Admission: RE | Admit: 2015-03-09 | Payer: Medicare Other | Source: Ambulatory Visit

## 2015-03-09 ENCOUNTER — Telehealth: Payer: Self-pay | Admitting: Internal Medicine

## 2015-03-09 DIAGNOSIS — I2510 Atherosclerotic heart disease of native coronary artery without angina pectoris: Secondary | ICD-10-CM | POA: Diagnosis not present

## 2015-03-09 DIAGNOSIS — M5136 Other intervertebral disc degeneration, lumbar region: Secondary | ICD-10-CM | POA: Diagnosis not present

## 2015-03-09 DIAGNOSIS — M19019 Primary osteoarthritis, unspecified shoulder: Secondary | ICD-10-CM

## 2015-03-09 DIAGNOSIS — M545 Low back pain: Secondary | ICD-10-CM | POA: Insufficient documentation

## 2015-03-09 DIAGNOSIS — M542 Cervicalgia: Secondary | ICD-10-CM | POA: Diagnosis not present

## 2015-03-09 DIAGNOSIS — M5412 Radiculopathy, cervical region: Secondary | ICD-10-CM

## 2015-03-09 DIAGNOSIS — M4602 Spinal enthesopathy, cervical region: Secondary | ICD-10-CM | POA: Insufficient documentation

## 2015-03-09 DIAGNOSIS — M25512 Pain in left shoulder: Secondary | ICD-10-CM | POA: Diagnosis not present

## 2015-03-09 DIAGNOSIS — M5023 Other cervical disc displacement, cervicothoracic region: Secondary | ICD-10-CM | POA: Diagnosis not present

## 2015-03-09 DIAGNOSIS — M4806 Spinal stenosis, lumbar region: Secondary | ICD-10-CM | POA: Diagnosis not present

## 2015-03-09 HISTORY — PX: RADIOLOGY WITH ANESTHESIA: SHX6223

## 2015-03-09 LAB — GLUCOSE, CAPILLARY
Glucose-Capillary: 121 mg/dL — ABNORMAL HIGH (ref 65–99)
Glucose-Capillary: 114 mg/dL — ABNORMAL HIGH (ref 65–99)

## 2015-03-09 SURGERY — RADIOLOGY WITH ANESTHESIA
Anesthesia: Monitor Anesthesia Care

## 2015-03-09 MED ORDER — PROPOFOL 500 MG/50ML IV EMUL
INTRAVENOUS | Status: AC
  Filled 2015-03-09: qty 150

## 2015-03-09 MED ORDER — LACTATED RINGERS IV SOLN
INTRAVENOUS | Status: DC
  Administered 2015-03-09: 10:00:00 via INTRAVENOUS

## 2015-03-09 MED ORDER — PROPOFOL 1000 MG/100ML IV EMUL
INTRAVENOUS | Status: AC
  Filled 2015-03-09: qty 100

## 2015-03-09 NOTE — Telephone Encounter (Signed)
Informed pt of lab results. Pt verbalized understanding. 

## 2015-03-09 NOTE — Transfer of Care (Signed)
Immediate Anesthesia Transfer of Care Note  Patient: [name]  Procedure(s) Performed: Procedure(s): MRI CERVICAL SPINE WITHOUT AND LUMBER WITHOUT (N/A)  Patient Location: PACU  Anesthesia Type:MAC  Level of Consciousness: awake, alert  and oriented  Airway & Oxygen Therapy: Patient Spontanous Breathing and Patient connected to nasal cannula oxygen  Post-op Assessment: Report given to RN, Post -op Vital signs reviewed and stable and Patient moving all extremities  Post vital signs: Reviewed and stable  Last Vitals:  Filed Vitals:   03/09/15 0814  BP: 131/47  Pulse: 81  Temp: 36.9 C  Resp: 20    Complications: No apparent anesthesia complications

## 2015-03-09 NOTE — Anesthesia Preprocedure Evaluation (Addendum)
Anesthesia Evaluation  Patient identified by MRN, date of birth, ID band Patient awake    Reviewed: Allergy & Precautions, NPO status , Patient's Chart, lab work & pertinent test results  History of Anesthesia Complications Negative for: history of anesthetic complications  Airway Mallampati: II  TM Distance: >3 FB Neck ROM: Limited    Dental no notable dental hx. (+) Dental Advisory Given, Partial Upper   Pulmonary sleep apnea , COPD, former smoker,    Pulmonary exam normal breath sounds clear to auscultation       Cardiovascular hypertension, + CAD and + Past MI  negative cardio ROS Normal cardiovascular exam Rhythm:Regular Rate:Normal  03/05/15     Nuclear stress EF: 76%.     There was no ST segment deviation noted during stress.     The study is normal.     This is a low risk study.     The left ventricular ejection fraction is hyperdynamic (>65%).  Normal study. No ischemia. Vigorous LV systolic function.    Neuro/Psych PSYCHIATRIC DISORDERS Depression CVA    GI/Hepatic negative GI ROS, Neg liver ROS, GERD  Medicated and Controlled,  Endo/Other  negative endocrine ROSdiabetesHypothyroidism   Renal/GU negative Renal ROS  negative genitourinary   Musculoskeletal  (+) Arthritis , Fibromyalgia -  Abdominal   Peds negative pediatric ROS (+)  Hematology negative hematology ROS (+)   Anesthesia Other Findings   Reproductive/Obstetrics negative OB ROS                         Anesthesia Physical Anesthesia Plan  ASA: III  Anesthesia Plan: MAC   Post-op Pain Management:    Induction: Intravenous  Airway Management Planned:   Additional Equipment:   Intra-op Plan:   Post-operative Plan:   Informed Consent: I have reviewed the patients History and Physical, chart, labs and discussed the procedure including the risks, benefits and alternatives for the proposed anesthesia  with the patient or authorized representative who has indicated his/her understanding and acceptance.   Dental advisory given  Plan Discussed with:   Anesthesia Plan Comments: (Discussed extensively with the patient her options for anesthesia. She does not want any kind of breathing device, LMA or ETT, for the procedure unless it is an emergency situation. We will attempt a MAC with propofol infusion and then if unable to tolerate procedure, will stop MRI. )       Anesthesia Quick Evaluation

## 2015-03-09 NOTE — Telephone Encounter (Signed)
New Message   Pt states she wants to inform Dr. Harrington Challenger that she is home and she can   Call and give the results

## 2015-03-10 ENCOUNTER — Encounter (HOSPITAL_COMMUNITY): Payer: Self-pay | Admitting: Radiology

## 2015-03-10 NOTE — Anesthesia Postprocedure Evaluation (Signed)
  Anesthesia Post-op Note  Patient: [name]  Procedure(s) Performed: Procedure(s) (LRB): MRI CERVICAL SPINE WITHOUT AND LUMBER WITHOUT (N/A)  Patient Location: PACU  Anesthesia Type: MAC  Level of Consciousness: awake and alert   Airway and Oxygen Therapy: Patient Spontanous Breathing  Post-op Pain: mild  Post-op Assessment: Post-op Vital signs reviewed, Patient's Cardiovascular Status Stable, Respiratory Function Stable, Patent Airway and No signs of Nausea or vomiting  Last Vitals:  Filed Vitals:   03/09/15 1243  BP: 133/45  Pulse:   Temp:   Resp:     Post-op Vital Signs: stable   Complications: No apparent anesthesia complications

## 2015-03-16 ENCOUNTER — Ambulatory Visit: Payer: Medicare Other | Admitting: Pulmonary Disease

## 2015-03-18 ENCOUNTER — Ambulatory Visit (INDEPENDENT_AMBULATORY_CARE_PROVIDER_SITE_OTHER): Payer: Medicare Other | Admitting: Pulmonary Disease

## 2015-03-18 ENCOUNTER — Encounter: Payer: Self-pay | Admitting: Pulmonary Disease

## 2015-03-18 VITALS — BP 138/54 | HR 88 | Ht 62.0 in | Wt 139.0 lb

## 2015-03-18 DIAGNOSIS — J329 Chronic sinusitis, unspecified: Secondary | ICD-10-CM | POA: Diagnosis not present

## 2015-03-18 DIAGNOSIS — J8489 Other specified interstitial pulmonary diseases: Secondary | ICD-10-CM | POA: Diagnosis not present

## 2015-03-18 DIAGNOSIS — M349 Systemic sclerosis, unspecified: Secondary | ICD-10-CM | POA: Diagnosis not present

## 2015-03-18 MED ORDER — AMOXICILLIN-POT CLAVULANATE 875-125 MG PO TABS
1.0000 | ORAL_TABLET | Freq: Two times a day (BID) | ORAL | Status: DC
Start: 2015-03-18 — End: 2015-04-02

## 2015-03-18 MED FILL — Propofol IV Emul 200 MG/20ML (10 MG/ML): INTRAVENOUS | Qty: 20 | Status: AC

## 2015-03-18 MED FILL — Lactated Ringer's Solution: INTRAVENOUS | Qty: 1000 | Status: AC

## 2015-03-18 MED FILL — Phenylephrine-NaCl Pref Syr 0.4 MG/10ML-0.9% (40 MCG/ML): INTRAVENOUS | Qty: 10 | Status: AC

## 2015-03-18 NOTE — Progress Notes (Signed)
Chief Complaint  Patient presents with  . Follow-up    pt states she has an increase in SOB this week and she is not sure why. pt states she has a dry cough and at times productive.     History of Present Illness: [name] is a 81 y.o. 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  She has another sinus infection.  This started about a week ago.  She has sinus pressure and drainage.  She also feels fatigued and feverish.  She has cough with sputum.  She can't see the color of the phlegm.  She denies chest or abdominal pain.   TESTS: August 2010 >> Bronchoscopy PFT 06/16/09 >> FEV1 1.99(118%), FEV1% 73, TLC 4.60(105%), DLCO 66%, no BD CT chest 11/24/11 >> centrilobular emphysema, RUL ASD CT sinus 07/30/13 >> opacification of Lt maxillary sinus, rightward NSD  PMHx >> CAD, HTN, HLD, DM, CVA, Macular degeneration, GERD, Dysphagia, IBS, Depression, Fibromyalgia, Hypothyroidism, Sjogren syndrome, Raynaud's phenomenon, Scleroderma  PSHx, Medications, Allergies, Fhx, Shx reviewed.   Physical Exam: BP 138/54 mmHg  Pulse 88  Ht 5\' 2"  (1.575 m)  Wt 139 lb (63.05 kg)  BMI 25.42 kg/m2  SpO2 97%  General - no distress HEENT - stable lesion on tongue, tender in frontal and maxillary sinus region, clear to yellow nasal drainage, TM clear, no oral exudate, no LAN Cardiac - s1s2 regular, no murmur  Chest - no wheeze Abd - soft, nontender  Ext - no edema, skin tightening over fingers Neuro - normal strength  Psych - normal mood/behavior    Assessment/Plan:  Recurrent sinus infections. Plan: - will give course of augmentin - continue nasonex - she has been seen previously by Dr. Janace Hoard with ENT  BOOP in setting of scleroderma. Plan: - continue 5 mg prednisone  Hx of tobacco abuse with emphysema >> she has not noticed benefit from previous trials of inhaler therapy. Plan: - monitor  clinically  Hx of Scleroderma. Plan: - f/u with rheumatology  Chesley Mires, MD [place] 03/18/2015, 3:06 PM Pager:  [phone]

## 2015-03-18 NOTE — Patient Instructions (Signed)
Augmentin 1 pill twice per day for 7 days >> can refill if not fully recovered  Call if not feeling better  Follow up in 4 months

## 2015-04-01 ENCOUNTER — Ambulatory Visit: Payer: Medicare Other | Admitting: Internal Medicine

## 2015-04-02 ENCOUNTER — Encounter: Payer: Self-pay | Admitting: Internal Medicine

## 2015-04-02 ENCOUNTER — Ambulatory Visit (INDEPENDENT_AMBULATORY_CARE_PROVIDER_SITE_OTHER): Payer: Medicare Other | Admitting: Internal Medicine

## 2015-04-02 VITALS — BP 128/56 | HR 85 | Ht 62.0 in | Wt 137.1 lb

## 2015-04-02 DIAGNOSIS — R0602 Shortness of breath: Secondary | ICD-10-CM

## 2015-04-02 DIAGNOSIS — E876 Hypokalemia: Secondary | ICD-10-CM

## 2015-04-02 DIAGNOSIS — I509 Heart failure, unspecified: Secondary | ICD-10-CM | POA: Diagnosis not present

## 2015-04-02 DIAGNOSIS — E785 Hyperlipidemia, unspecified: Secondary | ICD-10-CM

## 2015-04-02 DIAGNOSIS — E871 Hypo-osmolality and hyponatremia: Secondary | ICD-10-CM | POA: Diagnosis not present

## 2015-04-02 DIAGNOSIS — I10 Essential (primary) hypertension: Secondary | ICD-10-CM | POA: Diagnosis not present

## 2015-04-02 LAB — CBC WITH DIFFERENTIAL/PLATELET
Basophils Absolute: 0.1 10*3/uL (ref 0.0–0.1)
Basophils Relative: 1 % (ref 0–1)
Eosinophils Absolute: 0.3 10*3/uL (ref 0.0–0.7)
Eosinophils Relative: 3 % (ref 0–5)
HCT: 38.8 % (ref 36.0–46.0)
Hemoglobin: 13.6 g/dL (ref 12.0–15.0)
Lymphocytes Relative: 33 % (ref 12–46)
Lymphs Abs: 3.6 10*3/uL (ref 0.7–4.0)
MCH: 28.2 pg (ref 26.0–34.0)
MCHC: 35.1 g/dL (ref 30.0–36.0)
MCV: 80.3 fL (ref 78.0–100.0)
MPV: 9.1 fL (ref 8.6–12.4)
Monocytes Absolute: 1 10*3/uL (ref 0.1–1.0)
Monocytes Relative: 9 % (ref 3–12)
Neutro Abs: 5.9 10*3/uL (ref 1.7–7.7)
Neutrophils Relative %: 54 % (ref 43–77)
Platelets: 252 10*3/uL (ref 150–400)
RBC: 4.83 MIL/uL (ref 3.87–5.11)
RDW: 13.8 % (ref 11.5–15.5)
WBC: 10.9 10*3/uL — ABNORMAL HIGH (ref 4.0–10.5)

## 2015-04-02 LAB — BASIC METABOLIC PANEL
BUN: 24 mg/dL (ref 7–25)
CO2: 27 mmol/L (ref 20–31)
Calcium: 9.1 mg/dL (ref 8.6–10.4)
Chloride: 85 mmol/L — ABNORMAL LOW (ref 98–110)
Creat: 0.91 mg/dL — ABNORMAL HIGH (ref 0.60–0.88)
Glucose, Bld: 245 mg/dL — ABNORMAL HIGH (ref 65–99)
Potassium: 3.5 mmol/L (ref 3.5–5.3)
Sodium: 125 mmol/L — ABNORMAL LOW (ref 135–146)

## 2015-04-02 LAB — SEDIMENTATION RATE: Sed Rate: 9 mm/hr (ref 0–30)

## 2015-04-02 NOTE — Progress Notes (Signed)
Cardiology Office Note   Date:  04/02/2015   ID:  [name], DOB March 16, 1931, MRN [id number]  PCP:  Merrilee Seashore, MD  Cardiologist:   Dorris Carnes, MD   Chief Complaint  Patient presents with  . Coronary Artery Disease      History of Present Illness: [name] is a 81 y.o. female with a history of fBOOP and CAD (s/p IWMI with stent ot RCA. repeat stnet to the RCA for instent restenois. She is also s/p DES to L main. Last cath In summer 2011 showed: 40% LAD lesion; 50% Lcx; small OM1 with 80% lesion; RCA with 20% instent restenosis This was stable. Pt continued on medical Rx.Denies CP Staying active.  I saw her in July She complained of ankle selling and SOB I recomm stopping amlodipine and starting maxzide 1/2 bid. Didn't tolearte Tried hydralazine 10 then 20 mg 3x per day She felt terrible with sweating, eweakness, diarrhea. Stopped Switched to clionidine  I saw her in August  Complained of SOB  Myoview done was normal  I also had er taper clonidine and restart maxzide.    Since seen she has not felt good  Ankles and knees hurt  Cant walk   SOB with activty  No CP or indigestion (like her prior angina). Just  Gives out  Due to see Vickie Epley in 2 wks.      Current Outpatient Prescriptions  Medication Sig Dispense Refill  . aspirin EC 81 MG tablet Take 1 tablet (81 mg total) by mouth daily. 90 tablet 3  . esomeprazole (NEXIUM) 40 MG capsule Take 1 capsule (40 mg total) by mouth daily before breakfast. 30 capsule 6  . imipramine (TOFRANIL) 25 MG tablet Take 75 mg by mouth at bedtime.     . insulin lispro (HUMALOG) 100 UNIT/ML injection Take 6 units in AM, 6 units at noon and 4 units at bedtime.    [name] levothyroxine (SYNTHROID, LEVOTHROID) 100 MCG tablet Take 100 mcg by mouth daily before breakfast.    . mometasone (NASONEX) 50 MCG/ACT nasal spray Place 2 sprays into the nose daily.     [name] OVER THE COUNTER MEDICATION Place 1-2 drops into both eyes 2  (two) times daily as needed (For dry irritated eyes.). Allergy Eye Drops    . predniSONE (DELTASONE) 5 MG tablet Take 5 mg by mouth daily with breakfast.    . triamterene-hydrochlorothiazide (MAXZIDE-25) 37.5-25 MG per tablet Take 1/2 tablet daily (Patient taking differently: Take 0.5 tablets by mouth daily. ) 30 tablet 6  . triazolam (HALCION) 0.25 MG tablet Take 0.25 mg by mouth at bedtime as needed. For sleep     No current facility-administered medications for this visit.    Allergies:   Lantus; Levaquin; Maxidex; Metoprolol-hydrochlorothiazide; Norvasc; Novolog; Sulfonamide derivatives; Titanium; and Adhesive   Past Medical History  Diagnosis Date  . Coronary artery disease   . 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[place])   . Hypertension   . Dyslipidemia   . Diabetes mellitus   . CVA (cerebral infarction) 1991  . Macular degeneration   . GERD (gastroesophageal reflux disease)   . Diverticulosis   . IBS (irritable bowel syndrome)   . Zenker's diverticulum   . Emphysema   . Pulmonary hypertension ([place])   . Chronic sinusitis   . Fibromyalgia   . Hypothyroidism   . Sjogren's disease ([place])   . Raynaud's phenomenon   . Cervical disc disease   . Adenomatous colon polyp   . Scleroderma ([place])   .  COPD (chronic obstructive pulmonary disease) ([place])   . Arthritis   . Blood transfusion   . Myocardial infarction [facility]) may 27th 2007  . Depression   . Stroke [facility]) 1990    brain stem stroke - weakness rt hand  . Cancer [facility])     cervical cancer pre hysterectomy  . Sleep apnea     STOP BANG SCORE 5  . Legally blind   . CONSTIPATION, CHRONIC 06/26/2007    Qualifier: Diagnosis of  By: Nils Pyle CMA ([place]), Mearl Latin    . Pneumonia 07/16/2013     HX PNEUMONIA  . Pneumonia   . Bronchitis     Past Surgical History  Procedure Laterality Date  . Total abdominal hysterectomy  1973  . Bilateral oophorectomy  2002  . Spinal fusion  1997    and implantation of a plate   . Lobe thyroid removal    .  Thyroid lobectomy  1991    removal of left lobe thyroid  . Cholecystectomy  1965  . Appendectomy    . Cataract extraction    . Bronchoscopy  02-22-09  . Stented cardiac  artery  2007 / 2007 / 2010    X 3 STENT  . Proctoscopy  03/18/2012    Procedure: PROCTOSCOPY;  Surgeon: Adin Hector, MD;  Location: [facility];  Service: General;  Laterality: N/A;  rigid procto  . Colon surgery  2014    colon resection  . Esophagogastroduodenoscopy (egd) with esophageal dilation N/A 06/18/2013    Procedure: ESOPHAGOGASTRODUODENOSCOPY (EGD) WITH ESOPHAGEAL DILATION;  Surgeon: Inda Castle, MD;  Location: [place];  Service: Endoscopy;  Laterality: N/A;  . Cardiac catheterization      x 3  . Colonectomy    . Radiology with anesthesia N/A 03/09/2015    Procedure: MRI CERVICAL SPINE WITHOUT AND LUMBER WITHOUT;  Surgeon: Medication Radiologist, MD;  Location: [place];  Service: Radiology;  Laterality: N/A;     Social History:  The patient  reports that she quit smoking about 26 years ago. Her smoking use included Cigarettes. She has a 40 pack-year smoking history. She has never used smokeless tobacco. She reports that she does not drink alcohol or use illicit drugs.   Family History:  The patient's family history includes Cancer in her brother and sister; Heart disease in her father; Other (age of onset: 35) in her mother; Stroke (age of onset: 58) in her father.    ROS:  Please see the history of present illness. All other systems are reviewed and  Negative to the above problem except as noted.    PHYSICAL EXAM: VS:  BP 128/56 mmHg  Pulse 85  Ht _0  (1.575 m)  Wt 137 lb 1.9 oz (62.197 kg)  BMI 25.07 kg/m2  GEN: Well nourished, well developed, in no acute distress HEENT: normal Neck: no JVD, carotid bruits, or masses Cardiac: RRR; no murmurs, rubs, or gallops,no edema  Respiratory:  clear to auscultation bilaterally, normal work of breathing GI: soft, nontender, nondistended, + BS  No  hepatomegaly  MS: no deformity Moving all extremities   Skin: warm and dry, no rash Neuro:  Strength and sensation are intact Psych: euthymic mood, full affect   EKG:  EKG is not ordered today.   Lipid Panel    Component Value Date/Time   CHOL 178 03/04/2015 0750   TRIG 86.0 03/04/2015 0750   HDL 48.30 03/04/2015 0750   CHOLHDL 4 03/04/2015 0750   VLDL 17.2 03/04/2015 0750  LDLCALC 113* 03/04/2015 0750   LDLDIRECT 127.6 05/15/2013 1422      Wt Readings from Last 3 Encounters:  04/02/15 137 lb 1.9 oz (62.197 kg)  03/18/15 139 lb (63.05 kg)  03/09/15 138 lb (62.596 kg)      ASSESSMENT AND PLAN:  1.  CAD  I am not convinced above symptoms of giving out, feeling bad are ischemic in origni  Myoview negative  Would set pt up to have ESR.  She follows with Dr Halford Chessman and Amil Amen  2.  HTN  BP is OK  Continue current regimen  3.  HL  Not tolerant of statins    Lipids are stable  Watch diet   Signed, Dorris Carnes, MD  04/02/2015 9:58 AM    [facility] [place], [place], [place]  [address] Phone: [phone]; Fax: [phone]

## 2015-04-02 NOTE — Patient Instructions (Addendum)
Medication Instructions:  Your physician recommends that you continue on your current medications as directed. Please refer to the Current Medication list given to you today.     Labwork:  TODAY:  CBC W/DIFF                 BMET                  SED RATE        Testing/Procedures: None ordered  Follow-Up: Your physician wants you to follow-up in: [place].  You will receive a reminder letter in the mail two months in advance. If you don't receive a letter, please call our office to schedule the follow-up appointment.

## 2015-04-05 ENCOUNTER — Telehealth: Payer: Self-pay

## 2015-04-05 DIAGNOSIS — E871 Hypo-osmolality and hyponatremia: Secondary | ICD-10-CM

## 2015-04-05 NOTE — Telephone Encounter (Signed)
Called patient about lab results. Per Dr. Harrington Challenger, Sodium is a little low. Make sure she is not drinking lots of water. If so should cut back to 2 L fluid (all kinds ) per day. If not then please repeat BMET in 1 wk May need to cut back on maxzide. Marker of inflammation normal. WIll forward to Drs Amil Amen and Halford Chessman. Patient verbalized understanding and will come in next week for lab.

## 2015-04-06 DIAGNOSIS — I2510 Atherosclerotic heart disease of native coronary artery without angina pectoris: Secondary | ICD-10-CM | POA: Diagnosis not present

## 2015-04-06 DIAGNOSIS — E039 Hypothyroidism, unspecified: Secondary | ICD-10-CM | POA: Diagnosis not present

## 2015-04-06 DIAGNOSIS — I10 Essential (primary) hypertension: Secondary | ICD-10-CM | POA: Diagnosis not present

## 2015-04-06 DIAGNOSIS — E782 Mixed hyperlipidemia: Secondary | ICD-10-CM | POA: Diagnosis not present

## 2015-04-07 DIAGNOSIS — E039 Hypothyroidism, unspecified: Secondary | ICD-10-CM | POA: Diagnosis not present

## 2015-04-07 DIAGNOSIS — I10 Essential (primary) hypertension: Secondary | ICD-10-CM | POA: Diagnosis not present

## 2015-04-07 DIAGNOSIS — E119 Type 2 diabetes mellitus without complications: Secondary | ICD-10-CM | POA: Diagnosis not present

## 2015-04-12 ENCOUNTER — Other Ambulatory Visit (INDEPENDENT_AMBULATORY_CARE_PROVIDER_SITE_OTHER): Payer: Medicare Other | Admitting: *Deleted

## 2015-04-12 DIAGNOSIS — M542 Cervicalgia: Secondary | ICD-10-CM | POA: Diagnosis not present

## 2015-04-12 DIAGNOSIS — E871 Hypo-osmolality and hyponatremia: Secondary | ICD-10-CM | POA: Diagnosis not present

## 2015-04-12 DIAGNOSIS — I10 Essential (primary) hypertension: Secondary | ICD-10-CM

## 2015-04-12 DIAGNOSIS — M545 Low back pain: Secondary | ICD-10-CM | POA: Diagnosis not present

## 2015-04-12 DIAGNOSIS — Z6826 Body mass index (BMI) 26.0-26.9, adult: Secondary | ICD-10-CM | POA: Diagnosis not present

## 2015-04-12 DIAGNOSIS — M7061 Trochanteric bursitis, right hip: Secondary | ICD-10-CM | POA: Diagnosis not present

## 2015-04-12 DIAGNOSIS — M5416 Radiculopathy, lumbar region: Secondary | ICD-10-CM | POA: Diagnosis not present

## 2015-04-12 LAB — BASIC METABOLIC PANEL
BUN: 29 mg/dL — ABNORMAL HIGH (ref 7–25)
CO2: 27 mmol/L (ref 20–31)
Calcium: 9.5 mg/dL (ref 8.6–10.4)
Chloride: 89 mmol/L — ABNORMAL LOW (ref 98–110)
Creat: 0.86 mg/dL (ref 0.60–0.88)
Glucose, Bld: 119 mg/dL — ABNORMAL HIGH (ref 65–99)
Potassium: 4 mmol/L (ref 3.5–5.3)
Sodium: 128 mmol/L — ABNORMAL LOW (ref 135–146)

## 2015-04-12 NOTE — Addendum Note (Signed)
Addended by: Eulis Foster on: 04/12/2015 03:55 PM   Modules accepted: Orders

## 2015-04-12 NOTE — Addendum Note (Signed)
Addended by: Eulis Foster on: 04/12/2015 03:56 PM   Modules accepted: Orders

## 2015-04-13 DIAGNOSIS — Z780 Asymptomatic menopausal state: Secondary | ICD-10-CM | POA: Diagnosis not present

## 2015-04-13 DIAGNOSIS — I2510 Atherosclerotic heart disease of native coronary artery without angina pectoris: Secondary | ICD-10-CM | POA: Diagnosis not present

## 2015-04-13 DIAGNOSIS — E782 Mixed hyperlipidemia: Secondary | ICD-10-CM | POA: Diagnosis not present

## 2015-04-13 DIAGNOSIS — I10 Essential (primary) hypertension: Secondary | ICD-10-CM | POA: Diagnosis not present

## 2015-04-13 DIAGNOSIS — Z23 Encounter for immunization: Secondary | ICD-10-CM | POA: Diagnosis not present

## 2015-04-13 DIAGNOSIS — E109 Type 1 diabetes mellitus without complications: Secondary | ICD-10-CM | POA: Diagnosis not present

## 2015-04-14 ENCOUNTER — Telehealth: Payer: Self-pay | Admitting: Internal Medicine

## 2015-04-14 NOTE — Telephone Encounter (Signed)
New problem    Pt want to know results of labs she had done 10.17.16.

## 2015-04-14 NOTE — Telephone Encounter (Signed)
Attempted to call pt and phone rang several times with no answer and no voicemail. Unable to leave message.

## 2015-04-15 ENCOUNTER — Other Ambulatory Visit: Payer: Self-pay | Admitting: *Deleted

## 2015-04-15 DIAGNOSIS — E871 Hypo-osmolality and hyponatremia: Secondary | ICD-10-CM

## 2015-04-28 ENCOUNTER — Telehealth: Payer: Self-pay | Admitting: Gastroenterology

## 2015-04-28 NOTE — Telephone Encounter (Signed)
Patient states for the last two weeks, she has been leaking stool. Also has noticed bright, red blood on tissue when wipes. She is not having constipation and has now had for some time now. Scheduled with Nicoletta Ba, PA on 05/05/15.

## 2015-05-01 ENCOUNTER — Encounter (HOSPITAL_COMMUNITY): Payer: Self-pay | Admitting: Emergency Medicine

## 2015-05-01 ENCOUNTER — Inpatient Hospital Stay (HOSPITAL_COMMUNITY)
Admission: EM | Admit: 2015-05-01 | Discharge: 2015-05-03 | DRG: 392 | Disposition: A | Discharge status: Home / Self Care | Payer: Medicare Other | Attending: Family Medicine | Admitting: Family Medicine

## 2015-05-01 ENCOUNTER — Emergency Department (HOSPITAL_COMMUNITY): Payer: Medicare Other

## 2015-05-01 DIAGNOSIS — Z833 Family history of diabetes mellitus: Secondary | ICD-10-CM

## 2015-05-01 DIAGNOSIS — Z90710 Acquired absence of both cervix and uterus: Secondary | ICD-10-CM

## 2015-05-01 DIAGNOSIS — I2510 Atherosclerotic heart disease of native coronary artery without angina pectoris: Secondary | ICD-10-CM | POA: Diagnosis present

## 2015-05-01 DIAGNOSIS — Z8541 Personal history of malignant neoplasm of cervix uteri: Secondary | ICD-10-CM

## 2015-05-01 DIAGNOSIS — K219 Gastro-esophageal reflux disease without esophagitis: Secondary | ICD-10-CM | POA: Diagnosis present

## 2015-05-01 DIAGNOSIS — Z7952 Long term (current) use of systemic steroids: Secondary | ICD-10-CM

## 2015-05-01 DIAGNOSIS — E860 Dehydration: Secondary | ICD-10-CM | POA: Diagnosis present

## 2015-05-01 DIAGNOSIS — Z981 Arthrodesis status: Secondary | ICD-10-CM

## 2015-05-01 DIAGNOSIS — R197 Diarrhea, unspecified: Secondary | ICD-10-CM | POA: Diagnosis not present

## 2015-05-01 DIAGNOSIS — I241 Dressler's syndrome: Secondary | ICD-10-CM | POA: Diagnosis not present

## 2015-05-01 DIAGNOSIS — M797 Fibromyalgia: Secondary | ICD-10-CM | POA: Diagnosis present

## 2015-05-01 DIAGNOSIS — Z9109 Other allergy status, other than to drugs and biological substances: Secondary | ICD-10-CM

## 2015-05-01 DIAGNOSIS — G4730 Sleep apnea, unspecified: Secondary | ICD-10-CM | POA: Diagnosis present

## 2015-05-01 DIAGNOSIS — M1990 Unspecified osteoarthritis, unspecified site: Secondary | ICD-10-CM | POA: Diagnosis present

## 2015-05-01 DIAGNOSIS — R110 Nausea: Secondary | ICD-10-CM | POA: Diagnosis not present

## 2015-05-01 DIAGNOSIS — E119 Type 2 diabetes mellitus without complications: Secondary | ICD-10-CM | POA: Diagnosis present

## 2015-05-01 DIAGNOSIS — H548 Legal blindness, as defined in USA: Secondary | ICD-10-CM | POA: Diagnosis present

## 2015-05-01 DIAGNOSIS — Z7982 Long term (current) use of aspirin: Secondary | ICD-10-CM

## 2015-05-01 DIAGNOSIS — J329 Chronic sinusitis, unspecified: Secondary | ICD-10-CM | POA: Diagnosis present

## 2015-05-01 DIAGNOSIS — Z881 Allergy status to other antibiotic agents status: Secondary | ICD-10-CM

## 2015-05-01 DIAGNOSIS — K2970 Gastritis, unspecified, without bleeding: Secondary | ICD-10-CM | POA: Diagnosis not present

## 2015-05-01 DIAGNOSIS — I272 Other secondary pulmonary hypertension: Secondary | ICD-10-CM | POA: Diagnosis present

## 2015-05-01 DIAGNOSIS — K225 Diverticulum of esophagus, acquired: Secondary | ICD-10-CM | POA: Diagnosis present

## 2015-05-01 DIAGNOSIS — F329 Major depressive disorder, single episode, unspecified: Secondary | ICD-10-CM | POA: Diagnosis present

## 2015-05-01 DIAGNOSIS — J449 Chronic obstructive pulmonary disease, unspecified: Secondary | ICD-10-CM | POA: Diagnosis not present

## 2015-05-01 DIAGNOSIS — Z801 Family history of malignant neoplasm of trachea, bronchus and lung: Secondary | ICD-10-CM

## 2015-05-01 DIAGNOSIS — M349 Systemic sclerosis, unspecified: Secondary | ICD-10-CM | POA: Diagnosis present

## 2015-05-01 DIAGNOSIS — Z8249 Family history of ischemic heart disease and other diseases of the circulatory system: Secondary | ICD-10-CM

## 2015-05-01 DIAGNOSIS — Z8701 Personal history of pneumonia (recurrent): Secondary | ICD-10-CM

## 2015-05-01 DIAGNOSIS — I7300 Raynaud's syndrome without gangrene: Secondary | ICD-10-CM | POA: Diagnosis present

## 2015-05-01 DIAGNOSIS — Z87891 Personal history of nicotine dependence: Secondary | ICD-10-CM

## 2015-05-01 DIAGNOSIS — E039 Hypothyroidism, unspecified: Secondary | ICD-10-CM | POA: Diagnosis present

## 2015-05-01 DIAGNOSIS — E785 Hyperlipidemia, unspecified: Secondary | ICD-10-CM | POA: Diagnosis present

## 2015-05-01 DIAGNOSIS — I252 Old myocardial infarction: Secondary | ICD-10-CM

## 2015-05-01 DIAGNOSIS — H3530 Unspecified macular degeneration: Secondary | ICD-10-CM | POA: Diagnosis present

## 2015-05-01 DIAGNOSIS — E871 Hypo-osmolality and hyponatremia: Secondary | ICD-10-CM | POA: Diagnosis not present

## 2015-05-01 DIAGNOSIS — M3500 Sicca syndrome, unspecified: Secondary | ICD-10-CM | POA: Diagnosis present

## 2015-05-01 DIAGNOSIS — F32A Depression, unspecified: Secondary | ICD-10-CM | POA: Diagnosis present

## 2015-05-01 DIAGNOSIS — Z9049 Acquired absence of other specified parts of digestive tract: Secondary | ICD-10-CM

## 2015-05-01 DIAGNOSIS — K589 Irritable bowel syndrome without diarrhea: Secondary | ICD-10-CM | POA: Diagnosis present

## 2015-05-01 DIAGNOSIS — Z823 Family history of stroke: Secondary | ICD-10-CM

## 2015-05-01 DIAGNOSIS — Z888 Allergy status to other drugs, medicaments and biological substances status: Secondary | ICD-10-CM

## 2015-05-01 DIAGNOSIS — Z8673 Personal history of transient ischemic attack (TIA), and cerebral infarction without residual deficits: Secondary | ICD-10-CM

## 2015-05-01 DIAGNOSIS — Z794 Long term (current) use of insulin: Secondary | ICD-10-CM

## 2015-05-01 DIAGNOSIS — R1030 Lower abdominal pain, unspecified: Secondary | ICD-10-CM | POA: Diagnosis not present

## 2015-05-01 DIAGNOSIS — K921 Melena: Secondary | ICD-10-CM | POA: Diagnosis not present

## 2015-05-01 DIAGNOSIS — I10 Essential (primary) hypertension: Secondary | ICD-10-CM | POA: Diagnosis present

## 2015-05-01 DIAGNOSIS — Z79899 Other long term (current) drug therapy: Secondary | ICD-10-CM

## 2015-05-01 DIAGNOSIS — Z882 Allergy status to sulfonamides status: Secondary | ICD-10-CM

## 2015-05-01 DIAGNOSIS — K5730 Diverticulosis of large intestine without perforation or abscess without bleeding: Secondary | ICD-10-CM | POA: Diagnosis present

## 2015-05-01 DIAGNOSIS — R630 Anorexia: Secondary | ICD-10-CM | POA: Diagnosis present

## 2015-05-01 LAB — CBC
HCT: 40.7 % (ref 36.0–46.0)
Hemoglobin: 13.9 g/dL (ref 12.0–15.0)
MCH: 28.1 pg (ref 26.0–34.0)
MCHC: 34.2 g/dL (ref 30.0–36.0)
MCV: 82.4 fL (ref 78.0–100.0)
Platelets: 223 10*3/uL (ref 150–400)
RBC: 4.94 MIL/uL (ref 3.87–5.11)
RDW: 14.3 % (ref 11.5–15.5)
WBC: 13.8 10*3/uL — ABNORMAL HIGH (ref 4.0–10.5)

## 2015-05-01 LAB — COMPREHENSIVE METABOLIC PANEL
ALT: 24 U/L (ref 14–54)
AST: 23 U/L (ref 15–41)
Albumin: 3.9 g/dL (ref 3.5–5.0)
Alkaline Phosphatase: 52 U/L (ref 38–126)
Anion gap: 10 (ref 5–15)
BUN: 23 mg/dL — ABNORMAL HIGH (ref 6–20)
CO2: 25 mmol/L (ref 22–32)
Calcium: 8.7 mg/dL — ABNORMAL LOW (ref 8.9–10.3)
Chloride: 91 mmol/L — ABNORMAL LOW (ref 101–111)
Creatinine, Ser: 0.71 mg/dL (ref 0.44–1.00)
GFR calc Af Amer: >60 mL/min (ref >60)
GFR calc non Af Amer: >60 mL/min (ref >60)
Glucose, Bld: 198 mg/dL — ABNORMAL HIGH (ref 65–99)
Potassium: 4 mmol/L (ref 3.5–5.1)
Sodium: 126 mmol/L — ABNORMAL LOW (ref 135–145)
Total Bilirubin: 0.5 mg/dL (ref 0.3–1.2)
Total Protein: 6.8 g/dL (ref 6.5–8.1)

## 2015-05-01 LAB — URINALYSIS, ROUTINE W REFLEX MICROSCOPIC
Bilirubin Urine: NEGATIVE
Glucose, UA: NEGATIVE mg/dL
Hgb urine dipstick: NEGATIVE
Ketones, ur: NEGATIVE mg/dL
Leukocytes, UA: NEGATIVE
Nitrite: NEGATIVE
Protein, ur: NEGATIVE mg/dL
Specific Gravity, Urine: 1.008 (ref 1.005–1.030)
Urobilinogen, UA: 0.2 mg/dL (ref 0.0–1.0)
pH: 6 (ref 5.0–8.0)

## 2015-05-01 LAB — LIPASE, BLOOD: Lipase: 54 U/L — ABNORMAL HIGH (ref 11–51)

## 2015-05-01 MED ORDER — ONDANSETRON HCL 4 MG/2ML IJ SOLN
4.0000 mg | Freq: Once | INTRAMUSCULAR | Status: AC
  Administered 2015-05-01: 4 mg via INTRAVENOUS
  Filled 2015-05-01: qty 2

## 2015-05-01 MED ORDER — SODIUM CHLORIDE 0.9 % IV BOLUS (SEPSIS)
1000.0000 mL | Freq: Once | INTRAVENOUS | Status: AC
  Administered 2015-05-01: 1000 mL via INTRAVENOUS

## 2015-05-01 MED ORDER — IOHEXOL 300 MG/ML  SOLN
100.0000 mL | Freq: Once | INTRAMUSCULAR | Status: AC | PRN
  Administered 2015-05-01: 100 mL via INTRAVENOUS

## 2015-05-01 NOTE — ED Notes (Signed)
Bed: WA06 Expected date:  Expected time:  Means of arrival:  Comments: Diarrhea, 77 yr

## 2015-05-01 NOTE — ED Notes (Signed)
Pt presents from home via EMS c/o diarrhea x 2 weeks with occasional bright red appearance and increased gas.  Pt is legally blind and therefore is unsure of whether or not she saw blood in her stool.  Hx of insulin-dependent diabetes II and diverticulitis.  EMS states that pt abdomen was tender to palpation in lower quadrants and that she was guarding during exam.  Pt is alert and oriented x 4.

## 2015-05-01 NOTE — ED Notes (Signed)
Delay in c difficile scan pt says she doesn't have to go

## 2015-05-02 ENCOUNTER — Encounter (HOSPITAL_COMMUNITY): Payer: Self-pay | Admitting: Internal Medicine

## 2015-05-02 DIAGNOSIS — Z823 Family history of stroke: Secondary | ICD-10-CM | POA: Diagnosis not present

## 2015-05-02 DIAGNOSIS — F329 Major depressive disorder, single episode, unspecified: Secondary | ICD-10-CM | POA: Diagnosis present

## 2015-05-02 DIAGNOSIS — Z90710 Acquired absence of both cervix and uterus: Secondary | ICD-10-CM | POA: Diagnosis not present

## 2015-05-02 DIAGNOSIS — Z7982 Long term (current) use of aspirin: Secondary | ICD-10-CM | POA: Diagnosis not present

## 2015-05-02 DIAGNOSIS — H3530 Unspecified macular degeneration: Secondary | ICD-10-CM | POA: Diagnosis present

## 2015-05-02 DIAGNOSIS — M797 Fibromyalgia: Secondary | ICD-10-CM | POA: Diagnosis present

## 2015-05-02 DIAGNOSIS — E119 Type 2 diabetes mellitus without complications: Secondary | ICD-10-CM | POA: Diagnosis not present

## 2015-05-02 DIAGNOSIS — Z981 Arthrodesis status: Secondary | ICD-10-CM | POA: Diagnosis not present

## 2015-05-02 DIAGNOSIS — J449 Chronic obstructive pulmonary disease, unspecified: Secondary | ICD-10-CM | POA: Diagnosis present

## 2015-05-02 DIAGNOSIS — Z79899 Other long term (current) drug therapy: Secondary | ICD-10-CM | POA: Diagnosis not present

## 2015-05-02 DIAGNOSIS — E785 Hyperlipidemia, unspecified: Secondary | ICD-10-CM | POA: Diagnosis not present

## 2015-05-02 DIAGNOSIS — Z8249 Family history of ischemic heart disease and other diseases of the circulatory system: Secondary | ICD-10-CM | POA: Diagnosis not present

## 2015-05-02 DIAGNOSIS — E039 Hypothyroidism, unspecified: Secondary | ICD-10-CM | POA: Diagnosis present

## 2015-05-02 DIAGNOSIS — K225 Diverticulum of esophagus, acquired: Secondary | ICD-10-CM | POA: Diagnosis present

## 2015-05-02 DIAGNOSIS — I7300 Raynaud's syndrome without gangrene: Secondary | ICD-10-CM | POA: Diagnosis present

## 2015-05-02 DIAGNOSIS — E871 Hypo-osmolality and hyponatremia: Secondary | ICD-10-CM | POA: Diagnosis present

## 2015-05-02 DIAGNOSIS — K5730 Diverticulosis of large intestine without perforation or abscess without bleeding: Secondary | ICD-10-CM | POA: Diagnosis present

## 2015-05-02 DIAGNOSIS — K589 Irritable bowel syndrome without diarrhea: Secondary | ICD-10-CM | POA: Diagnosis present

## 2015-05-02 DIAGNOSIS — J329 Chronic sinusitis, unspecified: Secondary | ICD-10-CM | POA: Diagnosis present

## 2015-05-02 DIAGNOSIS — H548 Legal blindness, as defined in USA: Secondary | ICD-10-CM | POA: Diagnosis present

## 2015-05-02 DIAGNOSIS — I241 Dressler's syndrome: Secondary | ICD-10-CM | POA: Diagnosis present

## 2015-05-02 DIAGNOSIS — I10 Essential (primary) hypertension: Secondary | ICD-10-CM | POA: Diagnosis not present

## 2015-05-02 DIAGNOSIS — I2510 Atherosclerotic heart disease of native coronary artery without angina pectoris: Secondary | ICD-10-CM | POA: Diagnosis present

## 2015-05-02 DIAGNOSIS — I252 Old myocardial infarction: Secondary | ICD-10-CM | POA: Diagnosis not present

## 2015-05-02 DIAGNOSIS — R630 Anorexia: Secondary | ICD-10-CM | POA: Diagnosis present

## 2015-05-02 DIAGNOSIS — Z8541 Personal history of malignant neoplasm of cervix uteri: Secondary | ICD-10-CM | POA: Diagnosis not present

## 2015-05-02 DIAGNOSIS — M349 Systemic sclerosis, unspecified: Secondary | ICD-10-CM | POA: Diagnosis present

## 2015-05-02 DIAGNOSIS — G4730 Sleep apnea, unspecified: Secondary | ICD-10-CM | POA: Diagnosis present

## 2015-05-02 DIAGNOSIS — Z7952 Long term (current) use of systemic steroids: Secondary | ICD-10-CM | POA: Diagnosis not present

## 2015-05-02 DIAGNOSIS — Z881 Allergy status to other antibiotic agents status: Secondary | ICD-10-CM | POA: Diagnosis not present

## 2015-05-02 DIAGNOSIS — Z833 Family history of diabetes mellitus: Secondary | ICD-10-CM | POA: Diagnosis not present

## 2015-05-02 DIAGNOSIS — Z87891 Personal history of nicotine dependence: Secondary | ICD-10-CM | POA: Diagnosis not present

## 2015-05-02 DIAGNOSIS — Z882 Allergy status to sulfonamides status: Secondary | ICD-10-CM | POA: Diagnosis not present

## 2015-05-02 DIAGNOSIS — E860 Dehydration: Secondary | ICD-10-CM | POA: Diagnosis present

## 2015-05-02 DIAGNOSIS — Z9049 Acquired absence of other specified parts of digestive tract: Secondary | ICD-10-CM | POA: Diagnosis not present

## 2015-05-02 DIAGNOSIS — Z794 Long term (current) use of insulin: Secondary | ICD-10-CM | POA: Diagnosis not present

## 2015-05-02 DIAGNOSIS — Z9109 Other allergy status, other than to drugs and biological substances: Secondary | ICD-10-CM | POA: Diagnosis not present

## 2015-05-02 DIAGNOSIS — Z8701 Personal history of pneumonia (recurrent): Secondary | ICD-10-CM | POA: Diagnosis not present

## 2015-05-02 DIAGNOSIS — R197 Diarrhea, unspecified: Secondary | ICD-10-CM | POA: Diagnosis not present

## 2015-05-02 DIAGNOSIS — M1990 Unspecified osteoarthritis, unspecified site: Secondary | ICD-10-CM | POA: Diagnosis present

## 2015-05-02 DIAGNOSIS — Z801 Family history of malignant neoplasm of trachea, bronchus and lung: Secondary | ICD-10-CM | POA: Diagnosis not present

## 2015-05-02 DIAGNOSIS — Z888 Allergy status to other drugs, medicaments and biological substances status: Secondary | ICD-10-CM | POA: Diagnosis not present

## 2015-05-02 DIAGNOSIS — K219 Gastro-esophageal reflux disease without esophagitis: Secondary | ICD-10-CM | POA: Diagnosis present

## 2015-05-02 DIAGNOSIS — M3500 Sicca syndrome, unspecified: Secondary | ICD-10-CM | POA: Diagnosis present

## 2015-05-02 DIAGNOSIS — I272 Other secondary pulmonary hypertension: Secondary | ICD-10-CM | POA: Diagnosis present

## 2015-05-02 DIAGNOSIS — Z8673 Personal history of transient ischemic attack (TIA), and cerebral infarction without residual deficits: Secondary | ICD-10-CM | POA: Diagnosis not present

## 2015-05-02 LAB — MAGNESIUM: Magnesium: 1.7 mg/dL (ref 1.7–2.4)

## 2015-05-02 LAB — CBC
HCT: 39.2 % (ref 36.0–46.0)
Hemoglobin: 13.2 g/dL (ref 12.0–15.0)
MCH: 28.1 pg (ref 26.0–34.0)
MCHC: 33.7 g/dL (ref 30.0–36.0)
MCV: 83.6 fL (ref 78.0–100.0)
Platelets: 166 10*3/uL (ref 150–400)
RBC: 4.69 MIL/uL (ref 3.87–5.11)
RDW: 14.6 % (ref 11.5–15.5)
WBC: 10.2 10*3/uL (ref 4.0–10.5)

## 2015-05-02 LAB — GLUCOSE, CAPILLARY
Glucose-Capillary: 180 mg/dL — ABNORMAL HIGH (ref 65–99)
Glucose-Capillary: 189 mg/dL — ABNORMAL HIGH (ref 65–99)

## 2015-05-02 LAB — COMPREHENSIVE METABOLIC PANEL
ALT: 21 U/L (ref 14–54)
AST: 20 U/L (ref 15–41)
Albumin: 3.3 g/dL — ABNORMAL LOW (ref 3.5–5.0)
Alkaline Phosphatase: 47 U/L (ref 38–126)
Anion gap: 8 (ref 5–15)
BUN: 19 mg/dL (ref 6–20)
CO2: 24 mmol/L (ref 22–32)
Calcium: 7.9 mg/dL — ABNORMAL LOW (ref 8.9–10.3)
Chloride: 97 mmol/L — ABNORMAL LOW (ref 101–111)
Creatinine, Ser: 0.69 mg/dL (ref 0.44–1.00)
GFR calc Af Amer: >60 mL/min (ref >60)
GFR calc non Af Amer: >60 mL/min (ref >60)
Glucose, Bld: 216 mg/dL — ABNORMAL HIGH (ref 65–99)
Potassium: 3.8 mmol/L (ref 3.5–5.1)
Sodium: 129 mmol/L — ABNORMAL LOW (ref 135–145)
Total Bilirubin: 0.5 mg/dL (ref 0.3–1.2)
Total Protein: 5.8 g/dL — ABNORMAL LOW (ref 6.5–8.1)

## 2015-05-02 LAB — PHOSPHORUS: Phosphorus: 2.9 mg/dL (ref 2.5–4.6)

## 2015-05-02 MED ORDER — FLUTICASONE PROPIONATE 50 MCG/ACT NA SUSP
1.0000 | Freq: Every day | NASAL | Status: DC
  Administered 2015-05-02 – 2015-05-03 (×2): 1 via NASAL
  Filled 2015-05-02: qty 16

## 2015-05-02 MED ORDER — IMIPRAMINE HCL 50 MG PO TABS
75.0000 mg | ORAL_TABLET | Freq: Every day | ORAL | Status: DC
  Administered 2015-05-02 (×2): 75 mg via ORAL
  Filled 2015-05-02 (×3): qty 1

## 2015-05-02 MED ORDER — TRIAZOLAM 0.125 MG PO TABS
0.1250 mg | ORAL_TABLET | Freq: Every evening | ORAL | Status: DC | PRN
  Administered 2015-05-02 (×2): 0.25 mg via ORAL
  Filled 2015-05-02: qty 2
  Filled 2015-05-02 (×2): qty 1

## 2015-05-02 MED ORDER — PANTOPRAZOLE SODIUM 40 MG PO TBEC
40.0000 mg | DELAYED_RELEASE_TABLET | Freq: Every day | ORAL | Status: DC
Start: 2015-05-02 — End: 2015-05-03
  Administered 2015-05-02 – 2015-05-03 (×2): 40 mg via ORAL
  Filled 2015-05-02 (×2): qty 1

## 2015-05-02 MED ORDER — SODIUM CHLORIDE 0.9 % IJ SOLN
3.0000 mL | Freq: Two times a day (BID) | INTRAMUSCULAR | Status: DC
  Administered 2015-05-02: 3 mL via INTRAVENOUS

## 2015-05-02 MED ORDER — RISAQUAD PO CAPS
1.0000 | ORAL_CAPSULE | Freq: Every day | ORAL | Status: DC
  Administered 2015-05-02 – 2015-05-03 (×2): 1 via ORAL
  Filled 2015-05-02 (×2): qty 1

## 2015-05-02 MED ORDER — LEVOTHYROXINE SODIUM 100 MCG PO TABS
100.0000 ug | ORAL_TABLET | Freq: Every day | ORAL | Status: DC
  Administered 2015-05-02 – 2015-05-03 (×2): 100 ug via ORAL
  Filled 2015-05-02 (×2): qty 1

## 2015-05-02 MED ORDER — LORATADINE 10 MG PO TABS
10.0000 mg | ORAL_TABLET | Freq: Every day | ORAL | Status: DC
  Administered 2015-05-02 – 2015-05-03 (×2): 10 mg via ORAL
  Filled 2015-05-02 (×2): qty 1

## 2015-05-02 MED ORDER — METRONIDAZOLE IN NACL 5-0.79 MG/ML-% IV SOLN
500.0000 mg | Freq: Three times a day (TID) | INTRAVENOUS | Status: DC
  Administered 2015-05-02 – 2015-05-03 (×5): 500 mg via INTRAVENOUS
  Filled 2015-05-02 (×4): qty 100

## 2015-05-02 MED ORDER — VITAMINS A & D EX OINT
TOPICAL_OINTMENT | CUTANEOUS | Status: AC
  Administered 2015-05-02: 1
  Filled 2015-05-02: qty 5

## 2015-05-02 MED ORDER — PREDNISONE 5 MG PO TABS
5.0000 mg | ORAL_TABLET | Freq: Every day | ORAL | Status: DC
  Administered 2015-05-02 – 2015-05-03 (×2): 5 mg via ORAL
  Filled 2015-05-02 (×2): qty 1

## 2015-05-02 MED ORDER — INSULIN LISPRO 100 UNIT/ML ~~LOC~~ SOLN
4.0000 [IU] | Freq: Every day | SUBCUTANEOUS | Status: DC
  Administered 2015-05-02: 4 [IU] via SUBCUTANEOUS

## 2015-05-02 MED ORDER — INSULIN LISPRO 100 UNIT/ML ~~LOC~~ SOLN
6.0000 [IU] | Freq: Two times a day (BID) | SUBCUTANEOUS | Status: DC
  Administered 2015-05-02 – 2015-05-03 (×3): 6 [IU] via SUBCUTANEOUS
  Filled 2015-05-02: qty 10

## 2015-05-02 MED ORDER — POTASSIUM CHLORIDE IN NACL 20-0.9 MEQ/L-% IV SOLN
INTRAVENOUS | Status: DC
  Administered 2015-05-02 (×2): via INTRAVENOUS
  Filled 2015-05-02 (×4): qty 1000

## 2015-05-02 MED ORDER — METRONIDAZOLE IN NACL 5-0.79 MG/ML-% IV SOLN
INTRAVENOUS | Status: AC
  Filled 2015-05-02: qty 100

## 2015-05-02 MED ORDER — ASPIRIN EC 81 MG PO TBEC
81.0000 mg | DELAYED_RELEASE_TABLET | Freq: Every day | ORAL | Status: DC
  Administered 2015-05-02 – 2015-05-03 (×2): 81 mg via ORAL
  Filled 2015-05-02 (×2): qty 1

## 2015-05-02 MED ORDER — SODIUM CHLORIDE 0.9 % IV SOLN
INTRAVENOUS | Status: AC
  Administered 2015-05-02: 02:00:00 via INTRAVENOUS

## 2015-05-02 MED ORDER — INSULIN LISPRO 100 UNIT/ML ~~LOC~~ SOLN: 4.0000 [IU] | Freq: Three times a day (TID) | SUBCUTANEOUS | Status: DC

## 2015-05-02 NOTE — H&P (Signed)
[facility] History and Physical  [name]:[id number] DOB: 24-Nov-1930 DOA: 05/01/2015  Referring physician: Nat Christen, MD PCP: Merrilee Seashore, MD   Chief Complaint: Diarrhea for 2 weeks.  HPI: [name] is a 81 y.o. female with a past medical history of diverticulosis, IBS, hypothyroidism, COPD, CAD, Dressler syndrome, Sjogren's syndrome, hypertension, hyperlipidemia, type 2 diabetes, CVA, macular degeneration, GERD who comes to the emergency department with complaints of having 2 weeks of multiple episodes of diarrhea daily, left lower quadrant abdominal pain, decreased appetite, mild nausea, fatigue and chills, but no fever or emesis. These symptoms started 3 days after the patient finished a ten-day course of amoxicillin for sinusitis. She denies travel history or sick contacts.   Workup in the ER reveals hyponatremia, mild leukocytosis and a CT scan with diverticulosis, but no diverticulitis or any other acute findings.   Review of Systems:  Constitutional:  Positive for chills, fatigue No weight loss, night sweats, Fevers.  HEENT:  No headaches, Difficulty swallowing,Tooth/dental problems,Sore throat,  No sneezing, itching, ear ache, nasal congestion, post nasal drip,  Cardio-vascular:  No chest pain, Orthopnea, PND, swelling in lower extremities, anasarca, dizziness, palpitations  GI:  Positive for abdominal pain, nausea, loss of appetite  No heartburn, indigestion,  vomiting, diarrhea, change in bowel habits, Resp:  No shortness of breath with exertion or at rest. No excess mucus, no productive cough, No non-productive cough, No coughing up of blood.No change in color of mucus.No wheezing.No chest wall deformity  Skin:  no rash or lesions.  GU:  no dysuria, change in color of urine, no urgency or frequency. No flank pain.  Musculoskeletal:  Occasional arthralgias. No decreased range of motion. No back pain.  Psych:  No change in mood or  affect. No depression or anxiety. No memory loss.   Past Medical History  Diagnosis Date  . Coronary artery disease   . 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[place])   . Hypertension   . Dyslipidemia   . Diabetes mellitus   . CVA (cerebral infarction) 1991  . Macular degeneration   . GERD (gastroesophageal reflux disease)   . Diverticulosis   . IBS (irritable bowel syndrome)   . Zenker's diverticulum   . Emphysema   . Pulmonary hypertension ([place])   . Chronic sinusitis   . Fibromyalgia   . Hypothyroidism   . Sjogren's disease ([place])   . Raynaud's phenomenon   . Cervical disc disease   . Adenomatous colon polyp   . Scleroderma ([place])   . COPD (chronic obstructive pulmonary disease) ([place])   . Arthritis   . Blood transfusion   . Myocardial infarction [facility]) may 27th 2007  . Depression   . Stroke [facility]) 1990    brain stem stroke - weakness rt hand  . Cancer [facility])     cervical cancer pre hysterectomy  . Sleep apnea     STOP BANG SCORE 5  . Legally blind   . CONSTIPATION, CHRONIC 06/26/2007    Qualifier: Diagnosis of  By: Nils Pyle CMA ([place]), Mearl Latin    . Pneumonia 07/16/2013     HX PNEUMONIA  . Pneumonia   . Bronchitis    Past Surgical History  Procedure Laterality Date  . Total abdominal hysterectomy  1973  . Bilateral oophorectomy  2002  . Spinal fusion  1997    and implantation of a plate   . Lobe thyroid removal    . Thyroid lobectomy  1991    removal of left lobe thyroid  .  Cholecystectomy  1965  . Appendectomy    . Cataract extraction    . Bronchoscopy  02-22-09  . Stented cardiac  artery  2007 / 2007 / 2010    X 3 STENT  . Proctoscopy  03/18/2012    Procedure: PROCTOSCOPY;  Surgeon: Adin Hector, MD;  Location: [facility];  Service: General;  Laterality: N/A;  rigid procto  . Colon surgery  2014    colon resection  . Esophagogastroduodenoscopy (egd) with esophageal dilation N/A 06/18/2013    Procedure: ESOPHAGOGASTRODUODENOSCOPY (EGD) WITH ESOPHAGEAL DILATION;  Surgeon: Inda Castle, MD;  Location: [place];  Service: Endoscopy;  Laterality: N/A;  . Cardiac catheterization      x 3  . Colonectomy    . Radiology with anesthesia N/A 03/09/2015    Procedure: MRI CERVICAL SPINE WITHOUT AND LUMBER WITHOUT;  Surgeon: Medication Radiologist, MD;  Location: [place];  Service: Radiology;  Laterality: N/A;   Social History:  reports that she quit smoking about 26 years ago. Her smoking use included Cigarettes. She has a 40 pack-year smoking history. She has never used smokeless tobacco. She reports that she does not drink alcohol or use illicit drugs.  Allergies  Allergen Reactions  . Lantus [Insulin Glargine]     Patient states "sulfate binder causes sinus infection/pneumonia".  [place] [Levofloxacin In D5w] Other (See Comments)    Muscle aches  . Maxidex [Dexamethasone]     Insomnia, anxiety, dizziness  . Metoprolol-Hydrochlorothiazide     REACTION: decreased bp  . Norvasc [Amlodipine]     BIL Ankle edema  . Novolog [Insulin Aspart]     Patient states "sulfate binder causes sinus infection/pneumonia". Tolerates Humalog.  . Sulfonamide Derivatives Hives  . Titanium     Cervical plate - had to change for stainless steel  . Adhesive [Tape] Hives and Rash    Family History  Problem Relation Age of Onset  . Other Mother 57    Died from sepsis  . Stroke Father 74    died  . Heart disease Father   . Diabetes Father   . Cancer Sister     lung  . Diabetes Sister   . Cancer Brother     lung  . Diabetes Brother   . Diabetes Brother      Prior to Admission medications   Medication Sig Start Date End Date Taking? Authorizing Provider  acidophilus (RISAQUAD) CAPS capsule Take 1 capsule by mouth daily.   Yes Historical Provider, MD  aspirin EC 81 MG tablet Take 1 tablet (81 mg total) by mouth daily. 11/18/13  Yes Fay Records, MD  cetirizine (ZYRTEC) 10 MG tablet Take 10 mg by mouth every evening.   Yes Historical Provider, MD  esomeprazole (NEXIUM) 40 MG  capsule Take 1 capsule (40 mg total) by mouth daily before breakfast. 01/14/13  Yes Fay Records, MD  imipramine (TOFRANIL) 25 MG tablet Take 75 mg by mouth at bedtime.  03/22/11  Yes Historical Provider, MD  insulin lispro (HUMALOG) 100 UNIT/ML injection Inject 4-6 Units into the skin 3 (three) times daily. Take 6 units in AM, 6 units at noon and 4 units at bedtime.   Yes Historical Provider, MD  levothyroxine (SYNTHROID, LEVOTHROID) 100 MCG tablet Take 100 mcg by mouth daily before breakfast.   Yes Historical Provider, MD  mometasone (NASONEX) 50 MCG/ACT nasal spray Place 2 sprays into the nose daily as needed (for nasal congestion).    Yes Historical Provider, MD  OVER THE COUNTER MEDICATION Place 1-2 drops into both eyes 2 (two) times daily as needed (For dry irritated eyes.). Allergy Eye Drops   Yes Historical Provider, MD  predniSONE (DELTASONE) 5 MG tablet Take 5 mg by mouth daily with breakfast.   Yes Historical Provider, MD  triamterene-hydrochlorothiazide (MAXZIDE-25) 37.5-25 MG per tablet Take 1/2 tablet daily Patient taking differently: Take 0.5 tablets by mouth daily.  03/03/15  Yes Fay Records, MD  triazolam (HALCION) 0.25 MG tablet Take 0.125-0.25 mg by mouth at bedtime as needed for sleep. For sleep   Yes Historical Provider, MD   Physical Exam: Filed Vitals:   05/01/15 1956 05/01/15 2217 05/02/15 0032 05/02/15 0150  BP: 138/47 136/49 134/52 166/81  Pulse: 71 74 78 66  Temp: 98 F (36.7 C)   98.3 F (36.8 C)  TempSrc: Oral   Oral  Resp: 16 16 16 16   Height: 5\' 2"  (1.575 m)   5\' 2"  (1.575 m)  Weight: 60.782 kg (134 lb)   62.007 kg (136 lb 11.2 oz)  SpO2: 96% 97% 98% 99%    Wt Readings from Last 3 Encounters:  05/02/15 62.007 kg (136 lb 11.2 oz)  04/02/15 62.197 kg (137 lb 1.9 oz)  03/18/15 63.05 kg (139 lb)    General:  Appears calm and comfortable Eyes: PERRL, normal lids, irises & conjunctiva ENT: grossly normal hearing, lips & oral mucosa are dry. Neck: no LAD,  masses or thyromegaly Cardiovascular: RRR, no m/r/g. No LE edema. Telemetry: SR, no arrhythmias  Respiratory: CTA bilaterally, no w/r/r. Normal respiratory effort. Abdomen: Bowel sounds hyperactive, soft, positive left lower quadrant tenderness without guarding or rebound tenderness. Skin: no rash or induration seen on limited exam Musculoskeletal: grossly normal tone BUE/BLE Psychiatric: grossly normal mood and affect, speech fluent and appropriate Neurologic: grossly non-focal.          Labs on Admission:  Basic Metabolic Panel:  Recent Labs Lab 05/01/15 2013  NA 126*  K 4.0  CL 91*  CO2 25  GLUCOSE 198*  BUN 23*  CREATININE 0.71  CALCIUM 8.7*   Liver Function Tests:  Recent Labs Lab 05/01/15 2013  AST 23  ALT 24  ALKPHOS 52  BILITOT 0.5  PROT 6.8  ALBUMIN 3.9    Recent Labs Lab 05/01/15 2013  LIPASE 54*   CBC:  Recent Labs Lab 05/01/15 2013  WBC 13.8*  HGB 13.9  HCT 40.7  MCV 82.4  PLT 223    ProBNP (last 3 results)  Recent Labs  01/18/15 1542 01/29/15 1553 02/02/15 1156  PROBNP 75.0 40.0 150.0*     Radiological Exams on Admission: Ct Abdomen Pelvis W Contrast  05/01/2015  CLINICAL DATA:  Pt presents from home via EMS c/o diarrhea x 2 weeks with occasional bright red appearance and increased gas. Pt is legally blind and therefore is unsure of whether or not she saw blood in her stool. Hx of insulin-dependent diabetes II and diverticulitis EXAM: CT ABDOMEN AND PELVIS WITH CONTRAST TECHNIQUE: Multidetector CT imaging of the abdomen and pelvis was performed using the standard protocol following bolus administration of intravenous contrast. CONTRAST:  161mL OMNIPAQUE IOHEXOL 300 MG/ML  SOLN COMPARISON:  CT, 04/24/2012 FINDINGS: Lung bases: Vascular and interstitial prominence. No overt pulmonary edema or evidence of pneumonia. Heart normal in size. Liver and spleen:  Unremarkable. Gallbladder surgically absent. Mild prominence of the intern  extrahepatic biliary tree, stable. Pancreas:  Normal. Adrenal glands:  No masses. Kidneys, ureters, bladder: Tiny low-density lesion arises  from the anterior lower pole of the left kidney, likely a cyst. This is stable. No other renal masses or lesions. No stones. No hydronephrosis. Ureters normal course and in caliber. Bladder is unremarkable. Uterus and adnexa:  Uterus surgically absent.  No pelvic masses. Lymph nodes:  No adenopathy. Ascites:  None. Vascular: Atherosclerotic changes are noted throughout the abdominal aorta and at the origin of several branch vessels. This is stable. Gastrointestinal: Several scattered colonic diverticula. No diverticulitis. No colon wall thickening or inflammatory changes. No bowel dilation is seen to suggest obstruction or generalized adynamic ileus. Small bowel is unremarkable. Stomach is unremarkable. Musculoskeletal: Bones demineralized. Degenerative changes noted of the lumbar spine. No osteoblastic osteolytic lesions. IMPRESSION: 1. No acute findings. 2. There are scattered colonic diverticula, but no evidence diverticulitis. There are no findings to explain GI bleeding. 3. Chronic findings include changes from a cholecystectomy and hysterectomy and atherosclerotic changes throughout the abdominal aorta and its branch vessels. Electronically Signed   By: Lajean Manes M.D.   On: 05/01/2015 23:26     Assessment/Plan Principal Problem:   Diarrhea with dehydration Continue IV hydration with electrolyte replacement. Start IV metronidazole 500 mg every 8 hours. Still awaiting for C. difficile results. If results are negative, as ciprofloxacin 400 mg IVPB every 12 hours  Active Problems:   Hyperlipidemia Not on medical treatment. Continue diet measures.    Depression Continue imipramine 75 mg by mouth daily at bedtime.    Essential hypertension Continue low-sodium diet. Follow-up blood pressure.    Systemic sclerosis (HCC) Allergies moment, since to be  asymptomatic. Continue low-dose maintenance prednisone 5 mg by mouth daily.    Diabetes mellitus ([place]) The patient can only tolerate Humalog insulin. Continue current home regimen. CBG monitoring while the patient is in the hospital.    Hyponatremia Continue rehydration with normal saline and follow-up sodium level.   Code Status: Full code. DVT Prophylaxis: Mechanical with SCDs.. Family Communication:  Disposition Plan: Admit for IV rehydration and IV metronidazole therapy.  Time spent: Over 70 minutes were spent during the process of this admission  Reubin Milan [facility] Pager [phone].

## 2015-05-02 NOTE — Progress Notes (Signed)
Subjective: Patient admitted this morning, see detailed H&P by Dr. Shanon Brow manual Olevia Bowens. 81 year old female with a history of due to closes, IBS, hypothyroidism, Sjogren syndrome, diabetes mellitus who came to the hospital with 2 weeks of multiple episodes of diarrhea. Patient hasn't had diarrhea since she came to the hospital. Stool for C. difficile is pending. Filed Vitals:   05/02/15 1456  BP: 149/56  Pulse: 76  Temp: 97.7 F (36.5 C)  Resp: 20    Chest: Clear Bilaterally Heart : S1S2 RRR Abdomen: Soft, nontender Ext : No edema Neuro: Alert, oriented x 3  A/P Diarrhea Diverticulitis Hypertension Systems sclerosis  Continue IV hydration. Patient empirically started on Flagyl. Stool for C. difficile has not been obtained yet as patient did not have bowel movement in the hospital.    [place] [facility] Pager- [phone]

## 2015-05-02 NOTE — ED Provider Notes (Signed)
CSN: [id number]     Arrival date & time 05/01/15  1949 History   First MD Initiated Contact with Patient 05/01/15 2005     Chief Complaint  Patient presents with  . Diarrhea     (Consider location/radiation/quality/duration/timing/severity/associated sxs/prior Treatment) HPI.....[name] liquid diarrhea for 2 weeks several episodes per day. Patient feels weak with low energy. She lives alone and is legally blind.  Patient also complains of lower abdominal pain.  Severity is moderate.  Nothing makes symptoms better or worse. She is on amoxicillin presently.  Past Medical History  Diagnosis Date  . Coronary artery disease   . 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[place])   . Hypertension   . Dyslipidemia   . Diabetes mellitus   . CVA (cerebral infarction) 1991  . Macular degeneration   . GERD (gastroesophageal reflux disease)   . Diverticulosis   . IBS (irritable bowel syndrome)   . Zenker's diverticulum   . Emphysema   . Pulmonary hypertension ([place])   . Chronic sinusitis   . Fibromyalgia   . Hypothyroidism   . Sjogren's disease ([place])   . Raynaud's phenomenon   . Cervical disc disease   . Adenomatous colon polyp   . Scleroderma ([place])   . COPD (chronic obstructive pulmonary disease) ([place])   . Arthritis   . Blood transfusion   . Myocardial infarction [facility]) may 27th 2007  . Depression   . Stroke [facility]) 1990    brain stem stroke - weakness rt hand  . Cancer [facility])     cervical cancer pre hysterectomy  . Sleep apnea     STOP BANG SCORE 5  . Legally blind   . CONSTIPATION, CHRONIC 06/26/2007    Qualifier: Diagnosis of  By: Nils Pyle CMA ([place]), Mearl Latin    . Pneumonia 07/16/2013     HX PNEUMONIA  . Pneumonia   . Bronchitis    Past Surgical History  Procedure Laterality Date  . Total abdominal hysterectomy  1973  . Bilateral oophorectomy  2002  . Spinal fusion  1997    and implantation of a plate   . Lobe thyroid removal    . Thyroid lobectomy  1991    removal of left lobe thyroid  .  Cholecystectomy  1965  . Appendectomy    . Cataract extraction    . Bronchoscopy  02-22-09  . Stented cardiac  artery  2007 / 2007 / 2010    X 3 STENT  . Proctoscopy  03/18/2012    Procedure: PROCTOSCOPY;  Surgeon: Adin Hector, MD;  [facility];  Service: General;  Laterality: N/A;  rigid procto  . Colon surgery  2014    colon resection  . Esophagogastroduodenoscopy (egd) with esophageal dilation N/A 06/18/2013    Procedure: ESOPHAGOGASTRODUODENOSCOPY (EGD) WITH ESOPHAGEAL DILATION;  Surgeon: Inda Castle, MD;  Location: [place];  Service: Endoscopy;  Laterality: N/A;  . Cardiac catheterization      x 3  . Colonectomy    . Radiology with anesthesia N/A 03/09/2015    Procedure: MRI CERVICAL SPINE WITHOUT AND LUMBER WITHOUT;  Surgeon: Medication Radiologist, MD;  Location: [place];  Service: Radiology;  Laterality: N/A;   Family History  Problem Relation Age of Onset  . Other Mother 44    Died from sepsis  . Stroke Father 51    died  . Heart disease Father   . Cancer Sister     lung  . Cancer Brother     lung   Social History  Substance Use Topics  . Smoking status: Former Smoker -- 1.00 packs/day for 40 years    Types: Cigarettes    Quit date: 06/26/1988  . Smokeless tobacco: Never Used     Comment: 40 pack years  . Alcohol Use: No   OB History    No data available     Review of Systems  All other systems reviewed and are negative.     Allergies  Lantus; Levaquin; Maxidex; Metoprolol-hydrochlorothiazide; Norvasc; Novolog; Sulfonamide derivatives; Titanium; and Adhesive  Home Medications   Prior to Admission medications   Medication Sig Start Date End Date Taking? Authorizing Provider  acidophilus (RISAQUAD) CAPS capsule Take 1 capsule by mouth daily.   Yes Historical Provider, MD  aspirin EC 81 MG tablet Take 1 tablet (81 mg total) by mouth daily. 11/18/13  Yes Fay Records, MD  cetirizine (ZYRTEC) 10 MG tablet Take 10 mg by mouth every evening.    Yes Historical Provider, MD  esomeprazole (NEXIUM) 40 MG capsule Take 1 capsule (40 mg total) by mouth daily before breakfast. 01/14/13  Yes Fay Records, MD  imipramine (TOFRANIL) 25 MG tablet Take 75 mg by mouth at bedtime.  03/22/11  Yes Historical Provider, MD  insulin lispro (HUMALOG) 100 UNIT/ML injection Inject 4-6 Units into the skin 3 (three) times daily. Take 6 units in AM, 6 units at noon and 4 units at bedtime.   Yes Historical Provider, MD  levothyroxine (SYNTHROID, LEVOTHROID) 100 MCG tablet Take 100 mcg by mouth daily before breakfast.   Yes Historical Provider, MD  mometasone (NASONEX) 50 MCG/ACT nasal spray Place 2 sprays into the nose daily as needed (for nasal congestion).    Yes Historical Provider, MD  OVER THE COUNTER MEDICATION Place 1-2 drops into both eyes 2 (two) times daily as needed (For dry irritated eyes.). Allergy Eye Drops   Yes Historical Provider, MD  predniSONE (DELTASONE) 5 MG tablet Take 5 mg by mouth daily with breakfast.   Yes Historical Provider, MD  triamterene-hydrochlorothiazide (MAXZIDE-25) 37.5-25 MG per tablet Take 1/2 tablet daily Patient taking differently: Take 0.5 tablets by mouth daily.  03/03/15  Yes Fay Records, MD  triazolam (HALCION) 0.25 MG tablet Take 0.125-0.25 mg by mouth at bedtime as needed for sleep. For sleep   Yes Historical Provider, MD   BP 136/49 mmHg  Pulse 74  Temp(Src) 98 F (36.7 C) (Oral)  Resp 16  Ht 5\' 2"  (1.575 m)  Wt 134 lb (60.782 kg)  BMI 24.50 kg/m2  SpO2 97% Physical Exam  Constitutional: She is oriented to person, place, and time. She appears well-developed and well-nourished.  HENT:  Head: Normocephalic and atraumatic.  Eyes: Conjunctivae and EOM are normal. Pupils are equal, round, and reactive to light.  Neck: Normal range of motion. Neck supple.  Cardiovascular: Normal rate and regular rhythm.   Pulmonary/Chest: Effort normal and breath sounds normal.  Abdominal:  Tender lower abdomen  Musculoskeletal:  Normal range of motion.  Neurological: She is alert and oriented to person, place, and time.  Skin: Skin is warm and dry.  Psychiatric: She has a normal mood and affect. Her behavior is normal.  Nursing note and vitals reviewed.   ED Course  Procedures (including critical care time) Labs Review Labs Reviewed  LIPASE, BLOOD - Abnormal; Notable for the following:    Lipase 54 (*)    All other components within normal limits  COMPREHENSIVE METABOLIC PANEL - Abnormal; Notable for the following:    Sodium  126 (*)    Chloride 91 (*)    Glucose, Bld 198 (*)    BUN 23 (*)    Calcium 8.7 (*)    All other components within normal limits  CBC - Abnormal; Notable for the following:    WBC 13.8 (*)    All other components within normal limits  C DIFFICILE QUICK SCREEN W PCR REFLEX  URINALYSIS, ROUTINE W REFLEX MICROSCOPIC (NOT AT [facility])    Imaging Review Ct Abdomen Pelvis W Contrast  05/01/2015  CLINICAL DATA:  Pt presents from home via EMS c/o diarrhea x 2 weeks with occasional bright red appearance and increased gas. Pt is legally blind and therefore is unsure of whether or not she saw blood in her stool. Hx of insulin-dependent diabetes II and diverticulitis EXAM: CT ABDOMEN AND PELVIS WITH CONTRAST TECHNIQUE: Multidetector CT imaging of the abdomen and pelvis was performed using the standard protocol following bolus administration of intravenous contrast. CONTRAST:  157mL OMNIPAQUE IOHEXOL 300 MG/ML  SOLN COMPARISON:  CT, 04/24/2012 FINDINGS: Lung bases: Vascular and interstitial prominence. No overt pulmonary edema or evidence of pneumonia. Heart normal in size. Liver and spleen:  Unremarkable. Gallbladder surgically absent. Mild prominence of the intern extrahepatic biliary tree, stable. Pancreas:  Normal. Adrenal glands:  No masses. Kidneys, ureters, bladder: Tiny low-density lesion arises from the anterior lower pole of the left kidney, likely a cyst. This is stable. No other renal masses  or lesions. No stones. No hydronephrosis. Ureters normal course and in caliber. Bladder is unremarkable. Uterus and adnexa:  Uterus surgically absent.  No pelvic masses. Lymph nodes:  No adenopathy. Ascites:  None. Vascular: Atherosclerotic changes are noted throughout the abdominal aorta and at the origin of several branch vessels. This is stable. Gastrointestinal: Several scattered colonic diverticula. No diverticulitis. No colon wall thickening or inflammatory changes. No bowel dilation is seen to suggest obstruction or generalized adynamic ileus. Small bowel is unremarkable. Stomach is unremarkable. Musculoskeletal: Bones demineralized. Degenerative changes noted of the lumbar spine. No osteoblastic osteolytic lesions. IMPRESSION: 1. No acute findings. 2. There are scattered colonic diverticula, but no evidence diverticulitis. There are no findings to explain GI bleeding. 3. Chronic findings include changes from a cholecystectomy and hysterectomy and atherosclerotic changes throughout the abdominal aorta and its branch vessels. Electronically Signed   By: Lajean Manes M.D.   On: 05/01/2015 23:26   I have personally reviewed and evaluated these images and lab results as part of my medical decision-making.   EKG Interpretation None      MDM   Final diagnoses:  Diarrhea, unspecified type    Patient is elderly with long-standing diarrhea. Admit to general medicine. IV fluids initiated. C. difficile culture ordered.    Nat Christen, MD 05/02/15 [phone]

## 2015-05-03 DIAGNOSIS — Z794 Long term (current) use of insulin: Secondary | ICD-10-CM

## 2015-05-03 DIAGNOSIS — E785 Hyperlipidemia, unspecified: Secondary | ICD-10-CM

## 2015-05-03 DIAGNOSIS — E119 Type 2 diabetes mellitus without complications: Secondary | ICD-10-CM

## 2015-05-03 DIAGNOSIS — E871 Hypo-osmolality and hyponatremia: Secondary | ICD-10-CM

## 2015-05-03 DIAGNOSIS — M349 Systemic sclerosis, unspecified: Secondary | ICD-10-CM

## 2015-05-03 DIAGNOSIS — I10 Essential (primary) hypertension: Secondary | ICD-10-CM

## 2015-05-03 LAB — BASIC METABOLIC PANEL
Anion gap: 6 (ref 5–15)
BUN: 17 mg/dL (ref 6–20)
CO2: 23 mmol/L (ref 22–32)
Calcium: 7.9 mg/dL — ABNORMAL LOW (ref 8.9–10.3)
Chloride: 104 mmol/L (ref 101–111)
Creatinine, Ser: 0.75 mg/dL (ref 0.44–1.00)
GFR calc Af Amer: >60 mL/min (ref >60)
GFR calc non Af Amer: >60 mL/min (ref >60)
Glucose, Bld: 154 mg/dL — ABNORMAL HIGH (ref 65–99)
Potassium: 4.3 mmol/L (ref 3.5–5.1)
Sodium: 133 mmol/L — ABNORMAL LOW (ref 135–145)

## 2015-05-03 LAB — CBC
HCT: 35.7 % — ABNORMAL LOW (ref 36.0–46.0)
Hemoglobin: 12 g/dL (ref 12.0–15.0)
MCH: 27.9 pg (ref 26.0–34.0)
MCHC: 33.6 g/dL (ref 30.0–36.0)
MCV: 83 fL (ref 78.0–100.0)
Platelets: 150 10*3/uL (ref 150–400)
RBC: 4.3 MIL/uL (ref 3.87–5.11)
RDW: 14.7 % (ref 11.5–15.5)
WBC: 8.8 10*3/uL (ref 4.0–10.5)

## 2015-05-03 LAB — GLUCOSE, CAPILLARY
Glucose-Capillary: 183 mg/dL — ABNORMAL HIGH (ref 65–99)
Glucose-Capillary: 156 mg/dL — ABNORMAL HIGH (ref 65–99)
Glucose-Capillary: 120 mg/dL — ABNORMAL HIGH (ref 65–99)

## 2015-05-03 MED ORDER — METRONIDAZOLE 500 MG PO TABS: 500.0000 mg | ORAL_TABLET | Freq: Three times a day (TID) | ORAL | Status: DC

## 2015-05-03 MED ORDER — POTASSIUM CHLORIDE CRYS ER 10 MEQ PO TBCR: 10.0000 meq | EXTENDED_RELEASE_TABLET | Freq: Every day | ORAL | Status: DC

## 2015-05-03 MED ORDER — SACCHAROMYCES BOULARDII 250 MG PO CAPS: 250.0000 mg | ORAL_CAPSULE | Freq: Two times a day (BID) | ORAL | Status: DC

## 2015-05-03 NOTE — Discharge Summary (Signed)
Physician Discharge Summary  [name]:[id number] DOB: 07-18-1930 DOA: 05/01/2015  PCP: Merrilee Seashore, MD  Admit date: 05/01/2015 Discharge date: 05/03/2015  Time spent: 25* minutes  Recommendations for Outpatient Follow-up:  1. *Follow up PCP in 2 weeks 2.  Discharge Diagnoses:  Principal Problem:   Diarrhea with dehydration Active Problems:   Hyperlipidemia   Depression   Essential hypertension   Systemic sclerosis (HCC)   Diabetes mellitus ([place])   Hyponatremia   Discharge Condition: Stable2  Diet recommendation: Diabetic diet  Filed Weights   05/01/15 1956 05/02/15 0150  Weight: 60.782 kg (134 lb) 62.007 kg (136 lb 11.2 oz)    History of present illness:  81 y.o. female with a past medical history of diverticulosis, IBS, hypothyroidism, COPD, CAD, Dressler syndrome, Sjogren's syndrome, hypertension, hyperlipidemia, type 2 diabetes, CVA, macular degeneration, GERD who comes to the emergency department with complaints of having 2 weeks of multiple episodes of diarrhea daily, left lower quadrant abdominal pain, decreased appetite, mild nausea, fatigue and chills, but no fever or emesis. These symptoms started 3 days after the patient finished a ten-day course of amoxicillin for sinusitis. She denies travel history or sick contacts.    Hospital Course:   Diarrhea with dehydration Patient came with diarrhea with dehydration for 2 weeks. Empirically started on Flagyl in the hospital. Patient did not have bowel movement in last 24 hours. At this time symptoms have resolved. C. difficile PCR was ordered but never collected as patient did not have BM. Will discharge the patient on Flagyl 500 mg by mouth every 8 hours for 7 more days as empirically Flagyl improve the symptoms. Patient had CT scan of the abdomen and pelvis which showed diverticulosis  but no diverticulitis.  Hypertension Blood pressure is controlled, patient will continue take Maxide 25 mg by mouth  daily  Hypothyroidism Continue Synthroid  Hyponatremia Patient presented with hyponatremia with sodium 126 likely from the diuretics. Hyponatremia has now resolved today sodium was 133. Will follow PCP for further recommendations regarding blood pressure medications  IBS Patient complaining of symptoms of diarrhea alternating with constipation since she had colon surgery 2 years ago. She takes acidophilus probiotic capsule at home. Will add to 50 mg by mouth twice a day Patient to follow-up with PCP for further management.  Diabetes mellitus Patient is on sliding scale insulin with Humalog which will be continued as outpatient.  GERD Continue Nexium  Sjogren's syndrome Continue by mouth prednisone.  Procedures:  None  Consultations:  None  Discharge Exam: Filed Vitals:   05/03/15 0527  BP: 135/58  Pulse: 72  Temp: 98.5 F (36.9 C)  Resp: 18    General: Appears in no acute distress  Cardiovascular: S1-S2 regular  Respiratory: Clear to auscultation bilaterally  Abdomen - mild tenderness in the left lower quadrant , no guarding or rigidity.  Discharge Instructions   Discharge Instructions    Call MD for:  persistant nausea and vomiting    Complete by:  As directed      Diet Carb Modified    Complete by:  As directed      Increase activity slowly    Complete by:  As directed           Current Discharge Medication List    START taking these medications   Details  metroNIDAZOLE (FLAGYL) 500 MG tablet Take 1 tablet (500 mg total) by mouth 3 (three) times daily. Qty: 21 tablet, Refills: 0    saccharomyces boulardii (FLORASTOR) 250 MG  capsule Take 1 capsule (250 mg total) by mouth 2 (two) times daily. Qty: 60 capsule, Refills: 3      CONTINUE these medications which have NOT CHANGED   Details  acidophilus (RISAQUAD) CAPS capsule Take 1 capsule by mouth daily.    aspirin EC 81 MG tablet Take 1 tablet (81 mg total) by mouth daily. Qty: 90 tablet, Refills:  3   Associated Diagnoses: Coronary atherosclerosis of unspecified type of vessel, native or graft    cetirizine (ZYRTEC) 10 MG tablet Take 10 mg by mouth every evening.    esomeprazole (NEXIUM) 40 MG capsule Take 1 capsule (40 mg total) by mouth daily before breakfast. Qty: 30 capsule, Refills: 6    imipramine (TOFRANIL) 25 MG tablet Take 75 mg by mouth at bedtime.     insulin lispro (HUMALOG) 100 UNIT/ML injection Inject 4-6 Units into the skin 3 (three) times daily. Take 6 units in AM, 6 units at noon and 4 units at bedtime.    levothyroxine (SYNTHROID, LEVOTHROID) 100 MCG tablet Take 100 mcg by mouth daily before breakfast.    mometasone (NASONEX) 50 MCG/ACT nasal spray Place 2 sprays into the nose daily as needed (for nasal congestion).     OVER THE COUNTER MEDICATION Place 1-2 drops into both eyes 2 (two) times daily as needed (For dry irritated eyes.). Allergy Eye Drops    predniSONE (DELTASONE) 5 MG tablet Take 5 mg by mouth daily with breakfast.    triamterene-hydrochlorothiazide (MAXZIDE-25) 37.5-25 MG per tablet Take 1/2 tablet daily Qty: 30 tablet, Refills: 6    triazolam (HALCION) 0.25 MG tablet Take 0.125-0.25 mg by mouth at bedtime as needed for sleep. For sleep       Allergies  Allergen Reactions  . Lantus [Insulin Glargine]     Patient states "sulfate binder causes sinus infection/pneumonia".  [place]ack [place] [Levofloxacin In D5w] Other (See Comments)    Muscle aches  . Maxidex [Dexamethasone]     Insomnia, anxiety, dizziness  . Metoprolol-Hydrochlorothiazide     REACTION: decreased bp  . Norvasc [Amlodipine]     BIL Ankle edema  . Novolog [Insulin Aspart]     Patient states "sulfate binder causes sinus infection/pneumonia". Tolerates Humalog.  . Sulfonamide Derivatives Hives  . Titanium     Cervical plate - had to change for stainless steel  . Adhesive [Tape] Hives and Rash   Follow-up Information    Follow up with [facility], MD In 2 weeks.    Specialty:  Internal Medicine   Contact information:   [address] [place] [address] [phone]        The results of significant diagnostics from this hospitalization (including imaging, microbiology, ancillary and laboratory) are listed below for reference.    Significant Diagnostic Studies: Ct Abdomen Pelvis W Contrast  05/01/2015  CLINICAL DATA:  Pt presents from home via EMS c/o diarrhea x 2 weeks with occasional bright red appearance and increased gas. Pt is legally blind and therefore is unsure of whether or not she saw blood in her stool. Hx of insulin-dependent diabetes II and diverticulitis EXAM: CT ABDOMEN AND PELVIS WITH CONTRAST TECHNIQUE: Multidetector CT imaging of the abdomen and pelvis was performed using the standard protocol following bolus administration of intravenous contrast. CONTRAST:  155mL OMNIPAQUE IOHEXOL 300 MG/ML  SOLN COMPARISON:  CT, 04/24/2012 FINDINGS: Lung bases: Vascular and interstitial prominence. No overt pulmonary edema or evidence of pneumonia. Heart normal in size. Liver and spleen:  Unremarkable. Gallbladder surgically absent. Mild  prominence of the intern extrahepatic biliary tree, stable. Pancreas:  Normal. Adrenal glands:  No masses. Kidneys, ureters, bladder: Tiny low-density lesion arises from the anterior lower pole of the left kidney, likely a cyst. This is stable. No other renal masses or lesions. No stones. No hydronephrosis. Ureters normal course and in caliber. Bladder is unremarkable. Uterus and adnexa:  Uterus surgically absent.  No pelvic masses. Lymph nodes:  No adenopathy. Ascites:  None. Vascular: Atherosclerotic changes are noted throughout the abdominal aorta and at the origin of several branch vessels. This is stable. Gastrointestinal: Several scattered colonic diverticula. No diverticulitis. No colon wall thickening or inflammatory changes. No bowel dilation is seen to suggest obstruction or generalized adynamic  ileus. Small bowel is unremarkable. Stomach is unremarkable. Musculoskeletal: Bones demineralized. Degenerative changes noted of the lumbar spine. No osteoblastic osteolytic lesions. IMPRESSION: 1. No acute findings. 2. There are scattered colonic diverticula, but no evidence diverticulitis. There are no findings to explain GI bleeding. 3. Chronic findings include changes from a cholecystectomy and hysterectomy and atherosclerotic changes throughout the abdominal aorta and its branch vessels. Electronically Signed   By: Lajean Manes M.D.   On: 05/01/2015 23:26    Microbiology: No results found for this or any previous visit (from the past 240 hour(s)).   Labs: Basic Metabolic Panel:  Recent Labs Lab 05/01/15 2013 05/02/15 0522 05/03/15 0445  NA 126* 129* 133*  K 4.0 3.8 4.3  CL 91* 97* 104  CO2 25 24 23   GLUCOSE 198* 216* 154*  BUN 23* 19 17  CREATININE 0.71 0.69 0.75  CALCIUM 8.7* 7.9* 7.9*  MG  --  1.7  --   PHOS  --  2.9  --    Liver Function Tests:  Recent Labs Lab 05/01/15 2013 05/02/15 0522  AST 23 20  ALT 24 21  ALKPHOS 52 47  BILITOT 0.5 0.5  PROT 6.8 5.8*  ALBUMIN 3.9 3.3*    Recent Labs Lab 05/01/15 2013  LIPASE 54*   No results for input(s): AMMONIA in the last 168 hours. CBC:  Recent Labs Lab 05/01/15 2013 05/02/15 0522 05/03/15 0445  WBC 13.8* 10.2 8.8  HGB 13.9 13.2 12.0  HCT 40.7 39.2 35.7*  MCV 82.4 83.6 83.0  PLT 223 166 150   Cardiac Enzymes: No results for input(s): CKTOTAL, CKMB, CKMBINDEX, TROPONINI in the last 168 hours. BNP: BNP (last 3 results) No results for input(s): BNP in the last 8760 hours.  ProBNP (last 3 results)  Recent Labs  01/18/15 1542 01/29/15 1553 02/02/15 1156  PROBNP 75.0 40.0 150.0*    CBG:  Recent Labs Lab 05/02/15 0751 05/02/15 1152 05/02/15 1735 05/03/15 0723  GLUCAP 180* 189* 183* 156*       Signed:  Endia Moncur S  [facility] 05/03/2015, 8:52 AM

## 2015-05-04 DIAGNOSIS — E782 Mixed hyperlipidemia: Secondary | ICD-10-CM | POA: Diagnosis not present

## 2015-05-04 DIAGNOSIS — I10 Essential (primary) hypertension: Secondary | ICD-10-CM | POA: Diagnosis not present

## 2015-05-04 DIAGNOSIS — E039 Hypothyroidism, unspecified: Secondary | ICD-10-CM | POA: Diagnosis not present

## 2015-05-05 ENCOUNTER — Other Ambulatory Visit: Payer: Self-pay | Admitting: *Deleted

## 2015-05-05 ENCOUNTER — Ambulatory Visit: Payer: Medicare Other | Admitting: Physician Assistant

## 2015-05-05 ENCOUNTER — Telehealth: Payer: Self-pay | Admitting: Internal Medicine

## 2015-05-05 MED ORDER — PREDNISONE 5 MG PO TABS: 5.0000 mg | ORAL_TABLET | Freq: Every day | ORAL | Status: DC

## 2015-05-05 NOTE — Telephone Encounter (Signed)
Pt called because she is scheduled to come for blood work tomorrow (BMET) 05/06/15. Pt  was in the hospital for diarrhea and dehydration from 11/5 to 05/02/12. Blood work was done then. Pt's sodium on 04/02/15 was 125 (L) while in the hospital  On 05/05/15 blood work was  129 (L) pt think it is too early to have repeat labs. Lab appointment was cancelled. Pt would like for Dr. Harrington Challenger to know her recent labs, and if she needs blood work done later on.

## 2015-05-05 NOTE — Telephone Encounter (Signed)
New message    Patient is asking does she need to have lab work done on 11.10.2016  Since she was recent in hospital  On 11.5.2016

## 2015-05-06 ENCOUNTER — Other Ambulatory Visit: Payer: Medicare Other

## 2015-05-07 NOTE — Telephone Encounter (Signed)
Electrolytes look good.

## 2015-05-10 NOTE — Telephone Encounter (Signed)
Attempted to call patient to inform that  her electrolytes look good (from 11/7) and that  she is due to come back to see Dr. Harrington Challenger in February. No further lab work was ordered by Dr. Harrington Challenger at this time  Left a message to call back.

## 2015-05-11 NOTE — Telephone Encounter (Signed)
F/u  Pt returning RN phone call. Please call back and discuss.   

## 2015-05-11 NOTE — Telephone Encounter (Signed)
The pt is advised and she verbalized understanding. 4 month f/u Appt scheduled for 08/02/15 with Dr Harrington Challenger and the pt is in agreement with date and time of f/u.

## 2015-05-13 ENCOUNTER — Ambulatory Visit: Payer: Medicare Other | Admitting: Internal Medicine

## 2015-05-16 ENCOUNTER — Emergency Department (HOSPITAL_COMMUNITY): Payer: Medicare Other

## 2015-05-16 ENCOUNTER — Encounter (HOSPITAL_COMMUNITY): Payer: Self-pay | Admitting: Emergency Medicine

## 2015-05-16 ENCOUNTER — Inpatient Hospital Stay (HOSPITAL_COMMUNITY)
Admission: EM | Admit: 2015-05-16 | Discharge: 2015-05-19 | DRG: 871 | Disposition: A | Discharge status: Home Health | Payer: Medicare Other | Attending: Internal Medicine | Admitting: Internal Medicine

## 2015-05-16 DIAGNOSIS — R5381 Other malaise: Secondary | ICD-10-CM | POA: Diagnosis not present

## 2015-05-16 DIAGNOSIS — J8489 Other specified interstitial pulmonary diseases: Secondary | ICD-10-CM | POA: Diagnosis not present

## 2015-05-16 DIAGNOSIS — I272 Other secondary pulmonary hypertension: Secondary | ICD-10-CM | POA: Diagnosis present

## 2015-05-16 DIAGNOSIS — G8929 Other chronic pain: Secondary | ICD-10-CM | POA: Diagnosis present

## 2015-05-16 DIAGNOSIS — F329 Major depressive disorder, single episode, unspecified: Secondary | ICD-10-CM | POA: Diagnosis present

## 2015-05-16 DIAGNOSIS — Y95 Nosocomial condition: Secondary | ICD-10-CM | POA: Diagnosis present

## 2015-05-16 DIAGNOSIS — I10 Essential (primary) hypertension: Secondary | ICD-10-CM | POA: Diagnosis present

## 2015-05-16 DIAGNOSIS — K589 Irritable bowel syndrome without diarrhea: Secondary | ICD-10-CM | POA: Diagnosis present

## 2015-05-16 DIAGNOSIS — G4733 Obstructive sleep apnea (adult) (pediatric): Secondary | ICD-10-CM | POA: Diagnosis present

## 2015-05-16 DIAGNOSIS — Z8673 Personal history of transient ischemic attack (TIA), and cerebral infarction without residual deficits: Secondary | ICD-10-CM | POA: Diagnosis not present

## 2015-05-16 DIAGNOSIS — E119 Type 2 diabetes mellitus without complications: Secondary | ICD-10-CM | POA: Diagnosis not present

## 2015-05-16 DIAGNOSIS — M3500 Sicca syndrome, unspecified: Secondary | ICD-10-CM | POA: Diagnosis present

## 2015-05-16 DIAGNOSIS — R509 Fever, unspecified: Secondary | ICD-10-CM | POA: Diagnosis not present

## 2015-05-16 DIAGNOSIS — Z87891 Personal history of nicotine dependence: Secondary | ICD-10-CM | POA: Diagnosis not present

## 2015-05-16 DIAGNOSIS — A419 Sepsis, unspecified organism: Secondary | ICD-10-CM | POA: Diagnosis not present

## 2015-05-16 DIAGNOSIS — A403 Sepsis due to Streptococcus pneumoniae: Secondary | ICD-10-CM | POA: Diagnosis not present

## 2015-05-16 DIAGNOSIS — R5383 Other fatigue: Secondary | ICD-10-CM | POA: Diagnosis not present

## 2015-05-16 DIAGNOSIS — Z7982 Long term (current) use of aspirin: Secondary | ICD-10-CM

## 2015-05-16 DIAGNOSIS — M542 Cervicalgia: Secondary | ICD-10-CM | POA: Diagnosis present

## 2015-05-16 DIAGNOSIS — R0902 Hypoxemia: Secondary | ICD-10-CM | POA: Diagnosis not present

## 2015-05-16 DIAGNOSIS — Z955 Presence of coronary angioplasty implant and graft: Secondary | ICD-10-CM

## 2015-05-16 DIAGNOSIS — M797 Fibromyalgia: Secondary | ICD-10-CM | POA: Diagnosis present

## 2015-05-16 DIAGNOSIS — R1310 Dysphagia, unspecified: Secondary | ICD-10-CM | POA: Diagnosis present

## 2015-05-16 DIAGNOSIS — Z79899 Other long term (current) drug therapy: Secondary | ICD-10-CM | POA: Diagnosis not present

## 2015-05-16 DIAGNOSIS — J440 Chronic obstructive pulmonary disease with acute lower respiratory infection: Secondary | ICD-10-CM | POA: Diagnosis present

## 2015-05-16 DIAGNOSIS — K219 Gastro-esophageal reflux disease without esophagitis: Secondary | ICD-10-CM | POA: Diagnosis present

## 2015-05-16 DIAGNOSIS — M341 CR(E)ST syndrome: Secondary | ICD-10-CM | POA: Diagnosis present

## 2015-05-16 DIAGNOSIS — E7800 Pure hypercholesterolemia, unspecified: Secondary | ICD-10-CM | POA: Diagnosis present

## 2015-05-16 DIAGNOSIS — I241 Dressler's syndrome: Secondary | ICD-10-CM | POA: Diagnosis present

## 2015-05-16 DIAGNOSIS — Z981 Arthrodesis status: Secondary | ICD-10-CM | POA: Diagnosis not present

## 2015-05-16 DIAGNOSIS — E039 Hypothyroidism, unspecified: Secondary | ICD-10-CM | POA: Diagnosis present

## 2015-05-16 DIAGNOSIS — J189 Pneumonia, unspecified organism: Secondary | ICD-10-CM | POA: Diagnosis not present

## 2015-05-16 DIAGNOSIS — H3530 Unspecified macular degeneration: Secondary | ICD-10-CM | POA: Diagnosis present

## 2015-05-16 DIAGNOSIS — Z794 Long term (current) use of insulin: Secondary | ICD-10-CM

## 2015-05-16 DIAGNOSIS — H548 Legal blindness, as defined in USA: Secondary | ICD-10-CM | POA: Diagnosis present

## 2015-05-16 DIAGNOSIS — I2510 Atherosclerotic heart disease of native coronary artery without angina pectoris: Secondary | ICD-10-CM | POA: Diagnosis present

## 2015-05-16 DIAGNOSIS — Z8541 Personal history of malignant neoplasm of cervix uteri: Secondary | ICD-10-CM

## 2015-05-16 LAB — CBC
HCT: 40.3 % (ref 36.0–46.0)
Hemoglobin: 13.4 g/dL (ref 12.0–15.0)
MCH: 28.1 pg (ref 26.0–34.0)
MCHC: 33.3 g/dL (ref 30.0–36.0)
MCV: 84.5 fL (ref 78.0–100.0)
Platelets: 177 10*3/uL (ref 150–400)
RBC: 4.77 MIL/uL (ref 3.87–5.11)
RDW: 15.7 % — ABNORMAL HIGH (ref 11.5–15.5)
WBC: 18.6 10*3/uL — ABNORMAL HIGH (ref 4.0–10.5)

## 2015-05-16 LAB — URINALYSIS, ROUTINE W REFLEX MICROSCOPIC
Bilirubin Urine: NEGATIVE
Glucose, UA: 250 mg/dL — AB
Hgb urine dipstick: NEGATIVE
Ketones, ur: NEGATIVE mg/dL
Leukocytes, UA: NEGATIVE
Nitrite: NEGATIVE
Protein, ur: NEGATIVE mg/dL
Specific Gravity, Urine: 1.015 (ref 1.005–1.030)
pH: 5.5 (ref 5.0–8.0)

## 2015-05-16 LAB — BASIC METABOLIC PANEL
Anion gap: 12 (ref 5–15)
BUN: 26 mg/dL — ABNORMAL HIGH (ref 6–20)
CO2: 25 mmol/L (ref 22–32)
Calcium: 8.7 mg/dL — ABNORMAL LOW (ref 8.9–10.3)
Chloride: 92 mmol/L — ABNORMAL LOW (ref 101–111)
Creatinine, Ser: 0.9 mg/dL (ref 0.44–1.00)
GFR calc Af Amer: >60 mL/min (ref >60)
GFR calc non Af Amer: 58 mL/min — ABNORMAL LOW (ref >60)
Glucose, Bld: 264 mg/dL — ABNORMAL HIGH (ref 65–99)
Potassium: 4.3 mmol/L (ref 3.5–5.1)
Sodium: 129 mmol/L — ABNORMAL LOW (ref 135–145)

## 2015-05-16 LAB — LACTIC ACID, PLASMA
Lactic Acid, Venous: 2.4 mmol/L (ref 0.5–2.0)
Lactic Acid, Venous: 2.5 mmol/L (ref 0.5–2.0)

## 2015-05-16 LAB — INFLUENZA PANEL BY PCR (TYPE A & B)
H1N1 flu by pcr: NOT DETECTED
Influenza A By PCR: NEGATIVE
Influenza B By PCR: NEGATIVE

## 2015-05-16 LAB — PROCALCITONIN: Procalcitonin: 0.94 ng/mL

## 2015-05-16 LAB — APTT: aPTT: 28 seconds (ref 24–37)

## 2015-05-16 LAB — PROTIME-INR
INR: 1.05 (ref 0.00–1.49)
Prothrombin Time: 14 seconds (ref 11.6–15.2)

## 2015-05-16 LAB — I-STAT CG4 LACTIC ACID, ED
Lactic Acid, Venous: 2.34 mmol/L (ref 0.5–2.0)
Lactic Acid, Venous: 2.17 mmol/L (ref 0.5–2.0)

## 2015-05-16 LAB — GLUCOSE, CAPILLARY: Glucose-Capillary: 340 mg/dL — ABNORMAL HIGH (ref 65–99)

## 2015-05-16 LAB — I-STAT TROPONIN, ED: Troponin i, poc: 0.04 ng/mL (ref 0.00–0.08)

## 2015-05-16 LAB — CBG MONITORING, ED: Glucose-Capillary: 239 mg/dL — ABNORMAL HIGH (ref 65–99)

## 2015-05-16 MED ORDER — GUAIFENESIN-DM 100-10 MG/5ML PO SYRP: 5.0000 mL | ORAL_SOLUTION | ORAL | Status: DC | PRN

## 2015-05-16 MED ORDER — IMIPRAMINE HCL 50 MG PO TABS
75.0000 mg | ORAL_TABLET | Freq: Every day | ORAL | Status: DC
  Administered 2015-05-16 – 2015-05-18 (×3): 75 mg via ORAL
  Filled 2015-05-16 (×6): qty 1

## 2015-05-16 MED ORDER — PREDNISONE 10 MG PO TABS
10.0000 mg | ORAL_TABLET | Freq: Every day | ORAL | Status: DC
  Administered 2015-05-17 – 2015-05-19 (×3): 10 mg via ORAL
  Filled 2015-05-16 (×4): qty 1

## 2015-05-16 MED ORDER — FAMOTIDINE 20 MG PO TABS
20.0000 mg | ORAL_TABLET | Freq: Every day | ORAL | Status: DC
  Administered 2015-05-17 – 2015-05-19 (×3): 20 mg via ORAL
  Filled 2015-05-16 (×3): qty 1

## 2015-05-16 MED ORDER — VANCOMYCIN HCL IN DEXTROSE 1-5 GM/200ML-% IV SOLN
1000.0000 mg | INTRAVENOUS | Status: DC
  Administered 2015-05-16 – 2015-05-18 (×3): 1000 mg via INTRAVENOUS
  Filled 2015-05-16 (×4): qty 200

## 2015-05-16 MED ORDER — GUAIFENESIN ER 600 MG PO TB12
1200.0000 mg | ORAL_TABLET | Freq: Two times a day (BID) | ORAL | Status: DC
  Administered 2015-05-16 – 2015-05-19 (×6): 1200 mg via ORAL
  Filled 2015-05-16 (×7): qty 2

## 2015-05-16 MED ORDER — NYSTATIN 100000 UNIT/ML MT SUSP
5.0000 mL | Freq: Four times a day (QID) | OROMUCOSAL | Status: DC
  Administered 2015-05-16 – 2015-05-19 (×10): 500000 [IU] via ORAL
  Filled 2015-05-16 (×7): qty 5

## 2015-05-16 MED ORDER — SODIUM CHLORIDE 0.9 % IV BOLUS (SEPSIS)
1000.0000 mL | INTRAVENOUS | Status: AC
  Administered 2015-05-16: 1000 mL via INTRAVENOUS

## 2015-05-16 MED ORDER — LORATADINE 10 MG PO TABS
10.0000 mg | ORAL_TABLET | Freq: Every day | ORAL | Status: DC
  Administered 2015-05-17 – 2015-05-19 (×3): 10 mg via ORAL
  Filled 2015-05-16 (×3): qty 1

## 2015-05-16 MED ORDER — ALBUTEROL SULFATE (2.5 MG/3ML) 0.083% IN NEBU: 2.5000 mg | INHALATION_SOLUTION | RESPIRATORY_TRACT | Status: DC | PRN

## 2015-05-16 MED ORDER — ASPIRIN EC 81 MG PO TBEC
81.0000 mg | DELAYED_RELEASE_TABLET | Freq: Every day | ORAL | Status: DC
  Administered 2015-05-16 – 2015-05-19 (×4): 81 mg via ORAL
  Filled 2015-05-16 (×4): qty 1

## 2015-05-16 MED ORDER — LEVOTHYROXINE SODIUM 100 MCG PO TABS
100.0000 ug | ORAL_TABLET | Freq: Every day | ORAL | Status: DC
  Administered 2015-05-17 – 2015-05-19 (×3): 100 ug via ORAL
  Filled 2015-05-16 (×3): qty 1

## 2015-05-16 MED ORDER — OXYCODONE HCL 5 MG PO TABS: 5.0000 mg | ORAL_TABLET | ORAL | Status: DC | PRN

## 2015-05-16 MED ORDER — TRIAZOLAM 0.125 MG PO TABS
0.1250 mg | ORAL_TABLET | Freq: Every evening | ORAL | Status: DC | PRN
  Administered 2015-05-16 – 2015-05-18 (×3): 0.125 mg via ORAL
  Filled 2015-05-16 (×3): qty 1

## 2015-05-16 MED ORDER — SENNOSIDES-DOCUSATE SODIUM 8.6-50 MG PO TABS: 1.0000 | ORAL_TABLET | Freq: Every evening | ORAL | Status: DC | PRN

## 2015-05-16 MED ORDER — SODIUM CHLORIDE 0.9 % IV SOLN
INTRAVENOUS | Status: AC
  Administered 2015-05-16 – 2015-05-18 (×2): via INTRAVENOUS

## 2015-05-16 MED ORDER — PIPERACILLIN-TAZOBACTAM 3.375 G IVPB 30 MIN: 3.3750 g | Freq: Three times a day (TID) | INTRAVENOUS | Status: DC

## 2015-05-16 MED ORDER — RISAQUAD PO CAPS: 1.0000 | ORAL_CAPSULE | Freq: Every day | ORAL | Status: DC

## 2015-05-16 MED ORDER — ACETAMINOPHEN 650 MG RE SUPP: 650.0000 mg | Freq: Four times a day (QID) | RECTAL | Status: DC | PRN

## 2015-05-16 MED ORDER — INSULIN LISPRO 100 UNIT/ML ~~LOC~~ SOLN: 4.0000 [IU] | Freq: Three times a day (TID) | SUBCUTANEOUS | Status: DC

## 2015-05-16 MED ORDER — INSULIN LISPRO 100 UNIT/ML ~~LOC~~ SOLN
6.0000 [IU] | Freq: Two times a day (BID) | SUBCUTANEOUS | Status: DC
  Administered 2015-05-17 – 2015-05-18 (×2): 6 [IU] via SUBCUTANEOUS

## 2015-05-16 MED ORDER — SACCHAROMYCES BOULARDII 250 MG PO CAPS
250.0000 mg | ORAL_CAPSULE | Freq: Two times a day (BID) | ORAL | Status: DC
  Administered 2015-05-16 – 2015-05-19 (×6): 250 mg via ORAL
  Filled 2015-05-16 (×6): qty 1

## 2015-05-16 MED ORDER — FLUTICASONE PROPIONATE 50 MCG/ACT NA SUSP
1.0000 | Freq: Every day | NASAL | Status: DC
  Administered 2015-05-17 – 2015-05-18 (×2): 1 via NASAL
  Filled 2015-05-16: qty 16

## 2015-05-16 MED ORDER — PIPERACILLIN-TAZOBACTAM 3.375 G IVPB 30 MIN
3.3750 g | Freq: Once | INTRAVENOUS | Status: AC
  Administered 2015-05-16: 3.375 g via INTRAVENOUS
  Filled 2015-05-16: qty 50

## 2015-05-16 MED ORDER — SODIUM CHLORIDE 0.9 % IJ SOLN
3.0000 mL | Freq: Two times a day (BID) | INTRAMUSCULAR | Status: DC
  Administered 2015-05-17: 3 mL via INTRAVENOUS

## 2015-05-16 MED ORDER — ACETAMINOPHEN 325 MG PO TABS: 650.0000 mg | ORAL_TABLET | Freq: Four times a day (QID) | ORAL | Status: DC | PRN

## 2015-05-16 MED ORDER — INSULIN LISPRO 100 UNIT/ML ~~LOC~~ SOLN
4.0000 [IU] | Freq: Every day | SUBCUTANEOUS | Status: DC
  Administered 2015-05-16 – 2015-05-17 (×2): 4 [IU] via SUBCUTANEOUS
  Filled 2015-05-16: qty 10

## 2015-05-16 MED ORDER — ALUM & MAG HYDROXIDE-SIMETH 200-200-20 MG/5ML PO SUSP: 30.0000 mL | Freq: Four times a day (QID) | ORAL | Status: DC | PRN

## 2015-05-16 MED ORDER — ENOXAPARIN SODIUM 30 MG/0.3ML ~~LOC~~ SOLN
30.0000 mg | SUBCUTANEOUS | Status: DC
  Administered 2015-05-16: 30 mg via SUBCUTANEOUS
  Filled 2015-05-16: qty 0.3

## 2015-05-16 MED ORDER — ONDANSETRON HCL 4 MG/2ML IJ SOLN: 4.0000 mg | Freq: Three times a day (TID) | INTRAMUSCULAR | Status: AC | PRN

## 2015-05-16 MED ORDER — CETYLPYRIDINIUM CHLORIDE 0.05 % MT LIQD
7.0000 mL | Freq: Two times a day (BID) | OROMUCOSAL | Status: DC
  Administered 2015-05-16 – 2015-05-19 (×6): 7 mL via OROMUCOSAL

## 2015-05-16 MED ORDER — PIPERACILLIN-TAZOBACTAM 3.375 G IVPB
3.3750 g | Freq: Three times a day (TID) | INTRAVENOUS | Status: DC
  Administered 2015-05-17 – 2015-05-19 (×8): 3.375 g via INTRAVENOUS
  Filled 2015-05-16 (×12): qty 50

## 2015-05-16 NOTE — Progress Notes (Signed)
Attempted to take report 2 times, said ER nurse will call me back.

## 2015-05-16 NOTE — ED Notes (Signed)
Attempt to call report x 1; RN states shes in middle of shift report; call back number and name provided; states she will call back in 5 mins

## 2015-05-16 NOTE — H&P (Signed)
[facility] History and Physical                                                                                    [name], is a 81 y.o. female  MRN: [id number]   DOB - February 17, 1931  Admit Date - 05/16/2015  Outpatient Primary MD for the patient is Merrilee Seashore, MD  Referring Physician:  Dr. Audie Pinto  Chief Complaint:   Chief Complaint  Patient presents with  . Fatigue  . Chest Pain  . Back Pain     HPI  [name]  is a 81 y.o. female, with scleroderma with CREST syndrome, diabetes mellitus and obstructive sleep apnea,, and hypothyroidism. She presents to the emergency department with fatigue and malaise. The patient reports she felt great yesterday. 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 This morning she woke up at approximately 6:30 AM and felt as though she had low blood sugar. She was clammy, weak and shaky. She found that she was too weak to stand and short of breath. She complains of nausea and mild sore throat. In the ambulance she was found to have a temperature of 100.6.  Chest x-ray in the ER shows right-sided middle lobe pneumonia. The patient reports that she frequently has pneumonia. In the past after having pneumonia she has developed BOOP. Her pulmonologist is Dr. Halford Chessman, and she sees him regularly. The patient was recently discharged after a 2 day stay in [facility] for diarrhea. She completed her Flagyl and the diarrhea has resolved.  She does complain of a sore throat and believes that she may have a yeast infection in her mouth and throat after having been on antibiotics. She denies significant cough and sputum production.    In the emergency department her WBC was high at 18.6, and lactic acid was 2.34, these numbers, combined with her fever meets criteria for sepsis.   Past Medical History  Past Medical History  Diagnosis Date  . Coronary artery disease   . 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 Hypertension   . Dyslipidemia   . Diabetes  mellitus   . CVA (cerebral infarction) 1991  . Macular degeneration   . GERD (gastroesophageal reflux disease)   . Diverticulosis   . IBS (irritable bowel syndrome)   . Zenker's diverticulum   . Emphysema   . Pulmonary hypertension ([place])   . Chronic sinusitis   . Fibromyalgia   . Hypothyroidism   . Sjogren's disease ([place])   . Raynaud's phenomenon   . Cervical disc disease   . Adenomatous colon polyp   . Scleroderma ([place])   . COPD (chronic obstructive pulmonary disease) ([place])   . Arthritis   . Blood transfusion   . Myocardial infarction [facility]) may 27th 2007  . Depression   . Stroke [facility]) 1990    brain stem stroke - weakness rt hand  . Cancer [facility])     cervical cancer pre hysterectomy  . Sleep apnea     STOP BANG SCORE 5  . Legally blind   . CONSTIPATION, CHRONIC 06/26/2007    Qualifier: Diagnosis of  By: Nils Pyle CMA ([place]), Mearl Latin    .  Pneumonia 07/16/2013     HX PNEUMONIA  . Pneumonia   . Bronchitis     Past Surgical History  Procedure Laterality Date  . Total abdominal hysterectomy  1973  . Bilateral oophorectomy  2002  . Spinal fusion  1997    and implantation of a plate   . Lobe thyroid removal    . Thyroid lobectomy  1991    removal of left lobe thyroid  . Cholecystectomy  1965  . Appendectomy    . Cataract extraction    . Bronchoscopy  02-22-09  . Stented cardiac  artery  2007 / 2007 / 2010    X 3 STENT  . Proctoscopy  03/18/2012    Procedure: PROCTOSCOPY;  Surgeon: Adin Hector, MD;  Location: [facility];  Service: General;  Laterality: N/A;  rigid procto  . Colon surgery  2014    colon resection  . Esophagogastroduodenoscopy (egd) with esophageal dilation N/A 06/18/2013    Procedure: ESOPHAGOGASTRODUODENOSCOPY (EGD) WITH ESOPHAGEAL DILATION;  Surgeon: Inda Castle, MD;  Location: [place];  Service: Endoscopy;  Laterality: N/A;  . Cardiac catheterization      x 3  . Colonectomy    . Radiology with anesthesia N/A 03/09/2015    Procedure: MRI  CERVICAL SPINE WITHOUT AND LUMBER WITHOUT;  Surgeon: Medication Radiologist, MD;  Location: [place];  Service: Radiology;  Laterality: N/A;      Social History Social History  Substance Use Topics  . Smoking status: Former Smoker -- 1.00 packs/day for 40 years    Types: Cigarettes    Quit date: 06/26/1988  . Smokeless tobacco: Never Used     Comment: 40 pack years  . Alcohol Use: No   lives alone, ambulatory, independent with ADLs.  Family History Family History  Problem Relation Age of Onset  . Other Mother 72    Died from sepsis  . Stroke Father 66    died  . Heart disease Father   . Diabetes Father   . Cancer Sister     lung  . Diabetes Sister   . Cancer Brother     lung  . Diabetes Brother   . Diabetes Brother     Prior to Admission medications   Medication Sig Start Date End Date Taking? Authorizing Provider  acidophilus (RISAQUAD) CAPS capsule Take 1 capsule by mouth daily.    Historical Provider, MD  aspirin EC 81 MG tablet Take 1 tablet (81 mg total) by mouth daily. 11/18/13   Fay Records, MD  cetirizine (ZYRTEC) 10 MG tablet Take 10 mg by mouth every evening.    Historical Provider, MD  esomeprazole (NEXIUM) 40 MG capsule Take 1 capsule (40 mg total) by mouth daily before breakfast. 01/14/13   Fay Records, MD  imipramine (TOFRANIL) 25 MG tablet Take 75 mg by mouth at bedtime.  03/22/11   Historical Provider, MD  insulin lispro (HUMALOG) 100 UNIT/ML injection Inject 4-6 Units into the skin 3 (three) times daily. Take 6 units in AM, 6 units at noon and 4 units at bedtime.    Historical Provider, MD  levothyroxine (SYNTHROID, LEVOTHROID) 100 MCG tablet Take 100 mcg by mouth daily before breakfast.    Historical Provider, MD  metroNIDAZOLE (FLAGYL) 500 MG tablet Take 1 tablet (500 mg total) by mouth 3 (three) times daily. 05/03/15   Oswald Hillock, MD  mometasone (NASONEX) 50 MCG/ACT nasal spray Place 2 sprays into the nose daily as needed (for nasal congestion).  Historical Provider, MD  OVER THE COUNTER MEDICATION Place 1-2 drops into both eyes 2 (two) times daily as needed (For dry irritated eyes.). Allergy Eye Drops    Historical Provider, MD  predniSONE (DELTASONE) 5 MG tablet Take 1 tablet (5 mg total) by mouth daily with breakfast. 05/05/15   Chesley Mires, MD  saccharomyces boulardii (FLORASTOR) 250 MG capsule Take 1 capsule (250 mg total) by mouth 2 (two) times daily. 05/03/15   Oswald Hillock, MD  triamterene-hydrochlorothiazide (MAXZIDE-25) 37.5-25 MG per tablet Take 1/2 tablet daily Patient taking differently: Take 0.5 tablets by mouth daily.  03/03/15   Fay Records, MD  triazolam (HALCION) 0.25 MG tablet Take 0.125-0.25 mg by mouth at bedtime as needed for sleep. For sleep    Historical Provider, MD    Allergies  Allergen Reactions  . Lantus [Insulin Glargine]     Patient states "sulfate binder causes sinus infection/pneumonia".  Mack Hook [Levofloxacin In D5w] Other (See Comments)    Muscle aches  . Maxidex [Dexamethasone]     Insomnia, anxiety, dizziness  . Metoprolol-Hydrochlorothiazide     REACTION: decreased bp  . Norvasc [Amlodipine]     BIL Ankle edema  . Novolog [Insulin Aspart]     Patient states "sulfate binder causes sinus infection/pneumonia". Tolerates Humalog.  . Sulfonamide Derivatives Hives  . Titanium     Cervical plate - had to change for stainless steel  . Adhesive [Tape] Hives and Rash    Physical Exam  Vitals  Blood pressure 118/45, pulse 84, temperature 99.5 F (37.5 C), temperature source Rectal, resp. rate 11, SpO2 96 %.   General:  Well-developed, well-nourished pleasant female, lying in bed in NAD, nasal cannula in place  Psych:  Normal affect and insight, Not Suicidal or Homicidal, Awake Alert, Oriented X 3.  Neuro:   No F.N deficits, ALL C.Nerves Intact, Strength 5/5 all 4 extremities, Sensation intact all 4 extremities.  ENT:  Ears and Eyes appear Normal, Conjunctivae clear, PER. Moist oral mucosa  without erythema or exudates.  Neck:  Supple, No lymphadenopathy appreciated  Respiratory:  Symmetrical chest wall movement, Good air movement bilaterally, CTAB.  Cardiac:  RRR, No Murmurs, no LE edema noted, no JVD.    Abdomen:  Positive bowel sounds, Soft, Non tender, Non distended,  No masses appreciated  Skin:  No Cyanosis, Normal Skin Turgor, No Skin Rash or Bruise.  Extremities:  Able to move all 4. 5/5 strength in each,  no effusions.  Data Review  Wt Readings from Last 3 Encounters:  05/02/15 62.007 kg (136 lb 11.2 oz)  04/02/15 62.197 kg (137 lb 1.9 oz)  03/18/15 63.05 kg (139 lb)    CBC  Recent Labs Lab 05/16/15 1340  WBC 18.6*  HGB 13.4  HCT 40.3  PLT 177  MCV 84.5  MCH 28.1  MCHC 33.3  RDW 15.7*    Chemistries   Recent Labs Lab 05/16/15 1340  NA 129*  K 4.3  CL 92*  CO2 25  GLUCOSE 264*  BUN 26*  CREATININE 0.90  CALCIUM 8.7*     Lab Results  Component Value Date   HGBA1C 7.2* 03/19/2012    Urinalysis    Component Value Date/Time   COLORURINE YELLOW 05/16/2015 1443   APPEARANCEUR CLEAR 05/16/2015 1443   LABSPEC 1.015 05/16/2015 1443   PHURINE 5.5 05/16/2015 1443   GLUCOSEU 250* 05/16/2015 [place] 03/24/2010 [place] 05/16/2015 1443   [place] 05/16/2015 1443  KETONESUR NEGATIVE 05/16/2015 1443   PROTEINUR NEGATIVE 05/16/2015 1443   UROBILINOGEN 0.2 05/01/2015 2019   NITRITE NEGATIVE 05/16/2015 1443   LEUKOCYTESUR NEGATIVE 05/16/2015 1443    Imaging results:   Dg Chest 2 View  05/16/2015  CLINICAL DATA:  Pt c/o sharp pain between her shoulder blades, fever, clammy skin, dizziness, nausea, and SOB since yesterday morning. Hx of CAD, HTN, DM, GERD, emphysema, pneumonia, COPD, MI, and bronchitis. Pt is an ex-smoker. EXAM: CHEST  2 VIEW COMPARISON:  01/29/2015 FINDINGS: Surgical changes about the low left neck. Midline trachea. Normal heart size. Atherosclerosis in the transverse aorta.  No pleural effusion or pneumothorax. Interstitial thickening is chronic. Superimposed patchy opacity within the right mid and lower lung zone on the frontal radiograph. This may correspond increased density anteriorly on the lateral view. There is also an area of right upper lobe pulmonary opacity laterally which has a somewhat nodular component. IMPRESSION: increased right-sided interstitial and airspace opacities, suspicious for right middle lobe pneumonia superimposed upon COPD/ chronic bronchitis. A right upper lobe opacity is also favored to be infectious but is somewhat nodular and new since 01/29/2015. Recommend appropriate antibiotic therapy and radiographic follow-up in 1-2 weeks. If this area of right upper lobe nodularity is persistent, chest CT would be recommended. Atherosclerosis in the transverse aorta. Electronically Signed   By: Abigail Miyamoto M.D.   On: 05/16/2015 14:48   Ct Abdomen Pelvis W Contrast  05/01/2015  CLINICAL DATA:  Pt presents from home via EMS c/o diarrhea x 2 weeks with occasional bright red appearance and increased gas. Pt is legally blind and therefore is unsure of whether or not she saw blood in her stool. Hx of insulin-dependent diabetes II and diverticulitis EXAM: CT ABDOMEN AND PELVIS WITH CONTRAST TECHNIQUE: Multidetector CT imaging of the abdomen and pelvis was performed using the standard protocol following bolus administration of intravenous contrast. CONTRAST:  168mL OMNIPAQUE IOHEXOL 300 MG/ML  SOLN COMPARISON:  CT, 04/24/2012 FINDINGS: Lung bases: Vascular and interstitial prominence. No overt pulmonary edema or evidence of pneumonia. Heart normal in size. Liver and spleen:  Unremarkable. Gallbladder surgically absent. Mild prominence of the intern extrahepatic biliary tree, stable. Pancreas:  Normal. Adrenal glands:  No masses. Kidneys, ureters, bladder: Tiny low-density lesion arises from the anterior lower pole of the left kidney, likely a cyst. This is stable. No  other renal masses or lesions. No stones. No hydronephrosis. Ureters normal course and in caliber. Bladder is unremarkable. Uterus and adnexa:  Uterus surgically absent.  No pelvic masses. Lymph nodes:  No adenopathy. Ascites:  None. Vascular: Atherosclerotic changes are noted throughout the abdominal aorta and at the origin of several branch vessels. This is stable. Gastrointestinal: Several scattered colonic diverticula. No diverticulitis. No colon wall thickening or inflammatory changes. No bowel dilation is seen to suggest obstruction or generalized adynamic ileus. Small bowel is unremarkable. Stomach is unremarkable. Musculoskeletal: Bones demineralized. Degenerative changes noted of the lumbar spine. No osteoblastic osteolytic lesions. IMPRESSION: 1. No acute findings. 2. There are scattered colonic diverticula, but no evidence diverticulitis. There are no findings to explain GI bleeding. 3. Chronic findings include changes from a cholecystectomy and hysterectomy and atherosclerotic changes throughout the abdominal aorta and its branch vessels. Electronically Signed   By: Lajean Manes M.D.   On: 05/01/2015 23:26    My personal review of EKG: NSR, No ST changes noted.   Assessment & Plan  Principal Problem:   Sepsis ([place]) Active Problems:   HCAP (healthcare-associated pneumonia)  Pure hypercholesterolemia   Legal blindness   BOOP (bronchiolitis obliterans with organizing pneumonia) ([place])   Diabetes mellitus ([place])   Fibromyalgia   HAP (hospital-acquired pneumonia)   Sepsis Secondary to HCAP. Blood cultures and urine cultures are pending. Antibiotics have been started in the ER. We will give her a liter of fluid and then place her on normal saline at 100 mL per hour. Cycle lactic acid.  HCAP Placed on vancomycin and Zosyn. Would narrow antibiotics as soon as appropriate.  Urine for Legionella and strep pneumo antigen are pending. We'll check influenza panel. Will provide supportive care  with Robitussin, Mucinex, nebulizers, PRN oxygen.  Incentive spirometry  Diabetes mellitus She has allergies to Lantus and NovoLog. Will continue Humalog regimen here in the hospital.  Scleroderma with pulmonary infiltration, as well as Reynolds and Sjogren's syndrome She is on prednisone 5 mg daily. We will double that dose while inpatient.  Chronic neck pain Hx of multiple neck surgeries.  Stable.  Hypothyroidism Continue synthroid.  Hypertension Holding maxide due to soft BP.    Consultants Called:    None  Family Communication:    Patient is alert, orientated and understands their plan of care.  Code Status:    Full code but no long term life support.  Condition:    Guarded.  Potential Disposition:   To home when appropriate in apprx 4 days.  Time spent in minutes : [place],  [place] on 05/16/2015 at 3:49 PM Between 7am to 7pm - Pager - [phone] After 7pm go to [email or web address] - password [email or web address] And look for the night coverage person covering me after hours

## 2015-05-16 NOTE — Progress Notes (Signed)
ANTIBIOTIC CONSULT NOTE - INITIAL  Pharmacy Consult for Vancomycin / Zosyn Indication: pneumonia  Allergies  Allergen Reactions  . Lantus [Insulin Glargine]     Patient states "sulfate binder causes sinus infection/pneumonia".  Mack Hook [Levofloxacin In D5w] Other (See Comments)    Muscle aches  . Maxidex [Dexamethasone]     Insomnia, anxiety, dizziness  . Metoprolol-Hydrochlorothiazide     REACTION: decreased bp  . Norvasc [Amlodipine]     BIL Ankle edema  . Novolog [Insulin Aspart]     Patient states "sulfate binder causes sinus infection/pneumonia". Tolerates Humalog.  . Sulfonamide Derivatives Hives  . Titanium     Cervical plate - had to change for stainless steel  . Adhesive [Tape] Hives and Rash    Patient Measurements: 62 kg   Labs:  Recent Labs  05/16/15 1340  WBC 18.6*  HGB 13.4  PLT 177  CREATININE 0.90    Medical History: Past Medical History  Diagnosis Date  . Coronary artery disease   . 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 Hypertension   . Dyslipidemia   . Diabetes mellitus   . CVA (cerebral infarction) 1991  . Macular degeneration   . GERD (gastroesophageal reflux disease)   . Diverticulosis   . IBS (irritable bowel syndrome)   . Zenker's diverticulum   . Emphysema   . Pulmonary hypertension ([place])   . Chronic sinusitis   . Fibromyalgia   . Hypothyroidism   . Sjogren's disease ([place])   . Raynaud's phenomenon   . Cervical disc disease   . Adenomatous colon polyp   . Scleroderma ([place])   . COPD (chronic obstructive pulmonary disease) ([place])   . Arthritis   . Blood transfusion   . Myocardial infarction [facility]) may 27th 2007  . Depression   . Stroke [facility]) 1990    brain stem stroke - weakness rt hand  . Cancer [facility])     cervical cancer pre hysterectomy  . Sleep apnea     STOP BANG SCORE 5  . Legally blind   . CONSTIPATION, CHRONIC 06/26/2007    Qualifier: Diagnosis of  By: Nils Pyle CMA ([place]), Mearl Latin    . Pneumonia 07/16/2013     HX  PNEUMONIA  . Pneumonia   . Bronchitis     Assessment: 81 year old female to begin Vancomycin and Zosyn for pneumonia  Goal of Therapy:  Vancomycin trough level 15-20 mcg/ml  Appropriate Zosyn dosing  Plan:  Zosyn 3.375 grams iv Q 8 hours - 4 hr infusion Vancomycin 1 gram iv Q 24 hours Follow up Scr, cultures, fever trend, progress  Thank you Anette Guarneri, PharmD [phone]  05/16/2015,3:48 PM

## 2015-05-16 NOTE — ED Notes (Addendum)
Received pt from home with c/o woke up about 0600 with low blood sugar, lightheaded, dizziness, fatigue, back pain, chest pain, weakness. Pt did not check her blood sugar at onset of symptoms, but did eat some peanut butter and 1/2 muffin. CBG for EMS 287. Pt temp 100.6 for EMS and given 1000 mg tylenol by EMS.

## 2015-05-17 DIAGNOSIS — K219 Gastro-esophageal reflux disease without esophagitis: Secondary | ICD-10-CM

## 2015-05-17 DIAGNOSIS — R5381 Other malaise: Secondary | ICD-10-CM

## 2015-05-17 DIAGNOSIS — A403 Sepsis due to Streptococcus pneumoniae: Secondary | ICD-10-CM

## 2015-05-17 LAB — BASIC METABOLIC PANEL
Anion gap: 6 (ref 5–15)
BUN: 18 mg/dL (ref 6–20)
CO2: 27 mmol/L (ref 22–32)
Calcium: 8 mg/dL — ABNORMAL LOW (ref 8.9–10.3)
Chloride: 100 mmol/L — ABNORMAL LOW (ref 101–111)
Creatinine, Ser: 0.74 mg/dL (ref 0.44–1.00)
GFR calc Af Amer: >60 mL/min (ref >60)
GFR calc non Af Amer: >60 mL/min (ref >60)
Glucose, Bld: 119 mg/dL — ABNORMAL HIGH (ref 65–99)
Potassium: 3.8 mmol/L (ref 3.5–5.1)
Sodium: 133 mmol/L — ABNORMAL LOW (ref 135–145)

## 2015-05-17 LAB — CBC
HCT: 37.1 % (ref 36.0–46.0)
Hemoglobin: 11.9 g/dL — ABNORMAL LOW (ref 12.0–15.0)
MCH: 27.5 pg (ref 26.0–34.0)
MCHC: 32.1 g/dL (ref 30.0–36.0)
MCV: 85.7 fL (ref 78.0–100.0)
Platelets: 156 10*3/uL (ref 150–400)
RBC: 4.33 MIL/uL (ref 3.87–5.11)
RDW: 16.2 % — ABNORMAL HIGH (ref 11.5–15.5)
WBC: 15.3 10*3/uL — ABNORMAL HIGH (ref 4.0–10.5)

## 2015-05-17 LAB — STREP PNEUMONIAE URINARY ANTIGEN: Strep Pneumo Urinary Antigen: NEGATIVE

## 2015-05-17 LAB — GLUCOSE, CAPILLARY
Glucose-Capillary: 127 mg/dL — ABNORMAL HIGH (ref 65–99)
Glucose-Capillary: 201 mg/dL — ABNORMAL HIGH (ref 65–99)
Glucose-Capillary: 304 mg/dL — ABNORMAL HIGH (ref 65–99)
Glucose-Capillary: 213 mg/dL — ABNORMAL HIGH (ref 65–99)

## 2015-05-17 MED ORDER — ENOXAPARIN SODIUM 40 MG/0.4ML ~~LOC~~ SOLN
40.0000 mg | SUBCUTANEOUS | Status: DC
  Administered 2015-05-17 – 2015-05-18 (×2): 40 mg via SUBCUTANEOUS
  Filled 2015-05-17 (×2): qty 0.4

## 2015-05-17 MED ORDER — INSULIN LISPRO 100 UNIT/ML ~~LOC~~ SOLN: 0.0000 [IU] | Freq: Three times a day (TID) | SUBCUTANEOUS | Status: DC

## 2015-05-17 MED ORDER — BUDESONIDE 0.25 MG/2ML IN SUSP
0.2500 mg | Freq: Two times a day (BID) | RESPIRATORY_TRACT | Status: DC
  Administered 2015-05-17 – 2015-05-19 (×5): 0.25 mg via RESPIRATORY_TRACT
  Filled 2015-05-17 (×5): qty 2

## 2015-05-17 NOTE — Evaluation (Signed)
Occupational Therapy Evaluation Patient Details Name: [name] MRN: [id number] DOB: [birth year] Today's Date: 05/17/2015    History of Present Illness [name] is a 81 y.o. female, with scleroderma with CREST syndrome, diabetes mellitus and obstructive sleep apnea,, and hypothyroidism. She presents to the emergency department with fatigue and malaise. Pt found to have R middle lobe pneumonia on chest x-ray. Pt also legally blind.   Clinical Impression   PT admitted with the above stated reasons.. Pt currently with functional limitiations due to the deficits listed below (see OT problem list). PTA was independent caring for 3 small children ( 9 mo 54 year and 40 year old) Pt will benefit from skilled OT to increase their independence and safety with adls and balance to allow discharge SNF . Pt provided handout that was enlarged and read aloud with discussion regarding energy conservation. Pt repeating statements back with teach back to ensure listener was understanding information.     Follow Up Recommendations  SNF    Equipment Recommendations  None recommended by OT    Recommendations for Other Services       Precautions / Restrictions Precautions Precautions: Other (comment) Precaution Comments: severe SOB with mobility/activity Restrictions Weight Bearing Restrictions: No      Mobility Bed Mobility Overal bed mobility: Needs Assistance Bed Mobility: Supine to Sit     Supine to sit: Modified independent (Device/Increase time)     General bed mobility comments: HOB flat and able to use rail to sit EOB  Transfers Overall transfer level: Needs assistance Equipment used: None Transfers: Sit to/from Stand Sit to Stand: Min guard         General transfer comment: good use of hands, increased time    Balance Overall balance assessment: No apparent balance deficits (not formally assessed)                                           ADL Overall ADL's : Needs assistance/impaired Eating/Feeding: Independent                                           Vision     Perception     Praxis      Pertinent Vitals/Pain Pain Assessment: Faces Faces Pain Scale: Hurts even more Pain Location: buttock from sitting in chair. Pt states " they told me it is red from being on that stretcher yesterday"     Hand Dominance Right   Extremity/Trunk Assessment Upper Extremity Assessment Upper Extremity Assessment: Overall WFL for tasks assessed   Lower Extremity Assessment Lower Extremity Assessment: Defer to PT evaluation   Cervical / Trunk Assessment Cervical / Trunk Assessment: Normal   Communication Communication Communication: No difficulties   Cognition Arousal/Alertness: Awake/alert Behavior During Therapy: WFL for tasks assessed/performed Overall Cognitive Status: Within Functional Limits for tasks assessed                     General Comments       Exercises       Shoulder Instructions      Home Living Family/patient expects to be discharged to:: Private residence Living Arrangements: Alone Available Help at Discharge: Family;Friend(s) (limited PRN, lost son 1 year ago) Type of Home: House   Entrance Stairs-Number of Steps: 3  Entrance Stairs-Rails: Right Home Layout: Two level;Able to live on main level with bedroom/bathroom Alternate Level Stairs-Number of Steps: flight   Bathroom Shower/Tub: Occupational psychologist: Standard     Home Equipment: Environmental consultant - 4 wheels;Grab bars - tub/shower;Shower seat   Additional Comments: only goes upstairs to clean and flush toliets once a week      Prior Functioning/Environment Level of Independence: Independent        Comments: independent with ADLs but doesn't drive so requires a ride to grocery store    OT Diagnosis: Generalized weakness   OT Problem List: Decreased strength;Decreased activity tolerance   OT  Treatment/Interventions: Self-care/ADL training;Energy conservation;Therapeutic activities;Balance training    OT Goals(Current goals can be found in the care plan section) Acute Rehab OT Goals Patient Stated Goal: to watch my great grand children OT Goal Formulation: With patient Time For Goal Achievement: 05/31/15 Potential to Achieve Goals: Good  OT Frequency: Min 2X/week   Barriers to D/C:            Co-evaluation              End of Session Equipment Utilized During Treatment: Gait belt Nurse Communication: Mobility status;Precautions  Activity Tolerance: Other (comment) (SOB ) Patient left: in bed;with call bell/phone within reach   Time: 1200-1253 OT Time Calculation (min): 53 min Charges:  OT General Charges $OT Visit: 1 Procedure OT Evaluation $Initial OT Evaluation Tier I: 1 Procedure OT Treatments $Self Care/Home Management : 23-37 mins G-Codes:    Peri Maris 2015/05/27, 1:05 PM   Jeri Modena   OTR/L Pager: [phone] Office: [phone] .

## 2015-05-17 NOTE — Evaluation (Signed)
Clinical/Bedside Swallow Evaluation Patient Details  Name: [name] MRN: [id number] Date of Birth: [birth year]  Today's Date: 05/17/2015 Time: SLP Start Time (ACUTE ONLY): 19 SLP Stop Time (ACUTE ONLY): 0930 SLP Time Calculation (min) (ACUTE ONLY): 15 min  Past Medical History:  Past Medical History  Diagnosis Date  . Coronary artery disease   . 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 Hypertension   . Dyslipidemia   . Diabetes mellitus   . CVA (cerebral infarction) 1991  . Macular degeneration   . GERD (gastroesophageal reflux disease)   . Diverticulosis   . IBS (irritable bowel syndrome)   . Zenker's diverticulum   . Emphysema   . Pulmonary hypertension ([place])   . Chronic sinusitis   . Fibromyalgia   . Hypothyroidism   . Sjogren's disease ([place])   . Raynaud's phenomenon   . Cervical disc disease   . Adenomatous colon polyp   . Scleroderma ([place])   . COPD (chronic obstructive pulmonary disease) ([place])   . Arthritis   . Blood transfusion   . Myocardial infarction [facility]) may 27th 2007  . Depression   . Stroke [facility]) 1990    brain stem stroke - weakness rt hand  . Cancer [facility])     cervical cancer pre hysterectomy  . Sleep apnea     STOP BANG SCORE 5  . Legally blind   . CONSTIPATION, CHRONIC 06/26/2007    Qualifier: Diagnosis of  By: Nils Pyle CMA ([place]), Mearl Latin    . Pneumonia 07/16/2013     HX PNEUMONIA  . Pneumonia   . Bronchitis    Past Surgical History:  Past Surgical History  Procedure Laterality Date  . Total abdominal hysterectomy  1973  . Bilateral oophorectomy  2002  . Spinal fusion  1997    and implantation of a plate   . Lobe thyroid removal    . Thyroid lobectomy  1991    removal of left lobe thyroid  . Cholecystectomy  1965  . Appendectomy    . Cataract extraction    . Bronchoscopy  02-22-09  . Stented cardiac  artery  2007 / 2007 / 2010    X 3 STENT  . Proctoscopy  03/18/2012    Procedure: PROCTOSCOPY;  Surgeon: Adin Hector, MD;  Location: [facility]  ORS;  Service: General;  Laterality: N/A;  rigid procto  . Colon surgery  2014    colon resection  . Esophagogastroduodenoscopy (egd) with esophageal dilation N/A 06/18/2013    Procedure: ESOPHAGOGASTRODUODENOSCOPY (EGD) WITH ESOPHAGEAL DILATION;  Surgeon: Inda Castle, MD;  Location: [facility];  Service: Endoscopy;  Laterality: N/A;  . Cardiac catheterization      x 3  . Colonectomy    . Radiology with anesthesia N/A 03/09/2015    Procedure: MRI CERVICAL SPINE WITHOUT AND LUMBER WITHOUT;  Surgeon: Medication Radiologist, MD;  Location: [facility];  Service: Radiology;  Laterality: N/A;   HPI:  [name] is a 81 y.o. female, with scleroderma with CREST syndrome, diabetes mellitus and obstructive sleep apnea,, and hypothyroidism. She presents to the emergency department with fatigue and malaise. Pt found to have R middle lobe pneumonia on chest x-ray. Pt also legally blind. Pt has a h/o a primary esophageal dysphagia with Zenker's diverticulum and pt s/p esophageal perforation during surgery in 1997. MBS in 2015 recommended use of head turn left and liquid washes to facilitate pharyngeal clearance and clearance of diverticulum.    Assessment / Plan / Recommendation Clinical Impression  Pt has  a baseline esophageal dysphagia with mild impact on pharyngeal function per MBS in 2015. Pt recalls recommended strategies and utilizes them with Min cues. Head turn left and small sips eliminate immediate coughing with thin liquids during SLP osbervation. Recommend mechanical soft diet per pt's preference and thin liquids, with use of head turn and liquid washes. SLP to follow for tolerance and utilization of strategies to reduce aspiration risk.    Aspiration Risk  Mild aspiration risk    Diet Recommendation  Dysphagia 3 (mechanical soft), thin liquids Head turn left while swallowing, small sips Alternate solids/liquids   Medication Administration: Crushed with puree    Other  Recommendations  Oral Care Recommendations: Oral care BID   Follow up Recommendations  Other (comment) (tba - none anticipated)    Frequency and Duration min 2x/week  1 week       Swallow Study   General HPI: [name] is a 81 y.o. female, with scleroderma with CREST syndrome, diabetes mellitus and obstructive sleep apnea,, and hypothyroidism. She presents to the emergency department with fatigue and malaise. Pt found to have R middle lobe pneumonia on chest x-ray. Pt also legally blind. Pt has a h/o a primary esophageal dysphagia with Zenker's diverticulum and pt s/p esophageal perforation during surgery in 1997. MBS in 2015 recommended use of head turn left and liquid washes to facilitate pharyngeal clearance and clearance of diverticulum.  Type of Study: Bedside Swallow Evaluation Previous Swallow Assessment: see HPI Diet Prior to this Study: Regular;Thin liquids Temperature Spikes Noted: Yes (99.5) Respiratory Status: Nasal cannula History of Recent Intubation: No Behavior/Cognition: Alert;Cooperative;Pleasant mood Oral Cavity Assessment: Dry Oral Care Completed by SLP: No Oral Cavity - Dentition: Adequate natural dentition Vision: Functional for self-feeding (legally blind, but feeds herself with set-up assist) Self-Feeding Abilities: Able to feed self Patient Positioning: Upright in chair Baseline Vocal Quality: Normal Volitional Cough: Strong    Oral/Motor/Sensory Function Overall Oral Motor/Sensory Function: Within functional limits   Ice Chips Ice chips: Not tested   Thin Liquid Thin Liquid: Impaired Presentation: Self Fed;Straw Pharyngeal  Phase Impairments: Suspected delayed Swallow;Cough - Immediate    Nectar Thick Nectar Thick Liquid: Not tested   Honey Thick Honey Thick Liquid: Not tested   Puree Puree: Impaired Presentation: Self Fed;Spoon Pharyngeal Phase Impairments: Multiple swallows   Solid Solid: Impaired Presentation: Self Fed Pharyngeal Phase Impairments: Multiple  swallows      Germain Osgood, M.A. CCC-SLP [phone]  Germain Osgood 05/17/2015,10:09 AM

## 2015-05-17 NOTE — Progress Notes (Signed)
Patient arrived on unit via stretcher. Patient alert and oriented x4. Patient is legally blind in both eyes. Patient placed on telemetry, [place] notified. Patient's skin assessment completed with two RNs, bruising on both arms and redness on sacrum noted. Patient's IV clean, dry and infusing. Patient placed on 2L of oxygen. Patient denies pain. Orders have been reviewed and implemented. Call light has been placed within reach and bed alarm has been activated. RN will continue to monitor the patient.  Nena Polio BSN, RN  Phone Number: [phone]

## 2015-05-17 NOTE — Evaluation (Signed)
Physical Therapy Evaluation Patient Details Name: [name] MRN: [id number] DOB: 08-Aug-1930 Today's Date: 05/17/2015   History of Present Illness  [name] is a 81 y.o. female, with scleroderma with CREST syndrome, diabetes mellitus and obstructive sleep apnea,, and hypothyroidism. She presents to the emergency department with fatigue and malaise. Pt found to have R middle lobe pneumonia on chest x-ray. Pt also legally blind.  Clinical Impression  Pt mobility greatly limited by SOB and decreased SpO2 on room air. Pt indep and watch 3 grandchildren (55mo, 3yo and 36 yo) PTA. Pt currently at this time unable to care for herself safely as she doesn't have the endurance or activity tolerance to complete cooking, cleaning, bathing, and dressing. If pt's SOB improves over the next couple of days she may be able to return home with intermittent supervision.    Follow Up Recommendations SNF;Supervision/Assistance - 24 hour (pt prefers [facility])    Equipment Recommendations  None recommended by PT    Recommendations for Other Services       Precautions / Restrictions Precautions Precautions: Other (comment) Precaution Comments: severe SOB with mobility/activity Restrictions Weight Bearing Restrictions: No      Mobility  Bed Mobility Overal bed mobility: Needs Assistance Bed Mobility: Supine to Sit     Supine to sit: Supervision     General bed mobility comments: HOB elevated, used UEs to pull on bed rails and scoot to EOB  Transfers Overall transfer level: Needs assistance Equipment used: None Transfers: Sit to/from Stand Sit to Stand: Min guard         General transfer comment: good use of hands, increased time  Ambulation/Gait Ambulation/Gait assistance: Min guard Ambulation Distance (Feet): 60 Feet Assistive device: None (pushed vitals machine 20') Gait Pattern/deviations: Step-through pattern;Decreased stride length Gait velocity: slow, guarded    General Gait Details: no episodes of LOB however frequent stops due to +SOB, pt SpO2 would drop to 78-88 during walking on RA but would immediatley go back up with standing rest break into 90s.  Stairs            Wheelchair Mobility    Modified Rankin (Stroke Patients Only)       Balance Overall balance assessment: No apparent balance deficits (not formally assessed)                                           Pertinent Vitals/Pain Pain Assessment: No/denies pain    Home Living Family/patient expects to be discharged to:: Private residence Living Arrangements: Alone Available Help at Discharge: Family;Friend(s) (limited PRN, lost son 1 year ago) Type of Home: House   Entrance Stairs-Rails: Right Entrance Stairs-Number of Steps: 3 Home Layout: Two level;Able to live on main level with bedroom/bathroom Home Equipment: Walker - 4 wheels;Grab bars - tub/shower;Shower seat Additional Comments: only goes upstairs to clean and flush toliets once a week    Prior Function Level of Independence: Independent         Comments: independent with ADLs but doesn't drive so requires a ride to grocery store     Hand Dominance   Dominant Hand: Right    Extremity/Trunk Assessment   Upper Extremity Assessment: Overall WFL for tasks assessed           Lower Extremity Assessment: Overall WFL for tasks assessed      Cervical / Trunk Assessment: Normal  Communication  Communication: No difficulties  Cognition Arousal/Alertness: Awake/alert Behavior During Therapy: WFL for tasks assessed/performed Overall Cognitive Status: Within Functional Limits for tasks assessed                      General Comments General comments (skin integrity, edema, etc.): greatly limited by SOB    Exercises        Assessment/Plan    PT Assessment Patient needs continued PT services  PT Diagnosis Generalized weakness   PT Problem List Decreased  strength;Decreased activity tolerance;Decreased mobility;Cardiopulmonary status limiting activity  PT Treatment Interventions DME instruction;Gait training;Stair training;Functional mobility training;Therapeutic activities;Therapeutic exercise;Balance training   PT Goals (Current goals can be found in the Care Plan section) Acute Rehab PT Goals Patient Stated Goal: breath better PT Goal Formulation: With patient Time For Goal Achievement: 05/24/15 Potential to Achieve Goals: Good Additional Goals Additional Goal #1: Pt to demo indep with utilizing energy conservation techniques.    Frequency Min 3X/week   Barriers to discharge Decreased caregiver support lives alone    Co-evaluation               End of Session Equipment Utilized During Treatment: Gait belt Activity Tolerance:  (limited by SOB) Patient left: in chair;with call bell/phone within reach (with speech) Nurse Communication: Mobility status (SOB)         [id number] PT Time Calculation (min) (ACUTE ONLY): 27 min   Charges:   PT Evaluation $Initial PT Evaluation Tier I: 1 Procedure PT Treatments $Gait Training: 8-22 mins   PT G CodesKingsley [name] 05/17/2015, 9:55 AM   [name], PT, DPT Pager #: [phone] Office #: [phone]

## 2015-05-17 NOTE — Progress Notes (Signed)
[facility] PROGRESS NOTE  [name] [id number] DOB: 22-Dec-1930 DOA: 05/16/2015 PCP: Merrilee Seashore, MD  Assessment/Plan: Sepsis -Secondary to HCAP. Blood cultures and urine cultures are pending. Antibiotics have been started in the ER.  -latic acid resolved -will follow sepsis features  HCAP -started on Vancomycin and Zosyn - continue supportive care and follow clinical response  with Robitussin, Mucinex, nebulizers, PRN oxygen.  -continue flutter valve and Incentive spirometry  Diabetes mellitus -She has allergies to Lantus and NovoLog.  -Will continue Humalog SSI regimen here in the hospital.  Scleroderma with pulmonary infiltration, as well as Reynolds and Sjogren's syndrome -continue prednisone; but dose increase to 10mg  daily  Chronic neck pain -Hx of multiple neck surgeries.  -Stable overall -continue home analgesic regimen PRN  Hypothyroidism -Continue synthroid.  Hypertension -BP is soft, but stable -will continue Holding maxide due to soft BP.  Code Status: Full Family Communication: no family at bedside  Disposition Plan: to be determine; remains inpatient; continue IV antibiotics. Per PT/OT evaluation will benefit of ST SNF at discharge.   Consultants:  None   Procedures:  See below for x-ray reports   Antibiotics:  Vancomycin  11/20  Zosyn  11/20  HPI/Subjective: Still complaining of SOB and feeling tired. No CP, no fever.  Objective: Filed Vitals:   05/17/15 0507 05/17/15 0925  BP: 118/49 120/40  Pulse: 80 79  Temp: 98.4 F (36.9 C) 98 F (36.7 C)  Resp: 18 18    Intake/Output Summary (Last 24 hours) at 05/17/15 1428 Last data filed at 05/17/15 0900  Gross per 24 hour  Intake   2610 ml  Output    350 ml  Net   2260 ml   Filed Weights   05/16/15 2017  Weight: 62.4 kg (137 lb 9.1 oz)    Exam:   General:  Afebrile, no CP; still complaining of some SOB and feeling weak.  Cardiovascular: S1 and S2,  no rubs or gallops  Respiratory: no frank crackles, mild exp wheezing and scattered rhonchi. Fair air movement. Good O2 sat on 3L  Abdomen: soft, NT, ND, positive BS  Musculoskeletal: no edema or cyanosis   Data Reviewed: Basic Metabolic Panel:  Recent Labs Lab 05/16/15 1340 05/17/15 0600  NA 129* 133*  K 4.3 3.8  CL 92* 100*  CO2 25 27  GLUCOSE 264* 119*  BUN 26* 18  CREATININE 0.90 0.74  CALCIUM 8.7* 8.0*   CBC:  Recent Labs Lab 05/16/15 1340 05/17/15 0600  WBC 18.6* 15.3*  HGB 13.4 11.9*  HCT 40.3 37.1  MCV 84.5 85.7  PLT 177 156   ProBNP (last 3 results)  Recent Labs  01/18/15 1542 01/29/15 1553 02/02/15 1156  PROBNP 75.0 40.0 150.0*    CBG:  Recent Labs Lab 05/16/15 1443 05/16/15 2026 05/17/15 0758 05/17/15 1121  GLUCAP 239* 340* 127* 201*    Recent Results (from the past 240 hour(s))  Blood culture (routine x 2)     Status: None (Preliminary result)   Collection Time: 05/16/15  3:18 PM  Result Value Ref Range Status   Specimen Description BLOOD LEFT HAND  Final   Special Requests BOTTLES DRAWN AEROBIC AND ANAEROBIC 5CC  Final   Culture NO GROWTH < 24 HOURS  Final   Report Status PENDING  Incomplete  Blood culture (routine x 2)     Status: None (Preliminary result)   Collection Time: 05/16/15  3:30 PM  Result Value Ref Range Status   Specimen Description BLOOD LEFT ANTECUBITAL  Final   Special Requests BOTTLES DRAWN AEROBIC AND ANAEROBIC 10CC  Final   Culture NO GROWTH < 24 HOURS  Final   Report Status PENDING  Incomplete     Studies: Dg Chest 2 View  05/16/2015  CLINICAL DATA:  Pt c/o sharp pain between her shoulder blades, fever, clammy skin, dizziness, nausea, and SOB since yesterday morning. Hx of CAD, HTN, DM, GERD, emphysema, pneumonia, COPD, MI, and bronchitis. Pt is an ex-smoker. EXAM: CHEST  2 VIEW COMPARISON:  01/29/2015 FINDINGS: Surgical changes about the low left neck. Midline trachea. Normal heart size. Atherosclerosis in  the transverse aorta. No pleural effusion or pneumothorax. Interstitial thickening is chronic. Superimposed patchy opacity within the right mid and lower lung zone on the frontal radiograph. This may correspond increased density anteriorly on the lateral view. There is also an area of right upper lobe pulmonary opacity laterally which has a somewhat nodular component. IMPRESSION: increased right-sided interstitial and airspace opacities, suspicious for right middle lobe pneumonia superimposed upon COPD/ chronic bronchitis. A right upper lobe opacity is also favored to be infectious but is somewhat nodular and new since 01/29/2015. Recommend appropriate antibiotic therapy and radiographic follow-up in 1-2 weeks. If this area of right upper lobe nodularity is persistent, chest CT would be recommended. Atherosclerosis in the transverse aorta. Electronically Signed   By: Abigail Miyamoto M.D.   On: 05/16/2015 14:48    Scheduled Meds: . antiseptic oral rinse  7 mL Mouth Rinse BID  . aspirin EC  81 mg Oral Daily  . budesonide (PULMICORT) nebulizer solution  0.25 mg Nebulization BID  . enoxaparin (LOVENOX) injection  40 mg Subcutaneous Q24H  . famotidine  20 mg Oral Daily  . fluticasone  1 spray Each Nare Daily  . guaiFENesin  1,200 mg Oral BID  . imipramine  75 mg Oral QHS  . insulin lispro  0-9 Units Subcutaneous TID WC  . insulin lispro  4 Units Subcutaneous q1800  . insulin lispro  6 Units Subcutaneous BID WC  . levothyroxine  100 mcg Oral QAC breakfast  . loratadine  10 mg Oral Daily  . nystatin  5 mL Oral QID  . piperacillin-tazobactam (ZOSYN)  IV  3.375 g Intravenous Q8H  . predniSONE  10 mg Oral Q breakfast  . saccharomyces boulardii  250 mg Oral BID  . sodium chloride  3 mL Intravenous Q12H  . vancomycin  1,000 mg Intravenous Q24H   Continuous Infusions: . sodium chloride 60 mL/hr at 05/17/15 0800    Principal Problem:   Sepsis ([place]) Active Problems:   Pure hypercholesterolemia   Legal  blindness   BOOP (bronchiolitis obliterans with organizing pneumonia) ([place])   Diabetes mellitus ([place])   Fibromyalgia   HAP (hospital-acquired pneumonia)   HCAP (healthcare-associated pneumonia)    Time spent: 11 minutes    Barton Dubois  [facility] Pager [phone]. If 7PM-7AM, please contact night-coverage at [email or web address], password [facility] 05/17/2015, 2:28 PM  LOS: 1 day

## 2015-05-18 DIAGNOSIS — R7881 Bacteremia: Secondary | ICD-10-CM

## 2015-05-18 DIAGNOSIS — R1310 Dysphagia, unspecified: Secondary | ICD-10-CM

## 2015-05-18 LAB — GLUCOSE, CAPILLARY
Glucose-Capillary: 154 mg/dL — ABNORMAL HIGH (ref 65–99)
Glucose-Capillary: 194 mg/dL — ABNORMAL HIGH (ref 65–99)
Glucose-Capillary: 234 mg/dL — ABNORMAL HIGH (ref 65–99)
Glucose-Capillary: 186 mg/dL — ABNORMAL HIGH (ref 65–99)

## 2015-05-18 LAB — HEMOGLOBIN A1C
Hgb A1c MFr Bld: 7.9 % — ABNORMAL HIGH (ref 4.8–5.6)
Mean Plasma Glucose: 180 mg/dL

## 2015-05-18 LAB — LEGIONELLA PNEUMOPHILA SEROGP 1 UR AG: L. pneumophila Serogp 1 Ur Ag: NEGATIVE

## 2015-05-18 MED ORDER — INSULIN LISPRO 100 UNIT/ML (KWIKPEN)
0.0000 [IU] | PEN_INJECTOR | Freq: Three times a day (TID) | SUBCUTANEOUS | Status: DC
  Administered 2015-05-18: 6 [IU] via SUBCUTANEOUS
  Administered 2015-05-18: 4 [IU] via SUBCUTANEOUS

## 2015-05-18 NOTE — Progress Notes (Signed)
Speech Language Pathology Treatment: Dysphagia  Patient Details Name: [name] MRN: [id number] DOB: Jan 31, 1931 Today's Date: 05/18/2015 Time: [id number] SLP Time Calculation (min) (ACUTE ONLY): 9 min  Assessment / Plan / Recommendation Clinical Impression  Pt reports good tolerance of mechanical soft diet, and prefers it to regular textures. She needs Min cues from SLP for utilization of recommended head turn left and alternating solids/liquids. No overt signs of aspiration are observed. Recommend to continue with current recommendations.   HPI HPI: [name] is a 81 y.o. female, with scleroderma with CREST syndrome, diabetes mellitus and obstructive sleep apnea,, and hypothyroidism. She presents to the emergency department with fatigue and malaise. Pt found to have R middle lobe pneumonia on chest x-ray. Pt also legally blind. Pt has a h/o a primary esophageal dysphagia with Zenker's diverticulum and pt s/p esophageal perforation during surgery in 1997. MBS in 2015 recommended use of head turn left and liquid washes to facilitate pharyngeal clearance and clearance of diverticulum.       SLP Plan  Continue with current plan of care     Recommendations  Diet recommendations: Dysphagia 3 (mechanical soft);Thin liquid Liquids provided via: Cup;Straw Medication Administration: Crushed with puree Supervision: Patient able to self feed;Intermittent supervision to cue for compensatory strategies Compensations: Slow rate;Small sips/bites;Other (Comment) (head turn left) Postural Changes and/or Swallow Maneuvers: Seated upright 90 degrees;Upright 30-60 min after meal       Oral Care Recommendations: Oral care BID Follow up Recommendations: None Plan: Continue with current plan of care   Germain Osgood, M.A. CCC-SLP [phone]  Germain Osgood 05/18/2015, 12:19 PM

## 2015-05-18 NOTE — Progress Notes (Signed)
[facility] PROGRESS NOTE  [name] [id number] DOB: [birth year] DOA: 05/16/2015 PCP: Merrilee Seashore, MD  Assessment/Plan: Sepsis -Secondary to HCAP. Blood cultures and urine cultures are pending. Antibiotics have been started in the ER.  -latic acid resolved -will follow sepsis features (which are improving/resolved essentially)  HCAP -started on Vancomycin and Zosyn -continue supportive care and follow clinical response  with Robitussin, Mucinex, nebulizers, PRN oxygen.  -continue flutter valve and Incentive spirometry  Diabetes mellitus -She has allergies to Lantus and NovoLog.  -Will continue Humalog SSI regimen here in the hospital.  Scleroderma with pulmonary infiltration, as well as Reynolds and Sjogren's syndrome -continue prednisone; but dose increased to 10mg  daily  Chronic neck pain -Hx of multiple neck surgeries.  -Stable overall -continue home analgesic regimen PRN  Hypothyroidism -Continue synthroid.  Hypertension -BP is stable -will continue Holding maxide   Dysphagia -will follow SPL rec's -continue dysphagia 3 diet and thin liquids  Physical deconditioning  -PT/OT recommending SNF  GPC bacteremia -1/2 blood cx positive -most likely contaminant -covered with current antibiotic therapy -will follow speciation and sensitivity before making further decisions    Code Status: Full Family Communication: no family at bedside  Disposition Plan: to be determine; remains inpatient; continue IV antibiotics. Per PT/OT evaluation will benefit of ST SNF at discharge.   Consultants:  None   Procedures:  See below for x-ray reports   Antibiotics:  Vancomycin  11/20  Zosyn  11/20  HPI/Subjective: No CP, no fever. Non toxic. Patient reports improvement in her breathing and is not requiring O2 supplementation at rest. 1/2 blood cx's reported positive for GPC  Objective: Filed Vitals:   05/18/15 0500 05/18/15 1020  BP:  130/63 122/46  Pulse: 71 75  Temp: 98.1 F (36.7 C) 97.8 F (36.6 C)  Resp: 16 17    Intake/Output Summary (Last 24 hours) at 05/18/15 1237 Last data filed at 05/18/15 1021  Gross per 24 hour  Intake   1200 ml  Output   1150 ml  Net     50 ml   Filed Weights   05/16/15 2017 05/18/15 0221  Weight: 62.4 kg (137 lb 9.1 oz) 65.1 kg (143 lb 8.3 oz)    Exam:   General:  Afebrile, no CP; reports that her breathing is much better and in fact has good O2 sat at rest on RA. Non toxic appearance   Cardiovascular: S1 and S2, no rubs or gallops  Respiratory: no frank crackles, improve air movement, scattered rhonchi. No oxygen supplementation needed at rest.  Abdomen: soft, NT, ND, positive BS  Musculoskeletal: no edema or cyanosis   Data Reviewed: Basic Metabolic Panel:  Recent Labs Lab 05/16/15 1340 05/17/15 0600  NA 129* 133*  K 4.3 3.8  CL 92* 100*  CO2 25 27  GLUCOSE 264* 119*  BUN 26* 18  CREATININE 0.90 0.74  CALCIUM 8.7* 8.0*   CBC:  Recent Labs Lab 05/16/15 1340 05/17/15 0600  WBC 18.6* 15.3*  HGB 13.4 11.9*  HCT 40.3 37.1  MCV 84.5 85.7  PLT 177 156   ProBNP (last 3 results)  Recent Labs  01/18/15 1542 01/29/15 1553 02/02/15 1156  PROBNP 75.0 40.0 150.0*    CBG:  Recent Labs Lab 05/17/15 1121 05/17/15 1624 05/17/15 2043 05/18/15 0816 05/18/15 1147  GLUCAP 201* 304* 213* 154* 194*    Recent Results (from the past 240 hour(s))  Blood culture (routine x 2)     Status: None (Preliminary result)   Collection Time:  05/16/15  3:18 PM  Result Value Ref Range Status   Specimen Description BLOOD LEFT HAND  Final   Special Requests BOTTLES DRAWN AEROBIC AND ANAEROBIC 5CC  Final   Culture  Setup Time   Final    GRAM POSITIVE COCCI IN PAIRS ANAEROBIC BOTTLE ONLY CRITICAL RESULT CALLED TO, READ BACK BY AND VERIFIED WITH: J NARAMDAS,RN AT 1017 05/18/15 BY L BENFIELD    Culture GRAM POSITIVE COCCI  Final   Report Status PENDING  Incomplete   Blood culture (routine x 2)     Status: None (Preliminary result)   Collection Time: 05/16/15  3:30 PM  Result Value Ref Range Status   Specimen Description BLOOD LEFT ANTECUBITAL  Final   Special Requests BOTTLES DRAWN AEROBIC AND ANAEROBIC 10CC  Final   Culture NO GROWTH 2 DAYS  Final   Report Status PENDING  Incomplete     Studies: Dg Chest 2 View  05/16/2015  CLINICAL DATA:  Pt c/o sharp pain between her shoulder blades, fever, clammy skin, dizziness, nausea, and SOB since yesterday morning. Hx of CAD, HTN, DM, GERD, emphysema, pneumonia, COPD, MI, and bronchitis. Pt is an ex-smoker. EXAM: CHEST  2 VIEW COMPARISON:  01/29/2015 FINDINGS: Surgical changes about the low left neck. Midline trachea. Normal heart size. Atherosclerosis in the transverse aorta. No pleural effusion or pneumothorax. Interstitial thickening is chronic. Superimposed patchy opacity within the right mid and lower lung zone on the frontal radiograph. This may correspond increased density anteriorly on the lateral view. There is also an area of right upper lobe pulmonary opacity laterally which has a somewhat nodular component. IMPRESSION: increased right-sided interstitial and airspace opacities, suspicious for right middle lobe pneumonia superimposed upon COPD/ chronic bronchitis. A right upper lobe opacity is also favored to be infectious but is somewhat nodular and new since 01/29/2015. Recommend appropriate antibiotic therapy and radiographic follow-up in 1-2 weeks. If this area of right upper lobe nodularity is persistent, chest CT would be recommended. Atherosclerosis in the transverse aorta. Electronically Signed   By: Abigail Miyamoto M.D.   On: 05/16/2015 14:48    Scheduled Meds: . antiseptic oral rinse  7 mL Mouth Rinse BID  . aspirin EC  81 mg Oral Daily  . budesonide (PULMICORT) nebulizer solution  0.25 mg Nebulization BID  . enoxaparin (LOVENOX) injection  40 mg Subcutaneous Q24H  . famotidine  20 mg Oral Daily   . fluticasone  1 spray Each Nare Daily  . guaiFENesin  1,200 mg Oral BID  . imipramine  75 mg Oral QHS  . insulin lispro  4 Units Subcutaneous q1800  . insulin lispro  6 Units Subcutaneous BID WC  . insulin lispro  0-9 Units Subcutaneous TID WC  . levothyroxine  100 mcg Oral QAC breakfast  . loratadine  10 mg Oral Daily  . nystatin  5 mL Oral QID  . piperacillin-tazobactam (ZOSYN)  IV  3.375 g Intravenous Q8H  . predniSONE  10 mg Oral Q breakfast  . saccharomyces boulardii  250 mg Oral BID  . sodium chloride  3 mL Intravenous Q12H  . vancomycin  1,000 mg Intravenous Q24H   Continuous Infusions: . sodium chloride 60 mL/hr at 05/18/15 0547    Principal Problem:   Sepsis ([place]) Active Problems:   Pure hypercholesterolemia   Legal blindness   BOOP (bronchiolitis obliterans with organizing pneumonia) ([place])   Diabetes mellitus ([place])   Fibromyalgia   HAP (hospital-acquired pneumonia)   HCAP (healthcare-associated pneumonia)    Time  spent: 35 minutes   Barton Dubois  [facility] Pager [phone]. If 7PM-7AM, please contact night-coverage at [email or web address], password [facility] 05/18/2015, 12:37 PM  LOS: 2 days

## 2015-05-18 NOTE — Clinical Social Work Note (Cosign Needed)
Clinical Social Work Assessment  Patient Details  Name: [name] MRN: [id number] Date of Birth: June 20, 1931  Date of referral:  05/18/15               Reason for consult:  Facility Placement                Permission sought to share information with:  Facility Art therapist granted to share information::  Yes, Verbal Permission Granted  Name::     Kariana Sielski   Agency::     Relationship::  Daughter   Contact Information:  [phone]  Housing/Transportation Living arrangements for the past 2 months:  [place] of Information:  Patient, Adult Children Patient Interpreter Needed:  None Criminal Activity/Legal Involvement Pertinent to Current Situation/Hospitalization:  No - Comment as needed Significant Relationships:  Adult Children Jahmiya Mclain (daughter)) Lives with:  Self Do you feel safe going back to the place where you live?  Yes Need for family participation in patient care:  Yes (Comment)  Care giving concerns:  Not discussed.    Social Worker assessment / plan:    Ms. Luger daughter Lavesha Klehr) called CSW regarding her mother receiving a reccommendation for a skilled nursing facility after discharge. The patient's daughter asked for more clarity as to why her mother was being recommended to be discharged to a SNF. Terri stated that her mother (Ms. Steurer) was very independent and ambulatory at home prior to hospital admission.  Daughter also stated that she lived five minutes away from her mother, and already provided assistance/supervison when needed.     After explaining that the consult was for "short-term" rehabilatation and not long-term care, Terri asked for CSW to further explain to her mother to clear the patient's confusion. CSW visited patient in the room. Initially, Ms.Demke stated that she would like to be discharged to her home with supervision from her family. CSW explained short term rehab to  patient and emphasized  that the stay would be "short-term" and purpose is to help the patient gain her strength back.  After further explanation, both the patient and her daughter were agreeable to the patient being discharged to a skilled nursing facility when she was medically ready. The patient stated that she had been to [facility] after a previous procedure, and if possible would like to be placed in the facility if a bed was available at discharge. Patient questioned whether she would receive oxygen therapy at facility for her COPD.   No further questions or concerns were expressed at this time.      Employment status:  Retired Forensic scientist:  [employer] PT Recommendations:  [place], [place] / Referral to community resources:  [place]  Patient/Family's Response to care:  Not discussed.   Patient/Family's Understanding of and Emotional Response to Diagnosis, Current Treatment, and Prognosis:  Not discussed.   Emotional Assessment Appearance:  Appears stated age Attitude/Demeanor/Rapport:    Affect (typically observed):  Accepting, Appropriate, Calm, Hopeful Orientation:  Oriented to Self, Oriented to Place, Oriented to  Time, Oriented to Situation Alcohol / Substance use:  Tobacco Use (Patient reports that she quit smoking about 26 years ago. Her smoking use included cigarettes. Patient has a 40 pack-year smoking history.) Psych involvement (Current and /or in the community):  No (Comment)  Discharge Needs  Concerns to be addressed:  No discharge needs identified Readmission within the last 30 days:  Yes Current discharge risk:  None Barriers  to Discharge:  No Barriers Identified   Radonna Ricker, Student-SW 05/18/2015, 1:58 PM

## 2015-05-18 NOTE — NC FL2 (Signed)
[place] LEVEL OF CARE SCREENING TOOL     IDENTIFICATION  Patient Name: [name] Birthdate: 1930-12-26 Sex: female Admission Date (Current Location): 05/16/2015  [place] and [place] Number: [facility] and Address:  [facility], [place] [address] [place], [place] [address]      Provider Number: [id number]  Attending Physician Name and Address:  Barton Dubois, MD  Relative Name and Phone Number:  Ahlyssa Swisher [phone]    Current Level of Care: Hospital Recommended Level of Care: [place] Prior Approval Number:    Date Approved/Denied:   PASRR Number: [id number]  Discharge Plan: SNF    Current Diagnoses: Patient Active Problem List   Diagnosis Date Noted  . HAP (hospital-acquired pneumonia) 05/16/2015  . Sepsis ([place]) 05/16/2015  . HCAP (healthcare-associated pneumonia) 05/16/2015  . Diarrhea 05/02/2015  . Diarrhea with dehydration 05/02/2015  . Hyponatremia 05/02/2015  . Weakness 01/29/2015  . Pain in joint, ankle and foot 11/20/2014  . Acute bronchitis 09/01/2014  . CAP (community acquired pneumonia) 05/26/2014  . Dysphagia, unspecified(787.20) 06/18/2013  . Stricture and stenosis of esophagus 06/18/2013  . Urinary incontinence in female 05/06/2012  . Fibromyalgia   . Chronic confusional state 03/21/2012  . Postoperative anemia 03/21/2012  . Anemia of chronic disease 03/19/2012  . Diabetes mellitus ([place]) 05/30/2011  . Colonic stricture s/p lap sigmoid colectomy 03/18/2012 03/27/2011  . Diverticulosis of colon 03/27/2011  . Systemic sclerosis ([place]) 06/29/2009  . Pure hypercholesterolemia 05/25/2009  . BOOP (bronchiolitis obliterans with organizing pneumonia) ([place]) 02/18/2009  . Chronic rhinitis 07/08/2007  . EMPHYSEMA 07/08/2007  . Hyperlipidemia 06/26/2007  . Depression 06/26/2007  . Legal blindness 06/26/2007  . Essential hypertension 06/26/2007  . CORONARY ARTERY  DISEASE 06/26/2007  . ZENKER'S DIVERTICULUM 06/26/2007  . Irritable bowel syndrome 06/26/2007    Orientation ACTIVITIES/SOCIAL BLADDER RESPIRATION    Self, Time, Situation, Place  Active, Family supportive Continent O2 (As needed) (Patient is on 2L of oxygen )  BEHAVIORAL SYMPTOMS/MOOD NEUROLOGICAL BOWEL NUTRITION STATUS      Continent Diet (Diet DYS 3; thin fluids ; heart healthy, meds pureed. )  PHYSICIAN VISITS COMMUNICATION OF NEEDS Height & Weight Skin    Verbally   143 lbs. Normal          AMBULATORY STATUS RESPIRATION    Supervision limited O2 (As needed) (Patient is on 2L of oxygen )      Personal Care Assistance Level of Assistance  Bathing, Feeding, Dressing, Total care Bathing Assistance: Limited assistance Feeding assistance: Independent Dressing Assistance: Limited assistance Total Care Assistance: Limited assistance    Functional Limitations Info  Sight, Hearing, Speech, Contractures Sight Info: Impaired (Patient is legally blind) Hearing Info: Adequate Speech Info: Adequate Contractures Info: Adequate     SPECIAL CARE FACTORS FREQUENCY                      Additional Factors Info  Code Status, Allergies, Insulin Sliding Scale Code Status Info: CODE STATUS: FULL CODE Allergies Info: Lantus Insulin Glargine; Levaquin; Maxidex;  Metoprolol-Hydrochlorothiazide; Norvasc Amlodipine ; Novolog Insulin Aspart; Sulfonamide Derivatives; Titanium; Adhesive Tape    Insulin Sliding Scale Info: 0-9 Units, Subcutaneous, 3 times daily with meals        Current Medications (05/18/2015): Current Facility-Administered Medications  Medication Dose Route Frequency Provider Last Rate Last Dose  . 0.9 %  sodium chloride infusion   Intravenous Continuous Barton Dubois, MD 60 mL/hr at 05/18/15 0547    .  acetaminophen (TYLENOL) tablet 650 mg  650 mg Oral Q6H PRN Melton Alar, PA-C       Or  . acetaminophen (TYLENOL) suppository 650 mg  650 mg Rectal Q6H PRN  Melton Alar, PA-C      . albuterol (PROVENTIL) (2.5 MG/3ML) 0.083% nebulizer solution 2.5 mg  2.5 mg Nebulization Q2H PRN Melton Alar, PA-C      . alum & mag hydroxide-simeth (MAALOX/MYLANTA) 200-200-20 MG/5ML suspension 30 mL  30 mL Oral Q6H PRN Melton Alar, PA-C      . antiseptic oral rinse (CPC / CETYLPYRIDINIUM CHLORIDE 0.05%) solution 7 mL  7 mL Mouth Rinse BID Edwin Dada, MD   7 mL at 05/18/15 1000  . aspirin EC tablet 81 mg  81 mg Oral Daily Melton Alar, PA-C   81 mg at 05/18/15 1045  . budesonide (PULMICORT) nebulizer solution 0.25 mg  0.25 mg Nebulization BID Barton Dubois, MD   0.25 mg at 05/18/15 0831  . enoxaparin (LOVENOX) injection 40 mg  40 mg Subcutaneous Q24H Theone Murdoch Hammons, RPH   40 mg at 05/17/15 2122  . famotidine (PEPCID) tablet 20 mg  20 mg Oral Daily Melton Alar, PA-C   20 mg at 05/18/15 1045  . fluticasone (FLONASE) 50 MCG/ACT nasal spray 1 spray  1 spray Each Nare Daily Melton Alar, PA-C   1 spray at 05/18/15 1045  . guaiFENesin (MUCINEX) 12 hr tablet 1,200 mg  1,200 mg Oral BID Melton Alar, PA-C   1,200 mg at 05/18/15 1045  . guaiFENesin-dextromethorphan (ROBITUSSIN DM) 100-10 MG/5ML syrup 5 mL  5 mL Oral Q4H PRN Melton Alar, PA-C      . imipramine (TOFRANIL) tablet 75 mg  75 mg Oral QHS Melton Alar, PA-C   75 mg at 05/17/15 2122  . insulin lispro (HUMALOG) injection 4 Units  4 Units Subcutaneous q1800 Edwin Dada, MD   4 Units at 05/17/15 1800  . insulin lispro (HUMALOG) injection 6 Units  6 Units Subcutaneous BID WC Edwin Dada, MD   6 Units at 05/18/15 0800  . insulin lispro (HUMALOG) KwikPen 0-9 Units  0-9 Units Subcutaneous TID WC Barton Dubois, MD   6 Units at 05/18/15 1314  . levothyroxine (SYNTHROID, LEVOTHROID) tablet 100 mcg  100 mcg Oral QAC breakfast Melton Alar, PA-C   100 mcg at 05/18/15 0825  . loratadine (CLARITIN) tablet 10 mg  10 mg Oral Daily Melton Alar, PA-C   10 mg at  05/18/15 1045  . nystatin (MYCOSTATIN) 100000 UNIT/ML suspension 500,000 Units  5 mL Oral QID Melton Alar, PA-C   500,000 Units at 05/18/15 1500  . oxyCODONE (Oxy IR/ROXICODONE) immediate release tablet 5 mg  5 mg Oral Q4H PRN Melton Alar, PA-C      . piperacillin-tazobactam (ZOSYN) IVPB 3.375 g  3.375 g Intravenous Q8H Leonard Schwartz, MD   3.375 g at 05/18/15 0926  . predniSONE (DELTASONE) tablet 10 mg  10 mg Oral Q breakfast Melton Alar, PA-C   10 mg at 05/18/15 0825  . saccharomyces boulardii (FLORASTOR) capsule 250 mg  250 mg Oral BID Melton Alar, PA-C   250 mg at 05/18/15 1045  . senna-docusate (Senokot-S) tablet 1 tablet  1 tablet Oral QHS PRN Bobby Rumpf York, PA-C      . sodium chloride 0.9 % injection 3 mL  3 mL Intravenous Q12H Marianne L York, PA-C   3 mL  at 05/17/15 1026  . triazolam (HALCION) tablet 0.125-0.25 mg  0.125-0.25 mg Oral QHS PRN Melton Alar, PA-C   0.125 mg at 05/17/15 2122  . vancomycin (VANCOCIN) IVPB 1000 mg/200 mL premix  1,000 mg Intravenous Q24H Leonard Schwartz, MD 200 mL/hr at 05/17/15 1601 1,000 mg at 05/17/15 1601   Do not use this list as official medication orders. Please verify with discharge summary.  Discharge Medications:   Medication List    ASK your doctor about these medications        aspirin EC 81 MG tablet  Take 1 tablet (81 mg total) by mouth daily.     cetirizine 10 MG tablet  Commonly known as:  ZYRTEC  Take 10 mg by mouth daily after breakfast.     esomeprazole 40 MG capsule  Commonly known as:  NEXIUM  Take 1 capsule (40 mg total) by mouth daily before breakfast.     HUMALOG KWIKPEN 100 UNIT/ML KiwkPen  Generic drug:  insulin lispro  Inject 4-6 Units into the skin 3 (three) times daily with meals. Inject 6 units subcutaneously with breakfast and lunch, inject 4 units with supper     HYDROcodone-acetaminophen 5-325 MG tablet  Commonly known as:  NORCO/VICODIN  Take 0.5-1 tablets by mouth daily as needed for moderate  pain.     imipramine 25 MG tablet  Commonly known as:  TOFRANIL  Take 75 mg by mouth at bedtime.     levothyroxine 100 MCG tablet  Commonly known as:  SYNTHROID, LEVOTHROID  Take 100 mcg by mouth daily before breakfast.     mometasone 50 MCG/ACT nasal spray  Commonly known as:  NASONEX  Place 2 sprays into both nostrils daily as needed (for nasal congestion).     montelukast 10 MG tablet  Commonly known as:  SINGULAIR  Take 10 mg by mouth daily with breakfast.     OVER THE COUNTER MEDICATION  Place 1-2 drops into both eyes 2 (two) times daily as needed (For dry irritated eyes.). Allergy Eye Drops     predniSONE 5 MG tablet  Commonly known as:  DELTASONE  Take 1 tablet (5 mg total) by mouth daily with breakfast.     saccharomyces boulardii 250 MG capsule  Commonly known as:  FLORASTOR  Take 1 capsule (250 mg total) by mouth 2 (two) times daily.     triazolam 0.25 MG tablet  Commonly known as:  HALCION  Take 0.25 mg by mouth at bedtime. For sleep        Relevant Imaging Results:  Relevant Lab Results:  Recent Labs    Additional Information    Radonna Ricker, Student-SW [phone]

## 2015-05-18 NOTE — Progress Notes (Signed)
Micro lab called to notify that one of the bottles of blood culture growing positive gram cocci, Dr. Dyann Kief made aware.

## 2015-05-18 NOTE — Clinical Social Work Placement (Cosign Needed)
   CLINICAL SOCIAL WORK PLACEMENT  NOTE  Date:  05/18/2015  Patient Details  Name: [name] MRN: [id number] Date of Birth: 23-Apr-1931  Clinical Social Work is seeking post-discharge placement for this patient at the [place] level of care (*CSW will initial, date and re-position this form in  chart as items are completed):  Yes   Patient/family provided with [facility] Work Department's list of facilities offering this level of care within the geographic area requested by the patient (or if unable, by the patient's family).  Yes   Patient/family informed of their freedom to choose among providers that offer the needed level of care, that participate in Medicare, Medicaid or managed care program needed by the patient, have an available bed and are willing to accept the patient.  Yes   Patient/family informed of [facility]'s ownership interest in [facility] and [facility], as well as of the fact that they are under no obligation to receive care at these facilities.  PASRR submitted to EDS on       PASRR number received on 05/18/15     Existing PASRR number confirmed on 05/18/15     FL2 transmitted to all facilities in geographic area requested by pt/family on 05/18/15     FL2 transmitted to all facilities within larger geographic area on 05/18/15     Patient informed that his/her managed care company has contracts with or will negotiate with certain facilities, including the following:            Patient/family informed of bed offers received.  Patient chooses bed at       Physician recommends and patient chooses bed at      Patient to be transferred to   on  .  Patient to be transferred to facility by       Patient family notified on   of transfer.  Name of family member notified:        PHYSICIAN       Additional Comment:    _______________________________________________ Radonna Ricker, Student-SW 05/18/2015,  3:39 PM

## 2015-05-18 NOTE — Progress Notes (Signed)
Physical Therapy Treatment Patient Details Name: [name] MRN: [id number] DOB: 1930-10-24 Today's Date: 05/18/2015    History of Present Illness [name] is a 81 y.o. female, with scleroderma with CREST syndrome, diabetes mellitus and obstructive sleep apnea,, and hypothyroidism. She presents to the emergency department with fatigue and malaise. Pt found to have R middle lobe pneumonia on chest x-ray. Pt also legally blind.    PT Comments    Pt progressing well but con't to have mild SOB and generalized deconditioning. Pt remains unsafe to d/c home as she needs to be mod I due to living alone. Pt con't to desat on RA during activity and suspect pt with need to be d/c'd on O2. Due to patients impaired vision pt will need to learn to manage O2 tubing for safe mobility upon transition home.  Follow Up Recommendations  SNF;Supervision/Assistance - 24 hour     Equipment Recommendations  None recommended by PT    Recommendations for Other Services       Precautions / Restrictions Precautions Precautions: Other (comment) Precaution Comments: legally blind Restrictions Weight Bearing Restrictions: No   SATURATION QUALIFICATIONS: (This note is used to comply with regulatory documentation for home oxygen)  Patient Saturations on Room Air at Rest = 82%  Patient Saturations on Room Air while Ambulating = 72%  Patient Saturations on 3 Liters of oxygen while Ambulating = 92%  Please briefly explain why patient needs home oxygen: unable to maintain SpO2 >88% on RA   Mobility  Bed Mobility Overal bed mobility: Needs Assistance Bed Mobility: Supine to Sit     Supine to sit: Modified independent (Device/Increase time)     General bed mobility comments: HOB flat and able to use rail to sit EOB  Transfers Overall transfer level: Needs assistance Equipment used: None Transfers: Sit to/from Stand Sit to Stand: Min guard         General transfer comment: good use  of hands, increased time  Ambulation/Gait Ambulation/Gait assistance: Min guard Ambulation Distance (Feet): 150 Feet Assistive device: 1 person hand held assist Gait Pattern/deviations: Step-through pattern Gait velocity: improved from yesterday   General Gait Details: no episodes of LOB, mild SOB, SpO2 at 72% on RA and 89-90% on 3LO2 via [place]   Stairs            Wheelchair Mobility    Modified Rankin (Stroke Patients Only)       Balance Overall balance assessment: No apparent balance deficits (not formally assessed)                                  Cognition Arousal/Alertness: Awake/alert Behavior During Therapy: WFL for tasks assessed/performed Overall Cognitive Status: Within Functional Limits for tasks assessed                      Exercises      General Comments General comments (skin integrity, edema, etc.): assisted to bathroom, minA for line management, supervision for hygiene      Pertinent Vitals/Pain Pain Assessment: No/denies pain    Home Living                      Prior Function            PT Goals (current goals can now be found in the care plan section) Progress towards PT goals: Progressing toward goals    Frequency  Min 3X/week    PT Plan Current plan remains appropriate    Co-evaluation             End of Session Equipment Utilized During Treatment: Gait belt Activity Tolerance: Patient tolerated treatment well Patient left: in bed;with call bell/phone within reach     Time: [id number] PT Time Calculation (min) (ACUTE ONLY): 31 min  Charges:  $Gait Training: 8-22 mins $Therapeutic Activity: 8-22 mins                    G Codes:      Kingsley Callander 05/18/2015, 3:12 PM   Kittie Plater, PT, DPT Pager #: [phone] Office #: [phone]

## 2015-05-19 LAB — GLUCOSE, CAPILLARY: Glucose-Capillary: 129 mg/dL — ABNORMAL HIGH (ref 65–99)

## 2015-05-19 MED ORDER — AMOXICILLIN-POT CLAVULANATE 875-125 MG PO TABS: 1.0000 | ORAL_TABLET | Freq: Two times a day (BID) | ORAL | Status: DC

## 2015-05-19 NOTE — Progress Notes (Signed)
Pt provided with discharge instruction including information on follow up appointments and prescriptions. Pt instructed to take antibiotic prescription to pharmacy to fill today. Pt verbalized understanding of all information. IV was dc'd without complication. VSS. Pt escorted out via wheelchair by NT.

## 2015-05-19 NOTE — Care Management Note (Signed)
Case Management Note  Patient Details  Name: [name] MRN: [id number] Date of Birth: January 14, 1931  Subjective/Objective:        CM following for progression and d/c planning.            Action/Plan: 05/19/2015 Met with pt and daughter, Betzabeth Derringer re [facility] needs. They selected [facility] for [facility] services. [place] notified. Pt doing much better, no longer requiring SNF rehab, able to ambulate increased distances and O2 sats remaining in the 90's.   Expected Discharge Date:       05/19/2015           Expected Discharge Plan:  [place]  In-House Referral:  Clinical Social Work  Discharge planning Services  CM Consult  Post Acute Care Choice:  Home Health Choice offered to:  Patient  DME Arranged:  N/A DME Agency:  NA  HH Arranged:  RN, PT, OT, Nurse's Aide, Social Work [employer] Agency:  [place]  Status of Service:  Completed, signed off  Medicare Important Message Given:    Date Medicare IM Given:    Medicare IM give by:    Date Additional Medicare IM Given:    Additional Medicare Important Message give by:     If discussed at [place] of Stay Meetings, dates discussed:    Additional Comments:  Adron Bene, RN 05/19/2015, 10:27 AM

## 2015-05-19 NOTE — Discharge Summary (Signed)
Physician Discharge Summary  [name] [id number] DOB: 1930-10-17 DOA: 05/16/2015  PCP: Merrilee Seashore, MD  Admit date: 05/16/2015 Discharge date: 05/19/2015  Time spent: 35 minutes  Recommendations for Outpatient Follow-up:  1. Patient was discharged home with home health services for physical therapy, occupational therapy, home health aide and RN. Initially plans were made for discharge to SNF, however, patient verbalizing wishes to go home with [place]. By day of discharge she was able to walk independently to the nurses station and back maintaining oxygen saturations of 97% on room air. 2. Please follow up on respiratory status, she was treated for pneumonia during this hospitalization and required supplemental oxygen initially. 3. Follow-up on BMP and CBC on hospital follow-up visit   Discharge Diagnoses:  Principal Problem:   Sepsis ([place]) Active Problems:   Pure hypercholesterolemia   Legal blindness   BOOP (bronchiolitis obliterans with organizing pneumonia) ([place])   Diabetes mellitus ([place])   Fibromyalgia   HAP (hospital-acquired pneumonia)   HCAP (healthcare-associated pneumonia)   Discharge Condition: Stable/improved  Diet recommendation: Heart healthy  Filed Weights   05/16/15 2017 05/18/15 0221 05/18/15 2146  Weight: 62.4 kg (137 lb 9.1 oz) 65.1 kg (143 lb 8.3 oz) 66.1 kg (145 lb 11.6 oz)    History of present illness:  [name] is a 81 y.o. female, with scleroderma with CREST syndrome, diabetes mellitus and obstructive sleep apnea,, and hypothyroidism. She presents to the emergency department with fatigue and malaise. The patient reports she felt great yesterday. 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 This morning she woke up at approximately 6:30 AM and felt as though she had low blood sugar. She was clammy, weak and shaky. She found that she was too weak to stand and short of breath. She complains of nausea and mild sore  throat. In the ambulance she was found to have a temperature of 100.6. Chest x-ray in the ER shows right-sided middle lobe pneumonia. The patient reports that she frequently has pneumonia. In the past after having pneumonia she has developed BOOP. Her pulmonologist is Dr. Halford Chessman, and she sees him regularly. The patient was recently discharged after a 2 day stay in [facility] for diarrhea. She completed her Flagyl and the diarrhea has resolved. She does complain of a sore throat and believes that she may have a yeast infection in her mouth and throat after having been on antibiotics. She denies significant cough and sputum production.   Hospital Course:  [name] is a pleasant 81 year old community dwelling female, who at baseline is independent on her activities of daily living, was admitted to the medicine service on 05/16/2015 after presenting to the emergency department with complaints of worsening cough, fever, lab work showing the presence of a white count of 18,600. Chest x-ray on admission revealed presence of right-sided interstitial airspace opacities that were suspicious for right middle lobe pneumonia. She was initially started on broad-spectrum IV antibiotic therapy with Zosyn and vancomycin. Over the course of her hospitalization she showed gradual clinical improvement. Initially she was evaluated by physical therapy who recommended skilled nursing facility placement. It appear that her mobility was limited by shortness of breath. However, with improvement to her respiratory status she was able to ambulate to the nurses station and back without supplemental oxygen, maintaining oxygen saturations of 97%. With these findings patient voiced her wish is to go home rather than to a skilled nursing facility for acute rehabilitation. Case discussed with case manager who set  her up with home health physical therapy, occupational therapy, RN and aide. She was discharged home on Augmentin therapy to  complete a total of 7 days of antimicrobial therapy.   Discharge Exam: Filed Vitals:   05/18/15 2146 05/19/15 0509  BP: 99/66 132/41  Pulse: 71 70  Temp: 98.4 F (36.9 C) 98.6 F (37 C)  Resp: 17 16     General: Afebrile, states feeling better today, we ambulated down to the nurses station and back, looking for to going home today.   Cardiovascular: S1 and S2, no rubs or gallops  Respiratory: Normal respiratory effort, lungs overall clear to auscultation bilaterally. She was weaned off of supplemental oxygen  Abdomen: soft, NT, ND, positive BS  Musculoskeletal: no edema or cyanosis  Data Reviewed:  Discharge Instructions   Discharge Instructions    Call MD for:  difficulty breathing, headache or visual disturbances    Complete by:  As directed      Call MD for:  extreme fatigue    Complete by:  As directed      Call MD for:  hives    Complete by:  As directed      Call MD for:  persistant dizziness or light-headedness    Complete by:  As directed      Call MD for:  persistant nausea and vomiting    Complete by:  As directed      Call MD for:  redness, tenderness, or signs of infection (pain, swelling, redness, odor or green/yellow discharge around incision site)    Complete by:  As directed      Call MD for:  severe uncontrolled pain    Complete by:  As directed      Call MD for:  temperature >100.4    Complete by:  As directed      Diet - low sodium heart healthy    Complete by:  As directed      Increase activity slowly    Complete by:  As directed           Current Discharge Medication List    START taking these medications   Details  amoxicillin-clavulanate (AUGMENTIN) 875-125 MG tablet Take 1 tablet by mouth 2 (two) times daily. Qty: 8 tablet, Refills: 0      CONTINUE these medications which have NOT CHANGED   Details  aspirin EC 81 MG tablet Take 1 tablet (81 mg total) by mouth daily. Qty: 90 tablet, Refills: 3   Associated Diagnoses: Coronary  atherosclerosis of unspecified type of vessel, native or graft    cetirizine (ZYRTEC) 10 MG tablet Take 10 mg by mouth daily after breakfast.     esomeprazole (NEXIUM) 40 MG capsule Take 1 capsule (40 mg total) by mouth daily before breakfast. Qty: 30 capsule, Refills: 6    HYDROcodone-acetaminophen (NORCO/VICODIN) 5-325 MG tablet Take 0.5-1 tablets by mouth daily as needed for moderate pain.    imipramine (TOFRANIL) 25 MG tablet Take 75 mg by mouth at bedtime.     insulin lispro (HUMALOG KWIKPEN) 100 UNIT/ML KiwkPen Inject 4-6 Units into the skin 3 (three) times daily with meals. Inject 6 units subcutaneously with breakfast and lunch, inject 4 units with supper    levothyroxine (SYNTHROID, LEVOTHROID) 100 MCG tablet Take 100 mcg by mouth daily before breakfast.    mometasone (NASONEX) 50 MCG/ACT nasal spray Place 2 sprays into both nostrils daily as needed (for nasal congestion).     montelukast (SINGULAIR) 10 MG tablet Take  10 mg by mouth daily with breakfast.    OVER THE COUNTER MEDICATION Place 1-2 drops into both eyes 2 (two) times daily as needed (For dry irritated eyes.). Allergy Eye Drops    predniSONE (DELTASONE) 5 MG tablet Take 1 tablet (5 mg total) by mouth daily with breakfast. Qty: 100 tablet, Refills: 1    saccharomyces boulardii (FLORASTOR) 250 MG capsule Take 1 capsule (250 mg total) by mouth 2 (two) times daily. Qty: 60 capsule, Refills: 3    triazolam (HALCION) 0.25 MG tablet Take 0.25 mg by mouth at bedtime. For sleep      STOP taking these medications     insulin lispro (HUMALOG) 100 UNIT/ML injection        Allergies  Allergen Reactions  . Lantus [Insulin Glargine]     Patient states "sulfate binder causes sinus infection/pneumonia".  [place] [Levofloxacin In D5w] Other (See Comments)    Muscle aches  . Maxidex [Dexamethasone] Other (See Comments)    Insomnia, anxiety, dizziness  . Metoprolol-Hydrochlorothiazide Other (See Comments)     decreased  bp  . Norvasc [Amlodipine] Other (See Comments)    BIL Ankle edema  . Novolog [Insulin Aspart]     Patient states "sulfate binder causes sinus infection/pneumonia". Tolerates Humalog.  . Sulfonamide Derivatives Hives  . Titanium Other (See Comments)    Neck wouldn't heal with titanium cervical plate  . Adhesive [Tape] Hives and Rash   Follow-up Information    Follow up with [facility], MD On 05/27/2015.   Specialty:  Internal Medicine   Why:  APPOINTMENT: Thursday, 05-27-15 @ 10:45am   Contact information:   [address] [place] [address] [phone]        The results of significant diagnostics from this hospitalization (including imaging, microbiology, ancillary and laboratory) are listed below for reference.    Significant Diagnostic Studies: Dg Chest 2 View  05/16/2015  CLINICAL DATA:  Pt c/o sharp pain between her shoulder blades, fever, clammy skin, dizziness, nausea, and SOB since yesterday morning. Hx of CAD, HTN, DM, GERD, emphysema, pneumonia, COPD, MI, and bronchitis. Pt is an ex-smoker. EXAM: CHEST  2 VIEW COMPARISON:  01/29/2015 FINDINGS: Surgical changes about the low left neck. Midline trachea. Normal heart size. Atherosclerosis in the transverse aorta. No pleural effusion or pneumothorax. Interstitial thickening is chronic. Superimposed patchy opacity within the right mid and lower lung zone on the frontal radiograph. This may correspond increased density anteriorly on the lateral view. There is also an area of right upper lobe pulmonary opacity laterally which has a somewhat nodular component. IMPRESSION: increased right-sided interstitial and airspace opacities, suspicious for right middle lobe pneumonia superimposed upon COPD/ chronic bronchitis. A right upper lobe opacity is also favored to be infectious but is somewhat nodular and new since 01/29/2015. Recommend appropriate antibiotic therapy and radiographic follow-up in 1-2 weeks. If this  area of right upper lobe nodularity is persistent, chest CT would be recommended. Atherosclerosis in the transverse aorta. Electronically Signed   By: Abigail Miyamoto M.D.   On: 05/16/2015 14:48   Ct Abdomen Pelvis W Contrast  05/01/2015  CLINICAL DATA:  Pt presents from home via EMS c/o diarrhea x 2 weeks with occasional bright red appearance and increased gas. Pt is legally blind and therefore is unsure of whether or not she saw blood in her stool. Hx of insulin-dependent diabetes II and diverticulitis EXAM: CT ABDOMEN AND PELVIS WITH CONTRAST TECHNIQUE: Multidetector CT imaging of the abdomen and pelvis was performed  using the standard protocol following bolus administration of intravenous contrast. CONTRAST:  172mL OMNIPAQUE IOHEXOL 300 MG/ML  SOLN COMPARISON:  CT, 04/24/2012 FINDINGS: Lung bases: Vascular and interstitial prominence. No overt pulmonary edema or evidence of pneumonia. Heart normal in size. Liver and spleen:  Unremarkable. Gallbladder surgically absent. Mild prominence of the intern extrahepatic biliary tree, stable. Pancreas:  Normal. Adrenal glands:  No masses. Kidneys, ureters, bladder: Tiny low-density lesion arises from the anterior lower pole of the left kidney, likely a cyst. This is stable. No other renal masses or lesions. No stones. No hydronephrosis. Ureters normal course and in caliber. Bladder is unremarkable. Uterus and adnexa:  Uterus surgically absent.  No pelvic masses. Lymph nodes:  No adenopathy. Ascites:  None. Vascular: Atherosclerotic changes are noted throughout the abdominal aorta and at the origin of several branch vessels. This is stable. Gastrointestinal: Several scattered colonic diverticula. No diverticulitis. No colon wall thickening or inflammatory changes. No bowel dilation is seen to suggest obstruction or generalized adynamic ileus. Small bowel is unremarkable. Stomach is unremarkable. Musculoskeletal: Bones demineralized. Degenerative changes noted of the lumbar  spine. No osteoblastic osteolytic lesions. IMPRESSION: 1. No acute findings. 2. There are scattered colonic diverticula, but no evidence diverticulitis. There are no findings to explain GI bleeding. 3. Chronic findings include changes from a cholecystectomy and hysterectomy and atherosclerotic changes throughout the abdominal aorta and its branch vessels. Electronically Signed   By: Lajean Manes M.D.   On: 05/01/2015 23:26    Microbiology: Recent Results (from the past 240 hour(s))  Blood culture (routine x 2)     Status: None (Preliminary result)   Collection Time: 05/16/15  3:18 PM  Result Value Ref Range Status   Specimen Description BLOOD LEFT HAND  Final   Special Requests BOTTLES DRAWN AEROBIC AND ANAEROBIC 5CC  Final   Culture  Setup Time   Final    GRAM POSITIVE COCCI IN PAIRS ANAEROBIC BOTTLE ONLY CRITICAL RESULT CALLED TO, READ BACK BY AND VERIFIED WITH: J NARAMDAS,RN AT 1017 05/18/15 BY L BENFIELD    Culture   Final    STAPHYLOCOCCUS SPECIES (COAGULASE NEGATIVE) THE SIGNIFICANCE OF ISOLATING THIS ORGANISM FROM A SINGLE SET OF BLOOD CULTURES WHEN MULTIPLE SETS ARE DRAWN IS UNCERTAIN. PLEASE NOTIFY THE MICROBIOLOGY DEPARTMENT WITHIN ONE WEEK IF SPECIATION AND SENSITIVITIES ARE REQUIRED.    Report Status PENDING  Incomplete  Blood culture (routine x 2)     Status: None (Preliminary result)   Collection Time: 05/16/15  3:30 PM  Result Value Ref Range Status   Specimen Description BLOOD LEFT ANTECUBITAL  Final   Special Requests BOTTLES DRAWN AEROBIC AND ANAEROBIC 10CC  Final   Culture NO GROWTH 2 DAYS  Final   Report Status PENDING  Incomplete     Labs: Basic Metabolic Panel:  Recent Labs Lab 05/16/15 1340 05/17/15 0600  NA 129* 133*  K 4.3 3.8  CL 92* 100*  CO2 25 27  GLUCOSE 264* 119*  BUN 26* 18  CREATININE 0.90 0.74  CALCIUM 8.7* 8.0*   Liver Function Tests: No results for input(s): AST, ALT, ALKPHOS, BILITOT, PROT, ALBUMIN in the last 168 hours. No results  for input(s): LIPASE, AMYLASE in the last 168 hours. No results for input(s): AMMONIA in the last 168 hours. CBC:  Recent Labs Lab 05/16/15 1340 05/17/15 0600  WBC 18.6* 15.3*  HGB 13.4 11.9*  HCT 40.3 37.1  MCV 84.5 85.7  PLT 177 156   Cardiac Enzymes: No results for input(s): CKTOTAL,  CKMB, CKMBINDEX, TROPONINI in the last 168 hours. BNP: BNP (last 3 results) No results for input(s): BNP in the last 8760 hours.  ProBNP (last 3 results)  Recent Labs  01/18/15 1542 01/29/15 1553 02/02/15 1156  PROBNP 75.0 40.0 150.0*    CBG:  Recent Labs Lab 05/18/15 0816 05/18/15 1147 05/18/15 1703 05/18/15 2140 05/19/15 0749  GLUCAP 154* 194* 234* 186* 129*       Signed:  Kelvin Cellar  [facility] 05/19/2015, 10:49 AM

## 2015-05-19 NOTE — Care Management Important Message (Deleted)
Important Message  Patient Details  Name: KENDRICKA FEASEL MRN: [id number] Date of Birth: [birth year]   Medicare Important Message Given:  Yes    Avis Mcmahill, Rory Percy, RN 05/19/2015, 11:12 AM

## 2015-05-20 LAB — CULTURE, BLOOD (ROUTINE X 2)

## 2015-05-21 DIAGNOSIS — J440 Chronic obstructive pulmonary disease with acute lower respiratory infection: Secondary | ICD-10-CM | POA: Diagnosis not present

## 2015-05-21 DIAGNOSIS — M3500 Sicca syndrome, unspecified: Secondary | ICD-10-CM | POA: Diagnosis not present

## 2015-05-21 DIAGNOSIS — M6281 Muscle weakness (generalized): Secondary | ICD-10-CM | POA: Diagnosis not present

## 2015-05-21 DIAGNOSIS — E7800 Pure hypercholesterolemia, unspecified: Secondary | ICD-10-CM | POA: Diagnosis not present

## 2015-05-21 DIAGNOSIS — J189 Pneumonia, unspecified organism: Secondary | ICD-10-CM | POA: Diagnosis not present

## 2015-05-21 DIAGNOSIS — F329 Major depressive disorder, single episode, unspecified: Secondary | ICD-10-CM | POA: Diagnosis not present

## 2015-05-21 DIAGNOSIS — E039 Hypothyroidism, unspecified: Secondary | ICD-10-CM | POA: Diagnosis not present

## 2015-05-21 DIAGNOSIS — I272 Other secondary pulmonary hypertension: Secondary | ICD-10-CM | POA: Diagnosis not present

## 2015-05-21 DIAGNOSIS — Z90710 Acquired absence of both cervix and uterus: Secondary | ICD-10-CM | POA: Diagnosis not present

## 2015-05-21 DIAGNOSIS — I10 Essential (primary) hypertension: Secondary | ICD-10-CM | POA: Diagnosis not present

## 2015-05-21 DIAGNOSIS — E119 Type 2 diabetes mellitus without complications: Secondary | ICD-10-CM | POA: Diagnosis not present

## 2015-05-21 DIAGNOSIS — I2510 Atherosclerotic heart disease of native coronary artery without angina pectoris: Secondary | ICD-10-CM | POA: Diagnosis not present

## 2015-05-21 DIAGNOSIS — K225 Diverticulum of esophagus, acquired: Secondary | ICD-10-CM | POA: Diagnosis not present

## 2015-05-21 DIAGNOSIS — G4733 Obstructive sleep apnea (adult) (pediatric): Secondary | ICD-10-CM | POA: Diagnosis not present

## 2015-05-21 DIAGNOSIS — K5790 Diverticulosis of intestine, part unspecified, without perforation or abscess without bleeding: Secondary | ICD-10-CM | POA: Diagnosis not present

## 2015-05-21 DIAGNOSIS — H548 Legal blindness, as defined in USA: Secondary | ICD-10-CM | POA: Diagnosis not present

## 2015-05-21 DIAGNOSIS — I252 Old myocardial infarction: Secondary | ICD-10-CM | POA: Diagnosis not present

## 2015-05-21 DIAGNOSIS — H3530 Unspecified macular degeneration: Secondary | ICD-10-CM | POA: Diagnosis not present

## 2015-05-21 DIAGNOSIS — K589 Irritable bowel syndrome without diarrhea: Secondary | ICD-10-CM | POA: Diagnosis not present

## 2015-05-21 DIAGNOSIS — M1990 Unspecified osteoarthritis, unspecified site: Secondary | ICD-10-CM | POA: Diagnosis not present

## 2015-05-21 DIAGNOSIS — K219 Gastro-esophageal reflux disease without esophagitis: Secondary | ICD-10-CM | POA: Diagnosis not present

## 2015-05-21 DIAGNOSIS — M5090 Cervical disc disorder, unspecified, unspecified cervical region: Secondary | ICD-10-CM | POA: Diagnosis not present

## 2015-05-21 DIAGNOSIS — Z9049 Acquired absence of other specified parts of digestive tract: Secondary | ICD-10-CM | POA: Diagnosis not present

## 2015-05-21 DIAGNOSIS — M341 CR(E)ST syndrome: Secondary | ICD-10-CM | POA: Diagnosis not present

## 2015-05-21 DIAGNOSIS — M797 Fibromyalgia: Secondary | ICD-10-CM | POA: Diagnosis not present

## 2015-05-21 LAB — CULTURE, BLOOD (ROUTINE X 2): Culture: NO GROWTH

## 2015-05-24 DIAGNOSIS — J440 Chronic obstructive pulmonary disease with acute lower respiratory infection: Secondary | ICD-10-CM | POA: Diagnosis not present

## 2015-05-24 DIAGNOSIS — I2510 Atherosclerotic heart disease of native coronary artery without angina pectoris: Secondary | ICD-10-CM | POA: Diagnosis not present

## 2015-05-24 DIAGNOSIS — M6281 Muscle weakness (generalized): Secondary | ICD-10-CM | POA: Diagnosis not present

## 2015-05-24 DIAGNOSIS — I10 Essential (primary) hypertension: Secondary | ICD-10-CM | POA: Diagnosis not present

## 2015-05-24 DIAGNOSIS — J189 Pneumonia, unspecified organism: Secondary | ICD-10-CM | POA: Diagnosis not present

## 2015-05-24 DIAGNOSIS — E119 Type 2 diabetes mellitus without complications: Secondary | ICD-10-CM | POA: Diagnosis not present

## 2015-05-25 DIAGNOSIS — I10 Essential (primary) hypertension: Secondary | ICD-10-CM | POA: Diagnosis not present

## 2015-05-25 DIAGNOSIS — J440 Chronic obstructive pulmonary disease with acute lower respiratory infection: Secondary | ICD-10-CM | POA: Diagnosis not present

## 2015-05-25 DIAGNOSIS — M6281 Muscle weakness (generalized): Secondary | ICD-10-CM | POA: Diagnosis not present

## 2015-05-25 DIAGNOSIS — J189 Pneumonia, unspecified organism: Secondary | ICD-10-CM | POA: Diagnosis not present

## 2015-05-25 DIAGNOSIS — E119 Type 2 diabetes mellitus without complications: Secondary | ICD-10-CM | POA: Diagnosis not present

## 2015-05-25 DIAGNOSIS — I2510 Atherosclerotic heart disease of native coronary artery without angina pectoris: Secondary | ICD-10-CM | POA: Diagnosis not present

## 2015-05-26 DIAGNOSIS — I10 Essential (primary) hypertension: Secondary | ICD-10-CM | POA: Diagnosis not present

## 2015-05-26 DIAGNOSIS — E119 Type 2 diabetes mellitus without complications: Secondary | ICD-10-CM | POA: Diagnosis not present

## 2015-05-26 DIAGNOSIS — M6281 Muscle weakness (generalized): Secondary | ICD-10-CM | POA: Diagnosis not present

## 2015-05-26 DIAGNOSIS — J440 Chronic obstructive pulmonary disease with acute lower respiratory infection: Secondary | ICD-10-CM | POA: Diagnosis not present

## 2015-05-26 DIAGNOSIS — J189 Pneumonia, unspecified organism: Secondary | ICD-10-CM | POA: Diagnosis not present

## 2015-05-26 DIAGNOSIS — I2510 Atherosclerotic heart disease of native coronary artery without angina pectoris: Secondary | ICD-10-CM | POA: Diagnosis not present

## 2015-05-28 DIAGNOSIS — I2510 Atherosclerotic heart disease of native coronary artery without angina pectoris: Secondary | ICD-10-CM | POA: Diagnosis not present

## 2015-05-28 DIAGNOSIS — I10 Essential (primary) hypertension: Secondary | ICD-10-CM | POA: Diagnosis not present

## 2015-05-28 DIAGNOSIS — E119 Type 2 diabetes mellitus without complications: Secondary | ICD-10-CM | POA: Diagnosis not present

## 2015-05-28 DIAGNOSIS — J189 Pneumonia, unspecified organism: Secondary | ICD-10-CM | POA: Diagnosis not present

## 2015-05-28 DIAGNOSIS — M6281 Muscle weakness (generalized): Secondary | ICD-10-CM | POA: Diagnosis not present

## 2015-05-28 DIAGNOSIS — J440 Chronic obstructive pulmonary disease with acute lower respiratory infection: Secondary | ICD-10-CM | POA: Diagnosis not present

## 2015-05-31 DIAGNOSIS — J189 Pneumonia, unspecified organism: Secondary | ICD-10-CM | POA: Diagnosis not present

## 2015-05-31 DIAGNOSIS — E119 Type 2 diabetes mellitus without complications: Secondary | ICD-10-CM | POA: Diagnosis not present

## 2015-05-31 DIAGNOSIS — M6281 Muscle weakness (generalized): Secondary | ICD-10-CM | POA: Diagnosis not present

## 2015-05-31 DIAGNOSIS — I10 Essential (primary) hypertension: Secondary | ICD-10-CM | POA: Diagnosis not present

## 2015-05-31 DIAGNOSIS — I2510 Atherosclerotic heart disease of native coronary artery without angina pectoris: Secondary | ICD-10-CM | POA: Diagnosis not present

## 2015-05-31 DIAGNOSIS — J440 Chronic obstructive pulmonary disease with acute lower respiratory infection: Secondary | ICD-10-CM | POA: Diagnosis not present

## 2015-06-01 DIAGNOSIS — M6281 Muscle weakness (generalized): Secondary | ICD-10-CM | POA: Diagnosis not present

## 2015-06-01 DIAGNOSIS — J440 Chronic obstructive pulmonary disease with acute lower respiratory infection: Secondary | ICD-10-CM | POA: Diagnosis not present

## 2015-06-01 DIAGNOSIS — I10 Essential (primary) hypertension: Secondary | ICD-10-CM | POA: Diagnosis not present

## 2015-06-01 DIAGNOSIS — I2510 Atherosclerotic heart disease of native coronary artery without angina pectoris: Secondary | ICD-10-CM | POA: Diagnosis not present

## 2015-06-01 DIAGNOSIS — E119 Type 2 diabetes mellitus without complications: Secondary | ICD-10-CM | POA: Diagnosis not present

## 2015-06-01 DIAGNOSIS — J189 Pneumonia, unspecified organism: Secondary | ICD-10-CM | POA: Diagnosis not present

## 2015-06-02 DIAGNOSIS — I10 Essential (primary) hypertension: Secondary | ICD-10-CM | POA: Diagnosis not present

## 2015-06-02 DIAGNOSIS — I2510 Atherosclerotic heart disease of native coronary artery without angina pectoris: Secondary | ICD-10-CM | POA: Diagnosis not present

## 2015-06-02 DIAGNOSIS — J440 Chronic obstructive pulmonary disease with acute lower respiratory infection: Secondary | ICD-10-CM | POA: Diagnosis not present

## 2015-06-02 DIAGNOSIS — M6281 Muscle weakness (generalized): Secondary | ICD-10-CM | POA: Diagnosis not present

## 2015-06-02 DIAGNOSIS — J189 Pneumonia, unspecified organism: Secondary | ICD-10-CM | POA: Diagnosis not present

## 2015-06-02 DIAGNOSIS — E119 Type 2 diabetes mellitus without complications: Secondary | ICD-10-CM | POA: Diagnosis not present

## 2015-06-03 DIAGNOSIS — E119 Type 2 diabetes mellitus without complications: Secondary | ICD-10-CM | POA: Diagnosis not present

## 2015-06-03 DIAGNOSIS — I10 Essential (primary) hypertension: Secondary | ICD-10-CM | POA: Diagnosis not present

## 2015-06-03 DIAGNOSIS — M6281 Muscle weakness (generalized): Secondary | ICD-10-CM | POA: Diagnosis not present

## 2015-06-03 DIAGNOSIS — I2510 Atherosclerotic heart disease of native coronary artery without angina pectoris: Secondary | ICD-10-CM | POA: Diagnosis not present

## 2015-06-03 DIAGNOSIS — J440 Chronic obstructive pulmonary disease with acute lower respiratory infection: Secondary | ICD-10-CM | POA: Diagnosis not present

## 2015-06-03 DIAGNOSIS — J189 Pneumonia, unspecified organism: Secondary | ICD-10-CM | POA: Diagnosis not present

## 2015-06-04 DIAGNOSIS — E119 Type 2 diabetes mellitus without complications: Secondary | ICD-10-CM | POA: Diagnosis not present

## 2015-06-04 DIAGNOSIS — I10 Essential (primary) hypertension: Secondary | ICD-10-CM | POA: Diagnosis not present

## 2015-06-04 DIAGNOSIS — J189 Pneumonia, unspecified organism: Secondary | ICD-10-CM | POA: Diagnosis not present

## 2015-06-04 DIAGNOSIS — J440 Chronic obstructive pulmonary disease with acute lower respiratory infection: Secondary | ICD-10-CM | POA: Diagnosis not present

## 2015-06-04 DIAGNOSIS — M6281 Muscle weakness (generalized): Secondary | ICD-10-CM | POA: Diagnosis not present

## 2015-06-04 DIAGNOSIS — I2510 Atherosclerotic heart disease of native coronary artery without angina pectoris: Secondary | ICD-10-CM | POA: Diagnosis not present

## 2015-06-07 DIAGNOSIS — J440 Chronic obstructive pulmonary disease with acute lower respiratory infection: Secondary | ICD-10-CM | POA: Diagnosis not present

## 2015-06-07 DIAGNOSIS — M6281 Muscle weakness (generalized): Secondary | ICD-10-CM | POA: Diagnosis not present

## 2015-06-07 DIAGNOSIS — E119 Type 2 diabetes mellitus without complications: Secondary | ICD-10-CM | POA: Diagnosis not present

## 2015-06-07 DIAGNOSIS — I10 Essential (primary) hypertension: Secondary | ICD-10-CM | POA: Diagnosis not present

## 2015-06-07 DIAGNOSIS — I2510 Atherosclerotic heart disease of native coronary artery without angina pectoris: Secondary | ICD-10-CM | POA: Diagnosis not present

## 2015-06-07 DIAGNOSIS — J189 Pneumonia, unspecified organism: Secondary | ICD-10-CM | POA: Diagnosis not present

## 2015-06-08 DIAGNOSIS — I2510 Atherosclerotic heart disease of native coronary artery without angina pectoris: Secondary | ICD-10-CM | POA: Diagnosis not present

## 2015-06-08 DIAGNOSIS — J440 Chronic obstructive pulmonary disease with acute lower respiratory infection: Secondary | ICD-10-CM | POA: Diagnosis not present

## 2015-06-08 DIAGNOSIS — E119 Type 2 diabetes mellitus without complications: Secondary | ICD-10-CM | POA: Diagnosis not present

## 2015-06-08 DIAGNOSIS — J189 Pneumonia, unspecified organism: Secondary | ICD-10-CM | POA: Diagnosis not present

## 2015-06-08 DIAGNOSIS — I10 Essential (primary) hypertension: Secondary | ICD-10-CM | POA: Diagnosis not present

## 2015-06-08 DIAGNOSIS — M6281 Muscle weakness (generalized): Secondary | ICD-10-CM | POA: Diagnosis not present

## 2015-06-09 DIAGNOSIS — E119 Type 2 diabetes mellitus without complications: Secondary | ICD-10-CM | POA: Diagnosis not present

## 2015-06-09 DIAGNOSIS — M6281 Muscle weakness (generalized): Secondary | ICD-10-CM | POA: Diagnosis not present

## 2015-06-09 DIAGNOSIS — I10 Essential (primary) hypertension: Secondary | ICD-10-CM | POA: Diagnosis not present

## 2015-06-09 DIAGNOSIS — J189 Pneumonia, unspecified organism: Secondary | ICD-10-CM | POA: Diagnosis not present

## 2015-06-09 DIAGNOSIS — I2510 Atherosclerotic heart disease of native coronary artery without angina pectoris: Secondary | ICD-10-CM | POA: Diagnosis not present

## 2015-06-09 DIAGNOSIS — J440 Chronic obstructive pulmonary disease with acute lower respiratory infection: Secondary | ICD-10-CM | POA: Diagnosis not present

## 2015-06-11 DIAGNOSIS — M25572 Pain in left ankle and joints of left foot: Secondary | ICD-10-CM | POA: Diagnosis not present

## 2015-06-11 DIAGNOSIS — K5730 Diverticulosis of large intestine without perforation or abscess without bleeding: Secondary | ICD-10-CM | POA: Diagnosis not present

## 2015-06-11 DIAGNOSIS — E871 Hypo-osmolality and hyponatremia: Secondary | ICD-10-CM | POA: Diagnosis not present

## 2015-06-11 DIAGNOSIS — I10 Essential (primary) hypertension: Secondary | ICD-10-CM | POA: Diagnosis not present

## 2015-06-14 ENCOUNTER — Encounter: Payer: Self-pay | Admitting: Gastroenterology

## 2015-06-14 ENCOUNTER — Ambulatory Visit (INDEPENDENT_AMBULATORY_CARE_PROVIDER_SITE_OTHER): Payer: Medicare Other | Admitting: Gastroenterology

## 2015-06-14 VITALS — BP 150/70 | HR 82 | Ht 62.0 in | Wt 135.0 lb

## 2015-06-14 DIAGNOSIS — M6281 Muscle weakness (generalized): Secondary | ICD-10-CM | POA: Diagnosis not present

## 2015-06-14 DIAGNOSIS — I10 Essential (primary) hypertension: Secondary | ICD-10-CM | POA: Diagnosis not present

## 2015-06-14 DIAGNOSIS — E119 Type 2 diabetes mellitus without complications: Secondary | ICD-10-CM | POA: Diagnosis not present

## 2015-06-14 DIAGNOSIS — J440 Chronic obstructive pulmonary disease with acute lower respiratory infection: Secondary | ICD-10-CM | POA: Diagnosis not present

## 2015-06-14 DIAGNOSIS — I2510 Atherosclerotic heart disease of native coronary artery without angina pectoris: Secondary | ICD-10-CM | POA: Diagnosis not present

## 2015-06-14 DIAGNOSIS — R194 Change in bowel habit: Secondary | ICD-10-CM | POA: Diagnosis not present

## 2015-06-14 DIAGNOSIS — J189 Pneumonia, unspecified organism: Secondary | ICD-10-CM | POA: Diagnosis not present

## 2015-06-14 NOTE — Patient Instructions (Signed)
You will be set up for a flexible sigmoidoscopy, potential balloon dilation (WL with MAC).

## 2015-06-14 NOTE — Progress Notes (Signed)
HPI: This is a     Very pleasant 81 year old woman whom I am meeting for the first time today   she is here with her daughter   She is partially blind   Chief complaint is  Abnormal bowel habits;  Intermittent diarrhea, intermittent fecal incontinence, intermittent constipation   Scleroderma for a long time, just recently diagnosed however.  Recent hospitalization for pneumonia  Was also in hosp for 2 night stay for diarrhea,  Testing was negative but they presumed she had Clostridium difficile  She will have loose stools intermittently, bloating.  Can have NO BM for 5-6 days as well.  Can have terrible loose stools at times.  Really started after her sigmoid resection in 2013.    Will have small surgery    she underwent colonoscopy with Dr. Erskine Emery June 2011. This exam revealed significant stenosis in stricturing in the sigmoid  Colon  Which he presumed was from chronic diverticular disease , adhesions.   Indeed she underwent sigmoid resection 2013 with Dr. Remo Lipps gross , primary anastomosis for chronic stricturing, chronic diverticulitis.    Past Medical History  Diagnosis Date  . Coronary artery disease   . 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 Hypertension   . Dyslipidemia   . Diabetes mellitus   . CVA (cerebral infarction) 1991  . Macular degeneration   . GERD (gastroesophageal reflux disease)   . Diverticulosis   . IBS (irritable bowel syndrome)   . Zenker's diverticulum   . Emphysema   . Pulmonary hypertension ([place])   . Chronic sinusitis   . Fibromyalgia   . Hypothyroidism   . Sjogren's disease ([place])   . Raynaud's phenomenon   . Cervical disc disease   . Adenomatous colon polyp   . Scleroderma ([place])   . COPD (chronic obstructive pulmonary disease) ([place])   . Arthritis   . Blood transfusion   . Myocardial infarction [facility]) may 27th 2007  . Depression   . Stroke [facility]) 1990    brain stem stroke - weakness rt hand  . Cancer [facility])     cervical cancer pre  hysterectomy  . Sleep apnea     STOP BANG SCORE 5  . Legally blind   . CONSTIPATION, CHRONIC 06/26/2007    Qualifier: Diagnosis of  By: Nils Pyle CMA ([place]), Mearl Latin    . Pneumonia 07/16/2013     HX PNEUMONIA  . Pneumonia   . Bronchitis     Past Surgical History  Procedure Laterality Date  . Total abdominal hysterectomy  1973  . Bilateral oophorectomy  2002  . Spinal fusion  1997    and implantation of a plate   . Lobe thyroid removal    . Thyroid lobectomy  1991    removal of left lobe thyroid  . Cholecystectomy  1965  . Appendectomy    . Cataract extraction    . Bronchoscopy  02-22-09  . Stented cardiac  artery  2007 / 2007 / 2010    X 3 STENT  . Proctoscopy  03/18/2012    Procedure: PROCTOSCOPY;  Surgeon: Adin Hector, MD;  Location: [facility];  Service: General;  Laterality: N/A;  rigid procto  . Colon surgery  2014    colon resection  . Esophagogastroduodenoscopy (egd) with esophageal dilation N/A 06/18/2013    Procedure: ESOPHAGOGASTRODUODENOSCOPY (EGD) WITH ESOPHAGEAL DILATION;  Surgeon: Inda Castle, MD;  Location: [place];  Service: Endoscopy;  Laterality: N/A;  . Cardiac catheterization  x 3  . Colonectomy    . Radiology with anesthesia N/A 03/09/2015    Procedure: MRI CERVICAL SPINE WITHOUT AND LUMBER WITHOUT;  Surgeon: Medication Radiologist, MD;  Location: [place];  Service: Radiology;  Laterality: N/A;    Current Outpatient Prescriptions  Medication Sig Dispense Refill  . aspirin EC 81 MG tablet Take 1 tablet (81 mg total) by mouth daily. (Patient taking differently: Take 81 mg by mouth daily after breakfast. ) 90 tablet 3  . cephALEXin (KEFLEX) 500 MG capsule Take 1 capsule by mouth 3 (three) times daily.    . cetirizine (ZYRTEC) 10 MG tablet Take 10 mg by mouth daily after breakfast.     . esomeprazole (NEXIUM) 40 MG capsule Take 1 capsule (40 mg total) by mouth daily before breakfast. (Patient taking differently: Take 40 mg by mouth daily with  breakfast. ) 30 capsule 6  . HYDROcodone-acetaminophen (NORCO/VICODIN) 5-325 MG tablet Take 0.5-1 tablets by mouth daily as needed for moderate pain.    [name] imipramine (TOFRANIL) 25 MG tablet Take 75 mg by mouth at bedtime.     . insulin lispro (HUMALOG KWIKPEN) 100 UNIT/ML KiwkPen Inject 4-6 Units into the skin 3 (three) times daily with meals. Inject 6 units subcutaneously with breakfast and lunch, inject 4 units with supper    . levothyroxine (SYNTHROID, LEVOTHROID) 100 MCG tablet Take 100 mcg by mouth daily before breakfast.    . mometasone (NASONEX) 50 MCG/ACT nasal spray Place 2 sprays into both nostrils daily as needed (for nasal congestion).     . montelukast (SINGULAIR) 10 MG tablet Take 10 mg by mouth daily with breakfast.    . OVER THE COUNTER MEDICATION Place 1-2 drops into both eyes 2 (two) times daily as needed (For dry irritated eyes.). Allergy Eye Drops    . predniSONE (DELTASONE) 5 MG tablet Take 1 tablet (5 mg total) by mouth daily with breakfast. 100 tablet 1  . saccharomyces boulardii (FLORASTOR) 250 MG capsule Take 1 capsule (250 mg total) by mouth 2 (two) times daily. (Patient taking differently: Take 250 mg by mouth daily with breakfast. ) 60 capsule 3  . triazolam (HALCION) 0.25 MG tablet Take 0.25 mg by mouth at bedtime. For sleep     No current facility-administered medications for this visit.    Allergies as of 06/14/2015 - Review Complete 06/14/2015  Allergen Reaction Noted  . Lantus [insulin glargine]  03/14/2012  . Levaquin [levofloxacin in d5w] Other (See Comments) 11/02/2014  . Maxidex [dexamethasone] Other (See Comments) 01/29/2015  . Metoprolol-hydrochlorothiazide Other (See Comments)   . Norvasc [amlodipine] Other (See Comments) 01/29/2015  . Novolog [insulin aspart]  03/18/2012  . Sulfonamide derivatives Hives   . Titanium Other (See Comments) 03/27/2011  . Adhesive [tape] Hives and Rash 03/13/2012    Family History  Problem Relation Age of Onset  .  Other Mother 76    Died from sepsis  . Stroke Father 40    died  . Heart disease Father   . Diabetes Father   . Cancer Sister     lung  . Diabetes Sister   . Cancer Brother     lung  . Diabetes Brother   . Diabetes Brother     Social History   Social History  . Marital Status: Divorced    Spouse Name: N/A  . Number of Children: N/A  . Years of Education: N/A   Occupational History  . retired    Social History Main Topics  .  Smoking status: Former Smoker -- 1.00 packs/day for 40 years    Types: Cigarettes    Quit date: 06/26/1988  . Smokeless tobacco: Never Used     Comment: 40 pack years  . Alcohol Use: No  . Drug Use: No  . Sexual Activity: Not on file   Other Topics Concern  . Not on file   Social History Narrative     Physical Exam: BP 150/70 mmHg  Pulse 82  Ht 5\' 2"  (1.575 m)  Wt 135 lb (61.236 kg)  BMI 24.69 kg/m2 Constitutional: generally well-appearing Psychiatric: alert and oriented x3 Abdomen: soft, nontender, nondistended, no obvious ascites, no peritoneal signs, normal bowel sounds   Assessment and plan: 81 y.o. female with  Intermittent constipation, significant intermittent loose stools, intermittent bloating , history of sigmoid resection , scleroderma   she is pretty clear that her bowel patterns been different for her since she had her sigmoid resection in 2013. Possibly she is dealing with a anastomotic stricture. She also has scleroderma which puts her at high risk for bacterial overgrowth area her symptoms aren't very classic for overgrowth and that she is bothered by constipation as well as loose stools. I think her symptoms seem more consistent with overflow type diarrhea they can happen with a stricture. I recommended we proceed with flexible sigmoidoscopy at her soonest convenience. I did explain that if she has a stricture dilating it is usually the best course of action. She understands there are risks with dilation including  perforation and bleeding. I explained to her that I would be very safe enough I felt that she had a stricture that was causing her problems and it is appearing amenable to dilation that I performed balloon dilation at that time. She also understands that dilation may need to occur 2-3 times for full effect. She is on no blood thinners. We'll proceed with this plan at her soonest convenience.   Owens Loffler, MD [facility] 06/14/2015, 3:05 PM

## 2015-06-15 ENCOUNTER — Encounter (HOSPITAL_COMMUNITY): Payer: Self-pay | Admitting: *Deleted

## 2015-06-15 DIAGNOSIS — E119 Type 2 diabetes mellitus without complications: Secondary | ICD-10-CM | POA: Diagnosis not present

## 2015-06-15 DIAGNOSIS — M6281 Muscle weakness (generalized): Secondary | ICD-10-CM | POA: Diagnosis not present

## 2015-06-15 DIAGNOSIS — J189 Pneumonia, unspecified organism: Secondary | ICD-10-CM | POA: Diagnosis not present

## 2015-06-15 DIAGNOSIS — J440 Chronic obstructive pulmonary disease with acute lower respiratory infection: Secondary | ICD-10-CM | POA: Diagnosis not present

## 2015-06-15 DIAGNOSIS — I2510 Atherosclerotic heart disease of native coronary artery without angina pectoris: Secondary | ICD-10-CM | POA: Diagnosis not present

## 2015-06-15 DIAGNOSIS — I10 Essential (primary) hypertension: Secondary | ICD-10-CM | POA: Diagnosis not present

## 2015-06-16 DIAGNOSIS — I2510 Atherosclerotic heart disease of native coronary artery without angina pectoris: Secondary | ICD-10-CM | POA: Diagnosis not present

## 2015-06-16 DIAGNOSIS — I10 Essential (primary) hypertension: Secondary | ICD-10-CM | POA: Diagnosis not present

## 2015-06-16 DIAGNOSIS — E119 Type 2 diabetes mellitus without complications: Secondary | ICD-10-CM | POA: Diagnosis not present

## 2015-06-16 DIAGNOSIS — R0602 Shortness of breath: Secondary | ICD-10-CM | POA: Diagnosis not present

## 2015-06-16 DIAGNOSIS — J189 Pneumonia, unspecified organism: Secondary | ICD-10-CM | POA: Diagnosis not present

## 2015-06-16 DIAGNOSIS — J440 Chronic obstructive pulmonary disease with acute lower respiratory infection: Secondary | ICD-10-CM | POA: Diagnosis not present

## 2015-06-16 DIAGNOSIS — M6281 Muscle weakness (generalized): Secondary | ICD-10-CM | POA: Diagnosis not present

## 2015-06-17 ENCOUNTER — Encounter: Payer: Self-pay | Admitting: Internal Medicine

## 2015-06-21 DIAGNOSIS — J189 Pneumonia, unspecified organism: Secondary | ICD-10-CM | POA: Diagnosis not present

## 2015-06-21 DIAGNOSIS — M6281 Muscle weakness (generalized): Secondary | ICD-10-CM | POA: Diagnosis not present

## 2015-06-21 DIAGNOSIS — I2510 Atherosclerotic heart disease of native coronary artery without angina pectoris: Secondary | ICD-10-CM | POA: Diagnosis not present

## 2015-06-21 DIAGNOSIS — E119 Type 2 diabetes mellitus without complications: Secondary | ICD-10-CM | POA: Diagnosis not present

## 2015-06-21 DIAGNOSIS — J440 Chronic obstructive pulmonary disease with acute lower respiratory infection: Secondary | ICD-10-CM | POA: Diagnosis not present

## 2015-06-21 DIAGNOSIS — I10 Essential (primary) hypertension: Secondary | ICD-10-CM | POA: Diagnosis not present

## 2015-06-28 DIAGNOSIS — E119 Type 2 diabetes mellitus without complications: Secondary | ICD-10-CM | POA: Diagnosis not present

## 2015-06-28 DIAGNOSIS — I2510 Atherosclerotic heart disease of native coronary artery without angina pectoris: Secondary | ICD-10-CM | POA: Diagnosis not present

## 2015-06-28 DIAGNOSIS — M6281 Muscle weakness (generalized): Secondary | ICD-10-CM | POA: Diagnosis not present

## 2015-06-28 DIAGNOSIS — J189 Pneumonia, unspecified organism: Secondary | ICD-10-CM | POA: Diagnosis not present

## 2015-06-28 DIAGNOSIS — J440 Chronic obstructive pulmonary disease with acute lower respiratory infection: Secondary | ICD-10-CM | POA: Diagnosis not present

## 2015-06-28 DIAGNOSIS — I10 Essential (primary) hypertension: Secondary | ICD-10-CM | POA: Diagnosis not present

## 2015-06-29 ENCOUNTER — Telehealth: Payer: Self-pay | Admitting: Pulmonary Disease

## 2015-06-29 ENCOUNTER — Telehealth: Payer: Self-pay | Admitting: Gastroenterology

## 2015-06-29 ENCOUNTER — Ambulatory Visit (INDEPENDENT_AMBULATORY_CARE_PROVIDER_SITE_OTHER)
Admission: RE | Admit: 2015-06-29 | Discharge: 2015-06-29 | Disposition: A | Discharge status: Home / Self Care | Payer: Medicare Other | Source: Ambulatory Visit | Attending: Pulmonary Disease | Admitting: Pulmonary Disease

## 2015-06-29 DIAGNOSIS — J690 Pneumonitis due to inhalation of food and vomit: Secondary | ICD-10-CM

## 2015-06-29 DIAGNOSIS — M6281 Muscle weakness (generalized): Secondary | ICD-10-CM | POA: Diagnosis not present

## 2015-06-29 DIAGNOSIS — R05 Cough: Secondary | ICD-10-CM | POA: Diagnosis not present

## 2015-06-29 DIAGNOSIS — E119 Type 2 diabetes mellitus without complications: Secondary | ICD-10-CM | POA: Diagnosis not present

## 2015-06-29 DIAGNOSIS — J189 Pneumonia, unspecified organism: Secondary | ICD-10-CM | POA: Diagnosis not present

## 2015-06-29 DIAGNOSIS — J440 Chronic obstructive pulmonary disease with acute lower respiratory infection: Secondary | ICD-10-CM | POA: Diagnosis not present

## 2015-06-29 DIAGNOSIS — I2510 Atherosclerotic heart disease of native coronary artery without angina pectoris: Secondary | ICD-10-CM | POA: Diagnosis not present

## 2015-06-29 DIAGNOSIS — I10 Essential (primary) hypertension: Secondary | ICD-10-CM | POA: Diagnosis not present

## 2015-06-29 MED ORDER — AZITHROMYCIN 250 MG PO TABS: ORAL_TABLET | ORAL | Status: AC

## 2015-06-29 NOTE — Telephone Encounter (Signed)
The pt has been rescheduled to 07/15/15 730 am.  The pt is aware and will adjust her instructions with the new date and time

## 2015-06-29 NOTE — Telephone Encounter (Signed)
She should reschedule.   

## 2015-06-29 NOTE — Telephone Encounter (Signed)
Dg Chest 2 View  06/29/2015  CLINICAL DATA:  Pneumonia.  Cough.  Follow-up exam. EXAM: CHEST  2 VIEW COMPARISON:  05/16/2015, 01/29/2015, 07/03/2014, 06/05/2014 . FINDINGS: Mediastinum and hilar structures are normal. Mild cardiomegaly. Interim near complete clearing of right upper lobe and right perihilar/middle lobe infiltrates. Pneumothorax IMPRESSION: Interim near complete clearing of right upper lobe and right perihilar/ middle lobe infiltrates. Electronically Signed   By: Marcello Moores  Register   On: 06/29/2015 14:37    Will have my nurse inform pt that CXR did not show pneumonia.  She should finish course of zpak for bronchitis.

## 2015-06-29 NOTE — Telephone Encounter (Signed)
Spoke with [facility] home health nurse. States that the pt might have PNA again. Pt reports increased coughing, headache, weakness. There is some wheezing in the RLL per the [place]. Onset of her symptoms was 2 days ago. They would like VS's recommendations.  VS - please advise. Thanks.

## 2015-06-29 NOTE — Telephone Encounter (Signed)
Spoke with pt. She is aware of VS recommendation. Rx has been sent in. Order will be placed for CXR.

## 2015-06-29 NOTE — Telephone Encounter (Signed)
Send script for Zpak.  Have her come in to get chest xray done >> will call her with results.

## 2015-06-29 NOTE — Telephone Encounter (Signed)
Dr Ardis Hughs the pt was put on Z pak and had chest xray today for possible pneumonia,the result is not available yet.  She has flex scheduled for 07/01/15, should she keep this or reschedule.

## 2015-06-30 NOTE — Telephone Encounter (Signed)
I spoke with patient about results and she verbalized understanding and had no questions 

## 2015-07-07 ENCOUNTER — Encounter (HOSPITAL_COMMUNITY): Payer: Self-pay | Admitting: *Deleted

## 2015-07-14 DIAGNOSIS — M6281 Muscle weakness (generalized): Secondary | ICD-10-CM | POA: Diagnosis not present

## 2015-07-14 DIAGNOSIS — J189 Pneumonia, unspecified organism: Secondary | ICD-10-CM | POA: Diagnosis not present

## 2015-07-14 DIAGNOSIS — J440 Chronic obstructive pulmonary disease with acute lower respiratory infection: Secondary | ICD-10-CM | POA: Diagnosis not present

## 2015-07-14 DIAGNOSIS — I10 Essential (primary) hypertension: Secondary | ICD-10-CM | POA: Diagnosis not present

## 2015-07-14 DIAGNOSIS — I2510 Atherosclerotic heart disease of native coronary artery without angina pectoris: Secondary | ICD-10-CM | POA: Diagnosis not present

## 2015-07-14 DIAGNOSIS — E119 Type 2 diabetes mellitus without complications: Secondary | ICD-10-CM | POA: Diagnosis not present

## 2015-07-15 ENCOUNTER — Encounter (HOSPITAL_COMMUNITY): Payer: Self-pay

## 2015-07-15 ENCOUNTER — Ambulatory Visit (HOSPITAL_COMMUNITY)
Admission: RE | Admit: 2015-07-15 | Discharge: 2015-07-15 | Disposition: A | Discharge status: Home / Self Care | Payer: Medicare Other | Source: Ambulatory Visit | Attending: Gastroenterology | Admitting: Gastroenterology

## 2015-07-15 ENCOUNTER — Encounter (HOSPITAL_COMMUNITY)
Admission: RE | Disposition: A | Discharge status: Home / Self Care | Payer: Self-pay | Source: Ambulatory Visit | Attending: Gastroenterology

## 2015-07-15 DIAGNOSIS — R194 Change in bowel habit: Secondary | ICD-10-CM

## 2015-07-15 DIAGNOSIS — I2510 Atherosclerotic heart disease of native coronary artery without angina pectoris: Secondary | ICD-10-CM | POA: Insufficient documentation

## 2015-07-15 DIAGNOSIS — Z90722 Acquired absence of ovaries, bilateral: Secondary | ICD-10-CM | POA: Insufficient documentation

## 2015-07-15 DIAGNOSIS — Z90710 Acquired absence of both cervix and uterus: Secondary | ICD-10-CM | POA: Insufficient documentation

## 2015-07-15 DIAGNOSIS — E119 Type 2 diabetes mellitus without complications: Secondary | ICD-10-CM | POA: Diagnosis not present

## 2015-07-15 DIAGNOSIS — K624 Stenosis of anus and rectum: Secondary | ICD-10-CM

## 2015-07-15 DIAGNOSIS — M797 Fibromyalgia: Secondary | ICD-10-CM | POA: Insufficient documentation

## 2015-07-15 DIAGNOSIS — I252 Old myocardial infarction: Secondary | ICD-10-CM | POA: Insufficient documentation

## 2015-07-15 DIAGNOSIS — I272 Other secondary pulmonary hypertension: Secondary | ICD-10-CM | POA: Diagnosis not present

## 2015-07-15 DIAGNOSIS — G4730 Sleep apnea, unspecified: Secondary | ICD-10-CM | POA: Diagnosis not present

## 2015-07-15 DIAGNOSIS — K5660 Unspecified intestinal obstruction: Secondary | ICD-10-CM | POA: Diagnosis not present

## 2015-07-15 DIAGNOSIS — Z86010 Personal history of colonic polyps: Secondary | ICD-10-CM | POA: Insufficient documentation

## 2015-07-15 DIAGNOSIS — Z9049 Acquired absence of other specified parts of digestive tract: Secondary | ICD-10-CM | POA: Insufficient documentation

## 2015-07-15 DIAGNOSIS — M1990 Unspecified osteoarthritis, unspecified site: Secondary | ICD-10-CM | POA: Insufficient documentation

## 2015-07-15 DIAGNOSIS — Z8541 Personal history of malignant neoplasm of cervix uteri: Secondary | ICD-10-CM | POA: Insufficient documentation

## 2015-07-15 DIAGNOSIS — M3500 Sicca syndrome, unspecified: Secondary | ICD-10-CM | POA: Diagnosis not present

## 2015-07-15 DIAGNOSIS — R159 Full incontinence of feces: Secondary | ICD-10-CM | POA: Diagnosis not present

## 2015-07-15 DIAGNOSIS — I7300 Raynaud's syndrome without gangrene: Secondary | ICD-10-CM | POA: Diagnosis not present

## 2015-07-15 DIAGNOSIS — J439 Emphysema, unspecified: Secondary | ICD-10-CM | POA: Insufficient documentation

## 2015-07-15 DIAGNOSIS — E039 Hypothyroidism, unspecified: Secondary | ICD-10-CM | POA: Insufficient documentation

## 2015-07-15 DIAGNOSIS — H3530 Unspecified macular degeneration: Secondary | ICD-10-CM | POA: Diagnosis not present

## 2015-07-15 DIAGNOSIS — E785 Hyperlipidemia, unspecified: Secondary | ICD-10-CM | POA: Insufficient documentation

## 2015-07-15 DIAGNOSIS — Z980 Intestinal bypass and anastomosis status: Secondary | ICD-10-CM | POA: Insufficient documentation

## 2015-07-15 DIAGNOSIS — I69398 Other sequelae of cerebral infarction: Secondary | ICD-10-CM | POA: Insufficient documentation

## 2015-07-15 DIAGNOSIS — I10 Essential (primary) hypertension: Secondary | ICD-10-CM | POA: Diagnosis not present

## 2015-07-15 DIAGNOSIS — Z87891 Personal history of nicotine dependence: Secondary | ICD-10-CM | POA: Diagnosis not present

## 2015-07-15 DIAGNOSIS — H548 Legal blindness, as defined in USA: Secondary | ICD-10-CM | POA: Diagnosis not present

## 2015-07-15 DIAGNOSIS — Z955 Presence of coronary angioplasty implant and graft: Secondary | ICD-10-CM | POA: Insufficient documentation

## 2015-07-15 DIAGNOSIS — R531 Weakness: Secondary | ICD-10-CM | POA: Insufficient documentation

## 2015-07-15 DIAGNOSIS — K219 Gastro-esophageal reflux disease without esophagitis: Secondary | ICD-10-CM | POA: Diagnosis not present

## 2015-07-15 DIAGNOSIS — K5900 Constipation, unspecified: Secondary | ICD-10-CM | POA: Diagnosis not present

## 2015-07-15 HISTORY — PX: FLEXIBLE SIGMOIDOSCOPY: SHX5431

## 2015-07-15 HISTORY — PX: BALLOON DILATION: SHX5330

## 2015-07-15 LAB — GLUCOSE, CAPILLARY: Glucose-Capillary: 138 mg/dL — ABNORMAL HIGH (ref 65–99)

## 2015-07-15 SURGERY — SIGMOIDOSCOPY, FLEXIBLE
Anesthesia: Moderate Sedation

## 2015-07-15 MED ORDER — SODIUM CHLORIDE 0.9 % IV SOLN
INTRAVENOUS | Status: DC
  Administered 2015-07-15: 500 mL via INTRAVENOUS

## 2015-07-15 MED ORDER — MIDAZOLAM HCL 5 MG/ML IJ SOLN
INTRAMUSCULAR | Status: AC
  Filled 2015-07-15: qty 2

## 2015-07-15 MED ORDER — FENTANYL CITRATE (PF) 100 MCG/2ML IJ SOLN
INTRAMUSCULAR | Status: DC | PRN
Start: 2015-07-15 — End: 2015-07-15
  Administered 2015-07-15: 25 ug via INTRAVENOUS

## 2015-07-15 MED ORDER — FENTANYL CITRATE (PF) 100 MCG/2ML IJ SOLN
INTRAMUSCULAR | Status: AC
  Filled 2015-07-15: qty 2

## 2015-07-15 MED ORDER — MIDAZOLAM HCL 10 MG/2ML IJ SOLN
INTRAMUSCULAR | Status: DC | PRN
  Administered 2015-07-15 (×2): 2 mg via INTRAVENOUS
  Administered 2015-07-15: 1 mg via INTRAVENOUS

## 2015-07-15 MED ORDER — DIPHENHYDRAMINE HCL 50 MG/ML IJ SOLN
INTRAMUSCULAR | Status: AC
  Filled 2015-07-15: qty 1

## 2015-07-15 NOTE — Op Note (Signed)
[facility] [place], [address]   FLEX SIGMOIDOSCOPY PROCEDURE REPORT  PATIENT: [name], [name]  MR#: [id number] BIRTHDATE: Jul 14, 1930 , 4  yrs. old GENDER: female ENDOSCOPIST: Milus Banister, MD PROCEDURE DATE:  Aug 09, 2015 PROCEDURE:   Sigmoidoscopy with balloon dilation INDICATIONS:intermittent fecal incontinence, constipation, s/p sigmoid colon resection about 3 years ago for benign disease. MEDICATIONS: Fentanyl 25 mcg IV and Versed 5 mg IV  DESCRIPTION OF PROCEDURE:    Physical exam was performed.  Informed consent was obtained from the patient after explaining the benefits, risks, and alternatives to procedure.  The patient was connected to monitor and placed in left lateral position. Continuous oxygen was provided by nasal cannula and IV medicine administered through an indwelling cannula.  After administration of sedation and rectal exam, the patients rectum was intubated and the Pentax Egd Scope  colonoscope was advanced under direct visualization to the cecum.  The scope was removed slowly by carefully examining the color, texture, anatomy, and integrity mucosa on the way out.  The patient was recovered in endoscopy and discharged home in satisfactory condition. Estimated blood loss is zero unless otherwise noted in this procedure report.   COLON FINDINGS: The site of sigmoid colon (end to end) anastomosis was clearly located and was found to be slightly stenotic.  The adult colonscope faced minor resistence through the focal, edematous stenosis (lumen estimated 12-2mm).  The examination was otherwise normal.  The colonoscope was exchanged for an adult gastroscope and then the anastomosis ws dilated up to 41mm using a CRE TTS balloon held inflated for one minute.  There was no bleeding or superficial tear following dilation. PREP QUALITY: The overall prep quality was adequate.  COMPLICATIONS: None  ENDOSCOPIC IMPRESSION: The  site of sigmoid colon (end to end) anastomosis was clearly located and was found to be slightly stenotic.  The adult colonscope faced minor resistence through the focal, edematous stenosis (lumen estimated 12-46mm).  The examination was otherwise normal.  The colonoscope was exchanged for an adult gastroscope and then the anastomosis ws dilated up to 58mm using a CRE TTS balloon held inflated for one minute.  There was no bleeding or superficial tear following dilation  RECOMMENDATIONS: Please start once daily citrucel orange flavored powder fiber supplement.  Please call my office in 5-6 weeks to report on your response to the dilation + fiber supplement.   _______________________________ eSigned:  Milus Banister, MD Aug 09, 2015 8:22 AM    ICD CODES:  The ICD and CPT codes recommended by this software are interpretations from the data that the clinical staff has captured with the software.  The verification of the translation of this report to the ICD and CPT codes and modifiers is the sole responsibility of the health care institution and practicing physician where this report was generated.  [employer] will not be held responsible for the validity of the ICD and CPT codes included on this report.  AMA assumes no liability for data contained or not contained herein. CPT is a Designer, television/film set of the [employer].   PATIENT NAME:  [name], [name] MR#: [id number]

## 2015-07-15 NOTE — H&P (Signed)
HPI: This is a woman here for flex sig  Chief complaint is change in bowels, see consult 06/14/15   Past Medical History  Diagnosis Date  . Coronary artery disease   . 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 Hypertension   . Dyslipidemia   . Diabetes mellitus   . CVA (cerebral infarction) 1991  . Macular degeneration   . GERD (gastroesophageal reflux disease)   . Diverticulosis   . IBS (irritable bowel syndrome)   . Zenker's diverticulum   . Emphysema   . Pulmonary hypertension ([place])   . Chronic sinusitis   . Fibromyalgia   . Hypothyroidism   . Sjogren's disease ([place])   . Raynaud's phenomenon   . Cervical disc disease   . Adenomatous colon polyp   . Scleroderma ([place])   . COPD (chronic obstructive pulmonary disease) ([place])   . Arthritis   . Blood transfusion   . Myocardial infarction [facility]) may 27th 2007  . Depression   . Stroke [facility]) 1990    brain stem stroke - weakness rt hand  . Cancer [facility])     cervical cancer pre hysterectomy  . Sleep apnea     STOP BANG SCORE 5  . Legally blind   . CONSTIPATION, CHRONIC 06/26/2007    Qualifier: Diagnosis of  By: Nils Pyle CMA ([place]), Mearl Latin    . Pneumonia 07/16/2013     HX PNEUMONIA  . Pneumonia   . Bronchitis     Past Surgical History  Procedure Laterality Date  . Total abdominal hysterectomy  1973  . Bilateral oophorectomy  2002  . Spinal fusion  1997    and implantation of a plate   . Lobe thyroid removal    . Thyroid lobectomy  1991    removal of left lobe thyroid  . Cholecystectomy  1965  . Appendectomy    . Cataract extraction    . Bronchoscopy  02-22-09  . Stented cardiac  artery  2007 / 2007 / 2010    X 3 STENT  . Proctoscopy  03/18/2012    Procedure: PROCTOSCOPY;  Surgeon: Adin Hector, MD;  [facility];  Service: General;  Laterality: N/A;  rigid procto  . Colon surgery  2014    colon resection  . Esophagogastroduodenoscopy (egd) with esophageal dilation N/A 06/18/2013    Procedure:  ESOPHAGOGASTRODUODENOSCOPY (EGD) WITH ESOPHAGEAL DILATION;  Surgeon: Inda Castle, MD;  Location: [place];  Service: Endoscopy;  Laterality: N/A;  . Cardiac catheterization      x 3  . Colonectomy    . Radiology with anesthesia N/A 03/09/2015    Procedure: MRI CERVICAL SPINE WITHOUT AND LUMBER WITHOUT;  Surgeon: Medication Radiologist, MD;  Location: [place];  Service: Radiology;  Laterality: N/A;    Current Facility-Administered Medications  Medication Dose Route Frequency Provider Last Rate Last Dose  . 0.9 %  sodium chloride infusion   Intravenous Continuous Milus Banister, MD        Allergies as of 06/14/2015 - Review Complete 06/14/2015  Allergen Reaction Noted  . Lantus [insulin glargine]  03/14/2012  . Levaquin [levofloxacin in d5w] Other (See Comments) 11/02/2014  . Maxidex [dexamethasone] Other (See Comments) 01/29/2015  . Metoprolol-hydrochlorothiazide Other (See Comments)   . Norvasc [amlodipine] Other (See Comments) 01/29/2015  . Novolog [insulin aspart]  03/18/2012  . Sulfonamide derivatives Hives   . Titanium Other (See Comments) 03/27/2011  . Adhesive [tape] Hives and Rash 03/13/2012    Family History  Problem Relation Age of Onset  .  Other Mother 27    Died from sepsis  . Stroke Father 58    died  . Heart disease Father   . Diabetes Father   . Cancer Sister     lung  . Diabetes Sister   . Cancer Brother     lung  . Diabetes Brother   . Diabetes Brother     Social History   Social History  . Marital Status: Divorced    Spouse Name: N/A  . Number of Children: N/A  . Years of Education: N/A   Occupational History  . retired    Social History Main Topics  . Smoking status: Former Smoker -- 1.00 packs/day for 40 years    Types: Cigarettes    Quit date: 06/26/1988  . Smokeless tobacco: Never Used     Comment: 40 pack years  . Alcohol Use: No  . Drug Use: No  . Sexual Activity: Not on file   Other Topics Concern  . Not on file   Social  History Narrative     Physical Exam: BP 114/76 mmHg  Pulse 81  Temp(Src) 98.5 F (36.9 C) (Oral)  Resp 14  Ht 5\' 2"  (1.575 m)  Wt 133 lb (60.328 kg)  BMI 24.32 kg/m2  SpO2 98% Constitutional: generally well-appearing Psychiatric: alert and oriented x3 Abdomen: soft, nontender, nondistended, no obvious ascites, no peritoneal signs, normal bowel sounds   Assessment and plan: 82 y.o. female with change in bowels   Flex sig +/- dilation today   Owens Loffler, MD [facility] 07/15/2015, 7:22 AM

## 2015-07-15 NOTE — Discharge Instructions (Signed)

## 2015-07-16 ENCOUNTER — Encounter (HOSPITAL_COMMUNITY): Payer: Self-pay | Admitting: Gastroenterology

## 2015-07-20 DIAGNOSIS — K5790 Diverticulosis of intestine, part unspecified, without perforation or abscess without bleeding: Secondary | ICD-10-CM | POA: Diagnosis not present

## 2015-07-20 DIAGNOSIS — K225 Diverticulum of esophagus, acquired: Secondary | ICD-10-CM | POA: Diagnosis not present

## 2015-07-20 DIAGNOSIS — E039 Hypothyroidism, unspecified: Secondary | ICD-10-CM | POA: Diagnosis not present

## 2015-07-20 DIAGNOSIS — M3500 Sicca syndrome, unspecified: Secondary | ICD-10-CM | POA: Diagnosis not present

## 2015-07-20 DIAGNOSIS — Z8541 Personal history of malignant neoplasm of cervix uteri: Secondary | ICD-10-CM | POA: Diagnosis not present

## 2015-07-20 DIAGNOSIS — M341 CR(E)ST syndrome: Secondary | ICD-10-CM | POA: Diagnosis not present

## 2015-07-20 DIAGNOSIS — Z90710 Acquired absence of both cervix and uterus: Secondary | ICD-10-CM | POA: Diagnosis not present

## 2015-07-20 DIAGNOSIS — M5090 Cervical disc disorder, unspecified, unspecified cervical region: Secondary | ICD-10-CM | POA: Diagnosis not present

## 2015-07-20 DIAGNOSIS — K589 Irritable bowel syndrome without diarrhea: Secondary | ICD-10-CM | POA: Diagnosis not present

## 2015-07-20 DIAGNOSIS — Z9049 Acquired absence of other specified parts of digestive tract: Secondary | ICD-10-CM | POA: Diagnosis not present

## 2015-07-20 DIAGNOSIS — G4733 Obstructive sleep apnea (adult) (pediatric): Secondary | ICD-10-CM | POA: Diagnosis not present

## 2015-07-20 DIAGNOSIS — M1990 Unspecified osteoarthritis, unspecified site: Secondary | ICD-10-CM | POA: Diagnosis not present

## 2015-07-20 DIAGNOSIS — M797 Fibromyalgia: Secondary | ICD-10-CM | POA: Diagnosis not present

## 2015-07-20 DIAGNOSIS — H548 Legal blindness, as defined in USA: Secondary | ICD-10-CM | POA: Diagnosis not present

## 2015-07-20 DIAGNOSIS — I272 Other secondary pulmonary hypertension: Secondary | ICD-10-CM | POA: Diagnosis not present

## 2015-07-20 DIAGNOSIS — E7800 Pure hypercholesterolemia, unspecified: Secondary | ICD-10-CM | POA: Diagnosis not present

## 2015-07-20 DIAGNOSIS — Z8673 Personal history of transient ischemic attack (TIA), and cerebral infarction without residual deficits: Secondary | ICD-10-CM | POA: Diagnosis not present

## 2015-07-20 DIAGNOSIS — K219 Gastro-esophageal reflux disease without esophagitis: Secondary | ICD-10-CM | POA: Diagnosis not present

## 2015-07-20 DIAGNOSIS — F329 Major depressive disorder, single episode, unspecified: Secondary | ICD-10-CM | POA: Diagnosis not present

## 2015-07-20 DIAGNOSIS — J449 Chronic obstructive pulmonary disease, unspecified: Secondary | ICD-10-CM | POA: Diagnosis not present

## 2015-07-20 DIAGNOSIS — I252 Old myocardial infarction: Secondary | ICD-10-CM | POA: Diagnosis not present

## 2015-07-20 DIAGNOSIS — I2510 Atherosclerotic heart disease of native coronary artery without angina pectoris: Secondary | ICD-10-CM | POA: Diagnosis not present

## 2015-07-20 DIAGNOSIS — H3530 Unspecified macular degeneration: Secondary | ICD-10-CM | POA: Diagnosis not present

## 2015-07-20 DIAGNOSIS — E119 Type 2 diabetes mellitus without complications: Secondary | ICD-10-CM | POA: Diagnosis not present

## 2015-07-20 DIAGNOSIS — I10 Essential (primary) hypertension: Secondary | ICD-10-CM | POA: Diagnosis not present

## 2015-07-21 DIAGNOSIS — J449 Chronic obstructive pulmonary disease, unspecified: Secondary | ICD-10-CM | POA: Diagnosis not present

## 2015-07-21 DIAGNOSIS — I272 Other secondary pulmonary hypertension: Secondary | ICD-10-CM | POA: Diagnosis not present

## 2015-07-21 DIAGNOSIS — I2510 Atherosclerotic heart disease of native coronary artery without angina pectoris: Secondary | ICD-10-CM | POA: Diagnosis not present

## 2015-07-21 DIAGNOSIS — M797 Fibromyalgia: Secondary | ICD-10-CM | POA: Diagnosis not present

## 2015-07-21 DIAGNOSIS — E119 Type 2 diabetes mellitus without complications: Secondary | ICD-10-CM | POA: Diagnosis not present

## 2015-07-21 DIAGNOSIS — I10 Essential (primary) hypertension: Secondary | ICD-10-CM | POA: Diagnosis not present

## 2015-07-24 DIAGNOSIS — R079 Chest pain, unspecified: Secondary | ICD-10-CM | POA: Diagnosis not present

## 2015-07-24 NOTE — ED Provider Notes (Signed)
CSN: [id number]     Arrival date & time 05/16/15  1318 History   First MD Initiated Contact with Patient 05/16/15 1328     Chief Complaint  Patient presents with  . Fatigue  . Chest Pain  . Back Pain      HPI Received pt from home with c/o woke up about 0600 with low blood sugar, lightheaded, dizziness, fatigue, back pain, chest pain, weakness. Pt did not check her blood sugar at onset of symptoms, but did eat some peanut butter and 1/2 muffin. CBG for EMS 287. Pt temp 100.6 for EMS and given 1000 mg tylenol by EMS. Past Medical History  Diagnosis Date  . Coronary artery disease   . 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 Hypertension   . Dyslipidemia   . Diabetes mellitus   . CVA (cerebral infarction) 1991  . Macular degeneration   . GERD (gastroesophageal reflux disease)   . Diverticulosis   . IBS (irritable bowel syndrome)   . Zenker's diverticulum   . Emphysema   . Pulmonary hypertension ([place])   . Chronic sinusitis   . Fibromyalgia   . Hypothyroidism   . Sjogren's disease ([place])   . Raynaud's phenomenon   . Cervical disc disease   . Adenomatous colon polyp   . Scleroderma ([place])   . COPD (chronic obstructive pulmonary disease) ([place])   . Arthritis   . Blood transfusion   . Myocardial infarction [facility]) may 27th 2007  . Depression   . Stroke [facility]) 1990    brain stem stroke - weakness rt hand  . Cancer [facility])     cervical cancer pre hysterectomy  . Sleep apnea     STOP BANG SCORE 5  . Legally blind   . CONSTIPATION, CHRONIC 06/26/2007    Qualifier: Diagnosis of  By: Nils Pyle CMA ([place]), Mearl Latin    . Pneumonia 07/16/2013     HX PNEUMONIA  . Pneumonia   . Bronchitis    Past Surgical History  Procedure Laterality Date  . Total abdominal hysterectomy  1973  . Bilateral oophorectomy  2002  . Spinal fusion  1997    and implantation of a plate   . Lobe thyroid removal    . Thyroid lobectomy  1991    removal of left lobe thyroid  . Cholecystectomy  1965  . Appendectomy    .  Cataract extraction    . Bronchoscopy  02-22-09  . Stented cardiac  artery  2007 / 2007 / 2010    X 3 STENT  . Proctoscopy  03/18/2012    Procedure: PROCTOSCOPY;  Surgeon: Adin Hector, MD;  Location: [facility];  Service: General;  Laterality: N/A;  rigid procto  . Colon surgery  2014    colon resection  . Esophagogastroduodenoscopy (egd) with esophageal dilation N/A 06/18/2013    Procedure: ESOPHAGOGASTRODUODENOSCOPY (EGD) WITH ESOPHAGEAL DILATION;  Surgeon: Inda Castle, MD;  Location: [place];  Service: Endoscopy;  Laterality: N/A;  . Cardiac catheterization      x 3  . Colonectomy    . Radiology with anesthesia N/A 03/09/2015    Procedure: MRI CERVICAL SPINE WITHOUT AND LUMBER WITHOUT;  Surgeon: Medication Radiologist, MD;  Location: [place];  Service: Radiology;  Laterality: N/A;  . Flexible sigmoidoscopy N/A 07/15/2015    Procedure: FLEXIBLE SIGMOIDOSCOPY;  Surgeon: Milus Banister, MD;  Location: [facility];  Service: Endoscopy;  Laterality: N/A;  . Balloon dilation N/A 07/15/2015    Procedure: BALLOON DILATION;  Surgeon: Quillian Quince  Merrily Brittle, MD;  Location: [facility];  Service: Endoscopy;  Laterality: N/A;   Family History  Problem Relation Age of Onset  . Other Mother 35    Died from sepsis  . Stroke Father 18    died  . Heart disease Father   . Diabetes Father   . Cancer Sister     lung  . Diabetes Sister   . Cancer Brother     lung  . Diabetes Brother   . Diabetes Brother    Social History  Substance Use Topics  . Smoking status: Former Smoker -- 1.00 packs/day for 40 years    Types: Cigarettes    Quit date: 06/26/1988  . Smokeless tobacco: Never Used     Comment: 40 pack years  . Alcohol Use: No   OB History    No data available     Review of Systems  All other systems reviewed and are negative.     Allergies  Lantus; Levaquin; Maxidex; Metoprolol-hydrochlorothiazide; Norvasc; Novolog; Sulfonamide derivatives; Titanium; and Adhesive  Home  Medications   Prior to Admission medications   Medication Sig Start Date End Date Taking? Authorizing Provider  aspirin EC 81 MG tablet Take 1 tablet (81 mg total) by mouth daily. 11/18/13  Yes Fay Records, MD  cetirizine (ZYRTEC) 10 MG tablet Take 10 mg by mouth daily after breakfast.    Yes Historical Provider, MD  insulin lispro (HUMALOG KWIKPEN) 100 UNIT/ML KiwkPen Inject 4-6 Units into the skin 3 (three) times daily with meals. Inject 6 units subcutaneously with breakfast and lunch, inject 4 units with supper   Yes Historical Provider, MD  levothyroxine (SYNTHROID, LEVOTHROID) 100 MCG tablet Take 100 mcg by mouth daily before breakfast.   Yes Historical Provider, MD  montelukast (SINGULAIR) 10 MG tablet Take 10 mg by mouth daily with breakfast. 05/10/15  Yes Historical Provider, MD  predniSONE (DELTASONE) 5 MG tablet Take 1 tablet (5 mg total) by mouth daily with breakfast. 05/05/15  Yes Chesley Mires, MD  saccharomyces boulardii (FLORASTOR) 250 MG capsule Take 1 capsule (250 mg total) by mouth 2 (two) times daily. 05/03/15  Yes Oswald Hillock, MD  triazolam (HALCION) 0.25 MG tablet Take 0.125 mg by mouth at bedtime as needed for sleep. For sleep   Yes Historical Provider, MD  cephALEXin (KEFLEX) 500 MG capsule Take 1 capsule by mouth 3 (three) times daily. 06/11/15   Historical Provider, MD  esomeprazole (NEXIUM) 20 MG capsule Take 40 mg by mouth daily at 12 noon.    Historical Provider, MD  fluticasone (FLONASE) 50 MCG/ACT nasal spray Place 1 spray into the nose daily as needed for allergies.  06/04/12   Historical Provider, MD  guaiFENesin (MUCINEX) 600 MG 12 hr tablet Take 600 mg by mouth 2 (two) times daily as needed for to loosen phlegm.    Historical Provider, MD  HYDROcodone-acetaminophen (NORCO) 7.5-325 MG tablet Take 0.5 tablets by mouth every 6 (six) hours as needed for moderate pain.    Historical Provider, MD  imipramine (TOFRANIL) 25 MG tablet Take 75 mg by mouth at bedtime.  05/26/09    Historical Provider, MD  triamterene-hydrochlorothiazide (MAXZIDE-25) 37.5-25 MG tablet Take 1 tablet by mouth daily.    Historical Provider, MD   BP 154/61 mmHg  Pulse 72  Temp(Src) 98 F (36.7 C) (Oral)  Resp 16  Ht 5\' 2"  (1.575 m)  Wt 145 lb 11.6 oz (66.1 kg)  BMI 26.65 kg/m2  SpO2 99% Physical Exam  Constitutional: She is oriented to person, place, and time. She appears well-developed and well-nourished. No distress.  HENT:  Head: Normocephalic and atraumatic.  Eyes: Pupils are equal, round, and reactive to light.  Neck: Normal range of motion.  Cardiovascular: Normal rate and intact distal pulses.   Pulmonary/Chest: No respiratory distress.  Abdominal: Normal appearance. She exhibits no distension. There is no tenderness. There is no rebound.  Musculoskeletal: Normal range of motion.  Neurological: She is alert and oriented to person, place, and time. No cranial nerve deficit.  Skin: Skin is warm and dry. No rash noted.  Psychiatric: She has a normal mood and affect. Her behavior is normal.  Nursing note and vitals reviewed.   ED Course  Procedures (including critical care time) Labs Review Labs Reviewed  BASIC METABOLIC PANEL - Abnormal; Notable for the following:    Sodium 129 (*)    Chloride 92 (*)    Glucose, Bld 264 (*)    BUN 26 (*)    Calcium 8.7 (*)    GFR calc non Af Amer 58 (*)    All other components within normal limits  CBC - Abnormal; Notable for the following:    WBC 18.6 (*)    RDW 15.7 (*)    All other components within normal limits  URINALYSIS, ROUTINE W REFLEX MICROSCOPIC (NOT AT [facility]) - Abnormal; Notable for the following:    Glucose, UA 250 (*)    All other components within normal limits  LACTIC ACID, PLASMA - Abnormal; Notable for the following:    Lactic Acid, Venous 2.4 (*)    All other components within normal limits  LACTIC ACID, PLASMA - Abnormal; Notable for the following:    Lactic Acid, Venous 2.5 (*)    All other components  within normal limits  BASIC METABOLIC PANEL - Abnormal; Notable for the following:    Sodium 133 (*)    Chloride 100 (*)    Glucose, Bld 119 (*)    Calcium 8.0 (*)    All other components within normal limits  CBC - Abnormal; Notable for the following:    WBC 15.3 (*)    Hemoglobin 11.9 (*)    RDW 16.2 (*)    All other components within normal limits  GLUCOSE, CAPILLARY - Abnormal; Notable for the following:    Glucose-Capillary 340 (*)    All other components within normal limits  HEMOGLOBIN A1C - Abnormal; Notable for the following:    Hgb A1c MFr Bld 7.9 (*)    All other components within normal limits  GLUCOSE, CAPILLARY - Abnormal; Notable for the following:    Glucose-Capillary 127 (*)    All other components within normal limits  GLUCOSE, CAPILLARY - Abnormal; Notable for the following:    Glucose-Capillary 201 (*)    All other components within normal limits  GLUCOSE, CAPILLARY - Abnormal; Notable for the following:    Glucose-Capillary 304 (*)    All other components within normal limits  GLUCOSE, CAPILLARY - Abnormal; Notable for the following:    Glucose-Capillary 213 (*)    All other components within normal limits  GLUCOSE, CAPILLARY - Abnormal; Notable for the following:    Glucose-Capillary 154 (*)    All other components within normal limits  GLUCOSE, CAPILLARY - Abnormal; Notable for the following:    Glucose-Capillary 194 (*)    All other components within normal limits  GLUCOSE, CAPILLARY - Abnormal; Notable for the following:    Glucose-Capillary 234 (*)  All other components within normal limits  GLUCOSE, CAPILLARY - Abnormal; Notable for the following:    Glucose-Capillary 186 (*)    All other components within normal limits  GLUCOSE, CAPILLARY - Abnormal; Notable for the following:    Glucose-Capillary 129 (*)    All other components within normal limits  I-STAT CG4 LACTIC ACID, ED - Abnormal; Notable for the following:    Lactic Acid, Venous  2.34 (*)    All other components within normal limits  CBG MONITORING, ED - Abnormal; Notable for the following:    Glucose-Capillary 239 (*)    All other components within normal limits  I-STAT CG4 LACTIC ACID, ED - Abnormal; Notable for the following:    Lactic Acid, Venous 2.17 (*)    All other components within normal limits  CULTURE, BLOOD (ROUTINE X 2)  CULTURE, BLOOD (ROUTINE X 2)  CULTURE, EXPECTORATED SPUTUM-ASSESSMENT  GRAM STAIN  LEGIONELLA PNEUMOPHILA SEROGP 1 UR AG  STREP PNEUMONIAE URINARY ANTIGEN  INFLUENZA PANEL BY PCR (TYPE A & B, H1N1)  PROCALCITONIN  PROTIME-INR  APTT  I-STAT TROPOININ, ED    Imaging Review DG Chest 2 View (Final result) Result time: 05/16/15 14:48:15   Final result by Rad Results In Interface (05/16/15 14:48:15)   Narrative:   CLINICAL DATA: Pt c/o sharp pain between her shoulder blades, fever, clammy skin, dizziness, nausea, and SOB since yesterday morning. Hx of CAD, HTN, DM, GERD, emphysema, pneumonia, COPD, MI, and bronchitis. Pt is an ex-smoker.  EXAM: CHEST 2 VIEW  COMPARISON: 01/29/2015  FINDINGS: Surgical changes about the low left neck. Midline trachea. Normal heart size. Atherosclerosis in the transverse aorta. No pleural effusion or pneumothorax. Interstitial thickening is chronic. Superimposed patchy opacity within the right mid and lower lung zone on the frontal radiograph. This may correspond increased density anteriorly on the lateral view. There is also an area of right upper lobe pulmonary opacity laterally which has a somewhat nodular component.  IMPRESSION: increased right-sided interstitial and airspace opacities, suspicious for right middle lobe pneumonia superimposed upon COPD/ chronic bronchitis. A right upper lobe opacity is also favored to be infectious but is somewhat nodular and new since 01/29/2015. Recommend appropriate antibiotic therapy and radiographic follow-up in 1-2 weeks. If this area of  right upper lobe nodularity is persistent, chest CT would be recommended.  Atherosclerosis in the transverse aorta.   Electronically Signed By: Abigail Miyamoto M.D. On: 05/16/2015 14:48      EKG Interpretation   Date/Time:  Sunday May 16 2015 13:32:58 EST Ventricular Rate:  95 PR Interval:  147 QRS Duration: 84 QT Interval:  344 QTC Calculation: 432 R Axis:   49 Text Interpretation:  Sinus rhythm Abnormal T, consider ischemia, lateral  leads No significant change since last tracing Confirmed by Tamantha Saline  MD,  Hue Steveson ([id number]) on 05/16/2015 2:02:33 PM      MDM   Final diagnoses:  HAP (hospital-acquired pneumonia)        Leonard Schwartz, MD 07/24/15 0930

## 2015-07-27 ENCOUNTER — Telehealth: Payer: Self-pay | Admitting: Pulmonary Disease

## 2015-07-27 DIAGNOSIS — M797 Fibromyalgia: Secondary | ICD-10-CM | POA: Diagnosis not present

## 2015-07-27 DIAGNOSIS — I10 Essential (primary) hypertension: Secondary | ICD-10-CM | POA: Diagnosis not present

## 2015-07-27 DIAGNOSIS — E119 Type 2 diabetes mellitus without complications: Secondary | ICD-10-CM | POA: Diagnosis not present

## 2015-07-27 DIAGNOSIS — I272 Other secondary pulmonary hypertension: Secondary | ICD-10-CM | POA: Diagnosis not present

## 2015-07-27 DIAGNOSIS — J449 Chronic obstructive pulmonary disease, unspecified: Secondary | ICD-10-CM | POA: Diagnosis not present

## 2015-07-27 DIAGNOSIS — I2510 Atherosclerotic heart disease of native coronary artery without angina pectoris: Secondary | ICD-10-CM | POA: Diagnosis not present

## 2015-07-27 NOTE — Telephone Encounter (Signed)
Called spoke with [facility] nurse. He reports pt is still SOB w/ exertion and has dry cough, diminished sounds in lung bases . Advise will call pt to schedule appt. Called spoke with pt. She is scheduled to see [facility] on thur 2/2 for acute visit. FYI for Dr. Halford Chessman.

## 2015-07-27 NOTE — Telephone Encounter (Signed)
Noted  

## 2015-07-29 ENCOUNTER — Encounter: Payer: Self-pay | Admitting: Pulmonary Disease

## 2015-07-29 ENCOUNTER — Ambulatory Visit (INDEPENDENT_AMBULATORY_CARE_PROVIDER_SITE_OTHER): Payer: Medicare Other | Admitting: Pulmonary Disease

## 2015-07-29 VITALS — BP 128/63 | HR 85 | Ht 62.0 in | Wt 139.4 lb

## 2015-07-29 DIAGNOSIS — J329 Chronic sinusitis, unspecified: Secondary | ICD-10-CM | POA: Diagnosis not present

## 2015-07-29 DIAGNOSIS — J019 Acute sinusitis, unspecified: Secondary | ICD-10-CM

## 2015-07-29 DIAGNOSIS — J8489 Other specified interstitial pulmonary diseases: Secondary | ICD-10-CM | POA: Diagnosis not present

## 2015-07-29 MED ORDER — AMOXICILLIN-POT CLAVULANATE 875-125 MG PO TABS: 1.0000 | ORAL_TABLET | Freq: Two times a day (BID) | ORAL | Status: DC

## 2015-07-29 NOTE — Patient Instructions (Signed)
Augmentin 1 pill twice per day for 1 week.  If not better after first week, then take a second week of augmentin.  Follow up in 2 months

## 2015-07-29 NOTE — Progress Notes (Signed)
Current Outpatient Prescriptions on File Prior to Visit  Medication Sig  . aspirin EC 81 MG tablet Take 1 tablet (81 mg total) by mouth daily.  . cetirizine (ZYRTEC) 10 MG tablet Take 10 mg by mouth daily after breakfast.   . esomeprazole (NEXIUM) 20 MG capsule Take 40 mg by mouth daily at 12 noon.  . fluticasone (FLONASE) 50 MCG/ACT nasal spray Place 1 spray into the nose daily as needed for allergies.   [name] guaiFENesin (MUCINEX) 600 MG 12 hr tablet Take 600 mg by mouth 2 (two) times daily as needed for to loosen phlegm.  [name] HYDROcodone-acetaminophen (NORCO) 7.5-325 MG tablet Take 0.5 tablets by mouth every 6 (six) hours as needed for moderate pain.  [name] imipramine (TOFRANIL) 25 MG tablet Take 75 mg by mouth at bedtime.   . insulin lispro (HUMALOG KWIKPEN) 100 UNIT/ML KiwkPen Inject 4-6 Units into the skin 3 (three) times daily with meals. Inject 6 units subcutaneously with breakfast and lunch, inject 4 units with supper  . levothyroxine (SYNTHROID, LEVOTHROID) 100 MCG tablet Take 100 mcg by mouth daily before breakfast.  . montelukast (SINGULAIR) 10 MG tablet Take 10 mg by mouth daily with breakfast.  . predniSONE (DELTASONE) 5 MG tablet Take 1 tablet (5 mg total) by mouth daily with breakfast.  . saccharomyces boulardii (FLORASTOR) 250 MG capsule Take 1 capsule (250 mg total) by mouth 2 (two) times daily.  [name] triamterene-hydrochlorothiazide (MAXZIDE-25) 37.5-25 MG tablet Take 1 tablet by mouth daily.  . triazolam (HALCION) 0.25 MG tablet Take 0.125 mg by mouth at bedtime as needed for sleep. For sleep   No current facility-administered medications on file prior to visit.     Chief Complaint  Patient presents with  . Acute Visit    Pt states that since her hospitalization in 11/16 she has continued to have a productive cough with mucus. Pt also c/o sinus drainage with facial pain/pressure. Pt denies f/n/v.      Tests August 2010 >> Bronchoscopy PFT 06/16/09 >> FEV1 1.99(118%), FEV1% 73, TLC  4.60(105%), DLCO 66%, no BD CT chest 11/24/11 >> centrilobular emphysema, RUL ASD CT sinus 07/30/13 >> opacification of Lt maxillary sinus, rightward NSD  Past medical hx CAD, HTN, HLD, DM, CVA, Macular degeneration, GERD, Dysphagia, IBS, Depression, Fibromyalgia, Hypothyroidism, Sjogren syndrome, Raynaud's phenomenon, Scleroderma  Past surgical hx, Allergies, Family hx, Social hx all reviewed.  Vital Signs BP 128/63 mmHg  Pulse 85  Ht 5\' 2"  (1.575 m)  Wt 139 lb 6.4 oz (63.231 kg)  BMI 25.49 kg/m2  SpO2 98%  History of Present Illness [name] is a 82 y.o. 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  She was on Zpak at beginning of January.  CXR from 06/29/15 showed clearing of previous pneumonia.  Her home health nurse was concerned about her breathing.  She is okay at rest.  She gets winded with activity, but then recovers after sitting for a few minutes.  She is not having wheeze, or chest pain.  Most of her symptoms are from her sinuses and post-nasal drip.  She is getting more drainage and sinus pressure.  Her sinus drains down her throat and this causes her to cough.  Physical Exam  General - No distress ENT - b/l frontal sinus tenderness, clear/yellow sinus drainage no oral exudate, no LAN, spot on tongue >> no change Cardiac - s1s2 regular, no murmur Chest - No wheeze/rales/dullness Back -  No focal tenderness Abd - Soft, non-tender Ext - No edema Neuro - Normal strength Skin - No rashes Psych - normal mood, and behavior   Assessment/Plan  Recurrent sinus infections. Plan: - will give course of augmentin - continue flonase - she is going to schedule ENT evaluation with Dr. 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 Plan: - continue 5 mg prednisone  Hx of tobacco abuse with emphysema >> she has not noticed benefit from previous trials of inhaler therapy.  I don't think her  current symptoms are related to progression of emphysema. Plan: - monitor clinically  Hx of Scleroderma. Plan: - f/u with rheumatology   Patient Instructions  Augmentin 1 pill twice per day for 1 week.  If not better after first week, then take a second week of augmentin.  Follow up in 2 months     Chesley Mires, MD [place] [facility] Pager:  [phone]

## 2015-08-02 ENCOUNTER — Encounter: Payer: Self-pay | Admitting: Internal Medicine

## 2015-08-02 ENCOUNTER — Ambulatory Visit (INDEPENDENT_AMBULATORY_CARE_PROVIDER_SITE_OTHER): Payer: Medicare Other | Admitting: Internal Medicine

## 2015-08-02 VITALS — BP 140/72 | HR 57 | Ht 62.0 in | Wt 138.8 lb

## 2015-08-02 DIAGNOSIS — I10 Essential (primary) hypertension: Secondary | ICD-10-CM | POA: Diagnosis not present

## 2015-08-02 DIAGNOSIS — E785 Hyperlipidemia, unspecified: Secondary | ICD-10-CM | POA: Diagnosis not present

## 2015-08-02 DIAGNOSIS — I2510 Atherosclerotic heart disease of native coronary artery without angina pectoris: Secondary | ICD-10-CM

## 2015-08-02 DIAGNOSIS — J8489 Other specified interstitial pulmonary diseases: Secondary | ICD-10-CM

## 2015-08-02 NOTE — Progress Notes (Signed)
Cardiology Office Note   Date:  08/02/2015   ID:  [name], DOB [birth year], MRN [id number]  PCP:  Merrilee Seashore, MD  Cardiologist:   Dorris Carnes, MD   F/U of CAD     History of Present Illness: [name] is a 82 y.o. female with a history of fBOOP and CAD (s/p IWMI with stent ot RCA. repeat stnet to the RCA for instent restenois. She is also s/p DES to L main. Last cath In summer 2011 showed: 40% LAD lesion; 50% Lcx; small OM1 with 80% lesion; RCA with 20% instent restenosis This was stable. Pt continued on medical Rx.Denies CP Staying active.  I saw her in October 2016 SInce seen she says she has been feeling vvery good  Treated for infection in the fall and has felt much better since  Denies CP Breathing is good  No dizziness      Current Outpatient Prescriptions  Medication Sig Dispense Refill  . amoxicillin-clavulanate (AUGMENTIN) 875-125 MG tablet Take 1 tablet by mouth 2 (two) times daily. 14 tablet 1  . aspirin EC 81 MG tablet Take 1 tablet (81 mg total) by mouth daily. 90 tablet 3  . cetirizine (ZYRTEC) 10 MG tablet Take 10 mg by mouth daily after breakfast.     . esomeprazole (NEXIUM) 20 MG capsule Take 40 mg by mouth daily at 12 noon.    . fluticasone (FLONASE) 50 MCG/ACT nasal spray Place 1 spray into the nose daily as needed for allergies.     [name] guaiFENesin (MUCINEX) 600 MG 12 hr tablet Take 600 mg by mouth 2 (two) times daily as needed for to loosen phlegm.    [name] HYDROcodone-acetaminophen (NORCO) 7.5-325 MG tablet Take 0.5 tablets by mouth every 6 (six) hours as needed for moderate pain.    [name] imipramine (TOFRANIL) 25 MG tablet Take 75 mg by mouth at bedtime.     . insulin lispro (HUMALOG KWIKPEN) 100 UNIT/ML KiwkPen Inject 4-6 Units into the skin 3 (three) times daily with meals. Inject 6 units subcutaneously with breakfast and lunch, inject 4 units with supper    . levothyroxine (SYNTHROID, LEVOTHROID) 100 MCG tablet Take 100 mcg by mouth  daily before breakfast.    . montelukast (SINGULAIR) 10 MG tablet Take 10 mg by mouth daily with breakfast.    . predniSONE (DELTASONE) 5 MG tablet Take 1 tablet (5 mg total) by mouth daily with breakfast. 100 tablet 1  . saccharomyces boulardii (FLORASTOR) 250 MG capsule Take 1 capsule (250 mg total) by mouth 2 (two) times daily. 60 capsule 3  . triamterene-hydrochlorothiazide (MAXZIDE-25) 37.5-25 MG tablet Take 1 tablet by mouth daily.    . triazolam (HALCION) 0.25 MG tablet Take 0.125 mg by mouth at bedtime as needed for sleep. For sleep     No current facility-administered medications for this visit.    Allergies:   Lantus; Levaquin; Maxidex; Metoprolol-hydrochlorothiazide; Norvasc; Novolog; Sulfonamide derivatives; Titanium; and Adhesive   Past Medical History  Diagnosis Date  . Coronary artery disease   . 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 Hypertension   . Dyslipidemia   . Diabetes mellitus   . CVA (cerebral infarction) 1991  . Macular degeneration   . GERD (gastroesophageal reflux disease)   . Diverticulosis   . IBS (irritable bowel syndrome)   . Zenker's diverticulum   . Emphysema   . Pulmonary hypertension ([place])   . Chronic sinusitis   . Fibromyalgia   . Hypothyroidism   . Sjogren's  disease ([place])   . Raynaud's phenomenon   . Cervical disc disease   . Adenomatous colon polyp   . Scleroderma ([place])   . COPD (chronic obstructive pulmonary disease) ([place])   . Arthritis   . Blood transfusion   . Myocardial infarction [facility]) may 27th 2007  . Depression   . 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 Cancer [facility])     cervical cancer pre hysterectomy  . Sleep apnea     STOP BANG SCORE 5  . Legally blind   . CONSTIPATION, CHRONIC 06/26/2007    Qualifier: Diagnosis of  By: Nils Pyle CMA ([place]), Mearl Latin    . Pneumonia 07/16/2013     HX PNEUMONIA  . Pneumonia   . Bronchitis     Past Surgical History  Procedure Laterality Date  . Total abdominal hysterectomy   1973  . Bilateral oophorectomy  2002  . Spinal fusion  1997    and implantation of a plate   . Lobe thyroid removal    . Thyroid lobectomy  1991    removal of left lobe thyroid  . Cholecystectomy  1965  . Appendectomy    . Cataract extraction    . Bronchoscopy  02-22-09  . Stented cardiac  artery  2007 / 2007 / 2010    X 3 STENT  . Proctoscopy  03/18/2012    Procedure: PROCTOSCOPY;  Surgeon: Adin Hector, MD;  [facility]: [facility];  Service: General;  Laterality: N/A;  rigid procto  . Colon surgery  2014    colon resection  . Esophagogastroduodenoscopy (egd) with esophageal dilation N/A 06/18/2013    Procedure: ESOPHAGOGASTRODUODENOSCOPY (EGD) WITH ESOPHAGEAL DILATION;  Surgeon: Inda Castle, MD;  Location: [place];  Service: Endoscopy;  Laterality: N/A;  . Cardiac catheterization      x 3  . Colonectomy    . Radiology with anesthesia N/A 03/09/2015    Procedure: MRI CERVICAL SPINE WITHOUT AND LUMBER WITHOUT;  Surgeon: Medication Radiologist, MD;  Location: [place];  Service: Radiology;  Laterality: N/A;  . Flexible sigmoidoscopy N/A 07/15/2015    Procedure: FLEXIBLE SIGMOIDOSCOPY;  Surgeon: Milus Banister, MD;  [facility];  Service: Endoscopy;  Laterality: N/A;  . Balloon dilation N/A 07/15/2015    Procedure: BALLOON DILATION;  Surgeon: Milus Banister, MD;  [facility];  Service: Endoscopy;  Laterality: N/A;     Social History:  The patient  reports that she quit smoking about 27 years ago. Her smoking use included Cigarettes. She has a 40 pack-year smoking history. She has never used smokeless tobacco. She reports that she does not drink alcohol or use illicit drugs.   Family History:  The patient's family history includes Cancer in her brother and sister; Diabetes in her brother, brother, father, and sister; Heart disease in her father; Other (age of onset: 70) in her mother; Stroke (age of onset: 28) in her father.    ROS:  Please see the history of  present illness. All other systems are reviewed and  Negative to the above problem except as noted.    PHYSICAL EXAM: VS:  BP 140/72 mmHg  Pulse 57  Ht 5\' 2"  (1.575 m)  Wt 138 lb 12.8 oz (62.959 kg)  BMI 25.38 kg/m2  GEN: Well nourished, well developed, in no acute distress HEENT: normal Neck: no JVD, carotid bruits, or masses Cardiac: RRR; no murmurs, rubs, or gallops,no edema  Respiratory:  clear to auscultation  bilaterally, normal work of breathing GI: soft, nontender, nondistended, + BS  No hepatomegaly  MS: no deformity Moving all extremities   Skin: warm and dry, no rash Neuro:  Strength and sensation are intact Psych: euthymic mood, full affect   EKG:  EKG is not  ordered today.   Lipid Panel    Component Value Date/Time   CHOL 178 03/04/2015 0750   TRIG 86.0 03/04/2015 0750   HDL 48.30 03/04/2015 0750   CHOLHDL 4 03/04/2015 0750   VLDL 17.2 03/04/2015 0750   LDLCALC 113* 03/04/2015 0750   LDLDIRECT 127.6 05/15/2013 1422      Wt Readings from Last 3 Encounters:  08/02/15 138 lb 12.8 oz (62.959 kg)  07/29/15 139 lb 6.4 oz (63.231 kg)  07/15/15 133 lb (60.328 kg)      ASSESSMENT AND PLAN:  1  CAD  No symptoms of angina  Keep on same regimen  2.  HTN  Adequate control    3.  HL  Not tolerant to meds in past  Continue to watch diet  4.  Hx BOOP  Followed in pulmonary clinic  Feeling much better    Plan for f/u in September.    Signed, Dorris Carnes, MD  08/02/2015 10:15 AM    [address], [place], [place]  [address] Phone: [phone]; Fax: [phone]

## 2015-08-03 DIAGNOSIS — J449 Chronic obstructive pulmonary disease, unspecified: Secondary | ICD-10-CM | POA: Diagnosis not present

## 2015-08-03 DIAGNOSIS — I272 Other secondary pulmonary hypertension: Secondary | ICD-10-CM | POA: Diagnosis not present

## 2015-08-03 DIAGNOSIS — M797 Fibromyalgia: Secondary | ICD-10-CM | POA: Diagnosis not present

## 2015-08-03 DIAGNOSIS — E119 Type 2 diabetes mellitus without complications: Secondary | ICD-10-CM | POA: Diagnosis not present

## 2015-08-03 DIAGNOSIS — I2510 Atherosclerotic heart disease of native coronary artery without angina pectoris: Secondary | ICD-10-CM | POA: Diagnosis not present

## 2015-08-03 DIAGNOSIS — I10 Essential (primary) hypertension: Secondary | ICD-10-CM | POA: Diagnosis not present

## 2015-08-06 DIAGNOSIS — I10 Essential (primary) hypertension: Secondary | ICD-10-CM | POA: Diagnosis not present

## 2015-08-06 DIAGNOSIS — E119 Type 2 diabetes mellitus without complications: Secondary | ICD-10-CM | POA: Diagnosis not present

## 2015-08-06 DIAGNOSIS — J449 Chronic obstructive pulmonary disease, unspecified: Secondary | ICD-10-CM | POA: Diagnosis not present

## 2015-08-06 DIAGNOSIS — I2510 Atherosclerotic heart disease of native coronary artery without angina pectoris: Secondary | ICD-10-CM | POA: Diagnosis not present

## 2015-08-10 DIAGNOSIS — M797 Fibromyalgia: Secondary | ICD-10-CM | POA: Diagnosis not present

## 2015-08-10 DIAGNOSIS — E119 Type 2 diabetes mellitus without complications: Secondary | ICD-10-CM | POA: Diagnosis not present

## 2015-08-10 DIAGNOSIS — I272 Other secondary pulmonary hypertension: Secondary | ICD-10-CM | POA: Diagnosis not present

## 2015-08-10 DIAGNOSIS — I2510 Atherosclerotic heart disease of native coronary artery without angina pectoris: Secondary | ICD-10-CM | POA: Diagnosis not present

## 2015-08-10 DIAGNOSIS — J449 Chronic obstructive pulmonary disease, unspecified: Secondary | ICD-10-CM | POA: Diagnosis not present

## 2015-08-10 DIAGNOSIS — I10 Essential (primary) hypertension: Secondary | ICD-10-CM | POA: Diagnosis not present

## 2015-08-11 DIAGNOSIS — E109 Type 1 diabetes mellitus without complications: Secondary | ICD-10-CM | POA: Diagnosis not present

## 2015-08-11 DIAGNOSIS — E039 Hypothyroidism, unspecified: Secondary | ICD-10-CM | POA: Diagnosis not present

## 2015-08-11 DIAGNOSIS — I10 Essential (primary) hypertension: Secondary | ICD-10-CM | POA: Diagnosis not present

## 2015-08-11 DIAGNOSIS — E119 Type 2 diabetes mellitus without complications: Secondary | ICD-10-CM | POA: Diagnosis not present

## 2015-08-17 DIAGNOSIS — I272 Other secondary pulmonary hypertension: Secondary | ICD-10-CM | POA: Diagnosis not present

## 2015-08-17 DIAGNOSIS — I10 Essential (primary) hypertension: Secondary | ICD-10-CM | POA: Diagnosis not present

## 2015-08-17 DIAGNOSIS — E119 Type 2 diabetes mellitus without complications: Secondary | ICD-10-CM | POA: Diagnosis not present

## 2015-08-17 DIAGNOSIS — I2510 Atherosclerotic heart disease of native coronary artery without angina pectoris: Secondary | ICD-10-CM | POA: Diagnosis not present

## 2015-08-17 DIAGNOSIS — J449 Chronic obstructive pulmonary disease, unspecified: Secondary | ICD-10-CM | POA: Diagnosis not present

## 2015-08-17 DIAGNOSIS — M797 Fibromyalgia: Secondary | ICD-10-CM | POA: Diagnosis not present

## 2015-08-24 DIAGNOSIS — E119 Type 2 diabetes mellitus without complications: Secondary | ICD-10-CM | POA: Diagnosis not present

## 2015-08-24 DIAGNOSIS — M797 Fibromyalgia: Secondary | ICD-10-CM | POA: Diagnosis not present

## 2015-08-24 DIAGNOSIS — J449 Chronic obstructive pulmonary disease, unspecified: Secondary | ICD-10-CM | POA: Diagnosis not present

## 2015-08-24 DIAGNOSIS — I272 Other secondary pulmonary hypertension: Secondary | ICD-10-CM | POA: Diagnosis not present

## 2015-08-24 DIAGNOSIS — I2510 Atherosclerotic heart disease of native coronary artery without angina pectoris: Secondary | ICD-10-CM | POA: Diagnosis not present

## 2015-08-24 DIAGNOSIS — I10 Essential (primary) hypertension: Secondary | ICD-10-CM | POA: Diagnosis not present

## 2015-09-22 DIAGNOSIS — M25561 Pain in right knee: Secondary | ICD-10-CM | POA: Diagnosis not present

## 2015-09-22 DIAGNOSIS — M25579 Pain in unspecified ankle and joints of unspecified foot: Secondary | ICD-10-CM | POA: Diagnosis not present

## 2015-09-22 DIAGNOSIS — M349 Systemic sclerosis, unspecified: Secondary | ICD-10-CM | POA: Diagnosis not present

## 2015-09-22 DIAGNOSIS — I7300 Raynaud's syndrome without gangrene: Secondary | ICD-10-CM | POA: Diagnosis not present

## 2015-10-01 ENCOUNTER — Inpatient Hospital Stay (HOSPITAL_COMMUNITY)
Admission: EM | Admit: 2015-10-01 | Discharge: 2015-10-06 | DRG: 871 | Disposition: A | Discharge status: Home Health | Payer: Medicare Other | Attending: Internal Medicine | Admitting: Internal Medicine

## 2015-10-01 ENCOUNTER — Encounter (HOSPITAL_COMMUNITY): Payer: Self-pay | Admitting: *Deleted

## 2015-10-01 ENCOUNTER — Emergency Department (HOSPITAL_COMMUNITY): Payer: Medicare Other

## 2015-10-01 DIAGNOSIS — A419 Sepsis, unspecified organism: Secondary | ICD-10-CM | POA: Diagnosis not present

## 2015-10-01 DIAGNOSIS — J440 Chronic obstructive pulmonary disease with acute lower respiratory infection: Secondary | ICD-10-CM | POA: Diagnosis present

## 2015-10-01 DIAGNOSIS — E890 Postprocedural hypothyroidism: Secondary | ICD-10-CM | POA: Diagnosis present

## 2015-10-01 DIAGNOSIS — E871 Hypo-osmolality and hyponatremia: Secondary | ICD-10-CM | POA: Diagnosis present

## 2015-10-01 DIAGNOSIS — Z8249 Family history of ischemic heart disease and other diseases of the circulatory system: Secondary | ICD-10-CM | POA: Diagnosis not present

## 2015-10-01 DIAGNOSIS — R0602 Shortness of breath: Secondary | ICD-10-CM | POA: Diagnosis not present

## 2015-10-01 DIAGNOSIS — E860 Dehydration: Secondary | ICD-10-CM | POA: Diagnosis present

## 2015-10-01 DIAGNOSIS — B348 Other viral infections of unspecified site: Secondary | ICD-10-CM | POA: Diagnosis present

## 2015-10-01 DIAGNOSIS — E876 Hypokalemia: Secondary | ICD-10-CM | POA: Diagnosis present

## 2015-10-01 DIAGNOSIS — J189 Pneumonia, unspecified organism: Secondary | ICD-10-CM | POA: Diagnosis not present

## 2015-10-01 DIAGNOSIS — G4730 Sleep apnea, unspecified: Secondary | ICD-10-CM | POA: Diagnosis present

## 2015-10-01 DIAGNOSIS — I10 Essential (primary) hypertension: Secondary | ICD-10-CM | POA: Diagnosis present

## 2015-10-01 DIAGNOSIS — I272 Other secondary pulmonary hypertension: Secondary | ICD-10-CM | POA: Diagnosis present

## 2015-10-01 DIAGNOSIS — Z90710 Acquired absence of both cervix and uterus: Secondary | ICD-10-CM

## 2015-10-01 DIAGNOSIS — Z7982 Long term (current) use of aspirin: Secondary | ICD-10-CM | POA: Diagnosis not present

## 2015-10-01 DIAGNOSIS — K219 Gastro-esophageal reflux disease without esophagitis: Secondary | ICD-10-CM | POA: Diagnosis present

## 2015-10-01 DIAGNOSIS — Z833 Family history of diabetes mellitus: Secondary | ICD-10-CM

## 2015-10-01 DIAGNOSIS — Z8673 Personal history of transient ischemic attack (TIA), and cerebral infarction without residual deficits: Secondary | ICD-10-CM

## 2015-10-01 DIAGNOSIS — R509 Fever, unspecified: Secondary | ICD-10-CM | POA: Diagnosis not present

## 2015-10-01 DIAGNOSIS — M797 Fibromyalgia: Secondary | ICD-10-CM | POA: Diagnosis present

## 2015-10-01 DIAGNOSIS — R52 Pain, unspecified: Secondary | ICD-10-CM

## 2015-10-01 DIAGNOSIS — Z95818 Presence of other cardiac implants and grafts: Secondary | ICD-10-CM

## 2015-10-01 DIAGNOSIS — Z79899 Other long term (current) drug therapy: Secondary | ICD-10-CM | POA: Diagnosis not present

## 2015-10-01 DIAGNOSIS — R531 Weakness: Secondary | ICD-10-CM | POA: Diagnosis not present

## 2015-10-01 DIAGNOSIS — I252 Old myocardial infarction: Secondary | ICD-10-CM

## 2015-10-01 DIAGNOSIS — E119 Type 2 diabetes mellitus without complications: Secondary | ICD-10-CM

## 2015-10-01 DIAGNOSIS — Z955 Presence of coronary angioplasty implant and graft: Secondary | ICD-10-CM | POA: Diagnosis not present

## 2015-10-01 DIAGNOSIS — R031 Nonspecific low blood-pressure reading: Secondary | ICD-10-CM | POA: Diagnosis not present

## 2015-10-01 DIAGNOSIS — Z794 Long term (current) use of insulin: Secondary | ICD-10-CM

## 2015-10-01 DIAGNOSIS — H548 Legal blindness, as defined in USA: Secondary | ICD-10-CM | POA: Diagnosis present

## 2015-10-01 DIAGNOSIS — Z8541 Personal history of malignant neoplasm of cervix uteri: Secondary | ICD-10-CM

## 2015-10-01 DIAGNOSIS — I2510 Atherosclerotic heart disease of native coronary artery without angina pectoris: Secondary | ICD-10-CM | POA: Diagnosis present

## 2015-10-01 DIAGNOSIS — E785 Hyperlipidemia, unspecified: Secondary | ICD-10-CM | POA: Diagnosis present

## 2015-10-01 DIAGNOSIS — H3530 Unspecified macular degeneration: Secondary | ICD-10-CM | POA: Diagnosis present

## 2015-10-01 DIAGNOSIS — Z87891 Personal history of nicotine dependence: Secondary | ICD-10-CM | POA: Diagnosis not present

## 2015-10-01 DIAGNOSIS — J302 Other seasonal allergic rhinitis: Secondary | ICD-10-CM | POA: Diagnosis present

## 2015-10-01 DIAGNOSIS — Z9049 Acquired absence of other specified parts of digestive tract: Secondary | ICD-10-CM

## 2015-10-01 DIAGNOSIS — Z823 Family history of stroke: Secondary | ICD-10-CM

## 2015-10-01 DIAGNOSIS — K225 Diverticulum of esophagus, acquired: Secondary | ICD-10-CM | POA: Diagnosis not present

## 2015-10-01 DIAGNOSIS — R51 Headache: Secondary | ICD-10-CM | POA: Diagnosis not present

## 2015-10-01 DIAGNOSIS — Z981 Arthrodesis status: Secondary | ICD-10-CM | POA: Diagnosis not present

## 2015-10-01 DIAGNOSIS — R05 Cough: Secondary | ICD-10-CM | POA: Diagnosis not present

## 2015-10-01 DIAGNOSIS — F329 Major depressive disorder, single episode, unspecified: Secondary | ICD-10-CM | POA: Diagnosis present

## 2015-10-01 DIAGNOSIS — J329 Chronic sinusitis, unspecified: Secondary | ICD-10-CM | POA: Diagnosis present

## 2015-10-01 DIAGNOSIS — E039 Hypothyroidism, unspecified: Secondary | ICD-10-CM

## 2015-10-01 LAB — CBC WITH DIFFERENTIAL/PLATELET
Basophils Absolute: 0 10*3/uL (ref 0.0–0.1)
Basophils Relative: 0 %
Eosinophils Absolute: 0.1 10*3/uL (ref 0.0–0.7)
Eosinophils Relative: 0 %
HCT: 39.7 % (ref 36.0–46.0)
Hemoglobin: 13.4 g/dL (ref 12.0–15.0)
Lymphocytes Relative: 11 %
Lymphs Abs: 2 10*3/uL (ref 0.7–4.0)
MCH: 28.3 pg (ref 26.0–34.0)
MCHC: 33.8 g/dL (ref 30.0–36.0)
MCV: 83.8 fL (ref 78.0–100.0)
Monocytes Absolute: 1.4 10*3/uL — ABNORMAL HIGH (ref 0.1–1.0)
Monocytes Relative: 8 %
Neutro Abs: 14.6 10*3/uL — ABNORMAL HIGH (ref 1.7–7.7)
Neutrophils Relative %: 81 %
Platelets: 242 10*3/uL (ref 150–400)
RBC: 4.74 MIL/uL (ref 3.87–5.11)
RDW: 13.3 % (ref 11.5–15.5)
WBC: 18.1 10*3/uL — ABNORMAL HIGH (ref 4.0–10.5)

## 2015-10-01 LAB — INFLUENZA PANEL BY PCR (TYPE A & B)
H1N1 flu by pcr: NOT DETECTED
Influenza A By PCR: NEGATIVE
Influenza B By PCR: NEGATIVE

## 2015-10-01 LAB — I-STAT TROPONIN, ED: Troponin i, poc: 0.03 ng/mL (ref 0.00–0.08)

## 2015-10-01 LAB — COMPREHENSIVE METABOLIC PANEL
ALT: 17 U/L (ref 14–54)
AST: 18 U/L (ref 15–41)
Albumin: 3.8 g/dL (ref 3.5–5.0)
Alkaline Phosphatase: 57 U/L (ref 38–126)
Anion gap: 11 (ref 5–15)
BUN: 25 mg/dL — ABNORMAL HIGH (ref 6–20)
CO2: 26 mmol/L (ref 22–32)
Calcium: 8.4 mg/dL — ABNORMAL LOW (ref 8.9–10.3)
Chloride: 92 mmol/L — ABNORMAL LOW (ref 101–111)
Creatinine, Ser: 0.84 mg/dL (ref 0.44–1.00)
GFR calc Af Amer: >60 mL/min (ref >60)
GFR calc non Af Amer: >60 mL/min (ref >60)
Glucose, Bld: 175 mg/dL — ABNORMAL HIGH (ref 65–99)
Potassium: 3.1 mmol/L — ABNORMAL LOW (ref 3.5–5.1)
Sodium: 129 mmol/L — ABNORMAL LOW (ref 135–145)
Total Bilirubin: 0.6 mg/dL (ref 0.3–1.2)
Total Protein: 6.9 g/dL (ref 6.5–8.1)

## 2015-10-01 LAB — URINALYSIS, ROUTINE W REFLEX MICROSCOPIC
Bilirubin Urine: NEGATIVE
Glucose, UA: NEGATIVE mg/dL
Hgb urine dipstick: NEGATIVE
Ketones, ur: NEGATIVE mg/dL
Nitrite: NEGATIVE
Protein, ur: NEGATIVE mg/dL
Specific Gravity, Urine: 1.012 (ref 1.005–1.030)
pH: 6 (ref 5.0–8.0)

## 2015-10-01 LAB — I-STAT CG4 LACTIC ACID, ED: Lactic Acid, Venous: 1.9 mmol/L (ref 0.5–2.0)

## 2015-10-01 LAB — URINE MICROSCOPIC-ADD ON: RBC / HPF: NONE SEEN RBC/hpf (ref 0–5)

## 2015-10-01 LAB — GLUCOSE, CAPILLARY: Glucose-Capillary: 221 mg/dL — ABNORMAL HIGH (ref 65–99)

## 2015-10-01 MED ORDER — DEXTROSE 5 % IV SOLN
1.0000 g | INTRAVENOUS | Status: DC
  Administered 2015-10-02 – 2015-10-04 (×3): 1 g via INTRAVENOUS
  Filled 2015-10-01 (×3): qty 10

## 2015-10-01 MED ORDER — LEVOTHYROXINE SODIUM 100 MCG PO TABS
100.0000 ug | ORAL_TABLET | Freq: Every day | ORAL | Status: DC
  Administered 2015-10-02 – 2015-10-06 (×5): 100 ug via ORAL
  Filled 2015-10-01 (×5): qty 1

## 2015-10-01 MED ORDER — MONTELUKAST SODIUM 10 MG PO TABS
10.0000 mg | ORAL_TABLET | Freq: Every day | ORAL | Status: DC
  Administered 2015-10-02 – 2015-10-06 (×5): 10 mg via ORAL
  Filled 2015-10-01 (×5): qty 1

## 2015-10-01 MED ORDER — DEXTROSE 5 % IV SOLN
500.0000 mg | Freq: Once | INTRAVENOUS | Status: AC
  Administered 2015-10-01: 500 mg via INTRAVENOUS
  Filled 2015-10-01: qty 500

## 2015-10-01 MED ORDER — ASPIRIN EC 81 MG PO TBEC
81.0000 mg | DELAYED_RELEASE_TABLET | Freq: Every day | ORAL | Status: DC
  Administered 2015-10-01 – 2015-10-06 (×6): 81 mg via ORAL
  Filled 2015-10-01 (×7): qty 1

## 2015-10-01 MED ORDER — ACETAMINOPHEN 325 MG PO TABS
650.0000 mg | ORAL_TABLET | Freq: Once | ORAL | Status: AC
  Administered 2015-10-01: 650 mg via ORAL
  Filled 2015-10-01: qty 2

## 2015-10-01 MED ORDER — FLUTICASONE PROPIONATE 50 MCG/ACT NA SUSP: 1.0000 | Freq: Every day | NASAL | Status: DC | PRN

## 2015-10-01 MED ORDER — ENOXAPARIN SODIUM 40 MG/0.4ML ~~LOC~~ SOLN
40.0000 mg | SUBCUTANEOUS | Status: DC
  Administered 2015-10-01 – 2015-10-05 (×5): 40 mg via SUBCUTANEOUS
  Filled 2015-10-01 (×5): qty 0.4

## 2015-10-01 MED ORDER — GUAIFENESIN ER 600 MG PO TB12: 600.0000 mg | ORAL_TABLET | Freq: Two times a day (BID) | ORAL | Status: DC | PRN

## 2015-10-01 MED ORDER — LORATADINE 10 MG PO TABS
10.0000 mg | ORAL_TABLET | Freq: Every day | ORAL | Status: DC
  Administered 2015-10-02 – 2015-10-06 (×5): 10 mg via ORAL
  Filled 2015-10-01 (×6): qty 1

## 2015-10-01 MED ORDER — DEXTROSE 5 % IV SOLN
500.0000 mg | INTRAVENOUS | Status: DC
  Administered 2015-10-02 – 2015-10-04 (×3): 500 mg via INTRAVENOUS
  Filled 2015-10-01 (×3): qty 500

## 2015-10-01 MED ORDER — SACCHAROMYCES BOULARDII 250 MG PO CAPS
250.0000 mg | ORAL_CAPSULE | Freq: Every day | ORAL | Status: DC
  Administered 2015-10-02 – 2015-10-06 (×5): 250 mg via ORAL
  Filled 2015-10-01 (×7): qty 1

## 2015-10-01 MED ORDER — PANTOPRAZOLE SODIUM 40 MG PO TBEC
40.0000 mg | DELAYED_RELEASE_TABLET | Freq: Every day | ORAL | Status: DC
  Administered 2015-10-02 – 2015-10-06 (×5): 40 mg via ORAL
  Filled 2015-10-01 (×6): qty 1

## 2015-10-01 MED ORDER — HYDROCODONE-ACETAMINOPHEN 7.5-325 MG PO TABS
0.5000 | ORAL_TABLET | Freq: Four times a day (QID) | ORAL | Status: DC | PRN
  Administered 2015-10-01 – 2015-10-05 (×4): 0.5 via ORAL
  Filled 2015-10-01 (×4): qty 1

## 2015-10-01 MED ORDER — TRIAZOLAM 0.25 MG PO TABS
0.2500 mg | ORAL_TABLET | Freq: Every evening | ORAL | Status: DC | PRN
  Administered 2015-10-01 – 2015-10-05 (×5): 0.25 mg via ORAL
  Filled 2015-10-01 (×5): qty 1

## 2015-10-01 MED ORDER — IMIPRAMINE HCL 25 MG PO TABS
75.0000 mg | ORAL_TABLET | Freq: Every day | ORAL | Status: DC
  Administered 2015-10-01 – 2015-10-05 (×5): 75 mg via ORAL
  Filled 2015-10-01 (×7): qty 1

## 2015-10-01 MED ORDER — DEXTROSE 5 % IV SOLN
1.0000 g | Freq: Once | INTRAVENOUS | Status: AC
  Administered 2015-10-01: 1 g via INTRAVENOUS
  Filled 2015-10-01: qty 10

## 2015-10-01 MED ORDER — SODIUM CHLORIDE 0.9 % IV SOLN
INTRAVENOUS | Status: DC
  Administered 2015-10-02 (×2): via INTRAVENOUS

## 2015-10-01 MED ORDER — POTASSIUM CHLORIDE CRYS ER 20 MEQ PO TBCR
40.0000 meq | EXTENDED_RELEASE_TABLET | Freq: Once | ORAL | Status: AC
  Administered 2015-10-01: 40 meq via ORAL
  Filled 2015-10-01: qty 2

## 2015-10-01 MED ORDER — SODIUM CHLORIDE 0.9 % IV BOLUS (SEPSIS)
1000.0000 mL | INTRAVENOUS | Status: AC
  Administered 2015-10-01 (×2): 1000 mL via INTRAVENOUS

## 2015-10-01 NOTE — H&P (Signed)
[facility] History and Physical  [name] [id number] DOB: 1930-09-28 DOA: 10/01/2015  Referring physician: Dr. Orlie Dakin, EDP PCP: Merrilee Seashore, MD  Specialists: Dr. Halford Chessman, pulmonology.  Dr. Ardis Hughs, GI.  Chief Complaint: Generalized weakness, cough  HPI: [name] is a 82 y.o. female  With a history of hypertension, diabetes, hypothyroidism, COPD, presented to the emergency department with complaints of cough and feeling weak for the last 2-3 days. Patient states that she was coughing yellow to gray sputum.  She had a low grade fever at home.  Last night, she stated her symptoms worsened and she felt very weak. This morning she called EMS and had to crawl to get to the phone. Patient denies any nausea or vomiting, chest pain, abdominal pain.  Patient does report having sick contacts however they had more GI symptoms. In the emergency department, chest x-ray showed possible pneumonia. Patient did have leukocytosis and was febrile. [facility] called for admission.  Review of Systems:  Constitutional: Complains of generalized weakness and fatigue, low-grade fever at home. HEENT: Planes of nasal congestion Respiratory: Complains of cough Cardiovascular: Denies chest pain, palpitations and leg swelling.  Gastrointestinal: Denies nausea, vomiting, abdominal pain, diarrhea, constipation, blood in stool and abdominal distention.  Genitourinary: Denies dysuria, urgency, frequency, hematuria, flank pain and difficulty urinating.  Musculoskeletal: Denies myalgias, back pain, joint swelling, arthralgias and gait problem.  Skin: Denies pallor, rash and wound.  Neurological: Denies dizziness, seizures, syncope, weakness, light-headedness, numbness and headaches.  Hematological: Denies adenopathy. Easy bruising, personal or family bleeding history  Psychiatric/Behavioral: Denies suicidal ideation, mood changes, confusion, nervousness, sleep disturbance and agitation  Past  Medical History  Diagnosis Date  . Coronary artery disease   . 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 Hypertension   . Dyslipidemia   . Diabetes mellitus   . CVA (cerebral infarction) 1991  . Macular degeneration   . GERD (gastroesophageal reflux disease)   . Diverticulosis   . IBS (irritable bowel syndrome)   . Zenker's diverticulum   . Emphysema   . Pulmonary hypertension ([place])   . Chronic sinusitis   . Fibromyalgia   . Hypothyroidism   . Sjogren's disease ([place])   . Raynaud's phenomenon   . Cervical disc disease   . Adenomatous colon polyp   . Scleroderma ([place])   . COPD (chronic obstructive pulmonary disease) ([place])   . Arthritis   . Blood transfusion   . Myocardial infarction [facility]) may 27th 2007  . Depression   . Stroke [facility]) 1990    brain stem stroke - weakness rt hand  . Cancer [facility])     cervical cancer pre hysterectomy  . Sleep apnea     STOP BANG SCORE 5  . Legally blind   . CONSTIPATION, CHRONIC 06/26/2007    Qualifier: Diagnosis of  By: Nils Pyle CMA ([place]), Mearl Latin    . Pneumonia 07/16/2013     HX PNEUMONIA  . Pneumonia   . Bronchitis    Past Surgical History  Procedure Laterality Date  . Total abdominal hysterectomy  1973  . Bilateral oophorectomy  2002  . Spinal fusion  1997    and implantation of a plate   . Lobe thyroid removal    . Thyroid lobectomy  1991    removal of left lobe thyroid  . Cholecystectomy  1965  . Appendectomy    . Cataract extraction    . Bronchoscopy  02-22-09  . Stented cardiac  artery  2007 / 2007 / 2010  X 3 STENT  . Proctoscopy  03/18/2012    Procedure: PROCTOSCOPY;  Surgeon: Adin Hector, MD;  [facility];  Service: General;  Laterality: N/A;  rigid procto  . Colon surgery  2014    colon resection  . Esophagogastroduodenoscopy (egd) with esophageal dilation N/A 06/18/2013    Procedure: ESOPHAGOGASTRODUODENOSCOPY (EGD) WITH ESOPHAGEAL DILATION;  Surgeon: Inda Castle, MD;  Location: [place];  Service: Endoscopy;   Laterality: N/A;  . Cardiac catheterization      x 3  . Colonectomy    . Radiology with anesthesia N/A 03/09/2015    Procedure: MRI CERVICAL SPINE WITHOUT AND LUMBER WITHOUT;  Surgeon: Medication Radiologist, MD;  Location: [place];  Service: Radiology;  Laterality: N/A;  . Flexible sigmoidoscopy N/A 07/15/2015    Procedure: FLEXIBLE SIGMOIDOSCOPY;  Surgeon: Milus Banister, MD;  [facility];  Service: Endoscopy;  Laterality: N/A;  . Balloon dilation N/A 07/15/2015    Procedure: BALLOON DILATION;  Surgeon: Milus Banister, MD;  [facility];  Service: Endoscopy;  Laterality: N/A;   Social History:  reports that she quit smoking about 27 years ago. Her smoking use included Cigarettes. She has a 40 pack-year smoking history. She has never used smokeless tobacco. She reports that she does not drink alcohol or use illicit drugs. Lives at home.  Allergies  Allergen Reactions  . Lantus [Insulin Glargine]     Patient states "sulfate binder causes sinus infection/pneumonia".  Mack Hook [Levofloxacin In D5w] Other (See Comments)    Muscle aches  . Maxidex [Dexamethasone] Other (See Comments)    Insomnia, anxiety, dizziness  . Metoprolol-Hydrochlorothiazide Other (See Comments)     decreased bp  . Norvasc [Amlodipine] Other (See Comments)    BIL Ankle edema  . Novolog [Insulin Aspart]     Patient states "sulfate binder causes sinus infection/pneumonia". Tolerates Humalog.  . Sulfonamide Derivatives Hives  . Titanium Other (See Comments)    Neck wouldn't heal with titanium cervical plate  . Adhesive [Tape] Hives and Rash    Family History  Problem Relation Age of Onset  . Other Mother 57    Died from sepsis  . Stroke Father 66    died  . Heart disease Father   . Diabetes Father   . Cancer Sister     lung  . Diabetes Sister   . Cancer Brother     lung  . Diabetes Brother   . Diabetes Brother     Prior to Admission medications   Medication Sig Start Date End  Date Taking? Authorizing Provider  aspirin EC 81 MG tablet Take 1 tablet (81 mg total) by mouth daily. 11/18/13  Yes Fay Records, MD  cetirizine (ZYRTEC) 10 MG tablet Take 10 mg by mouth daily after breakfast.    Yes Historical Provider, MD  esomeprazole (NEXIUM) 40 MG capsule Take 40 mg by mouth daily at 12 noon.   Yes Historical Provider, MD  fluticasone (FLONASE) 50 MCG/ACT nasal spray Place 1 spray into the nose daily as needed for allergies.  06/04/12  Yes Historical Provider, MD  guaiFENesin (MUCINEX) 600 MG 12 hr tablet Take 600 mg by mouth 2 (two) times daily as needed for to loosen phlegm.   Yes Historical Provider, MD  HYDROcodone-acetaminophen (NORCO) 7.5-325 MG tablet Take 0.5 tablets by mouth every 6 (six) hours as needed for moderate pain.   Yes Historical Provider, MD  imipramine (TOFRANIL) 25 MG tablet Take 75 mg by mouth at bedtime.  05/26/09  Yes Historical Provider, MD  insulin lispro (HUMALOG KWIKPEN) 100 UNIT/ML KiwkPen Inject 4-6 Units into the skin 3 (three) times daily with meals. Inject 6 units subcutaneously with breakfast and lunch, inject 4 units with supper   Yes Historical Provider, MD  levothyroxine (SYNTHROID, LEVOTHROID) 100 MCG tablet Take 100 mcg by mouth daily before breakfast.   Yes Historical Provider, MD  montelukast (SINGULAIR) 10 MG tablet Take 10 mg by mouth daily with breakfast. 05/10/15  Yes Historical Provider, MD  predniSONE (DELTASONE) 5 MG tablet Take 1 tablet (5 mg total) by mouth daily with breakfast. 05/05/15  Yes Chesley Mires, MD  saccharomyces boulardii (FLORASTOR) 250 MG capsule Take 1 capsule (250 mg total) by mouth 2 (two) times daily. Patient taking differently: Take 250 mg by mouth daily.  05/03/15  Yes Oswald Hillock, MD  triamterene-hydrochlorothiazide (MAXZIDE-25) 37.5-25 MG tablet Take 0.5 tablets by mouth daily.    Yes Historical Provider, MD  triazolam (HALCION) 0.25 MG tablet Take 0.25 mg by mouth at bedtime as needed for sleep. For sleep    Yes Historical Provider, MD  amoxicillin-clavulanate (AUGMENTIN) 875-125 MG tablet Take 1 tablet by mouth 2 (two) times daily. 07/29/15   Chesley Mires, MD   Physical Exam: Filed Vitals:   10/01/15 0914 10/01/15 1000  BP: 131/86 123/49  Pulse: 25 101  Temp:    Resp: 15 16     General: Well developed, well nourished, NAD, appears stated age  HEENT: NCAT, PERRLA, EOMI, Anicteic Sclera, mucous membranes dry   Neck: Supple, no JVD, no masses  Cardiovascular: S1 S2 auscultated, no rubs, murmurs or gallops. Tachycardic   Respiratory: Upper airway congestion, no wheezing or rhonchi on exam.  Diminished breath sounds  Abdomen: Soft, nontender, nondistended, + bowel sounds  Extremities: warm dry without cyanosis clubbing or edema  Neuro: AAOx3, cranial nerves grossly intact. Strength 5/5 in patient's upper and lower extremities bilaterally  Skin: Without rashes exudates or nodules  Psych: Normal affect and demeanor with intact judgement and insight  Labs on Admission:  Basic Metabolic Panel:  Recent Labs Lab 10/01/15 0925  NA 129*  K 3.1*  CL 92*  CO2 26  GLUCOSE 175*  BUN 25*  CREATININE 0.84  CALCIUM 8.4*   Liver Function Tests:  Recent Labs Lab 10/01/15 0925  AST 18  ALT 17  ALKPHOS 57  BILITOT 0.6  PROT 6.9  ALBUMIN 3.8   No results for input(s): LIPASE, AMYLASE in the last 168 hours. No results for input(s): AMMONIA in the last 168 hours. CBC:  Recent Labs Lab 10/01/15 0837  WBC 18.1*  NEUTROABS 14.6*  HGB 13.4  HCT 39.7  MCV 83.8  PLT 242   Cardiac Enzymes: No results for input(s): CKTOTAL, CKMB, CKMBINDEX, TROPONINI in the last 168 hours.  BNP (last 3 results) No results for input(s): BNP in the last 8760 hours.  ProBNP (last 3 results)  Recent Labs  01/18/15 1542 01/29/15 1553 02/02/15 1156  PROBNP 75.0 40.0 150.0*    CBG: No results for input(s): GLUCAP in the last 168 hours.  Radiological Exams on Admission: Dg Chest 2  View  10/01/2015  CLINICAL DATA:  Shortness of breath, productive cough with grey mucus, weakness, dizziness, and fever to 102.2 degrees for 3 days worse this morning, COPD, diabetes mellitus, hypertension, coronary artery disease post MI, former smoker EXAM: CHEST  2 VIEW COMPARISON:  06/29/2015 FINDINGS: Upper normal heart size. Curvilinear density on lateral view either representing coronary arterial calcification  or coronary stent. Atherosclerotic calcification aorta. Mediastinal contours and pulmonary vascularity normal. Emphysematous and bronchitic changes consistent with COPD. Hazy RIGHT upper lobe opacity question pneumonia. Remaining lungs clear. No pleural effusion or pneumothorax. Surgical clips in the LEFT cervical region question thyroid surgery. Bones demineralized. IMPRESSION: COPD changes with hazy RIGHT upper lobe opacity question pneumonia. Electronically Signed   By: Lavonia Dana M.D.   On: 10/01/2015 09:15    EKG: Independently reviewed. Sinus tachycardia, rate 106, no change from prior EKGs  Assessment/Plan  Sepsis secondary to community acquired pneumonia -Upon admission, patient was febrile, tachycardic, with leukocytosis -Chest x-ray: COPD changes with right upper lobe opacity question pneumonia -Sepsis and pneumonia order sets utilized -Continue IV fluid, Mucinex -Placed on azithromycin and ceftriaxone -Pending influenza PCR, blood and sputum cultures, strep pneumonia and legionella urine antigens -Monitor CBC  COPD -Patient currently has no wheezing on exam -Continue prednisone, Singulair -Patient follows with Dr. Halford Chessman  Hypokalemia -Will replace and continue monitor BMP  Chronic hyponatremia -Will check TSH -Patient has had low sodium in the past -Will place on IVF, monitor BMP  Diabetes mellitus, type II -Patient has allergies to Lantus and NovoLog -Currently on no home medications -Will continue to monitor closely  Hypothyroidism -Continue  Synthroid  Essential hypertension -Will hold Maxide as patient appears to be dry  GERD/Zenker's diverticulum -Continue PPI  Seasonal allergies -Continue Flonase and Zyrtec  Legal blindness -Stable  DVT prophylaxis: Lovenox  Code Status: Full  Condition: Guarded  Family Communication: None at bedside. Admission, patients condition and plan of care including tests being ordered have been discussed with the patient, who indicates understanding and agrees with the plan and Code Status.  Disposition Plan: Admitted.    Time spent: 70 minutes  Damarious Holtsclaw D.O. [facility] Pager [phone]  If 7PM-7AM, please contact night-coverage [email or web address] Password [facility] 10/01/2015, 10:47 AM

## 2015-10-01 NOTE — Progress Notes (Signed)
Pharmacy Antibiotic Note  [name] is a 82 y.o. female admitted on 10/01/2015 with CAP.  Pharmacy has been consulted for Rocephin/Zithromax dosing.  Plan:  Azithromycin 500 mg IV q24 hr  Ceftriaxone 1 g IV q24 hr  Usual duration of therapy is 7 days  Pharmacy will sign off, as neither antibiotic requires renal adjustment  Height: 5' 2.5" (158.8 cm) Weight: 135 lb (61.236 kg) IBW/kg (Calculated) : 51.25  Temp (24hrs), Avg:100.2 F (37.9 C), Min:98.2 F (36.8 C), Max:102.2 F (39 C)  No results for input(s): WBC, CREATININE, LATICACIDVEN, VANCOTROUGH, VANCOPEAK, VANCORANDOM, GENTTROUGH, GENTPEAK, GENTRANDOM, TOBRATROUGH, TOBRAPEAK, TOBRARND, AMIKACINPEAK, AMIKACINTROU, AMIKACIN in the last 168 hours.  CrCl cannot be calculated (Patient has no serum creatinine result on file.).    Allergies  Allergen Reactions  . Lantus [Insulin Glargine]     Patient states "sulfate binder causes sinus infection/pneumonia".  Mack Hook [Levofloxacin In D5w] Other (See Comments)    Muscle aches  . Maxidex [Dexamethasone] Other (See Comments)    Insomnia, anxiety, dizziness  . Metoprolol-Hydrochlorothiazide Other (See Comments)     decreased bp  . Norvasc [Amlodipine] Other (See Comments)    BIL Ankle edema  . Novolog [Insulin Aspart]     Patient states "sulfate binder causes sinus infection/pneumonia". Tolerates Humalog.  . Sulfonamide Derivatives Hives  . Titanium Other (See Comments)    Neck wouldn't heal with titanium cervical plate  . Adhesive [Tape] Hives and Rash    Antimicrobials this admission: Rocephin 4/7 >>  Zithromax 4/7 >>   Dose adjustments this admission: ---  Microbiology results: 4/7 BCx: sent 4/7 UCx: sent  4/7 Flu panel: IP  Thank you for allowing pharmacy to be a part of this patient's care.  Reuel Boom, PharmD, BCPS Pager: [phone] 10/01/2015, 8:34 AM

## 2015-10-01 NOTE — ED Notes (Signed)
Pt. Is unable to urinate at this time. Is aware we need a sample. 

## 2015-10-01 NOTE — ED Notes (Signed)
Per EMS report: pt coming from home with a c/o dizziness, nausea, and a productive cough since yesterday.  Pt reports low grade fever and EMS noted that pt feels warm to the touch. No V/D.  Pt's daughter is at home sick with the flu and doesn't live with pt but does bring pt's grandchildren to pt's house. Pt given 4mg  zofran.

## 2015-10-01 NOTE — ED Notes (Signed)
MD aware of pt's VS

## 2015-10-01 NOTE — ED Notes (Signed)
Attempted twice for second set of culture, unsuccessful

## 2015-10-01 NOTE — ED Notes (Signed)
Patient transported to X-ray 

## 2015-10-01 NOTE — ED Notes (Signed)
Bed: [id number] Expected date:  Expected time:  Means of arrival:  Comments: EMS - 82s dizzy/nausea

## 2015-10-01 NOTE — ED Notes (Signed)
Pt aware of need for urine sample. Unable to void at this time. 

## 2015-10-01 NOTE — ED Provider Notes (Signed)
CSN: [id number]     Arrival date & time 10/01/15  0745 History   First MD Initiated Contact with Patient 10/01/15 [id number]     No chief complaint on file.    (Consider location/radiation/quality/duration/timing/severity/associated sxs/prior Treatment) HPI Complains of generalized weakness and cough productive of yellow sputum onset 2-3 days ago. Other symptoms include nausea and shortness of breath. She reports that she had to crawl on the floor to reach the door after calling 911 this morning as she was too weak to walk. Nothing makes symptoms better or worse. No other associated symptoms she is presently not nauseated or denies vomiting denies other complaint Past Medical History  Diagnosis Date  . Coronary artery disease   . 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 Hypertension   . Dyslipidemia   . Diabetes mellitus   . CVA (cerebral infarction) 1991  . Macular degeneration   . GERD (gastroesophageal reflux disease)   . Diverticulosis   . IBS (irritable bowel syndrome)   . Zenker's diverticulum   . Emphysema   . Pulmonary hypertension ([place])   . Chronic sinusitis   . Fibromyalgia   . Hypothyroidism   . Sjogren's disease ([place])   . Raynaud's phenomenon   . Cervical disc disease   . Adenomatous colon polyp   . Scleroderma ([place])   . COPD (chronic obstructive pulmonary disease) ([place])   . Arthritis   . Blood transfusion   . Myocardial infarction [facility]) may 27th 2007  . Depression   . Stroke [facility]) 1990    brain stem stroke - weakness rt hand  . Cancer [facility])     cervical cancer pre hysterectomy  . Sleep apnea     STOP BANG SCORE 5  . Legally blind   . CONSTIPATION, CHRONIC 06/26/2007    Qualifier: Diagnosis of  By: Nils Pyle CMA ([place]), Mearl Latin    . Pneumonia 07/16/2013     HX PNEUMONIA  . Pneumonia   . Bronchitis    Past Surgical History  Procedure Laterality Date  . Total abdominal hysterectomy  1973  . Bilateral oophorectomy  2002  . Spinal fusion  1997    and implantation of a plate    . Lobe thyroid removal    . Thyroid lobectomy  1991    removal of left lobe thyroid  . Cholecystectomy  1965  . Appendectomy    . Cataract extraction    . Bronchoscopy  02-22-09  . Stented cardiac  artery  2007 / 2007 / 2010    X 3 STENT  . Proctoscopy  03/18/2012    Procedure: PROCTOSCOPY;  Surgeon: Adin Hector, MD;  Location: [facility];  Service: General;  Laterality: N/A;  rigid procto  . Colon surgery  2014    colon resection  . Esophagogastroduodenoscopy (egd) with esophageal dilation N/A 06/18/2013    Procedure: ESOPHAGOGASTRODUODENOSCOPY (EGD) WITH ESOPHAGEAL DILATION;  Surgeon: Inda Castle, MD;  Location: [place];  Service: Endoscopy;  Laterality: N/A;  . Cardiac catheterization      x 3  . Colonectomy    . Radiology with anesthesia N/A 03/09/2015    Procedure: MRI CERVICAL SPINE WITHOUT AND LUMBER WITHOUT;  Surgeon: Medication Radiologist, MD;  Location: [place];  Service: Radiology;  Laterality: N/A;  . Flexible sigmoidoscopy N/A 07/15/2015    Procedure: FLEXIBLE SIGMOIDOSCOPY;  Surgeon: Milus Banister, MD;  Location: [facility];  Service: Endoscopy;  Laterality: N/A;  . Balloon dilation N/A 07/15/2015    Procedure: BALLOON DILATION;  Surgeon:  Milus Banister, MD;  Location: [facility];  Service: Endoscopy;  Laterality: N/A;   Family History  Problem Relation Age of Onset  . Other Mother 22    Died from sepsis  . Stroke Father 13    died  . Heart disease Father   . Diabetes Father   . Cancer Sister     lung  . Diabetes Sister   . Cancer Brother     lung  . Diabetes Brother   . Diabetes Brother    Social History  Substance Use Topics  . Smoking status: Former Smoker -- 1.00 packs/day for 40 years    Types: Cigarettes    Quit date: 06/26/1988  . Smokeless tobacco: Never Used     Comment: 40 pack years  . Alcohol Use: No   OB History    No data available     Review of Systems  HENT: Negative.   Respiratory: Positive for cough and shortness of  breath.   Cardiovascular: Negative.   Gastrointestinal: Negative.   Musculoskeletal: Negative.   Skin: Negative.   Allergic/Immunologic: Positive for immunocompromised state.       Diabetic  Neurological: Positive for weakness.  Psychiatric/Behavioral: Negative.   All other systems reviewed and are negative.     Allergies  Lantus; Levaquin; Maxidex; Metoprolol-hydrochlorothiazide; Norvasc; Novolog; Sulfonamide derivatives; Titanium; and Adhesive  Home Medications   Prior to Admission medications   Medication Sig Start Date End Date Taking? Authorizing Provider  amoxicillin-clavulanate (AUGMENTIN) 875-125 MG tablet Take 1 tablet by mouth 2 (two) times daily. 07/29/15   Chesley Mires, MD  aspirin EC 81 MG tablet Take 1 tablet (81 mg total) by mouth daily. 11/18/13   Fay Records, MD  cetirizine (ZYRTEC) 10 MG tablet Take 10 mg by mouth daily after breakfast.     Historical Provider, MD  esomeprazole (NEXIUM) 20 MG capsule Take 40 mg by mouth daily at 12 noon.    Historical Provider, MD  fluticasone (FLONASE) 50 MCG/ACT nasal spray Place 1 spray into the nose daily as needed for allergies.  06/04/12   Historical Provider, MD  guaiFENesin (MUCINEX) 600 MG 12 hr tablet Take 600 mg by mouth 2 (two) times daily as needed for to loosen phlegm.    Historical Provider, MD  HYDROcodone-acetaminophen (NORCO) 7.5-325 MG tablet Take 0.5 tablets by mouth every 6 (six) hours as needed for moderate pain.    Historical Provider, MD  imipramine (TOFRANIL) 25 MG tablet Take 75 mg by mouth at bedtime.  05/26/09   Historical Provider, MD  insulin lispro (HUMALOG KWIKPEN) 100 UNIT/ML KiwkPen Inject 4-6 Units into the skin 3 (three) times daily with meals. Inject 6 units subcutaneously with breakfast and lunch, inject 4 units with supper    Historical Provider, MD  levothyroxine (SYNTHROID, LEVOTHROID) 100 MCG tablet Take 100 mcg by mouth daily before breakfast.    Historical Provider, MD  montelukast (SINGULAIR)  10 MG tablet Take 10 mg by mouth daily with breakfast. 05/10/15   Historical Provider, MD  predniSONE (DELTASONE) 5 MG tablet Take 1 tablet (5 mg total) by mouth daily with breakfast. 05/05/15   Chesley Mires, MD  saccharomyces boulardii (FLORASTOR) 250 MG capsule Take 1 capsule (250 mg total) by mouth 2 (two) times daily. 05/03/15   Oswald Hillock, MD  triamterene-hydrochlorothiazide (MAXZIDE-25) 37.5-25 MG tablet Take 1 tablet by mouth daily.    Historical Provider, MD  triazolam (HALCION) 0.25 MG tablet Take 0.125 mg by mouth at bedtime as  needed for sleep. For sleep    Historical Provider, MD   There were no vitals taken for this visit. Physical Exam  Constitutional: She is oriented to person, place, and time.  Ill-appearing alert Glasgow Coma Score 15  HENT:  Head: Normocephalic and atraumatic.  Mucous membranes dry  Eyes: Conjunctivae are normal. Pupils are equal, round, and reactive to light.  Neck: Neck supple. No tracheal deviation present. No thyromegaly present.  Cardiovascular: Regular rhythm.   No murmur heard. Mildly tachycardic  Pulmonary/Chest: Effort normal and breath sounds normal.  Abdominal: Soft. Bowel sounds are normal. She exhibits no distension. There is no tenderness.  Musculoskeletal: Normal range of motion. She exhibits no edema or tenderness.  Neurological: She is alert and oriented to person, place, and time. No cranial nerve deficit. Coordination normal.  Skin: Skin is warm and dry. No rash noted.  Psychiatric: She has a normal mood and affect.  Nursing note and vitals reviewed.   ED Course  Procedures (including critical care time) Labs Review Labs Reviewed - No data to display  Imaging Review No results found. I have personally reviewed and evaluated these images and lab results as part of my medical decision-making.   EKG Interpretation   Date/Time:  Friday October 01 2015 08:34:16 EDT Ventricular Rate:  106 PR Interval:  149 QRS Duration: 74 QT  Interval:  289 QTC Calculation: 384 R Axis:   54 Text Interpretation:  Sinus tachycardia Abnormal T, consider ischemia,  diffuse leads No significant change since last tracing Confirmed by  Maxey Ransom  MD, Arseniy Toomey [phone]) on 10/01/2015 8:55:08 AM     10:15 AM patient states "I feel about the same" after treatment with intravenous fluids, intravenous antibiotics and Tylenol. She is alert, ill-appearing but answers appropriately. Results for orders placed or performed during the hospital encounter of 10/01/15  CBC WITH DIFFERENTIAL  Result Value Ref Range   WBC 18.1 (H) 4.0 - 10.5 K/uL   RBC 4.74 3.87 - 5.11 MIL/uL   Hemoglobin 13.4 12.0 - 15.0 g/dL   HCT 39.7 36.0 - 46.0 %   MCV 83.8 78.0 - 100.0 fL   MCH 28.3 26.0 - 34.0 pg   MCHC 33.8 30.0 - 36.0 g/dL   RDW 13.3 11.5 - 15.5 %   Platelets 242 150 - 400 K/uL   Neutrophils Relative % 81 %   Neutro Abs 14.6 (H) 1.7 - 7.7 K/uL   Lymphocytes Relative 11 %   Lymphs Abs 2.0 0.7 - 4.0 K/uL   Monocytes Relative 8 %   Monocytes Absolute 1.4 (H) 0.1 - 1.0 K/uL   Eosinophils Relative 0 %   Eosinophils Absolute 0.1 0.0 - 0.7 K/uL   Basophils Relative 0 %   Basophils Absolute 0.0 0.0 - 0.1 K/uL  Comprehensive metabolic panel  Result Value Ref Range   Sodium 129 (L) 135 - 145 mmol/L   Potassium 3.1 (L) 3.5 - 5.1 mmol/L   Chloride 92 (L) 101 - 111 mmol/L   CO2 26 22 - 32 mmol/L   Glucose, Bld 175 (H) 65 - 99 mg/dL   BUN 25 (H) 6 - 20 mg/dL   Creatinine, Ser 0.84 0.44 - 1.00 mg/dL   Calcium 8.4 (L) 8.9 - 10.3 mg/dL   Total Protein 6.9 6.5 - 8.1 g/dL   Albumin 3.8 3.5 - 5.0 g/dL   AST 18 15 - 41 U/L   ALT 17 14 - 54 U/L   Alkaline Phosphatase 57 38 - 126 U/L  Total Bilirubin 0.6 0.3 - 1.2 mg/dL   GFR calc non Af Amer >60 >60 mL/min   GFR calc Af Amer >60 >60 mL/min   Anion gap 11 5 - 15  I-Stat CG4 Lactic Acid, ED  (not at  [facility])  Result Value Ref Range   Lactic Acid, Venous 1.90 0.5 - 2.0 mmol/L  I-Stat Troponin, ED (not at [facility])   Result Value Ref Range   Troponin i, poc 0.03 0.00 - 0.08 ng/mL   Comment 3           Dg Chest 2 View  10/01/2015  CLINICAL DATA:  Shortness of breath, productive cough with grey mucus, weakness, dizziness, and fever to 102.2 degrees for 3 days worse this morning, COPD, diabetes mellitus, hypertension, coronary artery disease post MI, former smoker EXAM: CHEST  2 VIEW COMPARISON:  06/29/2015 FINDINGS: Upper normal heart size. Curvilinear density on lateral view either representing coronary arterial calcification or coronary stent. Atherosclerotic calcification aorta. Mediastinal contours and pulmonary vascularity normal. Emphysematous and bronchitic changes consistent with COPD. Hazy RIGHT upper lobe opacity question pneumonia. Remaining lungs clear. No pleural effusion or pneumothorax. Surgical clips in the LEFT cervical region question thyroid surgery. Bones demineralized. IMPRESSION: COPD changes with hazy RIGHT upper lobe opacity question pneumonia. Electronically Signed   By: Lavonia Dana M.D.   On: 10/01/2015 09:15   Chest xray viewed by me MDM  Code sepsis called based on surface criteria tachypnea, tachycardia, fever, likely source of infection respiratory Final diagnoses:  None  Dr Ree Kida consulted and will come to ED to evaluate patient [facility] admission to medical surgical floor intravenous antibiotics and intravenous fluids. I've ordered oral potassium supplementation Diagnoses #1 community-acquired pneumonia #2 sepsis #3dehydration #4 hyponatremia #5 hypokalemia CRITICAL CARE Performed by: Orlie Dakin Total critical care time: 30 minutes Critical care time was exclusive of separately billable procedures and treating other patients. Critical care was necessary to treat or prevent imminent or life-threatening deterioration. Critical care was time spent personally by me on the following activities: development of treatment plan with patient and/or surrogate as well as  nursing, discussions with consultants, evaluation of patient's response to treatment, examination of patient, obtaining history from patient or surrogate, ordering and performing treatments and interventions, ordering and review of laboratory studies, ordering and review of radiographic studies, pulse oximetry and re-evaluation of patient's condition.    Orlie Dakin, MD 10/01/15 1027

## 2015-10-02 LAB — PROCALCITONIN: Procalcitonin: 0.28 ng/mL

## 2015-10-02 LAB — GLUCOSE, CAPILLARY
Glucose-Capillary: 190 mg/dL — ABNORMAL HIGH (ref 65–99)
Glucose-Capillary: 316 mg/dL — ABNORMAL HIGH (ref 65–99)
Glucose-Capillary: 163 mg/dL — ABNORMAL HIGH (ref 65–99)
Glucose-Capillary: 148 mg/dL — ABNORMAL HIGH (ref 65–99)

## 2015-10-02 LAB — PROTIME-INR
INR: 1.18 (ref 0.00–1.49)
Prothrombin Time: 14.8 seconds (ref 11.6–15.2)

## 2015-10-02 LAB — CREATININE, SERUM
Creatinine, Ser: 0.64 mg/dL (ref 0.44–1.00)
GFR calc Af Amer: >60 mL/min (ref >60)
GFR calc non Af Amer: >60 mL/min (ref >60)

## 2015-10-02 LAB — BASIC METABOLIC PANEL
Anion gap: 9 (ref 5–15)
BUN: 13 mg/dL (ref 6–20)
CO2: 26 mmol/L (ref 22–32)
Calcium: 8.5 mg/dL — ABNORMAL LOW (ref 8.9–10.3)
Chloride: 96 mmol/L — ABNORMAL LOW (ref 101–111)
Creatinine, Ser: 0.64 mg/dL (ref 0.44–1.00)
GFR calc Af Amer: >60 mL/min (ref >60)
GFR calc non Af Amer: >60 mL/min (ref >60)
Glucose, Bld: 160 mg/dL — ABNORMAL HIGH (ref 65–99)
Potassium: 4 mmol/L (ref 3.5–5.1)
Sodium: 131 mmol/L — ABNORMAL LOW (ref 135–145)

## 2015-10-02 LAB — TSH: TSH: 3.109 u[IU]/mL (ref 0.350–4.500)

## 2015-10-02 LAB — APTT: aPTT: 33 seconds (ref 24–37)

## 2015-10-02 LAB — STREP PNEUMONIAE URINARY ANTIGEN: Strep Pneumo Urinary Antigen: NEGATIVE

## 2015-10-02 LAB — HIV ANTIBODY (ROUTINE TESTING W REFLEX): HIV Screen 4th Generation wRfx: NONREACTIVE

## 2015-10-02 MED ORDER — INSULIN LISPRO 100 UNIT/ML (KWIKPEN)
4.0000 [IU] | PEN_INJECTOR | Freq: Three times a day (TID) | SUBCUTANEOUS | Status: DC
  Administered 2015-10-02 – 2015-10-06 (×12): 4 [IU] via SUBCUTANEOUS

## 2015-10-02 MED ORDER — VANCOMYCIN HCL IN DEXTROSE 1-5 GM/200ML-% IV SOLN
1000.0000 mg | INTRAVENOUS | Status: AC
  Administered 2015-10-02: 1000 mg via INTRAVENOUS
  Filled 2015-10-02: qty 200

## 2015-10-02 MED ORDER — PREDNISONE 5 MG PO TABS
5.0000 mg | ORAL_TABLET | Freq: Every day | ORAL | Status: DC
  Administered 2015-10-03 – 2015-10-06 (×4): 5 mg via ORAL
  Filled 2015-10-02 (×4): qty 1

## 2015-10-02 MED ORDER — VANCOMYCIN HCL 500 MG IV SOLR
500.0000 mg | Freq: Two times a day (BID) | INTRAVENOUS | Status: DC
  Administered 2015-10-02 – 2015-10-04 (×4): 500 mg via INTRAVENOUS
  Filled 2015-10-02 (×5): qty 500

## 2015-10-02 NOTE — Progress Notes (Signed)
Pharmacy Antibiotic Note  [name] is a 82 y.o. female admitted on 10/01/2015 with bacteremia.  Pharmacy has been consulted for Vancomycin dosing.  Patient admitted on 4/7 with CAP and placed on Azithromycin/Ceftriaxone. Blood cultures showing Gram + Cocci, therefore vancomycin has now been added.  Plan: Vancomycin 1gm IV x 1 then 500mg  IV every 12 hours.  Goal trough 15-20 mcg/mL.  Height: 5\' 2"  (157.5 cm) Weight: 135 lb (61.236 kg) IBW/kg (Calculated) : 50.1  Temp (24hrs), Avg:99.7 F (37.6 C), Min:98.2 F (36.8 C), Max:102.2 F (39 C)   Recent Labs Lab 10/01/15 0837 10/01/15 0852 10/01/15 0925  WBC 18.1*  --   --   CREATININE  --   --  0.84  LATICACIDVEN  --  1.90  --     Estimated Creatinine Clearance: 44.4 mL/min (by C-G formula based on Cr of 0.84).    Allergies  Allergen Reactions  . Lantus [Insulin Glargine]     Patient states "sulfate binder causes sinus infection/pneumonia".  Mack Hook [Levofloxacin In D5w] Other (See Comments)    Muscle aches  . Maxidex [Dexamethasone] Other (See Comments)    Insomnia, anxiety, dizziness  . Metoprolol-Hydrochlorothiazide Other (See Comments)     decreased bp  . Norvasc [Amlodipine] Other (See Comments)    BIL Ankle edema  . Novolog [Insulin Aspart]     Patient states "sulfate binder causes sinus infection/pneumonia". Tolerates Humalog.  . Sulfonamide Derivatives Hives  . Titanium Other (See Comments)    Neck wouldn't heal with titanium cervical plate  . Adhesive [Tape] Hives and Rash    Antimicrobials this admission: 4/7 Ceftriaxone >>   4/7 Azithromycin >>   4/8 Vanc >>  Dose adjustments this admission:    Microbiology results: 4/7 BCx:  GPC 4/7 UCx:    4/7 Sputum:     Thank you for allowing pharmacy to be a part of this patient's care.  Everette Rank, PharmD 10/02/2015 12:49 AM

## 2015-10-02 NOTE — Progress Notes (Signed)
PROGRESS NOTE  [name] [id number] DOB: [birth year] DOA: 10/01/2015 PCP: Merrilee Seashore, MD Outpatient Specialists:    LOS: 1 day   Brief Narrative: 82 y.o. female with a history of hypertension, diabetes, hypothyroidism, COPD, presented to the emergency department with complaints of cough and feeling weak for the last 2-3 days  Assessment & Plan: Active Problems:   Essential hypertension   ZENKER'S DIVERTICULUM   Diabetes mellitus ([place])   CAP (community acquired pneumonia)   Hyponatremia   Sepsis ([place])   Hypothyroidism   Sepsis secondary to community acquired pneumonia - Upon admission, patient was febrile, tachycardic, with leukocytosis - Sepsis physiology improving - Chest x-ray on admission: COPD changes with right upper lobe opacity question pneumonia - Placed on azithromycin and ceftriaxone, continue - Influenza PCR negative, respiratory viral panel pending - 1/2 blood cultures with GPC in chains, vancomycin added overnight, continue until speciation  COPD - Patient currently has no wheezing on exam - Continue prednisone, Singulair - Patient follows with Dr. Halford Chessman  Hypokalemia - Will replace and continue monitor BMP  Chronic hyponatremia - Will check TSH - Patient has had low sodium in the past - Will place on IVF, monitor BMP, repeat today pending  Diabetes mellitus, type II - Patient has allergies to Lantus and NovoLog - Resume her home medications, and pharmacy does not stop it so she will need to bring it from now  - states her allergy to insulin is "sinus congestion"  Hypothyroidism - Continue Synthroid  Essential hypertension - Will hold Maxide as patient appears to be dry  GERD/Zenker's diverticulum - Continue PPI  Seasonal allergies - Continue Flonase and Zyrtec  Legal blindness   DVT prophylaxis: Lovenox Code Status: Full code Family Communication: No family bedside Disposition Plan: Home when ready Barriers for discharge:  Sepsis, IV antibiotics  Consultants:   None  Procedures:   None   Antimicrobials:  Ceftriaxone 4/7 >>  Azithromycin 4/7 >>  Vancomycin 4/8 >>   Subjective: - Feeling a little bit better this morning, feels like her breathing is improved. She denies any chest pain or shortness of breath.  Objective: Filed Vitals:   10/01/15 1211 10/01/15 2048 10/01/15 2055 10/02/15 0529  BP: 100/68 115/36 118/30 141/44  Pulse: 88 76  88  Temp: 99.5 F (37.5 C) 99.4 F (37.4 C)  97.2 F (36.2 C)  TempSrc: Oral Oral  Oral  Resp: 20 18  17   Height: 5\' 2"  (1.575 m)     Weight: 61.236 kg (135 lb)     SpO2: 100% 99%  96%    Intake/Output Summary (Last 24 hours) at 10/02/15 1217 Last data filed at 10/02/15 0328  Gross per 24 hour  Intake 1043.6 ml  Output    700 ml  Net  343.6 ml   Filed Weights   10/01/15 0753 10/01/15 1211  Weight: 61.236 kg (135 lb) 61.236 kg (135 lb)    Examination: Constitutional: NAD, Comfortable, eating breakfast Filed Vitals:   10/01/15 1211 10/01/15 2048 10/01/15 2055 10/02/15 0529  BP: 100/68 115/36 118/30 141/44  Pulse: 88 76  88  Temp: 99.5 F (37.5 C) 99.4 F (37.4 C)  97.2 F (36.2 C)  TempSrc: Oral Oral  Oral  Resp: 20 18  17   Height: 5\' 2"  (1.575 m)     Weight: 61.236 kg (135 lb)     SpO2: 100% 99%  96%   Eyes: PERRL, lids and conjunctivae normal ENMT: Mucous membranes are moist. Posterior pharynx clear of any  exudate or lesions, Mildly erythematous Respiratory: clear to auscultation bilaterally, no wheezing, no crackles. Normal respiratory effort. No accessory muscle use.  Cardiovascular: Regular rate and rhythm, no murmurs / rubs / gallops. No extremity edema. 2+ pedal pulses.  Abdomen: no tenderness, no masses palpated. Bowel sounds positive.  Musculoskeletal: no clubbing / cyanosis. No joint deformity upper and lower extremities. Normal muscle tone.  Skin: no rashes, lesions, ulcers. No induration Neurologic: Nonfocal Psychiatric:  Normal judgment and insight. Alert and oriented x 3. Normal mood.    Data Reviewed: I have personally reviewed following labs and imaging studies  CBC:  Recent Labs Lab 10/01/15 0837  WBC 18.1*  NEUTROABS 14.6*  HGB 13.4  HCT 39.7  MCV 83.8  PLT [id number]   Basic Metabolic Panel:  Recent Labs Lab 10/01/15 0925 10/02/15 0413  NA 129*  --   K 3.1*  --   CL 92*  --   CO2 26  --   GLUCOSE 175*  --   BUN 25*  --   CREATININE 0.84 0.64  CALCIUM 8.4*  --    GFR: Estimated Creatinine Clearance: 46.6 mL/min (by C-G formula based on Cr of 0.64). Liver Function Tests:  Recent Labs Lab 10/01/15 0925  AST 18  ALT 17  ALKPHOS 57  BILITOT 0.6  PROT 6.9  ALBUMIN 3.8   No results for input(s): LIPASE, AMYLASE in the last 168 hours. No results for input(s): AMMONIA in the last 168 hours. Coagulation Profile:  Recent Labs Lab 10/02/15 0413  INR 1.18   Cardiac Enzymes: No results for input(s): CKTOTAL, CKMB, CKMBINDEX, TROPONINI in the last 168 hours. BNP (last 3 results)  Recent Labs  01/18/15 1542 01/29/15 1553 02/02/15 1156  PROBNP 75.0 40.0 150.0*   HbA1C: No results for input(s): HGBA1C in the last 72 hours. CBG:  Recent Labs Lab 10/01/15 2135 10/02/15 0759 10/02/15 1137  GLUCAP 221* 190* 316*   Lipid Profile: No results for input(s): CHOL, HDL, LDLCALC, TRIG, CHOLHDL, LDLDIRECT in the last 72 hours. Thyroid Function Tests:  Recent Labs  10/02/15 0413  TSH 3.109   Anemia Panel: No results for input(s): VITAMINB12, FOLATE, FERRITIN, TIBC, IRON, RETICCTPCT in the last 72 hours. Urine analysis:    Component Value Date/Time   COLORURINE YELLOW 10/01/2015 1400   APPEARANCEUR CLEAR 10/01/2015 1400   LABSPEC 1.012 10/01/2015 1400   PHURINE 6.0 10/01/2015 1400   GLUCOSEU NEGATIVE 10/01/2015 1400   GLUCOSEU NEGATIVE 03/24/2010 1137   HGBUR NEGATIVE 10/01/2015 1400   BILIRUBINUR NEGATIVE 10/01/2015 1400   KETONESUR NEGATIVE 10/01/2015 1400    PROTEINUR NEGATIVE 10/01/2015 1400   UROBILINOGEN 0.2 05/01/2015 2019   NITRITE NEGATIVE 10/01/2015 1400   LEUKOCYTESUR TRACE* 10/01/2015 1400   Sepsis Labs: Invalid input(s): PROCALCITONIN, LACTICIDVEN  Recent Results (from the past 240 hour(s))  Blood Culture (routine x 2)     Status: None (Preliminary result)   Collection Time: 10/01/15  8:33 AM  Result Value Ref Range Status   Specimen Description BLOOD LEFT ANTECUBITAL  Final   Special Requests BOTTLES DRAWN AEROBIC AND ANAEROBIC 5ML  Final   Culture  Setup Time   Final    GRAM POSITIVE COCCI IN CHAINS IN BOTH AEROBIC AND ANAEROBIC BOTTLES CRITICAL RESULT CALLED TO, READ BACK BY AND VERIFIED WITH: CLARK,RN @2343  10/01/15 MKELLY    Culture   Final    TOO YOUNG TO READ Performed at [facility]    Report Status PENDING  Incomplete  Radiology Studies: Dg Chest 2 View  10/01/2015  CLINICAL DATA:  Shortness of breath, productive cough with grey mucus, weakness, dizziness, and fever to 102.2 degrees for 3 days worse this morning, COPD, diabetes mellitus, hypertension, coronary artery disease post MI, former smoker EXAM: CHEST  2 VIEW COMPARISON:  06/29/2015 FINDINGS: Upper normal heart size. Curvilinear density on lateral view either representing coronary arterial calcification or coronary stent. Atherosclerotic calcification aorta. Mediastinal contours and pulmonary vascularity normal. Emphysematous and bronchitic changes consistent with COPD. Hazy RIGHT upper lobe opacity question pneumonia. Remaining lungs clear. No pleural effusion or pneumothorax. Surgical clips in the LEFT cervical region question thyroid surgery. Bones demineralized. IMPRESSION: COPD changes with hazy RIGHT upper lobe opacity question pneumonia. Electronically Signed   By: Lavonia Dana M.D.   On: 10/01/2015 09:15     Scheduled Meds: . aspirin EC  81 mg Oral Daily  . azithromycin  500 mg Intravenous Q24H  . cefTRIAXone (ROCEPHIN)  IV  1 g Intravenous  Q24H  . enoxaparin (LOVENOX) injection  40 mg Subcutaneous Q24H  . imipramine  75 mg Oral QHS  . insulin lispro  4 Units Subcutaneous TID WC  . levothyroxine  100 mcg Oral QAC breakfast  . loratadine  10 mg Oral Daily  . montelukast  10 mg Oral Q breakfast  . pantoprazole  40 mg Oral Daily  . saccharomyces boulardii  250 mg Oral Daily  . vancomycin  500 mg Intravenous Q12H   Continuous Infusions: . sodium chloride 75 mL/hr at 10/02/15 0239     Marzetta Board, MD, PhD [facility] Pager [phone]  If 7PM-7AM, please contact night-coverage [email or web address] Password [facility] 10/02/2015, 12:17 PM

## 2015-10-02 NOTE — Progress Notes (Signed)
Received call from [facility] lab with a positive blood culture. Result was gram positive cocci in chains from anaerobic bottle. Information was relayed to NP Rogue Bussing and Vanc. Per pharmacy protocol was ordered.

## 2015-10-03 LAB — GLUCOSE, CAPILLARY
Glucose-Capillary: 138 mg/dL — ABNORMAL HIGH (ref 65–99)
Glucose-Capillary: 248 mg/dL — ABNORMAL HIGH (ref 65–99)
Glucose-Capillary: 182 mg/dL — ABNORMAL HIGH (ref 65–99)
Glucose-Capillary: 175 mg/dL — ABNORMAL HIGH (ref 65–99)

## 2015-10-03 LAB — BASIC METABOLIC PANEL
Anion gap: 8 (ref 5–15)
BUN: 10 mg/dL (ref 6–20)
CO2: 24 mmol/L (ref 22–32)
Calcium: 8.2 mg/dL — ABNORMAL LOW (ref 8.9–10.3)
Chloride: 101 mmol/L (ref 101–111)
Creatinine, Ser: 0.58 mg/dL (ref 0.44–1.00)
GFR calc Af Amer: >60 mL/min (ref >60)
GFR calc non Af Amer: >60 mL/min (ref >60)
Glucose, Bld: 160 mg/dL — ABNORMAL HIGH (ref 65–99)
Potassium: 3.8 mmol/L (ref 3.5–5.1)
Sodium: 133 mmol/L — ABNORMAL LOW (ref 135–145)

## 2015-10-03 LAB — CBC
HCT: 30.2 % — ABNORMAL LOW (ref 36.0–46.0)
Hemoglobin: 10.3 g/dL — ABNORMAL LOW (ref 12.0–15.0)
MCH: 29.3 pg (ref 26.0–34.0)
MCHC: 34.1 g/dL (ref 30.0–36.0)
MCV: 85.8 fL (ref 78.0–100.0)
Platelets: 192 10*3/uL (ref 150–400)
RBC: 3.52 MIL/uL — ABNORMAL LOW (ref 3.87–5.11)
RDW: 13.8 % (ref 11.5–15.5)
WBC: 9.8 10*3/uL (ref 4.0–10.5)

## 2015-10-03 LAB — URINE CULTURE

## 2015-10-03 LAB — EXPECTORATED SPUTUM ASSESSMENT W REFEX TO RESP CULTURE

## 2015-10-03 LAB — EXPECTORATED SPUTUM ASSESSMENT W GRAM STAIN, RFLX TO RESP C

## 2015-10-03 MED ORDER — GUAIFENESIN-DM 100-10 MG/5ML PO SYRP
5.0000 mL | ORAL_SOLUTION | ORAL | Status: DC | PRN
  Administered 2015-10-03: 5 mL via ORAL
  Filled 2015-10-03: qty 10

## 2015-10-03 MED ORDER — DM-GUAIFENESIN ER 30-600 MG PO TB12
1.0000 | ORAL_TABLET | Freq: Two times a day (BID) | ORAL | Status: DC
  Administered 2015-10-03 – 2015-10-06 (×7): 1 via ORAL
  Filled 2015-10-03 (×8): qty 1

## 2015-10-03 MED ORDER — IPRATROPIUM-ALBUTEROL 0.5-2.5 (3) MG/3ML IN SOLN
3.0000 mL | Freq: Four times a day (QID) | RESPIRATORY_TRACT | Status: DC
  Administered 2015-10-03: 3 mL via RESPIRATORY_TRACT
  Filled 2015-10-03: qty 3

## 2015-10-03 MED ORDER — IPRATROPIUM-ALBUTEROL 0.5-2.5 (3) MG/3ML IN SOLN
3.0000 mL | Freq: Three times a day (TID) | RESPIRATORY_TRACT | Status: DC
  Administered 2015-10-03 – 2015-10-06 (×8): 3 mL via RESPIRATORY_TRACT
  Filled 2015-10-03 (×8): qty 3

## 2015-10-03 MED ORDER — ACETAMINOPHEN 325 MG PO TABS
650.0000 mg | ORAL_TABLET | Freq: Four times a day (QID) | ORAL | Status: DC | PRN
  Administered 2015-10-03 – 2015-10-05 (×2): 650 mg via ORAL
  Filled 2015-10-03 (×2): qty 2

## 2015-10-03 NOTE — Progress Notes (Signed)
Utilization review completed.  

## 2015-10-03 NOTE — Progress Notes (Signed)
PROGRESS NOTE  [name] [id number] DOB: 21-Nov-1930 DOA: 10/01/2015 PCP: Merrilee Seashore, MD Outpatient Specialists:    LOS: 2 days   Brief Narrative: 82 y.o. female with [name] history of hypertension, diabetes, hypothyroidism, COPD, presented to the emergency department with complaints of cough and feeling weak for the last 2-3 days  Assessment & Plan: Active Problems:   Essential hypertension   ZENKER'S DIVERTICULUM   Diabetes mellitus ([place])   CAP (community acquired pneumonia)   Hyponatremia   Sepsis ([place])   Hypothyroidism   Sepsis secondary to community acquired pneumonia - Upon admission, patient was febrile, tachycardic, with leukocytosis - Sepsis physiology improving, afebrile today - Chest x-ray on admission: COPD changes with right upper lobe opacity question pneumonia - Placed on azithromycin and ceftriaxone, continue - Influenza PCR negative, respiratory viral panel pending - 1/2 blood cultures with GPC in chains, vancomycin added, continue until speciation  COPD - Patient with slight wheezing, adding DuoNeb's - Continue prednisone, Singulair - Patient follows with Dr. Halford Chessman  Hypokalemia - Will replace and continue monitor BMP  Chronic hyponatremia - Will check TSH - Patient has had low sodium in the past - Sodium improved with IV fluids  Diabetes mellitus, type II - Patient has allergies to Lantus and NovoLog - Resume her home medications - states her allergy to insulin is "sinus congestion"  Hypothyroidism - Continue Synthroid  Essential hypertension - Will hold Maxide today as well  GERD/Zenker's diverticulum - Continue PPI  Seasonal allergies - Continue Flonase and Zyrtec  Legal blindness   DVT prophylaxis: Lovenox Code Status: Full code Family Communication: No family bedside Disposition Plan: Home when ready Barriers for discharge: Sepsis, IV antibiotics  Consultants:   None  Procedures:   None    Antimicrobials:  Ceftriaxone 4/7 >>  Azithromycin 4/7 >>  Vancomycin 4/8 >>   Subjective: - States that she has [name] headache this morning, also endorses worsening sinus pressure and sinus congestion. Has an ongoing productive cough  Objective: Filed Vitals:   10/02/15 1444 10/02/15 2053 10/02/15 2207 10/03/15 0534  BP: 117/36 81/64 141/48 129/48  Pulse: 83 78  82  Temp: 98.6 F (37 C) 98.6 F (37 C)  98.4 F (36.9 C)  TempSrc: Oral Oral  Oral  Resp: 18 18  16   Height:      Weight:      SpO2: 94% 99%  99%    Intake/Output Summary (Last 24 hours) at 10/03/15 1101 Last data filed at 10/03/15 0535  Gross per 24 hour  Intake 2383.2 ml  Output    950 ml  Net 1433.2 ml   Filed Weights   10/01/15 0753 10/01/15 1211  Weight: 61.236 kg (135 lb) 61.236 kg (135 lb)    Examination: Constitutional: NAD, Comfortable, eating breakfast Filed Vitals:   10/02/15 1444 10/02/15 2053 10/02/15 2207 10/03/15 0534  BP: 117/36 81/64 141/48 129/48  Pulse: 83 78  82  Temp: 98.6 F (37 C) 98.6 F (37 C)  98.4 F (36.9 C)  TempSrc: Oral Oral  Oral  Resp: 18 18  16   Height:      Weight:      SpO2: 94% 99%  99%   Eyes: PERRL, lids and conjunctivae normal ENMT: Mucous membranes are moist. Posterior pharynx clear of any exudate or lesions, Mildly erythematous Respiratory: clear to auscultation bilaterally, no wheezing, no crackles. Normal respiratory effort. No accessory muscle use.  Cardiovascular: Regular rate and rhythm,3/6 SEM. No extremity edema.  Abdomen: no tenderness, Bowel sounds  positive.  Musculoskeletal: no clubbing / cyanosis.  Skin: no rashes, lesions, ulcers. No induration Neurologic: Nonfocal    Data Reviewed: I have personally reviewed following labs and imaging studies  CBC:  Recent Labs Lab 10/01/15 0837 10/03/15 0405  WBC 18.1* 9.8  NEUTROABS 14.6*  --   HGB 13.4 10.3*  HCT 39.7 30.2*  MCV 83.8 85.8  PLT 242 [id number]   Basic Metabolic Panel:  Recent  Labs Lab 10/01/15 0925 10/02/15 0413 10/02/15 1428 10/03/15 0405  NA 129*  --  131* 133*  K 3.1*  --  4.0 3.8  CL 92*  --  96* 101  CO2 26  --  26 24  GLUCOSE 175*  --  160* 160*  BUN 25*  --  13 10  CREATININE 0.84 0.64 0.64 0.58  CALCIUM 8.4*  --  8.5* 8.2*   GFR: Estimated Creatinine Clearance: 46.6 mL/min (by C-G formula based on Cr of 0.58). Liver Function Tests:  Recent Labs Lab 10/01/15 0925  AST 18  ALT 17  ALKPHOS 57  BILITOT 0.6  PROT 6.9  ALBUMIN 3.8   No results for input(s): LIPASE, AMYLASE in the last 168 hours. No results for input(s): AMMONIA in the last 168 hours. Coagulation Profile:  Recent Labs Lab 10/02/15 0413  INR 1.18   Cardiac Enzymes: No results for input(s): CKTOTAL, CKMB, CKMBINDEX, TROPONINI in the last 168 hours. BNP (last 3 results)  Recent Labs  01/18/15 1542 01/29/15 1553 02/02/15 1156  PROBNP 75.0 40.0 150.0*   HbA1C: No results for input(s): HGBA1C in the last 72 hours. CBG:  Recent Labs Lab 10/02/15 0759 10/02/15 1137 10/02/15 1625 10/02/15 2056 10/03/15 0752  GLUCAP 190* 316* 163* 148* 138*   Lipid Profile: No results for input(s): CHOL, HDL, LDLCALC, TRIG, CHOLHDL, LDLDIRECT in the last 72 hours. Thyroid Function Tests:  Recent Labs  10/02/15 0413  TSH 3.109   Anemia Panel: No results for input(s): VITAMINB12, FOLATE, FERRITIN, TIBC, IRON, RETICCTPCT in the last 72 hours. Urine analysis:    Component Value Date/Time   COLORURINE YELLOW 10/01/2015 1400   APPEARANCEUR CLEAR 10/01/2015 1400   LABSPEC 1.012 10/01/2015 1400   PHURINE 6.0 10/01/2015 1400   GLUCOSEU NEGATIVE 10/01/2015 1400   GLUCOSEU NEGATIVE 03/24/2010 1137   HGBUR NEGATIVE 10/01/2015 1400   BILIRUBINUR NEGATIVE 10/01/2015 1400   KETONESUR NEGATIVE 10/01/2015 1400   PROTEINUR NEGATIVE 10/01/2015 1400   UROBILINOGEN 0.2 05/01/2015 2019   NITRITE NEGATIVE 10/01/2015 1400   LEUKOCYTESUR TRACE* 10/01/2015 1400   Sepsis  Labs: Invalid input(s): PROCALCITONIN, LACTICIDVEN  Recent Results (from the past 240 hour(s))  Blood Culture (routine x 2)     Status: None (Preliminary result)   Collection Time: 10/01/15  8:33 AM  Result Value Ref Range Status   Specimen Description BLOOD LEFT ANTECUBITAL  Final   Special Requests BOTTLES DRAWN AEROBIC AND ANAEROBIC 5ML  Final   Culture  Setup Time   Final    GRAM POSITIVE COCCI IN CHAINS IN BOTH AEROBIC AND ANAEROBIC BOTTLES CRITICAL RESULT CALLED TO, READ BACK BY AND VERIFIED WITH: CLARK,RN @2343  10/01/15 MKELLY    Culture   Final    GRAM POSITIVE COCCI CULTURE REINCUBATED FOR BETTER GROWTH Performed at [facility]    Report Status PENDING  Incomplete  Culture, sputum-assessment     Status: None   Collection Time: 10/02/15  1:35 PM  Result Value Ref Range Status   Specimen Description SPUTUM  Final   Special Requests NONE  Final   Sputum evaluation   Final    THIS SPECIMEN IS ACCEPTABLE. RESPIRATORY CULTURE REPORT TO FOLLOW.   Report Status 10/03/2015 FINAL  Final      Radiology Studies: No results found.   Scheduled Meds: . aspirin EC  81 mg Oral Daily  . azithromycin  500 mg Intravenous Q24H  . cefTRIAXone (ROCEPHIN)  IV  1 g Intravenous Q24H  . dextromethorphan-guaiFENesin  1 tablet Oral BID  . enoxaparin (LOVENOX) injection  40 mg Subcutaneous Q24H  . imipramine  75 mg Oral QHS  . insulin lispro  4 Units Subcutaneous TID WC  . ipratropium-albuterol  3 mL Nebulization Q6H  . levothyroxine  100 mcg Oral QAC breakfast  . loratadine  10 mg Oral Daily  . montelukast  10 mg Oral Q breakfast  . pantoprazole  40 mg Oral Daily  . predniSONE  5 mg Oral Q breakfast  . saccharomyces boulardii  250 mg Oral Daily  . vancomycin  500 mg Intravenous Q12H   Continuous Infusions:     Marzetta Board, MD, PhD [facility] Pager [phone]  If 7PM-7AM, please contact night-coverage [email or web address] Password [email or web address] 10/03/2015, 11:01 AM

## 2015-10-04 LAB — GLUCOSE, CAPILLARY
Glucose-Capillary: 166 mg/dL — ABNORMAL HIGH (ref 65–99)
Glucose-Capillary: 225 mg/dL — ABNORMAL HIGH (ref 65–99)
Glucose-Capillary: 166 mg/dL — ABNORMAL HIGH (ref 65–99)
Glucose-Capillary: 178 mg/dL — ABNORMAL HIGH (ref 65–99)

## 2015-10-04 LAB — RESPIRATORY VIRUS PANEL
Adenovirus: NEGATIVE
Influenza A: NEGATIVE
Influenza B: NEGATIVE
Metapneumovirus: NEGATIVE
Parainfluenza 1: NEGATIVE
Parainfluenza 2: NEGATIVE
Parainfluenza 3: NEGATIVE
Respiratory Syncytial Virus A: NEGATIVE
Respiratory Syncytial Virus B: NEGATIVE
Rhinovirus: POSITIVE — AB

## 2015-10-04 LAB — LEGIONELLA PNEUMOPHILA SEROGP 1 UR AG: L. pneumophila Serogp 1 Ur Ag: NEGATIVE

## 2015-10-04 MED ORDER — DOXYCYCLINE HYCLATE 100 MG PO TABS
100.0000 mg | ORAL_TABLET | Freq: Two times a day (BID) | ORAL | Status: DC
Start: 2015-10-04 — End: 2015-10-06
  Administered 2015-10-04 – 2015-10-06 (×4): 100 mg via ORAL
  Filled 2015-10-04 (×4): qty 1

## 2015-10-04 NOTE — Care Management Important Message (Signed)
Important Message  Patient Details IM Letter given to Nora/Case Manager to present to Patient Name: [name] MRN: [id number] Date of Birth: [birth year]   Medicare Important Message Given:  Yes    Camillo Flaming 10/04/2015, 10:52 AMImportant Message  Patient Details  Name: [name] MRN: [id number] Date of Birth: Mar 20, 1931   Medicare Important Message Given:  Yes    Camillo Flaming 10/04/2015, 10:52 AM

## 2015-10-04 NOTE — Progress Notes (Signed)
PROGRESS NOTE  [name] [id number] DOB: 08/27/1930 DOA: 10/01/2015 PCP: Merrilee Seashore, MD Outpatient Specialists:    LOS: 3 days   Brief Narrative: 82 y.o. female with a history of hypertension, diabetes, hypothyroidism, COPD, presented to the emergency department with complaints of cough and feeling weak for the last 2-3 days  Assessment & Plan: Active Problems:   Essential hypertension   ZENKER'S DIVERTICULUM   Diabetes mellitus ([place])   CAP (community acquired pneumonia)   Hyponatremia   Sepsis ([place])   Hypothyroidism   Sepsis secondary to community acquired pneumonia - Upon admission, patient was febrile, tachycardic, with leukocytosis - Sepsis physiology resolved, afebrile and white count normalized,  - Chest x-ray on admission: COPD changes with right upper lobe opacity question pneumonia - Placed on azithromycin and ceftriaxone, narrow today to doxycycline as clinically she is improved with - Influenza PCR negative, respiratory viral panel pending - 1/2 blood cultures with viridans strep, likely contaminant, discontinue vancomycin today   COPD - Patient with slight wheezing, adding DuoNeb's - Continue prednisone, Singulair - Patient follows with Dr. Halford Chessman - Wheezing resolved today   Hypokalemia - Will replace and continue monitor BMP  Chronic hyponatremia - TSH normal - Patient has had low sodium in the past - Sodium improved with IV fluids  Diabetes mellitus, type II - Patient has allergies to Lantus and NovoLog - Resume her home medications - states her allergy to insulin is "sinus congestion" - CBG is within 160s to 220s today   Hypothyroidism - Continue Synthroid  Essential hypertension - Will hold Maxide today as well  GERD/Zenker's diverticulum - Continue PPI  Seasonal allergies - Continue Flonase and Zyrtec  Legal blindness   DVT prophylaxis: Lovenox Code Status: Full code Family Communication: No family  bedside Disposition Plan: Home when ready likely one day Barriers for discharge: Monitor clinically   Consultants:   None  Procedures:   None   Antimicrobials:  Ceftriaxone 4/7 >>  Azithromycin 4/7 >>  Vancomycin 4/8 >>  4/10   Subjective: - feeling better this morning, denies any shortness of breath, continues to endorse sinus congestion and a productive cough   Objective: Filed Vitals:   10/03/15 2117 10/04/15 0544 10/04/15 1349 10/04/15 1400  BP:  130/48 130/64   Pulse:  74 70   Temp:  97.6 F (36.4 C) 98 F (36.7 C)   TempSrc:  Oral Oral   Resp:  16 16   Height:      Weight:      SpO2: 99% 97% 98% 98%    Intake/Output Summary (Last 24 hours) at 10/04/15 1430 Last data filed at 10/04/15 1300  Gross per 24 hour  Intake   1817 ml  Output   1200 ml  Net    617 ml   Filed Weights   10/01/15 0753 10/01/15 1211  Weight: 61.236 kg (135 lb) 61.236 kg (135 lb)    Examination: Constitutional: NAD, Comfortable, eating breakfast Filed Vitals:   10/03/15 2117 10/04/15 0544 10/04/15 1349 10/04/15 1400  BP:  130/48 130/64   Pulse:  74 70   Temp:  97.6 F (36.4 C) 98 F (36.7 C)   TempSrc:  Oral Oral   Resp:  16 16   Height:      Weight:      SpO2: 99% 97% 98% 98%   Eyes: PERRL, lids and conjunctivae normal ENMT: Mucous membranes are moist. Posterior pharynx clear of any exudate or lesions, Mildly erythematous Respiratory: clear to auscultation bilaterally,  no wheezing, no crackles. Normal respiratory effort. No accessory muscle use.  Cardiovascular: Regular rate and rhythm,3/6 SEM. No extremity edema.  Abdomen: no tenderness, Bowel sounds positive.  Neurologic: Nonfocal    Data Reviewed: I have personally reviewed following labs and imaging studies  CBC:  Recent Labs Lab 10/01/15 0837 10/03/15 0405  WBC 18.1* 9.8  NEUTROABS 14.6*  --   HGB 13.4 10.3*  HCT 39.7 30.2*  MCV 83.8 85.8  PLT 242 [id number]   Basic Metabolic Panel:  Recent Labs Lab  10/01/15 0925 10/02/15 0413 10/02/15 1428 10/03/15 0405  NA 129*  --  131* 133*  K 3.1*  --  4.0 3.8  CL 92*  --  96* 101  CO2 26  --  26 24  GLUCOSE 175*  --  160* 160*  BUN 25*  --  13 10  CREATININE 0.84 0.64 0.64 0.58  CALCIUM 8.4*  --  8.5* 8.2*   GFR: Estimated Creatinine Clearance: 46.6 mL/min (by C-G formula based on Cr of 0.58). Liver Function Tests:  Recent Labs Lab 10/01/15 0925  AST 18  ALT 17  ALKPHOS 57  BILITOT 0.6  PROT 6.9  ALBUMIN 3.8   No results for input(s): LIPASE, AMYLASE in the last 168 hours. No results for input(s): AMMONIA in the last 168 hours. Coagulation Profile:  Recent Labs Lab 10/02/15 0413  INR 1.18   Cardiac Enzymes: No results for input(s): CKTOTAL, CKMB, CKMBINDEX, TROPONINI in the last 168 hours. BNP (last 3 results)  Recent Labs  01/18/15 1542 01/29/15 1553 02/02/15 1156  PROBNP 75.0 40.0 150.0*   HbA1C: No results for input(s): HGBA1C in the last 72 hours. CBG:  Recent Labs Lab 10/03/15 1114 10/03/15 1750 10/03/15 2102 10/04/15 0728 10/04/15 1158  GLUCAP 248* 182* 175* 166* 225*   Lipid Profile: No results for input(s): CHOL, HDL, LDLCALC, TRIG, CHOLHDL, LDLDIRECT in the last 72 hours. Thyroid Function Tests:  Recent Labs  10/02/15 0413  TSH 3.109   Anemia Panel: No results for input(s): VITAMINB12, FOLATE, FERRITIN, TIBC, IRON, RETICCTPCT in the last 72 hours. Urine analysis:    Component Value Date/Time   COLORURINE YELLOW 10/01/2015 1400   APPEARANCEUR CLEAR 10/01/2015 1400   LABSPEC 1.012 10/01/2015 1400   PHURINE 6.0 10/01/2015 1400   GLUCOSEU NEGATIVE 10/01/2015 1400   GLUCOSEU NEGATIVE 03/24/2010 1137   HGBUR NEGATIVE 10/01/2015 1400   BILIRUBINUR NEGATIVE 10/01/2015 1400   KETONESUR NEGATIVE 10/01/2015 1400   PROTEINUR NEGATIVE 10/01/2015 1400   UROBILINOGEN 0.2 05/01/2015 2019   NITRITE NEGATIVE 10/01/2015 1400   LEUKOCYTESUR TRACE* 10/01/2015 1400   Sepsis Labs: Invalid  input(s): PROCALCITONIN, LACTICIDVEN  Recent Results (from the past 240 hour(s))  Blood Culture (routine x 2)     Status: Abnormal (Preliminary result)   Collection Time: 10/01/15  8:33 AM  Result Value Ref Range Status   Specimen Description BLOOD LEFT ANTECUBITAL  Final   Special Requests BOTTLES DRAWN AEROBIC AND ANAEROBIC 5ML  Final   Culture  Setup Time   Final    GRAM POSITIVE COCCI IN CHAINS IN BOTH AEROBIC AND ANAEROBIC BOTTLES CRITICAL RESULT CALLED TO, READ BACK BY AND VERIFIED WITH: CLARK,RN @2343  10/01/15 MKELLY    Culture (A)  Final    VIRIDANS STREPTOCOCCUS THE SIGNIFICANCE OF ISOLATING THIS ORGANISM FROM A SINGLE SET OF BLOOD CULTURES WHEN MULTIPLE SETS ARE DRAWN IS UNCERTAIN. PLEASE NOTIFY THE MICROBIOLOGY DEPARTMENT WITHIN ONE WEEK IF SPECIATION AND SENSITIVITIES ARE REQUIRED. Performed at [facility]  Report Status PENDING  Incomplete  Urine culture     Status: None   Collection Time: 10/02/15  3:29 AM  Result Value Ref Range Status   Specimen Description URINE, CLEAN CATCH  Final   Special Requests NONE  Final   Culture MULTIPLE SPECIES PRESENT, SUGGEST RECOLLECTION  Final   Report Status 10/03/2015 FINAL  Final  Culture, sputum-assessment     Status: None   Collection Time: 10/02/15  1:35 PM  Result Value Ref Range Status   Specimen Description SPUTUM  Final   Special Requests NONE  Final   Sputum evaluation   Final    THIS SPECIMEN IS ACCEPTABLE. RESPIRATORY CULTURE REPORT TO FOLLOW.   Report Status 10/03/2015 FINAL  Final  Culture, respiratory (NON-Expectorated)     Status: None (Preliminary result)   Collection Time: 10/02/15  1:35 PM  Result Value Ref Range Status   Specimen Description SPUTUM  Final   Special Requests NONE  Final   Gram Stain   Final    ABUNDANT WBC PRESENT, PREDOMINANTLY PMN RARE SQUAMOUS EPITHELIAL CELLS PRESENT NO ORGANISMS SEEN THIS SPECIMEN IS ACCEPTABLE FOR SPUTUM CULTURE Performed at [facility]     Culture   Final    NORMAL OROPHARYNGEAL FLORA Performed at [facility]    Report Status PENDING  Incomplete      Radiology Studies: No results found.   Scheduled Meds: . aspirin EC  81 mg Oral Daily  . azithromycin  500 mg Intravenous Q24H  . cefTRIAXone (ROCEPHIN)  IV  1 g Intravenous Q24H  . dextromethorphan-guaiFENesin  1 tablet Oral BID  . enoxaparin (LOVENOX) injection  40 mg Subcutaneous Q24H  . imipramine  75 mg Oral QHS  . insulin lispro  4 Units Subcutaneous TID WC  . ipratropium-albuterol  3 mL Nebulization TID  . levothyroxine  100 mcg Oral QAC breakfast  . loratadine  10 mg Oral Daily  . montelukast  10 mg Oral Q breakfast  . pantoprazole  40 mg Oral Daily  . predniSONE  5 mg Oral Q breakfast  . saccharomyces boulardii  250 mg Oral Daily   Continuous Infusions:     Marzetta Board, MD, PhD [facility] Pager [phone]  If 7PM-7AM, please contact night-coverage [email or web address] Password [email or web address] 10/04/2015, 2:30 PM

## 2015-10-05 ENCOUNTER — Encounter (HOSPITAL_COMMUNITY): Payer: Self-pay | Admitting: Radiology

## 2015-10-05 ENCOUNTER — Inpatient Hospital Stay (HOSPITAL_COMMUNITY): Payer: Medicare Other

## 2015-10-05 LAB — GLUCOSE, CAPILLARY
Glucose-Capillary: 109 mg/dL — ABNORMAL HIGH (ref 65–99)
Glucose-Capillary: 151 mg/dL — ABNORMAL HIGH (ref 65–99)
Glucose-Capillary: 180 mg/dL — ABNORMAL HIGH (ref 65–99)
Glucose-Capillary: 140 mg/dL — ABNORMAL HIGH (ref 65–99)

## 2015-10-05 LAB — CULTURE, RESPIRATORY W GRAM STAIN

## 2015-10-05 LAB — CULTURE, RESPIRATORY: Culture: NORMAL

## 2015-10-05 NOTE — Progress Notes (Signed)
PROGRESS NOTE  [name] [id number] DOB: 1931/03/13 DOA: 10/01/2015 PCP: Merrilee Seashore, MD Outpatient Specialists:    LOS: 4 days   Brief Narrative: 82 y.o. female with a history of hypertension, diabetes, hypothyroidism, COPD, presented to the emergency department with complaints of cough and feeling weak for the last 2-3 days  Interim summary - Patient was admitted on 4/7 with community-acquired pneumonia as well as flulike illness. She was started on broad-spectrum antibiotics with ceftriaxone and azithromycin, with improvement in his respiratory status. She initially required 2 L nasal cannula and this was able to be weaned off on room air. Her antibiotics for the pneumonia were narrowed to doxycycline and 4/10. Her influenza returned negative however she was positive for rhinovirus on the respiratory panel. Over 4/10 and 4/11, patient with progressive headaches and sinus pressure as well as retro-orbital left eye pressure for which she is to get a CT scan maxillofacial.   Assessment & Plan: Active Problems:   Essential hypertension   ZENKER'S DIVERTICULUM   Diabetes mellitus ([place])   CAP (community acquired pneumonia)   Hyponatremia   Sepsis ([place])   Hypothyroidism   Sepsis secondary to community acquired pneumonia - Upon admission, patient was febrile, tachycardic, with leukocytosis - Sepsis physiology resolved, afebrile and white count normalized,  - Chest x-ray on admission: COPD changes with right upper lobe opacity question pneumonia - Placed on azithromycin and ceftriaxone, narrow 4/10 to doxycycline - Influenza PCR negative, respiratory viral panel positive for rhinovirus - 1/2 blood cultures with viridans strep, likely contaminant, discontinued vancomycin 4/10 - This is improving, patient was considered for discharge on 4/11 however her sinus pressure had become progressively worse as below  Sinus pressure / pain on left / headaches - Progressively worse  over the last couple days, patient feels pressure behind the left eye, on exam looks a little bit more swollen on the left, will obtain CT maxillofacial  COPD - Patient with slight wheezing, adding DuoNeb's - Continue prednisone, Singulair - Patient follows with Dr. Halford Chessman - Wheezing resolved   Hypokalemia - Replaced, repeat BMP tomorrow morning  Chronic hyponatremia - TSH normal - Patient has had low sodium in the past - Sodium improved with IV fluids, repeat BMP 4/12  Diabetes mellitus, type II - Patient has allergies to Lantus and NovoLog - Resume her home medications - states her allergy to insulin is "sinus congestion" - CBG is within 109s to 178s today   Hypothyroidism - Continue Synthroid  Essential hypertension - Will hold Maxide today as well  GERD/Zenker's diverticulum - Continue PPI  Seasonal allergies - Continue Flonase and Zyrtec  Legal blindness   DVT prophylaxis: Lovenox Code Status: Full code Family Communication: No family bedside Disposition Plan: Home when ready, 1-2 days Barriers for discharge: worsening sinus pressure/HA  Consultants:   None  Procedures:   None   Antimicrobials:  Ceftriaxone 4/7 >> 4/10  Azithromycin 4/7 >> 4/10  Vancomycin 4/8 >>  4/10  Doxycycline 4/10 >>    Subjective: - respiratory wise feeling better, endorses sinus congestion, HA and severe pressure on left   Objective: Filed Vitals:   10/04/15 1400 10/04/15 2256 10/05/15 0449 10/05/15 0903  BP:  140/59 123/47   Pulse:  74 74   Temp:  98.2 F (36.8 C) 98.3 F (36.8 C)   TempSrc:  Oral Oral   Resp:  16 16   Height:      Weight:      SpO2: 98% 96% 95% 90%  Intake/Output Summary (Last 24 hours) at 10/05/15 1059 Last data filed at 10/05/15 0648  Gross per 24 hour  Intake    717 ml  Output   2800 ml  Net  -2083 ml   Filed Weights   10/01/15 0753 10/01/15 1211  Weight: 61.236 kg (135 lb) 61.236 kg (135 lb)    Examination: Constitutional:  NAD, Comfortable, eating breakfast Filed Vitals:   10/04/15 1400 10/04/15 2256 10/05/15 0449 10/05/15 0903  BP:  140/59 123/47   Pulse:  74 74   Temp:  98.2 F (36.8 C) 98.3 F (36.8 C)   TempSrc:  Oral Oral   Resp:  16 16   Height:      Weight:      SpO2: 98% 96% 95% 90%   Eyes: PERRL, lids and conjunctivae normal ENMT: Mucous membranes are moist. Posterior pharynx clear of any exudate or lesions, Mildly erythematous. Facial tenderness of left maxillary, ethmoid and frontal sinuses Respiratory: clear to auscultation bilaterally, no wheezing, no crackles. Normal respiratory effort. No accessory muscle use.  Cardiovascular: Regular rate and rhythm, 3/6 SEM. No extremity edema.  Abdomen: no tenderness, Bowel sounds positive.  Neurologic: Nonfocal  Data Reviewed: I have personally reviewed following labs and imaging studies  CBC:  Recent Labs Lab 10/01/15 0837 10/03/15 0405  WBC 18.1* 9.8  NEUTROABS 14.6*  --   HGB 13.4 10.3*  HCT 39.7 30.2*  MCV 83.8 85.8  PLT 242 [id number]   Basic Metabolic Panel:  Recent Labs Lab 10/01/15 0925 10/02/15 0413 10/02/15 1428 10/03/15 0405  NA 129*  --  131* 133*  K 3.1*  --  4.0 3.8  CL 92*  --  96* 101  CO2 26  --  26 24  GLUCOSE 175*  --  160* 160*  BUN 25*  --  13 10  CREATININE 0.84 0.64 0.64 0.58  CALCIUM 8.4*  --  8.5* 8.2*   GFR: Estimated Creatinine Clearance: 46.6 mL/min (by C-G formula based on Cr of 0.58). Liver Function Tests:  Recent Labs Lab 10/01/15 0925  AST 18  ALT 17  ALKPHOS 57  BILITOT 0.6  PROT 6.9  ALBUMIN 3.8   No results for input(s): LIPASE, AMYLASE in the last 168 hours. No results for input(s): AMMONIA in the last 168 hours. Coagulation Profile:  Recent Labs Lab 10/02/15 0413  INR 1.18   Cardiac Enzymes: No results for input(s): CKTOTAL, CKMB, CKMBINDEX, TROPONINI in the last 168 hours. BNP (last 3 results)  Recent Labs  01/18/15 1542 01/29/15 1553 02/02/15 1156  PROBNP 75.0 40.0  150.0*   HbA1C: No results for input(s): HGBA1C in the last 72 hours. CBG:  Recent Labs Lab 10/04/15 0728 10/04/15 1158 10/04/15 1723 10/04/15 2232 10/05/15 0748  GLUCAP 166* 225* 166* 178* 109*   Lipid Profile: No results for input(s): CHOL, HDL, LDLCALC, TRIG, CHOLHDL, LDLDIRECT in the last 72 hours. Thyroid Function Tests: No results for input(s): TSH, T4TOTAL, FREET4, T3FREE, THYROIDAB in the last 72 hours. Anemia Panel: No results for input(s): VITAMINB12, FOLATE, FERRITIN, TIBC, IRON, RETICCTPCT in the last 72 hours. Urine analysis:    Component Value Date/Time   COLORURINE YELLOW 10/01/2015 1400   APPEARANCEUR CLEAR 10/01/2015 1400   LABSPEC 1.012 10/01/2015 1400   PHURINE 6.0 10/01/2015 1400   GLUCOSEU NEGATIVE 10/01/2015 1400   GLUCOSEU NEGATIVE 03/24/2010 1137   HGBUR NEGATIVE 10/01/2015 1400   BILIRUBINUR NEGATIVE 10/01/2015 1400   KETONESUR NEGATIVE 10/01/2015 1400   PROTEINUR NEGATIVE 10/01/2015 1400  UROBILINOGEN 0.2 05/01/2015 2019   NITRITE NEGATIVE 10/01/2015 1400   LEUKOCYTESUR TRACE* 10/01/2015 1400   Sepsis Labs: Invalid input(s): PROCALCITONIN, LACTICIDVEN  Recent Results (from the past 240 hour(s))  Blood Culture (routine x 2)     Status: Abnormal (Preliminary result)   Collection Time: 10/01/15  8:33 AM  Result Value Ref Range Status   Specimen Description BLOOD LEFT ANTECUBITAL  Final   Special Requests BOTTLES DRAWN AEROBIC AND ANAEROBIC 5ML  Final   Culture  Setup Time   Final    GRAM POSITIVE COCCI IN CHAINS IN BOTH AEROBIC AND ANAEROBIC BOTTLES CRITICAL RESULT CALLED TO, READ BACK BY AND VERIFIED WITH: CLARK,RN @2343  10/01/15 MKELLY    Culture (A)  Final    VIRIDANS STREPTOCOCCUS THE SIGNIFICANCE OF ISOLATING THIS ORGANISM FROM A SINGLE SET OF BLOOD CULTURES WHEN MULTIPLE SETS ARE DRAWN IS UNCERTAIN. PLEASE NOTIFY THE MICROBIOLOGY DEPARTMENT WITHIN ONE WEEK IF SPECIATION AND SENSITIVITIES ARE REQUIRED. Performed at [facility]    Report Status PENDING  Incomplete  Urine culture     Status: None   Collection Time: 10/02/15  3:29 AM  Result Value Ref Range Status   Specimen Description URINE, CLEAN CATCH  Final   Special Requests NONE  Final   Culture MULTIPLE SPECIES PRESENT, SUGGEST RECOLLECTION  Final   Report Status 10/03/2015 FINAL  Final  Respiratory virus panel     Status: Abnormal   Collection Time: 10/02/15  1:14 PM  Result Value Ref Range Status   Source - RVPAN NOSE  Corrected   Respiratory Syncytial Virus A Negative Negative Final   Respiratory Syncytial Virus B Negative Negative Final   Influenza A Negative Negative Final   Influenza B Negative Negative Final   Parainfluenza 1 Negative Negative Final   Parainfluenza 2 Negative Negative Final   Parainfluenza 3 Negative Negative Final   Metapneumovirus Negative Negative Final   Rhinovirus Positive (A) Negative Final   Adenovirus Negative Negative Final    Comment: (NOTE) Performed At: [facility] [address] [place], [place] [id number] Lindon Romp MD [id number]   Culture, sputum-assessment     Status: None   Collection Time: 10/02/15  1:35 PM  Result Value Ref Range Status   Specimen Description SPUTUM  Final   Special Requests NONE  Final   Sputum evaluation   Final    THIS SPECIMEN IS ACCEPTABLE. RESPIRATORY CULTURE REPORT TO FOLLOW.   Report Status 10/03/2015 FINAL  Final  Culture, respiratory (NON-Expectorated)     Status: None   Collection Time: 10/02/15  1:35 PM  Result Value Ref Range Status   Specimen Description SPUTUM  Final   Special Requests NONE  Final   Gram Stain   Final    ABUNDANT WBC PRESENT, PREDOMINANTLY PMN RARE SQUAMOUS EPITHELIAL CELLS PRESENT NO ORGANISMS SEEN THIS SPECIMEN IS ACCEPTABLE FOR SPUTUM CULTURE Performed at [facility]    Culture   Final    NORMAL OROPHARYNGEAL FLORA Performed at [facility]    Report Status 10/05/2015 FINAL  Final       Radiology Studies: No results found.   Scheduled Meds: . aspirin EC  81 mg Oral Daily  . dextromethorphan-guaiFENesin  1 tablet Oral BID  . doxycycline  100 mg Oral Q12H  . enoxaparin (LOVENOX) injection  40 mg Subcutaneous Q24H  . imipramine  75 mg Oral QHS  . insulin lispro  4 Units Subcutaneous TID WC  . ipratropium-albuterol  3 mL Nebulization TID  .  levothyroxine  100 mcg Oral QAC breakfast  . loratadine  10 mg Oral Daily  . montelukast  10 mg Oral Q breakfast  . pantoprazole  40 mg Oral Daily  . predniSONE  5 mg Oral Q breakfast  . saccharomyces boulardii  250 mg Oral Daily   Continuous Infusions:     Marzetta Board, MD, PhD [facility] Pager [phone]  If 7PM-7AM, please contact night-coverage [email or web address] Password [email or web address] 10/05/2015, 10:59 AM

## 2015-10-06 DIAGNOSIS — K225 Diverticulum of esophagus, acquired: Secondary | ICD-10-CM

## 2015-10-06 DIAGNOSIS — J189 Pneumonia, unspecified organism: Secondary | ICD-10-CM

## 2015-10-06 DIAGNOSIS — E039 Hypothyroidism, unspecified: Secondary | ICD-10-CM

## 2015-10-06 DIAGNOSIS — I10 Essential (primary) hypertension: Secondary | ICD-10-CM

## 2015-10-06 DIAGNOSIS — E119 Type 2 diabetes mellitus without complications: Secondary | ICD-10-CM

## 2015-10-06 DIAGNOSIS — Z794 Long term (current) use of insulin: Secondary | ICD-10-CM

## 2015-10-06 DIAGNOSIS — A419 Sepsis, unspecified organism: Principal | ICD-10-CM

## 2015-10-06 LAB — CBC
HCT: 29.8 % — ABNORMAL LOW (ref 36.0–46.0)
Hemoglobin: 10.1 g/dL — ABNORMAL LOW (ref 12.0–15.0)
MCH: 28.2 pg (ref 26.0–34.0)
MCHC: 33.9 g/dL (ref 30.0–36.0)
MCV: 83.2 fL (ref 78.0–100.0)
Platelets: 217 10*3/uL (ref 150–400)
RBC: 3.58 MIL/uL — ABNORMAL LOW (ref 3.87–5.11)
RDW: 13.2 % (ref 11.5–15.5)
WBC: 8.5 10*3/uL (ref 4.0–10.5)

## 2015-10-06 LAB — CULTURE, BLOOD (ROUTINE X 2)

## 2015-10-06 LAB — BASIC METABOLIC PANEL
Anion gap: 9 (ref 5–15)
BUN: 14 mg/dL (ref 6–20)
CO2: 27 mmol/L (ref 22–32)
Calcium: 8.9 mg/dL (ref 8.9–10.3)
Chloride: 99 mmol/L — ABNORMAL LOW (ref 101–111)
Creatinine, Ser: 0.71 mg/dL (ref 0.44–1.00)
GFR calc Af Amer: >60 mL/min (ref >60)
GFR calc non Af Amer: >60 mL/min (ref >60)
Glucose, Bld: 155 mg/dL — ABNORMAL HIGH (ref 65–99)
Potassium: 4 mmol/L (ref 3.5–5.1)
Sodium: 135 mmol/L (ref 135–145)

## 2015-10-06 LAB — GLUCOSE, CAPILLARY
Glucose-Capillary: 130 mg/dL — ABNORMAL HIGH (ref 65–99)
Glucose-Capillary: 189 mg/dL — ABNORMAL HIGH (ref 65–99)

## 2015-10-06 MED ORDER — AMOXICILLIN-POT CLAVULANATE 875-125 MG PO TABS: 1.0000 | ORAL_TABLET | Freq: Two times a day (BID) | ORAL | Status: DC

## 2015-10-06 NOTE — Care Management Note (Signed)
Case Management Note  Patient Details  Name: SHAANVI RAKOWSKI MRN: [id number] Date of Birth: Jan 24, 1931  Subjective/Objective:          82 yo admitted with Sepsis          Action/Plan: From home alone.  Pt states she used [facility] for HHPT after her hospital stay in January and would like to use them again for HHPT.  [facility] rep called to give referral.  Will need HHPT orders and face to face.  No other CM needs communicated.  Expected Discharge Date:   (unknown)               Expected Discharge Plan:  [place]  In-House Referral:     Discharge planning Services  CM Consult  Post Acute Care Choice:  Home Health Choice offered to:  Patient  DME Arranged:    DME Agency:     HH Arranged:  PT [place]:  [place]  Status of Service:  In process, will continue to follow  Medicare Important Message Given:  Yes Date Medicare IM Given:    Medicare IM give by:    Date Additional Medicare IM Given:    Additional Medicare Important Message give by:     If discussed at [place] of Stay Meetings, dates discussed:    Additional Comments:  Lynnell Catalan, RN 10/06/2015, 11:53 AM [phone]

## 2015-10-06 NOTE — Evaluation (Signed)
Physical Therapy Evaluation Patient Details Name: [name] MRN: [id number] DOB: [birth year] Today's Date: 10/06/2015   History of Present Illness  Pt admitted with sepsis 2* CAP.  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  Pt would benefit from follow up HHPT to further address deficits.    Follow Up Recommendations Home health PT    Equipment Recommendations  None recommended by PT    Recommendations for Other Services       Precautions / Restrictions Precautions Precautions: Fall Precaution Comments: Pt is legally blind Restrictions Weight Bearing Restrictions: No      Mobility  Bed Mobility Overal bed mobility: Modified Independent             General bed mobility comments: Pt unassisted to EOB  Transfers Overall transfer level: Needs assistance Equipment used: None Transfers: Sit to/from Stand Sit to Stand: Supervision         General transfer comment: for saftey.  Pt paused on standing to stabilize - states she does same at home  Ambulation/Gait Ambulation/Gait assistance: Min guard;Supervision Ambulation Distance (Feet): 400 Feet Assistive device: None Gait Pattern/deviations: Step-through pattern;Shuffle;Wide base of support     General Gait Details: Multiple short standing rests required to complete task.  Pt able to step bkwd and sideways without balance loss.  Min instability noted and no loss of balance  Stairs            Wheelchair Mobility    Modified Rankin (Stroke Patients Only)       Balance Overall balance assessment: Needs assistance Sitting-balance support: Feet supported;No upper extremity supported Sitting balance-Leahy Scale: Good     Standing balance support: No upper extremity supported Standing balance-Leahy  Scale: Good                               Pertinent Vitals/Pain Pain Assessment: No/denies pain    Home Living Family/patient expects to be discharged to:: Private residence Living Arrangements: Alone Available Help at Discharge: Family Type of Home: House Home Access: Level entry     Home Layout: Two level;Able to live on main level with bedroom/bathroom Home Equipment: Walker - 4 wheels      Prior Function Level of Independence: Independent         Comments: independent with ADLs but doesn't drive so requires a ride to grocery store     Hand Dominance   Dominant Hand: Right    Extremity/Trunk Assessment   Upper Extremity Assessment: Generalized weakness           Lower Extremity Assessment: Generalized weakness      Cervical / Trunk Assessment: Normal  Communication   Communication: No difficulties  Cognition Arousal/Alertness: Awake/alert Behavior During Therapy: WFL for tasks assessed/performed Overall Cognitive Status: Within Functional Limits for tasks assessed                      General Comments      Exercises        Assessment/Plan    PT Assessment Patient needs continued PT services  PT Diagnosis Difficulty walking;Generalized weakness   PT Problem List Decreased activity tolerance;Decreased balance;Decreased strength;Decreased mobility  PT Treatment Interventions DME instruction;Gait training;Functional mobility training;Therapeutic activities;Therapeutic exercise;Balance training;Patient/family education   PT  Goals (Current goals can be found in the Care Plan section) Acute Rehab PT Goals Patient Stated Goal: HOME PT Goal Formulation: With patient Time For Goal Achievement: 10/09/15 Potential to Achieve Goals: Good    Frequency Min 3X/week   Barriers to discharge        Co-evaluation               End of Session Equipment Utilized During Treatment: Gait belt Activity Tolerance: Patient tolerated  treatment well Patient left: in chair;with call bell/phone within reach;with chair alarm set Nurse Communication: Mobility status         Time: [id number] PT Time Calculation (min) (ACUTE ONLY): 26 min   Charges:   PT Evaluation $PT Eval Low Complexity: 1 Procedure PT Treatments $Gait Training: 8-22 mins   PT G Codes:        [name] Oct 22, 2015, 12:25 PM

## 2015-10-06 NOTE — Discharge Summary (Signed)
Physician Discharge Summary  [name] [id number] DOB: 1930/08/01 DOA: 10/01/2015  PCP: Merrilee Seashore, MD  Admit date: 10/01/2015 Discharge date: 10/06/2015  Time spent: 40 minutes  Recommendations for Outpatient Follow-up:  1. Follow-up with primary care physician within one week. 2. Continue Augmentin for 7 more days.   Discharge Diagnoses:  Active Problems:   Essential hypertension   ZENKER'S DIVERTICULUM   Diabetes mellitus ([place])   CAP (community acquired pneumonia)   Hyponatremia   Sepsis ([place])   Hypothyroidism   Discharge Condition: Stable  Diet recommendation: Heart healthy  Filed Weights   10/01/15 0753 10/01/15 1211  Weight: 61.236 kg (135 lb) 61.236 kg (135 lb)    History of present illness:  [name] is a 82 y.o. female  With a history of hypertension, diabetes, hypothyroidism, COPD, presented to the emergency department with complaints of cough and feeling weak for the last 2-3 days. Patient states that she was coughing yellow to gray sputum. She had a low grade fever at home. Last night, she stated her symptoms worsened and she felt very weak. This morning she called EMS and had to crawl to get to the phone. Patient denies any nausea or vomiting, chest pain, abdominal pain. Patient does report having sick contacts however they had more GI symptoms. In the emergency department, chest x-ray showed possible pneumonia. Patient did have leukocytosis and was febrile. [facility] called for admission.  Hospital Course:   Sepsis secondary to community acquired pneumonia - Upon admission, patient was febrile, tachycardic, with leukocytosis - Sepsis physiology resolved, afebrile and white count normalized,  - Chest x-ray on admission: COPD changes with right upper lobe opacity question pneumonia - Placed on azithromycin and ceftriaxone, narrow 4/10 to doxycycline - Influenza PCR negative, respiratory viral panel positive for rhinovirus - 1 blood  cultures with viridans strep, likely contaminant, discontinued vancomycin 4/10 - Sepsis physiology resolved, patient received 5 days of antibiotics for pneumonia. She is receiving antibiotics for sinusitis discharge.  Acute bacterial rhinosinusitis - Progressively worse over the last couple days, patient feels pressure behind the left eye, on exam looks a little bit more swollen on the left, maxillofacial CT showed mucosal swelling bilaterally consistent with rhinosinusitis. -Discharge on Augmentin for 7 more days, Mucinex and Flonase.  COPD - Patient with slight wheezing, adding DuoNeb's - Received intended course of antibiotics, received that for 7 days.  Hypokalemia - Replaced, repeat BMP tomorrow morning  Chronic hyponatremia - TSH normal - Patient has had low sodium in the past, this is improved prior to discharge sodium is 135.  Diabetes mellitus, type II - Patient has allergies to Lantus and NovoLog - Resume her home medications - states her allergy to insulin is "sinus congestion" - CBG is within 109s to 178s today   Hypothyroidism - Continue Synthroid  Essential hypertension - Will hold Maxide today as well  GERD/Zenker's diverticulum - Continue PPI  Seasonal allergies - Continue Flonase and Zyrtec  Legal blindness   Procedures:  None  Consultations:  None  Discharge Exam: Filed Vitals:   10/05/15 2150 10/06/15 0625  BP: 131/58 143/56  Pulse: 60 80  Temp: 97.9 F (36.6 C) 97.6 F (36.4 C)  Resp: 16 16   General: Alert and awake, oriented x3, not in any acute distress. HEENT: anicteric sclera, pupils reactive to light and accommodation, EOMI CVS: S1-S2 clear, no murmur rubs or gallops Chest: clear to auscultation bilaterally, no wheezing, rales or rhonchi Abdomen: soft nontender, nondistended, normal bowel sounds, no organomegaly  Extremities: no cyanosis, clubbing or edema noted bilaterally Neuro: Cranial nerves II-XII intact, no focal  neurological deficits  Discharge Instructions   Discharge Instructions    Diet - low sodium heart healthy    Complete by:  As directed      Increase activity slowly    Complete by:  As directed           Current Discharge Medication List    CONTINUE these medications which have CHANGED   Details  amoxicillin-clavulanate (AUGMENTIN) 875-125 MG tablet Take 1 tablet by mouth 2 (two) times daily. Qty: 14 tablet, Refills: 0      CONTINUE these medications which have NOT CHANGED   Details  aspirin EC 81 MG tablet Take 1 tablet (81 mg total) by mouth daily. Qty: 90 tablet, Refills: 3   Associated Diagnoses: Coronary atherosclerosis of unspecified type of vessel, native or graft    cetirizine (ZYRTEC) 10 MG tablet Take 10 mg by mouth daily after breakfast.     esomeprazole (NEXIUM) 40 MG capsule Take 40 mg by mouth daily at 12 noon.    fluticasone (FLONASE) 50 MCG/ACT nasal spray Place 1 spray into the nose daily as needed for allergies.     guaiFENesin (MUCINEX) 600 MG 12 hr tablet Take 600 mg by mouth 2 (two) times daily as needed for to loosen phlegm.    HYDROcodone-acetaminophen (NORCO) 7.5-325 MG tablet Take 0.5 tablets by mouth every 6 (six) hours as needed for moderate pain.    imipramine (TOFRANIL) 25 MG tablet Take 75 mg by mouth at bedtime.     insulin lispro (HUMALOG KWIKPEN) 100 UNIT/ML KiwkPen Inject 4-6 Units into the skin 3 (three) times daily with meals. Inject 6 units subcutaneously with breakfast and lunch, inject 4 units with supper    levothyroxine (SYNTHROID, LEVOTHROID) 100 MCG tablet Take 100 mcg by mouth daily before breakfast.    montelukast (SINGULAIR) 10 MG tablet Take 10 mg by mouth daily with breakfast.    predniSONE (DELTASONE) 5 MG tablet Take 1 tablet (5 mg total) by mouth daily with breakfast. Qty: 100 tablet, Refills: 1    saccharomyces boulardii (FLORASTOR) 250 MG capsule Take 1 capsule (250 mg total) by mouth 2 (two) times daily. Qty: 60  capsule, Refills: 3    triamterene-hydrochlorothiazide (MAXZIDE-25) 37.5-25 MG tablet Take 0.5 tablets by mouth daily.     triazolam (HALCION) 0.25 MG tablet Take 0.25 mg by mouth at bedtime as needed for sleep. For sleep       Allergies  Allergen Reactions  . Lantus [Insulin Glargine]     Patient states "sulfate binder causes sinus infection/pneumonia".  Mack [place] [Levofloxacin In D5w] Other (See Comments)    Muscle aches  . Maxidex [Dexamethasone] Other (See Comments)    Insomnia, anxiety, dizziness  . Metoprolol-Hydrochlorothiazide Other (See Comments)     decreased bp  . Norvasc [Amlodipine] Other (See Comments)    BIL Ankle edema  . Novolog [Insulin Aspart]     Patient states "sulfate binder causes sinus infection/pneumonia". Tolerates Humalog.  . Sulfonamide Derivatives Hives  . Titanium Other (See Comments)    Neck wouldn't heal with titanium cervical plate  . Adhesive [Tape] Hives and Rash      The results of significant diagnostics from this hospitalization (including imaging, microbiology, ancillary and laboratory) are listed below for reference.    Significant Diagnostic Studies: Dg Chest 2 View  10/01/2015  CLINICAL DATA:  Shortness of breath, productive cough with grey mucus, weakness, dizziness,  and fever to 102.2 degrees for 3 days worse this morning, COPD, diabetes mellitus, hypertension, coronary artery disease post MI, former smoker EXAM: CHEST  2 VIEW COMPARISON:  06/29/2015 FINDINGS: Upper normal heart size. Curvilinear density on lateral view either representing coronary arterial calcification or coronary stent. Atherosclerotic calcification aorta. Mediastinal contours and pulmonary vascularity normal. Emphysematous and bronchitic changes consistent with COPD. Hazy RIGHT upper lobe opacity question pneumonia. Remaining lungs clear. No pleural effusion or pneumothorax. Surgical clips in the LEFT cervical region question thyroid surgery. Bones demineralized.  IMPRESSION: COPD changes with hazy RIGHT upper lobe opacity question pneumonia. Electronically Signed   By: Lavonia Dana M.D.   On: 10/01/2015 09:15   Ct Maxillofacial Wo Cm  10/05/2015  CLINICAL DATA:  Left Facial pain and congestion. EXAM: CT MAXILLOFACIAL WITHOUT CONTRAST TECHNIQUE: Multidetector CT imaging of the maxillofacial structures was performed. Multiplanar CT image reconstructions were also generated. A small metallic BB was placed on the right temple in order to reliably differentiate right from left. COMPARISON:  None. FINDINGS: Mucosal edema in the maxillary sinus bilaterally right greater than left. Obstruction of the ostium of the right maxillary sinus due to mucosal edema. Mild narrowing left maxillary ostium due to mucosal edema. Mucosal edema also present in the sphenoid, ethmoid and frontal sinuses. No air-fluid levels. Nasal bone and nasal septum significantly deviated to the right. No acute fracture. Concha bullosa left middle turbinate. Mastoid sinus clear bilaterally. Prior thyroidectomy on the left. No adenopathy. No significant soft tissue swelling of the face. Bilateral lens replacement. Limited intracranial imaging reveals no acute abnormality. Generalized atrophy. IMPRESSION: Mucosal edema throughout the paranasal sinuses bilaterally. There appears to be obstruction of the ostium of the right maxillary sinus due to mucosal edema. No acute bony abnormality.  No soft tissue swelling. Electronically Signed   By: Franchot Gallo M.D.   On: 10/05/2015 13:14    Microbiology: Recent Results (from the past 240 hour(s))  Blood Culture (routine x 2)     Status: Abnormal   Collection Time: 10/01/15  8:33 AM  Result Value Ref Range Status   Specimen Description BLOOD LEFT ANTECUBITAL  Final   Special Requests BOTTLES DRAWN AEROBIC AND ANAEROBIC 5ML  Final   Culture  Setup Time   Final    GRAM POSITIVE COCCI IN CHAINS IN BOTH AEROBIC AND ANAEROBIC BOTTLES CRITICAL RESULT CALLED TO,  READ BACK BY AND VERIFIED WITH: CLARK,RN @2343  10/01/15 MKELLY    Culture (A)  Final    VIRIDANS STREPTOCOCCUS THE SIGNIFICANCE OF ISOLATING THIS ORGANISM FROM A SINGLE SET OF BLOOD CULTURES WHEN MULTIPLE SETS ARE DRAWN IS UNCERTAIN. PLEASE NOTIFY THE MICROBIOLOGY DEPARTMENT WITHIN ONE WEEK IF SPECIATION AND SENSITIVITIES ARE REQUIRED. ROTHIA SPECIES Standardized susceptibility testing for this organism is not available. Performed at [facility]    Report Status 10/06/2015 FINAL  Final  Urine culture     Status: None   Collection Time: 10/02/15  3:29 AM  Result Value Ref Range Status   Specimen Description URINE, CLEAN CATCH  Final   Special Requests NONE  Final   Culture MULTIPLE SPECIES PRESENT, SUGGEST RECOLLECTION  Final   Report Status 10/03/2015 FINAL  Final  Respiratory virus panel     Status: Abnormal   Collection Time: 10/02/15  1:14 PM  Result Value Ref Range Status   Source - RVPAN NOSE  Corrected   Respiratory Syncytial Virus A Negative Negative Final   Respiratory Syncytial Virus B Negative Negative Final   Influenza A  Negative Negative Final   Influenza B Negative Negative Final   Parainfluenza 1 Negative Negative Final   Parainfluenza 2 Negative Negative Final   Parainfluenza 3 Negative Negative Final   Metapneumovirus Negative Negative Final   Rhinovirus Positive (A) Negative Final   Adenovirus Negative Negative Final    Comment: (NOTE) Performed At: [facility] [address] [place], [place] [id number] Lindon Romp MD [id number]   Culture, sputum-assessment     Status: None   Collection Time: 10/02/15  1:35 PM  Result Value Ref Range Status   Specimen Description SPUTUM  Final   Special Requests NONE  Final   Sputum evaluation   Final    THIS SPECIMEN IS ACCEPTABLE. RESPIRATORY CULTURE REPORT TO FOLLOW.   Report Status 10/03/2015 FINAL  Final  Culture, respiratory (NON-Expectorated)     Status: None   Collection Time: 10/02/15   1:35 PM  Result Value Ref Range Status   Specimen Description SPUTUM  Final   Special Requests NONE  Final   Gram Stain   Final    ABUNDANT WBC PRESENT, PREDOMINANTLY PMN RARE SQUAMOUS EPITHELIAL CELLS PRESENT NO ORGANISMS SEEN THIS SPECIMEN IS ACCEPTABLE FOR SPUTUM CULTURE Performed at [facility]    Culture   Final    NORMAL OROPHARYNGEAL FLORA Performed at [facility]    Report Status 10/05/2015 FINAL  Final     Labs: Basic Metabolic Panel:  Recent Labs Lab 10/01/15 0925 10/02/15 0413 10/02/15 1428 10/03/15 0405 10/06/15 0343  NA 129*  --  131* 133* 135  K 3.1*  --  4.0 3.8 4.0  CL 92*  --  96* 101 99*  CO2 26  --  26 24 27   GLUCOSE 175*  --  160* 160* 155*  BUN 25*  --  13 10 14   CREATININE 0.84 0.64 0.64 0.58 0.71  CALCIUM 8.4*  --  8.5* 8.2* 8.9   Liver Function Tests:  Recent Labs Lab 10/01/15 0925  AST 18  ALT 17  ALKPHOS 57  BILITOT 0.6  PROT 6.9  ALBUMIN 3.8   No results for input(s): LIPASE, AMYLASE in the last 168 hours. No results for input(s): AMMONIA in the last 168 hours. CBC:  Recent Labs Lab 10/01/15 0837 10/03/15 0405 10/06/15 0343  WBC 18.1* 9.8 8.5  NEUTROABS 14.6*  --   --   HGB 13.4 10.3* 10.1*  HCT 39.7 30.2* 29.8*  MCV 83.8 85.8 83.2  PLT 242 192 217   Cardiac Enzymes: No results for input(s): CKTOTAL, CKMB, CKMBINDEX, TROPONINI in the last 168 hours. BNP: BNP (last 3 results) No results for input(s): BNP in the last 8760 hours.  ProBNP (last 3 results)  Recent Labs  01/18/15 1542 01/29/15 1553 02/02/15 1156  PROBNP 75.0 40.0 150.0*    CBG:  Recent Labs Lab 10/05/15 0748 10/05/15 1140 10/05/15 1816 10/05/15 2156 10/06/15 0742  GLUCAP 109* 151* 180* 140* 130*       Signed:  Maleka Contino A MD.  [facility] 10/06/2015, 12:09 PM

## 2015-10-07 ENCOUNTER — Ambulatory Visit: Payer: Medicare Other | Admitting: Pulmonary Disease

## 2015-10-11 ENCOUNTER — Ambulatory Visit (INDEPENDENT_AMBULATORY_CARE_PROVIDER_SITE_OTHER): Payer: Medicare Other | Admitting: Gastroenterology

## 2015-10-11 ENCOUNTER — Encounter: Payer: Self-pay | Admitting: Gastroenterology

## 2015-10-11 VITALS — BP 112/60 | HR 58 | Wt 140.0 lb

## 2015-10-11 DIAGNOSIS — I252 Old myocardial infarction: Secondary | ICD-10-CM | POA: Diagnosis not present

## 2015-10-11 DIAGNOSIS — H548 Legal blindness, as defined in USA: Secondary | ICD-10-CM | POA: Diagnosis not present

## 2015-10-11 DIAGNOSIS — R194 Change in bowel habit: Secondary | ICD-10-CM

## 2015-10-11 DIAGNOSIS — J440 Chronic obstructive pulmonary disease with acute lower respiratory infection: Secondary | ICD-10-CM | POA: Diagnosis not present

## 2015-10-11 DIAGNOSIS — K225 Diverticulum of esophagus, acquired: Secondary | ICD-10-CM | POA: Diagnosis not present

## 2015-10-11 DIAGNOSIS — M797 Fibromyalgia: Secondary | ICD-10-CM | POA: Diagnosis not present

## 2015-10-11 DIAGNOSIS — I2510 Atherosclerotic heart disease of native coronary artery without angina pectoris: Secondary | ICD-10-CM | POA: Diagnosis not present

## 2015-10-11 DIAGNOSIS — Z8673 Personal history of transient ischemic attack (TIA), and cerebral infarction without residual deficits: Secondary | ICD-10-CM | POA: Diagnosis not present

## 2015-10-11 DIAGNOSIS — Z794 Long term (current) use of insulin: Secondary | ICD-10-CM | POA: Diagnosis not present

## 2015-10-11 DIAGNOSIS — J189 Pneumonia, unspecified organism: Secondary | ICD-10-CM | POA: Diagnosis not present

## 2015-10-11 DIAGNOSIS — I10 Essential (primary) hypertension: Secondary | ICD-10-CM | POA: Diagnosis not present

## 2015-10-11 DIAGNOSIS — E119 Type 2 diabetes mellitus without complications: Secondary | ICD-10-CM | POA: Diagnosis not present

## 2015-10-11 NOTE — Patient Instructions (Signed)
Please start OTC benefiber pills (one pill or wafer), once daily. Continue your daily stool softner.

## 2015-10-11 NOTE — Progress Notes (Signed)
Review of pertinent gastrointestinal problems: 1. Benign Sigmoid stricture: she underwent colonoscopy with Dr. Erskine Emery June 2011. This exam revealed significant stenosis in stricturing in the sigmoid Colon Which he presumed was from chronic diverticular disease , adhesions. Indeed she underwent sigmoid resection 2013 with Dr. Remo Lipps gross , primary anastomosis for chronic stricturing, chronic diverticulitis.  2017 flex sig with Dr. Ardis Hughs for intermittent loose stools that seem to be possibly related to overflow type situation, she was found to have benign appearing narrowing at the colocolonic anastomosis, this was dilated with a inflated balloon to 20 mm, held for 1 minute.  HPI: This is a   very pleasant 82 year old woman whom I last saw at the time of flexible sigmoidoscopy about 3 months ago  Chief complaint is somewhat improved constipation  She feels she is doing a little bit better.  She has BMs about every 3-4 days, sometimes with pushing and straining.  She tried Metamucil, Citrucel powder supplement but stopped it after just a few days because she hated how it tasted   Past Medical History  Diagnosis Date  . Coronary artery disease   . 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 Hypertension   . Dyslipidemia   . Diabetes mellitus   . CVA (cerebral infarction) 1991  . Macular degeneration   . GERD (gastroesophageal reflux disease)   . Diverticulosis   . IBS (irritable bowel syndrome)   . Zenker's diverticulum   . Emphysema   . Pulmonary hypertension ([place])   . Chronic sinusitis   . Fibromyalgia   . Hypothyroidism   . Sjogren's disease ([place])   . Raynaud's phenomenon   . Cervical disc disease   . Adenomatous colon polyp   . Scleroderma ([place])   . COPD (chronic obstructive pulmonary disease) ([place])   . Arthritis   . Blood transfusion   . Myocardial infarction [facility]) may 27th 2007  . Depression   . Stroke [facility]) 1990    brain stem stroke - weakness rt hand  . Cancer [facility])    cervical cancer pre hysterectomy  . Sleep apnea     STOP BANG SCORE 5  . Legally blind   . CONSTIPATION, CHRONIC 06/26/2007    Qualifier: Diagnosis of  By: Nils Pyle CMA ([place]), Mearl Latin    . Pneumonia 07/16/2013     HX PNEUMONIA  . Pneumonia   . Bronchitis     Past Surgical History  Procedure Laterality Date  . Total abdominal hysterectomy  1973  . Bilateral oophorectomy  2002  . Spinal fusion  1997    and implantation of a plate   . Lobe thyroid removal    . Thyroid lobectomy  1991    removal of left lobe thyroid  . Cholecystectomy  1965  . Appendectomy    . Cataract extraction    . Bronchoscopy  02-22-09  . Stented cardiac  artery  2007 / 2007 / 2010    X 3 STENT  . Proctoscopy  03/18/2012    Procedure: PROCTOSCOPY;  Surgeon: Adin Hector, MD;  Location: [facility];  Service: General;  Laterality: N/A;  rigid procto  . Colon surgery  2014    colon resection  . Esophagogastroduodenoscopy (egd) with esophageal dilation N/A 06/18/2013    Procedure: ESOPHAGOGASTRODUODENOSCOPY (EGD) WITH ESOPHAGEAL DILATION;  Surgeon: Inda Castle, MD;  Location: [facility];  Service: Endoscopy;  Laterality: N/A;  . Cardiac catheterization      x 3  . Colonectomy    . Radiology with  anesthesia N/A 03/09/2015    Procedure: MRI CERVICAL SPINE WITHOUT AND LUMBER WITHOUT;  Surgeon: Medication Radiologist, MD;  Location: [facility];  Service: Radiology;  Laterality: N/A;  . Flexible sigmoidoscopy N/A 07/15/2015    Procedure: FLEXIBLE SIGMOIDOSCOPY;  Surgeon: Milus Banister, MD;  Location: [facility];  Service: Endoscopy;  Laterality: N/A;  . Balloon dilation N/A 07/15/2015    Procedure: BALLOON DILATION;  Surgeon: Milus Banister, MD;  Location: [facility];  Service: Endoscopy;  Laterality: N/A;    Current Outpatient Prescriptions  Medication Sig Dispense Refill  . amoxicillin-clavulanate (AUGMENTIN) 875-125 MG tablet Take 1 tablet by mouth 2 (two) times daily. 14 tablet 0  . aspirin EC 81 MG tablet  Take 1 tablet (81 mg total) by mouth daily. 90 tablet 3  . cetirizine (ZYRTEC) 10 MG tablet Take 10 mg by mouth daily after breakfast.     . esomeprazole (NEXIUM) 40 MG capsule Take 40 mg by mouth daily at 12 noon.    . fluticasone (FLONASE) 50 MCG/ACT nasal spray Place 1 spray into the nose daily as needed for allergies.     [name] guaiFENesin (MUCINEX) 600 MG 12 hr tablet Take 600 mg by mouth 2 (two) times daily as needed for to loosen phlegm.    [name] HYDROcodone-acetaminophen (NORCO) 7.5-325 MG tablet Take 0.5 tablets by mouth every 6 (six) hours as needed for moderate pain.    [name] imipramine (TOFRANIL) 25 MG tablet Take 75 mg by mouth at bedtime.     . insulin lispro (HUMALOG KWIKPEN) 100 UNIT/ML KiwkPen Inject 4-6 Units into the skin 3 (three) times daily with meals. Inject 6 units subcutaneously with breakfast and lunch, inject 4 units with supper    . levothyroxine (SYNTHROID, LEVOTHROID) 100 MCG tablet Take 100 mcg by mouth daily before breakfast.    . montelukast (SINGULAIR) 10 MG tablet Take 10 mg by mouth daily with breakfast.    . predniSONE (DELTASONE) 5 MG tablet Take 1 tablet (5 mg total) by mouth daily with breakfast. 100 tablet 1  . saccharomyces boulardii (FLORASTOR) 250 MG capsule Take 1 capsule (250 mg total) by mouth 2 (two) times daily. (Patient taking differently: Take 250 mg by mouth daily. ) 60 capsule 3  . triamterene-hydrochlorothiazide (MAXZIDE-25) 37.5-25 MG tablet Take 0.5 tablets by mouth daily.     . triazolam (HALCION) 0.25 MG tablet Take 0.25 mg by mouth at bedtime as needed for sleep. For sleep     No current facility-administered medications for this visit.    Allergies as of 10/11/2015 - Review Complete 10/11/2015  Allergen Reaction Noted  . Lantus [insulin glargine]  03/14/2012  . Levaquin [levofloxacin in d5w] Other (See Comments) 11/02/2014  . Maxidex [dexamethasone] Other (See Comments) 01/29/2015  . Metoprolol-hydrochlorothiazide Other (See Comments)   .  Norvasc [amlodipine] Other (See Comments) 01/29/2015  . Novolog [insulin aspart]  03/18/2012  . Sulfonamide derivatives Hives   . Titanium Other (See Comments) 03/27/2011  . Adhesive [tape] Hives and Rash 03/13/2012    Family History  Problem Relation Age of Onset  . Other Mother 64    Died from sepsis  . Stroke Father 15    died  . Heart disease Father   . Diabetes Father   . Cancer Sister     lung  . Diabetes Sister   . Cancer Brother     lung  . Diabetes Brother   . Diabetes Brother     Social History   Social History  .  Marital Status: Divorced    Spouse Name: N/A  . Number of Children: N/A  . Years of Education: N/A   Occupational History  . retired    Social History Main Topics  . Smoking status: Former Smoker -- 1.00 packs/day for 40 years    Types: Cigarettes    Quit date: 06/26/1988  . Smokeless tobacco: Never Used     Comment: 40 pack years  . Alcohol Use: No  . Drug Use: No  . Sexual Activity: Not on file   Other Topics Concern  . Not on file   Social History Narrative     Physical Exam: BP 112/60 mmHg  Pulse 58  Wt 140 lb (63.504 kg) Constitutional: Elderly, blind Psychiatric: alert and oriented x3 Abdomen: soft, nontender, nondistended, no obvious ascites, no peritoneal signs, normal bowel sounds   Assessment and plan: 82 y.o. female with Benign mild anastomotic sigmoid stricture  It doesn't seem like her bowel habits are too cumbersome for her. I did recommend she retry fiber supplements as that may help a bit further but this time with wafer or pill fiber supplement rather than the powder which she did not like the taste of. She will continue a stool softener once daily. She knows to call she has any further questions or concerns.   Owens Loffler, MD [facility] 10/11/2015, 3:30 PM

## 2015-10-13 DIAGNOSIS — I2510 Atherosclerotic heart disease of native coronary artery without angina pectoris: Secondary | ICD-10-CM | POA: Diagnosis not present

## 2015-10-13 DIAGNOSIS — J189 Pneumonia, unspecified organism: Secondary | ICD-10-CM | POA: Diagnosis not present

## 2015-10-13 DIAGNOSIS — I10 Essential (primary) hypertension: Secondary | ICD-10-CM | POA: Diagnosis not present

## 2015-10-13 DIAGNOSIS — K225 Diverticulum of esophagus, acquired: Secondary | ICD-10-CM | POA: Diagnosis not present

## 2015-10-13 DIAGNOSIS — J440 Chronic obstructive pulmonary disease with acute lower respiratory infection: Secondary | ICD-10-CM | POA: Diagnosis not present

## 2015-10-13 DIAGNOSIS — E119 Type 2 diabetes mellitus without complications: Secondary | ICD-10-CM | POA: Diagnosis not present

## 2015-10-20 DIAGNOSIS — E109 Type 1 diabetes mellitus without complications: Secondary | ICD-10-CM | POA: Diagnosis not present

## 2015-10-20 DIAGNOSIS — Z0000 Encounter for general adult medical examination without abnormal findings: Secondary | ICD-10-CM | POA: Diagnosis not present

## 2015-10-20 DIAGNOSIS — K225 Diverticulum of esophagus, acquired: Secondary | ICD-10-CM | POA: Diagnosis not present

## 2015-10-20 DIAGNOSIS — E039 Hypothyroidism, unspecified: Secondary | ICD-10-CM | POA: Diagnosis not present

## 2015-10-20 DIAGNOSIS — J440 Chronic obstructive pulmonary disease with acute lower respiratory infection: Secondary | ICD-10-CM | POA: Diagnosis not present

## 2015-10-20 DIAGNOSIS — I10 Essential (primary) hypertension: Secondary | ICD-10-CM | POA: Diagnosis not present

## 2015-10-20 DIAGNOSIS — E1065 Type 1 diabetes mellitus with hyperglycemia: Secondary | ICD-10-CM | POA: Diagnosis not present

## 2015-10-20 DIAGNOSIS — J189 Pneumonia, unspecified organism: Secondary | ICD-10-CM | POA: Diagnosis not present

## 2015-10-20 DIAGNOSIS — I2510 Atherosclerotic heart disease of native coronary artery without angina pectoris: Secondary | ICD-10-CM | POA: Diagnosis not present

## 2015-10-20 DIAGNOSIS — E119 Type 2 diabetes mellitus without complications: Secondary | ICD-10-CM | POA: Diagnosis not present

## 2015-10-20 DIAGNOSIS — E782 Mixed hyperlipidemia: Secondary | ICD-10-CM | POA: Diagnosis not present

## 2015-10-22 DIAGNOSIS — K225 Diverticulum of esophagus, acquired: Secondary | ICD-10-CM | POA: Diagnosis not present

## 2015-10-22 DIAGNOSIS — J189 Pneumonia, unspecified organism: Secondary | ICD-10-CM | POA: Diagnosis not present

## 2015-10-22 DIAGNOSIS — I2510 Atherosclerotic heart disease of native coronary artery without angina pectoris: Secondary | ICD-10-CM | POA: Diagnosis not present

## 2015-10-22 DIAGNOSIS — E119 Type 2 diabetes mellitus without complications: Secondary | ICD-10-CM | POA: Diagnosis not present

## 2015-10-22 DIAGNOSIS — J440 Chronic obstructive pulmonary disease with acute lower respiratory infection: Secondary | ICD-10-CM | POA: Diagnosis not present

## 2015-10-22 DIAGNOSIS — I10 Essential (primary) hypertension: Secondary | ICD-10-CM | POA: Diagnosis not present

## 2015-10-25 DIAGNOSIS — I10 Essential (primary) hypertension: Secondary | ICD-10-CM | POA: Diagnosis not present

## 2015-10-25 DIAGNOSIS — E119 Type 2 diabetes mellitus without complications: Secondary | ICD-10-CM | POA: Diagnosis not present

## 2015-10-25 DIAGNOSIS — J189 Pneumonia, unspecified organism: Secondary | ICD-10-CM | POA: Diagnosis not present

## 2015-10-25 DIAGNOSIS — K225 Diverticulum of esophagus, acquired: Secondary | ICD-10-CM | POA: Diagnosis not present

## 2015-10-25 DIAGNOSIS — I2510 Atherosclerotic heart disease of native coronary artery without angina pectoris: Secondary | ICD-10-CM | POA: Diagnosis not present

## 2015-10-25 DIAGNOSIS — J440 Chronic obstructive pulmonary disease with acute lower respiratory infection: Secondary | ICD-10-CM | POA: Diagnosis not present

## 2015-10-26 DIAGNOSIS — J189 Pneumonia, unspecified organism: Secondary | ICD-10-CM | POA: Diagnosis not present

## 2015-10-26 DIAGNOSIS — J440 Chronic obstructive pulmonary disease with acute lower respiratory infection: Secondary | ICD-10-CM | POA: Diagnosis not present

## 2015-10-26 DIAGNOSIS — I10 Essential (primary) hypertension: Secondary | ICD-10-CM | POA: Diagnosis not present

## 2015-10-26 DIAGNOSIS — E119 Type 2 diabetes mellitus without complications: Secondary | ICD-10-CM | POA: Diagnosis not present

## 2015-10-28 DIAGNOSIS — N390 Urinary tract infection, site not specified: Secondary | ICD-10-CM | POA: Diagnosis not present

## 2015-10-28 DIAGNOSIS — E039 Hypothyroidism, unspecified: Secondary | ICD-10-CM | POA: Diagnosis not present

## 2015-10-28 DIAGNOSIS — E1065 Type 1 diabetes mellitus with hyperglycemia: Secondary | ICD-10-CM | POA: Diagnosis not present

## 2015-10-28 DIAGNOSIS — I2510 Atherosclerotic heart disease of native coronary artery without angina pectoris: Secondary | ICD-10-CM | POA: Diagnosis not present

## 2015-10-28 DIAGNOSIS — E782 Mixed hyperlipidemia: Secondary | ICD-10-CM | POA: Diagnosis not present

## 2015-10-29 DIAGNOSIS — K225 Diverticulum of esophagus, acquired: Secondary | ICD-10-CM | POA: Diagnosis not present

## 2015-10-29 DIAGNOSIS — I10 Essential (primary) hypertension: Secondary | ICD-10-CM | POA: Diagnosis not present

## 2015-10-29 DIAGNOSIS — I2510 Atherosclerotic heart disease of native coronary artery without angina pectoris: Secondary | ICD-10-CM | POA: Diagnosis not present

## 2015-10-29 DIAGNOSIS — J440 Chronic obstructive pulmonary disease with acute lower respiratory infection: Secondary | ICD-10-CM | POA: Diagnosis not present

## 2015-10-29 DIAGNOSIS — J189 Pneumonia, unspecified organism: Secondary | ICD-10-CM | POA: Diagnosis not present

## 2015-10-29 DIAGNOSIS — E119 Type 2 diabetes mellitus without complications: Secondary | ICD-10-CM | POA: Diagnosis not present

## 2015-11-04 ENCOUNTER — Other Ambulatory Visit: Payer: Self-pay | Admitting: Pulmonary Disease

## 2015-11-08 ENCOUNTER — Telehealth: Payer: Self-pay | Admitting: Pulmonary Disease

## 2015-11-08 NOTE — Telephone Encounter (Signed)
Pt takes Prednisone 5mg  daily states that her breathing has been doing well with the Singulair.  Pt wanting to know if she can come off the Prednisone or if this is something that she needs to stay on long term.  If coming off, will she need to taper off in any way - pt is concerned with stopping suddenly. Please advise Dr Halford Chessman. Thanks.

## 2015-11-08 NOTE — Telephone Encounter (Signed)
Pt aware of rec's per VS. Nothing further needed. 

## 2015-11-08 NOTE — Telephone Encounter (Signed)
She can try changing prednisone to 5 mg every other day for 2 weeks.  If breathing okay, then she can try stopping prednisone.

## 2015-12-08 DIAGNOSIS — I10 Essential (primary) hypertension: Secondary | ICD-10-CM | POA: Diagnosis not present

## 2015-12-08 DIAGNOSIS — E119 Type 2 diabetes mellitus without complications: Secondary | ICD-10-CM | POA: Diagnosis not present

## 2015-12-08 DIAGNOSIS — N390 Urinary tract infection, site not specified: Secondary | ICD-10-CM | POA: Diagnosis not present

## 2015-12-08 DIAGNOSIS — E039 Hypothyroidism, unspecified: Secondary | ICD-10-CM | POA: Diagnosis not present

## 2015-12-09 DIAGNOSIS — E1165 Type 2 diabetes mellitus with hyperglycemia: Secondary | ICD-10-CM | POA: Diagnosis not present

## 2015-12-22 DIAGNOSIS — E1165 Type 2 diabetes mellitus with hyperglycemia: Secondary | ICD-10-CM | POA: Diagnosis not present

## 2015-12-22 DIAGNOSIS — E039 Hypothyroidism, unspecified: Secondary | ICD-10-CM | POA: Diagnosis not present

## 2015-12-22 DIAGNOSIS — I10 Essential (primary) hypertension: Secondary | ICD-10-CM | POA: Diagnosis not present

## 2015-12-27 ENCOUNTER — Telehealth: Payer: Self-pay | Admitting: Pulmonary Disease

## 2015-12-27 DIAGNOSIS — M2550 Pain in unspecified joint: Secondary | ICD-10-CM | POA: Diagnosis not present

## 2015-12-27 DIAGNOSIS — M340 Progressive systemic sclerosis: Secondary | ICD-10-CM | POA: Diagnosis not present

## 2015-12-27 DIAGNOSIS — I7300 Raynaud's syndrome without gangrene: Secondary | ICD-10-CM | POA: Diagnosis not present

## 2015-12-27 DIAGNOSIS — M5030 Other cervical disc degeneration, unspecified cervical region: Secondary | ICD-10-CM | POA: Diagnosis not present

## 2015-12-27 DIAGNOSIS — M5136 Other intervertebral disc degeneration, lumbar region: Secondary | ICD-10-CM | POA: Diagnosis not present

## 2015-12-27 DIAGNOSIS — J189 Pneumonia, unspecified organism: Secondary | ICD-10-CM | POA: Diagnosis not present

## 2015-12-27 NOTE — Telephone Encounter (Signed)
Patient notified of Dr. Wert's recommendations. Nothing further needed.  

## 2015-12-27 NOTE — Telephone Encounter (Signed)
Spoke with pt, states that since coming off of maintenance prednisone "every bone in her body hurts".  Per 5/15 OV note, pt was to cut down to 5mg  qod X2 weeks then discontinue prednisone.  Pt has been off of prednisone X1 month and these s/s have worsened continually and gradually.    Pt states she is having difficulty ambulating, and is having a hard time getting comfortable at rest.  Pt requesting recs.   Pt saw her orthopedic doc today for her joint pain, but suggested she contact our office as VS started her on maintenance prednisone.    Sending to DOD as VS is on vacation.  MW please advise.  Thanks!      Patient Instructions       Augmentin 1 pill twice per day for 1 week.  If not better after first week, then take a second week of augmentin.  Follow up in 2 months

## 2015-12-27 NOTE — Telephone Encounter (Signed)
Sorry to hear this - it's a common scenario with pred taper but not life threatening >> not a pulmonary problem but does need further eval by PC and rec in meantime try tylenol arthritis and norco as last resort.

## 2016-01-11 ENCOUNTER — Encounter: Payer: Self-pay | Admitting: Pulmonary Disease

## 2016-01-11 ENCOUNTER — Ambulatory Visit (INDEPENDENT_AMBULATORY_CARE_PROVIDER_SITE_OTHER): Payer: Medicare Other | Admitting: Pulmonary Disease

## 2016-01-11 VITALS — BP 118/76 | HR 74 | Ht 62.0 in | Wt 134.6 lb

## 2016-01-11 DIAGNOSIS — J438 Other emphysema: Secondary | ICD-10-CM

## 2016-01-11 DIAGNOSIS — J329 Chronic sinusitis, unspecified: Secondary | ICD-10-CM

## 2016-01-11 DIAGNOSIS — J8489 Other specified interstitial pulmonary diseases: Secondary | ICD-10-CM

## 2016-01-11 NOTE — Patient Instructions (Signed)
Follow up in 6 months 

## 2016-01-11 NOTE — Progress Notes (Signed)
Current Outpatient Prescriptions on File Prior to Visit  Medication Sig  . aspirin EC 81 MG tablet Take 1 tablet (81 mg total) by mouth daily.  . cetirizine (ZYRTEC) 10 MG tablet Take 10 mg by mouth daily after breakfast.   . esomeprazole (NEXIUM) 40 MG capsule Take 40 mg by mouth daily at 12 noon.  . fluticasone (FLONASE) 50 MCG/ACT nasal spray Place 1 spray into the nose daily as needed for allergies.   [name] guaiFENesin (MUCINEX) 600 MG 12 hr tablet Take 600 mg by mouth daily as needed for to loosen phlegm.   [name] HYDROcodone-acetaminophen (NORCO) 7.5-325 MG tablet Take 0.5 tablets by mouth every 6 (six) hours as needed for moderate pain.  [name] imipramine (TOFRANIL) 25 MG tablet Take 75 mg by mouth at bedtime.   . insulin lispro (HUMALOG KWIKPEN) 100 UNIT/ML KiwkPen Inject 4-6 Units into the skin 3 (three) times daily with meals. Inject 6 units subcutaneously with breakfast and lunch, inject 5 units with supper  . levothyroxine (SYNTHROID, LEVOTHROID) 100 MCG tablet Take 100 mcg by mouth daily before breakfast.  . montelukast (SINGULAIR) 10 MG tablet Take 10 mg by mouth daily with breakfast.  . predniSONE (DELTASONE) 5 MG tablet TAKE 1 TABLET IN THE MORNING WITH BREAKFAST.  [name] saccharomyces boulardii (FLORASTOR) 250 MG capsule Take 1 capsule (250 mg total) by mouth 2 (two) times daily. (Patient taking differently: Take 250 mg by mouth daily. )  . triazolam (HALCION) 0.25 MG tablet Take 0.25 mg by mouth at bedtime as needed for sleep. For sleep  . triamterene-hydrochlorothiazide (MAXZIDE-25) 37.5-25 MG tablet Take 0.5 tablets by mouth daily. Reported on 01/11/2016   No current facility-administered medications on file prior to visit.     Chief Complaint  Patient presents with  . Follow-up    Pt currently taking 5mg  Pred daily - unable to taper off. Pt states that her breathing has been doing well with the singulair - no new complaints.      Pulmonary tests August 2010 >> Bronchoscopy PFT 06/16/09  >> FEV1 1.99(118%), FEV1% 73, TLC 4.60(105%), DLCO 66%, no BD CT chest 11/24/11 >> centrilobular emphysema, RUL ASD CT sinus 07/30/13 >> opacification of Lt maxillary sinus, rightward NSD CT sinus 10/05/15 >> b/l mucosal edema paranasal sinuses, obstruction of ostium Rt maxillary sinus  Past medical hx CAD, HTN, HLD, DM, CVA, Macular degeneration, GERD, Dysphagia, IBS, Depression, Fibromyalgia, Hypothyroidism, Sjogren syndrome, Raynaud's phenomenon, Scleroderma  Past surgical hx, Allergies, Family hx, Social hx all reviewed.  Vital Signs BP 118/76 mmHg  Pulse 74  Ht 5\' 2"  (1.575 m)  Wt 134 lb 9.6 oz (61.054 kg)  BMI 24.61 kg/m2  SpO2 97%  History of Present Illness [name] is a 82 y.o. 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  Her sinuses are doing much better. She is on combination of flonase, singulair, zyrtec, mucinex.  She tried coming off prednisone.  She had terrible body aches and cough.  She was seen by rheumatology PA and told she might also have rheumatoid arthritis.  She was given cortisone shot >> helped partially.  She then resumed 5 mg prednisone daily and has been feeling much better.  She still gets temperature elevation in afternoon up to 69F.  She is not having cough, wheeze, sputum, chest pain.   Physical Exam  General - No distress ENT - no sinus tenderness, no LAN, spot on tongue >> no  change Cardiac - s1s2 regular, no murmur Chest - No wheeze/rales/dullness Back - No focal tenderness Abd - Soft, non-tender Ext - No edema Neuro - Normal strength Skin - No rashes Psych - normal mood, and behavior   Assessment/Plan  Recurrent sinus infections. - much improved on current regimen of flonase, singulair, zyrtec, mucinex - She has been seen previously by Dr. 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 - she does not tolerate coming off  prednisone >> continue 5 mg prednisone daily  Hx of tobacco abuse with emphysema. - she has not noticed benefit from previous trials of inhaler therapy - monitor clinically  Hx of connective tissue disease. - f/u with rheumatology   Patient Instructions  Follow up in 6 months     Chesley Mires, MD [place] [facility] Pager:  [phone] 01/11/2016, 11:13 AM

## 2016-02-21 ENCOUNTER — Other Ambulatory Visit: Payer: Self-pay

## 2016-03-02 ENCOUNTER — Ambulatory Visit
Admission: RE | Admit: 2016-03-02 | Discharge: 2016-03-02 | Disposition: A | Discharge status: Home / Self Care | Payer: Medicare Other | Source: Ambulatory Visit | Attending: Sports Medicine | Admitting: Sports Medicine

## 2016-03-02 ENCOUNTER — Ambulatory Visit (INDEPENDENT_AMBULATORY_CARE_PROVIDER_SITE_OTHER): Payer: Medicare Other | Admitting: Sports Medicine

## 2016-03-02 ENCOUNTER — Encounter: Payer: Self-pay | Admitting: Sports Medicine

## 2016-03-02 VITALS — BP 125/52 | Ht 62.0 in | Wt 133.0 lb

## 2016-03-02 DIAGNOSIS — M25572 Pain in left ankle and joints of left foot: Secondary | ICD-10-CM | POA: Diagnosis not present

## 2016-03-02 DIAGNOSIS — M25579 Pain in unspecified ankle and joints of unspecified foot: Secondary | ICD-10-CM | POA: Diagnosis not present

## 2016-03-02 DIAGNOSIS — M25571 Pain in right ankle and joints of right foot: Secondary | ICD-10-CM | POA: Diagnosis not present

## 2016-03-02 MED ORDER — TRAMADOL HCL 50 MG PO TABS: 50.0000 mg | ORAL_TABLET | Freq: Two times a day (BID) | ORAL | 2 refills | Status: DC

## 2016-03-02 NOTE — Assessment & Plan Note (Signed)
Patient has significant bilateral ankle pain This limits her ability to walk or stand X-rays however do not show significant bony changes or arthritis Ultrasound does show some slight decrease thickness in the cartilage between the top of the talus and the tibia  All the current evaluation has been somewhat unrevealing I think her pain pattern is real I do not see a reactive pattern with ankle effusions We will use tramadol twice daily to try to pain If she simply could not get rid of pain we could consider MRI I would not be suprised she has some bone edema and osteopenia from her long-term prednisone use  Recheck pending response to using a regular dose of tramadol for the next few weeks

## 2016-03-02 NOTE — Progress Notes (Signed)
Chief complaint: Bilateral ankle pain  Patient is an older lady who has tried to do some exercise A couple months ago she tried doing walking and some exercise in a swimming pool She developed significant swelling of her ankles up her lower leg More on the left side She saw a rheumatologist who gave her a corticosteroid shot This did resolve the swelling  However, since that time she really has trouble standing or walking because of bilateral ankle pain The ankles do not seem to swell a lot The type of shoes seems to make very little difference  Past medical history This will document in the chart Of significant interest for this visit is that she has been on long-term prednisone because of her chronic obstructive pulmonary disease She was a long-term smoker but now has quit She has been on numerous antibiotics for recurrent pneumonias She does have diabetes  Medication list is reviewed For her more severe pain she uses hydrocodone  Review of systems Denies swelling in other joints She did get ankle swelling with Norvasc No swelling of the hand joints Changes in her skin secondary to scleroderma   Examination Pleasant older lady in no acute distress BP (!) 125/52   Ht 5\' 2"  (1.575 m)   Wt 133 lb (60.3 kg)   BMI 24.33 kg/m   Left Ankle: No visible erythema or swelling. Range of motion is full in all directions. Strength is 5/5 in all directions. Stable lateral and medial ligaments; squeeze test and kleiger test unremarkable; Talar dome nontender; No pain at base of 5th MT; No tenderness over cuboid; No tenderness over N spot or navicular prominence No tenderness on posterior aspects of lateral and medial malleolus No sign of peroneal tendon subluxations; Negative tarsal tunnel tinel's Able to walk 4 steps.  RT ankle Ankle: No visible erythema or swelling. Range of motion is full in all directions. Strength is 5/5 in all directions. Stable lateral and medial  ligaments; squeeze test and kleiger test unremarkable; Talar dome nontender; No pain at base of 5th MT; No tenderness over cuboid; No tenderness over N spot or navicular prominence No tenderness on posterior aspects of lateral and medial malleolus No sign of peroneal tendon subluxations; Negative tarsal tunnel tinel's Able to walk 4 steps.  What is notable is that on standing: She has preservation of a moderately high arch bilaterally She has pain along the tibiotalar articulation  Walking gait is antalgic and she is cautious with stepping  Ultrasound No effusion is seen over the lateral or medial aspects of the ankle joint on the right Right talar dome appears normal The cartilage space between the talar dome in the tibia is somewhat decreased  Left ankle shows no swelling over the joint anteriorly medially or laterally Left talar dome appears normal Cartilage space may be somewhat reduced across talar dome  X-rays reviewed Right ankle shows normal bony architecture except for a posterior calcaneal spur Left ankle shows normal bony architecture except for a posterior calcaneal spur

## 2016-03-14 DIAGNOSIS — H353290 Exudative age-related macular degeneration, unspecified eye, stage unspecified: Secondary | ICD-10-CM | POA: Diagnosis not present

## 2016-03-23 NOTE — Progress Notes (Signed)
Cardiology Office Note   Date:  03/24/2016   ID:  [name], DOB 1930/07/04, MRN [id number]  PCP:  Merrilee Seashore, MD  Cardiologist:   Dorris Carnes, MD   F/U of CAD      History of Present Illness: [name] is a 82 y.o. female with a history of  fBOOP and CAD (s/p IWMI with stent ot RCA. repeat stnet to the RCA for instent restenois. She is also s/p DES to L main. Last cath In summer 2011 showed: 40% LAD lesion; 50% Lcx; small OM1 with 80% lesion; RCA with 20% instent restenosis This was stable. Pt continued on medical  I saw the pt in  Feb 2017   Hospitallized with sepsis in [place]  Doing better    Dens ieCP  Breathing is fair  Needs to f/u with V Sood    Does note some sinus drainage over the past few wks  Concerned about sinus infection which she has had before     Outpatient Medications Prior to Visit  Medication Sig Dispense Refill  . aspirin EC 81 MG tablet Take 1 tablet (81 mg total) by mouth daily. 90 tablet 3  . cetirizine (ZYRTEC) 10 MG tablet Take 10 mg by mouth daily after breakfast.     . esomeprazole (NEXIUM) 40 MG capsule Take 40 mg by mouth daily at 12 noon.    . fluticasone (FLONASE) 50 MCG/ACT nasal spray Place 1 spray into the nose daily as needed for allergies.     [name] guaiFENesin (MUCINEX) 600 MG 12 hr tablet Take 600 mg by mouth daily as needed for to loosen phlegm.     [name] HYDROcodone-acetaminophen (NORCO) 7.5-325 MG tablet Take 0.5 tablets by mouth every 6 (six) hours as needed for moderate pain.    [name] imipramine (TOFRANIL) 25 MG tablet Take 75 mg by mouth at bedtime.     . insulin lispro (HUMALOG KWIKPEN) 100 UNIT/ML KiwkPen Inject 4-6 Units into the skin 3 (three) times daily with meals. Inject 6 units subcutaneously with breakfast and lunch, inject 5 units with supper    . levothyroxine (SYNTHROID, LEVOTHROID) 100 MCG tablet Take 100 mcg by mouth daily before breakfast.    . montelukast (SINGULAIR) 10 MG tablet Take 10 mg by mouth  daily with breakfast.    . traMADol (ULTRAM) 50 MG tablet Take 1 tablet (50 mg total) by mouth 2 (two) times daily. 60 tablet 2  . triazolam (HALCION) 0.25 MG tablet Take 0.25 mg by mouth at bedtime as needed for sleep. For sleep    . predniSONE (DELTASONE) 5 MG tablet TAKE 1 TABLET IN THE MORNING WITH BREAKFAST. 90 tablet 2   No facility-administered medications prior to visit.      Allergies:   Lantus [insulin glargine]; Levaquin [levofloxacin in d5w]; Maxidex [dexamethasone]; Metoprolol-hydrochlorothiazide; Norvasc [amlodipine]; Novolog [insulin aspart]; Penciclovir; Sulfonamide derivatives; Titanium; and Adhesive [tape]   Past Medical History:  Diagnosis Date  . Adenomatous colon polyp   . Arthritis   . Blood transfusion   . Bronchitis   . Cancer [facility])    cervical cancer pre hysterectomy  . Cervical disc disease   . Chronic sinusitis   . CONSTIPATION, CHRONIC 06/26/2007   Qualifier: Diagnosis of  By: Nils Pyle CMA ([place]), Mearl Latin    . COPD (chronic obstructive pulmonary disease) ([place])   . Coronary artery disease   . CVA (cerebral infarction) 1991  . Depression   . Diabetes mellitus   . Diverticulosis   .  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[place])   . Dyslipidemia   . Emphysema   . Fibromyalgia   . GERD (gastroesophageal reflux disease)   . Hypertension   . Hypothyroidism   . IBS (irritable bowel syndrome)   . Legally blind   . Macular degeneration   . Myocardial infarction [facility]) may 27th 2007  . Pneumonia 07/16/2013    HX PNEUMONIA  . Pneumonia   . Pulmonary hypertension ([place])   . Raynaud's phenomenon   . Scleroderma ([place])   . Sjogren's disease ([place])   . Sleep apnea    STOP BANG SCORE 5  . Stroke [facility]) 1990   brain stem stroke - weakness rt hand  . Zenker's diverticulum     Past Surgical History:  Procedure Laterality Date  . APPENDECTOMY    . BALLOON DILATION N/A 07/15/2015   Procedure: BALLOON DILATION;  Surgeon: Milus Banister, MD;  Location: [facility];  Service:  Endoscopy;  Laterality: N/A;  . BILATERAL OOPHORECTOMY  2002  . BRONCHOSCOPY  02-22-09  . CARDIAC CATHETERIZATION     x 3  . CATARACT EXTRACTION    . CHOLECYSTECTOMY  1965  . COLON SURGERY  2014   colon resection  . colonectomy    . ESOPHAGOGASTRODUODENOSCOPY (EGD) WITH ESOPHAGEAL DILATION N/A 06/18/2013   Procedure: ESOPHAGOGASTRODUODENOSCOPY (EGD) WITH ESOPHAGEAL DILATION;  Surgeon: Inda Castle, MD;  Location: [facility];  Service: Endoscopy;  Laterality: N/A;  . FLEXIBLE SIGMOIDOSCOPY N/A 07/15/2015   Procedure: FLEXIBLE SIGMOIDOSCOPY;  Surgeon: Milus Banister, MD;  Location: [facility];  Service: Endoscopy;  Laterality: N/A;  . lobe thyroid removal    . PROCTOSCOPY  03/18/2012   Procedure: PROCTOSCOPY;  Surgeon: Adin Hector, MD;  Location: [facility];  Service: General;  Laterality: N/A;  rigid procto  . RADIOLOGY WITH ANESTHESIA N/A 03/09/2015   Procedure: MRI CERVICAL SPINE WITHOUT AND LUMBER WITHOUT;  Surgeon: Medication Radiologist, MD;  Location: [facility];  Service: Radiology;  Laterality: N/A;  . [place]   and implantation of a plate   . STENTED CARDIAC  ARTERY  2007 / 2007 / 2010   X 3 STENT  . THYROID LOBECTOMY  1991   removal of left lobe thyroid  . TOTAL ABDOMINAL HYSTERECTOMY  1973     Social History:  The patient  reports that she quit smoking about 27 years ago. Her smoking use included Cigarettes. She has a 40.00 pack-year smoking history. She has never used smokeless tobacco. She reports that she does not drink alcohol or use drugs.   Family History:  The patient's family history includes Cancer in her brother and sister; Diabetes in her brother, brother, father, and sister; Heart disease in her father; Other (age of onset: 88) in her mother; Stroke (age of onset: 98) in her father.    ROS:  Please see the history of present illness. All other systems are reviewed and  Negative to the above problem except as noted.    PHYSICAL EXAM: VS:  BP (!)  138/54   Pulse 89   Ht 5\' 2"  (1.575 m)   Wt 136 lb 12.8 oz (62.1 kg)   SpO2 95%   BMI 25.02 kg/m   GEN: Well nourished, well developed, in no acute distress HEENT: normal Neck: no JVD, carotid bruits, or masses Cardiac: RRR; no murmurs, rubs, or gallops,no edema  Respiratory:  clear to auscultation bilaterally, normal work of breathing GI: soft, nontender, nondistended, + BS  No hepatomegaly  MS: no  deformity Moving all extremities   Skin: warm and dry, no rash Neuro:  Strength and sensation are intact Psych: euthymic mood, full affect   EKG:  EKG is not  ordered today.   Lipid Panel    Component Value Date/Time   CHOL 178 03/04/2015 0750   TRIG 86.0 03/04/2015 0750   HDL 48.30 03/04/2015 0750   CHOLHDL 4 03/04/2015 0750   VLDL 17.2 03/04/2015 0750   LDLCALC 113 (H) 03/04/2015 0750   LDLDIRECT 127.6 05/15/2013 1422      Wt Readings from Last 3 Encounters:  03/24/16 136 lb 12.8 oz (62.1 kg)  03/02/16 133 lb (60.3 kg)  01/11/16 134 lb 9.6 oz (61.1 kg)      ASSESSMENT AND PLAN:  1  CAD  No symptoms of angina  2  Hx HTN  BP is OK off of meds  3  Pulmonary  Complains of sinus drainage  Concerned  Has had bad sinus infections in past  Will Rx amoxicllin Makes appt with V Sood     Current medicines are reviewed at length with the patient today.  The patient does not have concerns regarding medicines.  Disposition:   FU with  in   [place]   Signed, Dorris Carnes, MD  03/24/2016 9:59 AM    [facility] [place], [place]  [address] Phone: [phone]; Fax: [phone]

## 2016-03-24 ENCOUNTER — Encounter: Payer: Self-pay | Admitting: Internal Medicine

## 2016-03-24 ENCOUNTER — Ambulatory Visit (INDEPENDENT_AMBULATORY_CARE_PROVIDER_SITE_OTHER): Payer: Medicare Other | Admitting: Internal Medicine

## 2016-03-24 VITALS — BP 138/54 | HR 89 | Ht 62.0 in | Wt 136.8 lb

## 2016-03-24 DIAGNOSIS — Z23 Encounter for immunization: Secondary | ICD-10-CM | POA: Diagnosis not present

## 2016-03-24 DIAGNOSIS — E785 Hyperlipidemia, unspecified: Secondary | ICD-10-CM

## 2016-03-24 LAB — LIPID PANEL
Cholesterol: 193 mg/dL (ref 125–200)
HDL: 45 mg/dL — ABNORMAL LOW (ref >=46)
LDL Cholesterol: 119 mg/dL (ref <130)
Total CHOL/HDL Ratio: 4.3 Ratio (ref <=5.0)
Triglycerides: 146 mg/dL (ref <150)
VLDL: 29 mg/dL (ref <30)

## 2016-03-24 MED ORDER — AMOXICILLIN 500 MG PO CAPS: 500.0000 mg | ORAL_CAPSULE | Freq: Two times a day (BID) | ORAL | 0 refills | Status: DC

## 2016-03-24 NOTE — Patient Instructions (Signed)
Your physician recommends that you continue on your current medications as directed. Please refer to the Current Medication list given to you today.  Pick up your prescription for amoxicillin 500 mg take 1 tablet two times a day for 7 days.  Your physician recommends that you return for lab work today ([place]) Your physician wants you to follow-up in: 6 months with Dr. Harrington Challenger.  You will receive a reminder letter in the mail two months in advance. If you don't receive a letter, please call our office to schedule the follow-up appointment.

## 2016-03-30 ENCOUNTER — Other Ambulatory Visit: Payer: Self-pay | Admitting: Student

## 2016-03-30 ENCOUNTER — Ambulatory Visit (INDEPENDENT_AMBULATORY_CARE_PROVIDER_SITE_OTHER): Payer: Medicare Other | Admitting: Sports Medicine

## 2016-03-30 ENCOUNTER — Encounter: Payer: Self-pay | Admitting: Sports Medicine

## 2016-03-30 VITALS — BP 145/60 | HR 83 | Ht 62.0 in | Wt 136.0 lb

## 2016-03-30 DIAGNOSIS — M25562 Pain in left knee: Secondary | ICD-10-CM | POA: Insufficient documentation

## 2016-03-30 DIAGNOSIS — E871 Hypo-osmolality and hyponatremia: Secondary | ICD-10-CM

## 2016-03-30 DIAGNOSIS — M25561 Pain in right knee: Secondary | ICD-10-CM

## 2016-03-30 LAB — BASIC METABOLIC PANEL
BUN: 23 mg/dL (ref 7–25)
CO2: 22 mmol/L (ref 20–31)
Calcium: 9.1 mg/dL (ref 8.6–10.4)
Chloride: 98 mmol/L (ref 98–110)
Creat: 0.8 mg/dL (ref 0.60–0.88)
Glucose, Bld: 123 mg/dL — ABNORMAL HIGH (ref 65–99)
Potassium: 3.9 mmol/L (ref 3.5–5.3)
Sodium: 137 mmol/L (ref 135–146)

## 2016-03-30 LAB — CBC
HCT: 37.5 % (ref 35.0–45.0)
Hemoglobin: 12.3 g/dL (ref 11.7–15.5)
MCH: 26.2 pg — ABNORMAL LOW (ref 27.0–33.0)
MCHC: 32.8 g/dL (ref 32.0–36.0)
MCV: 79.8 fL — ABNORMAL LOW (ref 80.0–100.0)
MPV: 9.7 fL (ref 7.5–12.5)
Platelets: 254 10*3/uL (ref 140–400)
RBC: 4.7 MIL/uL (ref 3.80–5.10)
RDW: 14.8 % (ref 11.0–15.0)
WBC: 8.2 10*3/uL (ref 3.8–10.8)

## 2016-03-30 MED ORDER — HYDROCODONE-ACETAMINOPHEN 5-300 MG PO TABS: ORAL_TABLET | ORAL | 0 refills | Status: DC

## 2016-03-30 NOTE — Progress Notes (Deleted)
  [name] - 82 y.o. female MRN [id number]  Date of birth: [birth year]  SUBJECTIVE:  Including CC & ROS.  CC: ***   Location:  Duration:  Quality:  Severity:  Timing:  Context:  Modifying factors:  Associated signs and symptoms:   ROS: No unexpected weight loss, fever, chills, swelling, instability, muscle pain, numbness/tingling, redness, otherwise see HPI   PMHx - Updated and reviewed.  Contributory factors include: Negative PSHx - Updated and reviewed.  Contributory factors include:  Negative FHx - Updated and reviewed.  Contributory factors include:  Negative Social Hx - Updated and reviewed. Contributory factors include: Negative Medications - reviewed   DATA REVIEWED: ***  PHYSICAL EXAM:  VS: BP:(!) 145/60  HR:83bpm  TEMP: ( )  RESP:   HT:5\' 2"  (157.5 cm)   WT:136 lb (61.7 kg)  BMI:24.9 PHYSICAL EXAM: Gen: NAD, alert, cooperative with exam, well-appearing HEENT: clear conjunctiva,  CV:  no edema, capillary refill brisk, normal rate Resp: non-labored Skin: no rashes, normal turgor  Neuro: no gross deficits.  Psych:  alert and oriented ***  ASSESSMENT & PLAN:   No problem-specific Assessment & Plan notes found for this encounter.

## 2016-03-30 NOTE — Progress Notes (Signed)
  [name] - 82 y.o. female MRN [id number]  Date of birth: [birth year]  SUBJECTIVE:  Including CC & ROS.  CC: multiple joint pains Presents in follow-up for her ankle pain, however she also has other complaints of bilateral greater trochanter bursitis and right knee pain. She had stopped taking prednisone due to the fact that it had caused osteopenia.  Since she stopped taking the prednisone she complains of bilateral lateral hip pain that her previous physician has told her greater trochanter bursitis. And she is also complaining of right knee pain that will buckle at times. She denies any swelling of the knee but she has a lot of difficulty with ambulation.  On her last exam, she did have a lot of ankle pain but not a lot of arthritis on x-ray and not a lot of changes on ultrasound. She does complain of general aches and pains throughout her body.  ROS: No unexpected weight loss, fever, chills, swelling, instability, muscle pain, numbness/tingling, redness, otherwise see HPI   PMHx - Updated and reviewed.  Contributory factors include: BOOP, fibromyalgia, anemia chronic disease, hypothyroidism, diabetes mellitus  PSHx - Updated and reviewed.  Contributory factors include: noncontributory FHx - Updated and reviewed.  Contributory factors include: questionable autoimmune disease runs in family Social Hx - Updated and reviewed. Contributory factors include: Negative Medications - reviewed   DATA REVIEWED: Previous office visits  PHYSICAL EXAM:  VS: BP:(!) 145/60  HR:83bpm  TEMP: ( )  RESP:   HT:'5\' 2"'$  (157.5 cm)   WT:136 lb (61.7 kg)  BMI:24.9 PHYSICAL EXAM: Gen: NAD, alert, cooperative with exam, well-appearing HEENT: clear conjunctiva,  CV:  no edema, capillary refill brisk, normal rate Resp: non-labored Skin: no rashes, normal turgor  Neuro: no gross deficits.  Psych:  alert and oriented  HIP: Tenderness to palpation over greater trochanter bilaterally. Log roll  negative. Corky Sox and FADIR with pain on lateral hip  Knee: Normal to inspection with no erythema or effusion or obvious bony abnormalities. Palpation normal with no warmth, joint line tenderness, patellar tenderness, or condyle tenderness. ROM full in flexion and extension and lower leg rotation. Non painful patellar compression. Patellar glide without crepitus. Patellar and quadriceps tendons unremarkable. Hamstring and quadriceps strength is normal.     ASSESSMENT & PLAN:   Arthralgia of both lower legs Has systemic arthralgias and aches, worsened after stopping prednisone.  Will check ESR, CBC, BMP to assess chronic diseases, including polymyalgia rheumatica.  Continue 1/2 tablet vicodin.  Consider restarting prednisone if still having issues.

## 2016-03-30 NOTE — Assessment & Plan Note (Addendum)
Has systemic arthralgias and aches, worsened after stopping prednisone.  Will check ESR, CBC, BMP to assess chronic diseases, including polymyalgia rheumatica.  Continue 1/2 tablet vicodin.  Consider restarting prednisone if still having issues.

## 2016-03-31 LAB — SEDIMENTATION RATE: Sed Rate: 4 mm/hr (ref 0–30)

## 2016-04-11 DIAGNOSIS — L218 Other seborrheic dermatitis: Secondary | ICD-10-CM | POA: Diagnosis not present

## 2016-04-11 DIAGNOSIS — L821 Other seborrheic keratosis: Secondary | ICD-10-CM | POA: Diagnosis not present

## 2016-04-11 DIAGNOSIS — Z79899 Other long term (current) drug therapy: Secondary | ICD-10-CM | POA: Diagnosis not present

## 2016-04-11 DIAGNOSIS — L820 Inflamed seborrheic keratosis: Secondary | ICD-10-CM | POA: Diagnosis not present

## 2016-04-11 DIAGNOSIS — L650 Telogen effluvium: Secondary | ICD-10-CM | POA: Diagnosis not present

## 2016-04-13 DIAGNOSIS — I2510 Atherosclerotic heart disease of native coronary artery without angina pectoris: Secondary | ICD-10-CM | POA: Diagnosis not present

## 2016-04-13 DIAGNOSIS — E782 Mixed hyperlipidemia: Secondary | ICD-10-CM | POA: Diagnosis not present

## 2016-04-26 DIAGNOSIS — E109 Type 1 diabetes mellitus without complications: Secondary | ICD-10-CM | POA: Diagnosis not present

## 2016-04-26 DIAGNOSIS — I2510 Atherosclerotic heart disease of native coronary artery without angina pectoris: Secondary | ICD-10-CM | POA: Diagnosis not present

## 2016-04-26 DIAGNOSIS — E782 Mixed hyperlipidemia: Secondary | ICD-10-CM | POA: Diagnosis not present

## 2016-04-26 DIAGNOSIS — E039 Hypothyroidism, unspecified: Secondary | ICD-10-CM | POA: Diagnosis not present

## 2016-05-08 DIAGNOSIS — I10 Essential (primary) hypertension: Secondary | ICD-10-CM | POA: Diagnosis not present

## 2016-05-08 DIAGNOSIS — E1165 Type 2 diabetes mellitus with hyperglycemia: Secondary | ICD-10-CM | POA: Diagnosis not present

## 2016-05-08 DIAGNOSIS — E039 Hypothyroidism, unspecified: Secondary | ICD-10-CM | POA: Diagnosis not present

## 2016-06-07 DIAGNOSIS — I10 Essential (primary) hypertension: Secondary | ICD-10-CM | POA: Diagnosis not present

## 2016-06-07 DIAGNOSIS — E1165 Type 2 diabetes mellitus with hyperglycemia: Secondary | ICD-10-CM | POA: Diagnosis not present

## 2016-06-07 DIAGNOSIS — E039 Hypothyroidism, unspecified: Secondary | ICD-10-CM | POA: Diagnosis not present

## 2016-06-14 ENCOUNTER — Emergency Department (HOSPITAL_COMMUNITY): Payer: Medicare Other

## 2016-06-14 ENCOUNTER — Encounter (HOSPITAL_COMMUNITY): Payer: Self-pay | Admitting: Emergency Medicine

## 2016-06-14 ENCOUNTER — Emergency Department (HOSPITAL_COMMUNITY)
Admission: EM | Admit: 2016-06-14 | Discharge: 2016-06-14 | Disposition: A | Discharge status: Home / Self Care | Payer: Medicare Other | Attending: Emergency Medicine | Admitting: Emergency Medicine

## 2016-06-14 DIAGNOSIS — Y939 Activity, unspecified: Secondary | ICD-10-CM | POA: Diagnosis not present

## 2016-06-14 DIAGNOSIS — J449 Chronic obstructive pulmonary disease, unspecified: Secondary | ICD-10-CM | POA: Insufficient documentation

## 2016-06-14 DIAGNOSIS — R05 Cough: Secondary | ICD-10-CM | POA: Diagnosis not present

## 2016-06-14 DIAGNOSIS — T148XXA Other injury of unspecified body region, initial encounter: Secondary | ICD-10-CM | POA: Diagnosis not present

## 2016-06-14 DIAGNOSIS — E039 Hypothyroidism, unspecified: Secondary | ICD-10-CM | POA: Diagnosis not present

## 2016-06-14 DIAGNOSIS — R531 Weakness: Secondary | ICD-10-CM | POA: Diagnosis not present

## 2016-06-14 DIAGNOSIS — W19XXXA Unspecified fall, initial encounter: Secondary | ICD-10-CM

## 2016-06-14 DIAGNOSIS — I10 Essential (primary) hypertension: Secondary | ICD-10-CM | POA: Diagnosis not present

## 2016-06-14 DIAGNOSIS — I252 Old myocardial infarction: Secondary | ICD-10-CM | POA: Insufficient documentation

## 2016-06-14 DIAGNOSIS — W010XXA Fall on same level from slipping, tripping and stumbling without subsequent striking against object, initial encounter: Secondary | ICD-10-CM | POA: Diagnosis not present

## 2016-06-14 DIAGNOSIS — Y929 Unspecified place or not applicable: Secondary | ICD-10-CM | POA: Insufficient documentation

## 2016-06-14 DIAGNOSIS — J181 Lobar pneumonia, unspecified organism: Secondary | ICD-10-CM | POA: Insufficient documentation

## 2016-06-14 DIAGNOSIS — I2510 Atherosclerotic heart disease of native coronary artery without angina pectoris: Secondary | ICD-10-CM | POA: Diagnosis not present

## 2016-06-14 DIAGNOSIS — J189 Pneumonia, unspecified organism: Secondary | ICD-10-CM | POA: Diagnosis not present

## 2016-06-14 DIAGNOSIS — E119 Type 2 diabetes mellitus without complications: Secondary | ICD-10-CM | POA: Insufficient documentation

## 2016-06-14 DIAGNOSIS — Z87891 Personal history of nicotine dependence: Secondary | ICD-10-CM | POA: Insufficient documentation

## 2016-06-14 DIAGNOSIS — S92325A Nondisplaced fracture of second metatarsal bone, left foot, initial encounter for closed fracture: Secondary | ICD-10-CM | POA: Insufficient documentation

## 2016-06-14 DIAGNOSIS — S92335A Nondisplaced fracture of third metatarsal bone, left foot, initial encounter for closed fracture: Secondary | ICD-10-CM | POA: Insufficient documentation

## 2016-06-14 DIAGNOSIS — Y999 Unspecified external cause status: Secondary | ICD-10-CM | POA: Diagnosis not present

## 2016-06-14 DIAGNOSIS — Z8673 Personal history of transient ischemic attack (TIA), and cerebral infarction without residual deficits: Secondary | ICD-10-CM | POA: Diagnosis not present

## 2016-06-14 DIAGNOSIS — R918 Other nonspecific abnormal finding of lung field: Secondary | ICD-10-CM | POA: Diagnosis not present

## 2016-06-14 DIAGNOSIS — S99922A Unspecified injury of left foot, initial encounter: Secondary | ICD-10-CM | POA: Diagnosis present

## 2016-06-14 DIAGNOSIS — Z794 Long term (current) use of insulin: Secondary | ICD-10-CM | POA: Insufficient documentation

## 2016-06-14 DIAGNOSIS — Z8541 Personal history of malignant neoplasm of cervix uteri: Secondary | ICD-10-CM | POA: Diagnosis not present

## 2016-06-14 DIAGNOSIS — S92909A Unspecified fracture of unspecified foot, initial encounter for closed fracture: Secondary | ICD-10-CM | POA: Diagnosis not present

## 2016-06-14 DIAGNOSIS — Z7982 Long term (current) use of aspirin: Secondary | ICD-10-CM | POA: Diagnosis not present

## 2016-06-14 DIAGNOSIS — M25579 Pain in unspecified ankle and joints of unspecified foot: Secondary | ICD-10-CM | POA: Diagnosis not present

## 2016-06-14 LAB — CBC WITH DIFFERENTIAL/PLATELET
Basophils Absolute: 0 10*3/uL (ref 0.0–0.1)
Basophils Relative: 0 %
Eosinophils Absolute: 0.2 10*3/uL (ref 0.0–0.7)
Eosinophils Relative: 2 %
HCT: 33.3 % — ABNORMAL LOW (ref 36.0–46.0)
Hemoglobin: 11.4 g/dL — ABNORMAL LOW (ref 12.0–15.0)
Lymphocytes Relative: 17 %
Lymphs Abs: 2 10*3/uL (ref 0.7–4.0)
MCH: 26.3 pg (ref 26.0–34.0)
MCHC: 34.2 g/dL (ref 30.0–36.0)
MCV: 76.9 fL — ABNORMAL LOW (ref 78.0–100.0)
Monocytes Absolute: 0.9 10*3/uL (ref 0.1–1.0)
Monocytes Relative: 8 %
Neutro Abs: 8.3 10*3/uL — ABNORMAL HIGH (ref 1.7–7.7)
Neutrophils Relative %: 73 %
Platelets: 272 10*3/uL (ref 150–400)
RBC: 4.33 MIL/uL (ref 3.87–5.11)
RDW: 12.9 % (ref 11.5–15.5)
WBC: 11.4 10*3/uL — ABNORMAL HIGH (ref 4.0–10.5)

## 2016-06-14 LAB — BASIC METABOLIC PANEL
Anion gap: 8 (ref 5–15)
BUN: 22 mg/dL — ABNORMAL HIGH (ref 6–20)
CO2: 27 mmol/L (ref 22–32)
Calcium: 8.8 mg/dL — ABNORMAL LOW (ref 8.9–10.3)
Chloride: 93 mmol/L — ABNORMAL LOW (ref 101–111)
Creatinine, Ser: 0.64 mg/dL (ref 0.44–1.00)
GFR calc Af Amer: >60 mL/min (ref >60)
GFR calc non Af Amer: >60 mL/min (ref >60)
Glucose, Bld: 186 mg/dL — ABNORMAL HIGH (ref 65–99)
Potassium: 4.3 mmol/L (ref 3.5–5.1)
Sodium: 128 mmol/L — ABNORMAL LOW (ref 135–145)

## 2016-06-14 LAB — I-STAT TROPONIN, ED: Troponin i, poc: 0 ng/mL (ref 0.00–0.08)

## 2016-06-14 MED ORDER — AMOXICILLIN-POT CLAVULANATE 875-125 MG PO TABS
1.0000 | ORAL_TABLET | Freq: Once | ORAL | Status: AC
  Administered 2016-06-14: 1 via ORAL
  Filled 2016-06-14: qty 1

## 2016-06-14 MED ORDER — HYDROCODONE-ACETAMINOPHEN 5-325 MG PO TABS: 1.0000 | ORAL_TABLET | Freq: Four times a day (QID) | ORAL | 0 refills | Status: DC | PRN

## 2016-06-14 MED ORDER — AMOXICILLIN-POT CLAVULANATE 875-125 MG PO TABS: 1.0000 | ORAL_TABLET | Freq: Two times a day (BID) | ORAL | 0 refills | Status: DC

## 2016-06-14 MED ORDER — IOPAMIDOL (ISOVUE-300) INJECTION 61%
75.0000 mL | Freq: Once | INTRAVENOUS | Status: AC | PRN
  Administered 2016-06-14: 75 mL via INTRAVENOUS

## 2016-06-14 NOTE — Progress Notes (Addendum)
Wheelchair from [facility] at bedside.  Informed EDRN that patient's daughter requesting to speak or doctor regarding patient's condition.

## 2016-06-14 NOTE — ED Provider Notes (Signed)
[place] DEPT Provider Note   CSN: [id number] Arrival date & time: 06/14/16  1005     History   Chief Complaint Chief Complaint  Patient presents with  . Fall    HPI [name] is a 82 y.o. female.  Patient presents to the ED with a chief complaint of fall yesterday.  She states that she tripped on the curb after getting a piece of paper off the lawn.  She states that she has had some increased SOB and weakness over the past week or so.  She has a history of BOOP, but denies any fever or cough now.  She has not tried taking any medications.  She states that she has not been able to walk on the injured foot.   The history is provided by the patient. No language interpreter was used.    Past Medical History:  Diagnosis Date  . Adenomatous colon polyp   . Arthritis   . Blood transfusion   . Bronchitis   . Cancer [facility])    cervical cancer pre hysterectomy  . Cervical disc disease   . Chronic sinusitis   . CONSTIPATION, CHRONIC 06/26/2007   Qualifier: Diagnosis of  By: Nils Pyle CMA ([place]), Mearl Latin    . COPD (chronic obstructive pulmonary disease) ([place])   . Coronary artery disease   . CVA (cerebral infarction) 1991  . Depression   . Diabetes mellitus   . Diverticulosis   . 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 Dyslipidemia   . Emphysema   . Fibromyalgia   . GERD (gastroesophageal reflux disease)   . Hypertension   . Hypothyroidism   . IBS (irritable bowel syndrome)   . Legally blind   . Macular degeneration   . Myocardial infarction may 27th 2007  . Pneumonia 07/16/2013    HX PNEUMONIA  . Pneumonia   . Pulmonary hypertension   . Raynaud's phenomenon   . Scleroderma ([place])   . Sjogren's disease ([place])   . Sleep apnea    STOP BANG SCORE 5  . Stroke [facility]) 1990   brain stem stroke - weakness rt hand  . Zenker's diverticulum     Patient Active Problem List   Diagnosis Date Noted  . Arthralgia of both lower legs 03/30/2016  . Hypothyroidism 10/01/2015  . HAP  (hospital-acquired pneumonia) 05/16/2015  . Sepsis ([place]) 05/16/2015  . HCAP (healthcare-associated pneumonia) 05/16/2015  . Diarrhea 05/02/2015  . Diarrhea with dehydration 05/02/2015  . Hyponatremia 05/02/2015  . Weakness 01/29/2015  . Pain in joint, ankle and foot 11/20/2014  . Acute bronchitis 09/01/2014  . CAP (community acquired pneumonia) 05/26/2014  . Dysphagia, unspecified(787.20) 06/18/2013  . Stricture and stenosis of esophagus 06/18/2013  . Urinary incontinence in female 05/06/2012  . Fibromyalgia   . Chronic confusional state 03/21/2012  . Postoperative anemia 03/21/2012  . Anemia of chronic disease 03/19/2012  . Diabetes mellitus ([place]) 05/30/2011  . Colonic stricture s/p lap sigmoid colectomy 03/18/2012 03/27/2011  . Diverticulosis of colon 03/27/2011  . Systemic sclerosis ([place]) 06/29/2009  . Pure hypercholesterolemia 05/25/2009  . BOOP (bronchiolitis obliterans with organizing pneumonia) ([place]) 02/18/2009  . Chronic rhinitis 07/08/2007  . EMPHYSEMA 07/08/2007  . Hyperlipidemia 06/26/2007  . Depression 06/26/2007  . Legal blindness 06/26/2007  . Essential hypertension 06/26/2007  . CORONARY ARTERY DISEASE 06/26/2007  . ZENKER'S DIVERTICULUM 06/26/2007  . Irritable bowel syndrome 06/26/2007    Past Surgical History:  Procedure Laterality Date  . APPENDECTOMY    . BALLOON DILATION N/A 07/15/2015  Procedure: BALLOON DILATION;  Surgeon: Milus Banister, MD;  Location: [facility];  Service: Endoscopy;  Laterality: N/A;  . BILATERAL OOPHORECTOMY  2002  . BRONCHOSCOPY  02-22-09  . CARDIAC CATHETERIZATION     x 3  . CATARACT EXTRACTION    . CHOLECYSTECTOMY  1965  . COLON SURGERY  2014   colon resection  . colonectomy    . ESOPHAGOGASTRODUODENOSCOPY (EGD) WITH ESOPHAGEAL DILATION N/A 06/18/2013   Procedure: ESOPHAGOGASTRODUODENOSCOPY (EGD) WITH ESOPHAGEAL DILATION;  Surgeon: Inda Castle, MD;  Location: [place];  Service: Endoscopy;  Laterality: N/A;  .  FLEXIBLE SIGMOIDOSCOPY N/A 07/15/2015   Procedure: FLEXIBLE SIGMOIDOSCOPY;  Surgeon: Milus Banister, MD;  Location: [facility];  Service: Endoscopy;  Laterality: N/A;  . lobe thyroid removal    . PROCTOSCOPY  03/18/2012   Procedure: PROCTOSCOPY;  Surgeon: Adin Hector, MD;  Location: [facility];  Service: General;  Laterality: N/A;  rigid procto  . RADIOLOGY WITH ANESTHESIA N/A 03/09/2015   Procedure: MRI CERVICAL SPINE WITHOUT AND LUMBER WITHOUT;  Surgeon: Medication Radiologist, MD;  Location: [place];  Service: Radiology;  Laterality: N/A;  . [place]   and implantation of a plate   . STENTED CARDIAC  ARTERY  2007 / 2007 / 2010   X 3 STENT  . THYROID LOBECTOMY  1991   removal of left lobe thyroid  . TOTAL ABDOMINAL HYSTERECTOMY  1973    OB History    No data available       Home Medications    Prior to Admission medications   Medication Sig Start Date End Date Taking? Authorizing Provider  amoxicillin (AMOXIL) 500 MG capsule Take 1 capsule (500 mg total) by mouth 2 (two) times daily. 03/24/16  Yes Fay Records, MD  aspirin EC 81 MG tablet Take 1 tablet (81 mg total) by mouth daily. 11/18/13  Yes Fay Records, MD  cetirizine (ZYRTEC) 10 MG tablet Take 10 mg by mouth daily after breakfast.    Yes Historical Provider, MD  esomeprazole (NEXIUM) 40 MG capsule Take 40 mg by mouth daily at 12 noon.   Yes Historical Provider, MD  fluticasone (FLONASE) 50 MCG/ACT nasal spray Place 1 spray into the nose daily as needed for allergies.  06/04/12  Yes Historical Provider, MD  guaiFENesin (MUCINEX) 600 MG 12 hr tablet Take 600 mg by mouth daily as needed for to loosen phlegm.    Yes Historical Provider, MD  HYDROcodone-acetaminophen (NORCO) 7.5-325 MG tablet Take 0.5 tablets by mouth every 6 (six) hours as needed for moderate pain.   Yes Historical Provider, MD  imipramine (TOFRANIL) 25 MG tablet Take 75 mg by mouth at bedtime.  05/26/09  Yes Historical Provider, MD  insulin lispro  (HUMALOG KWIKPEN) 100 UNIT/ML KiwkPen Inject 4-6 Units into the skin 3 (three) times daily with meals. Inject 6 units subcutaneously with breakfast and lunch, inject 5 units with supper   Yes Historical Provider, MD  levothyroxine (SYNTHROID, LEVOTHROID) 100 MCG tablet Take 100 mcg by mouth daily before breakfast.   Yes Historical Provider, MD  montelukast (SINGULAIR) 10 MG tablet Take 10 mg by mouth daily with breakfast. 05/10/15  Yes Historical Provider, MD  triazolam (HALCION) 0.25 MG tablet Take 0.25 mg by mouth at bedtime as needed for sleep. For sleep   Yes Historical Provider, MD  Hydrocodone-Acetaminophen (VICODIN) 5-300 MG TABS Take 1/2 tab every 4-6 hours as needed for pain Patient not taking: Reported on 06/14/2016 03/30/16  Alicia B Chitanand, DO  traMADol (ULTRAM) 50 MG tablet Take 1 tablet (50 mg total) by mouth 2 (two) times daily. Patient not taking: Reported on 06/14/2016 03/02/16   Stefanie Libel, MD    Family History Family History  Problem Relation Age of Onset  . Other Mother 67    Died from sepsis  . Stroke Father 32    died  . Heart disease Father   . Diabetes Father   . Cancer Sister     lung  . Diabetes Sister   . Cancer Brother     lung  . Diabetes Brother   . Diabetes Brother     Social History Social History  Substance Use Topics  . Smoking status: Former Smoker    Packs/day: 1.00    Years: 40.00    Types: Cigarettes    Quit date: 06/26/1988  . Smokeless tobacco: Never Used     Comment: 40 pack years  . Alcohol use No     Allergies   Lantus [insulin glargine]; Levaquin [levofloxacin in d5w]; Maxidex [dexamethasone]; Metoprolol-hydrochlorothiazide; Norvasc [amlodipine]; Novolog [insulin aspart]; Penciclovir; Sulfonamide derivatives; Titanium; and Adhesive [tape]   Review of Systems Review of Systems  Respiratory: Positive for shortness of breath.   Musculoskeletal: Positive for arthralgias.  Neurological: Positive for weakness.  All other systems  reviewed and are negative.    Physical Exam Updated Vital Signs BP 138/57   Pulse 85   Temp 97.4 F (36.3 C)   Resp 16   SpO2 97%   Physical Exam  Constitutional: She is oriented to person, place, and time. She appears well-developed and well-nourished.  HENT:  Head: Normocephalic and atraumatic.  Eyes: Conjunctivae and EOM are normal. Pupils are equal, round, and reactive to light.  Neck: Normal range of motion. Neck supple.  Cardiovascular: Normal rate and regular rhythm.  Exam reveals no gallop and no friction rub.   No murmur heard. Pulmonary/Chest: Effort normal and breath sounds normal. No respiratory distress. She has no wheezes. She has no rales. She exhibits no tenderness.  Abdominal: Soft. Bowel sounds are normal. She exhibits no distension and no mass. There is no tenderness. There is no rebound and no guarding.  Musculoskeletal: Normal range of motion. She exhibits no edema or tenderness.  Left midfoot tender to palpation with mild swelling, no obvious deformity or abnormality  Neurological: She is alert and oriented to person, place, and time.  Skin: Skin is warm and dry.  Psychiatric: She has a normal mood and affect. Her behavior is normal. Judgment and thought content normal.  Nursing note and vitals reviewed.    ED Treatments / Results  Labs (all labs ordered are listed, but only abnormal results are displayed) Labs Reviewed  CBC WITH DIFFERENTIAL/PLATELET - Abnormal; Notable for the following:       Result Value   WBC 11.4 (*)    Hemoglobin 11.4 (*)    HCT 33.3 (*)    MCV 76.9 (*)    Neutro Abs 8.3 (*)    All other components within normal limits  BASIC METABOLIC PANEL - Abnormal; Notable for the following:    Sodium 128 (*)    Chloride 93 (*)    Glucose, Bld 186 (*)    BUN 22 (*)    Calcium 8.8 (*)    All other components within normal limits  I-STAT TROPOININ, ED    EKG  EKG Interpretation None       Radiology Dg Chest 2 View  Result  Date: 06/14/2016 CLINICAL DATA:  Cough and congestion over the last week. Fall yesterday. EXAM: CHEST  2 VIEW COMPARISON:  10/01/2015 FINDINGS: Approximately 5.3 by 4.1 cm region of abnormal density in the right upper lobe, similar region to the very faint opacity noted on the chest radiograph from 10/01/2015. Atherosclerotic aortic arch. Thoracic spondylosis. No pleural effusion. Clips along the left lower neck. IMPRESSION: 1. Abnormal density in the right upper lobe, similar location to the faint density on April 2017. Although this could conceivably be recurrent pneumonia in a similar location, the appearance is now becoming concerning for the possibility of lung cancer. CT chest (with contrast if feasible) is recommended for further characterization. 2. Atherosclerotic aortic arch. 3. Thoracic spondylosis. These results will be called to the ordering clinician or representative by the Radiologist Assistant, and communication documented in the PACS or zVision Dashboard. Electronically Signed   By: Van Clines M.D.   On: 06/14/2016 11:33   Dg Ankle 2 Views Left  Result Date: 06/14/2016 CLINICAL DATA:  Fall yesterday with twisting injury left foot and ankle. EXAM: LEFT ANKLE - 2 VIEW COMPARISON:  03/02/2016 FINDINGS: Achilles calcaneal spur. Vascular calcification noted. No tibiotalar joint effusion. Subtle deformity the base of the second metatarsal compatible fracture. This is close to the Lisfranc ligament in can lead to instability of the Lisfranc joint. IMPRESSION: 1. Fracture the base of the second metatarsal in the vicinity of the Lisfranc ligament. This can lead to instability at the Lisfranc joint. 2. Achilles calcaneal spur. Electronically Signed   By: Van Clines M.D.   On: 06/14/2016 11:38   Ct Chest W Contrast  Result Date: 06/14/2016 CLINICAL DATA:  Abnormal chest radiograph EXAM: CT CHEST WITH CONTRAST TECHNIQUE: Multidetector CT imaging of the chest was performed during  intravenous contrast administration. CONTRAST:  36mL ISOVUE-300 IOPAMIDOL (ISOVUE-300) INJECTION 61% COMPARISON:  Chest radiograph June 14, 2016 FINDINGS: Cardiovascular: There is no thoracic aortic aneurysm or dissection. There is atherosclerotic calcification at multiple sites in the thoracic aorta. There is moderate calcification at the origins of the visualized great vessels. Note that the left and right common carotid arteries arise as a common trunk, an anatomic variant. There are foci of coronary artery calcification present. Pericardium is not appreciably thickened. Mediastinum/Nodes: Patient has had a previous left-sided thyroidectomy. Right lobe of thyroid appears unremarkable. There are multiple small lymph nodes throughout the chest without adenopathy by size criteria. There is a small hiatal hernia. Lungs/Pleura: There is central peribronchial thickening. There is airspace consolidation in portions of the anterior and posterior segments of right upper lobe, more notably in the posterior segment. There is patchy atelectasis in portions of the right middle and lower lobes. There is a 3 mm calcified granuloma in the lateral segment of the right lower lobe. There is no pleural effusion or pleural thickening. Upper Abdomen: In the visualized upper abdomen, there is hepatic steatosis in the liver. There is atherosclerotic calcification in the visualized abdominal aorta. Visualized upper abdominal structures otherwise appear unremarkable. Musculoskeletal: There is degenerative change in the thoracic spine. There are no blastic or lytic bone lesions. IMPRESSION: Airspace consolidation in the right upper lobe consistent with pneumonia. There are foci of pneumonia in the anterior and posterior segments of the right upper lobe, more posteriorly. Areas of patchy atelectasis in the right middle and right lower lobes. No adenopathy. Subcentimeter mediastinal lymph nodes do not meet size criteria for pathologic  significance. Status post left lobe thyroidectomy. Foci of atherosclerotic calcification  as well as foci of coronary artery calcification. No thoracic aortic aneurysm or dissection. No major vessel pulmonary embolus evident. Hepatic steatosis. Small hiatal hernia. Electronically Signed   By: Lowella Grip III M.D.   On: 06/14/2016 13:10   Dg Foot Complete Left  Result Date: 06/14/2016 CLINICAL DATA:  82 year old female with a history of fall with twisting injury and pain EXAM: LEFT FOOT - COMPLETE 3+ VIEW COMPARISON:  None. FINDINGS: Nondisplaced fracture at the base of the second metatarsal and the base of the third metatarsal. No widening at the Lisfranc joint. No radiopaque foreign body. No significant soft tissue swelling. IMPRESSION: Nondisplaced fracture at the base of the second and third left metatarsal. If there is concern for soft tissue disruption at the Lisfranc joint, MRI would be indicated. Signed, Dulcy Fanny. Earleen Newport, DO [facility] Electronically Signed   By: Corrie Mckusick D.O.   On: 06/14/2016 11:25    Procedures Procedures (including critical care time)  Medications Ordered in ED Medications  amoxicillin-clavulanate (AUGMENTIN) 875-125 MG per tablet 1 tablet (not administered)  iopamidol (ISOVUE-300) 61 % injection 75 mL (75 mLs Intravenous Contrast Given 06/14/16 1250)     Initial Impression / Assessment and Plan / ED Course  I have reviewed the triage vital signs and the nursing notes.  Pertinent labs & imaging results that were available during my care of the patient were reviewed by me and considered in my medical decision making (see chart for details).  Clinical Course    Patient with mechanical fall yesterday. Plain films of the left foot are remarkable for a fracture at the base of the second metatarsal. Patient states that she has had some shortness of breath, and has felt more weak than normal. I will  check a chest x-ray given her chronic lung disease history.  Chest x-ray as above, radiology recommends CT scan for further evaluation.  CT scan is remarkable for pneumonia. Patient has follow-up with pulmonology later this week. I will start the patient on Augmentin. I discussed the treatment plan with Dr. Venora Maples, who agrees with the plan.  Give patient a cam walker and attempt to ambulate.  Final Clinical Impressions(s) / ED Diagnoses   Final diagnoses:  Fall, initial encounter  Community acquired pneumonia of right upper lobe of lung ([place])  Closed nondisplaced fracture of second metatarsal bone of left foot, initial encounter    New Prescriptions New Prescriptions   AMOXICILLIN-CLAVULANATE (AUGMENTIN) 875-125 MG TABLET    Take 1 tablet by mouth every 12 (twelve) hours.   HYDROCODONE-ACETAMINOPHEN (NORCO/VICODIN) 5-325 MG TABLET    Take 1 tablet by mouth every 6 (six) hours as needed.     Montine Circle, PA-C 06/14/16 [place], MD 06/14/16 [phone]

## 2016-06-14 NOTE — Progress Notes (Signed)
EDCM spoke to [name] regarding wheelchair.  Per [name], wheelchair will be delivered to patient's room in ED within the hour.

## 2016-06-14 NOTE — ED Notes (Signed)
[facility] here to take patient home 

## 2016-06-14 NOTE — ED Notes (Signed)
Waiting for W/C to be delivered and daughter to arrive

## 2016-06-14 NOTE — Progress Notes (Signed)
Patient suffers from foot fracture which impairs their ability to perform daily activities like bathing, dressing, walking, in the home. A walker will not resolve  issue with performing activities of daily living. A wheelchair will allow patient to safely perform daily activities. Patient is not able to propel themselves in the home using a standard weight wheelchair due to general weakness. Patient can self propel in the lightweight wheelchair.  Accessories: elevating leg rests (ELRs), wheel locks, extensions and anti-tippers.

## 2016-06-14 NOTE — Care Management Note (Addendum)
Case Management Note  Patient Details  Name: ALEIYA KISTNER MRN: [id number] Date of Birth: [birth year]  Subjective/Objective: Patient presents with fall sustaining right foot fracture                   Action/Plan: Discussed home health services with patient at bedside. Per EDPA, patient would like to return home.  Patient is agreeable to home health services.  EDCM provided patient with list of hh agencies in [facility] of which patient chose [facility] as she has had this agency in the past. Patient reports she has a walker at home.  She reports her bathroom is very close to her in the bedroom so she refused bedside commode.  She is agreeable to wheelchair.  Patient lives alone.  Reports has daughter and grandson for support.  She reports she has a neighbor who calls her and checks in on her daily.  Offered list of private duty nursing agencies, however, patient reports she cannot afford.  She reports she has a long term care policy with her insurance.  EDCM encouraged patient to call her insurance  company to activate these services if needed.  Patient has banner for [facility] services.  [facility] consult placed.  Strongly encouraged patient to use her walker at all times. Discussed patient with EDP and EDRN.  No further EDCM needs at this time.  Santiago Glad of [facility] aware of home health referral.   Expected Discharge Date:                  Expected Discharge Plan:  [place]  In-House Referral:     Discharge planning Services  CM Consult  Post Acute Care Choice:  Durable Medical Equipment, Home Health Choice offered to:  Patient  DME Arranged:  Wheelchair manual DME Agency:  [place]:  RN, PT, OT, Nurse's Aide, Social Work [employer] Agency:  [facility]  Status of Service:     If discussed at [employer], dates discussed:    Additional CommentsLivia Snellen, RN 06/14/2016, 4:48 PM

## 2016-06-14 NOTE — ED Triage Notes (Signed)
Per EMS, states she fell yesterday and rolled/twisted left ankle-slight swelling-positive pulses

## 2016-06-14 NOTE — ED Notes (Signed)
Ortho called 

## 2016-06-14 NOTE — ED Notes (Signed)
Case management at bedside.

## 2016-06-14 NOTE — ED Notes (Signed)
Delay on EKG pt is in xray 

## 2016-06-14 NOTE — ED Notes (Signed)
[facility] called for transport back to home 

## 2016-06-14 NOTE — ED Notes (Signed)
Bed: [id number] Expected date:  Expected time:  Means of arrival:  Comments: EMS- 82yo F, fall/ankle pain

## 2016-06-14 NOTE — Progress Notes (Signed)
EDCM asked to speak to patient and daughter at bedside regarding discharge plan.  Patient's daughter concerned about patient living alone and not being able to walk.  EDCM  Reviewed that home health services with RN, PT, OT, aide and socila worker have been arranged.  [facility] also informed patient and her daughter that [facility] consult has been placed.  Patient and patient's daughter report patient has "very good insurance."  They report eveen though she has straight Medicare her stay at a facility will be covered without a three day stay.  EDCM offered patient to go to a facility and speak to Education officer, museum.  Patient reports, "I'll do whatever my daughter wants."  Patient's daughter reports, "It's up to you mom."  EDCM offered to ambulate patient with a walker to see how well she will do prior to going home.  Both patient and daughter agreeable.  Patient and daughter also wanting a 3 in 1 commode now.  Order placed for 3 in 1, placed 3 in 1 at bedside.[name]  EDCM went back to bedside.  Patient reports she has walker, "It was painful but I did it."  [facility] asked patient what her thoughts were in regards to going home vs SNF.  Patient reports she would like to go home.  "I think I will be alright."  Patient and daughter both agreeable to patient going home.  EDCM called patient's RX into [place] for antibiotic.  Patient's daughter reports patient is going home via ambulance.  Patient's daughter will pick up patient's RX at pharmacy, take patient's equipment wheelchair, bsc, to patient's home and wait for patient at her house.  Patient's daughter reports she will assist patient with meals and her laundry.  EDCM updated EDRN.  No further EDCM needs at this time.

## 2016-06-15 ENCOUNTER — Other Ambulatory Visit: Payer: Self-pay

## 2016-06-15 NOTE — Patient Outreach (Signed)
[facility]) Care Management  06/15/2016  [name] 07-02-1930 [id number]   Several telephone calls to patient for ED referral for care coordination. Number busy after several tries.    Plan: RN Health Coach will attempt again within 10 business days.  Jone Baseman, RN, MSN [place] [phone]

## 2016-06-16 ENCOUNTER — Ambulatory Visit: Payer: Medicare Other | Admitting: Adult Health

## 2016-06-20 ENCOUNTER — Other Ambulatory Visit: Payer: Self-pay

## 2016-06-20 ENCOUNTER — Encounter: Payer: Self-pay | Admitting: *Deleted

## 2016-06-20 NOTE — Patient Outreach (Addendum)
[place] [facility]) Care Management  06/20/2016  OZELLE MILK 1931-04-16 [id number]   Referral Date: 06-15-16 Referral Source: ER Case Management Referral Reason: Care Coordination Needs Outreach Attempt: Second Attempt Successful  Social: Patient lives alone but daughter comes to assist.  Patient reports that her daughter still works and can only provide limited assistance.  Patient reports that her daughter has durable POA.  Case Manager in the ER referred patient to [facility] for home health services and [place] [facility]. Patient reports that she was turned down by [facility] due "unsafe conditions".    Patient states she needs some assistance with basic activities of daily living.  Patient shares she has a supplement insurance and a long-term care policy but has not accessed and believes she has some benefits to cover in home care.    Conditions: Patient recently had a fall, which resulted in left foot fracture. Patient reports she has a boot on her left foot and a rollator.  Patient was also diagnosed in the ER with pneumonia. Patient currently on antibiotic.  Patient admits to diabetes mellitus but states that she does not check her sugars regularly due to issues seeing how to get blood on the test strip. Patient reports highest sugar in the 200's but last check was 142.  She attempts to check twice a day but sometimes is not successful due to being legally blind. Patient also admits to COPD. Patient not familiar with action plan.    Medications: Patient able to manage her medications and has no questions at this time.    Services: Patient needs education and management of disease processes.  Patient also needs assistance in obtaining care.  Plan: RN Health Coach will refer to [facility] Nurse for education and management of disease processes and assistance with care coordination.  RN Health Coach will refer to [employer] for assist in  accessing care and care coordination.   Jone Baseman, RN, MSN [place] [phone]

## 2016-06-21 ENCOUNTER — Encounter: Payer: Self-pay | Admitting: *Deleted

## 2016-06-21 ENCOUNTER — Other Ambulatory Visit: Payer: Self-pay | Admitting: *Deleted

## 2016-06-21 DIAGNOSIS — J441 Chronic obstructive pulmonary disease with (acute) exacerbation: Secondary | ICD-10-CM

## 2016-06-21 NOTE — Patient Outreach (Signed)
[place] [facility]) Care Management  06/21/2016  [name] Nov 18, 1930 [id number]  RN spoke with pt today and introduced the [facility] services and available programs. Pt very receptive and discussed her recent experience with her discharge from the hospital. Pt informed with the option for inpt rehab or home with [facility] however states when [facility] visits they would not accept her as a pt indicated "unsafe environment" due to risk of the pt falling in the home. Pt feels safe however in need of much care. Pt reports she is legally blind and her daughter helps when needed however this is difficult due to the time off of work for her daughter. States she is active with several community services [employer], [employer], [employer] and a history with [employer] which she has requested a referral for again today). Other topics reviewed and discussed today relates to the following medical issues: HTN reports this is stable with readings around the 140 and no encountered symptoms. DM pt reports unabe to find the blood on her finger sticks (legally blind) but her provider Dr. Jeanann Lewandowsky is aware with prescribed orders for ongoing Humalog 6 units AM 6 units lunch and 5 units PM with readings reported around 180 and A1C recently reports at <7 states pt.  COPD reports a issues due to her recent pneumonia but she continues to take her inhalers and prescribed medication with no reported issues however pt not aware of the COPD action plan and receptive to this education on a home visit.  RN discussed a plan of care and goals that will be in place for further discussion on the initial home visit arranged (2 weeks) based upon pt's schedule for an afternoon home visit (pt's preference).   Patient was recently discharged from hospital and all medications have been reviewed.  Raina Mina, RN BSN Care Management Coordinator [place] Office [phone]

## 2016-06-22 ENCOUNTER — Other Ambulatory Visit: Payer: Self-pay | Admitting: *Deleted

## 2016-06-22 ENCOUNTER — Encounter: Payer: Self-pay | Admitting: *Deleted

## 2016-06-22 NOTE — Patient Outreach (Signed)
[place] [facility]) Care Management  06/22/2016  [name] Jun 09, 1931 [id number]   CSW received a new referral on patient from Raina Mina, patient's RNCM with [place] [facility].  According to Ms. Zigmund Daniel, patient would benefit from social work services and resources to assist with referrals to various community agencies and resources. More specifically, Ms. Zigmund Daniel is wanting for CSW to make a referral for patient to [employer] through [employer] [facility] [place], at the expense of [employer], for 30 days, post Emergency Department visit on December 20th.  CSW has submitted a request to Josepha Pigg, Care Management Assistant, also with [place] [facility], to make the appropriate referral arrangements for patient.  CSW will assess patient's need for supportive services, as patient lives alone, is legally blind and receives minimal support from family members. CSW made an initial attempt to try and contact patient today to perform phone assessment, as well as assess and assist with social needs and services, without success.  A HIPAA complaint message was left for patient on voicemail.  CSW is currently awaiting a return call.  CSW will make a second outreach attempt within the next week, if CSW does not receive a return call from patient in the meantime. Nat Christen, BSW, MSW, LCSW  Licensed Education officer, environmental [facility] System  Mailing [place] [address], [place], [place] [address] Physical Address-[address], [place], [place] [address] Toll Free Main # [phone] Fax # [phone] Cell # [phone]  Fax # [phone]  [email or web address]

## 2016-06-29 ENCOUNTER — Ambulatory Visit (INDEPENDENT_AMBULATORY_CARE_PROVIDER_SITE_OTHER): Payer: Medicare Other | Admitting: Sports Medicine

## 2016-06-29 ENCOUNTER — Encounter: Payer: Self-pay | Admitting: Sports Medicine

## 2016-06-29 ENCOUNTER — Other Ambulatory Visit: Payer: Self-pay | Admitting: *Deleted

## 2016-06-29 DIAGNOSIS — S92325D Nondisplaced fracture of second metatarsal bone, left foot, subsequent encounter for fracture with routine healing: Secondary | ICD-10-CM

## 2016-06-29 NOTE — Assessment & Plan Note (Addendum)
[place] in the office confirmed non displaced left second metatarsal fracture. Neurovasularly intact and appears to be healing normally  -Will switch from heavier walker boot to post op shoe.  - reduce weight bearing and continue rolling walker use Consider arch strap if not painful - Will discuss with office staff and SW as needed for help obtaining homoe health services  This will be slow to heal with her chronic issues/ long term prednisone use  Reck in 2 wks

## 2016-06-29 NOTE — Progress Notes (Signed)
   Subjective:    Patient ID: [name], female    DOB: July 10, 1930, 83 y.o.   MRN: [id number]   CC: foot fracture  HPI:  83 y/o F presents for follow up second metatarsal fracture after falling on 06/13/2016  Second metarsal fracture - occurred on 12/19 after tripping on a sidewalk - at that time she felt weak from pneumonia - she was seen in the ED and Xray confirmed non displaced second metatarsal fracture - since then she has had pain in her foot treated with norco which she takes for chronic bilateral ankle pain - endorses continued foot swelling - able to bear weight and ambulate normally, does have some pain with ambulation 0- denies numbness or tingling of foot - feels the boot she was given is too heavy for her and the strap across the dorsal aspect of her foot is too heavy - has requested home heath, but was [facility] due to her many medical issues per patient report  Pneumonia - resolved, denies shortness of breath or fevers   Pertinent medical history: History of diabetes, history of chronic prednisone use, copd  Medication list reviewed  Review of Systems  Per HPI, else denies chest pain, shortness of breath, or cough  Swelling iof foot and ankle with standing RT   Objective:  BP 135/88   Pulse 85   Ht 5\' 2"  (1.575 m)   Wt 130 lb (59 kg)   BMI 23.78 kg/m  Vitals and nursing note reviewed  General: NAD MSK: left dorsal midfoot tenderness to palpation, and mild swelling, + DP and PT pulses, normal capillary refill, warm and well perfused TTP over base of 2nd MT Can stand but has pain  XRay reviewed: Fracture base of 2nd MT at [employer] - seems impacted; No rotation or displacemtn  [place] of left 2nd MT Cortical disruption seen on long and transverse view Mild hypoechoic swelling Increased doppler flow MT 1 and 3 normal MTT joints 1 2,and 3 are OK with no effusion  Ultrasound and interpretation by Wolfgang Phoenix. Fields,  MD    Assessment & Plan:    Nondisplaced fracture of second metatarsal bone, left foot, subsequent encounter for fracture with routine healing [place] in the office confirmed non displaced left second metatarsal fracture. Neurovasularly intact and appears to be healing normally  -Will switch from heavier walker boot to post op boot.  - reduce weight bearing and continue rolling walker use - Will discuss with office staff and SW as needed for help obtaining homoe health services    Lenice Koper A. Lincoln Brigham MD, [place] Resident PGY-3 Pager [phone]  I observed and examined the patient with the resident and agree with assessment and plan.  Note reviewed and modified by me. Stefanie Libel, MD

## 2016-07-03 ENCOUNTER — Other Ambulatory Visit: Payer: Self-pay | Admitting: *Deleted

## 2016-07-03 ENCOUNTER — Encounter: Payer: Self-pay | Admitting: *Deleted

## 2016-07-03 NOTE — Patient Outreach (Signed)
[place] [facility]) Care Management  07/03/2016  [name] [birth year] [id number]   CSW was able to make very brief contact with patient today, only to learn from patient's RNCM with [place] [facility], Raina Mina, who was in the home with patient, performing a home visit at the time of CSW's call, that patient has chosen to receive community case management services through [facility].  CSW, along with Ms. Zigmund Daniel, will proceed with case closure on patient so that [facility] may begin providing services to patient in the home.  Patient voiced understanding and was agreeable to this plan. Nat Christen, BSW, MSW, LCSW  Licensed Education officer, environmental Health System  Mailing [place] [address], [place], [place] [address] Physical Address-[address], [place], [place] [address] Toll Free Main # [phone] Fax # [phone] Cell # [phone]  Fax # [phone]  [email or web address]

## 2016-07-03 NOTE — Patient Outreach (Signed)
[place] [facility]) Care Management   07/03/2016  [name] 05-Apr-1931 [id number]  [name] is an 83 y.o. female  Subjective:  COPD: Pt reports she is breathing "okay" however some SOB with mobility. Pt reports she has a replacement boot to her left foot due to a recent fall in December. Pt with no falls since that time with use of her rollator and seat. Pt denies issues with breathing today and remains in the "GREEN" zone via action plan when discussed.  Pt denies any COPD medication with only FLONASE when needed. Pt reports she finished her antibiotics related to her recent visit to the hospital related to pneumonia and feels much better.  MEDICATIONS: Pt reports no needed refills using [facility] who deliveries pt's medications with no reported problems or issues.  States she is taking her medications as recommended and pt has finished all of her antibiotics for her recent pneumonia diagnosis. MEDICAL APPOINTMENTS: Pt reports sufficient transportation in getting to all her medical appointments with no delays or reported issues. Pt reports using [facility] for transportation services. Pt able to afford her transportation fees with no delays or problems at this time.  Pt has indicated that [facility] has visited last week with a social worker to address her needs. States the agency was suppose to contact this RN case manager to inform of there involvement.  Objective:   Review of Systems  Constitutional: Negative.   HENT: Negative.   Eyes: Negative.   Respiratory: Positive for sputum production.   Cardiovascular: Negative.   Gastrointestinal: Negative.   Genitourinary: Negative.   Musculoskeletal: Negative.   Neurological: Negative.   Endo/Heme/Allergies: Negative.   Psychiatric/Behavioral: Negative.     Physical Exam  Constitutional: She is oriented to person, place, and time. She appears well-developed and well-nourished.  HENT:  Right Ear:  External ear normal.  Left Ear: External ear normal.  Eyes: EOM are normal.  Neck: Normal range of motion.  Cardiovascular: Normal heart sounds.   Respiratory: Effort normal.  GI: Soft. Bowel sounds are normal.  Musculoskeletal: Normal range of motion.  Neurological: She is alert and oriented to person, place, and time.  Skin: Skin is warm and dry.  Psychiatric: She has a normal mood and affect. Her behavior is normal. Judgment and thought content normal.    Encounter Medications:   Outpatient Encounter Prescriptions as of 07/03/2016  Medication Sig  . aspirin EC 81 MG tablet Take 1 tablet (81 mg total) by mouth daily.  . cetirizine (ZYRTEC) 10 MG tablet Take 10 mg by mouth daily after breakfast.   . esomeprazole (NEXIUM) 40 MG capsule Take 40 mg by mouth daily at 12 noon.  . fluticasone (FLONASE) 50 MCG/ACT nasal spray Place 1 spray into the nose daily as needed for allergies.   [name] guaiFENesin (MUCINEX) 600 MG 12 hr tablet Take 600 mg by mouth daily as needed for to loosen phlegm.   [name] HYDROcodone-acetaminophen (NORCO/VICODIN) 5-325 MG tablet Take 1 tablet by mouth every 6 (six) hours as needed.  [name] imipramine (TOFRANIL) 25 MG tablet Take 75 mg by mouth at bedtime.   . insulin lispro (HUMALOG KWIKPEN) 100 UNIT/ML KiwkPen Inject 4-6 Units into the skin 3 (three) times daily with meals. Inject 6 units subcutaneously with breakfast and lunch, inject 5 units with supper  . levothyroxine (SYNTHROID, LEVOTHROID) 100 MCG tablet Take 100 mcg by mouth daily before breakfast.  . montelukast (SINGULAIR) 10 MG tablet Take 10 mg  by mouth daily with breakfast.  . triazolam (HALCION) 0.25 MG tablet Take 0.25 mg by mouth at bedtime as needed for sleep. For sleep   No facility-administered encounter medications on file as of 07/03/2016.     Functional Status:   In your present state of health, do you have any difficulty performing the following activities: 06/21/2016 10/01/2015  Hearing? N N  Vision? Y Y   Difficulty concentrating or making decisions? N N  Walking or climbing stairs? Y N  Dressing or bathing? N N  Doing errands, shopping? N N  Preparing Food and eating ? N -  Using the Toilet? N -  In the past six months, have you accidently leaked urine? Y -  Do you have problems with loss of bowel control? N -  Managing your Medications? N -  Managing your Finances? Y -  Housekeeping or managing your Housekeeping? Y -  Some recent data might be hidden    Fall/Depression Screening:    PHQ 2/9 Scores 06/21/2016 06/20/2016 03/30/2016 03/02/2016  PHQ - 2 Score 0 0 0 0  Exception Documentation - - - Other- indicate reason in comment box   BP 130/62 (BP Location: Left Arm, Patient Position: Sitting, Cuff Size: Normal)   Pulse 82   Resp 20   Ht 1.575 m (_0 )   Wt 130 lb 8 oz (59.2 kg)   SpO2 95%   BMI 23.87 kg/m  Assessment:   Introduction [facility] services for enrollment Case management related to COPD Adherence related to medications Adherence related to Medical appointments    Plan:  Will introduce pt once again to the [facility] services via consent obtained and physical assessment completed.  Will discussed the COPD action plan and verified pt remains in the GREEN action plan. Will educate pt on COPD and provide printed material for pt to review to increase her knowledge on this diagnosis. Will discuss what to do if symptoms are acute and different breathing techniques such as pursed lip breathing and to rest between her activities to lower the risk of distressful breathing. Will encouraged ongoing adherence with managing this condition, Will verified pt has enough refills  and able to afford all her medication for ongoing adherence with her recommended medications.  Will review all medications and verify pt's adherence. Will continue to encouraged adherence to prevent acute symptoms from occurring.  Will continue to encouraged ongoing adherence to lower her risk of acute symptoms.  Will  also verify pt has sufficient transportation services and able to afford the fees for transporting to and from all her appointments. Also encouraged pt to use both [facility] when needed.  Plan of care discussed along with goals met. Note pt has qualified for [facility]. Will contact the agency and verified if pt is active for services. RN spoke with Di Kindle who verified pt has been active with [facility] since November. Based upon this information pt no longer eligible for [facility] case management services. Note short term goals met as pt continues to review the COPD packet of information for increasing her knowledge base. Pt with clear understanding of the current active services with [facility] and discharge from the [facility] services due to their involvement. Pt very appreciative for the information provided and all that was discussed today. No other inquires or request as RN will alert the pt's primary care provider and the referral for Nat Christen, SW with [facility] concerning pt's discharge disposition with [facility] due to another case  management services now involved.  Raina Mina, RN Care Management Coordinator [place] Office [phone]

## 2016-07-04 ENCOUNTER — Ambulatory Visit: Payer: Medicare Other | Admitting: *Deleted

## 2016-07-06 ENCOUNTER — Emergency Department (HOSPITAL_COMMUNITY): Payer: Medicare Other

## 2016-07-06 ENCOUNTER — Emergency Department (HOSPITAL_COMMUNITY)
Admission: EM | Admit: 2016-07-06 | Discharge: 2016-07-06 | Disposition: A | Discharge status: Home / Self Care | Payer: Medicare Other | Attending: Emergency Medicine | Admitting: Emergency Medicine

## 2016-07-06 DIAGNOSIS — Z7982 Long term (current) use of aspirin: Secondary | ICD-10-CM | POA: Diagnosis not present

## 2016-07-06 DIAGNOSIS — Z87891 Personal history of nicotine dependence: Secondary | ICD-10-CM | POA: Diagnosis not present

## 2016-07-06 DIAGNOSIS — I252 Old myocardial infarction: Secondary | ICD-10-CM | POA: Insufficient documentation

## 2016-07-06 DIAGNOSIS — Z794 Long term (current) use of insulin: Secondary | ICD-10-CM | POA: Insufficient documentation

## 2016-07-06 DIAGNOSIS — J1108 Influenza due to unidentified influenza virus with specified pneumonia: Secondary | ICD-10-CM | POA: Diagnosis not present

## 2016-07-06 DIAGNOSIS — E119 Type 2 diabetes mellitus without complications: Secondary | ICD-10-CM | POA: Diagnosis not present

## 2016-07-06 DIAGNOSIS — R509 Fever, unspecified: Secondary | ICD-10-CM | POA: Diagnosis not present

## 2016-07-06 DIAGNOSIS — I2510 Atherosclerotic heart disease of native coronary artery without angina pectoris: Secondary | ICD-10-CM | POA: Insufficient documentation

## 2016-07-06 DIAGNOSIS — I10 Essential (primary) hypertension: Secondary | ICD-10-CM | POA: Insufficient documentation

## 2016-07-06 DIAGNOSIS — J181 Lobar pneumonia, unspecified organism: Secondary | ICD-10-CM

## 2016-07-06 DIAGNOSIS — Z79899 Other long term (current) drug therapy: Secondary | ICD-10-CM | POA: Diagnosis not present

## 2016-07-06 DIAGNOSIS — R0602 Shortness of breath: Secondary | ICD-10-CM | POA: Diagnosis not present

## 2016-07-06 DIAGNOSIS — R05 Cough: Secondary | ICD-10-CM | POA: Diagnosis not present

## 2016-07-06 DIAGNOSIS — J449 Chronic obstructive pulmonary disease, unspecified: Secondary | ICD-10-CM | POA: Diagnosis not present

## 2016-07-06 DIAGNOSIS — Z8673 Personal history of transient ischemic attack (TIA), and cerebral infarction without residual deficits: Secondary | ICD-10-CM | POA: Diagnosis not present

## 2016-07-06 DIAGNOSIS — Z8541 Personal history of malignant neoplasm of cervix uteri: Secondary | ICD-10-CM | POA: Insufficient documentation

## 2016-07-06 DIAGNOSIS — E039 Hypothyroidism, unspecified: Secondary | ICD-10-CM | POA: Insufficient documentation

## 2016-07-06 DIAGNOSIS — J189 Pneumonia, unspecified organism: Secondary | ICD-10-CM | POA: Diagnosis not present

## 2016-07-06 LAB — LACTIC ACID, PLASMA: Lactic Acid, Venous: 1.7 mmol/L (ref 0.5–1.9)

## 2016-07-06 LAB — CBC WITH DIFFERENTIAL/PLATELET
Basophils Absolute: 0.1 10*3/uL (ref 0.0–0.1)
Basophils Relative: 0 %
Eosinophils Absolute: 0.2 10*3/uL (ref 0.0–0.7)
Eosinophils Relative: 1 %
HCT: 36.4 % (ref 36.0–46.0)
Hemoglobin: 11.6 g/dL — ABNORMAL LOW (ref 12.0–15.0)
Lymphocytes Relative: 13 %
Lymphs Abs: 2.2 10*3/uL (ref 0.7–4.0)
MCH: 25.2 pg — ABNORMAL LOW (ref 26.0–34.0)
MCHC: 31.9 g/dL (ref 30.0–36.0)
MCV: 79.1 fL (ref 78.0–100.0)
Monocytes Absolute: 1.1 10*3/uL — ABNORMAL HIGH (ref 0.1–1.0)
Monocytes Relative: 6 %
Neutro Abs: 14 10*3/uL — ABNORMAL HIGH (ref 1.7–7.7)
Neutrophils Relative %: 80 %
Platelets: 222 10*3/uL (ref 150–400)
RBC: 4.6 MIL/uL (ref 3.87–5.11)
RDW: 13.7 % (ref 11.5–15.5)
WBC: 17.6 10*3/uL — ABNORMAL HIGH (ref 4.0–10.5)

## 2016-07-06 LAB — BASIC METABOLIC PANEL
Anion gap: 11 (ref 5–15)
BUN: 19 mg/dL (ref 6–20)
CO2: 24 mmol/L (ref 22–32)
Calcium: 9.2 mg/dL (ref 8.9–10.3)
Chloride: 97 mmol/L — ABNORMAL LOW (ref 101–111)
Creatinine, Ser: 0.75 mg/dL (ref 0.44–1.00)
GFR calc Af Amer: >60 mL/min (ref >60)
GFR calc non Af Amer: >60 mL/min (ref >60)
Glucose, Bld: 179 mg/dL — ABNORMAL HIGH (ref 65–99)
Potassium: 4.2 mmol/L (ref 3.5–5.1)
Sodium: 132 mmol/L — ABNORMAL LOW (ref 135–145)

## 2016-07-06 MED ORDER — ACETAMINOPHEN 500 MG PO TABS
1000.0000 mg | ORAL_TABLET | Freq: Once | ORAL | Status: AC
  Administered 2016-07-06: 1000 mg via ORAL
  Filled 2016-07-06: qty 2

## 2016-07-06 MED ORDER — DOXYCYCLINE HYCLATE 100 MG PO TABS
100.0000 mg | ORAL_TABLET | Freq: Once | ORAL | Status: AC
  Administered 2016-07-06: 100 mg via ORAL
  Filled 2016-07-06: qty 1

## 2016-07-06 MED ORDER — SODIUM CHLORIDE 0.9 % IV BOLUS (SEPSIS)
500.0000 mL | Freq: Once | INTRAVENOUS | Status: AC
  Administered 2016-07-06: 500 mL via INTRAVENOUS

## 2016-07-06 MED ORDER — DOXYCYCLINE HYCLATE 100 MG PO CAPS: 100.0000 mg | ORAL_CAPSULE | Freq: Two times a day (BID) | ORAL | 0 refills | Status: DC

## 2016-07-06 NOTE — Discharge Planning (Signed)
Discharge order placed. EDCM reviewed chart for possible CM needs.  No needs identified or communicated.  

## 2016-07-06 NOTE — ED Provider Notes (Signed)
[facility] DEPT Provider Note   CSN: [id number] Arrival date & time: 07/06/16  1226     History   Chief Complaint Chief Complaint  Patient presents with  . Fever    HPI [name] is a 83 y.o. female with PMH HTN, HLD, BOOP, CAD, DM, Hypothyroidism, Systemic Sclerosis who presents with fever.   Patient reports that she was in her usual state of health yesterday. She woke up this morning felling feverish and with chills. Her temperature was 102.60F at that time. She also reports of a frontal HA that is achy in nature with associated photophobia but no phonophobia, nausea, or vomiting. She also reports of neck pain; notes of chronic neck pain that is just worse today. Also reports feeling short of breath today. No dysuria, diarrhea, myalgias or arthralgias. No dizziness or lightheadedness. No chest pain. Of note, patient was seen in the ED on 12/22 and was diagnosed with pneumonia and given Augmentin; pt reports she had a sinus infection at that time as well. She completed her course on 12/27; she reports that her symptoms improved at that time. Reports of normal PO intake recently. Lives alone and no sick contacts.   Reports that she was on daily prednisone (per pulmonology) which she stopped after being seen by Sports Medicine on 06/30/15. Per chart review, pulmonology tried to taper off prednisone but she did not tolerate and therefore she was continued on Prednisone 5mg ; This was 12/2015.      Past Medical History:  Diagnosis Date  . Adenomatous colon polyp   . Arthritis   . Blood transfusion   . Bronchitis   . Cancer [facility])    cervical cancer pre hysterectomy  . Cervical disc disease   . Chronic sinusitis   . CONSTIPATION, CHRONIC 06/26/2007   Qualifier: Diagnosis of  By: Nils Pyle CMA ([place]), Mearl Latin    . COPD (chronic obstructive pulmonary disease) ([place])   . Coronary artery disease   . CVA (cerebral infarction) 1991  . Depression   . Diabetes mellitus   .  Diverticulosis   . 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 Dyslipidemia   . Emphysema   . Fibromyalgia   . GERD (gastroesophageal reflux disease)   . Hypertension   . Hypothyroidism   . IBS (irritable bowel syndrome)   . Legally blind   . Macular degeneration   . Myocardial infarction may 27th 2007  . Pneumonia 07/16/2013    HX PNEUMONIA  . Pneumonia   . Pulmonary hypertension   . Raynaud's phenomenon   . Scleroderma ([place])   . Sjogren's disease ([place])   . Sleep apnea    STOP BANG SCORE 5  . Stroke [facility]) 1990   brain stem stroke - weakness rt hand  . Zenker's diverticulum     Patient Active Problem List   Diagnosis Date Noted  . Nondisplaced fracture of second metatarsal bone, left foot, subsequent encounter for fracture with routine healing 06/29/2016  . Arthralgia of both lower legs 03/30/2016  . Hypothyroidism 10/01/2015  . HAP (hospital-acquired pneumonia) 05/16/2015  . Sepsis ([place]) 05/16/2015  . HCAP (healthcare-associated pneumonia) 05/16/2015  . Diarrhea 05/02/2015  . Diarrhea with dehydration 05/02/2015  . Hyponatremia 05/02/2015  . Weakness 01/29/2015  . Pain in joint, ankle and foot 11/20/2014  . Acute bronchitis 09/01/2014  . CAP (community acquired pneumonia) 05/26/2014  . Dysphagia, unspecified(787.20) 06/18/2013  . Stricture and stenosis of esophagus 06/18/2013  . Urinary incontinence in female 05/06/2012  . Fibromyalgia   .  Chronic confusional state 03/21/2012  . Postoperative anemia 03/21/2012  . Anemia of chronic disease 03/19/2012  . Diabetes mellitus ([place]) 05/30/2011  . Colonic stricture s/p lap sigmoid colectomy 03/18/2012 03/27/2011  . Diverticulosis of colon 03/27/2011  . Systemic sclerosis ([place]) 06/29/2009  . Pure hypercholesterolemia 05/25/2009  . BOOP (bronchiolitis obliterans with organizing pneumonia) ([place]) 02/18/2009  . Chronic rhinitis 07/08/2007  . EMPHYSEMA 07/08/2007  . Hyperlipidemia 06/26/2007  . Depression 06/26/2007  . Legal blindness  06/26/2007  . Essential hypertension 06/26/2007  . CORONARY ARTERY DISEASE 06/26/2007  . ZENKER'S DIVERTICULUM 06/26/2007  . Irritable bowel syndrome 06/26/2007    Past Surgical History:  Procedure Laterality Date  . APPENDECTOMY    . BALLOON DILATION N/A 07/15/2015   Procedure: BALLOON DILATION;  Surgeon: Milus Banister, MD;  Location: [facility];  Service: Endoscopy;  Laterality: N/A;  . BILATERAL OOPHORECTOMY  2002  . BRONCHOSCOPY  02-22-09  . CARDIAC CATHETERIZATION     x 3  . CATARACT EXTRACTION    . CHOLECYSTECTOMY  1965  . COLON SURGERY  2014   colon resection  . colonectomy    . ESOPHAGOGASTRODUODENOSCOPY (EGD) WITH ESOPHAGEAL DILATION N/A 06/18/2013   Procedure: ESOPHAGOGASTRODUODENOSCOPY (EGD) WITH ESOPHAGEAL DILATION;  Surgeon: Inda Castle, MD;  Location: [facility];  Service: Endoscopy;  Laterality: N/A;  . FLEXIBLE SIGMOIDOSCOPY N/A 07/15/2015   Procedure: FLEXIBLE SIGMOIDOSCOPY;  Surgeon: Milus Banister, MD;  Location: [facility];  Service: Endoscopy;  Laterality: N/A;  . lobe thyroid removal    . PROCTOSCOPY  03/18/2012   Procedure: PROCTOSCOPY;  Surgeon: Adin Hector, MD;  Location: [facility];  Service: General;  Laterality: N/A;  rigid procto  . RADIOLOGY WITH ANESTHESIA N/A 03/09/2015   Procedure: MRI CERVICAL SPINE WITHOUT AND LUMBER WITHOUT;  Surgeon: Medication Radiologist, MD;  Location: [facility];  Service: Radiology;  Laterality: N/A;  . Lane   and implantation of a plate   . STENTED CARDIAC  ARTERY  2007 / 2007 / 2010   X 3 STENT  . THYROID LOBECTOMY  1991   removal of left lobe thyroid  . TOTAL ABDOMINAL HYSTERECTOMY  1973    OB History    No data available       Home Medications    Prior to Admission medications   Medication Sig Start Date End Date Taking? Authorizing Provider  aspirin EC 81 MG tablet Take 1 tablet (81 mg total) by mouth daily. 11/18/13   Fay Records, MD  cetirizine (ZYRTEC) 10 MG tablet Take 10 mg by  mouth daily after breakfast.     Historical Provider, MD  doxycycline (VIBRAMYCIN) 100 MG capsule Take 1 capsule (100 mg total) by mouth 2 (two) times daily. One po bid x 7 days 07/06/16   Sherwood Gambler, MD  esomeprazole (NEXIUM) 40 MG capsule Take 40 mg by mouth daily at 12 noon.    Historical Provider, MD  fluticasone (FLONASE) 50 MCG/ACT nasal spray Place 1 spray into the nose daily as needed for allergies.  06/04/12   Historical Provider, MD  guaiFENesin (MUCINEX) 600 MG 12 hr tablet Take 600 mg by mouth daily as needed for to loosen phlegm.     Historical Provider, MD  HYDROcodone-acetaminophen (NORCO/VICODIN) 5-325 MG tablet Take 1 tablet by mouth every 6 (six) hours as needed. 06/14/16   Montine Circle, PA-C  imipramine (TOFRANIL) 25 MG tablet Take 75 mg by mouth at bedtime.  05/26/09   Historical Provider, MD  insulin  lispro (HUMALOG KWIKPEN) 100 UNIT/ML KiwkPen Inject 4-6 Units into the skin 3 (three) times daily with meals. Inject 6 units subcutaneously with breakfast and lunch, inject 5 units with supper    Historical Provider, MD  levothyroxine (SYNTHROID, LEVOTHROID) 100 MCG tablet Take 100 mcg by mouth daily before breakfast.    Historical Provider, MD  montelukast (SINGULAIR) 10 MG tablet Take 10 mg by mouth daily with breakfast. 05/10/15   Historical Provider, MD  triazolam (HALCION) 0.25 MG tablet Take 0.25 mg by mouth at bedtime as needed for sleep. For sleep    Historical Provider, MD    Family History Family History  Problem Relation Age of Onset  . Other Mother 83    Died from sepsis  . Stroke Father 68    died  . Heart disease Father   . Diabetes Father   . Cancer Sister     lung  . Diabetes Sister   . Cancer Brother     lung  . Diabetes Brother   . Diabetes Brother     Social History Social History  Substance Use Topics  . Smoking status: Former Smoker    Packs/day: 1.00    Years: 40.00    Types: Cigarettes    Quit date: 06/26/1988  . Smokeless tobacco:  Never Used     Comment: 40 pack years  . Alcohol use No     Allergies   Lantus [insulin glargine]; Levaquin [levofloxacin in d5w]; Maxidex [dexamethasone]; Metoprolol-hydrochlorothiazide; Norvasc [amlodipine]; Novolog [insulin aspart]; Penciclovir; Sulfonamide derivatives; Titanium; and Adhesive [tape]   Review of Systems Review of Systems  Constitutional: Positive for chills and fever. Negative for appetite change.  HENT: Positive for postnasal drip, rhinorrhea, sinus pain and sinus pressure.   Respiratory: Positive for cough and shortness of breath. Negative for chest tightness.   Cardiovascular: Negative for chest pain.  Gastrointestinal: Negative for abdominal pain, constipation, diarrhea, nausea and vomiting.  Genitourinary: Negative for dysuria, frequency and urgency.  Neurological: Positive for headaches. Negative for dizziness and light-headedness.     Physical Exam Updated Vital Signs BP 110/76   Pulse 84   Temp 98.6 F (37 C) (Oral)   Resp 19   SpO2 100%   Physical Exam  Constitutional: She is oriented to person, place, and time. No distress.  HENT:  Tenderness of the frontal sinuses bilaterally and maxillary sinuses bilaterally. No nasal discharge.  Mildly dry oropharynx   Eyes: Conjunctivae are normal. Pupils are equal, round, and reactive to light. Left eye exhibits no discharge.  Neck: Normal range of motion. Neck supple.  No nuchal rigidity   Cardiovascular: Normal rate and regular rhythm.  Exam reveals no gallop and no friction rub.   No murmur heard. Pulmonary/Chest: Effort normal. No respiratory distress. She has no wheezes. She has no rales.  Diminished breath sounds on the right lung compared to left.   Abdominal: Soft. Bowel sounds are normal. She exhibits no distension. There is no tenderness. There is no guarding.  Lymphadenopathy:    She has no cervical adenopathy.  Neurological: She is alert and oriented to person, place, and time. No sensory  deficit.  Normal strength bilaterally in upper and lower extremities   Skin: Skin is warm and dry. No rash noted. She is not diaphoretic.  Psychiatric: She has a normal mood and affect. Her behavior is normal.     ED Treatments / Results  Labs (all labs ordered are listed, but only abnormal results are displayed) Labs Reviewed  CBC WITH DIFFERENTIAL/PLATELET - Abnormal; Notable for the following:       Result Value   WBC 17.6 (*)    Hemoglobin 11.6 (*)    MCH 25.2 (*)    Neutro Abs 14.0 (*)    Monocytes Absolute 1.1 (*)    All other components within normal limits  BASIC METABOLIC PANEL - Abnormal; Notable for the following:    Sodium 132 (*)    Chloride 97 (*)    Glucose, Bld 179 (*)    All other components within normal limits  LACTIC ACID, PLASMA    EKG  EKG Interpretation  Date/Time:  Thursday July 06 2016 12:36:04 EST Ventricular Rate:  89 PR Interval:    QRS Duration: 74 QT Interval:  410 QTC Calculation: 499 R Axis:   67 Text Interpretation:  Sinus rhythm Borderline abnrm T, anterolateral leads Borderline prolonged QT interval nonspecific T wave changes in similar distribution to Jun 14 2016 Confirmed by Regenia Skeeter MD, [place] [phone]) on 07/06/2016 12:53:35 PM       Radiology Dg Chest 2 View  Result Date: 07/06/2016 CLINICAL DATA:  Cough, shortness of breath, fever, history of COP EXAM: CHEST  2 VIEW COMPARISON:  CT chest of 06/14/2016 and chest x-ray of the same day FINDINGS: The opacity in the right upper lobe is incompletely cleared consistent with incomplete resolution of pneumonia. However there is a new parenchymal opacity in the right lower lobe or right middle lobe suspicious for new focus of pneumonia as well. The left lung is clear. No pleural effusion is seen. Mediastinal and hilar contours are unremarkable. The heart is within upper limits of normal. No acute bony abnormality is seen. IMPRESSION: 1. Incomplete clearing of right upper lobe pneumonia  described previously. 2. New parenchymal opacity in the right lower lobe or right middle lobe consistent with a new focus of pneumonia. Electronically Signed   By: Ivar Drape M.D.   On: 07/06/2016 13:56    Procedures Procedures (including critical care time)  Medications Ordered in ED Medications  sodium chloride 0.9 % bolus 500 mL (0 mLs Intravenous Stopped 07/06/16 1456)  acetaminophen (TYLENOL) tablet 1,000 mg (1,000 mg Oral Given 07/06/16 1319)  doxycycline (VIBRA-TABS) tablet 100 mg (100 mg Oral Given 07/06/16 1455)     Initial Impression / Assessment and Plan / ED Course  I have reviewed the triage vital signs and the nursing notes.  Pertinent labs & imaging results that were available during my care of the patient were reviewed by me and considered in my medical decision making (see chart for details).  Clinical Course    Patient presents for fever of 102 at home, HA, shortness of breath. Her diastolic blood pressure is slightly low but patient is asymptomatic and non-toxic appearing. She is afebrile here in the ED and has normal oxygen saturation on room air. Will obtain CBC, BMP, and lactic acid. She is mildly dry on exam and due to low diastolic pressure, will give 500cc bolus and re-evaluate. Will also obtain CXR to evaluate for PNA. HA possibly due to sinusitis with frontal and maxillary tenderness on exam. Neuro exam is normal; of note she legally blind (visual fields not checked).   HA improved after medication and IVF. CBC with leukocytosis. Normal lactic acid. BMP overall unremarkable.  CXR shows incomplete clearing of Right upper lobe PNA and new parenchymal opacity in the RLL or RML consistent with a new focus of PNA. Patient's BP improved as well. Will give Doxycycline first  dose here. Will discharge with course of Doxycycline for CAP. Follow up with PCP in 2 days. Return precautions discussed.   Discussed with Dr. Regenia Skeeter.     Final Clinical Impressions(s) / ED  Diagnoses   Final diagnoses:  Community acquired pneumonia of right lower lobe of lung (HCC)    New Prescriptions New Prescriptions   DOXYCYCLINE (VIBRAMYCIN) 100 MG CAPSULE    Take 1 capsule (100 mg total) by mouth 2 (two) times daily. One po bid x 7 days     Smiley Houseman, MD 07/06/16 1459    Sherwood Gambler, MD 07/11/16 1550

## 2016-07-06 NOTE — ED Triage Notes (Signed)
P. CO of fever that was 102 at home around 4 AM. DIdn't take anything because she is allergic to a lot of medications. Treated for PNA in the month of Dec. And was D/C on the 21st. Hy of 3 MI's, and she CO of some mild SOB this AM.

## 2016-07-18 ENCOUNTER — Encounter: Payer: Self-pay | Admitting: Sports Medicine

## 2016-07-18 ENCOUNTER — Ambulatory Visit (INDEPENDENT_AMBULATORY_CARE_PROVIDER_SITE_OTHER): Payer: Medicare Other | Admitting: Sports Medicine

## 2016-07-18 DIAGNOSIS — S92325D Nondisplaced fracture of second metatarsal bone, left foot, subsequent encounter for fracture with routine healing: Secondary | ICD-10-CM

## 2016-07-18 NOTE — Progress Notes (Signed)
CC: Follow up left foot MT fracture  After 2 weeks in post op shoe no real pain Not using any of pain meds we presribed By end of day some swelling No real swelling in morning  Able to take 3 or 4 steps out of boot with no real pain Still feels a little unsteady when she stands on left foot  Uses cane for walking as she is legally blind  Past Med HX: As documented Recovering from recent pneumonia  ROS No numbness No redness Toes move without pain  PEXAM Pleasant NAD BP (!) 155/58   Ht 5\' 2"  (1.575 m)   Wt 130 lb (59 kg)   BMI 23.78 kg/m   Palpation of lisfranc joint is non tender Able to stand Able to take 4 steps No problem with movement of ankle or foot No obvious deformity of foot

## 2016-07-18 NOTE — Assessment & Plan Note (Signed)
This continues to heal more rapidly than expected Cont post op shoe x 2 more weeks ciont arch strap  Reck 2 weeks and repeat US If good signs of healing will then convert to shoe

## 2016-07-19 ENCOUNTER — Ambulatory Visit (INDEPENDENT_AMBULATORY_CARE_PROVIDER_SITE_OTHER): Payer: Medicare Other | Admitting: Pulmonary Disease

## 2016-07-19 ENCOUNTER — Encounter: Payer: Self-pay | Admitting: Pulmonary Disease

## 2016-07-19 VITALS — BP 130/82 | HR 87 | Temp 98.6°F | Ht 62.0 in | Wt 132.0 lb

## 2016-07-19 DIAGNOSIS — J329 Chronic sinusitis, unspecified: Secondary | ICD-10-CM

## 2016-07-19 DIAGNOSIS — J438 Other emphysema: Secondary | ICD-10-CM | POA: Diagnosis not present

## 2016-07-19 DIAGNOSIS — M349 Systemic sclerosis, unspecified: Secondary | ICD-10-CM | POA: Diagnosis not present

## 2016-07-19 DIAGNOSIS — J8489 Other specified interstitial pulmonary diseases: Secondary | ICD-10-CM

## 2016-07-19 MED ORDER — PREDNISONE 5 MG PO TABS: 5.0000 mg | ORAL_TABLET | Freq: Every day | ORAL | 3 refills | Status: DC

## 2016-07-19 NOTE — Progress Notes (Signed)
Current Outpatient Prescriptions on File Prior to Visit  Medication Sig  . aspirin EC 81 MG tablet Take 1 tablet (81 mg total) by mouth daily.  . cetirizine (ZYRTEC) 10 MG tablet Take 10 mg by mouth daily after breakfast.   . esomeprazole (NEXIUM) 40 MG capsule Take 40 mg by mouth daily at 12 noon.  . fluticasone (FLONASE) 50 MCG/ACT nasal spray Place 1 spray into the nose daily as needed for allergies.   [name] guaiFENesin (MUCINEX) 600 MG 12 hr tablet Take 600 mg by mouth daily as needed for to loosen phlegm.   [name] HYDROcodone-acetaminophen (NORCO) 7.5-325 MG tablet   . HYDROcodone-acetaminophen (NORCO/VICODIN) 5-325 MG tablet Take 1 tablet by mouth every 6 (six) hours as needed.  [name] imipramine (TOFRANIL) 25 MG tablet Take 75 mg by mouth at bedtime.   . insulin lispro (HUMALOG KWIKPEN) 100 UNIT/ML KiwkPen Inject 4-6 Units into the skin 3 (three) times daily with meals. Inject 6 units subcutaneously with breakfast and lunch, inject 5 units with supper  . levothyroxine (SYNTHROID, LEVOTHROID) 100 MCG tablet Take 100 mcg by mouth daily before breakfast.  . montelukast (SINGULAIR) 10 MG tablet Take 10 mg by mouth daily with breakfast.  . triazolam (HALCION) 0.25 MG tablet Take 0.25 mg by mouth at bedtime as needed for sleep. For sleep   No current facility-administered medications on file prior to visit.      Chief Complaint  Patient presents with  . Follow-up    Pt states that she has been sick x 1-2 months with sinus infection and PNA. Pt took Augmentin for sinus infection and Doxy for the PNA. Pt running low grade fever x 1 month. Pt c/o sneezing, chest congestion and cough.      Pulmonary tests August 2010 >> Bronchoscopy PFT 06/16/09 >> FEV1 1.99(118%), FEV1% 73, TLC 4.60(105%), DLCO 66%, no BD CT chest 11/24/11 >> centrilobular emphysema, RUL ASD CT sinus 07/30/13 >> opacification of Lt maxillary sinus, rightward NSD CT sinus 10/05/15 >> b/l mucosal edema paranasal sinuses, obstruction of  ostium Rt maxillary sinus  Past medical hx CAD, HTN, HLD, DM, CVA, Macular degeneration, GERD, Dysphagia, IBS, Depression, Fibromyalgia, Hypothyroidism, Sjogren syndrome, Raynaud's phenomenon, Scleroderma  Past surgical hx, Allergies, Family hx, Social hx all reviewed.  Vital Signs BP 130/82 (BP Location: Right Arm, Cuff Size: Normal)   Pulse 87   Temp 98.6 F (37 C) (Oral)   Ht 5\' 2"  (1.575 m)   Wt 132 lb (59.9 kg)   SpO2 96%   BMI 24.14 kg/m   History of Present Illness [name] is a 83 y.o. 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  She was seen by sports medicine.  There was concern that prednisone use was causing problem with bone density and cartilage.  She was taken off prednisone.  After this she started getting sinus problems, fever, cough, and fatigue.  She has been on several rounds of antibiotics, but her symptoms persist.  She had CT chest in December 2017 which showed infiltrate in Rt upper lung.  Physical Exam  General - pleasant ENT - mild maxillary sinus tenderness, clear to yellow drainage, no oral exudate, red spot on tongue, no LAN Cardiac - regular, no murmur Chest - faint crackles Rt upper lung Back - no tenderness Abd - soft, non tender Ext - no edema Neuro - normal strength Skin - no rashes Psych - normal mood  CMP Latest Ref Rng & Units 07/06/2016 06/14/2016 03/30/2016  Glucose 65 - 99 mg/dL 179(H) 186(H) 123(H)  BUN 6 - 20 mg/dL 19 22(H) 23  Creatinine 0.44 - 1.00 mg/dL 0.75 0.64 0.80  Sodium 135 - 145 mmol/L 132(L) 128(L) 137  Potassium 3.5 - 5.1 mmol/L 4.2 4.3 3.9  Chloride 101 - 111 mmol/L 97(L) 93(L) 98  CO2 22 - 32 mmol/L 24 27 22   Calcium 8.9 - 10.3 mg/dL 9.2 8.8(L) 9.1  Total Protein 6.5 - 8.1 g/dL - - -  Total Bilirubin 0.3 - 1.2 mg/dL - - -  Alkaline Phos 38 - 126 U/L - - -  AST 15 - 41 U/L - - -  ALT 14 - 54 U/L - - -    CBC Latest Ref  Rng & Units 07/06/2016 06/14/2016 03/30/2016  WBC 4.0 - 10.5 K/uL 17.6(H) 11.4(H) 8.2  Hemoglobin 12.0 - 15.0 g/dL 11.6(L) 11.4(L) 12.3  Hematocrit 36.0 - 46.0 % 36.4 33.3(L) 37.5  Platelets 150 - 400 K/uL 222 272 254    Dg Chest 2 View  Result Date: 07/06/2016 CLINICAL DATA:  Cough, shortness of breath, fever, history of COP EXAM: CHEST  2 VIEW COMPARISON:  CT chest of 06/14/2016 and chest x-ray of the same day FINDINGS: The opacity in the right upper lobe is incompletely cleared consistent with incomplete resolution of pneumonia. However there is a new parenchymal opacity in the right lower lobe or right middle lobe suspicious for new focus of pneumonia as well. The left lung is clear. No pleural effusion is seen. Mediastinal and hilar contours are unremarkable. The heart is within upper limits of normal. No acute bony abnormality is seen. IMPRESSION: 1. Incomplete clearing of right upper lobe pneumonia described previously. 2. New parenchymal opacity in the right lower lobe or right middle lobe consistent with a new focus of pneumonia. Electronically Signed   By: Ivar Drape M.D.   On: 07/06/2016 13:56    Discussion She gets recurrent episodes of sinusitis, pneumonitis, fever, fatigue.  These episodes do not improve completely with antibiotics.  She has consistent improvement when on prednisone.  Assessment/Plan  BOOP in setting of connective tissue disease. - will resume prednisone at 20 mg daily and taper to maintenance dose of 5 mg daily  Recurrent sinus infections. - continue flonase, singulair, zyrtec, mucinex - She has been seen previously by Dr. Lucia Gaskins with ENT  Hx of tobacco abuse with emphysema. - she has not noticed benefit from previous trials of inhaler therapy - monitor clinically  Hx of connective tissue disease. - f/u with rheumatology   Patient Instructions  Prednisone 5 mg pills > 4 pills daily for 2 days, 3 pills daily for 2 days, 2 pills daily for 2 days, then 1  pill daily  Follow up in 2 months with Dr. Beckey Rutter, MD [place] Pager:  [phone] 07/19/2016, 12:33 PM

## 2016-07-19 NOTE — Patient Instructions (Signed)
Prednisone 5 mg pills > 4 pills daily for 2 days, 3 pills daily for 2 days, 2 pills daily for 2 days, then 1 pill daily  Follow up in 2 months with Dr. Halford Chessman

## 2016-08-01 ENCOUNTER — Encounter: Payer: Self-pay | Admitting: Sports Medicine

## 2016-08-01 ENCOUNTER — Ambulatory Visit (INDEPENDENT_AMBULATORY_CARE_PROVIDER_SITE_OTHER): Payer: Medicare Other | Admitting: Sports Medicine

## 2016-08-01 ENCOUNTER — Ambulatory Visit: Payer: Self-pay

## 2016-08-01 VITALS — BP 136/47 | Ht 62.0 in | Wt 130.0 lb

## 2016-08-01 DIAGNOSIS — S92325D Nondisplaced fracture of second metatarsal bone, left foot, subsequent encounter for fracture with routine healing: Secondary | ICD-10-CM

## 2016-08-01 NOTE — Assessment & Plan Note (Signed)
At present she is making reasonable progress  Based on ultrasound it appears that her callous formation will be somewhat delayed We will continue using a postop shoe most of the time Start using a regular shoe for one to 2 hours at a time Plan is to wean out of the postop shoe with in 4 weeks  Rescan to see if complete healing in 4 weeks

## 2016-08-01 NOTE — Progress Notes (Signed)
F/U  Fracture of Left second metatarsal  Patient returns for repeat evaluation to monitor healing Her metatarsal fracture No longer feels painful She continues to use a postop shoe She can stand for longer periods of time without pain She can walk enough to do her normal activities  The patient is getting over another bout of bronchiolitis obliterans She is going to need to stay on prednisone She realizes this may be a factor in slower bone healing  Review of systems No fever or chills No swelling in the left foot Stiffness in the left ankle without swelling  Physical exam Elderly female in no acute distress BP (!) 136/47   Ht 5\' 2"  (1.575 m)   Wt 130 lb (59 kg)   BMI 23.78 kg/m   Palpation of the left foot reveals no significant tenderness over any of the metatarsal shafts The proximal second metatarsal shows no abnormal swelling No discoloration No tenderness to deep palpation She is walking without any significant pain while wearing postop shoe  Ultrasound examination The proximal second metatarsal continues to show a cortical disruption There is evidence of some callus formation There remains hypoechoic change suggestive of a healing hematoma There is increased Doppler flow Cap Sign is noted

## 2016-08-02 ENCOUNTER — Ambulatory Visit: Payer: Medicare Other | Admitting: *Deleted

## 2016-08-08 DIAGNOSIS — M5412 Radiculopathy, cervical region: Secondary | ICD-10-CM | POA: Diagnosis not present

## 2016-08-08 DIAGNOSIS — M542 Cervicalgia: Secondary | ICD-10-CM | POA: Diagnosis not present

## 2016-08-24 ENCOUNTER — Ambulatory Visit (INDEPENDENT_AMBULATORY_CARE_PROVIDER_SITE_OTHER): Payer: Medicare Other | Admitting: Sports Medicine

## 2016-08-24 ENCOUNTER — Encounter: Payer: Self-pay | Admitting: Sports Medicine

## 2016-08-24 DIAGNOSIS — S92325D Nondisplaced fracture of second metatarsal bone, left foot, subsequent encounter for fracture with routine healing: Secondary | ICD-10-CM

## 2016-08-24 NOTE — Assessment & Plan Note (Signed)
Improved. No pain with weightbearing or walking. No tenderness to palpation on exam. Ultrasound showed good callus formation.  She can be back in her regular shoe now. Recommended using the postop boot if pain or discomfort. Follow up only if needed.

## 2016-08-24 NOTE — Progress Notes (Signed)
Subjective:    [name] is a 83 y.o. old female here for follow-up on left second metatarsal bone fracture  HPI Patient is here for follow-up on left second metatarsal bone fracture. She was here about a month ago. At that time ultrasound showed some delay in callus formation. She was recommended to continue postop shoe for most of the time with regular shoe for 1-2 hours a day which she has been doing. She denies pain with weightbearing and walking. Admits some swelling in her left foot. She says she always have the swelling that comes and goes. She reports taking half a tablet of Norco once a day. She is optimistic that she will be off postop shoe. She has already brought her regular shoe with her.  Off note, patient has history of diabetes. She is also on steroid for chronic lung disease.  PMH/Problem List: has Pure hypercholesterolemia; Hyperlipidemia; Depression; Legal blindness; Essential hypertension; CORONARY ARTERY DISEASE; Chronic rhinitis; EMPHYSEMA; BOOP (bronchiolitis obliterans with organizing pneumonia) ([place]); ZENKER'S DIVERTICULUM; Irritable bowel syndrome; Systemic sclerosis ([place]); Colonic stricture s/p lap sigmoid colectomy 03/18/2012; Diverticulosis of colon; Diabetes mellitus ([place]); Anemia of chronic disease; Chronic confusional state; Postoperative anemia; Urinary incontinence in female; Fibromyalgia; Dysphagia, unspecified(787.20); Stricture and stenosis of esophagus; CAP (community acquired pneumonia); Acute bronchitis; Pain in joint, ankle and foot; Weakness; Diarrhea; Diarrhea with dehydration; Hyponatremia; HAP (hospital-acquired pneumonia); Sepsis ([place]); HCAP (healthcare-associated pneumonia); Hypothyroidism; Arthralgia of both lower legs; and Nondisplaced fracture of second metatarsal bone, left foot, subsequent encounter for fracture with routine healing on her problem list.   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  FH:  Family History  Problem Relation Age of Onset  . Other Mother 43    Died from sepsis  . Stroke Father 81    died  . Heart disease Father   . Diabetes Father   . Cancer Sister     lung  . Diabetes Sister   . Cancer Brother     lung  . Diabetes Brother   . Diabetes Brother     [facility] Social History  Substance Use Topics  . Smoking status: Former Smoker    Packs/day: 1.00    Years: 40.00    Types: Cigarettes    Quit date: 06/26/1988  . Smokeless tobacco: Never Used     Comment: 40 pack years  . Alcohol use No    Review of Systems Review of systems negative except for pertinent positives and negatives in history of present illness above.     Objective:     Vitals:   08/24/16 1421  BP: (!) 132/49  Pulse: 76  Weight: 130 lb (59 kg)  Height: 5\' 2"  (1.575 m)    Physical Exam GEN: appears well, no apparent distress. CVS: DP pulses 1+ bilaterally, PT pulses 2+ bilaterally, cap refill is less than 2 seconds bilaterally, both extremities cold to touch RESP: speaks in full sentence, no IWOB MSK:  Left foot: mild swelling over her left mid-foot, no skin erythema, some varicose veins, no significant tenderness to  palpation over her second metatarsal bones. She has some pains with range of motions (eversion, inversion  and drawer tests)  in her ankles left greater than left.  SKIN: no apparent skin lesion PSYCH: euthymic mood with congruent affect    Ultrasound of Metatarsal Second MT shows now a good pattern of hard callus bridging area of prior fractures at base of 2nd MT Impression - healed fracture of left 2nd MT  Ultrasound and interpretation by Peterson Ao B. Fields, MD  Assessment and Plan:  Nondisplaced fracture of second metatarsal bone, left foot, subsequent encounter for fracture with routine healing Improved. No pain with weightbearing or walking. No tenderness to palpation on exam. Ultrasound showed good callus formation.  She can be back in her regular shoe now. Recommended using the postop boot if pain or discomfort. Follow up only if needed.   Mercy Riding, MD 08/24/16 Pager: [phone]  I observed and examined the patient with the resident and agree with assessment and plan.  Note reviewed and modified by me. Stefanie Libel, MD

## 2016-09-18 ENCOUNTER — Encounter: Payer: Self-pay | Admitting: Pulmonary Disease

## 2016-09-18 ENCOUNTER — Ambulatory Visit (INDEPENDENT_AMBULATORY_CARE_PROVIDER_SITE_OTHER): Payer: Medicare Other | Admitting: Pulmonary Disease

## 2016-09-18 VITALS — BP 124/68 | HR 82 | Ht 62.0 in | Wt 135.0 lb

## 2016-09-18 DIAGNOSIS — J0101 Acute recurrent maxillary sinusitis: Secondary | ICD-10-CM | POA: Diagnosis not present

## 2016-09-18 DIAGNOSIS — J8489 Other specified interstitial pulmonary diseases: Secondary | ICD-10-CM | POA: Diagnosis not present

## 2016-09-18 DIAGNOSIS — J438 Other emphysema: Secondary | ICD-10-CM | POA: Diagnosis not present

## 2016-09-18 DIAGNOSIS — M349 Systemic sclerosis, unspecified: Secondary | ICD-10-CM | POA: Diagnosis not present

## 2016-09-18 MED ORDER — AMOXICILLIN-POT CLAVULANATE 875-125 MG PO TABS: 1.0000 | ORAL_TABLET | Freq: Two times a day (BID) | ORAL | 0 refills | Status: DC

## 2016-09-18 NOTE — Progress Notes (Signed)
Current Outpatient Prescriptions on File Prior to Visit  Medication Sig  . aspirin EC 81 MG tablet Take 1 tablet (81 mg total) by mouth daily.  . cetirizine (ZYRTEC) 10 MG tablet Take 10 mg by mouth daily after breakfast.   . esomeprazole (NEXIUM) 40 MG capsule Take 40 mg by mouth daily.   . fluticasone (FLONASE) 50 MCG/ACT nasal spray Place 1 spray into the nose daily as needed for allergies.   [name] guaiFENesin (MUCINEX) 600 MG 12 hr tablet Take 600 mg by mouth daily as needed for to loosen phlegm.   [name] HYDROcodone-acetaminophen (NORCO/VICODIN) 5-325 MG tablet Take 1 tablet by mouth every 6 (six) hours as needed.  [name] imipramine (TOFRANIL) 25 MG tablet Take 75 mg by mouth at bedtime.   . insulin lispro (HUMALOG KWIKPEN) 100 UNIT/ML KiwkPen Inject 4-6 Units into the skin 3 (three) times daily with meals. Inject 6 units subcutaneously with breakfast and lunch, inject 5 units with supper  . levothyroxine (SYNTHROID, LEVOTHROID) 100 MCG tablet Take 100 mcg by mouth daily before breakfast.  . montelukast (SINGULAIR) 10 MG tablet Take 10 mg by mouth daily with breakfast.  . predniSONE (DELTASONE) 5 MG tablet Take 1 tablet (5 mg total) by mouth daily with breakfast.  . triazolam (HALCION) 0.25 MG tablet Take 0.25 mg by mouth at bedtime as needed for sleep. For sleep   No current facility-administered medications on file prior to visit.      Chief Complaint  Patient presents with  . Follow-up    Pt states that she has been doing much better since being on Prednisone. Pt feels that she is getting a sinus infection, not being treated currently. Pt is requesting an abx to prevent infection from worsening and spreading to her chest. Using Mucinex for current symptoms. Needs Singulair refilled.      Pulmonary tests August 2010 >> Bronchoscopy PFT 06/16/09 >> FEV1 1.99(118%), FEV1% 73, TLC 4.60(105%), DLCO 66%, no BD CT chest 11/24/11 >> centrilobular emphysema, RUL ASD CT sinus 07/30/13 >> opacification of  Lt maxillary sinus, rightward NSD CT sinus 10/05/15 >> b/l mucosal edema paranasal sinuses, obstruction of ostium Rt maxillary sinus  Past medical history CAD, HTN, HLD, DM, CVA, Macular degeneration, GERD, Dysphagia, IBS, Depression, Fibromyalgia, Hypothyroidism, Sjogren syndrome, Raynaud's phenomenon, Scleroderma  Past surgical history, Family history, Social history, Allergies reviewed  Vital Signs BP 124/68 (BP Location: Left Arm, Cuff Size: Normal)   Pulse 82   Ht 5\' 2"  (1.575 m)   Wt 135 lb (61.2 kg)   SpO2 95%   BMI 24.69 kg/m   History of Present Illness [name] is a 83 y.o. 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  Her chest symptoms have improved since being on prednisone.  She is getting another sinus infection.  She has pressure over maxillary and frontal sinuses.  She is not sure if she has fever.  She has post nasal drip and this triggers cough.  Her ears feel congested.  She is not having chest pain, wheeze, abdominal pain, nausea, or diarrhea.  Physical Exam  General - pleasant Eyes - legally blind ENT - maxillary and frontal sinus tenderness, clear to yellow nasal drainage, stable red spot on tongue, no oral exudate, no LAN Cardiac - regular, no murmur Chest - no wheeze, rales Back - no tenderness Abd - soft, non tender Ext - no edema Neuro - normal strength Skin - no  rashes Psych - normal mood   Discussion She gets recurrent episodes of sinusitis, pneumonitis, fever, fatigue.  These episodes do not improve completely with antibiotics.  She has consistent improvement when on prednisone.  Assessment/Plan  BOOP in setting of connective tissue disease. - will continue on maintenance of 5 mg prednisone daily  Recurrent sinus infections with acute sinusitis. - will give another course of antibiotics with augmenting - continue flonase, singulair, zyrtec, and prn  mucinex - she has been seen by Dr. Lucia Gaskins with ENT previously  Hx of tobacco abuse with emphysema. - she has not noticed benefit from previous trials of inhaler therapy - monitor clinically  Hx of connective tissue disease. - f/u with rheumatology   Patient Instructions  Augmentin 1 pill twice per day for 7 days  Follow up in 6 months    Chesley Mires, MD [place] [facility] Pager:  [phone] 09/18/2016, 11:22 AM

## 2016-09-18 NOTE — Patient Instructions (Signed)
Augmentin 1 pill twice per day for 7 days  Follow up in 6 months

## 2016-09-20 ENCOUNTER — Emergency Department (HOSPITAL_COMMUNITY)
Admission: EM | Admit: 2016-09-20 | Discharge: 2016-09-20 | Disposition: A | Discharge status: Home / Self Care | Payer: Medicare Other | Attending: Emergency Medicine | Admitting: Emergency Medicine

## 2016-09-20 ENCOUNTER — Encounter (HOSPITAL_COMMUNITY): Payer: Self-pay | Admitting: Emergency Medicine

## 2016-09-20 ENCOUNTER — Telehealth: Payer: Self-pay | Admitting: Pulmonary Disease

## 2016-09-20 DIAGNOSIS — Z87891 Personal history of nicotine dependence: Secondary | ICD-10-CM | POA: Diagnosis not present

## 2016-09-20 DIAGNOSIS — E039 Hypothyroidism, unspecified: Secondary | ICD-10-CM | POA: Diagnosis not present

## 2016-09-20 DIAGNOSIS — J449 Chronic obstructive pulmonary disease, unspecified: Secondary | ICD-10-CM | POA: Insufficient documentation

## 2016-09-20 DIAGNOSIS — Z8673 Personal history of transient ischemic attack (TIA), and cerebral infarction without residual deficits: Secondary | ICD-10-CM | POA: Insufficient documentation

## 2016-09-20 DIAGNOSIS — Z794 Long term (current) use of insulin: Secondary | ICD-10-CM | POA: Diagnosis not present

## 2016-09-20 DIAGNOSIS — I10 Essential (primary) hypertension: Secondary | ICD-10-CM | POA: Insufficient documentation

## 2016-09-20 DIAGNOSIS — Z7982 Long term (current) use of aspirin: Secondary | ICD-10-CM | POA: Insufficient documentation

## 2016-09-20 DIAGNOSIS — I252 Old myocardial infarction: Secondary | ICD-10-CM | POA: Insufficient documentation

## 2016-09-20 DIAGNOSIS — K148 Other diseases of tongue: Secondary | ICD-10-CM

## 2016-09-20 DIAGNOSIS — E119 Type 2 diabetes mellitus without complications: Secondary | ICD-10-CM | POA: Insufficient documentation

## 2016-09-20 DIAGNOSIS — I2510 Atherosclerotic heart disease of native coronary artery without angina pectoris: Secondary | ICD-10-CM | POA: Insufficient documentation

## 2016-09-20 DIAGNOSIS — Z8541 Personal history of malignant neoplasm of cervix uteri: Secondary | ICD-10-CM | POA: Diagnosis not present

## 2016-09-20 DIAGNOSIS — R031 Nonspecific low blood-pressure reading: Secondary | ICD-10-CM | POA: Diagnosis not present

## 2016-09-20 DIAGNOSIS — R58 Hemorrhage, not elsewhere classified: Secondary | ICD-10-CM | POA: Diagnosis not present

## 2016-09-20 DIAGNOSIS — Z79899 Other long term (current) drug therapy: Secondary | ICD-10-CM | POA: Diagnosis not present

## 2016-09-20 LAB — COMPREHENSIVE METABOLIC PANEL
ALT: 11 U/L — ABNORMAL LOW (ref 14–54)
AST: 26 U/L (ref 15–41)
Albumin: 3.3 g/dL — ABNORMAL LOW (ref 3.5–5.0)
Alkaline Phosphatase: 63 U/L (ref 38–126)
Anion gap: 9 (ref 5–15)
BUN: 30 mg/dL — ABNORMAL HIGH (ref 6–20)
CO2: 24 mmol/L (ref 22–32)
Calcium: 8.3 mg/dL — ABNORMAL LOW (ref 8.9–10.3)
Chloride: 101 mmol/L (ref 101–111)
Creatinine, Ser: 0.74 mg/dL (ref 0.44–1.00)
GFR calc Af Amer: >60 mL/min (ref >60)
GFR calc non Af Amer: >60 mL/min (ref >60)
Glucose, Bld: 175 mg/dL — ABNORMAL HIGH (ref 65–99)
Potassium: 4.5 mmol/L (ref 3.5–5.1)
Sodium: 134 mmol/L — ABNORMAL LOW (ref 135–145)
Total Bilirubin: 0.7 mg/dL (ref 0.3–1.2)
Total Protein: 5.9 g/dL — ABNORMAL LOW (ref 6.5–8.1)

## 2016-09-20 LAB — LIPASE, BLOOD: Lipase: 36 U/L (ref 11–51)

## 2016-09-20 LAB — CBC WITH DIFFERENTIAL/PLATELET
Basophils Absolute: 0.1 10*3/uL (ref 0.0–0.1)
Basophils Relative: 1 %
Eosinophils Absolute: 0.2 10*3/uL (ref 0.0–0.7)
Eosinophils Relative: 1 %
HCT: 35.4 % — ABNORMAL LOW (ref 36.0–46.0)
Hemoglobin: 11.1 g/dL — ABNORMAL LOW (ref 12.0–15.0)
Lymphocytes Relative: 16 %
Lymphs Abs: 2 10*3/uL (ref 0.7–4.0)
MCH: 25.6 pg — ABNORMAL LOW (ref 26.0–34.0)
MCHC: 31.4 g/dL (ref 30.0–36.0)
MCV: 81.6 fL (ref 78.0–100.0)
Monocytes Absolute: 0.9 10*3/uL (ref 0.1–1.0)
Monocytes Relative: 7 %
Neutro Abs: 9.2 10*3/uL — ABNORMAL HIGH (ref 1.7–7.7)
Neutrophils Relative %: 75 %
Platelets: 221 10*3/uL (ref 150–400)
RBC: 4.34 MIL/uL (ref 3.87–5.11)
RDW: 15.8 % — ABNORMAL HIGH (ref 11.5–15.5)
WBC: 12.2 10*3/uL — ABNORMAL HIGH (ref 4.0–10.5)

## 2016-09-20 LAB — I-STAT CG4 LACTIC ACID, ED: Lactic Acid, Venous: 1.83 mmol/L (ref 0.5–1.9)

## 2016-09-20 LAB — APTT: aPTT: 26 seconds (ref 24–36)

## 2016-09-20 LAB — PROTIME-INR
INR: 1.06
Prothrombin Time: 13.8 seconds (ref 11.4–15.2)

## 2016-09-20 LAB — PREPARE RBC (CROSSMATCH)

## 2016-09-20 MED ORDER — SODIUM CHLORIDE 0.9 % IV BOLUS (SEPSIS)
500.0000 mL | Freq: Once | INTRAVENOUS | Status: AC
  Administered 2016-09-20: 500 mL via INTRAVENOUS

## 2016-09-20 MED ORDER — ONDANSETRON HCL 4 MG/2ML IJ SOLN
4.0000 mg | Freq: Four times a day (QID) | INTRAMUSCULAR | Status: DC | PRN
  Administered 2016-09-20: 4 mg via INTRAVENOUS
  Filled 2016-09-20: qty 2

## 2016-09-20 MED ORDER — SODIUM CHLORIDE 0.9 % IV SOLN
80.0000 mg | Freq: Once | INTRAVENOUS | Status: AC
  Administered 2016-09-20: 12:00:00 80 mg via INTRAVENOUS
  Filled 2016-09-20: qty 80

## 2016-09-20 MED ORDER — SODIUM CHLORIDE 0.9 % IV SOLN: Freq: Once | INTRAVENOUS | Status: DC

## 2016-09-20 NOTE — ED Provider Notes (Signed)
[facility] DEPT Provider Note   CSN: [id number] Arrival date & time: 09/20/16  1123     History   Chief Complaint Chief Complaint  Patient presents with  . GI Bleeding    HPI [name] is a 83 y.o. female.  [name] is a 83 y.o. Female with a history of Zenker's diverticulum, scleroderma, previous esophageal dilations, diabetes, COPD, MI, and previous CVA who presents to the ED via EMS reporting she is spitting up bright red blood from her mouth. She reports this began around 8:30 AM and has been persisting since. She reports she will intermittently spit up bright red blood from her mouth. She denies vomiting. She reports this is not from coughing. She denies any abdominal pain. She denies history of GI bleeds. She is not on anticoagulation. She denies NSAID use. She reports feeling lightheaded today. She denies fevers, coughing, shortness of breath, chest pain, abdominal pain, vomiting, diarrhea.    The history is provided by the patient, medical records and the EMS personnel. No language interpreter was used.    Past Medical History:  Diagnosis Date  . Adenomatous colon polyp   . Arthritis   . Blood transfusion   . Bronchitis   . Cancer [facility])    cervical cancer pre hysterectomy  . Cervical disc disease   . Chronic sinusitis   . CONSTIPATION, CHRONIC 06/26/2007   Qualifier: Diagnosis of  By: Nils Pyle CMA ([place]), Mearl Latin    . COPD (chronic obstructive pulmonary disease) ([place])   . Coronary artery disease   . CVA (cerebral infarction) 1991  . Depression   . Diabetes mellitus   . Diverticulosis   . 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 Dyslipidemia   . Emphysema   . Fibromyalgia   . GERD (gastroesophageal reflux disease)   . Hypertension   . Hypothyroidism   . IBS (irritable bowel syndrome)   . Legally blind   . Macular degeneration   . Myocardial infarction may 27th 2007  . Pneumonia 07/16/2013    HX PNEUMONIA  . Pneumonia   . Pulmonary hypertension     . Raynaud's phenomenon   . Scleroderma ([place])   . Sjogren's disease ([place])   . Sleep apnea    STOP BANG SCORE 5  . Stroke [facility]) 1990   brain stem stroke - weakness rt hand  . Zenker's diverticulum     Patient Active Problem List   Diagnosis Date Noted  . Nondisplaced fracture of second metatarsal bone, left foot, subsequent encounter for fracture with routine healing 06/29/2016  . Arthralgia of both lower legs 03/30/2016  . Hypothyroidism 10/01/2015  . HAP (hospital-acquired pneumonia) 05/16/2015  . Sepsis ([place]) 05/16/2015  . HCAP (healthcare-associated pneumonia) 05/16/2015  . Diarrhea 05/02/2015  . Diarrhea with dehydration 05/02/2015  . Hyponatremia 05/02/2015  . Weakness 01/29/2015  . Pain in joint, ankle and foot 11/20/2014  . Acute bronchitis 09/01/2014  . CAP (community acquired pneumonia) 05/26/2014  . Dysphagia, unspecified(787.20) 06/18/2013  . Stricture and stenosis of esophagus 06/18/2013  . Urinary incontinence in female 05/06/2012  . Fibromyalgia   . Chronic confusional state 03/21/2012  . Postoperative anemia 03/21/2012  . Anemia of chronic disease 03/19/2012  . Diabetes mellitus ([place]) 05/30/2011  . Colonic stricture s/p lap sigmoid colectomy 03/18/2012 03/27/2011  . Diverticulosis of colon 03/27/2011  . Systemic sclerosis ([place]) 06/29/2009  . Pure hypercholesterolemia 05/25/2009  . BOOP (bronchiolitis obliterans with organizing pneumonia) ([place]) 02/18/2009  . Chronic rhinitis 07/08/2007  . EMPHYSEMA 07/08/2007  .  Hyperlipidemia 06/26/2007  . Depression 06/26/2007  . Legal blindness 06/26/2007  . Essential hypertension 06/26/2007  . CORONARY ARTERY DISEASE 06/26/2007  . ZENKER'S DIVERTICULUM 06/26/2007  . Irritable bowel syndrome 06/26/2007    Past Surgical History:  Procedure Laterality Date  . APPENDECTOMY    . BALLOON DILATION N/A 07/15/2015   Procedure: BALLOON DILATION;  Surgeon: Milus Banister, MD;  Location: [facility];  Service: Endoscopy;   Laterality: N/A;  . BILATERAL OOPHORECTOMY  2002  . BRONCHOSCOPY  02-22-09  . CARDIAC CATHETERIZATION     x 3  . CATARACT EXTRACTION    . CHOLECYSTECTOMY  1965  . COLON SURGERY  2014   colon resection  . colonectomy    . ESOPHAGOGASTRODUODENOSCOPY (EGD) WITH ESOPHAGEAL DILATION N/A 06/18/2013   Procedure: ESOPHAGOGASTRODUODENOSCOPY (EGD) WITH ESOPHAGEAL DILATION;  Surgeon: Inda Castle, MD;  Location: [facility];  Service: Endoscopy;  Laterality: N/A;  . FLEXIBLE SIGMOIDOSCOPY N/A 07/15/2015   Procedure: FLEXIBLE SIGMOIDOSCOPY;  Surgeon: Milus Banister, MD;  Location: [facility];  Service: Endoscopy;  Laterality: N/A;  . lobe thyroid removal    . PROCTOSCOPY  03/18/2012   Procedure: PROCTOSCOPY;  Surgeon: Adin Hector, MD;  Location: [facility];  Service: General;  Laterality: N/A;  rigid procto  . RADIOLOGY WITH ANESTHESIA N/A 03/09/2015   Procedure: MRI CERVICAL SPINE WITHOUT AND LUMBER WITHOUT;  Surgeon: Medication Radiologist, MD;  Location: [facility];  Service: Radiology;  Laterality: N/A;  . [place]   and implantation of a plate   . STENTED CARDIAC  ARTERY  2007 / 2007 / 2010   X 3 STENT  . THYROID LOBECTOMY  1991   removal of left lobe thyroid  . TOTAL ABDOMINAL HYSTERECTOMY  1973    OB History    No data available       Home Medications    Prior to Admission medications   Medication Sig Start Date End Date Taking? Authorizing Provider  amoxicillin-clavulanate (AUGMENTIN) 875-125 MG tablet Take 1 tablet by mouth 2 (two) times daily. 09/18/16   Chesley Mires, MD  aspirin EC 81 MG tablet Take 1 tablet (81 mg total) by mouth daily. 11/18/13   Fay Records, MD  cetirizine (ZYRTEC) 10 MG tablet Take 10 mg by mouth daily after breakfast.     Historical Provider, MD  esomeprazole (NEXIUM) 40 MG capsule Take 40 mg by mouth daily.     Historical Provider, MD  fluticasone (FLONASE) 50 MCG/ACT nasal spray Place 1 spray into the nose daily as needed for allergies.   06/04/12   Historical Provider, MD  guaiFENesin (MUCINEX) 600 MG 12 hr tablet Take 600 mg by mouth daily as needed for to loosen phlegm.     Historical Provider, MD  HYDROcodone-acetaminophen (NORCO/VICODIN) 5-325 MG tablet Take 1 tablet by mouth every 6 (six) hours as needed. 06/14/16   Montine Circle, PA-C  imipramine (TOFRANIL) 25 MG tablet Take 75 mg by mouth at bedtime.  05/26/09   Historical Provider, MD  insulin lispro (HUMALOG KWIKPEN) 100 UNIT/ML KiwkPen Inject 4-6 Units into the skin 3 (three) times daily with meals. Inject 6 units subcutaneously with breakfast and lunch, inject 5 units with supper    Historical Provider, MD  levothyroxine (SYNTHROID, LEVOTHROID) 100 MCG tablet Take 100 mcg by mouth daily before breakfast.    Historical Provider, MD  montelukast (SINGULAIR) 10 MG tablet Take 10 mg by mouth daily with breakfast. 05/10/15   Historical Provider, MD  predniSONE (DELTASONE)  5 MG tablet Take 1 tablet (5 mg total) by mouth daily with breakfast. 07/19/16   Chesley Mires, MD  triazolam (HALCION) 0.25 MG tablet Take 0.25 mg by mouth at bedtime as needed for sleep. For sleep    Historical Provider, MD    Family History Family History  Problem Relation Age of Onset  . Other Mother 22    Died from sepsis  . Stroke Father 69    died  . Heart disease Father   . Diabetes Father   . Cancer Sister     lung  . Diabetes Sister   . Cancer Brother     lung  . Diabetes Brother   . Diabetes Brother     Social History Social History  Substance Use Topics  . Smoking status: Former Smoker    Packs/day: 1.00    Years: 40.00    Types: Cigarettes    Quit date: 06/26/1988  . Smokeless tobacco: Never Used     Comment: 40 pack years  . Alcohol use No     Allergies   Lantus [insulin glargine]; Levaquin [levofloxacin in d5w]; Maxidex [dexamethasone]; Metoprolol-hydrochlorothiazide; Norvasc [amlodipine]; Novolog [insulin aspart]; Penciclovir; Sulfonamide derivatives; Titanium; and  Adhesive [tape]   Review of Systems Review of Systems  Constitutional: Negative for chills and fever.  HENT: Negative for congestion, sore throat, trouble swallowing and voice change.        Spitting up blood.   Eyes: Negative for visual disturbance.  Respiratory: Negative for cough and shortness of breath.   Cardiovascular: Negative for chest pain.  Gastrointestinal: Positive for nausea. Negative for abdominal pain, diarrhea and vomiting.  Genitourinary: Negative for dysuria.  Musculoskeletal: Negative for back pain and neck pain.  Skin: Negative for rash.  Neurological: Positive for light-headedness. Negative for headaches.     Physical Exam Updated Vital Signs BP 127/72   Pulse 94   Resp (!) 22   Ht 5\' 2"  (1.575 m)   Wt 61.2 kg   SpO2 98%   BMI 24.69 kg/m   Physical Exam  Constitutional: She is oriented to person, place, and time. She appears well-developed and well-nourished. No distress.  Nontoxic appearing.  HENT:  Head: Normocephalic and atraumatic.  Bright red blood on tongue. No evidence of nosebleed. Nose is clear. No active vomiting or spitting up during exam. No active bleeding.   Eyes: Conjunctivae are normal. Pupils are equal, round, and reactive to light. Right eye exhibits no discharge. Left eye exhibits no discharge.  Neck: Neck supple.  Cardiovascular: Normal rate, regular rhythm, normal heart sounds and intact distal pulses.  Exam reveals no gallop and no friction rub.   No murmur heard. Pulmonary/Chest: Effort normal and breath sounds normal. No respiratory distress. She has no wheezes. She has no rales.  Abdominal: Soft. Bowel sounds are normal. She exhibits no distension and no mass. There is no tenderness. There is no guarding.  Abdomen is soft and nontender to palpation.  Musculoskeletal: She exhibits no edema.  Lymphadenopathy:    She has no cervical adenopathy.  Neurological: She is alert and oriented to person, place, and time. Coordination  normal.  Skin: Skin is warm and dry. No rash noted. She is not diaphoretic. No erythema. No pallor.  Psychiatric: She has a normal mood and affect. Her behavior is normal.  Nursing note and vitals reviewed.    ED Treatments / Results  Labs (all labs ordered are listed, but only abnormal results are displayed) Labs Reviewed  COMPREHENSIVE METABOLIC  PANEL - Abnormal; Notable for the following:       Result Value   Sodium 134 (*)    Glucose, Bld 175 (*)    BUN 30 (*)    Calcium 8.3 (*)    Total Protein 5.9 (*)    Albumin 3.3 (*)    ALT 11 (*)    All other components within normal limits  CBC WITH DIFFERENTIAL/PLATELET - Abnormal; Notable for the following:    WBC 12.2 (*)    Hemoglobin 11.1 (*)    HCT 35.4 (*)    MCH 25.6 (*)    RDW 15.8 (*)    Neutro Abs 9.2 (*)    All other components within normal limits  LIPASE, BLOOD  PROTIME-INR  APTT  I-STAT CG4 LACTIC ACID, ED  TYPE AND SCREEN  PREPARE RBC (CROSSMATCH)    EKG  EKG Interpretation None       Radiology No results found.  Procedures Procedures (including critical care time)  Medications Ordered in ED Medications  ondansetron (ZOFRAN) injection 4 mg (4 mg Intravenous Given 09/20/16 1205)  0.9 %  sodium chloride infusion (not administered)  sodium chloride 0.9 % bolus 500 mL (0 mLs Intravenous Stopped 09/20/16 1330)  pantoprazole (PROTONIX) 80 mg in sodium chloride 0.9 % 100 mL IVPB (0 mg Intravenous Stopped 09/20/16 1331)     Initial Impression / Assessment and Plan / ED Course  I have reviewed the triage vital signs and the nursing notes.  Pertinent labs & imaging results that were available during my care of the patient were reviewed by me and considered in my medical decision making (see chart for details).  Clinical Course as of Sep 21 1718  Wed Sep 20, 2016  1253 Spoke with GI Dr. Perley Jain nurse as he is in a procedure. She will relay the information and Dr. Watt Climes will call me back when he can.    [WD]    Clinical Course User Index [WD] Waynetta Pean, PA-C   Patient presented via EMS complaining of spitting up white red blood since this morning. On exam patient has dried blood in her mouth and her neuro posterior oropharynx. No active bleeding initially. This suspect she is having GI bleeding and blood work and orders were placed for likely upper GI bleed. I was called back to bedside by nursing staff report the patient is having bleeding again and has a blood clot on her tongue. With suction I was able to see that the patient is having bleeding from her tongue. She has a small area of hemorrhage that is about the size of a pinhead. It is actively bleeding profusely. Initially started to stop with pressure and then with silver nitrate without success. We are then placed moist gauze and pressure with successful stasis of the bleeding. I consulted with ENT Dr. Wilburn Cornelia who advised we could place topical epinephrine if the bleeding returned. Patient was ever observed for several hours in the emergency department after the bleeding stopped. She had no repeat bleeding. Her hemoglobin is around her baseline at 11.1. She ambulated without feeling lightheaded or dizzy. She ate applesauce and a sandwich without return of the bleeding. She feels well and is ready for discharge.  I discussed strict and specific return precautions with the patient. Advise if bleeding returns she needs to call 911 or proceed to the emergency department immediately. The patient and her daughter agree with this plan. They are thankful and think this is a good outcome. She will  follow up with ENT Dr. Wilburn Cornelia in office. I advised the patient to follow-up with their primary care provider this week. I advised the patient to return to the emergency department with new or worsening symptoms or new concerns. The patient and her daughter verbalized understanding and agreement with plan.   This patient was discussed with and evaluated  by Dr. Ashok Cordia who agrees with assessment and plan.    Final Clinical Impressions(s) / ED Diagnoses   Final diagnoses:  Hemorrhage of tongue    New Prescriptions Discharge Medication List as of 09/20/2016  5:08 PM       Waynetta Pean, PA-C 09/20/16 [place], MD 09/21/16 [phone]

## 2016-09-20 NOTE — ED Notes (Signed)
After suction and cleaning mouth pt appears to be bleeding from top of tongue. Silver nitrate stick being used by WiLL PA at this time.

## 2016-09-20 NOTE — ED Triage Notes (Signed)
To ED via [facility] from home, with c/o spitting up blood-- started suddenly this am, bright red blood with clots, moderate amount per EMS, pt was orthostatic at home. received 500cc NS.  On arrival-- pt is alert/oriented, bright red blood on tongue,

## 2016-09-20 NOTE — ED Notes (Signed)
PA will Danise at bedside due to pt has large blood clot on top of tongue that patient feels like is in back of throat. States will call GI again. pts airway patent.

## 2016-09-20 NOTE — Telephone Encounter (Signed)
Called and spoke to pt. Pt states she is throwing up frank blood with clots. Pt states this has never happened before. Pt states she is not SOB and is not experiencing any pain. Questioned if pt was throwing up which is coming from her stomach or if she was coughing it up which is coming from her lungs, pt was adamant that she was throwing up the blood. Pt states it was at least 1/2 cup. Advised pt to go to ED or UC. Pt states she does not want to go to ED but will go to UC.   Will forward this to VS as FYI.

## 2016-09-20 NOTE — Discharge Instructions (Signed)
Bleeding Tongue.  Please eat soft foods and be careful while brushing your teeth. Please try and stay well hydrated.  If your bleeding returns please call 911 or proceed to the ER.  Please follow up with Dr. Wilburn Cornelia.

## 2016-09-21 NOTE — Progress Notes (Signed)
Cardiology Office Note   Date:  09/22/2016   ID:  [name], [name] October 13, 1930, MRN [id number]  PCP:  Merrilee Seashore, MD  Cardiologist:   Dorris Carnes, MD   F/U of CAD    History of Present Illness: [name] is a 83 y.o. female with a history of CAD (s/p IWMI with stent to RCA;  Repeat stent to RCA for instent restenosis; s/p DES to [facility];  Last cath 2011:  40% LAD; 50% Lcx ; 80% small OM1: RCA 20%)  Also a history of BOOP  Sepsis in 2016 I saaw her in Sept 2017 Since seen she has had some fatigue   She was recently seen in ED with bleeding from her tongue  Mouth very dry  Pt says they had planned to transfuse but blood match was difficlut due to antibodies Pt notes no further bleeding  Says she is just tired        Current Meds  Medication Sig  . amoxicillin-clavulanate (AUGMENTIN) 875-125 MG tablet Take 1 tablet by mouth 2 (two) times daily.  [name] aspirin EC 81 MG tablet Take 1 tablet (81 mg total) by mouth daily.  . cetirizine (ZYRTEC) 10 MG tablet Take 10 mg by mouth daily after breakfast.   . esomeprazole (NEXIUM) 40 MG capsule Take 40 mg by mouth daily.   . fluticasone (FLONASE) 50 MCG/ACT nasal spray Place 1 spray into the nose daily as needed for allergies.   [name] guaiFENesin (MUCINEX) 600 MG 12 hr tablet Take 600 mg by mouth daily as needed for to loosen phlegm.   [name] HYDROcodone-acetaminophen (NORCO/VICODIN) 5-325 MG tablet Take 1 tablet by mouth every 6 (six) hours as needed.  [name] imipramine (TOFRANIL) 25 MG tablet Take 75 mg by mouth at bedtime.   . insulin lispro (HUMALOG KWIKPEN) 100 UNIT/ML KiwkPen Inject 4-6 Units into the skin 3 (three) times daily with meals. Inject 6 units subcutaneously with breakfast and lunch, inject 5 units with supper  . levothyroxine (SYNTHROID, LEVOTHROID) 100 MCG tablet Take 100 mcg by mouth daily before breakfast.  . montelukast (SINGULAIR) 10 MG tablet Take 10 mg by mouth daily with breakfast.  . predniSONE (DELTASONE) 5 MG  tablet Take 1 tablet (5 mg total) by mouth daily with breakfast.  . triazolam (HALCION) 0.25 MG tablet Take 0.25 mg by mouth at bedtime as needed for sleep. For sleep     Allergies:   Lantus [insulin glargine]; Levaquin [levofloxacin in d5w]; Maxidex [dexamethasone]; Metoprolol-hydrochlorothiazide; Norvasc [amlodipine]; Novolog [insulin aspart]; Penciclovir; Sulfonamide derivatives; Titanium; and Adhesive [tape]   Past Medical History:  Diagnosis Date  . Adenomatous colon polyp   . Arthritis   . Blood transfusion   . Bronchitis   . Cancer [facility])    cervical cancer pre hysterectomy  . Cervical disc disease   . Chronic sinusitis   . CONSTIPATION, CHRONIC 06/26/2007   Qualifier: Diagnosis of  By: Nils Pyle CMA ([place]), Mearl Latin    . COPD (chronic obstructive pulmonary disease) ([place])   . Coronary artery disease   . CVA (cerebral infarction) 1991  . Depression   . Diabetes mellitus   . Diverticulosis   . 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 Dyslipidemia   . Emphysema   . Fibromyalgia   . GERD (gastroesophageal reflux disease)   . Hypertension   . Hypothyroidism   . IBS (irritable bowel syndrome)   . Legally blind   . Macular degeneration   . Myocardial infarction may 27th 2007  . Pneumonia  07/16/2013    HX PNEUMONIA  . Pneumonia   . Pulmonary hypertension   . Raynaud's phenomenon   . Scleroderma ([place])   . Sjogren's disease ([place])   . Sleep apnea    STOP BANG SCORE 5  . 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 Zenker's diverticulum     Past Surgical History:  Procedure Laterality Date  . APPENDECTOMY    . BALLOON DILATION N/A 07/15/2015   Procedure: BALLOON DILATION;  Surgeon: Milus Banister, MD;  Location: [facility];  Service: Endoscopy;  Laterality: N/A;  . BILATERAL OOPHORECTOMY  2002  . BRONCHOSCOPY  02-22-09  . CARDIAC CATHETERIZATION     x 3  . CATARACT EXTRACTION    . CHOLECYSTECTOMY  1965  . COLON SURGERY  2014   colon resection  . colonectomy      . ESOPHAGOGASTRODUODENOSCOPY (EGD) WITH ESOPHAGEAL DILATION N/A 06/18/2013   Procedure: ESOPHAGOGASTRODUODENOSCOPY (EGD) WITH ESOPHAGEAL DILATION;  Surgeon: Inda Castle, MD;  Location: [place];  Service: Endoscopy;  Laterality: N/A;  . FLEXIBLE SIGMOIDOSCOPY N/A 07/15/2015   Procedure: FLEXIBLE SIGMOIDOSCOPY;  Surgeon: Milus Banister, MD;  Location: [facility];  Service: Endoscopy;  Laterality: N/A;  . lobe thyroid removal    . PROCTOSCOPY  03/18/2012   Procedure: PROCTOSCOPY;  Surgeon: Adin Hector, MD;  Location: [facility];  Service: General;  Laterality: N/A;  rigid procto  . RADIOLOGY WITH ANESTHESIA N/A 03/09/2015   Procedure: MRI CERVICAL SPINE WITHOUT AND LUMBER WITHOUT;  Surgeon: Medication Radiologist, MD;  Location: [place];  Service: Radiology;  Laterality: N/A;  . [place]   and implantation of a plate   . STENTED CARDIAC  ARTERY  2007 / 2007 / 2010   X 3 STENT  . THYROID LOBECTOMY  1991   removal of left lobe thyroid  . TOTAL ABDOMINAL HYSTERECTOMY  1973     Social History:  The patient  reports that she quit smoking about 28 years ago. Her smoking use included Cigarettes. She has a 40.00 pack-year smoking history. She has never used smokeless tobacco. She reports that she does not drink alcohol or use drugs.   Family History:  The patient's family history includes Cancer in her brother and sister; Diabetes in her brother, brother, father, and sister; Heart disease in her father; Other (age of onset: 75) in her mother; Stroke (age of onset: 74) in her father.    ROS:  Please see the history of present illness. All other systems are reviewed and  Negative to the above problem except as noted.    PHYSICAL EXAM: VS:  BP 140/62   Pulse 84   Ht 5\' 2"  (1.575 m)   Wt 137 lb (62.1 kg)   BMI 25.06 kg/m   GEN: Well nourished, well developed, in no acute distress  HEENT: normal  Neck: no JVD, carotid bruits, or masses Cardiac: RRR; no murmurs, rubs, or  gallops,Tr edema  Respiratory:  clear to auscultation bilaterally, normal work of breathing GI: soft, nontender, nondistended, + BS  No hepatomegaly  MS: no deformity Moving all extremities   Skin: warm and dry, no rash Neuro:  Strength and sensation are intact Psych: euthymic mood, full affect   EKG:  EKG is not  ordered today.   Lipid Panel    Component Value Date/Time   CHOL 193 03/24/2016 1031   TRIG 146 03/24/2016 1031   HDL 45 (L) 03/24/2016 1031  CHOLHDL 4.3 03/24/2016 1031   VLDL 29 03/24/2016 1031   LDLCALC 119 03/24/2016 1031   LDLDIRECT 127.6 05/15/2013 1422      Wt Readings from Last 3 Encounters:  09/22/16 137 lb (62.1 kg)  09/20/16 135 lb (61.2 kg)  09/18/16 135 lb (61.2 kg)      ASSESSMENT AND PLAN:  1  CAD  I am not convinced of active angina  I would check CBC and TSH with hx of fatigue    2  HTN  Fair  Control  I would not push meds further  3  HL  Intolerant to statins in past  4  BOOP  Has appt soon in pulmonary    F/U in the fall  Current medicines are reviewed at length with the patient today.  The patient does not have concerns regarding medicines.  Signed, Dorris Carnes, MD  09/22/2016 8:50 PM    [facility] [address], [place], [place]  [address] Phone: [phone]; Fax: [phone]

## 2016-09-21 NOTE — Telephone Encounter (Signed)
Noted  

## 2016-09-22 ENCOUNTER — Encounter: Payer: Self-pay | Admitting: Internal Medicine

## 2016-09-22 ENCOUNTER — Ambulatory Visit (INDEPENDENT_AMBULATORY_CARE_PROVIDER_SITE_OTHER): Payer: Medicare Other | Admitting: Internal Medicine

## 2016-09-22 VITALS — BP 140/62 | HR 84 | Ht 62.0 in | Wt 137.0 lb

## 2016-09-22 DIAGNOSIS — I2510 Atherosclerotic heart disease of native coronary artery without angina pectoris: Secondary | ICD-10-CM

## 2016-09-22 DIAGNOSIS — I10 Essential (primary) hypertension: Secondary | ICD-10-CM | POA: Diagnosis not present

## 2016-09-22 LAB — TYPE AND SCREEN
ABO/RH(D): B POS
Antibody Screen: NEGATIVE
Donor AG Type: NEGATIVE
Donor AG Type: NEGATIVE
Donor AG Type: NEGATIVE
Donor AG Type: NEGATIVE
Unit division: 0
Unit division: 0
Unit division: 0
Unit division: 0

## 2016-09-22 LAB — BPAM RBC
Blood Product Expiration Date: 201804162359
Blood Product Expiration Date: 201804202359
Blood Product Expiration Date: 201804202359
Blood Product Expiration Date: 201804212359
Unit Type and Rh: 5100
Unit Type and Rh: 5100
Unit Type and Rh: 5100
Unit Type and Rh: 5100

## 2016-09-22 NOTE — Patient Instructions (Signed)
Medication Instructions:   NO CHANGE  Labwork:  Your physician recommends that you return for lab work EE:FEOF CONVENIENT  Follow-Up:  Your physician wants you to follow-up in: [place] will receive a reminder letter in the mail two months in advance. If you don't receive a letter, please call our office to schedule the follow-up appointment.   If you need a refill on your cardiac medications before your next appointment, please call your pharmacy.

## 2016-09-23 LAB — BASIC METABOLIC PANEL
BUN/Creatinine Ratio: 51 — ABNORMAL HIGH (ref 12–28)
BUN: 33 mg/dL — ABNORMAL HIGH (ref 8–27)
CO2: 23 mmol/L (ref 18–29)
Calcium: 9 mg/dL (ref 8.7–10.3)
Chloride: 94 mmol/L — ABNORMAL LOW (ref 96–106)
Creatinine, Ser: 0.65 mg/dL (ref 0.57–1.00)
GFR calc Af Amer: 95 mL/min/{1.73_m2} (ref >59)
GFR calc non Af Amer: 82 mL/min/{1.73_m2} (ref >59)
Glucose: 165 mg/dL — ABNORMAL HIGH (ref 65–99)
Potassium: 4.3 mmol/L (ref 3.5–5.2)
Sodium: 135 mmol/L (ref 134–144)

## 2016-09-23 LAB — CBC
Hematocrit: 29.2 % — ABNORMAL LOW (ref 34.0–46.6)
Hemoglobin: 9.6 g/dL — ABNORMAL LOW (ref 11.1–15.9)
MCH: 26.3 pg — ABNORMAL LOW (ref 26.6–33.0)
MCHC: 32.9 g/dL (ref 31.5–35.7)
MCV: 80 fL (ref 79–97)
Platelets: 271 10*3/uL (ref 150–379)
RBC: 3.65 x10E6/uL — ABNORMAL LOW (ref 3.77–5.28)
RDW: 16.3 % — ABNORMAL HIGH (ref 12.3–15.4)
WBC: 12.9 10*3/uL — ABNORMAL HIGH (ref 3.4–10.8)

## 2016-09-25 ENCOUNTER — Telehealth: Payer: Self-pay | Admitting: Pulmonary Disease

## 2016-09-25 ENCOUNTER — Telehealth: Payer: Self-pay | Admitting: Internal Medicine

## 2016-09-25 DIAGNOSIS — D500 Iron deficiency anemia secondary to blood loss (chronic): Secondary | ICD-10-CM

## 2016-09-25 MED ORDER — FERROUS SULFATE 324 (65 FE) MG PO TBEC: 324.0000 mg | DELAYED_RELEASE_TABLET | ORAL | 99 refills | Status: DC

## 2016-09-25 NOTE — Telephone Encounter (Signed)
Spoke with pt who is aware of her recent lab results, to start iron supplement and repeat blood work in 4 weeks.  appt scheduled for blood work 5//8.

## 2016-09-25 NOTE — Telephone Encounter (Signed)
Follow Up:   Pt would like her lab results from Friday please.

## 2016-09-25 NOTE — Telephone Encounter (Signed)
Spoke with the pt  She is c/o increased DOE for the past 2 days  She is concerned that she may have aspirated some blood a few days ago when he tongue was bleeding  She states she can barely get across the room w/o stopping due to SOB  OV with RA for tomorrow and urged her to go to ED sooner if this persists or worsens

## 2016-09-26 ENCOUNTER — Telehealth: Payer: Self-pay | Admitting: Pulmonary Disease

## 2016-09-26 ENCOUNTER — Ambulatory Visit (INDEPENDENT_AMBULATORY_CARE_PROVIDER_SITE_OTHER)
Admission: RE | Admit: 2016-09-26 | Discharge: 2016-09-26 | Disposition: A | Discharge status: Home / Self Care | Payer: Medicare Other | Source: Ambulatory Visit | Attending: Pulmonary Disease | Admitting: Pulmonary Disease

## 2016-09-26 ENCOUNTER — Other Ambulatory Visit (INDEPENDENT_AMBULATORY_CARE_PROVIDER_SITE_OTHER): Payer: Medicare Other

## 2016-09-26 ENCOUNTER — Ambulatory Visit (INDEPENDENT_AMBULATORY_CARE_PROVIDER_SITE_OTHER): Payer: Medicare Other | Admitting: Pulmonary Disease

## 2016-09-26 ENCOUNTER — Encounter: Payer: Self-pay | Admitting: Pulmonary Disease

## 2016-09-26 VITALS — BP 124/68 | HR 93 | Ht 62.0 in | Wt 137.6 lb

## 2016-09-26 DIAGNOSIS — R0602 Shortness of breath: Secondary | ICD-10-CM | POA: Diagnosis not present

## 2016-09-26 DIAGNOSIS — J8489 Other specified interstitial pulmonary diseases: Secondary | ICD-10-CM | POA: Diagnosis not present

## 2016-09-26 DIAGNOSIS — J189 Pneumonia, unspecified organism: Secondary | ICD-10-CM

## 2016-09-26 DIAGNOSIS — I2510 Atherosclerotic heart disease of native coronary artery without angina pectoris: Secondary | ICD-10-CM | POA: Diagnosis not present

## 2016-09-26 DIAGNOSIS — R05 Cough: Secondary | ICD-10-CM | POA: Diagnosis not present

## 2016-09-26 LAB — CBC WITH DIFFERENTIAL/PLATELET
Basophils Absolute: 0.2 10*3/uL — ABNORMAL HIGH (ref 0.0–0.1)
Basophils Relative: 1.3 % (ref 0.0–3.0)
Eosinophils Absolute: 0.2 10*3/uL (ref 0.0–0.7)
Eosinophils Relative: 1.8 % (ref 0.0–5.0)
HCT: 33.7 % — ABNORMAL LOW (ref 36.0–46.0)
Hemoglobin: 10.9 g/dL — ABNORMAL LOW (ref 12.0–15.0)
Lymphocytes Relative: 31.2 % (ref 12.0–46.0)
Lymphs Abs: 3.8 10*3/uL (ref 0.7–4.0)
MCHC: 32.2 g/dL (ref 30.0–36.0)
MCV: 79.8 fl (ref 78.0–100.0)
Monocytes Absolute: 0.9 10*3/uL (ref 0.1–1.0)
Monocytes Relative: 7.6 % (ref 3.0–12.0)
Neutro Abs: 7.1 10*3/uL (ref 1.4–7.7)
Neutrophils Relative %: 58.1 % (ref 43.0–77.0)
Platelets: 347 10*3/uL (ref 150.0–400.0)
RBC: 4.22 Mil/uL (ref 3.87–5.11)
RDW: 16.8 % — ABNORMAL HIGH (ref 11.5–15.5)
WBC: 12.3 10*3/uL — ABNORMAL HIGH (ref 4.0–10.5)

## 2016-09-26 LAB — COMPREHENSIVE METABOLIC PANEL
ALT: 13 U/L (ref 0–35)
AST: 15 U/L (ref 0–37)
Albumin: 4.3 g/dL (ref 3.5–5.2)
Alkaline Phosphatase: 71 U/L (ref 39–117)
BUN: 28 mg/dL — ABNORMAL HIGH (ref 6–23)
CO2: 29 mEq/L (ref 19–32)
Calcium: 9.6 mg/dL (ref 8.4–10.5)
Chloride: 95 mEq/L — ABNORMAL LOW (ref 96–112)
Creatinine, Ser: 0.86 mg/dL (ref 0.40–1.20)
GFR: 66.86 mL/min (ref >60.00)
Glucose, Bld: 216 mg/dL — ABNORMAL HIGH (ref 70–99)
Potassium: 4.5 mEq/L (ref 3.5–5.1)
Sodium: 133 mEq/L — ABNORMAL LOW (ref 135–145)
Total Bilirubin: 0.4 mg/dL (ref 0.2–1.2)
Total Protein: 7.3 g/dL (ref 6.0–8.3)

## 2016-09-26 NOTE — Telephone Encounter (Signed)
Pt requesting cxr and lab results from today's office visit.   RA please advise on results.  Thanks!

## 2016-09-26 NOTE — Patient Instructions (Signed)
Blood work and chest x-ray today Based on results we will advise you regarding antibiotic Increase prednisone to 20 mg for 3 days then back down to 5 mg  Consider outpatient physical therapy

## 2016-09-26 NOTE — Progress Notes (Signed)
Subjective:    Patient ID: [name], female    DOB: 08-22-1930, 83 y.o.   MRN: [id number]  HPI  83 y.o. 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  Chief Complaint  Patient presents with  . Acute Visit    Has a history of PNA. Was in the hospital last week for a tongue bleed. Possibly aspirated blood in lungs, has been feeling SOB.    Acute visit today. She was seen 3/26 and given antibiotics for an acute maxillary sinusitis-she has seen ENT Dr. Lucia Gaskins. She was hospitalized for a tongue bleed -required cauterization, she bled 2 units, hemoglobin dropped from 11.6 in 06/2016-9.6 when rechecked on 3/30 WBC count was elevated at 12.9  She also reports a Zenker's diverticulum and some dysphagia and recurrent pneumonias. Whenever she gets a pneumonia apparently increasing prednisone and giving her an antibiotic helps. She now feels increasingly short of breath, weak, has a dry hacking cough, and for low-grade temperature 90.9 when measured-accompanied by her daughter today who corroborates history And has several questions   Chest x-ray 06/2016 shows right upper lobe infiltrate clearing and new right lower lobe infiltrates  Chest x-ray today does not show any infiltrate Labs showed her hemoglobin has improved from 9.6-10.9, mild hyponatremia, WBC count is stable although slight high  Pulmonary tests August 2010 >> Bronchoscopy PFT 06/16/09 >> FEV1 1.99(118%), FEV1% 73, TLC 4.60(105%), DLCO 66%, no BD CT chest 11/24/11 >> centrilobular emphysema, RUL ASD CT sinus 07/30/13 >> opacification of Lt maxillary sinus, rightward NSD CT sinus 10/05/15 >> b/l mucosal edema paranasal sinuses, obstruction of ostium Rt maxillary sinus  Past Medical History:  Diagnosis Date  . Adenomatous colon polyp   . Arthritis   . Blood transfusion   . Bronchitis   . Cancer [facility])    cervical cancer pre hysterectomy  .  Cervical disc disease   . Chronic sinusitis   . CONSTIPATION, CHRONIC 06/26/2007   Qualifier: Diagnosis of  By: Nils Pyle CMA ([place]), Mearl Latin    . COPD (chronic obstructive pulmonary disease) ([place])   . Coronary artery disease   . CVA (cerebral infarction) 1991  . Depression   . Diabetes mellitus   . Diverticulosis   . 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 Dyslipidemia   . Emphysema   . Fibromyalgia   . GERD (gastroesophageal reflux disease)   . Hypertension   . Hypothyroidism   . IBS (irritable bowel syndrome)   . Legally blind   . Macular degeneration   . Myocardial infarction may 27th 2007  . Pneumonia 07/16/2013    HX PNEUMONIA  . Pneumonia   . Pulmonary hypertension   . Raynaud's phenomenon   . Scleroderma ([place])   . Sjogren's disease ([place])   . Sleep apnea    STOP BANG SCORE 5  . 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 Zenker's diverticulum      Review of Systems neg for any significant sore throat, dysphagia, itching, sneezing, nasal congestion or excess/ purulent secretions, fever, chills, sweats, unintended wt loss, pleuritic or exertional cp, hempoptysis, orthopnea pnd or change in chronic leg swelling. Also denies presyncope, palpitations, heartburn, abdominal pain, nausea, vomiting, diarrhea or change in bowel or urinary habits, dysuria,hematuria, rash, arthralgias, visual complaints, headache, numbness weakness or ataxia.     Objective:   Physical Exam   Gen. Pleasant, well-nourished, in no distress ENT -  tongue lesion-cauterised, no post nasal drip Neck: No JVD, no thyromegaly, no carotid bruits Lungs: no use of accessory muscles, no dullness to percussion, clear without rales or rhonchi  Cardiovascular: Rhythm regular, heart sounds  normal, no murmurs or gallops, no peripheral edema Musculoskeletal: No deformities, no cyanosis or clubbing         Assessment & Plan:

## 2016-09-26 NOTE — Assessment & Plan Note (Signed)
CBC, CMET and chest x-ray today Based on results we will advise you regarding antibiotic -if WBC count is high, we'll consider Levaquin   Consider outpatient physical therapy

## 2016-09-26 NOTE — Assessment & Plan Note (Addendum)
Increase prednisone to 20 mg for 3 days then back down to 5 mg Aspiration of blood is another possibility -but no infiltrates noted on chest x-ray. In the absence of new infiltrate, doubt that her BOOP is flaring up

## 2016-09-27 NOTE — Telephone Encounter (Signed)
Results given to patient already VS to review for other recommendations

## 2016-09-27 NOTE — Telephone Encounter (Signed)
Will send to Dr Halford Chessman for recommendations.   Notes recorded by Lorane Gell, CMA on 09/26/2016 at 5:36 PM EDT Pt is aware of results. Pt would like to have VS look over her results and advise on what she should do for her SOB. Pt is aware that VS will be back in our office 10-05-16. ------ Notes recorded by Rigoberto Noel, MD on 09/26/2016 at 5:29 PM EDT Chest x-ray did not show pneumonia Hemoglobin is improved to 10.6 WBC count is stable Complete course of Augmentin-does not need more antibiotics

## 2016-09-28 NOTE — Assessment & Plan Note (Signed)
No evidence of pneumonia on chest x-ray today

## 2016-10-02 NOTE — Telephone Encounter (Signed)
Spoke with pt.    Reviewed CXR and labs from 09/26/16.  No significant findings to explain her symptoms of dyspnea.  She has home nurse >> advised her to try taking opiate pain medications.  This helps her breathing.  She has hx of CAD and followed by Dr. Harrington Challenger.  Advised her to d/w Dr. Harrington Challenger about whether her dyspnea could be angina equivalent.

## 2016-10-16 DIAGNOSIS — E039 Hypothyroidism, unspecified: Secondary | ICD-10-CM | POA: Diagnosis not present

## 2016-10-16 DIAGNOSIS — I10 Essential (primary) hypertension: Secondary | ICD-10-CM | POA: Diagnosis not present

## 2016-10-16 DIAGNOSIS — E119 Type 2 diabetes mellitus without complications: Secondary | ICD-10-CM | POA: Diagnosis not present

## 2016-10-16 DIAGNOSIS — E1165 Type 2 diabetes mellitus with hyperglycemia: Secondary | ICD-10-CM | POA: Diagnosis not present

## 2016-10-31 ENCOUNTER — Other Ambulatory Visit: Payer: Medicare Other | Admitting: *Deleted

## 2016-10-31 DIAGNOSIS — D500 Iron deficiency anemia secondary to blood loss (chronic): Secondary | ICD-10-CM | POA: Diagnosis not present

## 2016-10-31 LAB — CBC
Hematocrit: 34.1 % (ref 34.0–46.6)
Hemoglobin: 10.8 g/dL — ABNORMAL LOW (ref 11.1–15.9)
MCH: 24.4 pg — ABNORMAL LOW (ref 26.6–33.0)
MCHC: 31.7 g/dL (ref 31.5–35.7)
MCV: 77 fL — ABNORMAL LOW (ref 79–97)
Platelets: 363 10*3/uL (ref 150–379)
RBC: 4.42 x10E6/uL (ref 3.77–5.28)
RDW: 15 % (ref 12.3–15.4)
WBC: 10.5 10*3/uL (ref 3.4–10.8)

## 2016-11-17 ENCOUNTER — Inpatient Hospital Stay (HOSPITAL_COMMUNITY)
Admission: EM | Admit: 2016-11-17 | Discharge: 2016-11-19 | DRG: 392 | Disposition: A | Discharge status: Home / Self Care | Payer: Medicare Other | Attending: Internal Medicine | Admitting: Internal Medicine

## 2016-11-17 ENCOUNTER — Emergency Department (HOSPITAL_COMMUNITY): Payer: Medicare Other

## 2016-11-17 ENCOUNTER — Encounter (HOSPITAL_COMMUNITY): Payer: Self-pay | Admitting: Emergency Medicine

## 2016-11-17 DIAGNOSIS — I252 Old myocardial infarction: Secondary | ICD-10-CM

## 2016-11-17 DIAGNOSIS — R112 Nausea with vomiting, unspecified: Secondary | ICD-10-CM | POA: Diagnosis not present

## 2016-11-17 DIAGNOSIS — K2970 Gastritis, unspecified, without bleeding: Secondary | ICD-10-CM | POA: Diagnosis not present

## 2016-11-17 DIAGNOSIS — E039 Hypothyroidism, unspecified: Secondary | ICD-10-CM

## 2016-11-17 DIAGNOSIS — Z794 Long term (current) use of insulin: Secondary | ICD-10-CM

## 2016-11-17 DIAGNOSIS — I2510 Atherosclerotic heart disease of native coronary artery without angina pectoris: Secondary | ICD-10-CM | POA: Diagnosis present

## 2016-11-17 DIAGNOSIS — E876 Hypokalemia: Secondary | ICD-10-CM | POA: Diagnosis not present

## 2016-11-17 DIAGNOSIS — E860 Dehydration: Secondary | ICD-10-CM | POA: Diagnosis not present

## 2016-11-17 DIAGNOSIS — K529 Noninfective gastroenteritis and colitis, unspecified: Secondary | ICD-10-CM | POA: Diagnosis not present

## 2016-11-17 DIAGNOSIS — M349 Systemic sclerosis, unspecified: Secondary | ICD-10-CM | POA: Diagnosis not present

## 2016-11-17 DIAGNOSIS — Z888 Allergy status to other drugs, medicaments and biological substances status: Secondary | ICD-10-CM | POA: Diagnosis not present

## 2016-11-17 DIAGNOSIS — D638 Anemia in other chronic diseases classified elsewhere: Secondary | ICD-10-CM | POA: Diagnosis present

## 2016-11-17 DIAGNOSIS — Z90710 Acquired absence of both cervix and uterus: Secondary | ICD-10-CM

## 2016-11-17 DIAGNOSIS — Z8541 Personal history of malignant neoplasm of cervix uteri: Secondary | ICD-10-CM

## 2016-11-17 DIAGNOSIS — E119 Type 2 diabetes mellitus without complications: Secondary | ICD-10-CM | POA: Diagnosis present

## 2016-11-17 DIAGNOSIS — H3530 Unspecified macular degeneration: Secondary | ICD-10-CM | POA: Diagnosis present

## 2016-11-17 DIAGNOSIS — K219 Gastro-esophageal reflux disease without esophagitis: Secondary | ICD-10-CM | POA: Diagnosis present

## 2016-11-17 DIAGNOSIS — R1110 Vomiting, unspecified: Secondary | ICD-10-CM | POA: Diagnosis not present

## 2016-11-17 DIAGNOSIS — R197 Diarrhea, unspecified: Secondary | ICD-10-CM | POA: Diagnosis not present

## 2016-11-17 DIAGNOSIS — Z7952 Long term (current) use of systemic steroids: Secondary | ICD-10-CM

## 2016-11-17 DIAGNOSIS — Z8673 Personal history of transient ischemic attack (TIA), and cerebral infarction without residual deficits: Secondary | ICD-10-CM

## 2016-11-17 DIAGNOSIS — Z7982 Long term (current) use of aspirin: Secondary | ICD-10-CM

## 2016-11-17 LAB — COMPREHENSIVE METABOLIC PANEL
ALT: 13 U/L — ABNORMAL LOW (ref 14–54)
AST: 19 U/L (ref 15–41)
Albumin: 3.8 g/dL (ref 3.5–5.0)
Alkaline Phosphatase: 58 U/L (ref 38–126)
Anion gap: 7 (ref 5–15)
BUN: 21 mg/dL — ABNORMAL HIGH (ref 6–20)
CO2: 28 mmol/L (ref 22–32)
Calcium: 8.2 mg/dL — ABNORMAL LOW (ref 8.9–10.3)
Chloride: 100 mmol/L — ABNORMAL LOW (ref 101–111)
Creatinine, Ser: 0.7 mg/dL (ref 0.44–1.00)
GFR calc Af Amer: >60 mL/min (ref >60)
GFR calc non Af Amer: >60 mL/min (ref >60)
Glucose, Bld: 172 mg/dL — ABNORMAL HIGH (ref 65–99)
Potassium: 3.3 mmol/L — ABNORMAL LOW (ref 3.5–5.1)
Sodium: 135 mmol/L (ref 135–145)
Total Bilirubin: 0.6 mg/dL (ref 0.3–1.2)
Total Protein: 6.5 g/dL (ref 6.5–8.1)

## 2016-11-17 LAB — URINALYSIS, ROUTINE W REFLEX MICROSCOPIC
Bilirubin Urine: NEGATIVE
Glucose, UA: NEGATIVE mg/dL
Hgb urine dipstick: NEGATIVE
Ketones, ur: NEGATIVE mg/dL
Nitrite: NEGATIVE
Protein, ur: NEGATIVE mg/dL
Specific Gravity, Urine: 1.011 (ref 1.005–1.030)
pH: 7 (ref 5.0–8.0)

## 2016-11-17 LAB — CBC
HCT: 32.6 % — ABNORMAL LOW (ref 36.0–46.0)
Hemoglobin: 10.2 g/dL — ABNORMAL LOW (ref 12.0–15.0)
MCH: 24.5 pg — ABNORMAL LOW (ref 26.0–34.0)
MCHC: 31.3 g/dL (ref 30.0–36.0)
MCV: 78.2 fL (ref 78.0–100.0)
Platelets: 233 10*3/uL (ref 150–400)
RBC: 4.17 MIL/uL (ref 3.87–5.11)
RDW: 14.2 % (ref 11.5–15.5)
WBC: 9.3 10*3/uL (ref 4.0–10.5)

## 2016-11-17 LAB — LIPASE, BLOOD: Lipase: 36 U/L (ref 11–51)

## 2016-11-17 MED ORDER — INSULIN ASPART 100 UNIT/ML ~~LOC~~ SOLN
0.0000 [IU] | SUBCUTANEOUS | Status: DC
Start: 2016-11-18 — End: 2016-11-18
  Administered 2016-11-18: 1 [IU] via SUBCUTANEOUS

## 2016-11-17 MED ORDER — ONDANSETRON HCL 4 MG/2ML IJ SOLN: 4.0000 mg | Freq: Once | INTRAMUSCULAR | Status: DC | PRN

## 2016-11-17 MED ORDER — MONTELUKAST SODIUM 10 MG PO TABS
10.0000 mg | ORAL_TABLET | Freq: Every day | ORAL | Status: DC
  Administered 2016-11-18 – 2016-11-19 (×2): 10 mg via ORAL
  Filled 2016-11-17 (×2): qty 1

## 2016-11-17 MED ORDER — LEVOTHYROXINE SODIUM 100 MCG PO TABS
100.0000 ug | ORAL_TABLET | Freq: Every day | ORAL | Status: DC
  Administered 2016-11-18 – 2016-11-19 (×2): 100 ug via ORAL
  Filled 2016-11-17 (×2): qty 1

## 2016-11-17 MED ORDER — ASPIRIN EC 81 MG PO TBEC
81.0000 mg | DELAYED_RELEASE_TABLET | Freq: Every day | ORAL | Status: DC
  Administered 2016-11-18 – 2016-11-19 (×2): 81 mg via ORAL
  Filled 2016-11-17 (×2): qty 1

## 2016-11-17 MED ORDER — IOPAMIDOL (ISOVUE-300) INJECTION 61%
100.0000 mL | Freq: Once | INTRAVENOUS | Status: AC | PRN
  Administered 2016-11-17: 100 mL via INTRAVENOUS

## 2016-11-17 MED ORDER — PANTOPRAZOLE SODIUM 40 MG PO TBEC
40.0000 mg | DELAYED_RELEASE_TABLET | Freq: Every day | ORAL | Status: DC
  Administered 2016-11-18 – 2016-11-19 (×2): 40 mg via ORAL
  Filled 2016-11-17 (×2): qty 1

## 2016-11-17 MED ORDER — FLUTICASONE PROPIONATE 50 MCG/ACT NA SUSP
1.0000 | Freq: Every day | NASAL | Status: DC | PRN
  Filled 2016-11-17: qty 16

## 2016-11-17 MED ORDER — HYDROCODONE-ACETAMINOPHEN 5-325 MG PO TABS: 0.5000 | ORAL_TABLET | Freq: Four times a day (QID) | ORAL | Status: DC | PRN

## 2016-11-17 MED ORDER — SODIUM CHLORIDE 0.9 % IV BOLUS (SEPSIS)
1000.0000 mL | Freq: Once | INTRAVENOUS | Status: AC
  Administered 2016-11-17: 1000 mL via INTRAVENOUS

## 2016-11-17 MED ORDER — SODIUM CHLORIDE 0.9 % IV SOLN
INTRAVENOUS | Status: DC
  Administered 2016-11-17 – 2016-11-18 (×3): via INTRAVENOUS

## 2016-11-17 MED ORDER — PREDNISONE 5 MG PO TABS
5.0000 mg | ORAL_TABLET | Freq: Every day | ORAL | Status: DC
  Administered 2016-11-18 – 2016-11-19 (×2): 5 mg via ORAL
  Filled 2016-11-17 (×2): qty 1

## 2016-11-17 MED ORDER — ONDANSETRON HCL 4 MG/2ML IJ SOLN
4.0000 mg | Freq: Once | INTRAMUSCULAR | Status: AC
  Administered 2016-11-17: 4 mg via INTRAVENOUS
  Filled 2016-11-17: qty 2

## 2016-11-17 MED ORDER — IMIPRAMINE HCL 50 MG PO TABS
75.0000 mg | ORAL_TABLET | Freq: Every day | ORAL | Status: DC
  Administered 2016-11-17 – 2016-11-18 (×2): 75 mg via ORAL
  Filled 2016-11-17 (×2): qty 1

## 2016-11-17 MED ORDER — TRIAZOLAM 0.25 MG PO TABS
0.2500 mg | ORAL_TABLET | Freq: Every evening | ORAL | Status: DC | PRN
  Administered 2016-11-17 – 2016-11-18 (×2): 0.25 mg via ORAL
  Filled 2016-11-17 (×2): qty 1

## 2016-11-17 MED ORDER — ENOXAPARIN SODIUM 40 MG/0.4ML ~~LOC~~ SOLN
40.0000 mg | Freq: Every day | SUBCUTANEOUS | Status: DC
  Administered 2016-11-17 – 2016-11-18 (×2): 40 mg via SUBCUTANEOUS
  Filled 2016-11-17 (×2): qty 0.4

## 2016-11-17 MED ORDER — ONDANSETRON HCL 4 MG PO TABS
4.0000 mg | ORAL_TABLET | Freq: Four times a day (QID) | ORAL | Status: DC | PRN
  Filled 2016-11-17: qty 1

## 2016-11-17 MED ORDER — ONDANSETRON HCL 4 MG/2ML IJ SOLN: 4.0000 mg | Freq: Four times a day (QID) | INTRAMUSCULAR | Status: DC | PRN

## 2016-11-17 MED ORDER — IOPAMIDOL (ISOVUE-300) INJECTION 61%
INTRAVENOUS | Status: AC
  Filled 2016-11-17: qty 100

## 2016-11-17 MED ORDER — FERROUS SULFATE 325 (65 FE) MG PO TABS
324.0000 mg | ORAL_TABLET | ORAL | Status: DC
  Filled 2016-11-17: qty 1

## 2016-11-17 MED ORDER — POTASSIUM CHLORIDE 10 MEQ/100ML IV SOLN
10.0000 meq | INTRAVENOUS | Status: AC
  Administered 2016-11-17 (×2): 10 meq via INTRAVENOUS
  Filled 2016-11-17 (×2): qty 100

## 2016-11-17 MED ORDER — LORATADINE 10 MG PO TABS
10.0000 mg | ORAL_TABLET | Freq: Every day | ORAL | Status: DC
  Administered 2016-11-18 – 2016-11-19 (×2): 10 mg via ORAL
  Filled 2016-11-17 (×2): qty 1

## 2016-11-17 NOTE — ED Notes (Signed)
Patient denies pain and is resting comfortably.  

## 2016-11-17 NOTE — H&P (Addendum)
History and Physical    [name]:[id number] DOB: 1931-04-08 DOA: 11/17/2016  PCP: Merrilee Seashore, MD  Patient coming from: Home  Chief Complaint: abdominal pain, vomiting.   HPI: [name] is a 83 y.o. female with medical history of multiple medical problems, following with multiple specialists; briefly, h/o stroke, scleroderma on chronic prednisone, COPD, CAD, macular degeneration leading to legal blindness, HTN, GERD, HLD, DM.  She presents for 24 hours of nausea, vomiting and abdominal pain.  She reports that she went out to [employer] yesterday with a friend.  She had salmon and felt that it didn't taste very good and so didn't finish it.  About 3 hours later she developed intractable nausea and vomiting.  She has had continued vomiting all day and 1 episode of loose stool.  The emesis was composed of things she has eaten and was dark, but without blood.  The stool was normal in color, but watery.  She has attempted to eat in the ED, but even fluids make her nauseated.  She has abdominal pain which is achy and worse with pushing in her abdomen, but no swelling.  She has not had this before.  She is immunosuppressed on chronic prednisone.    ED Course: In the ED, a CT scan of the abdomen showed diffuse enteritis without obstruction.  K was 3.3, Cr was 0.7, WBC 9.3, H/H 10.2/32 with an MCV of 78 (chronic).  She received fluids and zofran with some improvement.    Review of Systems: As per HPI otherwise 10 point review of systems negative. She reports chronic SOB due to COPD.  Otherwise, negative.   Past Medical History:  Diagnosis Date  . Adenomatous colon polyp   . Arthritis   . Blood transfusion   . Bronchitis   . Cancer [facility])    cervical cancer pre hysterectomy  . Cervical disc disease   . Chronic sinusitis   . CONSTIPATION, CHRONIC 06/26/2007   Qualifier: Diagnosis of  By: Nils Pyle CMA ([place]), Mearl Latin    . COPD (chronic obstructive pulmonary disease) ([place])     . Coronary artery disease   . CVA (cerebral infarction) 1991  . Depression   . Diabetes mellitus   . Diverticulosis   . 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 Dyslipidemia   . Emphysema   . Fibromyalgia   . GERD (gastroesophageal reflux disease)   . Hypertension   . Hypothyroidism   . IBS (irritable bowel syndrome)   . Legally blind   . Macular degeneration   . Myocardial infarction [facility]) may 27th 2007  . Pneumonia 07/16/2013    HX PNEUMONIA  . Pneumonia   . Pulmonary hypertension ([place])   . Raynaud's phenomenon   . Scleroderma ([place])   . Sjogren's disease ([place])   . Sleep apnea    STOP BANG SCORE 5  . Stroke [facility]) 1990   brain stem stroke - weakness rt hand  . Zenker's diverticulum     Past Surgical History:  Procedure Laterality Date  . APPENDECTOMY    . BALLOON DILATION N/A 07/15/2015   Procedure: BALLOON DILATION;  Surgeon: Milus Banister, MD;  Location: [facility];  Service: Endoscopy;  Laterality: N/A;  . BILATERAL OOPHORECTOMY  2002  . BRONCHOSCOPY  02-22-09  . CARDIAC CATHETERIZATION     x 3  . CATARACT EXTRACTION    . CHOLECYSTECTOMY  1965  . COLON SURGERY  2014   colon resection  . colonectomy    . ESOPHAGOGASTRODUODENOSCOPY (EGD)  WITH ESOPHAGEAL DILATION N/A 06/18/2013   Procedure: ESOPHAGOGASTRODUODENOSCOPY (EGD) WITH ESOPHAGEAL DILATION;  Surgeon: Inda Castle, MD;  Location: [facility];  Service: Endoscopy;  Laterality: N/A;  . FLEXIBLE SIGMOIDOSCOPY N/A 07/15/2015   Procedure: FLEXIBLE SIGMOIDOSCOPY;  Surgeon: Milus Banister, MD;  Location: [facility];  Service: Endoscopy;  Laterality: N/A;  . lobe thyroid removal    . PROCTOSCOPY  03/18/2012   Procedure: PROCTOSCOPY;  Surgeon: Adin Hector, MD;  Location: [facility];  Service: General;  Laterality: N/A;  rigid procto  . RADIOLOGY WITH ANESTHESIA N/A 03/09/2015   Procedure: MRI CERVICAL SPINE WITHOUT AND LUMBER WITHOUT;  Surgeon: Medication Radiologist, MD;  Location: [facility];  Service: Radiology;   Laterality: N/A;  . [place]   and implantation of a plate   . STENTED CARDIAC  ARTERY  2007 / 2007 / 2010   X 3 STENT  . THYROID LOBECTOMY  1991   removal of left lobe thyroid  . TOTAL ABDOMINAL HYSTERECTOMY  1973   Reviewed with patient.   reports that she quit smoking about 28 years ago. Her smoking use included Cigarettes. She has a 40.00 pack-year smoking history. She has never used smokeless tobacco. She reports that she does not drink alcohol or use drugs.  Allergies  Allergen Reactions  . Lantus [Insulin Glargine]     Patient states "sulfate binder causes sinus infection/pneumonia".  [place] [Levofloxacin In D5w] Other (See Comments)    Muscle aches  . Maxidex [Dexamethasone] Other (See Comments)    Insomnia, anxiety, dizziness  . Metoprolol-Hydrochlorothiazide Other (See Comments)     decreased bp  . Norvasc [Amlodipine] Other (See Comments)    BIL Ankle edema  . Novolog [Insulin Aspart]     Patient states "sulfate binder causes sinus infection/pneumonia". Tolerates Humalog.  [name] Penciclovir     Other reaction(s): Muscle Pain  . Sulfonamide Derivatives Hives  . Titanium Other (See Comments)    Neck wouldn't heal with titanium cervical plate  . Adhesive [Tape] Hives and Rash   Mother died due to scarlet fever 2 years before PCN was discovered.  Family History  Problem Relation Age of Onset  . Other Mother 67       Died from sepsis  . Stroke Father 20       died  . Heart disease Father   . Diabetes Father   . Cancer Sister        lung  . Diabetes Sister   . Cancer Brother        lung  . Diabetes Brother   . Diabetes Brother     Prior to Admission medications   Medication Sig Start Date End Date Taking? Authorizing Provider  aspirin EC 81 MG tablet Take 1 tablet (81 mg total) by mouth daily. 11/18/13  Yes Fay Records, MD  cetirizine (ZYRTEC) 10 MG tablet Take 10 mg by mouth daily after breakfast.    Yes [provider]  esomeprazole  (NEXIUM) 40 MG capsule Take 40 mg by mouth daily.    Yes [provider]  ferrous sulfate 324 (65 Fe) MG TBEC Take 1 tablet (324 mg total) by mouth every other day. 09/25/16  Yes Fay Records, MD  fluticasone [facility]) 50 MCG/ACT nasal spray Place 1 spray into the nose daily as needed for allergies.  06/04/12  Yes [provider]  guaiFENesin (MUCINEX) 600 MG 12 hr tablet Take 600 mg by mouth daily as needed for to loosen  phlegm.    Yes [provider]  HYDROcodone-acetaminophen (NORCO/VICODIN) 5-325 MG tablet Take 1 tablet by mouth every 6 (six) hours as needed. Patient taking differently: Take 0.5 tablets by mouth every 6 (six) hours as needed.  06/14/16  Yes Montine Circle, PA-C  imipramine (TOFRANIL) 25 MG tablet Take 75 mg by mouth at bedtime.  05/26/09  Yes [provider]  insulin lispro (HUMALOG KWIKPEN) 100 UNIT/ML KiwkPen Inject 4-6 Units into the skin 3 (three) times daily with meals. Inject 6 units subcutaneously with breakfast and lunch, inject 5 units with supper   Yes [provider]  levothyroxine (SYNTHROID, LEVOTHROID) 100 MCG tablet Take 100 mcg by mouth daily before breakfast.   Yes [provider]  montelukast (SINGULAIR) 10 MG tablet Take 10 mg by mouth daily with breakfast. 05/10/15  Yes [provider]  predniSONE (DELTASONE) 5 MG tablet Take 1 tablet (5 mg total) by mouth daily with breakfast. 07/19/16  Yes Chesley Mires, MD  triazolam (HALCION) 0.25 MG tablet Take 0.25 mg by mouth at bedtime as needed for sleep. For sleep   Yes [provider]    Physical Exam: Vitals:   11/17/16 1445 11/17/16 1446 11/17/16 1721 11/17/16 2009  BP: 103/69  126/80 118/74  Pulse: 85  80 86  Resp: 16  18 16   Temp: 98.1 F (36.7 C)  97.1 F (36.2 C)   TempSrc: Oral  Oral   SpO2: 98%  100% 100%  Weight:  132 lb (59.9 kg)    Height:  5\' 2"  (1.575 m)      Constitutional: NAD, calm, comfortable, elderly woman.    Vitals:   11/17/16 1445 11/17/16 1446 11/17/16 1721 11/17/16 2009  BP: 103/69  126/80 118/74  Pulse: 85  80 86  Resp: 16  18 16   Temp: 98.1 F (36.7 C)  97.1 F (36.2 C)   TempSrc: Oral  Oral   SpO2: 98%  100% 100%  Weight:  132 lb (59.9 kg)    Height:  5\' 2"  (1.575 m)     Eyes: PERRL, lids and conjunctivae normal, no scleral icterus ENMT: Mucous membranes are mildly dry. Posterior pharynx clear of any exudate or lesions. Partial dentures.  Respiratory: clear to auscultation bilaterally, no wheezing, no crackles. Normal respiratory effort.  Cardiovascular: normal rate and regular rhythm, no murmurs / rubs / gallops. No extremity edema. 2+ pedal pulses.  Abdomen: Mild paucity of bowel sounds, tender to palpation diffusely, no distention or mass noted.  Musculoskeletal: no clubbing / cyanosis. She has thickening of skin of digits with skin tightening.  Skin: no rashes, lesions, ulcers noted in exposed skin.  Neurologic: Grossly intact, conversant, no slurring of speech.  Psychiatric: Normal judgment and insight. Alert and oriented x 3. Normal mood.   Labs on Admission: I have personally reviewed following labs and imaging studies  CBC:  Recent Labs Lab 11/17/16 1510  WBC 9.3  HGB 10.2*  HCT 32.6*  MCV 78.2  PLT 774   Basic Metabolic Panel:  Recent Labs Lab 11/17/16 1510  NA 135  K 3.3*  CL 100*  CO2 28  GLUCOSE 172*  BUN 21*  CREATININE 0.70  CALCIUM 8.2*   GFR: Estimated Creatinine Clearance: 42.1 mL/min (by C-G formula based on SCr of 0.7 mg/dL). Liver Function Tests:  Recent Labs Lab 11/17/16 1510  AST 19  ALT 13*  ALKPHOS 58  BILITOT 0.6  PROT 6.5  ALBUMIN 3.8    Recent Labs Lab 11/17/16 1510  LIPASE 36   No results for input(s): AMMONIA in the last 168 hours. Coagulation Profile: No results for input(s): INR, PROTIME in the last 168 hours. Cardiac Enzymes: No results for input(s): CKTOTAL, CKMB, CKMBINDEX, TROPONINI in the last 168  hours. BNP (last 3 results) No results for input(s): PROBNP in the last 8760 hours. HbA1C: No results for input(s): HGBA1C in the last 72 hours. CBG: No results for input(s): GLUCAP in the last 168 hours. Lipid Profile: No results for input(s): CHOL, HDL, LDLCALC, TRIG, CHOLHDL, LDLDIRECT in the last 72 hours. Thyroid Function Tests: No results for input(s): TSH, T4TOTAL, FREET4, T3FREE, THYROIDAB in the last 72 hours. Anemia Panel: No results for input(s): VITAMINB12, FOLATE, FERRITIN, TIBC, IRON, RETICCTPCT in the last 72 hours. Urine analysis:    Component Value Date/Time   COLORURINE YELLOW 11/17/2016 [place] 11/17/2016 1721   LABSPEC 1.011 11/17/2016 1721   PHURINE 7.0 11/17/2016 1721   GLUCOSEU NEGATIVE 11/17/2016 1721   GLUCOSEU NEGATIVE 03/24/2010 1137   HGBUR NEGATIVE 11/17/2016 1721   BILIRUBINUR NEGATIVE 11/17/2016 1721   KETONESUR NEGATIVE 11/17/2016 1721   PROTEINUR NEGATIVE 11/17/2016 1721   UROBILINOGEN 0.2 05/01/2015 2019   NITRITE NEGATIVE 11/17/2016 1721   LEUKOCYTESUR TRACE (A) 11/17/2016 1721    Radiological Exams on Admission: Ct Abdomen Pelvis W Contrast  Result Date: 11/17/2016 CLINICAL DATA:  Nausea vomiting and diarrhea EXAM: CT ABDOMEN AND PELVIS WITH CONTRAST TECHNIQUE: Multidetector CT imaging of the abdomen and pelvis was performed using the standard protocol following bolus administration of intravenous contrast. CONTRAST:  122mL ISOVUE-300 IOPAMIDOL (ISOVUE-300) INJECTION 61% COMPARISON:  05/01/2015 FINDINGS: Lower chest: Lung bases demonstrate no acute consolidation or pleural effusion. Coronary artery calcification. Nonenlarged heart. Hepatobiliary: No focal liver abnormality is seen. Status post cholecystectomy. No biliary dilatation. Pancreas: Unremarkable. No pancreatic ductal dilatation or surrounding inflammatory changes. Spleen: Normal in size without focal abnormality. Adrenals/Urinary Tract: Adrenal glands within normal  limits. Stable subcentimeter probable cyst lower pole left kidney. No hydronephrosis. Distended urinary bladder Stomach/Bowel: Stomach is nonenlarged. Fluid-filled loops of small bowel and colon throughout with prominent mucosal enhancement of multiple loops. Nonvisualization of appendix. No discrete bowel wall thickening. Vascular/Lymphatic: Aortic atherosclerosis. Few subcentimeter lymph nodes in the central mesentery. Reproductive: Status post hysterectomy. No adnexal masses. Other: No free air.  Mild haziness within the central mesenteric. Musculoskeletal: No acute or suspicious bone lesion IMPRESSION: 1. Diffuse fluid-filled nonenlarged loops of small and large bowel with prominent mucosal enhancement of multiple loops suggesting enteritis. No evidence for a bowel obstruction. No significant bowel wall thickening. 2. Mild hazy infiltration of the central mesentery consistent with nonspecific mesenteric edema or mesenteric inflammation. Electronically Signed   By: Donavan Foil M.D.   On: 11/17/2016 19:57    EKG: Independently reviewed. Twave inversions in lateral leads, appear to be partially present in last EKG, QTc 399  Assessment/Plan Enteritis - Based on history, appears to be related to acute food ingestion.   - Symptoms should dissipate in 24 - 72 hours - Supportive care with fluids, nausea medication zofran - Monitor closely for acute changes, given she is chronically immunosuppressed, would have low threshold to treat for bacterial gastroenteritis - Clears, monitor for worsening of symptoms.  - IVF with NS at 125/hr overnight, monitor for respiratory insufficiency.   Hypokalemia - K 3.3, replace IV given nausea and vomiting - Recheck in the AM    Systemic sclerosis/BOOP - These are chronic issues, she is on chronic prednisone 5mg  for this -  If acute changes of hemodynamic instability occurs, consider increasing prednisone dose to compensate for AI    Diabetes mellitus - She is  only on short acting insulin given allergy to lantus - SSI q 4 hours while not eating    Anemia of chronic disease - Baseline around 10 for Hgb, tonight 10.2 - Monitor for bleeding, trend CBC    Hypothyroidism - Continue synthroid  H/O Stroke/CAD - Currently without acute symptoms, chest pain, weakness, slurring of speech - Continue aspirin  GERD - Continue Nexium  Insomnia - Continue imipramine, triazolam     DVT prophylaxis: Lovenox  Code Status: Full, confirmed with patient.   Disposition Plan: Discharge tomorrow if improved Consults called: None Admission status: Obs, med-surg (inpatient / obs / tele / medical floor / SDU)   Gilles Chiquito MD [facility] Pager [phone]  If 7PM-7AM, please contact night-coverage [email or web address] Password [facility]  11/17/2016, 8:52 PM

## 2016-11-17 NOTE — ED Notes (Signed)
Hospitalist at bedside 

## 2016-11-17 NOTE — ED Triage Notes (Signed)
Per EMS, pt began having n/v/d yesterday at 5pm.  Pt states that she has had 8 or 9 emesis episodes and 1 bout of diarrhea. Pt is alert and oriented x 4.

## 2016-11-17 NOTE — ED Notes (Signed)
Bed: WA04 Expected date:  Expected time:  Means of arrival:  Comments: 83 yo N/V

## 2016-11-17 NOTE — ED Notes (Signed)
Patient transported to CT 

## 2016-11-17 NOTE — ED Provider Notes (Signed)
[facility] DEPT Provider Note   CSN: [id number] Arrival date & time: 11/17/16  1429     History   Chief Complaint Chief Complaint  Patient presents with  . Emesis    HPI [name] is a 83 y.o. female.  Patient is an 83 year old lady with a history of COPD, CAD, CVA, hypertension, prior colon polyp requiring resection, status post appendectomy and cholecystectomy states yesterday around 5 PM she started to develop nausea and vomiting. She has vomited numerous times usually every hour for the last 20 hours.  Right before arrival she had one episode of loose stool but has not had repetitive diarrhea. She is having some lower abdominal cramping but no localized pain. She denies any blood in her stool or vomitus. No chest pain, cough or shortness of breath. She did complain of some indigestion prior to the vomiting started.   The history is provided by the patient.  Emesis   This is a new problem. The current episode started yesterday. The problem occurs 5 to 10 times per day. The problem has not changed since onset.The emesis has an appearance of stomach contents. There has been no fever. Associated symptoms include abdominal pain, chills and diarrhea. Pertinent negatives include no cough, no myalgias and no URI. Risk factors: possible sick contact but no travel or abx.  no specific bad food contact.    Past Medical History:  Diagnosis Date  . Adenomatous colon polyp   . Arthritis   . Blood transfusion   . Bronchitis   . Cancer [facility])    cervical cancer pre hysterectomy  . Cervical disc disease   . Chronic sinusitis   . CONSTIPATION, CHRONIC 06/26/2007   Qualifier: Diagnosis of  By: Nils Pyle CMA ([place]), Mearl Latin    . COPD (chronic obstructive pulmonary disease) ([place])   . Coronary artery disease   . CVA (cerebral infarction) 1991  . Depression   . Diabetes mellitus   . Diverticulosis   . 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 Dyslipidemia   . Emphysema   . Fibromyalgia   .  GERD (gastroesophageal reflux disease)   . Hypertension   . Hypothyroidism   . IBS (irritable bowel syndrome)   . Legally blind   . Macular degeneration   . Myocardial infarction [facility]) may 27th 2007  . Pneumonia 07/16/2013    HX PNEUMONIA  . Pneumonia   . Pulmonary hypertension ([place])   . Raynaud's phenomenon   . Scleroderma ([place])   . Sjogren's disease ([place])   . Sleep apnea    STOP BANG SCORE 5  . Stroke [facility]) 1990   brain stem stroke - weakness rt hand  . Zenker's diverticulum     Patient Active Problem List   Diagnosis Date Noted  . Nondisplaced fracture of second metatarsal bone, left foot, subsequent encounter for fracture with routine healing 06/29/2016  . Arthralgia of both lower legs 03/30/2016  . Hypothyroidism 10/01/2015  . HCAP (healthcare-associated pneumonia) 05/16/2015  . Diarrhea 05/02/2015  . Diarrhea with dehydration 05/02/2015  . Hyponatremia 05/02/2015  . Weakness 01/29/2015  . Pain in joint, ankle and foot 11/20/2014  . Acute bronchitis 09/01/2014  . CAP (community acquired pneumonia) 05/26/2014  . Dysphagia, unspecified(787.20) 06/18/2013  . Stricture and stenosis of esophagus 06/18/2013  . Urinary incontinence in female 05/06/2012  . Fibromyalgia   . Chronic confusional state 03/21/2012  . Postoperative anemia 03/21/2012  . Anemia of chronic disease 03/19/2012  . Diabetes mellitus ([place]) 05/30/2011  . Colonic stricture  s/p lap sigmoid colectomy 03/18/2012 03/27/2011  . Diverticulosis of colon 03/27/2011  . Systemic sclerosis ([place]) 06/29/2009  . Pure hypercholesterolemia 05/25/2009  . BOOP (bronchiolitis obliterans with organizing pneumonia) ([place]) 02/18/2009  . Chronic rhinitis 07/08/2007  . EMPHYSEMA 07/08/2007  . Hyperlipidemia 06/26/2007  . Depression 06/26/2007  . Legal blindness 06/26/2007  . Essential hypertension 06/26/2007  . CORONARY ARTERY DISEASE 06/26/2007  . ZENKER'S DIVERTICULUM 06/26/2007  . Irritable bowel syndrome 06/26/2007     Past Surgical History:  Procedure Laterality Date  . APPENDECTOMY    . BALLOON DILATION N/A 07/15/2015   Procedure: BALLOON DILATION;  Surgeon: Milus Banister, MD;  Location: [facility];  Service: Endoscopy;  Laterality: N/A;  . BILATERAL OOPHORECTOMY  2002  . BRONCHOSCOPY  02-22-09  . CARDIAC CATHETERIZATION     x 3  . CATARACT EXTRACTION    . CHOLECYSTECTOMY  1965  . COLON SURGERY  2014   colon resection  . colonectomy    . ESOPHAGOGASTRODUODENOSCOPY (EGD) WITH ESOPHAGEAL DILATION N/A 06/18/2013   Procedure: ESOPHAGOGASTRODUODENOSCOPY (EGD) WITH ESOPHAGEAL DILATION;  Surgeon: Inda Castle, MD;  Location: [facility];  Service: Endoscopy;  Laterality: N/A;  . FLEXIBLE SIGMOIDOSCOPY N/A 07/15/2015   Procedure: FLEXIBLE SIGMOIDOSCOPY;  Surgeon: Milus Banister, MD;  Location: [facility];  Service: Endoscopy;  Laterality: N/A;  . lobe thyroid removal    . PROCTOSCOPY  03/18/2012   Procedure: PROCTOSCOPY;  Surgeon: Adin Hector, MD;  Location: [facility];  Service: General;  Laterality: N/A;  rigid procto  . RADIOLOGY WITH ANESTHESIA N/A 03/09/2015   Procedure: MRI CERVICAL SPINE WITHOUT AND LUMBER WITHOUT;  Surgeon: Medication Radiologist, MD;  Location: [place];  Service: Radiology;  Laterality: N/A;  . [place]   and implantation of a plate   . STENTED CARDIAC  ARTERY  2007 / 2007 / 2010   X 3 STENT  . THYROID LOBECTOMY  1991   removal of left lobe thyroid  . TOTAL ABDOMINAL HYSTERECTOMY  1973    OB History    No data available       Home Medications    Prior to Admission medications   Medication Sig Start Date End Date Taking? Authorizing Provider  aspirin EC 81 MG tablet Take 1 tablet (81 mg total) by mouth daily. 11/18/13  Yes Fay Records, MD  cetirizine (ZYRTEC) 10 MG tablet Take 10 mg by mouth daily after breakfast.    Yes [provider]  esomeprazole (NEXIUM) 40 MG capsule Take 40 mg by mouth daily.    Yes [provider]   ferrous sulfate 324 (65 Fe) MG TBEC Take 1 tablet (324 mg total) by mouth every other day. 09/25/16  Yes Fay Records, MD  fluticasone [facility]) 50 MCG/ACT nasal spray Place 1 spray into the nose daily as needed for allergies.  06/04/12  Yes [provider]  guaiFENesin (MUCINEX) 600 MG 12 hr tablet Take 600 mg by mouth daily as needed for to loosen phlegm.    Yes [provider]  HYDROcodone-acetaminophen (NORCO/VICODIN) 5-325 MG tablet Take 1 tablet by mouth every 6 (six) hours as needed. Patient taking differently: Take 0.5 tablets by mouth every 6 (six) hours as needed.  06/14/16  Yes Montine Circle, PA-C  imipramine (TOFRANIL) 25 MG tablet Take 75 mg by mouth at bedtime.  05/26/09  Yes [provider]  insulin lispro (HUMALOG KWIKPEN) 100 UNIT/ML KiwkPen Inject 4-6 Units into the skin 3 (three) times daily with  meals. Inject 6 units subcutaneously with breakfast and lunch, inject 5 units with supper   Yes [provider]  levothyroxine (SYNTHROID, LEVOTHROID) 100 MCG tablet Take 100 mcg by mouth daily before breakfast.   Yes [provider]  montelukast (SINGULAIR) 10 MG tablet Take 10 mg by mouth daily with breakfast. 05/10/15  Yes [provider]  predniSONE (DELTASONE) 5 MG tablet Take 1 tablet (5 mg total) by mouth daily with breakfast. 07/19/16  Yes Chesley Mires, MD  triazolam (HALCION) 0.25 MG tablet Take 0.25 mg by mouth at bedtime as needed for sleep. For sleep   Yes [provider]    Family History Family History  Problem Relation Age of Onset  . Other Mother 64       Died from sepsis  . Stroke Father 1       died  . Heart disease Father   . Diabetes Father   . Cancer Sister        lung  . Diabetes Sister   . Cancer Brother        lung  . Diabetes Brother   . Diabetes Brother     Social History Social History  Substance Use Topics  . Smoking status: Former Smoker    Packs/day: 1.00    Years: 40.00     Types: Cigarettes    Quit date: 06/26/1988  . Smokeless tobacco: Never Used     Comment: 40 pack years  . Alcohol use No     Allergies   Lantus [insulin glargine]; Levaquin [levofloxacin in d5w]; Maxidex [dexamethasone]; Metoprolol-hydrochlorothiazide; Norvasc [amlodipine]; Novolog [insulin aspart]; Penciclovir; Sulfonamide derivatives; Titanium; and Adhesive [tape]   Review of Systems Review of Systems  Constitutional: Positive for chills.  Respiratory: Negative for cough.   Gastrointestinal: Positive for abdominal pain, diarrhea and vomiting.  Musculoskeletal: Negative for myalgias.  All other systems reviewed and are negative.    Physical Exam Updated Vital Signs BP 103/69 (BP Location: Right Arm)   Pulse 85   Temp 98.1 F (36.7 C) (Oral)   Resp 16   Ht 5\' 2"  (1.575 m)   Wt 59.9 kg (132 lb)   SpO2 98%   BMI 24.14 kg/m   Physical Exam  Constitutional: She is oriented to person, place, and time. She appears well-developed and well-nourished. No distress.  HENT:  Head: Normocephalic and atraumatic.  Mouth/Throat: Oropharynx is clear and moist. Mucous membranes are dry.  Eyes: Conjunctivae and EOM are normal. Pupils are equal, round, and reactive to light.  Neck: Normal range of motion. Neck supple.  Cardiovascular: Normal rate, regular rhythm and intact distal pulses.   No murmur heard. Pulmonary/Chest: Effort normal and breath sounds normal. No respiratory distress. She has no wheezes. She has no rales.  Abdominal: Soft. She exhibits no distension. There is tenderness in the suprapubic area. There is no rebound, no guarding and no CVA tenderness.  Musculoskeletal: Normal range of motion. She exhibits no edema or tenderness.  Neurological: She is alert and oriented to person, place, and time.  Skin: Skin is warm and dry. No rash noted. No erythema.  Psychiatric: She has a normal mood and affect. Her behavior is normal.  Nursing note and vitals reviewed.    ED  Treatments / Results  Labs (all labs ordered are listed, but only abnormal results are displayed) Labs Reviewed  COMPREHENSIVE METABOLIC PANEL - Abnormal; Notable for the following:       Result Value   Potassium 3.3 (*)  Chloride 100 (*)    Glucose, Bld 172 (*)    BUN 21 (*)    Calcium 8.2 (*)    ALT 13 (*)    All other components within normal limits  CBC - Abnormal; Notable for the following:    Hemoglobin 10.2 (*)    HCT 32.6 (*)    MCH 24.5 (*)    All other components within normal limits  URINALYSIS, ROUTINE W REFLEX MICROSCOPIC - Abnormal; Notable for the following:    Leukocytes, UA TRACE (*)    Bacteria, UA RARE (*)    Squamous Epithelial / LPF 0-5 (*)    All other components within normal limits  LIPASE, BLOOD    EKG  EKG Interpretation  Date/Time:  Friday Nov 17 2016 16:49:24 EDT Ventricular Rate:  82 PR Interval:    QRS Duration: 73 QT Interval:  341 QTC Calculation: 399 R Axis:   62 Text Interpretation:  Sinus rhythm Borderline repolarization abnormality No significant change since last tracing Confirmed by Blanchie Dessert [phone]) on 11/17/2016 5:17:16 PM      ED ECG REPORT   Date: 11/17/2016  Rate: 82  Rhythm: normal sinus rhythm  QRS Axis: normal  Intervals: normal  ST/T Wave abnormalities: nonspecific T wave changes  Conduction Disutrbances:none  Narrative Interpretation:   Old EKG Reviewed: none available  I have personally reviewed the EKG tracing and agree with the computerized printout as noted.  Radiology Ct Abdomen Pelvis W Contrast  Result Date: 11/17/2016 CLINICAL DATA:  Nausea vomiting and diarrhea EXAM: CT ABDOMEN AND PELVIS WITH CONTRAST TECHNIQUE: Multidetector CT imaging of the abdomen and pelvis was performed using the standard protocol following bolus administration of intravenous contrast. CONTRAST:  151mL ISOVUE-300 IOPAMIDOL (ISOVUE-300) INJECTION 61% COMPARISON:  05/01/2015 FINDINGS: Lower chest: Lung bases demonstrate no  acute consolidation or pleural effusion. Coronary artery calcification. Nonenlarged heart. Hepatobiliary: No focal liver abnormality is seen. Status post cholecystectomy. No biliary dilatation. Pancreas: Unremarkable. No pancreatic ductal dilatation or surrounding inflammatory changes. Spleen: Normal in size without focal abnormality. Adrenals/Urinary Tract: Adrenal glands within normal limits. Stable subcentimeter probable cyst lower pole left kidney. No hydronephrosis. Distended urinary bladder Stomach/Bowel: Stomach is nonenlarged. Fluid-filled loops of small bowel and colon throughout with prominent mucosal enhancement of multiple loops. Nonvisualization of appendix. No discrete bowel wall thickening. Vascular/Lymphatic: Aortic atherosclerosis. Few subcentimeter lymph nodes in the central mesentery. Reproductive: Status post hysterectomy. No adnexal masses. Other: No free air.  Mild haziness within the central mesenteric. Musculoskeletal: No acute or suspicious bone lesion IMPRESSION: 1. Diffuse fluid-filled nonenlarged loops of small and large bowel with prominent mucosal enhancement of multiple loops suggesting enteritis. No evidence for a bowel obstruction. No significant bowel wall thickening. 2. Mild hazy infiltration of the central mesentery consistent with nonspecific mesenteric edema or mesenteric inflammation. Electronically Signed   By: Donavan Foil M.D.   On: 11/17/2016 19:57    Procedures Procedures (including critical care time)  Medications Ordered in ED Medications  ondansetron (ZOFRAN) injection 4 mg (not administered)  ondansetron (ZOFRAN) injection 4 mg (0 mg Intravenous Hold 11/17/16 1531)  sodium chloride 0.9 % bolus 1,000 mL (1,000 mLs Intravenous New Bag/Given 11/17/16 1529)     Initial Impression / Assessment and Plan / ED Course  I have reviewed the triage vital signs and the nursing notes.  Pertinent labs & imaging results that were available during my care of the patient  were reviewed by me and considered in my medical decision making (see chart for  details).    Patient is an elderly female presenting with nausea, vomiting and diarrhea. Nausea and vomiting has been ongoing for the last 20 hours then 1 small episode of loose stool before arrival. Patient is having lower abdominal cramping and does complain of some mild difficulty getting a urine stream started but denies any dysuria frequency or urgency. Patient has had multiple abdominal surgeries so possible that she has bowel obstruction however she did have a bowel movement. Also possible that this is a viral etiology, patient has had her gallbladder and appendix removed. Low suspicion for respiratory cardiac cause of her symptoms.  Possible UTI however lower suspicion given sudden nature of symptoms. Not classic for kidney stone. Patient is currently hemodynamically stable but appears dehydrated.  CBC, CMP, lipase, UA, EKG pending. Patient given IV fluids and Zofran  5:13 PM Labs without significant findings. EKG is unchanged. UA still pending. On reevaluation after Zofran and 1 L fluid patient is feeling much better. She was given by mouth challenge.  6:35 PM On reevaluation patient's urine is within normal limits however after she had some ginger ale to drink she states her abdomen started hurting worse. Will do a CT to rule out underlying pathology.  8:14 PM CT showing diffuse enteritis without small bowel extraction. On reevaluation patient still having significant abdominal pain and nausea when attempting anything po.  Will admit for ongoing hydration and continued treatment of symptoms.  Final Clinical Impressions(s) / ED Diagnoses   Final diagnoses:  Dehydration  Enteritis    New Prescriptions New Prescriptions   No medications on file     Blanchie Dessert, MD 11/17/16 2015

## 2016-11-18 DIAGNOSIS — Z7982 Long term (current) use of aspirin: Secondary | ICD-10-CM | POA: Diagnosis not present

## 2016-11-18 DIAGNOSIS — M349 Systemic sclerosis, unspecified: Secondary | ICD-10-CM | POA: Diagnosis present

## 2016-11-18 DIAGNOSIS — Z8673 Personal history of transient ischemic attack (TIA), and cerebral infarction without residual deficits: Secondary | ICD-10-CM | POA: Diagnosis not present

## 2016-11-18 DIAGNOSIS — K529 Noninfective gastroenteritis and colitis, unspecified: Principal | ICD-10-CM

## 2016-11-18 DIAGNOSIS — E119 Type 2 diabetes mellitus without complications: Secondary | ICD-10-CM | POA: Diagnosis present

## 2016-11-18 DIAGNOSIS — E876 Hypokalemia: Secondary | ICD-10-CM | POA: Diagnosis present

## 2016-11-18 DIAGNOSIS — E860 Dehydration: Secondary | ICD-10-CM

## 2016-11-18 DIAGNOSIS — I2510 Atherosclerotic heart disease of native coronary artery without angina pectoris: Secondary | ICD-10-CM | POA: Diagnosis present

## 2016-11-18 DIAGNOSIS — Z7952 Long term (current) use of systemic steroids: Secondary | ICD-10-CM | POA: Diagnosis not present

## 2016-11-18 DIAGNOSIS — Z888 Allergy status to other drugs, medicaments and biological substances status: Secondary | ICD-10-CM | POA: Diagnosis not present

## 2016-11-18 DIAGNOSIS — K219 Gastro-esophageal reflux disease without esophagitis: Secondary | ICD-10-CM | POA: Diagnosis present

## 2016-11-18 DIAGNOSIS — I252 Old myocardial infarction: Secondary | ICD-10-CM | POA: Diagnosis not present

## 2016-11-18 DIAGNOSIS — H3530 Unspecified macular degeneration: Secondary | ICD-10-CM | POA: Diagnosis present

## 2016-11-18 DIAGNOSIS — Z8541 Personal history of malignant neoplasm of cervix uteri: Secondary | ICD-10-CM | POA: Diagnosis not present

## 2016-11-18 DIAGNOSIS — Z794 Long term (current) use of insulin: Secondary | ICD-10-CM | POA: Diagnosis not present

## 2016-11-18 DIAGNOSIS — E039 Hypothyroidism, unspecified: Secondary | ICD-10-CM | POA: Diagnosis present

## 2016-11-18 DIAGNOSIS — Z90710 Acquired absence of both cervix and uterus: Secondary | ICD-10-CM | POA: Diagnosis not present

## 2016-11-18 LAB — GLUCOSE, CAPILLARY
Glucose-Capillary: 116 mg/dL — ABNORMAL HIGH (ref 65–99)
Glucose-Capillary: 118 mg/dL — ABNORMAL HIGH (ref 65–99)
Glucose-Capillary: 125 mg/dL — ABNORMAL HIGH (ref 65–99)
Glucose-Capillary: 126 mg/dL — ABNORMAL HIGH (ref 65–99)
Glucose-Capillary: 127 mg/dL — ABNORMAL HIGH (ref 65–99)
Glucose-Capillary: 137 mg/dL — ABNORMAL HIGH (ref 65–99)
Glucose-Capillary: 161 mg/dL — ABNORMAL HIGH (ref 65–99)

## 2016-11-18 LAB — BASIC METABOLIC PANEL
Anion gap: 8 (ref 5–15)
BUN: 12 mg/dL (ref 6–20)
CO2: 24 mmol/L (ref 22–32)
Calcium: 7.8 mg/dL — ABNORMAL LOW (ref 8.9–10.3)
Chloride: 104 mmol/L (ref 101–111)
Creatinine, Ser: 0.62 mg/dL (ref 0.44–1.00)
GFR calc Af Amer: >60 mL/min (ref >60)
GFR calc non Af Amer: >60 mL/min (ref >60)
Glucose, Bld: 118 mg/dL — ABNORMAL HIGH (ref 65–99)
Potassium: 3.5 mmol/L (ref 3.5–5.1)
Sodium: 136 mmol/L (ref 135–145)

## 2016-11-18 LAB — CBC
HCT: 30.4 % — ABNORMAL LOW (ref 36.0–46.0)
Hemoglobin: 9.4 g/dL — ABNORMAL LOW (ref 12.0–15.0)
MCH: 24.4 pg — ABNORMAL LOW (ref 26.0–34.0)
MCHC: 30.9 g/dL (ref 30.0–36.0)
MCV: 78.8 fL (ref 78.0–100.0)
Platelets: 196 10*3/uL (ref 150–400)
RBC: 3.86 MIL/uL — ABNORMAL LOW (ref 3.87–5.11)
RDW: 14.5 % (ref 11.5–15.5)
WBC: 7.6 10*3/uL (ref 4.0–10.5)

## 2016-11-18 MED ORDER — ALUM & MAG HYDROXIDE-SIMETH 200-200-20 MG/5ML PO SUSP
15.0000 mL | ORAL | Status: DC | PRN
  Administered 2016-11-18: 15 mL via ORAL
  Filled 2016-11-18: qty 30

## 2016-11-18 MED ORDER — ALUM HYDROXIDE-MAG CARBONATE 95-358 MG/15ML PO SUSP: 15.0000 mL | ORAL | Status: DC | PRN

## 2016-11-18 MED ORDER — FERROUS SULFATE 325 (65 FE) MG PO TABS
325.0000 mg | ORAL_TABLET | ORAL | Status: DC
  Administered 2016-11-18: 325 mg via ORAL

## 2016-11-18 MED ORDER — INSULIN ASPART 100 UNIT/ML ~~LOC~~ SOLN: 0.0000 [IU] | Freq: Every day | SUBCUTANEOUS | Status: DC

## 2016-11-18 MED ORDER — INSULIN ASPART 100 UNIT/ML ~~LOC~~ SOLN: 0.0000 [IU] | Freq: Three times a day (TID) | SUBCUTANEOUS | Status: DC

## 2016-11-18 NOTE — Progress Notes (Signed)
Report called and given to [name] on [place]. All nurse's questions answered to her satisfaction. Hale Bogus.

## 2016-11-18 NOTE — Progress Notes (Signed)
Pt refusing to take Novolog, reports she is sensitive to it and takes Humalog at home. Notified provider on call. He advised me to call pharmacy to check if he had Humalog. Pharmacy said we don't carry it. Reported this to pt, she still refusing to take Novolog.  Will continue to monitor.

## 2016-11-18 NOTE — Progress Notes (Signed)
Attempted calling report; but nurse was in a sterile procedure. Left number for nurse to call back. Hale Bogus.

## 2016-11-18 NOTE — Progress Notes (Signed)
Patient ID: [name], female   DOB: 04-Apr-1931, 83 y.o.   MRN: [id number]    PROGRESS NOTE    [name]  WUJ:[id number] DOB: [birth year] DOA: 11/17/2016  PCP: Merrilee Seashore, MD   Brief Narrative:  Pt is 83 yo female with known HTN, HLD, COPD, DM, scleroderma on chronic prednisone, presented to [facility] ED for evaluation of one day duration of progressively worsening nausea and non bloody vomiting, diarrhea, watery and associated with abd cramps, fecal incontinence.   Assessment & Plan:   Active Problems:   Abd pain with N/V/D - suspect related to enteritis - pt did eat outside prior to the event so food poisoning is certainly possible - will also check stool panel, C. Diff by PCR - pt still with poor oral intake, needs more IVF as still clinically dry  - hold off on ABX for now until stool panel and C. Diff results back     Systemic sclerosis (HCC) - continue regimen with home Prednisone     Diabetes mellitus ([place]) - still poor oral intake - keep on SSI for now    Anemia of chronic disease, scleroderma - slight drop in Hg from likely IVF - no signs of active bleeding - CBC in AM    Hypothyroidism - continue synthroid     Hypokalemia - mild, from diarrhea - give one dose of K-dur - BMP in AM  DVT prophylaxis: Lovenox SQ Code Status: Full  Family Communication: Patient at bedside  Disposition Plan: home when medically stable   Consultants:   None   Procedures:   None  Antimicrobials:   None  Subjective: Pt reports persistent diarrhea.   Objective: Vitals:   11/17/16 2009 11/17/16 2100 11/17/16 2213 11/18/16 0425  BP: 118/74 (!) 133/36 (!) 150/52 (!) 133/58  Pulse: 86 89 89 88  Resp: 16 16 15 16   Temp:   99.3 F (37.4 C) 98.8 F (37.1 C)  TempSrc:   Oral Oral  SpO2: 100% 99% 100% 93%  Weight:      Height:        Intake/Output Summary (Last 24 hours) at 11/18/16 1245 Last data filed at 11/18/16 0900  Gross per 24 hour  Intake              1480 ml  Output                0 ml  Net             1480 ml   Filed Weights   11/17/16 1446  Weight: 59.9 kg (132 lb)    Examination:  General exam: Appears calm and comfortable, dry mucous membranes  Respiratory system: Respiratory effort normal. Cardiovascular system: S1 & S2 heard, RRR. No rubs, gallops or clicks. No pedal edema. Gastrointestinal system: Abdomen is nondistended, soft, hyperactive bowel sounds   Extremities: Symmetric 5 x 5 power. Skin: No rashes, lesions or ulcers Psychiatry: Judgement and insight appear normal. Mood & affect appropriate.   Data Reviewed: I have personally reviewed following labs and imaging studies  CBC:  Recent Labs Lab 11/17/16 1510 11/18/16 0309  WBC 9.3 7.6  HGB 10.2* 9.4*  HCT 32.6* 30.4*  MCV 78.2 78.8  PLT 233 956   Basic Metabolic Panel:  Recent Labs Lab 11/17/16 1510 11/18/16 0309  NA 135 136  K 3.3* 3.5  CL 100* 104  CO2 28 24  GLUCOSE 172* 118*  BUN 21* 12  CREATININE 0.70 0.62  CALCIUM  8.2* 7.8*   Liver Function Tests:  Recent Labs Lab 11/17/16 1510  AST 19  ALT 13*  ALKPHOS 58  BILITOT 0.6  PROT 6.5  ALBUMIN 3.8    Recent Labs Lab 11/17/16 1510  LIPASE 36   CBG:  Recent Labs Lab 11/18/16 0054 11/18/16 0421 11/18/16 0737 11/18/16 1205  GLUCAP 116* 118* 126* 125*   Urine analysis:    Component Value Date/Time   COLORURINE YELLOW 11/17/2016 [place] 11/17/2016 1721   LABSPEC 1.011 11/17/2016 1721   PHURINE 7.0 11/17/2016 1721   GLUCOSEU NEGATIVE 11/17/2016 1721   GLUCOSEU NEGATIVE 03/24/2010 1137   HGBUR NEGATIVE 11/17/2016 1721   BILIRUBINUR NEGATIVE 11/17/2016 1721   KETONESUR NEGATIVE 11/17/2016 1721   PROTEINUR NEGATIVE 11/17/2016 1721   UROBILINOGEN 0.2 05/01/2015 2019   NITRITE NEGATIVE 11/17/2016 1721   LEUKOCYTESUR TRACE (A) 11/17/2016 1721   Radiology Studies: Ct Abdomen Pelvis W Contrast  Result Date: 11/17/2016 CLINICAL DATA:  Nausea  vomiting and diarrhea EXAM: CT ABDOMEN AND PELVIS WITH CONTRAST TECHNIQUE: Multidetector CT imaging of the abdomen and pelvis was performed using the standard protocol following bolus administration of intravenous contrast. CONTRAST:  122mL ISOVUE-300 IOPAMIDOL (ISOVUE-300) INJECTION 61% COMPARISON:  05/01/2015 FINDINGS: Lower chest: Lung bases demonstrate no acute consolidation or pleural effusion. Coronary artery calcification. Nonenlarged heart. Hepatobiliary: No focal liver abnormality is seen. Status post cholecystectomy. No biliary dilatation. Pancreas: Unremarkable. No pancreatic ductal dilatation or surrounding inflammatory changes. Spleen: Normal in size without focal abnormality. Adrenals/Urinary Tract: Adrenal glands within normal limits. Stable subcentimeter probable cyst lower pole left kidney. No hydronephrosis. Distended urinary bladder Stomach/Bowel: Stomach is nonenlarged. Fluid-filled loops of small bowel and colon throughout with prominent mucosal enhancement of multiple loops. Nonvisualization of appendix. No discrete bowel wall thickening. Vascular/Lymphatic: Aortic atherosclerosis. Few subcentimeter lymph nodes in the central mesentery. Reproductive: Status post hysterectomy. No adnexal masses. Other: No free air.  Mild haziness within the central mesenteric. Musculoskeletal: No acute or suspicious bone lesion IMPRESSION: 1. Diffuse fluid-filled nonenlarged loops of small and large bowel with prominent mucosal enhancement of multiple loops suggesting enteritis. No evidence for a bowel obstruction. No significant bowel wall thickening. 2. Mild hazy infiltration of the central mesentery consistent with nonspecific mesenteric edema or mesenteric inflammation. Electronically Signed   By: Donavan Foil M.D.   On: 11/17/2016 19:57   Scheduled Meds: . aspirin EC  81 mg Oral Daily  . enoxaparin (LOVENOX) injection  40 mg Subcutaneous QHS  . ferrous sulfate  325 mg Oral QODAY  . imipramine  75 mg  Oral QHS  . insulin aspart  0-9 Units Subcutaneous Q4H  . levothyroxine  100 mcg Oral QAC breakfast  . loratadine  10 mg Oral Daily  . montelukast  10 mg Oral Q breakfast  . pantoprazole  40 mg Oral Daily  . predniSONE  5 mg Oral Q breakfast   Continuous Infusions: . sodium chloride 100 mL/hr at 11/18/16 1001    LOS: 0 days   Time spent: 20 minutes   Faye Ramsay, MD [facility] Pager [phone]  If 7PM-7AM, please contact night-coverage [email or web address] Password [email or web address] 11/18/2016, 12:45 PM

## 2016-11-18 NOTE — Progress Notes (Signed)
Pt transferred to room 1329. Left unit in wheelchair pushed by nurse tech from [place]. Left in good condition. Hale Bogus.

## 2016-11-19 DIAGNOSIS — E860 Dehydration: Secondary | ICD-10-CM

## 2016-11-19 LAB — BASIC METABOLIC PANEL
Anion gap: 6 (ref 5–15)
BUN: 8 mg/dL (ref 6–20)
CO2: 26 mmol/L (ref 22–32)
Calcium: 7.7 mg/dL — ABNORMAL LOW (ref 8.9–10.3)
Chloride: 105 mmol/L (ref 101–111)
Creatinine, Ser: 0.67 mg/dL (ref 0.44–1.00)
GFR calc Af Amer: >60 mL/min (ref >60)
GFR calc non Af Amer: >60 mL/min (ref >60)
Glucose, Bld: 142 mg/dL — ABNORMAL HIGH (ref 65–99)
Potassium: 3.2 mmol/L — ABNORMAL LOW (ref 3.5–5.1)
Sodium: 137 mmol/L (ref 135–145)

## 2016-11-19 LAB — CBC
HCT: 26.7 % — ABNORMAL LOW (ref 36.0–46.0)
Hemoglobin: 8.1 g/dL — ABNORMAL LOW (ref 12.0–15.0)
MCH: 24 pg — ABNORMAL LOW (ref 26.0–34.0)
MCHC: 30.3 g/dL (ref 30.0–36.0)
MCV: 79.2 fL (ref 78.0–100.0)
Platelets: 190 10*3/uL (ref 150–400)
RBC: 3.37 MIL/uL — ABNORMAL LOW (ref 3.87–5.11)
RDW: 14.4 % (ref 11.5–15.5)
WBC: 5.6 10*3/uL (ref 4.0–10.5)

## 2016-11-19 LAB — C DIFFICILE QUICK SCREEN W PCR REFLEX
C Diff antigen: NEGATIVE
C Diff interpretation: NOT DETECTED
C Diff toxin: NEGATIVE

## 2016-11-19 LAB — GLUCOSE, CAPILLARY: Glucose-Capillary: 116 mg/dL — ABNORMAL HIGH (ref 65–99)

## 2016-11-19 MED ORDER — POTASSIUM CHLORIDE CRYS ER 20 MEQ PO TBCR
40.0000 meq | EXTENDED_RELEASE_TABLET | Freq: Once | ORAL | Status: AC
  Administered 2016-11-19: 40 meq via ORAL
  Filled 2016-11-19: qty 2

## 2016-11-19 MED ORDER — POTASSIUM CHLORIDE CRYS ER 20 MEQ PO TBCR: 20.0000 meq | EXTENDED_RELEASE_TABLET | Freq: Every day | ORAL | 0 refills | Status: DC

## 2016-11-19 NOTE — Progress Notes (Signed)
Pt asked why she still needed the IV fluid since she was eating and drinking well. She verbalized that she did not want it anymore. I notified provider on call and informed him that pt did not want any more fluid.  Provider did not d/c the fluid order.  Per pt request, I took her IV fluid down but left her SL.

## 2016-11-19 NOTE — Progress Notes (Signed)
Discharge instructions explained to pt and daughter, prescription given for KCL.. IV removed and gauze dressing applied. Pt discharged via wheelchair. Pt requested discharge, stating she felt better and her grandson's birthday was today.

## 2016-11-19 NOTE — Discharge Summary (Signed)
Physician Discharge Summary  [name] RSW:[id number] DOB: [birth year] DOA: 11/17/2016  PCP: Renee Seashore, MD  Admit date: 11/17/2016 Discharge date: 11/19/2016  Recommendations for Outpatient Follow-up:  1. Pt will need to follow up with PCP in 1 week post discharge 2. Please obtain BMP to evaluate electrolytes and kidney function, K level  3. Please also check CBC to evaluate Hg and Hct levels 4. Please note that pt reports feeling better with no further diarrheas, wants to go home today as her grandson's birthday is today, she as advised to call me back within 24 hours to report any potential reoccurrence of diarrhea   Discharge Diagnoses:  Active Problems:   Systemic sclerosis ([place])   Diabetes mellitus ([place])   Anemia of chronic disease   Hypothyroidism   Enteritis  Discharge Condition: Stable  Diet recommendation: Heart healthy diet discussed in details   Brief Narrative:  Pt is 83 yo [name] with known HTN, HLD, COPD, DM, scleroderma on chronic prednisone, presented to [facility] ED for evaluation of one day duration of progressively worsening nausea and non bloody vomiting, diarrhea, watery and associated with abd cramps, fecal incontinence.   Assessment & Plan:   Active Problems:   Abd pain with N/V/D - suspect related to enteritis - C. Diff negative, GI panel negative - pt wants to go home, family party is today - pt denies diarrhea and eating regular diet     Systemic sclerosis ([place]) - continue regimen with home Prednisone     Diabetes mellitus ([place]) - continue with home medical regimen     Anemia of chronic disease, scleroderma - no signs of active bleeding - CBC in AM    Hypothyroidism - continue synthroid     Hypokalemia - supplemented, pt wants to go home and was advised to make sure she has blood work done in next week to follow up on K level   DVT prophylaxis: Lovenox SQ Code Status: Full  Family Communication: Patient at bedside   Disposition Plan: home   Consultants:   None   Procedures:   None  Antimicrobials:   None  Procedures/Studies: Ct Abdomen Pelvis W Contrast  Result Date: 11/17/2016 CLINICAL DATA:  Nausea vomiting and diarrhea EXAM: CT ABDOMEN AND PELVIS WITH CONTRAST TECHNIQUE: Multidetector CT imaging of the abdomen and pelvis was performed using the standard protocol following bolus administration of intravenous contrast. CONTRAST:  13mL ISOVUE-300 IOPAMIDOL (ISOVUE-300) INJECTION 61% COMPARISON:  05/01/2015 FINDINGS: Lower chest: Lung bases demonstrate no acute consolidation or pleural effusion. Coronary artery calcification. Nonenlarged heart. Hepatobiliary: No focal liver abnormality is seen. Status post cholecystectomy. No biliary dilatation. Pancreas: Unremarkable. No pancreatic ductal dilatation or surrounding inflammatory changes. Spleen: Normal in size without focal abnormality. Adrenals/Urinary Tract: Adrenal glands within normal limits. Stable subcentimeter probable cyst lower pole left kidney. No hydronephrosis. Distended urinary bladder Stomach/Bowel: Stomach is nonenlarged. Fluid-filled loops of small bowel and colon throughout with prominent mucosal enhancement of multiple loops. Nonvisualization of appendix. No discrete bowel wall thickening. Vascular/Lymphatic: Aortic atherosclerosis. Few subcentimeter lymph nodes in the central mesentery. Reproductive: Status post hysterectomy. No adnexal masses. Other: No free air.  Mild haziness within the central mesenteric. Musculoskeletal: No acute or suspicious bone lesion IMPRESSION: 1. Diffuse fluid-filled nonenlarged loops of small and large bowel with prominent mucosal enhancement of multiple loops suggesting enteritis. No evidence for a bowel obstruction. No significant bowel wall thickening. 2. Mild hazy infiltration of the central mesentery consistent with nonspecific mesenteric edema or mesenteric inflammation. Electronically Signed  By: Donavan Foil M.D.   On: 11/17/2016 19:57      Discharge Exam: Vitals:   11/18/16 2108 11/19/16 0400  BP: 137/64 (!) 139/93  Pulse: 73 87  Resp: 16 16  Temp: 99.3 F (37.4 C) 98 F (Renee.7 C)   Vitals:   11/18/16 1345 11/18/16 1602 11/18/16 2108 11/19/16 0400  BP: (!) 122/53 (!) 122/54 137/64 (!) 139/93  Pulse: 82 74 73 87  Resp: 18 18 16 16   Temp: 99.3 F (37.4 C) 98 F (Renee.7 C) 99.3 F (37.4 C) 98 F (Renee.7 C)  TempSrc: Oral Oral Oral Oral  SpO2: 96% 100% 99% 97%  Weight:  59 kg (130 lb)    Height:  5\' 2"  (1.575 m)      General: Pt is alert, follows commands appropriately, not in acute distress Cardiovascular: Regular rate and rhythm, S1/S2 +, no rubs, no gallops Respiratory: Clear to auscultation bilaterally, no wheezing, no crackles, no rhonchi Abdominal: Soft, non tender, non distended, bowel sounds +, no guarding  Discharge Instructions  Discharge Instructions    Diet - low sodium heart healthy    Complete by:  As directed    Increase activity slowly    Complete by:  As directed      Allergies as of 11/19/2016      Reactions   Lantus [insulin Glargine]    Patient states "sulfate binder causes sinus infection/pneumonia".   Levaquin [levofloxacin In D5w] Other (See Comments)   Muscle aches   Maxidex [dexamethasone] Other (See Comments)   Insomnia, anxiety, dizziness   Metoprolol-hydrochlorothiazide Other (See Comments)    decreased bp   Norvasc [amlodipine] Other (See Comments)   BIL Ankle edema   Novolog [insulin Aspart]    Patient states "sulfate binder causes sinus infection/pneumonia". Tolerates Humalog.   Penciclovir    Other reaction(s): Muscle Pain   Sulfonamide Derivatives Hives   Titanium Other (See Comments)   Neck wouldn't heal with titanium cervical plate   Adhesive [tape] Hives, Rash      Medication List    TAKE these medications   aspirin EC 81 MG tablet Take 1 tablet (81 mg total) by mouth daily.   cetirizine 10 MG tablet Commonly  known as:  ZYRTEC Take 10 mg by mouth daily after breakfast.   esomeprazole 40 MG capsule Commonly known as:  NEXIUM Take 40 mg by mouth daily.   ferrous sulfate 324 (65 Fe) MG Tbec Take 1 tablet (324 mg total) by mouth every other day.   fluticasone 50 MCG/ACT nasal spray Commonly known as:  FLONASE Place 1 spray into the nose daily as needed for allergies.   guaiFENesin 600 MG 12 hr tablet Commonly known as:  MUCINEX Take 600 mg by mouth daily as needed for to loosen phlegm.   HUMALOG KWIKPEN 100 UNIT/ML KiwkPen Generic drug:  insulin lispro Inject 4-6 Units into the skin 3 (three) times daily with meals. Inject 6 units subcutaneously with breakfast and lunch, inject 5 units with supper   HYDROcodone-acetaminophen 5-325 MG tablet Commonly known as:  NORCO/VICODIN Take 1 tablet by mouth every 6 (six) hours as needed. What changed:  how much to take   imipramine 25 MG tablet Commonly known as:  TOFRANIL Take 75 mg by mouth at bedtime.   levothyroxine 100 MCG tablet Commonly known as:  SYNTHROID, LEVOTHROID Take 100 mcg by mouth daily before breakfast.   montelukast 10 MG tablet Commonly known as:  SINGULAIR Take 10 mg by mouth daily  with breakfast.   potassium chloride SA 20 MEQ tablet Commonly known as:  K-DUR,KLOR-CON Take 1 tablet (20 mEq total) by mouth daily.   predniSONE 5 MG tablet Commonly known as:  DELTASONE Take 1 tablet (5 mg total) by mouth daily with breakfast.   triazolam 0.25 MG tablet Commonly known as:  HALCION Take 0.25 mg by mouth at bedtime as needed for sleep. For sleep      Follow-up Information    Renee Seashore, MD Follow up.   Specialty:  Internal Medicine Contact information: [address] [place] [address] [phone]        Theodis Blaze, MD Follow up.   Specialty:  Internal Medicine Why:  call my cell phone with questions [phone] Contact information: [address] [place] [address] [phone]            The results of significant diagnostics from this hospitalization (including imaging, microbiology, ancillary and laboratory) are listed below for reference.     Microbiology: No results found for this or any previous visit (from the past 240 hour(s)).   Labs: Basic Metabolic Panel:  Recent Labs Lab 11/17/16 1510 11/18/16 0309 11/19/16 0341  NA 135 136 137  K 3.3* 3.5 3.2*  CL 100* 104 105  CO2 28 24 26   GLUCOSE 172* 118* 142*  BUN 21* 12 8  CREATININE 0.70 0.62 0.67  CALCIUM 8.2* 7.8* 7.7*   Liver Function Tests:  Recent Labs Lab 11/17/16 1510  AST 19  ALT 13*  ALKPHOS 58  BILITOT 0.6  PROT 6.5  ALBUMIN 3.8    Recent Labs Lab 11/17/16 1510  LIPASE Renee   CBC:  Recent Labs Lab 11/17/16 1510 11/18/16 0309 11/19/16 0341  WBC 9.3 7.6 5.6  HGB 10.2* 9.4* 8.1*  HCT 32.6* 30.4* 26.7*  MCV 78.2 78.8 79.2  PLT 233 196 190   CBG:  Recent Labs Lab 11/18/16 1205 11/18/16 1600 11/18/16 1952 11/18/16 2248 11/19/16 0751  GLUCAP 125* 161* 127* 137* 116*   SIGNED: Time coordinating discharge:  30 minutes  Faye Ramsay, MD  [facility] 11/19/2016, 9:48 AM Pager [phone]  If 7PM-7AM, please contact night-coverage [email or web address] Password [email or web address]

## 2016-11-19 NOTE — Discharge Instructions (Signed)
Diarrhea, Adult Diarrhea is when you have loose and water poop (stool) often. Diarrhea can make you feel weak and cause you to get dehydrated. Dehydration can make you tired and thirsty, make you have a dry mouth, and make it so you pee (urinate) less often. Diarrhea often lasts 2-3 days. However, it can last longer if it is a sign of something more serious. It is important to treat your diarrhea as told by your doctor. Follow these instructions at home: Eating and drinking   Follow these recommendations as told by your doctor:  Take an oral rehydration solution (ORS). This is a drink that is sold at pharmacies and stores.  Drink clear fluids, such as:  Water.  Ice chips.  Diluted fruit juice.  Low-calorie sports drinks.  Eat bland, easy-to-digest foods in small amounts as you are able. These foods include:  Bananas.  Applesauce.  Rice.  Low-fat (lean) meats.  Toast.  Crackers.  Avoid drinking fluids that have a lot of sugar or caffeine in them.  Avoid alcohol.  Avoid spicy or fatty foods. General instructions    Drink enough fluid to keep your pee (urine) clear or pale yellow.  Wash your hands often. If you cannot use soap and water, use hand sanitizer.  Make sure that all people in your home wash their hands well and often.  Take over-the-counter and prescription medicines only as told by your doctor.  Rest at home while you get better.  Watch your condition for any changes.  Take a warm bath to help with any burning or pain from having diarrhea.  Keep all follow-up visits as told by your doctor. This is important. Contact a doctor if:  You have a fever.  Your diarrhea gets worse.  You have new symptoms.  You cannot keep fluids down.  You feel light-headed or dizzy.  You have a headache.  You have muscle cramps. Get help right away if:  You have chest pain.  You feel very weak or you pass out (faint).  You have bloody or black poop or  poop that look like tar.  You have very bad pain, cramping, or bloating in your belly (abdomen).  You have trouble breathing or you are breathing very quickly.  Your heart is beating very quickly.  Your skin feels cold and clammy.  You feel confused.  You have signs of dehydration, such as:  Dark pee, hardly any pee, or no pee.  Cracked lips.  Dry mouth.  Sunken eyes.  Sleepiness.  Weakness. This information is not intended to replace advice given to you by your health care provider. Make sure you discuss any questions you have with your health care provider. Document Released: 11/29/2007 Document Revised: 12/31/2015 Document Reviewed: 02/16/2015 Elsevier Interactive Patient Education  2017 Elsevier Inc.  

## 2016-11-20 LAB — GASTROINTESTINAL PANEL BY PCR, STOOL (REPLACES STOOL CULTURE)

## 2016-11-29 DIAGNOSIS — E109 Type 1 diabetes mellitus without complications: Secondary | ICD-10-CM | POA: Diagnosis not present

## 2016-12-04 DIAGNOSIS — M542 Cervicalgia: Secondary | ICD-10-CM | POA: Diagnosis not present

## 2016-12-04 DIAGNOSIS — Z6826 Body mass index (BMI) 26.0-26.9, adult: Secondary | ICD-10-CM | POA: Diagnosis not present

## 2016-12-04 DIAGNOSIS — I10 Essential (primary) hypertension: Secondary | ICD-10-CM | POA: Diagnosis not present

## 2016-12-04 DIAGNOSIS — M5412 Radiculopathy, cervical region: Secondary | ICD-10-CM | POA: Diagnosis not present

## 2016-12-12 DIAGNOSIS — K529 Noninfective gastroenteritis and colitis, unspecified: Secondary | ICD-10-CM | POA: Diagnosis not present

## 2016-12-12 DIAGNOSIS — D500 Iron deficiency anemia secondary to blood loss (chronic): Secondary | ICD-10-CM | POA: Diagnosis not present

## 2016-12-12 DIAGNOSIS — E876 Hypokalemia: Secondary | ICD-10-CM | POA: Diagnosis not present

## 2016-12-18 DIAGNOSIS — M546 Pain in thoracic spine: Secondary | ICD-10-CM | POA: Diagnosis not present

## 2016-12-18 DIAGNOSIS — R51 Headache: Secondary | ICD-10-CM | POA: Diagnosis not present

## 2016-12-18 DIAGNOSIS — R262 Difficulty in walking, not elsewhere classified: Secondary | ICD-10-CM | POA: Diagnosis not present

## 2016-12-18 DIAGNOSIS — M542 Cervicalgia: Secondary | ICD-10-CM | POA: Diagnosis not present

## 2016-12-25 DIAGNOSIS — R262 Difficulty in walking, not elsewhere classified: Secondary | ICD-10-CM | POA: Diagnosis not present

## 2016-12-25 DIAGNOSIS — R51 Headache: Secondary | ICD-10-CM | POA: Diagnosis not present

## 2016-12-25 DIAGNOSIS — M542 Cervicalgia: Secondary | ICD-10-CM | POA: Diagnosis not present

## 2016-12-25 DIAGNOSIS — M546 Pain in thoracic spine: Secondary | ICD-10-CM | POA: Diagnosis not present

## 2017-01-01 DIAGNOSIS — M546 Pain in thoracic spine: Secondary | ICD-10-CM | POA: Diagnosis not present

## 2017-01-01 DIAGNOSIS — R262 Difficulty in walking, not elsewhere classified: Secondary | ICD-10-CM | POA: Diagnosis not present

## 2017-01-01 DIAGNOSIS — M542 Cervicalgia: Secondary | ICD-10-CM | POA: Diagnosis not present

## 2017-01-01 DIAGNOSIS — R51 Headache: Secondary | ICD-10-CM | POA: Diagnosis not present

## 2017-01-04 DIAGNOSIS — M546 Pain in thoracic spine: Secondary | ICD-10-CM | POA: Diagnosis not present

## 2017-01-04 DIAGNOSIS — M542 Cervicalgia: Secondary | ICD-10-CM | POA: Diagnosis not present

## 2017-01-04 DIAGNOSIS — R51 Headache: Secondary | ICD-10-CM | POA: Diagnosis not present

## 2017-01-04 DIAGNOSIS — R262 Difficulty in walking, not elsewhere classified: Secondary | ICD-10-CM | POA: Diagnosis not present

## 2017-01-09 DIAGNOSIS — R51 Headache: Secondary | ICD-10-CM | POA: Diagnosis not present

## 2017-01-09 DIAGNOSIS — M546 Pain in thoracic spine: Secondary | ICD-10-CM | POA: Diagnosis not present

## 2017-01-09 DIAGNOSIS — M542 Cervicalgia: Secondary | ICD-10-CM | POA: Diagnosis not present

## 2017-01-09 DIAGNOSIS — R262 Difficulty in walking, not elsewhere classified: Secondary | ICD-10-CM | POA: Diagnosis not present

## 2017-01-11 DIAGNOSIS — R262 Difficulty in walking, not elsewhere classified: Secondary | ICD-10-CM | POA: Diagnosis not present

## 2017-01-11 DIAGNOSIS — R51 Headache: Secondary | ICD-10-CM | POA: Diagnosis not present

## 2017-01-11 DIAGNOSIS — M542 Cervicalgia: Secondary | ICD-10-CM | POA: Diagnosis not present

## 2017-01-11 DIAGNOSIS — M546 Pain in thoracic spine: Secondary | ICD-10-CM | POA: Diagnosis not present

## 2017-01-16 DIAGNOSIS — M546 Pain in thoracic spine: Secondary | ICD-10-CM | POA: Diagnosis not present

## 2017-01-16 DIAGNOSIS — R262 Difficulty in walking, not elsewhere classified: Secondary | ICD-10-CM | POA: Diagnosis not present

## 2017-01-16 DIAGNOSIS — R51 Headache: Secondary | ICD-10-CM | POA: Diagnosis not present

## 2017-01-16 DIAGNOSIS — M542 Cervicalgia: Secondary | ICD-10-CM | POA: Diagnosis not present

## 2017-02-06 DIAGNOSIS — D500 Iron deficiency anemia secondary to blood loss (chronic): Secondary | ICD-10-CM | POA: Diagnosis not present

## 2017-02-13 DIAGNOSIS — M546 Pain in thoracic spine: Secondary | ICD-10-CM | POA: Diagnosis not present

## 2017-02-13 DIAGNOSIS — M542 Cervicalgia: Secondary | ICD-10-CM | POA: Diagnosis not present

## 2017-02-13 DIAGNOSIS — R262 Difficulty in walking, not elsewhere classified: Secondary | ICD-10-CM | POA: Diagnosis not present

## 2017-02-13 DIAGNOSIS — R51 Headache: Secondary | ICD-10-CM | POA: Diagnosis not present

## 2017-02-15 DIAGNOSIS — E039 Hypothyroidism, unspecified: Secondary | ICD-10-CM | POA: Diagnosis not present

## 2017-02-15 DIAGNOSIS — E109 Type 1 diabetes mellitus without complications: Secondary | ICD-10-CM | POA: Diagnosis not present

## 2017-02-15 DIAGNOSIS — I2510 Atherosclerotic heart disease of native coronary artery without angina pectoris: Secondary | ICD-10-CM | POA: Diagnosis not present

## 2017-02-15 DIAGNOSIS — E782 Mixed hyperlipidemia: Secondary | ICD-10-CM | POA: Diagnosis not present

## 2017-02-20 DIAGNOSIS — R262 Difficulty in walking, not elsewhere classified: Secondary | ICD-10-CM | POA: Diagnosis not present

## 2017-02-20 DIAGNOSIS — M546 Pain in thoracic spine: Secondary | ICD-10-CM | POA: Diagnosis not present

## 2017-02-20 DIAGNOSIS — M542 Cervicalgia: Secondary | ICD-10-CM | POA: Diagnosis not present

## 2017-02-20 DIAGNOSIS — R51 Headache: Secondary | ICD-10-CM | POA: Diagnosis not present

## 2017-02-22 DIAGNOSIS — R51 Headache: Secondary | ICD-10-CM | POA: Diagnosis not present

## 2017-02-22 DIAGNOSIS — R262 Difficulty in walking, not elsewhere classified: Secondary | ICD-10-CM | POA: Diagnosis not present

## 2017-02-22 DIAGNOSIS — M542 Cervicalgia: Secondary | ICD-10-CM | POA: Diagnosis not present

## 2017-02-22 DIAGNOSIS — M546 Pain in thoracic spine: Secondary | ICD-10-CM | POA: Diagnosis not present

## 2017-02-27 DIAGNOSIS — M546 Pain in thoracic spine: Secondary | ICD-10-CM | POA: Diagnosis not present

## 2017-02-27 DIAGNOSIS — R51 Headache: Secondary | ICD-10-CM | POA: Diagnosis not present

## 2017-02-27 DIAGNOSIS — M542 Cervicalgia: Secondary | ICD-10-CM | POA: Diagnosis not present

## 2017-02-27 DIAGNOSIS — R262 Difficulty in walking, not elsewhere classified: Secondary | ICD-10-CM | POA: Diagnosis not present

## 2017-02-28 DIAGNOSIS — M542 Cervicalgia: Secondary | ICD-10-CM | POA: Diagnosis not present

## 2017-02-28 DIAGNOSIS — Z6825 Body mass index (BMI) 25.0-25.9, adult: Secondary | ICD-10-CM | POA: Diagnosis not present

## 2017-02-28 DIAGNOSIS — I10 Essential (primary) hypertension: Secondary | ICD-10-CM | POA: Diagnosis not present

## 2017-02-28 DIAGNOSIS — M5412 Radiculopathy, cervical region: Secondary | ICD-10-CM | POA: Diagnosis not present

## 2017-03-01 DIAGNOSIS — R262 Difficulty in walking, not elsewhere classified: Secondary | ICD-10-CM | POA: Diagnosis not present

## 2017-03-01 DIAGNOSIS — R51 Headache: Secondary | ICD-10-CM | POA: Diagnosis not present

## 2017-03-01 DIAGNOSIS — M546 Pain in thoracic spine: Secondary | ICD-10-CM | POA: Diagnosis not present

## 2017-03-01 DIAGNOSIS — M542 Cervicalgia: Secondary | ICD-10-CM | POA: Diagnosis not present

## 2017-03-08 DIAGNOSIS — M542 Cervicalgia: Secondary | ICD-10-CM | POA: Diagnosis not present

## 2017-03-08 DIAGNOSIS — R262 Difficulty in walking, not elsewhere classified: Secondary | ICD-10-CM | POA: Diagnosis not present

## 2017-03-08 DIAGNOSIS — M546 Pain in thoracic spine: Secondary | ICD-10-CM | POA: Diagnosis not present

## 2017-03-08 DIAGNOSIS — R51 Headache: Secondary | ICD-10-CM | POA: Diagnosis not present

## 2017-03-12 DIAGNOSIS — H353233 Exudative age-related macular degeneration, bilateral, with inactive scar: Secondary | ICD-10-CM | POA: Diagnosis not present

## 2017-03-13 DIAGNOSIS — M542 Cervicalgia: Secondary | ICD-10-CM | POA: Diagnosis not present

## 2017-03-13 DIAGNOSIS — M546 Pain in thoracic spine: Secondary | ICD-10-CM | POA: Diagnosis not present

## 2017-03-13 DIAGNOSIS — R262 Difficulty in walking, not elsewhere classified: Secondary | ICD-10-CM | POA: Diagnosis not present

## 2017-03-13 DIAGNOSIS — R51 Headache: Secondary | ICD-10-CM | POA: Diagnosis not present

## 2017-03-20 ENCOUNTER — Ambulatory Visit (INDEPENDENT_AMBULATORY_CARE_PROVIDER_SITE_OTHER): Payer: Medicare Other | Admitting: Pulmonary Disease

## 2017-03-20 ENCOUNTER — Encounter: Payer: Self-pay | Admitting: Pulmonary Disease

## 2017-03-20 VITALS — BP 124/70 | HR 72 | Ht 62.0 in | Wt 131.0 lb

## 2017-03-20 DIAGNOSIS — J0101 Acute recurrent maxillary sinusitis: Secondary | ICD-10-CM | POA: Diagnosis not present

## 2017-03-20 DIAGNOSIS — L0889 Other specified local infections of the skin and subcutaneous tissue: Secondary | ICD-10-CM | POA: Diagnosis not present

## 2017-03-20 DIAGNOSIS — L089 Local infection of the skin and subcutaneous tissue, unspecified: Secondary | ICD-10-CM | POA: Diagnosis not present

## 2017-03-20 DIAGNOSIS — J438 Other emphysema: Secondary | ICD-10-CM | POA: Diagnosis not present

## 2017-03-20 DIAGNOSIS — J8489 Other specified interstitial pulmonary diseases: Secondary | ICD-10-CM | POA: Diagnosis not present

## 2017-03-20 DIAGNOSIS — I2510 Atherosclerotic heart disease of native coronary artery without angina pectoris: Secondary | ICD-10-CM

## 2017-03-20 DIAGNOSIS — M349 Systemic sclerosis, unspecified: Secondary | ICD-10-CM

## 2017-03-20 DIAGNOSIS — J329 Chronic sinusitis, unspecified: Secondary | ICD-10-CM

## 2017-03-20 DIAGNOSIS — L718 Other rosacea: Secondary | ICD-10-CM | POA: Diagnosis not present

## 2017-03-20 DIAGNOSIS — R21 Rash and other nonspecific skin eruption: Secondary | ICD-10-CM | POA: Diagnosis not present

## 2017-03-20 DIAGNOSIS — L738 Other specified follicular disorders: Secondary | ICD-10-CM | POA: Diagnosis not present

## 2017-03-20 MED ORDER — AMOXICILLIN-POT CLAVULANATE 875-125 MG PO TABS: 1.0000 | ORAL_TABLET | Freq: Two times a day (BID) | ORAL | 0 refills | Status: DC

## 2017-03-20 NOTE — Patient Instructions (Signed)
Augmentin 1 pill twice daily for 7 days  Follow up in 6 months

## 2017-03-20 NOTE — Progress Notes (Signed)
Current Outpatient Prescriptions on File Prior to Visit  Medication Sig  . aspirin EC 81 MG tablet Take 1 tablet (81 mg total) by mouth daily.  . cetirizine (ZYRTEC) 10 MG tablet Take 10 mg by mouth daily after breakfast.   . esomeprazole (NEXIUM) 40 MG capsule Take 40 mg by mouth daily.   . ferrous sulfate 324 (65 Fe) MG TBEC Take 1 tablet (324 mg total) by mouth every other day.  . fluticasone (FLONASE) 50 MCG/ACT nasal spray Place 1 spray into the nose daily as needed for allergies.   [name] guaiFENesin (MUCINEX) 600 MG 12 hr tablet Take 600 mg by mouth daily as needed for to loosen phlegm.   [name] HYDROcodone-acetaminophen (NORCO/VICODIN) 5-325 MG tablet Take 1 tablet by mouth every 6 (six) hours as needed. (Patient taking differently: Take 0.5 tablets by mouth every 6 (six) hours as needed. )  . imipramine (TOFRANIL) 25 MG tablet Take 75 mg by mouth at bedtime.   . insulin lispro (HUMALOG KWIKPEN) 100 UNIT/ML KiwkPen Inject 4-6 Units into the skin 3 (three) times daily with meals. Inject 6 units subcutaneously with breakfast and lunch, inject 5 units with supper  . levothyroxine (SYNTHROID, LEVOTHROID) 100 MCG tablet Take 100 mcg by mouth daily before breakfast.  . montelukast (SINGULAIR) 10 MG tablet Take 10 mg by mouth daily with breakfast.  . predniSONE (DELTASONE) 5 MG tablet Take 1 tablet (5 mg total) by mouth daily with breakfast.  . triazolam (HALCION) 0.25 MG tablet Take 0.25 mg by mouth at bedtime as needed for sleep. For sleep  . potassium chloride SA (K-DUR,KLOR-CON) 20 MEQ tablet Take 1 tablet (20 mEq total) by mouth daily.   No current facility-administered medications on file prior to visit.      Chief Complaint  Patient presents with  . Follow-up    Pt seen PCP this morning regarding possible shingles. Pt had a tough bleed with dry mouth, is concerned about that due to having a clot in her mouth. Pt has low grade fever last 3 days, having sinus congestion. Pt has cough, nasal drip.       Pulmonary tests August 2010 >> Bronchoscopy PFT 06/16/09 >> FEV1 1.99(118%), FEV1% 73, TLC 4.60(105%), DLCO 66%, no BD CT chest 11/24/11 >> centrilobular emphysema, RUL ASD CT sinus 07/30/13 >> opacification of Lt maxillary sinus, rightward NSD CT sinus 10/05/15 >> b/l mucosal edema paranasal sinuses, obstruction of ostium Rt maxillary sinus  Past medical history CAD, HTN, HLD, DM, CVA, Macular degeneration, GERD, Dysphagia, IBS, Depression, Fibromyalgia, Hypothyroidism, Sjogren syndrome, Raynaud's phenomenon, Scleroderma  Past surgical history, Family history, Social history, Allergies reviewed  Vital Signs BP 124/70 (BP Location: Left Arm, Cuff Size: Normal)   Pulse 72   Ht 5\' 2"  (1.575 m)   Wt 131 lb (59.4 kg)   SpO2 97%   BMI 23.96 kg/m   History of Present Illness [name] is a 83 y.o. 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  She was in hospital recently for oral bleeding.  Initially thought to be Upper GI, but then found to have laceration of her tongue.  Likely related to dry mouth in setting of Sjogren's.  She has been feeling low grade temperature.  She is getting more sinus congestion and pressure.  Has post nasal drip.  This is how she feels before sinus infection kicks in.  She is not have chest pain, or wheeze.  She was seen by dermatologist for skin rash.  Was told it could be shingles, but doesn't seem to follow any pattern.  She is planning trip to visit her brother in [place] for his 27 birthday.  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 - continue maintenance dose of prednisone 5 mg daily  Recurrent sinus infections with acute sinusitis. - will give course of augmentin - continue zyrtec, flonase, mucinex, singulair - she has been seen by Dr. Lucia Gaskins with ENT previously  Hx of tobacco abuse with emphysema. - she has not noticed benefit from previous trials of inhaler therapy - monitor clinically  Hx of connective tissue disease. - f/u with rheumatology  Skin rash. - doesn't seem to follow dermatomal pattern - f/u with dermatology   Patient Instructions  Augmentin 1 pill twice daily for 7 days  Follow up in 6 months   Chesley Mires, MD [facility] Pager:  [phone] 03/20/2017, 11:23 AM

## 2017-03-22 DIAGNOSIS — M546 Pain in thoracic spine: Secondary | ICD-10-CM | POA: Diagnosis not present

## 2017-03-22 DIAGNOSIS — M542 Cervicalgia: Secondary | ICD-10-CM | POA: Diagnosis not present

## 2017-03-22 DIAGNOSIS — R51 Headache: Secondary | ICD-10-CM | POA: Diagnosis not present

## 2017-03-22 DIAGNOSIS — R262 Difficulty in walking, not elsewhere classified: Secondary | ICD-10-CM | POA: Diagnosis not present

## 2017-04-05 DIAGNOSIS — M542 Cervicalgia: Secondary | ICD-10-CM | POA: Diagnosis not present

## 2017-04-05 DIAGNOSIS — R51 Headache: Secondary | ICD-10-CM | POA: Diagnosis not present

## 2017-04-05 DIAGNOSIS — M546 Pain in thoracic spine: Secondary | ICD-10-CM | POA: Diagnosis not present

## 2017-04-05 DIAGNOSIS — R262 Difficulty in walking, not elsewhere classified: Secondary | ICD-10-CM | POA: Diagnosis not present

## 2017-04-10 DIAGNOSIS — M546 Pain in thoracic spine: Secondary | ICD-10-CM | POA: Diagnosis not present

## 2017-04-10 DIAGNOSIS — M542 Cervicalgia: Secondary | ICD-10-CM | POA: Diagnosis not present

## 2017-04-10 DIAGNOSIS — R51 Headache: Secondary | ICD-10-CM | POA: Diagnosis not present

## 2017-04-10 DIAGNOSIS — R262 Difficulty in walking, not elsewhere classified: Secondary | ICD-10-CM | POA: Diagnosis not present

## 2017-04-12 DIAGNOSIS — M546 Pain in thoracic spine: Secondary | ICD-10-CM | POA: Diagnosis not present

## 2017-04-12 DIAGNOSIS — R51 Headache: Secondary | ICD-10-CM | POA: Diagnosis not present

## 2017-04-12 DIAGNOSIS — R262 Difficulty in walking, not elsewhere classified: Secondary | ICD-10-CM | POA: Diagnosis not present

## 2017-04-12 DIAGNOSIS — M542 Cervicalgia: Secondary | ICD-10-CM | POA: Diagnosis not present

## 2017-04-19 DIAGNOSIS — M546 Pain in thoracic spine: Secondary | ICD-10-CM | POA: Diagnosis not present

## 2017-04-19 DIAGNOSIS — R51 Headache: Secondary | ICD-10-CM | POA: Diagnosis not present

## 2017-04-19 DIAGNOSIS — M542 Cervicalgia: Secondary | ICD-10-CM | POA: Diagnosis not present

## 2017-04-19 DIAGNOSIS — R262 Difficulty in walking, not elsewhere classified: Secondary | ICD-10-CM | POA: Diagnosis not present

## 2017-04-24 DIAGNOSIS — M546 Pain in thoracic spine: Secondary | ICD-10-CM | POA: Diagnosis not present

## 2017-04-24 DIAGNOSIS — R51 Headache: Secondary | ICD-10-CM | POA: Diagnosis not present

## 2017-04-24 DIAGNOSIS — M542 Cervicalgia: Secondary | ICD-10-CM | POA: Diagnosis not present

## 2017-04-24 DIAGNOSIS — R262 Difficulty in walking, not elsewhere classified: Secondary | ICD-10-CM | POA: Diagnosis not present

## 2017-05-03 DIAGNOSIS — M546 Pain in thoracic spine: Secondary | ICD-10-CM | POA: Diagnosis not present

## 2017-05-03 DIAGNOSIS — R262 Difficulty in walking, not elsewhere classified: Secondary | ICD-10-CM | POA: Diagnosis not present

## 2017-05-03 DIAGNOSIS — M542 Cervicalgia: Secondary | ICD-10-CM | POA: Diagnosis not present

## 2017-05-03 DIAGNOSIS — R51 Headache: Secondary | ICD-10-CM | POA: Diagnosis not present

## 2017-05-08 DIAGNOSIS — I10 Essential (primary) hypertension: Secondary | ICD-10-CM | POA: Diagnosis not present

## 2017-05-08 DIAGNOSIS — E1165 Type 2 diabetes mellitus with hyperglycemia: Secondary | ICD-10-CM | POA: Diagnosis not present

## 2017-05-08 DIAGNOSIS — E039 Hypothyroidism, unspecified: Secondary | ICD-10-CM | POA: Diagnosis not present

## 2017-05-28 ENCOUNTER — Telehealth: Payer: Self-pay | Admitting: Internal Medicine

## 2017-05-28 NOTE — Telephone Encounter (Signed)
Spoke with [facility].  Pt is part of their in home based palliative care program.   Pt described to them, occurring last Wed night, an episode of chest pain, under right breast.  Got up and moved around and it got better.  She relates it to the pain she had when she had stenting. SInce Wed night, she had 2 more eipisodes.  She was in bed but not asleep all 3 times.  Takes Nexium every AM. Palliative care got order today for NTG for her to use as needed. They wanted to keep Dr. Harrington Challenger updated.  Would like a call back if anything else would be recommended.  Pt has ov in Jan with Dr. Harrington Challenger.

## 2017-05-28 NOTE — Telephone Encounter (Signed)
New message  [name] is calling for the rn   Pt is reporting 3 episodes of angina and rn is not sure if it is that or upper GI  Want to know what Dr.Ross is wanting to do, option is fresh prescription of nitroglycerine

## 2017-05-28 NOTE — Telephone Encounter (Signed)
Agree with recommendations  Follow BP and O2 sats

## 2017-05-29 NOTE — Telephone Encounter (Signed)
Informed [name] with hospice to follow BP and O2 sats.  The patient knows to call them if she needs anything.  The nurse sees her about once every other week.  They will call with any changes/updates.

## 2017-06-11 IMAGING — CR DG CHEST 2V
2 series · 2 of 2 positions shown · non-contrast
Comparison: PA and lateral chest x-ray October 20, 2014

CLINICAL DATA: Cough and shortness of breath and intermittent
low-grade fever for the past week ; history of COPD, CVA, and CAD

EXAM:
CHEST  2 VIEW

[view not recorded (1 of 2)]
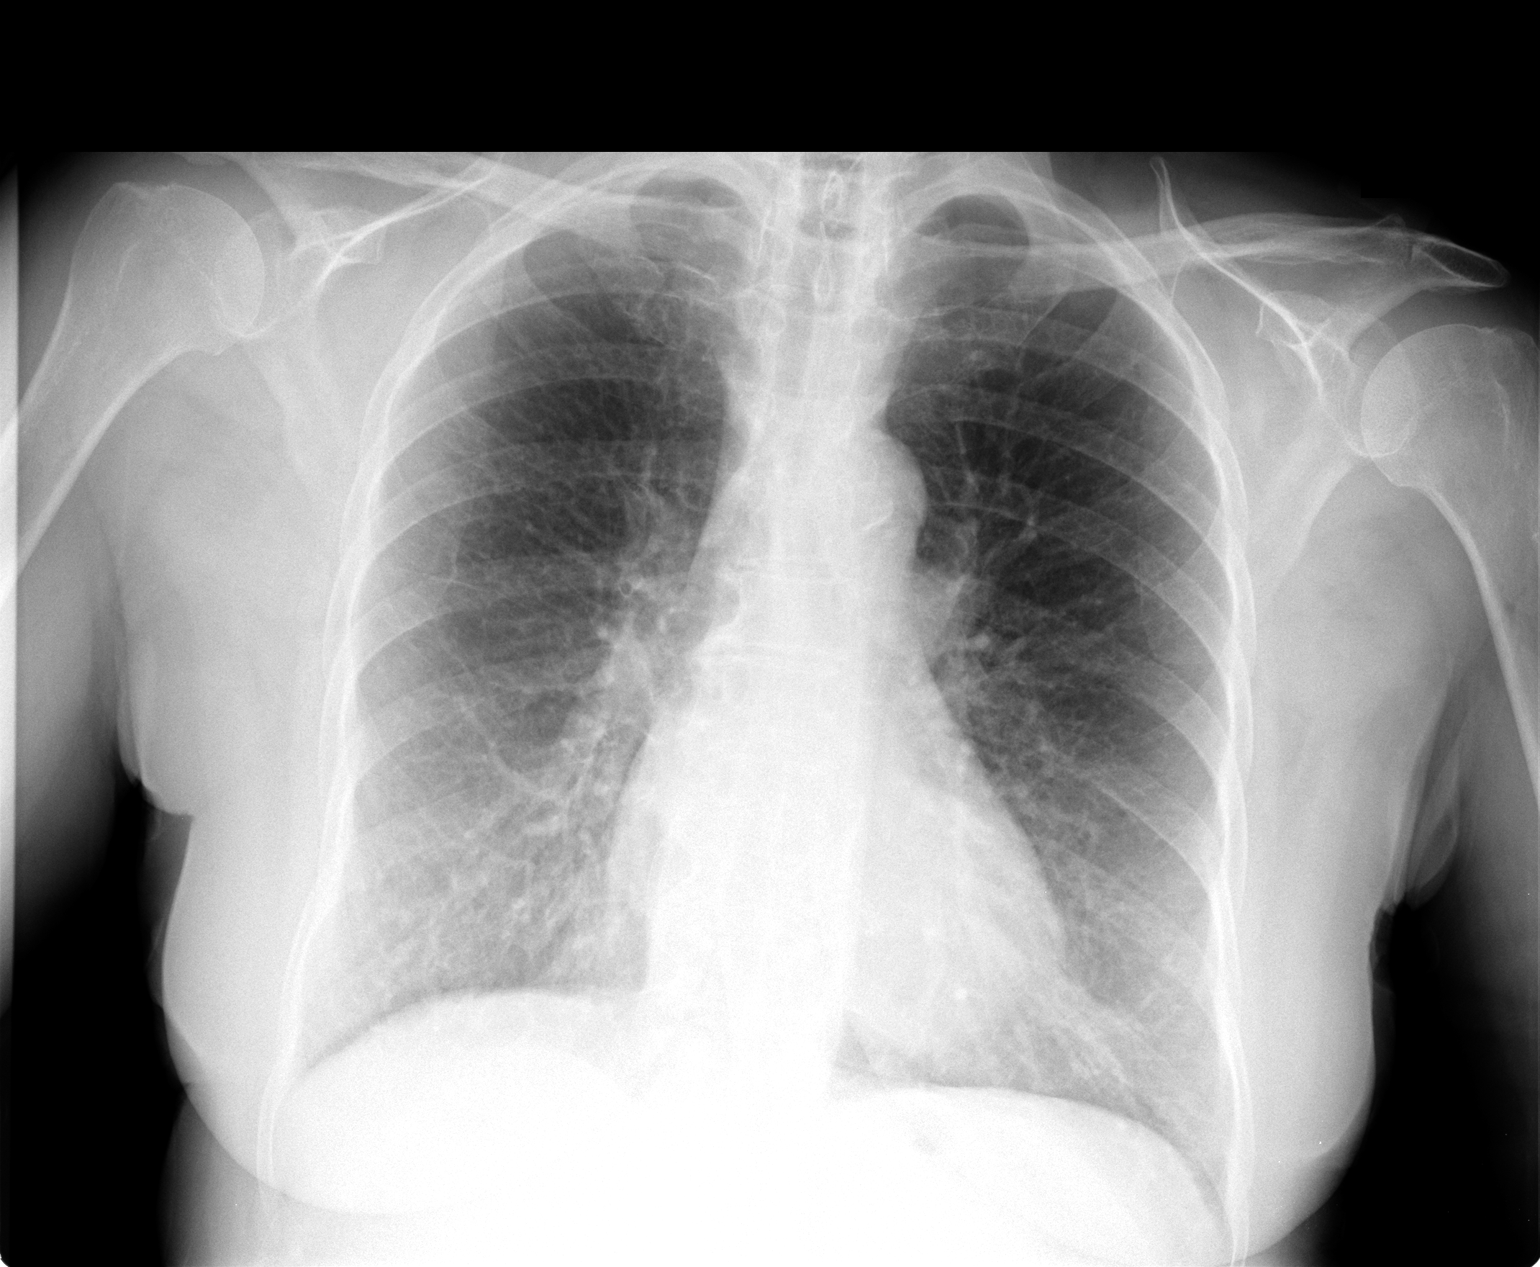

[view not recorded (2 of 2)]
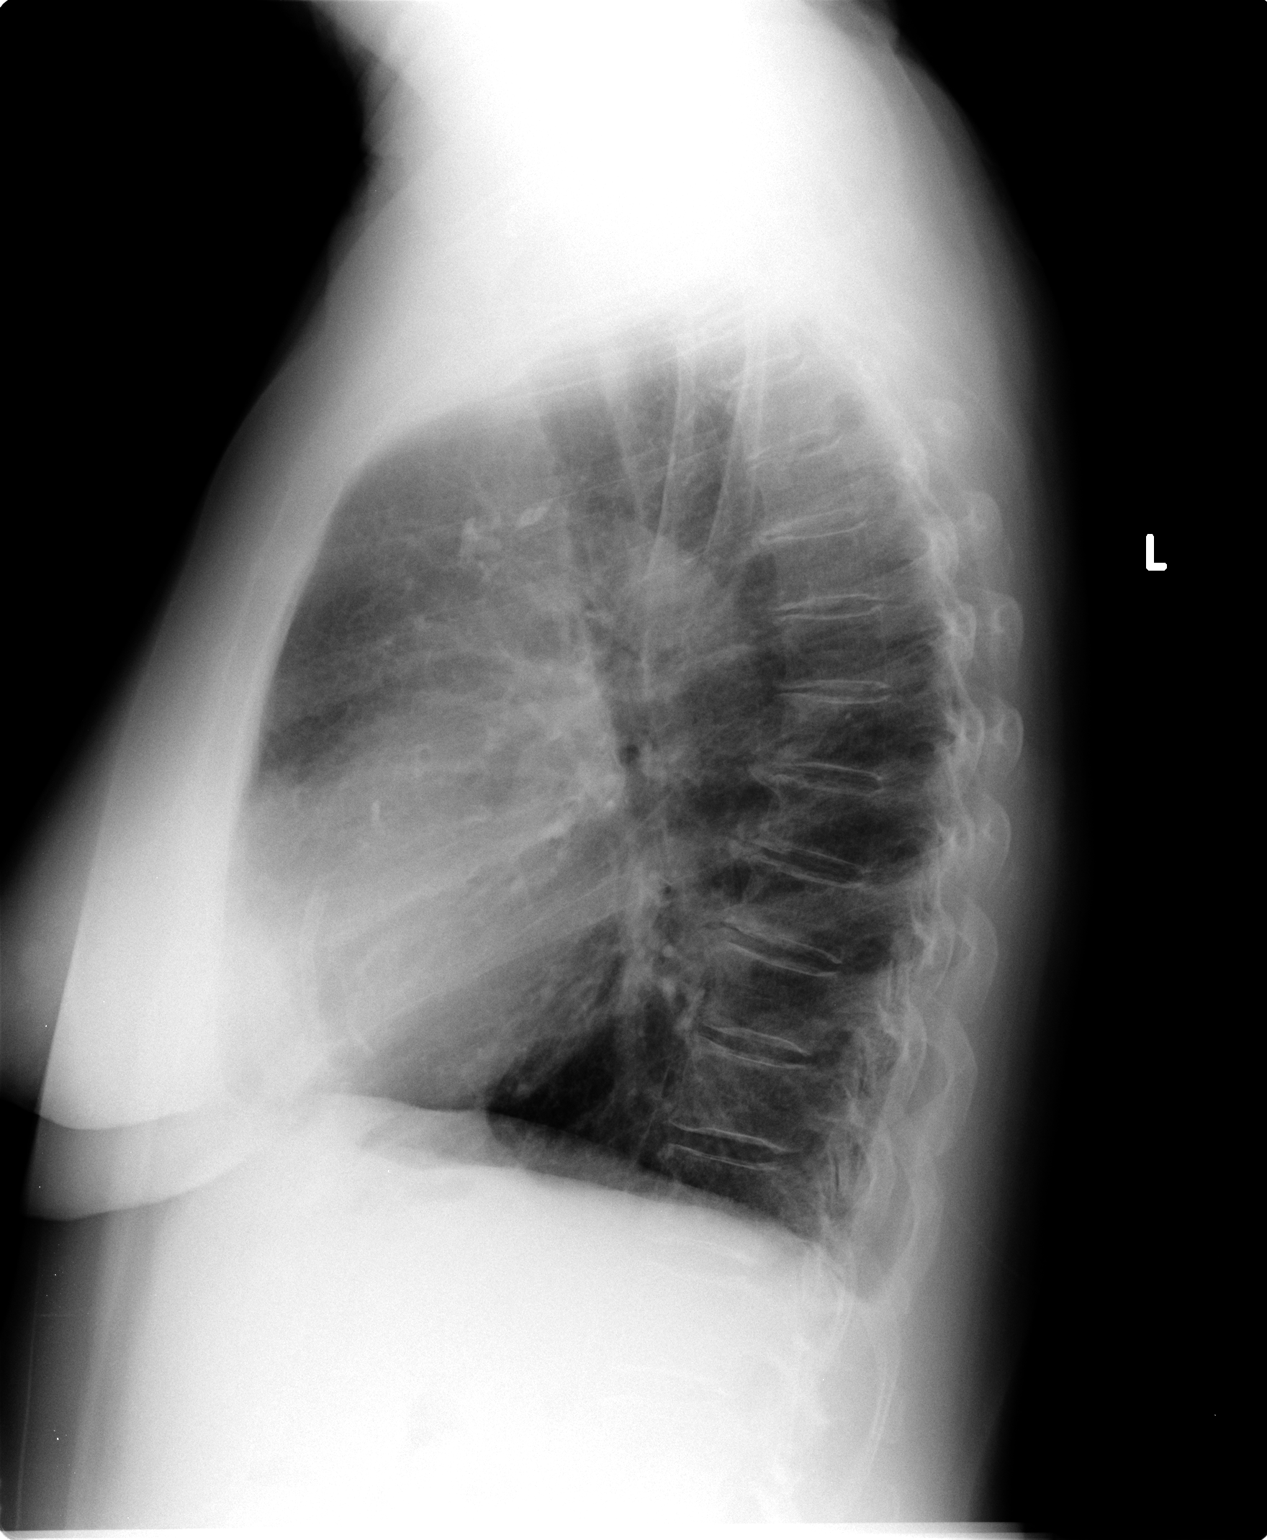

[2 of 2 positions shown; findings below may reference images not displayed]

FINDINGS: The lungs are well-expanded. The interstitial markings are increased
bilaterally. There are coarse infrahilar lung markings bilaterally
which are not new. The heart and pulmonary vascularity are normal.
The mediastinum is normal in width. There is no pleural effusion.
The bony thorax exhibits no acute abnormality. There surgical clips
in the left paratracheal region in the lower neck.
IMPRESSION: COPD with mildly increased prominence of the chronically increased
interstitial markings suggesting superimposed acute bronchitis.
There is no pneumonia nor alveolar edema.

## 2017-06-14 ENCOUNTER — Ambulatory Visit (INDEPENDENT_AMBULATORY_CARE_PROVIDER_SITE_OTHER): Payer: Medicare Other | Admitting: Sports Medicine

## 2017-06-14 DIAGNOSIS — I2510 Atherosclerotic heart disease of native coronary artery without angina pectoris: Secondary | ICD-10-CM | POA: Diagnosis not present

## 2017-06-14 DIAGNOSIS — M25579 Pain in unspecified ankle and joints of unspecified foot: Secondary | ICD-10-CM | POA: Diagnosis present

## 2017-06-14 NOTE — Assessment & Plan Note (Signed)
Heel pads added These really lessened pain  We also made a sports insole with heel cusions She will use these in other shoes  Try these interventions and we did not add meds

## 2017-06-14 NOTE — Progress Notes (Signed)
CC: Foot and ankle pain  Patient seen last year with bilat ankle pain XR showed some thinning of tibio talar space but no other changes No recent injury Pain pattern is mid and lateral heel Furts up to ankle mortise Standing and walking make this worse  ROS No swelling Forefoot pain from last year has resolved  PE Elderly female in NAD BP (!) 147/56   Ankles show no swelling Normal ROM of ankles Ligaments stable Feet show some thinning of fat pads heel and forefoot No TTP heel  Walking gait is- small steps and cautious but no limp

## 2017-06-27 ENCOUNTER — Ambulatory Visit (INDEPENDENT_AMBULATORY_CARE_PROVIDER_SITE_OTHER)
Admission: RE | Admit: 2017-06-27 | Discharge: 2017-06-27 | Disposition: A | Discharge status: Home / Self Care | Payer: Medicare Other | Source: Ambulatory Visit | Attending: Acute Care | Admitting: Acute Care

## 2017-06-27 ENCOUNTER — Telehealth: Payer: Self-pay | Admitting: Pulmonary Disease

## 2017-06-27 ENCOUNTER — Telehealth: Payer: Self-pay | Admitting: Acute Care

## 2017-06-27 ENCOUNTER — Encounter: Payer: Self-pay | Admitting: Acute Care

## 2017-06-27 ENCOUNTER — Other Ambulatory Visit (INDEPENDENT_AMBULATORY_CARE_PROVIDER_SITE_OTHER): Payer: Medicare Other

## 2017-06-27 ENCOUNTER — Ambulatory Visit (INDEPENDENT_AMBULATORY_CARE_PROVIDER_SITE_OTHER): Payer: Medicare Other | Admitting: Acute Care

## 2017-06-27 VITALS — BP 112/60 | HR 77 | Ht 62.0 in | Wt 131.0 lb

## 2017-06-27 DIAGNOSIS — J181 Lobar pneumonia, unspecified organism: Secondary | ICD-10-CM | POA: Diagnosis not present

## 2017-06-27 DIAGNOSIS — J8489 Other specified interstitial pulmonary diseases: Secondary | ICD-10-CM | POA: Diagnosis not present

## 2017-06-27 DIAGNOSIS — J189 Pneumonia, unspecified organism: Secondary | ICD-10-CM | POA: Diagnosis not present

## 2017-06-27 DIAGNOSIS — R05 Cough: Secondary | ICD-10-CM | POA: Diagnosis not present

## 2017-06-27 DIAGNOSIS — J029 Acute pharyngitis, unspecified: Secondary | ICD-10-CM

## 2017-06-27 LAB — CBC WITH DIFFERENTIAL/PLATELET
Basophils Absolute: 0.1 10*3/uL (ref 0.0–0.1)
Basophils Relative: 1.1 % (ref 0.0–3.0)
Eosinophils Absolute: 0.1 10*3/uL (ref 0.0–0.7)
Eosinophils Relative: 1.6 % (ref 0.0–5.0)
HCT: 38.2 % (ref 36.0–46.0)
Hemoglobin: 12.6 g/dL (ref 12.0–15.0)
Lymphocytes Relative: 25.8 % (ref 12.0–46.0)
Lymphs Abs: 2.3 10*3/uL (ref 0.7–4.0)
MCHC: 32.9 g/dL (ref 30.0–36.0)
MCV: 86.5 fl (ref 78.0–100.0)
Monocytes Absolute: 0.7 10*3/uL (ref 0.1–1.0)
Monocytes Relative: 8.2 % (ref 3.0–12.0)
Neutro Abs: 5.7 10*3/uL (ref 1.4–7.7)
Neutrophils Relative %: 63.3 % (ref 43.0–77.0)
Platelets: 215 10*3/uL (ref 150.0–400.0)
RBC: 4.42 Mil/uL (ref 3.87–5.11)
RDW: 13.3 % (ref 11.5–15.5)
WBC: 9.1 10*3/uL (ref 4.0–10.5)

## 2017-06-27 LAB — SEDIMENTATION RATE: Sed Rate: 86 mm/hr — ABNORMAL HIGH (ref 0–30)

## 2017-06-27 LAB — BETA STREP SCREEN: Streptococcus, Group A Screen (Direct): NEGATIVE

## 2017-06-27 MED ORDER — PREDNISONE 10 MG PO TABS: ORAL_TABLET | ORAL | 0 refills | Status: DC

## 2017-06-27 MED ORDER — METHYLPREDNISOLONE ACETATE 80 MG/ML IJ SUSP
80.0000 mg | Freq: Once | INTRAMUSCULAR | Status: AC
  Administered 2017-06-27: 80 mg via INTRAMUSCULAR

## 2017-06-27 MED ORDER — METHYLPREDNISOLONE ACETATE 80 MG/ML IJ SUSP: 80.0000 mg | Freq: Once | INTRAMUSCULAR | Status: DC

## 2017-06-27 MED ORDER — DOXYCYCLINE HYCLATE 100 MG PO TABS: 100.0000 mg | ORAL_TABLET | Freq: Two times a day (BID) | ORAL | 0 refills | Status: DC

## 2017-06-27 NOTE — Patient Instructions (Addendum)
It is good to see you today. Sputum culture now CBC with sed rate now BMET now Throat culture now. Stop your augmentin. Doxycycline 100 mg twice daily x 10 days. Take probiotic with antibiotic Depo injection 80 mg  now 06/28/2017>> start Prednisone 20 mg twice daily x 5 days Prednisone 10 mg twice daily x 5 days 10 mg daily until return. Fluids and eat as much as possible to help with strength. Follow up with Dr. Halford Chessman or [name] in 2-3 weeks Call immediately if your develop diarrhea Please contact office for sooner follow up if symptoms do not improve or worsen or seek emergency care

## 2017-06-27 NOTE — Telephone Encounter (Signed)
Spoke with pt's daughter, [name]. States that pt possible has PNA. Pt is a pt with [facility] and the nurse came to see her this morning and advised Terri to call us. Reports fatigue, weakness, chest congestion and high fever. I have scheduled pt with SG this afternoon at 3:30pm. I have asked Terri to have the pt here by 3:15pm so that we can do a CXR. Order has been placed for CXR. Nothing further was needed.

## 2017-06-27 NOTE — Progress Notes (Addendum)
History of Present Illness [name] is a 84 y.o. 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 Halford Chessman   06/28/2017 Acute OV; Pt presents for 1 week of fatigue, fever  and sore throat. She was seen by [facility] 06/25/2018, and she was treated with augmentin  starting 06/24/2017 at that time and she states she had more of a sinusitis type symptoms. She states she has been getting worse.more weakness and fatigue. She is working hard to eat and drink to maintain her strength. Per her daughter,she gets pneumonia frequently and is concerned this has evolved into pneumonia.  Test Results CXR 06/27/2017: IMPRESSION: New ill-defined right upper lobe nodularity, likely inflammatory or atypical infection. Followup PA and lateral chest X-ray is recommended in 3-4 weeks following trial of antibiotic therapy to ensure resolution and exclude underlying malignancy.  Sed Rate 06/27/2017>>86 Sputum Culture 06/27/2017>>  Gram Stain>>Rare Polymorphonuclear leukocytes Few epithelial cells Rare Gram positive cocci in pairs Rare Gram negative bacilli Rare Gram positive bacilli    Pulmonary tests August 2010 >> Bronchoscopy PFT 06/16/09 >> FEV1 1.99(118%), FEV1% 73, TLC 4.60(105%), DLCO 66%, no BD CT chest 11/24/11 >> centrilobular emphysema, RUL ASD CT sinus 07/30/13 >> opacification of Lt maxillary sinus, rightward NSD CT sinus 10/05/15 >> b/l mucosal edema paranasal sinuses, obstruction of ostium Rt maxillary sinus   CBC Latest Ref Rng & Units 06/27/2017 11/19/2016 11/18/2016  WBC 4.0 - 10.5 K/uL 9.1 5.6 7.6  Hemoglobin 12.0 - 15.0 g/dL 12.6 8.1(L) 9.4(L)  Hematocrit 36.0 - 46.0 % 38.2 26.7(L) 30.4(L)  Platelets 150.0 - 400.0 K/uL 215.0 190 196    BMP Latest Ref Rng & Units 11/19/2016 11/18/2016 11/17/2016  Glucose 65 - 99 mg/dL 142(H) 118(H) 172(H)  BUN 6 - 20 mg/dL 8 12 21(H)    Creatinine 0.44 - 1.00 mg/dL 0.67 0.62 0.70  BUN/Creat Ratio 12 - 28 - - -  Sodium 135 - 145 mmol/L 137 136 135  Potassium 3.5 - 5.1 mmol/L 3.2(L) 3.5 3.3(L)  Chloride 101 - 111 mmol/L 105 104 100(L)  CO2 22 - 32 mmol/L 26 24 28   Calcium 8.9 - 10.3 mg/dL 7.7(L) 7.8(L) 8.2(L)    ProBNP    Component Value Date/Time   PROBNP 150.0 (H) 02/02/2015 1156     Dg Chest 2 View  Result Date: 06/27/2017 CLINICAL DATA:  Dyspnea with cough and fever for 2 weeks. History of pneumonia, diabetes and myocardial infarction. EXAM: CHEST  2 VIEW COMPARISON:  Abdominal CT 11/17/2016, chest radiographs 09/26/2016 and chest CT 06/14/2016. FINDINGS: The heart size and mediastinal contours are stable. There is no definite adenopathy. There is aortic atherosclerosis and a coronary stent. There is new multifocal right upper lobe nodularity which is best seen on the frontal examination. There is diffuse central airway thickening. No other focal nodularity or airspace opacity identified. Postsurgical changes consistent with previous resection of the left thyroid lobe again noted. There are degenerative changes in the spine. IMPRESSION: New ill-defined right upper lobe nodularity, likely inflammatory or atypical infection. Followup PA and lateral chest X-ray is recommended in 3-4 weeks following trial of antibiotic therapy to ensure resolution and exclude underlying malignancy. Electronically Signed   By: Richardean Sale M.D.   On: 06/27/2017 15:23     Past medical hx Past Medical History:  Diagnosis Date  . Adenomatous colon polyp   . Arthritis   . Blood transfusion   .  Bronchitis   . Cancer [facility])    cervical cancer pre hysterectomy  . Cervical disc disease   . Chronic sinusitis   . CONSTIPATION, CHRONIC 06/26/2007   Qualifier: Diagnosis of  By: Nils Pyle CMA ([place]), Mearl Latin    . COPD (chronic obstructive pulmonary disease) ([place])   . Coronary artery disease   . CVA (cerebral infarction) 1991  . Depression   .  Diabetes mellitus   . Diverticulosis   . 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 Dyslipidemia   . Emphysema   . Fibromyalgia   . GERD (gastroesophageal reflux disease)   . Hypertension   . Hypothyroidism   . IBS (irritable bowel syndrome)   . Legally blind   . Macular degeneration   . Myocardial infarction [facility]) may 27th 2007  . Pneumonia 07/16/2013    HX PNEUMONIA  . Pneumonia   . Pulmonary hypertension ([place])   . Raynaud's phenomenon   . Scleroderma ([place])   . Sjogren's disease ([place])   . Sleep apnea    STOP BANG SCORE 5  . Stroke [facility]) 1990   brain stem stroke - weakness rt hand  . Zenker's diverticulum      Social History   Tobacco Use  . Smoking status: Former Smoker    Packs/day: 1.00    Years: 40.00    Pack years: 40.00    Types: Cigarettes    Last attempt to quit: 06/26/1988    Years since quitting: 29.0  . Smokeless tobacco: Never Used  . Tobacco comment: 40 pack years  Substance Use Topics  . Alcohol use: No  . Drug use: No    Ms.Weatherly reports that she quit smoking about 29 years ago. Her smoking use included cigarettes. She has a 40.00 pack-year smoking history. she has never used smokeless tobacco. She reports that she does not drink alcohol or use drugs.  Tobacco Cessation: Former smoker with a 40 pack year smoking history. Quit 1990  Past surgical hx, Family hx, Social hx all reviewed.  Current Outpatient Medications on File Prior to Visit  Medication Sig  . amoxicillin-clavulanate (AUGMENTIN) 875-125 MG tablet Take 1 tablet by mouth 2 (two) times daily.  [name] aspirin EC 81 MG tablet Take 1 tablet (81 mg total) by mouth daily.  . cetirizine (ZYRTEC) 10 MG tablet Take 10 mg by mouth daily after breakfast.   . esomeprazole (NEXIUM) 40 MG capsule Take 40 mg by mouth daily.   . fluticasone (FLONASE) 50 MCG/ACT nasal spray Place 1 spray into the nose daily as needed for allergies.   [name] guaiFENesin (MUCINEX) 600 MG 12 hr tablet Take 600 mg by mouth daily as needed  for to loosen phlegm.   [name] HYDROcodone-acetaminophen (NORCO/VICODIN) 5-325 MG tablet Take 1 tablet by mouth every 6 (six) hours as needed. (Patient taking differently: Take 0.5 tablets by mouth every 6 (six) hours as needed. )  . imipramine (TOFRANIL) 25 MG tablet Take 75 mg by mouth at bedtime.   . insulin lispro (HUMALOG KWIKPEN) 100 UNIT/ML KiwkPen Inject 4-6 Units into the skin 3 (three) times daily with meals. Inject 6 units subcutaneously with breakfast and lunch, inject 5 units with supper  . levothyroxine (SYNTHROID, LEVOTHROID) 100 MCG tablet Take 100 mcg by mouth daily before breakfast.  . montelukast (SINGULAIR) 10 MG tablet Take 10 mg by mouth daily with breakfast.  . predniSONE (DELTASONE) 5 MG tablet Take 1 tablet (5 mg total) by mouth daily with breakfast.  . triazolam (HALCION) 0.25 MG tablet  Take 0.25 mg by mouth at bedtime as needed for sleep. For sleep   No current facility-administered medications on file prior to visit.      Allergies  Allergen Reactions  . Lantus [Insulin Glargine]     Patient states "sulfate binder causes sinus infection/pneumonia".  Mack Hook [Levofloxacin In D5w] Other (See Comments)    Muscle aches  . Maxidex [Dexamethasone] Other (See Comments)    Insomnia, anxiety, dizziness  . Metoprolol-Hydrochlorothiazide Other (See Comments)     decreased bp  . Norvasc [Amlodipine] Other (See Comments)    BIL Ankle edema  . Novolog [Insulin Aspart]     Patient states "sulfate binder causes sinus infection/pneumonia". Tolerates Humalog.  [name] Penciclovir     Other reaction(s): Muscle Pain  . Sulfonamide Derivatives Hives  . Titanium Other (See Comments)    Neck wouldn't heal with titanium cervical plate  . Adhesive [Tape] Hives and Rash    Review Of Systems:  Constitutional:   No  weight loss, night sweats, + Fevers, chills,+ fatigue, or  lassitude.  HEENT:   No headaches,  Difficulty swallowing,  Tooth/dental problems, or  Sore throat,                 No sneezing, itching, ear ache, +nasal congestion,+ post nasal drip,   CV:  No chest pain,  Orthopnea, PND, swelling in lower extremities, anasarca, dizziness, palpitations, syncope.   GI  No heartburn, indigestion, abdominal pain, nausea, vomiting, diarrhea, change in bowel habits, +loss of appetite, bloody stools.   Resp: No shortness of breath with exertion or at rest.  No excess mucus, + productive cough,  + non-productive cough,  No coughing up of blood.  No change in color of mucus.  No wheezing.  No chest wall deformity, + chest congestion  Skin: no rash or lesions.  GU: no dysuria, change in color of urine, no urgency or frequency.  No flank pain, no hematuria   MS:  No joint pain or swelling.  No decreased range of motion.  No back pain.  Psych:  No change in mood or affect. No depression or anxiety.  No memory loss.   Vital Signs BP 112/60 (BP Location: Left Arm, Cuff Size: Normal)   Pulse 77   Ht 5\' 2"  (1.575 m)   Wt 131 lb (59.4 kg)   SpO2 96%   BMI 23.96 kg/m    Physical Exam:  General- No distress,  A&Ox3, frail elderly female in a wheel chair ENT: No sinus tenderness, TM clear, pale nasal mucosa, no oral exudate,+ post nasal drip,+  LAN Cardiac: S1, S2, regular rate and rhythm, no murmur Chest: No wheeze/ rales/ dullness; no accessory muscle use, no nasal flaring, no sternal retractions, diminished per RUL and bases Abd.: Soft Non-tender, non-distended Ext: No clubbing cyanosis, edema Neuro:  Deconditioned at baseline Skin: No rashes, warm and dry, thin , frail Psych: normal mood and behavior   Assessment/Plan  CAP (community acquired pneumonia) Per CXR 06/27/2017, RUL Concern for micro aspiration of sinus congestion Allergy to Levaquin Allergy to Sulfa Started Augmentin 12/30 with continued worsening Plan: Sputum culture now CBC with sed rate now BMET now Throat culture now. Stop your augmentin. Doxycycline 100 mg twice daily x 10 days. Take  probiotic with antibiotic Depo injection 80 mg  now 06/28/2017>> start Prednisone 20 mg twice daily x 5 days Prednisone 10 mg twice daily x 5 days 10 mg daily until return. Fluids and eat as much as possible to  help with strength. Follow up with Dr. Halford Chessman or [name] in 2-3 weeks Call immediately if your develop diarrhea Please contact office for sooner follow up if symptoms do not improve or worsen or seek emergency care       Magdalen Spatz, NP 06/28/2017  8:58 PM

## 2017-06-27 NOTE — Telephone Encounter (Signed)
Talked to labs regarding strep lab order Order has been corrected Nothing further needed

## 2017-06-28 ENCOUNTER — Encounter: Payer: Self-pay | Admitting: Acute Care

## 2017-06-28 NOTE — Assessment & Plan Note (Addendum)
Per CXR 06/27/2017, RUL Concern for micro aspiration of sinus congestion Allergy to Levaquin Allergy to Sulfa Started Augmentin 12/30 with continued worsening Plan: Sputum culture now CBC with sed rate now BMET now Throat culture now. Stop your augmentin. Doxycycline 100 mg twice daily x 10 days. Take probiotic with antibiotic Depo injection 80 mg  now 06/28/2017>> start Prednisone 20 mg twice daily x 5 days Prednisone 10 mg twice daily x 5 days 10 mg daily until return. Fluids and eat as much as possible to help with strength. Follow up with Dr. Halford Chessman or [name] in 2-3 weeks Call immediately if your develop diarrhea Please contact office for sooner follow up if symptoms do not improve or worsen or seek emergency care

## 2017-06-30 LAB — RESPIRATORY CULTURE OR RESPIRATORY AND SPUTUM CULTURE
MICRO NUMBER:: 90003283
RESULT:: NORMAL
SPECIMEN QUALITY:: ADEQUATE

## 2017-07-09 ENCOUNTER — Inpatient Hospital Stay (HOSPITAL_COMMUNITY)
Admission: EM | Admit: 2017-07-09 | Discharge: 2017-07-11 | DRG: 190 | Disposition: A | Discharge status: Home / Self Care | Payer: Medicare Other | Attending: Internal Medicine | Admitting: Internal Medicine

## 2017-07-09 ENCOUNTER — Emergency Department (HOSPITAL_COMMUNITY): Payer: Medicare Other

## 2017-07-09 ENCOUNTER — Other Ambulatory Visit: Payer: Self-pay

## 2017-07-09 ENCOUNTER — Encounter (HOSPITAL_COMMUNITY): Payer: Self-pay

## 2017-07-09 DIAGNOSIS — R0602 Shortness of breath: Secondary | ICD-10-CM | POA: Diagnosis not present

## 2017-07-09 DIAGNOSIS — K589 Irritable bowel syndrome without diarrhea: Secondary | ICD-10-CM | POA: Diagnosis present

## 2017-07-09 DIAGNOSIS — E119 Type 2 diabetes mellitus without complications: Secondary | ICD-10-CM

## 2017-07-09 DIAGNOSIS — Z79899 Other long term (current) drug therapy: Secondary | ICD-10-CM | POA: Diagnosis not present

## 2017-07-09 DIAGNOSIS — F32A Depression, unspecified: Secondary | ICD-10-CM | POA: Diagnosis present

## 2017-07-09 DIAGNOSIS — Z87891 Personal history of nicotine dependence: Secondary | ICD-10-CM

## 2017-07-09 DIAGNOSIS — F329 Major depressive disorder, single episode, unspecified: Secondary | ICD-10-CM | POA: Diagnosis present

## 2017-07-09 DIAGNOSIS — R0902 Hypoxemia: Secondary | ICD-10-CM

## 2017-07-09 DIAGNOSIS — J439 Emphysema, unspecified: Secondary | ICD-10-CM | POA: Diagnosis not present

## 2017-07-09 DIAGNOSIS — Z8673 Personal history of transient ischemic attack (TIA), and cerebral infarction without residual deficits: Secondary | ICD-10-CM

## 2017-07-09 DIAGNOSIS — J9601 Acute respiratory failure with hypoxia: Secondary | ICD-10-CM | POA: Diagnosis present

## 2017-07-09 DIAGNOSIS — J449 Chronic obstructive pulmonary disease, unspecified: Secondary | ICD-10-CM | POA: Diagnosis not present

## 2017-07-09 DIAGNOSIS — H548 Legal blindness, as defined in USA: Secondary | ICD-10-CM | POA: Diagnosis present

## 2017-07-09 DIAGNOSIS — Z7951 Long term (current) use of inhaled steroids: Secondary | ICD-10-CM

## 2017-07-09 DIAGNOSIS — T380X5A Adverse effect of glucocorticoids and synthetic analogues, initial encounter: Secondary | ICD-10-CM | POA: Diagnosis present

## 2017-07-09 DIAGNOSIS — Z823 Family history of stroke: Secondary | ICD-10-CM

## 2017-07-09 DIAGNOSIS — M797 Fibromyalgia: Secondary | ICD-10-CM | POA: Diagnosis present

## 2017-07-09 DIAGNOSIS — D72828 Other elevated white blood cell count: Secondary | ICD-10-CM | POA: Diagnosis present

## 2017-07-09 DIAGNOSIS — N390 Urinary tract infection, site not specified: Secondary | ICD-10-CM | POA: Diagnosis present

## 2017-07-09 DIAGNOSIS — J209 Acute bronchitis, unspecified: Secondary | ICD-10-CM | POA: Diagnosis present

## 2017-07-09 DIAGNOSIS — R404 Transient alteration of awareness: Secondary | ICD-10-CM | POA: Diagnosis not present

## 2017-07-09 DIAGNOSIS — Z8249 Family history of ischemic heart disease and other diseases of the circulatory system: Secondary | ICD-10-CM

## 2017-07-09 DIAGNOSIS — Z7982 Long term (current) use of aspirin: Secondary | ICD-10-CM | POA: Diagnosis not present

## 2017-07-09 DIAGNOSIS — J189 Pneumonia, unspecified organism: Secondary | ICD-10-CM

## 2017-07-09 DIAGNOSIS — D638 Anemia in other chronic diseases classified elsewhere: Secondary | ICD-10-CM | POA: Diagnosis not present

## 2017-07-09 DIAGNOSIS — K219 Gastro-esophageal reflux disease without esophagitis: Secondary | ICD-10-CM | POA: Diagnosis present

## 2017-07-09 DIAGNOSIS — Z79890 Hormone replacement therapy (postmenopausal): Secondary | ICD-10-CM

## 2017-07-09 DIAGNOSIS — Z7952 Long term (current) use of systemic steroids: Secondary | ICD-10-CM | POA: Diagnosis not present

## 2017-07-09 DIAGNOSIS — R531 Weakness: Secondary | ICD-10-CM | POA: Diagnosis not present

## 2017-07-09 DIAGNOSIS — J8489 Other specified interstitial pulmonary diseases: Secondary | ICD-10-CM | POA: Diagnosis not present

## 2017-07-09 DIAGNOSIS — Z8541 Personal history of malignant neoplasm of cervix uteri: Secondary | ICD-10-CM

## 2017-07-09 DIAGNOSIS — Z833 Family history of diabetes mellitus: Secondary | ICD-10-CM

## 2017-07-09 DIAGNOSIS — E785 Hyperlipidemia, unspecified: Secondary | ICD-10-CM | POA: Diagnosis present

## 2017-07-09 DIAGNOSIS — Z794 Long term (current) use of insulin: Secondary | ICD-10-CM

## 2017-07-09 DIAGNOSIS — R0600 Dyspnea, unspecified: Secondary | ICD-10-CM | POA: Diagnosis not present

## 2017-07-09 DIAGNOSIS — I272 Pulmonary hypertension, unspecified: Secondary | ICD-10-CM | POA: Diagnosis present

## 2017-07-09 DIAGNOSIS — R7989 Other specified abnormal findings of blood chemistry: Secondary | ICD-10-CM | POA: Diagnosis not present

## 2017-07-09 DIAGNOSIS — M349 Systemic sclerosis, unspecified: Secondary | ICD-10-CM

## 2017-07-09 DIAGNOSIS — J440 Chronic obstructive pulmonary disease with acute lower respiratory infection: Secondary | ICD-10-CM | POA: Diagnosis present

## 2017-07-09 DIAGNOSIS — E860 Dehydration: Secondary | ICD-10-CM | POA: Diagnosis not present

## 2017-07-09 DIAGNOSIS — I10 Essential (primary) hypertension: Secondary | ICD-10-CM | POA: Diagnosis not present

## 2017-07-09 DIAGNOSIS — E039 Hypothyroidism, unspecified: Secondary | ICD-10-CM | POA: Diagnosis not present

## 2017-07-09 DIAGNOSIS — Z515 Encounter for palliative care: Secondary | ICD-10-CM | POA: Diagnosis not present

## 2017-07-09 DIAGNOSIS — E871 Hypo-osmolality and hyponatremia: Secondary | ICD-10-CM

## 2017-07-09 DIAGNOSIS — J441 Chronic obstructive pulmonary disease with (acute) exacerbation: Secondary | ICD-10-CM | POA: Diagnosis not present

## 2017-07-09 DIAGNOSIS — Z90710 Acquired absence of both cervix and uterus: Secondary | ICD-10-CM

## 2017-07-09 DIAGNOSIS — I2510 Atherosclerotic heart disease of native coronary artery without angina pectoris: Secondary | ICD-10-CM | POA: Diagnosis present

## 2017-07-09 DIAGNOSIS — M3500 Sicca syndrome, unspecified: Secondary | ICD-10-CM | POA: Diagnosis present

## 2017-07-09 DIAGNOSIS — H3530 Unspecified macular degeneration: Secondary | ICD-10-CM | POA: Diagnosis present

## 2017-07-09 DIAGNOSIS — I252 Old myocardial infarction: Secondary | ICD-10-CM

## 2017-07-09 DIAGNOSIS — G4730 Sleep apnea, unspecified: Secondary | ICD-10-CM | POA: Diagnosis present

## 2017-07-09 LAB — I-STAT TROPONIN, ED: Troponin i, poc: 0 ng/mL (ref 0.00–0.08)

## 2017-07-09 LAB — BRAIN NATRIURETIC PEPTIDE: B Natriuretic Peptide: 39.5 pg/mL (ref 0.0–100.0)

## 2017-07-09 LAB — BASIC METABOLIC PANEL
Anion gap: 10 (ref 5–15)
BUN: 19 mg/dL (ref 6–20)
CO2: 22 mmol/L (ref 22–32)
Calcium: 8.1 mg/dL — ABNORMAL LOW (ref 8.9–10.3)
Chloride: 98 mmol/L — ABNORMAL LOW (ref 101–111)
Creatinine, Ser: 0.68 mg/dL (ref 0.44–1.00)
GFR calc Af Amer: >60 mL/min (ref >60)
GFR calc non Af Amer: >60 mL/min (ref >60)
Glucose, Bld: 198 mg/dL — ABNORMAL HIGH (ref 65–99)
Potassium: 3.7 mmol/L (ref 3.5–5.1)
Sodium: 130 mmol/L — ABNORMAL LOW (ref 135–145)

## 2017-07-09 LAB — URINALYSIS, ROUTINE W REFLEX MICROSCOPIC
Bilirubin Urine: NEGATIVE
Glucose, UA: NEGATIVE mg/dL
Ketones, ur: NEGATIVE mg/dL
Nitrite: NEGATIVE
Protein, ur: NEGATIVE mg/dL
Specific Gravity, Urine: 1.014 (ref 1.005–1.030)
pH: 7 (ref 5.0–8.0)

## 2017-07-09 LAB — CBC
HCT: 40.3 % (ref 36.0–46.0)
Hemoglobin: 13.7 g/dL (ref 12.0–15.0)
MCH: 29 pg (ref 26.0–34.0)
MCHC: 34 g/dL (ref 30.0–36.0)
MCV: 85.2 fL (ref 78.0–100.0)
Platelets: 220 10*3/uL (ref 150–400)
RBC: 4.73 MIL/uL (ref 3.87–5.11)
RDW: 13.6 % (ref 11.5–15.5)
WBC: 16.2 10*3/uL — ABNORMAL HIGH (ref 4.0–10.5)

## 2017-07-09 LAB — D-DIMER, QUANTITATIVE (NOT AT ARMC): D-Dimer, Quant: 0.83 ug/mL-FEU — ABNORMAL HIGH (ref 0.00–0.50)

## 2017-07-09 LAB — CBG MONITORING, ED
Glucose-Capillary: 205 mg/dL — ABNORMAL HIGH (ref 65–99)
Glucose-Capillary: 252 mg/dL — ABNORMAL HIGH (ref 65–99)

## 2017-07-09 LAB — TROPONIN I: Troponin I: <0.03 ng/mL (ref <0.03)

## 2017-07-09 LAB — GLUCOSE, CAPILLARY: Glucose-Capillary: 261 mg/dL — ABNORMAL HIGH (ref 65–99)

## 2017-07-09 MED ORDER — INSULIN ASPART 100 UNIT/ML ~~LOC~~ SOLN: 0.0000 [IU] | Freq: Every day | SUBCUTANEOUS | Status: DC

## 2017-07-09 MED ORDER — ONDANSETRON HCL 4 MG PO TABS
4.0000 mg | ORAL_TABLET | Freq: Once | ORAL | Status: DC
  Filled 2017-07-09: qty 1

## 2017-07-09 MED ORDER — SODIUM CHLORIDE 0.9% FLUSH: 3.0000 mL | INTRAVENOUS | Status: DC | PRN

## 2017-07-09 MED ORDER — ALBUTEROL SULFATE (2.5 MG/3ML) 0.083% IN NEBU: 2.5000 mg | INHALATION_SOLUTION | RESPIRATORY_TRACT | Status: DC | PRN

## 2017-07-09 MED ORDER — PREDNISONE 20 MG PO TABS
40.0000 mg | ORAL_TABLET | Freq: Every day | ORAL | Status: DC
  Administered 2017-07-10: 40 mg via ORAL
  Filled 2017-07-09: qty 2

## 2017-07-09 MED ORDER — PANTOPRAZOLE SODIUM 40 MG PO TBEC: 80.0000 mg | DELAYED_RELEASE_TABLET | Freq: Every day | ORAL | Status: DC

## 2017-07-09 MED ORDER — DEXTROSE 5 % IV SOLN
1.0000 g | Freq: Once | INTRAVENOUS | Status: AC
  Administered 2017-07-09: 1 g via INTRAVENOUS
  Filled 2017-07-09: qty 10

## 2017-07-09 MED ORDER — TRIAZOLAM 0.25 MG PO TABS
0.2500 mg | ORAL_TABLET | Freq: Every evening | ORAL | Status: DC | PRN
  Administered 2017-07-10 (×2): 0.25 mg via ORAL
  Filled 2017-07-09 (×2): qty 1

## 2017-07-09 MED ORDER — IPRATROPIUM-ALBUTEROL 0.5-2.5 (3) MG/3ML IN SOLN
3.0000 mL | Freq: Two times a day (BID) | RESPIRATORY_TRACT | Status: DC
  Administered 2017-07-10: 3 mL via RESPIRATORY_TRACT
  Filled 2017-07-09 (×2): qty 3

## 2017-07-09 MED ORDER — SODIUM CHLORIDE 0.9% FLUSH
3.0000 mL | Freq: Two times a day (BID) | INTRAVENOUS | Status: DC
  Administered 2017-07-10 (×2): 3 mL via INTRAVENOUS

## 2017-07-09 MED ORDER — BUDESONIDE 0.5 MG/2ML IN SUSP
0.5000 mg | Freq: Two times a day (BID) | RESPIRATORY_TRACT | Status: DC
  Administered 2017-07-09 – 2017-07-11 (×3): 0.5 mg via RESPIRATORY_TRACT
  Filled 2017-07-09 (×4): qty 2

## 2017-07-09 MED ORDER — DEXTROSE 5 % IV SOLN
2.0000 g | Freq: Two times a day (BID) | INTRAVENOUS | Status: DC
  Administered 2017-07-10: 2 g via INTRAVENOUS
  Filled 2017-07-09 (×2): qty 2

## 2017-07-09 MED ORDER — ALBUTEROL SULFATE (2.5 MG/3ML) 0.083% IN NEBU
5.0000 mg | INHALATION_SOLUTION | Freq: Once | RESPIRATORY_TRACT | Status: AC
  Administered 2017-07-09: 5 mg via RESPIRATORY_TRACT
  Filled 2017-07-09: qty 6

## 2017-07-09 MED ORDER — IPRATROPIUM-ALBUTEROL 0.5-2.5 (3) MG/3ML IN SOLN
3.0000 mL | Freq: Four times a day (QID) | RESPIRATORY_TRACT | Status: DC
  Filled 2017-07-09: qty 3

## 2017-07-09 MED ORDER — SODIUM CHLORIDE 0.9 % IV SOLN: 250.0000 mL | INTRAVENOUS | Status: DC | PRN

## 2017-07-09 MED ORDER — LORATADINE 10 MG PO TABS
10.0000 mg | ORAL_TABLET | Freq: Every day | ORAL | Status: DC
  Administered 2017-07-10 – 2017-07-11 (×2): 10 mg via ORAL
  Filled 2017-07-09 (×2): qty 1

## 2017-07-09 MED ORDER — INSULIN ASPART 100 UNIT/ML ~~LOC~~ SOLN: 0.0000 [IU] | Freq: Three times a day (TID) | SUBCUTANEOUS | Status: DC

## 2017-07-09 MED ORDER — METHYLPREDNISOLONE SODIUM SUCC 40 MG IJ SOLR
40.0000 mg | Freq: Two times a day (BID) | INTRAMUSCULAR | Status: DC
  Administered 2017-07-09 – 2017-07-11 (×4): 40 mg via INTRAVENOUS
  Filled 2017-07-09 (×4): qty 1

## 2017-07-09 MED ORDER — SODIUM CHLORIDE 0.9 % IV BOLUS (SEPSIS)
1000.0000 mL | Freq: Once | INTRAVENOUS | Status: AC
  Administered 2017-07-09: 1000 mL via INTRAVENOUS

## 2017-07-09 MED ORDER — HYDROCODONE-ACETAMINOPHEN 5-325 MG PO TABS: 0.5000 | ORAL_TABLET | Freq: Four times a day (QID) | ORAL | Status: DC | PRN

## 2017-07-09 MED ORDER — IPRATROPIUM-ALBUTEROL 0.5-2.5 (3) MG/3ML IN SOLN: 3.0000 mL | RESPIRATORY_TRACT | Status: DC | PRN

## 2017-07-09 MED ORDER — FLUTICASONE PROPIONATE 50 MCG/ACT NA SUSP
2.0000 | Freq: Every day | NASAL | Status: DC
  Filled 2017-07-09: qty 16

## 2017-07-09 MED ORDER — IPRATROPIUM-ALBUTEROL 0.5-2.5 (3) MG/3ML IN SOLN
3.0000 mL | Freq: Once | RESPIRATORY_TRACT | Status: AC
  Administered 2017-07-09: 3 mL via RESPIRATORY_TRACT
  Filled 2017-07-09: qty 3

## 2017-07-09 MED ORDER — ENOXAPARIN SODIUM 40 MG/0.4ML ~~LOC~~ SOLN
40.0000 mg | Freq: Every day | SUBCUTANEOUS | Status: DC
  Administered 2017-07-10 (×2): 40 mg via SUBCUTANEOUS
  Filled 2017-07-09 (×2): qty 0.4

## 2017-07-09 MED ORDER — ASPIRIN EC 81 MG PO TBEC
81.0000 mg | DELAYED_RELEASE_TABLET | Freq: Every day | ORAL | Status: DC
  Administered 2017-07-10 – 2017-07-11 (×2): 81 mg via ORAL
  Filled 2017-07-09 (×2): qty 1

## 2017-07-09 MED ORDER — ACETAMINOPHEN 325 MG PO TABS: 650.0000 mg | ORAL_TABLET | ORAL | Status: DC | PRN

## 2017-07-09 MED ORDER — ONDANSETRON HCL 4 MG/2ML IJ SOLN: 4.0000 mg | Freq: Four times a day (QID) | INTRAMUSCULAR | Status: DC | PRN

## 2017-07-09 MED ORDER — IMIPRAMINE HCL 25 MG PO TABS
75.0000 mg | ORAL_TABLET | Freq: Every day | ORAL | Status: DC
  Administered 2017-07-10 (×2): 75 mg via ORAL
  Filled 2017-07-09 (×2): qty 3

## 2017-07-09 MED ORDER — LEVOTHYROXINE SODIUM 100 MCG PO TABS
100.0000 ug | ORAL_TABLET | Freq: Every day | ORAL | Status: DC
  Administered 2017-07-10 – 2017-07-11 (×2): 100 ug via ORAL
  Filled 2017-07-09 (×2): qty 1

## 2017-07-09 MED ORDER — IOPAMIDOL (ISOVUE-370) INJECTION 76%
INTRAVENOUS | Status: AC
  Administered 2017-07-09: 100 mL
  Filled 2017-07-09: qty 100

## 2017-07-09 MED ORDER — GUAIFENESIN ER 600 MG PO TB12
1200.0000 mg | ORAL_TABLET | Freq: Two times a day (BID) | ORAL | Status: DC
  Administered 2017-07-10 – 2017-07-11 (×4): 1200 mg via ORAL
  Filled 2017-07-09 (×4): qty 2

## 2017-07-09 MED ORDER — DEXTROSE 5 % IV SOLN
500.0000 mg | Freq: Every day | INTRAVENOUS | Status: DC
  Administered 2017-07-10 (×2): 500 mg via INTRAVENOUS
  Filled 2017-07-09 (×3): qty 500

## 2017-07-09 NOTE — ED Notes (Signed)
Pt is expressing discontent with the wait time to for admission/getting a bed upstairs. Have explained the process of waiting to get a bed. Will try to find out information for pt.

## 2017-07-09 NOTE — ED Triage Notes (Signed)
Pt BIB EMS from home for increasing generalized weakness and shortness of breath. Per EMS- pt diagnosed with pna on 1/2, d/c with doxycycline. Completed full course on Friday. Hx of emphysema, has hospice nurse that sees her for recurrent pna. A/Ox4.

## 2017-07-09 NOTE — H&P (Addendum)
History and Physical    [name] QQV:[id number] DOB: [birth year] DOA: 07/09/2017  PCP: Merrilee Seashore, MD  Patient coming from: Home  I have personally briefly reviewed patient's old medical records in [place]  Chief Complaint: Shortness of breath/do not feel well  HPI: [name] is a 84 y.o. female with medical history significant of asthma, Sjogren's syndrome, prior history of abnormal CT chest with concerns for [place] in the setting of emphysema on chronic prednisone presented to the ED with a 3-day history of worsening shortness of breath on minimal exertion, did not feel good, decreased energy, decreased oral intake.  Patient states was diagnosed with a pneumonia on June 22, 2017 per chest x-ray at which point in time she was treated with Augmentin with a full course with no significant improvement.  Patient saw her pulmonologist on June 27, 2016 with similar symptoms subsequently placed on doxycycline.  Patient stated was doing well took the full course of doxycycline and when she finished it his symptoms returned and have been worsening for the past 2 days prior to admission.  Patient denies any nausea, no vomiting, no abdominal pain, no constipation, no cough, no melena, no hematemesis, no hematochezia, no hemoptysis.  Patient does endorse a generalized weakness.  Patient stated she called her hospice nurse who brought the doctor to see her and patient was sent to the emergency room.  ED Course: In the ED basic metabolic profile done had a sodium of 130 chloride of 98 glucose of 198 otherwise was within normal limits.  Point-of-care troponin was negative.  D-dimer was elevated at 0.83.  His x-ray done showed resolution of right upper lobe infiltrate.  No acute abnormalities.  Aortic atherosclerosis.  CBC done had a white count of 16.2 hemoglobin of 13.7 platelet count of 220 otherwise was within normal limits.  Patient also noted to be on steroids prior to  admission.  Urinalysis done had large leukocytes negative nitrite too numerous to count WBCs.  CT angiogram chest done was negative for an acute pulmonary embolus.  Mild groundglass density in the posterior right upper lobe could relate to minimal residual inflammatory or infiltrate.  Slightly nodular density in lateral right upper lobe suspect that this also represents residual nodular infiltrate.  Patient was given albuterol nebulizer treatment, IV Rocephin x1.  [facility] were called to admit the patient for further evaluation and management.  Also noted in the ED to be hypoxic on ambulation minimally with sats dropping in the 70s.  Patient not on chronic O2 at home.  Review of Systems: As per HPI otherwise 10 point review of systems negative.   Past Medical History:  Diagnosis Date  . Adenomatous colon polyp   . Arthritis   . Blood transfusion   . Bronchitis   . Cancer [facility])    cervical cancer pre hysterectomy  . Cervical disc disease   . Chronic sinusitis   . CONSTIPATION, CHRONIC 06/26/2007   Qualifier: Diagnosis of  By: Nils Pyle CMA ([place]), Mearl Latin    . COPD (chronic obstructive pulmonary disease) ([place])   . Coronary artery disease   . CVA (cerebral infarction) 1991  . Depression   . Diabetes mellitus   . Diverticulosis   . 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 Dyslipidemia   . Emphysema   . Fibromyalgia   . GERD (gastroesophageal reflux disease)   . Hypertension   . Hypothyroidism   . IBS (irritable bowel syndrome)   . Legally blind   .  Macular degeneration   . Myocardial infarction [facility]) may 27th 2007  . Pneumonia 07/16/2013    HX PNEUMONIA  . Pneumonia   . Pulmonary hypertension ([place])   . Raynaud's phenomenon   . Scleroderma ([place])   . Sjogren's disease ([place])   . Sleep apnea    STOP BANG SCORE 5  . Stroke [facility]) 1990   brain stem stroke - weakness rt hand  . Zenker's diverticulum     Past Surgical History:  Procedure Laterality Date  . APPENDECTOMY    . BALLOON  DILATION N/A 07/15/2015   Procedure: BALLOON DILATION;  Surgeon: Milus Banister, MD;  Location: [facility];  Service: Endoscopy;  Laterality: N/A;  . BILATERAL OOPHORECTOMY  2002  . BRONCHOSCOPY  02-22-09  . CARDIAC CATHETERIZATION     x 3  . CATARACT EXTRACTION    . CHOLECYSTECTOMY  1965  . COLON SURGERY  2014   colon resection  . colonectomy    . ESOPHAGOGASTRODUODENOSCOPY (EGD) WITH ESOPHAGEAL DILATION N/A 06/18/2013   Procedure: ESOPHAGOGASTRODUODENOSCOPY (EGD) WITH ESOPHAGEAL DILATION;  Surgeon: Inda Castle, MD;  Location: [place];  Service: Endoscopy;  Laterality: N/A;  . FLEXIBLE SIGMOIDOSCOPY N/A 07/15/2015   Procedure: FLEXIBLE SIGMOIDOSCOPY;  Surgeon: Milus Banister, MD;  Location: [facility];  Service: Endoscopy;  Laterality: N/A;  . lobe thyroid removal    . PROCTOSCOPY  03/18/2012   Procedure: PROCTOSCOPY;  Surgeon: Adin Hector, MD;  Location: [facility];  Service: General;  Laterality: N/A;  rigid procto  . RADIOLOGY WITH ANESTHESIA N/A 03/09/2015   Procedure: MRI CERVICAL SPINE WITHOUT AND LUMBER WITHOUT;  Surgeon: Medication Radiologist, MD;  Location: [place];  Service: Radiology;  Laterality: N/A;  . [place]   and implantation of a plate   . STENTED CARDIAC  ARTERY  2007 / 2007 / 2010   X 3 STENT  . THYROID LOBECTOMY  1991   removal of left lobe thyroid  . TOTAL ABDOMINAL HYSTERECTOMY  1973     reports that she quit smoking about 29 years ago. Her smoking use included cigarettes. She has a 40.00 pack-year smoking history. she has never used smokeless tobacco. She reports that she does not drink alcohol or use drugs.  Allergies  Allergen Reactions  . Lantus [Insulin Glargine]     Patient states "sulfate binder causes sinus infection/pneumonia".  Mack Hook [Levofloxacin In D5w] Other (See Comments)    Muscle aches  . Maxidex [Dexamethasone] Other (See Comments)    Insomnia, anxiety, dizziness  . Metoprolol-Hydrochlorothiazide Other (See  Comments)     decreased bp  . Norvasc [Amlodipine] Other (See Comments)    BIL Ankle edema  . Novolog [Insulin Aspart]     Patient states "sulfate binder causes sinus infection/pneumonia". Tolerates Humalog.  [name] Penciclovir     Other reaction(s): Muscle Pain  . Senna [Sennosides] Other (See Comments)    Pt states that it causes a stinging sensation  . Sulfonamide Derivatives Hives  . Titanium Other (See Comments)    Neck wouldn't heal with titanium cervical plate  . Adhesive [Tape] Hives and Rash    Family History  Problem Relation Age of Onset  . Other Mother 31       Died from sepsis  . Stroke Father 45       died  . Heart disease Father   . Diabetes Father   . Cancer Sister        lung  . Diabetes Sister   .  Cancer Brother        lung  . Diabetes Brother   . Diabetes Brother    Unacceptable: Noncontributory, unremarkable, or negative. Acceptable: Family history reviewed and not pertinent (If you reviewed it)  Prior to Admission medications   Medication Sig Start Date End Date Taking? Authorizing Provider  amoxicillin-clavulanate (AUGMENTIN) 875-125 MG tablet Take 1 tablet by mouth 2 (two) times daily.    [provider]  aspirin EC 81 MG tablet Take 1 tablet (81 mg total) by mouth daily. 11/18/13   Fay Records, MD  cetirizine (ZYRTEC) 10 MG tablet Take 10 mg by mouth daily after breakfast.     [provider]  doxycycline (VIBRA-TABS) 100 MG tablet Take 1 tablet (100 mg total) by mouth 2 (two) times daily. 06/27/17   Magdalen Spatz, NP  esomeprazole (NEXIUM) 40 MG capsule Take 40 mg by mouth daily.     [provider]  fluticasone (FLONASE) 50 MCG/ACT nasal spray Place 1 spray into the nose daily as needed for allergies.  06/04/12   [provider]  guaiFENesin (MUCINEX) 600 MG 12 hr tablet Take 600 mg by mouth daily as needed for to loosen phlegm.     [provider]  HYDROcodone-acetaminophen (NORCO/VICODIN) 5-325 MG tablet  Take 1 tablet by mouth every 6 (six) hours as needed. Patient taking differently: Take 0.5 tablets by mouth every 6 (six) hours as needed.  06/14/16   Montine Circle, PA-C  imipramine (TOFRANIL) 25 MG tablet Take 75 mg by mouth at bedtime.  05/26/09   [provider]  insulin lispro (HUMALOG KWIKPEN) 100 UNIT/ML KiwkPen Inject 4-6 Units into the skin 3 (three) times daily with meals. Inject 6 units subcutaneously with breakfast and lunch, inject 5 units with supper    [provider]  levothyroxine (SYNTHROID, LEVOTHROID) 100 MCG tablet Take 100 mcg by mouth daily before breakfast.    [provider]  montelukast (SINGULAIR) 10 MG tablet Take 10 mg by mouth daily with breakfast. 05/10/15   [provider]  predniSONE (DELTASONE) 10 MG tablet Take 2 tablets twice daily for 5 days, then take 1 tablet twice daily for 5 days 06/27/17   Magdalen Spatz, NP  predniSONE (DELTASONE) 5 MG tablet Take 1 tablet (5 mg total) by mouth daily with breakfast. 07/19/16   Chesley Mires, MD  triazolam (HALCION) 0.25 MG tablet Take 0.25 mg by mouth at bedtime as needed for sleep. For sleep    [provider]    Physical Exam: Vitals:   07/09/17 1530 07/09/17 1600 07/09/17 1630 07/09/17 1702  BP: (!) 122/108 128/70 (!) 122/104 138/75  Pulse: 82 74 77 (!) 43  Resp: (!) 24 18 20 17   Temp:      TempSrc:      SpO2: 96% 100% 99% 93%  Weight:      Height:        Constitutional: NAD, calm, comfortable Vitals:   07/09/17 1530 07/09/17 1600 07/09/17 1630 07/09/17 1702  BP: (!) 122/108 128/70 (!) 122/104 138/75  Pulse: 82 74 77 (!) 43  Resp: (!) 24 18 20 17   Temp:      TempSrc:      SpO2: 96% 100% 99% 93%  Weight:      Height:       Eyes: PERRLA, lids and conjunctivae normal ENMT: Mucous membranes are dry. Posterior pharynx clear of any exudate or lesions. Poor dentition.  Neck: normal, supple, no masses, no thyromegaly  Respiratory: Fair air movement.  Some coarse  breath sounds right upper lobe.  No wheezing.  No crackles noted.  Normal respiratory effort. Cardiovascular: Regular rate and rhythm, no murmurs / rubs / gallops. No extremity edema. 2+ pedal pulses. No carotid bruits.  Abdomen: no tenderness, no masses palpated. No hepatosplenomegaly. Bowel sounds positive.  Musculoskeletal: no clubbing / cyanosis. No joint deformity upper and lower extremities. Good ROM, no contractures. Normal muscle tone.  Skin: no rashes, lesions, ulcers. No induration Neurologic: CN 3-12 grossly intact. Sensation intact, DTR normal. Strength 5/5 in all 4.  Psychiatric: Normal judgment and insight. Alert and oriented x 3. Normal mood.   Labs on Admission: I have personally reviewed following labs and imaging studies  CBC: Recent Labs  Lab 07/09/17 1411  WBC 16.2*  HGB 13.7  HCT 40.3  MCV 85.2  PLT 213   Basic Metabolic Panel: Recent Labs  Lab 07/09/17 1615  NA 130*  K 3.7  CL 98*  CO2 22  GLUCOSE 198*  BUN 19  CREATININE 0.68  CALCIUM 8.1*   GFR: Estimated Creatinine Clearance: 41.4 mL/min (by C-G formula based on SCr of 0.68 mg/dL). Liver Function Tests: No results for input(s): AST, ALT, ALKPHOS, BILITOT, PROT, ALBUMIN in the last 168 hours. No results for input(s): LIPASE, AMYLASE in the last 168 hours. No results for input(s): AMMONIA in the last 168 hours. Coagulation Profile: No results for input(s): INR, PROTIME in the last 168 hours. Cardiac Enzymes: No results for input(s): CKTOTAL, CKMB, CKMBINDEX, TROPONINI in the last 168 hours. BNP (last 3 results) No results for input(s): PROBNP in the last 8760 hours. HbA1C: No results for input(s): HGBA1C in the last 72 hours. CBG: Recent Labs  Lab 07/09/17 1416  GLUCAP 205*   Lipid Profile: No results for input(s): CHOL, HDL, LDLCALC, TRIG, CHOLHDL, LDLDIRECT in the last 72 hours. Thyroid Function Tests: No results for input(s): TSH, T4TOTAL, FREET4, T3FREE, THYROIDAB in the last 72  hours. Anemia Panel: No results for input(s): VITAMINB12, FOLATE, FERRITIN, TIBC, IRON, RETICCTPCT in the last 72 hours. Urine analysis:    Component Value Date/Time   COLORURINE YELLOW 07/09/2017 1332   APPEARANCEUR CLEAR 07/09/2017 1332   LABSPEC 1.014 07/09/2017 1332   PHURINE 7.0 07/09/2017 1332   GLUCOSEU NEGATIVE 07/09/2017 1332   GLUCOSEU NEGATIVE 03/24/2010 1137   HGBUR SMALL (A) 07/09/2017 1332   BILIRUBINUR NEGATIVE 07/09/2017 1332   KETONESUR NEGATIVE 07/09/2017 1332   PROTEINUR NEGATIVE 07/09/2017 1332   UROBILINOGEN 0.2 05/01/2015 2019   NITRITE NEGATIVE 07/09/2017 1332   LEUKOCYTESUR LARGE (A) 07/09/2017 1332    Radiological Exams on Admission: Dg Chest 2 View  Result Date: 07/09/2017 CLINICAL DATA:  Shortness of breath. EXAM: CHEST  2 VIEW COMPARISON:  06/27/2017 FINDINGS: Heart size and pulmonary vascularity are normal and the lungs are clear. The small patchy infiltrate seen in the right upper lobe on the prior study has resolved. No effusions. Aortic atherosclerosis. Coronary artery stents in place. Surgical clips to the left of the trachea, probably due to previous thyroid surgery. IMPRESSION: Resolution of right upper lobe infiltrate.  No acute abnormalities. Aortic atherosclerosis. Electronically Signed   By: Lorriane Shire M.D.   On: 07/09/2017 14:24   Ct Angio Chest Pe W And/or Wo Contrast  Result Date: 07/09/2017 CLINICAL DATA:  Cough, shortness of breath and positive D-dimer EXAM: CT ANGIOGRAPHY CHEST WITH CONTRAST TECHNIQUE: Multidetector CT imaging of the chest was performed using the standard protocol during bolus administration of intravenous  contrast. Multiplanar CT image reconstructions and MIPs were obtained to evaluate the vascular anatomy. CONTRAST:  138mL ISOVUE-370 IOPAMIDOL (ISOVUE-370) INJECTION 76% COMPARISON:  Chest x-ray 07/09/2017, CT chest 06/14/2016 FINDINGS: Cardiovascular: Satisfactory opacification of the pulmonary arteries to the segmental  level. No evidence of pulmonary embolism. Moderate aortic atherosclerosis. No aneurysmal dilatation. Normal heart size. Coronary vessel calcification. No pericardial effusion Mediastinum/Nodes: Midline trachea. No thyroid mass. Surgical changes in the region of left lobe of the thyroid. Esophagus within normal limits. No significantly enlarged mediastinal lymph nodes Lungs/Pleura: No pleural effusion. Mild ground-glass density in the posterior right upper lobe, series 10, image number 34. Mild nodular density in the lateral right upper lobe, series 10, image number 56. Upper Abdomen: No acute abnormality. Musculoskeletal: Degenerative changes. No acute or suspicious lesion Review of the MIP images confirms the above findings. IMPRESSION: 1. Negative for acute pulmonary embolus 2. Mild ground-glass density in the posterior right upper lobe could relate to minimal residual inflammation or infiltrate. Slightly nodular density in the lateral right upper lobe, suspect that this also represents residual nodular infiltrate, however suggest short interval CT follow-up in 6-12 weeks to ensure resolution. Aortic Atherosclerosis (ICD10-I70.0). Electronically Signed   By: Donavan Foil M.D.   On: 07/09/2017 17:27    EKG: Independently reviewed.  Poor R wave progression.  Nonspecific ST-T wave changes.  Assessment/Plan Principal Problem:   Hypoxia Active Problems:   Hyperlipidemia   Depression   Legal blindness   Essential hypertension   Coronary atherosclerosis   Systemic sclerosis (HCC)   Diabetes mellitus (HCC)   COPD (chronic obstructive pulmonary disease) (HCC)   Anemia of chronic disease   CAP (community acquired pneumonia)   Hyponatremia   Hypothyroidism   Dehydration   #1 hypoxia Questionable etiology.  Differential diagnosis:  pulmonary etiology with underlying connective tissue disease versus cardiac etiology.  Patient presented with worsening shortness of breath on minimal exertion after  having been treated 2 times for outpatient pneumonia.  Patient presented with fever at home, shortness of breath on minimal exertion.  No coughing.  CT chest done was negative for PE.  CT chest with areas in the right upper lobe concerning for infiltrate versus inflammation.  Will admit patient to telemetry.  Patient refused ABG.  We will cycle cardiac enzymes every 6 hours x3.  Check a 2D echo.  Check an influenza PCR.  Check a sputum Gram stain and culture.  Check a urine Legionella antigen.  Check a urine pneumococcus antigen.  Check a respiratory viral panel.  Check a BNP.  Will place on O2, scheduled nebulizers, IV Solu-Medrol 40 mg every 12 hours, empiric IV cefepime and azithromycin, Pulmicort.  Strict I's and O's.  Daily weights.  We will give a dose of Lasix 20 mg IV x1.  Will need to consult with pulmonary tomorrow for further evaluation and management.  2.  Probable partially treated community-acquired pneumonia Check a urine Legionella antigen.  Check a urine pneumococcus antigen.  Check an influenza PCR.  Check a respiratory viral panel.  Check a sputum Gram stain and culture.  Patient is status post treatment x2 in the outpatient setting for community-acquired pneumonia with Augmentin and doxycycline however once patient is done with treatment symptoms return with significant hypoxia.  This empirically on IV cefepime and IV azithromycin.  Follow.  3.  Hypothyroidism Check a TSH.  Continue home dose Synthroid.  4.  Anemia of chronic disease Follow H&H.  No overt bleeding.  5.  Hyponatremia Check a  urine sodium, urine creatinine, urine and serum osmolality, TSH.  Follow.  6.  Hypertension Stable.  Follow.  7.  Diabetes mellitus Check a hemoglobin A1c.  Place on sliding scale insulin.  8.  Systemic sclerosis On chronic prednisone.  Will place on IV Solu-Medrol and taper down back to home dose as patient continues to improve.  9.  UTI Check urine cultures.  IV cefepime.  DVT  prophylaxis: Lovenox Code Status: Full Family Communication: Patient and daughter at bedside. Disposition Plan: To be determined.  Pending hospitalization. Consults called: None Admission status: Admit to inpatient.   Irine Seal MD [facility] Pager [phone]  If 7PM-7AM, please contact night-coverage [email or web address] Password [facility]  07/09/2017, 7:12 PM

## 2017-07-09 NOTE — Progress Notes (Signed)
Pt refused ABG

## 2017-07-09 NOTE — ED Notes (Signed)
Bed assigned @ 2152 rm 1403

## 2017-07-09 NOTE — ED Notes (Signed)
Pt c/o shortness of breath more than before and being jittery/shaking. Ptwas assessed and oxygen remains the same. Pt seems anxious at this time and has been assured that oxygen and vitals have remained the same, also that the breathing tx she received can cause shakiness. Pt has been reassessed by MD Verner Chol as well.

## 2017-07-09 NOTE — ED Provider Notes (Signed)
[facility] DEPT Provider Note   CSN: [id number] Arrival date & time: 07/09/17  1202     History   Chief Complaint Chief Complaint  Patient presents with  . Weakness  . Shortness of Breath    HPI [name] is a 84 y.o. female.  HPI  84 year old female presents with concern for pneumonia.  She states she just got off doxycycline about 3 days ago.  Soon as this occurred she had a recurrence of cough and shortness of breath.  She had a fever of 102 at some point but does not remember which day.  Most recently today her temperature was 99.7.  She has had shortness of breath that comes and goes but is always with exertion.  Cough with productive sputum but she is blind and cannot tell what color it is.  No chest pain.  She feels diffusely weak.  She denies any urinary symptoms.  She has had a headache during this time as well.  Past Medical History:  Diagnosis Date  . Adenomatous colon polyp   . Arthritis   . Blood transfusion   . Bronchitis   . Cancer [facility])    cervical cancer pre hysterectomy  . Cervical disc disease   . Chronic sinusitis   . CONSTIPATION, CHRONIC 06/26/2007   Qualifier: Diagnosis of  By: Nils Pyle CMA ([place]), Mearl Latin    . COPD (chronic obstructive pulmonary disease) ([place])   . Coronary artery disease   . CVA (cerebral infarction) 1991  . Depression   . Diabetes mellitus   . Diverticulosis   . 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 Dyslipidemia   . Emphysema   . Fibromyalgia   . GERD (gastroesophageal reflux disease)   . Hypertension   . Hypothyroidism   . IBS (irritable bowel syndrome)   . Legally blind   . Macular degeneration   . Myocardial infarction [facility]) may 27th 2007  . Pneumonia 07/16/2013    HX PNEUMONIA  . Pneumonia   . Pulmonary hypertension ([place])   . Raynaud's phenomenon   . Scleroderma ([place])   . Sjogren's disease ([place])   . Sleep apnea    STOP BANG SCORE 5  . Stroke [facility]) 1990   brain stem stroke - weakness  rt hand  . Zenker's diverticulum     Patient Active Problem List   Diagnosis Date Noted  . Dehydration   . Enteritis 11/17/2016  . Nondisplaced fracture of second metatarsal bone, left foot, subsequent encounter for fracture with routine healing 06/29/2016  . Arthralgia of both lower legs 03/30/2016  . Hypothyroidism 10/01/2015  . HCAP (healthcare-associated pneumonia) 05/16/2015  . Diarrhea 05/02/2015  . Diarrhea with dehydration 05/02/2015  . Hyponatremia 05/02/2015  . Weakness 01/29/2015  . Pain in joint, ankle and foot 11/20/2014  . Acute bronchitis 09/01/2014  . CAP (community acquired pneumonia) 05/26/2014  . Dysphagia, unspecified(787.20) 06/18/2013  . Stricture and stenosis of esophagus 06/18/2013  . Urinary incontinence in female 05/06/2012  . Fibromyalgia   . Chronic confusional state 03/21/2012  . Postoperative anemia 03/21/2012  . Anemia of chronic disease 03/19/2012  . Diabetes mellitus ([place]) 05/30/2011  . Colonic stricture s/p lap sigmoid colectomy 03/18/2012 03/27/2011  . Diverticulosis of colon 03/27/2011  . Systemic sclerosis ([place]) 06/29/2009  . Pure hypercholesterolemia 05/25/2009  . BOOP (bronchiolitis obliterans with organizing pneumonia) ([place]) 02/18/2009  . Chronic rhinitis 07/08/2007  . EMPHYSEMA 07/08/2007  . Hyperlipidemia 06/26/2007  . Depression 06/26/2007  . Legal blindness 06/26/2007  .  Essential hypertension 06/26/2007  . CORONARY ARTERY DISEASE 06/26/2007  . ZENKER'S DIVERTICULUM 06/26/2007  . Irritable bowel syndrome 06/26/2007    Past Surgical History:  Procedure Laterality Date  . APPENDECTOMY    . BALLOON DILATION N/A 07/15/2015   Procedure: BALLOON DILATION;  Surgeon: Milus Banister, MD;  Location: [facility];  Service: Endoscopy;  Laterality: N/A;  . BILATERAL OOPHORECTOMY  2002  . BRONCHOSCOPY  02-22-09  . CARDIAC CATHETERIZATION     x 3  . CATARACT EXTRACTION    . CHOLECYSTECTOMY  1965  . COLON SURGERY  2014   colon  resection  . colonectomy    . ESOPHAGOGASTRODUODENOSCOPY (EGD) WITH ESOPHAGEAL DILATION N/A 06/18/2013   Procedure: ESOPHAGOGASTRODUODENOSCOPY (EGD) WITH ESOPHAGEAL DILATION;  Surgeon: Inda Castle, MD;  Location: [place];  Service: Endoscopy;  Laterality: N/A;  . FLEXIBLE SIGMOIDOSCOPY N/A 07/15/2015   Procedure: FLEXIBLE SIGMOIDOSCOPY;  Surgeon: Milus Banister, MD;  Location: [facility];  Service: Endoscopy;  Laterality: N/A;  . lobe thyroid removal    . PROCTOSCOPY  03/18/2012   Procedure: PROCTOSCOPY;  Surgeon: Adin Hector, MD;  Location: [facility];  Service: General;  Laterality: N/A;  rigid procto  . RADIOLOGY WITH ANESTHESIA N/A 03/09/2015   Procedure: MRI CERVICAL SPINE WITHOUT AND LUMBER WITHOUT;  Surgeon: Medication Radiologist, MD;  Location: [place];  Service: Radiology;  Laterality: N/A;  . [place]   and implantation of a plate   . STENTED CARDIAC  ARTERY  2007 / 2007 / 2010   X 3 STENT  . THYROID LOBECTOMY  1991   removal of left lobe thyroid  . TOTAL ABDOMINAL HYSTERECTOMY  1973    OB History    No data available       Home Medications    Prior to Admission medications   Medication Sig Start Date End Date Taking? Authorizing Provider  amoxicillin-clavulanate (AUGMENTIN) 875-125 MG tablet Take 1 tablet by mouth 2 (two) times daily.    [provider]  aspirin EC 81 MG tablet Take 1 tablet (81 mg total) by mouth daily. 11/18/13   Fay Records, MD  cetirizine (ZYRTEC) 10 MG tablet Take 10 mg by mouth daily after breakfast.     [provider]  doxycycline (VIBRA-TABS) 100 MG tablet Take 1 tablet (100 mg total) by mouth 2 (two) times daily. 06/27/17   Magdalen Spatz, NP  esomeprazole (NEXIUM) 40 MG capsule Take 40 mg by mouth daily.     [provider]  fluticasone (FLONASE) 50 MCG/ACT nasal spray Place 1 spray into the nose daily as needed for allergies.  06/04/12   [provider]  guaiFENesin (MUCINEX) 600 MG 12 hr  tablet Take 600 mg by mouth daily as needed for to loosen phlegm.     [provider]  HYDROcodone-acetaminophen (NORCO/VICODIN) 5-325 MG tablet Take 1 tablet by mouth every 6 (six) hours as needed. Patient taking differently: Take 0.5 tablets by mouth every 6 (six) hours as needed.  06/14/16   Montine Circle, PA-C  imipramine (TOFRANIL) 25 MG tablet Take 75 mg by mouth at bedtime.  05/26/09   [provider]  insulin lispro (HUMALOG KWIKPEN) 100 UNIT/ML KiwkPen Inject 4-6 Units into the skin 3 (three) times daily with meals. Inject 6 units subcutaneously with breakfast and lunch, inject 5 units with supper    [provider]  levothyroxine (SYNTHROID, LEVOTHROID) 100 MCG tablet Take 100 mcg by mouth daily before breakfast.  [provider]  montelukast (SINGULAIR) 10 MG tablet Take 10 mg by mouth daily with breakfast. 05/10/15   [provider]  predniSONE (DELTASONE) 10 MG tablet Take 2 tablets twice daily for 5 days, then take 1 tablet twice daily for 5 days 06/27/17   Magdalen Spatz, NP  predniSONE (DELTASONE) 5 MG tablet Take 1 tablet (5 mg total) by mouth daily with breakfast. 07/19/16   Chesley Mires, MD  triazolam (HALCION) 0.25 MG tablet Take 0.25 mg by mouth at bedtime as needed for sleep. For sleep    [provider]    Family History Family History  Problem Relation Age of Onset  . Other Mother 29       Died from sepsis  . Stroke Father 40       died  . Heart disease Father   . Diabetes Father   . Cancer Sister        lung  . Diabetes Sister   . Cancer Brother        lung  . Diabetes Brother   . Diabetes Brother     Social History Social History   Tobacco Use  . Smoking status: Former Smoker    Packs/day: 1.00    Years: 40.00    Pack years: 40.00    Types: Cigarettes    Last attempt to quit: 06/26/1988    Years since quitting: 29.0  . Smokeless tobacco: Never Used  . Tobacco comment: 40 pack years  Substance Use  Topics  . Alcohol use: No  . Drug use: No     Allergies   Lantus [insulin glargine]; Levaquin [levofloxacin in d5w]; Maxidex [dexamethasone]; Metoprolol-hydrochlorothiazide; Norvasc [amlodipine]; Novolog [insulin aspart]; Penciclovir; Senna [sennosides]; Sulfonamide derivatives; Titanium; and Adhesive [tape]   Review of Systems Review of Systems  Constitutional: Positive for fever.  Respiratory: Positive for cough and shortness of breath.   Cardiovascular: Negative for chest pain.  Gastrointestinal: Negative for abdominal pain and vomiting.  Genitourinary: Negative for dysuria.  Neurological: Positive for weakness and headaches.  All other systems reviewed and are negative.    Physical Exam Updated Vital Signs BP 138/75 (BP Location: Right Arm)   Pulse (!) 43   Temp 98.7 F (37.1 C) (Oral)   Resp 17   Ht 5\' 2"  (1.575 m)   Wt 59.4 kg (131 lb)   SpO2 93%   BMI 23.96 kg/m   Physical Exam  Constitutional: She is oriented to person, place, and time. She appears well-developed and well-nourished.  HENT:  Head: Normocephalic and atraumatic.  Right Ear: External ear normal.  Left Ear: External ear normal.  Nose: Nose normal.  Eyes: Right eye exhibits no discharge. Left eye exhibits no discharge.  Cardiovascular: Normal rate, regular rhythm and normal heart sounds.  Pulmonary/Chest: Effort normal and breath sounds normal. She has no wheezes.  Abdominal: Soft. There is no tenderness.  Neurological: She is alert and oriented to person, place, and time.  CN 3-12 grossly intact. 5/5 strength in all 4 extremities. Grossly normal sensation. Normal finger to nose.   Skin: Skin is warm and dry.  Nursing note and vitals reviewed.    ED Treatments / Results  Labs (all labs ordered are listed, but only abnormal results are displayed) Labs Reviewed  CBC - Abnormal; Notable for the following components:      Result Value   WBC 16.2 (*)    All other components within normal  limits  URINALYSIS, ROUTINE W REFLEX MICROSCOPIC -  Abnormal; Notable for the following components:   Hgb urine dipstick SMALL (*)    Leukocytes, UA LARGE (*)    Bacteria, UA RARE (*)    Squamous Epithelial / LPF 0-5 (*)    Non Squamous Epithelial 0-5 (*)    All other components within normal limits  D-DIMER, QUANTITATIVE (NOT AT [facility]) - Abnormal; Notable for the following components:   D-Dimer, Quant 0.83 (*)    All other components within normal limits  BASIC METABOLIC PANEL - Abnormal; Notable for the following components:   Sodium 130 (*)    Chloride 98 (*)    Glucose, Bld 198 (*)    Calcium 8.1 (*)    All other components within normal limits  CBG MONITORING, ED - Abnormal; Notable for the following components:   Glucose-Capillary 205 (*)    All other components within normal limits  URINE CULTURE  I-STAT TROPONIN, ED    EKG  EKG Interpretation  Date/Time:  Monday July 09 2017 13:17:17 EST Ventricular Rate:  79 PR Interval:    QRS Duration: 81 QT Interval:  343 QTC Calculation: 394 R Axis:   35 Text Interpretation:  Sinus rhythm Abnormal R-wave progression, early transition Abnormal T, consider ischemia, lateral leads T wave changes similar to May 2018 Confirmed by Sherwood Gambler [phone]) on 07/09/2017 3:19:58 PM       Radiology Dg Chest 2 View  Result Date: 07/09/2017 CLINICAL DATA:  Shortness of breath. EXAM: CHEST  2 VIEW COMPARISON:  06/27/2017 FINDINGS: Heart size and pulmonary vascularity are normal and the lungs are clear. The small patchy infiltrate seen in the right upper lobe on the prior study has resolved. No effusions. Aortic atherosclerosis. Coronary artery stents in place. Surgical clips to the left of the trachea, probably due to previous thyroid surgery. IMPRESSION: Resolution of right upper lobe infiltrate.  No acute abnormalities. Aortic atherosclerosis. Electronically Signed   By: Lorriane Shire M.D.   On: 07/09/2017 14:24     Procedures Procedures (including critical care time)  Medications Ordered in ED Medications  albuterol (PROVENTIL) (2.5 MG/3ML) 0.083% nebulizer solution 5 mg (5 mg Nebulization Given 07/09/17 1448)  sodium chloride 0.9 % bolus 1,000 mL (1,000 mLs Intravenous New Bag/Given 07/09/17 1502)  ipratropium-albuterol (DUONEB) 0.5-2.5 (3) MG/3ML nebulizer solution 3 mL (3 mLs Nebulization Given 07/09/17 1443)  cefTRIAXone (ROCEPHIN) 1 g in dextrose 5 % 50 mL IVPB (1 g Intravenous New Bag/Given 07/09/17 1628)  iopamidol (ISOVUE-370) 76 % injection (100 mLs  Contrast Given 07/09/17 1703)     Initial Impression / Assessment and Plan / ED Course  I have reviewed the triage vital signs and the nursing notes.  Pertinent labs & imaging results that were available during my care of the patient were reviewed by me and considered in my medical decision making (see chart for details).     Patient's lungs are clear.  She does have a urinary tract infection, combined with elevated WBC I think this is probably symptomatic.  There is no clear pneumonia on chest x-ray.  However when ambulating, she became quite hypoxic into the 70s.  Had to stop early.  Thus a CT scan will be obtained to help rule out PE or eval for other acute lung pathology.  At rest her O2 sats are okay.  Care transferred to Dr. Melina Copa with CT scan pending.  Patient will need admission.  Final Clinical Impressions(s) / ED Diagnoses   Final diagnoses:  None    ED Discharge Orders  None       Sherwood Gambler, MD 07/09/17 1705

## 2017-07-09 NOTE — ED Notes (Signed)
Patient transported to X-ray 

## 2017-07-09 NOTE — ED Notes (Signed)
Bed: WA01 Expected date:  Expected time:  Means of arrival:  Comments: EMS-PNA 

## 2017-07-09 NOTE — ED Notes (Signed)
ED TO INPATIENT HANDOFF REPORT  Name/Age/Gender Joseph Berkshire 84 y.o. female  Code Status Code Status History    Date Active Date Inactive Code Status Order ID Comments User Context   11/17/2016 22:13 11/19/2016 13:41 Full Code [id number]  Sid Falcon, MD Inpatient   10/01/2015 15:04 10/06/2015 17:54 Full Code [id number]  Cristal Ford, DO Inpatient   05/16/2015 20:17 05/19/2015 14:18 Full Code [id number]  Karen Kitchens Inpatient   05/02/2015 01:54 05/03/2015 16:15 Full Code [id number]  Reubin Milan, MD Inpatient   07/16/2013 20:32 07/19/2013 18:11 Full Code [id number]  Shanda Howells, MD ED   06/15/2013 23:41 06/19/2013 16:36 Full Code [id number]  Berle Mull, MD Inpatient   03/17/2012 16:32 03/22/2012 19:44 Full Code [id number]  Sandi Raveling, RN Inpatient    Advance Directive Documentation     Most Recent Value  Type of Advance Directive  Healthcare Power of Attorney, Living will  Pre-existing out of facility DNR order (yellow form or pink MOST form)  No data  "MOST" Form in Place?  No data      Home/SNF/Other Home  Chief Complaint sob, weakness  Level of Care/Admitting Diagnosis ED Disposition    ED Disposition Condition [facility] Area: [place] [[id number]]  Level of Care: Telemetry [5]  Admit to tele based on following criteria: Other see comments  Comments: hypoxia  Diagnosis: Hypoxia [300808]  Admitting Physician: Eugenie Filler [[id number]]  Attending Physician: Eugenie Filler [[id number]]  Estimated length of stay: past midnight tomorrow  Certification:: I certify this patient will need inpatient services for at least 2 midnights  PT Class (Do Not Modify): Inpatient [101]  PT Acc Code (Do Not Modify): Private [1]       Medical History Past Medical History:  Diagnosis Date  . Adenomatous colon polyp   . Arthritis   . Blood transfusion   . Bronchitis   . Cancer [facility])    cervical cancer pre hysterectomy  .  Cervical disc disease   . Chronic sinusitis   . CONSTIPATION, CHRONIC 06/26/2007   Qualifier: Diagnosis of  By: Nils Pyle CMA ([place]), Mearl Latin    . COPD (chronic obstructive pulmonary disease) ([place])   . Coronary artery disease   . CVA (cerebral infarction) 1991  . Depression   . Diabetes mellitus   . Diverticulosis   . 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 Dyslipidemia   . Emphysema   . Fibromyalgia   . GERD (gastroesophageal reflux disease)   . Hypertension   . Hypothyroidism   . IBS (irritable bowel syndrome)   . Legally blind   . Macular degeneration   . Myocardial infarction [facility]) may 27th 2007  . Pneumonia 07/16/2013    HX PNEUMONIA  . Pneumonia   . Pulmonary hypertension ([place])   . Raynaud's phenomenon   . Scleroderma ([place])   . Sjogren's disease ([place])   . Sleep apnea    STOP BANG SCORE 5  . 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 Zenker's diverticulum     Allergies Allergies  Allergen Reactions  . Sulfa Antibiotics     Other reaction(s): Other (See Comments) Other Reaction: anaphalaxsis   . Lantus [Insulin Glargine]     Patient states "sulfate binder causes sinus infection/pneumonia".  [place] [Levofloxacin In D5w] Other (See Comments)    Muscle aches  . Maxidex [Dexamethasone] Other (See Comments)    Insomnia, anxiety, dizziness  .  Metoprolol-Hydrochlorothiazide Other (See Comments)     decreased bp  . Norvasc [Amlodipine] Other (See Comments)    BIL Ankle edema  . Novolog [Insulin Aspart]     Patient states "sulfate binder causes sinus infection/pneumonia". Tolerates Humalog.  . Peanut Oil     Other reaction(s): Rash And throat tightening  . Penciclovir     Other reaction(s): Muscle Pain  . Senna [Sennosides] Other (See Comments)    Pt states that it causes a stinging sensation  . Sulfonamide Derivatives Hives  . Titanium Other (See Comments)    Neck wouldn't heal with titanium cervical plate  . Yellow Dye     Other reaction(s): Other  (See Comments)  . Adhesive [Tape] Hives and Rash    IV Location/Drains/Wounds Patient Lines/Drains/Airways Status   Active Line/Drains/Airways    Name:   Placement date:   Placement time:   Site:   Days:   Peripheral IV 07/09/17 Left Antecubital   07/09/17    1211    Antecubital   less than 1   Incision 03/18/12 Abdomen Other (Comment)   03/18/12    1140     1939   Incision 03/18/12 Perineum   03/18/12    1140     1939          Labs/Imaging Results for orders placed or performed during the hospital encounter of 07/09/17 (from the past 48 hour(s))  Urinalysis, Routine w reflex microscopic     Status: Abnormal   Collection Time: 07/09/17  1:32 PM  Result Value Ref Range   Color, Urine YELLOW YELLOW   APPearance CLEAR CLEAR   Specific Gravity, Urine 1.014 1.005 - 1.030   pH 7.0 5.0 - 8.0   Glucose, UA NEGATIVE NEGATIVE mg/dL   Hgb urine dipstick SMALL (A) NEGATIVE   Bilirubin Urine NEGATIVE NEGATIVE   Ketones, ur NEGATIVE NEGATIVE mg/dL   Protein, ur NEGATIVE NEGATIVE mg/dL   Nitrite NEGATIVE NEGATIVE   Leukocytes, UA LARGE (A) NEGATIVE   RBC / HPF 6-30 0 - 5 RBC/hpf   WBC, UA TOO NUMEROUS TO COUNT 0 - 5 WBC/hpf   Bacteria, UA RARE (A) NONE SEEN   Squamous Epithelial / LPF 0-5 (A) NONE SEEN   Mucus PRESENT    Non Squamous Epithelial 0-5 (A) NONE SEEN  CBC     Status: Abnormal   Collection Time: 07/09/17  2:11 PM  Result Value Ref Range   WBC 16.2 (H) 4.0 - 10.5 K/uL   RBC 4.73 3.87 - 5.11 MIL/uL   Hemoglobin 13.7 12.0 - 15.0 g/dL   HCT 40.3 36.0 - 46.0 %   MCV 85.2 78.0 - 100.0 fL   MCH 29.0 26.0 - 34.0 pg   MCHC 34.0 30.0 - 36.0 g/dL   RDW 13.6 11.5 - 15.5 %   Platelets 220 150 - 400 K/uL  Brain natriuretic peptide     Status: None   Collection Time: 07/09/17  2:11 PM  Result Value Ref Range   B Natriuretic Peptide 39.5 0.0 - 100.0 pg/mL  CBG monitoring, ED     Status: Abnormal   Collection Time: 07/09/17  2:16 PM  Result Value Ref Range   Glucose-Capillary 205  (H) 65 - 99 mg/dL  I-Stat Troponin, ED (not at [facility])     Status: None   Collection Time: 07/09/17  2:36 PM  Result Value Ref Range   Troponin i, poc 0.00 0.00 - 0.08 ng/mL   Comment 3  Comment: Due to the release kinetics of cTnI, a negative result within the first hours of the onset of symptoms does not rule out myocardial infarction with certainty. If myocardial infarction is still suspected, repeat the test at appropriate intervals.   D-dimer, quantitative     Status: Abnormal   Collection Time: 07/09/17  2:56 PM  Result Value Ref Range   D-Dimer, Quant 0.83 (H) 0.00 - 0.50 ug/mL-FEU    Comment: (NOTE) At the manufacturer cut-off of 0.50 ug/mL FEU, this assay has been documented to exclude PE with a sensitivity and negative predictive value of 97 to 99%.  At this time, this assay has not been approved by the FDA to exclude DVT/VTE. Results should be correlated with clinical presentation.   Basic metabolic panel     Status: Abnormal   Collection Time: 07/09/17  4:15 PM  Result Value Ref Range   Sodium 130 (L) 135 - 145 mmol/L   Potassium 3.7 3.5 - 5.1 mmol/L   Chloride 98 (L) 101 - 111 mmol/L   CO2 22 22 - 32 mmol/L   Glucose, Bld 198 (H) 65 - 99 mg/dL   BUN 19 6 - 20 mg/dL   Creatinine, Ser 0.68 0.44 - 1.00 mg/dL   Calcium 8.1 (L) 8.9 - 10.3 mg/dL   GFR calc non Af Amer >60 >60 mL/min   GFR calc Af Amer >60 >60 mL/min    Comment: (NOTE) The eGFR has been calculated using the CKD EPI equation. This calculation has not been validated in all clinical situations. eGFR's persistently <60 mL/min signify possible Chronic Kidney Disease.    Anion gap 10 5 - 15  Troponin I (q 6hr x 3)     Status: None   Collection Time: 07/09/17  8:59 PM  Result Value Ref Range   Troponin I <0.03 <0.03 ng/mL  CBG monitoring, ED     Status: Abnormal   Collection Time: 07/09/17 10:14 PM  Result Value Ref Range   Glucose-Capillary 252 (H) 65 - 99 mg/dL   Dg Chest 2 View  Result  Date: 07/09/2017 CLINICAL DATA:  Shortness of breath. EXAM: CHEST  2 VIEW COMPARISON:  06/27/2017 FINDINGS: Heart size and pulmonary vascularity are normal and the lungs are clear. The small patchy infiltrate seen in the right upper lobe on the prior study has resolved. No effusions. Aortic atherosclerosis. Coronary artery stents in place. Surgical clips to the left of the trachea, probably due to previous thyroid surgery. IMPRESSION: Resolution of right upper lobe infiltrate.  No acute abnormalities. Aortic atherosclerosis. Electronically Signed   By: Lorriane Shire M.D.   On: 07/09/2017 14:24   Ct Angio Chest Pe W And/or Wo Contrast  Result Date: 07/09/2017 CLINICAL DATA:  Cough, shortness of breath and positive D-dimer EXAM: CT ANGIOGRAPHY CHEST WITH CONTRAST TECHNIQUE: Multidetector CT imaging of the chest was performed using the standard protocol during bolus administration of intravenous contrast. Multiplanar CT image reconstructions and MIPs were obtained to evaluate the vascular anatomy. CONTRAST:  188m ISOVUE-370 IOPAMIDOL (ISOVUE-370) INJECTION 76% COMPARISON:  Chest x-ray 07/09/2017, CT chest 06/14/2016 FINDINGS: Cardiovascular: Satisfactory opacification of the pulmonary arteries to the segmental level. No evidence of pulmonary embolism. Moderate aortic atherosclerosis. No aneurysmal dilatation. Normal heart size. Coronary vessel calcification. No pericardial effusion Mediastinum/Nodes: Midline trachea. No thyroid mass. Surgical changes in the region of left lobe of the thyroid. Esophagus within normal limits. No significantly enlarged mediastinal lymph nodes Lungs/Pleura: No pleural effusion. Mild ground-glass density in the posterior right  upper lobe, series 10, image number 34. Mild nodular density in the lateral right upper lobe, series 10, image number 56. Upper Abdomen: No acute abnormality. Musculoskeletal: Degenerative changes. No acute or suspicious lesion Review of the MIP images confirms  the above findings. IMPRESSION: 1. Negative for acute pulmonary embolus 2. Mild ground-glass density in the posterior right upper lobe could relate to minimal residual inflammation or infiltrate. Slightly nodular density in the lateral right upper lobe, suspect that this also represents residual nodular infiltrate, however suggest short interval CT follow-up in 6-12 weeks to ensure resolution. Aortic Atherosclerosis (ICD10-I70.0). Electronically Signed   By: Donavan Foil M.D.   On: 07/09/2017 17:27    Pending Labs Unresulted Labs (From admission, onward)   Start     Ordered   07/10/17 0500  Hemoglobin A1c  Tomorrow morning,   R    Comments:  To assess prior glycemic control    07/09/17 1942   07/09/17 2000  Sodium, urine, random  Once,   R     07/09/17 1959   07/09/17 2000  Creatinine, urine, random  Once,   R     07/09/17 1959   07/09/17 2000  Osmolality, urine  Once,   R     07/09/17 1959   07/09/17 2000  Osmolality  Once,   R     07/09/17 1959   07/09/17 1948  Troponin I (q 6hr x 3)  Now then every 6 hours,   R     07/09/17 1947   07/09/17 1854  MRSA PCR Screening  Once,   R     07/09/17 1854   07/09/17 1800  Influenza panel by PCR (type A & B)  (Influenza PCR Panel)  Once,   R     07/09/17 1759   07/09/17 1458  Urine culture  STAT,   STAT     07/09/17 1457   Signed and Held  HIV antibody  Once,   R     Signed and Held   Signed and Held  Culture, blood (routine x 2) Call MD if unable to obtain prior to antibiotics being given  BLOOD CULTURE X 2,   R    Comments:  If blood cultures drawn in Emergency Department - Do not draw and cancel order    Signed and Held   Signed and Held  Culture, sputum-assessment  Once,   R     Signed and Held   Signed and Held  Gram stain  Once,   R     Signed and Held   Signed and Held  Strep pneumoniae urinary antigen  Once,   R     Signed and Held   Visual merchandiser and Held  TSH  Tomorrow morning,   R     Signed and Held   Visual merchandiser and Held  Respiratory  Panel by PCR  (Respiratory virus panel)  Once,   R     Signed and Held   Signed and Held  Legionella pneumophila Total Ab  (not at [facility])  Once,   R     Signed and Held   Signed and [employer] metabolic panel  Tomorrow morning,   R     Signed and Held   Signed and Held  CBC WITH DIFFERENTIAL  Tomorrow morning,   R     Signed and Held      Vitals/Pain Today's Vitals   07/09/17 1702 07/09/17 1951 07/09/17 1957 07/09/17 2031  BP: 138/75 [name])  137/53  (!) 121/48  Pulse: (!) 43 82  81  Resp: _0 Temp:    98.2 F (36.8 C)  TempSrc:    Oral  SpO2: 93% 94% 100% 100%  Weight:      Height:        Isolation Precautions Droplet precaution  Medications Medications  budesonide (PULMICORT) nebulizer solution 0.5 mg (0.5 mg Nebulization Given 07/09/17 1957)  methylPREDNISolone sodium succinate (SOLU-MEDROL) 40 mg/mL injection 40 mg (40 mg Intravenous Given 07/09/17 2032)  insulin aspart (novoLOG) injection 0-20 Units (not administered)  insulin aspart (novoLOG) injection 0-5 Units (not administered)  ipratropium-albuterol (DUONEB) 0.5-2.5 (3) MG/3ML nebulizer solution 3 mL (not administered)  albuterol (PROVENTIL) (2.5 MG/3ML) 0.083% nebulizer solution 2.5 mg (not administered)  albuterol (PROVENTIL) (2.5 MG/3ML) 0.083% nebulizer solution 5 mg (5 mg Nebulization Given 07/09/17 1448)  sodium chloride 0.9 % bolus 1,000 mL (0 mLs Intravenous Stopped 07/09/17 2030)  ipratropium-albuterol (DUONEB) 0.5-2.5 (3) MG/3ML nebulizer solution 3 mL (3 mLs Nebulization Given 07/09/17 1443)  cefTRIAXone (ROCEPHIN) 1 g in dextrose 5 % 50 mL IVPB (0 g Intravenous Stopped 07/09/17 1740)  iopamidol (ISOVUE-370) 76 % injection (100 mLs  Contrast Given 07/09/17 1703)    Mobility Walks with person assist

## 2017-07-09 NOTE — ED Notes (Signed)
Ambulated pt from bed to hall.  Pt sob while walking.  O2 sat begin at 73% when pt stands with no O2 on.  O2 sats only increase to 84% while walking.  Pt not able to go any further than room door without O2 dropping and pulse rate dropping as well.  EDP notified.

## 2017-07-09 NOTE — ED Notes (Signed)
Pt allergic to novolog, per chart. Novolog order placed. Pt has home humalog at bedside. Called pharmacy, they state if we can get hospitalist to change order they can verify home med. Paged hospitalist.

## 2017-07-10 ENCOUNTER — Inpatient Hospital Stay (HOSPITAL_COMMUNITY): Payer: Medicare Other

## 2017-07-10 DIAGNOSIS — D638 Anemia in other chronic diseases classified elsewhere: Secondary | ICD-10-CM

## 2017-07-10 DIAGNOSIS — E785 Hyperlipidemia, unspecified: Secondary | ICD-10-CM

## 2017-07-10 DIAGNOSIS — E860 Dehydration: Secondary | ICD-10-CM

## 2017-07-10 DIAGNOSIS — R0600 Dyspnea, unspecified: Secondary | ICD-10-CM

## 2017-07-10 DIAGNOSIS — F329 Major depressive disorder, single episode, unspecified: Secondary | ICD-10-CM

## 2017-07-10 LAB — CBC WITH DIFFERENTIAL/PLATELET
Basophils Absolute: 0 10*3/uL (ref 0.0–0.1)
Basophils Relative: 0 %
Eosinophils Absolute: 0 10*3/uL (ref 0.0–0.7)
Eosinophils Relative: 0 %
HCT: 40.4 % (ref 36.0–46.0)
Hemoglobin: 13.6 g/dL (ref 12.0–15.0)
Lymphocytes Relative: 7 %
Lymphs Abs: 0.7 10*3/uL (ref 0.7–4.0)
MCH: 29.4 pg (ref 26.0–34.0)
MCHC: 33.7 g/dL (ref 30.0–36.0)
MCV: 87.4 fL (ref 78.0–100.0)
Monocytes Absolute: 0.2 10*3/uL (ref 0.1–1.0)
Monocytes Relative: 2 %
Neutro Abs: 9.6 10*3/uL — ABNORMAL HIGH (ref 1.7–7.7)
Neutrophils Relative %: 91 %
Platelets: 183 10*3/uL (ref 150–400)
RBC: 4.62 MIL/uL (ref 3.87–5.11)
RDW: 13.8 % (ref 11.5–15.5)
WBC: 10.5 10*3/uL (ref 4.0–10.5)

## 2017-07-10 LAB — INFLUENZA PANEL BY PCR (TYPE A & B)
Influenza A By PCR: NEGATIVE
Influenza B By PCR: NEGATIVE

## 2017-07-10 LAB — ECHOCARDIOGRAM COMPLETE
Height: 62 in
Weight: 2109.36 oz

## 2017-07-10 LAB — HEMOGLOBIN A1C
Hgb A1c MFr Bld: 8.2 % — ABNORMAL HIGH (ref 4.8–5.6)
Mean Plasma Glucose: 188.64 mg/dL

## 2017-07-10 LAB — BASIC METABOLIC PANEL
Anion gap: 10 (ref 5–15)
Anion gap: 11 (ref 5–15)
BUN: 20 mg/dL (ref 6–20)
BUN: 18 mg/dL (ref 6–20)
CO2: 20 mmol/L — ABNORMAL LOW (ref 22–32)
CO2: 19 mmol/L — ABNORMAL LOW (ref 22–32)
Calcium: 8.4 mg/dL — ABNORMAL LOW (ref 8.9–10.3)
Calcium: 8.5 mg/dL — ABNORMAL LOW (ref 8.9–10.3)
Chloride: 96 mmol/L — ABNORMAL LOW (ref 101–111)
Chloride: 98 mmol/L — ABNORMAL LOW (ref 101–111)
Creatinine, Ser: 0.78 mg/dL (ref 0.44–1.00)
Creatinine, Ser: 0.73 mg/dL (ref 0.44–1.00)
GFR calc Af Amer: >60 mL/min (ref >60)
GFR calc Af Amer: >60 mL/min (ref >60)
GFR calc non Af Amer: >60 mL/min (ref >60)
GFR calc non Af Amer: >60 mL/min (ref >60)
Glucose, Bld: 343 mg/dL — ABNORMAL HIGH (ref 65–99)
Glucose, Bld: 311 mg/dL — ABNORMAL HIGH (ref 65–99)
Potassium: 4.7 mmol/L (ref 3.5–5.1)
Potassium: 4.7 mmol/L (ref 3.5–5.1)
Sodium: 126 mmol/L — ABNORMAL LOW (ref 135–145)
Sodium: 128 mmol/L — ABNORMAL LOW (ref 135–145)

## 2017-07-10 LAB — RESPIRATORY PANEL BY PCR

## 2017-07-10 LAB — MRSA PCR SCREENING: MRSA by PCR: NEGATIVE

## 2017-07-10 LAB — GLUCOSE, CAPILLARY
Glucose-Capillary: 262 mg/dL — ABNORMAL HIGH (ref 65–99)
Glucose-Capillary: 300 mg/dL — ABNORMAL HIGH (ref 65–99)
Glucose-Capillary: 313 mg/dL — ABNORMAL HIGH (ref 65–99)
Glucose-Capillary: 205 mg/dL — ABNORMAL HIGH (ref 65–99)

## 2017-07-10 LAB — CREATININE, URINE, RANDOM: Creatinine, Urine: 60.49 mg/dL

## 2017-07-10 LAB — STREP PNEUMONIAE URINARY ANTIGEN: Strep Pneumo Urinary Antigen: NEGATIVE

## 2017-07-10 LAB — TROPONIN I
Troponin I: <0.03 ng/mL (ref <0.03)
Troponin I: <0.03 ng/mL (ref <0.03)

## 2017-07-10 LAB — SODIUM, URINE, RANDOM: Sodium, Ur: 75 mmol/L

## 2017-07-10 LAB — HIV ANTIBODY (ROUTINE TESTING W REFLEX): HIV Screen 4th Generation wRfx: NONREACTIVE

## 2017-07-10 LAB — OSMOLALITY, URINE: Osmolality, Ur: 514 mOsm/kg (ref 300–900)

## 2017-07-10 LAB — TSH: TSH: 2.354 u[IU]/mL (ref 0.350–4.500)

## 2017-07-10 LAB — OSMOLALITY: Osmolality: 281 mOsm/kg (ref 275–295)

## 2017-07-10 MED ORDER — ESOMEPRAZOLE MAGNESIUM 20 MG PO CPDR
20.0000 mg | DELAYED_RELEASE_CAPSULE | Freq: Every day | ORAL | Status: DC
  Administered 2017-07-10 – 2017-07-11 (×2): 20 mg via ORAL
  Filled 2017-07-10 (×2): qty 1

## 2017-07-10 MED ORDER — INSULIN LISPRO 100 UNIT/ML (KWIKPEN)
0.0000 [IU] | PEN_INJECTOR | Freq: Three times a day (TID) | SUBCUTANEOUS | Status: DC
Start: 2017-07-10 — End: 2017-07-11
  Administered 2017-07-10: 11 [IU] via SUBCUTANEOUS
  Administered 2017-07-10: 6 [IU] via SUBCUTANEOUS
  Administered 2017-07-10 – 2017-07-11 (×2): 15 [IU] via SUBCUTANEOUS
  Administered 2017-07-11: 7 [IU] via SUBCUTANEOUS

## 2017-07-10 MED ORDER — INSULIN DETEMIR 100 UNIT/ML ~~LOC~~ SOLN
10.0000 [IU] | Freq: Every day | SUBCUTANEOUS | Status: DC
  Filled 2017-07-10 (×2): qty 0.1

## 2017-07-10 MED ORDER — SODIUM CHLORIDE 0.9 % IV SOLN
INTRAVENOUS | Status: DC
  Administered 2017-07-10 – 2017-07-11 (×2): via INTRAVENOUS

## 2017-07-10 MED ORDER — INSULIN LISPRO 100 UNIT/ML (KWIKPEN)
5.0000 [IU] | PEN_INJECTOR | Freq: Every day | SUBCUTANEOUS | Status: DC
  Administered 2017-07-10 (×2): 5 [IU] via SUBCUTANEOUS

## 2017-07-10 MED ORDER — INSULIN LISPRO 100 UNIT/ML (KWIKPEN)
0.0000 [IU] | PEN_INJECTOR | Freq: Every day | SUBCUTANEOUS | Status: DC
  Administered 2017-07-10: 2 [IU] via SUBCUTANEOUS

## 2017-07-10 MED ORDER — NON FORMULARY: 20.0000 mg | Freq: Every day | Status: DC

## 2017-07-10 MED ORDER — CEFEPIME HCL 2 G IJ SOLR
2.0000 g | INTRAMUSCULAR | Status: DC
  Administered 2017-07-10: 2 g via INTRAVENOUS
  Filled 2017-07-10 (×2): qty 2

## 2017-07-10 MED ORDER — INSULIN LISPRO 100 UNIT/ML (KWIKPEN)
6.0000 [IU] | PEN_INJECTOR | Freq: Two times a day (BID) | SUBCUTANEOUS | Status: DC
  Administered 2017-07-10 – 2017-07-11 (×3): 6 [IU] via SUBCUTANEOUS

## 2017-07-10 NOTE — Consult Note (Addendum)
   [facility] CM Inpatient Consult   07/10/2017  PANG ROBERS 1931-02-10 [id number]   Made aware by Carmel [place] with [facility] (home based outpatient palliative care program) that patient is active with [facility].   Will alert inpatient RNCM of above notes.    Marthenia Rolling, MSN-Ed, RN,BSN [place] Liaison [phone]

## 2017-07-10 NOTE — Progress Notes (Signed)
Patient refused to allow the phlebotomist to draw her labs. Lamar Blinks, NP notified. Will re-attempt when patient agrees.

## 2017-07-10 NOTE — Evaluation (Signed)
Occupational Therapy Evaluation Patient Details Name: [name] MRN: [id number] DOB: 06-09-1931 Today's Date: 07/10/2017    History of Present Illness 84 y.o. female with medical history significant of asthma, Sjogren's syndrome, prior history of abnormal CT chest with concerns for [place] in the setting of emphysema on chronic prednisone presented to the ED with a 3-day history of worsening shortness of breath on minimal exertion, did not feel good, decreased energy, decreased oral intake.    Clinical Impression   Pt was admitted for the above. She is independent with adls at home. Of note, pt is legally blind and in an unfamiliar setting.  No further OT is needed at this time    Follow Up Recommendations  No OT follow up;Supervision - Intermittent    Equipment Recommendations  None recommended by OT    Recommendations for Other Services       Precautions / Restrictions Precautions Precautions: Other (comment) Precaution Comments: legally blind, can make out shapes; denies h/o falls in past 1 year Restrictions Weight Bearing Restrictions: No      Mobility Bed Mobility Overal bed mobility: Modified Independent             General bed mobility comments: with bedrail  Transfers Overall transfer level: Needs assistance Equipment used: None Transfers: Sit to/from Stand Sit to Stand: Supervision         General transfer comment: legally blind, unfamiliar environment    Balance Overall balance assessment: Independent                                         ADL either performed or assessed with clinical judgement   ADL Overall ADL's : Needs assistance/impaired                                       General ADL Comments: set up/supervision for adl.  Walked to bathroom, completed this and used toilet.  Pt with urine running down leg after using toilet.  Used mesh underwear and pad. Also, IV infiltrated.  RN came in and  disconnected it. Pt feels she has friends who can assist as needed.      Vision   Additional Comments: legally blind     Perception     Praxis      Pertinent Vitals/Pain Pain Assessment: No/denies pain     Hand Dominance Right   Extremity/Trunk Assessment Upper Extremity Assessment Upper Extremity Assessment: Overall WFL for tasks assessed      Cervical / Trunk Assessment Cervical / Trunk Assessment: Normal   Communication Communication Communication: No difficulties   Cognition Arousal/Alertness: Awake/alert Behavior During Therapy: WFL for tasks assessed/performed Overall Cognitive Status: Within Functional Limits for tasks assessed                                     General Comments       Exercises     Shoulder Instructions      Home Living Family/patient expects to be discharged to:: Private residence Living Arrangements: Alone Available Help at Discharge: Family;Available PRN/intermittently Type of Home: House Home Access: Level entry     Home Layout: Two level;Able to live on main level with bedroom/bathroom Alternate Level Stairs-Number of Steps: flight  Bathroom Shower/Tub: Occupational psychologist: Standard         Additional Comments: stays on first floor      Prior Functioning/Environment Level of Independence: Independent        Comments: independent with mobility and ADLs, doesn't drive 2* poor vision, daughter and neighbors assist with transportation        OT Problem List:        OT Treatment/Interventions:      OT Goals(Current goals can be found in the care plan section) Acute Rehab OT Goals Patient Stated Goal: return home OT Goal Formulation: All assessment and education complete, DC therapy  OT Frequency:     Barriers to D/C:            Co-evaluation              AM-PAC PT "6 Clicks" Daily Activity     Outcome Measure Help from another person eating meals?: None Help from  another person taking care of personal grooming?: A Little Help from another person toileting, which includes using toliet, bedpan, or urinal?: A Little Help from another person bathing (including washing, rinsing, drying)?: A Little Help from another person to put on and taking off regular upper body clothing?: A Little Help from another person to put on and taking off regular lower body clothing?: A Little 6 Click Score: 19   End of Session    Activity Tolerance: Patient tolerated treatment well Patient left: in chair;with call bell/phone within reach  OT Visit Diagnosis: Muscle weakness (generalized) (M62.81)                Time: 1403-1430 OT Time Calculation (min): 27 min Charges:  OT General Charges $OT Visit: 1 Visit OT Evaluation $OT Eval Low Complexity: 1 Low OT Treatments $Self Care/Home Management : 8-22 mins G-Codes:     Lesle Chris, OTR/L [phone] 07/10/2017  [name] 07/10/2017, 3:46 PM

## 2017-07-10 NOTE — Progress Notes (Signed)
PHARMACY NOTE -  ANTIBIOTIC RENAL DOSE ADJUSTMENT  Patient has been initiated on cefepime for PNA. SCr 0.78, estimated CrCl 41 ml/min  Cefepime 2 gm q12 decreased to cefepime 2 gm q24 for CrCl 30-50 ml/min. Thanks Eudelia Bunch, Pharm.D. [phone] 07/10/2017 9:27 AM

## 2017-07-10 NOTE — Progress Notes (Signed)
PT Cancellation Note  Patient Details Name: [name] MRN: [id number] DOB: Oct 19, 1930   Cancelled Treatment:    Reason Eval/Treat Not Completed: Patient at procedure or test/unavailable(pt having test in room, will attempt later. )   Philomena Doheny 07/10/2017, 11:38 AM [phone]

## 2017-07-10 NOTE — Evaluation (Signed)
Physical Therapy Evaluation Patient Details Name: [name] MRN: [id number] DOB: 1931-02-16 Today's Date: 07/10/2017   History of Present Illness  84 y.o. female with medical history significant of asthma, Sjogren's syndrome, prior history of abnormal CT chest with concerns for Boop in the setting of emphysema on chronic prednisone presented to the ED with a 3-day history of worsening shortness of breath on minimal exertion, did not feel good, decreased energy, decreased oral intake.   Clinical Impression  Pt ambulated 200' without an assistive device, no loss of balance, SaO2 94% on room air. She appears to be at baseline for mobility and required supervision only due to her poor vision. From PT standpoint, she is ready to DC home, no follow up PT nor DME needed. PT signing off.     Follow Up Recommendations No PT follow up    Equipment Recommendations  None recommended by PT    Recommendations for Other Services       Precautions / Restrictions Precautions Precautions: Other (comment) Precaution Comments: legally blind, can make out shapes; denies h/o falls in past 1 year Restrictions Weight Bearing Restrictions: No      Mobility  Bed Mobility Overal bed mobility: Modified Independent             General bed mobility comments: with bedrail  Transfers Overall transfer level: Needs assistance Equipment used: None Transfers: Sit to/from Stand Sit to Stand: Supervision         General transfer comment: supervision for safety 2* poor vision  Ambulation/Gait Ambulation/Gait assistance: Supervision Ambulation Distance (Feet): 200 Feet Assistive device: None Gait Pattern/deviations: WFL(Within Functional Limits);Decreased stride length   Gait velocity interpretation: at or above normal speed for age/gender General Gait Details: steady, no loss of balance, SaO2 94% on room air walking, supervision only for managing obstacles 2* poor vision; decr stride length  (pt reports this is due to "no cartilege in both ankles")  Stairs            Wheelchair Mobility    Modified Rankin (Stroke Patients Only)       Balance Overall balance assessment: Independent                                           Pertinent Vitals/Pain Pain Assessment: No/denies pain    Home Living Family/patient expects to be discharged to:: Private residence Living Arrangements: Alone Available Help at Discharge: Family;Available PRN/intermittently Type of Home: House Home Access: Level entry     Home Layout: Two level;Able to live on main level with bedroom/bathroom   Additional Comments: stays on first floor    Prior Function Level of Independence: Independent         Comments: independent with mobility and ADLs, doesn't drive 2* poor vision, daughter and neighbors assist with transportation     Hand Dominance        Extremity/Trunk Assessment   Upper Extremity Assessment Upper Extremity Assessment: Overall WFL for tasks assessed    Lower Extremity Assessment Lower Extremity Assessment: Overall WFL for tasks assessed(pt reports "no cartilege" in B ankles)    Cervical / Trunk Assessment Cervical / Trunk Assessment: Normal  Communication   Communication: No difficulties  Cognition Arousal/Alertness: Awake/alert Behavior During Therapy: WFL for tasks assessed/performed Overall Cognitive Status: Within Functional Limits for tasks assessed  General Comments      Exercises     Assessment/Plan    PT Assessment Patent does not need any further PT services  PT Problem List         PT Treatment Interventions      PT Goals (Current goals can be found in the Care Plan section)  Acute Rehab PT Goals Patient Stated Goal: likes to cook for her family PT Goal Formulation: All assessment and education complete, DC therapy    Frequency     Barriers to discharge         Co-evaluation               AM-PAC PT "6 Clicks" Daily Activity  Outcome Measure Difficulty turning over in bed (including adjusting bedclothes, sheets and blankets)?: None Difficulty moving from lying on back to sitting on the side of the bed? : None Difficulty sitting down on and standing up from a chair with arms (e.g., wheelchair, bedside commode, etc,.)?: None Help needed moving to and from a bed to chair (including a wheelchair)?: None Help needed walking in hospital room?: None Help needed climbing 3-5 steps with a railing? : None 6 Click Score: 24    End of Session Equipment Utilized During Treatment: Gait belt Activity Tolerance: Patient tolerated treatment well Patient left: in bed;with call bell/phone within reach;with nursing/sitter in room Nurse Communication: Mobility status      Time: 1259-1320 PT Time Calculation (min) (ACUTE ONLY): 21 min   Charges:   PT Evaluation $PT Eval Low Complexity: 1 Low     PT G Codes:          [name] 07/10/2017, 1:30 PM [phone]

## 2017-07-10 NOTE — Progress Notes (Signed)
PROGRESS NOTE    [name]  GLO:[id number] DOB: 1930-11-06 DOA: 07/09/2017 PCP: Merrilee Seashore, MD   Brief Narrative:  Patient is a 84 year old female history of Sjogren's syndrome, prior history of abnormal CT chest with concerns for group in the past, emphysema, chronic steroid use presented with a 3-day history of worsening shortness of breath on minimal exertion with hypoxia after she is finished a course of antibiotics x2 for pneumonia.  First course was in December.  Second course was 3-4 days prior to admission.  Patient admitted due to hypoxia.  Patient placed empirically on IV cefepime and azithromycin.  Pulmonary consulted.   Assessment & Plan:   Principal Problem:   Hypoxia Active Problems:   Hyperlipidemia   Depression   Legal blindness   Essential hypertension   Coronary atherosclerosis   Systemic sclerosis (HCC)   Diabetes mellitus (HCC)   COPD (chronic obstructive pulmonary disease) (HCC)   Anemia of chronic disease   CAP (community acquired pneumonia)   Hyponatremia   Hypothyroidism   Dehydration  #1 hypoxia/probable partially treated community-acquired pneumonia Questionable etiology.  Likely secondary to partially treated pneumonia (in the setting of connective tissue disease) versus cardiac etiology which is low on the differential.  Patient has failed outpatient treatment x2.  Patient was treated with Augmentin in December 2018 and doxycycline just prior to admission.  Posttreatment patient symptoms resume and patient developed hypoxia.  BNP within normal limits.  CT chest negative for PE however worrisome for inflammatory versus infiltrate.  Patient with some clinical improvement.  Cardiac enzymes negative x3.  2D echo pending.  Urine strep pneumococcus antigen negative.  Urine Legionella antigen pending.  Influenza PCR negative.  Respiratory viral panel negative.  MRSA PCR negative.  Serum Gram stain and cultures pending.  Blood cultures pending.   Continue current regimen of IV azithromycin IV cefepime, Pulmicort, Flonase, Mucinex, IV steroids.  As patient with underlying connective tissue disease, recently treated x2 for pneumonia and still presented with persistent hypoxia will consult with pulmonary for further evaluation and management.  2.  Hyponatremia Likely secondary to hypovolemic hyponatremia.  BNP within normal limits.  Patient with no signs or symptoms of volume overload on examination.  Check a fractional excretion of sodium.  Check a urine osmolality.  Check a serum osmolality.  Placed on gentle hydration with IV fluids and follow.  3.  COPD Stable.  Continue Pulmicort, Flonase, scheduled nebs.  4.  Hypothyroidism Continue Synthroid.  5.  Dehydration IV fluids.  6.  Anemia of chronic disease  No overt bleeding.  H&H stable.  7.  Hypertension Stable.  8.  Diabetes mellitus Hemoglobin A1c 8.2.  ABGs have ranged from 261-300.  States allergic to NovoLog and as such had to be switched to a home regimen of Humalog overnight CBGs elevated secondary to steroids.  Patient refusing long-acting Levemir and as such we will discontinue.  9.  UTI Cultures pending.  On IV cefepime.  10.  Stomach sclerosis/connective tissue disease On chronic prednisone.  Continue IV Solu-Medrol with taper.   DVT prophylaxis: Lovenox Code Status: Full Family Communication: Updated patient.  No family at bedside. Disposition Plan: Likely home, when medically improved.   Consultants:   Pulmonary pending  Procedures:   CT angiogram chest 07/09/2017  Chest x-ray 07/09/2017  Antimicrobials:   IV cefepime 07/09/2017  IV azithromycin 07/09/2017   Subjective: Patient sleeping. Patient states improvement with SOB. No CP.  Objective: Vitals:   07/09/17 2234 07/09/17 2340 07/10/17 0659 07/10/17  1010  BP: (!) 121/55 (!) 130/46 (!) 132/49   Pulse: 66 65 68   Resp: 14 20 19    Temp: 98.6 F (37 C) (!) 97.4 F (36.3 C) 98.7 F (37.1  C)   TempSrc: Oral Oral Oral   SpO2: 100% 100% 99% 99%  Weight:  59.8 kg (131 lb 13.4 oz)    Height:  5\' 2"  (1.575 m)      Intake/Output Summary (Last 24 hours) at 07/10/2017 1109 Last data filed at 07/10/2017 0600 Gross per 24 hour  Intake 1740 ml  Output 700 ml  Net 1040 ml   Filed Weights   07/09/17 1226 07/09/17 2340  Weight: 59.4 kg (131 lb) 59.8 kg (131 lb 13.4 oz)    Examination:  General exam: NAD Respiratory system: Some scattered coarse BS. No wheezing. Respiratory effort normal. Cardiovascular system: S1 & S2 heard, RRR. No JVD, murmurs, rubs, gallops or clicks. No pedal edema. Gastrointestinal system: Abdomen is nondistended, soft and nontender. No organomegaly or masses felt. Normal bowel sounds heard. Central nervous system: Alert and oriented. No focal neurological deficits. Extremities: Symmetric 5 x 5 power. Skin: No rashes, lesions or ulcers Psychiatry: Judgement and insight appear normal. Mood & affect appropriate.     Data Reviewed: I have personally reviewed following labs and imaging studies  CBC: Recent Labs  Lab 07/09/17 1411 07/10/17 0505  WBC 16.2* 10.5  NEUTROABS  --  9.6*  HGB 13.7 13.6  HCT 40.3 40.4  MCV 85.2 87.4  PLT 220 161   Basic Metabolic Panel: Recent Labs  Lab 07/09/17 1615 07/10/17 0505  NA 130* 126*  K 3.7 4.7  CL 98* 96*  CO2 22 20*  GLUCOSE 198* 343*  BUN 19 20  CREATININE 0.68 0.78  CALCIUM 8.1* 8.4*   GFR: Estimated Creatinine Clearance: 41.4 mL/min (by C-G formula based on SCr of 0.78 mg/dL). Liver Function Tests: No results for input(s): AST, ALT, ALKPHOS, BILITOT, PROT, ALBUMIN in the last 168 hours. No results for input(s): LIPASE, AMYLASE in the last 168 hours. No results for input(s): AMMONIA in the last 168 hours. Coagulation Profile: No results for input(s): INR, PROTIME in the last 168 hours. Cardiac Enzymes: Recent Labs  Lab 07/09/17 2059 07/10/17 0516 07/10/17 0843  TROPONINI <0.03 <0.03  <0.03   BNP (last 3 results) No results for input(s): PROBNP in the last 8760 hours. HbA1C: Recent Labs    07/10/17 0516  HGBA1C 8.2*   CBG: Recent Labs  Lab 07/09/17 1416 07/09/17 2214 07/09/17 2340 07/10/17 0957  GLUCAP 205* 252* 261* 262*   Lipid Profile: No results for input(s): CHOL, HDL, LDLCALC, TRIG, CHOLHDL, LDLDIRECT in the last 72 hours. Thyroid Function Tests: Recent Labs    07/10/17 0505  TSH 2.354   Anemia Panel: No results for input(s): VITAMINB12, FOLATE, FERRITIN, TIBC, IRON, RETICCTPCT in the last 72 hours. Sepsis Labs: No results for input(s): PROCALCITON, LATICACIDVEN in the last 168 hours.  Recent Results (from the past 240 hour(s))  Respiratory Panel by PCR     Status: None   Collection Time: 07/09/17 12:55 AM  Result Value Ref Range Status   Adenovirus NOT DETECTED NOT DETECTED Final   Coronavirus 229E NOT DETECTED NOT DETECTED Final   Coronavirus HKU1 NOT DETECTED NOT DETECTED Final   Coronavirus NL63 NOT DETECTED NOT DETECTED Final   Coronavirus OC43 NOT DETECTED NOT DETECTED Final   Metapneumovirus NOT DETECTED NOT DETECTED Final   Rhinovirus / Enterovirus NOT DETECTED NOT DETECTED Final  Influenza A NOT DETECTED NOT DETECTED Final   Influenza B NOT DETECTED NOT DETECTED Final   Parainfluenza Virus 1 NOT DETECTED NOT DETECTED Final   Parainfluenza Virus 2 NOT DETECTED NOT DETECTED Final   Parainfluenza Virus 3 NOT DETECTED NOT DETECTED Final   Parainfluenza Virus 4 NOT DETECTED NOT DETECTED Final   Respiratory Syncytial Virus NOT DETECTED NOT DETECTED Final   Bordetella pertussis NOT DETECTED NOT DETECTED Final   Chlamydophila pneumoniae NOT DETECTED NOT DETECTED Final   Mycoplasma pneumoniae NOT DETECTED NOT DETECTED Final  MRSA PCR Screening     Status: None   Collection Time: 07/10/17 12:55 AM  Result Value Ref Range Status   MRSA by PCR NEGATIVE NEGATIVE Final    Comment:        The GeneXpert MRSA Assay (FDA approved for NASAL  specimens only), is one component of a comprehensive MRSA colonization surveillance program. It is not intended to diagnose MRSA infection nor to guide or monitor treatment for MRSA infections.          Radiology Studies: Dg Chest 2 View  Result Date: 07/09/2017 CLINICAL DATA:  Shortness of breath. EXAM: CHEST  2 VIEW COMPARISON:  06/27/2017 FINDINGS: Heart size and pulmonary vascularity are normal and the lungs are clear. The small patchy infiltrate seen in the right upper lobe on the prior study has resolved. No effusions. Aortic atherosclerosis. Coronary artery stents in place. Surgical clips to the left of the trachea, probably due to previous thyroid surgery. IMPRESSION: Resolution of right upper lobe infiltrate.  No acute abnormalities. Aortic atherosclerosis. Electronically Signed   By: Lorriane Shire M.D.   On: 07/09/2017 14:24   Ct Angio Chest Pe W And/or Wo Contrast  Result Date: 07/09/2017 CLINICAL DATA:  Cough, shortness of breath and positive D-dimer EXAM: CT ANGIOGRAPHY CHEST WITH CONTRAST TECHNIQUE: Multidetector CT imaging of the chest was performed using the standard protocol during bolus administration of intravenous contrast. Multiplanar CT image reconstructions and MIPs were obtained to evaluate the vascular anatomy. CONTRAST:  158mL ISOVUE-370 IOPAMIDOL (ISOVUE-370) INJECTION 76% COMPARISON:  Chest x-ray 07/09/2017, CT chest 06/14/2016 FINDINGS: Cardiovascular: Satisfactory opacification of the pulmonary arteries to the segmental level. No evidence of pulmonary embolism. Moderate aortic atherosclerosis. No aneurysmal dilatation. Normal heart size. Coronary vessel calcification. No pericardial effusion Mediastinum/Nodes: Midline trachea. No thyroid mass. Surgical changes in the region of left lobe of the thyroid. Esophagus within normal limits. No significantly enlarged mediastinal lymph nodes Lungs/Pleura: No pleural effusion. Mild ground-glass density in the posterior  right upper lobe, series 10, image number 34. Mild nodular density in the lateral right upper lobe, series 10, image number 56. Upper Abdomen: No acute abnormality. Musculoskeletal: Degenerative changes. No acute or suspicious lesion Review of the MIP images confirms the above findings. IMPRESSION: 1. Negative for acute pulmonary embolus 2. Mild ground-glass density in the posterior right upper lobe could relate to minimal residual inflammation or infiltrate. Slightly nodular density in the lateral right upper lobe, suspect that this also represents residual nodular infiltrate, however suggest short interval CT follow-up in 6-12 weeks to ensure resolution. Aortic Atherosclerosis (ICD10-I70.0). Electronically Signed   By: Donavan Foil M.D.   On: 07/09/2017 17:27        Scheduled Meds: . aspirin EC  81 mg Oral Daily  . budesonide (PULMICORT) nebulizer solution  0.5 mg Nebulization BID  . enoxaparin (LOVENOX) injection  40 mg Subcutaneous QHS  . esomeprazole  20 mg Oral Q1200  . fluticasone  2 spray  Each Nare Daily  . guaiFENesin  1,200 mg Oral BID  . imipramine  75 mg Oral QHS  . insulin detemir  10 Units Subcutaneous QHS  . insulin lispro  0-20 Units Subcutaneous TID WC  . insulin lispro  0-5 Units Subcutaneous QHS  . insulin lispro  5 Units Subcutaneous Q supper  . insulin lispro  6 Units Subcutaneous BID WC  . ipratropium-albuterol  3 mL Nebulization BID  . levothyroxine  100 mcg Oral QAC breakfast  . loratadine  10 mg Oral Daily  . methylPREDNISolone (SOLU-MEDROL) injection  40 mg Intravenous Q12H  . ondansetron  4 mg Oral Once  . predniSONE  40 mg Oral QAC breakfast  . sodium chloride flush  3 mL Intravenous Q12H   Continuous Infusions: . sodium chloride    . sodium chloride 75 mL/hr at 07/10/17 0952  . azithromycin Stopped (07/10/17 0239)  . ceFEPime (MAXIPIME) IV       LOS: 1 day    Time spent: 35 minutes    Irine Seal, MD [facility] Pager [phone]  [phone]  If 7PM-7AM, please contact night-coverage [email or web address] Password [email or web address] 07/10/2017, 11:09 AM

## 2017-07-10 NOTE — Consult Note (Signed)
[facility]: Pt is an active pt with [facility] a home based Palliative Care program provided by [facility]. Pt has walker in home butt often refuses to use it. She is alert and oriented today and states she is feeling much much better.  We will continue to follow after discharge.  If you have an questions please contact pt's nurse with [facility] Olivia Mackie Donor RN at [phone]. [facility] [phone]

## 2017-07-10 NOTE — Progress Notes (Signed)
  Echocardiogram 2D Echocardiogram has been performed.  [name] 07/10/2017, 12:06 PM

## 2017-07-11 ENCOUNTER — Encounter (HOSPITAL_COMMUNITY): Payer: Self-pay | Admitting: Pulmonary Disease

## 2017-07-11 ENCOUNTER — Ambulatory Visit: Payer: Medicare Other | Admitting: Acute Care

## 2017-07-11 DIAGNOSIS — J441 Chronic obstructive pulmonary disease with (acute) exacerbation: Principal | ICD-10-CM

## 2017-07-11 LAB — BASIC METABOLIC PANEL
Anion gap: 8 (ref 5–15)
BUN: 20 mg/dL (ref 6–20)
CO2: 24 mmol/L (ref 22–32)
Calcium: 8.5 mg/dL — ABNORMAL LOW (ref 8.9–10.3)
Chloride: 98 mmol/L — ABNORMAL LOW (ref 101–111)
Creatinine, Ser: 0.81 mg/dL (ref 0.44–1.00)
GFR calc Af Amer: >60 mL/min (ref >60)
GFR calc non Af Amer: >60 mL/min (ref >60)
Glucose, Bld: 275 mg/dL — ABNORMAL HIGH (ref 65–99)
Potassium: 4.4 mmol/L (ref 3.5–5.1)
Sodium: 130 mmol/L — ABNORMAL LOW (ref 135–145)

## 2017-07-11 LAB — CBC
HCT: 35.8 % — ABNORMAL LOW (ref 36.0–46.0)
Hemoglobin: 11.7 g/dL — ABNORMAL LOW (ref 12.0–15.0)
MCH: 28.5 pg (ref 26.0–34.0)
MCHC: 32.7 g/dL (ref 30.0–36.0)
MCV: 87.1 fL (ref 78.0–100.0)
Platelets: 273 10*3/uL (ref 150–400)
RBC: 4.11 MIL/uL (ref 3.87–5.11)
RDW: 13.4 % (ref 11.5–15.5)
WBC: 22.7 10*3/uL — ABNORMAL HIGH (ref 4.0–10.5)

## 2017-07-11 LAB — GLUCOSE, CAPILLARY
Glucose-Capillary: 233 mg/dL — ABNORMAL HIGH (ref 65–99)
Glucose-Capillary: 331 mg/dL — ABNORMAL HIGH (ref 65–99)

## 2017-07-11 LAB — URINE CULTURE

## 2017-07-11 LAB — LEGIONELLA PNEUMOPHILA TOTAL AB: Legionella Pneumo Total Ab: <0.91 OD ratio (ref 0.00–0.90)

## 2017-07-11 MED ORDER — ALBUTEROL SULFATE HFA 108 (90 BASE) MCG/ACT IN AERS: 2.0000 | INHALATION_SPRAY | Freq: Four times a day (QID) | RESPIRATORY_TRACT | 0 refills | Status: DC | PRN

## 2017-07-11 MED ORDER — FLUTICASONE PROPIONATE 50 MCG/ACT NA SUSP: 1.0000 | Freq: Every day | NASAL | 0 refills | Status: AC

## 2017-07-11 MED ORDER — OXYMETAZOLINE HCL 0.05 % NA SOLN: 2.0000 | Freq: Two times a day (BID) | NASAL | 2 refills | Status: DC

## 2017-07-11 MED ORDER — PREDNISONE 5 MG PO TABS: ORAL_TABLET | ORAL | 0 refills | Status: DC

## 2017-07-11 MED ORDER — BUDESONIDE-FORMOTEROL FUMARATE 160-4.5 MCG/ACT IN AERO: 2.0000 | INHALATION_SPRAY | Freq: Two times a day (BID) | RESPIRATORY_TRACT | 0 refills | Status: DC

## 2017-07-11 NOTE — Progress Notes (Signed)
  Spoke with Dr. Sloan Leiter regarding plan of care.  Patient to be discharged today.  Will arrange for hospital follow up.     Noe Gens, NP-C [facility] Pgr: [phone] or if no answer [phone] 07/11/2017, 11:42 AM

## 2017-07-11 NOTE — Progress Notes (Addendum)
Inpatient Diabetes Program Recommendations  AACE/ADA: New Consensus Statement on Inpatient Glycemic Control (2015)  Target Ranges:  Prepandial:   less than 140 mg/dL      Peak postprandial:   less than 180 mg/dL (1-2 hours)      Critically ill patients:  140 - 180 mg/dL   Results for VALERIE, CONES (MRN [id number]) as of 07/11/2017 10:41  Ref. Range 07/10/2017 09:57 07/10/2017 13:16 07/10/2017 17:12 07/10/2017 20:53  Glucose-Capillary Latest Ref Range: 65 - 99 mg/dL 262 (H) 300 (H) 313 (H) 205 (H)   Results for RANEE, PEASLEY (MRN [id number]) as of 07/11/2017 10:41  Ref. Range 07/11/2017 07:41  Glucose-Capillary Latest Ref Range: 65 - 99 mg/dL 233 (H)    Home DM Meds: Humalog 6 units Breakfast/ 6 units Lunch/ 5 units Dinner  Current Insulin Orders: Humalog 6 units Breakfast/ 6 units Lunch/ 5 units Dinner      Humalog SSI 0-20 units TID AC + HS      Levemir 10 units QHS     Allergies to Lantus and Novolog  CBGs elevated likely due to Solumedrol 40 mg BID.   MD- Please note that patient refused Levemir last PM.      --Will follow patient during hospitalization--  [name] RN, MSN, CDE Diabetes Coordinator Inpatient Glycemic Control Team Team Pager: [phone] (8a-5p)

## 2017-07-11 NOTE — Discharge Summary (Signed)
PATIENT DETAILS Name: [name] Age: 84 y.o. Sex: female Date of Birth: [birth year] MRN: [id number]. Admitting Physician: Eugenie Filler, MD OHY:[id number], Mauro Kaufmann, MD  Admit Date: 07/09/2017 Discharge date: 07/11/2017  Recommendations for Outpatient Follow-up:  1. Follow up with PCP in 1-2 weeks 2. Please obtain BMP/CBC in one week 3. Please ensure follow-up with pulmonology 4. Follow blood cultures until final  Admitted From:  Home  Disposition: [place]: No  Equipment/Devices: None  Discharge Condition: Stable  CODE STATUS: FULL CODE  Diet recommendation:  Heart Healthy / Carb Modified   Brief Summary: See H&P, Labs, Consult and Test reports for all details in brief, patient is a 84 year old female with history of Sjogren syndrome, probable COPD-presented with three-day history of worsening shortness of breath. She did acknowledge significant upper airway symptoms-specialty rhinitis, and nasal secretions. She was subsequently admitted to the hospitalist service due to concerns for possible pneumonia. See below for further details.  Brief Hospital Course: Acute hypoxic respiratory failure: Probably secondary to acute bronchitis/COPD flare in a setting of upper airway syndrome/post nasal drip. No evidence of pneumonia on CT chest. There was no pulmonary embolism on CT chest as well. She was admitted and given bronchodilators, steroids, empiric antimicrobial therapy with significant improvement. We will transition to tapering prednisone before she resumes her usual dosing of 5 mg of prednisone daily, start as needed albuterol and scheduled Symbicort. We will continue antihistamines, change Flonase to daily dosing-and add Afrin nasal spray. She will need outpatient follow-up with her primary pulmonologist. Note echocardiogram showed preserved EF without any significant major abnormalities-furthermore she does not have any volume overload on exam.  COPD  exacerbation: See above.  Leukocytosis: Probably secondary to steroids-influenza PCR and respiratory virus panel negative. Follow blood cultures until final. She is clinically improved-and has no foci of infection apparent on exam. CT chest does not show any infiltrates or consolidation.  Mild hyponatremia: Improving-stable for outpatient follow-up.  Hypothyroidism: Continue Synthroid  DM-2: Resume usual home regimen of Humalog.  GERD: Continue PPI  Sjogren syndrome: Not sure if she is maintained on prednisone for this issue-stable for outpatient follow-up with her primary care practitioner.  Procedures/Studies: None  Discharge Diagnoses:  Principal Problem:   Hypoxia Active Problems:   Hyperlipidemia   Depression   Legal blindness   Essential hypertension   Coronary atherosclerosis   Systemic sclerosis (HCC)   Diabetes mellitus ([place])   COPD (chronic obstructive pulmonary disease) (HCC)   Anemia of chronic disease   CAP (community acquired pneumonia)   Hyponatremia   Hypothyroidism   Dehydration   Discharge Instructions:  Activity:  As tolerated  Discharge Instructions    Call MD for:  difficulty breathing, headache or visual disturbances   Complete by:  As directed    Diet - low sodium heart healthy   Complete by:  As directed    Increase activity slowly   Complete by:  As directed      Allergies as of 07/11/2017      Reactions   Sulfa Antibiotics    Other reaction(s): Other (See Comments) Other Reaction: anaphalaxsis   Lantus [insulin Glargine]    Patient states "sulfate binder causes sinus infection/pneumonia".   Levaquin [levofloxacin In D5w] Other (See Comments)   Muscle aches   Maxidex [dexamethasone] Other (See Comments)   Insomnia, anxiety, dizziness   Metoprolol-hydrochlorothiazide Other (See Comments)    decreased bp   Norvasc [amlodipine] Other (See Comments)   BIL Ankle edema  Novolog [insulin Aspart]    Patient states "sulfate binder  causes sinus infection/pneumonia". Tolerates Humalog.   Peanut Oil    Other reaction(s): Rash And throat tightening   Penciclovir    Other reaction(s): Muscle Pain   Senna [sennosides] Other (See Comments)   Pt states that it causes a stinging sensation   Sulfonamide Derivatives Hives   Titanium Other (See Comments)   Neck wouldn't heal with titanium cervical plate   Yellow Dye    Other reaction(s): Bee stings all over body when eating/taking medications with red or yellow dye    Adhesive [tape] Hives, Rash      Medication List    STOP taking these medications   amoxicillin-clavulanate 875-125 MG tablet Commonly known as:  AUGMENTIN   doxycycline 100 MG tablet Commonly known as:  VIBRA-TABS   HYDROcodone-acetaminophen 5-325 MG tablet Commonly known as:  NORCO/VICODIN     TAKE these medications   albuterol 108 (90 Base) MCG/ACT inhaler Commonly known as:  PROVENTIL HFA;VENTOLIN HFA Inhale 2 puffs into the lungs every 6 (six) hours as needed for wheezing or shortness of breath.   aspirin EC 81 MG tablet Take 1 tablet (81 mg total) by mouth daily.   budesonide-formoterol 160-4.5 MCG/ACT inhaler Commonly known as:  SYMBICORT Inhale 2 puffs into the lungs 2 (two) times daily.   cetirizine 10 MG tablet Commonly known as:  ZYRTEC Take 10 mg by mouth daily after breakfast.   esomeprazole 40 MG capsule Commonly known as:  NEXIUM Take 40 mg by mouth daily.   fluticasone 50 MCG/ACT nasal spray Commonly known as:  FLONASE Place 1 spray into both nostrils daily. What changed:    how to take this  when to take this  reasons to take this   guaiFENesin 600 MG 12 hr tablet Commonly known as:  MUCINEX Take 600 mg by mouth daily as needed for to loosen phlegm.   HUMALOG KWIKPEN 100 UNIT/ML KiwkPen Generic drug:  insulin lispro Inject 5-6 Units into the skin 3 (three) times daily with meals. Inject 6 units subcutaneously with breakfast and lunch, inject 5 units with  supper   imipramine 25 MG tablet Commonly known as:  TOFRANIL Take 75 mg by mouth at bedtime.   levothyroxine 100 MCG tablet Commonly known as:  SYNTHROID, LEVOTHROID Take 100 mcg by mouth daily before breakfast.   montelukast 10 MG tablet Commonly known as:  SINGULAIR Take 10 mg by mouth daily with breakfast.   oxymetazoline 0.05 % nasal spray Commonly known as:  AFRIN Place 2 sprays into both nostrils 2 (two) times daily for 6 doses. Stop after 6 doses   predniSONE 5 MG tablet Commonly known as:  DELTASONE Take 40 mg by mouth daily, for 2 days, then, Take 30 mg by mouth daily, for 2 days, then, Take 20 mg by mouth daily, for 2 days, then, Take 10 mg by mouth daily, for 2 days, then, Resume usual dosing of 5 mg daily. What changed:    how much to take  how to take this  when to take this  additional instructions  Another medication with the same name was removed. Continue taking this medication, and follow the directions you see here.   triazolam 0.25 MG tablet Commonly known as:  HALCION Take 0.25 mg by mouth at bedtime as needed for sleep. For sleep      Follow-up Information    Merrilee Seashore, MD. Schedule an appointment as soon as possible for a visit in  1 week(s).   Specialty:  Internal Medicine Contact information: [address] [place] [address] [phone]        Chesley Mires, MD. Schedule an appointment as soon as possible for a visit in 1 week(s).   Specialty:  Pulmonary Disease Contact information: [address] [place] [address] [phone]          Allergies  Allergen Reactions  . Sulfa Antibiotics     Other reaction(s): Other (See Comments) Other Reaction: anaphalaxsis   . Lantus [Insulin Glargine]     Patient states "sulfate binder causes sinus infection/pneumonia".  [place] [Levofloxacin In D5w] Other (See Comments)    Muscle aches  . Maxidex [Dexamethasone] Other (See Comments)     Insomnia, anxiety, dizziness  . Metoprolol-Hydrochlorothiazide Other (See Comments)     decreased bp  . Norvasc [Amlodipine] Other (See Comments)    BIL Ankle edema  . Novolog [Insulin Aspart]     Patient states "sulfate binder causes sinus infection/pneumonia". Tolerates Humalog.  . Peanut Oil     Other reaction(s): Rash And throat tightening  . Penciclovir     Other reaction(s): Muscle Pain  . Senna [Sennosides] Other (See Comments)    Pt states that it causes a stinging sensation  . Sulfonamide Derivatives Hives  . Titanium Other (See Comments)    Neck wouldn't heal with titanium cervical plate  . Yellow Dye     Other reaction(s): Bee stings all over body when eating/taking medications with red or yellow dye   . Adhesive [Tape] Hives and Rash    Consultations:   pulmonary/intensive care  Other Procedures/Studies: Dg Chest 2 View  Result Date: 07/09/2017 CLINICAL DATA:  Shortness of breath. EXAM: CHEST  2 VIEW COMPARISON:  06/27/2017 FINDINGS: Heart size and pulmonary vascularity are normal and the lungs are clear. The small patchy infiltrate seen in the right upper lobe on the prior study has resolved. No effusions. Aortic atherosclerosis. Coronary artery stents in place. Surgical clips to the left of the trachea, probably due to previous thyroid surgery. IMPRESSION: Resolution of right upper lobe infiltrate.  No acute abnormalities. Aortic atherosclerosis. Electronically Signed   By: Lorriane Shire M.D.   On: 07/09/2017 14:24   Dg Chest 2 View  Result Date: 06/27/2017 CLINICAL DATA:  Dyspnea with cough and fever for 2 weeks. History of pneumonia, diabetes and myocardial infarction. EXAM: CHEST  2 VIEW COMPARISON:  Abdominal CT 11/17/2016, chest radiographs 09/26/2016 and chest CT 06/14/2016. FINDINGS: The heart size and mediastinal contours are stable. There is no definite adenopathy. There is aortic atherosclerosis and a coronary stent. There is new multifocal right upper lobe  nodularity which is best seen on the frontal examination. There is diffuse central airway thickening. No other focal nodularity or airspace opacity identified. Postsurgical changes consistent with previous resection of the left thyroid lobe again noted. There are degenerative changes in the spine. IMPRESSION: New ill-defined right upper lobe nodularity, likely inflammatory or atypical infection. Followup PA and lateral chest X-ray is recommended in 3-4 weeks following trial of antibiotic therapy to ensure resolution and exclude underlying malignancy. Electronically Signed   By: Richardean Sale M.D.   On: 06/27/2017 15:23   Ct Angio Chest Pe W And/or Wo Contrast  Result Date: 07/09/2017 CLINICAL DATA:  Cough, shortness of breath and positive D-dimer EXAM: CT ANGIOGRAPHY CHEST WITH CONTRAST TECHNIQUE: Multidetector CT imaging of the chest was performed using the standard protocol during bolus administration of intravenous contrast. Multiplanar CT  image reconstructions and MIPs were obtained to evaluate the vascular anatomy. CONTRAST:  11mL ISOVUE-370 IOPAMIDOL (ISOVUE-370) INJECTION 76% COMPARISON:  Chest x-ray 07/09/2017, CT chest 06/14/2016 FINDINGS: Cardiovascular: Satisfactory opacification of the pulmonary arteries to the segmental level. No evidence of pulmonary embolism. Moderate aortic atherosclerosis. No aneurysmal dilatation. Normal heart size. Coronary vessel calcification. No pericardial effusion Mediastinum/Nodes: Midline trachea. No thyroid mass. Surgical changes in the region of left lobe of the thyroid. Esophagus within normal limits. No significantly enlarged mediastinal lymph nodes Lungs/Pleura: No pleural effusion. Mild ground-glass density in the posterior right upper lobe, series 10, image number 34. Mild nodular density in the lateral right upper lobe, series 10, image number 56. Upper Abdomen: No acute abnormality. Musculoskeletal: Degenerative changes. No acute or suspicious lesion Review  of the MIP images confirms the above findings. IMPRESSION: 1. Negative for acute pulmonary embolus 2. Mild ground-glass density in the posterior right upper lobe could relate to minimal residual inflammation or infiltrate. Slightly nodular density in the lateral right upper lobe, suspect that this also represents residual nodular infiltrate, however suggest short interval CT follow-up in 6-12 weeks to ensure resolution. Aortic Atherosclerosis (ICD10-I70.0). Electronically Signed   By: Donavan Foil M.D.   On: 07/09/2017 17:27      TODAY-DAY OF DISCHARGE:  Subjective:   [name] today has no headache,no chest abdominal pain,no new weakness tingling or numbness, feels much better wants to go home today.   Objective:   Blood pressure (!) 120/41, pulse 73, temperature 98.1 F (36.7 C), temperature source Oral, resp. rate 18, height 5\' 2"  (1.575 m), weight 59.8 kg (131 lb 13.4 oz), SpO2 96 %.  Intake/Output Summary (Last 24 hours) at 07/11/2017 1111 Last data filed at 07/11/2017 0900 Gross per 24 hour  Intake 2336.25 ml  Output 3 ml  Net 2333.25 ml   Filed Weights   07/09/17 1226 07/09/17 2340  Weight: 59.4 kg (131 lb) 59.8 kg (131 lb 13.4 oz)    Exam: Awake Alert, Oriented *3, No new F.N deficits, Normal affect .AT,PERRAL Supple Neck,No JVD, No cervical lymphadenopathy appriciated.  Symmetrical Chest wall movement, Good air movement bilaterally, CTAB RRR,No Gallops,Rubs or new Murmurs, No Parasternal Heave +ve B.Sounds, Abd Soft, Non tender, No organomegaly appriciated, No rebound -guarding or rigidity. No Cyanosis, Clubbing or edema, No new Rash or bruise   PERTINENT RADIOLOGIC STUDIES: Dg Chest 2 View  Result Date: 07/09/2017 CLINICAL DATA:  Shortness of breath. EXAM: CHEST  2 VIEW COMPARISON:  06/27/2017 FINDINGS: Heart size and pulmonary vascularity are normal and the lungs are clear. The small patchy infiltrate seen in the right upper lobe on the prior study has  resolved. No effusions. Aortic atherosclerosis. Coronary artery stents in place. Surgical clips to the left of the trachea, probably due to previous thyroid surgery. IMPRESSION: Resolution of right upper lobe infiltrate.  No acute abnormalities. Aortic atherosclerosis. Electronically Signed   By: Lorriane Shire M.D.   On: 07/09/2017 14:24   Dg Chest 2 View  Result Date: 06/27/2017 CLINICAL DATA:  Dyspnea with cough and fever for 2 weeks. History of pneumonia, diabetes and myocardial infarction. EXAM: CHEST  2 VIEW COMPARISON:  Abdominal CT 11/17/2016, chest radiographs 09/26/2016 and chest CT 06/14/2016. FINDINGS: The heart size and mediastinal contours are stable. There is no definite adenopathy. There is aortic atherosclerosis and a coronary stent. There is new multifocal right upper lobe nodularity which is best seen on the frontal examination. There is diffuse central airway thickening. No other focal nodularity  or airspace opacity identified. Postsurgical changes consistent with previous resection of the left thyroid lobe again noted. There are degenerative changes in the spine. IMPRESSION: New ill-defined right upper lobe nodularity, likely inflammatory or atypical infection. Followup PA and lateral chest X-ray is recommended in 3-4 weeks following trial of antibiotic therapy to ensure resolution and exclude underlying malignancy. Electronically Signed   By: Richardean Sale M.D.   On: 06/27/2017 15:23   Ct Angio Chest Pe W And/or Wo Contrast  Result Date: 07/09/2017 CLINICAL DATA:  Cough, shortness of breath and positive D-dimer EXAM: CT ANGIOGRAPHY CHEST WITH CONTRAST TECHNIQUE: Multidetector CT imaging of the chest was performed using the standard protocol during bolus administration of intravenous contrast. Multiplanar CT image reconstructions and MIPs were obtained to evaluate the vascular anatomy. CONTRAST:  122mL ISOVUE-370 IOPAMIDOL (ISOVUE-370) INJECTION 76% COMPARISON:  Chest x-ray 07/09/2017,  CT chest 06/14/2016 FINDINGS: Cardiovascular: Satisfactory opacification of the pulmonary arteries to the segmental level. No evidence of pulmonary embolism. Moderate aortic atherosclerosis. No aneurysmal dilatation. Normal heart size. Coronary vessel calcification. No pericardial effusion Mediastinum/Nodes: Midline trachea. No thyroid mass. Surgical changes in the region of left lobe of the thyroid. Esophagus within normal limits. No significantly enlarged mediastinal lymph nodes Lungs/Pleura: No pleural effusion. Mild ground-glass density in the posterior right upper lobe, series 10, image number 34. Mild nodular density in the lateral right upper lobe, series 10, image number 56. Upper Abdomen: No acute abnormality. Musculoskeletal: Degenerative changes. No acute or suspicious lesion Review of the MIP images confirms the above findings. IMPRESSION: 1. Negative for acute pulmonary embolus 2. Mild ground-glass density in the posterior right upper lobe could relate to minimal residual inflammation or infiltrate. Slightly nodular density in the lateral right upper lobe, suspect that this also represents residual nodular infiltrate, however suggest short interval CT follow-up in 6-12 weeks to ensure resolution. Aortic Atherosclerosis (ICD10-I70.0). Electronically Signed   By: Donavan Foil M.D.   On: 07/09/2017 17:27     PERTINENT LAB RESULTS: CBC: Recent Labs    07/10/17 0505 07/11/17 0916  WBC 10.5 22.7*  HGB 13.6 11.7*  HCT 40.4 35.8*  PLT 183 273   CMET CMP     Component Value Date/Time   NA 130 (L) 07/11/2017 0916   NA 135 09/22/2016 0956   K 4.4 07/11/2017 0916   CL 98 (L) 07/11/2017 0916   CO2 24 07/11/2017 0916   GLUCOSE 275 (H) 07/11/2017 0916   BUN 20 07/11/2017 0916   BUN 33 (H) 09/22/2016 0956   CREATININE 0.81 07/11/2017 0916   CREATININE 0.80 03/30/2016 1042   CALCIUM 8.5 (L) 07/11/2017 0916   PROT 6.5 11/17/2016 1510   ALBUMIN 3.8 11/17/2016 1510   AST 19 11/17/2016 1510     ALT 13 (L) 11/17/2016 1510   ALKPHOS 58 11/17/2016 1510   BILITOT 0.6 11/17/2016 1510   GFRNONAA >60 07/11/2017 0916   GFRNONAA 64 01/22/2015 1635   GFRAA >60 07/11/2017 0916   GFRAA 74 01/22/2015 1635    GFR Estimated Creatinine Clearance: 40.9 mL/min (by C-G formula based on SCr of 0.81 mg/dL). No results for input(s): LIPASE, AMYLASE in the last 72 hours. Recent Labs    07/09/17 2059 07/10/17 0516 07/10/17 0843  TROPONINI <0.03 <0.03 <0.03   Invalid input(s): POCBNP Recent Labs    07/09/17 1456  DDIMER 0.83*   Recent Labs    07/10/17 0516  HGBA1C 8.2*   No results for input(s): CHOL, HDL, LDLCALC, TRIG, CHOLHDL, LDLDIRECT in the  last 72 hours. Recent Labs    07/10/17 0505  TSH 2.354   No results for input(s): VITAMINB12, FOLATE, FERRITIN, TIBC, IRON, RETICCTPCT in the last 72 hours. Coags: No results for input(s): INR in the last 72 hours.  Invalid input(s): PT Microbiology: Recent Results (from the past 240 hour(s))  Respiratory Panel by PCR     Status: None   Collection Time: 07/09/17 12:55 AM  Result Value Ref Range Status   Adenovirus NOT DETECTED NOT DETECTED Final   Coronavirus 229E NOT DETECTED NOT DETECTED Final   Coronavirus HKU1 NOT DETECTED NOT DETECTED Final   Coronavirus NL63 NOT DETECTED NOT DETECTED Final   Coronavirus OC43 NOT DETECTED NOT DETECTED Final   Metapneumovirus NOT DETECTED NOT DETECTED Final   Rhinovirus / Enterovirus NOT DETECTED NOT DETECTED Final   Influenza A NOT DETECTED NOT DETECTED Final   Influenza B NOT DETECTED NOT DETECTED Final   Parainfluenza Virus 1 NOT DETECTED NOT DETECTED Final   Parainfluenza Virus 2 NOT DETECTED NOT DETECTED Final   Parainfluenza Virus 3 NOT DETECTED NOT DETECTED Final   Parainfluenza Virus 4 NOT DETECTED NOT DETECTED Final   Respiratory Syncytial Virus NOT DETECTED NOT DETECTED Final   Bordetella pertussis NOT DETECTED NOT DETECTED Final   Chlamydophila pneumoniae NOT DETECTED NOT  DETECTED Final   Mycoplasma pneumoniae NOT DETECTED NOT DETECTED Final  Urine culture     Status: Abnormal   Collection Time: 07/09/17  1:32 PM  Result Value Ref Range Status   Specimen Description URINE, RANDOM  Final   Special Requests NONE  Final   Culture MULTIPLE SPECIES PRESENT, SUGGEST RECOLLECTION (A)  Final   Report Status 07/11/2017 FINAL  Final  MRSA PCR Screening     Status: None   Collection Time: 07/10/17 12:55 AM  Result Value Ref Range Status   MRSA by PCR NEGATIVE NEGATIVE Final    Comment:        The GeneXpert MRSA Assay (FDA approved for NASAL specimens only), is one component of a comprehensive MRSA colonization surveillance program. It is not intended to diagnose MRSA infection nor to guide or monitor treatment for MRSA infections.     FURTHER DISCHARGE INSTRUCTIONS:  Get Medicines reviewed and adjusted: Please take all your medications with you for your next visit with your Primary MD  Laboratory/radiological data: Please request your Primary MD to go over all hospital tests and procedure/radiological results at the follow up, please ask your Primary MD to get all Hospital records sent to his/her office.  In some cases, they will be blood work, cultures and biopsy results pending at the time of your discharge. Please request that your primary care M.D. goes through all the records of your hospital data and follows up on these results.  Also Note the following: If you experience worsening of your admission symptoms, develop shortness of breath, life threatening emergency, suicidal or homicidal thoughts you must seek medical attention immediately by calling 911 or calling your MD immediately  if symptoms less severe.  You must read complete instructions/literature along with all the possible adverse reactions/side effects for all the Medicines you take and that have been prescribed to you. Take any new Medicines after you have completely understood and  accpet all the possible adverse reactions/side effects.   Do not drive when taking Pain medications or sleeping medications (Benzodaizepines)  Do not take more than prescribed Pain, Sleep and Anxiety Medications. It is not advisable to combine anxiety,sleep and pain medications without  talking with your primary care practitioner  Special Instructions: If you have smoked or chewed Tobacco  in the last 2 yrs please stop smoking, stop any regular Alcohol  and or any Recreational drug use.  Wear Seat belts while driving.  Please note: You were cared for by a hospitalist during your hospital stay. Once you are discharged, your primary care physician will handle any further medical issues. Please note that NO REFILLS for any discharge medications will be authorized once you are discharged, as it is imperative that you return to your primary care physician (or establish a relationship with a primary care physician if you do not have one) for your post hospital discharge needs so that they can reassess your need for medications and monitor your lab values.  Total Time spent coordinating discharge including counseling, education and face to face time equals 35 minutes.  Signed: Zaire Vanbuskirk 07/11/2017 11:11 AM

## 2017-07-13 ENCOUNTER — Encounter (HOSPITAL_COMMUNITY): Payer: Self-pay

## 2017-07-13 ENCOUNTER — Emergency Department (HOSPITAL_COMMUNITY): Payer: Medicare Other

## 2017-07-13 ENCOUNTER — Other Ambulatory Visit: Payer: Self-pay

## 2017-07-13 ENCOUNTER — Inpatient Hospital Stay (HOSPITAL_COMMUNITY)
Admission: EM | Admit: 2017-07-13 | Discharge: 2017-07-16 | DRG: 197 | Disposition: A | Discharge status: Home / Self Care | Payer: Medicare Other | Attending: Internal Medicine | Admitting: Internal Medicine

## 2017-07-13 DIAGNOSIS — R0602 Shortness of breath: Secondary | ICD-10-CM | POA: Diagnosis present

## 2017-07-13 DIAGNOSIS — Z888 Allergy status to other drugs, medicaments and biological substances status: Secondary | ICD-10-CM | POA: Diagnosis not present

## 2017-07-13 DIAGNOSIS — Z91018 Allergy to other foods: Secondary | ICD-10-CM

## 2017-07-13 DIAGNOSIS — J449 Chronic obstructive pulmonary disease, unspecified: Secondary | ICD-10-CM | POA: Diagnosis present

## 2017-07-13 DIAGNOSIS — Z8541 Personal history of malignant neoplasm of cervix uteri: Secondary | ICD-10-CM | POA: Diagnosis not present

## 2017-07-13 DIAGNOSIS — Z8673 Personal history of transient ischemic attack (TIA), and cerebral infarction without residual deficits: Secondary | ICD-10-CM | POA: Diagnosis not present

## 2017-07-13 DIAGNOSIS — Z86010 Personal history of colonic polyps: Secondary | ICD-10-CM | POA: Diagnosis not present

## 2017-07-13 DIAGNOSIS — E119 Type 2 diabetes mellitus without complications: Secondary | ICD-10-CM | POA: Diagnosis present

## 2017-07-13 DIAGNOSIS — K219 Gastro-esophageal reflux disease without esophagitis: Secondary | ICD-10-CM | POA: Diagnosis not present

## 2017-07-13 DIAGNOSIS — I272 Pulmonary hypertension, unspecified: Secondary | ICD-10-CM | POA: Diagnosis present

## 2017-07-13 DIAGNOSIS — I2510 Atherosclerotic heart disease of native coronary artery without angina pectoris: Secondary | ICD-10-CM | POA: Diagnosis present

## 2017-07-13 DIAGNOSIS — Z23 Encounter for immunization: Secondary | ICD-10-CM

## 2017-07-13 DIAGNOSIS — G4730 Sleep apnea, unspecified: Secondary | ICD-10-CM | POA: Diagnosis not present

## 2017-07-13 DIAGNOSIS — R079 Chest pain, unspecified: Secondary | ICD-10-CM | POA: Diagnosis not present

## 2017-07-13 DIAGNOSIS — I10 Essential (primary) hypertension: Secondary | ICD-10-CM | POA: Diagnosis not present

## 2017-07-13 DIAGNOSIS — I25119 Atherosclerotic heart disease of native coronary artery with unspecified angina pectoris: Secondary | ICD-10-CM | POA: Diagnosis not present

## 2017-07-13 DIAGNOSIS — J441 Chronic obstructive pulmonary disease with (acute) exacerbation: Secondary | ICD-10-CM | POA: Diagnosis present

## 2017-07-13 DIAGNOSIS — I252 Old myocardial infarction: Secondary | ICD-10-CM | POA: Diagnosis not present

## 2017-07-13 DIAGNOSIS — I25110 Atherosclerotic heart disease of native coronary artery with unstable angina pectoris: Secondary | ICD-10-CM | POA: Diagnosis not present

## 2017-07-13 DIAGNOSIS — Z882 Allergy status to sulfonamides status: Secondary | ICD-10-CM

## 2017-07-13 DIAGNOSIS — M797 Fibromyalgia: Secondary | ICD-10-CM | POA: Diagnosis present

## 2017-07-13 DIAGNOSIS — Z90710 Acquired absence of both cervix and uterus: Secondary | ICD-10-CM | POA: Diagnosis not present

## 2017-07-13 DIAGNOSIS — R069 Unspecified abnormalities of breathing: Secondary | ICD-10-CM | POA: Diagnosis not present

## 2017-07-13 DIAGNOSIS — H3530 Unspecified macular degeneration: Secondary | ICD-10-CM | POA: Diagnosis present

## 2017-07-13 DIAGNOSIS — Z91048 Other nonmedicinal substance allergy status: Secondary | ICD-10-CM

## 2017-07-13 DIAGNOSIS — J8489 Other specified interstitial pulmonary diseases: Principal | ICD-10-CM | POA: Diagnosis present

## 2017-07-13 DIAGNOSIS — E039 Hypothyroidism, unspecified: Secondary | ICD-10-CM | POA: Diagnosis not present

## 2017-07-13 DIAGNOSIS — R0609 Other forms of dyspnea: Secondary | ICD-10-CM | POA: Diagnosis not present

## 2017-07-13 LAB — COMPREHENSIVE METABOLIC PANEL
ALT: 20 U/L (ref 14–54)
AST: 21 U/L (ref 15–41)
Albumin: 3.3 g/dL — ABNORMAL LOW (ref 3.5–5.0)
Alkaline Phosphatase: 69 U/L (ref 38–126)
Anion gap: 13 (ref 5–15)
BUN: 20 mg/dL (ref 6–20)
CO2: 27 mmol/L (ref 22–32)
Calcium: 8.7 mg/dL — ABNORMAL LOW (ref 8.9–10.3)
Chloride: 98 mmol/L — ABNORMAL LOW (ref 101–111)
Creatinine, Ser: 0.7 mg/dL (ref 0.44–1.00)
GFR calc Af Amer: >60 mL/min (ref >60)
GFR calc non Af Amer: >60 mL/min (ref >60)
Glucose, Bld: 184 mg/dL — ABNORMAL HIGH (ref 65–99)
Potassium: 3.6 mmol/L (ref 3.5–5.1)
Sodium: 138 mmol/L (ref 135–145)
Total Bilirubin: 0.6 mg/dL (ref 0.3–1.2)
Total Protein: 6.5 g/dL (ref 6.5–8.1)

## 2017-07-13 LAB — URINALYSIS, ROUTINE W REFLEX MICROSCOPIC
Bacteria, UA: NONE SEEN
Bilirubin Urine: NEGATIVE
Glucose, UA: NEGATIVE mg/dL
Hgb urine dipstick: NEGATIVE
Ketones, ur: NEGATIVE mg/dL
Nitrite: NEGATIVE
Protein, ur: NEGATIVE mg/dL
Specific Gravity, Urine: 1.008 (ref 1.005–1.030)
Squamous Epithelial / LPF: NONE SEEN
pH: 7 (ref 5.0–8.0)

## 2017-07-13 LAB — CBC WITH DIFFERENTIAL/PLATELET
Basophils Absolute: 0 10*3/uL (ref 0.0–0.1)
Basophils Relative: 0 %
Eosinophils Absolute: 0.1 10*3/uL (ref 0.0–0.7)
Eosinophils Relative: 1 %
HCT: 44 % (ref 36.0–46.0)
Hemoglobin: 14.5 g/dL (ref 12.0–15.0)
Lymphocytes Relative: 23 %
Lymphs Abs: 3.2 10*3/uL (ref 0.7–4.0)
MCH: 28.7 pg (ref 26.0–34.0)
MCHC: 33 g/dL (ref 30.0–36.0)
MCV: 87.1 fL (ref 78.0–100.0)
Monocytes Absolute: 0.8 10*3/uL (ref 0.1–1.0)
Monocytes Relative: 6 %
Neutro Abs: 10.2 10*3/uL — ABNORMAL HIGH (ref 1.7–7.7)
Neutrophils Relative %: 70 %
Platelets: 251 10*3/uL (ref 150–400)
RBC: 5.05 MIL/uL (ref 3.87–5.11)
RDW: 13.8 % (ref 11.5–15.5)
WBC: 14.4 10*3/uL — ABNORMAL HIGH (ref 4.0–10.5)

## 2017-07-13 LAB — TROPONIN I: Troponin I: 0.15 ng/mL (ref <0.03)

## 2017-07-13 LAB — I-STAT TROPONIN, ED
Troponin i, poc: 0.07 ng/mL (ref 0.00–0.08)
Troponin i, poc: 0.18 ng/mL (ref 0.00–0.08)

## 2017-07-13 LAB — BRAIN NATRIURETIC PEPTIDE: B Natriuretic Peptide: 214.7 pg/mL — ABNORMAL HIGH (ref 0.0–100.0)

## 2017-07-13 LAB — CBG MONITORING, ED: Glucose-Capillary: 229 mg/dL — ABNORMAL HIGH (ref 65–99)

## 2017-07-13 MED ORDER — SODIUM CHLORIDE 0.9% FLUSH
3.0000 mL | Freq: Two times a day (BID) | INTRAVENOUS | Status: DC
  Administered 2017-07-15 – 2017-07-16 (×3): 3 mL via INTRAVENOUS

## 2017-07-13 MED ORDER — NITROGLYCERIN 0.4 MG SL SUBL: 0.4000 mg | SUBLINGUAL_TABLET | SUBLINGUAL | Status: DC | PRN

## 2017-07-13 MED ORDER — ALPRAZOLAM 0.25 MG PO TABS: 0.2500 mg | ORAL_TABLET | Freq: Two times a day (BID) | ORAL | Status: DC | PRN

## 2017-07-13 MED ORDER — HYDROCODONE-ACETAMINOPHEN 7.5-325 MG PO TABS: 1.0000 | ORAL_TABLET | Freq: Four times a day (QID) | ORAL | Status: DC | PRN

## 2017-07-13 MED ORDER — LEVOTHYROXINE SODIUM 100 MCG PO TABS
100.0000 ug | ORAL_TABLET | Freq: Every day | ORAL | Status: DC
  Administered 2017-07-14 – 2017-07-16 (×3): 100 ug via ORAL
  Filled 2017-07-13 (×4): qty 1

## 2017-07-13 MED ORDER — ASPIRIN EC 81 MG PO TBEC
81.0000 mg | DELAYED_RELEASE_TABLET | Freq: Every day | ORAL | Status: DC
  Administered 2017-07-13 – 2017-07-16 (×4): 81 mg via ORAL
  Filled 2017-07-13 (×4): qty 1

## 2017-07-13 MED ORDER — LORATADINE 10 MG PO TABS
10.0000 mg | ORAL_TABLET | Freq: Every day | ORAL | Status: DC
  Administered 2017-07-13 – 2017-07-16 (×4): 10 mg via ORAL
  Filled 2017-07-13 (×4): qty 1

## 2017-07-13 MED ORDER — SODIUM CHLORIDE 0.9 % IV BOLUS (SEPSIS)
500.0000 mL | Freq: Once | INTRAVENOUS | Status: AC
Start: 2017-07-13 — End: 2017-07-13
  Administered 2017-07-13: 500 mL via INTRAVENOUS

## 2017-07-13 MED ORDER — ACETAMINOPHEN 325 MG PO TABS: 650.0000 mg | ORAL_TABLET | ORAL | Status: DC | PRN

## 2017-07-13 MED ORDER — ASPIRIN 81 MG PO CHEW
324.0000 mg | CHEWABLE_TABLET | Freq: Once | ORAL | Status: AC
  Administered 2017-07-13: 324 mg via ORAL
  Filled 2017-07-13: qty 4

## 2017-07-13 MED ORDER — IMIPRAMINE HCL 50 MG PO TABS
75.0000 mg | ORAL_TABLET | Freq: Every day | ORAL | Status: DC
  Administered 2017-07-13 – 2017-07-15 (×3): 75 mg via ORAL
  Filled 2017-07-13 (×4): qty 1

## 2017-07-13 MED ORDER — GUAIFENESIN ER 600 MG PO TB12
600.0000 mg | ORAL_TABLET | Freq: Every day | ORAL | Status: DC | PRN
  Administered 2017-07-13: 600 mg via ORAL
  Filled 2017-07-13: qty 1

## 2017-07-13 MED ORDER — HEPARIN (PORCINE) IN NACL 100-0.45 UNIT/ML-% IJ SOLN
1050.0000 [IU]/h | INTRAMUSCULAR | Status: DC
  Administered 2017-07-13: 750 [IU]/h via INTRAVENOUS
  Administered 2017-07-14: 850 [IU]/h via INTRAVENOUS
  Filled 2017-07-13 (×2): qty 250

## 2017-07-13 MED ORDER — PANTOPRAZOLE SODIUM 40 MG PO TBEC
40.0000 mg | DELAYED_RELEASE_TABLET | Freq: Every day | ORAL | Status: DC
  Administered 2017-07-13 – 2017-07-16 (×4): 40 mg via ORAL
  Filled 2017-07-13 (×4): qty 1

## 2017-07-13 MED ORDER — FLUTICASONE PROPIONATE 50 MCG/ACT NA SUSP
1.0000 | Freq: Every day | NASAL | Status: DC
  Filled 2017-07-13 (×2): qty 16

## 2017-07-13 MED ORDER — INSULIN ASPART 100 UNIT/ML ~~LOC~~ SOLN: 5.0000 [IU] | Freq: Three times a day (TID) | SUBCUTANEOUS | Status: DC

## 2017-07-13 MED ORDER — INFLUENZA VAC SPLIT HIGH-DOSE 0.5 ML IM SUSY
0.5000 mL | PREFILLED_SYRINGE | INTRAMUSCULAR | Status: AC
  Administered 2017-07-14: 0.5 mL via INTRAMUSCULAR
  Filled 2017-07-13: qty 0.5

## 2017-07-13 MED ORDER — ONDANSETRON HCL 4 MG/2ML IJ SOLN: 4.0000 mg | Freq: Four times a day (QID) | INTRAMUSCULAR | Status: DC | PRN

## 2017-07-13 MED ORDER — INSULIN ASPART 100 UNIT/ML ~~LOC~~ SOLN: 0.0000 [IU] | Freq: Every day | SUBCUTANEOUS | Status: DC

## 2017-07-13 MED ORDER — INSULIN ASPART 100 UNIT/ML ~~LOC~~ SOLN: 0.0000 [IU] | Freq: Three times a day (TID) | SUBCUTANEOUS | Status: DC

## 2017-07-13 MED ORDER — ZOLPIDEM TARTRATE 5 MG PO TABS
5.0000 mg | ORAL_TABLET | Freq: Every evening | ORAL | Status: DC | PRN
  Administered 2017-07-13: 5 mg via ORAL
  Filled 2017-07-13: qty 1

## 2017-07-13 MED ORDER — TRIAZOLAM 0.125 MG PO TABS
0.2500 mg | ORAL_TABLET | Freq: Every evening | ORAL | Status: DC | PRN
  Administered 2017-07-14 – 2017-07-15 (×2): 0.25 mg via ORAL
  Filled 2017-07-13 (×3): qty 2

## 2017-07-13 MED ORDER — SODIUM CHLORIDE 0.9 % IV SOLN: 250.0000 mL | INTRAVENOUS | Status: DC | PRN

## 2017-07-13 MED ORDER — SODIUM CHLORIDE 0.9% FLUSH: 3.0000 mL | INTRAVENOUS | Status: DC | PRN

## 2017-07-13 MED ORDER — FUROSEMIDE 10 MG/ML IJ SOLN
20.0000 mg | Freq: Once | INTRAMUSCULAR | Status: AC
  Administered 2017-07-13: 20 mg via INTRAVENOUS
  Filled 2017-07-13: qty 2

## 2017-07-13 MED ORDER — ALBUTEROL SULFATE (2.5 MG/3ML) 0.083% IN NEBU: 2.5000 mg | INHALATION_SOLUTION | Freq: Four times a day (QID) | RESPIRATORY_TRACT | Status: DC | PRN

## 2017-07-13 MED ORDER — MONTELUKAST SODIUM 10 MG PO TABS
10.0000 mg | ORAL_TABLET | Freq: Every day | ORAL | Status: DC
  Administered 2017-07-13 – 2017-07-15 (×3): 10 mg via ORAL
  Filled 2017-07-13 (×3): qty 1

## 2017-07-13 MED ORDER — HEPARIN BOLUS VIA INFUSION
3500.0000 [IU] | Freq: Once | INTRAVENOUS | Status: AC
  Administered 2017-07-13: 3500 [IU] via INTRAVENOUS
  Filled 2017-07-13: qty 3500

## 2017-07-13 NOTE — ED Notes (Signed)
Mia, PA at bedside at this time.

## 2017-07-13 NOTE — ED Notes (Signed)
Checked [facility] 229 RN Jessica informed

## 2017-07-13 NOTE — Progress Notes (Signed)
ANTICOAGULATION CONSULT NOTE - Initial Consult  Pharmacy Consult for heparin Indication: chest pain/ACS  Allergies  Allergen Reactions  . Sulfa Antibiotics     Other reaction(s): Other (See Comments) Other Reaction: anaphalaxsis   . Lantus [Insulin Glargine]     Patient states "sulfate binder causes sinus infection/pneumonia".  Mack Hook [Levofloxacin In D5w] Other (See Comments)    Muscle aches  . Maxidex [Dexamethasone] Other (See Comments)    Insomnia, anxiety, dizziness  . Metoprolol-Hydrochlorothiazide Other (See Comments)     decreased bp  . Norvasc [Amlodipine] Other (See Comments)    BIL Ankle edema  . Novolog [Insulin Aspart]     Patient states "sulfate binder causes sinus infection/pneumonia". Tolerates Humalog.  . Peanut Oil     Other reaction(s): Rash And throat tightening  . Penciclovir     Other reaction(s): Muscle Pain  . Senna [Sennosides] Other (See Comments)    Pt states that it causes a stinging sensation  . Sulfonamide Derivatives Hives  . Titanium Other (See Comments)    Neck wouldn't heal with titanium cervical plate  . Yellow Dye     Other reaction(s): Bee stings all over body when eating/taking medications with red or yellow dye   . Adhesive [Tape] Hives and Rash    Patient Measurements:   Heparin Dosing Weight: 59.8kg  Vital Signs: BP: 128/80 (01/18 1545) Pulse Rate: 72 (01/18 1545)  Labs: Recent Labs    07/11/17 0916 07/13/17 1047  HGB 11.7* 14.5  HCT 35.8* 44.0  PLT 273 251  CREATININE 0.81 0.70    Estimated Creatinine Clearance: 41.4 mL/min (by C-G formula based on SCr of 0.7 mg/dL).   Medical History: Past Medical History:  Diagnosis Date  . Adenomatous colon polyp   . Arthritis   . Blood transfusion   . Cancer [facility])    cervical cancer pre hysterectomy  . Cervical disc disease   . Chronic sinusitis   . CONSTIPATION, CHRONIC 06/26/2007   Qualifier: Diagnosis of  By: Nils Pyle CMA ([place]), Mearl Latin    . COPD (chronic  obstructive pulmonary disease) ([place])   . Coronary artery disease   . CVA (cerebral infarction) 1991  . Depression   . Diabetes mellitus   . Diverticulosis   . 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 Dyslipidemia   . Emphysema   . Fibromyalgia   . GERD (gastroesophageal reflux disease)   . Hypertension   . Hypothyroidism   . IBS (irritable bowel syndrome)   . Legally blind   . Macular degeneration   . Myocardial infarction [facility]) may 27th 2007  . Pneumonia 07/16/2013    HX PNEUMONIA  . Pulmonary hypertension ([place])   . Raynaud's phenomenon   . Scleroderma ([place])   . Sjogren's disease ([place])   . Sleep apnea    STOP BANG SCORE 5  . Stroke [facility]) 1990   brain stem stroke - weakness rt hand  . Zenker's diverticulum     Medications:  Infusions:  . heparin      Assessment: 10 yof presented to the ED with SOB and diaphoresis. Baseline H/H and platelets are WNL. Troponin is mildly elevated at 0.18. She is not on anticoagulation PTA.   Goal of Therapy:  Heparin level 0.3-0.7 units/ml Monitor platelets by anticoagulation protocol: Yes   Plan:  Heparin bolus 3500 units IV x 1 Heparin gtt 750 units/hr Check an 8 hr heparin level Daily heparin level and CBC  Libertie Hausler, Rande Lawman 07/13/2017,4:08 PM

## 2017-07-13 NOTE — ED Notes (Signed)
Phlebotomy at bedside at this time.

## 2017-07-13 NOTE — ED Notes (Signed)
Pt stated she has to urinate. Per RN, pt can be placed on a purewik. Pt informed about the device, tolerated well.

## 2017-07-13 NOTE — ED Notes (Signed)
Attempted to call report

## 2017-07-13 NOTE — Consult Note (Addendum)
Primary Physician: Primary Cardiologist:  Harrington Challenger  Pt presents for CP   Asked to see by ER provider Mia McDonal PA   HPI:  Pt is an 84 yo  With a history of systemic sclerosis, BOOP  , HTN, DM, COPD, hypothyroidism and CAD (s/p IWMI with stent ot RCA. repeat stnet to the RCA for instent restenois. She is also s/p DES to L main. Last cath In summer 2011 showed: 40% LAD lesion; 50% Lcx; small OM1 with 80% lesion; RCA with 20% instent restenosis  I last saw the in cardiology clnic in March 2018  I also saw her at [facility] right before Christmas  She was doing very good at the time  A few days after Christmas she got sick  SOB  Fever.  Seen by PCP  She was put on antibiotics (Augmentin)    Didn't get better  Seen in pulmonary clnicon 06/27/17  Felt weaker, sitill SOB  ABX switched  toDoxy  Placed on steroids.    She continued to feel bad.  Was admtted on 1/14 to 1/16  Flet to have acute bronchities, COPD flare.  Rx with empiric ABX in hosp  , steroids and inhalors  Per pts report she says her breathing never got better  She was sent home   Not on ABX Since the 16th she has continued to feel bad  Weak  SOB  Some Back pain  No CP  No nausea  (an anginal equivalent.  She has also had SOB and sweats with angina before )   Called EMS  Currently mild SOB  No CP  No back pain  No indigestion   Past Medical History:  Diagnosis Date  . Adenomatous colon polyp   . Arthritis   . Blood transfusion   . Cancer [facility])    cervical cancer pre hysterectomy  . Cervical disc disease   . Chronic sinusitis   . CONSTIPATION, CHRONIC 06/26/2007   Qualifier: Diagnosis of  By: Nils Pyle CMA ([place]), Mearl Latin    . COPD (chronic obstructive pulmonary disease) ([place])   . Coronary artery disease   . CVA (cerebral infarction) 1991  . Depression   . Diabetes mellitus   . Diverticulosis   . 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 Dyslipidemia   . Emphysema   . Fibromyalgia   . GERD (gastroesophageal reflux disease)   . Hypertension     . Hypothyroidism   . IBS (irritable bowel syndrome)   . Legally blind   . Macular degeneration   . Myocardial infarction [facility]) may 27th 2007  . Pneumonia 07/16/2013    HX PNEUMONIA  . Pulmonary hypertension ([place])   . Raynaud's phenomenon   . Scleroderma ([place])   . Sjogren's disease ([place])   . Sleep apnea    STOP BANG SCORE 5  . Stroke [facility]) 1990   brain stem stroke - weakness rt hand  . Zenker's diverticulum      (Not in a hospital admission)     Infusions: . heparin 750 Units/hr (07/13/17 1621)    Allergies  Allergen Reactions  . Sulfa Antibiotics     Other reaction(s): Other (See Comments) Other Reaction: anaphalaxsis   . Lantus [Insulin Glargine]     Patient states "sulfate binder causes sinus infection/pneumonia".  Mack Hook [Levofloxacin In D5w] Other (See Comments)    Muscle aches  . Maxidex [Dexamethasone] Other (See Comments)    Insomnia, anxiety, dizziness  . Metoprolol-Hydrochlorothiazide Other (See Comments)  decreased bp  . Norvasc [Amlodipine] Other (See Comments)    BIL Ankle edema  . Novolog [Insulin Aspart]     Patient states "sulfate binder causes sinus infection/pneumonia". Tolerates Humalog.  . Peanut Oil     Other reaction(s): Rash And throat tightening  . Penciclovir     Other reaction(s): Muscle Pain  . Senna [Sennosides] Other (See Comments)    Pt states that it causes a stinging sensation  . Sulfonamide Derivatives Hives  . Titanium Other (See Comments)    Neck wouldn't heal with titanium cervical plate  . Yellow Dye     Other reaction(s): Bee stings all over body when eating/taking medications with red or yellow dye   . Adhesive [Tape] Hives and Rash    Social History   Socioeconomic History  . Marital status: Divorced    Spouse name: Not on file  . Number of children: Not on file  . Years of education: Not on file  . Highest education level: Not on file  Social Needs  . Financial resource strain: Not on file  . Food  insecurity - worry: Not on file  . Food insecurity - inability: Not on file  . Transportation needs - medical: Not on file  . Transportation needs - non-medical: Not on file  Occupational History  . Occupation: retired    Fish farm manager: RETIRED  Tobacco Use  . Smoking status: Former Smoker    Packs/day: 1.00    Years: 40.00    Pack years: 40.00    Types: Cigarettes    Last attempt to quit: 06/26/1988    Years since quitting: 29.0  . Smokeless tobacco: Never Used  . Tobacco comment: 40 pack years  Substance and Sexual Activity  . Alcohol use: No  . Drug use: No  . Sexual activity: Not on file  Other Topics Concern  . Not on file  Social History Narrative  . Not on file    Family History  Problem Relation Age of Onset  . Other Mother 80       Died from sepsis  . Stroke Father 36       died  . Heart disease Father   . Diabetes Father   . Cancer Sister        lung  . Diabetes Sister   . Cancer Brother        lung  . Diabetes Brother   . Diabetes Brother     REVIEW OF SYSTEMS:  All systems reviewed  Negative to the above problem except as noted above.    PHYSICAL EXAM: Vitals:   07/13/17 1615 07/13/17 1630  BP: (!) 159/65 (!) 149/56  Pulse: 70 75  Resp: 12 14  SpO2: 97% 98%    No intake or output data in the 24 hours ending 07/13/17 1749  General:  Elderly woman in NAD   HEENT: normal Neck: supple. no JVD. Carotids 2+ bilat; no bruits. No lymphadenopathy or thryomegaly appreciated. Cor: PMI nondisplaced. Regular rate & rhythm. No rubs, gallops or murmurs. Lungs: Mild rales throughout with mild wheeze Abdomen: soft, nontender, nondistended. No hepatosplenomegaly. No bruits or masses. Good bowel sounds. Extremities: no cyanosis, clubbing, rash, edema Neuro: alert & oriented x 3, cranial nerves grossly intact. moves all 4 extremities w/o difficulty. Affect pleasant.  ECG:  SR 79 bpm  Nonspecific ST T wave changes    Results for orders placed or performed during  the hospital encounter of 07/13/17 (from the past 24 hour(s))  CBC  with Differential     Status: Abnormal   Collection Time: 07/13/17 10:47 AM  Result Value Ref Range   WBC 14.4 (H) 4.0 - 10.5 K/uL   RBC 5.05 3.87 - 5.11 MIL/uL   Hemoglobin 14.5 12.0 - 15.0 g/dL   HCT 44.0 36.0 - 46.0 %   MCV 87.1 78.0 - 100.0 fL   MCH 28.7 26.0 - 34.0 pg   MCHC 33.0 30.0 - 36.0 g/dL   RDW 13.8 11.5 - 15.5 %   Platelets 251 150 - 400 K/uL   Neutrophils Relative % 70 %   Neutro Abs 10.2 (H) 1.7 - 7.7 K/uL   Lymphocytes Relative 23 %   Lymphs Abs 3.2 0.7 - 4.0 K/uL   Monocytes Relative 6 %   Monocytes Absolute 0.8 0.1 - 1.0 K/uL   Eosinophils Relative 1 %   Eosinophils Absolute 0.1 0.0 - 0.7 K/uL   Basophils Relative 0 %   Basophils Absolute 0.0 0.0 - 0.1 K/uL  Comprehensive metabolic panel     Status: Abnormal   Collection Time: 07/13/17 10:47 AM  Result Value Ref Range   Sodium 138 135 - 145 mmol/L   Potassium 3.6 3.5 - 5.1 mmol/L   Chloride 98 (L) 101 - 111 mmol/L   CO2 27 22 - 32 mmol/L   Glucose, Bld 184 (H) 65 - 99 mg/dL   BUN 20 6 - 20 mg/dL   Creatinine, Ser 0.70 0.44 - 1.00 mg/dL   Calcium 8.7 (L) 8.9 - 10.3 mg/dL   Total Protein 6.5 6.5 - 8.1 g/dL   Albumin 3.3 (L) 3.5 - 5.0 g/dL   AST 21 15 - 41 U/L   ALT 20 14 - 54 U/L   Alkaline Phosphatase 69 38 - 126 U/L   Total Bilirubin 0.6 0.3 - 1.2 mg/dL   GFR calc non Af Amer >60 >60 mL/min   GFR calc Af Amer >60 >60 mL/min   Anion gap 13 5 - 15  Brain natriuretic peptide     Status: Abnormal   Collection Time: 07/13/17 10:47 AM  Result Value Ref Range   B Natriuretic Peptide 214.7 (H) 0.0 - 100.0 pg/mL  I-Stat Troponin, ED (not at [facility])     Status: None   Collection Time: 07/13/17 11:19 AM  Result Value Ref Range   Troponin i, poc 0.07 0.00 - 0.08 ng/mL   Comment 3          I-Stat Troponin, ED (not at [facility])     Status: Abnormal   Collection Time: 07/13/17  2:20 PM  Result Value Ref Range   Troponin i, poc 0.18 (HH) 0.00 - 0.08  ng/mL   Comment NOTIFIED PHYSICIAN    Comment 3           Dg Chest 2 View  Result Date: 07/13/2017 CLINICAL DATA:  Shortness of breath. EXAM: CHEST  2 VIEW COMPARISON:  Chest x-ray and CT chest dated July 09, 2017. FINDINGS: The heart size and mediastinal contours are within normal limits. Normal pulmonary vascularity. Slightly coarsened interstitial markings are similar to prior study. No focal consolidation, pleural effusion, or pneumothorax. No acute osseous abnormality. IMPRESSION: No active cardiopulmonary disease. Electronically Signed   By: Titus Dubin M.D.   On: 07/13/2017 12:41     ASSESSMENT:  Pt is an 84 yo who presents with SOB and weakness  She has been feeling sick for the past few wks (since right after Christmas)  No improvement despite ABX (couple courses)  Today, broke out into a sweat  Concerned it may be angina  Came to ED  On exam, pt with mild rales,  She does have long disease, but sounds are more prominent than when I saw her in clinic  EKG without acute chagnes  Labs signif for mild elevation in BNP and troponin     Pt has stents,to RCA and LM  Had instent restenosis of RCA in past     1  Dyspnea, fatigue  Admit to tele.  Rx with heparin and ASA Cycle troponin levels Give lasix x 1  Strict I/O If she does not improve I have a low threshold to recomm L heart cath as she has failed pulmonary Rx.  2  CAD  As above   3  Pulmonary   Hx BOOP   Continue steroid taper.   Follow symtposm as diurese   4  HTN  Follow   5  DM    6.  HL   Intolerant to statins  Have not addressed repatha yet  7  Hypothyroid   Continue meds    Dorris Carnes

## 2017-07-13 NOTE — ED Provider Notes (Signed)
[facility] EMERGENCY DEPARTMENT Provider Note   CSN: [id number] Arrival date & time: 07/13/17  1032     History   Chief Complaint Chief Complaint  Patient presents with  . Shortness of Breath    HPI [name] is a 84 y.o. female with a h/o of legal blindness, DM, CVA, COPD, CAD s/p IWMI with stent to RCA; repeat stent to RCA for instant restenosis; s/p DES to [facility] who presents to the emergency department by EMS with a chief complaint of dyspnea.  It has been constant since onset this morning; no aggravating or alleviating factors. She reports dyspnea over the last week, that significantly worsened when she awoke this morning. She reports associated an associated cold sweat and states "this is how my last heart attack presented- cold sweats, dyspnea, and generalized weakness".   She endorses generalized weakness that began after recently discharged from the hospital on 07/11/17 for COPD exacberation and acute hypoxic respiratory failure during which she was admitted and given bronchodilators, steroids, and empiric ABX. She was d/ced with a prednisone taper. She reports that she has not taken her Humalog since she was discharged because she states that she can't see the tip of her finger to see the reading on her glucometer and is afraid she will take too much insulin and get hypoglycemic.  She also reports polyuria that began this morning.   She denies fever, chest pain, back pain, leg swelling, weakness, numbness, syncope, wheezing, abdominal pain, N/VD, or headache.   Last cath 2011: 40% stenosis; 50% Lcx; 80% small OM1: RCA 20%) H/o of BOOP.   The history is provided by the patient. No language interpreter was used.    Past Medical History:  Diagnosis Date  . Adenomatous colon polyp   . Arthritis   . Blood transfusion   . Cancer [facility])    cervical cancer pre hysterectomy  . Cervical disc disease   . Chronic sinusitis   . CONSTIPATION, CHRONIC  06/26/2007   Qualifier: Diagnosis of  By: Nils Pyle CMA ([place]), Mearl Latin    . COPD (chronic obstructive pulmonary disease) ([place])   . Coronary artery disease   . CVA (cerebral infarction) 1991  . Depression   . Diabetes mellitus   . Diverticulosis   . 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 Dyslipidemia   . Emphysema   . Fibromyalgia   . GERD (gastroesophageal reflux disease)   . Hypertension   . Hypothyroidism   . IBS (irritable bowel syndrome)   . Legally blind   . Macular degeneration   . Myocardial infarction [facility]) may 27th 2007  . Pneumonia 07/16/2013    HX PNEUMONIA  . Pulmonary hypertension ([place])   . Raynaud's phenomenon   . Scleroderma ([place])   . Sjogren's disease ([place])   . Sleep apnea    STOP BANG SCORE 5  . Stroke [facility]) 1990   brain stem stroke - weakness rt hand  . Zenker's diverticulum     Patient Active Problem List   Diagnosis Date Noted  . Hypoxia 07/09/2017  . Dehydration   . Enteritis 11/17/2016  . Nondisplaced fracture of second metatarsal bone, left foot, subsequent encounter for fracture with routine healing 06/29/2016  . Arthralgia of both lower legs 03/30/2016  . Hypothyroidism 10/01/2015  . HCAP (healthcare-associated pneumonia) 05/16/2015  . Diarrhea 05/02/2015  . Diarrhea with dehydration 05/02/2015  . Hyponatremia 05/02/2015  . Weakness 01/29/2015  . Pain in joint, ankle and foot 11/20/2014  . Acute bronchitis  09/01/2014  . CAP (community acquired pneumonia) 05/26/2014  . Dysphagia, unspecified(787.20) 06/18/2013  . Stricture and stenosis of esophagus 06/18/2013  . Urinary incontinence in female 05/06/2012  . Fibromyalgia   . Chronic confusional state 03/21/2012  . Postoperative anemia 03/21/2012  . Anemia of chronic disease 03/19/2012  . Diabetes mellitus ([place]) 05/30/2011  . COPD (chronic obstructive pulmonary disease) ([place]) 05/30/2011  . Colonic stricture s/p lap sigmoid colectomy 03/18/2012 03/27/2011  . Diverticulosis of colon 03/27/2011  .  Systemic sclerosis ([place]) 06/29/2009  . Pure hypercholesterolemia 05/25/2009  . BOOP (bronchiolitis obliterans with organizing pneumonia) ([place]) 02/18/2009  . Chronic rhinitis 07/08/2007  . EMPHYSEMA 07/08/2007  . Hyperlipidemia 06/26/2007  . Depression 06/26/2007  . Legal blindness 06/26/2007  . Essential hypertension 06/26/2007  . Coronary atherosclerosis 06/26/2007  . ZENKER'S DIVERTICULUM 06/26/2007  . Irritable bowel syndrome 06/26/2007    Past Surgical History:  Procedure Laterality Date  . APPENDECTOMY    . BALLOON DILATION N/A 07/15/2015   Procedure: BALLOON DILATION;  Surgeon: Milus Banister, MD;  Location: [facility];  Service: Endoscopy;  Laterality: N/A;  . BILATERAL OOPHORECTOMY  2002  . BRONCHOSCOPY  02-22-09  . CARDIAC CATHETERIZATION     x 3  . CATARACT EXTRACTION    . CHOLECYSTECTOMY  1965  . COLON SURGERY  2014   colon resection  . colonectomy    . ESOPHAGOGASTRODUODENOSCOPY (EGD) WITH ESOPHAGEAL DILATION N/A 06/18/2013   Procedure: ESOPHAGOGASTRODUODENOSCOPY (EGD) WITH ESOPHAGEAL DILATION;  Surgeon: Inda Castle, MD;  Location: [place];  Service: Endoscopy;  Laterality: N/A;  . FLEXIBLE SIGMOIDOSCOPY N/A 07/15/2015   Procedure: FLEXIBLE SIGMOIDOSCOPY;  Surgeon: Milus Banister, MD;  Location: [facility];  Service: Endoscopy;  Laterality: N/A;  . lobe thyroid removal    . PROCTOSCOPY  03/18/2012   Procedure: PROCTOSCOPY;  Surgeon: Adin Hector, MD;  Location: [facility];  Service: General;  Laterality: N/A;  rigid procto  . RADIOLOGY WITH ANESTHESIA N/A 03/09/2015   Procedure: MRI CERVICAL SPINE WITHOUT AND LUMBER WITHOUT;  Surgeon: Medication Radiologist, MD;  Location: [place];  Service: Radiology;  Laterality: N/A;  . [place]   and implantation of a plate   . STENTED CARDIAC  ARTERY  2007 / 2007 / 2010   X 3 STENT  . THYROID LOBECTOMY  1991   removal of left lobe thyroid  . TOTAL ABDOMINAL HYSTERECTOMY  1973    OB History    No data  available       Home Medications    Prior to Admission medications   Medication Sig Start Date End Date Taking? Authorizing Provider  albuterol (PROVENTIL HFA;VENTOLIN HFA) 108 (90 Base) MCG/ACT inhaler Inhale 2 puffs into the lungs every 6 (six) hours as needed for wheezing or shortness of breath. 07/11/17   Ghimire, Henreitta Leber, MD  aspirin EC 81 MG tablet Take 1 tablet (81 mg total) by mouth daily. 11/18/13   Fay Records, MD  budesonide-formoterol [facility]) 160-4.5 MCG/ACT inhaler Inhale 2 puffs into the lungs 2 (two) times daily. 07/11/17   Ghimire, Henreitta Leber, MD  cetirizine (ZYRTEC) 10 MG tablet Take 10 mg by mouth daily after breakfast.     [provider]  esomeprazole (NEXIUM) 40 MG capsule Take 40 mg by mouth daily.     [provider]  fluticasone (FLONASE) 50 MCG/ACT nasal spray Place 1 spray into both nostrils daily. 07/11/17   Ghimire, Henreitta Leber, MD  guaiFENesin (MUCINEX) 600 MG 12 hr tablet  Take 600 mg by mouth daily as needed for to loosen phlegm.     [provider]  imipramine (TOFRANIL) 25 MG tablet Take 75 mg by mouth at bedtime.  05/26/09   [provider]  insulin lispro (HUMALOG KWIKPEN) 100 UNIT/ML KiwkPen Inject 5-6 Units into the skin 3 (three) times daily with meals. Inject 6 units subcutaneously with breakfast and lunch, inject 5 units with supper    [provider]  levothyroxine (SYNTHROID, LEVOTHROID) 100 MCG tablet Take 100 mcg by mouth daily before breakfast.    [provider]  montelukast (SINGULAIR) 10 MG tablet Take 10 mg by mouth daily with breakfast. 05/10/15   [provider]  oxymetazoline (AFRIN) 0.05 % nasal spray Place 2 sprays into both nostrils 2 (two) times daily for 6 doses. Stop after 6 doses 07/11/17 07/14/17  Ghimire, Henreitta Leber, MD  predniSONE (DELTASONE) 5 MG tablet Take 40 mg by mouth daily, for 2 days, then, Take 30 mg by mouth daily, for 2 days, then, Take 20 mg by mouth daily, for 2  days, then, Take 10 mg by mouth daily, for 2 days, then, Resume usual dosing of 5 mg daily. 07/11/17   Ghimire, Henreitta Leber, MD  triazolam (HALCION) 0.25 MG tablet Take 0.25 mg by mouth at bedtime as needed for sleep. For sleep    [provider]    Family History Family History  Problem Relation Age of Onset  . Other Mother 6       Died from sepsis  . Stroke Father 67       died  . Heart disease Father   . Diabetes Father   . Cancer Sister        lung  . Diabetes Sister   . Cancer Brother        lung  . Diabetes Brother   . Diabetes Brother     Social History Social History   Tobacco Use  . Smoking status: Former Smoker    Packs/day: 1.00    Years: 40.00    Pack years: 40.00    Types: Cigarettes    Last attempt to quit: 06/26/1988    Years since quitting: 29.0  . Smokeless tobacco: Never Used  . Tobacco comment: 40 pack years  Substance Use Topics  . Alcohol use: No  . Drug use: No     Allergies   Sulfa antibiotics; Lantus [insulin glargine]; Levaquin [levofloxacin in d5w]; Maxidex [dexamethasone]; Metoprolol-hydrochlorothiazide; Norvasc [amlodipine]; Novolog [insulin aspart]; Peanut oil; Penciclovir; Senna [sennosides]; Sulfonamide derivatives; Titanium; Yellow dye; and Adhesive [tape]   Review of Systems Review of Systems  Constitutional: Positive for diaphoresis. Negative for activity change and chills.  HENT: Negative for congestion.   Eyes: Negative for visual disturbance.  Respiratory: Positive for shortness of breath.   Cardiovascular: Negative for chest pain.  Gastrointestinal: Negative for abdominal pain, diarrhea, nausea and vomiting.  Endocrine: Positive for polyuria.  Genitourinary: Negative for dysuria.  Musculoskeletal: Negative for back pain, neck pain and neck stiffness.  Skin: Negative for rash.  Allergic/Immunologic: Positive for immunocompromised state.  Neurological: Negative for dizziness, syncope, weakness, light-headedness,  numbness and headaches.   Physical Exam Updated Vital Signs BP (!) 159/65   Pulse 70   Resp 12   SpO2 97%   Physical Exam  Constitutional: No distress.  No acute distress.  HENT:  Head: Normocephalic.  Right Ear: External ear normal.  Left Ear: External ear normal.  Eyes: Conjunctivae are normal.  Neck: Neck  supple.  Cardiovascular: Normal rate, regular rhythm, normal heart sounds and intact distal pulses. Exam reveals no gallop and no friction rub.  No murmur heard. Pulmonary/Chest: Effort normal. No respiratory distress. She exhibits no tenderness.  Coarse breath sounds noted in the right mid-lobe with inspiration and expiration.  Abdominal: Soft. She exhibits no distension and no mass. There is no tenderness. There is no rebound and no guarding. No hernia.  Musculoskeletal: She exhibits edema.  1+ pitting edema noted to the bilateral lower extremities.  Neurological: She is alert.  Skin: Skin is warm. Capillary refill takes less than 2 seconds. No rash noted. She is not diaphoretic.  Psychiatric: Her behavior is normal.  Nursing note and vitals reviewed.    ED Treatments / Results  Labs (all labs ordered are listed, but only abnormal results are displayed) Labs Reviewed  CBC WITH DIFFERENTIAL/PLATELET - Abnormal; Notable for the following components:      Result Value   WBC 14.4 (*)    Neutro Abs 10.2 (*)    All other components within normal limits  COMPREHENSIVE METABOLIC PANEL - Abnormal; Notable for the following components:   Chloride 98 (*)    Glucose, Bld 184 (*)    Calcium 8.7 (*)    Albumin 3.3 (*)    All other components within normal limits  BRAIN NATRIURETIC PEPTIDE - Abnormal; Notable for the following components:   B Natriuretic Peptide 214.7 (*)    All other components within normal limits  I-STAT TROPONIN, ED - Abnormal; Notable for the following components:   Troponin i, poc 0.18 (*)    All other components within normal limits  URINALYSIS,  ROUTINE W REFLEX MICROSCOPIC  HEPARIN LEVEL (UNFRACTIONATED)  I-STAT TROPONIN, ED  CBG MONITORING, ED    EKG  EKG Interpretation  Date/Time:  Friday July 13 2017 11:50:33 EST Ventricular Rate:  79 PR Interval:    QRS Duration: 77 QT Interval:  374 QTC Calculation: 429 R Axis:   56 Text Interpretation:  Sinus rhythm Nonspecific T abnormalities, lateral leads Confirmed by Dene Gentry [phone]) on 07/13/2017 11:53:52 AM Also confirmed by Dene Gentry [phone]), editor [place], Jeannetta Nap [phone])  on 07/13/2017 12:38:03 PM       Radiology Dg Chest 2 View  Result Date: 07/13/2017 CLINICAL DATA:  Shortness of breath. EXAM: CHEST  2 VIEW COMPARISON:  Chest x-ray and CT chest dated July 09, 2017. FINDINGS: The heart size and mediastinal contours are within normal limits. Normal pulmonary vascularity. Slightly coarsened interstitial markings are similar to prior study. No focal consolidation, pleural effusion, or pneumothorax. No acute osseous abnormality. IMPRESSION: No active cardiopulmonary disease. Electronically Signed   By: Titus Dubin M.D.   On: 07/13/2017 12:41    Procedures Procedures (including critical care time)  Medications Ordered in ED Medications  heparin ADULT infusion 100 units/mL (25000 units/249mL sodium chloride 0.45%) (750 Units/hr Intravenous New Bag/Given 07/13/17 1621)  furosemide (LASIX) injection 20 mg (20 mg Intravenous Given 07/13/17 1544)  sodium chloride 0.9 % bolus 500 mL (500 mLs Intravenous New Bag/Given 07/13/17 1543)  aspirin chewable tablet 324 mg (324 mg Oral Given 07/13/17 1444)  heparin bolus via infusion 3,500 Units (3,500 Units Intravenous Bolus from Bag 07/13/17 1621)     Initial Impression / Assessment and Plan / ED Course  I have reviewed the triage vital signs and the nursing notes.  Pertinent labs & imaging results that were available during my care of the patient were reviewed by me and considered in my  medical decision making (see  chart for details).     84 year old female with a h/o of legal blindness, DM, CVA, COPD, CAD s/p IWMI with stent to RCA; repeat stent to RCA for instant restenosis; s/p DES to [facility] who presents with diaphoresis, dyspnea, and generalized weakness.  The patient was discussed with Dr. Francia Greaves, attending physician.  She states that her last and STEMI presented exactly like her symptoms today.  Initial troponin is negative; repeat troponin 0.18.  No acute changes on EKG or repeat EKG.  On physical exam, she has 1+ mild pitting edema; BNP 214.7.  Leukocytosis of 14.4, improved from 22.7 at discharged on 01/16. 500 cc IVF bolus and 20 mg of Lasix given. Heparin initiated per pharmacy. Ddx includes NSTEMI, demand ischemia, PE, or acute respiratory failure.  The patient has no risk factors or history of PE or DVT. Doubt acute respiratory failure as the patient's SaO2 is 97%.  Consulted cardiology for admission, which is pending.  Discussed the patient with Dr. Noemi Chapel who will follow the patient if the disposition changes.  The plan has been discussed with the patient and her daughter who are in agreement with the workup.  Final Clinical Impressions(s) / ED Diagnoses   Final diagnoses:  None    ED Discharge Orders    None       Joanne Gavel, PA-C 07/13/17 1637    Valarie Merino, MD 07/14/17 1353

## 2017-07-13 NOTE — ED Notes (Signed)
Patient transported to X-ray 

## 2017-07-13 NOTE — ED Notes (Signed)
EMERGENCY DEPARTMENT  US GUIDANCE EXAM Emergency Ultrasound:  US Guidance for Needle Guidance  INDICATIONS: Difficult vascular access Linear probe used in real-time to visualize location of needle entry through skin.   PERFORMED BY: Myself IMAGES ARCHIVED?: No LIMITATIONS: None VIEWS USED: Transverse INTERPRETATION: Needle visualized within vein   Tolerated well w/no complications.   Aaron Edelman RN  3:24 PM 07/13/17

## 2017-07-13 NOTE — ED Triage Notes (Signed)
Pt in from home via [facility], per report pt c/o SOB & diaphoresis with recent tx for pneumonia, hx of NSTEMI, c/o generalized weakness, NSR in route, 99% on RA, speaks in 138/63, RR 16, out of Humolog since discharge from hospital on Weds, pt c/o polyuria, CBG 235, A&O x4

## 2017-07-13 NOTE — ED Notes (Signed)
Attempted to insert 22 gauge IV in L forearm. Patient had stated that IV placed in L forearm yesterday at [facility]. Patient became angry, yelled, and said "stop! That hurts! I don't want that." Patient pulled arm away. States she does not want IV. Will notify PCP.

## 2017-07-14 DIAGNOSIS — I25110 Atherosclerotic heart disease of native coronary artery with unstable angina pectoris: Secondary | ICD-10-CM

## 2017-07-14 DIAGNOSIS — R0602 Shortness of breath: Secondary | ICD-10-CM

## 2017-07-14 DIAGNOSIS — I10 Essential (primary) hypertension: Secondary | ICD-10-CM

## 2017-07-14 LAB — COMPREHENSIVE METABOLIC PANEL
ALT: 17 U/L (ref 14–54)
AST: 17 U/L (ref 15–41)
Albumin: 3.1 g/dL — ABNORMAL LOW (ref 3.5–5.0)
Alkaline Phosphatase: 62 U/L (ref 38–126)
Anion gap: 14 (ref 5–15)
BUN: 21 mg/dL — ABNORMAL HIGH (ref 6–20)
CO2: 25 mmol/L (ref 22–32)
Calcium: 8.4 mg/dL — ABNORMAL LOW (ref 8.9–10.3)
Chloride: 93 mmol/L — ABNORMAL LOW (ref 101–111)
Creatinine, Ser: 0.93 mg/dL (ref 0.44–1.00)
GFR calc Af Amer: >60 mL/min (ref >60)
GFR calc non Af Amer: 55 mL/min — ABNORMAL LOW (ref >60)
Glucose, Bld: 200 mg/dL — ABNORMAL HIGH (ref 65–99)
Potassium: 3.6 mmol/L (ref 3.5–5.1)
Sodium: 132 mmol/L — ABNORMAL LOW (ref 135–145)
Total Bilirubin: 0.8 mg/dL (ref 0.3–1.2)
Total Protein: 5.8 g/dL — ABNORMAL LOW (ref 6.5–8.1)

## 2017-07-14 LAB — TROPONIN I
Troponin I: 0.13 ng/mL (ref <0.03)
Troponin I: 0.12 ng/mL (ref <0.03)

## 2017-07-14 LAB — LIPID PANEL
Cholesterol: 226 mg/dL — ABNORMAL HIGH (ref 0–200)
HDL: 55 mg/dL (ref >40)
LDL Cholesterol: 140 mg/dL — ABNORMAL HIGH (ref 0–99)
Total CHOL/HDL Ratio: 4.1 RATIO
Triglycerides: 154 mg/dL — ABNORMAL HIGH (ref <150)
VLDL: 31 mg/dL (ref 0–40)

## 2017-07-14 LAB — GLUCOSE, CAPILLARY
Glucose-Capillary: 132 mg/dL — ABNORMAL HIGH (ref 65–99)
Glucose-Capillary: 238 mg/dL — ABNORMAL HIGH (ref 65–99)
Glucose-Capillary: 212 mg/dL — ABNORMAL HIGH (ref 65–99)
Glucose-Capillary: 161 mg/dL — ABNORMAL HIGH (ref 65–99)

## 2017-07-14 LAB — CBC
HCT: 40.8 % (ref 36.0–46.0)
Hemoglobin: 13.3 g/dL (ref 12.0–15.0)
MCH: 28.2 pg (ref 26.0–34.0)
MCHC: 32.6 g/dL (ref 30.0–36.0)
MCV: 86.6 fL (ref 78.0–100.0)
Platelets: 218 10*3/uL (ref 150–400)
RBC: 4.71 MIL/uL (ref 3.87–5.11)
RDW: 13.5 % (ref 11.5–15.5)
WBC: 9.6 10*3/uL (ref 4.0–10.5)

## 2017-07-14 LAB — HEPARIN LEVEL (UNFRACTIONATED)
Heparin Unfractionated: 0.26 IU/mL — ABNORMAL LOW (ref 0.30–0.70)
Heparin Unfractionated: 0.39 IU/mL (ref 0.30–0.70)

## 2017-07-14 MED ORDER — INSULIN LISPRO 100 UNIT/ML (KWIKPEN): 0.0000 [IU] | PEN_INJECTOR | Freq: Every day | SUBCUTANEOUS | Status: DC

## 2017-07-14 MED ORDER — ISOSORBIDE DINITRATE 10 MG PO TABS
10.0000 mg | ORAL_TABLET | Freq: Two times a day (BID) | ORAL | Status: DC
  Administered 2017-07-14 – 2017-07-16 (×5): 10 mg via ORAL
  Filled 2017-07-14 (×5): qty 1

## 2017-07-14 MED ORDER — INSULIN LISPRO 100 UNIT/ML (KWIKPEN)
0.0000 [IU] | PEN_INJECTOR | Freq: Three times a day (TID) | SUBCUTANEOUS | Status: DC
  Administered 2017-07-14: 5 [IU] via SUBCUTANEOUS
  Administered 2017-07-14: 2 [IU] via SUBCUTANEOUS
  Administered 2017-07-14: 5 [IU] via SUBCUTANEOUS
  Administered 2017-07-15: 10 [IU] via SUBCUTANEOUS
  Administered 2017-07-15: 3 [IU] via SUBCUTANEOUS
  Administered 2017-07-15: 5 [IU] via SUBCUTANEOUS
  Administered 2017-07-16: 8 [IU] via SUBCUTANEOUS
  Administered 2017-07-16: 3 [IU] via SUBCUTANEOUS

## 2017-07-14 MED ORDER — FUROSEMIDE 10 MG/ML IJ SOLN
20.0000 mg | Freq: Once | INTRAMUSCULAR | Status: AC
  Administered 2017-07-14: 20 mg via INTRAVENOUS
  Filled 2017-07-14: qty 2

## 2017-07-14 MED ORDER — INSULIN LISPRO 100 UNIT/ML (KWIKPEN)
5.0000 [IU] | PEN_INJECTOR | Freq: Three times a day (TID) | SUBCUTANEOUS | Status: DC
  Administered 2017-07-14 – 2017-07-16 (×8): 5 [IU] via SUBCUTANEOUS
  Filled 2017-07-14: qty 3

## 2017-07-14 NOTE — Progress Notes (Signed)
Pt refusing bed alarm. Pt states she lives home alone and has never fallen. Pt educated on high fall risk due to IV infusion and pt reporting difficulty seeing. Pt still refuses. Will continue to monitor

## 2017-07-14 NOTE — Progress Notes (Signed)
Progress Note   Subjective   Doing well today, the patient denies CP.  She is pleased that her SOB is better.  No new concerns  Inpatient Medications    Scheduled Meds: . aspirin EC  81 mg Oral Daily  . fluticasone  1 spray Each Nare Daily  . imipramine  75 mg Oral QHS  . Influenza vac split quadrivalent PF  0.5 mL Intramuscular Tomorrow-1000  . insulin lispro  0-15 Units Subcutaneous TID WC  . insulin lispro  0-5 Units Subcutaneous QHS  . insulin lispro  5 Units Subcutaneous TID WC  . levothyroxine  100 mcg Oral QAC breakfast  . loratadine  10 mg Oral Daily  . montelukast  10 mg Oral QHS  . pantoprazole  40 mg Oral Daily  . sodium chloride flush  3 mL Intravenous Q12H   Continuous Infusions: . sodium chloride    . heparin 850 Units/hr (07/14/17 0237)   PRN Meds: sodium chloride, acetaminophen, albuterol, ALPRAZolam, guaiFENesin, HYDROcodone-acetaminophen, nitroGLYCERIN, ondansetron (ZOFRAN) IV, sodium chloride flush, triazolam   Vital Signs    Vitals:   07/13/17 2326 07/14/17 0502 07/14/17 0736 07/14/17 1242  BP: (!) 148/60 (!) 159/57 (!) 157/70 (!) 152/56  Pulse: 90 72 74 94  Resp: 16 16 16 16   Temp: 97.9 F (36.6 C) 98.3 F (36.8 C) 98.2 F (36.8 C) 98.2 F (36.8 C)  TempSrc: Oral Oral Oral Oral  SpO2: 93% 98% 98% 95%  Weight: 129 lb 12.8 oz (58.9 kg) 128 lb 9.6 oz (58.3 kg)    Height: 5\' 2"  (1.575 m)       Intake/Output Summary (Last 24 hours) at 07/14/2017 1343 Last data filed at 07/14/2017 1254 Gross per 24 hour  Intake 675.5 ml  Output 800 ml  Net -124.5 ml   Filed Weights   07/13/17 2326 07/14/17 0502  Weight: 129 lb 12.8 oz (58.9 kg) 128 lb 9.6 oz (58.3 kg)    Telemetry    Sinus rhythm - Personally Reviewed  Physical Exam   GEN- The patient is elderly and frail appearing, alert and oriented x 3 today.   Head- normocephalic, atraumatic Eyes-  Sclera clear, conjunctiva pink Ears- hearing intact Oropharynx- clear Neck- supple, Lungs-  prolonged expiratory phase, normal work of breathing Heart- Regular rate and rhythm  GI- soft, NT, ND, + BS Extremities- no clubbing, cyanosis, or edema  MS- diffuse atrophy Skin- no rash or lesion Psych- euthymic mood, full affect Neuro- strength and sensation are intact   Labs    Chemistry Recent Labs  Lab 07/11/17 0916 07/13/17 1047 07/14/17 0118  NA 130* 138 132*  K 4.4 3.6 3.6  CL 98* 98* 93*  CO2 24 27 25   GLUCOSE 275* 184* 200*  BUN 20 20 21*  CREATININE 0.81 0.70 0.93  CALCIUM 8.5* 8.7* 8.4*  PROT  --  6.5 5.8*  ALBUMIN  --  3.3* 3.1*  AST  --  21 17  ALT  --  20 17  ALKPHOS  --  69 62  BILITOT  --  0.6 0.8  GFRNONAA >60 >60 55*  GFRAA >60 >60 >60  ANIONGAP 8 13 14      Hematology Recent Labs  Lab 07/11/17 0916 07/13/17 1047 07/14/17 0118  WBC 22.7* 14.4* 9.6  RBC 4.11 5.05 4.71  HGB 11.7* 14.5 13.3  HCT 35.8* 44.0 40.8  MCV 87.1 87.1 86.6  MCH 28.5 28.7 28.2  MCHC 32.7 33.0 32.6  RDW 13.4 13.8 13.5  PLT 273  251 218    Cardiac Enzymes Recent Labs  Lab 07/10/17 0843 07/13/17 1953 07/14/17 0118 07/14/17 0842  TROPONINI <0.03 0.15* 0.13* 0.12*    Recent Labs  Lab 07/09/17 1436 07/13/17 1119 07/13/17 1420  TROPIPOC 0.00 0.07 0.18*      Echo 07/10/17 reviewed  Assessment & Plan    1.  SOB/ prior CAD multifactorial Improved today with gentle diuresis.  Give another dose IV lasix Dr Harrington Challenger is worried about CAD as the cause.  Could consider cath.  Continue IV heparin for now. Add nitrates (below)  2. HTN Elevated Prior intolerances noted Will try isosorbide dinitrate and titrate as BP allows No change required today  3. BOOP On steroids  Thompson Grayer MD, [facility] 07/14/2017 1:43 PM

## 2017-07-14 NOTE — Progress Notes (Signed)
ANTICOAGULATION CONSULT NOTE - Follow Up Consult  Pharmacy Consult for Heparin  Indication: chest pain/ACS  Patient Measurements: Height: 5\' 2"  (157.5 cm) Weight: 129 lb 12.8 oz (58.9 kg) IBW/kg (Calculated) : 50.1 Vital Signs: Temp: 97.9 F (36.6 C) (01/18 2326) Temp Source: Oral (01/18 2326) BP: 148/60 (01/18 2326) Pulse Rate: 90 (01/18 2326)  Labs: Recent Labs    07/11/17 0916 07/13/17 1047 07/13/17 1953 07/14/17 0118  HGB 11.7* 14.5  --  13.3  HCT 35.8* 44.0  --  40.8  PLT 273 251  --  218  HEPARINUNFRC  --   --   --  0.26*  CREATININE 0.81 0.70  --   --   TROPONINI  --   --  0.15*  --     Estimated Creatinine Clearance: 41.4 mL/min (by C-G formula based on SCr of 0.7 mg/dL).  Assessment: 84 y/o F on heparin for CP, troponin is mildly elevated, undergoing CP work-up, initial heparin level is just below goal, no issues per RN.  Goal of Therapy:  Heparin level 0.3-0.7 units/ml Monitor platelets by anticoagulation protocol: Yes   Plan:  Inc heparin to 850 units/hr 1100 HL  Jatniel Verastegui 07/14/2017,2:32 AM

## 2017-07-14 NOTE — Progress Notes (Signed)
ANTICOAGULATION CONSULT NOTE - Follow Up Consult  Pharmacy Consult for Heparin  Indication: chest pain/ACS  Patient Measurements: Height: 5\' 2"  (157.5 cm) Weight: 128 lb 9.6 oz (58.3 kg) IBW/kg (Calculated) : 50.1 Vital Signs: Temp: 98.2 F (36.8 C) (01/19 0736) Temp Source: Oral (01/19 0736) BP: 157/70 (01/19 0736) Pulse Rate: 74 (01/19 0736)  Labs: Recent Labs    07/13/17 1047 07/13/17 1953 07/14/17 0118 07/14/17 0842  HGB 14.5  --  13.3  --   HCT 44.0  --  40.8  --   PLT 251  --  218  --   HEPARINUNFRC  --   --  0.26*  --   CREATININE 0.70  --  0.93  --   TROPONINI  --  0.15* 0.13* 0.12*    Estimated Creatinine Clearance: 35.6 mL/min (by C-G formula based on SCr of 0.93 mg/dL).  Assessment: 84 y/o F on heparin for CP, troponin is mildly elevated, undergoing CP work-up. Heparin level therpeutic. CBC stable, no bleed documented. Not on anticoagulation PTA.  Goal of Therapy:  Heparin level 0.3-0.7 units/ml Monitor platelets by anticoagulation protocol: Yes   Plan:  Continue heparin at 850 units/hr Daily heparin level/CBC Monitor s/sx bleeding F/u Cardiology plans   Elicia Lamp, PharmD, BCPS Clinical Pharmacist Clinical phone for 07/14/2017 until 3:30pm: [phone] If after 3:30pm, please call main pharmacy at: [phone] 07/14/2017 11:44 AM

## 2017-07-15 ENCOUNTER — Encounter (HOSPITAL_COMMUNITY): Payer: Self-pay

## 2017-07-15 DIAGNOSIS — J8489 Other specified interstitial pulmonary diseases: Principal | ICD-10-CM

## 2017-07-15 LAB — CULTURE, BLOOD (ROUTINE X 2)
Culture: NO GROWTH
Culture: NO GROWTH
Special Requests: ADEQUATE
Special Requests: ADEQUATE

## 2017-07-15 LAB — BASIC METABOLIC PANEL
Anion gap: 13 (ref 5–15)
BUN: 20 mg/dL (ref 6–20)
CO2: 26 mmol/L (ref 22–32)
Calcium: 8.7 mg/dL — ABNORMAL LOW (ref 8.9–10.3)
Chloride: 94 mmol/L — ABNORMAL LOW (ref 101–111)
Creatinine, Ser: 0.9 mg/dL (ref 0.44–1.00)
GFR calc Af Amer: >60 mL/min (ref >60)
GFR calc non Af Amer: 57 mL/min — ABNORMAL LOW (ref >60)
Glucose, Bld: 234 mg/dL — ABNORMAL HIGH (ref 65–99)
Potassium: 3.8 mmol/L (ref 3.5–5.1)
Sodium: 133 mmol/L — ABNORMAL LOW (ref 135–145)

## 2017-07-15 LAB — CBC
HCT: 40.3 % (ref 36.0–46.0)
Hemoglobin: 13.1 g/dL (ref 12.0–15.0)
MCH: 28.5 pg (ref 26.0–34.0)
MCHC: 32.5 g/dL (ref 30.0–36.0)
MCV: 87.8 fL (ref 78.0–100.0)
Platelets: 222 10*3/uL (ref 150–400)
RBC: 4.59 MIL/uL (ref 3.87–5.11)
RDW: 13.8 % (ref 11.5–15.5)
WBC: 11.3 10*3/uL — ABNORMAL HIGH (ref 4.0–10.5)

## 2017-07-15 LAB — GLUCOSE, CAPILLARY
Glucose-Capillary: 225 mg/dL — ABNORMAL HIGH (ref 65–99)
Glucose-Capillary: 248 mg/dL — ABNORMAL HIGH (ref 65–99)
Glucose-Capillary: 156 mg/dL — ABNORMAL HIGH (ref 65–99)
Glucose-Capillary: 163 mg/dL — ABNORMAL HIGH (ref 65–99)

## 2017-07-15 LAB — HEPARIN LEVEL (UNFRACTIONATED): Heparin Unfractionated: 0.16 IU/mL — ABNORMAL LOW (ref 0.30–0.70)

## 2017-07-15 MED ORDER — FUROSEMIDE 10 MG/ML IJ SOLN
20.0000 mg | Freq: Once | INTRAMUSCULAR | Status: AC
  Administered 2017-07-15: 20 mg via INTRAVENOUS
  Filled 2017-07-15: qty 2

## 2017-07-15 NOTE — Plan of Care (Signed)
  Progressing Education: Knowledge of General Education information will improve 07/15/2017 0224 - Progressing by Theador Hawthorne, RN Nutrition: Adequate nutrition will be maintained 07/15/2017 0224 - Progressing by Theador Hawthorne, RN Safety: Ability to remain free from injury will improve 07/15/2017 0224 - Progressing by Theador Hawthorne, RN

## 2017-07-15 NOTE — Progress Notes (Signed)
Progress Note   Subjective   Doing well today, the patient denies CP.  SOB has returned to baseline.  No new concerns  Inpatient Medications    Scheduled Meds: . aspirin EC  81 mg Oral Daily  . fluticasone  1 spray Each Nare Daily  . furosemide  20 mg Intravenous Once  . imipramine  75 mg Oral QHS  . insulin lispro  0-15 Units Subcutaneous TID WC  . insulin lispro  0-5 Units Subcutaneous QHS  . insulin lispro  5 Units Subcutaneous TID WC  . isosorbide dinitrate  10 mg Oral BID  . levothyroxine  100 mcg Oral QAC breakfast  . loratadine  10 mg Oral Daily  . montelukast  10 mg Oral QHS  . pantoprazole  40 mg Oral Daily  . sodium chloride flush  3 mL Intravenous Q12H   Continuous Infusions: . sodium chloride     PRN Meds: sodium chloride, acetaminophen, albuterol, ALPRAZolam, guaiFENesin, HYDROcodone-acetaminophen, nitroGLYCERIN, ondansetron (ZOFRAN) IV, sodium chloride flush, triazolam   Vital Signs    Vitals:   07/14/17 0736 07/14/17 1242 07/14/17 2045 07/15/17 0517  BP: (!) 157/70 (!) 152/56 (!) 111/48 (!) 141/62  Pulse: 74 94 91 95  Resp: 16 16 16 16   Temp: 98.2 F (36.8 C) 98.2 F (36.8 C) 98.4 F (36.9 C) 98 F (36.7 C)  TempSrc: Oral Oral Oral Oral  SpO2: 98% 95% 98% 97%  Weight:    130 lb 3.2 oz (59.1 kg)  Height:        Intake/Output Summary (Last 24 hours) at 07/15/2017 1136 Last data filed at 07/15/2017 0524 Gross per 24 hour  Intake 1092.26 ml  Output 450 ml  Net 642.26 ml   Filed Weights   07/13/17 2326 07/14/17 0502 07/15/17 0517  Weight: 129 lb 12.8 oz (58.9 kg) 128 lb 9.6 oz (58.3 kg) 130 lb 3.2 oz (59.1 kg)    Telemetry    sinus - Personally Reviewed  Physical Exam   GEN- The patient is elderly and frail appearing, alert and oriented x 3 today.   Head- normocephalic, atraumatic Eyes-  Sclera clear, conjunctiva pink Ears- hearing intact Oropharynx- clear Neck- supple, + JVD Lungs- few basilar rales, normal work of breathing Heart-  Regular rate and rhythm  GI- soft, NT, ND, + BS Extremities- no clubbing, cyanosis, or edema  MS- no significant deformity or atrophy Skin- no rash or lesion Psych- euthymic mood, full affect Neuro- strength and sensation are intact   Labs    Chemistry Recent Labs  Lab 07/13/17 1047 07/14/17 0118 07/15/17 0733  NA 138 132* 133*  K 3.6 3.6 3.8  CL 98* 93* 94*  CO2 27 25 26   GLUCOSE 184* 200* 234*  BUN 20 21* 20  CREATININE 0.70 0.93 0.90  CALCIUM 8.7* 8.4* 8.7*  PROT 6.5 5.8*  --   ALBUMIN 3.3* 3.1*  --   AST 21 17  --   ALT 20 17  --   ALKPHOS 69 62  --   BILITOT 0.6 0.8  --   GFRNONAA >60 55* 57*  GFRAA >60 >60 >60  ANIONGAP 13 14 13      Hematology Recent Labs  Lab 07/13/17 1047 07/14/17 0118 07/15/17 0733  WBC 14.4* 9.6 11.3*  RBC 5.05 4.71 4.59  HGB 14.5 13.3 13.1  HCT 44.0 40.8 40.3  MCV 87.1 86.6 87.8  MCH 28.7 28.2 28.5  MCHC 33.0 32.6 32.5  RDW 13.8 13.5 13.8  PLT 251 218  222    Cardiac Enzymes Recent Labs  Lab 07/10/17 0843 07/13/17 1953 07/14/17 0118 07/14/17 0842  TROPONINI <0.03 0.15* 0.13* 0.12*    Recent Labs  Lab 07/09/17 1436 07/13/17 1119 07/13/17 1420  TROPIPOC 0.00 0.07 0.18*        Assessment & Plan    1.  SOB multifactoral Improved with gentle diuresis Will give Lasix 20mg  IV x 1 today  2. CAD isosorbide added yesterday As she has clinically improved with medical therapy, I think that we could avoid cath.  Will plan medical management  3. HTN Improving with isosorbide added yesterday  4. BOOP  On steroids  Ambulate today PT Hopefully home in am  Thompson Grayer MD, [facility] 07/15/2017 11:36 AM

## 2017-07-15 NOTE — Progress Notes (Signed)
Pt is stable, ambulated in a hallway, Heparin IV DC this afternoon, diuresing well, vitals stable, no any sign of distress and complain of pain, will continue to monitor the patient  Palma Holter, RN

## 2017-07-15 NOTE — Progress Notes (Addendum)
ANTICOAGULATION CONSULT NOTE - Follow Up Consult  Pharmacy Consult for Heparin  Indication: chest pain/ACS  Patient Measurements: Height: 5\' 2"  (157.5 cm) Weight: 130 lb 3.2 oz (59.1 kg) IBW/kg (Calculated) : 50.1 Vital Signs: Temp: 98 F (36.7 C) (01/20 0517) Temp Source: Oral (01/20 0517) BP: 141/62 (01/20 0517) Pulse Rate: 95 (01/20 0517)  Labs: Recent Labs    07/13/17 1047 07/13/17 1953 07/14/17 0118 07/14/17 0842 07/14/17 1122 07/15/17 0733  HGB 14.5  --  13.3  --   --  13.1  HCT 44.0  --  40.8  --   --  40.3  PLT 251  --  218  --   --  222  HEPARINUNFRC  --   --  0.26*  --  0.39 0.16*  CREATININE 0.70  --  0.93  --   --   --   TROPONINI  --  0.15* 0.13* 0.12*  --   --     Estimated Creatinine Clearance: 35.6 mL/min (by C-G formula based on SCr of 0.93 mg/dL).  Assessment: 84 y/o F on heparin for CP, troponin is mildly elevated, undergoing CP work-up. Heparin level decreased, now subtherapeutic at 0.16. CBC stable, no bleed or issues with the drip per RN. Not on anticoagulation PTA. Cardiology may consider cath per notes.  Goal of Therapy:  Heparin level 0.3-0.7 units/ml Monitor platelets by anticoagulation protocol: Yes   Plan:  Increase heparin to 1050 units/hr 8h heparin level Daily heparin level/CBC Monitor s/sx bleeding F/u Cardiology plans   Elicia Lamp, PharmD, BCPS Clinical Pharmacist Clinical phone for 07/15/2017 until 3:30pm: [phone] If after 3:30pm, please call main pharmacy at: [phone] 07/15/2017 10:31 AM

## 2017-07-16 ENCOUNTER — Encounter (HOSPITAL_COMMUNITY): Payer: Self-pay | Admitting: General Practice

## 2017-07-16 ENCOUNTER — Inpatient Hospital Stay: Payer: Medicare Other | Admitting: Acute Care

## 2017-07-16 DIAGNOSIS — R079 Chest pain, unspecified: Secondary | ICD-10-CM

## 2017-07-16 DIAGNOSIS — R0602 Shortness of breath: Secondary | ICD-10-CM | POA: Diagnosis present

## 2017-07-16 LAB — BASIC METABOLIC PANEL
Anion gap: 10 (ref 5–15)
BUN: 18 mg/dL (ref 6–20)
CO2: 26 mmol/L (ref 22–32)
Calcium: 8.7 mg/dL — ABNORMAL LOW (ref 8.9–10.3)
Chloride: 98 mmol/L — ABNORMAL LOW (ref 101–111)
Creatinine, Ser: 0.9 mg/dL (ref 0.44–1.00)
GFR calc Af Amer: >60 mL/min (ref >60)
GFR calc non Af Amer: 57 mL/min — ABNORMAL LOW (ref >60)
Glucose, Bld: 223 mg/dL — ABNORMAL HIGH (ref 65–99)
Potassium: 3.8 mmol/L (ref 3.5–5.1)
Sodium: 134 mmol/L — ABNORMAL LOW (ref 135–145)

## 2017-07-16 LAB — GLUCOSE, CAPILLARY
Glucose-Capillary: 199 mg/dL — ABNORMAL HIGH (ref 65–99)
Glucose-Capillary: 262 mg/dL — ABNORMAL HIGH (ref 65–99)

## 2017-07-16 MED ORDER — ISOSORBIDE DINITRATE 10 MG PO TABS
10.0000 mg | ORAL_TABLET | Freq: Two times a day (BID) | ORAL | 6 refills | Status: DC
Start: 2017-07-16 — End: 2017-07-19

## 2017-07-16 NOTE — Discharge Summary (Signed)
Discharge Summary    Patient ID: [name],  MRN: [id number], DOB/AGE: [birth year] 84 y.o.  Admit date: 07/13/2017 Discharge date: 07/16/2017  Primary Care Provider: No primary care provider on file. Primary Cardiologist: Dorris Carnes, MD  Discharge Diagnoses    Principal Problem:   SOB (shortness of breath), secondary to Boop Active Problems:   Essential hypertension   Coronary atherosclerosis   BOOP (bronchiolitis obliterans with organizing pneumonia) ([place])   COPD (chronic obstructive pulmonary disease) ([facility])   Chest pain, moderate coronary artery risk   Allergies Allergies  Allergen Reactions  . Sulfa Antibiotics     Other reaction(s): Other (See Comments) Other Reaction: anaphalaxsis   . Lantus [Insulin Glargine]     Patient states "sulfate binder causes sinus infection/pneumonia".  Mack [place] [Levofloxacin In D5w] Other (See Comments)    Muscle aches  . Maxidex [Dexamethasone] Other (See Comments)    Insomnia, anxiety, dizziness  . Metoprolol-Hydrochlorothiazide Other (See Comments)     decreased bp  . Norvasc [Amlodipine] Other (See Comments)    BIL Ankle edema  . Novolog [Insulin Aspart]     Patient states "sulfate binder causes sinus infection/pneumonia". Tolerates Humalog.  . Peanut Oil     Other reaction(s): Rash And throat tightening  . Penciclovir     Other reaction(s): Muscle Pain  . Senna [Sennosides] Other (See Comments)    Pt states that it causes a stinging sensation  . Sulfonamide Derivatives Hives  . Titanium Other (See Comments)    Neck wouldn't heal with titanium cervical plate  . Yellow Dye     Other reaction(s): Bee stings all over body when eating/taking medications with red or yellow dye   . Adhesive [Tape] Hives and Rash    Diagnostic Studies/Procedures    Echo 07/10/17  Study Conclusions  - Left ventricle: The cavity size was normal. Wall thickness was   increased in a pattern of moderate LVH. Systolic function was  vigorous. The estimated ejection fraction was in the range of 65%   to 70%. Wall motion was normal; there were no regional wall   motion abnormalities. Doppler parameters are consistent with   abnormal left ventricular relaxation (grade 1 diastolic   dysfunction). - Aortic valve: There was no stenosis. - Mitral valve: Mildly calcified annulus. Mildly calcified leaflets   . There was no significant regurgitation. - Right ventricle: The cavity size was normal. Systolic function   was normal. - Tricuspid valve: Peak RV-RA gradient (S): 38 mm Hg. - Pulmonary arteries: PA peak pressure: 41 mm Hg (S). - Inferior vena cava: The vessel was normal in size. The   respirophasic diameter changes were in the normal range (>= 50%),   consistent with normal central venous pressure.  Impressions:  - Normal LV size with moderate LV hypertrophy. Vigorous systolic   function, EF 03-70%. Normal RV size and systolic function. No   significant valvular abnormalities. Mild pulmonary hypertension.  _____________   History of Present Illness     84 yo  With a history of systemic sclerosis, BOOP  , HTN, DM, COPD, hypothyroidism and CAD (s/p IWMI with stent ot RCA. repeat stnet to the RCA for instent restenois. She is also s/p DES to L main. Last cath In summer 2011 showed: 40% LAD lesion; 50% Lcx; small OM1 with 80% lesion; RCA with 20% instent restenosis.   A few days after Christmas she got sick  SOB  Fever.  Seen by PCP  She was put on  antibiotics (Augmentin)    Didn't get better  Seen in pulmonary clnicon 06/27/17  Felt weaker, sitill SOB  ABX switched  toDoxy  Placed on steroids.  Was admtted on 1/14 to 1/16  Flet to have acute bronchities, COPD flare.  Rx with empiric ABX in hosp  , steroids and inhalors  Per pts report she says her breathing never got better  She was sent home   Not on ABX.  Since her discharge she continued to feel bad with weakness and SOB and some back pain but no chest pain.     She  was admitted to tele with IV heparin and ASA, she rec'd Lasix X 1 in ER.   Hospital Course     Consultants: none.   Pt did well after lasix and her steroid taper was continued.  She was feeling better the next AM and isosorbide was added to meds.  She continued to improve and on the 20th she did receive one extra dose of lasix IV, her BP was improved.  She continued on her insulin for diabetes with glucose that ran 156 to 262.    Today pt has been seen by Dr. Percival Spanish and she is much better.  No chest pain and SOB is back at baseline.  She continues to be weak but still wants to go home, she has walked in the hall yesterday and today.  He found her stable for discharge.  Plan medical management.  Will discharge on imdur 30 mg beginning tomorrow.  Will have her follow up TOC.   _____________  Discharge Vitals Blood pressure (!) 110/57, pulse 81, temperature (!) 97.4 F (36.3 C), temperature source Oral, resp. rate 18, height 5\' 2"  (1.575 m), weight 129 lb 14.4 oz (58.9 kg), SpO2 100 %.  Filed Weights   07/14/17 0502 07/15/17 0517 07/16/17 0557  Weight: 128 lb 9.6 oz (58.3 kg) 130 lb 3.2 oz (59.1 kg) 129 lb 14.4 oz (58.9 kg)    Labs & Radiologic Studies    CBC Recent Labs    07/14/17 0118 07/15/17 0733  WBC 9.6 11.3*  HGB 13.3 13.1  HCT 40.8 40.3  MCV 86.6 87.8  PLT 218 093   Basic Metabolic Panel Recent Labs    07/15/17 0733 07/16/17 0435  NA 133* 134*  K 3.8 3.8  CL 94* 98*  CO2 26 26  GLUCOSE 234* 223*  BUN 20 18  CREATININE 0.90 0.90  CALCIUM 8.7* 8.7*   Liver Function Tests Recent Labs    07/14/17 0118  AST 17  ALT 17  ALKPHOS 62  BILITOT 0.8  PROT 5.8*  ALBUMIN 3.1*   No results for input(s): LIPASE, AMYLASE in the last 72 hours. Cardiac Enzymes Recent Labs    07/13/17 1953 07/14/17 0118 07/14/17 0842  TROPONINI 0.15* 0.13* 0.12*   BNP Invalid input(s): POCBNP D-Dimer No results for input(s): DDIMER in the last 72 hours. Hemoglobin A1C No  results for input(s): HGBA1C in the last 72 hours. Fasting Lipid Panel Recent Labs    07/14/17 0842  CHOL 226*  HDL 55  LDLCALC 140*  TRIG 154*  CHOLHDL 4.1   Thyroid Function Tests No results for input(s): TSH, T4TOTAL, T3FREE, THYROIDAB in the last 72 hours.  Invalid input(s): FREET3 _____________  Dg Chest 2 View  Result Date: 07/13/2017 CLINICAL DATA:  Shortness of breath. EXAM: CHEST  2 VIEW COMPARISON:  Chest x-ray and CT chest dated July 09, 2017. FINDINGS: The heart size and mediastinal contours  are within normal limits. Normal pulmonary vascularity. Slightly coarsened interstitial markings are similar to prior study. No focal consolidation, pleural effusion, or pneumothorax. No acute osseous abnormality. IMPRESSION: No active cardiopulmonary disease. Electronically Signed   By: Titus Dubin M.D.   On: 07/13/2017 12:41   Dg Chest 2 View  Result Date: 07/09/2017 CLINICAL DATA:  Shortness of breath. EXAM: CHEST  2 VIEW COMPARISON:  06/27/2017 FINDINGS: Heart size and pulmonary vascularity are normal and the lungs are clear. The small patchy infiltrate seen in the right upper lobe on the prior study has resolved. No effusions. Aortic atherosclerosis. Coronary artery stents in place. Surgical clips to the left of the trachea, probably due to previous thyroid surgery. IMPRESSION: Resolution of right upper lobe infiltrate.  No acute abnormalities. Aortic atherosclerosis. Electronically Signed   By: Lorriane Shire M.D.   On: 07/09/2017 14:24   Dg Chest 2 View  Result Date: 06/27/2017 CLINICAL DATA:  Dyspnea with cough and fever for 2 weeks. History of pneumonia, diabetes and myocardial infarction. EXAM: CHEST  2 VIEW COMPARISON:  Abdominal CT 11/17/2016, chest radiographs 09/26/2016 and chest CT 06/14/2016. FINDINGS: The heart size and mediastinal contours are stable. There is no definite adenopathy. There is aortic atherosclerosis and a coronary stent. There is new multifocal right  upper lobe nodularity which is best seen on the frontal examination. There is diffuse central airway thickening. No other focal nodularity or airspace opacity identified. Postsurgical changes consistent with previous resection of the left thyroid lobe again noted. There are degenerative changes in the spine. IMPRESSION: New ill-defined right upper lobe nodularity, likely inflammatory or atypical infection. Followup PA and lateral chest X-ray is recommended in 3-4 weeks following trial of antibiotic therapy to ensure resolution and exclude underlying malignancy. Electronically Signed   By: Richardean Sale M.D.   On: 06/27/2017 15:23   Ct Angio Chest Pe W And/or Wo Contrast  Result Date: 07/09/2017 CLINICAL DATA:  Cough, shortness of breath and positive D-dimer EXAM: CT ANGIOGRAPHY CHEST WITH CONTRAST TECHNIQUE: Multidetector CT imaging of the chest was performed using the standard protocol during bolus administration of intravenous contrast. Multiplanar CT image reconstructions and MIPs were obtained to evaluate the vascular anatomy. CONTRAST:  162mL ISOVUE-370 IOPAMIDOL (ISOVUE-370) INJECTION 76% COMPARISON:  Chest x-ray 07/09/2017, CT chest 06/14/2016 FINDINGS: Cardiovascular: Satisfactory opacification of the pulmonary arteries to the segmental level. No evidence of pulmonary embolism. Moderate aortic atherosclerosis. No aneurysmal dilatation. Normal heart size. Coronary vessel calcification. No pericardial effusion Mediastinum/Nodes: Midline trachea. No thyroid mass. Surgical changes in the region of left lobe of the thyroid. Esophagus within normal limits. No significantly enlarged mediastinal lymph nodes Lungs/Pleura: No pleural effusion. Mild ground-glass density in the posterior right upper lobe, series 10, image number 34. Mild nodular density in the lateral right upper lobe, series 10, image number 56. Upper Abdomen: No acute abnormality. Musculoskeletal: Degenerative changes. No acute or suspicious  lesion Review of the MIP images confirms the above findings. IMPRESSION: 1. Negative for acute pulmonary embolus 2. Mild ground-glass density in the posterior right upper lobe could relate to minimal residual inflammation or infiltrate. Slightly nodular density in the lateral right upper lobe, suspect that this also represents residual nodular infiltrate, however suggest short interval CT follow-up in 6-12 weeks to ensure resolution. Aortic Atherosclerosis (ICD10-I70.0). Electronically Signed   By: Donavan Foil M.D.   On: 07/09/2017 17:27   Disposition   Pt is being discharged home today in good condition.  Follow-up Plans & Appointments  Follow-up Information    Fay Records, MD Follow up on 07/19/2017.   Specialty:  Cardiology Why:  at 8:20 AM  keep this appt to make sure new med is working.   Contact information: [place] [address] [phone]            Discharge Medications   Allergies as of 07/16/2017      Reactions   Sulfa Antibiotics    Other reaction(s): Other (See Comments) Other Reaction: anaphalaxsis   Lantus [insulin Glargine]    Patient states "sulfate binder causes sinus infection/pneumonia".   Levaquin [levofloxacin In D5w] Other (See Comments)   Muscle aches   Maxidex [dexamethasone] Other (See Comments)   Insomnia, anxiety, dizziness   Metoprolol-hydrochlorothiazide Other (See Comments)    decreased bp   Norvasc [amlodipine] Other (See Comments)   BIL Ankle edema   Novolog [insulin Aspart]    Patient states "sulfate binder causes sinus infection/pneumonia". Tolerates Humalog.   Peanut Oil    Other reaction(s): Rash And throat tightening   Penciclovir    Other reaction(s): Muscle Pain   Senna [sennosides] Other (See Comments)   Pt states that it causes a stinging sensation   Sulfonamide Derivatives Hives   Titanium Other (See Comments)   Neck wouldn't heal with titanium cervical plate   Yellow Dye    Other  reaction(s): Bee stings all over body when eating/taking medications with red or yellow dye    Adhesive [tape] Hives, Rash      Medication List    STOP taking these medications   oxymetazoline 0.05 % nasal spray Commonly known as:  AFRIN     TAKE these medications   albuterol 108 (90 Base) MCG/ACT inhaler Commonly known as:  PROVENTIL HFA;VENTOLIN HFA Inhale 2 puffs into the lungs every 6 (six) hours as needed for wheezing or shortness of breath.   aspirin EC 81 MG tablet Take 1 tablet (81 mg total) by mouth daily.   budesonide-formoterol 160-4.5 MCG/ACT inhaler Commonly known as:  SYMBICORT Inhale 2 puffs into the lungs 2 (two) times daily.   cetirizine 10 MG tablet Commonly known as:  ZYRTEC Take 10 mg by mouth daily after breakfast.   esomeprazole 40 MG capsule Commonly known as:  NEXIUM Take 40 mg by mouth daily.   fluticasone 50 MCG/ACT nasal spray Commonly known as:  FLONASE Place 1 spray into both nostrils daily.   guaiFENesin 600 MG 12 hr tablet Commonly known as:  MUCINEX Take 600 mg by mouth daily as needed for to loosen phlegm.   HUMALOG KWIKPEN 100 UNIT/ML KiwkPen Generic drug:  insulin lispro Inject 5-6 Units into the skin 3 (three) times daily with meals. Inject 6 units subcutaneously with breakfast and lunch, inject 5 units with supper   HYDROcodone-acetaminophen 7.5-325 MG tablet Commonly known as:  NORCO Take 1 tablet by mouth every 6 (six) hours as needed for moderate pain.   imipramine 25 MG tablet Commonly known as:  TOFRANIL Take 75 mg by mouth at bedtime.   isosorbide dinitrate 10 MG tablet Commonly known as:  ISORDIL Take 1 tablet (10 mg total) by mouth 2 (two) times daily.   levothyroxine 100 MCG tablet Commonly known as:  SYNTHROID, LEVOTHROID Take 100 mcg by mouth daily before breakfast.   montelukast 10 MG tablet Commonly known as:  SINGULAIR Take 10 mg by mouth at bedtime.   predniSONE 5 MG tablet Commonly known as:   DELTASONE Take 40 mg by mouth  daily, for 2 days, then, Take 30 mg by mouth daily, for 2 days, then, Take 20 mg by mouth daily, for 2 days, then, Take 10 mg by mouth daily, for 2 days, then, Resume usual dosing of 5 mg daily.   triazolam 0.25 MG tablet Commonly known as:  HALCION Take 0.25 mg by mouth at bedtime as needed for sleep. For sleep         Outstanding Labs/Studies   none  Duration of Discharge Encounter   Greater than 30 minutes including physician time.  Signed, Cecilie Kicks NP 07/16/2017, 12:44 PM

## 2017-07-16 NOTE — Plan of Care (Signed)
  Education: Knowledge of General Education information will improve 07/16/2017 1006 - Progressing by Imagene Gurney, RN

## 2017-07-16 NOTE — Progress Notes (Signed)
Orders received for pt discharge.  Discharge summary printed and reviewed with pt.  Explained medication regimen, and pt had no further questions at this time.  IV removed and site remains clean, dry, intact.  Telemetry removed.  Pt in stable condition and awaiting transport. 

## 2017-07-16 NOTE — Progress Notes (Signed)
Progress Note  Patient Name: [name] Date of Encounter: 07/16/2017  Primary Cardiologist:   Dorris Carnes, MD   Subjective   She thinks that her breathing is at baseline.  No pain.  She ambulated in the hall yesterday and today.  Weak but she wants to go home.   Inpatient Medications    Scheduled Meds: . aspirin EC  81 mg Oral Daily  . fluticasone  1 spray Each Nare Daily  . imipramine  75 mg Oral QHS  . insulin lispro  0-15 Units Subcutaneous TID WC  . insulin lispro  0-5 Units Subcutaneous QHS  . insulin lispro  5 Units Subcutaneous TID WC  . isosorbide dinitrate  10 mg Oral BID  . levothyroxine  100 mcg Oral QAC breakfast  . loratadine  10 mg Oral Daily  . montelukast  10 mg Oral QHS  . pantoprazole  40 mg Oral Daily  . sodium chloride flush  3 mL Intravenous Q12H   Continuous Infusions: . sodium chloride     PRN Meds: sodium chloride, acetaminophen, albuterol, ALPRAZolam, guaiFENesin, HYDROcodone-acetaminophen, nitroGLYCERIN, ondansetron (ZOFRAN) IV, sodium chloride flush, triazolam   Vital Signs    Vitals:   07/15/17 1226 07/15/17 1559 07/15/17 1939 07/16/17 0557  BP: (!) 106/45 (!) 132/46 (!) 120/50 (!) 125/41  Pulse: 99 95 85 78  Resp: 17 16 16 16   Temp: 98.2 F (36.8 C) 98.3 F (36.8 C) 98.6 F (37 C) 97.8 F (36.6 C)  TempSrc: Oral Oral Oral Oral  SpO2: 96% 99% 100% 96%  Weight:    129 lb 14.4 oz (58.9 kg)  Height:        Intake/Output Summary (Last 24 hours) at 07/16/2017 1153 Last data filed at 07/16/2017 0900 Gross per 24 hour  Intake 1340 ml  Output 1200 ml  Net 140 ml   Filed Weights   07/14/17 0502 07/15/17 0517 07/16/17 0557  Weight: 128 lb 9.6 oz (58.3 kg) 130 lb 3.2 oz (59.1 kg) 129 lb 14.4 oz (58.9 kg)    Telemetry    NSR - Personally Reviewed  ECG    NA - Personally Reviewed  Physical Exam   GEN: No acute distress.   Neck: No  JVD Cardiac: RRR,  no murmurs, rubs, or gallops.  Respiratory: Clear  to auscultation  bilaterally. GI: Soft, nontender, non-distended  MS: No  edema; No deformity. Neuro:  Nonfocal  Psych: Normal affect   Labs    Chemistry Recent Labs  Lab 07/13/17 1047 07/14/17 0118 07/15/17 0733 07/16/17 0435  NA 138 132* 133* 134*  K 3.6 3.6 3.8 3.8  CL 98* 93* 94* 98*  CO2 27 25 26 26   GLUCOSE 184* 200* 234* 223*  BUN 20 21* 20 18  CREATININE 0.70 0.93 0.90 0.90  CALCIUM 8.7* 8.4* 8.7* 8.7*  PROT 6.5 5.8*  --   --   ALBUMIN 3.3* 3.1*  --   --   AST 21 17  --   --   ALT 20 17  --   --   ALKPHOS 69 62  --   --   BILITOT 0.6 0.8  --   --   GFRNONAA >60 55* 57* 57*  GFRAA >60 >60 >60 >60  ANIONGAP 13 14 13 10      Hematology Recent Labs  Lab 07/13/17 1047 07/14/17 0118 07/15/17 0733  WBC 14.4* 9.6 11.3*  RBC 5.05 4.71 4.59  HGB 14.5 13.3 13.1  HCT 44.0 40.8 40.3  MCV  87.1 86.6 87.8  MCH 28.7 28.2 28.5  MCHC 33.0 32.6 32.5  RDW 13.8 13.5 13.8  PLT 251 218 222    Cardiac Enzymes Recent Labs  Lab 07/10/17 0843 07/13/17 1953 07/14/17 0118 07/14/17 0842  TROPONINI <0.03 0.15* 0.13* 0.12*    Recent Labs  Lab 07/09/17 1436 07/13/17 1119 07/13/17 1420  TROPIPOC 0.00 0.07 0.18*     BNP Recent Labs  Lab 07/09/17 1411 07/13/17 1047  BNP 39.5 214.7*     DDimer  Recent Labs  Lab 07/09/17 1456  DDIMER 0.83*     Radiology    No results found.  Cardiac Studies   Echo 07/10/17  Study Conclusions  - Left ventricle: The cavity size was normal. Wall thickness was   increased in a pattern of moderate LVH. Systolic function was   vigorous. The estimated ejection fraction was in the range of 65%   to 70%. Wall motion was normal; there were no regional wall   motion abnormalities. Doppler parameters are consistent with   abnormal left ventricular relaxation (grade 1 diastolic   dysfunction). - Aortic valve: There was no stenosis. - Mitral valve: Mildly calcified annulus. Mildly calcified leaflets   . There was no significant  regurgitation. - Right ventricle: The cavity size was normal. Systolic function   was normal. - Tricuspid valve: Peak RV-RA gradient (S): 38 mm Hg. - Pulmonary arteries: PA peak pressure: 41 mm Hg (S). - Inferior vena cava: The vessel was normal in size. The   respirophasic diameter changes were in the normal range (>= 50%),   consistent with normal central venous pressure.  Impressions:  - Normal LV size with moderate LV hypertrophy. Vigorous systolic   function, EF 70-01%. Normal RV size and systolic function. No   significant valvular abnormalities. Mild pulmonary hypertension.   Patient Profile     84 y.o. female with a history of systemic sclerosis, BOOP , HTN, DM, COPD, hypothyroidismand CAD (s/p IWMI with stent ot RCA. repeat stnet to the RCA for instent restenois. She is also s/p DES to L main. Last cath In summer 2011 showed: 40% LAD lesion; 50% Lcx; small OM1 with 80% lesion; RCA with 20% instent restenosis.  Admitted 07/13/17 weak SOB and recent acute bronchitis wit COPD exacerbation.    Assessment & Plan   SOB with prior CAD pk troponin 0.18  Was on IV heparin and asa,  Today she is + 377 and wt same as admit.  She did have IV lasix yesterday with 0.3 lb wt loss.  I think she could go home with Lasix 20 mg PO PRN.     CAD isosorbide added yesterday, yesterday thought was to avoid cath and plan medical management.  Home with Imdur  HTN BP 1290/50 to 125/41 stable on isordil   BOOP per IM on steroids.  D/C today   For questions or updates, please contact [place] Please consult [email or web address] for contact info under Cardiology/STEMI.   Signed, Minus Breeding, MD  07/16/2017, 11:53 AM

## 2017-07-16 NOTE — Evaluation (Signed)
Physical Therapy Evaluation Patient Details Name: [name] MRN: [id number] DOB: [birth year] Today's Date: 07/16/2017   History of Present Illness  Pt is an 84 y.o. admitted 07/13/17 with worsening SOB and generalized weakness. CT chest done negative for PE; showed areas in right upper lobe concerning for infiltrate versus inflammation. PMH includes CAP, hypothyroidism, anemia of chronic disease, HTN, CAD. Of note, recently admitted 6 days ago with similar symptoms.    Clinical Impression  Pt presents with an overall decrease in functional mobility secondary to above. PTA, pt indep with ambulation and ADLs; has PRN assist from family and neighbor. Pt reports legally blind, but manages well at home and has assist when in community. Today, able to transfer and ambulate with intermittent supervision for safety. SpO2 94% on RA throughout. Pt would benefit from continued acute PT services to maximize functional mobility and independence prior to d/c with HHPT services.     Follow Up Recommendations Home health PT;Supervision - Intermittent    Equipment Recommendations  None recommended by PT    Recommendations for Other Services       Precautions / Restrictions Precautions Precautions: Fall;Other (comment) Precaution Comments: legally blind, can make out shapes; denies h/o falls in past 1 year Restrictions Weight Bearing Restrictions: No      Mobility  Bed Mobility Overal bed mobility: Independent                Transfers Overall transfer level: Needs assistance Equipment used: None Transfers: Sit to/from Stand Sit to Stand: Supervision         General transfer comment: legally blind, unfamiliar environment  Ambulation/Gait Ambulation/Gait assistance: Supervision Ambulation Distance (Feet): 200 Feet Assistive device: None Gait Pattern/deviations: Step-through pattern;Decreased stride length Gait velocity: Decreased   General Gait Details: Slow, steady amb  with RW and supervision for balance. 1x standing rest break secondary to SOB. SpO2 94% on RA throughout.  Stairs            Wheelchair Mobility    Modified Rankin (Stroke Patients Only)       Balance Overall balance assessment: Needs assistance   Sitting balance-Leahy Scale: Good       Standing balance-Leahy Scale: Good                               Pertinent Vitals/Pain Pain Assessment: No/denies pain    Home Living Family/patient expects to be discharged to:: Private residence Living Arrangements: Alone Available Help at Discharge: Family;Available PRN/intermittently;Neighbor Type of Home: House Home Access: Level entry     Home Layout: Two level;Able to live on main level with bedroom/bathroom Home Equipment: Walker - 4 wheels;Grab bars - toilet;Grab bars - tub/shower;Shower seat Additional Comments: stays on first floor    Prior Function Level of Independence: Independent         Comments: independent with mobility and ADLs, doesn't drive secondary to poor vision. Daughter and neighbor assist with errands; sometimes uses [employer] for transportation     Hand Dominance   Dominant Hand: Right    Extremity/Trunk Assessment   Upper Extremity Assessment Upper Extremity Assessment: Overall WFL for tasks assessed    Lower Extremity Assessment Lower Extremity Assessment: Generalized weakness       Communication   Communication: No difficulties  Cognition Arousal/Alertness: Awake/alert Behavior During Therapy: WFL for tasks assessed/performed Overall Cognitive Status: Within Functional Limits for tasks assessed  General Comments      Exercises     Assessment/Plan    PT Assessment Patient needs continued PT services  PT Problem List Decreased strength;Decreased activity tolerance;Decreased balance;Decreased mobility       PT Treatment Interventions DME instruction;Gait  training;Functional mobility training;Therapeutic activities;Therapeutic exercise;Balance training;Patient/family education;Stair training    PT Goals (Current goals can be found in the Care Plan section)  Acute Rehab PT Goals Patient Stated Goal: Return home PT Goal Formulation: With patient Time For Goal Achievement: 07/30/17 Potential to Achieve Goals: Good    Frequency Min 3X/week   Barriers to discharge        Co-evaluation               AM-PAC PT "6 Clicks" Daily Activity  Outcome Measure Difficulty turning over in bed (including adjusting bedclothes, sheets and blankets)?: None Difficulty moving from lying on back to sitting on the side of the bed? : None Difficulty sitting down on and standing up from a chair with arms (e.g., wheelchair, bedside commode, etc,.)?: None Help needed moving to and from a bed to chair (including a wheelchair)?: A Little Help needed walking in hospital room?: A Little Help needed climbing 3-5 steps with a railing? : A Little 6 Click Score: 21    End of Session Equipment Utilized During Treatment: Gait belt Activity Tolerance: Patient tolerated treatment well Patient left: in chair;with call bell/phone within reach Nurse Communication: Mobility status PT Visit Diagnosis: Other abnormalities of gait and mobility (R26.89)    Time: 2482-5003 PT Time Calculation (min) (ACUTE ONLY): 23 min   Charges:   PT Evaluation $PT Eval Moderate Complexity: 1 Mod PT Treatments $Gait Training: 8-22 mins   PT G Codes:       Mabeline Caras, PT, DPT Acute Rehab Services  Pager: [place] 07/16/2017, 9:05 AM

## 2017-07-16 NOTE — Consult Note (Signed)
   [facility] CM Inpatient Consult   07/16/2017  [name] December 29, 1930 [id number]  Patient screened for potential [place] Management services. Patient is in the [place] of the [place] [facility] services under patient's Medicare  plan. Patient is active with [facility].  Spoke with inpatient RNCM, Hassan Rowan of patient being in the [facility] program. [facility] will sign off as she will have her community needs met with [facility]. For questions contact:   Natividad Brood, RN BSN [place] [facility] Liaison  [phone] business mobile phone Toll free office [phone]

## 2017-07-18 ENCOUNTER — Telehealth: Payer: Self-pay | Admitting: Internal Medicine

## 2017-07-18 NOTE — Telephone Encounter (Signed)
Her BP was elevated in the hospital and with her chest pain this was added.  If her BP is less than 244 systolic she should hold it.  The isordil

## 2017-07-18 NOTE — Telephone Encounter (Signed)
Spoke with patient regarding her concern about the Isordil 10mg  bid medication she was prescribed on 07/16/2017.  She was fine the past 2 days.    She says that she has been active all day doing laundry and cooking and now she cannot take a deep breath and her esophagus is hard to the touch, asking if it could be the medication.  She has an appointment in the morning with Dr. Harrington Challenger and is reluctant to take her evening dose of medication.

## 2017-07-18 NOTE — Telephone Encounter (Signed)
Pt c/o medication issue:  1. Name of Medication:Isosorbide  20 mg   2. How are you currently taking this medication (dosage and times per day)? Half a pill twice a day   3. Are you having a reaction (difficulty breathing--STAT)?Cant get a deep breath   4. What is your medication issue? Thinks the medication is making her throat hard .

## 2017-07-19 ENCOUNTER — Ambulatory Visit (INDEPENDENT_AMBULATORY_CARE_PROVIDER_SITE_OTHER): Payer: Medicare Other | Admitting: Internal Medicine

## 2017-07-19 ENCOUNTER — Encounter (INDEPENDENT_AMBULATORY_CARE_PROVIDER_SITE_OTHER): Payer: Self-pay

## 2017-07-19 ENCOUNTER — Encounter: Payer: Self-pay | Admitting: Internal Medicine

## 2017-07-19 ENCOUNTER — Ambulatory Visit: Payer: Medicare Other | Admitting: Internal Medicine

## 2017-07-19 VITALS — BP 138/58 | HR 82 | Ht 62.0 in | Wt 132.0 lb

## 2017-07-19 DIAGNOSIS — E782 Mixed hyperlipidemia: Secondary | ICD-10-CM | POA: Diagnosis not present

## 2017-07-19 DIAGNOSIS — I10 Essential (primary) hypertension: Secondary | ICD-10-CM | POA: Diagnosis not present

## 2017-07-19 DIAGNOSIS — I2510 Atherosclerotic heart disease of native coronary artery without angina pectoris: Secondary | ICD-10-CM

## 2017-07-19 DIAGNOSIS — J8489 Other specified interstitial pulmonary diseases: Secondary | ICD-10-CM

## 2017-07-19 NOTE — Progress Notes (Signed)
Cardiology Office Note   Date:  07/19/2017   ID:  [name], [name] August 20, 1930, MRN [id number]  PCP:  Merrilee Seashore, MD  Cardiologist:   Dorris Carnes, MD   F/U of CAD    History of Present Illness: [name] is a 84 y.o. female with a history of CAD (s/p IWMI with stent to RCA;  Repeat stent to RCA for instent restenosis; s/p DES to [facility];  Last cath 2011:  40% LAD; 50% Lcx ; 80% small OM1: RCA 20%)  Also a history of BOOP  Sepsis in 2016 She was just admitted   Woke up in swat  Was SOB   Presented to ED   I saw her in ED  Concerned about unstable angina  Got lasix for some volume increase Over weekend with mild diuresis she improved cilnically  Sent home  Felt due to some volume overload and not [place]  She did OK Monday and Tues  Wed AM felt OK  Then around 3 OK got hard sensation in throad  Difficut to swallow  BLames it on isorbide Stopped taking    Today neck still feels full  No CP  Breathing is OK    Current Meds  Medication Sig  . albuterol (PROVENTIL HFA;VENTOLIN HFA) 108 (90 Base) MCG/ACT inhaler Inhale 2 puffs into the lungs every 6 (six) hours as needed for wheezing or shortness of breath.  [name] aspirin EC 81 MG tablet Take 1 tablet (81 mg total) by mouth daily.  . budesonide-formoterol (SYMBICORT) 160-4.5 MCG/ACT inhaler Inhale 2 puffs into the lungs 2 (two) times daily.  . cetirizine (ZYRTEC) 10 MG tablet Take 10 mg by mouth daily after breakfast.   . esomeprazole (NEXIUM) 40 MG capsule Take 40 mg by mouth daily.   . fluticasone (FLONASE) 50 MCG/ACT nasal spray Place 1 spray into both nostrils daily.  [name] guaiFENesin (MUCINEX) 600 MG 12 hr tablet Take 600 mg by mouth daily as needed for to loosen phlegm.   [name] HYDROcodone-acetaminophen (NORCO) 7.5-325 MG tablet Take 1 tablet by mouth every 6 (six) hours as needed for moderate pain.  [name] imipramine (TOFRANIL) 25 MG tablet Take 75 mg by mouth at bedtime.   . insulin lispro (HUMALOG KWIKPEN) 100 UNIT/ML KiwkPen  Inject 5-6 Units into the skin 3 (three) times daily with meals. Inject 6 units subcutaneously with breakfast and lunch, inject 5 units with supper  . levothyroxine (SYNTHROID, LEVOTHROID) 100 MCG tablet Take 100 mcg by mouth daily before breakfast.  . montelukast (SINGULAIR) 10 MG tablet Take 10 mg by mouth at bedtime.   . predniSONE (DELTASONE) 5 MG tablet Take 40 mg by mouth daily, for 2 days, then, Take 30 mg by mouth daily, for 2 days, then, Take 20 mg by mouth daily, for 2 days, then, Take 10 mg by mouth daily, for 2 days, then, Resume usual dosing of 5 mg daily.  . triazolam (HALCION) 0.25 MG tablet Take 0.25 mg by mouth at bedtime as needed for sleep. For sleep     Allergies:   Sulfa antibiotics; Lantus [insulin glargine]; Levaquin [levofloxacin in d5w]; Maxidex [dexamethasone]; Metoprolol-hydrochlorothiazide; Norvasc [amlodipine]; Novolog [insulin aspart]; Peanut oil; Penciclovir; Senna [sennosides]; Sulfonamide derivatives; Titanium; Yellow dye; and Adhesive [tape]   Past Medical History:  Diagnosis Date  . Adenomatous colon polyp   . Arthritis   . Blood transfusion   . Cancer [facility])    cervical cancer pre hysterectomy  . Cervical disc disease   . Chronic  sinusitis   . CONSTIPATION, CHRONIC 06/26/2007   Qualifier: Diagnosis of  By: Nils Pyle CMA ([place]), Mearl Latin    . COPD (chronic obstructive pulmonary disease) ([place])   . Coronary artery disease   . CVA (cerebral infarction) 1991  . Depression   . Diabetes mellitus   . Diverticulosis   . 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 Dyslipidemia   . Emphysema   . Fibromyalgia   . GERD (gastroesophageal reflux disease)   . Hypertension   . Hypothyroidism   . IBS (irritable bowel syndrome)   . Legally blind   . Macular degeneration   . Myocardial infarction [facility]) may 27th 2007  . Pneumonia 07/16/2013    HX PNEUMONIA  . Pulmonary hypertension ([place])   . Raynaud's phenomenon   . Scleroderma ([place])   . Sjogren's disease ([place])   . Sleep  apnea    STOP BANG SCORE 5  . Stroke [facility]) 1990   brain stem stroke - weakness rt hand  . Zenker's diverticulum     Past Surgical History:  Procedure Laterality Date  . APPENDECTOMY    . BALLOON DILATION N/A 07/15/2015   Procedure: BALLOON DILATION;  Surgeon: Milus Banister, MD;  Location: [facility];  Service: Endoscopy;  Laterality: N/A;  . BILATERAL OOPHORECTOMY  2002  . BRONCHOSCOPY  02-22-09  . CARDIAC CATHETERIZATION     x 3  . CATARACT EXTRACTION    . CHOLECYSTECTOMY  1965  . COLON SURGERY  2014   colon resection  . colonectomy    . ESOPHAGOGASTRODUODENOSCOPY (EGD) WITH ESOPHAGEAL DILATION N/A 06/18/2013   Procedure: ESOPHAGOGASTRODUODENOSCOPY (EGD) WITH ESOPHAGEAL DILATION;  Surgeon: Inda Castle, MD;  Location: [facility];  Service: Endoscopy;  Laterality: N/A;  . FLEXIBLE SIGMOIDOSCOPY N/A 07/15/2015   Procedure: FLEXIBLE SIGMOIDOSCOPY;  Surgeon: Milus Banister, MD;  Location: [facility];  Service: Endoscopy;  Laterality: N/A;  . lobe thyroid removal    . PROCTOSCOPY  03/18/2012   Procedure: PROCTOSCOPY;  Surgeon: Adin Hector, MD;  Location: [facility];  Service: General;  Laterality: N/A;  rigid procto  . RADIOLOGY WITH ANESTHESIA N/A 03/09/2015   Procedure: MRI CERVICAL SPINE WITHOUT AND LUMBER WITHOUT;  Surgeon: Medication Radiologist, MD;  Location: [facility];  Service: Radiology;  Laterality: N/A;  . [place]   and implantation of a plate   . STENTED CARDIAC  ARTERY  2007 / 2007 / 2010   X 3 STENT  . THYROID LOBECTOMY  1991   removal of left lobe thyroid  . TOTAL ABDOMINAL HYSTERECTOMY  1973     Social History:  The patient  reports that she quit smoking about 29 years ago. Her smoking use included cigarettes. She has a 40.00 pack-year smoking history. she has never used smokeless tobacco. She reports that she does not drink alcohol or use drugs.   Family History:  The patient's family history includes Cancer in her brother and sister; Diabetes in  her brother, brother, father, and sister; Heart disease in her father; Other (age of onset: 22) in her mother; Stroke (age of onset: 59) in her father.    ROS:  Please see the history of present illness. All other systems are reviewed and  Negative to the above problem except as noted.    PHYSICAL EXAM: VS:  BP (!) 138/58   Pulse 82   Ht 5\' 2"  (1.575 m)   Wt 132 lb (59.9 kg)   BMI 24.14 kg/m   GEN: Well nourished,  well developed, in no acute distress  HEENT: normal  Neck:  JVP is normal  No , carotid bruits, or masses Cardiac: RRR; no murmurs, rubs, or gallops,No signif edema  Respiratory:  clear to auscultation bilaterally, normal work of breathing GI: soft, nontender, nondistended, + BS  No hepatomegaly  MS: no deformity Moving all extremities   Skin: warm and dry, no rash Neuro:  Strength and sensation are intact Psych: euthymic mood, full affect   EKG:  EKG is  ordered today.  SR 82  Sl ST depression, biphasic T waves  Inferior and laterally    Lipid Panel    Component Value Date/Time   CHOL 226 (H) 07/14/2017 0842   TRIG 154 (H) 07/14/2017 0842   HDL 55 07/14/2017 0842   CHOLHDL 4.1 07/14/2017 0842   VLDL 31 07/14/2017 0842   LDLCALC 140 (H) 07/14/2017 0842   LDLDIRECT 127.6 05/15/2013 1422      Wt Readings from Last 3 Encounters:  07/19/17 132 lb (59.9 kg)  07/16/17 129 lb 14.4 oz (58.9 kg)  07/09/17 131 lb 13.4 oz (59.8 kg)      ASSESSMENT AND PLAN:  1  "spell"  I am not convinced episode yesterday of neck/esophagus feelng hard is angina  It may indeed be a medicine sensitivity, allergic reaction.  May be a little better today   ON prednisone  No wheezing. Keep on same meds   Follow  2.  CAD  Admitted this weekend  Trivial elevation of troponin, probably demand ischemia in setting of CAD and volume increase    Pt's symptoms improved with diruesis   Mon and Tues she felt good  This goes against actve angina  2  HTN    BP is fair     3  HL  Intolerant  to statins in past  Diet    4  BOOP  Has appt soon in pulmonary    F/U in the fall  Current medicines are reviewed at length with the patient today.  The patient does not have concerns regarding medicines.  Signed, Dorris Carnes, MD  07/19/2017 9:05 AM    [address], [place], [place]  [address] Phone: [phone]; Fax: [phone]

## 2017-07-19 NOTE — Patient Instructions (Signed)
Medication Instructions:  Your physician recommends that you continue on your current medications as directed. Please refer to the Current Medication list given to you today.   Labwork: none  Testing/Procedures: none  Follow-Up: You are scheduled to see Dr. Harrington Challenger on February 28,2019 at 4:00  Any Other Special Instructions Will Be Listed Below (If Applicable).     If you need a refill on your cardiac medications before your next appointment, please call your pharmacy.

## 2017-07-19 NOTE — Telephone Encounter (Signed)
Patient has appointment with Dr. Harrington Challenger this morning.. Will bring this to [facility] and Dr. Harrington Challenger' attention.

## 2017-08-14 ENCOUNTER — Other Ambulatory Visit: Payer: Self-pay | Admitting: Pulmonary Disease

## 2017-08-14 DIAGNOSIS — E109 Type 1 diabetes mellitus without complications: Secondary | ICD-10-CM | POA: Diagnosis not present

## 2017-08-14 DIAGNOSIS — E039 Hypothyroidism, unspecified: Secondary | ICD-10-CM | POA: Diagnosis not present

## 2017-08-14 DIAGNOSIS — E782 Mixed hyperlipidemia: Secondary | ICD-10-CM | POA: Diagnosis not present

## 2017-08-14 DIAGNOSIS — I10 Essential (primary) hypertension: Secondary | ICD-10-CM | POA: Diagnosis not present

## 2017-08-20 DIAGNOSIS — E782 Mixed hyperlipidemia: Secondary | ICD-10-CM | POA: Diagnosis not present

## 2017-08-20 DIAGNOSIS — E109 Type 1 diabetes mellitus without complications: Secondary | ICD-10-CM | POA: Diagnosis not present

## 2017-08-20 DIAGNOSIS — E039 Hypothyroidism, unspecified: Secondary | ICD-10-CM | POA: Diagnosis not present

## 2017-08-20 DIAGNOSIS — I10 Essential (primary) hypertension: Secondary | ICD-10-CM | POA: Diagnosis not present

## 2017-08-23 ENCOUNTER — Ambulatory Visit (INDEPENDENT_AMBULATORY_CARE_PROVIDER_SITE_OTHER): Payer: Medicare Other | Admitting: Internal Medicine

## 2017-08-23 ENCOUNTER — Encounter: Payer: Self-pay | Admitting: Internal Medicine

## 2017-08-23 VITALS — BP 132/60 | HR 86 | Ht 62.0 in | Wt 134.4 lb

## 2017-08-23 DIAGNOSIS — R5383 Other fatigue: Secondary | ICD-10-CM

## 2017-08-23 DIAGNOSIS — I2510 Atherosclerotic heart disease of native coronary artery without angina pectoris: Secondary | ICD-10-CM

## 2017-08-23 DIAGNOSIS — J8489 Other specified interstitial pulmonary diseases: Secondary | ICD-10-CM

## 2017-08-23 DIAGNOSIS — R0602 Shortness of breath: Secondary | ICD-10-CM | POA: Diagnosis not present

## 2017-08-23 DIAGNOSIS — E782 Mixed hyperlipidemia: Secondary | ICD-10-CM

## 2017-08-23 NOTE — Progress Notes (Signed)
Cardiology Office Note   Date:  08/23/2017   ID:  Renee, [name] Oct 14, 1930, MRN [id number]  PCP:  Merrilee Seashore, MD  Cardiologist:   Dorris Carnes, MD   F/U of CAD    History of Present Illness: [name] is a 84 y.o. female with a history of CAD (s/p IWMI with stent to RCA;  Repeat stent to RCA for instent restenosis; s/p DES to [facility];  Last cath 2011:  40% LAD; 50% Lcx ; 80% small OM1: RCA 20%)  Also a history of BOOP  Sepsis in 2016 She was just admitted  In Jan for possible unstable angina  Diuresed Sent home She did OK initially but then had problems with iImdur  ? Allergic Rx    Recomm continued f/u  She returns today for reeval  Still does not have energy like she did prior to Christmas  Has to rest if she is at store  Did nto do this before   Denies CP   Energy is not good overall    Current Meds  Medication Sig  . aspirin EC 81 MG tablet Take 1 tablet (81 mg total) by mouth daily.  . cetirizine (ZYRTEC) 10 MG tablet Take 10 mg by mouth daily after breakfast.   . esomeprazole (NEXIUM) 40 MG capsule Take 40 mg by mouth daily.   . fluticasone (FLONASE) 50 MCG/ACT nasal spray Place 1 spray into both nostrils daily.  [name] guaiFENesin (MUCINEX) 600 MG 12 hr tablet Take 600 mg by mouth daily as needed for to loosen phlegm.   [name] HYDROcodone-acetaminophen (NORCO) 7.5-325 MG tablet Take 1 tablet by mouth every 6 (six) hours as needed for moderate pain.  [name] imipramine (TOFRANIL) 25 MG tablet Take 75 mg by mouth at bedtime.   . insulin lispro (HUMALOG KWIKPEN) 100 UNIT/ML KiwkPen Inject 5-6 Units into the skin 3 (three) times daily with meals. Inject 6 units subcutaneously with breakfast and lunch, inject 5 units with supper  . levothyroxine (SYNTHROID, LEVOTHROID) 100 MCG tablet Take 100 mcg by mouth daily before breakfast.  . montelukast (SINGULAIR) 10 MG tablet Take 10 mg by mouth at bedtime.   . predniSONE (DELTASONE) 5 MG tablet TAKE 1 TABLET EVERY DAY WITH  BREAKFAST.  [name] triazolam (HALCION) 0.25 MG tablet Take 0.25 mg by mouth at bedtime as needed for sleep. For sleep     Allergies:   Sulfa antibiotics; Lantus [insulin glargine]; Levaquin [levofloxacin in d5w]; Maxidex [dexamethasone]; Metoprolol-hydrochlorothiazide; Norvasc [amlodipine]; Novolog [insulin aspart]; Peanut oil; Penciclovir; Senna [sennosides]; Sulfonamide derivatives; Titanium; Yellow dye; and Adhesive [tape]   Past Medical History:  Diagnosis Date  . Adenomatous colon polyp   . Arthritis   . Blood transfusion   . Cancer [facility])    cervical cancer pre hysterectomy  . Cervical disc disease   . Chronic sinusitis   . CONSTIPATION, CHRONIC 06/26/2007   Qualifier: Diagnosis of  By: Nils Pyle CMA ([place]), Mearl Latin    . COPD (chronic obstructive pulmonary disease) ([place])   . Coronary artery disease   . CVA (cerebral infarction) 1991  . Depression   . Diabetes mellitus   . Diverticulosis   . 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 Dyslipidemia   . Emphysema   . Fibromyalgia   . GERD (gastroesophageal reflux disease)   . Hypertension   . Hypothyroidism   . IBS (irritable bowel syndrome)   . Legally blind   . Macular degeneration   . Myocardial infarction [facility]) may 27th 2007  .  Pneumonia 07/16/2013    HX PNEUMONIA  . Pulmonary hypertension ([place])   . Raynaud's phenomenon   . Scleroderma ([place])   . Sjogren's disease ([place])   . Sleep apnea    STOP BANG SCORE 5  . Stroke [facility]) 1990   brain stem stroke - weakness rt hand  . Zenker's diverticulum     Past Surgical History:  Procedure Laterality Date  . APPENDECTOMY    . BALLOON DILATION N/A 07/15/2015   Procedure: BALLOON DILATION;  Surgeon: Milus Banister, MD;  Location: [facility];  Service: Endoscopy;  Laterality: N/A;  . BILATERAL OOPHORECTOMY  2002  . BRONCHOSCOPY  02-22-09  . CARDIAC CATHETERIZATION     x 3  . CATARACT EXTRACTION    . CHOLECYSTECTOMY  1965  . COLON SURGERY  2014   colon resection  . colonectomy    .  ESOPHAGOGASTRODUODENOSCOPY (EGD) WITH ESOPHAGEAL DILATION N/A 06/18/2013   Procedure: ESOPHAGOGASTRODUODENOSCOPY (EGD) WITH ESOPHAGEAL DILATION;  Surgeon: Inda Castle, MD;  Location: [place];  Service: Endoscopy;  Laterality: N/A;  . FLEXIBLE SIGMOIDOSCOPY N/A 07/15/2015   Procedure: FLEXIBLE SIGMOIDOSCOPY;  Surgeon: Milus Banister, MD;  Location: [facility];  Service: Endoscopy;  Laterality: N/A;  . lobe thyroid removal    . PROCTOSCOPY  03/18/2012   Procedure: PROCTOSCOPY;  Surgeon: Adin Hector, MD;  Location: [facility];  Service: General;  Laterality: N/A;  rigid procto  . RADIOLOGY WITH ANESTHESIA N/A 03/09/2015   Procedure: MRI CERVICAL SPINE WITHOUT AND LUMBER WITHOUT;  Surgeon: Medication Radiologist, MD;  Location: [place];  Service: Radiology;  Laterality: N/A;  . [place]   and implantation of a plate   . STENTED CARDIAC  ARTERY  2007 / 2007 / 2010   X 3 STENT  . THYROID LOBECTOMY  1991   removal of left lobe thyroid  . TOTAL ABDOMINAL HYSTERECTOMY  1973     Social History:  The patient  reports that she quit smoking about 29 years ago. Her smoking use included cigarettes. She has a 40.00 pack-year smoking history. she has never used smokeless tobacco. She reports that she does not drink alcohol or use drugs.   Family History:  The patient's family history includes Cancer in her brother and sister; Diabetes in her brother, brother, father, and sister; Heart disease in her father; Other (age of onset: 6) in her mother; Stroke (age of onset: 71) in her father.    ROS:  Please see the history of present illness. All other systems are reviewed and  Negative to the above problem except as noted.    PHYSICAL EXAM: VS:  BP 132/60   Pulse 86   Ht 5\' 2"  (1.575 m)   Wt 134 lb 6.4 oz (61 kg)   SpO2 97%   BMI 24.58 kg/m   GEN: Well nourished, well developed, in no acute distress  HEENT: normal  Neck:  JVD is normal   No , carotid bruits, or masses Cardiac: RRR;  no murmurs, rubs, or gallops,No signif edema  Respiratory:  clear to auscultation bilaterally, normal work of breathing GI: soft, nontender, nondistended, + BS  No hepatomegaly  MS: no deformity Moving all extremities   Skin: warm and dry, no rash Neuro:  Strength and sensation are intact Psych: euthymic mood, full affect   EKG:  EKG is not ordered today.    Lipid Panel    Component Value Date/Time   CHOL 226 (H) 07/14/2017 0842   TRIG  154 (H) 07/14/2017 0842   HDL 55 07/14/2017 0842   CHOLHDL 4.1 07/14/2017 0842   VLDL 31 07/14/2017 0842   LDLCALC 140 (H) 07/14/2017 0842   LDLDIRECT 127.6 05/15/2013 1422      Wt Readings from Last 3 Encounters:  08/23/17 134 lb 6.4 oz (61 kg)  07/19/17 132 lb (59.9 kg)  07/16/17 129 lb 14.4 oz (58.9 kg)      ASSESSMENT AND PLAN:  1 CAD  Question if dyspnea and giving out is anginal equivalent  Would set up for myovue to r/o large areas of ischemia     2  HTN    BP is OK  Follow    3  HL  Intolerant to statins in past  Diet    4  BOOP  Follows in pulmonary  Moving air OK today    F/U will deprend on test results    Current medicines are reviewed at length with the patient today.  The patient does not have concerns regarding medicines.  Signed, Dorris Carnes, MD  08/23/2017 4:11 PM    [facility] [address], [place], [place]  [address] Phone: [phone]; Fax: [phone]

## 2017-08-23 NOTE — Patient Instructions (Signed)
Medication Instructions:  Your physician recommends that you continue on your current medications as directed. Please refer to the Current Medication list given to you today.   Labwork: none  Testing/Procedures: Your physician has requested that you have a lexiscan myoview. For further information please visit [email or web address]. Please follow instruction sheet, as given.   Follow-Up: Follow up with your physician will depend on test results.   Any Other Special Instructions Will Be Listed Below (If Applicable).     If you need a refill on your cardiac medications before your next appointment, please call your pharmacy.

## 2017-08-27 DIAGNOSIS — K5730 Diverticulosis of large intestine without perforation or abscess without bleeding: Secondary | ICD-10-CM | POA: Diagnosis not present

## 2017-08-27 DIAGNOSIS — I2510 Atherosclerotic heart disease of native coronary artery without angina pectoris: Secondary | ICD-10-CM | POA: Diagnosis not present

## 2017-08-27 DIAGNOSIS — M150 Primary generalized (osteo)arthritis: Secondary | ICD-10-CM | POA: Diagnosis not present

## 2017-08-27 DIAGNOSIS — E782 Mixed hyperlipidemia: Secondary | ICD-10-CM | POA: Diagnosis not present

## 2017-08-27 DIAGNOSIS — J4520 Mild intermittent asthma, uncomplicated: Secondary | ICD-10-CM | POA: Diagnosis not present

## 2017-08-27 DIAGNOSIS — Z0000 Encounter for general adult medical examination without abnormal findings: Secondary | ICD-10-CM | POA: Diagnosis not present

## 2017-08-27 DIAGNOSIS — Z23 Encounter for immunization: Secondary | ICD-10-CM | POA: Diagnosis not present

## 2017-08-27 DIAGNOSIS — I10 Essential (primary) hypertension: Secondary | ICD-10-CM | POA: Diagnosis not present

## 2017-08-27 DIAGNOSIS — N390 Urinary tract infection, site not specified: Secondary | ICD-10-CM | POA: Diagnosis not present

## 2017-08-27 DIAGNOSIS — E039 Hypothyroidism, unspecified: Secondary | ICD-10-CM | POA: Diagnosis not present

## 2017-08-27 DIAGNOSIS — E1065 Type 1 diabetes mellitus with hyperglycemia: Secondary | ICD-10-CM | POA: Diagnosis not present

## 2017-08-27 DIAGNOSIS — E871 Hypo-osmolality and hyponatremia: Secondary | ICD-10-CM | POA: Diagnosis not present

## 2017-08-27 DIAGNOSIS — J449 Chronic obstructive pulmonary disease, unspecified: Secondary | ICD-10-CM | POA: Diagnosis not present

## 2017-08-27 DIAGNOSIS — M349 Systemic sclerosis, unspecified: Secondary | ICD-10-CM | POA: Diagnosis not present

## 2017-09-04 ENCOUNTER — Ambulatory Visit: Payer: Medicare Other | Admitting: *Deleted

## 2017-09-05 ENCOUNTER — Telehealth (HOSPITAL_COMMUNITY): Payer: Self-pay | Admitting: *Deleted

## 2017-09-05 NOTE — Telephone Encounter (Signed)
Patient given detailed instructions per Myocardial Perfusion Study Information Sheet for the test on 09/10/17. Patient notified to arrive 15 minutes early and that it is imperative to arrive on time for appointment to keep from having the test rescheduled.  If you need to cancel or reschedule your appointment, please call the office within 24 hours of your appointment. . Patient verbalized understanding. Kirstie Peri

## 2017-09-10 DIAGNOSIS — M5412 Radiculopathy, cervical region: Secondary | ICD-10-CM | POA: Diagnosis not present

## 2017-09-10 DIAGNOSIS — M545 Low back pain: Secondary | ICD-10-CM | POA: Diagnosis not present

## 2017-09-10 DIAGNOSIS — M7061 Trochanteric bursitis, right hip: Secondary | ICD-10-CM | POA: Diagnosis not present

## 2017-09-10 DIAGNOSIS — Z6826 Body mass index (BMI) 26.0-26.9, adult: Secondary | ICD-10-CM | POA: Diagnosis not present

## 2017-09-10 DIAGNOSIS — M542 Cervicalgia: Secondary | ICD-10-CM | POA: Diagnosis not present

## 2017-09-10 DIAGNOSIS — I10 Essential (primary) hypertension: Secondary | ICD-10-CM | POA: Diagnosis not present

## 2017-09-11 ENCOUNTER — Ambulatory Visit (HOSPITAL_COMMUNITY): Payer: Medicare Other | Attending: Cardiovascular Disease

## 2017-09-11 DIAGNOSIS — R5383 Other fatigue: Secondary | ICD-10-CM | POA: Diagnosis present

## 2017-09-11 DIAGNOSIS — R0602 Shortness of breath: Secondary | ICD-10-CM | POA: Diagnosis not present

## 2017-09-11 LAB — MYOCARDIAL PERFUSION IMAGING
LV dias vol: 48 mL (ref 46–106)
LV sys vol: 17 mL
Peak HR: 76 {beats}/min
RATE: 0.28
Rest HR: 66 {beats}/min
SDS: 2
SRS: 1
SSS: 3
TID: 0.89

## 2017-09-11 MED ORDER — TECHNETIUM TC 99M TETROFOSMIN IV KIT
10.1000 | PACK | Freq: Once | INTRAVENOUS | Status: AC | PRN
  Administered 2017-09-11: 10.1 via INTRAVENOUS
  Filled 2017-09-11: qty 11

## 2017-09-11 MED ORDER — REGADENOSON 0.4 MG/5ML IV SOLN
0.4000 mg | Freq: Once | INTRAVENOUS | Status: AC
  Administered 2017-09-11: 0.4 mg via INTRAVENOUS

## 2017-09-11 MED ORDER — TECHNETIUM TC 99M TETROFOSMIN IV KIT
32.8000 | PACK | Freq: Once | INTRAVENOUS | Status: AC | PRN
  Administered 2017-09-11: 32.8 via INTRAVENOUS
  Filled 2017-09-11: qty 33

## 2017-09-13 ENCOUNTER — Other Ambulatory Visit: Payer: Self-pay | Admitting: *Deleted

## 2017-09-13 ENCOUNTER — Other Ambulatory Visit: Payer: Self-pay | Admitting: Acute Care

## 2017-09-13 DIAGNOSIS — R7989 Other specified abnormal findings of blood chemistry: Secondary | ICD-10-CM

## 2017-09-13 DIAGNOSIS — R5383 Other fatigue: Secondary | ICD-10-CM

## 2017-09-13 NOTE — Telephone Encounter (Signed)
To refill 5 mg per day  to get her through 3/25/when she has her appointment with Dr. Halford Chessman.

## 2017-09-13 NOTE — Telephone Encounter (Signed)
Patient is requesting refill of Prednisone 5mg  PO daily. I noticed in last OV note patient was to take 10mg  daily until return.   Eric Form, NP please advise on 5mg  vs. 10mg  please. (Patient has next appointment on 09/17/17 with Dr. Halford Chessman)  Routing to Jonelle Sidle and Judson Roch

## 2017-09-13 NOTE — Progress Notes (Signed)
Notes recorded by Fay Records, MD on 09/12/2017 at 5:02 PM EDT Labs from primary MD CBC normal She is not anemic TSH is a little abnormal  Would need to check free T3, Free T4 to see if hypothyroid

## 2017-09-17 ENCOUNTER — Ambulatory Visit (INDEPENDENT_AMBULATORY_CARE_PROVIDER_SITE_OTHER): Payer: Medicare Other | Admitting: Pulmonary Disease

## 2017-09-17 ENCOUNTER — Encounter: Payer: Self-pay | Admitting: Pulmonary Disease

## 2017-09-17 ENCOUNTER — Other Ambulatory Visit: Payer: Medicare Other

## 2017-09-17 VITALS — BP 132/58 | HR 80 | Ht 62.0 in | Wt 134.6 lb

## 2017-09-17 DIAGNOSIS — J8489 Other specified interstitial pulmonary diseases: Secondary | ICD-10-CM | POA: Diagnosis not present

## 2017-09-17 DIAGNOSIS — I2510 Atherosclerotic heart disease of native coronary artery without angina pectoris: Secondary | ICD-10-CM | POA: Diagnosis not present

## 2017-09-17 DIAGNOSIS — R5383 Other fatigue: Secondary | ICD-10-CM

## 2017-09-17 DIAGNOSIS — J438 Other emphysema: Secondary | ICD-10-CM

## 2017-09-17 DIAGNOSIS — M349 Systemic sclerosis, unspecified: Secondary | ICD-10-CM | POA: Diagnosis not present

## 2017-09-17 DIAGNOSIS — R7989 Other specified abnormal findings of blood chemistry: Secondary | ICD-10-CM

## 2017-09-17 NOTE — Progress Notes (Signed)
[facility]  Chief Complaint  Patient presents with  . Follow-up    BOOP     Vital signs: BP (!) 132/58 (BP Location: Left Arm, Cuff Size: Normal)   Pulse 80   Ht 5\' 2"  (1.575 m)   Wt 134 lb 9.6 oz (61.1 kg)   SpO2 99%   BMI 24.62 kg/m   History of Present Illness: [name] is a 84 y.o. 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  She had pneumonia in January 2019.  She then had a heart attack.    She still gets winded and fatigued with strenuous exertion.  She does okay with routine activity.  She is not having cough, wheeze, or sputum.  She feels her scleroderma has progressed.  She has trouble getting to her rheumatologists office and wants to get a referral to someone closer.  She is back to 5 mg prednisone daily.    Physical Exam:  General - pleasant Eyes - pupils reactive ENT - no sinus tenderness, no oral exudate, no LAN Cardiac - regular, no murmur Chest - no wheeze, rales Abd - soft, non tender Ext - no edema Skin - no rashes Neuro - normal strength Psych - normal mood  Assessment/Plan:  BOOP in setting of connective tissue disease. - continue 5 mg prednisone daily  Recurrent sinus infections with acute sinusitis. - stable at present - she has been seen by Dr. Lucia Gaskins with ENT previously  Hx of tobacco abuse with emphysema. - she has not noticed benefit from previous trials of inhaler therapy - monitor clinically  Hx of connective tissue disease. - will arrange for referral to Dr. Estanislado Pandy   Patient Instructions  Will arrange for referral to Dr. Estanislado Pandy with rheumatology  Follow up in 6 months    Chesley Mires, MD [place] 09/17/2017, 3:15 PM Pager:  [phone]  Flow Sheet  Pulmonary tests: August 2010 >> Bronchoscopy PFT 06/16/09 >> FEV1 1.99(118%), FEV1% 73, TLC 4.60(105%),  DLCO 66%, no BD CT chest 11/24/11 >> centrilobular emphysema, RUL ASD CT sinus 07/30/13 >> opacification of Lt maxillary sinus, rightward NSD CT sinus 10/05/15 >> b/l mucosal edema paranasal sinuses, obstruction of ostium Rt maxillary sinus CT angio chest 07/09/17 >> GGO RUL  Cardiac tests: Echo 07/10/17 >> mod LVH, EF 65 to 70%, grade 1 DD, PAS 41 mmHg  Past Medical History: She  has a past medical history of Adenomatous colon polyp, Arthritis, Blood transfusion, Cancer ([place]), Cervical disc disease, Chronic sinusitis, CONSTIPATION, CHRONIC (06/26/2007), COPD (chronic obstructive pulmonary disease) ([place]), Coronary artery disease, CVA (cerebral infarction) (1991), Depression, Diabetes mellitus, Diverticulosis, 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  Past Surgical History: She  has a past surgical history that includes Total abdominal hysterectomy (1973); Bilateral oophorectomy (2002); Spinal fusion (1997); lobe thyroid removal; Thyroid lobectomy (1991); Cholecystectomy (1965); Appendectomy; Cataract extraction; Bronchoscopy (02-22-09); STENTED CARDIAC  ARTERY (2007 / 2007 / 2010); Proctoscopy (03/18/2012); Colon surgery (2014); Esophagogastroduodenoscopy (egd) with esophageal dilation (N/A, 06/18/2013); Cardiac catheterization; colonectomy; Radiology with anesthesia (N/A, 03/09/2015); Flexible sigmoidoscopy (N/A, 07/15/2015); and Balloon dilation (N/A, 07/15/2015).  Family History: Her family history includes Cancer in her brother and sister; Diabetes in  her brother, brother, father, and sister; Heart disease in her father;  Other (age of onset: 23) in her mother; Stroke (age of onset: 29) in her father.  Social History: She  reports that she quit smoking about 29 years ago. Her smoking use included cigarettes. She has a 40.00 pack-year smoking history. She has never used smokeless tobacco. She reports that she does not drink alcohol or use drugs.  Medications: Allergies as of 09/17/2017      Reactions   Sulfa Antibiotics Other (See Comments)   Other reaction(s): Other (See Comments) Other Reaction: anaphalaxsis Other Reaction: anaphalaxsis   Lantus [insulin Glargine]    Patient states "sulfate binder causes sinus infection/pneumonia".   Levaquin [levofloxacin In D5w] Other (See Comments)   Muscle aches   Maxidex [dexamethasone] Other (See Comments)   Insomnia, anxiety, dizziness   Metoprolol-hydrochlorothiazide Other (See Comments)    decreased bp   Norvasc [amlodipine] Other (See Comments)   BIL Ankle edema   Novolog [insulin Aspart]    Patient states "sulfate binder causes sinus infection/pneumonia". Tolerates Humalog.   Peanut Oil    Other reaction(s): Rash And throat tightening   Penciclovir    Other reaction(s): Muscle Pain   Senna [sennosides] Other (See Comments)   Pt states that it causes a stinging sensation   Sulfonamide Derivatives Hives   Titanium Other (See Comments)   Neck wouldn't heal with titanium cervical plate   Yellow Dye    Other reaction(s): Bee stings all over body when eating/taking medications with red or yellow dye    Adhesive [tape] Hives, Rash      Medication List        Accurate as of 09/17/17  3:15 PM. Always use your most recent med list.          aspirin EC 81 MG tablet Take 1 tablet (81 mg total) by mouth daily.   cetirizine 10 MG tablet Commonly known as:  ZYRTEC Take 10 mg by mouth daily after breakfast.   esomeprazole 40 MG capsule Commonly known as:  NEXIUM Take 40 mg by mouth daily.   fluticasone 50 MCG/ACT nasal spray Commonly known as:   FLONASE Place 1 spray into both nostrils daily.   guaiFENesin 600 MG 12 hr tablet Commonly known as:  MUCINEX Take 600 mg by mouth daily as needed for to loosen phlegm.   HUMALOG KWIKPEN 100 UNIT/ML KiwkPen Generic drug:  insulin lispro Inject 5-6 Units into the skin 3 (three) times daily with meals. Inject 6 units subcutaneously with breakfast and lunch, inject 5 units with supper   HYDROcodone-acetaminophen 7.5-325 MG tablet Commonly known as:  NORCO Take 1 tablet by mouth every 6 (six) hours as needed for moderate pain.   imipramine 25 MG tablet Commonly known as:  TOFRANIL Take 75 mg by mouth at bedtime.   levothyroxine 100 MCG tablet Commonly known as:  SYNTHROID, LEVOTHROID Take 100 mcg by mouth daily before breakfast.   montelukast 10 MG tablet Commonly known as:  SINGULAIR Take 10 mg by mouth at bedtime.   predniSONE 5 MG tablet Commonly known as:  DELTASONE TAKE 1 TABLET EVERY DAY WITH BREAKFAST.   triazolam 0.25 MG tablet Commonly known as:  HALCION Take 0.25 mg by mouth at bedtime as needed for sleep. For sleep

## 2017-09-17 NOTE — Patient Instructions (Signed)
Will arrange for referral to Dr. Estanislado Pandy with rheumatology  Follow up in 6 months

## 2017-09-18 ENCOUNTER — Encounter: Payer: Medicare Other | Attending: Endocrinology | Admitting: *Deleted

## 2017-09-18 DIAGNOSIS — E109 Type 1 diabetes mellitus without complications: Secondary | ICD-10-CM | POA: Insufficient documentation

## 2017-09-18 DIAGNOSIS — E119 Type 2 diabetes mellitus without complications: Secondary | ICD-10-CM

## 2017-09-18 DIAGNOSIS — Z713 Dietary counseling and surveillance: Secondary | ICD-10-CM | POA: Insufficient documentation

## 2017-09-18 DIAGNOSIS — Z794 Long term (current) use of insulin: Secondary | ICD-10-CM

## 2017-09-20 NOTE — Progress Notes (Signed)
Diabetes Self-Management Education  Visit Type: First/Initial  Appt. Start Time: 1400 Appt. End Time: 4010  09/20/2017  [name], identified by name and date of birth, is a 84 y.o. female with a diagnosis of Diabetes: Type 2. She lives alone, her daughter lives nearby and they eat their meals together often. Patient is legally blind, but has been to [place] in [place] where she learned to cook by timing the cooking times. She has multiple allergies including Sulfa so she cannot take many diabetes medications. She is fairly active with her stationary recumbent bike, gardening in her yard and cleaning her own home. She used to love to cook but that is limited with her vision loss and high risk of burning herself.   ASSESSMENT  There were no vitals taken for this visit. There is no height or weight on file to calculate BMI.  Diabetes Self-Management Education - 09/18/17 1416      Visit Information   Visit Type  First/Initial      Initial Visit   Diabetes Type  Type 2    Are you currently following a meal plan?  Yes    Are you taking your medications as prescribed?  No    Date Diagnosed  2006      Health Coping   How would you rate your overall health?  Good legally blind      Psychosocial Assessment   Self-care barriers  Impaired vision    Self-management support  Family    Other persons present  Patient    Patient Concerns  Nutrition/Meal planning;Glycemic Control;Medication    Special Needs  Verbal instruction    Learning Readiness  Change in progress    How often do you need to have someone help you when you read instructions, pamphlets, or other written materials from your doctor or pharmacy?  5 - Always    What is the last grade level you completed in school?  12      Pre-Education Assessment   Patient understands the diabetes disease and treatment process.  Needs Review    Patient understands incorporating nutritional management into lifestyle.  Needs Review     Patient undertands incorporating physical activity into lifestyle.  Needs Review    Patient understands using medications safely.  Needs Review    Patient understands monitoring blood glucose, interpreting and using results  Needs Review    Patient understands prevention, detection, and treatment of acute complications.  Needs Review    Patient understands prevention, detection, and treatment of chronic complications.  Needs Review    Patient understands how to develop strategies to address psychosocial issues.  Needs Review    Patient understands how to develop strategies to promote health/change behavior.  Needs Review      Complications   Last HgB A1C per patient/outside source  7.5 %    How often do you check your blood sugar?  > 4 times/day Has Libre CGM    Have you had a dilated eye exam in the past 12 months?  Yes    Have you had a dental exam in the past 12 months?  Yes    Are you checking your feet?  Yes    How many days per week are you checking your feet?  7      Dietary Intake   Breakfast  protein bar    Snack (morning)  not usually    Lunch  cottage cheese and pears OR Meals on Wheels OR  grilled cheese sandwich OR     Snack (afternoon)  if running errands, brings a snack    Dinner  protein drink made by [employer], chocolate    Beverage(s)  coffee      Exercise   Exercise Type  Light (walking / raking leaves) cleans her own house, has recumbant bike, does own gardening,       Patient Education   Previous Diabetes Education  No    Disease state   Factors that contribute to the development of diabetes    Nutrition management   Role of diet in the treatment of diabetes and the relationship between the three main macronutrients and blood glucose level;Carbohydrate counting;Meal timing in regards to the patients' current diabetes medication.    Physical activity and exercise   Role of exercise on diabetes management, blood pressure control and cardiac health.    Medications   Reviewed patients medication for diabetes, action, purpose, timing of dose and side effects.    Monitoring  Identified appropriate SMBG and/or A1C goals.    Acute complications  Taught treatment of hypoglycemia - the 15 rule.      Individualized Goals (developed by patient)   Nutrition  Follow meal plan discussed    Physical Activity  Exercise 3-5 times per week    Medications  take my medication as prescribed    Monitoring   test blood glucose pre and post meals as discussed      Post-Education Assessment   Patient understands the diabetes disease and treatment process.  Demonstrates understanding / competency    Patient understands incorporating nutritional management into lifestyle.  Demonstrates understanding / competency    Patient undertands incorporating physical activity into lifestyle.  Demonstrates understanding / competency    Patient understands using medications safely.  Demonstrates understanding / competency    Patient understands monitoring blood glucose, interpreting and using results  Demonstrates understanding / competency    Patient understands prevention, detection, and treatment of acute complications.  Demonstrates understanding / competency    Patient understands prevention, detection, and treatment of chronic complications.  Demonstrates understanding / competency    Patient understands how to develop strategies to address psychosocial issues.  Demonstrates understanding / competency    Patient understands how to develop strategies to promote health/change behavior.  Demonstrates understanding / competency      Outcomes   Expected Outcomes  Demonstrated interest in learning. Expect positive outcomes    Future DMSE  PRN    Program Status  Completed      Individualized Plan for Diabetes Self-Management Training:   Learning Objective:  Patient will have a greater understanding of diabetes self-management. Patient education plan is to attend individual and/or group  sessions per assessed needs and concerns.   Plan:   Patient Instructions  Plan:  Aim for 2-3 Carb Choices per meal (30-45 grams)   Aim for 0-2 Carbs per snack if hungry  Include protein in moderation with your meals and snacks Continue with your activity level by gardening and using your recumbent bike daily as tolerated Continue checking BG at alternate times per day with Elenor Legato CGM   Consider taking medication as directed by MD  Expected Outcomes:  Demonstrated interest in learning. Expect positive outcomes  Education material provided: Meal plan card and Carbohydrate counting sheet I enlarged these to 200% which she was able to read.   If problems or questions, patient to contact team via:  Phone  Future DSME appointment: PRN

## 2017-09-20 NOTE — Patient Instructions (Signed)
Plan:  Aim for 2-3 Carb Choices per meal (30-45 grams)   Aim for 0-2 Carbs per snack if hungry  Include protein in moderation with your meals and snacks Continue with your activity level by gardening and using your recumbent bike daily as tolerated Continue checking BG at alternate times per day with Elenor Legato CGM   Consider taking medication as directed by MD

## 2017-09-27 ENCOUNTER — Other Ambulatory Visit: Payer: Self-pay | Admitting: *Deleted

## 2017-09-27 ENCOUNTER — Other Ambulatory Visit: Payer: Medicare Other | Admitting: *Deleted

## 2017-09-27 DIAGNOSIS — E039 Hypothyroidism, unspecified: Secondary | ICD-10-CM

## 2017-09-27 DIAGNOSIS — R7989 Other specified abnormal findings of blood chemistry: Secondary | ICD-10-CM | POA: Diagnosis not present

## 2017-09-27 LAB — T4, FREE: Free T4: 1.93 ng/dL — ABNORMAL HIGH (ref 0.82–1.77)

## 2017-09-27 LAB — T3, FREE: T3, Free: 2.6 pg/mL (ref 2.0–4.4)

## 2017-10-01 ENCOUNTER — Other Ambulatory Visit: Payer: Self-pay | Admitting: Acute Care

## 2017-10-26 DIAGNOSIS — R319 Hematuria, unspecified: Secondary | ICD-10-CM | POA: Diagnosis not present

## 2017-11-20 DIAGNOSIS — E039 Hypothyroidism, unspecified: Secondary | ICD-10-CM | POA: Diagnosis not present

## 2017-11-20 DIAGNOSIS — I10 Essential (primary) hypertension: Secondary | ICD-10-CM | POA: Diagnosis not present

## 2017-11-20 DIAGNOSIS — E1065 Type 1 diabetes mellitus with hyperglycemia: Secondary | ICD-10-CM | POA: Diagnosis not present

## 2017-11-20 DIAGNOSIS — L0390 Cellulitis, unspecified: Secondary | ICD-10-CM | POA: Diagnosis not present

## 2017-11-28 ENCOUNTER — Encounter: Payer: Self-pay | Admitting: Family Medicine

## 2017-11-28 ENCOUNTER — Ambulatory Visit (INDEPENDENT_AMBULATORY_CARE_PROVIDER_SITE_OTHER): Payer: Medicare Other | Admitting: Family Medicine

## 2017-11-28 VITALS — BP 152/56 | Ht 62.0 in | Wt 129.0 lb

## 2017-11-28 DIAGNOSIS — G8929 Other chronic pain: Secondary | ICD-10-CM

## 2017-11-28 DIAGNOSIS — I2510 Atherosclerotic heart disease of native coronary artery without angina pectoris: Secondary | ICD-10-CM

## 2017-11-28 DIAGNOSIS — H548 Legal blindness, as defined in USA: Secondary | ICD-10-CM | POA: Diagnosis not present

## 2017-11-28 DIAGNOSIS — M7061 Trochanteric bursitis, right hip: Secondary | ICD-10-CM | POA: Diagnosis present

## 2017-11-28 DIAGNOSIS — M25561 Pain in right knee: Secondary | ICD-10-CM

## 2017-11-28 MED ORDER — METHYLPREDNISOLONE ACETATE 40 MG/ML IJ SUSP
40.0000 mg | Freq: Once | INTRAMUSCULAR | Status: AC
  Administered 2017-11-28: 40 mg via INTRA_ARTICULAR

## 2017-11-28 NOTE — Progress Notes (Signed)
Chief complaint: Right lateral hip and right knee pain x2 to 3 months  History of present illness: [name] is an 84 year old female who presents to sports medicine office today with chief complaint of right hip and right knee pain.  She reports that symptoms have been intermittent, either joint can be more bothersome than the other just depending on the day.  She is not report of any specific inciting incident, trauma, or injury to explain the pain.  She reports that symptoms have been present for approximately 2 to 3 months now.  She reports that in her right hip, symptoms are more on the lateral aspect of her right hip.  She reports prolonged standing, walking, or laying on her right side her main aggravating factors.  In regards to her right knee, she feels that pain is more in the inferior aspect of her right knee over the inferior patellar pole.  She does not report of any swelling, warmth, or erythema.  She does not report of any painful popping, locking.  She does report a catching and symptoms of giving way.  She reports that she has not had a fall recently, last fall was about 2 months ago.  She is legally blind, does ambulate with a walking stick.  She was last here in December 2018 for right foot and ankle pain.  She does have known history of tibiotalar osteoarthritic changes.  Dr. Oneida Alar did make her some custom insoles.  She does not have them with her today, but reports of symptom improvement with this.  She does have chronic neck pain, reports that she takes half a tablet of Vicodin daily.  She reports that a few weeks ago given increased pain in her right hip, she did have to take a full tablet of Vicodin.  She has not reported any fevers, chills, night sweats, or any unintentional weight loss.  If she does not lay on her right side, symptoms do not wake her up from sleep at nighttime.  Rest is otherwise only other alleviating factor.  Review of systems:  As stated above She does have  age-related Her past medical history, surgical history, family history, and social history obtained and reviewed.  Her past medical history is notable for hypertension, type 2 diabetes, hyperlipidemia, CAD, COPD, colonic stricture status post laparoscopic sigmoid colectomy, hypothyroidism, depression, legal blindness, systemic sclerosis, fibromyalgia, dysphasia; has extensive surgical history to include: Ectomy, thyroid lobe removal, total abdominal hysterectomy, cholecystectomy, appendectomy, cardiac catheterization, proctoscopy, bronchoscopy, EGD with esophageal dilation; she does not report of any current tobacco use MI; family history notable for type 2 diabetes, lung cancer, heart disease, and stroke; allergies and medications are reviewed and are reflected in EMR.  Physical exam: Vital signs are reviewed and are documented in the chart Gen.: Alert, oriented, appears stated age, in no apparent distress HEENT: Moist oral mucosa Respiratory: Normal respirations, able to speak in full sentences Cardiac: Regular rate, distal pulses 2+ Integumentary: No rashes on visible skin:  Neurologic: Weakness with hip flexion, hip abduction and hip extension on the right side, would categorize strength is 4+/5, she does have intact strength with right knee flexion and right knee extension, sensation intact in bilateral lower extremities Psych: Normal affect, mood is described as good Musculoskeletal: Inspection of her right hip reveals no obvious deformity or muscle atrophy, no warmth, erythema, ecchymosis, or effusion, she is tender palpation over the right greater trochanteric bursal region, she is also tender to palpation over the distal piriformis, gluteus medius,  gluteus minimus insertion over the greater trochanter, she does have great hip internal and external range of motion, no anterior hip pain or tenderness to palpation; inspection of her right knee reveals no obvious deformity or muscle atrophy, no  warmth, erythema, ecchymosis, or notable effusion, no tenderness to palpation anywhere in the right knee to include quadriceps tendon, patellar tendon, medial joint line, lateral joint line, she does report a slight achiness, however, with palpation over the medial and lateral joint line, no signs of ligamentous instability is Lachman, anterior drawer, valgus, varus stress testing negative, McMurray negative, she does walk with a slight antalgic gait favoring the right side, does ambulate with assistance of a walking stick, she does have a chronic wound that appears to be slowly healing over the right lateral ankle over the lateral malleolus  Procedure:  After written informed consent signed and obtained, and benefit of pain relief and risk of bleeding, infection, and steroid flare discussed, [name] agreed to proceed with cortisone injection into the right greater trochanteric bursa . After timeout, area  was cleaned with Betadine and alcohol wipes. Ethyl chloride was used as topical anesthetic spray. Using 5 cc 1% lidocaine without epinephrine and ` cc of 40 mg Depo-Medrol on a 22-gauge 1.5 inch needle, her right greater trochanteric bursa was injected. She had 100% improvement in symptoms after monitoring her for 5 minutes in the office. She did not have any bleeding afterwards. No complications noted from procedure.  Assessment and plan: 1.  Right hip pain, suspect secondary to greater trochanteric pain syndrome 2.  Right anterior knee pain, suspect secondary to aggravation of pre-existing osteoarthritis, with culprit suspected to be related altered gait due to problem #1 3.  History of right tibiotalar osteoarthritis  4.  Type 2 diabetes that is well controlled, most recent A1c reported to be 7% 5.  Legal blindness, requiring walking stick for help with ambulation  Plan: I discussed with [name] today that her symptoms seem to be consistent with right greater trochanteric pain syndrome.  I suspect  that she does have some amount of arthritis in her right knee, but her altered gait from the pain in her right hip is probably leading to pain in her right knee.  I discussed that next best step would be to try cortisone injection to the right greater trochanteric bursal region.  This will hopefully help out with correcting her gait and decreasing pain in her right knee.  She is agreeable to this.  Injection done as noted above without any complications noted.  Discussed to monitor symptoms and if any worsening of pain in her right hip, right knee, right ankle, or anything else, to return to office.  No medications were asked for or given today otherwise.   [name], M.D. [facility]

## 2017-12-06 ENCOUNTER — Encounter (HOSPITAL_BASED_OUTPATIENT_CLINIC_OR_DEPARTMENT_OTHER): Payer: Medicare Other | Attending: Internal Medicine

## 2017-12-06 ENCOUNTER — Encounter (HOSPITAL_BASED_OUTPATIENT_CLINIC_OR_DEPARTMENT_OTHER): Payer: Self-pay

## 2017-12-06 DIAGNOSIS — I2510 Atherosclerotic heart disease of native coronary artery without angina pectoris: Secondary | ICD-10-CM | POA: Diagnosis not present

## 2017-12-06 DIAGNOSIS — L97311 Non-pressure chronic ulcer of right ankle limited to breakdown of skin: Secondary | ICD-10-CM | POA: Insufficient documentation

## 2017-12-06 DIAGNOSIS — G4730 Sleep apnea, unspecified: Secondary | ICD-10-CM | POA: Diagnosis not present

## 2017-12-06 DIAGNOSIS — M069 Rheumatoid arthritis, unspecified: Secondary | ICD-10-CM | POA: Insufficient documentation

## 2017-12-06 DIAGNOSIS — Z794 Long term (current) use of insulin: Secondary | ICD-10-CM | POA: Diagnosis not present

## 2017-12-06 DIAGNOSIS — E11622 Type 2 diabetes mellitus with other skin ulcer: Secondary | ICD-10-CM | POA: Insufficient documentation

## 2017-12-06 DIAGNOSIS — L539 Erythematous condition, unspecified: Secondary | ICD-10-CM | POA: Diagnosis not present

## 2017-12-06 DIAGNOSIS — M25571 Pain in right ankle and joints of right foot: Secondary | ICD-10-CM | POA: Diagnosis not present

## 2017-12-06 DIAGNOSIS — J449 Chronic obstructive pulmonary disease, unspecified: Secondary | ICD-10-CM | POA: Diagnosis not present

## 2017-12-06 DIAGNOSIS — I252 Old myocardial infarction: Secondary | ICD-10-CM | POA: Insufficient documentation

## 2017-12-06 DIAGNOSIS — I7300 Raynaud's syndrome without gangrene: Secondary | ICD-10-CM | POA: Insufficient documentation

## 2017-12-06 DIAGNOSIS — H548 Legal blindness, as defined in USA: Secondary | ICD-10-CM | POA: Diagnosis not present

## 2017-12-13 DIAGNOSIS — L97311 Non-pressure chronic ulcer of right ankle limited to breakdown of skin: Secondary | ICD-10-CM | POA: Diagnosis not present

## 2017-12-13 DIAGNOSIS — E11622 Type 2 diabetes mellitus with other skin ulcer: Secondary | ICD-10-CM | POA: Diagnosis not present

## 2017-12-13 DIAGNOSIS — I7300 Raynaud's syndrome without gangrene: Secondary | ICD-10-CM | POA: Diagnosis not present

## 2017-12-13 DIAGNOSIS — S91011A Laceration without foreign body, right ankle, initial encounter: Secondary | ICD-10-CM | POA: Diagnosis not present

## 2017-12-13 DIAGNOSIS — J449 Chronic obstructive pulmonary disease, unspecified: Secondary | ICD-10-CM | POA: Diagnosis not present

## 2017-12-13 DIAGNOSIS — G4730 Sleep apnea, unspecified: Secondary | ICD-10-CM | POA: Diagnosis not present

## 2017-12-13 DIAGNOSIS — I2510 Atherosclerotic heart disease of native coronary artery without angina pectoris: Secondary | ICD-10-CM | POA: Diagnosis not present

## 2017-12-17 ENCOUNTER — Other Ambulatory Visit (HOSPITAL_BASED_OUTPATIENT_CLINIC_OR_DEPARTMENT_OTHER): Payer: Self-pay | Admitting: Internal Medicine

## 2017-12-17 ENCOUNTER — Ambulatory Visit (HOSPITAL_COMMUNITY)
Admission: RE | Admit: 2017-12-17 | Discharge: 2017-12-17 | Disposition: A | Discharge status: Home / Self Care | Payer: Medicare Other | Source: Ambulatory Visit | Attending: Internal Medicine | Admitting: Internal Medicine

## 2017-12-17 DIAGNOSIS — L97319 Non-pressure chronic ulcer of right ankle with unspecified severity: Secondary | ICD-10-CM | POA: Diagnosis not present

## 2017-12-17 DIAGNOSIS — M86171 Other acute osteomyelitis, right ankle and foot: Secondary | ICD-10-CM | POA: Insufficient documentation

## 2017-12-18 DIAGNOSIS — Z7982 Long term (current) use of aspirin: Secondary | ICD-10-CM | POA: Diagnosis not present

## 2017-12-18 DIAGNOSIS — L97311 Non-pressure chronic ulcer of right ankle limited to breakdown of skin: Secondary | ICD-10-CM | POA: Diagnosis not present

## 2017-12-18 DIAGNOSIS — Z9181 History of falling: Secondary | ICD-10-CM | POA: Diagnosis not present

## 2017-12-18 DIAGNOSIS — Z79891 Long term (current) use of opiate analgesic: Secondary | ICD-10-CM | POA: Diagnosis not present

## 2017-12-18 DIAGNOSIS — E11622 Type 2 diabetes mellitus with other skin ulcer: Secondary | ICD-10-CM | POA: Diagnosis not present

## 2017-12-18 DIAGNOSIS — Z7952 Long term (current) use of systemic steroids: Secondary | ICD-10-CM | POA: Diagnosis not present

## 2017-12-18 DIAGNOSIS — H548 Legal blindness, as defined in USA: Secondary | ICD-10-CM | POA: Diagnosis not present

## 2017-12-18 DIAGNOSIS — M797 Fibromyalgia: Secondary | ICD-10-CM | POA: Diagnosis not present

## 2017-12-18 DIAGNOSIS — E785 Hyperlipidemia, unspecified: Secondary | ICD-10-CM | POA: Diagnosis not present

## 2017-12-18 DIAGNOSIS — Z794 Long term (current) use of insulin: Secondary | ICD-10-CM | POA: Diagnosis not present

## 2017-12-18 DIAGNOSIS — E039 Hypothyroidism, unspecified: Secondary | ICD-10-CM | POA: Diagnosis not present

## 2017-12-18 DIAGNOSIS — Z87891 Personal history of nicotine dependence: Secondary | ICD-10-CM | POA: Diagnosis not present

## 2017-12-18 DIAGNOSIS — I2510 Atherosclerotic heart disease of native coronary artery without angina pectoris: Secondary | ICD-10-CM | POA: Diagnosis not present

## 2017-12-18 DIAGNOSIS — M349 Systemic sclerosis, unspecified: Secondary | ICD-10-CM | POA: Diagnosis not present

## 2017-12-20 DIAGNOSIS — L97311 Non-pressure chronic ulcer of right ankle limited to breakdown of skin: Secondary | ICD-10-CM | POA: Diagnosis not present

## 2017-12-20 DIAGNOSIS — I7300 Raynaud's syndrome without gangrene: Secondary | ICD-10-CM | POA: Diagnosis not present

## 2017-12-20 DIAGNOSIS — E11622 Type 2 diabetes mellitus with other skin ulcer: Secondary | ICD-10-CM | POA: Diagnosis not present

## 2017-12-20 DIAGNOSIS — J449 Chronic obstructive pulmonary disease, unspecified: Secondary | ICD-10-CM | POA: Diagnosis not present

## 2017-12-20 DIAGNOSIS — I2510 Atherosclerotic heart disease of native coronary artery without angina pectoris: Secondary | ICD-10-CM | POA: Diagnosis not present

## 2017-12-20 DIAGNOSIS — G4730 Sleep apnea, unspecified: Secondary | ICD-10-CM | POA: Diagnosis not present

## 2017-12-25 ENCOUNTER — Encounter (HOSPITAL_BASED_OUTPATIENT_CLINIC_OR_DEPARTMENT_OTHER): Payer: Medicare Other | Attending: Internal Medicine

## 2017-12-25 DIAGNOSIS — I10 Essential (primary) hypertension: Secondary | ICD-10-CM | POA: Diagnosis not present

## 2017-12-25 DIAGNOSIS — J449 Chronic obstructive pulmonary disease, unspecified: Secondary | ICD-10-CM | POA: Diagnosis not present

## 2017-12-25 DIAGNOSIS — M069 Rheumatoid arthritis, unspecified: Secondary | ICD-10-CM | POA: Diagnosis not present

## 2017-12-25 DIAGNOSIS — E119 Type 2 diabetes mellitus without complications: Secondary | ICD-10-CM | POA: Diagnosis not present

## 2017-12-25 DIAGNOSIS — I2510 Atherosclerotic heart disease of native coronary artery without angina pectoris: Secondary | ICD-10-CM | POA: Insufficient documentation

## 2017-12-25 DIAGNOSIS — Z8631 Personal history of diabetic foot ulcer: Secondary | ICD-10-CM | POA: Insufficient documentation

## 2017-12-25 DIAGNOSIS — L97311 Non-pressure chronic ulcer of right ankle limited to breakdown of skin: Secondary | ICD-10-CM | POA: Diagnosis not present

## 2017-12-25 DIAGNOSIS — G4730 Sleep apnea, unspecified: Secondary | ICD-10-CM | POA: Insufficient documentation

## 2017-12-25 DIAGNOSIS — I252 Old myocardial infarction: Secondary | ICD-10-CM | POA: Diagnosis not present

## 2017-12-25 DIAGNOSIS — L539 Erythematous condition, unspecified: Secondary | ICD-10-CM | POA: Diagnosis not present

## 2017-12-25 DIAGNOSIS — I7300 Raynaud's syndrome without gangrene: Secondary | ICD-10-CM | POA: Insufficient documentation

## 2017-12-25 DIAGNOSIS — Z09 Encounter for follow-up examination after completed treatment for conditions other than malignant neoplasm: Secondary | ICD-10-CM | POA: Insufficient documentation

## 2018-01-21 DIAGNOSIS — L821 Other seborrheic keratosis: Secondary | ICD-10-CM | POA: Diagnosis not present

## 2018-01-21 DIAGNOSIS — D485 Neoplasm of uncertain behavior of skin: Secondary | ICD-10-CM | POA: Diagnosis not present

## 2018-02-11 DIAGNOSIS — E039 Hypothyroidism, unspecified: Secondary | ICD-10-CM | POA: Diagnosis not present

## 2018-02-11 DIAGNOSIS — E1065 Type 1 diabetes mellitus with hyperglycemia: Secondary | ICD-10-CM | POA: Diagnosis not present

## 2018-02-11 DIAGNOSIS — I10 Essential (primary) hypertension: Secondary | ICD-10-CM | POA: Diagnosis not present

## 2018-02-14 DIAGNOSIS — M7989 Other specified soft tissue disorders: Secondary | ICD-10-CM | POA: Diagnosis not present

## 2018-02-18 DIAGNOSIS — R300 Dysuria: Secondary | ICD-10-CM | POA: Diagnosis not present

## 2018-02-18 DIAGNOSIS — J449 Chronic obstructive pulmonary disease, unspecified: Secondary | ICD-10-CM | POA: Diagnosis not present

## 2018-02-18 DIAGNOSIS — E782 Mixed hyperlipidemia: Secondary | ICD-10-CM | POA: Diagnosis not present

## 2018-02-18 DIAGNOSIS — E1065 Type 1 diabetes mellitus with hyperglycemia: Secondary | ICD-10-CM | POA: Diagnosis not present

## 2018-02-18 DIAGNOSIS — I2510 Atherosclerotic heart disease of native coronary artery without angina pectoris: Secondary | ICD-10-CM | POA: Diagnosis not present

## 2018-03-19 DIAGNOSIS — E039 Hypothyroidism, unspecified: Secondary | ICD-10-CM | POA: Diagnosis not present

## 2018-03-19 DIAGNOSIS — I10 Essential (primary) hypertension: Secondary | ICD-10-CM | POA: Diagnosis not present

## 2018-03-19 DIAGNOSIS — Z23 Encounter for immunization: Secondary | ICD-10-CM | POA: Diagnosis not present

## 2018-03-19 DIAGNOSIS — E1065 Type 1 diabetes mellitus with hyperglycemia: Secondary | ICD-10-CM | POA: Diagnosis not present

## 2018-03-21 ENCOUNTER — Telehealth: Payer: Self-pay | Admitting: Pulmonary Disease

## 2018-03-21 NOTE — Telephone Encounter (Signed)
Spoke with the pt's daughter  She states that the pt is having a hard time getting her PCP to sign her scat form (stating that she is blind and needs assistance w/ transportation) It is due on Monday 03/25/18  She wants VS to sign it  I advised that he is not here until 03/25/18  She wants to know if TP will sign it since pt is familiar with her TP- is this ok for you to sign if she brings it by the office tomorrow? Please advise thanks!

## 2018-03-22 NOTE — Telephone Encounter (Signed)
Sure of course

## 2018-03-22 NOTE — Telephone Encounter (Signed)
Spoke with pt's daughter, [name]. She is aware that Tammy is willing to sign this form. [name] is going to bring this form by today. Will keep message open to follow up on.

## 2018-03-25 NOTE — Telephone Encounter (Signed)
We have not received this form and I have called terri and left message to call back.

## 2018-03-26 NOTE — Telephone Encounter (Signed)
ATC pt, no answer. Left message for pt to call back.  

## 2018-03-27 NOTE — Telephone Encounter (Signed)
Called and spoke to Brodnax regarding the scat form. [name] brought the form by on Friday and had it signed. Nothing further is needed at this time.

## 2018-04-04 ENCOUNTER — Other Ambulatory Visit (INDEPENDENT_AMBULATORY_CARE_PROVIDER_SITE_OTHER): Payer: Medicare Other

## 2018-04-04 ENCOUNTER — Ambulatory Visit (INDEPENDENT_AMBULATORY_CARE_PROVIDER_SITE_OTHER): Payer: Medicare Other | Admitting: Physician Assistant

## 2018-04-04 ENCOUNTER — Encounter: Payer: Self-pay | Admitting: Physician Assistant

## 2018-04-04 VITALS — BP 108/50 | HR 69 | Ht 62.0 in | Wt 134.0 lb

## 2018-04-04 DIAGNOSIS — R1032 Left lower quadrant pain: Secondary | ICD-10-CM

## 2018-04-04 DIAGNOSIS — K5900 Constipation, unspecified: Secondary | ICD-10-CM

## 2018-04-04 DIAGNOSIS — R509 Fever, unspecified: Secondary | ICD-10-CM

## 2018-04-04 DIAGNOSIS — I2510 Atherosclerotic heart disease of native coronary artery without angina pectoris: Secondary | ICD-10-CM | POA: Diagnosis not present

## 2018-04-04 LAB — CBC WITH DIFFERENTIAL/PLATELET
Basophils Absolute: 0.1 10*3/uL (ref 0.0–0.1)
Basophils Relative: 1.1 % (ref 0.0–3.0)
Eosinophils Absolute: 0.2 10*3/uL (ref 0.0–0.7)
Eosinophils Relative: 1.3 % (ref 0.0–5.0)
HCT: 42.5 % (ref 36.0–46.0)
Hemoglobin: 13.9 g/dL (ref 12.0–15.0)
Lymphocytes Relative: 18 % (ref 12.0–46.0)
Lymphs Abs: 2.4 10*3/uL (ref 0.7–4.0)
MCHC: 32.7 g/dL (ref 30.0–36.0)
MCV: 84.4 fl (ref 78.0–100.0)
Monocytes Absolute: 1.1 10*3/uL — ABNORMAL HIGH (ref 0.1–1.0)
Monocytes Relative: 7.9 % (ref 3.0–12.0)
Neutro Abs: 9.7 10*3/uL — ABNORMAL HIGH (ref 1.4–7.7)
Neutrophils Relative %: 71.7 % (ref 43.0–77.0)
Platelets: 269 10*3/uL (ref 150.0–400.0)
RBC: 5.04 Mil/uL (ref 3.87–5.11)
RDW: 14.7 % (ref 11.5–15.5)
WBC: 13.5 10*3/uL — ABNORMAL HIGH (ref 4.0–10.5)

## 2018-04-04 LAB — COMPREHENSIVE METABOLIC PANEL
ALT: 11 U/L (ref 0–35)
AST: 12 U/L (ref 0–37)
Albumin: 4 g/dL (ref 3.5–5.2)
Alkaline Phosphatase: 56 U/L (ref 39–117)
BUN: 23 mg/dL (ref 6–23)
CO2: 28 mEq/L (ref 19–32)
Calcium: 9.2 mg/dL (ref 8.4–10.5)
Chloride: 97 mEq/L (ref 96–112)
Creatinine, Ser: 0.91 mg/dL (ref 0.40–1.20)
GFR: 62.41 mL/min (ref >60.00)
Glucose, Bld: 205 mg/dL — ABNORMAL HIGH (ref 70–99)
Potassium: 5 mEq/L (ref 3.5–5.1)
Sodium: 134 mEq/L — ABNORMAL LOW (ref 135–145)
Total Bilirubin: 0.3 mg/dL (ref 0.2–1.2)
Total Protein: 7.2 g/dL (ref 6.0–8.3)

## 2018-04-04 NOTE — Progress Notes (Signed)
Chief Complaint: Change in bowel habits  Review of pertinent gastrointestinal problems: 1. Benign Sigmoid stricture: she underwent colonoscopy with Dr. Erskine Emery June 2011. This exam revealed significant stenosis in stricturing in the sigmoid  Colon  Which he presumed was from chronic diverticular disease , adhesions.   Indeed she underwent sigmoid resection 2013 with Dr. Remo Lipps gross , primary anastomosis for chronic stricturing, chronic diverticulitis.  2017 flex sig with Dr. Ardis Hughs for intermittent loose stools that seem to be possibly related to overflow type situation, she was found to have benign appearing narrowing at the colocolonic anastomosis, this was dilated with a inflated balloon to 20 mm, held for 1 minute.  HPI:    [name] is an 84 year old Caucasian female with a past medical history as listed below, known to Dr. Ardis Hughs, who was referred to me by Merrilee Seashore, MD for a complaint of change in bowel habits.      10/11/2015 office visit with Dr. Ardis Hughs for change in bowel habits.  At that time patient felt she was doing better after a flex sig with dilation of a benign-appearing narrowing of the colocolonic anastomosis.  Her constipation is somewhat improved with 3-4 bowel movements a day.  At that time, she had tried fiber supplements but did not like how they tasted.  It was recommended she try a wafer or pill fiber supplement rather than the powder which she did not like the taste of.  She was continued on a stool softener once daily.    Today, patient explains that about 2 months ago she started with a low-grade fever every afternoon and evening and thought she may have developed another sinus infection because "I always have them".  Went on two rounds of antibiotics for this and it did not help.  Then developed shooting pains in her abdomen "like when I had diverticulitis years ago" and became severely constipated.  On 03/27/2018 she had an episode of trying to have a  bowel movement on the toilet and tells me that "I was not straining because of my heart" and felt very faint and nauseous and had to get off of the toilet and lay down.  Eventually used a suppository and did "get that piece of bowel movement out", but it did not feel like a good one.  She used Dulcolax that night and again yesterday and has continued to have small amounts of stool.  Her abdominal pains have gotten some better since passing stool.    Denies weight loss, anorexia, vomiting, heartburn, reflux or symptoms that awaken her from sleep.  Past Medical History:  Diagnosis Date  . Adenomatous colon polyp   . Arthritis   . Blood transfusion   . Cancer [facility])    cervical cancer pre hysterectomy  . Cervical disc disease   . Chronic sinusitis   . CONSTIPATION, CHRONIC 06/26/2007   Qualifier: Diagnosis of  By: Nils Pyle CMA ([place]), Mearl Latin    . COPD (chronic obstructive pulmonary disease) ([place])   . Coronary artery disease   . CVA (cerebral infarction) 1991  . Depression   . Diabetes mellitus   . Diverticulosis   . 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 Dyslipidemia   . Emphysema   . Fibromyalgia   . GERD (gastroesophageal reflux disease)   . Hypertension   . Hypothyroidism   . IBS (irritable bowel syndrome)   . Legally blind   . Macular degeneration   . Myocardial infarction [facility]) may 27th 2007  . Pneumonia  07/16/2013    HX PNEUMONIA  . Pulmonary hypertension ([place])   . Raynaud's phenomenon   . Scleroderma ([place])   . Sjogren's disease ([place])   . Sleep apnea    STOP BANG SCORE 5  . Stroke [facility]) 1990   brain stem stroke - weakness rt hand  . Zenker's diverticulum     Past Surgical History:  Procedure Laterality Date  . APPENDECTOMY    . BALLOON DILATION N/A 07/15/2015   Procedure: BALLOON DILATION;  Surgeon: Milus Banister, MD;  Location: [facility];  Service: Endoscopy;  Laterality: N/A;  . BILATERAL OOPHORECTOMY  2002  . BRONCHOSCOPY  02-22-09  . CARDIAC CATHETERIZATION     x 3    . CATARACT EXTRACTION    . CHOLECYSTECTOMY  1965  . COLON SURGERY  2014   colon resection  . colonectomy    . ESOPHAGOGASTRODUODENOSCOPY (EGD) WITH ESOPHAGEAL DILATION N/A 06/18/2013   Procedure: ESOPHAGOGASTRODUODENOSCOPY (EGD) WITH ESOPHAGEAL DILATION;  Surgeon: Inda Castle, MD;  Location: [facility];  Service: Endoscopy;  Laterality: N/A;  . FLEXIBLE SIGMOIDOSCOPY N/A 07/15/2015   Procedure: FLEXIBLE SIGMOIDOSCOPY;  Surgeon: Milus Banister, MD;  Location: [facility];  Service: Endoscopy;  Laterality: N/A;  . lobe thyroid removal    . PROCTOSCOPY  03/18/2012   Procedure: PROCTOSCOPY;  Surgeon: Adin Hector, MD;  Location: [facility];  Service: General;  Laterality: N/A;  rigid procto  . RADIOLOGY WITH ANESTHESIA N/A 03/09/2015   Procedure: MRI CERVICAL SPINE WITHOUT AND LUMBER WITHOUT;  Surgeon: Medication Radiologist, MD;  Location: [facility];  Service: Radiology;  Laterality: N/A;  . [place]   and implantation of a plate   . STENTED CARDIAC  ARTERY  2007 / 2007 / 2010   X 3 STENT  . THYROID LOBECTOMY  1991   removal of left lobe thyroid  . TOTAL ABDOMINAL HYSTERECTOMY  1973    Current Outpatient Medications  Medication Sig Dispense Refill  . aspirin EC 81 MG tablet Take 1 tablet (81 mg total) by mouth daily. 90 tablet 3  . cetirizine (ZYRTEC) 10 MG tablet Take 10 mg by mouth daily after breakfast.     . esomeprazole (NEXIUM) 40 MG capsule Take 40 mg by mouth daily.     . fluticasone (FLONASE) 50 MCG/ACT nasal spray Place 1 spray into both nostrils daily. 16 g 0  . guaiFENesin (MUCINEX) 600 MG 12 hr tablet Take 600 mg by mouth daily as needed for to loosen phlegm.     [name] HYDROcodone-acetaminophen (NORCO) 7.5-325 MG tablet Take 1 tablet by mouth every 6 (six) hours as needed for moderate pain.    [name] imipramine (TOFRANIL) 25 MG tablet Take 75 mg by mouth at bedtime.     . insulin lispro (HUMALOG KWIKPEN) 100 UNIT/ML KiwkPen Inject 5-6 Units into the skin 3 (three) times  daily with meals. Inject 6 units subcutaneously with breakfast and lunch, inject 5 units with supper    . levothyroxine (SYNTHROID, LEVOTHROID) 100 MCG tablet Take 100 mcg by mouth daily before breakfast.    . montelukast (SINGULAIR) 10 MG tablet Take 10 mg by mouth at bedtime.     . predniSONE (DELTASONE) 5 MG tablet TAKE 1 TABLET EVERY DAY WITH BREAKFAST. 30 tablet 6  . triazolam (HALCION) 0.25 MG tablet Take 0.25 mg by mouth at bedtime as needed for sleep. For sleep     No current facility-administered medications for this visit.     Allergies as of  04/04/2018 - Review Complete 04/04/2018  Allergen Reaction Noted  . Sulfa antibiotics Other (See Comments) 04/11/2010  . Lantus [insulin glargine]  03/14/2012  . Levaquin [levofloxacin in d5w] Other (See Comments) 11/02/2014  . Maxidex [dexamethasone] Other (See Comments) 01/29/2015  . Metoprolol-hydrochlorothiazide Other (See Comments)   . Norvasc [amlodipine] Other (See Comments) 01/29/2015  . Novolog [insulin aspart]  03/18/2012  . Peanut oil  04/11/2010  . Penciclovir  03/14/2016  . Senna [sennosides] Other (See Comments) 07/09/2017  . Sulfonamide derivatives Hives   . Titanium Other (See Comments) 03/27/2011  . Yellow dye  03/12/2017  . Adhesive [tape] Hives and Rash 03/13/2012    Family History  Problem Relation Age of Onset  . Other Mother 65       Died from sepsis  . Stroke Father 13       died  . Heart disease Father   . Diabetes Father   . Cancer Sister        lung  . Diabetes Sister   . Cancer Brother        lung  . Diabetes Brother   . Diabetes Brother     Social History   Socioeconomic History  . Marital status: Divorced    Spouse name: Not on file  . Number of children: Not on file  . Years of education: Not on file  . Highest education level: Not on file  Occupational History  . Occupation: retired    Fish farm manager: RETIRED  Social Needs  . Financial resource strain: Not on file  . Food insecurity:     Worry: Not on file    Inability: Not on file  . Transportation needs:    Medical: Not on file    Non-medical: Not on file  Tobacco Use  . Smoking status: Former Smoker    Packs/day: 1.00    Years: 40.00    Pack years: 40.00    Types: Cigarettes    Last attempt to quit: 06/26/1988    Years since quitting: 29.7  . Smokeless tobacco: Never Used  . Tobacco comment: 40 pack years  Substance and Sexual Activity  . Alcohol use: No  . Drug use: No  . Sexual activity: Not on file  Lifestyle  . Physical activity:    Days per week: Not on file    Minutes per session: Not on file  . Stress: Not on file  Relationships  . Social connections:    Talks on phone: Patient refused    Gets together: Patient refused    Attends religious service: Patient refused    Active member of club or organization: Patient refused    Attends meetings of clubs or organizations: Patient refused    Relationship status: Patient refused  . Intimate partner violence:    Fear of current or ex partner: Patient refused    Emotionally abused: Patient refused    Physically abused: Patient refused    Forced sexual activity: Patient refused  Other Topics Concern  . Not on file  Social History Narrative  . Not on file    Review of Systems:    Constitutional: No weight loss or fatigue Cardiovascular: No chest pain Respiratory: No SOB  Gastrointestinal: See HPI and otherwise negative   Physical Exam:  Vital signs: BP (!) 108/50   Pulse 69   Ht 5\' 2"  (1.575 m)   Wt 134 lb (60.8 kg)   BMI 24.51 kg/m   Constitutional:   Very Pleasant Elderly Caucasian female appears  to be in NAD, Well developed, Well nourished, alert and cooperative Respiratory: Respirations even and unlabored. Lungs clear to auscultation bilaterally.   No wheezes, crackles, or rhonchi.  Cardiovascular: Normal S1, S2. No MRG. Regular rate and rhythm. No peripheral edema, cyanosis or pallor.  Gastrointestinal:  Soft, nondistended, moderate left  lower quadrant TTP. No rebound or guarding. Decreased BS all four quadrants, No appreciable masses or hepatomegaly. Rectal:  Not performed.  Psychiatric: Demonstrates good judgement and reason without abnormal affect or behaviors.  No recent labs or imaging.  Assessment: 1.  Abdominal pain:  left lower quadrant, some shooting pains better now that she has had a bowel movement, continues with pain on palpation; consider constipation versus diverticulitis versus other 2.  Low-grade fever: In the afternoons, previously on 2 rounds of antibiotics for sinusitis which did not help, now with left lower quadrant pain and constipation; consider diverticulitis versus other 3.  Constipation: Chronic for the patient, some worse recently, history of a colonic stricture at her anastomosis, dilated in 2017  Plan: 1.  Recommend patient start using her Dulcolax once daily as suggested by Dr. Ardis Hughs at time of her last appointment.  Would recommend she use this in the morning. 2.  Recommend patient increase fiber in her diet to at least 25-35 g of fiber per day with use of fiber supplement.  Discussed that she can use a pill and/or wafer instead of the powder she does not like the taste of this.  Provided her with a high-fiber diet handout. 3.  Continue 6-eight 8 ounce glasses of water per day 4.  Ordered a CT of the abdomen pelvis with contrast for further evaluation of left lower quadrant pain and low-grade fevers.   5.  Ordered CBC and CMP 6.  Patient to follow clinic recommendations after imaging and labs above.  Ellouise Newer, PA-C [facility] 04/04/2018, 11:44 AM  Cc: Merrilee Seashore, MD

## 2018-04-04 NOTE — Progress Notes (Signed)
I agree with the above note, plan 

## 2018-04-04 NOTE — Patient Instructions (Signed)
Take Dulcolax every morning.   Try a daily fiber supplement. We have given you a high fiber diet handout. Please strive to have 25-30 grams of fiber daily.    Your provider has requested that you go to the basement level for lab work before leaving today. Press "B" on the elevator. The lab is located at the first door on the left as you exit the elevator.  Tor [address], [place], [place]  [place]: [phone]   You have been scheduled for a CT scan of the abdomen and pelvis at [facility]. They will call you with an appointment, if you have not heard from them by next week please call our office.

## 2018-04-11 ENCOUNTER — Ambulatory Visit
Admission: RE | Admit: 2018-04-11 | Discharge: 2018-04-11 | Disposition: A | Discharge status: Home / Self Care | Payer: Medicare Other | Source: Ambulatory Visit | Attending: Physician Assistant | Admitting: Physician Assistant

## 2018-04-11 ENCOUNTER — Other Ambulatory Visit: Payer: Medicare Other

## 2018-04-11 DIAGNOSIS — K5730 Diverticulosis of large intestine without perforation or abscess without bleeding: Secondary | ICD-10-CM | POA: Diagnosis not present

## 2018-04-11 DIAGNOSIS — R1032 Left lower quadrant pain: Secondary | ICD-10-CM

## 2018-04-11 MED ORDER — IOPAMIDOL (ISOVUE-300) INJECTION 61%
100.0000 mL | Freq: Once | INTRAVENOUS | Status: AC | PRN
  Administered 2018-04-11: 100 mL via INTRAVENOUS

## 2018-05-27 DIAGNOSIS — M542 Cervicalgia: Secondary | ICD-10-CM | POA: Diagnosis not present

## 2018-05-27 DIAGNOSIS — M5412 Radiculopathy, cervical region: Secondary | ICD-10-CM | POA: Diagnosis not present

## 2018-05-27 DIAGNOSIS — Z6825 Body mass index (BMI) 25.0-25.9, adult: Secondary | ICD-10-CM | POA: Diagnosis not present

## 2018-05-27 DIAGNOSIS — R030 Elevated blood-pressure reading, without diagnosis of hypertension: Secondary | ICD-10-CM | POA: Diagnosis not present

## 2018-06-05 ENCOUNTER — Other Ambulatory Visit: Payer: Self-pay | Admitting: Pulmonary Disease

## 2018-06-17 ENCOUNTER — Encounter (HOSPITAL_COMMUNITY): Payer: Self-pay | Admitting: Internal Medicine

## 2018-06-17 ENCOUNTER — Ambulatory Visit (INDEPENDENT_AMBULATORY_CARE_PROVIDER_SITE_OTHER): Payer: Medicare Other | Admitting: Nurse Practitioner

## 2018-06-17 ENCOUNTER — Inpatient Hospital Stay (HOSPITAL_COMMUNITY)
Admission: EM | Admit: 2018-06-17 | Discharge: 2018-06-24 | DRG: 871 | Disposition: A | Discharge status: Home Health | Payer: Medicare Other | Source: Ambulatory Visit | Attending: Internal Medicine | Admitting: Internal Medicine

## 2018-06-17 ENCOUNTER — Other Ambulatory Visit: Payer: Self-pay

## 2018-06-17 ENCOUNTER — Encounter: Payer: Self-pay | Admitting: Nurse Practitioner

## 2018-06-17 ENCOUNTER — Emergency Department (HOSPITAL_COMMUNITY): Payer: Medicare Other

## 2018-06-17 ENCOUNTER — Telehealth: Payer: Self-pay

## 2018-06-17 VITALS — BP 96/40 | HR 87 | Temp 98.8°F | Ht 62.0 in | Wt 129.0 lb

## 2018-06-17 DIAGNOSIS — M349 Systemic sclerosis, unspecified: Secondary | ICD-10-CM | POA: Diagnosis present

## 2018-06-17 DIAGNOSIS — R0602 Shortness of breath: Secondary | ICD-10-CM

## 2018-06-17 DIAGNOSIS — Z8719 Personal history of other diseases of the digestive system: Secondary | ICD-10-CM

## 2018-06-17 DIAGNOSIS — A419 Sepsis, unspecified organism: Principal | ICD-10-CM

## 2018-06-17 DIAGNOSIS — I10 Essential (primary) hypertension: Secondary | ICD-10-CM | POA: Diagnosis present

## 2018-06-17 DIAGNOSIS — R54 Age-related physical debility: Secondary | ICD-10-CM | POA: Diagnosis present

## 2018-06-17 DIAGNOSIS — Z8673 Personal history of transient ischemic attack (TIA), and cerebral infarction without residual deficits: Secondary | ICD-10-CM

## 2018-06-17 DIAGNOSIS — G4730 Sleep apnea, unspecified: Secondary | ICD-10-CM | POA: Diagnosis present

## 2018-06-17 DIAGNOSIS — I272 Pulmonary hypertension, unspecified: Secondary | ICD-10-CM | POA: Diagnosis present

## 2018-06-17 DIAGNOSIS — R5081 Fever presenting with conditions classified elsewhere: Secondary | ICD-10-CM

## 2018-06-17 DIAGNOSIS — Z91018 Allergy to other foods: Secondary | ICD-10-CM

## 2018-06-17 DIAGNOSIS — I959 Hypotension, unspecified: Secondary | ICD-10-CM | POA: Diagnosis not present

## 2018-06-17 DIAGNOSIS — Z888 Allergy status to other drugs, medicaments and biological substances status: Secondary | ICD-10-CM

## 2018-06-17 DIAGNOSIS — R402140 Coma scale, eyes open, spontaneous, unspecified time: Secondary | ICD-10-CM | POA: Diagnosis present

## 2018-06-17 DIAGNOSIS — R402360 Coma scale, best motor response, obeys commands, unspecified time: Secondary | ICD-10-CM | POA: Diagnosis present

## 2018-06-17 DIAGNOSIS — I252 Old myocardial infarction: Secondary | ICD-10-CM

## 2018-06-17 DIAGNOSIS — F329 Major depressive disorder, single episode, unspecified: Secondary | ICD-10-CM | POA: Diagnosis present

## 2018-06-17 DIAGNOSIS — Z87891 Personal history of nicotine dependence: Secondary | ICD-10-CM

## 2018-06-17 DIAGNOSIS — M25512 Pain in left shoulder: Secondary | ICD-10-CM | POA: Diagnosis present

## 2018-06-17 DIAGNOSIS — Z955 Presence of coronary angioplasty implant and graft: Secondary | ICD-10-CM

## 2018-06-17 DIAGNOSIS — Z9049 Acquired absence of other specified parts of digestive tract: Secondary | ICD-10-CM

## 2018-06-17 DIAGNOSIS — J438 Other emphysema: Secondary | ICD-10-CM | POA: Diagnosis present

## 2018-06-17 DIAGNOSIS — E876 Hypokalemia: Secondary | ICD-10-CM | POA: Diagnosis present

## 2018-06-17 DIAGNOSIS — I2510 Atherosclerotic heart disease of native coronary artery without angina pectoris: Secondary | ICD-10-CM | POA: Diagnosis present

## 2018-06-17 DIAGNOSIS — R079 Chest pain, unspecified: Secondary | ICD-10-CM | POA: Diagnosis present

## 2018-06-17 DIAGNOSIS — H548 Legal blindness, as defined in USA: Secondary | ICD-10-CM | POA: Diagnosis present

## 2018-06-17 DIAGNOSIS — K219 Gastro-esophageal reflux disease without esophagitis: Secondary | ICD-10-CM | POA: Diagnosis present

## 2018-06-17 DIAGNOSIS — G4700 Insomnia, unspecified: Secondary | ICD-10-CM | POA: Diagnosis present

## 2018-06-17 DIAGNOSIS — R9431 Abnormal electrocardiogram [ECG] [EKG]: Secondary | ICD-10-CM

## 2018-06-17 DIAGNOSIS — Z86010 Personal history of colonic polyps: Secondary | ICD-10-CM

## 2018-06-17 DIAGNOSIS — R6884 Jaw pain: Secondary | ICD-10-CM | POA: Diagnosis present

## 2018-06-17 DIAGNOSIS — D899 Disorder involving the immune mechanism, unspecified: Secondary | ICD-10-CM | POA: Diagnosis present

## 2018-06-17 DIAGNOSIS — R339 Retention of urine, unspecified: Secondary | ICD-10-CM | POA: Diagnosis present

## 2018-06-17 DIAGNOSIS — M1990 Unspecified osteoarthritis, unspecified site: Secondary | ICD-10-CM | POA: Diagnosis present

## 2018-06-17 DIAGNOSIS — E039 Hypothyroidism, unspecified: Secondary | ICD-10-CM | POA: Diagnosis present

## 2018-06-17 DIAGNOSIS — Z794 Long term (current) use of insulin: Secondary | ICD-10-CM

## 2018-06-17 DIAGNOSIS — Z7982 Long term (current) use of aspirin: Secondary | ICD-10-CM

## 2018-06-17 DIAGNOSIS — Z882 Allergy status to sulfonamides status: Secondary | ICD-10-CM

## 2018-06-17 DIAGNOSIS — Z91048 Other nonmedicinal substance allergy status: Secondary | ICD-10-CM

## 2018-06-17 DIAGNOSIS — Z981 Arthrodesis status: Secondary | ICD-10-CM

## 2018-06-17 DIAGNOSIS — I248 Other forms of acute ischemic heart disease: Secondary | ICD-10-CM | POA: Diagnosis not present

## 2018-06-17 DIAGNOSIS — M3500 Sicca syndrome, unspecified: Secondary | ICD-10-CM | POA: Diagnosis present

## 2018-06-17 DIAGNOSIS — R531 Weakness: Secondary | ICD-10-CM

## 2018-06-17 DIAGNOSIS — M5090 Cervical disc disorder, unspecified, unspecified cervical region: Secondary | ICD-10-CM | POA: Diagnosis present

## 2018-06-17 DIAGNOSIS — E119 Type 2 diabetes mellitus without complications: Secondary | ICD-10-CM

## 2018-06-17 DIAGNOSIS — Z91010 Allergy to peanuts: Secondary | ICD-10-CM

## 2018-06-17 DIAGNOSIS — I7300 Raynaud's syndrome without gangrene: Secondary | ICD-10-CM | POA: Diagnosis present

## 2018-06-17 DIAGNOSIS — J439 Emphysema, unspecified: Secondary | ICD-10-CM | POA: Diagnosis present

## 2018-06-17 DIAGNOSIS — R5383 Other fatigue: Secondary | ICD-10-CM | POA: Diagnosis not present

## 2018-06-17 DIAGNOSIS — R05 Cough: Secondary | ICD-10-CM | POA: Diagnosis not present

## 2018-06-17 DIAGNOSIS — R52 Pain, unspecified: Secondary | ICD-10-CM | POA: Diagnosis not present

## 2018-06-17 DIAGNOSIS — Z9102 Food additives allergy status: Secondary | ICD-10-CM

## 2018-06-17 DIAGNOSIS — M797 Fibromyalgia: Secondary | ICD-10-CM | POA: Diagnosis present

## 2018-06-17 DIAGNOSIS — J189 Pneumonia, unspecified organism: Secondary | ICD-10-CM | POA: Diagnosis not present

## 2018-06-17 DIAGNOSIS — R509 Fever, unspecified: Secondary | ICD-10-CM | POA: Diagnosis not present

## 2018-06-17 DIAGNOSIS — Z8679 Personal history of other diseases of the circulatory system: Secondary | ICD-10-CM

## 2018-06-17 DIAGNOSIS — F32A Depression, unspecified: Secondary | ICD-10-CM | POA: Diagnosis present

## 2018-06-17 DIAGNOSIS — R402250 Coma scale, best verbal response, oriented, unspecified time: Secondary | ICD-10-CM | POA: Diagnosis present

## 2018-06-17 DIAGNOSIS — Z79899 Other long term (current) drug therapy: Secondary | ICD-10-CM

## 2018-06-17 DIAGNOSIS — Z7952 Long term (current) use of systemic steroids: Secondary | ICD-10-CM

## 2018-06-17 LAB — CBC WITH DIFFERENTIAL/PLATELET
Abs Immature Granulocytes: 0.18 10*3/uL — ABNORMAL HIGH (ref 0.00–0.07)
Basophils Absolute: 0.1 10*3/uL (ref 0.0–0.1)
Basophils Relative: 0 %
Eosinophils Absolute: 0.1 10*3/uL (ref 0.0–0.5)
Eosinophils Relative: 0 %
HCT: 38.8 % (ref 36.0–46.0)
Hemoglobin: 12.3 g/dL (ref 12.0–15.0)
Immature Granulocytes: 1 %
Lymphocytes Relative: 8 %
Lymphs Abs: 1.7 10*3/uL (ref 0.7–4.0)
MCH: 27.3 pg (ref 26.0–34.0)
MCHC: 31.7 g/dL (ref 30.0–36.0)
MCV: 86 fL (ref 80.0–100.0)
Monocytes Absolute: 1.3 10*3/uL — ABNORMAL HIGH (ref 0.1–1.0)
Monocytes Relative: 6 %
Neutro Abs: 18.2 10*3/uL — ABNORMAL HIGH (ref 1.7–7.7)
Neutrophils Relative %: 85 %
Platelets: 191 10*3/uL (ref 150–400)
RBC: 4.51 MIL/uL (ref 3.87–5.11)
RDW: 13.4 % (ref 11.5–15.5)
WBC: 21.5 10*3/uL — ABNORMAL HIGH (ref 4.0–10.5)
nRBC: 0 % (ref 0.0–0.2)

## 2018-06-17 LAB — COMPREHENSIVE METABOLIC PANEL
ALT: 15 U/L (ref 0–44)
AST: 20 U/L (ref 15–41)
Albumin: 3.3 g/dL — ABNORMAL LOW (ref 3.5–5.0)
Alkaline Phosphatase: 43 U/L (ref 38–126)
Anion gap: 13 (ref 5–15)
BUN: 25 mg/dL — ABNORMAL HIGH (ref 8–23)
CO2: 25 mmol/L (ref 22–32)
Calcium: 8.7 mg/dL — ABNORMAL LOW (ref 8.9–10.3)
Chloride: 95 mmol/L — ABNORMAL LOW (ref 98–111)
Creatinine, Ser: 1.06 mg/dL — ABNORMAL HIGH (ref 0.44–1.00)
GFR calc Af Amer: 55 mL/min — ABNORMAL LOW (ref >60)
GFR calc non Af Amer: 48 mL/min — ABNORMAL LOW (ref >60)
Glucose, Bld: 184 mg/dL — ABNORMAL HIGH (ref 70–99)
Potassium: 3.3 mmol/L — ABNORMAL LOW (ref 3.5–5.1)
Sodium: 133 mmol/L — ABNORMAL LOW (ref 135–145)
Total Bilirubin: 1 mg/dL (ref 0.3–1.2)
Total Protein: 6.3 g/dL — ABNORMAL LOW (ref 6.5–8.1)

## 2018-06-17 LAB — I-STAT CG4 LACTIC ACID, ED: Lactic Acid, Venous: 1.8 mmol/L (ref 0.5–1.9)

## 2018-06-17 LAB — POCT INFLUENZA A/B

## 2018-06-17 LAB — TROPONIN I: Troponin I: <0.03 ng/mL (ref <0.03)

## 2018-06-17 LAB — CBG MONITORING, ED: Glucose-Capillary: 143 mg/dL — ABNORMAL HIGH (ref 70–99)

## 2018-06-17 LAB — I-STAT TROPONIN, ED: Troponin i, poc: 0.02 ng/mL (ref 0.00–0.08)

## 2018-06-17 LAB — GLUCOSE, CAPILLARY: Glucose-Capillary: 121 mg/dL — ABNORMAL HIGH (ref 70–99)

## 2018-06-17 MED ORDER — ASPIRIN 325 MG PO TABS
325.0000 mg | ORAL_TABLET | Freq: Once | ORAL | Status: AC
  Administered 2018-06-17: 325 mg via ORAL
  Filled 2018-06-17: qty 1

## 2018-06-17 MED ORDER — PANTOPRAZOLE SODIUM 40 MG PO PACK
40.0000 mg | PACK | Freq: Every day | ORAL | Status: DC
  Administered 2018-06-18: 40 mg
  Filled 2018-06-17: qty 20

## 2018-06-17 MED ORDER — SODIUM CHLORIDE 0.9 % IV SOLN
500.0000 mg | Freq: Once | INTRAVENOUS | Status: AC
  Administered 2018-06-17: 500 mg via INTRAVENOUS
  Filled 2018-06-17: qty 500

## 2018-06-17 MED ORDER — SODIUM CHLORIDE 0.9 % IV SOLN
1.0000 g | INTRAVENOUS | Status: DC
  Administered 2018-06-18 – 2018-06-23 (×6): 1 g via INTRAVENOUS
  Filled 2018-06-17 (×8): qty 10

## 2018-06-17 MED ORDER — SODIUM CHLORIDE 0.9 % IV SOLN
500.0000 mg | INTRAVENOUS | Status: DC
  Administered 2018-06-18 – 2018-06-20 (×3): 500 mg via INTRAVENOUS
  Filled 2018-06-17 (×4): qty 500

## 2018-06-17 MED ORDER — SODIUM CHLORIDE 0.9 % IV SOLN
INTRAVENOUS | Status: AC
  Administered 2018-06-17 – 2018-06-18 (×2): via INTRAVENOUS

## 2018-06-17 MED ORDER — TRIAZOLAM 0.125 MG PO TABS
0.2500 mg | ORAL_TABLET | Freq: Every evening | ORAL | Status: DC | PRN
  Administered 2018-06-17 – 2018-06-23 (×7): 0.25 mg via ORAL
  Filled 2018-06-17 (×8): qty 2

## 2018-06-17 MED ORDER — PANTOPRAZOLE SODIUM 40 MG PO TBEC: 40.0000 mg | DELAYED_RELEASE_TABLET | Freq: Every day | ORAL | Status: DC

## 2018-06-17 MED ORDER — SODIUM CHLORIDE 0.9 % IV BOLUS
1000.0000 mL | Freq: Once | INTRAVENOUS | Status: AC
  Administered 2018-06-17: 1000 mL via INTRAVENOUS

## 2018-06-17 MED ORDER — MONTELUKAST SODIUM 10 MG PO TABS
10.0000 mg | ORAL_TABLET | Freq: Every day | ORAL | Status: DC
  Administered 2018-06-17 – 2018-06-23 (×7): 10 mg via ORAL
  Filled 2018-06-17 (×7): qty 1

## 2018-06-17 MED ORDER — PREDNISONE 5 MG PO TABS
5.0000 mg | ORAL_TABLET | Freq: Every day | ORAL | Status: DC
  Administered 2018-06-18 – 2018-06-22 (×5): 5 mg via ORAL
  Filled 2018-06-17 (×5): qty 1

## 2018-06-17 MED ORDER — FLUTICASONE PROPIONATE 50 MCG/ACT NA SUSP
1.0000 | Freq: Every day | NASAL | Status: DC
  Administered 2018-06-18 – 2018-06-24 (×7): 1 via NASAL
  Filled 2018-06-17: qty 16

## 2018-06-17 MED ORDER — ACETAMINOPHEN 325 MG PO TABS
650.0000 mg | ORAL_TABLET | Freq: Four times a day (QID) | ORAL | Status: DC | PRN
  Administered 2018-06-17 – 2018-06-18 (×2): 650 mg via ORAL
  Filled 2018-06-17 (×2): qty 2

## 2018-06-17 MED ORDER — IMIPRAMINE HCL 50 MG PO TABS
75.0000 mg | ORAL_TABLET | Freq: Every day | ORAL | Status: DC
  Administered 2018-06-17 – 2018-06-23 (×7): 75 mg via ORAL
  Filled 2018-06-17 (×7): qty 1

## 2018-06-17 MED ORDER — SODIUM CHLORIDE 0.9 % IV SOLN
1.0000 g | Freq: Once | INTRAVENOUS | Status: AC
  Administered 2018-06-17: 1 g via INTRAVENOUS
  Filled 2018-06-17: qty 10

## 2018-06-17 MED ORDER — ENOXAPARIN SODIUM 30 MG/0.3ML ~~LOC~~ SOLN
30.0000 mg | SUBCUTANEOUS | Status: DC
  Administered 2018-06-17 – 2018-06-18 (×2): 30 mg via SUBCUTANEOUS
  Filled 2018-06-17 (×2): qty 0.3

## 2018-06-17 MED ORDER — INSULIN ASPART 100 UNIT/ML ~~LOC~~ SOLN
0.0000 [IU] | Freq: Three times a day (TID) | SUBCUTANEOUS | Status: DC
  Administered 2018-06-20: 3 [IU] via SUBCUTANEOUS
  Administered 2018-06-20: 1 [IU] via SUBCUTANEOUS
  Administered 2018-06-21: 2 [IU] via SUBCUTANEOUS
  Administered 2018-06-21: 3 [IU] via SUBCUTANEOUS

## 2018-06-17 MED ORDER — LEVOTHYROXINE SODIUM 100 MCG PO TABS
100.0000 ug | ORAL_TABLET | Freq: Every day | ORAL | Status: DC
  Administered 2018-06-18 – 2018-06-24 (×8): 100 ug via ORAL
  Filled 2018-06-17 (×8): qty 1

## 2018-06-17 MED ORDER — ASPIRIN EC 81 MG PO TBEC
81.0000 mg | DELAYED_RELEASE_TABLET | Freq: Every day | ORAL | Status: DC
  Administered 2018-06-18 – 2018-06-24 (×7): 81 mg via ORAL
  Filled 2018-06-17 (×7): qty 1

## 2018-06-17 NOTE — ED Provider Notes (Signed)
[facility]PARTMENT Provider Note   CSN: [id number] Arrival date & time: 06/17/18  1714     History   Chief Complaint Chief Complaint  Patient presents with  . Weakness    HPI [name] is a 84 y.o. female.  Pt presents to the ED today with weakness, fever, low BP, and jaw pain.  Pt said she was fine yesterday, then started feeling badly this morning.  She said she had a fever of 103.4 at home for which she took 2 ASA around 0900.  The pt went to her pulmonologist today who sent her here.  While at the pulmonologist's office, her bp was low and she was c/o jaw pain which she's had with prior MIs.  They did do a flu test which was negative.  The pt denies any cp, no n/v/d.  She did recently finish a course of augmentin for a sinus infection.     Past Medical History:  Diagnosis Date  . Adenomatous colon polyp   . Arthritis   . Blood transfusion   . Cancer [facility])    cervical cancer pre hysterectomy  . Cervical disc disease   . Chronic sinusitis   . CONSTIPATION, CHRONIC 06/26/2007   Qualifier: Diagnosis of  By: Nils Pyle CMA ([place]), Mearl Latin    . COPD (chronic obstructive pulmonary disease) ([place])   . Coronary artery disease   . CVA (cerebral infarction) 1991  . Depression   . Diabetes mellitus   . Diverticulosis   . 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 Dyslipidemia   . Emphysema   . Fibromyalgia   . GERD (gastroesophageal reflux disease)   . Hypertension   . Hypothyroidism   . IBS (irritable bowel syndrome)   . Legally blind   . Macular degeneration   . Myocardial infarction [facility]) may 27th 2007  . Pneumonia 07/16/2013    HX PNEUMONIA  . Pulmonary hypertension ([place])   . Raynaud's phenomenon   . Scleroderma ([place])   . Sjogren's disease ([place])   . Sleep apnea    STOP BANG SCORE 5  . Stroke [facility]) 1990   brain stem stroke - weakness rt hand  . Zenker's diverticulum     Patient Active Problem List   Diagnosis Date Noted  . Jaw pain  06/17/2018  . SOB (shortness of breath), secondary to [place] 07/16/2017  . Chest pain, moderate coronary artery risk 07/13/2017  . Hypoxia 07/09/2017  . Dehydration   . Enteritis 11/17/2016  . Nondisplaced fracture of second metatarsal bone, left foot, subsequent encounter for fracture with routine healing 06/29/2016  . Arthralgia of both lower legs 03/30/2016  . Hypothyroidism 10/01/2015  . HCAP (healthcare-associated pneumonia) 05/16/2015  . Diarrhea 05/02/2015  . Diarrhea with dehydration 05/02/2015  . Hyponatremia 05/02/2015  . Weakness 01/29/2015  . Pain in joint, ankle and foot 11/20/2014  . Acute bronchitis 09/01/2014  . CAP (community acquired pneumonia) 05/26/2014  . Dysphagia, unspecified(787.20) 06/18/2013  . Stricture and stenosis of esophagus 06/18/2013  . Urinary incontinence in female 05/06/2012  . Fibromyalgia   . Chronic confusional state 03/21/2012  . Postoperative anemia 03/21/2012  . Anemia of chronic disease 03/19/2012  . Diabetes mellitus ([place]) 05/30/2011  . COPD (chronic obstructive pulmonary disease) ([place]) 05/30/2011  . Colonic stricture s/p lap sigmoid colectomy 03/18/2012 03/27/2011  . Diverticulosis of colon 03/27/2011  . Systemic sclerosis ([place]) 06/29/2009  . Pure hypercholesterolemia 05/25/2009  . BOOP (bronchiolitis obliterans with organizing pneumonia) ([place]) 02/18/2009  . Chronic  rhinitis 07/08/2007  . EMPHYSEMA 07/08/2007  . Hyperlipidemia 06/26/2007  . Depression 06/26/2007  . Legal blindness 06/26/2007  . Essential hypertension 06/26/2007  . Coronary atherosclerosis 06/26/2007  . ZENKER'S DIVERTICULUM 06/26/2007  . Irritable bowel syndrome 06/26/2007    Past Surgical History:  Procedure Laterality Date  . APPENDECTOMY    . BALLOON DILATION N/A 07/15/2015   Procedure: BALLOON DILATION;  Surgeon: Milus Banister, MD;  Location: [facility];  Service: Endoscopy;  Laterality: N/A;  . BILATERAL OOPHORECTOMY  2002  . BRONCHOSCOPY  02-22-09  .  CARDIAC CATHETERIZATION     x 3  . CATARACT EXTRACTION    . CHOLECYSTECTOMY  1965  . COLON SURGERY  2014   colon resection  . colonectomy    . ESOPHAGOGASTRODUODENOSCOPY (EGD) WITH ESOPHAGEAL DILATION N/A 06/18/2013   Procedure: ESOPHAGOGASTRODUODENOSCOPY (EGD) WITH ESOPHAGEAL DILATION;  Surgeon: Inda Castle, MD;  Location: [facility];  Service: Endoscopy;  Laterality: N/A;  . FLEXIBLE SIGMOIDOSCOPY N/A 07/15/2015   Procedure: FLEXIBLE SIGMOIDOSCOPY;  Surgeon: Milus Banister, MD;  Location: [facility];  Service: Endoscopy;  Laterality: N/A;  . lobe thyroid removal    . PROCTOSCOPY  03/18/2012   Procedure: PROCTOSCOPY;  Surgeon: Adin Hector, MD;  Location: [facility];  Service: General;  Laterality: N/A;  rigid procto  . RADIOLOGY WITH ANESTHESIA N/A 03/09/2015   Procedure: MRI CERVICAL SPINE WITHOUT AND LUMBER WITHOUT;  Surgeon: Medication Radiologist, MD;  Location: [facility];  Service: Radiology;  Laterality: N/A;  . [place]   and implantation of a plate   . STENTED CARDIAC  ARTERY  2007 / 2007 / 2010   X 3 STENT  . THYROID LOBECTOMY  1991   removal of left lobe thyroid  . TOTAL ABDOMINAL HYSTERECTOMY  1973     OB History   No obstetric history on file.      Home Medications    Prior to Admission medications   Medication Sig Start Date End Date Taking? Authorizing Provider  aspirin EC 81 MG tablet Take 1 tablet (81 mg total) by mouth daily. 11/18/13   Fay Records, MD  cetirizine (ZYRTEC) 10 MG tablet Take 10 mg by mouth daily after breakfast.     [provider]  esomeprazole (NEXIUM) 40 MG capsule Take 40 mg by mouth daily.     [provider]  fluticasone (FLONASE) 50 MCG/ACT nasal spray Place 1 spray into both nostrils daily. 07/11/17   Ghimire, Henreitta Leber, MD  guaiFENesin (MUCINEX) 600 MG 12 hr tablet Take 600 mg by mouth daily as needed for to loosen phlegm.     [provider]  HYDROcodone-acetaminophen (NORCO) 7.5-325 MG tablet  Take 1 tablet by mouth every 6 (six) hours as needed for moderate pain.    [provider]  imipramine (TOFRANIL) 25 MG tablet Take 75 mg by mouth at bedtime.  05/26/09   [provider]  insulin lispro (HUMALOG KWIKPEN) 100 UNIT/ML KiwkPen Inject 5-6 Units into the skin 3 (three) times daily with meals. Inject 6 units subcutaneously with breakfast and lunch, inject 5 units with supper    [provider]  levothyroxine (SYNTHROID, LEVOTHROID) 100 MCG tablet Take 100 mcg by mouth daily before breakfast.    [provider]  montelukast (SINGULAIR) 10 MG tablet Take 10 mg by mouth at bedtime.  05/10/15   [provider]  predniSONE (DELTASONE) 5 MG tablet TAKE 1 TABLET ONCE A DAY WITH BREAKFAST. Schedule office visit  for additional refills 06/05/18   Chesley Mires, MD  triazolam (HALCION) 0.25 MG tablet Take 0.25 mg by mouth at bedtime as needed for sleep. For sleep    [provider]    Family History Family History  Problem Relation Age of Onset  . Other Mother 63       Died from sepsis  . Stroke Father 18       died  . Heart disease Father   . Diabetes Father   . Cancer Sister        lung  . Diabetes Sister   . Cancer Brother        lung  . Diabetes Brother   . Diabetes Brother     Social History Social History   Tobacco Use  . Smoking status: Former Smoker    Packs/day: 1.00    Years: 40.00    Pack years: 40.00    Types: Cigarettes    Last attempt to quit: 06/26/1988    Years since quitting: 29.9  . Smokeless tobacco: Never Used  . Tobacco comment: 40 pack years  Substance Use Topics  . Alcohol use: No  . Drug use: No     Allergies   Sulfa antibiotics; Lantus [insulin glargine]; Levaquin [levofloxacin in d5w]; Maxidex [dexamethasone]; Metoprolol-hydrochlorothiazide; Norvasc [amlodipine]; Novolog [insulin aspart]; Peanut oil; Penciclovir; Senna [sennosides]; Sulfonamide derivatives; Titanium; Yellow dye; and Adhesive  [tape]   Review of Systems Review of Systems  Constitutional: Positive for fatigue and fever.  HENT: Positive for congestion.   Respiratory: Positive for cough.   Neurological: Positive for weakness.  All other systems reviewed and are negative.    Physical Exam Updated Vital Signs BP (!) 116/98   Pulse 80   Temp (S) 98.9 F (37.2 C) (Rectal)   Resp (!) 22   Ht 5\' 2"  (1.575 m)   Wt 59 kg   SpO2 99%   BMI 23.78 kg/m   Physical Exam Vitals signs and nursing note reviewed.  Constitutional:      Appearance: Normal appearance. She is normal weight.  HENT:     Head: Normocephalic and atraumatic.     Right Ear: External ear normal.     Left Ear: External ear normal.     Nose: Congestion present.     Mouth/Throat:     Mouth: Mucous membranes are moist.     Pharynx: Oropharynx is clear.  Eyes:     Extraocular Movements: Extraocular movements intact.     Conjunctiva/sclera: Conjunctivae normal.     Pupils: Pupils are equal, round, and reactive to light.  Neck:     Musculoskeletal: Normal range of motion and neck supple.  Cardiovascular:     Rate and Rhythm: Normal rate and regular rhythm.     Pulses: Normal pulses.     Heart sounds: Normal heart sounds.  Pulmonary:     Effort: Pulmonary effort is normal.     Breath sounds: Rhonchi present.  Abdominal:     General: Abdomen is flat. Bowel sounds are normal.     Palpations: Abdomen is soft.  Musculoskeletal: Normal range of motion.  Skin:    General: Skin is warm and dry.     Capillary Refill: Capillary refill takes less than 2 seconds.  Neurological:     General: No focal deficit present.     Mental Status: She is alert and oriented to person, place, and time.  Psychiatric:        Mood and Affect: Mood normal.  Behavior: Behavior normal.        Thought Content: Thought content normal.        Judgment: Judgment normal.      ED Treatments / Results  Labs (all labs ordered are listed, but only abnormal  results are displayed) Labs Reviewed  CBC WITH DIFFERENTIAL/PLATELET - Abnormal; Notable for the following components:      Result Value   WBC 21.5 (*)    Neutro Abs 18.2 (*)    Monocytes Absolute 1.3 (*)    Abs Immature Granulocytes 0.18 (*)    All other components within normal limits  COMPREHENSIVE METABOLIC PANEL - Abnormal; Notable for the following components:   Sodium 133 (*)    Potassium 3.3 (*)    Chloride 95 (*)    Glucose, Bld 184 (*)    BUN 25 (*)    Creatinine, Ser 1.06 (*)    Calcium 8.7 (*)    Total Protein 6.3 (*)    Albumin 3.3 (*)    GFR calc non Af Amer 48 (*)    GFR calc Af Amer 55 (*)    All other components within normal limits  CBG MONITORING, ED - Abnormal; Notable for the following components:   Glucose-Capillary 143 (*)    All other components within normal limits  CULTURE, BLOOD (ROUTINE X 2)  CULTURE, BLOOD (ROUTINE X 2)  URINALYSIS, ROUTINE W REFLEX MICROSCOPIC  I-STAT CG4 LACTIC ACID, ED  I-STAT TROPONIN, ED    EKG EKG Interpretation  Date/Time:  Monday June 17 2018 19:04:12 EST Ventricular Rate:  80 PR Interval:    QRS Duration: 86 QT Interval:  389 QTC Calculation: 449 R Axis:   71 Text Interpretation:  Sinus rhythm Short PR interval Repol abnrm suggests ischemia, anterolateral new t wave inversions Confirmed by Isla Pence [phone]) on 06/17/2018 7:06:53 PM   Radiology Dg Chest 2 View  Result Date: 06/17/2018 CLINICAL DATA:  Dyspnea with productive cough and fever. EXAM: CHEST - 2 VIEW COMPARISON:  07/13/2017 FINDINGS: Heart size and mediastinal contours are stable with aortic atherosclerosis. Surgical clips project over the base of the neck on the left prior left thyroid lobectomy. Asymmetric airspace opacities are identified about the right hilum, right upper and lower lobe predominant compatible with multilobar pneumonia, less likely unilateral CHF. Left lung remains relatively clear. No acute osseous abnormality. IMPRESSION:  Pulmonary opacities about the right hilum, right upper and lower lobes compatible with multilobar pneumonia, less likely unilateral CHF given history of fever and cough. Electronically Signed   By: Ashley Royalty M.D.   On: 06/17/2018 18:25    Procedures Procedures (including critical care time)  Medications Ordered in ED Medications  azithromycin (ZITHROMAX) 500 mg in sodium chloride 0.9 % 250 mL IVPB (500 mg Intravenous New Bag/Given 06/17/18 2014)  sodium chloride 0.9 % bolus 1,000 mL (1,000 mLs Intravenous New Bag/Given 06/17/18 2015)  sodium chloride 0.9 % bolus 1,000 mL (0 mLs Intravenous Stopped 06/17/18 2016)  cefTRIAXone (ROCEPHIN) 1 g in sodium chloride 0.9 % 100 mL IVPB (0 g Intravenous Stopped 06/17/18 2016)     Initial Impression / Assessment and Plan / ED Course  I have reviewed the triage vital signs and the nursing notes.  Pertinent labs & imaging results that were available during my care of the patient were reviewed by me and considered in my medical decision making (see chart for details).    BP has improved with IVFs.  Lactic is normal.  Kidney function is slightly  off due to dehydration.  CXR shows multifocal pneumonia, so she was given IV rocephin and zithromax.  She has new EKG t wave changes today, but troponin is negative.  CRITICAL CARE Performed by: Isla Pence   Total critical care time: 30 minutes  Critical care time was exclusive of separately billable procedures and treating other patients.  Critical care was necessary to treat or prevent imminent or life-threatening deterioration.  Critical care was time spent personally by me on the following activities: development of treatment plan with patient and/or surrogate as well as nursing, discussions with consultants, evaluation of patient's response to treatment, examination of patient, obtaining history from patient or surrogate, ordering and performing treatments and interventions, ordering and review of  laboratory studies, ordering and review of radiographic studies, pulse oximetry and re-evaluation of patient's condition.  Pt d/w Dr. Marlowe Sax (triad) for admission.  Final Clinical Impressions(s) / ED Diagnoses   Final diagnoses:  Community acquired pneumonia, unspecified laterality    ED Discharge Orders    None       Isla Pence, MD 06/17/18 2030

## 2018-06-17 NOTE — ED Triage Notes (Signed)
Pt BIB [facility] from doctors office. Pt woke up this morning with generalized weakness, body aches, and left sided jaw pain. Temp at home 103.6. Gave self aspirin and made appointment with doctor. Primary complaint weakness.

## 2018-06-17 NOTE — ED Notes (Signed)
Patient transported to X-ray 

## 2018-06-17 NOTE — Consult Note (Signed)
[facility] Consult Note   Primary Physician:  Merrilee Seashore, MD       Primary Cardiologist:   Dorris Carnes, MD  Reason for Consultation:  Abnormal ECG and jaw pain  HPI:    [name] is seen today for evaluation of jaw pain at the request of Dr. Marlowe Sax. [name] is an 84 year old female with a past medical history significant for CAD (with stents to the LM and RCA in the past - last cath 2011: 40% LAD; 50% LCx ; 80% small OM1: RCA 20%), BOOP (followed by pulmonary), chronic diverticulitis, hyperlipidemia, hypertension, stroke in 1990s, pulmonary hypertension, anemia, depression, GERD, and type 2 diabetes who presents to the hospital with complaints of generalized weakness, low blood pressures and fevers at home (up to 103.6 - for which she took ASA). She initially went to the pulmonologist's office where a Flu test was negative. She has struggled with a sinus infection (sinus drainage and cough) and took Augmentin for it. She also had jaw pain earlier today (this resolved at around noon time). Earlier in 2019 she had a nuclear stress test for generalized weakness. The results were unremarkable.  In the ED the initial troponin was <0.03. The ECG revealed sinus rhythm, rate 80 bpm, T wave inversions in the inferolateral leads (slightly worse when compared to ECG from 07/19/17)   Recent cardiac work-up/imaging Nuclear test - 09/11/17 Myocardial perfusion is normal. The study is normal. This is a low risk study. Overall left ventricular systolic function was normal. LV cavity size is normal. Nuclear stress EF: 64%. The left ventricular ejection fraction is normal (55-65%).   Echocardiogram - 07/10/2017 - Left ventricle: The cavity size was normal. Wall thickness was   increased in a pattern of moderate LVH. Systolic function was   vigorous. The estimated ejection fraction was in the range of 65%   to 70%. Wall motion was normal; there were no regional wall   motion  abnormalities. Doppler parameters are consistent with   abnormal left ventricular relaxation (grade 1 diastolic   dysfunction). - Aortic valve: There was no stenosis. - Mitral valve: Mildly calcified annulus. Mildly calcified leaflets   . There was no significant regurgitation. - Right ventricle: The cavity size was normal. Systolic function   was normal. - Tricuspid valve: Peak RV-RA gradient (S): 38 mm Hg. - Pulmonary arteries: PA peak pressure: 41 mm Hg (S). - Inferior vena cava: The vessel was normal in size. The   respirophasic diameter changes were in the normal range (>= 50%),   consistent with normal central venous pressure. Impressions: - Normal LV size with moderate LV hypertrophy. Vigorous systolic   function, EF 81-85%. Normal RV size and systolic function. No   significant valvular abnormalities. Mild pulmonary hypertension.     Home Medications Prior to Admission medications   Medication Sig Start Date End Date Taking? Authorizing Provider  aspirin EC 81 MG tablet Take 1 tablet (81 mg total) by mouth daily. 11/18/13  Yes Fay Records, MD  esomeprazole (NEXIUM) 40 MG capsule Take 40 mg by mouth daily.    Yes [provider]  fluticasone (FLONASE) 50 MCG/ACT nasal spray Place 1 spray into both nostrils daily. 07/11/17  Yes Ghimire, Henreitta Leber, MD  HYDROcodone-acetaminophen (NORCO) 7.5-325 MG tablet Take 1 tablet by mouth every 6 (six) hours as needed for moderate pain.   Yes [provider]  imipramine (TOFRANIL) 25 MG tablet Take 75 mg by mouth at bedtime.  05/26/09  Yes [provider]  insulin lispro (HUMALOG KWIKPEN) 100 UNIT/ML KiwkPen Inject 5-6 Units into the skin 3 (three) times daily with meals. Inject 6 units subcutaneously with breakfast and lunch, inject 5 units with supper   Yes [provider]  levothyroxine (SYNTHROID, LEVOTHROID) 100 MCG tablet Take 100 mcg by mouth daily before breakfast.   Yes [provider]    montelukast (SINGULAIR) 10 MG tablet Take 10 mg by mouth at bedtime.  05/10/15  Yes [provider]  predniSONE (DELTASONE) 5 MG tablet TAKE 1 TABLET ONCE A DAY WITH BREAKFAST. Schedule office visit for additional refills Patient taking differently: Take 5 mg by mouth daily with breakfast.  06/05/18  Yes Chesley Mires, MD  triazolam (HALCION) 0.25 MG tablet Take 0.25 mg by mouth at bedtime as needed for sleep. For sleep   Yes [provider]    Past Medical History: Past Medical History:  Diagnosis Date  . Adenomatous colon polyp   . Arthritis   . Blood transfusion   . Cancer [facility])    cervical cancer pre hysterectomy  . Cervical disc disease   . Chronic sinusitis   . CONSTIPATION, CHRONIC 06/26/2007   Qualifier: Diagnosis of  By: Nils Pyle CMA ([place]), Mearl Latin    . COPD (chronic obstructive pulmonary disease) ([place])   . Coronary artery disease   . CVA (cerebral infarction) 1991  . Depression   . Diabetes mellitus   . Diverticulosis   . 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 Dyslipidemia   . Emphysema   . Fibromyalgia   . GERD (gastroesophageal reflux disease)   . Hypertension   . Hypothyroidism   . IBS (irritable bowel syndrome)   . Legally blind   . Macular degeneration   . Myocardial infarction [facility]) may 27th 2007  . Pneumonia 07/16/2013    HX PNEUMONIA  . Pulmonary hypertension ([place])   . Raynaud's phenomenon   . Scleroderma ([place])   . Sjogren's disease ([place])   . Sleep apnea    STOP BANG SCORE 5  . Stroke [facility]) 1990   brain stem stroke - weakness rt hand  . Zenker's diverticulum     Past Surgical History: Past Surgical History:  Procedure Laterality Date  . APPENDECTOMY    . BALLOON DILATION N/A 07/15/2015   Procedure: BALLOON DILATION;  Surgeon: Milus Banister, MD;  Location: [facility];  Service: Endoscopy;  Laterality: N/A;  . BILATERAL OOPHORECTOMY  2002  . BRONCHOSCOPY  02-22-09  . CARDIAC CATHETERIZATION     x 3  . CATARACT EXTRACTION    .  CHOLECYSTECTOMY  1965  . COLON SURGERY  2014   colon resection  . colonectomy    . ESOPHAGOGASTRODUODENOSCOPY (EGD) WITH ESOPHAGEAL DILATION N/A 06/18/2013   Procedure: ESOPHAGOGASTRODUODENOSCOPY (EGD) WITH ESOPHAGEAL DILATION;  Surgeon: Inda Castle, MD;  Location: [facility];  Service: Endoscopy;  Laterality: N/A;  . FLEXIBLE SIGMOIDOSCOPY N/A 07/15/2015   Procedure: FLEXIBLE SIGMOIDOSCOPY;  Surgeon: Milus Banister, MD;  Location: [facility];  Service: Endoscopy;  Laterality: N/A;  . lobe thyroid removal    . PROCTOSCOPY  03/18/2012   Procedure: PROCTOSCOPY;  Surgeon: Adin Hector, MD;  Location: [facility];  Service: General;  Laterality: N/A;  rigid procto  . RADIOLOGY WITH ANESTHESIA N/A 03/09/2015   Procedure: MRI CERVICAL SPINE WITHOUT AND LUMBER WITHOUT;  Surgeon: Medication Radiologist, MD;  Location: [place];  Service: Radiology;  Laterality: N/A;  . [place]   and  implantation of a plate   . STENTED CARDIAC  ARTERY  2007 / 2007 / 2010   X 3 STENT  . THYROID LOBECTOMY  1991   removal of left lobe thyroid  . TOTAL ABDOMINAL HYSTERECTOMY  1973    Family History: Family History  Problem Relation Age of Onset  . Other Mother 13       Died from sepsis  . Stroke Father 20       died  . Heart disease Father   . Diabetes Father   . Cancer Sister        lung  . Diabetes Sister   . Cancer Brother        lung  . Diabetes Brother   . Diabetes Brother     Social History: Social History   Socioeconomic History  . Marital status: Divorced    Spouse name: Not on file  . Number of children: Not on file  . Years of education: Not on file  . Highest education level: Not on file  Occupational History  . Occupation: retired    Fish farm manager: RETIRED  Social Needs  . Financial resource strain: Not on file  . Food insecurity:    Worry: Not on file    Inability: Not on file  . Transportation needs:    Medical: Not on file    Non-medical: Not on file  Tobacco Use    . Smoking status: Former Smoker    Packs/day: 1.00    Years: 40.00    Pack years: 40.00    Types: Cigarettes    Last attempt to quit: 06/26/1988    Years since quitting: 29.9  . Smokeless tobacco: Never Used  . Tobacco comment: 40 pack years  Substance and Sexual Activity  . Alcohol use: No  . Drug use: No  . Sexual activity: Not on file  Lifestyle  . Physical activity:    Days per week: Not on file    Minutes per session: Not on file  . Stress: Not on file  Relationships  . Social connections:    Talks on phone: Patient refused    Gets together: Patient refused    Attends religious service: Patient refused    Active member of club or organization: Patient refused    Attends meetings of clubs or organizations: Patient refused    Relationship status: Patient refused  Other Topics Concern  . Not on file  Social History Narrative  . Not on file    Allergies:  Allergies  Allergen Reactions  . Sulfa Antibiotics Other (See Comments)    Other reaction(s): Other (See Comments) Other Reaction: anaphalaxsis  Other Reaction: anaphalaxsis   . Lantus [Insulin Glargine]     Patient states "sulfate binder causes sinus infection/pneumonia".  Mack [place] [Levofloxacin In D5w] Other (See Comments)    Muscle aches  . Maxidex [Dexamethasone] Other (See Comments)    Insomnia, anxiety, dizziness  . Metoprolol-Hydrochlorothiazide Other (See Comments)     decreased bp  . Norvasc [Amlodipine] Other (See Comments)    BIL Ankle edema  . Novolog [Insulin Aspart]     Patient states "sulfate binder causes sinus infection/pneumonia". Tolerates Humalog.  . Peanut Oil     Other reaction(s): Rash And throat tightening  . Penciclovir     Other reaction(s): Muscle Pain  . Senna [Sennosides] Other (See Comments)    Pt states that it causes a stinging sensation  . Sulfonamide Derivatives Hives  . Titanium Other (See Comments)  Neck wouldn't heal with titanium cervical plate  . Yellow Dye      Other reaction(s): Bee stings all over body when eating/taking medications with red or yellow dye   . Adhesive [Tape] Hives and Rash     Review of Systems: [y] = yes, [ ]  = no   . General: Weight gain [ ] ; Weight loss [ ] ; Anorexia [ ] ; Fatigue [y]; Fever [y]; Chills [ ] ; Weakness [ ]   . Cardiac: Chest pain/pressure [ ] ; Resting SOB [ ] ; Exertional SOB [ ] ; Orthopnea [ ] ; Pedal Edema [ ] ; Palpitations [ ] ; Syncope [ ] ; Presyncope [ ] ; Paroxysmal nocturnal dyspnea[ ]   . Pulmonary: Cough Blue.Reese ]; Wheezing[ ] ; Hemoptysis[ ] ; Sputum [ ] ; Snoring [ ]   . GI: Vomiting[ ] ; Dysphagia[ ] ; Melena[ ] ; Hematochezia [ ] ; Heartburn[ ] ; Abdominal pain [ ] ; Constipation [ ] ; Diarrhea [ ] ; BRBPR [ ]   . GU: Hematuria[ ] ; Dysuria [ ] ; Nocturia[ ]   . Vascular: Pain in legs with walking [ ] ; Pain in feet with lying flat [ ] ; Non-healing sores [ ] ; Stroke [ ] ; TIA [ ] ; Slurred speech [ ] ;  . Neuro: Headaches[ ] ; Vertigo[ ] ; Seizures[ ] ; Paresthesias[ ] ;Blurred vision [ ] ; Diplopia [ ] ; Vision changes [ ]   . Ortho/Skin: Arthritis [ ] ; Joint pain [ ] ; Muscle pain [ ] ; Joint swelling [ ] ; Back Pain [ ] ; Rash [ ]   . Psych: Depression[ ] ; Anxiety[ ]   . Heme: Bleeding problems [ ] ; Clotting disorders [ ] ; Anemia [ ]   . Endocrine: Diabetes [ ] ; Thyroid dysfunction[ ]      Objective:    Vital Signs:   Temp:  [98.3 F (36.8 C)-98.9 F (37.2 C)] 98.3 F (36.8 C) (12/23 2223) Pulse Rate:  [78-87] 78 (12/23 2223) Resp:  [17-28] 17 (12/23 2223) BP: (93-140)/(31-98) 119/59 (12/23 2223) SpO2:  [95 %-100 %] 95 % (12/23 2223) Weight:  [58.5 kg-59 kg] 59 kg (12/23 1719)    Weight change: Filed Weights   06/17/18 1719  Weight: 59 kg    Intake/Output:   Intake/Output Summary (Last 24 hours) at 06/17/2018 2252 Last data filed at 06/17/2018 2142 Gross per 24 hour  Intake 2471.18 ml  Output -  Net 2471.18 ml      Physical Exam    General:  Well appearing. No resp difficulty HEENT: normal Neck: supple. JVP .  Carotids 2+ bilat; no bruits. No lymphadenopathy or thyromegaly appreciated. Cor: PMI nondisplaced. Regular rate & rhythm. No rubs, gallops or murmurs. Lungs: occasional rhonchi Abdomen: soft, nontender, nondistended. No hepatosplenomegaly. No bruits or masses. Good bowel sounds. Extremities: no cyanosis, clubbing, rash, edema Neuro: alert & orientedx3, cranial nerves grossly intact. moves all 4 extremities w/o difficulty. Affect pleasant      Labs   Basic Metabolic Panel: Recent Labs  Lab 06/17/18 1840  NA 133*  K 3.3*  CL 95*  CO2 25  GLUCOSE 184*  BUN 25*  CREATININE 1.06*  CALCIUM 8.7*    Liver Function Tests: Recent Labs  Lab 06/17/18 1840  AST 20  ALT 15  ALKPHOS 43  BILITOT 1.0  PROT 6.3*  ALBUMIN 3.3*   No results for input(s): LIPASE, AMYLASE in the last 168 hours. No results for input(s): AMMONIA in the last 168 hours.  CBC: Recent Labs  Lab 06/17/18 1840  WBC 21.5*  NEUTROABS 18.2*  HGB 12.3  HCT 38.8  MCV 86.0  PLT 191    Cardiac Enzymes: Recent Labs  Lab  06/17/18 2116  TROPONINI <0.03    BNP: BNP (last 3 results) Recent Labs    07/09/17 1411 07/13/17 1047  BNP 39.5 214.7*    ProBNP (last 3 results) No results for input(s): PROBNP in the last 8760 hours.   CBG: Recent Labs  Lab 06/17/18 1724 06/17/18 2219  GLUCAP 143* 121*    Coagulation Studies: No results for input(s): LABPROT, INR in the last 72 hours.   Imaging   Dg Chest 2 View - Result Date: 06/17/2018 FINDINGS: Heart size and mediastinal contours are stable with aortic atherosclerosis. Surgical clips project over the base of the neck on the left prior left thyroid lobectomy. Asymmetric airspace opacities are identified about the right hilum, right upper and lower lobe predominant compatible with multilobar pneumonia, less likely unilateral CHF. Left lung remains relatively clear. No acute osseous abnormality.  IMPRESSION: Pulmonary opacities about the right  hilum, right upper and lower lobes compatible with multilobar pneumonia, less likely unilateral CHF given history of fever and cough.   Medications:     Current Medications: . [START ON 06/18/2018] aspirin EC  81 mg Oral Daily  . aspirin  325 mg Oral Once  . enoxaparin (LOVENOX) injection  30 mg Subcutaneous Q24H  . [START ON 06/18/2018] fluticasone  1 spray Each Nare Daily  . imipramine  75 mg Oral QHS  . [START ON 06/18/2018] insulin aspart  0-9 Units Subcutaneous TID WC  . [START ON 06/18/2018] levothyroxine  100 mcg Oral QAC breakfast  . montelukast  10 mg Oral QHS  . [START ON 06/18/2018] pantoprazole sodium  40 mg Per Tube Daily  . [START ON 06/18/2018] predniSONE  5 mg Oral Q breakfast     Infusions: . sodium chloride 125 mL/hr at 06/17/18 2231  . [START ON 06/18/2018] azithromycin    . [START ON 06/18/2018] cefTRIAXone (ROCEPHIN)  IV         Assessment/Plan   1. Abnormal ECG and jaw pain The patient presents with fevers, elevated WBCs and signs of a pneumonia. She has been started on antibiotics. She takes steroids chronically. She has a history of CAD and had jaw pain in the morning (this resolved on its own). Initial troponin is negative. Her last echo revealed moderate LVH with preserved LV function. Her ECG reveals T wave abnormalities. It is quite possible that she had demand ischemia due to her recent infection (in the setting of known CAD) and hypotension.  - Continue to cycle cardiac biomarkers - Obtain serial ECGs - Continue ASA 81 mg daily - She has a stated sensitivity to metoprolol that causes profound hypotension, so avoid. - Consider adding a statin (if there is no history of intolerance/allergy to this class of medications) - Supportive therapy for low BP (would be very careful with the use of NTG) - Would hold off on IV unfractionated heparin unless Troponin becomes positive - Consider a repeat echocardiogram    Meade Maw, MD  06/17/2018,  10:52 PM  Cardiology Overnight Team Please contact [facility] for night-coverage after hours (4p -7a ) and weekends on [email or web address]

## 2018-06-17 NOTE — Telephone Encounter (Signed)
Sorry to hear the patient not feeling well.  With the temperature, patient's chronic prednisone use, age, and comorbid conditions patient will definitely need an office visit for further evaluation.  If patient is unable to present to our office I suggest the patient present to an urgent care and emergency room for further evaluation.  [name] FNP

## 2018-06-17 NOTE — Telephone Encounter (Signed)
Called and spoke with patients daughter, she has been scheduled to be seen by Aaron Edelman tomorrow. Patient declined appointment for coming in today.  Both the patient and daughter are aware that if the patient gets worse she needs to be seen in the ER. Nothing further needed.

## 2018-06-17 NOTE — Telephone Encounter (Signed)
Call received from [employer] [facility] she states the patient called her this morning and states she had 103 degree temp and she is complaining SOB, cough, headache and increased fatigue. States she has been feeling bad for last 2 days. Offered an appointment, patient declines. I did make patient VS is not in the office and she may be asked to have a office visit due to temp.   BPM please advise. Thanks.

## 2018-06-17 NOTE — H&P (Signed)
History and Physical    [name] ZHG:[id number] DOB: [birth year] DOA: 06/17/2018  PCP: Merrilee Seashore, MD Patient coming from: Pulmonologist's office  Chief Complaint: Generalized weakness  HPI: [name] is a 84 y.o. female with medical history significant of COPD, CVA, depression, type 2 diabetes, GERD, hypertension, hypothyroidism, emphysema on chronic prednisone, scleroderma being sent to the hospital from her pulmonologist's office for evaluation of generalized weakness, low blood pressure, and jaw pain. Flu test negative and blood pressure 96/40 recorded at pulmonologist's office.  Patient states she had a fever of 103.6 F this morning and has been feeling fatigued.  States she was in her usual state of health yesterday and even shopped at [employer].  She has been coughing today and having postnasal drainage.  Reports having intermittent substernal chest pressure for the past 3 weeks even at rest which is associated with shortness of breath.  No associated nausea or diaphoresis.  Reports having pain on both sides of her jaw since this morning.  States she has had 4 heart attacks before and 3 stents.  Her cardiologist is Dr. Dorris Carnes.  ED Course: Afebrile, not tachycardic.  Systolic in the 19Q and diastolic in the 22W to 97L.  Blood pressure improved with IV fluid resuscitation.  White count 21.5.  Lactic acid normal.  EKG showing T wave inversions in inferior and lateral leads.  I-STAT troponin negative.  Blood culture x2 pending.  UA pending.  Chest x-ray showing pulmonary opacities about the right hilum, right upper and lower lobes compatible with multifocal pneumonia.   Review of Systems: As per HPI otherwise 10 point review of systems negative.  Past Medical History:  Diagnosis Date  . Adenomatous colon polyp   . Arthritis   . Blood transfusion   . Cancer [facility])    cervical cancer pre hysterectomy  . Cervical disc disease   . Chronic sinusitis   . CONSTIPATION,  CHRONIC 06/26/2007   Qualifier: Diagnosis of  By: Nils Pyle CMA ([place]), Mearl Latin    . COPD (chronic obstructive pulmonary disease) ([place])   . Coronary artery disease   . CVA (cerebral infarction) 1991  . Depression   . Diabetes mellitus   . Diverticulosis   . 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 Dyslipidemia   . Emphysema   . Fibromyalgia   . GERD (gastroesophageal reflux disease)   . Hypertension   . Hypothyroidism   . IBS (irritable bowel syndrome)   . Legally blind   . Macular degeneration   . Myocardial infarction [facility]) may 27th 2007  . Pneumonia 07/16/2013    HX PNEUMONIA  . Pulmonary hypertension ([place])   . Raynaud's phenomenon   . Scleroderma ([place])   . Sjogren's disease ([place])   . Sleep apnea    STOP BANG SCORE 5  . Stroke [facility]) 1990   brain stem stroke - weakness rt hand  . Zenker's diverticulum     Past Surgical History:  Procedure Laterality Date  . APPENDECTOMY    . BALLOON DILATION N/A 07/15/2015   Procedure: BALLOON DILATION;  Surgeon: Milus Banister, MD;  Location: [facility];  Service: Endoscopy;  Laterality: N/A;  . BILATERAL OOPHORECTOMY  2002  . BRONCHOSCOPY  02-22-09  . CARDIAC CATHETERIZATION     x 3  . CATARACT EXTRACTION    . CHOLECYSTECTOMY  1965  . COLON SURGERY  2014   colon resection  . colonectomy    . ESOPHAGOGASTRODUODENOSCOPY (EGD) WITH ESOPHAGEAL DILATION N/A 06/18/2013   Procedure:  ESOPHAGOGASTRODUODENOSCOPY (EGD) WITH ESOPHAGEAL DILATION;  Surgeon: Inda Castle, MD;  Location: [place];  Service: Endoscopy;  Laterality: N/A;  . FLEXIBLE SIGMOIDOSCOPY N/A 07/15/2015   Procedure: FLEXIBLE SIGMOIDOSCOPY;  Surgeon: Milus Banister, MD;  Location: [facility];  Service: Endoscopy;  Laterality: N/A;  . lobe thyroid removal    . PROCTOSCOPY  03/18/2012   Procedure: PROCTOSCOPY;  Surgeon: Adin Hector, MD;  Location: [facility];  Service: General;  Laterality: N/A;  rigid procto  . RADIOLOGY WITH ANESTHESIA N/A 03/09/2015   Procedure: MRI CERVICAL  SPINE WITHOUT AND LUMBER WITHOUT;  Surgeon: Medication Radiologist, MD;  Location: [place];  Service: Radiology;  Laterality: N/A;  . [place]   and implantation of a plate   . STENTED CARDIAC  ARTERY  2007 / 2007 / 2010   X 3 STENT  . THYROID LOBECTOMY  1991   removal of left lobe thyroid  . TOTAL ABDOMINAL HYSTERECTOMY  1973     reports that she quit smoking about 29 years ago. Her smoking use included cigarettes. She has a 40.00 pack-year smoking history. She has never used smokeless tobacco. She reports that she does not drink alcohol or use drugs.  Allergies  Allergen Reactions  . Sulfa Antibiotics Other (See Comments)    Other reaction(s): Other (See Comments) Other Reaction: anaphalaxsis  Other Reaction: anaphalaxsis   . Lantus [Insulin Glargine]     Patient states "sulfate binder causes sinus infection/pneumonia".  Mack Hook [Levofloxacin In D5w] Other (See Comments)    Muscle aches  . Maxidex [Dexamethasone] Other (See Comments)    Insomnia, anxiety, dizziness  . Metoprolol-Hydrochlorothiazide Other (See Comments)     decreased bp  . Norvasc [Amlodipine] Other (See Comments)    BIL Ankle edema  . Novolog [Insulin Aspart]     Patient states "sulfate binder causes sinus infection/pneumonia". Tolerates Humalog.  . Peanut Oil     Other reaction(s): Rash And throat tightening  . Penciclovir     Other reaction(s): Muscle Pain  . Senna [Sennosides] Other (See Comments)    Pt states that it causes a stinging sensation  . Sulfonamide Derivatives Hives  . Titanium Other (See Comments)    Neck wouldn't heal with titanium cervical plate  . Yellow Dye     Other reaction(s): Bee stings all over body when eating/taking medications with red or yellow dye   . Adhesive [Tape] Hives and Rash    Family History  Problem Relation Age of Onset  . Other Mother 57       Died from sepsis  . Stroke Father 27       died  . Heart disease Father   . Diabetes Father   .  Cancer Sister        lung  . Diabetes Sister   . Cancer Brother        lung  . Diabetes Brother   . Diabetes Brother     Prior to Admission medications   Medication Sig Start Date End Date Taking? Authorizing Provider  aspirin EC 81 MG tablet Take 1 tablet (81 mg total) by mouth daily. 11/18/13   Fay Records, MD  cetirizine (ZYRTEC) 10 MG tablet Take 10 mg by mouth daily after breakfast.     [provider]  esomeprazole (NEXIUM) 40 MG capsule Take 40 mg by mouth daily.     [provider]  fluticasone (FLONASE) 50 MCG/ACT nasal spray Place 1 spray into both nostrils daily.  07/11/17   Ghimire, Henreitta Leber, MD  guaiFENesin (MUCINEX) 600 MG 12 hr tablet Take 600 mg by mouth daily as needed for to loosen phlegm.     [provider]  HYDROcodone-acetaminophen (NORCO) 7.5-325 MG tablet Take 1 tablet by mouth every 6 (six) hours as needed for moderate pain.    [provider]  imipramine (TOFRANIL) 25 MG tablet Take 75 mg by mouth at bedtime.  05/26/09   [provider]  insulin lispro (HUMALOG KWIKPEN) 100 UNIT/ML KiwkPen Inject 5-6 Units into the skin 3 (three) times daily with meals. Inject 6 units subcutaneously with breakfast and lunch, inject 5 units with supper    [provider]  levothyroxine (SYNTHROID, LEVOTHROID) 100 MCG tablet Take 100 mcg by mouth daily before breakfast.    [provider]  montelukast (SINGULAIR) 10 MG tablet Take 10 mg by mouth at bedtime.  05/10/15   [provider]  predniSONE (DELTASONE) 5 MG tablet TAKE 1 TABLET ONCE A DAY WITH BREAKFAST. Schedule office visit for additional refills 06/05/18   Chesley Mires, MD  triazolam (HALCION) 0.25 MG tablet Take 0.25 mg by mouth at bedtime as needed for sleep. For sleep    [provider]    Physical Exam: Vitals:   06/17/18 2115 06/17/18 2117 06/17/18 2130 06/17/18 2223  BP: (!) 120/45  140/75 (!) 119/59  Pulse: 82  81 78  Resp: (!) 28  17  17   Temp:  98.3 F (36.8 C)  98.3 F (36.8 C)  TempSrc:  Oral  Oral  SpO2: 100%  99% 95%  Weight:    63.2 kg  Height:    5\' 2"  (1.575 m)    Physical Exam  Constitutional: She is oriented to person, place, and time. No distress.  HENT:  Head: Normocephalic.  Mouth/Throat: Oropharynx is clear and moist.  Eyes: Right eye exhibits no discharge. Left eye exhibits no discharge.  Neck: Neck supple.  Cardiovascular: Normal rate, regular rhythm and intact distal pulses.  Murmur heard. Pulmonary/Chest: Effort normal. No respiratory distress. She has no wheezes. She has rales.  Rales appreciated at the right lung base  Abdominal: Soft. Bowel sounds are normal. She exhibits no distension. There is no abdominal tenderness. There is no guarding.  Musculoskeletal:        General: No edema.  Neurological: She is alert and oriented to person, place, and time.  Skin: Skin is warm and dry. She is not diaphoretic.     Labs on Admission: I have personally reviewed following labs and imaging studies  CBC: Recent Labs  Lab 06/17/18 1840  WBC 21.5*  NEUTROABS 18.2*  HGB 12.3  HCT 38.8  MCV 86.0  PLT 409   Basic Metabolic Panel: Recent Labs  Lab 06/17/18 1840  NA 133*  K 3.3*  CL 95*  CO2 25  GLUCOSE 184*  BUN 25*  CREATININE 1.06*  CALCIUM 8.7*   GFR: Estimated Creatinine Clearance: 33.9 mL/min (A) (by C-G formula based on SCr of 1.06 mg/dL (H)). Liver Function Tests: Recent Labs  Lab 06/17/18 1840  AST 20  ALT 15  ALKPHOS 43  BILITOT 1.0  PROT 6.3*  ALBUMIN 3.3*   No results for input(s): LIPASE, AMYLASE in the last 168 hours. No results for input(s): AMMONIA in the last 168 hours. Coagulation Profile: No results for input(s): INR, PROTIME in the last 168 hours. Cardiac Enzymes: Recent Labs  Lab 06/17/18 2116  TROPONINI <0.03   BNP (last 3 results) No  results for input(s): PROBNP in the last 8760 hours. HbA1C: No results for input(s): HGBA1C in the last 72  hours. CBG: Recent Labs  Lab 06/17/18 1724 06/17/18 2219  GLUCAP 143* 121*   Lipid Profile: No results for input(s): CHOL, HDL, LDLCALC, TRIG, CHOLHDL, LDLDIRECT in the last 72 hours. Thyroid Function Tests: No results for input(s): TSH, T4TOTAL, FREET4, T3FREE, THYROIDAB in the last 72 hours. Anemia Panel: No results for input(s): VITAMINB12, FOLATE, FERRITIN, TIBC, IRON, RETICCTPCT in the last 72 hours. Urine analysis:    Component Value Date/Time   COLORURINE YELLOW 06/18/2018 0225   APPEARANCEUR CLEAR 06/18/2018 0225   LABSPEC 1.010 06/18/2018 0225   PHURINE 6.0 06/18/2018 0225   GLUCOSEU NEGATIVE 06/18/2018 0225   GLUCOSEU NEGATIVE 03/24/2010 1137   HGBUR NEGATIVE 06/18/2018 0225   BILIRUBINUR NEGATIVE 06/18/2018 0225   KETONESUR NEGATIVE 06/18/2018 0225   PROTEINUR NEGATIVE 06/18/2018 0225   UROBILINOGEN 0.2 05/01/2015 2019   NITRITE NEGATIVE 06/18/2018 0225   LEUKOCYTESUR NEGATIVE 06/18/2018 0225    Radiological Exams on Admission: Dg Chest 2 View  Result Date: 06/17/2018 CLINICAL DATA:  Dyspnea with productive cough and fever. EXAM: CHEST - 2 VIEW COMPARISON:  07/13/2017 FINDINGS: Heart size and mediastinal contours are stable with aortic atherosclerosis. Surgical clips project over the base of the neck on the left prior left thyroid lobectomy. Asymmetric airspace opacities are identified about the right hilum, right upper and lower lobe predominant compatible with multilobar pneumonia, less likely unilateral CHF. Left lung remains relatively clear. No acute osseous abnormality. IMPRESSION: Pulmonary opacities about the right hilum, right upper and lower lobes compatible with multilobar pneumonia, less likely unilateral CHF given history of fever and cough. Electronically Signed   By: Ashley Royalty M.D.   On: 06/17/2018 18:25    EKG: Independently reviewed.  Sinus rhythm, artifact, T wave inversions in inferior and lateral leads.  Assessment/Plan Principal Problem:    Multifocal pneumonia Active Problems:   Depression   EMPHYSEMA   Type 2 diabetes mellitus (HCC)   Sepsis (HCC)   Hypothyroidism   Abnormal ECG   Chest pain   Sepsis secondary to multifocal pneumonia Not hypoxic.  Tachypneic. Blood pressure soft, improved with IV fluid resuscitation.  Subjective fever.  White count 21.5.  Lactic acid normal. Chest x-ray showing pulmonary opacities about the right hilum, right upper and lower lobes compatible with multifocal pneumonia.  -Continue ceftriaxone and azithromycin.  Will hold off vancomycin as MRSA PCR previously negative. -Blood culture x2 pending -Supplemental oxygen if needed -Tylenol PRN fevers -IV fluid -Repeat CBC in a.m.  Abnormal EKG and chest/jaw pain EKG showing T wave inversions in inferior and lateral leads.  Troponin x2 negative. Stress test done in March 2019 was low risk.  Patient was seen by cardiology and her EKG abnormalities are thought to be secondary to demand ischemia due to infection and hypotension. -Continue to trend troponin -Repeat EKG in a.m. -Received aspirin 325 mg once.  Continue aspirin 81 mg daily. -She has a stated sensitivity to metoprolol that causes profound hypotension, so avoid. -History of statin intolerance -Supportive therapy for low BP; avoid nitroglycerin -Cardiology recommending holding off heparin gtt unless troponin becomes positive -Echocardiogram  Type 2 diabetes Most recent CBG 121. -A1c -Sliding scale insulin sensitive -CBG checks  Depression -Continue home imipramine  Insomnia -Continue home triazolam as needed  Hypothyroidism -Continue home Synthroid  GERD -Continue PPI  Emphysema on chronic steroid -Continue home prednisone 5 mg daily  DVT prophylaxis: Lovenox Code Status:  Patient wishes to be full code. Family Communication: No family available. Disposition Plan: Anticipate discharge after clinical improvement. Consults called: Cardiology Admission status:  Observation   Shela Leff MD [facility] Pager [phone]  If 7PM-7AM, please contact night-coverage [email or web address] Password [facility]  06/18/2018, 3:23 AM

## 2018-06-17 NOTE — Patient Instructions (Signed)
Patient is having active jaw pain which is indicated MI in the past she is hypotensive in the office today. Her flu swab was negative. We advise for her to go to the ED and she agrees. EMS called to transport.

## 2018-06-17 NOTE — Progress Notes (Signed)
@Patient  ID: [name], female    DOB: 11/03/1930, 84 y.o.   MRN: [id number]  Chief Complaint  Patient presents with  . Fever    Had temp of 103 this morning. Pt stated jaws hurt, headache. SOB and fatigue started yesterday.    Referring provider: Merrilee Seashore, MD  HPI 84 y.o. 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  Tests: Pulmonary tests: August 2010 >> Bronchoscopy PFT 06/16/09 >> FEV1 1.99(118%), FEV1% 73, TLC 4.60(105%), DLCO 66%, no BD CT chest 11/24/11 >> centrilobular emphysema, RUL ASD CT sinus 07/30/13 >> opacification of Lt maxillary sinus, rightward NSD CT sinus 10/05/15 >> b/l mucosal edema paranasal sinuses, obstruction of ostium Rt maxillary sinus CT angio chest 07/09/17 >> GGO RUL  Cardiac tests: Echo 07/10/17 >> mod LVH, EF 65 to 70%, grade 1 DD, PAS 41 mmHg  OV 06/17/18 - acute fever Patient presents today with fever this a.m.  States that her fever was 92 F.  She states that she has body aches and very fatigued.  He is afebrile office now.  She has taken 2 aspirin today.  She does state that she has had sinus drainage and cough.  She complains today of jaw pain she is and she states that she has had this in the past when she had an MI.  Her EKG in office today was abnormal with ST and T wave abnormality.  She is hypotensive today.  She states that she felt just fine yesterday symptoms started this morning.  She denies any chest pain or edema.   Allergies  Allergen Reactions  . Sulfa Antibiotics Other (See Comments)    Other reaction(s): Other (See Comments) Other Reaction: anaphalaxsis  Other Reaction: anaphalaxsis   . Lantus [Insulin Glargine]     Patient states "sulfate binder causes sinus infection/pneumonia".  Mack Hook [Levofloxacin In D5w] Other (See Comments)    Muscle aches  . Maxidex [Dexamethasone] Other (See Comments)    Insomnia, anxiety,  dizziness  . Metoprolol-Hydrochlorothiazide Other (See Comments)     decreased bp  . Norvasc [Amlodipine] Other (See Comments)    BIL Ankle edema  . Novolog [Insulin Aspart]     Patient states "sulfate binder causes sinus infection/pneumonia". Tolerates Humalog.  . Peanut Oil     Other reaction(s): Rash And throat tightening  . Penciclovir     Other reaction(s): Muscle Pain  . Senna [Sennosides] Other (See Comments)    Pt states that it causes a stinging sensation  . Sulfonamide Derivatives Hives  . Titanium Other (See Comments)    Neck wouldn't heal with titanium cervical plate  . Yellow Dye     Other reaction(s): Bee stings all over body when eating/taking medications with red or yellow dye   . Adhesive [Tape] Hives and Rash    Immunization History  Administered Date(s) Administered  . Influenza Split 07/11/2011, 03/31/2015  . Influenza, High Dose Seasonal PF 07/14/2017  . Influenza,inj,Quad PF,6+ Mos 07/18/2013, 03/24/2016  . Pneumococcal Polysaccharide-23 07/18/2013    Past Medical History:  Diagnosis Date  . Adenomatous colon polyp   . Arthritis   . Blood transfusion   . Cancer [facility])    cervical cancer pre hysterectomy  . Cervical disc disease   . Chronic sinusitis   . CONSTIPATION, CHRONIC 06/26/2007   Qualifier: Diagnosis of  By: Nils Pyle CMA ([place]), Mearl Latin    . COPD (chronic obstructive pulmonary disease) ([place])   .  Coronary artery disease   . CVA (cerebral infarction) 1991  . Depression   . Diabetes mellitus   . Diverticulosis   . 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 Dyslipidemia   . Emphysema   . Fibromyalgia   . GERD (gastroesophageal reflux disease)   . Hypertension   . Hypothyroidism   . IBS (irritable bowel syndrome)   . Legally blind   . Macular degeneration   . Myocardial infarction [facility]) may 27th 2007  . Pneumonia 07/16/2013    HX PNEUMONIA  . Pulmonary hypertension ([place])   . Raynaud's phenomenon   . Scleroderma ([place])   . Sjogren's disease ([place])     . Sleep apnea    STOP BANG SCORE 5  . Stroke [facility]) 1990   brain stem stroke - weakness rt hand  . Zenker's diverticulum     Tobacco History: Social History   Tobacco Use  Smoking Status Former Smoker  . Packs/day: 1.00  . Years: 40.00  . Pack years: 40.00  . Types: Cigarettes  . Last attempt to quit: 06/26/1988  . Years since quitting: 29.9  Smokeless Tobacco Never Used  Tobacco Comment   40 pack years   Counseling given: Yes Comment: 40 pack years   Outpatient Encounter Medications as of 06/17/2018  Medication Sig  . aspirin EC 81 MG tablet Take 1 tablet (81 mg total) by mouth daily.  . cetirizine (ZYRTEC) 10 MG tablet Take 10 mg by mouth daily after breakfast.   . esomeprazole (NEXIUM) 40 MG capsule Take 40 mg by mouth daily.   . fluticasone (FLONASE) 50 MCG/ACT nasal spray Place 1 spray into both nostrils daily.  [name] guaiFENesin (MUCINEX) 600 MG 12 hr tablet Take 600 mg by mouth daily as needed for to loosen phlegm.   [name] HYDROcodone-acetaminophen (NORCO) 7.5-325 MG tablet Take 1 tablet by mouth every 6 (six) hours as needed for moderate pain.  [name] imipramine (TOFRANIL) 25 MG tablet Take 75 mg by mouth at bedtime.   . insulin lispro (HUMALOG KWIKPEN) 100 UNIT/ML KiwkPen Inject 5-6 Units into the skin 3 (three) times daily with meals. Inject 6 units subcutaneously with breakfast and lunch, inject 5 units with supper  . levothyroxine (SYNTHROID, LEVOTHROID) 100 MCG tablet Take 100 mcg by mouth daily before breakfast.  . montelukast (SINGULAIR) 10 MG tablet Take 10 mg by mouth at bedtime.   . predniSONE (DELTASONE) 5 MG tablet TAKE 1 TABLET ONCE A DAY WITH BREAKFAST. Schedule office visit for additional refills  . triazolam (HALCION) 0.25 MG tablet Take 0.25 mg by mouth at bedtime as needed for sleep. For sleep   No facility-administered encounter medications on file as of 06/17/2018.      Review of Systems  Review of Systems  Constitutional: Positive for activity change,  chills, fatigue and fever.  HENT: Positive for congestion, postnasal drip, sinus pressure and sinus pain.   Respiratory: Positive for cough and shortness of breath.   Cardiovascular: Negative.  Negative for chest pain, palpitations and leg swelling.  Gastrointestinal: Negative.   Allergic/Immunologic: Negative.   Neurological: Negative.   Psychiatric/Behavioral: Negative.        Physical Exam  BP (!) 96/40 (BP Location: Left Arm, Patient Position: Sitting, Cuff Size: Normal)   Pulse 87   Temp 98.8 F (37.1 C)   Ht 5\' 2"  (1.575 m)   Wt 129 lb (58.5 kg) Comment: per patient, too weak to get on scale  SpO2 96%   BMI 23.59 kg/m   Wt  Readings from Last 5 Encounters:  06/17/18 129 lb (58.5 kg)  04/04/18 134 lb (60.8 kg)  11/28/17 129 lb (58.5 kg)  09/17/17 134 lb 9.6 oz (61.1 kg)  09/11/17 134 lb (60.8 kg)     Physical Exam Vitals signs and nursing note reviewed.  Constitutional:      General: She is in acute distress.  Cardiovascular:     Rate and Rhythm: Normal rate and regular rhythm.  Pulmonary:     Effort: Pulmonary effort is normal.     Breath sounds: Normal breath sounds.  Musculoskeletal:        General: No swelling.  Neurological:     Mental Status: She is oriented to person, place, and time. She is lethargic.        Assessment & Plan:   Jaw pain Patient Instructions  Patient is having active jaw pain which is indicated MI in the past she is hypotensive in the office today. Her flu swab was negative. We advise for her to go to the ED and she agrees. EMS called to transport.       Fenton Foy, NP 06/17/2018

## 2018-06-17 NOTE — ED Notes (Signed)
IV team bedside. 

## 2018-06-17 NOTE — Assessment & Plan Note (Signed)
Patient Instructions  Patient is having active jaw pain which is indicated MI in the past she is hypotensive in the office today. Her flu swab was negative. We advise for her to go to the ED and she agrees. EMS called to transport.

## 2018-06-18 ENCOUNTER — Ambulatory Visit: Payer: Medicare Other | Admitting: Pulmonary Disease

## 2018-06-18 ENCOUNTER — Other Ambulatory Visit: Payer: Self-pay

## 2018-06-18 ENCOUNTER — Observation Stay (HOSPITAL_BASED_OUTPATIENT_CLINIC_OR_DEPARTMENT_OTHER): Payer: Medicare Other

## 2018-06-18 DIAGNOSIS — R9431 Abnormal electrocardiogram [ECG] [EKG]: Secondary | ICD-10-CM | POA: Diagnosis not present

## 2018-06-18 DIAGNOSIS — E119 Type 2 diabetes mellitus without complications: Secondary | ICD-10-CM | POA: Diagnosis not present

## 2018-06-18 DIAGNOSIS — R0602 Shortness of breath: Secondary | ICD-10-CM | POA: Diagnosis not present

## 2018-06-18 DIAGNOSIS — I10 Essential (primary) hypertension: Secondary | ICD-10-CM | POA: Diagnosis present

## 2018-06-18 DIAGNOSIS — J189 Pneumonia, unspecified organism: Secondary | ICD-10-CM | POA: Diagnosis not present

## 2018-06-18 DIAGNOSIS — F329 Major depressive disorder, single episode, unspecified: Secondary | ICD-10-CM | POA: Diagnosis not present

## 2018-06-18 DIAGNOSIS — I272 Pulmonary hypertension, unspecified: Secondary | ICD-10-CM | POA: Diagnosis present

## 2018-06-18 DIAGNOSIS — J438 Other emphysema: Secondary | ICD-10-CM | POA: Diagnosis not present

## 2018-06-18 DIAGNOSIS — J439 Emphysema, unspecified: Secondary | ICD-10-CM | POA: Diagnosis present

## 2018-06-18 DIAGNOSIS — K219 Gastro-esophageal reflux disease without esophagitis: Secondary | ICD-10-CM | POA: Diagnosis present

## 2018-06-18 DIAGNOSIS — R0789 Other chest pain: Secondary | ICD-10-CM

## 2018-06-18 DIAGNOSIS — R079 Chest pain, unspecified: Secondary | ICD-10-CM | POA: Diagnosis present

## 2018-06-18 DIAGNOSIS — I361 Nonrheumatic tricuspid (valve) insufficiency: Secondary | ICD-10-CM

## 2018-06-18 DIAGNOSIS — Z794 Long term (current) use of insulin: Secondary | ICD-10-CM

## 2018-06-18 DIAGNOSIS — E876 Hypokalemia: Secondary | ICD-10-CM | POA: Diagnosis present

## 2018-06-18 DIAGNOSIS — M349 Systemic sclerosis, unspecified: Secondary | ICD-10-CM | POA: Diagnosis present

## 2018-06-18 DIAGNOSIS — M797 Fibromyalgia: Secondary | ICD-10-CM | POA: Diagnosis present

## 2018-06-18 DIAGNOSIS — G4700 Insomnia, unspecified: Secondary | ICD-10-CM | POA: Diagnosis present

## 2018-06-18 DIAGNOSIS — R6884 Jaw pain: Secondary | ICD-10-CM | POA: Diagnosis present

## 2018-06-18 DIAGNOSIS — I7300 Raynaud's syndrome without gangrene: Secondary | ICD-10-CM | POA: Diagnosis present

## 2018-06-18 DIAGNOSIS — I248 Other forms of acute ischemic heart disease: Secondary | ICD-10-CM | POA: Diagnosis present

## 2018-06-18 DIAGNOSIS — A419 Sepsis, unspecified organism: Secondary | ICD-10-CM | POA: Diagnosis not present

## 2018-06-18 DIAGNOSIS — D899 Disorder involving the immune mechanism, unspecified: Secondary | ICD-10-CM | POA: Diagnosis present

## 2018-06-18 DIAGNOSIS — I2510 Atherosclerotic heart disease of native coronary artery without angina pectoris: Secondary | ICD-10-CM | POA: Diagnosis present

## 2018-06-18 DIAGNOSIS — M3500 Sicca syndrome, unspecified: Secondary | ICD-10-CM | POA: Diagnosis present

## 2018-06-18 DIAGNOSIS — R339 Retention of urine, unspecified: Secondary | ICD-10-CM | POA: Diagnosis present

## 2018-06-18 DIAGNOSIS — H548 Legal blindness, as defined in USA: Secondary | ICD-10-CM | POA: Diagnosis present

## 2018-06-18 DIAGNOSIS — E039 Hypothyroidism, unspecified: Secondary | ICD-10-CM | POA: Diagnosis not present

## 2018-06-18 DIAGNOSIS — R54 Age-related physical debility: Secondary | ICD-10-CM | POA: Diagnosis present

## 2018-06-18 DIAGNOSIS — I959 Hypotension, unspecified: Secondary | ICD-10-CM | POA: Diagnosis present

## 2018-06-18 LAB — GLUCOSE, CAPILLARY
Glucose-Capillary: 89 mg/dL (ref 70–99)
Glucose-Capillary: 96 mg/dL (ref 70–99)
Glucose-Capillary: 170 mg/dL — ABNORMAL HIGH (ref 70–99)

## 2018-06-18 LAB — CBC
HCT: 31.8 % — ABNORMAL LOW (ref 36.0–46.0)
Hemoglobin: 10.1 g/dL — ABNORMAL LOW (ref 12.0–15.0)
MCH: 27 pg (ref 26.0–34.0)
MCHC: 31.8 g/dL (ref 30.0–36.0)
MCV: 85 fL (ref 80.0–100.0)
Platelets: 161 10*3/uL (ref 150–400)
RBC: 3.74 MIL/uL — ABNORMAL LOW (ref 3.87–5.11)
RDW: 13.7 % (ref 11.5–15.5)
WBC: 14.1 10*3/uL — ABNORMAL HIGH (ref 4.0–10.5)
nRBC: 0 % (ref 0.0–0.2)

## 2018-06-18 LAB — URINALYSIS, ROUTINE W REFLEX MICROSCOPIC
Bilirubin Urine: NEGATIVE
Glucose, UA: NEGATIVE mg/dL
Hgb urine dipstick: NEGATIVE
Ketones, ur: NEGATIVE mg/dL
Leukocytes, UA: NEGATIVE
Nitrite: NEGATIVE
Protein, ur: NEGATIVE mg/dL
Specific Gravity, Urine: 1.01 (ref 1.005–1.030)
pH: 6 (ref 5.0–8.0)

## 2018-06-18 LAB — ECHOCARDIOGRAM COMPLETE
Height: 62 in
Weight: 2296.31 oz

## 2018-06-18 LAB — HEMOGLOBIN A1C
Hgb A1c MFr Bld: 7.2 % — ABNORMAL HIGH (ref 4.8–5.6)
Mean Plasma Glucose: 159.94 mg/dL

## 2018-06-18 LAB — TROPONIN I
Troponin I: <0.03 ng/mL (ref <0.03)
Troponin I: <0.03 ng/mL (ref <0.03)

## 2018-06-18 LAB — MRSA PCR SCREENING: MRSA by PCR: NEGATIVE

## 2018-06-18 MED ORDER — ALBUTEROL SULFATE (2.5 MG/3ML) 0.083% IN NEBU: 2.5000 mg | INHALATION_SOLUTION | RESPIRATORY_TRACT | Status: DC | PRN

## 2018-06-18 MED ORDER — PANTOPRAZOLE SODIUM 40 MG PO TBEC
40.0000 mg | DELAYED_RELEASE_TABLET | Freq: Every day | ORAL | Status: DC
  Administered 2018-06-19 – 2018-06-24 (×6): 40 mg via ORAL
  Filled 2018-06-18 (×6): qty 1

## 2018-06-18 MED ORDER — POTASSIUM CHLORIDE CRYS ER 20 MEQ PO TBCR
40.0000 meq | EXTENDED_RELEASE_TABLET | Freq: Once | ORAL | Status: AC
  Administered 2018-06-18: 40 meq via ORAL
  Filled 2018-06-18: qty 2

## 2018-06-18 MED ORDER — ARFORMOTEROL TARTRATE 15 MCG/2ML IN NEBU
15.0000 ug | INHALATION_SOLUTION | Freq: Two times a day (BID) | RESPIRATORY_TRACT | Status: DC
  Administered 2018-06-18 – 2018-06-24 (×12): 15 ug via RESPIRATORY_TRACT
  Filled 2018-06-18 (×14): qty 2

## 2018-06-18 MED ORDER — PANTOPRAZOLE SODIUM 40 MG PO PACK: 40.0000 mg | PACK | Freq: Every day | ORAL | Status: DC

## 2018-06-18 MED ORDER — GUAIFENESIN ER 600 MG PO TB12
600.0000 mg | ORAL_TABLET | Freq: Two times a day (BID) | ORAL | Status: DC
  Administered 2018-06-18 – 2018-06-24 (×13): 600 mg via ORAL
  Filled 2018-06-18 (×13): qty 1

## 2018-06-18 NOTE — Progress Notes (Signed)
Troponins negative x 3 overnight - suspect pain more related to multi-focal PNA. Will follow-up on echo. [place] for aspirin, but no indication for heparin. Can probably consider outpatient stress testing for abnormal EKG when pneumonia resolved.  Pixie Casino, MD, [facility], [place] Director of the Advanced Lipid Disorders &  Cardiovascular Risk Reduction Clinic Diplomate of the American Board of Clinical Lipidology Attending Cardiologist  Direct Dial: [phone]  Fax: [phone]  Website:  [email or web address]

## 2018-06-18 NOTE — Progress Notes (Addendum)
PROGRESS NOTE        PATIENT DETAILS Name: [name] Age: 84 y.o. Sex: female Date of Birth: [birth year] Admit Date: 06/17/2018 Admitting Physician Shela Leff, MD [id number], Mauro Kaufmann, MD  Brief Narrative: Patient is a 84 y.o. female with history of COPD on chronic prednisone, CAD status post PCI in the past, DM-2, hypothyroidism, GERD, who presented to the hospital with left shoulder pain, jaw pain-she also reported generalized weakness and worsening of her chronic shortness of breath.  She was found to have multifocal pneumonia, elevated troponins-likely demand ischemia-she was subsequently admitted to the hospitalist service.  See below for further details  Subjective: Lying comfortably in bed-continues to complain of some cough-breathing is better.  No chest pain this morning.  Assessment/Plan: Sepsis secondary to multifocal pneumonia: Sepsis pathophysiology has resolved-she is overall clinically improved-afebrile with downtrending leukocytosis (14 K today).  However she is very weak and deconditioned.  Blood cultures negative so far, per H&P-influenza PCR was negative at the pulmonologist office.  Continue Rocephin and Zithromax-she seems to be slowly improving-however she is not yet back to her usual baseline with her being frail, immunocompromised-due to being on chronic prednisone-suspect she needs a few more days of hospital stay before being discharged.   Chest pain/jaw pain: Not sure if pain was related to pneumonia-troponins are negative so far-cardiology following.  Transthoracic echocardiogram is pending.  Will await further recommendations from cardiology.  Acute urinary retention: Occurred last night requiring in and out catheterization-have ordered frequent bladder scans-if she continues to have urinary retention-she will likely require Foley catheterization.  Follow closely.  Addendum: had more than 1000 cc of urine retention last  night, urinary retention again reoccurred this morning-more than 700 cc of urine obtained on in and out catheterization.  Patient requires a Foley catheter-order is obtained.  Will require voiding trial in the next few days.  Hypokalemia: Replete and recheck.  COPD: No wheezing-appears stable-continue bronchodilators and chronic prednisone.  DM-2: CBG stable-continue SSI  Depression/insomnia: Stable-continue imipramine and triazolam as needed  Hypothyroidism: Continue Synthroid  GERD: Continue PPI.  Deconditioning/debility: Appears to be frail-and chronically deconditioned at baseline-claims she is much more weak than her usual baseline-suspect acute on chronic deconditioning due to pneumonia and sepsis.  Await input from rehab services.   DVT Prophylaxis: Prophylactic Lovenox   Code Status: Full code  Family Communication: None at bedside  Disposition Plan: Remain inpatient-requires several more days of hospitalization before consideration of discharge  Antimicrobial agents: Anti-infectives (From admission, onward)   Start     Dose/Rate Route Frequency Ordered Stop   06/18/18 1845  azithromycin (ZITHROMAX) 500 mg in sodium chloride 0.9 % 250 mL IVPB     500 mg 250 mL/hr over 60 Minutes Intravenous Every 24 hours 06/17/18 2214     06/18/18 1845  cefTRIAXone (ROCEPHIN) 1 g in sodium chloride 0.9 % 100 mL IVPB     1 g 200 mL/hr over 30 Minutes Intravenous Every 24 hours 06/17/18 2214     06/17/18 1845  cefTRIAXone (ROCEPHIN) 1 g in sodium chloride 0.9 % 100 mL IVPB     1 g 200 mL/hr over 30 Minutes Intravenous  Once 06/17/18 1844 06/17/18 2016   06/17/18 1845  azithromycin (ZITHROMAX) 500 mg in sodium chloride 0.9 % 250 mL IVPB     500 mg 250 mL/hr over 60 Minutes Intravenous  Once 06/17/18 1844 06/17/18 2142      Procedures: None  CONSULTS:  None  Time spent: 25- minutes-Greater than 50% of this time was spent in counseling, explanation of diagnosis, planning of  further management, and coordination of care.  MEDICATIONS: Scheduled Meds: . aspirin EC  81 mg Oral Daily  . enoxaparin (LOVENOX) injection  30 mg Subcutaneous Q24H  . fluticasone  1 spray Each Nare Daily  . imipramine  75 mg Oral QHS  . insulin aspart  0-9 Units Subcutaneous TID WC  . levothyroxine  100 mcg Oral QAC breakfast  . montelukast  10 mg Oral QHS  . pantoprazole sodium  40 mg Per Tube Daily  . predniSONE  5 mg Oral Q breakfast   Continuous Infusions: . azithromycin    . cefTRIAXone (ROCEPHIN)  IV     PRN Meds:.acetaminophen, triazolam   PHYSICAL EXAM: Vital signs: Vitals:   06/17/18 2117 06/17/18 2130 06/17/18 2223 06/18/18 0354  BP:  140/75 (!) 119/59   Pulse:  81 78   Resp:  17 17 20   Temp: 98.3 F (36.8 C)  98.3 F (36.8 C) (!) 97.5 F (36.4 C)  TempSrc: Oral  Oral Oral  SpO2:  99% 95%   Weight:   63.2 kg 65.1 kg  Height:   5\' 2"  (1.575 m)    Filed Weights   06/17/18 1719 06/17/18 2223 06/18/18 0354  Weight: 59 kg 63.2 kg 65.1 kg   Body mass index is 26.25 kg/m.   General appearance :Awake, alert, not in any distress.  Chronically sick appearing Eyes:Pink conjunctiva HEENT: Atraumatic and Normocephalic Neck: supple Resp:Good air entry bilaterally, few scattered rhonchi CVS: S1 S2 regular, no murmurs.  GI: Bowel sounds present, Non tender and not distended with no gaurding, rigidity or rebound.No organomegaly Extremities: B/L Lower Ext shows no edema, both legs are warm to touch Neurology: Nonfocal but appears to have generalized weakness Psychiatric: Normal judgment and insight. Alert and oriented x 3. Normal mood. Musculoskeletal:No digital cyanosis Skin:No Rash, warm and dry Wounds:N/A  I have personally reviewed following labs and imaging studies  LABORATORY DATA: CBC: Recent Labs  Lab 06/17/18 1840 06/18/18 0305  WBC 21.5* 14.1*  NEUTROABS 18.2*  --   HGB 12.3 10.1*  HCT 38.8 31.8*  MCV 86.0 85.0  PLT 191 161    Basic  Metabolic Panel: Recent Labs  Lab 06/17/18 1840  NA 133*  K 3.3*  CL 95*  CO2 25  GLUCOSE 184*  BUN 25*  CREATININE 1.06*  CALCIUM 8.7*    GFR: Estimated Creatinine Clearance: 34.4 mL/min (A) (by C-G formula based on SCr of 1.06 mg/dL (H)).  Liver Function Tests: Recent Labs  Lab 06/17/18 1840  AST 20  ALT 15  ALKPHOS 43  BILITOT 1.0  PROT 6.3*  ALBUMIN 3.3*   No results for input(s): LIPASE, AMYLASE in the last 168 hours. No results for input(s): AMMONIA in the last 168 hours.  Coagulation Profile: No results for input(s): INR, PROTIME in the last 168 hours.  Cardiac Enzymes: Recent Labs  Lab 06/17/18 2116 06/18/18 0305  TROPONINI <0.03 <0.03    BNP (last 3 results) No results for input(s): PROBNP in the last 8760 hours.  HbA1C: Recent Labs    06/18/18 0305  HGBA1C 7.2*    CBG: Recent Labs  Lab 06/17/18 1724 06/17/18 2219 06/18/18 0731  GLUCAP 143* 121* 89    Lipid Profile: No results for input(s): CHOL, HDL, LDLCALC, TRIG, CHOLHDL, LDLDIRECT in  the last 72 hours.  Thyroid Function Tests: No results for input(s): TSH, T4TOTAL, FREET4, T3FREE, THYROIDAB in the last 72 hours.  Anemia Panel: No results for input(s): VITAMINB12, FOLATE, FERRITIN, TIBC, IRON, RETICCTPCT in the last 72 hours.  Urine analysis:    Component Value Date/Time   COLORURINE YELLOW 06/18/2018 0225   APPEARANCEUR CLEAR 06/18/2018 0225   LABSPEC 1.010 06/18/2018 0225   PHURINE 6.0 06/18/2018 0225   GLUCOSEU NEGATIVE 06/18/2018 0225   GLUCOSEU NEGATIVE 03/24/2010 1137   HGBUR NEGATIVE 06/18/2018 0225   BILIRUBINUR NEGATIVE 06/18/2018 0225   KETONESUR NEGATIVE 06/18/2018 0225   PROTEINUR NEGATIVE 06/18/2018 0225   UROBILINOGEN 0.2 05/01/2015 2019   NITRITE NEGATIVE 06/18/2018 0225   LEUKOCYTESUR NEGATIVE 06/18/2018 0225    Sepsis Labs: Lactic Acid, Venous    Component Value Date/Time   LATICACIDVEN 1.80 06/17/2018 1856    MICROBIOLOGY: Recent Results  (from the past 240 hour(s))  Culture, blood (routine x 2)     Status: None (Preliminary result)   Collection Time: 06/17/18  6:40 PM  Result Value Ref Range Status   Specimen Description BLOOD RIGHT ARM  Final   Special Requests   Final    BOTTLES DRAWN AEROBIC ONLY Blood Culture results may not be optimal due to an inadequate volume of blood received in culture bottles   Culture   Final    NO GROWTH < 12 HOURS Performed at [facility] [address] [place], [place] [address]    Report Status PENDING  Incomplete  Culture, blood (routine x 2)     Status: None (Preliminary result)   Collection Time: 06/17/18  6:40 PM  Result Value Ref Range Status   Specimen Description BLOOD LEFT ARM  Final   Special Requests   Final    BOTTLES DRAWN AEROBIC ONLY Blood Culture adequate volume   Culture   Final    NO GROWTH < 12 HOURS Performed at [facility], [place] [address], [place], [place] [address]    Report Status PENDING  Incomplete  MRSA PCR Screening     Status: None   Collection Time: 06/18/18  5:07 AM  Result Value Ref Range Status   MRSA by PCR NEGATIVE NEGATIVE Final    Comment:        The GeneXpert MRSA Assay (FDA approved for NASAL specimens only), is one component of a comprehensive MRSA colonization surveillance program. It is not intended to diagnose MRSA infection nor to guide or monitor treatment for MRSA infections. Performed at [facility], [place] [address] [place], [place] [address]     RADIOLOGY STUDIES/RESULTS: Dg Chest 2 View  Result Date: 06/17/2018 CLINICAL DATA:  Dyspnea with productive cough and fever. EXAM: CHEST - 2 VIEW COMPARISON:  07/13/2017 FINDINGS: Heart size and mediastinal contours are stable with aortic atherosclerosis. Surgical clips project over the base of the neck on the left prior left thyroid lobectomy. Asymmetric airspace opacities are identified about the right hilum, right upper and lower lobe predominant compatible  with multilobar pneumonia, less likely unilateral CHF. Left lung remains relatively clear. No acute osseous abnormality. IMPRESSION: Pulmonary opacities about the right hilum, right upper and lower lobes compatible with multilobar pneumonia, less likely unilateral CHF given history of fever and cough. Electronically Signed   By: Ashley Royalty M.D.   On: 06/17/2018 18:25     LOS: 0 days   Oren Binet, MD  [facility]  If 7PM-7AM, please contact night-coverage  Please  page via [email or web address]-Password [email or web address]-click on MD name and type text message  06/18/2018, 10:57 AM

## 2018-06-18 NOTE — Evaluation (Signed)
Physical Therapy Evaluation Patient Details Name: [name] MRN: [id number] DOB: July 04, 1930 Today's Date: 06/18/2018   History of Present Illness  Pt is an 84 y.o. female admitted with L shoulder and jaw pain, also with worsening of chronic SOB and weakness. Worked up for sepsis secondary to multifocal PNA; troponins elevated, likely demand ischemia. PMH includes COPD (on chronic prednisone), DM2, depression.    Clinical Impression  Pt presents with an overall decrease in functional mobility secondary to above. PTA, pt indep and lives alone; does not drive secondary to legally blind, uses family and [employer] for transportation. Today, pt able to amb short distance with min guard for balance. Pt motivated to return home and declining SNF-level therapies. Pt would benefit from continued acute PT services to maximize functional mobility and independence prior to d/c with HHPT services.     Follow Up Recommendations Home health PT;Supervision - Intermittent(declined SNF)    Equipment Recommendations  None recommended by PT    Recommendations for Other Services       Precautions / Restrictions Precautions Precautions: Fall Restrictions Weight Bearing Restrictions: No      Mobility  Bed Mobility Overal bed mobility: Independent                Transfers Overall transfer level: Needs assistance Equipment used: None Transfers: Sit to/from Stand Sit to Stand: Supervision            Ambulation/Gait Ambulation/Gait assistance: Min guard Gait Distance (Feet): 4 Feet     Gait velocity: Decreased   General Gait Details: Amb from bed to recliner with close min guard for balance; pt declining further distance secondary to wanting to eat breakfast. SpO2 94% on RA  Stairs            Wheelchair Mobility    Modified Rankin (Stroke Patients Only)       Balance Overall balance assessment: Needs assistance   Sitting balance-Leahy Scale: Good       Standing  balance-Leahy Scale: Fair                               Pertinent Vitals/Pain Pain Assessment: No/denies pain    Home Living Family/patient expects to be discharged to:: Private residence Living Arrangements: Alone Available Help at Discharge: Family;Neighbor;Available PRN/intermittently Type of Home: House Home Access: Level entry     Home Layout: Two level;Able to live on main level with bedroom/bathroom Home Equipment: Walker - 4 wheels;Grab bars - toilet;Grab bars - tub/shower;Shower seat Additional Comments: stays on first floor    Prior Function Level of Independence: Independent         Comments: independent with mobility and ADLs, doesn't drive secondary to poor vision. Daughter and neighbor assist with errands; sometimes uses [employer] for transportation. Watches great-grandkids every Friday     Hand Dominance   Dominant Hand: Right    Extremity/Trunk Assessment   Upper Extremity Assessment Upper Extremity Assessment: Overall WFL for tasks assessed    Lower Extremity Assessment Lower Extremity Assessment: Generalized weakness       Communication   Communication: No difficulties  Cognition Arousal/Alertness: Awake/alert Behavior During Therapy: WFL for tasks assessed/performed Overall Cognitive Status: Within Functional Limits for tasks assessed  General Comments General comments (skin integrity, edema, etc.): Pt declining SNF-level therapies; agreeable to HHPT    Exercises     Assessment/Plan    PT Assessment Patient needs continued PT services  PT Problem List Decreased strength;Decreased activity tolerance;Decreased balance;Decreased mobility       PT Treatment Interventions DME instruction;Gait training;Stair training;Functional mobility training;Therapeutic activities;Therapeutic exercise;Balance training;Patient/family education    PT Goals (Current goals can be found in the  Care Plan section)  Acute Rehab PT Goals Patient Stated Goal: Get out of this bed more often; return home PT Goal Formulation: With patient Time For Goal Achievement: 07/02/18 Potential to Achieve Goals: Good    Frequency Min 3X/week   Barriers to discharge Decreased caregiver support      Co-evaluation               AM-PAC PT "6 Clicks" Mobility  Outcome Measure Help needed turning from your back to your side while in a flat bed without using bedrails?: None Help needed moving from lying on your back to sitting on the side of a flat bed without using bedrails?: None Help needed moving to and from a bed to a chair (including a wheelchair)?: A Little Help needed standing up from a chair using your arms (e.g., wheelchair or bedside chair)?: A Little Help needed to walk in hospital room?: A Little Help needed climbing 3-5 steps with a railing? : A Little 6 Click Score: 20    End of Session   Activity Tolerance: Patient tolerated treatment well Patient left: in chair;with call bell/phone within reach;with chair alarm set Nurse Communication: Mobility status PT Visit Diagnosis: Other abnormalities of gait and mobility (R26.89);Muscle weakness (generalized) (M62.81)    Time: 6644-0347 PT Time Calculation (min) (ACUTE ONLY): 20 min   Charges:   PT Evaluation $PT Eval Moderate Complexity: [place], PT, DPT Acute Rehabilitation Services  Pager [phone] Office [place] 06/18/2018, 11:42 AM

## 2018-06-18 NOTE — Progress Notes (Signed)
Patient has not voided since arriving at the hospital, was given 2 liters of IVF's in ED and started on NS at 125cc/hr at 22:30pm, still no urine outpt even with a purewich in, checked the suction to make sure everything was working and then place patient on the bedpan for a little bit. I bladder scanned her for 999 ml x 3, still unable to void, received an order from K. Schorr to straight cath her and was able to get 975cc clear yellow urine, specimen sent to lab. Patient tolerated procedure well and states that she feels much better, will continue to monitor, purewich reinserted at this time.

## 2018-06-18 NOTE — Progress Notes (Signed)
Reviewed and agree with assessment/plan.   Amberrose Friebel, MD [facility] 06/21/2016, 12:24 PM Pager:  [phone]  

## 2018-06-18 NOTE — Care Management Obs Status (Signed)
[place] NOTIFICATION   Patient Details  Name: [name] MRN: [id number] Date of Birth: Jan 27, 1931   Medicare Observation Status Notification Given:  Yes    Bethena Roys, RN 06/18/2018, 2:09 PM

## 2018-06-18 NOTE — Progress Notes (Signed)
  Echocardiogram 2D Echocardiogram has been performed.  [name] 06/18/2018, 9:02 AM

## 2018-06-18 NOTE — Progress Notes (Signed)
Physical Therapy Treatment Patient Details Name: [name] MRN: [id number] DOB: [birth year] Today's Date: 06/18/2018    History of Present Illness Pt is an 84 y.o. female admitted with L shoulder and jaw pain, also with worsening of chronic SOB and weakness. Worked up for sepsis secondary to multifocal PNA; troponins elevated, likely demand ischemia. PMH includes legally blind, COPD (on chronic prednisone), DM2, depression.   PT Comments    Pt seen for second session, not agreeable to hallway ambulation having eat breakfast. Pt ambulatory without DME, requiring close min guard for balance; 1x self-corrected LOB by reaching to wall for support. Discussed use of SPC or RW for added stability, pt unsure she is willing to use DME. Although pt legally blind, she navigates hallway well. Recommend supervision for continued ambulation. Will follow acutely.  SpO2 down to 88% on RA with ambulation, returning to 100% at rest Post-mobility BP 137/58   Follow Up Recommendations  Home health PT;Supervision - Intermittent(declined SNF)     Equipment Recommendations  None recommended by PT    Recommendations for Other Services       Precautions / Restrictions Precautions Precautions: Fall Restrictions Weight Bearing Restrictions: No    Mobility  Bed Mobility Overal bed mobility: Independent             General bed mobility comments: Received sitting in recliner  Transfers Overall transfer level: Needs assistance Equipment used: None Transfers: Sit to/from Stand Sit to Stand: Supervision         General transfer comment: Reaching to UE support on table upon standing and waiting for gown to be donned  Ambulation/Gait Ambulation/Gait assistance: Min guard Gait Distance (Feet): 200 Feet Assistive device: None Gait Pattern/deviations: Step-through pattern;Decreased stride length Gait velocity: Decreased Gait velocity interpretation: 1.31 - 2.62 ft/sec, indicative of  limited community ambulator General Gait Details: Slow, mostly steady ambulation in hallway with min guard for balance. Pt with 1x LOB upon reentering room, self-corrected by stabilizing self on doorframe. C/o chronic hip pain; recommended use of RW for added stability and decrease WB through hip    Stairs             Wheelchair Mobility    Modified Rankin (Stroke Patients Only)       Balance Overall balance assessment: Needs assistance   Sitting balance-Leahy Scale: Good       Standing balance-Leahy Scale: Fair                              Cognition Arousal/Alertness: Awake/alert Behavior During Therapy: WFL for tasks assessed/performed Overall Cognitive Status: Within Functional Limits for tasks assessed                                        Exercises      General Comments General comments (skin integrity, edema, etc.): SpO2 88-100% on RA. BP 137/58 upon return to room      Pertinent Vitals/Pain Pain Assessment: No/denies pain    Home Living Family/patient expects to be discharged to:: Private residence Living Arrangements: Alone Available Help at Discharge: Family;Neighbor;Available PRN/intermittently Type of Home: House Home Access: Level entry   Home Layout: Two level;Able to live on main level with bedroom/bathroom Home Equipment: Walker - 4 wheels;Grab bars - toilet;Grab bars - tub/shower;Shower seat Additional Comments: stays on first floor    Prior Function  Level of Independence: Independent      Comments: independent with mobility and ADLs, doesn't drive secondary to poor vision. Daughter and neighbor assist with errands; sometimes uses [employer] for transportation. Watches great-grandkids every Friday   PT Goals (current goals can now be found in the care plan section) Acute Rehab PT Goals Patient Stated Goal: Get out of this bed more often; return home PT Goal Formulation: With patient Time For Goal Achievement:  07/02/18 Potential to Achieve Goals: Good Progress towards PT goals: Progressing toward goals    Frequency    Min 3X/week      PT Plan Current plan remains appropriate    Co-evaluation              AM-PAC PT "6 Clicks" Mobility   Outcome Measure  Help needed turning from your back to your side while in a flat bed without using bedrails?: None Help needed moving from lying on your back to sitting on the side of a flat bed without using bedrails?: None Help needed moving to and from a bed to a chair (including a wheelchair)?: A Little Help needed standing up from a chair using your arms (e.g., wheelchair or bedside chair)?: A Little Help needed to walk in hospital room?: A Little Help needed climbing 3-5 steps with a railing? : A Little 6 Click Score: 20    End of Session Equipment Utilized During Treatment: Gait belt Activity Tolerance: Patient tolerated treatment well Patient left: in chair;with call bell/phone within reach;with chair alarm set Nurse Communication: Mobility status PT Visit Diagnosis: Other abnormalities of gait and mobility (R26.89);Muscle weakness (generalized) (M62.81)     Time: 6734-1937 PT Time Calculation (min) (ACUTE ONLY): 18 min  Charges:  $Gait Training: 8-22 mins                    Mabeline Caras, PT, DPT Acute Rehabilitation Services  Pager [phone] Office [place] 06/18/2018, 11:49 AM

## 2018-06-18 NOTE — Care Management Note (Signed)
Case Management Note  Patient Details  Name: THELDA GAGAN MRN: [id number] Date of Birth: March 24, 1931  Subjective/Objective:   Pt presented for SOB and weakness. Pt is legally blind and home alone. Patient states she has support of daughter. Neighbors and friends assist with transportation and to Yoga. Pt is active with [facility] (Palliative Services). PT recommendations for [facility] PT- pt is agreeable to services.                  Action/Plan: CM did make referral with [facility] and [facility] to begin within 24-48 hours post transition home. No further needs from CM at this time.   Expected Discharge Date:                  Expected Discharge Plan:  [place]  In-House Referral:  NA  Discharge planning Services  CM Consult  Post Acute Care Choice:  Home Health Choice offered to:  Patient  DME Arranged:  N/A DME Agency:  NA  HH Arranged:  PT [place] Agency:  [place]  Status of Service:  Completed, signed off  If discussed at [place] of Stay Meetings, dates discussed:    Additional Comments:  Bethena Roys, RN 06/18/2018, 2:16 PM

## 2018-06-18 NOTE — Progress Notes (Signed)
Bladder scan showed volume of 812 mL; In and out cath with 750 mL out at this time; post void residual 35 mL. Patient did attempt to void on commode prior to in and out cath and was unable to do so. Dr. Sloan Leiter made aware. Patient has required in and out cath twice with volumes of greater than 750 mL both times, so he stated to place Foley catheter now. Informed him of hospital policy to in and out cath three times prior to Foley insertion; MD stated to place Foley now due to acute illness and high volumes on bladder scan. Will carry out MD order.

## 2018-06-19 DIAGNOSIS — A419 Sepsis, unspecified organism: Principal | ICD-10-CM

## 2018-06-19 LAB — BASIC METABOLIC PANEL
Anion gap: 12 (ref 5–15)
BUN: 10 mg/dL (ref 8–23)
CO2: 19 mmol/L — ABNORMAL LOW (ref 22–32)
Calcium: 7.9 mg/dL — ABNORMAL LOW (ref 8.9–10.3)
Chloride: 104 mmol/L (ref 98–111)
Creatinine, Ser: 0.67 mg/dL (ref 0.44–1.00)
GFR calc Af Amer: >60 mL/min (ref >60)
GFR calc non Af Amer: >60 mL/min (ref >60)
Glucose, Bld: 114 mg/dL — ABNORMAL HIGH (ref 70–99)
Potassium: 3.2 mmol/L — ABNORMAL LOW (ref 3.5–5.1)
Sodium: 135 mmol/L (ref 135–145)

## 2018-06-19 LAB — CBC
HCT: 32.1 % — ABNORMAL LOW (ref 36.0–46.0)
Hemoglobin: 10.2 g/dL — ABNORMAL LOW (ref 12.0–15.0)
MCH: 26.7 pg (ref 26.0–34.0)
MCHC: 31.8 g/dL (ref 30.0–36.0)
MCV: 84 fL (ref 80.0–100.0)
Platelets: 150 10*3/uL (ref 150–400)
RBC: 3.82 MIL/uL — ABNORMAL LOW (ref 3.87–5.11)
RDW: 13.5 % (ref 11.5–15.5)
WBC: 11.9 10*3/uL — ABNORMAL HIGH (ref 4.0–10.5)
nRBC: 0 % (ref 0.0–0.2)

## 2018-06-19 LAB — GLUCOSE, CAPILLARY
Glucose-Capillary: 99 mg/dL (ref 70–99)
Glucose-Capillary: 123 mg/dL — ABNORMAL HIGH (ref 70–99)
Glucose-Capillary: 204 mg/dL — ABNORMAL HIGH (ref 70–99)
Glucose-Capillary: 194 mg/dL — ABNORMAL HIGH (ref 70–99)

## 2018-06-19 MED ORDER — BISACODYL 5 MG PO TBEC: 5.0000 mg | DELAYED_RELEASE_TABLET | Freq: Every day | ORAL | Status: DC | PRN

## 2018-06-19 MED ORDER — ENOXAPARIN SODIUM 40 MG/0.4ML ~~LOC~~ SOLN
40.0000 mg | SUBCUTANEOUS | Status: DC
  Administered 2018-06-19 – 2018-06-23 (×5): 40 mg via SUBCUTANEOUS
  Filled 2018-06-19 (×5): qty 0.4

## 2018-06-19 MED ORDER — POTASSIUM CHLORIDE CRYS ER 20 MEQ PO TBCR
40.0000 meq | EXTENDED_RELEASE_TABLET | Freq: Once | ORAL | Status: AC
  Administered 2018-06-19: 40 meq via ORAL
  Filled 2018-06-19: qty 2

## 2018-06-19 NOTE — Progress Notes (Signed)
I have sent a message to our office's scheduling team requesting a follow-up appointment, and our office will call the patient with this information.   

## 2018-06-19 NOTE — Progress Notes (Signed)
PROGRESS NOTE    [name]  LFY:[id number] DOB: Feb 10, 1931 DOA: 06/17/2018 PCP: Merrilee Seashore, MD   Brief Narrative:  HPI 06/17/2018 by Dr. Shela Leff [name] is a 84 y.o. female with medical history significant of COPD, CVA, depression, type 2 diabetes, GERD, hypertension, hypothyroidism, emphysema on chronic prednisone, scleroderma being sent to the hospital from her pulmonologist's office for evaluation of generalized weakness, low blood pressure, and jaw pain. Flu test negative and blood pressure 96/40 recorded at pulmonologist's office.  Patient states she had a fever of 103.6 F this morning and has been feeling fatigued.  States she was in her usual state of health yesterday and even shopped at [employer].  She has been coughing today and having postnasal drainage.  Reports having intermittent substernal chest pressure for the past 3 weeks even at rest which is associated with shortness of breath.  No associated nausea or diaphoresis.  Reports having pain on both sides of her jaw since this morning.  States she has had 4 heart attacks before and 3 stents.  Her cardiologist is Dr. Dorris Carnes.  Interim history Admitted with sepsis secondary to multifocal pneumonia.  Patient was found to have abnormal, suspected to be demand ischemia.  Cardiology consulted and is now signed off.  Assessment & Plan   Sepsis secondary to multifocal pneumonia -Presented with tachypnea, leukocytosis (WBC trending downward) -Chest x-ray reviewed, showing pulmonary opacities in the right upper and lower lobes -Blood cultures negative to date -Influenza PCR negative at pulmonology office per H&P -Continue azithromycin and ceftriaxone  Chest pain/jaw pain -Patient noted to have an abnormal EKG, T wave inversions in the inferior lateral leads -Troponin unremarkable -Patient had stress test in March 2019 which is low risk -Cardiology consulted and appreciated, feels EKG abnormalities  likely due to demand ischemia due to infection and hypotension -Patient has had a history of statin intolerance, continue aspirin -Has had severe hypotension, beta-blockers avoided -Echocardiogram EF 60 to 85%, grade 2 diastolic dysfunction -Cardiology consulted and appreciated, recommending outpatient follow-up with no further ischemic evaluation at this time  Acute urinary retention -Patient has had frequent bladder scans showing urinary retention, and needing in and out catheterization.  Foley catheter has been placed. -Will attempt voiding trial in the next couple of days.  Hypokalemia -Continue to replace and monitor   COPD -Currently without wheezing, appears to be stable -Continue chronic prednisone and bronchodilators  Diabetes mellitus, type II -Continue insulin sliding scale with CBG monitoring -hemoglobin A1c 7.2  Depression/insomnia -Continue imipramine, triazolam as needed  Hypothyroidism  -Continue Synthroid  GERD  -continue PPI  Deconditioning/Debility  -Appears to be frail and chronically deconditioned at baseline however given her current sepsis and pneumonia, appears to be weaker than usual -PT recommending home health as patient declined SNF  DVT Prophylaxis  lovenox  Code Status: Full  Family Communication: None at bedside  Disposition Plan: Admitted. Suspect home with home health in 1-2 days pending further improvement in symptoms  Consultants Cardiology  Procedures  Echocardiogram  Antibiotics   Anti-infectives (From admission, onward)   Start     Dose/Rate Route Frequency Ordered Stop   06/18/18 2000  azithromycin (ZITHROMAX) 500 mg in sodium chloride 0.9 % 250 mL IVPB     500 mg 250 mL/hr over 60 Minutes Intravenous Every 24 hours 06/17/18 2214     06/18/18 1800  cefTRIAXone (ROCEPHIN) 1 g in sodium chloride 0.9 % 100 mL IVPB     1 g 200 mL/hr  over 30 Minutes Intravenous Every 24 hours 06/17/18 2214     06/17/18 1845  cefTRIAXone  (ROCEPHIN) 1 g in sodium chloride 0.9 % 100 mL IVPB     1 g 200 mL/hr over 30 Minutes Intravenous  Once 06/17/18 1844 06/17/18 2016   06/17/18 1845  azithromycin (ZITHROMAX) 500 mg in sodium chloride 0.9 % 250 mL IVPB     500 mg 250 mL/hr over 60 Minutes Intravenous  Once 06/17/18 1844 06/17/18 2142      Subjective:   Allean Found seen and examined today.  Continues to feel weak and have cough. Feels breathing has improved, but not back to her norm. Denies current chest pain, abdominal pain, nausea or vomiting, diarrhea or constipation, dizziness or headache.   Objective:   Vitals:   06/18/18 1951 06/18/18 2003 06/19/18 0418 06/19/18 0827  BP:  (!) 150/63 (!) 145/53   Pulse:  79    Resp:  20    Temp:  98.6 F (37 C) 98.6 F (37 C)   TempSrc:  Oral Oral   SpO2: 95% 91% 94% 95%  Weight:      Height:        Intake/Output Summary (Last 24 hours) at 06/19/2018 1337 Last data filed at 06/19/2018 0900 Gross per 24 hour  Intake 1070 ml  Output 2150 ml  Net -1080 ml   Filed Weights   06/17/18 1719 06/17/18 2223 06/18/18 0354  Weight: 59 kg 63.2 kg 65.1 kg    Exam  General: Well developed, elderly, chronically ill-appearing, NAD  HEENT: NCAT, mucous membranes moist.   Neck: Supple  Cardiovascular: S1 S2 auscultated, no rubs, murmurs or gallops. Regular rate and rhythm.  Respiratory: Diminished breath sounds, +cough  Abdomen: Soft, nontender, nondistended, + bowel sounds  Extremities: warm dry without cyanosis clubbing or edema  Neuro: AAOx3, nonfocal  Psych: Normal affect and demeanor with intact judgement and insight   Data Reviewed: I have personally reviewed following labs and imaging studies  CBC: Recent Labs  Lab 06/17/18 1840 06/18/18 0305 06/19/18 0407  WBC 21.5* 14.1* 11.9*  NEUTROABS 18.2*  --   --   HGB 12.3 10.1* 10.2*  HCT 38.8 31.8* 32.1*  MCV 86.0 85.0 84.0  PLT 191 161 998   Basic Metabolic Panel: Recent Labs  Lab 06/17/18 1840  06/19/18 0407  NA 133* 135  K 3.3* 3.2*  CL 95* 104  CO2 25 19*  GLUCOSE 184* 114*  BUN 25* 10  CREATININE 1.06* 0.67  CALCIUM 8.7* 7.9*   GFR: Estimated Creatinine Clearance: 45.5 mL/min (by C-G formula based on SCr of 0.67 mg/dL). Liver Function Tests: Recent Labs  Lab 06/17/18 1840  AST 20  ALT 15  ALKPHOS 43  BILITOT 1.0  PROT 6.3*  ALBUMIN 3.3*   No results for input(s): LIPASE, AMYLASE in the last 168 hours. No results for input(s): AMMONIA in the last 168 hours. Coagulation Profile: No results for input(s): INR, PROTIME in the last 168 hours. Cardiac Enzymes: Recent Labs  Lab 06/17/18 2116 06/18/18 0305 06/18/18 0931  TROPONINI <0.03 <0.03 <0.03   BNP (last 3 results) No results for input(s): PROBNP in the last 8760 hours. HbA1C: Recent Labs    06/18/18 0305  HGBA1C 7.2*   CBG: Recent Labs  Lab 06/18/18 0731 06/18/18 1108 06/18/18 1736 06/19/18 0757 06/19/18 1115  GLUCAP 89 96 170* 99 123*   Lipid Profile: No results for input(s): CHOL, HDL, LDLCALC, TRIG, CHOLHDL, LDLDIRECT in the last 72 hours.  Thyroid Function Tests: No results for input(s): TSH, T4TOTAL, FREET4, T3FREE, THYROIDAB in the last 72 hours. Anemia Panel: No results for input(s): VITAMINB12, FOLATE, FERRITIN, TIBC, IRON, RETICCTPCT in the last 72 hours. Urine analysis:    Component Value Date/Time   COLORURINE YELLOW 06/18/2018 0225   APPEARANCEUR CLEAR 06/18/2018 0225   LABSPEC 1.010 06/18/2018 0225   PHURINE 6.0 06/18/2018 0225   GLUCOSEU NEGATIVE 06/18/2018 0225   GLUCOSEU NEGATIVE 03/24/2010 1137   HGBUR NEGATIVE 06/18/2018 0225   BILIRUBINUR NEGATIVE 06/18/2018 0225   KETONESUR NEGATIVE 06/18/2018 0225   PROTEINUR NEGATIVE 06/18/2018 0225   UROBILINOGEN 0.2 05/01/2015 2019   NITRITE NEGATIVE 06/18/2018 0225   LEUKOCYTESUR NEGATIVE 06/18/2018 0225   Sepsis Labs: @LABRCNTIP (procalcitonin:4,lacticidven:4)  ) Recent Results (from the past 240 hour(s))  Culture,  blood (routine x 2)     Status: None (Preliminary result)   Collection Time: 06/17/18  6:40 PM  Result Value Ref Range Status   Specimen Description BLOOD RIGHT ARM  Final   Special Requests   Final    BOTTLES DRAWN AEROBIC ONLY Blood Culture results may not be optimal due to an inadequate volume of blood received in culture bottles Performed at [facility] [address], [place], [place] [address]    Culture NO GROWTH 2 DAYS  Final   Report Status PENDING  Incomplete  Culture, blood (routine x 2)     Status: None (Preliminary result)   Collection Time: 06/17/18  6:40 PM  Result Value Ref Range Status   Specimen Description BLOOD LEFT ARM  Final   Special Requests   Final    BOTTLES DRAWN AEROBIC ONLY Blood Culture adequate volume Performed at [facility], [place] [address] [place], [place] [address]    Culture NO GROWTH 2 DAYS  Final   Report Status PENDING  Incomplete  MRSA PCR Screening     Status: None   Collection Time: 06/18/18  5:07 AM  Result Value Ref Range Status   MRSA by PCR NEGATIVE NEGATIVE Final    Comment:        The GeneXpert MRSA Assay (FDA approved for NASAL specimens only), is one component of a comprehensive MRSA colonization surveillance program. It is not intended to diagnose MRSA infection nor to guide or monitor treatment for MRSA infections. Performed at [facility], [place] [address] [place], [place] [address]       Radiology Studies: Dg Chest 2 View  Result Date: 06/17/2018 CLINICAL DATA:  Dyspnea with productive cough and fever. EXAM: CHEST - 2 VIEW COMPARISON:  07/13/2017 FINDINGS: Heart size and mediastinal contours are stable with aortic atherosclerosis. Surgical clips project over the base of the neck on the left prior left thyroid lobectomy. Asymmetric airspace opacities are identified about the right hilum, right upper and lower lobe predominant compatible with multilobar pneumonia, less likely unilateral CHF. Left lung  remains relatively clear. No acute osseous abnormality. IMPRESSION: Pulmonary opacities about the right hilum, right upper and lower lobes compatible with multilobar pneumonia, less likely unilateral CHF given history of fever and cough. Electronically Signed   By: Ashley Royalty M.D.   On: 06/17/2018 18:25     Scheduled Meds: . arformoterol  15 mcg Nebulization BID  . aspirin EC  81 mg Oral Daily  . enoxaparin (LOVENOX) injection  40 mg Subcutaneous Q24H  . fluticasone  1 spray Each Nare Daily  . guaiFENesin  600 mg Oral BID  . imipramine  75 mg  Oral QHS  . insulin aspart  0-9 Units Subcutaneous TID WC  . levothyroxine  100 mcg Oral QAC breakfast  . montelukast  10 mg Oral QHS  . pantoprazole  40 mg Oral Daily  . predniSONE  5 mg Oral Q breakfast   Continuous Infusions: . azithromycin Stopped (06/18/18 2118)  . cefTRIAXone (ROCEPHIN)  IV Stopped (06/18/18 1840)     LOS: 1 day   Time Spent in minutes   30 minutes  Vetra Shinall D.O. on 06/19/2018 at 1:37 PM  Between 7am to 7pm - Please see pager noted on [email or web address]  After 7pm go to [email or web address]  And look for the night coverage person covering for me after hours  [facility] Office  [phone]

## 2018-06-19 NOTE — Progress Notes (Signed)
    [name] is an 8F with COPD on chronic prednisone, CAD status post PCI, diabetes, and GERD here with pneumonia.  Cardiology was consulted for jaw pain.  Cardiac enzymes have been negative.  Echocardiogram from 12/24 was reviewed and showed normal systolic function without wall motion abnormalities.  She had a negative stress test 08/2017.  We will plan for outpatient follow-up and no further ischemic evaluation at this time.  [facility] will sign off.   Medication Recommendations:  none Follow up as an outpatient:  Cardiology will arrange   Audrianna Driskill C. Oval Linsey, MD, [facility] 06/19/2018 8:12 AM

## 2018-06-20 LAB — BASIC METABOLIC PANEL
Anion gap: 10 (ref 5–15)
BUN: 8 mg/dL (ref 8–23)
CO2: 22 mmol/L (ref 22–32)
Calcium: 8.3 mg/dL — ABNORMAL LOW (ref 8.9–10.3)
Chloride: 103 mmol/L (ref 98–111)
Creatinine, Ser: 0.71 mg/dL (ref 0.44–1.00)
GFR calc Af Amer: >60 mL/min (ref >60)
GFR calc non Af Amer: >60 mL/min (ref >60)
Glucose, Bld: 114 mg/dL — ABNORMAL HIGH (ref 70–99)
Potassium: 3.9 mmol/L (ref 3.5–5.1)
Sodium: 135 mmol/L (ref 135–145)

## 2018-06-20 LAB — GLUCOSE, CAPILLARY
Glucose-Capillary: 83 mg/dL (ref 70–99)
Glucose-Capillary: 126 mg/dL — ABNORMAL HIGH (ref 70–99)
Glucose-Capillary: 240 mg/dL — ABNORMAL HIGH (ref 70–99)
Glucose-Capillary: 142 mg/dL — ABNORMAL HIGH (ref 70–99)

## 2018-06-20 NOTE — Progress Notes (Addendum)
Dr. Ree Kida updated.  Foley catheter clamped as per MD order x 2 -3 hours. No c/o bladder urge to void. Bladder scanned as per order and got 602 cc scanned amount in her bladder. Foley cath unclamped and will update Dr. Ree Kida.

## 2018-06-20 NOTE — Progress Notes (Signed)
PROGRESS NOTE    [name]  ZHG:[id number] DOB: Jun 01, 1931 DOA: 06/17/2018 PCP: Merrilee Seashore, MD   Brief Narrative:  HPI 06/17/2018 by Dr. Shela Leff [name] is a 84 y.o. female with medical history significant of COPD, CVA, depression, type 2 diabetes, GERD, hypertension, hypothyroidism, emphysema on chronic prednisone, scleroderma being sent to the hospital from her pulmonologist's office for evaluation of generalized weakness, low blood pressure, and jaw pain. Flu test negative and blood pressure 96/40 recorded at pulmonologist's office.  Patient states she had a fever of 103.6 F this morning and has been feeling fatigued.  States she was in her usual state of health yesterday and even shopped at [employer].  She has been coughing today and having postnasal drainage.  Reports having intermittent substernal chest pressure for the past 3 weeks even at rest which is associated with shortness of breath.  No associated nausea or diaphoresis.  Reports having pain on both sides of her jaw since this morning.  States she has had 4 heart attacks before and 3 stents.  Her cardiologist is Dr. Dorris Carnes.  Interim history Admitted with sepsis secondary to multifocal pneumonia.  Patient was [name] to have abnormal, suspected to be demand ischemia.  Cardiology consulted and is now signed off.  Assessment & Plan   Sepsis secondary to multifocal pneumonia -Presented with tachypnea, leukocytosis (WBC trending downward) -Chest x-ray reviewed, showing pulmonary opacities in the right upper and lower lobes -Blood cultures negative to date -Influenza PCR negative at pulmonology office per H&P -Continue azithromycin and ceftriaxone  Chest pain/jaw pain -Patient noted to have an abnormal EKG, T wave inversions in the inferior lateral leads -Troponin unremarkable -Patient had stress test in March 2019 which is low risk -Cardiology consulted and appreciated, feels EKG abnormalities  likely due to demand ischemia due to infection and hypotension -Patient has had a history of statin intolerance, continue aspirin -Has had severe hypotension, beta-blockers avoided -Echocardiogram EF 60 to 19%, grade 2 diastolic dysfunction -Cardiology consulted and appreciated, recommending outpatient follow-up with no further ischemic evaluation at this time  Acute urinary retention -Patient has had frequent bladder scans showing urinary retention, and needing in and out catheterization.  Foley catheter has been placed. -Will clamp foley today and monitor  Hypokalemia -Continue to replace and monitor   COPD -Currently without wheezing, appears to be stable -Continue chronic prednisone and bronchodilators  Diabetes mellitus, type II -Continue insulin sliding scale with CBG monitoring -hemoglobin A1c 7.2  Depression/insomnia -Continue imipramine, triazolam as needed  Hypothyroidism  -Continue Synthroid  GERD  -continue PPI  Deconditioning/Debility  -Appears to be frail and chronically deconditioned at baseline however given her current sepsis and pneumonia, appears to be weaker than usual -PT recommending home health as patient declined SNF  DVT Prophylaxis  lovenox  Code Status: Full  Family Communication: None at bedside  Disposition Plan: Admitted. Suspect home with home health likely on 06/21/2018, pending further improvement in symptoms  Consultants Cardiology  Procedures  Echocardiogram  Antibiotics   Anti-infectives (From admission, onward)   Start     Dose/Rate Route Frequency Ordered Stop   06/18/18 2000  azithromycin (ZITHROMAX) 500 mg in sodium chloride 0.9 % 250 mL IVPB     500 mg 250 mL/hr over 60 Minutes Intravenous Every 24 hours 06/17/18 2214     06/18/18 1800  cefTRIAXone (ROCEPHIN) 1 g in sodium chloride 0.9 % 100 mL IVPB     1 g 200 mL/hr over 30 Minutes Intravenous  Every 24 hours 06/17/18 2214     06/17/18 1845  cefTRIAXone (ROCEPHIN) 1 g  in sodium chloride 0.9 % 100 mL IVPB     1 g 200 mL/hr over 30 Minutes Intravenous  Once 06/17/18 1844 06/17/18 2016   06/17/18 1845  azithromycin (ZITHROMAX) 500 mg in sodium chloride 0.9 % 250 mL IVPB     500 mg 250 mL/hr over 60 Minutes Intravenous  Once 06/17/18 1844 06/17/18 2142      Subjective:   [name] seen and examined today.  Continues to feel bad.  Elaborates and states she feels weak and tired.  Continues to have cough occasionally.  Feels breathing has improved.  Denies current chest pain, abdominal pain, nausea or vomiting, diarrhea or constipation, dizziness or headache.    Objective:   Vitals:   06/19/18 1454 06/19/18 2052 06/19/18 2125 06/20/18 0734  BP: (!) 143/66 (!) 99/53 121/83   Pulse: 82 76    Resp:  20    Temp: 100.1 F (37.8 C) 98.9 F (37.2 C)    TempSrc: Oral Oral    SpO2: 96% 93%  94%  Weight:      Height:        Intake/Output Summary (Last 24 hours) at 06/20/2018 1253 Last data filed at 06/20/2018 1100 Gross per 24 hour  Intake 480 ml  Output 2300 ml  Net -1820 ml   Filed Weights   06/17/18 1719 06/17/18 2223 06/18/18 0354  Weight: 59 kg 63.2 kg 65.1 kg   Exam  General: Well developed, chronically ill-appearing, elderly, NAD  HEENT: NCAT,  mucous membranes moist.   Neck: Supple  Cardiovascular: S1 S2 auscultated, no murmurs, RRR  Respiratory: Diminished breath sounds  Abdomen: Soft, nontender, nondistended, + bowel sounds  Extremities: warm dry without cyanosis clubbing or edema  Neuro: AAOx3, nonfocal  Psych: Appropriate mood and affect  Data Reviewed: I have personally reviewed following labs and imaging studies  CBC: Recent Labs  Lab 06/17/18 1840 06/18/18 0305 06/19/18 0407  WBC 21.5* 14.1* 11.9*  NEUTROABS 18.2*  --   --   HGB 12.3 10.1* 10.2*  HCT 38.8 31.8* 32.1*  MCV 86.0 85.0 84.0  PLT 191 161 829   Basic Metabolic Panel: Recent Labs  Lab 06/17/18 1840 06/19/18 0407 06/20/18 0326  NA 133*  135 135  K 3.3* 3.2* 3.9  CL 95* 104 103  CO2 25 19* 22  GLUCOSE 184* 114* 114*  BUN 25* 10 8  CREATININE 1.06* 0.67 0.71  CALCIUM 8.7* 7.9* 8.3*   GFR: Estimated Creatinine Clearance: 45.5 mL/min (by C-G formula based on SCr of 0.71 mg/dL). Liver Function Tests: Recent Labs  Lab 06/17/18 1840  AST 20  ALT 15  ALKPHOS 43  BILITOT 1.0  PROT 6.3*  ALBUMIN 3.3*   No results for input(s): LIPASE, AMYLASE in the last 168 hours. No results for input(s): AMMONIA in the last 168 hours. Coagulation Profile: No results for input(s): INR, PROTIME in the last 168 hours. Cardiac Enzymes: Recent Labs  Lab 06/17/18 2116 06/18/18 0305 06/18/18 0931  TROPONINI <0.03 <0.03 <0.03   BNP (last 3 results) No results for input(s): PROBNP in the last 8760 hours. HbA1C: Recent Labs    06/18/18 0305  HGBA1C 7.2*   CBG: Recent Labs  Lab 06/19/18 1115 06/19/18 1727 06/19/18 2054 06/20/18 0736 06/20/18 1124  GLUCAP 123* 204* 194* 83 126*   Lipid Profile: No results for input(s): CHOL, HDL, LDLCALC, TRIG, CHOLHDL, LDLDIRECT in the last  72 hours. Thyroid Function Tests: No results for input(s): TSH, T4TOTAL, FREET4, T3FREE, THYROIDAB in the last 72 hours. Anemia Panel: No results for input(s): VITAMINB12, FOLATE, FERRITIN, TIBC, IRON, RETICCTPCT in the last 72 hours. Urine analysis:    Component Value Date/Time   COLORURINE YELLOW 06/18/2018 0225   APPEARANCEUR CLEAR 06/18/2018 0225   LABSPEC 1.010 06/18/2018 0225   PHURINE 6.0 06/18/2018 0225   GLUCOSEU NEGATIVE 06/18/2018 0225   GLUCOSEU NEGATIVE 03/24/2010 1137   HGBUR NEGATIVE 06/18/2018 0225   BILIRUBINUR NEGATIVE 06/18/2018 0225   KETONESUR NEGATIVE 06/18/2018 0225   PROTEINUR NEGATIVE 06/18/2018 0225   UROBILINOGEN 0.2 05/01/2015 2019   NITRITE NEGATIVE 06/18/2018 0225   LEUKOCYTESUR NEGATIVE 06/18/2018 0225   Sepsis Labs: @LABRCNTIP (procalcitonin:4,lacticidven:4)  ) Recent Results (from the past 240 hour(s))    Culture, blood (routine x 2)     Status: None (Preliminary result)   Collection Time: 06/17/18  6:40 PM  Result Value Ref Range Status   Specimen Description BLOOD RIGHT ARM  Final   Special Requests   Final    BOTTLES DRAWN AEROBIC ONLY Blood Culture results may not be optimal due to an inadequate volume of blood received in culture bottles   Culture   Final    NO GROWTH 3 DAYS Performed at [facility], [place] [address], [place], [place] [address]    Report Status PENDING  Incomplete  Culture, blood (routine x 2)     Status: None (Preliminary result)   Collection Time: 06/17/18  6:40 PM  Result Value Ref Range Status   Specimen Description BLOOD LEFT ARM  Final   Special Requests   Final    BOTTLES DRAWN AEROBIC ONLY Blood Culture adequate volume   Culture   Final    NO GROWTH 3 DAYS Performed at [facility], [address], [place], [place] [address]    Report Status PENDING  Incomplete  MRSA PCR Screening     Status: None   Collection Time: 06/18/18  5:07 AM  Result Value Ref Range Status   MRSA by PCR NEGATIVE NEGATIVE Final    Comment:        The GeneXpert MRSA Assay (FDA approved for NASAL specimens only), is one component of a comprehensive MRSA colonization surveillance program. It is not intended to diagnose MRSA infection nor to guide or monitor treatment for MRSA infections. Performed at [facility], [place] [address] [place], [place] [address]       Radiology Studies: No results [name].   Scheduled Meds: . arformoterol  15 mcg Nebulization BID  . aspirin EC  81 mg Oral Daily  . enoxaparin (LOVENOX) injection  40 mg Subcutaneous Q24H  . fluticasone  1 spray Each Nare Daily  . guaiFENesin  600 mg Oral BID  . imipramine  75 mg Oral QHS  . insulin aspart  0-9 Units Subcutaneous TID WC  . levothyroxine  100 mcg Oral QAC breakfast  . montelukast  10 mg Oral QHS  . pantoprazole  40 mg Oral Daily  . predniSONE  5 mg Oral Q breakfast    Continuous Infusions: . azithromycin 500 mg (06/19/18 2137)  . cefTRIAXone (ROCEPHIN)  IV 1 g (06/19/18 1751)     LOS: 2 days   Time Spent in minutes   30 minutes  Paras Kreider D.O. on 06/20/2018 at 12:53 PM  Between 7am to 7pm - Please see pager noted on [email or web address]  After 7pm go to [email or web address]  And  look for the night coverage person covering for me after hours  [facility] Office  [phone]

## 2018-06-20 NOTE — Progress Notes (Signed)
Physical Therapy Treatment Patient Details Name: [name] MRN: [id number] DOB: 1930/08/20 Today's Date: 06/20/2018    History of Present Illness Pt is an 84 y.o. female admitted with L shoulder and jaw pain, also with worsening of chronic SOB and weakness. Worked up for sepsis secondary to multifocal PNA; troponins elevated, likely demand ischemia. PMH includes legally blind, COPD (on chronic prednisone), DM2, depression.   PT Comments    Pt progressing with mobility. Ambulatory without DME, requiring intermittent min guard for balance; DOE up to 3/4 requiring standing rest breaks, SpO2 87-92% on RA. Pt reluctant to use SPC or RW despite education on their use for added stability and energy conservation. Encouraged pt to communicate with RN/NT when wanting to walk as she did not ambulate yesterday. Will follow acutely.   Follow Up Recommendations  Home health PT;Supervision - Intermittent     Equipment Recommendations  None recommended by PT    Recommendations for Other Services       Precautions / Restrictions Precautions Precautions: Fall Precaution Comments: Watch SpO2 Restrictions Weight Bearing Restrictions: No    Mobility  Bed Mobility Overal bed mobility: Modified Independent                Transfers Overall transfer level: Needs assistance Equipment used: None Transfers: Sit to/from Stand Sit to Stand: Supervision            Ambulation/Gait Ambulation/Gait assistance: Min guard;Supervision Gait Distance (Feet): 250 Feet Assistive device: None Gait Pattern/deviations: Step-through pattern;Decreased stride length;Trunk flexed Gait velocity: Decreased Gait velocity interpretation: 1.31 - 2.62 ft/sec, indicative of limited community ambulator General Gait Details: Slow, mostly steady ambulation in hallway with intermittent min guard for balance. Pt with DOE 3/4 requiring intermittent standing rest breaks with UE support on hallway rail; SpO2  down to 87% on RA, mostly maintaining 89-92%   Stairs             Wheelchair Mobility    Modified Rankin (Stroke Patients Only)       Balance Overall balance assessment: Needs assistance   Sitting balance-Leahy Scale: Good       Standing balance-Leahy Scale: Fair                              Cognition Arousal/Alertness: Awake/alert Behavior During Therapy: WFL for tasks assessed/performed Overall Cognitive Status: Within Functional Limits for tasks assessed                                        Exercises      General Comments        Pertinent Vitals/Pain Pain Assessment: Faces Faces Pain Scale: Hurts a little bit Pain Location: Headache Pain Descriptors / Indicators: Headache Pain Intervention(s): Limited activity within patient's tolerance    Home Living                      Prior Function            PT Goals (current goals can now be found in the care plan section) Acute Rehab PT Goals Patient Stated Goal: Get out of this bed more often; return home Time For Goal Achievement: 07/02/18 Potential to Achieve Goals: Good Progress towards PT goals: Progressing toward goals    Frequency    Min 3X/week      PT Plan Current  plan remains appropriate    Co-evaluation              AM-PAC PT "6 Clicks" Mobility   Outcome Measure  Help needed turning from your back to your side while in a flat bed without using bedrails?: None Help needed moving from lying on your back to sitting on the side of a flat bed without using bedrails?: None Help needed moving to and from a bed to a chair (including a wheelchair)?: A Little Help needed standing up from a chair using your arms (e.g., wheelchair or bedside chair)?: A Little Help needed to walk in hospital room?: A Little Help needed climbing 3-5 steps with a railing? : A Little 6 Click Score: 20    End of Session Equipment Utilized During Treatment: Gait  belt Activity Tolerance: Patient tolerated treatment well Patient left: in chair;with call bell/phone within reach;with chair alarm set Nurse Communication: Mobility status PT Visit Diagnosis: Other abnormalities of gait and mobility (R26.89);Muscle weakness (generalized) (M62.81)     Time: 3299-2426 PT Time Calculation (min) (ACUTE ONLY): 21 min  Charges:  $Gait Training: 8-22 mins                    Mabeline Caras, PT, DPT Acute Rehabilitation Services  Pager [phone] Office [place] 06/20/2018, 11:43 AM

## 2018-06-21 LAB — GLUCOSE, CAPILLARY
Glucose-Capillary: 92 mg/dL (ref 70–99)
Glucose-Capillary: 218 mg/dL — ABNORMAL HIGH (ref 70–99)
Glucose-Capillary: 195 mg/dL — ABNORMAL HIGH (ref 70–99)
Glucose-Capillary: 116 mg/dL — ABNORMAL HIGH (ref 70–99)

## 2018-06-21 MED ORDER — AZITHROMYCIN 250 MG PO TABS
500.0000 mg | ORAL_TABLET | Freq: Once | ORAL | Status: AC
  Administered 2018-06-21: 500 mg via ORAL
  Filled 2018-06-21: qty 2

## 2018-06-21 NOTE — Discharge Summary (Addendum)
Physician Discharge Summary  [name] WFU:[id number] DOB: 1931/02/19 DOA: 06/17/2018  PCP: Renee Seashore, MD  Admit date: 06/17/2018 Discharge date: 06/24/2018  Time spent: 45 minutes  Recommendations for Outpatient Follow-up:  Patient will be discharged to home with home health physical and occupational therapy.  Patient will need to follow up with primary care provider within one week of discharge. Follow up with cardiology and pulmonology. Follow up with urology, Dr. Matilde [name] on 06/25/2018 at 2pm. Patient should continue medications as prescribed.  Patient should follow a care modified diet.   Discharge Diagnoses:  Sepsis secondary to multifocal pneumonia Chest pain/jaw pain Acute urinary retention Hypokalemia COPD Diabetes mellitus, type II Depression/insomnia Hypothyroidism  GERD  Deconditioning/Debility   Discharge Condition: Stable  Diet recommendation: carb modified  Filed Weights   06/22/18 0608 06/23/18 0414 06/24/18 0430  Weight: 61.5 kg 60.8 kg 61.7 kg    History of present illness:  On 06/17/2018 by Dr. Felicity Pellegrini Buchananis a 84 y.o.femalewith medical history significant ofCOPD, CVA, depression, type 2 diabetes, GERD, hypertension, hypothyroidism, emphysema on chronic prednisone, scleroderma being sent to the hospital from her pulmonologist'soffice for evaluation of generalized weakness, low blood pressure, and jaw pain. Flu test negative and blood pressure 96/40 recorded at pulmonologist'soffice.Patient states she had a fever of 103.6 F this morning and has been feeling fatigued. States she was in her usual state of health yesterday and even shopped [place]. She has been coughing today and having postnasal drainage. Reports having intermittent substernal chest pressure for the past 3 weeks even at rest which is associated with shortness of breath. No associated nausea or diaphoresis. Reports having pain on both sides  of her jaw since this morning. States she has had 4 heart attacks before and 3 stents.Her cardiologist is Dr. Dorris [name].  Hospital Course:  Sepsis secondary to multifocal pneumonia -Presented with tachypnea, leukocytosis (WBC trending downward) -Chest x-ray reviewed, showing pulmonary opacities in the right upper and lower lobes -Blood cultures negative to date -Influenza PCR negative at pulmonology office per H&P -Completed course of azithromycin and ceftriaxone during hospitalization  --Discussed with patient's pulmonologist, Dr. Halford Chessman- recommended steroids.  States that patient always presents with what appears to be a right upper lobe pneumonia that does not improve with antibiotics however does improve with steroids. -Repeat CXR reviewed and showed progressive infiltrates in the right lung consistent with pneumonia.  New pulmonary vascular congestion with small effusions. -The patient on prednisone, will send her home with a prednisone taper.  She was instructed to take the taper and hold her home dose of 5 mg.  Resume 5 mg dose after prednisone taper has been completed and follow-up with pulmonology. -Follow up with Dr. Halford Chessman -weaned off of supplemental oxygen on rest -however with ambulation, patient desaturates and needs home oxygen- 2L  Chest pain/jaw pain -Patient noted to have an abnormal EKG, T wave inversions in the inferior lateral leads -Troponin unremarkable -Patient had stress test in March 2019 which is low risk -Cardiology consulted and appreciated, feels EKG abnormalities likely due to demand ischemia due to infection and hypotension -Patient has had a history of statin intolerance, continue aspirin -Has had severe hypotension, beta-blockers avoided -Echocardiogram EF 60 to 20%, grade 2 diastolic dysfunction -Cardiology consulted and appreciated, recommending outpatient follow-up with no further ischemic evaluation at this time  Acute urinary retention -Patient has  had frequent bladder scans showing urinary retention, and needing in and out catheterization.  Foley catheter has been placed. -Will clamp  foley today and monitor -Upon discussing with the patient- this has been ongoing for a while  -Upon reviewing her meds- she is on remain which may also cause some retention.  Patient should be weaned off of this, she will need to follow-up with her primary care physician. -Discussed with urology, Dr. Diona [name], recommended outpatient follow-up, avoid alpha blocker for now. May discharge without foley since this has been going on.    -patient does not want to be discharged with foley catheter -I have made her an appointment with urology for 06/25/2018 at 2pm  Hypokalemia -Resolved with replacement   COPD -Currently without wheezing, appears to be stable -Continue chronic prednisone after taper completed and bronchodilators  Diabetes mellitus, type II -hemoglobin A1c 7.2 -Continue home medications   Depression/insomnia -Continue imipramine, triazolam as needed -as above, discuss weaning imipramine  Hypothyroidism  -Continue Synthroid  GERD  -continue PPI  Deconditioning/Debility  -Appears to be frail and chronically deconditioned at baseline however given her current sepsis and pneumonia, appears to be weaker than usual -PT recommending home health as patient declined SNF  Consultants Cardiology Urology, Dr. Diona [name] via phone Pulmonology, Dr. Halford Chessman, via phone  Procedures  Echocardiogram  Discharge Exam: Vitals:   06/24/18 0430 06/24/18 0806  BP: (!) 178/67   Pulse: 79   Resp: 16   Temp: 98.6 F (37 C)   SpO2: 100% 96%     General: Well developed, chronically ill-appearing, NAD  HEENT: NCAT, mucous membranes moist.  Cardiovascular: S1 S2 auscultated, RRR  Respiratory: Diminished breath sounds however clear.   Abdomen: Soft, nontender, nondistended, + bowel sounds  Extremities: warm dry without cyanosis clubbing  or edema  Neuro: AAOx3, nonfocal  Psych: Pleasant, appropriate mood and affect  Discharge Instructions Discharge Instructions    Discharge instructions   Complete by:  As directed    Patient will be discharged to home with home health physical and occupational therapy.  Patient will need to follow up with primary care provider within one week of discharge. Follow up with cardiology and pulmonology. Follow up with urology, Dr. Matilde [name] on 06/25/2018 at 2pm. Patient should continue medications as prescribed.  Patient should follow a care modified diet.   Restart prednisone 5mg  after you have completed the prednisone taper.   Face-to-face encounter (required for Medicare/Medicaid patients)   Complete by:  As directed    I Cristal Ford certify that this patient is under my care and that I, or a nurse practitioner or physician's assistant working with me, had a face-to-face encounter that meets the physician face-to-face encounter requirements with this patient on 06/24/2018. The encounter with the patient was in whole, or in part for the following medical condition(s) which is the primary reason for home health care (List medical condition): deconditioning, shortness of breath   The encounter with the patient was in whole, or in part, for the following medical condition, which is the primary reason for home health care:  deconditioning, shortness of breath   I certify that, based on my findings, the following services are medically necessary home health services:  Physical therapy   Reason for Medically Necessary Home Health Services:  Therapy- Therapeutic Exercises to Increase Strength and Endurance   My clinical findings support the need for the above services:  Unable to leave home safely without assistance and/or assistive device   Further, I certify that my clinical findings support that this patient is homebound due to:  Unable to leave home safely without assistance   Home  Health    Complete by:  As directed    To provide the following care/treatments:   PT OT       Allergies as of 06/24/2018      Reactions   Sulfa Antibiotics Other (See Comments)   Other reaction(s): Other (See Comments) Other Reaction: anaphalaxsis Other Reaction: anaphalaxsis   Lantus [insulin Glargine]    Patient states "sulfate binder causes sinus infection/pneumonia".   Levaquin [levofloxacin In D5w] Other (See Comments)   Muscle aches   Maxidex [dexamethasone] Other (See Comments)   Insomnia, anxiety, dizziness   Metoprolol-hydrochlorothiazide Other (See Comments)    decreased bp   Norvasc [amlodipine] Other (See Comments)   BIL Ankle edema   Novolog [insulin Aspart]    Patient states "sulfate binder causes sinus infection/pneumonia". Tolerates Humalog.   Peanut Oil    Other reaction(s): Rash And throat tightening   Penciclovir    Other reaction(s): Muscle Pain   Senna [sennosides] Other (See Comments)   Pt states that it causes a stinging sensation   Sulfonamide Derivatives Hives   Titanium Other (See Comments)   Neck wouldn't heal with titanium cervical plate   Yellow Dye    Other reaction(s): Bee stings all over body when eating/taking medications with red or yellow dye    Adhesive [tape] Hives, Rash      Medication List    TAKE these medications   aspirin EC 81 MG tablet Take 1 tablet (81 mg total) by mouth daily.   esomeprazole 40 MG capsule Commonly known as:  NEXIUM Take 40 mg by mouth daily.   fluticasone 50 MCG/ACT nasal spray Commonly known as:  FLONASE Place 1 spray into both nostrils daily.   HUMALOG KWIKPEN 100 UNIT/ML KiwkPen Generic drug:  insulin lispro Inject 5-6 Units into the skin 3 (three) times daily with meals. Inject 6 units subcutaneously with breakfast and lunch, inject 5 units with supper   HYDROcodone-acetaminophen 7.5-325 MG tablet Commonly known as:  NORCO Take 1 tablet by mouth every 6 (six) hours as needed for moderate pain.     imipramine 25 MG tablet Commonly known as:  TOFRANIL Take 75 mg by mouth at bedtime.   levothyroxine 100 MCG tablet Commonly known as:  SYNTHROID, LEVOTHROID Take 100 mcg by mouth daily before breakfast.   montelukast 10 MG tablet Commonly known as:  SINGULAIR Take 10 mg by mouth at bedtime.   predniSONE 10 MG tablet Commonly known as:  DELTASONE Take with breakfast. Take 4 tabs x 1 day, then 3 tabs x 2 days, then 2 tabs x 2 days, then 1 tab x 2 days. What changed:  You were already taking a medication with the same name, and this prescription was added. Make sure you understand how and when to take each.   predniSONE 5 MG tablet Commonly known as:  DELTASONE TAKE 1 TABLET ONCE A DAY WITH BREAKFAST. Schedule office visit for additional refills Start taking on:  July 02, 2018 What changed:  These instructions start on July 02, 2018. If you are unsure what to do until then, ask your doctor or other care provider.   triazolam 0.25 MG tablet Commonly known as:  HALCION Take 0.25 mg by mouth at bedtime as needed for sleep. For sleep      Allergies  Allergen Reactions  . Sulfa Antibiotics Other (See Comments)    Other reaction(s): Other (See Comments) Other Reaction: anaphalaxsis  Other Reaction: anaphalaxsis   . Lantus [Insulin Glargine]  Patient states "sulfate binder causes sinus infection/pneumonia".  [place] [Levofloxacin In D5w] Other (See Comments)    Muscle aches  . Maxidex [Dexamethasone] Other (See Comments)    Insomnia, anxiety, dizziness  . Metoprolol-Hydrochlorothiazide Other (See Comments)     decreased bp  . Norvasc [Amlodipine] Other (See Comments)    BIL Ankle edema  . Novolog [Insulin Aspart]     Patient states "sulfate binder causes sinus infection/pneumonia". Tolerates Humalog.  . Peanut Oil     Other reaction(s): Rash And throat tightening  . Penciclovir     Other reaction(s): Muscle Pain  . Senna [Sennosides] Other (See Comments)     Pt states that it causes a stinging sensation  . Sulfonamide Derivatives Hives  . Titanium Other (See Comments)    Neck wouldn't heal with titanium cervical plate  . Yellow Dye     Other reaction(s): Bee stings all over body when eating/taking medications with red or yellow dye   . Adhesive [Tape] Hives and Rash   Follow-up Information    [facility] Follow [facility].   Specialty:  Home Health Services Why:  Physical Therapy, Occupational Therapy Contact information: [address] [place] [address] [phone]        Fay Records, MD Follow up.   Specialty:  Cardiology Why:  Office will call you to arrange follow-up with Dr. Harrington Challenger or one of her associates Contact information: [place] [address] [phone]        Bjorn Loser, MD Follow up.   Specialty:  Urology Contact information: [place] [address] [phone]        Renee Seashore, MD. Schedule an appointment as soon as possible for a visit in 1 week(s).   Specialty:  Internal Medicine Why:  Hospital followup Contact information: [address] [place] [address] [phone]        Chesley Mires, MD. Schedule an appointment as soon as possible for a visit in 1 week(s).   Specialty:  Pulmonary Disease Why:  Hospital follow up Contact information: [address] [place] [address] [phone]            The results of significant diagnostics from this hospitalization (including imaging, microbiology, ancillary and laboratory) are listed below for reference.    Significant Diagnostic Studies: Dg Chest 2 View  Result Date: 06/22/2018 CLINICAL DATA:  Multifocal pneumonia. Sepsis. Shortness of breath. EXAM: CHEST - 2 VIEW COMPARISON:  06/17/2018 and 07/13/2017 FINDINGS: There has been slight progression of the infiltrates in the right lung. There is new pulmonary vascular  congestion with slight distention of the azygos vein and new accentuation of the interstitial markings bilaterally as well as development of minimal bilateral pleural effusions. There is new pleural fluid along 1 of the major fissures on the lateral view. Bones are normal. Aortic atherosclerosis. IMPRESSION: 1. Progressive infiltrates in the right lung consistent with pneumonia. 2. New pulmonary vascular congestion, interstitial edema, and small effusions, suggesting congestive heart failure. Electronically Signed   By: Lorriane Shire M.D.   On: 06/22/2018 08:44   Dg Chest 2 View  Result Date: 06/17/2018 CLINICAL DATA:  Dyspnea with productive cough and fever. EXAM: CHEST - 2 VIEW COMPARISON:  07/13/2017 FINDINGS: Heart size and mediastinal contours are stable with aortic atherosclerosis. Surgical clips project over the base of the neck on the left prior left thyroid lobectomy. Asymmetric airspace opacities are identified about the  right hilum, right upper and lower lobe predominant compatible with multilobar pneumonia, less likely unilateral CHF. Left lung remains relatively clear. No acute osseous abnormality. IMPRESSION: Pulmonary opacities about the right hilum, right upper and lower lobes compatible with multilobar pneumonia, less likely unilateral CHF given history of fever and cough. Electronically Signed   By: Ashley Royalty M.D.   On: 06/17/2018 18:25    Microbiology: Recent Results (from the past 240 hour(s))  Culture, blood (routine x 2)     Status: None   Collection Time: 06/17/18  6:40 PM  Result Value Ref Range Status   Specimen Description BLOOD RIGHT ARM  Final   Special Requests   Final    BOTTLES DRAWN AEROBIC ONLY Blood Culture results may not be optimal due to an inadequate volume of blood received in culture bottles   Culture   Final    NO GROWTH 5 DAYS Performed at [facility], [place] [address] [place], [place] [address]    Report Status 06/22/2018 FINAL  Final  Culture,  blood (routine x 2)     Status: None   Collection Time: 06/17/18  6:40 PM  Result Value Ref Range Status   Specimen Description BLOOD LEFT ARM  Final   Special Requests   Final    BOTTLES DRAWN AEROBIC ONLY Blood Culture adequate volume   Culture   Final    NO GROWTH 5 DAYS Performed at [facility], [place] [address] [place], [place] [address]    Report Status 06/22/2018 FINAL  Final  MRSA PCR Screening     Status: None   Collection Time: 06/18/18  5:07 AM  Result Value Ref Range Status   MRSA by PCR NEGATIVE NEGATIVE Final    Comment:        The GeneXpert MRSA Assay (FDA approved for NASAL specimens only), is one component of a comprehensive MRSA colonization surveillance program. It is not intended to diagnose MRSA infection nor to guide or monitor treatment for MRSA infections. Performed at [facility], [place] [address] [place], [place] [address]      Labs: Basic Metabolic Panel: Recent Labs  Lab 06/17/18 1840 06/19/18 0407 06/20/18 0326 06/23/18 0713  NA 133* 135 135 133*  K 3.3* 3.2* 3.9 4.3  CL 95* 104 103 98  CO2 25 19* 22 23  GLUCOSE 184* 114* 114* 167*  BUN 25* 10 8 16   CREATININE 1.06* 0.67 0.71 0.78  CALCIUM 8.7* 7.9* 8.3* 8.5*   Liver Function Tests: Recent Labs  Lab 06/17/18 1840  AST 20  ALT 15  ALKPHOS 43  BILITOT 1.0  PROT 6.3*  ALBUMIN 3.3*   No results for input(s): LIPASE, AMYLASE in the last 168 hours. No results for input(s): AMMONIA in the last 168 hours. CBC: Recent Labs  Lab 06/17/18 1840 06/18/18 0305 06/19/18 0407  WBC 21.5* 14.1* 11.9*  NEUTROABS 18.2*  --   --   HGB 12.3 10.1* 10.2*  HCT 38.8 31.8* 32.1*  MCV 86.0 85.0 84.0  PLT 191 161 150   Cardiac Enzymes: Recent Labs  Lab 06/17/18 2116 06/18/18 0305 06/18/18 0931  TROPONINI <0.03 <0.03 <0.03   BNP: BNP (last 3 results) Recent Labs    07/09/17 1411 07/13/17 1047  BNP 39.5 214.7*    ProBNP (last 3 results) No results for input(s): PROBNP  in the last 8760 hours.  CBG: Recent Labs  Lab 06/23/18 0722 06/23/18 1124 06/23/18 1633 06/23/18 2101 06/24/18 6314  GLUCAP 156* [place]       Signed:  Sean Malinowski  [facility] 06/24/2018, 10:37 AM

## 2018-06-21 NOTE — Progress Notes (Signed)
PROGRESS NOTE    PAXTYN WISDOM  ZHY:[id number] DOB: Sep 09, 1930 DOA: 06/17/2018 PCP: Merrilee Seashore, MD   Brief Narrative:  HPI 06/17/2018 by Dr. Shela Leff RAEVEN PINT is a 84 y.o. female with medical history significant of COPD, CVA, depression, type 2 diabetes, GERD, hypertension, hypothyroidism, emphysema on chronic prednisone, scleroderma being sent to the hospital from her pulmonologist's office for evaluation of generalized weakness, low blood pressure, and jaw pain. Flu test negative and blood pressure 96/40 recorded at pulmonologist's office.  Patient states she had a fever of 103.6 F this morning and has been feeling fatigued.  States she was in her usual state of health yesterday and even shopped at [employer].  She has been coughing today and having postnasal drainage.  Reports having intermittent substernal chest pressure for the past 3 weeks even at rest which is associated with shortness of breath.  No associated nausea or diaphoresis.  Reports having pain on both sides of her jaw since this morning.  States she has had 4 heart attacks before and 3 stents.  Her cardiologist is Dr. Dorris Carnes.  Interim history Admitted with sepsis secondary to multifocal pneumonia.  Patient was found to have abnormal, suspected to be demand ischemia.  Cardiology consulted and is now signed off.  Assessment & Plan   Sepsis secondary to multifocal pneumonia -Presented with tachypnea, leukocytosis (WBC trending downward) -Chest x-ray reviewed, showing pulmonary opacities in the right upper and lower lobes -Blood cultures negative to date -Influenza PCR negative at pulmonology office per H&P -Continue azithromycin and ceftriaxone -Still requiring supplemental oxygen  Chest pain/jaw pain -Patient noted to have an abnormal EKG, T wave inversions in the inferior lateral leads -Troponin unremarkable -Patient had stress test in March 2019 which is low risk -Cardiology consulted and  appreciated, feels EKG abnormalities likely due to demand ischemia due to infection and hypotension -Patient has had a history of statin intolerance, continue aspirin -Has had severe hypotension, beta-blockers avoided -Echocardiogram EF 60 to 29%, grade 2 diastolic dysfunction -Cardiology consulted and appreciated, recommending outpatient follow-up with no further ischemic evaluation at this time  Acute urinary retention -Patient has had frequent bladder scans showing urinary retention, and needing in and out catheterization.  Foley catheter has been placed. -patient failed voiding trial on 12/26 -Attempt voiding trial again today -Reviewed patient's home medications, she is on imipramine which may also cause some urinary retention.  She should follow-up with her PCP regarding weaning of this medication -Discussed with urology, recommended outpatient follow-up  Hypokalemia -Continue to replace and monitor   COPD -Currently without wheezing, appears to be stable -Continue chronic prednisone and bronchodilators  Diabetes mellitus, type II -Continue insulin sliding scale with CBG monitoring -hemoglobin A1c 7.2  Depression/insomnia -Continue imipramine, triazolam as needed  Hypothyroidism  -Continue Synthroid  GERD  -continue PPI  Deconditioning/Debility  -Appears to be frail and chronically deconditioned at baseline however given her current sepsis and pneumonia, appears to be weaker than usual -PT recommending home health as patient declined SNF  DVT Prophylaxis  lovenox  Code Status: Full  Family Communication: None at bedside  Disposition Plan: Admitted. Suspect home with home health when patient symptoms have improved  Consultants Cardiology Urology   Procedures  Echocardiogram  Antibiotics   Anti-infectives (From admission, onward)   Start     Dose/Rate Route Frequency Ordered Stop   06/21/18 1200  azithromycin (ZITHROMAX) tablet 500 mg     500 mg Oral  Once  06/21/18 1102 06/21/18 1250  06/18/18 2000  azithromycin (ZITHROMAX) 500 mg in sodium chloride 0.9 % 250 mL IVPB  Status:  Discontinued     500 mg 250 mL/hr over 60 Minutes Intravenous Every 24 hours 06/17/18 2214 06/21/18 1102   06/18/18 1800  cefTRIAXone (ROCEPHIN) 1 g in sodium chloride 0.9 % 100 mL IVPB     1 g 200 mL/hr over 30 Minutes Intravenous Every 24 hours 06/17/18 2214     06/17/18 1845  cefTRIAXone (ROCEPHIN) 1 g in sodium chloride 0.9 % 100 mL IVPB     1 g 200 mL/hr over 30 Minutes Intravenous  Once 06/17/18 1844 06/17/18 2016   06/17/18 1845  azithromycin (ZITHROMAX) 500 mg in sodium chloride 0.9 % 250 mL IVPB     500 mg 250 mL/hr over 60 Minutes Intravenous  Once 06/17/18 1844 06/17/18 2142      Subjective:   Allean Found seen and examined today.  States she feels more short of breath.  He continues to feel bad and feel weak.  Continues to cough occasionally.  Denies current chest pain, abdominal pain, nausea or vomiting, diarrhea or constipation, dizziness or headache.     Objective:   Vitals:   06/20/18 2017 06/21/18 0118 06/21/18 0455 06/21/18 0817  BP: 133/62  (!) 142/43   Pulse: 82 79 73 81  Resp:    14  Temp: 98.9 F (37.2 C)  98.4 F (36.9 C)   TempSrc: Oral  Oral   SpO2: 92% 92% 92% 93%  Weight:   62.2 kg   Height:   5\' 2"  (1.575 m)     Intake/Output Summary (Last 24 hours) at 06/21/2018 1353 Last data filed at 06/21/2018 1337 Gross per 24 hour  Intake 480 ml  Output 2150 ml  Net -1670 ml   Filed Weights   06/17/18 2223 06/18/18 0354 06/21/18 0455  Weight: 63.2 kg 65.1 kg 62.2 kg   Exam  General: Well developed, chronically ill-appearing, elderly  HEENT: NCAT, mucous membranes moist.   Neck: Supple  Cardiovascular: S1 S2 auscultated, RRR, no murmur  Respiratory: Diminished breath sounds, occasional cough, increased work of breathing  Abdomen: Soft, nontender, nondistended, + bowel sounds  Extremities: warm dry without cyanosis  clubbing or edema  Neuro: AAOx3, focal  Psych: Appropriate mood and affect  Data Reviewed: I have personally reviewed following labs and imaging studies  CBC: Recent Labs  Lab 06/17/18 1840 06/18/18 0305 06/19/18 0407  WBC 21.5* 14.1* 11.9*  NEUTROABS 18.2*  --   --   HGB 12.3 10.1* 10.2*  HCT 38.8 31.8* 32.1*  MCV 86.0 85.0 84.0  PLT 191 161 497   Basic Metabolic Panel: Recent Labs  Lab 06/17/18 1840 06/19/18 0407 06/20/18 0326  NA 133* 135 135  K 3.3* 3.2* 3.9  CL 95* 104 103  CO2 25 19* 22  GLUCOSE 184* 114* 114*  BUN 25* 10 8  CREATININE 1.06* 0.67 0.71  CALCIUM 8.7* 7.9* 8.3*   GFR: Estimated Creatinine Clearance: 44.6 mL/min (by C-G formula based on SCr of 0.71 mg/dL). Liver Function Tests: Recent Labs  Lab 06/17/18 1840  AST 20  ALT 15  ALKPHOS 43  BILITOT 1.0  PROT 6.3*  ALBUMIN 3.3*   No results for input(s): LIPASE, AMYLASE in the last 168 hours. No results for input(s): AMMONIA in the last 168 hours. Coagulation Profile: No results for input(s): INR, PROTIME in the last 168 hours. Cardiac Enzymes: Recent Labs  Lab 06/17/18 2116 06/18/18 0305 06/18/18 0931  TROPONINI <  0.03 <0.03 <0.03   BNP (last 3 results) No results for input(s): PROBNP in the last 8760 hours. HbA1C: No results for input(s): HGBA1C in the last 72 hours. CBG: Recent Labs  Lab 06/20/18 1124 06/20/18 1620 06/20/18 2142 06/21/18 0745 06/21/18 1232  GLUCAP 126* 240* 142* 92 218*   Lipid Profile: No results for input(s): CHOL, HDL, LDLCALC, TRIG, CHOLHDL, LDLDIRECT in the last 72 hours. Thyroid Function Tests: No results for input(s): TSH, T4TOTAL, FREET4, T3FREE, THYROIDAB in the last 72 hours. Anemia Panel: No results for input(s): VITAMINB12, FOLATE, FERRITIN, TIBC, IRON, RETICCTPCT in the last 72 hours. Urine analysis:    Component Value Date/Time   COLORURINE YELLOW 06/18/2018 0225   APPEARANCEUR CLEAR 06/18/2018 0225   LABSPEC 1.010 06/18/2018 0225    PHURINE 6.0 06/18/2018 0225   GLUCOSEU NEGATIVE 06/18/2018 0225   GLUCOSEU NEGATIVE 03/24/2010 1137   HGBUR NEGATIVE 06/18/2018 0225   BILIRUBINUR NEGATIVE 06/18/2018 0225   KETONESUR NEGATIVE 06/18/2018 0225   PROTEINUR NEGATIVE 06/18/2018 0225   UROBILINOGEN 0.2 05/01/2015 2019   NITRITE NEGATIVE 06/18/2018 0225   LEUKOCYTESUR NEGATIVE 06/18/2018 0225   Sepsis Labs: @LABRCNTIP (procalcitonin:4,lacticidven:4)  ) Recent Results (from the past 240 hour(s))  Culture, blood (routine x 2)     Status: None (Preliminary result)   Collection Time: 06/17/18  6:40 PM  Result Value Ref Range Status   Specimen Description BLOOD RIGHT ARM  Final   Special Requests   Final    BOTTLES DRAWN AEROBIC ONLY Blood Culture results may not be optimal due to an inadequate volume of blood received in culture bottles   Culture   Final    NO GROWTH 4 DAYS Performed at [facility], [place] [address] [place], [place] [address]    Report Status PENDING  Incomplete  Culture, blood (routine x 2)     Status: None (Preliminary result)   Collection Time: 06/17/18  6:40 PM  Result Value Ref Range Status   Specimen Description BLOOD LEFT ARM  Final   Special Requests   Final    BOTTLES DRAWN AEROBIC ONLY Blood Culture adequate volume   Culture   Final    NO GROWTH 4 DAYS Performed at [facility], [address], [place], [place] [address]    Report Status PENDING  Incomplete  MRSA PCR Screening     Status: None   Collection Time: 06/18/18  5:07 AM  Result Value Ref Range Status   MRSA by PCR NEGATIVE NEGATIVE Final    Comment:        The GeneXpert MRSA Assay (FDA approved for NASAL specimens only), is one component of a comprehensive MRSA colonization surveillance program. It is not intended to diagnose MRSA infection nor to guide or monitor treatment for MRSA infections. Performed at [facility], [place] [address], [place], [place] [address]       Radiology Studies: No results  found.   Scheduled Meds: . arformoterol  15 mcg Nebulization BID  . aspirin EC  81 mg Oral Daily  . enoxaparin (LOVENOX) injection  40 mg Subcutaneous Q24H  . fluticasone  1 spray Each Nare Daily  . guaiFENesin  600 mg Oral BID  . imipramine  75 mg Oral QHS  . insulin aspart  0-9 Units Subcutaneous TID WC  . levothyroxine  100 mcg Oral QAC breakfast  . montelukast  10 mg Oral QHS  . pantoprazole  40 mg Oral Daily  . predniSONE  5 mg Oral  Q breakfast   Continuous Infusions: . cefTRIAXone (ROCEPHIN)  IV 1 g (06/21/18 1250)     LOS: 3 days   Time Spent in minutes   30 minutes  Chavez Rosol D.O. on 06/21/2018 at 1:53 PM  Between 7am to 7pm - Please see pager noted on [email or web address]  After 7pm go to [email or web address]  And look for the night coverage person covering for me after hours  [facility] Office  [phone]

## 2018-06-21 NOTE — Care Management Important Message (Signed)
Important Message  Patient Details  Name: [name] MRN: [id number] Date of Birth: 08/07/1930   Medicare Important Message Given:  Yes    Sissi Padia P Lyllian Gause 06/21/2018, 4:52 PM

## 2018-06-21 NOTE — Consult Note (Signed)
            [facility] CM Primary Care Navigator  06/21/2018  [name] [birth year] [id number]   Attempt to see patient at the bedside to identify possible discharge needs but noted on Inpatient CM note that patient is currently active with [facility] (Palliative Services).  Per MD note, patient will be discharged to home with home health services and will need to follow-up with primary care provider within 1 week of discharge. He was sent to the hospital from her pulmonologist'soffice for evaluation of generalized weakness, low blood pressure, and jaw pain and was found to have sepsis secondary to multifocal pneumonia.  Primary care provider's office is listed as providing transition of care (TOC).  No further [facility] care management needs at this point.   For additional questions please contact:  Edwena Felty A. Ali Mclaurin, BSN, RN-BC [facility] PRIMARY CARE Navigator Cell: [phone]

## 2018-06-21 NOTE — Progress Notes (Signed)
SATURATION QUALIFICATIONS: (This note is used to comply with regulatory documentation for home oxygen)  Patient Saturations on Room Air at Rest =  91 %  Patient Saturations on Room Air while Ambulating =  81 %  Patient Saturations on 2 Liters of oxygen while Ambulating =  92 %  Please briefly explain why patient needs home oxygen:

## 2018-06-21 NOTE — Progress Notes (Signed)
Physical Therapy Treatment Patient Details Name: [name] MRN: [id number] DOB: 1931-04-05 Today's Date: 06/21/2018    History of Present Illness Pt is an 84 y.o. female admitted with L shoulder and jaw pain, also with worsening of chronic SOB and weakness. Worked up for sepsis secondary to multifocal PNA; troponins elevated, likely demand ischemia. PMH includes legally blind, COPD (on chronic prednisone), DM2, depression.   PT Comments    Pt with increased fatigue this session. Stability improved ambulating with RW; pt remains reluctant to use at home although she agrees her balance is better with it. Pt with increased DOE this session; SpO2 down to 83% on RA, >87% on 2L O2 [place] (unsure if reliable pleth; difficult to determine accurate reading from finger pulse ox). Pt reports concern returning home in current condition, and unsure about discharging with indwelling catheter; continues to decline post-acute rehab services. Discussed energy conservation strategies and fall risk reduction.   Follow Up Recommendations  Home health PT;Supervision - Intermittent(declining SNF)     Equipment Recommendations  None recommended by PT    Recommendations for Other Services       Precautions / Restrictions Precautions Precautions: Fall Precaution Comments: Watch SpO2 Restrictions Weight Bearing Restrictions: No    Mobility  Bed Mobility Overal bed mobility: Modified Independent             General bed mobility comments: Increased time and effort  Transfers Overall transfer level: Needs assistance Equipment used: None;Rolling walker (2 wheeled) Transfers: Sit to/from Stand Sit to Stand: Supervision         General transfer comment: Suggested use of RW secondary to c/o worsening fatigue and needing to practice management of foley cath (may be d/c home with indwelling foley). Supervision for safety  Ambulation/Gait Ambulation/Gait assistance: Supervision Gait Distance  (Feet): 250 Feet Assistive device: Rolling walker (2 wheeled)   Gait velocity: Decreased Gait velocity interpretation: 1.31 - 2.62 ft/sec, indicative of limited community ambulator General Gait Details: Stability improved with RW this session; speed decreased, pt reports increased fatigue. DOE 3/4 requiring intermittent standing rest breaks. SpO2 down to 83% on RA (unsure if reliable pleth), >87% on 2L O2    Stairs             Wheelchair Mobility    Modified Rankin (Stroke Patients Only)       Balance Overall balance assessment: Needs assistance   Sitting balance-Leahy Scale: Good       Standing balance-Leahy Scale: Fair                              Cognition Arousal/Alertness: Awake/alert Behavior During Therapy: WFL for tasks assessed/performed Overall Cognitive Status: Within Functional Limits for tasks assessed                                        Exercises      General Comments        Pertinent Vitals/Pain Pain Assessment: Faces Faces Pain Scale: Hurts a little bit Pain Location: Headache Pain Descriptors / Indicators: Headache Pain Intervention(s): Monitored during session;Limited activity within patient's tolerance    Home Living                      Prior Function            PT Goals (current goals  can now be found in the care plan section) Acute Rehab PT Goals Patient Stated Goal: Get out of this bed more often; return home PT Goal Formulation: With patient Time For Goal Achievement: 07/02/18 Potential to Achieve Goals: Good Progress towards PT goals: Progressing toward goals    Frequency    Min 3X/week      PT Plan Current plan remains appropriate    Co-evaluation              AM-PAC PT "6 Clicks" Mobility   Outcome Measure  Help needed turning from your back to your side while in a flat bed without using bedrails?: None Help needed moving from lying on your back to sitting on  the side of a flat bed without using bedrails?: None Help needed moving to and from a bed to a chair (including a wheelchair)?: A Little Help needed standing up from a chair using your arms (e.g., wheelchair or bedside chair)?: A Little Help needed to walk in hospital room?: A Little Help needed climbing 3-5 steps with a railing? : A Little 6 Click Score: 20    End of Session Equipment Utilized During Treatment: Gait belt;Oxygen Activity Tolerance: Patient limited by fatigue Patient left: in bed;with call bell/phone within reach;with bed alarm set Nurse Communication: Mobility status PT Visit Diagnosis: Other abnormalities of gait and mobility (R26.89);Muscle weakness (generalized) (M62.81)     Time: 6599-3570 PT Time Calculation (min) (ACUTE ONLY): 29 min  Charges:  $Gait Training: 8-22 mins $Self Care/Home Management: [place], PT, DPT Acute Rehabilitation Services  Pager [phone] Office [phone]  Derry Lory 06/21/2018, 12:04 PM

## 2018-06-22 ENCOUNTER — Inpatient Hospital Stay (HOSPITAL_COMMUNITY): Payer: Medicare Other

## 2018-06-22 LAB — CULTURE, BLOOD (ROUTINE X 2)
Culture: NO GROWTH
Culture: NO GROWTH
Special Requests: ADEQUATE

## 2018-06-22 LAB — GLUCOSE, CAPILLARY
Glucose-Capillary: 103 mg/dL — ABNORMAL HIGH (ref 70–99)
Glucose-Capillary: 130 mg/dL — ABNORMAL HIGH (ref 70–99)
Glucose-Capillary: 250 mg/dL — ABNORMAL HIGH (ref 70–99)
Glucose-Capillary: 290 mg/dL — ABNORMAL HIGH (ref 70–99)

## 2018-06-22 MED ORDER — METHYLPREDNISOLONE SODIUM SUCC 40 MG IJ SOLR
40.0000 mg | Freq: Every day | INTRAMUSCULAR | Status: DC
  Administered 2018-06-22 – 2018-06-24 (×3): 40 mg via INTRAVENOUS
  Filled 2018-06-22 (×3): qty 1

## 2018-06-22 MED ORDER — FUROSEMIDE 10 MG/ML IJ SOLN
20.0000 mg | Freq: Once | INTRAMUSCULAR | Status: AC
  Administered 2018-06-22: 20 mg via INTRAVENOUS
  Filled 2018-06-22: qty 2

## 2018-06-22 MED ORDER — INSULIN LISPRO 100 UNIT/ML ~~LOC~~ SOLN
0.0000 [IU] | Freq: Three times a day (TID) | SUBCUTANEOUS | Status: DC
  Administered 2018-06-22: 3 [IU] via SUBCUTANEOUS
  Administered 2018-06-23 (×2): 2 [IU] via SUBCUTANEOUS
  Administered 2018-06-23: 5 [IU] via SUBCUTANEOUS
  Administered 2018-06-24: 2 [IU] via SUBCUTANEOUS
  Administered 2018-06-24: 1 [IU] via SUBCUTANEOUS
  Filled 2018-06-22 (×2): qty 10

## 2018-06-22 NOTE — Progress Notes (Signed)
PROGRESS NOTE    [name]  VQM:[id number] DOB: 07-04-1930 DOA: 06/17/2018 PCP: Merrilee Seashore, MD   Brief Narrative:  HPI 06/17/2018 by Dr. Shela Leff [name] is a 84 y.o. female with medical history significant of COPD, CVA, depression, type 2 diabetes, GERD, hypertension, hypothyroidism, emphysema on chronic prednisone, scleroderma being sent to the hospital from her pulmonologist's office for evaluation of generalized weakness, low blood pressure, and jaw pain. Flu test negative and blood pressure 96/40 recorded at pulmonologist's office.  Patient states she had a fever of 103.6 F this morning and has been feeling fatigued.  States she was in her usual state of health yesterday and even shopped at [employer].  She has been coughing today and having postnasal drainage.  Reports having intermittent substernal chest pressure for the past 3 weeks even at rest which is associated with shortness of breath.  No associated nausea or diaphoresis.  Reports having pain on both sides of her jaw since this morning.  States she has had 4 heart attacks before and 3 stents.  Her cardiologist is Dr. Dorris Carnes.  Interim history Admitted with sepsis secondary to multifocal pneumonia.  Patient was found to have abnormal, suspected to be demand ischemia.  Cardiology consulted and is now signed off.   Assessment & Plan   Sepsis secondary to multifocal pneumonia -Presented with tachypnea, leukocytosis (WBC trending downward) -Chest x-ray reviewed, showing pulmonary opacities in the right upper and lower lobes -Blood cultures negative to date -Influenza PCR negative at pulmonology office per H&P -Continue azithromycin and ceftriaxone -Still requiring supplemental oxygen -Discussed with patient's pulmonologist, Dr. Halford Chessman- recommended steroids.  States that patient always presents with what appears to be a right upper lobe pneumonia that does not improve with antibiotics however does  improve with steroids. -Repeat CXR reviewed today, showing progressive infiltrates in the right lung consistent with pneumonia.  New pulmonary vascular congestion with small effusions.  Chest pain/jaw pain -Patient noted to have an abnormal EKG, T wave inversions in the inferior lateral leads -Troponin unremarkable -Patient had stress test in March 2019 which is low risk -Cardiology consulted and appreciated, feels EKG abnormalities likely due to demand ischemia due to infection and hypotension -Patient has had a history of statin intolerance, continue aspirin -Has had severe hypotension, beta-blockers avoided -Echocardiogram EF 60 to 93%, grade 2 diastolic dysfunction -Cardiology consulted and appreciated, recommending outpatient follow-up with no further ischemic evaluation at this time  Acute urinary retention -Patient has had frequent bladder scans showing urinary retention, and needing in and out catheterization.  Foley catheter has been placed. -patient failed voiding trial on 12/26 -Attempt voiding trial again today -Reviewed patient's home medications, she is on imipramine which may also cause some urinary retention.  She should follow-up with her PCP regarding weaning of this medication -Discussed with urology, recommended outpatient follow-up  Hypokalemia -Continue to replace and monitor   COPD -Currently without wheezing, appears to be stable -Continue bronchodilators  Diabetes mellitus, type II -Continue insulin sliding scale with CBG monitoring -hemoglobin A1c 7.2  Depression/insomnia -Continue imipramine, triazolam as needed  Hypothyroidism  -Continue Synthroid  GERD  -continue PPI  Deconditioning/Debility  -Appears to be frail and chronically deconditioned at baseline however given her current sepsis and pneumonia, appears to be weaker than usual -PT recommending home health as patient declined SNF  History of scleroderma -Patient on prednisone chronically  5 mg, will hold this for now and place on methylprednisone 40 mg  DVT Prophylaxis  lovenox  Code Status: Full  Family Communication: None at bedside  Disposition Plan: Admitted. Suspect home with home health when patient symptoms have improved  Consultants Cardiology Urology, Dr. Diona Fanti via phone Pulmonology, Dr. Halford Chessman, via phone  Procedures  Echocardiogram  Antibiotics   Anti-infectives (From admission, onward)   Start     Dose/Rate Route Frequency Ordered Stop   06/21/18 1200  azithromycin (ZITHROMAX) tablet 500 mg     500 mg Oral  Once 06/21/18 1102 06/21/18 1250   06/18/18 2000  azithromycin (ZITHROMAX) 500 mg in sodium chloride 0.9 % 250 mL IVPB  Status:  Discontinued     500 mg 250 mL/hr over 60 Minutes Intravenous Every 24 hours 06/17/18 2214 06/21/18 1102   06/18/18 1800  cefTRIAXone (ROCEPHIN) 1 g in sodium chloride 0.9 % 100 mL IVPB     1 g 200 mL/hr over 30 Minutes Intravenous Every 24 hours 06/17/18 2214     06/17/18 1845  cefTRIAXone (ROCEPHIN) 1 g in sodium chloride 0.9 % 100 mL IVPB     1 g 200 mL/hr over 30 Minutes Intravenous  Once 06/17/18 1844 06/17/18 2016   06/17/18 1845  azithromycin (ZITHROMAX) 500 mg in sodium chloride 0.9 % 250 mL IVPB     500 mg 250 mL/hr over 60 Minutes Intravenous  Once 06/17/18 1844 06/17/18 2142      Subjective:   Allean Found seen and examined today.  Patient continues to states she feels bad and does not feel very well.  She continues to have mild cough and shortness of breath.  Denies current chest pain, abdominal pain, nausea or vomiting, diarrhea or constipation, dizziness or headache.  Objective:   Vitals:   06/21/18 0817 06/21/18 1926 06/21/18 1944 06/22/18 0608  BP:   (!) 151/75 (!) 142/52  Pulse: 81  85 83  Resp: 14  14   Temp:   99.2 F (37.3 C) 99.6 F (37.6 C)  TempSrc:   Oral Oral  SpO2: 93% 97% 94% 97%  Weight:    61.5 kg  Height:        Intake/Output Summary (Last 24 hours) at 06/22/2018  1044 Last data filed at 06/22/2018 0500 Gross per 24 hour  Intake 600 ml  Output 2550 ml  Net -1950 ml   Filed Weights   06/18/18 0354 06/21/18 0455 06/22/18 7616  Weight: 65.1 kg 62.2 kg 61.5 kg   Exam  General: Well developed, chronically ill-appearing, NAD  HEENT: NCAT, mucous membranes moist.   Neck: Supple  Cardiovascular: S1 S2 auscultated, RRR, no murmur  Respiratory: Diminished breath sounds, occasional cough  Abdomen: Soft, nontender, nondistended, + bowel sounds  Extremities: warm dry without cyanosis clubbing or edema  Neuro: AAOx3, nonfocal  Psych: Appropriate mood and affect  Data Reviewed: I have personally reviewed following labs and imaging studies  CBC: Recent Labs  Lab 06/17/18 1840 06/18/18 0305 06/19/18 0407  WBC 21.5* 14.1* 11.9*  NEUTROABS 18.2*  --   --   HGB 12.3 10.1* 10.2*  HCT 38.8 31.8* 32.1*  MCV 86.0 85.0 84.0  PLT 191 161 073   Basic Metabolic Panel: Recent Labs  Lab 06/17/18 1840 06/19/18 0407 06/20/18 0326  NA 133* 135 135  K 3.3* 3.2* 3.9  CL 95* 104 103  CO2 25 19* 22  GLUCOSE 184* 114* 114*  BUN 25* 10 8  CREATININE 1.06* 0.67 0.71  CALCIUM 8.7* 7.9* 8.3*   GFR: Estimated Creatinine Clearance: 44.4 mL/min (by C-G formula based on SCr of  0.71 mg/dL). Liver Function Tests: Recent Labs  Lab 06/17/18 1840  AST 20  ALT 15  ALKPHOS 43  BILITOT 1.0  PROT 6.3*  ALBUMIN 3.3*   No results for input(s): LIPASE, AMYLASE in the last 168 hours. No results for input(s): AMMONIA in the last 168 hours. Coagulation Profile: No results for input(s): INR, PROTIME in the last 168 hours. Cardiac Enzymes: Recent Labs  Lab 06/17/18 2116 06/18/18 0305 06/18/18 0931  TROPONINI <0.03 <0.03 <0.03   BNP (last 3 results) No results for input(s): PROBNP in the last 8760 hours. HbA1C: No results for input(s): HGBA1C in the last 72 hours. CBG: Recent Labs  Lab 06/21/18 0745 06/21/18 1232 06/21/18 1634 06/21/18 2141  06/22/18 0752  GLUCAP 92 218* 195* 116* 103*   Lipid Profile: No results for input(s): CHOL, HDL, LDLCALC, TRIG, CHOLHDL, LDLDIRECT in the last 72 hours. Thyroid Function Tests: No results for input(s): TSH, T4TOTAL, FREET4, T3FREE, THYROIDAB in the last 72 hours. Anemia Panel: No results for input(s): VITAMINB12, FOLATE, FERRITIN, TIBC, IRON, RETICCTPCT in the last 72 hours. Urine analysis:    Component Value Date/Time   COLORURINE YELLOW 06/18/2018 0225   APPEARANCEUR CLEAR 06/18/2018 0225   LABSPEC 1.010 06/18/2018 0225   PHURINE 6.0 06/18/2018 0225   GLUCOSEU NEGATIVE 06/18/2018 0225   GLUCOSEU NEGATIVE 03/24/2010 1137   HGBUR NEGATIVE 06/18/2018 0225   BILIRUBINUR NEGATIVE 06/18/2018 0225   KETONESUR NEGATIVE 06/18/2018 0225   PROTEINUR NEGATIVE 06/18/2018 0225   UROBILINOGEN 0.2 05/01/2015 2019   NITRITE NEGATIVE 06/18/2018 0225   LEUKOCYTESUR NEGATIVE 06/18/2018 0225   Sepsis Labs: @LABRCNTIP (procalcitonin:4,lacticidven:4)  ) Recent Results (from the past 240 hour(s))  Culture, blood (routine x 2)     Status: None   Collection Time: 06/17/18  6:40 PM  Result Value Ref Range Status   Specimen Description BLOOD RIGHT ARM  Final   Special Requests   Final    BOTTLES DRAWN AEROBIC ONLY Blood Culture results may not be optimal due to an inadequate volume of blood received in culture bottles   Culture   Final    NO GROWTH 5 DAYS Performed at [facility], [place] [address], [place], [place] [address]    Report Status 06/22/2018 FINAL  Final  Culture, blood (routine x 2)     Status: None   Collection Time: 06/17/18  6:40 PM  Result Value Ref Range Status   Specimen Description BLOOD LEFT ARM  Final   Special Requests   Final    BOTTLES DRAWN AEROBIC ONLY Blood Culture adequate volume   Culture   Final    NO GROWTH 5 DAYS Performed at [facility], [place] [address], [place], [place] [address]    Report Status 06/22/2018 FINAL  Final  MRSA PCR Screening      Status: None   Collection Time: 06/18/18  5:07 AM  Result Value Ref Range Status   MRSA by PCR NEGATIVE NEGATIVE Final    Comment:        The GeneXpert MRSA Assay (FDA approved for NASAL specimens only), is one component of a comprehensive MRSA colonization surveillance program. It is not intended to diagnose MRSA infection nor to guide or monitor treatment for MRSA infections. Performed at [facility], [place] [address] [place], [place] [address]       Radiology Studies: Dg Chest 2 View  Result Date: 06/22/2018 CLINICAL DATA:  Multifocal pneumonia. Sepsis. Shortness of breath. EXAM: CHEST - 2 VIEW COMPARISON:  06/17/2018 and 07/13/2017 FINDINGS: There has been slight progression of the infiltrates in the right lung. There is new pulmonary vascular congestion with slight distention of the azygos vein and new accentuation of the interstitial markings bilaterally as well as development of minimal bilateral pleural effusions. There is new pleural fluid along 1 of the major fissures on the lateral view. Bones are normal. Aortic atherosclerosis. IMPRESSION: 1. Progressive infiltrates in the right lung consistent with pneumonia. 2. New pulmonary vascular congestion, interstitial edema, and small effusions, suggesting congestive heart failure. Electronically Signed   By: Lorriane Shire M.D.   On: 06/22/2018 08:44     Scheduled Meds: . arformoterol  15 mcg Nebulization BID  . aspirin EC  81 mg Oral Daily  . enoxaparin (LOVENOX) injection  40 mg Subcutaneous Q24H  . fluticasone  1 spray Each Nare Daily  . furosemide  20 mg Intravenous Once  . guaiFENesin  600 mg Oral BID  . imipramine  75 mg Oral QHS  . insulin aspart  0-9 Units Subcutaneous TID WC  . levothyroxine  100 mcg Oral QAC breakfast  . methylPREDNISolone (SOLU-MEDROL) injection  40 mg Intravenous Daily  . montelukast  10 mg Oral QHS  . pantoprazole  40 mg Oral Daily   Continuous Infusions: . cefTRIAXone (ROCEPHIN)  IV  1 g (06/21/18 1250)     LOS: 4 days   Time Spent in minutes   30 minutes  Vaishali Baise D.O. on 06/22/2018 at 10:44 AM  Between 7am to 7pm - Please see pager noted on [email or web address]  After 7pm go to [email or web address]  And look for the night coverage person covering for me after hours  [facility] Office  [phone]

## 2018-06-23 LAB — BASIC METABOLIC PANEL
Anion gap: 12 (ref 5–15)
BUN: 16 mg/dL (ref 8–23)
CO2: 23 mmol/L (ref 22–32)
Calcium: 8.5 mg/dL — ABNORMAL LOW (ref 8.9–10.3)
Chloride: 98 mmol/L (ref 98–111)
Creatinine, Ser: 0.78 mg/dL (ref 0.44–1.00)
GFR calc Af Amer: >60 mL/min (ref >60)
GFR calc non Af Amer: >60 mL/min (ref >60)
Glucose, Bld: 167 mg/dL — ABNORMAL HIGH (ref 70–99)
Potassium: 4.3 mmol/L (ref 3.5–5.1)
Sodium: 133 mmol/L — ABNORMAL LOW (ref 135–145)

## 2018-06-23 LAB — GLUCOSE, CAPILLARY
Glucose-Capillary: 156 mg/dL — ABNORMAL HIGH (ref 70–99)
Glucose-Capillary: 188 mg/dL — ABNORMAL HIGH (ref 70–99)
Glucose-Capillary: 259 mg/dL — ABNORMAL HIGH (ref 70–99)
Glucose-Capillary: 214 mg/dL — ABNORMAL HIGH (ref 70–99)

## 2018-06-23 NOTE — Progress Notes (Signed)
PROGRESS NOTE    [name]  ZOX:[id number] DOB: [birth year] DOA: 06/17/2018 PCP: Merrilee Seashore, MD   Brief Narrative:  HPI 06/17/2018 by Dr. Shela Leff [name] is a 84 y.o. female with medical history significant of COPD, CVA, depression, type 2 diabetes, GERD, hypertension, hypothyroidism, emphysema on chronic prednisone, scleroderma being sent to the hospital from her pulmonologist's office for evaluation of generalized weakness, low blood pressure, and jaw pain. Flu test negative and blood pressure 96/40 recorded at pulmonologist's office.  Patient states she had a fever of 103.6 F this morning and has been feeling fatigued.  States she was in her usual state of health yesterday and even shopped at [employer].  She has been coughing today and having postnasal drainage.  Reports having intermittent substernal chest pressure for the past 3 weeks even at rest which is associated with shortness of breath.  No associated nausea or diaphoresis.  Reports having pain on both sides of her jaw since this morning.  States she has had 4 heart attacks before and 3 stents.  Her cardiologist is Dr. Dorris Carnes.  Interim history Admitted with sepsis secondary to multifocal pneumonia.  Patient was [name] to have abnormal, suspected to be demand ischemia.  Cardiology consulted and is now signed off.   Assessment & Plan   Sepsis secondary to multifocal pneumonia -Presented with tachypnea, leukocytosis (WBC trending downward) -Chest x-ray reviewed, showing pulmonary opacities in the right upper and lower lobes -Blood cultures negative to date -Influenza PCR negative at pulmonology office per H&P -Continue azithromycin and ceftriaxone -Still requiring supplemental oxygen -Discussed with patient's pulmonologist, Dr. Halford Chessman- recommended steroids.  States that patient always presents with what appears to be a right upper lobe pneumonia that does not improve with antibiotics however does  improve with steroids. -Repeat CXR reviewed today, showing progressive infiltrates in the right lung consistent with pneumonia.  New pulmonary vascular congestion with small effusions. -Have started patient on prednisone, will continue to monitor for additional 24 hours.  Chest pain/jaw pain -Patient noted to have an abnormal EKG, T wave inversions in the inferior lateral leads -Troponin unremarkable -Patient had stress test in March 2019 which is low risk -Cardiology consulted and appreciated, feels EKG abnormalities likely due to demand ischemia due to infection and hypotension -Patient has had a history of statin intolerance, continue aspirin -Has had severe hypotension, beta-blockers avoided -Echocardiogram EF 60 to 81%, grade 2 diastolic dysfunction -Cardiology consulted and appreciated, recommending outpatient follow-up with no further ischemic evaluation at this time  Acute urinary retention -Patient has had frequent bladder scans showing urinary retention, and needing in and out catheterization.  Foley catheter was placed -Reviewed patient's home medications, she is on imipramine which may also cause some urinary retention.  She should follow-up with her PCP regarding weaning of this medication -Discussed with urology, recommended outpatient follow-up -Overnight, hole was noted in Foley catheter, therefore it was removed.  Continuing to monitor urine output, bladder scans.  Patient continues to have a decent amount of urine retention, will perform in and out if necessary.  Hypokalemia -Continue to replace and monitor   COPD -Currently without wheezing, appears to be stable -Continue bronchodilators  Diabetes mellitus, type II -Continue insulin sliding scale with CBG monitoring -hemoglobin A1c 7.2  Depression/insomnia -Continue imipramine, triazolam as needed  Hypothyroidism  -Continue Synthroid  GERD  -continue PPI  Deconditioning/Debility  -Appears to be frail and  chronically deconditioned at baseline however given her current sepsis and pneumonia, appears to  be weaker than usual -PT recommending home health as patient declined SNF  History of scleroderma -Patient on prednisone chronically 5 mg, will hold this for now and place on methylprednisone 40 mg  DVT Prophylaxis  lovenox  Code Status: Full  Family Communication: None at bedside  Disposition Plan: Admitted. Suspect home with home health when patient symptoms have improved, hopefully within 24 hours  Consultants Cardiology Urology, Dr. Diona Fanti via phone Pulmonology, Dr. Halford Chessman, via phone  Procedures  Echocardiogram  Antibiotics   Anti-infectives (From admission, onward)   Start     Dose/Rate Route Frequency Ordered Stop   06/21/18 1200  azithromycin (ZITHROMAX) tablet 500 mg     500 mg Oral  Once 06/21/18 1102 06/21/18 1250   06/18/18 2000  azithromycin (ZITHROMAX) 500 mg in sodium chloride 0.9 % 250 mL IVPB  Status:  Discontinued     500 mg 250 mL/hr over 60 Minutes Intravenous Every 24 hours 06/17/18 2214 06/21/18 1102   06/18/18 1800  cefTRIAXone (ROCEPHIN) 1 g in sodium chloride 0.9 % 100 mL IVPB     1 g 200 mL/hr over 30 Minutes Intravenous Every 24 hours 06/17/18 2214     06/17/18 1845  cefTRIAXone (ROCEPHIN) 1 g in sodium chloride 0.9 % 100 mL IVPB     1 g 200 mL/hr over 30 Minutes Intravenous  Once 06/17/18 1844 06/17/18 2016   06/17/18 1845  azithromycin (ZITHROMAX) 500 mg in sodium chloride 0.9 % 250 mL IVPB     500 mg 250 mL/hr over 60 Minutes Intravenous  Once 06/17/18 1844 06/17/18 2142      Subjective:   [name] seen and examined today.  Patient feeling better today.  States that she continues to have cough but feels that her shortness of breath has improved mildly but not back to normal.  She continues to need supplemental oxygen.  Currently denies chest pain, abdominal pain, nausea or vomiting, diarrhea or constipation, dizziness or headache.  Concerned  about her urinary retention.  Objective:   Vitals:   06/22/18 1416 06/22/18 1918 06/22/18 1949 06/23/18 0414  BP: (!) 140/58  (!) 141/126 (!) 151/55  Pulse: 77  81 75  Resp:   16 15  Temp: 98.6 F (37 C)  (!) 97.5 F (36.4 C) 97.7 F (36.5 C)  TempSrc: Oral  Oral Oral  SpO2: 94% 96% 92% 92%  Weight:    60.8 kg  Height:        Intake/Output Summary (Last 24 hours) at 06/23/2018 1248 Last data filed at 06/23/2018 1229 Gross per 24 hour  Intake 555 ml  Output 2250 ml  Net -1695 ml   Filed Weights   06/21/18 0455 06/22/18 0608 06/23/18 0414  Weight: 62.2 kg 61.5 kg 60.8 kg   Exam  General: Well developed, chronically ill-appearing, NAD  HEENT: NCAT, mucous membranes moist.   Neck: Supple  Cardiovascular: S1 S2 auscultated, no rubs, murmurs or gallops. Regular rate and rhythm.  Respiratory: Diminished breath sounds, occasional cough  Abdomen: Soft, nontender, nondistended, + bowel sounds  Extremities: warm dry without cyanosis clubbing or edema  Neuro: AAOx3, nonfocal  Psych: Pleasant, appropriate mood and affect  Data Reviewed: I have personally reviewed following labs and imaging studies  CBC: Recent Labs  Lab 06/17/18 1840 06/18/18 0305 06/19/18 0407  WBC 21.5* 14.1* 11.9*  NEUTROABS 18.2*  --   --   HGB 12.3 10.1* 10.2*  HCT 38.8 31.8* 32.1*  MCV 86.0 85.0 84.0  PLT 191 161  976   Basic Metabolic Panel: Recent Labs  Lab 06/17/18 1840 06/19/18 0407 06/20/18 0326 06/23/18 0713  NA 133* 135 135 133*  K 3.3* 3.2* 3.9 4.3  CL 95* 104 103 98  CO2 25 19* 22 23  GLUCOSE 184* 114* 114* 167*  BUN 25* 10 8 16   CREATININE 1.06* 0.67 0.71 0.78  CALCIUM 8.7* 7.9* 8.3* 8.5*   GFR: Estimated Creatinine Clearance: 44.2 mL/min (by C-G formula based on SCr of 0.78 mg/dL). Liver Function Tests: Recent Labs  Lab 06/17/18 1840  AST 20  ALT 15  ALKPHOS 43  BILITOT 1.0  PROT 6.3*  ALBUMIN 3.3*   No results for input(s): LIPASE, AMYLASE in the last  168 hours. No results for input(s): AMMONIA in the last 168 hours. Coagulation Profile: No results for input(s): INR, PROTIME in the last 168 hours. Cardiac Enzymes: Recent Labs  Lab 06/17/18 2116 06/18/18 0305 06/18/18 0931  TROPONINI <0.03 <0.03 <0.03   BNP (last 3 results) No results for input(s): PROBNP in the last 8760 hours. HbA1C: No results for input(s): HGBA1C in the last 72 hours. CBG: Recent Labs  Lab 06/22/18 1119 06/22/18 1611 06/22/18 2039 06/23/18 0722 06/23/18 1124  GLUCAP 130* 250* 290* 156* 188*   Lipid Profile: No results for input(s): CHOL, HDL, LDLCALC, TRIG, CHOLHDL, LDLDIRECT in the last 72 hours. Thyroid Function Tests: No results for input(s): TSH, T4TOTAL, FREET4, T3FREE, THYROIDAB in the last 72 hours. Anemia Panel: No results for input(s): VITAMINB12, FOLATE, FERRITIN, TIBC, IRON, RETICCTPCT in the last 72 hours. Urine analysis:    Component Value Date/Time   COLORURINE YELLOW 06/18/2018 0225   APPEARANCEUR CLEAR 06/18/2018 0225   LABSPEC 1.010 06/18/2018 0225   PHURINE 6.0 06/18/2018 0225   GLUCOSEU NEGATIVE 06/18/2018 0225   GLUCOSEU NEGATIVE 03/24/2010 1137   HGBUR NEGATIVE 06/18/2018 0225   BILIRUBINUR NEGATIVE 06/18/2018 0225   KETONESUR NEGATIVE 06/18/2018 0225   PROTEINUR NEGATIVE 06/18/2018 0225   UROBILINOGEN 0.2 05/01/2015 2019   NITRITE NEGATIVE 06/18/2018 0225   LEUKOCYTESUR NEGATIVE 06/18/2018 0225   Sepsis Labs: @LABRCNTIP (procalcitonin:4,lacticidven:4)  ) Recent Results (from the past 240 hour(s))  Culture, blood (routine x 2)     Status: None   Collection Time: 06/17/18  6:40 PM  Result Value Ref Range Status   Specimen Description BLOOD RIGHT ARM  Final   Special Requests   Final    BOTTLES DRAWN AEROBIC ONLY Blood Culture results may not be optimal due to an inadequate volume of blood received in culture bottles   Culture   Final    NO GROWTH 5 DAYS Performed at [facility], [place] [address],  [place], [place] [address]    Report Status 06/22/2018 FINAL  Final  Culture, blood (routine x 2)     Status: None   Collection Time: 06/17/18  6:40 PM  Result Value Ref Range Status   Specimen Description BLOOD LEFT ARM  Final   Special Requests   Final    BOTTLES DRAWN AEROBIC ONLY Blood Culture adequate volume   Culture   Final    NO GROWTH 5 DAYS Performed at [facility], [place] [address] [place], [place] [address]    Report Status 06/22/2018 FINAL  Final  MRSA PCR Screening     Status: None   Collection Time: 06/18/18  5:07 AM  Result Value Ref Range Status   MRSA by PCR NEGATIVE NEGATIVE Final    Comment:  The GeneXpert MRSA Assay (FDA approved for NASAL specimens only), is one component of a comprehensive MRSA colonization surveillance program. It is not intended to diagnose MRSA infection nor to guide or monitor treatment for MRSA infections. Performed at [facility], [place] [address] [place], [place] [address]       Radiology Studies: Dg Chest 2 View  Result Date: 06/22/2018 CLINICAL DATA:  Multifocal pneumonia. Sepsis. Shortness of breath. EXAM: CHEST - 2 VIEW COMPARISON:  06/17/2018 and 07/13/2017 FINDINGS: There has been slight progression of the infiltrates in the right lung. There is new pulmonary vascular congestion with slight distention of the azygos vein and new accentuation of the interstitial markings bilaterally as well as development of minimal bilateral pleural effusions. There is new pleural fluid along 1 of the major fissures on the lateral view. Bones are normal. Aortic atherosclerosis. IMPRESSION: 1. Progressive infiltrates in the right lung consistent with pneumonia. 2. New pulmonary vascular congestion, interstitial edema, and small effusions, suggesting congestive heart failure. Electronically Signed   By: Lorriane Shire M.D.   On: 06/22/2018 08:44     Scheduled Meds: . arformoterol  15 mcg Nebulization BID  . aspirin EC  81 mg Oral  Daily  . enoxaparin (LOVENOX) injection  40 mg Subcutaneous Q24H  . fluticasone  1 spray Each Nare Daily  . guaiFENesin  600 mg Oral BID  . imipramine  75 mg Oral QHS  . insulin lispro  0-9 Units Subcutaneous TID WC  . levothyroxine  100 mcg Oral QAC breakfast  . methylPREDNISolone (SOLU-MEDROL) injection  40 mg Intravenous Daily  . montelukast  10 mg Oral QHS  . pantoprazole  40 mg Oral Daily   Continuous Infusions: . cefTRIAXone (ROCEPHIN)  IV 1 g (06/22/18 1843)     LOS: 5 days   Time Spent in minutes   30 minutes  Taequan Stockhausen D.O. on 06/23/2018 at 12:48 PM  Between 7am to 7pm - Please see pager noted on [email or web address]  After 7pm go to [email or web address]  And look for the night coverage person covering for me after hours  [facility] Office  [phone]

## 2018-06-24 DIAGNOSIS — J438 Other emphysema: Secondary | ICD-10-CM

## 2018-06-24 DIAGNOSIS — E039 Hypothyroidism, unspecified: Secondary | ICD-10-CM

## 2018-06-24 LAB — GLUCOSE, CAPILLARY
Glucose-Capillary: 122 mg/dL — ABNORMAL HIGH (ref 70–99)
Glucose-Capillary: 197 mg/dL — ABNORMAL HIGH (ref 70–99)

## 2018-06-24 MED ORDER — PREDNISONE 10 MG PO TABS: ORAL_TABLET | ORAL | 0 refills | Status: DC

## 2018-06-24 MED ORDER — PREDNISONE 5 MG PO TABS: ORAL_TABLET | ORAL | 0 refills | Status: DC

## 2018-06-24 NOTE — Progress Notes (Signed)
SATURATION QUALIFICATIONS: (This note is used to comply with regulatory documentation for home oxygen)  Patient Saturations on Room Air at Rest = 95%  Patient Saturations on Room Air while Ambulating = 76%  Patient Saturations on 2 Liters of oxygen while Ambulating = 95%  Please briefly explain why patient needs home oxygen:Pt's oxygen level drops to mid 70s on RA when ambulating

## 2018-06-24 NOTE — Discharge Instructions (Signed)
Community-Acquired Pneumonia, Adult °Pneumonia is an infection of the lungs. There are different types of pneumonia. One type can develop while a person is in a hospital. A different type, called community-acquired pneumonia, develops in people who are not, or have not recently been, in the hospital or other health care facility. °What are the causes? ° °Pneumonia may be caused by bacteria, viruses, or funguses. Community-acquired pneumonia is often caused by Streptococcus pneumonia bacteria. These bacteria are often passed from one person to another by breathing in droplets from the cough or sneeze of an infected person. °What increases the risk? °The condition is more likely to develop in: °· People who have chronic diseases, such as chronic obstructive pulmonary disease (COPD), asthma, congestive heart failure, cystic fibrosis, diabetes, or kidney disease. °· People who have early-stage or late-stage HIV. °· People who have sickle cell disease. °· People who have had their spleen removed (splenectomy). °· People who have poor dental hygiene. °· People who have medical conditions that increase the risk of breathing in (aspirating) secretions their own mouth and nose. °· People who have a weakened immune system (immunocompromised). °· People who smoke. °· People who travel to areas where pneumonia-causing germs commonly exist. °· People who are around animal habitats or animals that have pneumonia-causing germs, including birds, bats, rabbits, cats, and farm animals. °What are the signs or symptoms? °Symptoms of this condition include: °· A dry cough. °· A wet (productive) cough. °· Fever. °· Sweating. °· Chest pain, especially when breathing deeply or coughing. °· Rapid breathing or difficulty breathing. °· Shortness of breath. °· Shaking chills. °· Fatigue. °· Muscle aches. °How is this diagnosed? °Your health care provider will take a medical history and perform a physical exam. You may also have other tests,  including: °· Imaging studies of your chest, including X-rays. °· Tests to check your blood oxygen level and other blood gases. °· Other tests on blood, mucus (sputum), fluid around your lungs (pleural fluid), and urine. °If your pneumonia is severe, other tests may be done to identify the specific cause of your illness. °How is this treated? °The type of treatment that you receive depends on many factors, such as the cause of your pneumonia, the medicines you take, and other medical conditions that you have. For most adults, treatment and recovery from pneumonia may occur at home. In some cases, treatment must happen in a hospital. Treatment may include: °· Antibiotic medicines, if the pneumonia was caused by bacteria. °· Antiviral medicines, if the pneumonia was caused by a virus. °· Medicines that are given by mouth or through an IV tube. °· Oxygen. °· Respiratory therapy. °Although rare, treating severe pneumonia may include: °· Mechanical ventilation. This is done if you are not breathing well on your own and you cannot maintain a safe blood oxygen level. °· Thoracentesis. This procedure removes fluid around one lung or both lungs to help you breathe better. °Follow these instructions at home: ° °· Take over-the-counter and prescription medicines only as told by your health care provider. °? Only take cough medicine if you are losing sleep. Understand that cough medicine can prevent your body’s natural ability to remove mucus from your lungs. °? If you were prescribed an antibiotic medicine, take it as told by your health care provider. Do not stop taking the antibiotic even if you start to feel better. °· Sleep in a semi-upright position at night. Try sleeping in a reclining chair, or place a few pillows under your head. °· Do not use tobacco products, including cigarettes, chewing tobacco, and e-cigarettes.   If you need help quitting, ask your health care provider. °· Drink enough water to keep your urine  clear or pale yellow. This will help to thin out mucus secretions in your lungs. °How is this prevented? °There are ways that you can decrease your risk of developing community-acquired pneumonia. Consider getting a pneumococcal vaccine if: °· You are older than 82 years of age. °· You are older than 82 years of age and are undergoing cancer treatment, have chronic lung disease, or have other medical conditions that affect your immune system. Ask your health care provider if this applies to you. °There are different types and schedules of pneumococcal vaccines. Ask your health care provider which vaccination option is best for you. °You may also prevent community-acquired pneumonia if you take these actions: °· Get an influenza vaccine every year. Ask your health care provider which type of influenza vaccine is best for you. °· Go to the dentist on a regular basis. °· Wash your hands often. Use hand sanitizer if soap and water are not available. °Contact a health care provider if: °· You have a fever. °· You are losing sleep because you cannot control your cough with cough medicine. °Get help right away if: °· You have worsening shortness of breath. °· You have increased chest pain. °· Your sickness becomes worse, especially if you are an older adult or have a weakened immune system. °· You cough up blood. °This information is not intended to replace advice given to you by your health care provider. Make sure you discuss any questions you have with your health care provider. °Document Released: 06/12/2005 Document Revised: 03/01/2017 Document Reviewed: 10/07/2014 °Elsevier Interactive Patient Education © 2019 [employer] ° °

## 2018-06-24 NOTE — Care Management Note (Signed)
Case Management Note Initial CM Note was documented by Clark  Patient Details  Name: [name] MRN: [id number] Date of Birth: 09-Feb-1931  Subjective/Objective:   Pt presented for SOB and weakness. Pt is legally blind and home alone. Patient states she has support of daughter. Neighbors and friends assist with transportation and to Yoga. Pt is active with [facility] (Palliative Services). PT recommendations for [facility] PT- pt is agreeable to services.                  Action/Plan: CM did make referral with Dan of [facility] and [place] to begin within 24-48 hours post transition home. No further needs from CM at this time.   Expected Discharge Date:  06/24/18               Expected Discharge Plan:  [place]  In-House Referral:  NA  Discharge planning Services  CM Consult  Post Acute Care Choice:  Home Health, Durable Medical Equipment Choice offered to:  Patient  DME Arranged:  Oxygen DME Agency:  [place]:  PT [facility] Agency:  [place]  Status of Service:  Completed, signed off  If discussed at [place] of Stay Meetings, dates discussed:    Additional Comments: 06/14/18 @ 1147-Kirbie Stodghill RNCM-Call received that patient will require home O2 for transition home. Patient preference is [facility] with DME referral given to [facility], [facility] liaison, with a portable tank to be delivered in patients room prior to discharge; AVS updated. Dan RN, [facility] liaison, alerted that patient will transition home today. No further needs from CM.   Midge Minium RN, BSN, NCM-BC, ACM-RN [phone] 06/24/2018, 11:46 AM

## 2018-06-25 DIAGNOSIS — Z8701 Personal history of pneumonia (recurrent): Secondary | ICD-10-CM | POA: Diagnosis not present

## 2018-06-25 DIAGNOSIS — R351 Nocturia: Secondary | ICD-10-CM | POA: Diagnosis not present

## 2018-06-25 DIAGNOSIS — Z7952 Long term (current) use of systemic steroids: Secondary | ICD-10-CM | POA: Diagnosis not present

## 2018-06-25 DIAGNOSIS — Z794 Long term (current) use of insulin: Secondary | ICD-10-CM | POA: Diagnosis not present

## 2018-06-25 DIAGNOSIS — Z79891 Long term (current) use of opiate analgesic: Secondary | ICD-10-CM | POA: Diagnosis not present

## 2018-06-25 DIAGNOSIS — J449 Chronic obstructive pulmonary disease, unspecified: Secondary | ICD-10-CM | POA: Diagnosis not present

## 2018-06-25 DIAGNOSIS — E119 Type 2 diabetes mellitus without complications: Secondary | ICD-10-CM | POA: Diagnosis not present

## 2018-06-25 DIAGNOSIS — Z9981 Dependence on supplemental oxygen: Secondary | ICD-10-CM | POA: Diagnosis not present

## 2018-06-25 DIAGNOSIS — Z7951 Long term (current) use of inhaled steroids: Secondary | ICD-10-CM | POA: Diagnosis not present

## 2018-06-25 DIAGNOSIS — Z7982 Long term (current) use of aspirin: Secondary | ICD-10-CM | POA: Diagnosis not present

## 2018-06-25 DIAGNOSIS — N3941 Urge incontinence: Secondary | ICD-10-CM | POA: Diagnosis not present

## 2018-06-25 DIAGNOSIS — R350 Frequency of micturition: Secondary | ICD-10-CM | POA: Diagnosis not present

## 2018-06-27 DIAGNOSIS — J449 Chronic obstructive pulmonary disease, unspecified: Secondary | ICD-10-CM | POA: Diagnosis not present

## 2018-06-27 DIAGNOSIS — Z9981 Dependence on supplemental oxygen: Secondary | ICD-10-CM | POA: Diagnosis not present

## 2018-06-27 DIAGNOSIS — Z794 Long term (current) use of insulin: Secondary | ICD-10-CM | POA: Diagnosis not present

## 2018-06-27 DIAGNOSIS — E119 Type 2 diabetes mellitus without complications: Secondary | ICD-10-CM | POA: Diagnosis not present

## 2018-06-27 DIAGNOSIS — Z8701 Personal history of pneumonia (recurrent): Secondary | ICD-10-CM | POA: Diagnosis not present

## 2018-06-27 DIAGNOSIS — Z7951 Long term (current) use of inhaled steroids: Secondary | ICD-10-CM | POA: Diagnosis not present

## 2018-06-28 DIAGNOSIS — E119 Type 2 diabetes mellitus without complications: Secondary | ICD-10-CM | POA: Diagnosis not present

## 2018-06-28 DIAGNOSIS — Z9981 Dependence on supplemental oxygen: Secondary | ICD-10-CM | POA: Diagnosis not present

## 2018-06-28 DIAGNOSIS — J449 Chronic obstructive pulmonary disease, unspecified: Secondary | ICD-10-CM | POA: Diagnosis not present

## 2018-06-28 DIAGNOSIS — Z794 Long term (current) use of insulin: Secondary | ICD-10-CM | POA: Diagnosis not present

## 2018-06-28 DIAGNOSIS — Z7951 Long term (current) use of inhaled steroids: Secondary | ICD-10-CM | POA: Diagnosis not present

## 2018-06-28 DIAGNOSIS — Z8701 Personal history of pneumonia (recurrent): Secondary | ICD-10-CM | POA: Diagnosis not present

## 2018-07-01 DIAGNOSIS — E119 Type 2 diabetes mellitus without complications: Secondary | ICD-10-CM | POA: Diagnosis not present

## 2018-07-01 DIAGNOSIS — Z8701 Personal history of pneumonia (recurrent): Secondary | ICD-10-CM | POA: Diagnosis not present

## 2018-07-01 DIAGNOSIS — J449 Chronic obstructive pulmonary disease, unspecified: Secondary | ICD-10-CM | POA: Diagnosis not present

## 2018-07-01 DIAGNOSIS — Z794 Long term (current) use of insulin: Secondary | ICD-10-CM | POA: Diagnosis not present

## 2018-07-01 DIAGNOSIS — Z9981 Dependence on supplemental oxygen: Secondary | ICD-10-CM | POA: Diagnosis not present

## 2018-07-01 DIAGNOSIS — Z7951 Long term (current) use of inhaled steroids: Secondary | ICD-10-CM | POA: Diagnosis not present

## 2018-07-02 DIAGNOSIS — J449 Chronic obstructive pulmonary disease, unspecified: Secondary | ICD-10-CM | POA: Diagnosis not present

## 2018-07-02 DIAGNOSIS — Z794 Long term (current) use of insulin: Secondary | ICD-10-CM | POA: Diagnosis not present

## 2018-07-02 DIAGNOSIS — Z7951 Long term (current) use of inhaled steroids: Secondary | ICD-10-CM | POA: Diagnosis not present

## 2018-07-02 DIAGNOSIS — E119 Type 2 diabetes mellitus without complications: Secondary | ICD-10-CM | POA: Diagnosis not present

## 2018-07-02 DIAGNOSIS — Z8701 Personal history of pneumonia (recurrent): Secondary | ICD-10-CM | POA: Diagnosis not present

## 2018-07-02 DIAGNOSIS — Z9981 Dependence on supplemental oxygen: Secondary | ICD-10-CM | POA: Diagnosis not present

## 2018-07-03 DIAGNOSIS — Z794 Long term (current) use of insulin: Secondary | ICD-10-CM | POA: Diagnosis not present

## 2018-07-03 DIAGNOSIS — J449 Chronic obstructive pulmonary disease, unspecified: Secondary | ICD-10-CM | POA: Diagnosis not present

## 2018-07-03 DIAGNOSIS — Z8701 Personal history of pneumonia (recurrent): Secondary | ICD-10-CM | POA: Diagnosis not present

## 2018-07-03 DIAGNOSIS — E119 Type 2 diabetes mellitus without complications: Secondary | ICD-10-CM | POA: Diagnosis not present

## 2018-07-03 DIAGNOSIS — Z9981 Dependence on supplemental oxygen: Secondary | ICD-10-CM | POA: Diagnosis not present

## 2018-07-03 DIAGNOSIS — Z7951 Long term (current) use of inhaled steroids: Secondary | ICD-10-CM | POA: Diagnosis not present

## 2018-07-05 ENCOUNTER — Telehealth: Payer: Self-pay | Admitting: Pulmonary Disease

## 2018-07-05 ENCOUNTER — Ambulatory Visit (INDEPENDENT_AMBULATORY_CARE_PROVIDER_SITE_OTHER): Payer: Medicare Other | Admitting: Pulmonary Disease

## 2018-07-05 ENCOUNTER — Encounter: Payer: Self-pay | Admitting: Pulmonary Disease

## 2018-07-05 ENCOUNTER — Ambulatory Visit (INDEPENDENT_AMBULATORY_CARE_PROVIDER_SITE_OTHER)
Admission: RE | Admit: 2018-07-05 | Discharge: 2018-07-05 | Disposition: A | Discharge status: Home / Self Care | Payer: Medicare Other | Source: Ambulatory Visit | Attending: Pulmonary Disease | Admitting: Pulmonary Disease

## 2018-07-05 VITALS — BP 134/60 | HR 89 | Temp 100.0°F

## 2018-07-05 DIAGNOSIS — R0602 Shortness of breath: Secondary | ICD-10-CM

## 2018-07-05 DIAGNOSIS — J310 Chronic rhinitis: Secondary | ICD-10-CM | POA: Diagnosis not present

## 2018-07-05 DIAGNOSIS — J8489 Other specified interstitial pulmonary diseases: Secondary | ICD-10-CM

## 2018-07-05 DIAGNOSIS — J181 Lobar pneumonia, unspecified organism: Secondary | ICD-10-CM | POA: Diagnosis not present

## 2018-07-05 DIAGNOSIS — J329 Chronic sinusitis, unspecified: Secondary | ICD-10-CM | POA: Diagnosis not present

## 2018-07-05 MED ORDER — PREDNISONE 10 MG PO TABS: ORAL_TABLET | ORAL | 0 refills | Status: DC

## 2018-07-05 MED ORDER — AMOXICILLIN-POT CLAVULANATE 875-125 MG PO TABS: 1.0000 | ORAL_TABLET | Freq: Two times a day (BID) | ORAL | 0 refills | Status: DC

## 2018-07-05 NOTE — Assessment & Plan Note (Signed)
Plan: Continue Flonase Prednisone taper today Nettie pot 2-3 times a day

## 2018-07-05 NOTE — Progress Notes (Signed)
Chest x-ray looks like it is actually improved from last chest x-ray in December/2019.  Continue with plan of care as outlined today.  If symptoms worsen you need to present to an emergency room or contact 911.  Please contact our office on Monday and let us know how you are doing.  [name], FNP

## 2018-07-05 NOTE — Patient Instructions (Addendum)
Augmentin  >>> Take 1 875-125 mg tablet every 12 hours for the next 10 days >>> Take with food  Prednisone 10mg  tablet  >>>4 tabs for 2 days, then 3 tabs for 2 days, 2 tabs for 2 days, then 1 tab for 2 days, then resume 5mg  daily  >>>take with food  >>>take in the morning   Please proceed to the [place] location for chest x-ray >>> Chest x-ray has been ordered >>>[address], [place], [place]  Continue Flonase daily  continue Singulair daily Can do Nettie pot 2-3 times a day to help with nasal secretions  Can take Tylenol for fevers at home   Contact our office on Monday to let us know how you are doing  If symptoms worsen please present to the emergency room or contact 911  It is flu season:   >>>Remember to be washing your hands regularly, using hand sanitizer, be careful to use around herself with has contact with people who are sick will increase her chances of getting sick yourself. >>> Best ways to protect herself from the flu: Receive the yearly flu vaccine, practice good hand hygiene washing with soap and also using hand sanitizer when available, eat a nutritious meals, get adequate rest, hydrate appropriately   Please contact the office if your symptoms worsen or you have concerns that you are not improving.   Thank you for choosing [facility] for your healthcare, and for allowing [place] to partner with you on your healthcare journey. I am thankful to be able to provide care to you today.   [name] FNP-C

## 2018-07-05 NOTE — Assessment & Plan Note (Addendum)
Assessment: 3 days of increased nasal congestion, sinus pain and pressure Febrile today at a temperature of 100 Maxillary and frontal sinuses tender to palpation bilaterally Nasal passages with copious amounts of brown mucus  Plan: Augmentin Prednisone taper Flonase today Nettie pot 2-3 times a day Make sure you are hydrated well Patient to contact our office on Monday to let us know how she is doing If symptoms worsen she will need to present to an emergency room

## 2018-07-05 NOTE — Assessment & Plan Note (Addendum)
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

## 2018-07-05 NOTE — Addendum Note (Signed)
Addended by: Della Goo C on: 07/05/2018 02:00 PM   Modules accepted: Orders

## 2018-07-05 NOTE — Telephone Encounter (Signed)
She needs to come in for an appointment with one of the NPs.  She would need lab work and a chest xray to determine if she can be treated with outpatient antibiotics and higher dose of prednisone, or if she needs to go back to the hospital.

## 2018-07-05 NOTE — Telephone Encounter (Signed)
Called and spoke with Almyra Free the nurse who was currently still with the patient. Advised her of response from VS below. Patient has been scheduled to see Aaron Edelman this afternoon. Order has been placed for a cxr. Patients daughter was with her and is aware of appointment time and location. She is also aware that she will need to go to [address] (our old office) to have the chest xray completed. Patients daughter and the nurse Almyra Free verbalized understanding. Nothing further needed.

## 2018-07-05 NOTE — Progress Notes (Signed)
@Patient  ID: [name], female    DOB: 1931/01/29, 85 y.o.   MRN: [id number]  Chief Complaint  Patient presents with  . Acute Visit    Sinus pain, fever    Referring provider: Merrilee Seashore, MD  HPI:  85 year old female former smoker followed in our office for abnormal CT (probable Boop), emphysema  PMH: Scleroderma (managed by Dr. Estanislado Pandy), chronic sinusitis (managed by Dr. Lucia Gaskins with ENT) Smoker/ Smoking History: Former smoker.  40-pack-year smoking history. Maintenance: 5 mg prednisone Pt of: Dr. Halford Chessman   07/05/2018  - Visit   85 year old female former smoker presenting to our office today for an acute visit.  Patient was discharged from the hospital on 06/24/2018 with a right lower lobe pneumonia.  Patient reports that she was treated with antibiotics in the hospital chart review looks like this with ceftriaxone and azithromycin as well as discharged on a prednisone taper under the guidance of Dr. Halford Chessman.  Patient reports that she finished her prednisone taper 2 days ago.  While taking the prednisone patient was feeling quite well was up moving around, was less fatigued with this even cooking for herself.  Patient has been evaluated by physical therapy and patient felt that she was improving.  Patient stopped taking the prednisone on 07/03/2018.  Also on 07/03/2018 symptoms developed of increased sinus pain and congestion, increased cough, increased fatigue, as well as a temperature of 101.6.  Patient is concerned that she may have a sinus infection that is worsening.  Patient and family who are with patient believe the patient is having a chronic sinus infection as she gets these quite often.     Tests:  06/22/2018-chest x-ray-progressive infiltrates in the right lung consistent with pneumonia, new pulmonary vascular congestion interstitial edema and small effusions suggesting congestive heart failure  07/09/2017-CT Angio-negative for acute PE, mild groundglass density in  the posterior right upper lobe could relate to minimal residual inflammation or infiltrate   FENO:  No results found for: NITRICOXIDE  PFT: No flowsheet data found.  Imaging: Dg Chest 2 View  Result Date: 06/22/2018 CLINICAL DATA:  Multifocal pneumonia. Sepsis. Shortness of breath. EXAM: CHEST - 2 VIEW COMPARISON:  06/17/2018 and 07/13/2017 FINDINGS: There has been slight progression of the infiltrates in the right lung. There is new pulmonary vascular congestion with slight distention of the azygos vein and new accentuation of the interstitial markings bilaterally as well as development of minimal bilateral pleural effusions. There is new pleural fluid along 1 of the major fissures on the lateral view. Bones are normal. Aortic atherosclerosis. IMPRESSION: 1. Progressive infiltrates in the right lung consistent with pneumonia. 2. New pulmonary vascular congestion, interstitial edema, and small effusions, suggesting congestive heart failure. Electronically Signed   By: Lorriane Shire M.D.   On: 06/22/2018 08:44   Dg Chest 2 View  Result Date: 06/17/2018 CLINICAL DATA:  Dyspnea with productive cough and fever. EXAM: CHEST - 2 VIEW COMPARISON:  07/13/2017 FINDINGS: Heart size and mediastinal contours are stable with aortic atherosclerosis. Surgical clips project over the base of the neck on the left prior left thyroid lobectomy. Asymmetric airspace opacities are identified about the right hilum, right upper and lower lobe predominant compatible with multilobar pneumonia, less likely unilateral CHF. Left lung remains relatively clear. No acute osseous abnormality. IMPRESSION: Pulmonary opacities about the right hilum, right upper and lower lobes compatible with multilobar pneumonia, less likely unilateral CHF given history of fever and cough. Electronically Signed   By: Ashley Royalty  M.D.   On: 06/17/2018 18:25      Specialty Problems      Pulmonary Problems   Chronic rhinitis            EMPHYSEMA     No clinical benefit from inhaler therapy Quit smoking 1999        BOOP (bronchiolitis obliterans with organizing pneumonia) ([place])           COPD (chronic obstructive pulmonary disease) ([place])   CAP (community acquired pneumonia)   Acute bronchitis   HCAP (healthcare-associated pneumonia)   Hypoxia   SOB (shortness of breath), secondary to Boop   Multifocal pneumonia   Recurrent sinus infections      Allergies  Allergen Reactions  . Sulfa Antibiotics Other (See Comments)    Other reaction(s): Other (See Comments) Other Reaction: anaphalaxsis  Other Reaction: anaphalaxsis   . Lantus [Insulin Glargine]     Patient states "sulfate binder causes sinus infection/pneumonia".   Hook [Levofloxacin In D5w] Other (See Comments)    Muscle aches  . Maxidex [Dexamethasone] Other (See Comments)    Insomnia, anxiety, dizziness  . Metoprolol-Hydrochlorothiazide Other (See Comments)     decreased bp  . Norvasc [Amlodipine] Other (See Comments)    BIL Ankle edema  . Novolog [Insulin Aspart]     Patient states "sulfate binder causes sinus infection/pneumonia". Tolerates Humalog.  . Peanut Oil     Other reaction(s): Rash And throat tightening  . Penciclovir     Other reaction(s): Muscle Pain  . Senna [Sennosides] Other (See Comments)    Pt states that it causes a stinging sensation  . Sulfonamide Derivatives Hives  . Titanium Other (See Comments)    Neck wouldn't heal with titanium cervical plate  . Yellow Dye     Other reaction(s): Bee stings all over body when eating/taking medications with red or yellow dye   . Adhesive [Tape] Hives and Rash    Immunization History  Administered Date(s) Administered  . Influenza Split 07/11/2011, 03/31/2015  . Influenza, High Dose Seasonal PF 07/14/2017  . Influenza,inj,Quad PF,6+ Mos 07/18/2013, 03/24/2016  . Pneumococcal Polysaccharide-23 07/18/2013    Past Medical History:  Diagnosis Date  . Adenomatous colon  polyp   . Arthritis   . Blood transfusion   . Cancer [facility])    cervical cancer pre hysterectomy  . Cervical disc disease   . Chronic sinusitis   . CONSTIPATION, CHRONIC 06/26/2007   Qualifier: Diagnosis of  By: Nils Pyle CMA ([place]), Mearl Latin    . COPD (chronic obstructive pulmonary disease) ([place])   . Coronary artery disease   . CVA (cerebral infarction) 1991  . Depression   . Diabetes mellitus   . Diverticulosis   . 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 Dyslipidemia   . Emphysema   . Fibromyalgia   . GERD (gastroesophageal reflux disease)   . Hypertension   . Hypothyroidism   . IBS (irritable bowel syndrome)   . Legally blind   . Macular degeneration   . Myocardial infarction [facility]) may 27th 2007  . Pneumonia 07/16/2013    HX PNEUMONIA  . Pulmonary hypertension ([place])   . Raynaud's phenomenon   . Scleroderma ([place])   . Sjogren's disease ([place])   . Sleep apnea    STOP BANG SCORE 5  . Stroke [facility]) 1990   brain stem stroke - weakness rt hand  . Zenker's diverticulum     Tobacco History: Social History   Tobacco Use  Smoking Status Former Smoker  . Packs/day:  1.00  . Years: 40.00  . Pack years: 40.00  . Types: Cigarettes  . Last attempt to quit: 06/26/1988  . Years since quitting: 30.0  Smokeless Tobacco Never Used  Tobacco Comment   40 pack years   Counseling given: Not Answered Comment: 40 pack years  Continue to not smoke  Outpatient Encounter Medications as of 07/05/2018  Medication Sig  . aspirin EC 81 MG tablet Take 1 tablet (81 mg total) by mouth daily.  [name] esomeprazole (NEXIUM) 40 MG capsule Take 40 mg by mouth daily.   . fluticasone (FLONASE) 50 MCG/ACT nasal spray Place 1 spray into both nostrils daily.  [name] HYDROcodone-acetaminophen (NORCO) 7.5-325 MG tablet Take 1 tablet by mouth every 6 (six) hours as needed for moderate pain.  [name] imipramine (TOFRANIL) 25 MG tablet Take 75 mg by mouth at bedtime.   . insulin lispro (HUMALOG KWIKPEN) 100 UNIT/ML KiwkPen Inject 5-6  Units into the skin 3 (three) times daily with meals. Inject 6 units subcutaneously with breakfast and lunch, inject 5 units with supper  . levothyroxine (SYNTHROID, LEVOTHROID) 100 MCG tablet Take 100 mcg by mouth daily before breakfast.  . montelukast (SINGULAIR) 10 MG tablet Take 10 mg by mouth at bedtime.   . nitrofurantoin (MACRODANTIN) 100 MG capsule Take 100 mg by mouth 2 (two) times daily.  . predniSONE (DELTASONE) 5 MG tablet TAKE 1 TABLET ONCE A DAY WITH BREAKFAST. Schedule office visit for additional refills  . triazolam (HALCION) 0.25 MG tablet Take 0.25 mg by mouth at bedtime as needed for sleep. For sleep  . amoxicillin-clavulanate (AUGMENTIN) 875-125 MG tablet Take 1 tablet by mouth 2 (two) times daily.  . predniSONE (DELTASONE) 10 MG tablet 4 tabs for 2 days, then 3 tabs for 2 days, 2 tabs for 2 days, then 1 tab for 2 days, then stop  . [DISCONTINUED] predniSONE (DELTASONE) 10 MG tablet Take with breakfast. Take 4 tabs x 1 day, then 3 tabs x 2 days, then 2 tabs x 2 days, then 1 tab x 2 days. (Patient not taking: Reported on 07/05/2018)   No facility-administered encounter medications on file as of 07/05/2018.      Review of Systems  Review of Systems  Constitutional: Positive for fatigue and fever (T: 101.65 on 07/03/2018, 101 this am, 100 in office). Negative for activity change and appetite change.  HENT: Positive for congestion (brown mucous started on 07/03/2018), sinus pressure and sinus pain (worsened over past 3 days ). Negative for trouble swallowing.   Eyes:       + Puffy eyes  Respiratory: Positive for cough (wet sounding, not productive yet) and shortness of breath (worsened over past few days). Negative for wheezing.   Cardiovascular: Negative for chest pain and palpitations.  Gastrointestinal: Negative for diarrhea, nausea and vomiting.     Physical Exam  BP 134/60 (BP Location: Left Arm, Cuff Size: Normal)   Pulse 89   Temp 100 F (37.8 C) (Oral)   SpO2 92%    Wt Readings from Last 5 Encounters:  06/24/18 136 lb (61.7 kg)  06/17/18 129 lb (58.5 kg)  04/04/18 134 lb (60.8 kg)  11/28/17 129 lb (58.5 kg)  09/17/17 134 lb 9.6 oz (61.1 kg)    Physical Exam  Constitutional: She is oriented to person, place, and time and well-developed, well-nourished, and in no distress. She has a sickly appearance. No distress.  + Febrile frail elderly female  HENT:  Head: Normocephalic and atraumatic.  Right Ear: Hearing, external  ear and ear canal normal.  Left Ear: Hearing, external ear and ear canal normal.  Nose: Mucosal edema present. Right sinus exhibits maxillary sinus tenderness and frontal sinus tenderness. Left sinus exhibits maxillary sinus tenderness and frontal sinus tenderness.  Mouth/Throat: Uvula is midline and oropharynx is clear and moist. No oropharyngeal exudate.  + TMs with effusion without infection bilaterally + Copious amounts of brown mucus in both nasal passages  Eyes: Pupils are equal, round, and reactive to light.  Neck: Normal range of motion. Neck supple.  Cardiovascular: Normal rate, regular rhythm and normal heart sounds.  Pulmonary/Chest: Effort normal and breath sounds normal. No accessory muscle usage. No respiratory distress. She has no decreased breath sounds. She has no wheezes. She has no rhonchi.  +noisy chest, few exp squeaks  Musculoskeletal:        General: Edema (trace bilaterally) present.     Comments: +in wheelchair today  Lymphadenopathy:    She has no cervical adenopathy.  Neurological: She is alert and oriented to person, place, and time.  Skin: Skin is warm and dry. She is not diaphoretic. No erythema.  Psychiatric: Mood, memory, affect and judgment normal.  Nursing note and vitals reviewed.     Lab Results:  CBC    Component Value Date/Time   WBC 11.9 (H) 06/19/2018 0407   RBC 3.82 (L) 06/19/2018 0407   HGB 10.2 (L) 06/19/2018 0407   HGB 10.8 (L) 10/31/2016 1006   HCT 32.1 (L) 06/19/2018 0407    HCT 34.1 10/31/2016 1006   PLT 150 06/19/2018 0407   PLT 363 10/31/2016 1006   MCV 84.0 06/19/2018 0407   MCV 77 (L) 10/31/2016 1006   MCH 26.7 06/19/2018 0407   MCHC 31.8 06/19/2018 0407   RDW 13.5 06/19/2018 0407   RDW 15.0 10/31/2016 1006   LYMPHSABS 1.7 06/17/2018 1840   MONOABS 1.3 (H) 06/17/2018 1840   EOSABS 0.1 06/17/2018 1840   BASOSABS 0.1 06/17/2018 1840    BMET    Component Value Date/Time   NA 133 (L) 06/23/2018 0713   NA 135 09/22/2016 0956   K 4.3 06/23/2018 0713   CL 98 06/23/2018 0713   CO2 23 06/23/2018 0713   GLUCOSE 167 (H) 06/23/2018 0713   BUN 16 06/23/2018 0713   BUN 33 (H) 09/22/2016 0956   CREATININE 0.78 06/23/2018 0713   CREATININE 0.80 03/30/2016 1042   CALCIUM 8.5 (L) 06/23/2018 0713   GFRNONAA >60 06/23/2018 0713   GFRNONAA 64 01/22/2015 1635   GFRAA >60 06/23/2018 0713   GFRAA 74 01/22/2015 1635    BNP    Component Value Date/Time   BNP 214.7 (H) 07/13/2017 1047    ProBNP    Component Value Date/Time   PROBNP 150.0 (H) 02/02/2015 1156      Assessment & Plan:   Recurrent sinus infections Assessment: 3 days of increased nasal congestion, sinus pain and pressure Febrile today at a temperature of 100 Maxillary and frontal sinuses tender to palpation bilaterally Nasal passages with copious amounts of brown mucus  Plan: Augmentin Prednisone taper Patient to contact our office on Monday to let us know how she is doing If symptoms worsen she will need to present to an emergency room   BOOP (bronchiolitis obliterans with organizing pneumonia) ([place]) Assessment: History of Boop typically in right upper lobe  Plan: Chest x-ray today   Discussed case with Dr. Halford Chessman.  Dr. Halford Chessman confirms the patient does get recurrent sinus infections especially after prednisone is tapered down.  Lauraine Rinne, NP 07/05/2018   This appointment was 35 minutes long with over 50% of the time in direct face-to-face patient care, assessment,  plan of care, and follow-up.

## 2018-07-05 NOTE — Telephone Encounter (Signed)
[name] from [facility] is calling back wanting to know if patient needs to go to the hospital. CB# [phone]

## 2018-07-05 NOTE — Telephone Encounter (Signed)
Spoke with [name] from [facility]. She is currently with the patient in her home. Patient was recently in the hospital on 06/17/18 for a stay of 7 days. She was diagnosed with PNA, weakness.   Patient's current vital signs: BP 150/40, Pulse: 100 Temp: 101.22F and she is on 2 L of O2. She is currently have body aches all over. Even with the 2L of O2, she is gasping for air with the smallest movements.   [name] wants to know what VS recommends next for the patient. VS, please advise. Thanks!

## 2018-07-08 NOTE — Progress Notes (Signed)
Patient can do nasal saline rinses or Nettie Potts to help with nasal dryness and prevention of chronic sinusitis.  We will just have to continue to monitor the patient.  If the patient needs oxygen based on the requirements then will have to maintain this.  Keep follow-up with our office.  [name], FNP

## 2018-07-08 NOTE — Progress Notes (Signed)
Reviewed and agree with assessment/plan.   Lupita Rosales, MD [facility] 06/21/2016, 12:24 PM Pager:  [phone]  

## 2018-07-10 ENCOUNTER — Telehealth: Payer: Self-pay | Admitting: Internal Medicine

## 2018-07-10 ENCOUNTER — Encounter: Payer: Self-pay | Admitting: Physician Assistant

## 2018-07-10 DIAGNOSIS — J449 Chronic obstructive pulmonary disease, unspecified: Secondary | ICD-10-CM | POA: Diagnosis not present

## 2018-07-10 DIAGNOSIS — Z9981 Dependence on supplemental oxygen: Secondary | ICD-10-CM | POA: Diagnosis not present

## 2018-07-10 DIAGNOSIS — Z7951 Long term (current) use of inhaled steroids: Secondary | ICD-10-CM | POA: Diagnosis not present

## 2018-07-10 DIAGNOSIS — Z8701 Personal history of pneumonia (recurrent): Secondary | ICD-10-CM | POA: Diagnosis not present

## 2018-07-10 DIAGNOSIS — Z794 Long term (current) use of insulin: Secondary | ICD-10-CM | POA: Diagnosis not present

## 2018-07-10 DIAGNOSIS — E119 Type 2 diabetes mellitus without complications: Secondary | ICD-10-CM | POA: Diagnosis not present

## 2018-07-10 NOTE — Telephone Encounter (Signed)
Last week had UTI, started ABT Sinus inf this week - pulm started another antibiotic Today BP elevated. Pt states this happens sometimes. Recently down to 90/40. Pt reports sometimes low and goes crazy high. Cant take many things for it due to allergies/intolerances PT Eustaquio Maize) saw her today.  Were planning to dc her prior to these recent illnesses. Now going to add nursing visits as well.   Had cuff in home that was not accurately reading compared to manual cuff. Daughter brought a new cuff to the house today to keep track of BPs. Has appointment with APP in cardiology on 07/02/18.

## 2018-07-10 NOTE — Telephone Encounter (Signed)
° ° ° ° °  Pt c/o BP issue: STAT if pt c/o blurred vision, one-sided weakness or slurred speech  1. What are your last 5 BP readings? 186/72  2. Are you having any other symptoms (ex. Dizziness, headache, blurred vision, passed out)? FATIGUE  3. What is your BP issue? Beth from [facility] calling to report elevated BP, no symptoms, phone [phone]

## 2018-07-10 NOTE — Progress Notes (Addendum)
Cardiology Office Note    Date:  07/12/2018  ID:  [name], DOB 26-May-1931, MRN [id number] PCP:  Merrilee Seashore, MD  Cardiologist:  Dorris Carnes, MD   Chief Complaint: labile BP  History of Present Illness:  [name] is a 85 y.o. female with history of CAD (prior MI with stenting of RCA with repeat PTCA/stenting for ISR in 01/2008, stent to LM 11/2008), prior Dressler's syndrome post-MI, fibromyalgia, multiple medication intolerances, BOOP (followed by pulmonary), chronic diverticulitis, Scleroderma, Sjogren's, chronic steroid use, sleep apnea, hyperlipidemia, hypertension, stroke in 1990s, pulmonary hypertension, anemia, depression, GERD, type 2 diabetes who presents for f/u BP. Last cath 2011 showed 40% mid LAD lesion, 30% diag, 50% Lcx, 50% OM3, small OM1 with 80% lesion, RCA with 20% instent restenosis, 30% distal RCA. She had a normal stress test 08/2017. She was recently admitted 05/2018 for sepsis due to multifocal pneumonia. She has required home O2 at DC. Other issues included jaw pain with negative troponins, urinary retention, and debilitation. SNF was recommended but the patient declined. 2D echo 06/18/18 showed EF 60-65%, grade 2 DD, moderate LAE, mild TR. Cardiology saw the patient for jaw pain and did not feel any further cardiac workup was necessary. Last labs 06/23/18 showed Na 133 (intermittently low), K 4.3, Cr 0.78, Hgb 10.2, A1c 7.2.   She returns for follow-up overall feeling better, making steady progress but still weak. She is hopeful to be able to come off O2 soon. She will f/u Dr. Halford Chessman for this. She wants to discuss her BP. In the hospital it was 90/40 when she presented. Ever since she's come home it's been creeping up. Home health checked it a few days ago in the 170s but she got readings in the 140s yesterday. Today in the office it is 140/60. She has been on a steroid taper with higher dose of prednisone than her prior maintenance dose. Today is the last  day of the higher doses. No CP, SOB, orthopnea. Daughter [name] is with her today.   Past Medical History:  Diagnosis Date  . Adenomatous colon polyp   . Arthritis   . Blood transfusion   . Cancer [facility])    cervical cancer pre hysterectomy  . Cervical disc disease   . Chronic sinusitis   . CONSTIPATION, CHRONIC 06/26/2007   Qualifier: Diagnosis of  By: Nils Pyle CMA ([place]), Mearl Latin    . COPD (chronic obstructive pulmonary disease) ([place])   . Coronary artery disease    prior MI with stenting of RCA with repeat PTCA/stenting for ISR in 01/2008, stent to LM 11/2008  . CVA (cerebral infarction) 1991  . Depression   . Diabetes mellitus   . Diverticulosis   . 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[place])   . Dyslipidemia   . Emphysema   . Fibromyalgia   . GERD (gastroesophageal reflux disease)   . Hypertension   . Hypothyroidism   . IBS (irritable bowel syndrome)   . Legally blind   . Macular degeneration   . Myocardial infarction [facility]) may 27th 2007  . Pneumonia 07/16/2013    HX PNEUMONIA  . Pulmonary hypertension ([place])   . Raynaud's phenomenon   . Scleroderma ([place])   . Sjogren's disease ([place])   . Sleep apnea    STOP BANG SCORE 5  . Stroke [facility]) 1990   brain stem stroke - weakness rt hand  . Zenker's diverticulum     Past Surgical History:  Procedure Laterality Date  . APPENDECTOMY    .  BALLOON DILATION N/A 07/15/2015   Procedure: BALLOON DILATION;  Surgeon: Milus Banister, MD;  Location: [facility];  Service: Endoscopy;  Laterality: N/A;  . BILATERAL OOPHORECTOMY  2002  . BRONCHOSCOPY  02-22-09  . CARDIAC CATHETERIZATION     x 3  . CATARACT EXTRACTION    . CHOLECYSTECTOMY  1965  . COLON SURGERY  2014   colon resection  . colonectomy    . ESOPHAGOGASTRODUODENOSCOPY (EGD) WITH ESOPHAGEAL DILATION N/A 06/18/2013   Procedure: ESOPHAGOGASTRODUODENOSCOPY (EGD) WITH ESOPHAGEAL DILATION;  Surgeon: Inda Castle, MD;  Location: [place];  Service: Endoscopy;  Laterality: N/A;  . FLEXIBLE  SIGMOIDOSCOPY N/A 07/15/2015   Procedure: FLEXIBLE SIGMOIDOSCOPY;  Surgeon: Milus Banister, MD;  Location: [facility];  Service: Endoscopy;  Laterality: N/A;  . lobe thyroid removal    . PROCTOSCOPY  03/18/2012   Procedure: PROCTOSCOPY;  Surgeon: Adin Hector, MD;  Location: [facility];  Service: General;  Laterality: N/A;  rigid procto  . RADIOLOGY WITH ANESTHESIA N/A 03/09/2015   Procedure: MRI CERVICAL SPINE WITHOUT AND LUMBER WITHOUT;  Surgeon: Medication Radiologist, MD;  Location: [place];  Service: Radiology;  Laterality: N/A;  . [place]   and implantation of a plate   . STENTED CARDIAC  ARTERY  2007 / 2007 / 2010   X 3 STENT  . THYROID LOBECTOMY  1991   removal of left lobe thyroid  . TOTAL ABDOMINAL HYSTERECTOMY  1973    Current Medications: Current Meds  Medication Sig  . amoxicillin-clavulanate (AUGMENTIN) 875-125 MG tablet Take 1 tablet by mouth 2 (two) times daily.  [name] aspirin EC 81 MG tablet Take 1 tablet (81 mg total) by mouth daily.  [name] esomeprazole (NEXIUM) 40 MG capsule Take 40 mg by mouth daily.   . fluticasone (FLONASE) 50 MCG/ACT nasal spray Place 1 spray into both nostrils daily.  [name] HYDROcodone-acetaminophen (NORCO) 7.5-325 MG tablet Take 1 tablet by mouth every 6 (six) hours as needed for moderate pain.  [name] imipramine (TOFRANIL) 25 MG tablet Take 75 mg by mouth at bedtime.   . insulin lispro (HUMALOG KWIKPEN) 100 UNIT/ML KiwkPen Inject 5-6 Units into the skin 3 (three) times daily with meals. Inject 6 units subcutaneously with breakfast and lunch, inject 5 units with supper  . levothyroxine (SYNTHROID, LEVOTHROID) 100 MCG tablet Take 100 mcg by mouth daily before breakfast.  . montelukast (SINGULAIR) 10 MG tablet Take 10 mg by mouth at bedtime.   . predniSONE (DELTASONE) 10 MG tablet 4 tabs for 2 days, then 3 tabs for 2 days, 2 tabs for 2 days, then 1 tab for 2 days, then stop  . predniSONE (DELTASONE) 5 MG tablet TAKE 1 TABLET ONCE A DAY WITH BREAKFAST  .  triazolam (HALCION) 0.25 MG tablet Take 0.25 mg by mouth at bedtime as needed for sleep. For sleep      Allergies:   Sulfa antibiotics; Lantus [insulin glargine]; Levaquin [levofloxacin in d5w]; Maxidex [dexamethasone]; Metoprolol-hydrochlorothiazide; Norvasc [amlodipine]; Novolog [insulin aspart]; Peanut oil; Penciclovir; Senna [sennosides]; Sulfonamide derivatives; Titanium; Yellow dye; and Adhesive [tape]   Social History   Socioeconomic History  . Marital status: Divorced    Spouse name: Not on file  . Number of children: Not on file  . Years of education: Not on file  . Highest education level: Not on file  Occupational History  . Occupation: retired    Fish farm manager: RETIRED  Social Needs  . Financial resource strain: Not on file  . Food insecurity:  Worry: Not on file    Inability: Not on file  . Transportation needs:    Medical: Not on file    Non-medical: Not on file  Tobacco Use  . Smoking status: Former Smoker    Packs/day: 1.00    Years: 40.00    Pack years: 40.00    Types: Cigarettes    Last attempt to quit: 06/26/1988    Years since quitting: 30.0  . Smokeless tobacco: Never Used  . Tobacco comment: 40 pack years  Substance and Sexual Activity  . Alcohol use: No  . Drug use: No  . Sexual activity: Not on file  Lifestyle  . Physical activity:    Days per week: Not on file    Minutes per session: Not on file  . Stress: Not on file  Relationships  . Social connections:    Talks on phone: Patient refused    Gets together: Patient refused    Attends religious service: Patient refused    Active member of club or organization: Patient refused    Attends meetings of clubs or organizations: Patient refused    Relationship status: Patient refused  Other Topics Concern  . Not on file  Social History Narrative  . Not on file     Family History:  The patient's family history includes Cancer in her brother and sister; Diabetes in her brother, brother, father, and  sister; Heart disease in her father; Other (age of onset: 46) in her mother; Stroke (age of onset: 53) in her father.  ROS:   Please see the history of present illness.  All other systems are reviewed and otherwise negative.    PHYSICAL EXAM:   VS:  BP 140/60   Pulse 74   Ht 5\' 2"  (1.575 m)   Wt 130 lb 12.8 oz (59.3 kg)   SpO2 99%   BMI 23.92 kg/m   BMI: Body mass index is 23.92 kg/m. GEN: Well nourished, well developed elderly WF, in no acute distress. Wearing sunglasses HEENT: normocephalic, atraumatic Neck: no JVD, carotid bruits, or masses Cardiac: RRR; no murmurs, rubs, or gallops, no edema  Respiratory:  clear to auscultation bilaterally, normal work of breathing - on [place] O2 GI: soft, nontender, nondistended, + BS MS: no deformity or atrophy Skin: warm and dry, no rash Neuro:  Alert and Oriented x 3, Strength and sensation are intact, follows commands Psych: euthymic mood, full affect  Wt Readings from Last 3 Encounters:  07/12/18 130 lb 12.8 oz (59.3 kg)  06/24/18 136 lb (61.7 kg)  06/17/18 129 lb (58.5 kg)      Studies/Labs Reviewed:   EKG:   EKG was not ordered today.  Recent Labs: 07/13/2017: B Natriuretic Peptide 214.7 06/17/2018: ALT 15 06/19/2018: Hemoglobin 10.2; Platelets 150 06/23/2018: BUN 16; Creatinine, Ser 0.78; Potassium 4.3; Sodium 133   Lipid Panel    Component Value Date/Time   CHOL 226 (H) 07/14/2017 0842   TRIG 154 (H) 07/14/2017 0842   HDL 55 07/14/2017 0842   CHOLHDL 4.1 07/14/2017 0842   VLDL 31 07/14/2017 0842   LDLCALC 140 (H) 07/14/2017 0842   LDLDIRECT 127.6 05/15/2013 1422    Additional studies/ records that were reviewed today include: Summarized above.   ASSESSMENT & PLAN:   1. Essential HTN (labile) - she was concerned that BP ranged from 16/07 to 371 systolic. I explained to her the 90/40 was in the setting of sepsis and not really reflective of a normal BP. She likely has experienced  some BP spikes related to higher  doses of prednisone. She reports a tendency to have a lower BP, particularly in the diastolics. Given her age, frailty, and recent higher steroid use I would be hesitant about aggressively treating these occasional spikes. Furthermore she has a multitude of medication intolerances and reports being extremely sensitive to medicines, even small doses. We will follow conservatively for now. I asked her to monitor BP at home once she's back on maintenance steroid dose and call if BP tending to run >833 systolic. Perhaps a low dose of losartan would be appropriate. 2. CAD - asymptomatic. Continue ASA. Not on BB due to prior intolerance. Unwilling to consider treatment for cholesterol given prior intolerances. 3. Hyperlipidemia - as above. Intolerant of therapy and does not wish for treatment. 4. History of pulmonary HTN - normal PASP on recent echo.  Disposition: F/u with Dr. Harrington Challenger as scheduled 09/2018.   Medication Adjustments/Labs and Tests Ordered: Current medicines are reviewed at length with the patient today.  Concerns regarding medicines are outlined above. Medication changes, Labs and Tests ordered today are summarized above and listed in the Patient Instructions accessible in Encounters.   Signed, Charlie Pitter, PA-C  07/12/2018 9:13 AM    [facility] [place], [place], [place]  [address] Phone: [phone]; Fax: [phone]

## 2018-07-11 ENCOUNTER — Telehealth: Payer: Self-pay | Admitting: Internal Medicine

## 2018-07-11 ENCOUNTER — Other Ambulatory Visit: Payer: Self-pay | Admitting: Pulmonary Disease

## 2018-07-11 ENCOUNTER — Ambulatory Visit: Payer: Medicare Other | Admitting: Physician Assistant

## 2018-07-11 DIAGNOSIS — Z7951 Long term (current) use of inhaled steroids: Secondary | ICD-10-CM | POA: Diagnosis not present

## 2018-07-11 DIAGNOSIS — Z9981 Dependence on supplemental oxygen: Secondary | ICD-10-CM | POA: Diagnosis not present

## 2018-07-11 DIAGNOSIS — E119 Type 2 diabetes mellitus without complications: Secondary | ICD-10-CM | POA: Diagnosis not present

## 2018-07-11 DIAGNOSIS — Z794 Long term (current) use of insulin: Secondary | ICD-10-CM | POA: Diagnosis not present

## 2018-07-11 DIAGNOSIS — Z8701 Personal history of pneumonia (recurrent): Secondary | ICD-10-CM | POA: Diagnosis not present

## 2018-07-11 DIAGNOSIS — J449 Chronic obstructive pulmonary disease, unspecified: Secondary | ICD-10-CM | POA: Diagnosis not present

## 2018-07-11 NOTE — Telephone Encounter (Signed)
  [name] from [place] is calling to notify Dr Harrington Challenger that [name] BP left arm 152/56 and right arm 170/50 today. On 5/48/84 her systolic bp got up to 573 and after about 10 min it went to 176. She also states that she can hear crackles in patient's lower lobes. Patient is complaining of recent onset of some left sided weakness but she is not sure how long she has had it. Patient is not showing any other symptoms.

## 2018-07-11 NOTE — Telephone Encounter (Signed)
Spoke with Donita and she states that pt's SBP has been elevated the last 2 days.  Today 152/56 in left arm and 170/50 in right arm.  Yesterday SBP was 186 and then 176 10 minutes later.  Donita states pt is asymptomatic and did not appear to have had a recent stroke to explain the left sided weakness.  HR today was 66 and regular.  Donita seen pt two weeks ago and lungs were clear.  States she can hear slight crackles in the base of lungs.  Advised I will send message to Dr. Harrington Challenger for review.

## 2018-07-11 NOTE — Telephone Encounter (Signed)
Pt has been on prednisone for pulmonary exacerbation REcomm  BMET, BNP and CBC  Please make sure pt is set up to be seen in [place]

## 2018-07-12 ENCOUNTER — Encounter: Payer: Self-pay | Admitting: Physician Assistant

## 2018-07-12 ENCOUNTER — Ambulatory Visit (INDEPENDENT_AMBULATORY_CARE_PROVIDER_SITE_OTHER): Payer: Medicare Other | Admitting: Physician Assistant

## 2018-07-12 VITALS — BP 140/60 | HR 74 | Ht 62.0 in | Wt 130.8 lb

## 2018-07-12 DIAGNOSIS — I2510 Atherosclerotic heart disease of native coronary artery without angina pectoris: Secondary | ICD-10-CM

## 2018-07-12 DIAGNOSIS — E785 Hyperlipidemia, unspecified: Secondary | ICD-10-CM | POA: Diagnosis not present

## 2018-07-12 DIAGNOSIS — R0989 Other specified symptoms and signs involving the circulatory and respiratory systems: Secondary | ICD-10-CM

## 2018-07-12 DIAGNOSIS — I272 Pulmonary hypertension, unspecified: Secondary | ICD-10-CM | POA: Diagnosis not present

## 2018-07-12 NOTE — Telephone Encounter (Signed)
Pt was seen this morning by D. Dunn, PA-C. No labs were ordered today. Has f/u with PR in April.

## 2018-07-12 NOTE — Patient Instructions (Signed)
Medication Instructions:  Your physician recommends that you continue on your current medications as directed. Please refer to the Current Medication list given to you today.'  If you need a refill on your cardiac medications before your next appointment, please call your pharmacy.   Lab work: None ordered 3 If you have labs (blood work) drawn today and your tests are completely normal, you will receive your results only by: [name] MyChart Message (if you have MyChart) OR . A paper copy in the mail If you have any lab test that is abnormal or we need to change your treatment, we will call you to review the results.  Testing/Procedures: None ordered  Follow-Up: Your physician recommends that you schedule a follow-up appointment in: [place] DR. ROSS IN APRIL  Any Other Special Instructions Will Be Listed Below (If Applicable). Monitor your blood pressure, call if it continues to run 140 or highter on the top in the days following tapering the Prednisone

## 2018-07-15 DIAGNOSIS — Z8701 Personal history of pneumonia (recurrent): Secondary | ICD-10-CM | POA: Diagnosis not present

## 2018-07-15 DIAGNOSIS — Z794 Long term (current) use of insulin: Secondary | ICD-10-CM | POA: Diagnosis not present

## 2018-07-15 DIAGNOSIS — E119 Type 2 diabetes mellitus without complications: Secondary | ICD-10-CM | POA: Diagnosis not present

## 2018-07-15 DIAGNOSIS — Z9981 Dependence on supplemental oxygen: Secondary | ICD-10-CM | POA: Diagnosis not present

## 2018-07-15 DIAGNOSIS — J449 Chronic obstructive pulmonary disease, unspecified: Secondary | ICD-10-CM | POA: Diagnosis not present

## 2018-07-15 DIAGNOSIS — Z7951 Long term (current) use of inhaled steroids: Secondary | ICD-10-CM | POA: Diagnosis not present

## 2018-07-17 ENCOUNTER — Ambulatory Visit (INDEPENDENT_AMBULATORY_CARE_PROVIDER_SITE_OTHER): Payer: Medicare Other | Admitting: Acute Care

## 2018-07-17 ENCOUNTER — Ambulatory Visit (INDEPENDENT_AMBULATORY_CARE_PROVIDER_SITE_OTHER)
Admission: RE | Admit: 2018-07-17 | Discharge: 2018-07-17 | Disposition: A | Discharge status: Home / Self Care | Payer: Medicare Other | Source: Ambulatory Visit | Attending: Acute Care | Admitting: Acute Care

## 2018-07-17 ENCOUNTER — Encounter: Payer: Self-pay | Admitting: Acute Care

## 2018-07-17 VITALS — BP 140/60 | HR 64 | Temp 98.0°F | Ht 62.0 in | Wt 131.0 lb

## 2018-07-17 DIAGNOSIS — J810 Acute pulmonary edema: Secondary | ICD-10-CM | POA: Diagnosis not present

## 2018-07-17 DIAGNOSIS — J189 Pneumonia, unspecified organism: Secondary | ICD-10-CM

## 2018-07-17 DIAGNOSIS — I2510 Atherosclerotic heart disease of native coronary artery without angina pectoris: Secondary | ICD-10-CM

## 2018-07-17 DIAGNOSIS — J329 Chronic sinusitis, unspecified: Secondary | ICD-10-CM | POA: Diagnosis not present

## 2018-07-17 DIAGNOSIS — J90 Pleural effusion, not elsewhere classified: Secondary | ICD-10-CM | POA: Diagnosis not present

## 2018-07-17 DIAGNOSIS — R0902 Hypoxemia: Secondary | ICD-10-CM

## 2018-07-17 DIAGNOSIS — N912 Amenorrhea, unspecified: Secondary | ICD-10-CM | POA: Insufficient documentation

## 2018-07-17 NOTE — Assessment & Plan Note (Addendum)
Continued sinus pain and pressure Afebrile today in the office Treatment with antibiotics : 12/23-12/30>> Ceftriaxone and Azithro, pred taper 1/10>> Augmentin x 10 days 12/31 >> UTI Macrobid x 10 days States the oxygen she was discharged with is giving her sinus infection  Plan: We will walk you today to see if you still need your oxygen with ambulation.>> saturation low with ambulation was 97% today. We will call [facility] and have them discontinue the oxygen. Ayr gel spray per each nare daily as needed . CXR today CBC today BMET today. We will call with labs and CXR result Continue Singulair 10 mg daily Continue prednisone 5 mg daily Flonase 2 sprays each nare once daily Follow up with Dr. Halford Chessman or Judson Roch in 2 weeks Referral to Dr. Radene Journey ENT>> chronic sinusitis Consider getting a new PCP if you are not happy with the doctor you currently have. Please contact office for sooner follow up if symptoms do not improve or worsen or seek emergency care   Would prefer to avoid antibiotics as she has frequent treatments for the last month. Just completed 10 days of Augmentin 3 days ago.

## 2018-07-17 NOTE — Assessment & Plan Note (Signed)
Resolved Walked today with max desat of 97% on RA Plan DC oxygen

## 2018-07-17 NOTE — Patient Instructions (Addendum)
It is nice to see you today. We will walk you today to see if you still need your oxygen with ambulation.>> saturation low with ambulation was 97% today. We will call [facility] and have them discontinue the oxygen. Ayr gel spray per each nare daily as needed . CXR today CBC today BMET today. We will call with labs and CXR result Continue Singulair 10 mg daily Continue prednisone 5 mg daily Follow up with Dr. Halford Chessman or Judson Roch in 2 weeks Referral to Dr. Radene Journey ENT>> chronic sinusitis Consider getting a new PCP if you are not happy with the doctor you currently have. Please contact office for sooner follow up if symptoms do not improve or worsen or seek emergency care

## 2018-07-17 NOTE — Progress Notes (Signed)
History of Present Illness [name] is a 85 y.o. 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 She is a patient of Dr. Halford Chessman.  PMH: Scleroderma (managed by Dr. Estanislado Pandy), chronic sinusitis (managed by Dr. Lucia Gaskins with ENT) Smoker/ Smoking History: Former smoker.  40-pack-year smoking history. Maintenance: 5 mg prednisone   07/17/2018  Acute OV Pt. Was last seen in the office 07/05/18 for right lower lobe pneumonia.  She was treated with antibiotics in the hospital 06/17/2018-06/24/2018 per chart review looks like ceftriaxone and azithromycin were treatment. She was  discharged on a prednisone taper under the guidance of Dr. Halford Chessman. Patient stopped taking the prednisone on 07/03/2018.  Also on 07/03/2018 symptoms developed of increased sinus pain and congestion, increased cough, increased fatigue, as well as a temperature of 101.6.  Patient was  concerned that she may have a sinus infection that was worsening.Per Dr. Halford Chessman, patient does tend to get recurrent sinus infections especially after prednisone is tapered down.She was treated on 1/10 with Augmentin x 10 days and a prednisone taper.She completed this coarse of antibiotics 07/15/2018 ( 2 days ago). She is on her last day of prednisone.She was also treated 12/31 for a UTI with Macrobid x 10 days.She returns today with complaints of low grade fever and sinus infection. She is complaining of runny nose. She states her symptoms have not resolved since she was last here on 1/10. She states she is feeling better, but her oxygen is causing her nose to be dry. She feels the oxygen is causing a sinus infection.She states she does not want to wear her oxygen at home. She is compliant with her prednisone 5 mg daily.She has been less energetic since discharge from the hospital. Per the patient this  is not like her. She wonders if she has some mild depression.She also states she has had some memory issues since hospitalization.She denies chest pain, orthopnea or hemoptysis.  Test Results: 07/05/2018 CXR Improving multifocal pneumonia in the right lung. Unchanged mild interstitial pulmonary edema and small bilateral pleural effusions.  Tests:  06/22/2018-chest x-ray-progressive infiltrates in the right lung consistent with pneumonia, new pulmonary vascular congestion interstitial edema and small effusions suggesting congestive heart failure  07/09/2017-CT Angio-negative for acute PE, mild groundglass density in the posterior right upper lobe could relate to minimal residual inflammation or infiltrate   CBC Latest Ref Rng & Units 06/19/2018 06/18/2018 06/17/2018  WBC 4.0 - 10.5 K/uL 11.9(H) 14.1(H) 21.5(H)  Hemoglobin 12.0 - 15.0 g/dL 10.2(L) 10.1(L) 12.3  Hematocrit 36.0 - 46.0 % 32.1(L) 31.8(L) 38.8  Platelets 150 - 400 K/uL 150 161 191    BMP Latest Ref Rng & Units 06/23/2018 06/20/2018 06/19/2018  Glucose 70 - 99 mg/dL 167(H) 114(H) 114(H)  BUN 8 - 23 mg/dL 16 8 10   Creatinine 0.44 - 1.00 mg/dL 0.78 0.71 0.67  BUN/Creat Ratio 12 - 28 - - -  Sodium 135 - 145 mmol/L 133(L) 135 135  Potassium 3.5 - 5.1 mmol/L 4.3 3.9 3.2(L)  Chloride 98 - 111 mmol/L 98 103 104  CO2 22 - 32 mmol/L 23 22 19(L)  Calcium 8.9 - 10.3 mg/dL 8.5(L) 8.3(L) 7.9(L)    BNP    Component Value Date/Time   BNP 214.7 (H) 07/13/2017 1047    ProBNP    Component Value Date/Time   PROBNP 150.0 (H) 02/02/2015 1156  PFT No results found for: FEV1PRE, FEV1POST, FVCPRE, FVCPOST, TLC, DLCOUNC, PREFEV1FVCRT, PSTFEV1FVCRT  Dg Chest 2 View  Result Date: 07/17/2018 CLINICAL DATA:  Evaluate for pneumonia. EXAM: CHEST - 2 VIEW COMPARISON:  07/05/2018. FINDINGS: Normal heart size. Lungs appear hyperinflated with diffuse coarsened interstitial markings bilaterally. There is been interval resolution of previous  pulmonary edema and pleural effusions. Significant improved aeration to both lungs noted compatible with resolving pneumonia. IMPRESSION: 1. Improved aeration of both lungs compatible with resolving pneumonia. 2. Resolution of previous pulmonary edema and pleural effusions. Electronically Signed   By: Kerby Moors M.D.   On: 07/17/2018 17:21   Dg Chest 2 View  Result Date: 07/05/2018 CLINICAL DATA:  Pneumonia follow-up. EXAM: CHEST - 2 VIEW COMPARISON:  Chest x-ray dated June 22, 2018. FINDINGS: The heart size and mediastinal contours are within normal limits. Diffuse interstitial edema is similar to prior study. Improving patchy airspace disease in the right lung. Unchanged small bilateral pleural effusions. No pneumothorax. No acute osseous abnormality. IMPRESSION: 1. Improving multifocal pneumonia in the right lung. 2. Unchanged mild interstitial pulmonary edema and small bilateral pleural effusions. Electronically Signed   By: Titus Dubin M.D.   On: 07/05/2018 16:24   Dg Chest 2 View  Result Date: 06/22/2018 CLINICAL DATA:  Multifocal pneumonia. Sepsis. Shortness of breath. EXAM: CHEST - 2 VIEW COMPARISON:  06/17/2018 and 07/13/2017 FINDINGS: There has been slight progression of the infiltrates in the right lung. There is new pulmonary vascular congestion with slight distention of the azygos vein and new accentuation of the interstitial markings bilaterally as well as development of minimal bilateral pleural effusions. There is new pleural fluid along 1 of the major fissures on the lateral view. Bones are normal. Aortic atherosclerosis. IMPRESSION: 1. Progressive infiltrates in the right lung consistent with pneumonia. 2. New pulmonary vascular congestion, interstitial edema, and small effusions, suggesting congestive heart failure. Electronically Signed   By: Lorriane Shire M.D.   On: 06/22/2018 08:44   Dg Chest 2 View  Result Date: 06/17/2018 CLINICAL DATA:  Dyspnea with productive  cough and fever. EXAM: CHEST - 2 VIEW COMPARISON:  07/13/2017 FINDINGS: Heart size and mediastinal contours are stable with aortic atherosclerosis. Surgical clips project over the base of the neck on the left prior left thyroid lobectomy. Asymmetric airspace opacities are identified about the right hilum, right upper and lower lobe predominant compatible with multilobar pneumonia, less likely unilateral CHF. Left lung remains relatively clear. No acute osseous abnormality. IMPRESSION: Pulmonary opacities about the right hilum, right upper and lower lobes compatible with multilobar pneumonia, less likely unilateral CHF given history of fever and cough. Electronically Signed   By: Ashley Royalty M.D.   On: 06/17/2018 18:25     Past medical hx Past Medical History:  Diagnosis Date  . Adenomatous colon polyp   . Arthritis   . Blood transfusion   . Cancer [facility])    cervical cancer pre hysterectomy  . Cervical disc disease   . Chronic sinusitis   . CONSTIPATION, CHRONIC 06/26/2007   Qualifier: Diagnosis of  By: Nils Pyle CMA ([place]), Mearl Latin    . COPD (chronic obstructive pulmonary disease) ([place])   . Coronary artery disease    prior MI with stenting of RCA with repeat PTCA/stenting for ISR in 01/2008, stent to LM 11/2008  . CVA (cerebral infarction) 1991  . Depression   . Diabetes mellitus   . Diverticulosis   . 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[place])   . Dyslipidemia   . Emphysema   .  Fibromyalgia   . GERD (gastroesophageal reflux disease)   . Hypertension   . Hypothyroidism   . IBS (irritable bowel syndrome)   . Legally blind   . Macular degeneration   . Myocardial infarction [facility]) may 27th 2007  . Pneumonia 07/16/2013    HX PNEUMONIA  . Pulmonary hypertension ([place])   . Raynaud's phenomenon   . Scleroderma ([place])   . Sjogren's disease ([place])   . Sleep apnea    STOP BANG SCORE 5  . Stroke [facility]) 1990   brain stem stroke - weakness rt hand  . Zenker's diverticulum      Social History   Tobacco Use  .  Smoking status: Former Smoker    Packs/day: 1.00    Years: 40.00    Pack years: 40.00    Types: Cigarettes    Last attempt to quit: 06/26/1988    Years since quitting: 30.0  . Smokeless tobacco: Never Used  . Tobacco comment: 40 pack years  Substance Use Topics  . Alcohol use: No  . Drug use: No    Ms.Greff reports that she quit smoking about 30 years ago. Her smoking use included cigarettes. She has a 40.00 pack-year smoking history. She has never used smokeless tobacco. She reports that she does not drink alcohol or use drugs.  Tobacco Cessation: Quit smoking 06/26/1988 with a 40 pack year smoking history  Past surgical hx, Family hx, Social hx all reviewed.  Current Outpatient Medications on File Prior to Visit  Medication Sig  . aspirin EC 81 MG tablet Take 1 tablet (81 mg total) by mouth daily.  [name] esomeprazole (NEXIUM) 40 MG capsule Take 40 mg by mouth daily.   . fluticasone (FLONASE) 50 MCG/ACT nasal spray Place 1 spray into both nostrils daily.  [name] HYDROcodone-acetaminophen (NORCO) 7.5-325 MG tablet Take 1 tablet by mouth every 6 (six) hours as needed for moderate pain.  [name] imipramine (TOFRANIL) 25 MG tablet Take 75 mg by mouth at bedtime.   . insulin lispro (HUMALOG KWIKPEN) 100 UNIT/ML KiwkPen Inject 5-6 Units into the skin 3 (three) times daily with meals. Inject 6 units subcutaneously with breakfast and lunch, inject 5 units with supper  . levothyroxine (SYNTHROID, LEVOTHROID) 100 MCG tablet Take 100 mcg by mouth daily before breakfast.  . montelukast (SINGULAIR) 10 MG tablet Take 10 mg by mouth at bedtime.   . predniSONE (DELTASONE) 10 MG tablet 4 tabs for 2 days, then 3 tabs for 2 days, 2 tabs for 2 days, then 1 tab for 2 days, then stop  . predniSONE (DELTASONE) 5 MG tablet TAKE 1 TABLET ONCE A DAY WITH BREAKFAST  . triazolam (HALCION) 0.25 MG tablet Take 0.25 mg by mouth at bedtime as needed for sleep. For sleep   No current facility-administered medications on file  prior to visit.      Allergies  Allergen Reactions  . Sulfa Antibiotics Other (See Comments)    Other reaction(s): Other (See Comments) Other Reaction: anaphalaxsis  Other Reaction: anaphalaxsis   . Lantus [Insulin Glargine]     Patient states "sulfate binder causes sinus infection/pneumonia".  [place]ack [place] [Levofloxacin In D5w] Other (See Comments)    Muscle aches  . Maxidex [Dexamethasone] Other (See Comments)    Insomnia, anxiety, dizziness  . Metoprolol-Hydrochlorothiazide Other (See Comments)     decreased bp  . Norvasc [Amlodipine] Other (See Comments)    BIL Ankle edema  . Novolog [Insulin Aspart]     Patient states "sulfate binder causes sinus infection/pneumonia".  Tolerates Humalog.  . Peanut Oil     Other reaction(s): Rash And throat tightening  . Penciclovir     Other reaction(s): Muscle Pain  . Senna [Sennosides] Other (See Comments)    Pt states that it causes a stinging sensation  . Statins     All of them make her feel bad  . Sulfonamide Derivatives Hives  . Titanium Other (See Comments)    Neck wouldn't heal with titanium cervical plate  . Yellow Dye     Other reaction(s): Bee stings all over body when eating/taking medications with red or yellow dye   . Adhesive [Tape] Hives and Rash    Review Of Systems:  Constitutional:   No  weight loss, night sweats,  Low grade Fevers, chills, +fatigue, or  lassitude.  HEENT:   No headaches,  Difficulty swallowing,  Tooth/dental problems, or  Sore throat,                No sneezing, itching, ear ache,+ nasal congestion,+ post nasal drip, dry sinuses  CV:  No chest pain,  Orthopnea, PND, swelling in lower extremities, anasarca, dizziness, palpitations, syncope.   GI  No heartburn, indigestion, abdominal pain, nausea, vomiting, diarrhea, change in bowel habits, loss of appetite, bloody stools.   Resp: No shortness of breath with exertion or at rest.  No excess mucus, no productive cough,  No non-productive cough,   No coughing up of blood.  No change in color of mucus.  No wheezing.  No chest wall deformity  Skin: no rash or lesions.Warm dry and intact  GU: no dysuria, change in color of urine, no urgency or frequency.  No flank pain, no hematuria   MS:  No joint pain or swelling.  No decreased range of motion.  No back pain.  Psych:  No change in mood or affect. No depression or anxiety.  No memory loss.   Vital Signs BP 140/60 (BP Location: Left Arm, Cuff Size: Normal)   Pulse 64   Temp 98 F (36.7 C) (Oral)   Ht 5\' 2"  (1.575 m)   Wt 131 lb (59.4 kg)   SpO2 97%   BMI 23.96 kg/m    Physical Exam:  General- No distress,  A&Ox3, pleasant ENT: + sinus tenderness, TM clear,dry  pale nasal mucosa, no oral exudate,+ post nasal drip, no LAN Cardiac: S1, S2, regular rate and rhythm, no murmur Chest: No wheeze/ rales/ dullness; no accessory muscle use, no nasal flaring, no sternal retractions Abd.: Soft Non-tender, ND, BS +, Body mass index is 23.96 kg/m. Ext: No clubbing cyanosis, edema Neuro:  Generalized deconditioning, MAE x 4, A&O x 3, appropriate, per daughter forgetful at times. Skin: No rashes,no lesions, warm and dry Psych: slightly flat affect, ? Mild depression   Assessment/Plan  Recurrent sinus infections Continued sinus pain and pressure Afebrile today in the office Treatment with antibiotics : 12/23-12/30>> Ceftriaxone and Azithro, pred taper 1/10>> Augmentin x 10 days 12/31 >> UTI Macrobid x 10 days States the oxygen she was discharged with is giving her sinus infection  Plan: We will walk you today to see if you still need your oxygen with ambulation.>> saturation low with ambulation was 97% today. We will call [facility] and have them discontinue the oxygen. Ayr gel spray per each nare daily as needed . CXR today CBC today BMET today. We will call with labs and CXR result Continue Singulair 10 mg daily Continue prednisone 5 mg daily Flonase 2 sprays each nare  once daily Follow up with Dr. Halford Chessman or Judson Roch in 2 weeks Referral to Dr. Radene Journey ENT>> chronic sinusitis Consider getting a new PCP if you are not happy with the doctor you currently have. Please contact office for sooner follow up if symptoms do not improve or worsen or seek emergency care   Would prefer to avoid antibiotics as she has frequent treatments for the last month. Just completed 10 days of Augmentin 3 days ago.    Hypoxia Resolved Walked today with max desat of 97% on RA Plan DC oxygen  Multifocal pneumonia Hospitalized 12/23-12/30 with pneumonia Last CXR showed resolving pneumonia Plan: CXR today   Personality changes since hospitalization per daughter ? Depression Plan Consider referral to Neurology>> PCP Consider addition of anti depressant per PCP   Today's visit included 40 minutes of face to face time obtaining medical history, examining the patient , and counseling patient and her daughter regarding this patient's treatments for a very  complicated medical history.   Magdalen Spatz, NP 07/17/2018  5:25 PM

## 2018-07-17 NOTE — Assessment & Plan Note (Signed)
Hospitalized 12/23-12/30 with pneumonia Last CXR showed resolving pneumonia Plan: CXR today

## 2018-07-17 NOTE — Progress Notes (Signed)
Reviewed and agree with assessment/plan.   Kayleen Alig, MD [facility] 06/21/2016, 12:24 PM Pager:  [phone]  

## 2018-07-18 ENCOUNTER — Telehealth: Payer: Self-pay | Admitting: Acute Care

## 2018-07-18 LAB — CBC WITH DIFFERENTIAL/PLATELET
Basophils Absolute: 0.2 10*3/uL — ABNORMAL HIGH (ref 0.0–0.1)
Basophils Relative: 1.5 % (ref 0.0–3.0)
Eosinophils Absolute: 0.3 10*3/uL (ref 0.0–0.7)
Eosinophils Relative: 2.3 % (ref 0.0–5.0)
HCT: 38.5 % (ref 36.0–46.0)
Hemoglobin: 12.4 g/dL (ref 12.0–15.0)
Lymphocytes Relative: 19 % (ref 12.0–46.0)
Lymphs Abs: 2.3 10*3/uL (ref 0.7–4.0)
MCHC: 32.1 g/dL (ref 30.0–36.0)
MCV: 81.5 fl (ref 78.0–100.0)
Monocytes Absolute: 0.6 10*3/uL (ref 0.1–1.0)
Monocytes Relative: 4.9 % (ref 3.0–12.0)
Neutro Abs: 8.8 10*3/uL — ABNORMAL HIGH (ref 1.4–7.7)
Neutrophils Relative %: 72.3 % (ref 43.0–77.0)
Platelets: 201 10*3/uL (ref 150.0–400.0)
RBC: 4.73 Mil/uL (ref 3.87–5.11)
RDW: 14.8 % (ref 11.5–15.5)
WBC: 12.2 10*3/uL — ABNORMAL HIGH (ref 4.0–10.5)

## 2018-07-18 LAB — BASIC METABOLIC PANEL
BUN: 25 mg/dL — ABNORMAL HIGH (ref 6–23)
CO2: 31 mEq/L (ref 19–32)
Calcium: 8.8 mg/dL (ref 8.4–10.5)
Chloride: 95 mEq/L — ABNORMAL LOW (ref 96–112)
Creatinine, Ser: 1.04 mg/dL (ref 0.40–1.20)
GFR: 50.3 mL/min — ABNORMAL LOW (ref >60.00)
Glucose, Bld: 251 mg/dL — ABNORMAL HIGH (ref 70–99)
Potassium: 4.8 mEq/L (ref 3.5–5.1)
Sodium: 130 mEq/L — ABNORMAL LOW (ref 135–145)

## 2018-07-18 NOTE — Progress Notes (Signed)
[name] was made aware of these results for [name]

## 2018-07-18 NOTE — Telephone Encounter (Signed)
Called and spoke with Traci about the CXR gave the results of this. But she would like the lab work as well. Clarise Cruz can you advise on the lab work Thanks you.

## 2018-07-19 DIAGNOSIS — Z7951 Long term (current) use of inhaled steroids: Secondary | ICD-10-CM | POA: Diagnosis not present

## 2018-07-19 DIAGNOSIS — Z8701 Personal history of pneumonia (recurrent): Secondary | ICD-10-CM | POA: Diagnosis not present

## 2018-07-19 DIAGNOSIS — Z9981 Dependence on supplemental oxygen: Secondary | ICD-10-CM | POA: Diagnosis not present

## 2018-07-19 DIAGNOSIS — J449 Chronic obstructive pulmonary disease, unspecified: Secondary | ICD-10-CM | POA: Diagnosis not present

## 2018-07-19 DIAGNOSIS — E119 Type 2 diabetes mellitus without complications: Secondary | ICD-10-CM | POA: Diagnosis not present

## 2018-07-19 DIAGNOSIS — Z794 Long term (current) use of insulin: Secondary | ICD-10-CM | POA: Diagnosis not present

## 2018-07-19 NOTE — Telephone Encounter (Signed)
I reviewed the results.  Still has a mildly elevated white count which is likely related to her recent prednisone use.  Chest x-ray confirms improved aeration of previous pneumonia showing the pneumonia is likely resolving.  Given patient's other comorbidities and age it is not shocking that she may still be fatigued regarding the pneumonia and sinusitis that she is recovering from.  As well as the antibiotic use that she is recovering from.  No additional Recs at this time  [name], FNP

## 2018-07-19 NOTE — Telephone Encounter (Signed)
Aaron Edelman is the doc of the day this morning. Please review labs per SG response.

## 2018-07-19 NOTE — Telephone Encounter (Signed)
Almyra Free with [facility] returned call, CB is [phone].

## 2018-07-19 NOTE — Telephone Encounter (Signed)
LMTCB x1 for Traci with [facility].

## 2018-07-19 NOTE — Telephone Encounter (Signed)
Spoke with Almyra Free at [employer]. She is aware of Brian's response. Nothing further was needed at this time.

## 2018-07-19 NOTE — Telephone Encounter (Signed)
Please route to doc of the day. This should have gone to doc of the day yesterday as I was off yesterday afternoon, and I am in the hospital today. Thanks so much

## 2018-07-24 ENCOUNTER — Telehealth: Payer: Self-pay | Admitting: Pulmonary Disease

## 2018-07-24 DIAGNOSIS — J449 Chronic obstructive pulmonary disease, unspecified: Secondary | ICD-10-CM | POA: Diagnosis not present

## 2018-07-24 DIAGNOSIS — Z7951 Long term (current) use of inhaled steroids: Secondary | ICD-10-CM | POA: Diagnosis not present

## 2018-07-24 DIAGNOSIS — Z794 Long term (current) use of insulin: Secondary | ICD-10-CM | POA: Diagnosis not present

## 2018-07-24 DIAGNOSIS — Z8701 Personal history of pneumonia (recurrent): Secondary | ICD-10-CM | POA: Diagnosis not present

## 2018-07-24 DIAGNOSIS — E119 Type 2 diabetes mellitus without complications: Secondary | ICD-10-CM | POA: Diagnosis not present

## 2018-07-24 DIAGNOSIS — Z9981 Dependence on supplemental oxygen: Secondary | ICD-10-CM | POA: Diagnosis not present

## 2018-07-24 NOTE — Telephone Encounter (Signed)
Called and spoke with Clayton.  Patient was prescribed Augmentin 875mg , #20, 07/05/18, by [name], NP. Understanding stated.  Wannetta Sender stated that she would let Almyra Free know. Nothing further at this time.

## 2018-07-26 DIAGNOSIS — J449 Chronic obstructive pulmonary disease, unspecified: Secondary | ICD-10-CM | POA: Diagnosis not present

## 2018-07-26 DIAGNOSIS — Z7951 Long term (current) use of inhaled steroids: Secondary | ICD-10-CM | POA: Diagnosis not present

## 2018-07-26 DIAGNOSIS — Z8701 Personal history of pneumonia (recurrent): Secondary | ICD-10-CM | POA: Diagnosis not present

## 2018-07-26 DIAGNOSIS — Z9981 Dependence on supplemental oxygen: Secondary | ICD-10-CM | POA: Diagnosis not present

## 2018-07-26 DIAGNOSIS — E119 Type 2 diabetes mellitus without complications: Secondary | ICD-10-CM | POA: Diagnosis not present

## 2018-07-26 DIAGNOSIS — Z794 Long term (current) use of insulin: Secondary | ICD-10-CM | POA: Diagnosis not present

## 2018-07-30 ENCOUNTER — Other Ambulatory Visit: Payer: Self-pay

## 2018-07-30 ENCOUNTER — Inpatient Hospital Stay (HOSPITAL_COMMUNITY): Payer: Medicare Other

## 2018-07-30 ENCOUNTER — Inpatient Hospital Stay (HOSPITAL_COMMUNITY)
Admission: EM | Admit: 2018-07-30 | Discharge: 2018-08-03 | DRG: 871 | Disposition: A | Discharge status: Home Health | Payer: Medicare Other | Attending: Internal Medicine | Admitting: Internal Medicine

## 2018-07-30 ENCOUNTER — Emergency Department (HOSPITAL_COMMUNITY): Payer: Medicare Other

## 2018-07-30 DIAGNOSIS — H548 Legal blindness, as defined in USA: Secondary | ICD-10-CM | POA: Diagnosis present

## 2018-07-30 DIAGNOSIS — J189 Pneumonia, unspecified organism: Secondary | ICD-10-CM

## 2018-07-30 DIAGNOSIS — Z8673 Personal history of transient ischemic attack (TIA), and cerebral infarction without residual deficits: Secondary | ICD-10-CM

## 2018-07-30 DIAGNOSIS — Z794 Long term (current) use of insulin: Secondary | ICD-10-CM

## 2018-07-30 DIAGNOSIS — Z91048 Other nonmedicinal substance allergy status: Secondary | ICD-10-CM

## 2018-07-30 DIAGNOSIS — Z9049 Acquired absence of other specified parts of digestive tract: Secondary | ICD-10-CM

## 2018-07-30 DIAGNOSIS — E039 Hypothyroidism, unspecified: Secondary | ICD-10-CM | POA: Diagnosis present

## 2018-07-30 DIAGNOSIS — E119 Type 2 diabetes mellitus without complications: Secondary | ICD-10-CM | POA: Diagnosis present

## 2018-07-30 DIAGNOSIS — A419 Sepsis, unspecified organism: Principal | ICD-10-CM

## 2018-07-30 DIAGNOSIS — G4730 Sleep apnea, unspecified: Secondary | ICD-10-CM | POA: Diagnosis present

## 2018-07-30 DIAGNOSIS — M5090 Cervical disc disorder, unspecified, unspecified cervical region: Secondary | ICD-10-CM | POA: Diagnosis present

## 2018-07-30 DIAGNOSIS — H3530 Unspecified macular degeneration: Secondary | ICD-10-CM | POA: Diagnosis present

## 2018-07-30 DIAGNOSIS — K219 Gastro-esophageal reflux disease without esophagitis: Secondary | ICD-10-CM | POA: Diagnosis present

## 2018-07-30 DIAGNOSIS — I2510 Atherosclerotic heart disease of native coronary artery without angina pectoris: Secondary | ICD-10-CM | POA: Diagnosis present

## 2018-07-30 DIAGNOSIS — M1990 Unspecified osteoarthritis, unspecified site: Secondary | ICD-10-CM | POA: Diagnosis present

## 2018-07-30 DIAGNOSIS — Z881 Allergy status to other antibiotic agents status: Secondary | ICD-10-CM

## 2018-07-30 DIAGNOSIS — R42 Dizziness and giddiness: Secondary | ICD-10-CM

## 2018-07-30 DIAGNOSIS — J329 Chronic sinusitis, unspecified: Secondary | ICD-10-CM | POA: Diagnosis present

## 2018-07-30 DIAGNOSIS — I10 Essential (primary) hypertension: Secondary | ICD-10-CM | POA: Diagnosis not present

## 2018-07-30 DIAGNOSIS — Z9841 Cataract extraction status, right eye: Secondary | ICD-10-CM

## 2018-07-30 DIAGNOSIS — E538 Deficiency of other specified B group vitamins: Secondary | ICD-10-CM | POA: Diagnosis present

## 2018-07-30 DIAGNOSIS — Z7951 Long term (current) use of inhaled steroids: Secondary | ICD-10-CM

## 2018-07-30 DIAGNOSIS — I252 Old myocardial infarction: Secondary | ICD-10-CM | POA: Diagnosis not present

## 2018-07-30 DIAGNOSIS — Z87891 Personal history of nicotine dependence: Secondary | ICD-10-CM

## 2018-07-30 DIAGNOSIS — M3500 Sicca syndrome, unspecified: Secondary | ICD-10-CM | POA: Diagnosis present

## 2018-07-30 DIAGNOSIS — Z86010 Personal history of colonic polyps: Secondary | ICD-10-CM

## 2018-07-30 DIAGNOSIS — R531 Weakness: Secondary | ICD-10-CM | POA: Diagnosis not present

## 2018-07-30 DIAGNOSIS — R918 Other nonspecific abnormal finding of lung field: Secondary | ICD-10-CM | POA: Diagnosis not present

## 2018-07-30 DIAGNOSIS — D638 Anemia in other chronic diseases classified elsewhere: Secondary | ICD-10-CM | POA: Diagnosis not present

## 2018-07-30 DIAGNOSIS — Z8249 Family history of ischemic heart disease and other diseases of the circulatory system: Secondary | ICD-10-CM

## 2018-07-30 DIAGNOSIS — E785 Hyperlipidemia, unspecified: Secondary | ICD-10-CM | POA: Diagnosis present

## 2018-07-30 DIAGNOSIS — Z882 Allergy status to sulfonamides status: Secondary | ICD-10-CM

## 2018-07-30 DIAGNOSIS — I272 Pulmonary hypertension, unspecified: Secondary | ICD-10-CM | POA: Diagnosis present

## 2018-07-30 DIAGNOSIS — Z955 Presence of coronary angioplasty implant and graft: Secondary | ICD-10-CM

## 2018-07-30 DIAGNOSIS — R509 Fever, unspecified: Secondary | ICD-10-CM | POA: Diagnosis not present

## 2018-07-30 DIAGNOSIS — I7300 Raynaud's syndrome without gangrene: Secondary | ICD-10-CM | POA: Diagnosis present

## 2018-07-30 DIAGNOSIS — D696 Thrombocytopenia, unspecified: Secondary | ICD-10-CM | POA: Diagnosis not present

## 2018-07-30 DIAGNOSIS — Z8541 Personal history of malignant neoplasm of cervix uteri: Secondary | ICD-10-CM

## 2018-07-30 DIAGNOSIS — Z981 Arthrodesis status: Secondary | ICD-10-CM

## 2018-07-30 DIAGNOSIS — I241 Dressler's syndrome: Secondary | ICD-10-CM | POA: Diagnosis present

## 2018-07-30 DIAGNOSIS — Z833 Family history of diabetes mellitus: Secondary | ICD-10-CM

## 2018-07-30 DIAGNOSIS — Z7952 Long term (current) use of systemic steroids: Secondary | ICD-10-CM

## 2018-07-30 DIAGNOSIS — R339 Retention of urine, unspecified: Secondary | ICD-10-CM | POA: Diagnosis not present

## 2018-07-30 DIAGNOSIS — J690 Pneumonitis due to inhalation of food and vomit: Secondary | ICD-10-CM | POA: Diagnosis present

## 2018-07-30 DIAGNOSIS — Z9842 Cataract extraction status, left eye: Secondary | ICD-10-CM

## 2018-07-30 DIAGNOSIS — R0902 Hypoxemia: Secondary | ICD-10-CM | POA: Diagnosis not present

## 2018-07-30 DIAGNOSIS — D6959 Other secondary thrombocytopenia: Secondary | ICD-10-CM | POA: Diagnosis present

## 2018-07-30 DIAGNOSIS — J439 Emphysema, unspecified: Secondary | ICD-10-CM | POA: Diagnosis present

## 2018-07-30 DIAGNOSIS — Z79890 Hormone replacement therapy (postmenopausal): Secondary | ICD-10-CM

## 2018-07-30 DIAGNOSIS — D72819 Decreased white blood cell count, unspecified: Secondary | ICD-10-CM | POA: Diagnosis not present

## 2018-07-30 DIAGNOSIS — I959 Hypotension, unspecified: Secondary | ICD-10-CM | POA: Diagnosis not present

## 2018-07-30 DIAGNOSIS — D649 Anemia, unspecified: Secondary | ICD-10-CM

## 2018-07-30 DIAGNOSIS — Z801 Family history of malignant neoplasm of trachea, bronchus and lung: Secondary | ICD-10-CM

## 2018-07-30 DIAGNOSIS — Z888 Allergy status to other drugs, medicaments and biological substances status: Secondary | ICD-10-CM

## 2018-07-30 DIAGNOSIS — Z91010 Allergy to peanuts: Secondary | ICD-10-CM

## 2018-07-30 DIAGNOSIS — K225 Diverticulum of esophagus, acquired: Secondary | ICD-10-CM | POA: Diagnosis present

## 2018-07-30 DIAGNOSIS — Z7982 Long term (current) use of aspirin: Secondary | ICD-10-CM

## 2018-07-30 DIAGNOSIS — Z90710 Acquired absence of both cervix and uterus: Secondary | ICD-10-CM | POA: Diagnosis not present

## 2018-07-30 DIAGNOSIS — M797 Fibromyalgia: Secondary | ICD-10-CM | POA: Diagnosis present

## 2018-07-30 DIAGNOSIS — M349 Systemic sclerosis, unspecified: Secondary | ICD-10-CM | POA: Diagnosis present

## 2018-07-30 DIAGNOSIS — S0990XA Unspecified injury of head, initial encounter: Secondary | ICD-10-CM | POA: Diagnosis not present

## 2018-07-30 DIAGNOSIS — Z823 Family history of stroke: Secondary | ICD-10-CM

## 2018-07-30 LAB — URINALYSIS, ROUTINE W REFLEX MICROSCOPIC
Bilirubin Urine: NEGATIVE
Glucose, UA: NEGATIVE mg/dL
Hgb urine dipstick: NEGATIVE
Ketones, ur: NEGATIVE mg/dL
Leukocytes, UA: NEGATIVE
Nitrite: NEGATIVE
Protein, ur: NEGATIVE mg/dL
Specific Gravity, Urine: 1.01 (ref 1.005–1.030)
pH: 5 (ref 5.0–8.0)

## 2018-07-30 LAB — COMPREHENSIVE METABOLIC PANEL
ALT: 15 U/L (ref 0–44)
AST: 20 U/L (ref 15–41)
Albumin: 3.1 g/dL — ABNORMAL LOW (ref 3.5–5.0)
Alkaline Phosphatase: 47 U/L (ref 38–126)
Anion gap: 12 (ref 5–15)
BUN: 27 mg/dL — ABNORMAL HIGH (ref 8–23)
CO2: 23 mmol/L (ref 22–32)
Calcium: 8.3 mg/dL — ABNORMAL LOW (ref 8.9–10.3)
Chloride: 100 mmol/L (ref 98–111)
Creatinine, Ser: 1.02 mg/dL — ABNORMAL HIGH (ref 0.44–1.00)
GFR calc Af Amer: 58 mL/min — ABNORMAL LOW (ref >60)
GFR calc non Af Amer: 50 mL/min — ABNORMAL LOW (ref >60)
Glucose, Bld: 185 mg/dL — ABNORMAL HIGH (ref 70–99)
Potassium: 3.6 mmol/L (ref 3.5–5.1)
Sodium: 135 mmol/L (ref 135–145)
Total Bilirubin: 0.7 mg/dL (ref 0.3–1.2)
Total Protein: 5.9 g/dL — ABNORMAL LOW (ref 6.5–8.1)

## 2018-07-30 LAB — CBC
HCT: 36.9 % (ref 36.0–46.0)
Hemoglobin: 11 g/dL — ABNORMAL LOW (ref 12.0–15.0)
MCH: 26 pg (ref 26.0–34.0)
MCHC: 29.8 g/dL — ABNORMAL LOW (ref 30.0–36.0)
MCV: 87.2 fL (ref 80.0–100.0)
Platelets: <5 10*3/uL — CL (ref 150–400)
RBC: 4.23 MIL/uL (ref 3.87–5.11)
RDW: 14.6 % (ref 11.5–15.5)
WBC: 3.9 10*3/uL — ABNORMAL LOW (ref 4.0–10.5)
nRBC: 0 % (ref 0.0–0.2)

## 2018-07-30 LAB — HEMOGLOBIN A1C
Hgb A1c MFr Bld: 8 % — ABNORMAL HIGH (ref 4.8–5.6)
Mean Plasma Glucose: 182.9 mg/dL

## 2018-07-30 LAB — DIC (DISSEMINATED INTRAVASCULAR COAGULATION) PANEL (NOT AT ARMC): Smear Review: NONE SEEN

## 2018-07-30 LAB — PROCALCITONIN: Procalcitonin: 6.43 ng/mL

## 2018-07-30 LAB — FOLATE: Folate: 5.6 ng/mL — ABNORMAL LOW (ref >5.9)

## 2018-07-30 LAB — IRON AND TIBC
Iron: 14 ug/dL — ABNORMAL LOW (ref 28–170)
Saturation Ratios: 5 % — ABNORMAL LOW (ref 10.4–31.8)
TIBC: 304 ug/dL (ref 250–450)
UIBC: 290 ug/dL

## 2018-07-30 LAB — RETICULOCYTES
Immature Retic Fract: 15.1 % (ref 2.3–15.9)
RBC.: 4.5 MIL/uL (ref 3.87–5.11)
Retic Count, Absolute: 88.7 10*3/uL (ref 19.0–186.0)
Retic Ct Pct: 2 % (ref 0.4–3.1)

## 2018-07-30 LAB — INFLUENZA PANEL BY PCR (TYPE A & B)
Influenza A By PCR: NEGATIVE
Influenza B By PCR: NEGATIVE

## 2018-07-30 LAB — LACTIC ACID, PLASMA
Lactic Acid, Venous: 3.8 mmol/L (ref 0.5–1.9)
Lactic Acid, Venous: 4.1 mmol/L (ref 0.5–1.9)
Lactic Acid, Venous: 3 mmol/L (ref 0.5–1.9)

## 2018-07-30 LAB — GLUCOSE, CAPILLARY
Glucose-Capillary: 152 mg/dL — ABNORMAL HIGH (ref 70–99)
Glucose-Capillary: 249 mg/dL — ABNORMAL HIGH (ref 70–99)

## 2018-07-30 LAB — DIC (DISSEMINATED INTRAVASCULAR COAGULATION)PANEL
D-Dimer, Quant: 0.96 ug/mL-FEU — ABNORMAL HIGH (ref 0.00–0.50)
Fibrinogen: 499 mg/dL — ABNORMAL HIGH (ref 210–475)
INR: 1.09
Platelets: 175 10*3/uL (ref 150–400)
Prothrombin Time: 14 seconds (ref 11.4–15.2)
aPTT: 28 seconds (ref 24–36)

## 2018-07-30 LAB — I-STAT TROPONIN, ED: Troponin i, poc: 0.02 ng/mL (ref 0.00–0.08)

## 2018-07-30 LAB — CBG MONITORING, ED: Glucose-Capillary: 139 mg/dL — ABNORMAL HIGH (ref 70–99)

## 2018-07-30 LAB — FERRITIN: Ferritin: 57 ng/mL (ref 11–307)

## 2018-07-30 LAB — VITAMIN B12: Vitamin B-12: 454 pg/mL (ref 180–914)

## 2018-07-30 LAB — TSH: TSH: 1.016 u[IU]/mL (ref 0.350–4.500)

## 2018-07-30 MED ORDER — PREDNISONE 20 MG PO TABS: 20.0000 mg | ORAL_TABLET | Freq: Every day | ORAL | Status: DC

## 2018-07-30 MED ORDER — TRAZODONE HCL 50 MG PO TABS
25.0000 mg | ORAL_TABLET | Freq: Once | ORAL | Status: AC | PRN
  Administered 2018-07-30: 25 mg via ORAL
  Filled 2018-07-30: qty 1

## 2018-07-30 MED ORDER — SODIUM CHLORIDE 0.9 % IV BOLUS
1000.0000 mL | Freq: Once | INTRAVENOUS | Status: AC
  Administered 2018-07-30: 1000 mL via INTRAVENOUS

## 2018-07-30 MED ORDER — LACTATED RINGERS IV BOLUS
1000.0000 mL | Freq: Once | INTRAVENOUS | Status: AC
  Administered 2018-07-30: 1000 mL via INTRAVENOUS

## 2018-07-30 MED ORDER — LACTATED RINGERS IV SOLN
INTRAVENOUS | Status: DC
  Administered 2018-07-30 – 2018-07-31 (×3): via INTRAVENOUS

## 2018-07-30 MED ORDER — INSULIN ASPART 100 UNIT/ML ~~LOC~~ SOLN: 0.0000 [IU] | Freq: Three times a day (TID) | SUBCUTANEOUS | Status: DC

## 2018-07-30 MED ORDER — FLUTICASONE PROPIONATE 50 MCG/ACT NA SUSP
1.0000 | Freq: Every day | NASAL | Status: DC
  Administered 2018-07-31 – 2018-08-02 (×3): 1 via NASAL
  Filled 2018-07-30: qty 16

## 2018-07-30 MED ORDER — FUROSEMIDE 10 MG/ML IJ SOLN: 10.0000 mg | INTRAMUSCULAR | Status: DC

## 2018-07-30 MED ORDER — LACTATED RINGERS IV BOLUS
500.0000 mL | Freq: Once | INTRAVENOUS | Status: DC
  Administered 2018-07-30: 500 mL via INTRAVENOUS

## 2018-07-30 MED ORDER — HYDROCORTISONE NA SUCCINATE PF 100 MG IJ SOLR
100.0000 mg | Freq: Three times a day (TID) | INTRAMUSCULAR | Status: DC
  Administered 2018-07-30 – 2018-07-31 (×4): 100 mg via INTRAVENOUS
  Filled 2018-07-30 (×5): qty 2

## 2018-07-30 MED ORDER — SODIUM CHLORIDE 0.9% IV SOLUTION
Freq: Once | INTRAVENOUS | Status: AC
  Administered 2018-07-30: 18:00:00 via INTRAVENOUS

## 2018-07-30 MED ORDER — DIPHENHYDRAMINE HCL 50 MG/ML IJ SOLN: 25.0000 mg | Freq: Four times a day (QID) | INTRAMUSCULAR | Status: DC | PRN

## 2018-07-30 MED ORDER — ACETAMINOPHEN 650 MG RE SUPP: 650.0000 mg | Freq: Four times a day (QID) | RECTAL | Status: DC | PRN

## 2018-07-30 MED ORDER — SODIUM CHLORIDE 0.9 % IV SOLN
2.0000 g | Freq: Once | INTRAVENOUS | Status: AC
  Administered 2018-07-30: 2 g via INTRAVENOUS
  Filled 2018-07-30: qty 2

## 2018-07-30 MED ORDER — DOCUSATE SODIUM 50 MG PO CAPS
50.0000 mg | ORAL_CAPSULE | Freq: Every evening | ORAL | Status: DC | PRN
  Administered 2018-07-31: 50 mg via ORAL
  Filled 2018-07-30 (×2): qty 1

## 2018-07-30 MED ORDER — ACETAMINOPHEN 325 MG PO TABS
650.0000 mg | ORAL_TABLET | Freq: Four times a day (QID) | ORAL | Status: DC | PRN
  Administered 2018-08-01 – 2018-08-02 (×2): 650 mg via ORAL
  Filled 2018-07-30 (×2): qty 2

## 2018-07-30 MED ORDER — SODIUM CHLORIDE 0.9 % IV SOLN
3.0000 g | Freq: Three times a day (TID) | INTRAVENOUS | Status: DC
  Administered 2018-07-30 – 2018-08-03 (×10): 3 g via INTRAVENOUS
  Filled 2018-07-30 (×13): qty 3

## 2018-07-30 MED ORDER — LEVOTHYROXINE SODIUM 100 MCG PO TABS
100.0000 ug | ORAL_TABLET | Freq: Every day | ORAL | Status: DC
  Administered 2018-07-31 – 2018-08-03 (×4): 100 ug via ORAL
  Filled 2018-07-30 (×4): qty 1

## 2018-07-30 MED ORDER — PANTOPRAZOLE SODIUM 40 MG PO TBEC
40.0000 mg | DELAYED_RELEASE_TABLET | Freq: Every day | ORAL | Status: DC
  Administered 2018-07-30 – 2018-08-03 (×5): 40 mg via ORAL
  Filled 2018-07-30 (×5): qty 1

## 2018-07-30 MED ORDER — INSULIN ASPART 100 UNIT/ML ~~LOC~~ SOLN: 0.0000 [IU] | Freq: Every day | SUBCUTANEOUS | Status: DC

## 2018-07-30 NOTE — ED Notes (Signed)
MD updated with critical LA. New orders to give 558ml bolus LR after 4 units platelets

## 2018-07-30 NOTE — Progress Notes (Signed)
[place] NOTE  Patient Care Team: Merrilee Seashore, MD as PCP - General (Internal Medicine) Fay Records, MD as PCP - Cardiology (Cardiology) Sable Feil, MD as Consulting Physician (Gastroenterology) Chesley Mires, MD as Consulting Physician (Pulmonary Disease) Fay Records, MD as Consulting Physician (Cardiology) Josetta Huddle, MD as Consulting Physician (Internal Medicine)  ASSESSMENT & PLAN Severe thrombocytopenia, likely spurious result On review of her peripheral blood smear, I can see abundance of platelet.  At the time of dictation, repeat CBC revealed normal platelet count.  She did receive a unit of platelet transfusion. No further work-up is necessary although I did note that she had mild folate deficiency.  I recommend folate replacement therapy  Sepsis/pneumonia I will defer to primary service for evaluation.  With broad-spectrum antibiotic therapy, it would likely she might become thrombocytopenic in the near future.  Mild leukopenia and anemia Likely related to infection. As above, I recommend treatment of underlying disease for now.  Discharge planning Will defer to primary service.  With normal platelet count, I will sign off.  Please call if questions arise.  All questions were answered. The patient knows to call the clinic with any problems, questions or concerns. No barriers to learning was detected. I spent 55 minutes counseling the patient face to face. The total time spent in the appointment was 60 minutes and more than 50% was on counseling and review of test results  Heath Lark, MD 07/30/2018 4:44 PM   CHIEF COMPLAINTS/PURPOSE OF CONSULTATION:  Severe pancytopenia  HISTORY OF PRESENTING ILLNESS:  [name] 85 y.o. female is seen because of severe thrombocytopenia. The patient has been feeling unwell with profound weakness and fever prior to going to the emergency department. Her CBC from today show a white count  of 3.9, hemoglobin of 11 and platelet count of less than 5000. Repeat blood test that was not available at the time of evaluation came back within normal limits at 175,000. Of note, her baseline blood work from July 17, 2018 showed a white count of 12.2, hemoglobin of 12.4 and platelet count of 210. She denies recent bruising/bleeding, such as spontaneous epistaxis, hematuria, melena or hematochezia  The patient denies history of liver disease, exposure to heparin, history of cardiac murmur/prior cardiovascular surgery or recent new medications She denies prior blood or platelet transfusions.  At the time of evaluation, the patient has received a unit of platelet transfusion.  She had history of recurrent infection and was treated in December with combination chemotherapy plus prednisone, subsequently with Macrobid and Augmentin.  When she was seen on July 17, 2018, she was in the process of being referred to see ENT for chronic sinusitis.  She takes prednisone therapy chronically for autoimmune disease.  Repeat chest x-ray today confirmed possible pneumonia and she has clinical signs of sepsis.  She is receiving broad-spectrum IV antibiotics, oxygen therapy and aggressive fluid resuscitation.    MEDICAL HISTORY:  Past Medical History:  Diagnosis Date  . Adenomatous colon polyp   . Arthritis   . Blood transfusion   . Cancer [facility])    cervical cancer pre hysterectomy  . Cervical disc disease   . Chronic sinusitis   . CONSTIPATION, CHRONIC 06/26/2007   Qualifier: Diagnosis of  By: Nils Pyle CMA ([place]), Mearl Latin    . COPD (chronic obstructive pulmonary disease) ([place])   . Coronary artery disease    prior MI with stenting of RCA with repeat PTCA/stenting for ISR in 01/2008, stent to  LM 11/2008  . CVA (cerebral infarction) 1991  . Depression   . Diabetes mellitus   . Diverticulosis   . 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 Dyslipidemia   . Emphysema   . Fibromyalgia   . GERD (gastroesophageal  reflux disease)   . Hypertension   . Hypothyroidism   . IBS (irritable bowel syndrome)   . Legally blind   . Macular degeneration   . Myocardial infarction [facility]) may 27th 2007  . Pneumonia 07/16/2013    HX PNEUMONIA  . Pulmonary hypertension ([place])   . Raynaud's phenomenon   . Scleroderma ([place])   . Sjogren's disease ([place])   . Sleep apnea    STOP BANG SCORE 5  . Stroke [facility]) 1990   brain stem stroke - weakness rt hand  . Zenker's diverticulum     SURGICAL HISTORY: Past Surgical History:  Procedure Laterality Date  . APPENDECTOMY    . BALLOON DILATION N/A 07/15/2015   Procedure: BALLOON DILATION;  Surgeon: Milus Banister, MD;  Location: [facility];  Service: Endoscopy;  Laterality: N/A;  . BILATERAL OOPHORECTOMY  2002  . BRONCHOSCOPY  02-22-09  . CARDIAC CATHETERIZATION     x 3  . CATARACT EXTRACTION    . CHOLECYSTECTOMY  1965  . COLON SURGERY  2014   colon resection  . colonectomy    . ESOPHAGOGASTRODUODENOSCOPY (EGD) WITH ESOPHAGEAL DILATION N/A 06/18/2013   Procedure: ESOPHAGOGASTRODUODENOSCOPY (EGD) WITH ESOPHAGEAL DILATION;  Surgeon: Inda Castle, MD;  Location: [facility];  Service: Endoscopy;  Laterality: N/A;  . FLEXIBLE SIGMOIDOSCOPY N/A 07/15/2015   Procedure: FLEXIBLE SIGMOIDOSCOPY;  Surgeon: Milus Banister, MD;  Location: [facility];  Service: Endoscopy;  Laterality: N/A;  . lobe thyroid removal    . PROCTOSCOPY  03/18/2012   Procedure: PROCTOSCOPY;  Surgeon: Adin Hector, MD;  Location: [facility];  Service: General;  Laterality: N/A;  rigid procto  . RADIOLOGY WITH ANESTHESIA N/A 03/09/2015   Procedure: MRI CERVICAL SPINE WITHOUT AND LUMBER WITHOUT;  Surgeon: Medication Radiologist, MD;  Location: [facility];  Service: Radiology;  Laterality: N/A;  . [place]   and implantation of a plate   . STENTED CARDIAC  ARTERY  2007 / 2007 / 2010   X 3 STENT  . THYROID LOBECTOMY  1991   removal of left lobe thyroid  . TOTAL ABDOMINAL HYSTERECTOMY  1973     SOCIAL HISTORY: Social History   Socioeconomic History  . Marital status: Divorced    Spouse name: Not on file  . Number of children: Not on file  . Years of education: Not on file  . Highest education level: Not on file  Occupational History  . Occupation: retired    Fish farm manager: RETIRED  Social Needs  . Financial resource strain: Not on file  . Food insecurity:    Worry: Not on file    Inability: Not on file  . Transportation needs:    Medical: Not on file    Non-medical: Not on file  Tobacco Use  . Smoking status: Former Smoker    Packs/day: 1.00    Years: 40.00    Pack years: 40.00    Types: Cigarettes    Last attempt to quit: 06/26/1988    Years since quitting: 30.1  . Smokeless tobacco: Never Used  . Tobacco comment: 40 pack years  Substance and Sexual Activity  . Alcohol use: No  . Drug use: No  . Sexual activity: Not on file  Lifestyle  .  Physical activity:    Days per week: Not on file    Minutes per session: Not on file  . Stress: Not on file  Relationships  . Social connections:    Talks on phone: Patient refused    Gets together: Patient refused    Attends religious service: Patient refused    Active member of club or organization: Patient refused    Attends meetings of clubs or organizations: Patient refused    Relationship status: Patient refused  . Intimate partner violence:    Fear of current or ex partner: Patient refused    Emotionally abused: Patient refused    Physically abused: Patient refused    Forced sexual activity: Patient refused  Other Topics Concern  . Not on file  Social History Narrative  . Not on file    FAMILY HISTORY: Family History  Problem Relation Age of Onset  . Other Mother 15       Died from sepsis  . Stroke Father 74       died  . Heart disease Father   . Diabetes Father   . Cancer Sister        lung  . Diabetes Sister   . Cancer Brother        lung  . Diabetes Brother   . Diabetes Brother      ALLERGIES:  is allergic to peanut oil; sulfa antibiotics; titanium; yellow dye; lantus [insulin glargine]; levaquin [levofloxacin in d5w]; metoprolol-hydrochlorothiazide; norvasc [amlodipine]; novolog [insulin aspart]; penciclovir; senna [sennosides]; statins; sulfonamide derivatives; adhesive [tape]; and maxidex [dexamethasone].  MEDICATIONS:  Current Facility-Administered Medications  Medication Dose Route Frequency Provider Last Rate Last Dose  . 0.9 %  sodium chloride infusion (Manually program via Guardrails IV Fluids)   Intravenous Once Thurnell Lose, MD      . Ampicillin-Sulbactam (UNASYN) 3 g in sodium chloride 0.9 % 100 mL IVPB  3 g Intravenous Q8H Rumbarger, Valeda Malm, RPH      . diphenhydrAMINE (BENADRYL) injection 25 mg  25 mg Intravenous Q6H PRN Thurnell Lose, MD      . fluticasone (FLONASE) 50 MCG/ACT nasal spray 1 spray  1 spray Each Nare Daily Thurnell Lose, MD      . hydrocortisone sodium succinate (SOLU-CORTEF) 100 MG injection 100 mg  100 mg Intravenous Q8H Singh, Prashant K, MD      . insulin aspart (novoLOG) injection 0-5 Units  0-5 Units Subcutaneous QHS Lala Lund K, MD      . insulin aspart (novoLOG) injection 0-9 Units  0-9 Units Subcutaneous TID WC Singh, Prashant K, MD      . lactated ringers bolus 1,000 mL  1,000 mL Intravenous Once Thurnell Lose, MD 983.6 mL/hr at 07/30/18 1617 1,000 mL at 07/30/18 1617  . lactated ringers infusion   Intravenous Continuous Thurnell Lose, MD      . Derrill Memo ON 07/31/2018] levothyroxine (SYNTHROID, LEVOTHROID) tablet 100 mcg  100 mcg Oral QAC breakfast Thurnell Lose, MD      . pantoprazole (PROTONIX) EC tablet 40 mg  40 mg Oral Daily Thurnell Lose, MD       Current Outpatient Medications  Medication Sig Dispense Refill  . aspirin EC 81 MG tablet Take 1 tablet (81 mg total) by mouth daily. 90 tablet 3  . cetirizine (ZYRTEC) 10 MG tablet Take 10 mg by mouth daily as needed for allergies.    [name]  esomeprazole (NEXIUM) 40 MG capsule Take 40 mg by mouth  daily.     . fluticasone (FLONASE) 50 MCG/ACT nasal spray Place 1 spray into both nostrils daily. 16 g 0  . HYDROcodone-acetaminophen (NORCO) 7.5-325 MG tablet Take 1 tablet by mouth every 6 (six) hours as needed for moderate pain.    [name] imipramine (TOFRANIL) 25 MG tablet Take 75 mg by mouth at bedtime.     . Insulin Glargine (LANTUS SOLOSTAR) 100 UNIT/ML Solostar Pen Inject 3 Units into the skin daily.    . insulin lispro (HUMALOG KWIKPEN) 100 UNIT/ML KiwkPen Inject 5-6 Units into the skin 3 (three) times daily with meals. Inject 6 units subcutaneously with breakfast and lunch, inject 5 units with supper    . levothyroxine (SYNTHROID, LEVOTHROID) 100 MCG tablet Take 100 mcg by mouth daily before breakfast.    . montelukast (SINGULAIR) 10 MG tablet Take 10 mg by mouth at bedtime.     . Naphazoline-Pheniramine (OPCON-A) 0.027-0.315 % SOLN Place 1-2 drops into both eyes as needed (for allergies).    . predniSONE (DELTASONE) 5 MG tablet TAKE 1 TABLET ONCE A DAY WITH BREAKFAST 30 tablet 5  . triazolam (HALCION) 0.25 MG tablet Take 0.25 mg by mouth at bedtime as needed for sleep. For sleep      REVIEW OF SYSTEMS:   Eyes: Denies blurriness of vision, double vision or watery eyes Ears, nose, mouth, throat, and face: Denies mucositis or sore throat Respiratory: Denies cough, dyspnea or wheezes Cardiovascular: Denies palpitation, chest discomfort or lower extremity swelling Gastrointestinal:  Denies nausea, heartburn or change in bowel habits Skin: Denies abnormal skin rashes Lymphatics: Denies new lymphadenopathy or easy bruising Behavioral/Psych: Mood is stable, no new changes  All other systems were reviewed with the patient and are negative.  PHYSICAL EXAMINATION: ECOG PERFORMANCE STATUS: 2 - Symptomatic, <50% confined to bed  Vitals:   07/30/18 1545 07/30/18 1615  BP: (!) 107/39 (!) 116/49  Pulse: 93 92  Resp: 14 (!) 27  Temp:     SpO2: 98% 98%   There were no vitals filed for this visit.  GENERAL:alert, no distress and comfortable SKIN: skin color, texture, turgor are normal, no rashes or significant lesions EYES: normal, conjunctiva are pink and non-injected, sclera clear OROPHARYNX:no exudate, no erythema and lips, buccal mucosa, and tongue normal  NECK: supple, thyroid normal size, non-tender, without nodularity LYMPH:  no palpable lymphadenopathy in the cervical, axillary or inguinal LUNGS: clear to auscultation and percussion with normal breathing effort HEART: regular rate & rhythm and no murmurs and no lower extremity edema ABDOMEN:abdomen soft, non-tender and normal bowel sounds Musculoskeletal:no cyanosis of digits and no clubbing  PSYCH: alert & oriented x 3 with fluent speech NEURO: no focal motor/sensory deficits  LABORATORY DATA:  I have reviewed the data as listed Lab Results  Component Value Date   WBC 3.9 (L) 07/30/2018   HGB 11.0 (L) 07/30/2018   HCT 36.9 07/30/2018   MCV 87.2 07/30/2018   PLT 175 07/30/2018   I have reviewed her peripheral blood smear myself.  She has mildly reduced platelet count but abundance of platelets are still seen.  There are no schistocytes.  Some platelet clumping is noted.  RADIOGRAPHIC STUDIES: I have personally reviewed the radiological images as listed and agreed with the findings in the report. Dg Chest 2 View  Result Date: 07/17/2018 CLINICAL DATA:  Evaluate for pneumonia. EXAM: CHEST - 2 VIEW COMPARISON:  07/05/2018. FINDINGS: Normal heart size. Lungs appear hyperinflated with diffuse coarsened interstitial markings bilaterally. There is been interval resolution  of previous pulmonary edema and pleural effusions. Significant improved aeration to both lungs noted compatible with resolving pneumonia. IMPRESSION: 1. Improved aeration of both lungs compatible with resolving pneumonia. 2. Resolution of previous pulmonary edema and pleural effusions.  Electronically Signed   By: Kerby Moors M.D.   On: 07/17/2018 17:21   Dg Chest 2 View  Result Date: 07/05/2018 CLINICAL DATA:  Pneumonia follow-up. EXAM: CHEST - 2 VIEW COMPARISON:  Chest x-ray dated June 22, 2018. FINDINGS: The heart size and mediastinal contours are within normal limits. Diffuse interstitial edema is similar to prior study. Improving patchy airspace disease in the right lung. Unchanged small bilateral pleural effusions. No pneumothorax. No acute osseous abnormality. IMPRESSION: 1. Improving multifocal pneumonia in the right lung. 2. Unchanged mild interstitial pulmonary edema and small bilateral pleural effusions. Electronically Signed   By: Titus Dubin M.D.   On: 07/05/2018 16:24   Ct Head Wo Contrast  Result Date: 07/30/2018 CLINICAL DATA:  Dizziness and fall today.  Initial encounter. EXAM: CT HEAD WITHOUT CONTRAST TECHNIQUE: Contiguous axial images were obtained from the base of the skull through the vertex without intravenous contrast. COMPARISON:  Head CT scan 05/26/2008.  Brain MRI 01/15/2009. FINDINGS: Brain: No evidence of acute infarction, hemorrhage, hydrocephalus, extra-axial collection or mass lesion/mass effect. Mild atrophy noted. Vascular: No hyperdense vessel or unexpected calcification. Skull: Intact.  No focal lesion. Sinuses/Orbits: Status post cataract surgery.  Otherwise negative. Other: None. IMPRESSION: No acute abnormality. Mild atrophy. Electronically Signed   By: Inge Rise M.D.   On: 07/30/2018 16:11   Dg Chest Port 1 View  Result Date: 07/30/2018 CLINICAL DATA:  Dizziness. EXAM: PORTABLE CHEST 1 VIEW COMPARISON:  07/17/2018 06/22/2018.  09/26/2016. FINDINGS: Mediastinum hilar structures normal. Chronic interstitial changes. Right upper lobe infiltrate consistent with pneumonia. No pleural effusion or pneumothorax IMPRESSION: Right upper lobe infiltrate consistent with pneumonia. Electronically Signed   By: Marcello Moores  Register   On: 07/30/2018  10:51

## 2018-07-30 NOTE — ED Provider Notes (Signed)
[facility] Emergency Department Provider Note MRN:  [id number]  Arrival date & time: 07/30/18     Chief Complaint   Fall and Dizziness   History of Present Illness   [name] is a 85 y.o. year-old female with a history of COPD, diabetes, CAD presenting to the ED with chief complaint of weakness.  Patient explains that she had a recent admission for sepsis and pneumonia.  Has been recovering well.  Became generally weak this morning, causing her to be very lightheaded, causing her to slump over to the ground.  Denies loss of consciousness, denies head trauma, denies traumatic injury.  With EMS her blood pressure was 60 systolic, was given IV fluids.  Patient denies headache or vision change, no chest pain or shortness of breath, no cough, no vomiting, no diarrhea, no abdominal pain.  No numbness or weakness to the arms or legs, just generalized weakness and malaise.  Patient explains this feels similar to when she was septic.  Review of Systems  A complete 10 system review of systems was obtained and all systems are negative except as noted in the HPI and PMH.   Patient's Health History    Past Medical History:  Diagnosis Date  . Adenomatous colon polyp   . Arthritis   . Blood transfusion   . Cancer [facility])    cervical cancer pre hysterectomy  . Cervical disc disease   . Chronic sinusitis   . CONSTIPATION, CHRONIC 06/26/2007   Qualifier: Diagnosis of  By: Nils Pyle CMA ([place]), Mearl Latin    . COPD (chronic obstructive pulmonary disease) ([place])   . Coronary artery disease    prior MI with stenting of RCA with repeat PTCA/stenting for ISR in 01/2008, stent to LM 11/2008  . CVA (cerebral infarction) 1991  . Depression   . Diabetes mellitus   . Diverticulosis   . 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[place])   . Dyslipidemia   . Emphysema   . Fibromyalgia   . GERD (gastroesophageal reflux disease)   . Hypertension   . Hypothyroidism   . IBS (irritable bowel syndrome)   . Legally  blind   . Macular degeneration   . Myocardial infarction [facility]) may 27th 2007  . Pneumonia 07/16/2013    HX PNEUMONIA  . Pulmonary hypertension ([place])   . Raynaud's phenomenon   . Scleroderma ([place])   . Sjogren's disease ([place])   . Sleep apnea    STOP BANG SCORE 5  . Stroke [facility]) 1990   brain stem stroke - weakness rt hand  . Zenker's diverticulum     Past Surgical History:  Procedure Laterality Date  . APPENDECTOMY    . BALLOON DILATION N/A 07/15/2015   Procedure: BALLOON DILATION;  Surgeon: Milus Banister, MD;  Location[facility];  Service: Endoscopy;  Laterality: N/A;  . BILATERAL OOPHORECTOMY  2002  . BRONCHOSCOPY  02-22-09  . CARDIAC CATHETERIZATION     x 3  . CATARACT EXTRACTION    . CHOLECYSTECTOMY  1965  . COLON SURGERY  2014   colon resection  . colonectomy    . ESOPHAGOGASTRODUODENOSCOPY (EGD) WITH ESOPHAGEAL DILATION N/A 06/18/2013   Procedure: ESOPHAGOGASTRODUODENOSCOPY (EGD) WITH ESOPHAGEAL DILATION;  Surgeon: Inda Castle, MD;  Location: [place];  Service: Endoscopy;  Laterality: N/A;  . FLEXIBLE SIGMOIDOSCOPY N/A 07/15/2015   Procedure: FLEXIBLE SIGMOIDOSCOPY;  Surgeon: Milus Banister, MD;  [facility];  Service: Endoscopy;  Laterality: N/A;  . lobe thyroid removal    . PROCTOSCOPY  03/18/2012   Procedure: PROCTOSCOPY;  Surgeon: Adin Hector, MD;  [facility];  Service: General;  Laterality: N/A;  rigid procto  . RADIOLOGY WITH ANESTHESIA N/A 03/09/2015   Procedure: MRI CERVICAL SPINE WITHOUT AND LUMBER WITHOUT;  Surgeon: Medication Radiologist, MD;  Location: [place];  Service: Radiology;  Laterality: N/A;  . [place]   and implantation of a plate   . STENTED CARDIAC  ARTERY  2007 / 2007 / 2010   X 3 STENT  . THYROID LOBECTOMY  1991   removal of left lobe thyroid  . TOTAL ABDOMINAL HYSTERECTOMY  1973    Family History  Problem Relation Age of Onset  . Other Mother 24       Died from sepsis  . Stroke Father 69        died  . Heart disease Father   . Diabetes Father   . Cancer Sister        lung  . Diabetes Sister   . Cancer Brother        lung  . Diabetes Brother   . Diabetes Brother     Social History   Socioeconomic History  . Marital status: Divorced    Spouse name: Not on file  . Number of children: Not on file  . Years of education: Not on file  . Highest education level: Not on file  Occupational History  . Occupation: retired    Fish farm manager: RETIRED  Social Needs  . Financial resource strain: Not on file  . Food insecurity:    Worry: Not on file    Inability: Not on file  . Transportation needs:    Medical: Not on file    Non-medical: Not on file  Tobacco Use  . Smoking status: Former Smoker    Packs/day: 1.00    Years: 40.00    Pack years: 40.00    Types: Cigarettes    Last attempt to quit: 06/26/1988    Years since quitting: 30.1  . Smokeless tobacco: Never Used  . Tobacco comment: 40 pack years  Substance and Sexual Activity  . Alcohol use: No  . Drug use: No  . Sexual activity: Not on file  Lifestyle  . Physical activity:    Days per week: Not on file    Minutes per session: Not on file  . Stress: Not on file  Relationships  . Social connections:    Talks on phone: Patient refused    Gets together: Patient refused    Attends religious service: Patient refused    Active member of club or organization: Patient refused    Attends meetings of clubs or organizations: Patient refused    Relationship status: Patient refused  . Intimate partner violence:    Fear of current or ex partner: Patient refused    Emotionally abused: Patient refused    Physically abused: Patient refused    Forced sexual activity: Patient refused  Other Topics Concern  . Not on file  Social History Narrative  . Not on file     Physical Exam  Vital Signs and Nursing Notes reviewed Vitals:   07/30/18 1145 07/30/18 1150  BP: (!) 112/38   Pulse: 83 89  Resp: (!) 22 (!) 21  SpO2: (!) 88%  95%    CONSTITUTIONAL: Well-appearing, NAD NEURO:  Alert and oriented x 3, no focal deficits EYES:  eyes equal and reactive ENT/NECK:  no LAD, no JVD CARDIO: Regular rate, well-perfused, normal S1 and  S2 PULM:  CTAB no wheezing or rhonchi GI/GU:  normal bowel sounds, non-distended, non-tender MSK/SPINE:  No gross deformities, no edema SKIN:  no rash, atraumatic PSYCH:  Appropriate speech and behavior  Diagnostic and Interventional Summary    EKG Interpretation  Date/Time:  Tuesday July 30 2018 10:29:08 EST Ventricular Rate:  94 PR Interval:    QRS Duration: 90 QT Interval:  341 QTC Calculation: 427 R Axis:   55 Text Interpretation:  ved Confirmed by Gerlene Fee [phone]) on 07/30/2018 11:01:46 AM      Labs Reviewed  CBC - Abnormal; Notable for the following components:      Result Value   WBC 3.9 (*)    Hemoglobin 11.0 (*)    MCHC 29.8 (*)    Platelets <5 (*)    All other components within normal limits  COMPREHENSIVE METABOLIC PANEL - Abnormal; Notable for the following components:   Glucose, Bld 185 (*)    BUN 27 (*)    Creatinine, Ser 1.02 (*)    Calcium 8.3 (*)    Total Protein 5.9 (*)    Albumin 3.1 (*)    GFR calc non Af Amer 50 (*)    GFR calc Af Amer 58 (*)    All other components within normal limits  LACTIC ACID, PLASMA - Abnormal; Notable for the following components:   Lactic Acid, Venous 3.8 (*)    All other components within normal limits  CBG MONITORING, ED - Abnormal; Notable for the following components:   Glucose-Capillary 139 (*)    All other components within normal limits  CULTURE, BLOOD (ROUTINE X 2)  CULTURE, BLOOD (ROUTINE X 2)  URINALYSIS, ROUTINE W REFLEX MICROSCOPIC  INFLUENZA PANEL BY PCR (TYPE A & B)  DIC (DISSEMINATED INTRAVASCULAR COAGULATION) PANEL  ADAMTS13 ACTIVITY  I-STAT TROPONIN, ED  TYPE AND SCREEN    DG Chest Port 1 View  Final Result    CT Head Wo Contrast    (Results Pending)    Medications  lactated ringers  infusion (has no administration in time range)  sodium chloride 0.9 % bolus 1,000 mL (0 mLs Intravenous Stopped 07/30/18 1334)  ceFEPIme (MAXIPIME) 2 g in sodium chloride 0.9 % 100 mL IVPB (0 g Intravenous Stopped 07/30/18 1334)     Procedures Critical Care Critical Care Documentation Critical care time provided by me (excluding procedures): 36 minutes  Condition necessitating critical care: Concern for sepsis, pneumonia, thrombocytopenia  Components of critical care management: reviewing of prior records, laboratory and imaging interpretation, frequent re-examination and reassessment of vital signs, administration of IV fluids, IV antibiotics, discussion with consulting services    ED Course and Medical Decision Making  I have reviewed the triage vital signs and the nursing notes.  Pertinent labs & imaging results that were available during my care of the patient were reviewed by me and considered in my medical decision making (see below for details).  Considering recurrence of sepsis versus metabolic disarray versus dehydration versus arrhythmia in this 85 year old female with presyncope, hypotension with EMS.  Blood pressure here 676 systolic, heart rate 90, well-appearing with no neurological deficits.  Will obtain blood cultures, labs, infectious work-up.  No indication for CT head at this time given her exam, presyncope favored to be related to her low blood pressure.  Work-up reveals elevated lactate, chest x-ray concerning for continued pneumonia, code sepsis initiated, given IV cefepime.  Patient CBC returns with platelets less than 5, given her lightheadedness, fall, will CT head to screen  for ICH.  Accepted for admission by hospitalist service, given the thrombocytopenia will consult hematology for recommendations.  Renee Kirks. Sedonia Small, MD [place] [email or web address]  Final Clinical Impressions(s) / ED Diagnoses     ICD-10-CM   1.  Sepsis, due to unspecified organism, unspecified whether acute organ dysfunction present ([place]) A41.9   2. Dizzy R42 DG Chest Port 1 View    DG Chest Port 1 View  3. Thrombocytopenia [facility]) D69.6     ED Discharge Orders    None         Maudie Flakes, MD 07/30/18 1343

## 2018-07-30 NOTE — H&P (Addendum)
TRH H&P   Patient Demographics:    [name], is a 85 y.o. female  MRN: [id number]   DOB - 10/24/1930  Admit Date - 07/30/2018  Outpatient Primary MD for the patient is Merrilee Seashore, MD    Patient coming from: Home  Chief Complaint  Patient presents with  . Fall  . Dizziness      HPI:    [name]  is a 85 y.o. female, history of Zenker's diverticulum, aspiration pneumonia in the past, hypertension, DM type II, dyslipidemia, myalgia, GERD, stroke in the past with mild weakness in the right hand, both thyroidism, Sjogren's syndrome, Scleroderma on low-dose chronic steroids, who was hospitalized in December for aspiration pneumonia and sepsis.  She was discharged home she did very well for the last month, she says about 2 3 days ago she probably had a gagging episode while eating food, she woke up this morning feeling feverish having chills, went to the bathroom where she had a near syncopal episode.    She then came to the ER where she was diagnosed with right-sided pneumonia with possible sepsis and severe thrombocytopenia and I was called to admit.  Patient currently denies any headache chest or abdominal pain, no cough, minimal shortness of breath, no dysuria, no blood in stool or urine, she has not noticed any bruises or skin rashes.  Denies any sick contacts.  Denies any recent travels.  No other subjective complaints.  Since here she has been feeling somewhat better after receiving some IV fluids and oxygen.    Review of systems:    A full 10 point Review of Systems was done, except as stated above, all other Review of Systems were negative.   With Past History of the following :    Past  Medical History:  Diagnosis Date  . Adenomatous colon polyp   . Arthritis   . Blood transfusion   . Cancer [facility])    cervical cancer pre hysterectomy  . Cervical disc disease   . Chronic sinusitis   . CONSTIPATION, CHRONIC 06/26/2007   Qualifier: Diagnosis of  By: Nils Pyle CMA ([place]), Mearl Latin    . COPD (chronic obstructive pulmonary disease) ([place])   . Coronary artery disease    prior MI with stenting of RCA with repeat PTCA/stenting for ISR in 01/2008, stent to LM 11/2008  .  CVA (cerebral infarction) 1991  . Depression   . Diabetes mellitus   . Diverticulosis   . 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 Dyslipidemia   . Emphysema   . Fibromyalgia   . GERD (gastroesophageal reflux disease)   . Hypertension   . Hypothyroidism   . IBS (irritable bowel syndrome)   . Legally blind   . Macular degeneration   . Myocardial infarction [facility]) may 27th 2007  . Pneumonia 07/16/2013    HX PNEUMONIA  . Pulmonary hypertension ([place])   . Raynaud's phenomenon   . Scleroderma ([place])   . Sjogren's disease ([place])   . Sleep apnea    STOP BANG SCORE 5  . Stroke [facility]) 1990   brain stem stroke - weakness rt hand  . Zenker's diverticulum       Past Surgical History:  Procedure Laterality Date  . APPENDECTOMY    . BALLOON DILATION N/A 07/15/2015   Procedure: BALLOON DILATION;  Surgeon: Milus Banister, MD;  [facility];  Service: Endoscopy;  Laterality: N/A;  . BILATERAL OOPHORECTOMY  2002  . BRONCHOSCOPY  02-22-09  . CARDIAC CATHETERIZATION     x 3  . CATARACT EXTRACTION    . CHOLECYSTECTOMY  1965  . COLON SURGERY  2014   colon resection  . colonectomy    . ESOPHAGOGASTRODUODENOSCOPY (EGD) WITH ESOPHAGEAL DILATION N/A 06/18/2013   Procedure: ESOPHAGOGASTRODUODENOSCOPY (EGD) WITH ESOPHAGEAL DILATION;  Surgeon: Inda Castle, MD;  Location: [place];  Service: Endoscopy;  Laterality: N/A;  . FLEXIBLE SIGMOIDOSCOPY N/A 07/15/2015   Procedure: FLEXIBLE SIGMOIDOSCOPY;  Surgeon: Milus Banister,  MD;  [facility];  Service: Endoscopy;  Laterality: N/A;  . lobe thyroid removal    . PROCTOSCOPY  03/18/2012   Procedure: PROCTOSCOPY;  Surgeon: Adin Hector, MD;  [facility];  Service: General;  Laterality: N/A;  rigid procto  . RADIOLOGY WITH ANESTHESIA N/A 03/09/2015   Procedure: MRI CERVICAL SPINE WITHOUT AND LUMBER WITHOUT;  Surgeon: Medication Radiologist, MD;  Location: [place];  Service: Radiology;  Laterality: N/A;  . [place]   and implantation of a plate   . STENTED CARDIAC  ARTERY  2007 / 2007 / 2010   X 3 STENT  . THYROID LOBECTOMY  1991   removal of left lobe thyroid  . TOTAL ABDOMINAL HYSTERECTOMY  1973      Social History:     Social History   Tobacco Use  . Smoking status: Former Smoker    Packs/day: 1.00    Years: 40.00    Pack years: 40.00    Types: Cigarettes    Last attempt to quit: 06/26/1988    Years since quitting: 30.1  . Smokeless tobacco: Never Used  . Tobacco comment: 40 pack years  Substance Use Topics  . Alcohol use: No         Family History :     Family History  Problem Relation Age of Onset  . Other Mother 75       Died from sepsis  . Stroke Father 70       died  . Heart disease Father   . Diabetes Father   . Cancer Sister        lung  . Diabetes Sister   . Cancer Brother        lung  . Diabetes Brother   . Diabetes Brother        Home Medications:   Prior to Admission medications  Medication Sig Start Date End Date Taking? Authorizing Provider  aspirin EC 81 MG tablet Take 1 tablet (81 mg total) by mouth daily. 11/18/13   Fay Records, MD  esomeprazole (NEXIUM) 40 MG capsule Take 40 mg by mouth daily.     [provider]  fluticasone (FLONASE) 50 MCG/ACT nasal spray Place 1 spray into both nostrils daily. 07/11/17   Ghimire, Henreitta Leber, MD  HYDROcodone-acetaminophen (NORCO) 7.5-325 MG tablet Take 1 tablet by mouth every 6 (six) hours as needed for moderate pain.    [provider]  imipramine (TOFRANIL) 25 MG tablet Take 75 mg by mouth at bedtime.  05/26/09   [provider]  insulin lispro (HUMALOG KWIKPEN) 100 UNIT/ML KiwkPen Inject 5-6 Units into the skin 3 (three) times daily with meals. Inject 6 units subcutaneously with breakfast and lunch, inject 5 units with supper    [provider]  levothyroxine (SYNTHROID, LEVOTHROID) 100 MCG tablet Take 100 mcg by mouth daily before breakfast.    [provider]  montelukast (SINGULAIR) 10 MG tablet Take 10 mg by mouth at bedtime.  05/10/15   [provider]  predniSONE (DELTASONE) 10 MG tablet 4 tabs for 2 days, then 3 tabs for 2 days, 2 tabs for 2 days, then 1 tab for 2 days, then stop 07/05/18   Lauraine Rinne, NP  predniSONE (DELTASONE) 5 MG tablet TAKE 1 TABLET ONCE A DAY WITH BREAKFAST 07/11/18   Chesley Mires, MD  triazolam (HALCION) 0.25 MG tablet Take 0.25 mg by mouth at bedtime as needed for sleep. For sleep    [provider]     Allergies:     Allergies  Allergen Reactions  . Sulfa Antibiotics Other (See Comments)    Other reaction(s): Other (See Comments) Other Reaction: anaphalaxsis  Other Reaction: anaphalaxsis   . Lantus [Insulin Glargine]     Patient states "sulfate binder causes sinus infection/pneumonia".  [place] [Levofloxacin In D5w] Other (See Comments)    Muscle aches  . Maxidex [Dexamethasone] Other (See Comments)    Insomnia, anxiety, dizziness  . Metoprolol-Hydrochlorothiazide Other (See Comments)     decreased bp  . Norvasc [Amlodipine] Other (See Comments)    BIL Ankle edema  . Novolog [Insulin Aspart]     Patient states "sulfate binder causes sinus infection/pneumonia". Tolerates Humalog.  . Peanut Oil     Other reaction(s): Rash And throat tightening  . Penciclovir     Other reaction(s): Muscle Pain  . Senna [Sennosides] Other (See Comments)    Pt states that it causes a stinging sensation  . Statins     All of them  make her feel bad  . Sulfonamide Derivatives Hives  . Titanium Other (See Comments)    Neck wouldn't heal with titanium cervical plate  . Yellow Dye     Other reaction(s): Bee stings all over body when eating/taking medications with red or yellow dye   . Adhesive [Tape] Hives and Rash     Physical Exam:   Vitals  Blood pressure (!) 112/38, pulse 89, resp. rate (!) 21, SpO2 95 %.   1. General elderly white Caucasian female lying in hospital bed in no distress, appears somewhat sick  2. Normal affect and insight, Not Suicidal or Homicidal, Awake Alert, Oriented X 3.  3. No F.N deficits, ALL C.Nerves Intact, Strength 5/5 all 4 extremities, Sensation intact all 4 extremities, Plantars down going.  4. Ears and Eyes appear Normal, Conjunctivae clear, PERRLA. Moist  Oral Mucosa.  5. Supple Neck, No JVD, No cervical lymphadenopathy appriciated, No Carotid Bruits.  6. Symmetrical Chest wall movement, Good air movement bilaterally, RUL rales  7. RRR, No Gallops, Rubs or Murmurs, No Parasternal Heave.  8. Positive Bowel Sounds, Abdomen Soft, No tenderness, No organomegaly appriciated,No rebound -guarding or rigidity.  9.  No Cyanosis, Normal Skin Turgor, No Skin Rash or Bruise.  10. Good muscle tone,  joints appear normal , no effusions, Normal ROM.  11. No Palpable Lymph Nodes in Neck or Axillae      Data Review:    CBC Recent Labs  Lab 07/30/18 1220  WBC 3.9*  HGB 11.0*  HCT 36.9  PLT <5*  MCV 87.2  MCH 26.0  MCHC 29.8*  RDW 14.6   ------------------------------------------------------------------------------------------------------------------  Chemistries  Recent Labs  Lab 07/30/18 1220  NA 135  K 3.6  CL 100  CO2 23  GLUCOSE 185*  BUN 27*  CREATININE 1.02*  CALCIUM 8.3*  AST 20  ALT 15  ALKPHOS 47  BILITOT 0.7   ------------------------------------------------------------------------------------------------------------------ estimated creatinine  clearance is 31.9 mL/min (A) (by C-G formula based on SCr of 1.02 mg/dL (H)). ------------------------------------------------------------------------------------------------------------------ No results for input(s): TSH, T4TOTAL, T3FREE, THYROIDAB in the last 72 hours.  Invalid input(s): FREET3  Coagulation profile No results for input(s): INR, PROTIME in the last 168 hours. ------------------------------------------------------------------------------------------------------------------- No results for input(s): DDIMER in the last 72 hours. -------------------------------------------------------------------------------------------------------------------  Cardiac Enzymes No results for input(s): CKMB, TROPONINI, MYOGLOBIN in the last 168 hours.  Invalid input(s): CK ------------------------------------------------------------------------------------------------------------------    Component Value Date/Time   BNP 214.7 (H) 07/13/2017 1047     ---------------------------------------------------------------------------------------------------------------  Urinalysis    Component Value Date/Time   COLORURINE YELLOW 06/18/2018 0225   APPEARANCEUR CLEAR 06/18/2018 0225   LABSPEC 1.010 06/18/2018 0225   PHURINE 6.0 06/18/2018 0225   GLUCOSEU NEGATIVE 06/18/2018 0225   GLUCOSEU NEGATIVE 03/24/2010 1137   HGBUR NEGATIVE 06/18/2018 0225   BILIRUBINUR NEGATIVE 06/18/2018 0225   KETONESUR NEGATIVE 06/18/2018 0225   PROTEINUR NEGATIVE 06/18/2018 0225   UROBILINOGEN 0.2 05/01/2015 2019   NITRITE NEGATIVE 06/18/2018 0225   LEUKOCYTESUR NEGATIVE 06/18/2018 0225    ----------------------------------------------------------------------------------------------------------------   Imaging Results:    Dg Chest Port 1 View  Result Date: 07/30/2018 CLINICAL DATA:  Dizziness. EXAM: PORTABLE CHEST 1 VIEW COMPARISON:  07/17/2018 06/22/2018.  09/26/2016. FINDINGS: Mediastinum hilar  structures normal. Chronic interstitial changes. Right upper lobe infiltrate consistent with pneumonia. No pleural effusion or pneumothorax IMPRESSION: Right upper lobe infiltrate consistent with pneumonia. Electronically Signed   By: Marcello Moores  Register   On: 07/30/2018 10:51    My personal review of EKG: Rhythm NSR, fused flipped T waves which are consistent with her previous EKG from December 2019   Assessment & Plan:     1.  Sepsis with thrombocytopenia likely due to aspiration pneumonia.  She will be admitted to medical telemetry, blood cultures will be obtained, will also obtain flu panel, place her on IV fluids and IV Unasyn, stress dose steroids as she is already on prednisone, monitor lactate and procalcitonin, since her platelet count is less than 5000 will transfuse her 1 unit of platelet, will also order DIC panel, ADAMTS 13 levels, anemia panel, monitor closely.  Supportive care with oxygen and nebulizer treatments.  Will request speech to evaluate in the morning for now soft diet.  2.  Severe thrombocytopenia.  Likely due to #1 above, see plan above.  Hold home dose aspirin.  3.  History of  scleroderma.  On chronic prednisone 5 mg we will switch her to hydrocortisone 100 every 8 at stress dose.  4. Hypothyroidism.  Check TSH continue home dose Synthroid.  5.  History of Zenker's diverticulum and GERD.  Increases the risk of aspiration, PPI, soft diet, speech eval.  If all stable outpatient GI follow-up.    DVT Prophylaxis   SCDs    AM Labs Ordered, also please review Full Orders  Family Communication: Admission, patients condition and plan of care including tests being ordered have been discussed with the patient who indicate understanding and agree with the plan and Code Status.  Code Status Full  Likely DC to  TBD  Condition GUARDED    Consults called: Haem    Admission status: Inpt    Time spent in minutes : 35   Lala Lund M.D on 07/30/2018 at 2:17 PM  To  page go to [email or web address] - password [email or web address]

## 2018-07-30 NOTE — Progress Notes (Signed)
Pharmacy Antibiotic Note  [name] is a 85 y.o. female admitted on 07/30/2018 with pneumonia.  Pharmacy has been consulted for unasyn dosing. WBC is slightly low and lactic acid is elevated at 3.8. SCr is WNL.   Plan: Unasyn 3gm IV Q8H F/u renal fxn, C&S, clinical status     No data recorded.  Recent Labs  Lab 07/30/18 1102 07/30/18 1220  WBC  --  3.9*  CREATININE  --  1.02*  LATICACIDVEN 3.8*  --     Estimated Creatinine Clearance: 31.9 mL/min (A) (by C-G formula based on SCr of 1.02 mg/dL (H)).    Allergies  Allergen Reactions  . Sulfa Antibiotics Other (See Comments)    Other reaction(s): Other (See Comments) Other Reaction: anaphalaxsis  Other Reaction: anaphalaxsis   . Lantus [Insulin Glargine]     Patient states "sulfate binder causes sinus infection/pneumonia".  Mack Hook [Levofloxacin In D5w] Other (See Comments)    Muscle aches  . Maxidex [Dexamethasone] Other (See Comments)    Insomnia, anxiety, dizziness  . Metoprolol-Hydrochlorothiazide Other (See Comments)     decreased bp  . Norvasc [Amlodipine] Other (See Comments)    BIL Ankle edema  . Novolog [Insulin Aspart]     Patient states "sulfate binder causes sinus infection/pneumonia". Tolerates Humalog.  . Peanut Oil     Other reaction(s): Rash And throat tightening  . Penciclovir     Other reaction(s): Muscle Pain  . Senna [Sennosides] Other (See Comments)    Pt states that it causes a stinging sensation  . Statins     All of them make her feel bad  . Sulfonamide Derivatives Hives  . Titanium Other (See Comments)    Neck wouldn't heal with titanium cervical plate  . Yellow Dye     Other reaction(s): Bee stings all over body when eating/taking medications with red or yellow dye   . Adhesive [Tape] Hives and Rash    Antimicrobials this admission: Unasyn 2/4>> Cefepime x 1 2/4  Dose adjustments this admission: N/A  Microbiology results: Pending  Thank you for allowing pharmacy to be  a part of this patient's care.  Jaxson Anglin, Rande Lawman 07/30/2018 1:49 PM

## 2018-07-30 NOTE — ED Notes (Signed)
Per MD, if second iv not established before 1st unit of platelets infused, then give 105ml bolus after 1st unit given

## 2018-07-30 NOTE — ED Notes (Signed)
Pt with temp 99.1 before platelet transfusion

## 2018-07-30 NOTE — ED Triage Notes (Signed)
Pt to ED from home, had episode of dizziness and fell, says she just sat down in the floo. C/o no new pain from fall, no LOC. Initial Bp 60/30. Pt received 4 mg zofran and 600 ml NS in route. Recent d/c for UTI/sepsis. A/O x 4 on arrival.

## 2018-07-31 ENCOUNTER — Encounter (HOSPITAL_COMMUNITY): Payer: Self-pay | Admitting: General Practice

## 2018-07-31 ENCOUNTER — Ambulatory Visit: Payer: Medicare Other | Admitting: Acute Care

## 2018-07-31 ENCOUNTER — Inpatient Hospital Stay (HOSPITAL_COMMUNITY): Payer: Medicare Other

## 2018-07-31 DIAGNOSIS — K225 Diverticulum of esophagus, acquired: Secondary | ICD-10-CM

## 2018-07-31 DIAGNOSIS — D638 Anemia in other chronic diseases classified elsewhere: Secondary | ICD-10-CM

## 2018-07-31 DIAGNOSIS — I10 Essential (primary) hypertension: Secondary | ICD-10-CM

## 2018-07-31 LAB — GLUCOSE, CAPILLARY
Glucose-Capillary: 136 mg/dL — ABNORMAL HIGH (ref 70–99)
Glucose-Capillary: 96 mg/dL (ref 70–99)
Glucose-Capillary: 140 mg/dL — ABNORMAL HIGH (ref 70–99)
Glucose-Capillary: 267 mg/dL — ABNORMAL HIGH (ref 70–99)

## 2018-07-31 LAB — COMPREHENSIVE METABOLIC PANEL
ALT: 30 U/L (ref 0–44)
AST: 29 U/L (ref 15–41)
Albumin: 2.4 g/dL — ABNORMAL LOW (ref 3.5–5.0)
Alkaline Phosphatase: 50 U/L (ref 38–126)
Anion gap: 10 (ref 5–15)
BUN: 13 mg/dL (ref 8–23)
CO2: 22 mmol/L (ref 22–32)
Calcium: 7.8 mg/dL — ABNORMAL LOW (ref 8.9–10.3)
Chloride: 102 mmol/L (ref 98–111)
Creatinine, Ser: 0.8 mg/dL (ref 0.44–1.00)
GFR calc Af Amer: >60 mL/min (ref >60)
GFR calc non Af Amer: >60 mL/min (ref >60)
Glucose, Bld: 210 mg/dL — ABNORMAL HIGH (ref 70–99)
Potassium: 3.9 mmol/L (ref 3.5–5.1)
Sodium: 134 mmol/L — ABNORMAL LOW (ref 135–145)
Total Bilirubin: 0.6 mg/dL (ref 0.3–1.2)
Total Protein: 5.2 g/dL — ABNORMAL LOW (ref 6.5–8.1)

## 2018-07-31 LAB — BPAM PLATELET PHERESIS
Blood Product Expiration Date: 202002062359
ISSUE DATE / TIME: 202002041505
Unit Type and Rh: 6200

## 2018-07-31 LAB — PREPARE PLATELET PHERESIS: Unit division: 0

## 2018-07-31 LAB — URINE CULTURE: Culture: NO GROWTH

## 2018-07-31 LAB — CBC
HCT: 31.7 % — ABNORMAL LOW (ref 36.0–46.0)
Hemoglobin: 10 g/dL — ABNORMAL LOW (ref 12.0–15.0)
MCH: 25.7 pg — ABNORMAL LOW (ref 26.0–34.0)
MCHC: 31.5 g/dL (ref 30.0–36.0)
MCV: 81.5 fL (ref 80.0–100.0)
Platelets: 210 10*3/uL (ref 150–400)
RBC: 3.89 MIL/uL (ref 3.87–5.11)
RDW: 14.7 % (ref 11.5–15.5)
WBC: 17.6 10*3/uL — ABNORMAL HIGH (ref 4.0–10.5)
nRBC: 0 % (ref 0.0–0.2)

## 2018-07-31 LAB — ADAMTS13 ACTIVITY REFLEX

## 2018-07-31 LAB — HIV ANTIBODY (ROUTINE TESTING W REFLEX): HIV Screen 4th Generation wRfx: NONREACTIVE

## 2018-07-31 LAB — HEPATITIS PANEL, ACUTE
HCV Ab: <0.1 s/co ratio (ref 0.0–0.9)
Hep A IgM: NEGATIVE
Hep B C IgM: NEGATIVE
Hepatitis B Surface Ag: NEGATIVE

## 2018-07-31 LAB — PROCALCITONIN: Procalcitonin: 4.69 ng/mL

## 2018-07-31 LAB — MAGNESIUM: Magnesium: 1.3 mg/dL — ABNORMAL LOW (ref 1.7–2.4)

## 2018-07-31 LAB — ADAMTS13 ACTIVITY: Adamts 13 Activity: 47.3 % — ABNORMAL LOW (ref >66.8)

## 2018-07-31 MED ORDER — TAMSULOSIN HCL 0.4 MG PO CAPS
0.4000 mg | ORAL_CAPSULE | Freq: Every day | ORAL | Status: DC
  Administered 2018-07-31: 0.4 mg via ORAL
  Filled 2018-07-31 (×2): qty 1

## 2018-07-31 MED ORDER — LACTATED RINGERS IV SOLN
INTRAVENOUS | Status: AC
  Administered 2018-07-31: 10:00:00 via INTRAVENOUS

## 2018-07-31 MED ORDER — MAGNESIUM SULFATE 2 GM/50ML IV SOLN
2.0000 g | Freq: Once | INTRAVENOUS | Status: AC
  Administered 2018-07-31: 2 g via INTRAVENOUS
  Filled 2018-07-31: qty 50

## 2018-07-31 MED ORDER — TRIAZOLAM 0.125 MG PO TABS
0.2500 mg | ORAL_TABLET | Freq: Every evening | ORAL | Status: DC | PRN
  Administered 2018-07-31 – 2018-08-02 (×3): 0.25 mg via ORAL
  Filled 2018-07-31 (×3): qty 2

## 2018-07-31 NOTE — Plan of Care (Signed)
Lungs clear, patient out of bed tolerated well no shortness of breath noted on exertion.   Swallow study completed.

## 2018-07-31 NOTE — Evaluation (Signed)
Physical Therapy Evaluation Patient Details Name: [name] MRN: [id number] DOB: 22-Mar-1931 Today's Date: 07/31/2018   History of Present Illness  Pt is an 85 y/o female admitted secondary to dizziness. Pt found to have PNA and possibly sepsis. PMH including but not limited to DM, HTN, COPD, CAD, CVA, macular degeneration.    Clinical Impression  Pt presented sitting EOB, awake and willing to participate in therapy session. Prior to admission, pt reported that she was ambulating with use of a cane PRN when outside and independent with ADLs. Pt has a family member or friend that drives her wherever she needs to go (has not driven in >62 years due to visual deficits). Currently pt and supervision to min guard level for all functional mobility. Pt would continue to benefit from skilled physical therapy services at this time while admitted and after d/c to address the below listed limitations in order to improve overall safety and independence with functional mobility.      Follow Up Recommendations Home health PT    Equipment Recommendations  None recommended by PT    Recommendations for Other Services       Precautions / Restrictions Precautions Precautions: Fall Restrictions Weight Bearing Restrictions: No      Mobility  Bed Mobility Overal bed mobility: Needs Assistance Bed Mobility: Sit to Supine       Sit to supine: Supervision   General bed mobility comments: supervision for safety  Transfers Overall transfer level: Needs assistance Equipment used: None Transfers: Sit to/from Stand Sit to Stand: Supervision         General transfer comment: supervision for safety; pt performed from EOB x3  Ambulation/Gait Ambulation/Gait assistance: Min guard;Supervision Gait Distance (Feet): 100 Feet Assistive device: IV Pole Gait Pattern/deviations: Step-through pattern;Decreased stride length Gait velocity: decreased   General Gait Details: pt generally steady  with one UE on IV pole, no overt LOB or need for physical assistance, min guard initially and progressing to supervision  Stairs            Wheelchair Mobility    Modified Rankin (Stroke Patients Only)       Balance Overall balance assessment: Needs assistance Sitting-balance support: Feet supported Sitting balance-Leahy Scale: Good     Standing balance support: No upper extremity supported;During functional activity Standing balance-Leahy Scale: Fair                               Pertinent Vitals/Pain Pain Assessment: No/denies pain    Home Living Family/patient expects to be discharged to:: Private residence Living Arrangements: Alone Available Help at Discharge: Family;Friend(s);Available PRN/intermittently Type of Home: House Home Access: Stairs to enter   Entrance Stairs-Number of Steps: 1 Home Layout: Two level;Able to live on main level with bedroom/bathroom Home Equipment: Kasandra Knudsen - single point;Walker - 4 wheels      Prior Function Level of Independence: Independent with assistive device(s)         Comments: was using a cane PRN when outside on uneven surfaces; pt was receiving HHPT 1-2x/week prior to admission     Hand Dominance        Extremity/Trunk Assessment   Upper Extremity Assessment Upper Extremity Assessment: Overall WFL for tasks assessed    Lower Extremity Assessment Lower Extremity Assessment: Overall WFL for tasks assessed       Communication   Communication: No difficulties  Cognition Arousal/Alertness: Awake/alert Behavior During Therapy: WFL for tasks assessed/performed Overall  Cognitive Status: Within Functional Limits for tasks assessed                                        General Comments      Exercises     Assessment/Plan    PT Assessment Patient needs continued PT services  PT Problem List Decreased balance;Decreased mobility       PT Treatment Interventions DME  instruction;Gait training;Stair training;Therapeutic activities;Functional mobility training;Therapeutic exercise;Balance training;Neuromuscular re-education;Patient/family education    PT Goals (Current goals can be found in the Care Plan section)  Acute Rehab PT Goals Patient Stated Goal: "to get back to being independent" PT Goal Formulation: With patient Time For Goal Achievement: 08/14/18 Potential to Achieve Goals: Good    Frequency Min 3X/week   Barriers to discharge        Co-evaluation               AM-PAC PT "6 Clicks" Mobility  Outcome Measure Help needed turning from your back to your side while in a flat bed without using bedrails?: None Help needed moving from lying on your back to sitting on the side of a flat bed without using bedrails?: None Help needed moving to and from a bed to a chair (including a wheelchair)?: None Help needed standing up from a chair using your arms (e.g., wheelchair or bedside chair)?: None Help needed to walk in hospital room?: None Help needed climbing 3-5 steps with a railing? : A Lot 6 Click Score: 22    End of Session Equipment Utilized During Treatment: Gait belt Activity Tolerance: Patient tolerated treatment well Patient left: in bed;with call bell/phone within reach Nurse Communication: Mobility status PT Visit Diagnosis: Other abnormalities of gait and mobility (R26.89)    Time: 3437-3578 PT Time Calculation (min) (ACUTE ONLY): 21 min   Charges:   PT Evaluation $PT Eval Moderate Complexity: 1 Mod          Sherie Don, PT, DPT  Acute Rehabilitation Services Pager [phone] Office [place] 07/31/2018, 1:37 PM

## 2018-07-31 NOTE — Progress Notes (Signed)
Pt has no IV and refusing to be poked again at this time. IV team tried twice. She has Unasyn and Solu-cortef due and Dr. Baltazar Najjar page at this time

## 2018-07-31 NOTE — Progress Notes (Signed)
Dr. Baltazar Najjar notified of patient refusing to take novolog and states a reaction to it. She does take humalog at home and did not bring.

## 2018-07-31 NOTE — Evaluation (Signed)
Clinical/Bedside Swallow Evaluation Patient Details  Name: [name] MRN: [id number] Date of Birth: 07/15/1930  Today's Date: 07/31/2018 Time: SLP Start Time (ACUTE ONLY): 9147 SLP Stop Time (ACUTE ONLY): 0948 SLP Time Calculation (min) (ACUTE ONLY): 10 min  Past Medical History:  Past Medical History:  Diagnosis Date  . Adenomatous colon polyp   . Arthritis   . Blood transfusion   . Cancer [facility])    cervical cancer pre hysterectomy  . Cervical disc disease   . Chronic sinusitis   . CONSTIPATION, CHRONIC 06/26/2007   Qualifier: Diagnosis of  By: [name] CMA ([place]), [name]    . COPD (chronic obstructive pulmonary disease) ([place])   . Coronary artery disease    prior MI with stenting of RCA with repeat PTCA/stenting for ISR in 01/2008, stent to LM 11/2008  . CVA (cerebral infarction) 1991  . Depression   . Diabetes mellitus   . Diverticulosis   . 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 Dyslipidemia   . Emphysema   . Fibromyalgia   . GERD (gastroesophageal reflux disease)   . Hypertension   . Hypothyroidism   . IBS (irritable bowel syndrome)   . Legally blind   . Macular degeneration   . Myocardial infarction [facility]) may 27th 2007  . Pneumonia 07/16/2013    HX PNEUMONIA  . Pulmonary hypertension ([place])   . Raynaud's phenomenon   . Scleroderma ([place])   . Sjogren's disease ([place])   . Sleep apnea    STOP BANG SCORE 5  . Stroke [facility]) 1990   brain stem stroke - weakness rt hand  . Zenker's diverticulum    Past Surgical History:  Past Surgical History:  Procedure Laterality Date  . APPENDECTOMY    . BALLOON DILATION N/A 07/15/2015   Procedure: BALLOON DILATION;  Surgeon: Renee Banister, MD;  Location: [facility];  Service: Endoscopy;  Laterality: N/A;  . BILATERAL OOPHORECTOMY  2002  . BRONCHOSCOPY  02-22-09  . CARDIAC CATHETERIZATION     x 3  . CATARACT EXTRACTION    . CHOLECYSTECTOMY  1965  . COLON SURGERY  2014   colon resection  . colonectomy    .  ESOPHAGOGASTRODUODENOSCOPY (EGD) WITH ESOPHAGEAL DILATION N/A 06/18/2013   Procedure: ESOPHAGOGASTRODUODENOSCOPY (EGD) WITH ESOPHAGEAL DILATION;  Surgeon: Renee Castle, MD;  Location: [place];  Service: Endoscopy;  Laterality: N/A;  . FLEXIBLE SIGMOIDOSCOPY N/A 07/15/2015   Procedure: FLEXIBLE SIGMOIDOSCOPY;  Surgeon: Renee Banister, MD;  Location: [facility];  Service: Endoscopy;  Laterality: N/A;  . lobe thyroid removal    . PROCTOSCOPY  03/18/2012   Procedure: PROCTOSCOPY;  Surgeon: Renee Hector, MD;  Location: [facility];  Service: General;  Laterality: N/A;  rigid procto  . RADIOLOGY WITH ANESTHESIA N/A 03/09/2015   Procedure: MRI CERVICAL SPINE WITHOUT AND LUMBER WITHOUT;  Surgeon: Medication Radiologist, MD;  Location: [place];  Service: Radiology;  Laterality: N/A;  . [place]   and implantation of a plate   . STENTED CARDIAC  ARTERY  2007 / 2007 / 2010   X 3 STENT  . THYROID LOBECTOMY  1991   removal of left lobe thyroid  . TOTAL ABDOMINAL HYSTERECTOMY  1973   HPI:  [name]  is a 85 y.o. female, history of Zenker's diverticulum, aspiration pneumonia in the past, hypertension, DM type II, dyslipidemia, fibromyalgia, GERD, stroke in the past with mild weakness in the right hand, both thyroidism, Sjogren's syndrome, Scleroderma on low-dose chronic steroids, who was  hospitalized in December for aspiration pneumonia and sepsis. Per chart pt states several days ago she probably had a gagging episode while eating food, she woke up this morning feeling feverish having chills, went to the bathroom where she had a near syncopal episode. Found to have right-sided pneumonia with possible sepsis and severe thrombocytopenia. MBS 07/18/13 revealing primary cervical dysphagia with residue, limited UES opening likely due to pressure of cervical spine. Head turn to left and multiple sips cleared stasis.    Assessment / Plan / Recommendation Clinical Impression  Pt is very lucid,  able to state history of esophageal dysphagia, recent pna and current illness. She is aware and stated "I know it's time to do something again for my swallowing." No overt s/s aspiration with straw sips thin while consuming pills with RN. MBS recommended given pna (current and 30 days ago), history of aspiration and esophageal dysphagia, MBS is recommended and scheduled today at 10:30.   SLP Visit Diagnosis: Dysphagia, unspecified (R13.10)    Aspiration Risk  Moderate aspiration risk    Diet Recommendation Thin liquid;Other (Comment)(soft)   Liquid Administration via: Straw;Cup Medication Administration: Whole meds with liquid Supervision: Patient able to self feed Compensations: Slow rate;Small sips/bites;Follow solids with liquid Postural Changes: Seated upright at 90 degrees;Remain upright for at least 30 minutes after po intake    Other  Recommendations Oral Care Recommendations: Oral care BID   Follow up Recommendations Other (comment)(TBD)      Frequency and Duration            Prognosis        Swallow Study   General HPI: [name]  is a 85 y.o. female, history of Zenker's diverticulum, aspiration pneumonia in the past, hypertension, DM type II, dyslipidemia, fibromyalgia, GERD, stroke in the past with mild weakness in the right hand, both thyroidism, Sjogren's syndrome, Scleroderma on low-dose chronic steroids, who was hospitalized in December for aspiration pneumonia and sepsis. Per chart pt states several days ago she probably had a gagging episode while eating food, she woke up this morning feeling feverish having chills, went to the bathroom where she had a near syncopal episode. Found to have right-sided pneumonia with possible sepsis and severe thrombocytopenia. MBS 07/18/13 revealing primary cervical dysphagia with residue, limited UES opening likely due to pressure of cervical spine. Head turn to left and multiple sips cleared stasis.  Type of Study: Bedside Swallow  Evaluation Previous Swallow Assessment: (see HPI) Diet Prior to this Study: NPO;Other (Comment)(soft) Temperature Spikes Noted: No Respiratory Status: Nasal cannula History of Recent Intubation: No Behavior/Cognition: Alert;Cooperative;Pleasant mood Oral Cavity Assessment: Within Functional Limits Oral Care Completed by SLP: No Oral Cavity - Dentition: Adequate natural dentition Vision: Functional for self-feeding Self-Feeding Abilities: Able to feed self Patient Positioning: Upright in bed Baseline Vocal Quality: Normal Volitional Cough: Strong Volitional Swallow: Able to elicit    Oral/Motor/Sensory Function Overall Oral Motor/Sensory Function: Within functional limits   Ice Chips Ice chips: Not tested   Thin Liquid Thin Liquid: Within functional limits Presentation: Straw    Nectar Thick Nectar Thick Liquid: Not tested   Honey Thick Honey Thick Liquid: Not tested   Puree Puree: Not tested   Solid     Solid: Not tested      [place] Siren 07/31/2018,10:00 AM   [name] [place].Ed Risk analyst [phone] Office [phone]

## 2018-07-31 NOTE — Progress Notes (Signed)
[facility]  Patient Status: Active (receiving services up to time of hospitalization)  Was being followed by RN initially but refused further RN visits.  [facility] is providing the following services: PT  If patient discharges after hours, please call [phone].   [name] 07/31/2018, 10:36 AM

## 2018-07-31 NOTE — Progress Notes (Signed)
I agree with previous nurse's assessment. 

## 2018-07-31 NOTE — Progress Notes (Addendum)
Modified Barium Swallow Progress Note  Patient Details  Name: [name] MRN: [id number] Date of Birth: [birth year]  Today's Date: 07/31/2018  Modified Barium Swallow completed.  Full report located under Chart Review in the Imaging Section.  Brief recommendations include the following:  Clinical Impression  Pt demonstrated normal neurologically intact swallow function across liquids and solid consistencies. Pt able to manipulate and control bolus to propel to oropharynx timely and efficiently. Swallow initiation was prompt with adequate laryngeal closure, epiglottic deflection, pharyngeal stripping wave that prevented aspiration and problematic penetration. Pharynx was absent of significant residue. Esophagus was briefly viewed (non diagnostic) and observed previously diagnosed Zenker's diverticulum. If aspiration occurs, given results of MBS, suspect it would be post prandial. At end of study, SLP educated pt on esophageal precautions and pt voiced understanding. No further follow up needed.  Recommend regular texture, thin liquids, pills with thin and strategies to mitigate reflux symptoms.       Swallow Evaluation Recommendations       SLP Diet Recommendations: Regular solids;Thin liquid   Liquid Administration via: Straw;Cup   Medication Administration: Whole meds with liquid   Supervision: Patient able to self feed   Compensations: Follow solids with liquid   Postural Changes: Remain semi-upright after after feeds/meals (Comment);Seated upright at 90 degrees   Oral Care Recommendations: Oral care BID        [place] Siren 07/31/2018,2:12 PM   [name] [place].Ed Risk analyst [phone] Office [phone]

## 2018-07-31 NOTE — Progress Notes (Signed)
PROGRESS NOTE                                                                                                                                                                                                             Patient Demographics:    [name], is a 85 y.o. female, DOB - 1930-08-23, AYT:[id number]  Admit date - 07/30/2018   Admitting Physician Thurnell Lose, MD  Outpatient Primary MD for the patient is Merrilee Seashore, MD  LOS - 1  Chief Complaint  Patient presents with  . Fall  . Dizziness       Brief Narrative   Elvina Bosch  is a 85 y.o. female, history of Zenker's diverticulum, aspiration pneumonia in the past, hypertension, DM type II, dyslipidemia, myalgia, GERD, stroke in the past with mild weakness in the right hand, both thyroidism, Sjogren's syndrome, Scleroderma on low-dose chronic steroids, who was hospitalized in December for aspiration pneumonia and sepsis.  She was discharged home she did very well for the last month, she says about 2 3 days ago she probably had a gagging episode while eating food, she came in with fever was diagnosed with sepsis VS thrombocytopenia due to recurrent pneumonia likely aspiration and admitted to the hospital.   Subjective:    Allean Found today has, No headache, No chest pain, No abdominal pain - No Nausea, No new weakness tingling or numbness, No Cough - SOB.     Assessment  & Plan :     1.  Sepsis with thrombocytopenia  due to aspiration pneumonia.    She has responded well to treatment thus far, continue IV Unasyn, has been hydrated adequately, sepsis pathophysiology seems to have resolved, platelet count has stabilized, monitor cultures, continue supportive care with oxygen and nebulizer treatment. Advance activity. Trend procalcitonin.  2.  Severe thrombocytopenia.  Likely due to #1 above, the 1 unit of platelets on 07/30/2018, DIC panel negative, follow AdamTS13 activity levels which are pending  likely to be unremarkable. Appreciate hematology input.  3.  History of scleroderma.  On chronic prednisone 5 mg, currently on stress dose steroids will start tapering gradually and keep an eye on blood pressure.  4. Hypothyroidism.    Stable TSH will continue home dose Synthroid.  5.  History of Zenker's diverticulum and GERD.  Increases the risk of aspiration, PPI, soft diet, speech eval.  If all stable outpatient GI follow-up.  47.  Rosanne Gutting retention.  Placed on Foley Flomax, trial of Foley removal  early morning 08/01/2018 with bladder scan subsequently.    Family Communication  :  None  Code Status :  Full  Disposition Plan  :  TBD  Consults  :  Haerm  Procedures  :      DVT Prophylaxis  :    SCDs   Lab Results  Component Value Date   PLT 210 07/31/2018    Diet :  Diet Order            DIET SOFT Room service appropriate? Yes; Fluid consistency: Thin  Diet effective now               Inpatient Medications Scheduled Meds: . fluticasone  1 spray Each Nare Daily  . hydrocortisone sod succinate (SOLU-CORTEF) inj  100 mg Intravenous Q8H  . insulin aspart  0-5 Units Subcutaneous QHS  . insulin aspart  0-9 Units Subcutaneous TID WC  . levothyroxine  100 mcg Oral QAC breakfast  . pantoprazole  40 mg Oral Daily   Continuous Infusions: . ampicillin-sulbactam (UNASYN) IV 3 g (07/31/18 0543)  . lactated ringers    . magnesium sulfate 1 - 4 g bolus IVPB     PRN Meds:.acetaminophen **OR** [DISCONTINUED] acetaminophen, diphenhydrAMINE, docusate sodium, triazolam  Antibiotics  :   Anti-infectives (From admission, onward)   Start     Dose/Rate Route Frequency Ordered Stop   07/30/18 1400  Ampicillin-Sulbactam (UNASYN) 3 g in sodium chloride 0.9 % 100 mL IVPB     3 g 200 mL/hr over 30 Minutes Intravenous Every 8 hours 07/30/18 1348     07/30/18 1200  ceFEPIme (MAXIPIME) 2 g in sodium chloride 0.9 % 100 mL IVPB     2 g 200 mL/hr over 30 Minutes Intravenous  Once 07/30/18  1158 07/30/18 1334          Objective:   Vitals:   07/30/18 1850 07/30/18 1908 07/30/18 2239 07/31/18 0340  BP:  109/69 (!) 119/45 (!) 125/37  Pulse:  82 77 76  Resp:  18 18 16   Temp:  98.8 F (37.1 C) 98.3 F (36.8 C) 98.2 F (36.8 C)  TempSrc:  Oral Oral Oral  SpO2:  96% 96% 97%  Weight: 57.2 kg     Height: 5\' 2"  (1.575 m)       Wt Readings from Last 3 Encounters:  07/30/18 57.2 kg  07/17/18 59.4 kg  07/12/18 59.3 kg     Intake/Output Summary (Last 24 hours) at 07/31/2018 0903 Last data filed at 07/31/2018 0544 Gross per 24 hour  Intake 3915 ml  Output 1650 ml  Net 2265 ml     Physical Exam  Awake Alert, Oriented X 3, No new F.N deficits, Normal affect [place].AT,PERRAL Supple Neck,No JVD, No cervical lymphadenopathy appriciated.  Symmetrical Chest wall movement, Good air movement bilaterally, RUL rales RRR,No Gallops,Rubs or new Murmurs, No Parasternal Heave +ve B.Sounds, Abd Soft, No tenderness, No organomegaly appriciated, No rebound - guarding or rigidity. No Cyanosis, Clubbing or edema, No new Rash or bruise       Data Review:    CBC Recent Labs  Lab 07/30/18 1220 07/30/18 1404 07/31/18 0433  WBC 3.9*  --  17.6*  HGB 11.0*  --  10.0*  HCT 36.9  --  31.7*  PLT <5* 175 210  MCV 87.2  --  81.5  MCH 26.0  --  25.7*  MCHC 29.8*  --  31.5  RDW 14.6  --  14.7    Chemistries  Recent Labs  Lab 07/30/18 1220 07/31/18 0433  NA 135 134*  K 3.6 3.9  CL 100 102  CO2 23 22  GLUCOSE 185* 210*  BUN 27* 13  CREATININE 1.02* 0.80  CALCIUM 8.3* 7.8*  MG  --  1.3*  AST 20 29  ALT 15 30  ALKPHOS 47 50  BILITOT 0.7 0.6   ------------------------------------------------------------------------------------------------------------------ No results for input(s): CHOL, HDL, LDLCALC, TRIG, CHOLHDL, LDLDIRECT in the last 72 hours.  Lab Results  Component Value Date   HGBA1C 8.0 (H) 07/30/2018    ------------------------------------------------------------------------------------------------------------------ Recent Labs    07/30/18 1405  TSH 1.016   ------------------------------------------------------------------------------------------------------------------ Recent Labs    07/30/18 1405  VITAMINB12 454  FOLATE 5.6*  FERRITIN 57  TIBC 304  IRON 14*  RETICCTPCT 2.0    Coagulation profile Recent Labs  Lab 07/30/18 1404  INR 1.09    Recent Labs    07/30/18 1404  DDIMER 0.96*    Cardiac Enzymes No results for input(s): CKMB, TROPONINI, MYOGLOBIN in the last 168 hours.  Invalid input(s): CK ------------------------------------------------------------------------------------------------------------------    Component Value Date/Time   BNP 214.7 (H) 07/13/2017 1047    Micro Results No results found for this or any previous visit (from the past 240 hour(s)).  Radiology Reports Dg Chest 2 View  Result Date: 07/17/2018 CLINICAL DATA:  Evaluate for pneumonia. EXAM: CHEST - 2 VIEW COMPARISON:  07/05/2018. FINDINGS: Normal heart size. Lungs appear hyperinflated with diffuse coarsened interstitial markings bilaterally. There is been interval resolution of previous pulmonary edema and pleural effusions. Significant improved aeration to both lungs noted compatible with resolving pneumonia. IMPRESSION: 1. Improved aeration of both lungs compatible with resolving pneumonia. 2. Resolution of previous pulmonary edema and pleural effusions. Electronically Signed   By: Kerby Moors M.D.   On: 07/17/2018 17:21   Dg Chest 2 View  Result Date: 07/05/2018 CLINICAL DATA:  Pneumonia follow-up. EXAM: CHEST - 2 VIEW COMPARISON:  Chest x-ray dated June 22, 2018. FINDINGS: The heart size and mediastinal contours are within normal limits. Diffuse interstitial edema is similar to prior study. Improving patchy airspace disease in the right lung. Unchanged small bilateral pleural  effusions. No pneumothorax. No acute osseous abnormality. IMPRESSION: 1. Improving multifocal pneumonia in the right lung. 2. Unchanged mild interstitial pulmonary edema and small bilateral pleural effusions. Electronically Signed   By: Titus Dubin M.D.   On: 07/05/2018 16:24   Ct Head Wo Contrast  Result Date: 07/30/2018 CLINICAL DATA:  Dizziness and fall today.  Initial encounter. EXAM: CT HEAD WITHOUT CONTRAST TECHNIQUE: Contiguous axial images were obtained from the base of the skull through the vertex without intravenous contrast. COMPARISON:  Head CT scan 05/26/2008.  Brain MRI 01/15/2009. FINDINGS: Brain: No evidence of acute infarction, hemorrhage, hydrocephalus, extra-axial collection or mass lesion/mass effect. Mild atrophy noted. Vascular: No hyperdense vessel or unexpected calcification. Skull: Intact.  No focal lesion. Sinuses/Orbits: Status post cataract surgery.  Otherwise negative. Other: None. IMPRESSION: No acute abnormality. Mild atrophy. Electronically Signed   By: Inge Rise M.D.   On: 07/30/2018 16:11   Dg Chest Port 1 View  Result Date: 07/30/2018 CLINICAL DATA:  Dizziness. EXAM: PORTABLE CHEST 1 VIEW COMPARISON:  07/17/2018 06/22/2018.  09/26/2016. FINDINGS: Mediastinum hilar structures normal. Chronic interstitial changes. Right upper lobe infiltrate consistent with pneumonia. No pleural effusion or pneumothorax IMPRESSION: Right upper lobe infiltrate consistent with pneumonia. Electronically Signed   By: Marcello Moores  Register   On: 07/30/2018 10:51    Time Spent in minutes  30  Lala Lund M.D on 07/31/2018 at 9:03 AM  To page go to [email or web address] - password [email or web address]

## 2018-08-01 LAB — CBC
HCT: 28.7 % — ABNORMAL LOW (ref 36.0–46.0)
Hemoglobin: 9.3 g/dL — ABNORMAL LOW (ref 12.0–15.0)
MCH: 26.6 pg (ref 26.0–34.0)
MCHC: 32.4 g/dL (ref 30.0–36.0)
MCV: 82 fL (ref 80.0–100.0)
Platelets: 169 10*3/uL (ref 150–400)
RBC: 3.5 MIL/uL — ABNORMAL LOW (ref 3.87–5.11)
RDW: 14.9 % (ref 11.5–15.5)
WBC: 12 10*3/uL — ABNORMAL HIGH (ref 4.0–10.5)
nRBC: 0 % (ref 0.0–0.2)

## 2018-08-01 LAB — COMPREHENSIVE METABOLIC PANEL
ALT: 24 U/L (ref 0–44)
AST: 20 U/L (ref 15–41)
Albumin: 2.5 g/dL — ABNORMAL LOW (ref 3.5–5.0)
Alkaline Phosphatase: 51 U/L (ref 38–126)
Anion gap: 7 (ref 5–15)
BUN: 10 mg/dL (ref 8–23)
CO2: 25 mmol/L (ref 22–32)
Calcium: 7.9 mg/dL — ABNORMAL LOW (ref 8.9–10.3)
Chloride: 103 mmol/L (ref 98–111)
Creatinine, Ser: 0.65 mg/dL (ref 0.44–1.00)
GFR calc Af Amer: >60 mL/min (ref >60)
GFR calc non Af Amer: >60 mL/min (ref >60)
Glucose, Bld: 216 mg/dL — ABNORMAL HIGH (ref 70–99)
Potassium: 3.2 mmol/L — ABNORMAL LOW (ref 3.5–5.1)
Sodium: 135 mmol/L (ref 135–145)
Total Bilirubin: 0.6 mg/dL (ref 0.3–1.2)
Total Protein: 5.3 g/dL — ABNORMAL LOW (ref 6.5–8.1)

## 2018-08-01 LAB — GLUCOSE, CAPILLARY
Glucose-Capillary: 131 mg/dL — ABNORMAL HIGH (ref 70–99)
Glucose-Capillary: 138 mg/dL — ABNORMAL HIGH (ref 70–99)
Glucose-Capillary: 143 mg/dL — ABNORMAL HIGH (ref 70–99)
Glucose-Capillary: 200 mg/dL — ABNORMAL HIGH (ref 70–99)

## 2018-08-01 LAB — PROCALCITONIN: Procalcitonin: 2.64 ng/mL

## 2018-08-01 LAB — MAGNESIUM: Magnesium: 1.9 mg/dL (ref 1.7–2.4)

## 2018-08-01 MED ORDER — LORAZEPAM 1 MG PO TABS
1.0000 mg | ORAL_TABLET | Freq: Once | ORAL | Status: AC
  Administered 2018-08-01: 1 mg via ORAL
  Filled 2018-08-01: qty 1

## 2018-08-01 MED ORDER — POTASSIUM CHLORIDE CRYS ER 20 MEQ PO TBCR
40.0000 meq | EXTENDED_RELEASE_TABLET | Freq: Once | ORAL | Status: AC
  Administered 2018-08-01: 40 meq via ORAL
  Filled 2018-08-01: qty 2

## 2018-08-01 MED ORDER — FOLIC ACID 1 MG PO TABS
1.0000 mg | ORAL_TABLET | Freq: Every day | ORAL | Status: DC
  Administered 2018-08-03: 1 mg via ORAL
  Filled 2018-08-01 (×2): qty 1

## 2018-08-01 MED ORDER — AMOXICILLIN-POT CLAVULANATE 500-125 MG PO TABS: 1.0000 | ORAL_TABLET | Freq: Three times a day (TID) | ORAL | 0 refills | Status: DC

## 2018-08-01 MED ORDER — DIPHENHYDRAMINE HCL 50 MG/ML IJ SOLN
12.5000 mg | Freq: Once | INTRAMUSCULAR | Status: DC
  Filled 2018-08-01: qty 1

## 2018-08-01 MED ORDER — DIPHENHYDRAMINE HCL 25 MG PO CAPS
25.0000 mg | ORAL_CAPSULE | Freq: Once | ORAL | Status: AC
  Administered 2018-08-01: 25 mg via ORAL
  Filled 2018-08-01: qty 1

## 2018-08-01 MED ORDER — HYDROCORTISONE NA SUCCINATE PF 100 MG IJ SOLR
50.0000 mg | Freq: Two times a day (BID) | INTRAMUSCULAR | Status: DC
  Administered 2018-08-01 – 2018-08-03 (×4): 50 mg via INTRAVENOUS
  Filled 2018-08-01 (×4): qty 2

## 2018-08-01 NOTE — Progress Notes (Signed)
Pt Temp 100.6 and states she doesn't feel well. MD aware and Tylenol given. Due to fever, per MD, hold DC.

## 2018-08-01 NOTE — Discharge Summary (Signed)
[name] JKK:[id number] DOB: 09/14/1930 DOA: 07/30/2018  PCP: Merrilee Seashore, MD  Admit date: 07/30/2018  Discharge date: 08/01/2018  Admitted From: Home   Disposition:  Home   Recommendations for Outpatient Follow-up:   Follow up with PCP in 1-2 weeks  PCP Please obtain BMP/CBC, 2 view CXR in 1week,  (see Discharge instructions)   PCP Please follow up on the following pending results: Repeat two-view chest x-ray, CBC and BMP next visit   Home Health: SLP, PT, RN  Equipment/Devices: None  Consultations: Speech, Haem Discharge Condition: Stable   CODE STATUS: Full   Diet Recommendation: Soft Heart Healthy   Diet Order            Diet regular Room service appropriate? Yes; Fluid consistency: Thin  Diet effective now               Chief Complaint  Patient presents with  . Fall  . Dizziness     Brief history of present illness from the day of admission and additional interim summary    [name] a85 y.o.female,history of Zenker's diverticulum, aspiration pneumonia in the past, hypertension, DM type II, dyslipidemia, myalgia, GERD, stroke in the past with mild weakness in the right hand, both thyroidism, Sjogren's syndrome,Scleroderma on low-dose chronic steroids, who was hospitalized in December for aspiration pneumonia and sepsis. She was discharged home she did very well for the last month, she says about 2 3 days ago she probably had a gagging episode while eating food, she came in with fever was diagnosed with sepsis VS thrombocytopenia due to recurrent pneumonia likely aspiration and admitted to the hospital.                                                                 Hospital Course    1.Sepsis with thrombocytopenia  due to aspiration pneumonia.   She has responded  well to treatment thus far, he was treated with IV Unasyn along with IV fluids, sepsis pathophysiology has completely resolved, cultures negative for 48 hours.  She is on room air and symptom-free.  She was transitioned to Augmentin today for 5 more days with outpatient PCP follow-up.  Request PCP to check CBC, BMP and a two-view chest x-ray next visit. Kindly see #5 below.  2.Severe thrombocytopenia. Likely due to #1 above, the 1 unit of platelets on 07/30/2018, DIC panel negative, initial thrombocytopenia was likely due to platelet clumping, discussed the case with hematologist Dr. Willy Eddy such, levels are stable now, no further work-up, no suspicion for TTP.  Adam TS 13 levels were borderline low but likely inconsequential per Dr. Alvy Bimler, case discussed with her today.  3.History of scleroderma. On chronic prednisone 5 mg, resume home dose prednisone upon discharge.  4. Hypothyroidism.   Stable TSH will continue home dose  Synthroid.  5.History of Zenker's diverticulum and GERD. Increases the risk of aspiration, PPI, soft diet, PT eval were started and she was cleared, we will discharge her with soft diet and home speech therapist for continued follow-up.  If this problem recurs outpatient GI follow-up or a barium swallow can be considered.  6.  Urinary retention.  Placed on Foley Flomax, Foley was removed earlier this morning at 7 AM, voiding without any problems.  No other issue this problem is resolved.    Discharge diagnosis     Principal Problem:   Thrombocytopenia ([place]) Active Problems:   Hyperlipidemia   Legal blindness   Essential hypertension   ZENKER'S DIVERTICULUM   Systemic sclerosis (HCC)   Anemia of chronic disease   Hypothyroidism    Discharge instructions    Discharge Instructions    Discharge instructions   Complete by:  As directed    Follow with Primary MD Merrilee Seashore, MD in 7 days   Get CBC, CMP, 2 view Chest X ray -  checked  by Primary  MD   in 5-7 days    Activity: As tolerated with Full fall precautions use walker/cane & assistance as needed  Disposition Home    Diet: Soft diet with aspiration precautions.  Special Instructions: If you have smoked or chewed Tobacco  in the last 2 yrs please stop smoking, stop any regular Alcohol  and or any Recreational drug use.  On your next visit with your primary care physician please Get Medicines reviewed and adjusted.  Please request your Prim.MD to go over all Hospital Tests and Procedure/Radiological results at the follow up, please get all Hospital records sent to your Prim MD by signing hospital release before you go home.  If you experience worsening of your admission symptoms, develop shortness of breath, life threatening emergency, suicidal or homicidal thoughts you must seek medical attention immediately by calling 911 or calling your MD immediately  if symptoms less severe.  You Must read complete instructions/literature along with all the possible adverse reactions/side effects for all the Medicines you take and that have been prescribed to you. Take any new Medicines after you have completely understood and accpet all the possible adverse reactions/side effects.   Increase activity slowly   Complete by:  As directed       Discharge Medications   Allergies as of 08/01/2018      Reactions   Peanut Oil Anaphylaxis, Swelling, Rash, Other (See Comments)   Throat tightens, also   Sulfa Antibiotics Anaphylaxis      Titanium Other (See Comments)   Neck wouldn't heal with titanium cervical plate   Yellow Dye Other (See Comments)   "Bee stings" all over body when eating/taking medications with red or yellow dye    Lantus [insulin Glargine] Other (See Comments)   Patient states "sulfate binder causes sinus infection/pneumonia".   Levaquin [levofloxacin In D5w] Other (See Comments)   Muscle aches   Metoprolol-hydrochlorothiazide Other (See Comments)   Decreased B/P    Norvasc [amlodipine] Swelling, Other (See Comments)   BIL Ankle edema   Novolog [insulin Aspart]    Patient states "sulfate binder causes sinus infection/pneumonia". Tolerates Humalog.   Penciclovir Other (See Comments)   Muscle Pain   Senna [sennosides] Other (See Comments)   Pt states that it causes a "stinging" sensation   Statins Other (See Comments)   All of them make me "feel badly"   Sulfonamide Derivatives Hives   Adhesive [tape] Hives, Rash  Maxidex [dexamethasone] Anxiety, Other (See Comments)   Insomnia and dizziness, too      Medication List    TAKE these medications   amoxicillin-clavulanate 500-125 MG tablet Commonly known as:  AUGMENTIN Take 1 tablet (500 mg total) by mouth 3 (three) times daily.   aspirin EC 81 MG tablet Take 1 tablet (81 mg total) by mouth daily.   cetirizine 10 MG tablet Commonly known as:  ZYRTEC Take 10 mg by mouth daily as needed for allergies.   esomeprazole 40 MG capsule Commonly known as:  NEXIUM Take 40 mg by mouth daily.   fluticasone 50 MCG/ACT nasal spray Commonly known as:  FLONASE Place 1 spray into both nostrils daily.   HUMALOG KWIKPEN 100 UNIT/ML KiwkPen Generic drug:  insulin lispro Inject 5-6 Units into the skin See admin instructions. Inject 6 units into the skin with breakfast, 6 units with lunch, and 5 units with supper   HYDROcodone-acetaminophen 7.5-325 MG tablet Commonly known as:  NORCO Take 1 tablet by mouth every 6 (six) hours as needed for moderate pain.   imipramine 25 MG tablet Commonly known as:  TOFRANIL Take 75 mg by mouth at bedtime.   LANTUS SOLOSTAR 100 UNIT/ML Solostar Pen Generic drug:  Insulin Glargine Inject 3 Units into the skin daily with breakfast.   levothyroxine 100 MCG tablet Commonly known as:  SYNTHROID, LEVOTHROID Take 100 mcg by mouth daily before breakfast.   montelukast 10 MG tablet Commonly known as:  SINGULAIR Take 10 mg by mouth at bedtime.   OPCON-A 0.027-0.315 %  Soln Generic drug:  Naphazoline-Pheniramine Place 1-2 drops into both eyes as needed (for allergies).   predniSONE 5 MG tablet Commonly known as:  DELTASONE TAKE 1 TABLET ONCE A DAY WITH BREAKFAST What changed:    how much to take  how to take this  when to take this  additional instructions   triazolam 0.25 MG tablet Commonly known as:  HALCION Take 0.25 mg by mouth at bedtime as needed for sleep.       Follow-up Information    Merrilee Seashore, MD. Schedule an appointment as soon as possible for a visit in 1 week(s).   Specialty:  Internal Medicine Contact information: [address] [place] [address] [phone]        Fay Records, MD .   Specialty:  Cardiology Contact information: [address] [place] [address] [place] [address] [phone]           Major procedures and Radiology Reports - PLEASE review detailed and final reports thoroughly  -       Dg Chest 2 View  Result Date: 07/17/2018 CLINICAL DATA:  Evaluate for pneumonia. EXAM: CHEST - 2 VIEW COMPARISON:  07/05/2018. FINDINGS: Normal heart size. Lungs appear hyperinflated with diffuse coarsened interstitial markings bilaterally. There is been interval resolution of previous pulmonary edema and pleural effusions. Significant improved aeration to both lungs noted compatible with resolving pneumonia. IMPRESSION: 1. Improved aeration of both lungs compatible with resolving pneumonia. 2. Resolution of previous pulmonary edema and pleural effusions. Electronically Signed   By: Kerby Moors M.D.   On: 07/17/2018 17:21   Dg Chest 2 View  Result Date: 07/05/2018 CLINICAL DATA:  Pneumonia follow-up. EXAM: CHEST - 2 VIEW COMPARISON:  Chest x-ray dated June 22, 2018. FINDINGS: The heart size and mediastinal contours are within normal limits. Diffuse interstitial edema is similar to prior study. Improving patchy airspace disease in the right lung. Unchanged small bilateral  pleural effusions. No pneumothorax. No acute osseous abnormality. IMPRESSION: 1. Improving multifocal pneumonia in the right lung. 2. Unchanged mild interstitial pulmonary edema and small bilateral pleural effusions. Electronically Signed   By: Titus Dubin M.D.   On: 07/05/2018 16:24   Ct Head Wo Contrast  Result Date: 07/30/2018 CLINICAL DATA:  Dizziness and fall today.  Initial encounter. EXAM: CT HEAD WITHOUT CONTRAST TECHNIQUE: Contiguous axial images were obtained from the base of the skull through the vertex without intravenous contrast. COMPARISON:  Head CT scan 05/26/2008.  Brain MRI 01/15/2009. FINDINGS: Brain: No evidence of acute infarction, hemorrhage, hydrocephalus, extra-axial collection or mass lesion/mass effect. Mild atrophy noted. Vascular: No hyperdense vessel or unexpected calcification. Skull: Intact.  No focal lesion. Sinuses/Orbits: Status post cataract surgery.  Otherwise negative. Other: None. IMPRESSION: No acute abnormality. Mild atrophy. Electronically Signed   By: Inge Rise M.D.   On: 07/30/2018 16:11   Dg Chest Port 1 View  Result Date: 07/30/2018 CLINICAL DATA:  Dizziness. EXAM: PORTABLE CHEST 1 VIEW COMPARISON:  07/17/2018 06/22/2018.  09/26/2016. FINDINGS: Mediastinum hilar structures normal. Chronic interstitial changes. Right upper lobe infiltrate consistent with pneumonia. No pleural effusion or pneumothorax IMPRESSION: Right upper lobe infiltrate consistent with pneumonia. Electronically Signed   By: Marcello Moores  Register   On: 07/30/2018 10:51   Dg Swallowing Func-speech Pathology  Result Date: 07/31/2018 Objective Swallowing Evaluation: Type of Study: MBS-Modified Barium Swallow Study  Patient Details Name: CALEY CIARAMITARO MRN: [id number] Date of Birth: [birth year] Today's Date: 07/31/2018 Time: SLP Start Time (ACUTE ONLY): 1034 -SLP Stop Time (ACUTE ONLY): 1054 SLP Time Calculation (min) (ACUTE ONLY): 20 min Past Medical History: Past Medical History: Diagnosis  Date . Adenomatous colon polyp  . Arthritis  . Blood transfusion  . Cancer [facility])   cervical cancer pre hysterectomy . Cervical disc disease  . Chronic sinusitis  . CONSTIPATION, CHRONIC 06/26/2007  Qualifier: Diagnosis of  By: Nils Pyle CMA ([place]), Mearl Latin   . COPD (chronic obstructive pulmonary disease) ([place])  . Coronary artery disease   prior MI with stenting of RCA with repeat PTCA/stenting for ISR in 01/2008, stent to LM 11/2008 . CVA (cerebral infarction) 1991 . Depression  . Diabetes mellitus  . Diverticulosis  . 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 Dyslipidemia  . Emphysema  . Fibromyalgia  . GERD (gastroesophageal reflux disease)  . Hypertension  . Hypothyroidism  . IBS (irritable bowel syndrome)  . Legally blind  . Macular degeneration  . Myocardial infarction [facility]) may 27th 2007 . Pneumonia 07/16/2013   HX PNEUMONIA . Pulmonary hypertension ([place])  . Raynaud's phenomenon  . Scleroderma ([place])  . Sjogren's disease ([place])  . Sleep apnea   STOP BANG SCORE 5 . Stroke [facility]) 1990  brain stem stroke - weakness rt hand . Zenker's diverticulum  Past Surgical History: Past Surgical History: Procedure Laterality Date . APPENDECTOMY   . BALLOON DILATION N/A 07/15/2015  Procedure: BALLOON DILATION;  Surgeon: Milus Banister, MD;  Location: [facility];  Service: Endoscopy;  Laterality: N/A; . BILATERAL OOPHORECTOMY  2002 . BRONCHOSCOPY  02-22-09 . CARDIAC CATHETERIZATION    x 3 . CATARACT EXTRACTION   . CHOLECYSTECTOMY  1965 . COLON SURGERY  2014  colon resection . colonectomy   . ESOPHAGOGASTRODUODENOSCOPY (EGD) WITH ESOPHAGEAL DILATION N/A 06/18/2013  Procedure: ESOPHAGOGASTRODUODENOSCOPY (EGD) WITH ESOPHAGEAL DILATION;  Surgeon: Inda Castle, MD;  Location: [facility];  Service: Endoscopy;  Laterality: N/A; . FLEXIBLE SIGMOIDOSCOPY N/A 07/15/2015  Procedure: FLEXIBLE SIGMOIDOSCOPY;  Surgeon: Milus Banister, MD;  Location: [facility];  Service: Endoscopy;  Laterality: N/A; . lobe thyroid removal   . PROCTOSCOPY  03/18/2012   Procedure: PROCTOSCOPY;  Surgeon: Adin Hector, MD;  Location: [facility];  Service: General;  Laterality: N/A;  rigid procto . RADIOLOGY WITH ANESTHESIA N/A 03/09/2015  Procedure: MRI CERVICAL SPINE WITHOUT AND LUMBER WITHOUT;  Surgeon: Medication Radiologist, MD;  Location: [facility];  Service: Radiology;  Laterality: N/A; . [place]  and implantation of a plate  . STENTED CARDIAC  ARTERY  2007 / 2007 / 2010  X 3 STENT . THYROID LOBECTOMY  1991  removal of left lobe thyroid . TOTAL ABDOMINAL HYSTERECTOMY  1973 HPI: [name]  is a 85 y.o. female, history of Zenker's diverticulum, aspiration pneumonia in the past, hypertension, DM type II, dyslipidemia, fibromyalgia, GERD, stroke in the past with mild weakness in the right hand, both thyroidism, Sjogren's syndrome, Scleroderma on low-dose chronic steroids, who was hospitalized in December for aspiration pneumonia and sepsis. Per chart pt states several days ago she probably had a gagging episode while eating food, she woke up this morning feeling feverish having chills, went to the bathroom where she had a near syncopal episode. Found to have right-sided pneumonia with possible sepsis and severe thrombocytopenia. MBS 07/18/13 revealing primary cervical dysphagia with residue, limited UES opening likely due to pressure of cervical spine. Head turn to left and multiple sips cleared stasis.  No data recorded Assessment / Plan / Recommendation CHL IP CLINICAL IMPRESSIONS 07/31/2018 Clinical Impression Pt demonstrated normal neurologically intact swallow function across liquids and solid consistencies. Pt able to manipulate and control bolus to propel to oropharynx timely and efficiently. Swallow initiation was prompt with adequate laryngeal closure, epiglottic deflection, pharyngeal stripping wave that prevented aspiration and problematic penetration. Pharynx was absent of significant residue. Esophagus was briefly viewed (non diagnostic) and observed  previously diagnosed Zenker's diverticulum. If aspiration occurs, given results of MBS, suspect it would be post prandial. At end of study, SLP educated pt on esophageal precautions and pt voiced understanding. No further follow up needed.         SLP Visit Diagnosis Dysphagia, unspecified (R13.10) Attention and concentration deficit following -- Frontal lobe and executive function deficit following -- Impact on safety and function Mild aspiration risk   CHL IP TREATMENT RECOMMENDATION 07/31/2018 Treatment Recommendations No treatment recommended at this time   Prognosis 05/17/2015 Prognosis for Safe Diet Advancement Fair Barriers to Reach Goals Time post onset;Other (Comment) Barriers/Prognosis Comment -- CHL IP DIET RECOMMENDATION 07/31/2018 SLP Diet Recommendations Regular solids;Thin liquid Liquid Administration via Straw;Cup Medication Administration Whole meds with liquid Compensations Follow solids with liquid Postural Changes Remain semi-upright after after feeds/meals (Comment);Seated upright at 90 degrees   CHL IP OTHER RECOMMENDATIONS 07/31/2018 Recommended Consults -- Oral Care Recommendations Oral care BID Other Recommendations --   CHL IP FOLLOW UP RECOMMENDATIONS 07/31/2018 Follow up Recommendations None   CHL IP FREQUENCY AND DURATION 05/17/2015 Speech Therapy Frequency (ACUTE ONLY) min 2x/week Treatment Duration 1 week      CHL IP ORAL PHASE 07/31/2018 Oral Phase WFL Oral - Pudding Teaspoon -- Oral - Pudding Cup -- Oral - Honey Teaspoon -- Oral - Honey Cup -- Oral - Nectar Teaspoon -- Oral - Nectar Cup -- Oral - Nectar Straw -- Oral - Thin Teaspoon -- Oral - Thin Cup -- Oral - Thin Straw -- Oral - Puree -- Oral - Mech Soft -- Oral - Regular -- Oral - Multi-Consistency -- Oral - Pill --  Oral Phase - Comment --  CHL IP PHARYNGEAL PHASE 07/31/2018 Pharyngeal Phase WFL Pharyngeal- Pudding Teaspoon -- Pharyngeal -- Pharyngeal- Pudding Cup -- Pharyngeal -- Pharyngeal- Honey Teaspoon -- Pharyngeal -- Pharyngeal- Honey  Cup -- Pharyngeal -- Pharyngeal- Nectar Teaspoon -- Pharyngeal -- Pharyngeal- Nectar Cup -- Pharyngeal -- Pharyngeal- Nectar Straw -- Pharyngeal -- Pharyngeal- Thin Teaspoon -- Pharyngeal -- Pharyngeal- Thin Cup -- Pharyngeal -- Pharyngeal- Thin Straw -- Pharyngeal -- Pharyngeal- Puree -- Pharyngeal -- Pharyngeal- Mechanical Soft -- Pharyngeal -- Pharyngeal- Regular -- Pharyngeal -- Pharyngeal- Multi-consistency -- Pharyngeal -- Pharyngeal- Pill -- Pharyngeal -- Pharyngeal Comment --  CHL IP CERVICAL ESOPHAGEAL PHASE 07/31/2018 Cervical Esophageal Phase WFL Pudding Teaspoon -- Pudding Cup -- Honey Teaspoon -- Honey Cup -- Nectar Teaspoon -- Nectar Cup -- Nectar Straw -- Thin Teaspoon -- Thin Cup -- Thin Straw -- Puree -- Mechanical Soft -- Regular -- Multi-consistency -- Pill -- Cervical Esophageal Comment -- [place] Siren 07/31/2018, 2:11 PM  [name] Litaker M.Ed Risk analyst [phone] Office [phone]              Micro Results     Recent Results (from the past 240 hour(s))  Blood culture (routine x 2)     Status: None (Preliminary result)   Collection Time: 07/30/18 10:57 AM  Result Value Ref Range Status   Specimen Description BLOOD LEFT FOREARM  Final   Special Requests   Final    BOTTLES DRAWN AEROBIC ONLY Blood Culture results may not be optimal due to an inadequate volume of blood received in culture bottles   Culture   Final    NO GROWTH 2 DAYS Performed at [facility], [place] [address], [place], [place] [address]    Report Status PENDING  Incomplete  Blood culture (routine x 2)     Status: None (Preliminary result)   Collection Time: 07/30/18 11:03 AM  Result Value Ref Range Status   Specimen Description BLOOD RIGHT WRIST  Final   Special Requests   Final    BOTTLES DRAWN AEROBIC AND ANAEROBIC Blood Culture results may not be optimal due to an inadequate volume of blood received in culture bottles   Culture   Final    NO GROWTH 2  DAYS Performed at [facility], [place] [address], [place], [place] [address]    Report Status PENDING  Incomplete  Urine culture     Status: None   Collection Time: 07/30/18  3:39 PM  Result Value Ref Range Status   Specimen Description URINE, CATHETERIZED  Final   Special Requests NONE  Final   Culture   Final    NO GROWTH Performed at [facility], [address], [place], [place] [address]    Report Status 07/31/2018 FINAL  Final    Today   Subjective    [name] today has no headache,no chest abdominal pain,no new weakness tingling or numbness, feels much better wants to go home today.     Objective   Blood pressure (!) 153/58, pulse 90, temperature 98.8 F (37.1 C), temperature source Oral, resp. rate 18, height 5\' 2"  (1.575 m), weight 57.2 kg, SpO2 90 %.   Intake/Output Summary (Last 24 hours) at 08/01/2018 1043 Last data filed at 07/31/2018 1400 Gross per 24 hour  Intake 50 ml  Output 600 ml  Net -550 ml    Exam  Awake Alert, Oriented x 3, No new F.N deficits, Normal affect [place].AT,PERRAL Supple Neck,No JVD,  No cervical lymphadenopathy appriciated.  Symmetrical Chest wall movement, Good air movement bilaterally, CTAB RRR,No Gallops,Rubs or new Murmurs, No Parasternal Heave +ve B.Sounds, Abd Soft, Non tender, No organomegaly appriciated, No rebound -guarding or rigidity. No Cyanosis, Clubbing or edema, No new Rash or bruise   Data Review   CBC w Diff:  Lab Results  Component Value Date   WBC 12.0 (H) 08/01/2018   HGB 9.3 (L) 08/01/2018   HGB 10.8 (L) 10/31/2016   HCT 28.7 (L) 08/01/2018   HCT 34.1 10/31/2016   PLT 169 08/01/2018   PLT 363 10/31/2016   LYMPHOPCT 19.0 07/17/2018   MONOPCT 4.9 07/17/2018   EOSPCT 2.3 07/17/2018   BASOPCT 1.5 07/17/2018    CMP:  Lab Results  Component Value Date   NA 135 08/01/2018   NA 135 09/22/2016   K 3.2 (L) 08/01/2018   CL 103 08/01/2018   CO2 25 08/01/2018   BUN 10 08/01/2018   BUN 33 (H)  09/22/2016   CREATININE 0.65 08/01/2018   CREATININE 0.80 03/30/2016   PROT 5.3 (L) 08/01/2018   ALBUMIN 2.5 (L) 08/01/2018   BILITOT 0.6 08/01/2018   ALKPHOS 51 08/01/2018   AST 20 08/01/2018   ALT 24 08/01/2018  .   Total Time in preparing paper work, data evaluation and todays exam - 49 minutes  Lala Lund M.D on 08/01/2018 at 10:43 AM  [facility]   Office  [phone]

## 2018-08-01 NOTE — Discharge Instructions (Signed)
Follow with Primary MD Merrilee Seashore, MD in 7 days   Get CBC, CMP, 2 view Chest X ray -  checked  by Primary MD   in 5-7 days    Activity: As tolerated with Full fall precautions use walker/cane & assistance as needed  Disposition Home    Diet: Soft diet with aspiration precautions.  Special Instructions: If you have smoked or chewed Tobacco  in the last 2 yrs please stop smoking, stop any regular Alcohol  and or any Recreational drug use.  On your next visit with your primary care physician please Get Medicines reviewed and adjusted.  Please request your Prim.MD to go over all Hospital Tests and Procedure/Radiological results at the follow up, please get all Hospital records sent to your Prim MD by signing hospital release before you go home.  If you experience worsening of your admission symptoms, develop shortness of breath, life threatening emergency, suicidal or homicidal thoughts you must seek medical attention immediately by calling 911 or calling your MD immediately  if symptoms less severe.  You Must read complete instructions/literature along with all the possible adverse reactions/side effects for all the Medicines you take and that have been prescribed to you. Take any new Medicines after you have completely understood and accpet all the possible adverse reactions/side effects.

## 2018-08-01 NOTE — Evaluation (Signed)
Physical Therapy Treatment Patient Details Name: [name] MRN: [id number] DOB: 1931/03/19 Today's Date: 08/01/2018    History of Present Illness Pt is an 85 y/o female admitted secondary to dizziness. Pt found to have PNA and possibly sepsis. PMH including but not limited to DM, HTN, COPD, CAD, CVA, macular degeneration.    PT Comments    Patient received in bed, reports she is feeling shaky and freezing this morning. Thinks it is medications they gave her yesterday. Patient demonstrates decreased activity tolerance this visit due to feeling bad. Ambulated 60 feet and requested to return to room. Patient will benefit from continued skilled PT to address her functional limitations, weakness and difficulty walking for return home at discharge.     Follow Up Recommendations  Home health PT     Equipment Recommendations  None recommended by PT    Recommendations for Other Services       Precautions / Restrictions Precautions Precautions: Fall Restrictions Weight Bearing Restrictions: No    Mobility  Bed Mobility Overal bed mobility: Needs Assistance Bed Mobility: Supine to Sit;Sit to Supine       Sit to supine: Min guard   General bed mobility comments: supervision for safety  Transfers Overall transfer level: Needs assistance Equipment used: None Transfers: Sit to/from Stand Sit to Stand: Min guard         General transfer comment: Patient reports she is feeling shaky today, min guard for safety  Ambulation/Gait Ambulation/Gait assistance: Min guard;Supervision Gait Distance (Feet): 60 Feet Assistive device: None Gait Pattern/deviations: Step-to pattern;Decreased stride length;Shuffle Gait velocity: decreased   General Gait Details: Patient requested to go to bathroom first, independent with toileting tasks and balance in bathroom, however using grab bar to assist with transfers. Fairly steady with ambulation however demonstrating increased weakness this  visit. Decreased ambulation tolerance, Leaning on counter once inside room.     Stairs             Wheelchair Mobility    Modified Rankin (Stroke Patients Only)       Balance Overall balance assessment: Mild deficits observed, not formally tested;Modified Independent Sitting-balance support: Feet supported Sitting balance-Leahy Scale: Good     Standing balance support: No upper extremity supported Standing balance-Leahy Scale: Fair                              Cognition Arousal/Alertness: Awake/alert Behavior During Therapy: WFL for tasks assessed/performed Overall Cognitive Status: Within Functional Limits for tasks assessed                                 General Comments: patient a bit distraught, reports she is freezing, feeling shaky, feels like she may have a fever.       Exercises      General Comments        Pertinent Vitals/Pain Pain Assessment: No/denies pain    Home Living Family/patient expects to be discharged to:: Private residence Living Arrangements: Alone Available Help at Discharge: Family;Friend(s);Available PRN/intermittently Type of Home: House Home Access: Stairs to enter   Home Layout: Two level;Able to live on main level with bedroom/bathroom Home Equipment: Kasandra Knudsen - single point;Walker - 4 wheels Additional Comments: stays on first floor    Prior Function Level of Independence: Independent with assistive device(s)      Comments: was using a cane PRN when outside on uneven  surfaces; pt was receiving HHPT 1-2x/week prior to admission   PT Goals (current goals can now be found in the care plan section)      Frequency    Min 3X/week      PT Plan      Co-evaluation              AM-PAC PT "6 Clicks" Mobility   Outcome Measure  Help needed turning from your back to your side while in a flat bed without using bedrails?: None Help needed moving from lying on your back to sitting on the side  of a flat bed without using bedrails?: A Little Help needed moving to and from a bed to a chair (including a wheelchair)?: A Little Help needed standing up from a chair using your arms (e.g., wheelchair or bedside chair)?: A Little Help needed to walk in hospital room?: A Little Help needed climbing 3-5 steps with a railing? : A Little 6 Click Score: 19    End of Session Equipment Utilized During Treatment: Gait belt Activity Tolerance: Patient limited by fatigue Patient left: in bed;with bed alarm set;with call bell/phone within reach Nurse Communication: Mobility status PT Visit Diagnosis: Muscle weakness (generalized) (M62.81);Difficulty in walking, not elsewhere classified (R26.2)     Time: 1130-1145 PT Time Calculation (min) (ACUTE ONLY): 15 min  Charges:  $Therapeutic Activity: 8-22 mins                     Eshaal Duby, PT, GCS 08/01/18,12:02 PM

## 2018-08-02 LAB — COMPREHENSIVE METABOLIC PANEL
ALT: 21 U/L (ref 0–44)
AST: 16 U/L (ref 15–41)
Albumin: 2.6 g/dL — ABNORMAL LOW (ref 3.5–5.0)
Alkaline Phosphatase: 63 U/L (ref 38–126)
Anion gap: 9 (ref 5–15)
BUN: 7 mg/dL — ABNORMAL LOW (ref 8–23)
CO2: 24 mmol/L (ref 22–32)
Calcium: 8 mg/dL — ABNORMAL LOW (ref 8.9–10.3)
Chloride: 102 mmol/L (ref 98–111)
Creatinine, Ser: 0.59 mg/dL (ref 0.44–1.00)
GFR calc Af Amer: >60 mL/min (ref >60)
GFR calc non Af Amer: >60 mL/min (ref >60)
Glucose, Bld: 190 mg/dL — ABNORMAL HIGH (ref 70–99)
Potassium: 3.3 mmol/L — ABNORMAL LOW (ref 3.5–5.1)
Sodium: 135 mmol/L (ref 135–145)
Total Bilirubin: 0.9 mg/dL (ref 0.3–1.2)
Total Protein: 6.6 g/dL (ref 6.5–8.1)

## 2018-08-02 LAB — CBC
HCT: 33.6 % — ABNORMAL LOW (ref 36.0–46.0)
Hemoglobin: 10.8 g/dL — ABNORMAL LOW (ref 12.0–15.0)
MCH: 26.2 pg (ref 26.0–34.0)
MCHC: 32.1 g/dL (ref 30.0–36.0)
MCV: 81.6 fL (ref 80.0–100.0)
Platelets: 229 10*3/uL (ref 150–400)
RBC: 4.12 MIL/uL (ref 3.87–5.11)
RDW: 14.7 % (ref 11.5–15.5)
WBC: 9.8 10*3/uL (ref 4.0–10.5)
nRBC: 0 % (ref 0.0–0.2)

## 2018-08-02 LAB — GLUCOSE, CAPILLARY
Glucose-Capillary: 130 mg/dL — ABNORMAL HIGH (ref 70–99)
Glucose-Capillary: 220 mg/dL — ABNORMAL HIGH (ref 70–99)
Glucose-Capillary: 204 mg/dL — ABNORMAL HIGH (ref 70–99)
Glucose-Capillary: 185 mg/dL — ABNORMAL HIGH (ref 70–99)

## 2018-08-02 MED ORDER — MAGNESIUM SULFATE IN D5W 1-5 GM/100ML-% IV SOLN
1.0000 g | Freq: Once | INTRAVENOUS | Status: AC
  Administered 2018-08-02: 1 g via INTRAVENOUS
  Filled 2018-08-02: qty 100

## 2018-08-02 MED ORDER — INSULIN LISPRO (1 UNIT DIAL) 100 UNIT/ML (KWIKPEN)
5.0000 [IU] | PEN_INJECTOR | Freq: Every day | SUBCUTANEOUS | Status: DC
  Administered 2018-08-02: 5 [IU] via SUBCUTANEOUS
  Filled 2018-08-02 (×3): qty 3

## 2018-08-02 MED ORDER — NON FORMULARY: 5.0000 [IU] | Status: DC

## 2018-08-02 MED ORDER — POTASSIUM CHLORIDE CRYS ER 20 MEQ PO TBCR
40.0000 meq | EXTENDED_RELEASE_TABLET | Freq: Once | ORAL | Status: AC
  Administered 2018-08-02: 40 meq via ORAL
  Filled 2018-08-02: qty 2

## 2018-08-02 MED ORDER — INSULIN LISPRO (1 UNIT DIAL) 100 UNIT/ML (KWIKPEN)
6.0000 [IU] | PEN_INJECTOR | Freq: Two times a day (BID) | SUBCUTANEOUS | Status: DC
  Filled 2018-08-02: qty 3

## 2018-08-02 NOTE — Care Management (Signed)
Medicare Appeal  DC order written 08/02/2018 Verified through [facility] appeal has beedn filed.  Spoke with patient who filed claim. Reviewed VAPO14 and DND with patient. Megan faxed original copies to [facility].  Will await [employer] decision on appeal.

## 2018-08-02 NOTE — Progress Notes (Signed)
[facility]                                                                                                                                                                         Patient Demographics  [name], is a 85 y.o. female  FKC:[id number]  VCB:[id number]  DOB - [birth year]  Admit date - 07/30/2018  Admitting Physician Renee Lose, MD  Outpatient Primary MD for the patient is Renee Seashore, MD  LOS - 3   Chief Complaint  Patient presents with  . Fall  . Dizziness        Assessment & Plan    Patient seen briefly today due for discharge soon per Discharge done yesterday by me, no further issues, Vital signs stable, patient feels fine.  Be discharged today per yesterday's discharge summary, will be given a trial of ambulatory pulse ox monitoring and if qualifies will get home oxygen for at least a few days till her lungs recover if she qualifies.  She is symptom-free at this point.    Medications  Scheduled Meds: . fluticasone  1 spray Each Nare Daily  . folic acid  1 mg Oral Daily  . hydrocortisone sod succinate (SOLU-CORTEF) inj  50 mg Intravenous Q12H  . insulin aspart  0-5 Units Subcutaneous QHS  . insulin aspart  0-9 Units Subcutaneous TID WC  . levothyroxine  100 mcg Oral QAC breakfast  . pantoprazole  40 mg Oral Daily  . potassium chloride  40 mEq Oral Once   Continuous Infusions: . ampicillin-sulbactam (UNASYN) IV 3 g (08/02/18 0550)  . magnesium sulfate 1 - 4 g bolus IVPB     PRN Meds:.acetaminophen **OR** [DISCONTINUED] acetaminophen, docusate sodium, triazolam    Time Spent in minutes   10 minutes   [name] M.D on 08/02/2018 at 8:25 AM  Between 7am to 7pm - Pager - [phone]  After 7pm go to [email or web address] - password [email or web address]  And look for the night coverage person covering for me after hours  [facility] Office  [phone]     Subjective:   [name] today has, No headache, No chest pain, No abdominal pain - No Nausea, No new weakness tingling or numbness, No Cough - SOB.   Objective:   Vitals:   08/01/18 1325 08/01/18 1445 08/01/18 2146 08/02/18 0502  BP:  (!) 168/64 (!) 148/59 (!) 150/54  Pulse:  (!) 109 87 79  Resp:  16  16  Temp: (!) 100.6 F (38.1 C) 100.1 F (37.8 C) 98.8 F (37.1 C) 98.4 F (36.9 C)  TempSrc: Oral Oral Oral Oral  SpO2: 96% 95% 95% 96%  Weight:      Height:  Wt Readings from Last 3 Encounters:  07/30/18 57.2 kg  07/17/18 59.4 kg  07/12/18 59.3 kg    No intake or output data in the 24 hours ending 08/02/18 0825  Exam Awake Alert, Oriented X 3, No new F.N deficits, Normal affect [place].AT,PERRAL Supple Neck,No JVD, No cervical lymphadenopathy appriciated.  Symmetrical Chest wall movement, Good air movement bilaterally, CTAB RRR,No Gallops,Rubs or new Murmurs, No Parasternal Heave +ve B.Sounds, Abd Soft, Non tender, No organomegaly appriciated, No rebound - guarding or rigidity. No Cyanosis, Clubbing or edema, No new Rash or bruise      Data Review

## 2018-08-02 NOTE — Progress Notes (Addendum)
Physical Therapy Treatment Patient Details Name: [name] MRN: [id number] DOB: 12-14-1930 Today's Date: 08/02/2018    History of Present Illness Pt is an 85 y/o female admitted secondary to dizziness. Pt found to have PNA and possibly sepsis. PMH including but not limited to DM, HTN, COPD, CAD, CVA, macular degeneration.    PT Comments    Pt making progress since previous session. She tolerated gait training with and without use of an AD. Discussed initially having someone present with her 24/7 once home until she felt more comfortable with mobility. Pt planning to call her daughter to discuss with her as well. Pt would continue to benefit from skilled physical therapy services at this time while admitted and after d/c to address the below listed limitations in order to improve overall safety and independence with functional mobility.  Of note, pt ambulated on RA with SPO2 maintaining at 88-91%. Pt asymptomatic throughout.     Follow Up Recommendations  Home health PT;Supervision for mobility/OOB     Equipment Recommendations  None recommended by PT    Recommendations for Other Services       Precautions / Restrictions Precautions Precautions: Fall Restrictions Weight Bearing Restrictions: No    Mobility  Bed Mobility Overal bed mobility: Needs Assistance Bed Mobility: Supine to Sit     Supine to sit: Supervision     General bed mobility comments: supervision for safety  Transfers Overall transfer level: Needs assistance Equipment used: None Transfers: Sit to/from Stand Sit to Stand: Min guard         General transfer comment: min guard for safety to stand from EOB  Ambulation/Gait Ambulation/Gait assistance: Min guard Gait Distance (Feet): 100 Feet(50' x1 with IV pole and 1HHA, 22' x1 with no UE supports) Assistive device: IV Pole;1 person hand held assist;None Gait Pattern/deviations: Step-through pattern;Decreased step length - right;Decreased step  length - left;Decreased stride length Gait velocity: decreased   General Gait Details: pt ambulated 64' x1 with IV pole and 1HHA, 67' x1 with no UE supports; pt with mild instability but no overt LOB or need for physical assistance, min guard for safety   Stairs             Wheelchair Mobility    Modified Rankin (Stroke Patients Only)       Balance Overall balance assessment: Needs assistance Sitting-balance support: Feet supported Sitting balance-Leahy Scale: Good     Standing balance support: No upper extremity supported Standing balance-Leahy Scale: Fair                              Cognition Arousal/Alertness: Awake/alert Behavior During Therapy: WFL for tasks assessed/performed Overall Cognitive Status: Within Functional Limits for tasks assessed                                        Exercises      General Comments        Pertinent Vitals/Pain Pain Assessment: No/denies pain    Home Living                      Prior Function            PT Goals (current goals can now be found in the care plan section) Acute Rehab PT Goals PT Goal Formulation: With patient Time For Goal Achievement: 08/14/18  Potential to Achieve Goals: Good Progress towards PT goals: Progressing toward goals    Frequency    Min 3X/week      PT Plan Current plan remains appropriate    Co-evaluation              AM-PAC PT "6 Clicks" Mobility   Outcome Measure  Help needed turning from your back to your side while in a flat bed without using bedrails?: None Help needed moving from lying on your back to sitting on the side of a flat bed without using bedrails?: None Help needed moving to and from a bed to a chair (including a wheelchair)?: A Little Help needed standing up from a chair using your arms (e.g., wheelchair or bedside chair)?: None Help needed to walk in hospital room?: A Little Help needed climbing 3-5 steps with a  railing? : A Lot 6 Click Score: 20    End of Session Equipment Utilized During Treatment: Gait belt Activity Tolerance: Patient tolerated treatment well Patient left: in chair;with call bell/phone within reach Nurse Communication: Mobility status PT Visit Diagnosis: Muscle weakness (generalized) (M62.81);Difficulty in walking, not elsewhere classified (R26.2)     Time: 1010-1030 PT Time Calculation (min) (ACUTE ONLY): 20 min  Charges:  $Gait Training: 8-22 mins                     Sherie Don, [place], DPT  Acute Rehabilitation Services Pager [phone] Office [place] 08/02/2018, 10:45 AM

## 2018-08-02 NOTE — Care Management Note (Addendum)
Case Management Note  Patient Details  Name: [name] MRN: [id number] Date of Birth: [birth year]  Subjective/Objective:  85 yo female presented with dizziness, found to have PNA and possibly sepsis. PMH:  DM, HTN, COPD, CAD, CVA, macular degeneration.                 Action/Plan: CM met with patient to discuss dispositional needs. Patient states living at home alone, with her daughter/son 2 minutes away in the same condo and being independent with ADLs PTA and open to [facility] for [facility] services. DME: RW and SPC. PCP verified as: Dr. Merrilee Seashore; pharmacy of choice: [facility]. PT eval complete with HHPT recommended with an HHRN/ST added by Dr. Candiss Norse. CMS [facility] Compare list provided with patient requesting to continue with [facility]. [place] referral discussed with Adonis Brook, [facility] liaison; AVS updated. Patient indicated he daughter would provide transportation home. No further needs from CM.   Expected Discharge Date:  08/02/18               Expected Discharge Plan:  [place]  In-House Referral:  [facility]  Discharge planning Services  CM Consult  Post Acute Care Choice:  Home Health Choice offered to:  Patient  DME Arranged:  N/A DME Agency:  NA  HH Arranged:  RN, Disease Management, PT, Speech Therapy [place] Agency:  [place]  Status of Service:  Completed, signed off  If discussed at [place] of Stay Meetings, dates discussed:    Additional Comments: 08/02/18 @ 1414-Erna Brossard RNCM-CM informed by patients primary floor nurse of patients daughter concerns that patient will not have 24/7 assistance post transition; daughter stating she has to work throughout the weekend. CM met with patient to discuss [place] and the agencies ability to provide the assistance recommended to ensure patient is safe, with patient verbalizing understanding. [place] [facility] referral discussed with Enrique Sack, [facility] liaison; AVS updated. CM contacted Butch Penny RN, [facility] liaison, to  inform of patients [facility] agency change to [facility]. No further needs from CM.    Midge Minium RN, BSN, NCM-BC, ACM-RN [phone] 08/02/2018, 11:53 AM

## 2018-08-02 NOTE — Care Management Important Message (Signed)
Important Message  Patient Details  Name: [name] MRN: [id number] Date of Birth: 1931/05/15   Medicare Important Message Given:  Yes    [name] 08/02/2018, 3:57 PM

## 2018-08-02 NOTE — Consult Note (Signed)
   [facility] CM Inpatient Consult   08/02/2018  [name] [birth year] [id number]  We have reviewed your referral request and patient is currently active in the [facility] home based palliative care program with [facility].  The patient's community care management needs will be followed with that program.  Confirmed that the patient is still active with [facility] at [facility].  Thank you for the referral.  For any questions:  Natividad Brood, RN BSN [facility] Liaison  [phone] business mobile phone Toll free office [phone]

## 2018-08-03 LAB — TYPE AND SCREEN
ABO/RH(D): B POS
Antibody Screen: NEGATIVE
Donor AG Type: NEGATIVE
Donor AG Type: NEGATIVE
Unit division: 0
Unit division: 0

## 2018-08-03 LAB — CBC
HCT: 31.1 % — ABNORMAL LOW (ref 36.0–46.0)
Hemoglobin: 10 g/dL — ABNORMAL LOW (ref 12.0–15.0)
MCH: 26 pg (ref 26.0–34.0)
MCHC: 32.2 g/dL (ref 30.0–36.0)
MCV: 81 fL (ref 80.0–100.0)
Platelets: 231 10*3/uL (ref 150–400)
RBC: 3.84 MIL/uL — ABNORMAL LOW (ref 3.87–5.11)
RDW: 14.6 % (ref 11.5–15.5)
WBC: 7.4 10*3/uL (ref 4.0–10.5)
nRBC: 0 % (ref 0.0–0.2)

## 2018-08-03 LAB — COMPREHENSIVE METABOLIC PANEL
ALT: 18 U/L (ref 0–44)
AST: 13 U/L — ABNORMAL LOW (ref 15–41)
Albumin: 2.4 g/dL — ABNORMAL LOW (ref 3.5–5.0)
Alkaline Phosphatase: 63 U/L (ref 38–126)
Anion gap: 14 (ref 5–15)
BUN: 10 mg/dL (ref 8–23)
CO2: 22 mmol/L (ref 22–32)
Calcium: 8 mg/dL — ABNORMAL LOW (ref 8.9–10.3)
Chloride: 101 mmol/L (ref 98–111)
Creatinine, Ser: 0.69 mg/dL (ref 0.44–1.00)
GFR calc Af Amer: >60 mL/min (ref >60)
GFR calc non Af Amer: >60 mL/min (ref >60)
Glucose, Bld: 215 mg/dL — ABNORMAL HIGH (ref 70–99)
Potassium: 3.4 mmol/L — ABNORMAL LOW (ref 3.5–5.1)
Sodium: 137 mmol/L (ref 135–145)
Total Bilirubin: 0.3 mg/dL (ref 0.3–1.2)
Total Protein: 5.8 g/dL — ABNORMAL LOW (ref 6.5–8.1)

## 2018-08-03 LAB — MAGNESIUM: Magnesium: 1.8 mg/dL (ref 1.7–2.4)

## 2018-08-03 LAB — BPAM RBC
Blood Product Expiration Date: 202002152359
Blood Product Expiration Date: 202002242359
Unit Type and Rh: 5100
Unit Type and Rh: 5100

## 2018-08-03 LAB — GLUCOSE, CAPILLARY: Glucose-Capillary: 163 mg/dL — ABNORMAL HIGH (ref 70–99)

## 2018-08-03 MED ORDER — POTASSIUM CHLORIDE CRYS ER 20 MEQ PO TBCR
40.0000 meq | EXTENDED_RELEASE_TABLET | Freq: Once | ORAL | Status: AC
  Administered 2018-08-03: 40 meq via ORAL
  Filled 2018-08-03: qty 2

## 2018-08-03 NOTE — Plan of Care (Signed)
Nsg Discharge Note  Admit Date:  07/30/2018 Discharge date: 08/03/2018   CALANDRA MADURA to be D/C'd Home per MD order.  AVS completed.  Copy for chart, and copy for patient signed, and dated. Patient/caregiver able to verbalize understanding.  Discharge Medication: Allergies as of 08/03/2018       Reactions   Peanut Oil Anaphylaxis, Swelling, Rash, Other (See Comments)   Throat tightens, also   Sulfa Antibiotics Anaphylaxis      Titanium Other (See Comments)   Neck wouldn't heal with titanium cervical plate   Yellow Dye Other (See Comments)   "Bee stings" all over body when eating/taking medications with red or yellow dye    Lantus [insulin Glargine] Other (See Comments)   Patient states "sulfate binder causes sinus infection/pneumonia".   Levaquin [levofloxacin In D5w] Other (See Comments)   Muscle aches   Metoprolol-hydrochlorothiazide Other (See Comments)   Decreased B/P   Norvasc [amlodipine] Swelling, Other (See Comments)   BIL Ankle edema   Novolog [insulin Aspart]    Patient states "sulfate binder causes sinus infection/pneumonia". Tolerates Humalog.   Penciclovir Other (See Comments)   Muscle Pain   Senna [sennosides] Other (See Comments)   Pt states that it causes a "stinging" sensation   Statins Other (See Comments)   All of them make me "feel badly"   Sulfonamide Derivatives Hives   Adhesive [tape] Hives, Rash   Maxidex [dexamethasone] Anxiety, Other (See Comments)   Insomnia and dizziness, too        Medication List     TAKE these medications    amoxicillin-clavulanate 500-125 MG tablet Commonly known as:  AUGMENTIN Take 1 tablet (500 mg total) by mouth 3 (three) times daily.   aspirin EC 81 MG tablet Take 1 tablet (81 mg total) by mouth daily.   cetirizine 10 MG tablet Commonly known as:  ZYRTEC Take 10 mg by mouth daily as needed for allergies.   esomeprazole 40 MG capsule Commonly known as:  NEXIUM Take 40 mg by mouth daily.   fluticasone 50  MCG/ACT nasal spray Commonly known as:  FLONASE Place 1 spray into both nostrils daily.   HUMALOG KWIKPEN 100 UNIT/ML KwikPen Generic drug:  insulin lispro Inject 5-6 Units into the skin See admin instructions. Inject 6 units into the skin with breakfast, 6 units with lunch, and 5 units with supper   HYDROcodone-acetaminophen 7.5-325 MG tablet Commonly known as:  NORCO Take 1 tablet by mouth every 6 (six) hours as needed for moderate pain.   imipramine 25 MG tablet Commonly known as:  TOFRANIL Take 75 mg by mouth at bedtime.   LANTUS SOLOSTAR 100 UNIT/ML Solostar Pen Generic drug:  Insulin Glargine Inject 3 Units into the skin daily with breakfast.   levothyroxine 100 MCG tablet Commonly known as:  SYNTHROID, LEVOTHROID Take 100 mcg by mouth daily before breakfast.   montelukast 10 MG tablet Commonly known as:  SINGULAIR Take 10 mg by mouth at bedtime.   OPCON-A 0.027-0.315 % Soln Generic drug:  Naphazoline-Pheniramine Place 1-2 drops into both eyes as needed (for allergies).   predniSONE 5 MG tablet Commonly known as:  DELTASONE TAKE 1 TABLET ONCE A DAY WITH BREAKFAST What changed:   how much to take how to take this when to take this additional instructions   triazolam 0.25 MG tablet Commonly known as:  HALCION Take 0.25 mg by mouth at bedtime as needed for sleep.        Discharge Assessment: Vitals:  08/02/18 2026 08/03/18 0655  BP: (!) 158/69 (!) 160/64  Pulse: 78 86  Resp: 16 16  Temp: 98.2 F (36.8 C) 98.8 F (37.1 C)  SpO2: 99% 95%   Skin clean, dry and intact without evidence of skin break down, no evidence of skin tears noted. IV catheter discontinued intact. Site without signs and symptoms of complications - no redness or edema noted at insertion site, patient denies c/o pain - only slight tenderness at site.  Dressing with slight pressure applied.  D/c Instructions-Education: Discharge instructions given to patient/family with verbalized  understanding. D/c education completed with patient/family including follow up instructions, medication list, d/c activities limitations if indicated, with other d/c instructions as indicated by MD - patient able to verbalize understanding, all questions fully answered. Patient instructed to return to ED, call 911, or call MD for any changes in condition.  Patient escorted via [place], and D/C home via private auto.  Salley Slaughter, RN 08/03/2018 12:58 PM

## 2018-08-03 NOTE — Progress Notes (Signed)
[facility]                                                                                                                                                                         Patient Demographics  [name], is a 85 y.o. female  LNL:[id number]  EYC:[id number]  DOB - August 30, 1930  Admit date - 07/30/2018  Admitting Physician Thurnell Lose, MD  Outpatient Primary MD for the patient is Merrilee Seashore, MD  LOS - 4   Chief Complaint  Patient presents with  . Fall  . Dizziness        Assessment & Plan    Patient seen briefly today due for discharge soon per Discharge done yesterday by me 2 days ago, patient has remained symptom-free, daughter apprehensive taking her home as daughter works, home PT and health was already arranged, patient did well with PT here and did not qualify for SNF.  Daughter wanted help with prolonged care at home, agency information was provided by case management to the daughter to arrange for extra help if needed, patient remained symptom-free remains discharged will be taken home once family takes her, all paperwork has been completed 2 days ago.    Medications  Scheduled Meds: . fluticasone  1 spray Each Nare Daily  . folic acid  1 mg Oral Daily  . hydrocortisone sod succinate (SOLU-CORTEF) inj  50 mg Intravenous Q12H  . insulin lispro  5 Units Subcutaneous Q supper  . insulin lispro  6 Units Subcutaneous BID WC  . levothyroxine  100 mcg Oral QAC breakfast  . pantoprazole  40 mg Oral Daily  . potassium chloride  40 mEq Oral Once   Continuous Infusions: . ampicillin-sulbactam (UNASYN) IV 3 g (08/03/18 0649)   PRN Meds:.acetaminophen **OR** [DISCONTINUED] acetaminophen, docusate sodium, triazolam    Time Spent in minutes   10 minutes   Lala Lund M.D on 08/03/2018 at 9:03 AM  Between 7am to 7pm - Pager - [phone]  After 7pm go to [email or web address]  - password [email or web address]  And look for the night coverage person covering for me after hours  [facility] Office  [phone]    Subjective:   Alissa Pharr today has, No headache, No chest pain, No abdominal pain - No Nausea, No new weakness tingling or numbness, No Cough - SOB.   Objective:   Vitals:   08/02/18 0502 08/02/18 1344 08/02/18 2026 08/03/18 0655  BP: (!) 150/54 (!) 144/57 (!) 158/69 (!) 160/64  Pulse: 79 81 78 86  Resp: 16 18 16 16   Temp: 98.4 F (36.9 C) 98.9 F (37.2 C) 98.2 F (36.8 C) 98.8 F (37.1 C)  TempSrc: Oral Oral Oral   SpO2: 96% 96% 99%  95%  Weight:      Height:        Wt Readings from Last 3 Encounters:  07/30/18 57.2 kg  07/17/18 59.4 kg  07/12/18 59.3 kg    No intake or output data in the 24 hours ending 08/03/18 0903  Exam Awake Alert, Oriented X 3, No new F.N deficits, Normal affect [place].AT,PERRAL Supple Neck,No JVD, No cervical lymphadenopathy appriciated.  Symmetrical Chest wall movement, Good air movement bilaterally, CTAB RRR,No Gallops,Rubs or new Murmurs, No Parasternal Heave +ve B.Sounds, Abd Soft, Non tender, No organomegaly appriciated, No rebound - guarding or rigidity. No Cyanosis, Clubbing or edema, No new Rash or bruise      Data Review

## 2018-08-03 NOTE — Progress Notes (Addendum)
NCM checked faxed in Social workers office and in Case Management office, nothing was on the fax from [place] yet.   12:00 Tomi Bamberger RN, BSN - NCM checked again, nothing from [place] yet.  12:06 Tomi Bamberger RN, BSN - This NCM informed by RN that patient is leaving today, NCM informed RN to inform patient she will need to call [employer] to cancel appeal before she leaves.  This NCM informed daughter that she will need to call Judithann Graves to cancel appeal, she states she did not have time to call them right now, she will call them when she gets time.

## 2018-08-04 LAB — CULTURE, BLOOD (ROUTINE X 2)
Culture: NO GROWTH
Culture: NO GROWTH

## 2018-08-05 ENCOUNTER — Ambulatory Visit: Payer: Medicare Other | Admitting: Acute Care

## 2018-08-07 ENCOUNTER — Ambulatory Visit (INDEPENDENT_AMBULATORY_CARE_PROVIDER_SITE_OTHER): Payer: Medicare Other | Admitting: Acute Care

## 2018-08-07 ENCOUNTER — Encounter: Payer: Self-pay | Admitting: Acute Care

## 2018-08-07 ENCOUNTER — Ambulatory Visit (INDEPENDENT_AMBULATORY_CARE_PROVIDER_SITE_OTHER)
Admission: RE | Admit: 2018-08-07 | Discharge: 2018-08-07 | Disposition: A | Discharge status: Home / Self Care | Payer: Medicare Other | Source: Ambulatory Visit | Attending: Acute Care | Admitting: Acute Care

## 2018-08-07 VITALS — BP 126/64 | HR 82 | Temp 98.4°F | Ht 62.0 in | Wt 132.8 lb

## 2018-08-07 DIAGNOSIS — J189 Pneumonia, unspecified organism: Secondary | ICD-10-CM

## 2018-08-07 DIAGNOSIS — R0902 Hypoxemia: Secondary | ICD-10-CM | POA: Diagnosis not present

## 2018-08-07 DIAGNOSIS — J329 Chronic sinusitis, unspecified: Secondary | ICD-10-CM | POA: Diagnosis not present

## 2018-08-07 DIAGNOSIS — D696 Thrombocytopenia, unspecified: Secondary | ICD-10-CM | POA: Diagnosis not present

## 2018-08-07 DIAGNOSIS — J690 Pneumonitis due to inhalation of food and vomit: Secondary | ICD-10-CM

## 2018-08-07 DIAGNOSIS — I2510 Atherosclerotic heart disease of native coronary artery without angina pectoris: Secondary | ICD-10-CM | POA: Diagnosis not present

## 2018-08-07 LAB — CBC WITH DIFFERENTIAL/PLATELET
Basophils Absolute: 0.1 10*3/uL (ref 0.0–0.1)
Basophils Relative: 0.7 % (ref 0.0–3.0)
Eosinophils Absolute: 0.2 10*3/uL (ref 0.0–0.7)
Eosinophils Relative: 1.9 % (ref 0.0–5.0)
HCT: 33.7 % — ABNORMAL LOW (ref 36.0–46.0)
Hemoglobin: 10.9 g/dL — ABNORMAL LOW (ref 12.0–15.0)
Lymphocytes Relative: 17.8 % (ref 12.0–46.0)
Lymphs Abs: 2 10*3/uL (ref 0.7–4.0)
MCHC: 32.4 g/dL (ref 30.0–36.0)
MCV: 79.7 fl (ref 78.0–100.0)
Monocytes Absolute: 0.9 10*3/uL (ref 0.1–1.0)
Monocytes Relative: 7.6 % (ref 3.0–12.0)
Neutro Abs: 8.1 10*3/uL — ABNORMAL HIGH (ref 1.4–7.7)
Neutrophils Relative %: 72 % (ref 43.0–77.0)
Platelets: 331 10*3/uL (ref 150.0–400.0)
RBC: 4.23 Mil/uL (ref 3.87–5.11)
RDW: 15.8 % — ABNORMAL HIGH (ref 11.5–15.5)
WBC: 11.3 10*3/uL — ABNORMAL HIGH (ref 4.0–10.5)

## 2018-08-07 LAB — BASIC METABOLIC PANEL
BUN: 16 mg/dL (ref 6–23)
CO2: 31 mEq/L (ref 19–32)
Calcium: 8.6 mg/dL (ref 8.4–10.5)
Chloride: 93 mEq/L — ABNORMAL LOW (ref 96–112)
Creatinine, Ser: 0.69 mg/dL (ref 0.40–1.20)
GFR: 80.75 mL/min (ref >60.00)
Glucose, Bld: 161 mg/dL — ABNORMAL HIGH (ref 70–99)
Potassium: 3.9 mEq/L (ref 3.5–5.1)
Sodium: 131 mEq/L — ABNORMAL LOW (ref 135–145)

## 2018-08-07 NOTE — Patient Instructions (Addendum)
We will do a CXR, ( two view) CBC and BMET We will call you with results. We will place an order through Medicare for an adjustable bed for aspiration pneumonia. Continue using the recommendation that the speech therapist gave you . Do not go to bed for at least an hour after you eat. Make sure you do not rush while you are eating. Continue Augmentin until treatment is completed. Continue Zyrtec daily Continue Nexium 40 mg daily Add pepcid 20 mg at bedtime. Take 1 hour before bed. Continue Flonase daily Continue Singulair Continue Prednisone 5 mg daily. Please get a new PCP. Follow up in [address] Halford Chessman Follow up with Dr. Lucia Gaskins as previously ordered. Please contact office for sooner follow up if symptoms do not improve or worsen or seek emergency care

## 2018-08-07 NOTE — Assessment & Plan Note (Addendum)
Sinusitis Better with treatment with Augmentin Plan: Ayr gel spray per each nare daily as needed . CXR today CBC today BMET today. We will call with labs and CXR result Continue Singulair 10 mg daily Continue prednisone 5 mg daily Flonase 2 sprays each nare once daily Nasal saline as needed for congestion Follow up with Dr. Lucia Gaskins Follow up with Dr. Halford Chessman in 2 weeks

## 2018-08-07 NOTE — Assessment & Plan Note (Signed)
Resolved

## 2018-08-07 NOTE — Progress Notes (Addendum)
History of Present Illness [name] is a 85 y.o. 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 She is a patient of Dr. Halford Chessman.  FAO:ZHYQMVHQION with CREST (managed by Dr. Luciano Cutter sinusitis (managed by Dr. Lucia Gaskins with ENT) Smoker/ Smoking History:Former smoker. 40-pack-year smoking history. Maintenance:5 mg prednisone daily   08/08/2018  2 week follow up for ongoing fatigue, chronic sinusitis, and resolving pneumonia.  Pt. Presents for follow up. She was seen 07/17/2018 for the above noted symptoms. She was walked 1/22, saturations remained 97% or greater, and we were able to discontinue her oxygen, which she felt was causing her Sinusitis.  CXR showed resolving pneumonia, and she was told to continue her Singulair and Flonase and chronic 5 mg prednisone daily. Additionally, we referred her back to her  ENT for evaluation of her chronic sinusitis. She has several other health related issues that I encouraged her to see her PCP to address. She states she felt she needed a new PCP. I encouraged her to make a change to a physician she felt she would be willing to see for the chronic non-pulmonary issues she has that impact her health.   She presents today for follow up. She states she has been back to the hospital. She was admitted for sepsis, thrombocytopenia  and aspiration pneumonia from 07/30/2018-08/03/2018. DIC was negative.Initial thrombocytopenia was likely due to platelet clumping,  hematologist Dr. Alvy Bimler was consulted, levels are stable now, no further work-up, no suspicion for TTP.  Adam TS 13 levels were borderline low but likely inconsequential per Dr. Alvy Bimler.The family were very upset with the patient's  hospital care. IV starts were an issue, along with bruising. She was treated with Unasyn and IVF  as an  inpatient. She was transitioned to Augmentin prior to discharge.The patient had been prescribed Augmentin 875 mg BID  x 7 days prior to hospitalization per [facility].She was discharged with Augmentin 500 mg TID x 7 days. The patient has just continued to take the 875 mg dosage post discharge. She states she continues to have some fever and that she does not feel well. Patient has a history of Zenker's diverticulum and GERD, which .increases the risk of aspiration.  PPI, soft diet, PT eval were completed and she was cleared, we will discharge her with soft diet and home speech therapist for continued follow-up.  If this problem recurs outpatient GI follow-up or a barium swallow can be considered. She has a history of history of esophageal dysphagia, and she was told after a swallow evaluation that she is silently aspirating when she is asleep. She was advised to get a adjustable bed, and not to eat 1-2 hours prior to bed. She has not obtained an adjustable bed. Patient would like to order a hospital bed through Medicare. I have advised her to use a bed wedge until such arrangements can be made. I explained that she needs to be compliant with this as if she continues to aspirate  She states she is not coughing up any secretions. Her sinus drainage is green, but she is currently being treated with Augmentin.She has been instructed to complete all antibiotics.She is here today with her daughter. She denies hemoptysis or chest pain.     Test Results: 07/05/2018 CXR Improving multifocal pneumonia in the right lung. Unchanged mild interstitial pulmonary edema and small bilateral pleural  effusions.  08/07/2018 CXR Persistent though overall improved infiltrates in the right upper lobe with some of the residual densities likely reflecting subsegmental atelectasis.  CBC    Component Value Date/Time   WBC 11.3 (H) 08/07/2018 1505   RBC 4.23 08/07/2018 1505   HGB 10.9 (L) 08/07/2018 1505   HGB 10.8 (L)  10/31/2016 1006   HCT 33.7 (L) 08/07/2018 1505   HCT 34.1 10/31/2016 1006   PLT 331.0 08/07/2018 1505   PLT 363 10/31/2016 1006   MCV 79.7 08/07/2018 1505   MCV 77 (L) 10/31/2016 1006   MCH 26.0 08/03/2018 0412   MCHC 32.4 08/07/2018 1505   RDW 15.8 (H) 08/07/2018 1505   RDW 15.0 10/31/2016 1006   LYMPHSABS 2.0 08/07/2018 1505   MONOABS 0.9 08/07/2018 1505   EOSABS 0.2 08/07/2018 1505   BASOSABS 0.1 08/07/2018 1505    BMET    Component Value Date/Time   NA 131 (L) 08/07/2018 1505   NA 135 09/22/2016 0956   K 3.9 08/07/2018 1505   CL 93 (L) 08/07/2018 1505   CO2 31 08/07/2018 1505   GLUCOSE 161 (H) 08/07/2018 1505   BUN 16 08/07/2018 1505   BUN 33 (H) 09/22/2016 0956   CREATININE 0.69 08/07/2018 1505   CREATININE 0.80 03/30/2016 1042   CALCIUM 8.6 08/07/2018 1505   GFRNONAA >60 08/03/2018 0412   GFRNONAA 64 01/22/2015 1635   GFRAA >60 08/03/2018 0412   GFRAA 74 01/22/2015 1635    Tests: 06/22/2018-chest x-ray-progressive infiltrates in the right lung consistent with pneumonia, new pulmonary vascular congestion interstitial edema and small effusions suggesting congestive heart failure  07/09/2017-CT Angio-negative for acute PE, mild groundglass density in the posterior right upper lobe could relate to minimal residual inflammation or infiltrate   CBC Latest Ref Rng & Units 08/07/2018 08/03/2018 08/02/2018  WBC 4.0 - 10.5 K/uL 11.3(H) 7.4 9.8  Hemoglobin 12.0 - 15.0 g/dL 10.9(L) 10.0(L) 10.8(L)  Hematocrit 36.0 - 46.0 % 33.7(L) 31.1(L) 33.6(L)  Platelets 150.0 - 400.0 K/uL 331.0 231 229    BMP Latest Ref Rng & Units 08/07/2018 08/03/2018 08/02/2018  Glucose 70 - 99 mg/dL 161(H) 215(H) 190(H)  BUN 6 - 23 mg/dL 16 10 7(L)  Creatinine 0.40 - 1.20 mg/dL 0.69 0.69 0.59  BUN/Creat Ratio 12 - 28 - - -  Sodium 135 - 145 mEq/L 131(L) 137 135  Potassium 3.5 - 5.1 mEq/L 3.9 3.4(L) 3.3(L)  Chloride 96 - 112 mEq/L 93(L) 101 102  CO2 19 - 32 mEq/L 31 22 24   Calcium 8.4 - 10.5 mg/dL  8.6 8.0(L) 8.0(L)    BNP    Component Value Date/Time   BNP 214.7 (H) 07/13/2017 1047    ProBNP    Component Value Date/Time   PROBNP 150.0 (H) 02/02/2015 1156    PFT No results found for: FEV1PRE, FEV1POST, FVCPRE, FVCPOST, TLC, DLCOUNC, PREFEV1FVCRT, PSTFEV1FVCRT  Dg Chest 2 View  Result Date: 08/08/2018 CLINICAL DATA:  Follow-up pneumonia. EXAM: CHEST - 2 VIEW COMPARISON:  07/30/2018 FINDINGS: The cardiomediastinal silhouette is unchanged with normal heart size. The lungs are hyperinflated with mild diffuse interstitial coarsening. Suprahilar opacity in the right upper lobe has improved but not completely resolved. There are persistent small linear densities more peripherally in the right upper lobe which have slightly increased superolaterally. Mild bibasilar opacities are more conspicuous than on the most recent prior portable examination but are similar to a two view examination from 07/17/2018. No pleural effusion or pneumothorax is identified. Surgical clips are present in  the lower neck. Aortic atherosclerosis is noted. No acute osseous abnormality is seen. IMPRESSION: Persistent though overall improved infiltrates in the right upper lobe with some of the residual densities likely reflecting subsegmental atelectasis. Electronically Signed   By: Logan Bores M.D.   On: 08/08/2018 09:51   Dg Chest 2 View  Result Date: 07/17/2018 CLINICAL DATA:  Evaluate for pneumonia. EXAM: CHEST - 2 VIEW COMPARISON:  07/05/2018. FINDINGS: Normal heart size. Lungs appear hyperinflated with diffuse coarsened interstitial markings bilaterally. There is been interval resolution of previous pulmonary edema and pleural effusions. Significant improved aeration to both lungs noted compatible with resolving pneumonia. IMPRESSION: 1. Improved aeration of both lungs compatible with resolving pneumonia. 2. Resolution of previous pulmonary edema and pleural effusions. Electronically Signed   By: Kerby Moors  M.D.   On: 07/17/2018 17:21   Ct Head Wo Contrast  Result Date: 07/30/2018 CLINICAL DATA:  Dizziness and fall today.  Initial encounter. EXAM: CT HEAD WITHOUT CONTRAST TECHNIQUE: Contiguous axial images were obtained from the base of the skull through the vertex without intravenous contrast. COMPARISON:  Head CT scan 05/26/2008.  Brain MRI 01/15/2009. FINDINGS: Brain: No evidence of acute infarction, hemorrhage, hydrocephalus, extra-axial collection or mass lesion/mass effect. Mild atrophy noted. Vascular: No hyperdense vessel or unexpected calcification. Skull: Intact.  No focal lesion. Sinuses/Orbits: Status post cataract surgery.  Otherwise negative. Other: None. IMPRESSION: No acute abnormality. Mild atrophy. Electronically Signed   By: Inge Rise M.D.   On: 07/30/2018 16:11   Dg Chest Port 1 View  Result Date: 07/30/2018 CLINICAL DATA:  Dizziness. EXAM: PORTABLE CHEST 1 VIEW COMPARISON:  07/17/2018 06/22/2018.  09/26/2016. FINDINGS: Mediastinum hilar structures normal. Chronic interstitial changes. Right upper lobe infiltrate consistent with pneumonia. No pleural effusion or pneumothorax IMPRESSION: Right upper lobe infiltrate consistent with pneumonia. Electronically Signed   By: Marcello Moores  Register   On: 07/30/2018 10:51   Dg Swallowing Func-speech Pathology  Result Date: 07/31/2018 Objective Swallowing Evaluation: Type of Study: MBS-Modified Barium Swallow Study  Patient Details Name: DHIYA SMITS MRN: [id number] Date of Birth: [birth year] Today's Date: 07/31/2018 Time: SLP Start Time (ACUTE ONLY): 1034 -SLP Stop Time (ACUTE ONLY): 1054 SLP Time Calculation (min) (ACUTE ONLY): 20 min Past Medical History: Past Medical History: Diagnosis Date . Adenomatous colon polyp  . Arthritis  . Blood transfusion  . Cancer [facility])   cervical cancer pre hysterectomy . Cervical disc disease  . Chronic sinusitis  . CONSTIPATION, CHRONIC 06/26/2007  Qualifier: Diagnosis of  By: Nils Pyle CMA ([place]), Mearl Latin   . COPD  (chronic obstructive pulmonary disease) ([place])  . Coronary artery disease   prior MI with stenting of RCA with repeat PTCA/stenting for ISR in 01/2008, stent to LM 11/2008 . CVA (cerebral infarction) 1991 . Depression  . Diabetes mellitus  . Diverticulosis  . 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 Dyslipidemia  . Emphysema  . Fibromyalgia  . GERD (gastroesophageal reflux disease)  . Hypertension  . Hypothyroidism  . IBS (irritable bowel syndrome)  . Legally blind  . Macular degeneration  . Myocardial infarction [facility]) may 27th 2007 . Pneumonia 07/16/2013   HX PNEUMONIA . Pulmonary hypertension ([place])  . Raynaud's phenomenon  . Scleroderma ([place])  . Sjogren's disease ([place])  . Sleep apnea   STOP BANG SCORE 5 . 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 Zenker's diverticulum  Past Surgical History: Past Surgical History: Procedure Laterality Date . APPENDECTOMY   . BALLOON DILATION N/A 07/15/2015  Procedure: BALLOON  DILATION;  Surgeon: Milus Banister, MD;  Location: [facility];  Service: Endoscopy;  Laterality: N/A; . BILATERAL OOPHORECTOMY  2002 . BRONCHOSCOPY  02-22-09 . CARDIAC CATHETERIZATION    x 3 . CATARACT EXTRACTION   . CHOLECYSTECTOMY  1965 . COLON SURGERY  2014  colon resection . colonectomy   . ESOPHAGOGASTRODUODENOSCOPY (EGD) WITH ESOPHAGEAL DILATION N/A 06/18/2013  Procedure: ESOPHAGOGASTRODUODENOSCOPY (EGD) WITH ESOPHAGEAL DILATION;  Surgeon: Inda Castle, MD;  Location: [facility];  Service: Endoscopy;  Laterality: N/A; . FLEXIBLE SIGMOIDOSCOPY N/A 07/15/2015  Procedure: FLEXIBLE SIGMOIDOSCOPY;  Surgeon: Milus Banister, MD;  Location: [facility];  Service: Endoscopy;  Laterality: N/A; . lobe thyroid removal   . PROCTOSCOPY  03/18/2012  Procedure: PROCTOSCOPY;  Surgeon: Adin Hector, MD;  Location: [facility];  Service: General;  Laterality: N/A;  rigid procto . RADIOLOGY WITH ANESTHESIA N/A 03/09/2015  Procedure: MRI CERVICAL SPINE WITHOUT AND LUMBER WITHOUT;  Surgeon: Medication Radiologist, MD;   Location: [place];  Service: Radiology;  Laterality: N/A; . [place]  and implantation of a plate  . STENTED CARDIAC  ARTERY  2007 / 2007 / 2010  X 3 STENT . THYROID LOBECTOMY  1991  removal of left lobe thyroid . TOTAL ABDOMINAL HYSTERECTOMY  1973 HPI: [name]  is a 85 y.o. female, history of Zenker's diverticulum, aspiration pneumonia in the past, hypertension, DM type II, dyslipidemia, fibromyalgia, GERD, stroke in the past with mild weakness in the right hand, both thyroidism, Sjogren's syndrome, Scleroderma on low-dose chronic steroids, who was hospitalized in December for aspiration pneumonia and sepsis. Per chart pt states several days ago she probably had a gagging episode while eating food, she woke up this morning feeling feverish having chills, went to the bathroom where she had a near syncopal episode. Found to have right-sided pneumonia with possible sepsis and severe thrombocytopenia. MBS 07/18/13 revealing primary cervical dysphagia with residue, limited UES opening likely due to pressure of cervical spine. Head turn to left and multiple sips cleared stasis.  No data recorded Assessment / Plan / Recommendation CHL IP CLINICAL IMPRESSIONS 07/31/2018 Clinical Impression Pt demonstrated normal neurologically intact swallow function across liquids and solid consistencies. Pt able to manipulate and control bolus to propel to oropharynx timely and efficiently. Swallow initiation was prompt with adequate laryngeal closure, epiglottic deflection, pharyngeal stripping wave that prevented aspiration and problematic penetration. Pharynx was absent of significant residue. Esophagus was briefly viewed (non diagnostic) and observed previously diagnosed Zenker's diverticulum. If aspiration occurs, given results of MBS, suspect it would be post prandial. At end of study, SLP educated pt on esophageal precautions and pt voiced understanding. No further follow up needed.         SLP Visit Diagnosis  Dysphagia, unspecified (R13.10) Attention and concentration deficit following -- Frontal lobe and executive function deficit following -- Impact on safety and function Mild aspiration risk   CHL IP TREATMENT RECOMMENDATION 07/31/2018 Treatment Recommendations No treatment recommended at this time   Prognosis 05/17/2015 Prognosis for Safe Diet Advancement Fair Barriers to Reach Goals Time post onset;Other (Comment) Barriers/Prognosis Comment -- CHL IP DIET RECOMMENDATION 07/31/2018 SLP Diet Recommendations Regular solids;Thin liquid Liquid Administration via Straw;Cup Medication Administration Whole meds with liquid Compensations Follow solids with liquid Postural Changes Remain semi-upright after after feeds/meals (Comment);Seated upright at 90 degrees   CHL IP OTHER RECOMMENDATIONS 07/31/2018 Recommended Consults -- Oral Care Recommendations Oral care BID Other Recommendations --   CHL IP FOLLOW UP RECOMMENDATIONS 07/31/2018 Follow up Recommendations None  CHL IP FREQUENCY AND DURATION 05/17/2015 Speech Therapy Frequency (ACUTE ONLY) min 2x/week Treatment Duration 1 week      CHL IP ORAL PHASE 07/31/2018 Oral Phase WFL Oral - Pudding Teaspoon -- Oral - Pudding Cup -- Oral - Honey Teaspoon -- Oral - Honey Cup -- Oral - Nectar Teaspoon -- Oral - Nectar Cup -- Oral - Nectar Straw -- Oral - Thin Teaspoon -- Oral - Thin Cup -- Oral - Thin Straw -- Oral - Puree -- Oral - Mech Soft -- Oral - Regular -- Oral - Multi-Consistency -- Oral - Pill -- Oral Phase - Comment --  CHL IP PHARYNGEAL PHASE 07/31/2018 Pharyngeal Phase WFL Pharyngeal- Pudding Teaspoon -- Pharyngeal -- Pharyngeal- Pudding Cup -- Pharyngeal -- Pharyngeal- Honey Teaspoon -- Pharyngeal -- Pharyngeal- Honey Cup -- Pharyngeal -- Pharyngeal- Nectar Teaspoon -- Pharyngeal -- Pharyngeal- Nectar Cup -- Pharyngeal -- Pharyngeal- Nectar Straw -- Pharyngeal -- Pharyngeal- Thin Teaspoon -- Pharyngeal -- Pharyngeal- Thin Cup -- Pharyngeal -- Pharyngeal- Thin Straw -- Pharyngeal  -- Pharyngeal- Puree -- Pharyngeal -- Pharyngeal- Mechanical Soft -- Pharyngeal -- Pharyngeal- Regular -- Pharyngeal -- Pharyngeal- Multi-consistency -- Pharyngeal -- Pharyngeal- Pill -- Pharyngeal -- Pharyngeal Comment --  CHL IP CERVICAL ESOPHAGEAL PHASE 07/31/2018 Cervical Esophageal Phase WFL Pudding Teaspoon -- Pudding Cup -- Honey Teaspoon -- Honey Cup -- Nectar Teaspoon -- Nectar Cup -- Nectar Straw -- Thin Teaspoon -- Thin Cup -- Thin Straw -- Puree -- Mechanical Soft -- Regular -- Multi-consistency -- Pill -- Cervical Esophageal Comment -- [place] Siren 07/31/2018, 2:11 PM  [name] Litaker M.Ed Actor Pager [phone] Office [phone]               Past medical hx Past Medical History:  Diagnosis Date  . Adenomatous colon polyp   . Arthritis   . Blood transfusion   . Cancer [facility])    cervical cancer pre hysterectomy  . Cervical disc disease   . Chronic sinusitis   . CONSTIPATION, CHRONIC 06/26/2007   Qualifier: Diagnosis of  By: Nils Pyle CMA ([place]), Mearl Latin    . COPD (chronic obstructive pulmonary disease) ([place])   . Coronary artery disease    prior MI with stenting of RCA with repeat PTCA/stenting for ISR in 01/2008, stent to LM 11/2008  . CVA (cerebral infarction) 1991  . Depression   . Diabetes mellitus   . Diverticulosis   . 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 Dyslipidemia   . Emphysema   . Fibromyalgia   . GERD (gastroesophageal reflux disease)   . Hypertension   . Hypothyroidism   . IBS (irritable bowel syndrome)   . Legally blind   . Macular degeneration   . Myocardial infarction [facility]) may 27th 2007  . Pneumonia 07/16/2013    HX PNEUMONIA  . Pulmonary hypertension ([place])   . Raynaud's phenomenon   . Scleroderma ([place])   . Sjogren's disease ([place])   . Sleep apnea    STOP BANG SCORE 5  . Stroke [facility]) 1990   brain stem stroke - weakness rt hand  . Zenker's diverticulum      Social History   Tobacco Use  . Smoking status: Former Smoker     Packs/day: 1.00    Years: 40.00    Pack years: 40.00    Types: Cigarettes    Last attempt to quit: 06/26/1988    Years since quitting: 30.1  . Smokeless tobacco: Never Used  . Tobacco comment: 40 pack years  Substance Use Topics  .  Alcohol use: No  . Drug use: No    Ms.Bobak reports that she quit smoking about 30 years ago. Her smoking use included cigarettes. She has a 40.00 pack-year smoking history. She has never used smokeless tobacco. She reports that she does not drink alcohol or use drugs.  Tobacco Cessation: 40 pack year smoking history , quit 1990  Past surgical hx, Family hx, Social hx all reviewed.  Current Outpatient Medications on File Prior to Visit  Medication Sig  . amoxicillin-clavulanate (AUGMENTIN) 500-125 MG tablet Take 1 tablet (500 mg total) by mouth 3 (three) times daily.  [name] aspirin EC 81 MG tablet Take 1 tablet (81 mg total) by mouth daily.  . cetirizine (ZYRTEC) 10 MG tablet Take 10 mg by mouth daily as needed for allergies.  [name] esomeprazole (NEXIUM) 40 MG capsule Take 40 mg by mouth daily.   . fluticasone (FLONASE) 50 MCG/ACT nasal spray Place 1 spray into both nostrils daily.  [name] HYDROcodone-acetaminophen (NORCO) 7.5-325 MG tablet Take 1 tablet by mouth every 6 (six) hours as needed for moderate pain.  [name] imipramine (TOFRANIL) 25 MG tablet Take 75 mg by mouth at bedtime.   . insulin lispro (HUMALOG KWIKPEN) 100 UNIT/ML KwikPen Inject 5 Units into the skin 3 (three) times daily. Inject 6 units into the skin with breakfast, 6 units with lunch, and 5 units with supper  . levothyroxine (SYNTHROID, LEVOTHROID) 100 MCG tablet Take 100 mcg by mouth daily before breakfast.  . montelukast (SINGULAIR) 10 MG tablet Take 10 mg by mouth at bedtime.   . Naphazoline-Pheniramine (OPCON-A) 0.027-0.315 % SOLN Place 1-2 drops into both eyes as needed (for allergies).  . predniSONE (DELTASONE) 5 MG tablet TAKE 1 TABLET ONCE A DAY WITH BREAKFAST (Patient taking differently: Take  5 mg by mouth daily with breakfast. )  . triazolam (HALCION) 0.25 MG tablet Take 0.25 mg by mouth at bedtime as needed for sleep.   . Insulin Glargine (LANTUS SOLOSTAR) 100 UNIT/ML Solostar Pen Inject 5 Units into the skin 3 (three) times daily.    No current facility-administered medications on file prior to visit.      Allergies  Allergen Reactions  . Flomax [Tamsulosin Hcl] Other (See Comments)    Made her 'trachea feel tight and couldn't swallow'  . Peanut Oil Anaphylaxis, Swelling, Rash and Other (See Comments)    Throat tightens, also  . Sulfa Antibiotics Anaphylaxis       . Titanium Other (See Comments)    Neck wouldn't heal with titanium cervical plate  . Yellow Dye Other (See Comments)    "Bee stings" all over body when eating/taking medications with red or yellow dye   . Lantus [Insulin Glargine] Other (See Comments)    Patient states "sulfate binder causes sinus infection/pneumonia".  [place] [Levofloxacin In D5w] Other (See Comments)    Muscle aches  . Metoprolol-Hydrochlorothiazide Other (See Comments)    Decreased B/P  . Norvasc [Amlodipine] Swelling and Other (See Comments)    BIL Ankle edema  . Novolog [Insulin Aspart]     Patient states "sulfate binder causes sinus infection/pneumonia". Tolerates Humalog.  [name] Penciclovir Other (See Comments)    Muscle Pain  . Senna [Sennosides] Other (See Comments)    Pt states that it causes a "stinging" sensation  . Statins Other (See Comments)    All of them make me "feel badly"  . Sulfonamide Derivatives Hives  . Adhesive [Tape] Hives and Rash  . Maxidex [Dexamethasone] Anxiety and Other (See Comments)  Insomnia and dizziness, too    Review Of Systems:  Constitutional:   No  weight loss, night sweats, +  Fevers, chills,+  fatigue, or  lassitude.  HEENT:   No headaches,  Difficulty swallowing,  Tooth/dental problems, or  Sore throat,                No sneezing, itching, ear ache, nasal congestion, post nasal  drip,   CV:  No chest pain,  Orthopnea, PND, swelling in lower extremities, anasarca, dizziness, palpitations, syncope.   GI  No heartburn, indigestion, abdominal pain, nausea, vomiting, diarrhea, change in bowel habits, loss of appetite, bloody stools.   Resp: No shortness of breath with exertion or at rest.  + excess mucus, + productive cough,  No non-productive cough,  No coughing up of blood.  No change in color of mucus.  No wheezing.  No chest wall deformity  Skin: no rash or lesions.Multiple bruises  GU: no dysuria, change in color of urine, no urgency or frequency.  No flank pain, no hematuria   MS:  No joint pain or swelling.  No decreased range of motion.  No back pain.  Psych:  No change in mood or affect. No depression or anxiety.  No memory loss.   Vital Signs BP 126/64 (BP Location: Left Arm, Patient Position: Sitting, Cuff Size: Normal)   Pulse 82   Temp 98.4 F (36.9 C)   Ht 5\' 2"  (1.575 m)   Wt 132 lb 12.8 oz (60.2 kg)   SpO2 100%   BMI 24.29 kg/m    Physical Exam:  General- No distress,  A&Ox3, pleasant ENT: No sinus tenderness, TM clear, pale nasal mucosa, no oral exudate,no post nasal drip, no LAN Cardiac: S1, S2, regular rate and rhythm, no murmur Chest: No wheeze/ rales/ dullness; no accessory muscle use, no nasal flaring, no sternal retractions Abd.: Soft Non-tender Ext: No clubbing cyanosis, edema, bruising Neuro:  normal strength, MAE x 4, A&O x 3 Skin: No rashes, warm and dry Psych: normal mood and behavior   Assessment/Plan  Recurrent sinus infections Sinusitis Better with treatment with Augmentin Plan: [place]yr gel spray per each nare daily as needed . CXR today CBC today BMET today. We will call with labs and CXR result Continue Singulair 10 mg daily Continue prednisone 5 mg daily Flonase 2 sprays each nare once daily Nasal saline as needed for congestion Follow up with Dr. Lucia Gaskins Follow up with Dr. Halford Chessman in 2 weeks     Multifocal  pneumonia Recurrent pneumonia with additional hospital admission 2/4-08/03/2018 for aspiration pneumionia, sepsis and thrombocytopenia Last hospitalization>>  12/23-12/30 with pneumonia Per Speech Therapy, silent aspiration while sleeping Plan: CXR today CBC BMET Flutter valve IS We will place an order through Medicare for an adjustable bed for aspiration pneumonia. Continue using the recommendation that the speech therapist gave you . Do not go to bed for at least an hour after you eat. Make sure you do not rush while you are eating. Continue Augmentin until treatment is completed. Continue Zyrtec daily Continue Nexium 40 mg daily Add pepcid 20 mg at bedtime. Take 1 hour before bed. Follow up with Dr. Halford Chessman in  1 month Please contact office for sooner follow up if symptoms do not improve or worsen or seek emergency care    Hypoxia Resolved  Thrombocytopenia (HCC) Thrombocytopenia Secondary to sepsis Plan CBC today Continue antibiotics until gone Monitor for bruising/ bleeding precautions   Patient encouraged again to seek out new PCP  to manage all of her chronic health  Problems.  08/08/2018 Addendum CXR shows some persistent though overall improved infiltrates in the right upper lobe with some of the residual densities likely reflecting subsegmental atelectasis. I will have patient start IS and flutter valve , we will reassess in 2 weeks with CXR. If there continues to be an issue we may need to consider GI follow up with barium swallow .   Today's visit included 40 minutes of face to face time obtaining medical history, examining the patient , and counseling patient and her daughter regarding this patient's treatments for a very  complicated medical history.   Magdalen Spatz, NP 08/08/2018  10:30 AM

## 2018-08-07 NOTE — Assessment & Plan Note (Addendum)
Recurrent pneumonia with additional hospital admission 2/4-08/03/2018 for aspiration pneumionia, sepsis and thrombocytopenia Last hospitalization>>  12/23-12/30 with pneumonia Per Speech Therapy, silent aspiration while sleeping Plan: CXR today CBC BMET Flutter valve IS We will place an order through Medicare for an adjustable bed for aspiration pneumonia. Continue using the recommendation that the speech therapist gave you . Do not go to bed for at least an hour after you eat. Make sure you do not rush while you are eating. Continue Augmentin until treatment is completed. Continue Zyrtec daily Continue Nexium 40 mg daily Add pepcid 20 mg at bedtime. Take 1 hour before bed. Follow up with Dr. Halford Chessman in  1 month Please contact office for sooner follow up if symptoms do not improve or worsen or seek emergency care

## 2018-08-07 NOTE — Assessment & Plan Note (Signed)
Thrombocytopenia Secondary to sepsis Plan CBC today Continue antibiotics until gone Monitor for bruising/ bleeding precautions

## 2018-08-08 ENCOUNTER — Telehealth: Payer: Self-pay | Admitting: Acute Care

## 2018-08-08 NOTE — Progress Notes (Signed)
Reviewed and agree with assessment/plan.   Casie Sturgeon, MD [facility] 06/21/2016, 12:24 PM Pager:  [phone]  

## 2018-08-08 NOTE — Telephone Encounter (Signed)
Will await fax.

## 2018-08-12 NOTE — Telephone Encounter (Signed)
No forms as of right now.

## 2018-08-12 NOTE — Telephone Encounter (Signed)
Jonelle Sidle, have you seen forms for Sela Hilding to sign?  Routed to Leonard, to follow up

## 2018-08-13 NOTE — Telephone Encounter (Signed)
Called and spoke with Levada Dy she states she has already received that fax back nothing further needed at this time.

## 2018-08-16 DIAGNOSIS — K225 Diverticulum of esophagus, acquired: Secondary | ICD-10-CM | POA: Diagnosis not present

## 2018-08-16 DIAGNOSIS — D696 Thrombocytopenia, unspecified: Secondary | ICD-10-CM | POA: Diagnosis not present

## 2018-08-16 DIAGNOSIS — I10 Essential (primary) hypertension: Secondary | ICD-10-CM | POA: Diagnosis not present

## 2018-08-16 DIAGNOSIS — I2510 Atherosclerotic heart disease of native coronary artery without angina pectoris: Secondary | ICD-10-CM | POA: Diagnosis not present

## 2018-08-16 DIAGNOSIS — E782 Mixed hyperlipidemia: Secondary | ICD-10-CM | POA: Diagnosis not present

## 2018-08-22 ENCOUNTER — Telehealth: Payer: Self-pay | Admitting: Acute Care

## 2018-08-22 NOTE — Telephone Encounter (Signed)
I called [name] to get more details but there was no answer. I left a message for her to call back.

## 2018-08-22 NOTE — Telephone Encounter (Signed)
Please try to schedule patient an appointment for tomorrow for evaluation.

## 2018-08-22 NOTE — Telephone Encounter (Signed)
Primary Pulmonologist: Dr. Halford Chessman Last office visit and with whom: 08/07/2018 with Judson Roch for [name] What do we see them for (pulmonary problems): BOOP Last OV assessment/plan:  We will do a CXR, ( two view) CBC and BMET We will call you with results. We will place an order through Medicare for an adjustable bed for aspiration pneumonia. Continue using the recommendation that the speech therapist gave you . Do not go to bed for at least an hour after you eat. Make sure you do not rush while you are eating. Continue Augmentin until treatment is completed. Continue Zyrtec daily Continue Nexium 40 mg daily Add pepcid 20 mg at bedtime. Take 1 hour before bed. Continue Flonase daily Continue Singulair Continue Prednisone 5 mg daily. Please get a new PCP. Follow up in 1 month with Dr. Halford Chessman Follow up with Dr. Lucia Gaskins as previously ordered. Please contact office for sooner follow up if symptoms do not improve or worsen or seek emergency care   Was appointment offered to patient (explain)?  Roena Malady that the pt will more than likely need an OV, Olivia Mackie wanted to see if anything could be offered over the phone first.   Reason for call:  Spoke with Olivia Mackie. States that pt has a sinus infection. Reports sinus pressure, PND, fever and sinus congestion. When the pt was here last to see Judson Roch, she had these same symptoms but Judson Roch did not give her an antibiotic due to the pt possibly getting C Diff. Pt has been using a Neti-Pot and all of her nasal sprays with minimal relief. She finished Augmentin last week per Sarah's instructions (pt was given this at her hospitalization). Pt is now requesting an antibiotic to be sent in. Olivia Mackie states that she has warned the pt about possible C Diff and antibiotic resistance.  Tonya - please advise. Thanks.

## 2018-08-22 NOTE — Telephone Encounter (Signed)
Spoke with the [name]  She states she has appt with Judson Roch next wk and will wait until then  I advised that we have plenty of openings tomorrow and would be happy to see her, as it is best that she be evaluated in the office  She verbalized understanding but declined appt after I offered multiple times

## 2018-08-22 NOTE — Telephone Encounter (Signed)
Olivia Mackie is returning phone call.  Olivia Mackie phone number is [phone].

## 2018-08-28 ENCOUNTER — Encounter: Payer: Self-pay | Admitting: Acute Care

## 2018-08-28 ENCOUNTER — Other Ambulatory Visit: Payer: Self-pay | Admitting: Acute Care

## 2018-08-28 ENCOUNTER — Ambulatory Visit (INDEPENDENT_AMBULATORY_CARE_PROVIDER_SITE_OTHER)
Admission: RE | Admit: 2018-08-28 | Discharge: 2018-08-28 | Disposition: A | Discharge status: Home / Self Care | Payer: Medicare Other | Source: Ambulatory Visit | Attending: Acute Care | Admitting: Acute Care

## 2018-08-28 ENCOUNTER — Ambulatory Visit (INDEPENDENT_AMBULATORY_CARE_PROVIDER_SITE_OTHER): Payer: Medicare Other | Admitting: Acute Care

## 2018-08-28 ENCOUNTER — Telehealth: Payer: Self-pay | Admitting: Acute Care

## 2018-08-28 VITALS — BP 138/60 | HR 80 | Ht 62.0 in | Wt 131.0 lb

## 2018-08-28 DIAGNOSIS — J690 Pneumonitis due to inhalation of food and vomit: Secondary | ICD-10-CM

## 2018-08-28 DIAGNOSIS — I2510 Atherosclerotic heart disease of native coronary artery without angina pectoris: Secondary | ICD-10-CM

## 2018-08-28 DIAGNOSIS — J329 Chronic sinusitis, unspecified: Secondary | ICD-10-CM | POA: Diagnosis not present

## 2018-08-28 DIAGNOSIS — J189 Pneumonia, unspecified organism: Secondary | ICD-10-CM

## 2018-08-28 DIAGNOSIS — J181 Lobar pneumonia, unspecified organism: Secondary | ICD-10-CM | POA: Diagnosis not present

## 2018-08-28 NOTE — Assessment & Plan Note (Signed)
Continued improvement on CXR despite low grade fever Per speech silent aspiration Plan: We will do a CXR today  We will call you with results If the CXR is worse or does not look better, we will schedule a CT of your chest. Continue using the recommendation that the speech therapist gave you to avoid aspiration. Do not go to bed for at least an hour after you eat. Make sure you do not rush while you are eating. Flutter valve IS Follow up in 2 weeks with Renee Roch NP or Dr. Halford [name]. Please contact office for sooner follow up if symptoms do not improve or worsen or seek emergency care

## 2018-08-28 NOTE — Assessment & Plan Note (Addendum)
Continued discolored secretions Finished Augmentin 08/09/2018>> Her second round Concern for multiple rounds of antibiotics and risk of c diff Plan: We will schedule a CT of your sinuses.  Please collect a nasal sputum specimen ( Culture, AFB, and Fungal) , with sensitivities. Please schedule the follow up with Dr. Lucia Gaskins Continue Singulair 10 mg daily Continue prednisone 5 mg daily Continue Zyrtec daily Flonase 2 sprays each nare once daily Nasal saline as needed for congestion Follow up with Dr. Lucia Gaskins Follow up with Dr. Halford Chessman or [name] in 2 weeks to go over results

## 2018-08-28 NOTE — Telephone Encounter (Signed)
I have called the patient with the results of her CXR. I explained that the pneumonia continues to improve, but that there does appear to be some vascular congestion on her film. She is seen by Dr. Harrington Challenger. She has a cardiac history. I have asked her to keep her 10/11/2018 appointment with Dr. Harrington Challenger, and I have asked her to call tomorrow and try to be seen by an APP this week to determine need for diuretic. She left before I  could check BMET prior to starting therapy.   I have called in Lynd 300 mg twice daily x 7 days. She takes a probiotic daily. I have asked her to start this after tomorrow when she is obtaining a sputum specimen for culture. We discussed stopping antibiotic if she develops diarrhea and calling the office immediately.  She verbalized understanding of the above. She had no further questions   I have called the patients daughter Terri  back. I explained that the omnicef shows as a contraindication based on a allergy to yellow dye. We will hold on antibiotic until we have CT sinus and sputum culture results, as the patient's pneumonia shows continued improvement per CXR. She verbalized understanding and had no further questions.

## 2018-08-28 NOTE — Patient Instructions (Addendum)
It is nice to see you today. We will do a CXR today  We will call you with results If the CXR is worse or does not look better, we will schedule a CT of your chest. We will schedule a CT of your sinuses.  Please collect a nasal sputum specimen ( Culture, AFB, and Fungal) , with sensitivities. Please schedule the follow up with Dr. Lucia Gaskins  Continue Flutter valve Continue IS Continue using the recommendation that the speech therapist gave you . Do not go to bed for at least an hour after you eat. Make sure you do not rush while you are eating. Continue Zyrtec daily Continue Nexium 40 mg daily Add pepcid 20 mg at bedtime. Take 1 hour before bed. Please contact office for sooner follow up if symptoms do not improve or worsen or seek emergency care   Continue Singulair 10 mg daily Continue prednisone 5 mg daily Flonase 2 sprays each nare once daily Nasal saline as needed for congestion Follow up with Dr. Lucia Gaskins  Follow up in 2 weeks with [name] or Dr. Halford Chessman. Please contact office for sooner follow up if symptoms do not improve or worsen or seek emergency care

## 2018-08-28 NOTE — Progress Notes (Signed)
History of Present Illness [name] is a 85 y.o. 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 She is a patient of Dr. Halford Chessman.  TDS:[id number] with CREST (managed by Dr. Luciano Cutter sinusitis (managed by Dr. Lucia Gaskins with ENT) Smoker/ Smoking History:Former smoker. 40-pack-year smoking history. Maintenance:5 mg prednisone daily  Recent Admission 12/23-12/30 For Pneumonia  08/28/2018  2 week Follow up:  Pt. Presents today stating she continues to have sinus issues. She states her secretions are yellow to green in color, and on occasion her secretions are bloody.She states she is having low grade temps of 100. The last fever was 08/27/2018. She states she has the fevers in the morning when she wakes up.She states she also has them  in the afternoons too. She has been using nasal saline and Flonase, which she feels has helped. She is using a nettie pott about once a week. She is using her flutter valve and her incentive spirometer.She states she does occasionally cough while eating. She finished her last Course of Augmentin 08/10/2018. She states her sinuses get better for a short while, and then get worse again. I encouraged her to make an appointment with Dr. Lucia Gaskins her ENT at the last visit. She has not done that as of yet.She states she is compliant with her Singulair daily, and her prednisone 5 mg daily. Her thrombocytopenia has resolved. I suspect it was reactive in relation to her sepsis. She states she has been using the swallow techniques she was taught in the hospital to avoid aspirating. She did get her hospital bed, which has helped her with her drainage issues at night.She is compliant with her Zyrtec Pepcid and Nexium daily.She denies chest pain, orthopnea or hemoptysis.   Test Results: 08/28/2018 CXR Mildly  improved right upper lobe pneumonia. Stable changes of COPD and chronic bronchitis with mild pulmonary vascular congestion.  06/22/2018-chest x-ray-progressive infiltrates in the right lung consistent with pneumonia, new pulmonary vascular congestion interstitial edema and small effusions suggesting congestive heart failure  07/09/2017-CT Angio-negative for acute PE, mild groundglass density in the posterior right upper lobe could relate to minimal residual inflammation or infiltrate  CBC Latest Ref Rng & Units 08/07/2018 08/03/2018 08/02/2018  WBC 4.0 - 10.5 K/uL 11.3(H) 7.4 9.8  Hemoglobin 12.0 - 15.0 g/dL 10.9(L) 10.0(L) 10.8(L)  Hematocrit 36.0 - 46.0 % 33.7(L) 31.1(L) 33.6(L)  Platelets 150.0 - 400.0 K/uL 331.0 231 229    BMP Latest Ref Rng & Units 08/07/2018 08/03/2018 08/02/2018  Glucose 70 - 99 mg/dL 161(H) 215(H) 190(H)  BUN 6 - 23 mg/dL 16 10 7(L)  Creatinine 0.40 - 1.20 mg/dL 0.69 0.69 0.59  BUN/Creat Ratio 12 - 28 - - -  Sodium 135 - 145 mEq/L 131(L) 137 135  Potassium 3.5 - 5.1 mEq/L 3.9 3.4(L) 3.3(L)  Chloride 96 - 112 mEq/L 93(L) 101 102  CO2 19 - 32 mEq/L 31 22 24   Calcium 8.4 - 10.5 mg/dL 8.6 8.0(L) 8.0(L)    BNP    Component Value Date/Time   BNP 214.7 (H) 07/13/2017 1047    ProBNP    Component Value Date/Time   PROBNP 150.0 (H) 02/02/2015 1156    PFT No results found for: FEV1PRE, FEV1POST, FVCPRE, FVCPOST, TLC, DLCOUNC, PREFEV1FVCRT, PSTFEV1FVCRT  Dg Chest 2 View  Result Date: 08/28/2018 CLINICAL DATA:  Follow-up pneumonia. EXAM: CHEST - 2 VIEW COMPARISON:  08/07/2018. FINDINGS: Normal  sized heart. Mild decrease in patchy right upper lobe airspace opacity with mild residual opacity. The lungs remain hyperexpanded with mild prominence of the pulmonary vasculature and interstitial markings with mild peribronchial thickening. Thoracic spine degenerative changes. Left lower neck surgical clips. IMPRESSION: 1. Mildly improved right upper lobe pneumonia. 2. Stable  changes of COPD and chronic bronchitis with mild pulmonary vascular congestion. Electronically Signed   By: Claudie Revering M.D.   On: 08/28/2018 16:06   Dg Chest 2 View  Result Date: 08/08/2018 CLINICAL DATA:  Follow-up pneumonia. EXAM: CHEST - 2 VIEW COMPARISON:  07/30/2018 FINDINGS: The cardiomediastinal silhouette is unchanged with normal heart size. The lungs are hyperinflated with mild diffuse interstitial coarsening. Suprahilar opacity in the right upper lobe has improved but not completely resolved. There are persistent small linear densities more peripherally in the right upper lobe which have slightly increased superolaterally. Mild bibasilar opacities are more conspicuous than on the most recent prior portable examination but are similar to a two view examination from 07/17/2018. No pleural effusion or pneumothorax is identified. Surgical clips are present in the lower neck. Aortic atherosclerosis is noted. No acute osseous abnormality is seen. IMPRESSION: Persistent though overall improved infiltrates in the right upper lobe with some of the residual densities likely reflecting subsegmental atelectasis. Electronically Signed   By: Logan Bores M.D.   On: 08/08/2018 09:51   Ct Head Wo Contrast  Result Date: 07/30/2018 CLINICAL DATA:  Dizziness and fall today.  Initial encounter. EXAM: CT HEAD WITHOUT CONTRAST TECHNIQUE: Contiguous axial images were obtained from the base of the skull through the vertex without intravenous contrast. COMPARISON:  Head CT scan 05/26/2008.  Brain MRI 01/15/2009. FINDINGS: Brain: No evidence of acute infarction, hemorrhage, hydrocephalus, extra-axial collection or mass lesion/mass effect. Mild atrophy noted. Vascular: No hyperdense vessel or unexpected calcification. Skull: Intact.  No focal lesion. Sinuses/Orbits: Status post cataract surgery.  Otherwise negative. Other: None. IMPRESSION: No acute abnormality. Mild atrophy. Electronically Signed   By: Inge Rise  M.D.   On: 07/30/2018 16:11   Dg Chest Port 1 View  Result Date: 07/30/2018 CLINICAL DATA:  Dizziness. EXAM: PORTABLE CHEST 1 VIEW COMPARISON:  07/17/2018 06/22/2018.  09/26/2016. FINDINGS: Mediastinum hilar structures normal. Chronic interstitial changes. Right upper lobe infiltrate consistent with pneumonia. No pleural effusion or pneumothorax IMPRESSION: Right upper lobe infiltrate consistent with pneumonia. Electronically Signed   By: Marcello Moores  Register   On: 07/30/2018 10:51   Dg Swallowing Func-speech Pathology  Result Date: 07/31/2018 Objective Swallowing Evaluation: Type of Study: MBS-Modified Barium Swallow Study  Patient Details Name: TAKELA VARDEN MRN: [id number] Date of Birth: 03/26/1931 Today's Date: 07/31/2018 Time: SLP Start Time (ACUTE ONLY): 1034 -SLP Stop Time (ACUTE ONLY): 1054 SLP Time Calculation (min) (ACUTE ONLY): 20 min Past Medical History: Past Medical History: Diagnosis Date . Adenomatous colon polyp  . Arthritis  . Blood transfusion  . Cancer [facility])   cervical cancer pre hysterectomy . Cervical disc disease  . Chronic sinusitis  . CONSTIPATION, CHRONIC 06/26/2007  Qualifier: Diagnosis of  By: Nils Pyle CMA ([place]), Mearl Latin   . COPD (chronic obstructive pulmonary disease) ([place])  . Coronary artery disease   prior MI with stenting of RCA with repeat PTCA/stenting for ISR in 01/2008, stent to LM 11/2008 . CVA (cerebral infarction) 1991 . Depression  . Diabetes mellitus  . Diverticulosis  . 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[place])  . Dyslipidemia  . Emphysema  . Fibromyalgia  . GERD (gastroesophageal reflux disease)  . Hypertension  . Hypothyroidism  .  IBS (irritable bowel syndrome)  . Legally blind  . Macular degeneration  . Myocardial infarction [facility]) may 27th 2007 . Pneumonia 07/16/2013   HX PNEUMONIA . Pulmonary hypertension ([place])  . Raynaud's phenomenon  . Scleroderma ([place])  . Sjogren's disease ([place])  . Sleep apnea   STOP BANG SCORE 5 . Stroke [facility]) 1990  brain stem stroke - weakness rt hand . Zenker's  diverticulum  Past Surgical History: Past Surgical History: Procedure Laterality Date . APPENDECTOMY   . BALLOON DILATION N/A 07/15/2015  Procedure: BALLOON DILATION;  Surgeon: Milus Banister, MD;  Location: [facility];  Service: Endoscopy;  Laterality: N/A; . BILATERAL OOPHORECTOMY  2002 . BRONCHOSCOPY  02-22-09 . CARDIAC CATHETERIZATION    x 3 . CATARACT EXTRACTION   . CHOLECYSTECTOMY  1965 . COLON SURGERY  2014  colon resection . colonectomy   . ESOPHAGOGASTRODUODENOSCOPY (EGD) WITH ESOPHAGEAL DILATION N/A 06/18/2013  Procedure: ESOPHAGOGASTRODUODENOSCOPY (EGD) WITH ESOPHAGEAL DILATION;  Surgeon: Inda Castle, MD;  Location: [place];  Service: Endoscopy;  Laterality: N/A; . FLEXIBLE SIGMOIDOSCOPY N/A 07/15/2015  Procedure: FLEXIBLE SIGMOIDOSCOPY;  Surgeon: Milus Banister, MD;  Location: [facility];  Service: Endoscopy;  Laterality: N/A; . lobe thyroid removal   . PROCTOSCOPY  03/18/2012  Procedure: PROCTOSCOPY;  Surgeon: Adin Hector, MD;  Location: [facility];  Service: General;  Laterality: N/A;  rigid procto . RADIOLOGY WITH ANESTHESIA N/A 03/09/2015  Procedure: MRI CERVICAL SPINE WITHOUT AND LUMBER WITHOUT;  Surgeon: Medication Radiologist, MD;  Location: [place] [facility];  Service: Radiology;  Laterality: N/A; . [place]  and implantation of a plate  . STENTED CARDIAC  ARTERY  2007 / 2007 / 2010  X 3 STENT . THYROID LOBECTOMY  1991  removal of left lobe thyroid . TOTAL ABDOMINAL HYSTERECTOMY  1973 HPI: Gisele Pack  is a 85 y.o. female, history of Zenker's diverticulum, aspiration pneumonia in the past, hypertension, DM type II, dyslipidemia, fibromyalgia, GERD, stroke in the past with mild weakness in the right hand, both thyroidism, Sjogren's syndrome, Scleroderma on low-dose chronic steroids, who was hospitalized in December for aspiration pneumonia and sepsis. Per chart pt states several days ago she probably had a gagging episode while eating food, she woke up this morning feeling feverish  having chills, went to the bathroom where she had a near syncopal episode. Found to have right-sided pneumonia with possible sepsis and severe thrombocytopenia. MBS 07/18/13 revealing primary cervical dysphagia with residue, limited UES opening likely due to pressure of cervical spine. Head turn to left and multiple sips cleared stasis.  No data recorded Assessment / Plan / Recommendation CHL IP CLINICAL IMPRESSIONS 07/31/2018 Clinical Impression Pt demonstrated normal neurologically intact swallow function across liquids and solid consistencies. Pt able to manipulate and control bolus to propel to oropharynx timely and efficiently. Swallow initiation was prompt with adequate laryngeal closure, epiglottic deflection, pharyngeal stripping wave that prevented aspiration and problematic penetration. Pharynx was absent of significant residue. Esophagus was briefly viewed (non diagnostic) and observed previously diagnosed Zenker's diverticulum. If aspiration occurs, given results of MBS, suspect it would be post prandial. At end of study, SLP educated pt on esophageal precautions and pt voiced understanding. No further follow up needed.         SLP Visit Diagnosis Dysphagia, unspecified (R13.10) Attention and concentration deficit following -- Frontal lobe and executive function deficit following -- Impact on safety and function Mild aspiration risk   CHL IP TREATMENT RECOMMENDATION 07/31/2018 Treatment Recommendations No treatment recommended at this  time   Prognosis 05/17/2015 Prognosis for Safe Diet Advancement Fair Barriers to Reach Goals Time post onset;Other (Comment) Barriers/Prognosis Comment -- CHL IP DIET RECOMMENDATION 07/31/2018 SLP Diet Recommendations Regular solids;Thin liquid Liquid Administration via Straw;Cup Medication Administration Whole meds with liquid Compensations Follow solids with liquid Postural Changes Remain semi-upright after after feeds/meals (Comment);Seated upright at 90 degrees   CHL IP OTHER  RECOMMENDATIONS 07/31/2018 Recommended Consults -- Oral Care Recommendations Oral care BID Other Recommendations --   CHL IP FOLLOW UP RECOMMENDATIONS 07/31/2018 Follow up Recommendations None   CHL IP FREQUENCY AND DURATION 05/17/2015 Speech Therapy Frequency (ACUTE ONLY) min 2x/week Treatment Duration 1 week      CHL IP ORAL PHASE 07/31/2018 Oral Phase WFL Oral - Pudding Teaspoon -- Oral - Pudding Cup -- Oral - Honey Teaspoon -- Oral - Honey Cup -- Oral - Nectar Teaspoon -- Oral - Nectar Cup -- Oral - Nectar Straw -- Oral - Thin Teaspoon -- Oral - Thin Cup -- Oral - Thin Straw -- Oral - Puree -- Oral - Mech Soft -- Oral - Regular -- Oral - Multi-Consistency -- Oral - Pill -- Oral Phase - Comment --  CHL IP PHARYNGEAL PHASE 07/31/2018 Pharyngeal Phase WFL Pharyngeal- Pudding Teaspoon -- Pharyngeal -- Pharyngeal- Pudding Cup -- Pharyngeal -- Pharyngeal- Honey Teaspoon -- Pharyngeal -- Pharyngeal- Honey Cup -- Pharyngeal -- Pharyngeal- Nectar Teaspoon -- Pharyngeal -- Pharyngeal- Nectar Cup -- Pharyngeal -- Pharyngeal- Nectar Straw -- Pharyngeal -- Pharyngeal- Thin Teaspoon -- Pharyngeal -- Pharyngeal- Thin Cup -- Pharyngeal -- Pharyngeal- Thin Straw -- Pharyngeal -- Pharyngeal- Puree -- Pharyngeal -- Pharyngeal- Mechanical Soft -- Pharyngeal -- Pharyngeal- Regular -- Pharyngeal -- Pharyngeal- Multi-consistency -- Pharyngeal -- Pharyngeal- Pill -- Pharyngeal -- Pharyngeal Comment --  CHL IP CERVICAL ESOPHAGEAL PHASE 07/31/2018 Cervical Esophageal Phase WFL Pudding Teaspoon -- Pudding Cup -- Honey Teaspoon -- Honey Cup -- Nectar Teaspoon -- Nectar Cup -- Nectar Straw -- Thin Teaspoon -- Thin Cup -- Thin Straw -- Puree -- Mechanical Soft -- Regular -- Multi-consistency -- Pill -- Cervical Esophageal Comment -- [place] Siren 07/31/2018, 2:11 PM  [name] Litaker M.Ed Actor Pager [phone] Office [phone]               Past medical hx Past Medical History:  Diagnosis Date  . Adenomatous  colon polyp   . Arthritis   . Blood transfusion   . Cancer [facility])    cervical cancer pre hysterectomy  . Cervical disc disease   . Chronic sinusitis   . CONSTIPATION, CHRONIC 06/26/2007   Qualifier: Diagnosis of  By: Nils Pyle CMA ([place]), Mearl Latin    . COPD (chronic obstructive pulmonary disease) ([place])   . Coronary artery disease    prior MI with stenting of RCA with repeat PTCA/stenting for ISR in 01/2008, stent to LM 11/2008  . CVA (cerebral infarction) 1991  . Depression   . Diabetes mellitus   . Diverticulosis   . 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 Dyslipidemia   . Emphysema   . Fibromyalgia   . GERD (gastroesophageal reflux disease)   . Hypertension   . Hypothyroidism   . IBS (irritable bowel syndrome)   . Legally blind   . Macular degeneration   . Myocardial infarction [facility]) may 27th 2007  . Pneumonia 07/16/2013    HX PNEUMONIA  . Pulmonary hypertension ([place])   . Raynaud's phenomenon   . Scleroderma ([place])   . Sjogren's disease ([place])   . Sleep apnea  STOP BANG SCORE 5  . Stroke [facility]) 1990   brain stem stroke - weakness rt hand  . Zenker's diverticulum      Social History   Tobacco Use  . Smoking status: Former Smoker    Packs/day: 1.00    Years: 40.00    Pack years: 40.00    Types: Cigarettes    Last attempt to quit: 06/26/1988    Years since quitting: 30.1  . Smokeless tobacco: Never Used  . Tobacco comment: 40 pack years  Substance Use Topics  . Alcohol use: No  . Drug use: No    Ms.Venn reports that she quit smoking about 30 years ago. Her smoking use included cigarettes. She has a 40.00 pack-year smoking history. She has never used smokeless tobacco. She reports that she does not drink alcohol or use drugs.  Tobacco Cessation: Former smoker, quit 1990, with a 40 pack year smoking history  Past surgical hx, Family hx, Social hx all reviewed.  Current Outpatient Medications on File Prior to Visit  Medication Sig  . amoxicillin-clavulanate (AUGMENTIN)  500-125 MG tablet Take 1 tablet (500 mg total) by mouth 3 (three) times daily.  [name] aspirin EC 81 MG tablet Take 1 tablet (81 mg total) by mouth daily.  . cetirizine (ZYRTEC) 10 MG tablet Take 10 mg by mouth daily as needed for allergies.  [name] esomeprazole (NEXIUM) 40 MG capsule Take 40 mg by mouth daily.   . fluticasone (FLONASE) 50 MCG/ACT nasal spray Place 1 spray into both nostrils daily.  [name] HYDROcodone-acetaminophen (NORCO) 7.5-325 MG tablet Take 1 tablet by mouth every 6 (six) hours as needed for moderate pain.  [name] imipramine (TOFRANIL) 25 MG tablet Take 75 mg by mouth at bedtime.   . Insulin Glargine (LANTUS SOLOSTAR) 100 UNIT/ML Solostar Pen Inject 5 Units into the skin 3 (three) times daily.   . insulin lispro (HUMALOG KWIKPEN) 100 UNIT/ML KwikPen Inject 5 Units into the skin 3 (three) times daily. Inject 6 units into the skin with breakfast, 6 units with lunch, and 5 units with supper  . levothyroxine (SYNTHROID, LEVOTHROID) 100 MCG tablet Take 100 mcg by mouth daily before breakfast.  . montelukast (SINGULAIR) 10 MG tablet Take 10 mg by mouth at bedtime.   . Naphazoline-Pheniramine (OPCON-A) 0.027-0.315 % SOLN Place 1-2 drops into both eyes as needed (for allergies).  . predniSONE (DELTASONE) 5 MG tablet TAKE 1 TABLET ONCE A DAY WITH BREAKFAST (Patient taking differently: Take 5 mg by mouth daily with breakfast. )  . triazolam (HALCION) 0.25 MG tablet Take 0.25 mg by mouth at bedtime as needed for sleep.    No current facility-administered medications on file prior to visit.      Allergies  Allergen Reactions  . Flomax [Tamsulosin Hcl] Other (See Comments)    Made her 'trachea feel tight and couldn't swallow'  . Peanut Oil Anaphylaxis, Swelling, Rash and Other (See Comments)    Throat tightens, also  . Sulfa Antibiotics Anaphylaxis       . Titanium Other (See Comments)    Neck wouldn't heal with titanium cervical plate  . Yellow Dye Other (See Comments)    "Bee stings" all over  body when eating/taking medications with red or yellow dye   . Lantus [Insulin Glargine] Other (See Comments)    Patient states "sulfate binder causes sinus infection/pneumonia".  [place] [Levofloxacin In D5w] Other (See Comments)    Muscle aches  . Metoprolol-Hydrochlorothiazide Other (See Comments)    Decreased B/P  .  Norvasc [Amlodipine] Swelling and Other (See Comments)    BIL Ankle edema  . Novolog [Insulin Aspart]     Patient states "sulfate binder causes sinus infection/pneumonia". Tolerates Humalog.  [name] Penciclovir Other (See Comments)    Muscle Pain  . Senna [Sennosides] Other (See Comments)    Pt states that it causes a "stinging" sensation  . Statins Other (See Comments)    All of them make me "feel badly"  . Sulfonamide Derivatives Hives  . Adhesive [Tape] Hives and Rash  . Maxidex [Dexamethasone] Anxiety and Other (See Comments)    Insomnia and dizziness, too    Review Of Systems:  Constitutional:   No  weight loss, night sweats, + low grade  Fevers, chills,+  fatigue, or  lassitude.  HEENT:   No headaches,  Difficulty swallowing,  Tooth/dental problems, or  Sore throat,                No sneezing, itching, ear ache, + nasal congestion, + post nasal drip, yellow to green occasionally bloody secretions.  CV:  No chest pain,  Orthopnea, PND, swelling in lower extremities, anasarca, dizziness, palpitations, syncope.   GI  No heartburn, indigestion, abdominal pain, nausea, vomiting, diarrhea, change in bowel habits, loss of appetite, bloody stools.   Resp: No shortness of breath with exertion or at rest.  + excess mucus, no productive cough,  No non-productive cough,  No coughing up of blood.  + change in color of mucus.  No wheezing.  No chest wall deformity  Skin: no rash or lesions.  GU: no dysuria, change in color of urine, no urgency or frequency.  No flank pain, no hematuria   MS:  No joint pain or swelling.  No decreased range of motion.  No back  pain.  Psych:  No change in mood or affect. No depression or anxiety.  No memory loss.   Vital Signs BP 138/60 (BP Location: Left Arm, Cuff Size: Normal)   Pulse 80   Ht 5\' 2"  (1.575 m)   Wt 131 lb (59.4 kg)   SpO2 96%   BMI 23.96 kg/m    Physical Exam:  General- No distress,  A&Ox3, pleasant ENT: No sinus tenderness, TM clear, pale nasal mucosa, no oral exudate,no post nasal drip, no LAN Cardiac: S1, S2, regular rate and rhythm, no murmur Chest: No wheeze/ rales/ dullness; no accessory muscle use, no nasal flaring, no sternal retractions, diminished per right base Abd.: Soft Non-tender, ND, BS + Ext: No clubbing cyanosis, edema Neuro: deconditioned at baseline, MAE x 4, A&O x 3 Skin: No rashes, warm and dry Psych: normal mood and behavior   Assessment/Plan  Multifocal pneumonia Continued improvement on CXR despite low grade fever Per speech silent aspiration Plan: We will do a CXR today  We will call you with results If the CXR is worse or does not look better, we will schedule a CT of your chest. Continue using the recommendation that the speech therapist gave you to avoid aspiration. Do not go to bed for at least an hour after you eat. Make sure you do not rush while you are eating. Flutter valve IS Follow up in 2 weeks with [name] or Dr. Halford Chessman. Please contact office for sooner follow up if symptoms do not improve or worsen or seek emergency care     Recurrent sinus infections Continued discolored secretions Finished Augmentin 08/09/2018>> Her second round Concern for multiple rounds of antibiotics and risk of c diff Plan: We  will schedule a CT of your sinuses.  Please collect a nasal sputum specimen ( Culture, AFB, and Fungal) , with sensitivities. Please schedule the follow up with Dr. Lucia Gaskins Continue Singulair 10 mg daily Continue prednisone 5 mg daily Continue Zyrtec daily Flonase 2 sprays each nare once daily Nasal saline as needed for  congestion Follow up with Dr. Lucia Gaskins Follow up with Dr. Halford Chessman or [name] in 2 weeks to go over results    GERD Continue Nexium 40 mg daily Add pepcid 20 mg at bedtime. Take 1 hour before bed. Sleep with head elevated  ( Pt. Now has a hospital bed and can sleep with head elevated to prevent silent aspiration of secretions)  This appointment was 40 min long with over 50% of the time in direct face-to-face patient care, assessment, plan of care, and follow-up.   Magdalen Spatz, NP 08/28/2018  5:38 PM

## 2018-08-28 NOTE — Progress Notes (Signed)
Reviewed and agree with assessment/plan.   Chevy Sweigert, MD [place] [facility] 06/21/2016, 12:24 PM Pager:  [phone]  

## 2018-08-29 ENCOUNTER — Ambulatory Visit (INDEPENDENT_AMBULATORY_CARE_PROVIDER_SITE_OTHER)
Admission: RE | Admit: 2018-08-29 | Discharge: 2018-08-29 | Disposition: A | Discharge status: Home / Self Care | Payer: Medicare Other | Source: Ambulatory Visit | Attending: Acute Care | Admitting: Acute Care

## 2018-08-29 ENCOUNTER — Telehealth: Payer: Self-pay

## 2018-08-29 DIAGNOSIS — J329 Chronic sinusitis, unspecified: Secondary | ICD-10-CM

## 2018-08-29 NOTE — Telephone Encounter (Signed)
08-29-18/11:11am Hi [name], Per Pt she is here today for a CT.  However Pt was told by Judson Roch, PA of Dr. Juanetta Gosling office to come to this office today to be seen by a physician or PA PN:TBHGR. Pt has appt on 10-11-18, but states Judson Roch, [place] states she should be seen sooner.   Renee/check-out

## 2018-09-03 ENCOUNTER — Encounter: Payer: Self-pay | Admitting: Cardiology

## 2018-09-03 ENCOUNTER — Ambulatory Visit (INDEPENDENT_AMBULATORY_CARE_PROVIDER_SITE_OTHER): Payer: Medicare Other | Admitting: Cardiology

## 2018-09-03 VITALS — BP 150/56 | HR 82 | Ht 62.0 in | Wt 131.1 lb

## 2018-09-03 DIAGNOSIS — I10 Essential (primary) hypertension: Secondary | ICD-10-CM | POA: Diagnosis not present

## 2018-09-03 DIAGNOSIS — I2510 Atherosclerotic heart disease of native coronary artery without angina pectoris: Secondary | ICD-10-CM | POA: Diagnosis not present

## 2018-09-03 DIAGNOSIS — I519 Heart disease, unspecified: Secondary | ICD-10-CM

## 2018-09-03 DIAGNOSIS — I5189 Other ill-defined heart diseases: Secondary | ICD-10-CM | POA: Insufficient documentation

## 2018-09-03 MED ORDER — FUROSEMIDE 20 MG PO TABS: 20.0000 mg | ORAL_TABLET | Freq: Every day | ORAL | 3 refills | Status: DC

## 2018-09-03 NOTE — Progress Notes (Signed)
Cardiology Office Note:    Date:  09/03/2018   ID:  [name], DOB 29-Apr-1931, MRN [id number]  PCP:  Renee Seashore, MD  Cardiologist:  Renee Carnes, MD  Referring MD: Renee Seashore, MD   Chief Complaint  Patient presents with  . Hospitalization Follow-up    Extra volume, diastolic dysfunction  . Hypertension    History of Present Illness:    [name] is a 85 y.o. female with a past medical history significant for CAD (prior MI with stenting of RCA with repeat PTCA/stenting for ISR in 01/2008, stent to LM 11/2008), prior Dressler's syndrome post-MI, fibromyalgia, multiple medication intolerances, BOOP (followed by pulmonary), chronic diverticulitis, Scleroderma, Sjogren's, chronic steroid use, sleep apnea, hyperlipidemia, hypertension, stroke in 1990s, pulmonary hypertension, anemia, depression, GERD, type 2 diabetes who presents for f/u BP. Last cath 2011 showed 40% mid LAD lesion, 30% diag, 50% Lcx, 50% OM3, small OM1 with 80% lesion, RCA with 20% instent restenosis, 30% distal RCA. She had a normal stress test 08/2017.   She was admitted to the hospital on 05/2018 for sepsis due to multifocal pneumonia and required oxygen at discharge.  She did have chest pain during that hospitalization and was plan for possible future work-up when acute illness resolved.  She was seen in the office on 07/12/2018 for follow-up and was feeling well.  She was having some elevated blood pressure suspected to be related to higher doses of prednisone.  She was asymptomatic from a CAD standpoint.  She was again admitted to the hospital on 07/30/2018 with sepsis with thrombocytopenia due to aspiration pneumonia.  She was given IV antibiotics and required 1 unit of platelets.  Thrombocytopenia was felt to be possibly due to platelet clumping.   [name] is here today for evaluation of extra volume as recommended by her pulmonology practitioner. CXR on 08/28/2018 showed some mild pulmonary  vascular congestion in addition to mildly improved upper lobe pneumonia. Since the patient has had persistent pneumonia, there was concern that there may be some CHF contributing. Today she tells me that she has had no swelling but she has lost 10 pounds since hospitalization.   She has been short of breath with minimal exertion and going up stairs. No peripheral edema. She occ feels tight in her chest. She reports that her BP runs high and has been as high as 200/100 when she gets to the top of the stairs. She says that she has been tried on many BP meds that she did not tolerate. She was told by her PCP not to ever take any antihypertensives. She has tolerated lasix in the past.   She has been told that she refluxes at night that causes aspiration. She sleeps in a hospital bed with [facility] up for that reason. She denies orthopnea or PND.   She takes chronic prednisone for scleroderma.    Past Medical History:  Diagnosis Date  . Adenomatous colon polyp   . Arthritis   . Blood transfusion   . Cancer [facility])    cervical cancer pre hysterectomy  . Cervical disc disease   . Chronic sinusitis   . CONSTIPATION, CHRONIC 06/26/2007   Qualifier: Diagnosis of  By: Nils Pyle CMA ([place]), Mearl Latin    . COPD (chronic obstructive pulmonary disease) ([place])   . Coronary artery disease    prior MI with stenting of RCA with repeat PTCA/stenting for ISR in 01/2008, stent to LM 11/2008  . CVA (cerebral infarction) 1991  . Depression   .  Diabetes mellitus   . Diverticulosis   . 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[place])   . Dyslipidemia   . Emphysema   . Fibromyalgia   . GERD (gastroesophageal reflux disease)   . Hypertension   . Hypothyroidism   . IBS (irritable bowel syndrome)   . Legally blind   . Macular degeneration   . Myocardial infarction [facility]) may 27th 2007  . Pneumonia 07/16/2013    HX PNEUMONIA  . Pulmonary hypertension ([place])   . Raynaud's phenomenon   . Scleroderma ([place])   . Sjogren's disease ([place])   . Sleep  apnea    STOP BANG SCORE 5  . Stroke [facility]) 1990   brain stem stroke - weakness rt hand  . Zenker's diverticulum     Past Surgical History:  Procedure Laterality Date  . APPENDECTOMY    . BALLOON DILATION N/A 07/15/2015   Procedure: BALLOON DILATION;  Surgeon: Milus Banister, MD;  Location: [facility];  Service: Endoscopy;  Laterality: N/A;  . BILATERAL OOPHORECTOMY  2002  . BRONCHOSCOPY  02-22-09  . CARDIAC CATHETERIZATION     x 3  . CATARACT EXTRACTION    . CHOLECYSTECTOMY  1965  . COLON SURGERY  2014   colon resection  . colonectomy    . ESOPHAGOGASTRODUODENOSCOPY (EGD) WITH ESOPHAGEAL DILATION N/A 06/18/2013   Procedure: ESOPHAGOGASTRODUODENOSCOPY (EGD) WITH ESOPHAGEAL DILATION;  Surgeon: Inda Castle, MD;  Location: [place];  Service: Endoscopy;  Laterality: N/A;  . FLEXIBLE SIGMOIDOSCOPY N/A 07/15/2015   Procedure: FLEXIBLE SIGMOIDOSCOPY;  Surgeon: Milus Banister, MD;  Location: [facility];  Service: Endoscopy;  Laterality: N/A;  . lobe thyroid removal    . PROCTOSCOPY  03/18/2012   Procedure: PROCTOSCOPY;  Surgeon: Adin Hector, MD;  Location: [facility];  Service: General;  Laterality: N/A;  rigid procto  . RADIOLOGY WITH ANESTHESIA N/A 03/09/2015   Procedure: MRI CERVICAL SPINE WITHOUT AND LUMBER WITHOUT;  Surgeon: Medication Radiologist, MD;  Location: [place];  Service: Radiology;  Laterality: N/A;  . [place]   and implantation of a plate   . STENTED CARDIAC  ARTERY  2007 / 2007 / 2010   X 3 STENT  . THYROID LOBECTOMY  1991   removal of left lobe thyroid  . TOTAL ABDOMINAL HYSTERECTOMY  1973    Current Medications: Current Meds  Medication Sig  . aspirin EC 81 MG tablet Take 1 tablet (81 mg total) by mouth daily.  . cetirizine (ZYRTEC) 10 MG tablet Take 10 mg by mouth daily as needed for allergies.  [name] esomeprazole (NEXIUM) 40 MG capsule Take 40 mg by mouth daily.   . fluticasone (FLONASE) 50 MCG/ACT nasal spray Place 1 spray into both nostrils  daily.  [name] HYDROcodone-acetaminophen (NORCO) 7.5-325 MG tablet Take 1 tablet by mouth every 6 (six) hours as needed for moderate pain.  [name] imipramine (TOFRANIL) 25 MG tablet Take 75 mg by mouth at bedtime.   . insulin lispro (HUMALOG KWIKPEN) 100 UNIT/ML KwikPen Inject 5 Units into the skin 3 (three) times daily. Inject 6 units into the skin with breakfast, 6 units with lunch, and 5 units with supper  . levothyroxine (SYNTHROID, LEVOTHROID) 100 MCG tablet Take 100 mcg by mouth daily before breakfast.  . montelukast (SINGULAIR) 10 MG tablet Take 10 mg by mouth at bedtime.   . Naphazoline-Pheniramine (OPCON-A) 0.027-0.315 % SOLN Place 1-2 drops into both eyes as needed (for allergies).  . predniSONE (DELTASONE) 5 MG tablet TAKE 1 TABLET ONCE A DAY  WITH BREAKFAST  . triazolam (HALCION) 0.25 MG tablet Take 0.25 mg by mouth at bedtime as needed for sleep.      Allergies:   Flomax [tamsulosin hcl]; Peanut oil; Sulfa antibiotics; Titanium; Yellow dye; Lantus [insulin glargine]; Levaquin [levofloxacin in d5w]; Metoprolol-hydrochlorothiazide; Norvasc [amlodipine]; Novolog [insulin aspart]; Penciclovir; Senna [sennosides]; Statins; Sulfonamide derivatives; Adhesive [tape]; and Maxidex [dexamethasone]   Social History   Socioeconomic History  . Marital status: Divorced    Spouse name: Not on file  . Number of children: Not on file  . Years of education: Not on file  . Highest education level: Not on file  Occupational History  . Occupation: retired    Fish farm manager: RETIRED  Social Needs  . Financial resource strain: Not on file  . Food insecurity:    Worry: Not on file    Inability: Not on file  . Transportation needs:    Medical: Not on file    Non-medical: Not on file  Tobacco Use  . Smoking status: Former Smoker    Packs/day: 1.00    Years: 40.00    Pack years: 40.00    Types: Cigarettes    Last attempt to quit: 06/26/1988    Years since quitting: 30.2  . Smokeless tobacco: Never Used  .  Tobacco comment: 40 pack years  Substance and Sexual Activity  . Alcohol use: No  . Drug use: No  . Sexual activity: Not on file  Lifestyle  . Physical activity:    Days per week: Not on file    Minutes per session: Not on file  . Stress: Not on file  Relationships  . Social connections:    Talks on phone: Patient refused    Gets together: Patient refused    Attends religious service: Patient refused    Active member of club or organization: Patient refused    Attends meetings of clubs or organizations: Patient refused    Relationship status: Patient refused  Other Topics Concern  . Not on file  Social History Narrative  . Not on file     Family History: The patient's family history includes Cancer in her brother and sister; Diabetes in her brother, brother, father, and sister; Heart disease in her father; Other (age of onset: 36) in her mother; Stroke (age of onset: 25) in her father. ROS:   Please see the history of present illness.     All other systems reviewed and are negative.  EKGs/Labs/Other Studies Reviewed:    The following studies were reviewed today:  Echocardiogram 06/18/2018 Study Conclusions - Left ventricle: The cavity size was normal. Systolic function was   normal. The estimated ejection fraction was in the range of 60%   to 65%. Wall motion was normal; there were no regional wall   motion abnormalities. Features are consistent with a pseudonormal   left ventricular filling pattern, with concomitant abnormal   relaxation and increased filling pressure (grade 2 diastolic   dysfunction). - Aortic valve: Transvalvular velocity was within the normal range.   There was no stenosis. There was no regurgitation. - Mitral valve: Transvalvular velocity was within the normal range.   There was no evidence for stenosis. There was trivial   regurgitation. Valve area by continuity equation (using LVOT   flow): 1.53 cm^2. - Left atrium: The atrium was moderately  dilated. - Right ventricle: The cavity size was normal. Wall thickness was   normal. Systolic function was normal. - Atrial septum: No defect or patent foramen ovale was  identified   by color flow Doppler. - Tricuspid valve: There was mild regurgitation. - Pulmonary arteries: Systolic pressure was within the normal   range. PA peak pressure: 36 mm Hg (S). - Pericardium, extracardiac: A trivial pericardial effusion was   identified.  Lexiscan Myoview 09/11/2017 Stress Findings A pharmacological stress test was performed using IV Lexiscan 0.4mg  over 10 seconds performed without concurrent submaximal exercise.  The patient reported shortness of breath and headache during the stress test.      EKG:  EKG is not ordered today.   Recent Labs: 07/30/2018: TSH 1.016 08/03/2018: ALT 18; Magnesium 1.8 08/07/2018: BUN 16; Creatinine, Ser 0.69; Hemoglobin 10.9; Platelets 331.0; Potassium 3.9; Sodium 131   Recent Lipid Panel    Component Value Date/Time   CHOL 226 (H) 07/14/2017 0842   TRIG 154 (H) 07/14/2017 0842   HDL 55 07/14/2017 0842   CHOLHDL 4.1 07/14/2017 0842   VLDL 31 07/14/2017 0842   LDLCALC 140 (H) 07/14/2017 0842   LDLDIRECT 127.6 05/15/2013 1422    Physical Exam:    VS:  BP (!) 150/56   Pulse 82   Ht 5\' 2"  (1.575 m)   Wt 131 lb 1.9 oz (59.5 kg)   SpO2 97%   BMI 23.98 kg/m     Wt Readings from Last 3 Encounters:  09/03/18 131 lb 1.9 oz (59.5 kg)  08/28/18 131 lb (59.4 kg)  08/07/18 132 lb 12.8 oz (60.2 kg)     Physical Exam  Constitutional: She is oriented to person, place, and time. She appears well-developed and well-nourished. No distress.  HENT:  Head: Normocephalic and atraumatic.  Neck: Normal range of motion. Neck supple. No JVD present.  Cardiovascular: Normal rate, regular rhythm, normal heart sounds and intact distal pulses. Exam reveals no gallop and no friction rub.  No murmur heard. Pulmonary/Chest: Effort normal and breath sounds normal. No  respiratory distress. She has no wheezes. She has no rales.  Abdominal: Soft. Bowel sounds are normal.  Musculoskeletal: Normal range of motion.        General: No edema.  Neurological: She is alert and oriented to person, place, and time.  Skin: Skin is warm and dry.  Psychiatric: She has a normal mood and affect. Her behavior is normal. Judgment and thought content normal.  Vitals reviewed.   ASSESSMENT:    1. Grade II diastolic dysfunction   2. Essential hypertension   3. Atherosclerosis of native coronary artery of native heart without angina pectoris    PLAN:    In order of problems listed above:  Volume overload/diastolic dysfunction -Echo in 05/2018 showed EF 60-65%,   Grade 2 DD -Pt does not appear volume overloaded but she does have DOE.  -Pulmonology is considering pulmonary edema as contributing to her persistent PNA which is reportedly related to reflux and aspiration.  -Will start lasix 20 mg daily. I discussed monitoring for volume depletion with pt and what to report.  -Also her BP is running high which could be contributing to diastolic dysfunction. Hopefully adding lasix with help BP.   Hypertension -BP uncontrolled and pt with multiple drug intolerances.  -Will start lasix as above. She says that she tolerated it in the past and it helped her BP.  -She limits her sodium intake.  -I will have her follow up in our HTN clinic in 2 weeks to reassess her BP on lasix and make any further recommendations for BP control.   CAD -prior MI with stenting of RCA with  repeat PTCA/stenting for ISR in 01/2008, stent to LM 11/2008 -No chest pain  History of pulmonary HTN -normal PASP on last echo     Medication Adjustments/Labs and Tests Ordered: Current medicines are reviewed at length with the patient today.  Concerns regarding medicines are outlined above. Labs and tests ordered and medication changes are outlined in the patient instructions below:  Patient Instructions   Medication Instructions:  1.) START: Lasix 20 mg daily  If you need a refill on your cardiac medications before your next appointment, please call your pharmacy.   Lab work: None  If you have labs (blood work) drawn today and your tests are completely normal, you will receive your results only by: [name] MyChart Message (if you have MyChart) OR . A paper copy in the mail If you have any lab test that is abnormal or we need to change your treatment, we will call you to review the results.  Testing/Procedures: None  Follow-Up: You are scheduled to see Dr. Harrington Challenger on 10/11/2018 @ 8:00 AM  Follow up with the hypertension clinic in 2 weeks   Any Other Special Instructions Will Be Listed Below (If Applicable).       Signed, Daune Perch, NP  09/03/2018 4:53 PM    [facility]

## 2018-09-03 NOTE — Patient Instructions (Signed)
Medication Instructions:  1.) START: Lasix 20 mg daily  If you need a refill on your cardiac medications before your next appointment, please call your pharmacy.   Lab work: None  If you have labs (blood work) drawn today and your tests are completely normal, you will receive your results only by: [name] MyChart Message (if you have MyChart) OR . A paper copy in the mail If you have any lab test that is abnormal or we need to change your treatment, we will call you to review the results.  Testing/Procedures: None  Follow-Up: You are scheduled to see Dr. Harrington Challenger on 10/11/2018 @ 8:00 AM  Follow up with the hypertension clinic in 2 weeks   Any Other Special Instructions Will Be Listed Below (If Applicable).

## 2018-09-04 ENCOUNTER — Telehealth: Payer: Self-pay | Admitting: Acute Care

## 2018-09-04 NOTE — Telephone Encounter (Signed)
I spoke with Dr. Estill Cotta.  Advised that it would be okay for them to prescribe augmentin as needed if [name] develops recurrent aspiration pneumonia.  Also explained that she sometimes gets inflammatory infiltrate in rt upper lobe that responds to increase in prednisone.

## 2018-09-04 NOTE — Telephone Encounter (Signed)
Last 3 office visits have been with [name] who is not in the office all this week Patient last seen by Dr Halford Chessman 3.25.2019  Dr Halford Chessman, are you okay with speak with Dr Estill Cotta or would you recommend [name] speaking with her?  Thank you.

## 2018-09-05 DIAGNOSIS — R351 Nocturia: Secondary | ICD-10-CM | POA: Diagnosis not present

## 2018-09-05 DIAGNOSIS — N3941 Urge incontinence: Secondary | ICD-10-CM | POA: Diagnosis not present

## 2018-09-11 ENCOUNTER — Ambulatory Visit (INDEPENDENT_AMBULATORY_CARE_PROVIDER_SITE_OTHER): Payer: Medicare Other | Admitting: Acute Care

## 2018-09-11 ENCOUNTER — Other Ambulatory Visit: Payer: Self-pay

## 2018-09-11 ENCOUNTER — Encounter: Payer: Self-pay | Admitting: Acute Care

## 2018-09-11 VITALS — BP 130/58 | HR 91 | Temp 98.2°F | Ht 62.0 in | Wt 131.4 lb

## 2018-09-11 DIAGNOSIS — J189 Pneumonia, unspecified organism: Secondary | ICD-10-CM

## 2018-09-11 DIAGNOSIS — I2510 Atherosclerotic heart disease of native coronary artery without angina pectoris: Secondary | ICD-10-CM | POA: Diagnosis not present

## 2018-09-11 DIAGNOSIS — R0609 Other forms of dyspnea: Secondary | ICD-10-CM

## 2018-09-11 DIAGNOSIS — R0600 Dyspnea, unspecified: Secondary | ICD-10-CM | POA: Insufficient documentation

## 2018-09-11 DIAGNOSIS — M349 Systemic sclerosis, unspecified: Secondary | ICD-10-CM | POA: Diagnosis not present

## 2018-09-11 DIAGNOSIS — K219 Gastro-esophageal reflux disease without esophagitis: Secondary | ICD-10-CM | POA: Insufficient documentation

## 2018-09-11 DIAGNOSIS — J329 Chronic sinusitis, unspecified: Secondary | ICD-10-CM | POA: Diagnosis not present

## 2018-09-11 MED ORDER — ALBUTEROL SULFATE HFA 108 (90 BASE) MCG/ACT IN AERS: 1.0000 | INHALATION_SPRAY | Freq: Four times a day (QID) | RESPIRATORY_TRACT | 3 refills | Status: DC | PRN

## 2018-09-11 NOTE — Assessment & Plan Note (Addendum)
  Multifocal pneumonia Per speech silent aspiration Slow to resolve  Plan: Continue using the recommendation that the speech therapist gave you to avoid aspiration. Do not go to bed for at least an hour after you eat. Make sure you do not rush while you are eating. Flutter valve IS Follow up in 3 months   with [name] or Dr. Halford Chessman. OK  for Dr. Estill Cotta.( Hospice)  To  start augmentin as needed for recurrent aspiration pneumonia. Please contact office for sooner follow up if symptoms do not improve or worsen or seek emergency care

## 2018-09-11 NOTE — Assessment & Plan Note (Addendum)
GERD Continue Nexium 40 mg daily Add pepcid 20 mg at bedtime. Take 1 hour before bed. Sleep with head elevated  ( Pt. Now has a hospital bed and can sleep with head elevated to prevent silent aspiration of secretions)

## 2018-09-11 NOTE — Assessment & Plan Note (Signed)
Continue prednisone 5mg daily

## 2018-09-11 NOTE — Assessment & Plan Note (Addendum)
CT Sinus is normal Improving symptoms Finished Augmentin 08/09/2018>> Her second round Concern for multiple rounds of antibiotics and risk of c diff Plan: Continue Singulair 10 mg daily Continue prednisone 5 mg daily Continue Zyrtec daily Flonase 2 sprays each nare once daily Nasal saline as needed for congestion Follow up with Dr. Lucia Gaskins Follow up with Dr. Halford Chessman or [name] in 3 months Please contact office for sooner follow up if symptoms do not improve or worsen or seek emergency care

## 2018-09-11 NOTE — Patient Instructions (Addendum)
Multifocal pneumonia Per speech silent aspiration Plan:  Continue using the recommendation that the speech therapist gave you to avoid aspiration. Do not go to bed for at least an hour after you eat. Make sure you do not rush while you are eating. Continue using flutter valve Follow up with Dr. Halford Chessman in 3 months Please contact office for sooner follow up if symptoms do not improve or worsen or seek emergency care.    Recurrent sinus infections Continued improvement with maintenance therapy CT sinus normal Plan: Please schedule the follow up with Dr. Lucia Gaskins in future Continue Singulair 10 mg daily Continue prednisone 5 mg daily Continue Zyrtec daily Flonase 2 sprays each nare once daily Nasal saline as needed for congestion Follow up with Dr. Halford Chessman or [name] in 3 months OK to cancel 3/27 appointment with Dr. Halford Chessman   GERD Continue Nexium 40 mg daily Add pepcid 20 mg at bedtime. Take 1 hour before bed. Sleep with head elevated  ( Pt. Now has a hospital bed and can sleep with head elevated to prevent silent aspiration of secretions)  Exertional Dyspnea We will prescribe a rescue inhaler ( ProAir HFA ) Use as needed for shortness of breath or wheezing up to 4 times daily. If you are using in 4 times daily please come in to see [place].   Coronavirus (COVID-19) Are you at risk?   What to do if you are LOW RISK for COVID-19?  Reduce your risk of any infection by using the same precautions used for avoiding the common cold or flu:  [name] Wash your hands often with soap and warm water for at least 20 seconds.  If soap and water are not readily available, use an alcohol-based hand sanitizer with at least 60% alcohol.  . If coughing or sneezing, cover your mouth and nose by coughing or sneezing into the elbow areas of your shirt or coat, into a tissue or into your sleeve (not your hands). . Avoid shaking hands with others and consider head nods or verbal greetings only. . Avoid  touching your eyes, nose, or mouth with unwashed hands.  . Avoid close contact with people who are sick. . Avoid places or events with large numbers of people in one location, like concerts or sporting events. . Carefully consider travel plans you have or are making. . If you are planning any travel outside or inside the [place], visit the CDC's Travelers' Health webpage for the latest health notices. . If you have some symptoms but not all symptoms, continue to monitor at home and seek medical attention if your symptoms worsen. . If you are having a medical emergency, call 911.   [place] / e-Visit: [email or web address]         [facility]: [facility]: [phone]                   [facility]: [phone]

## 2018-09-11 NOTE — Progress Notes (Signed)
History of Present Illness [name] is a 85 y.o. 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 She is a patient of Dr. Halford Chessman.  ZOX:[id number] CREST(managed by Dr. Luciano Cutter sinusitis (managed by Dr. Lucia Gaskins with ENT) Smoker/ Smoking History:Former smoker. 40-pack-year smoking history. Maintenance:5 mg prednisonedaily  Recent Admission 12/23-12/30 For Pneumonia  09/11/2018  2 week follow up.  Pt. Presents for follow up. She continues to improve.She states she has been doing well. CT sinus was normal, and she has had fewer sinus symptoms.She is compliant with her maintenance regimen.She does still have an occasional cough. She is compliant with her flutter valve and IS. She did follow up with our cardiology referral.She  and was diagnosed with stage II diastolic dysfunction. She was started on Lasix 20 mg daily, and referred to the HTN clinic. She is scheduled to see Dr. Harrington Challenger 4/17. [name] Her HTN has not been well controlled. We are hopeful addition of  diuretic will improve her BP and her dyspnea..She states her appetite is better. She is compliant with her GERD regimen.She has lost 10 pounds over the last  3 months. She feels this is due to her hospitalization. She states she is currently maintaining her weight.She is drinking a protein drink as supplement.She denies fever, chest pain, orthopnea or hemoptysis. She does complain of exertional dyspnea. She is very active. She continues to steam her floors and maintain her own home.She is legally blind and is only limited by her ability to see. She is open to trying a rescue inhaler. She told me that she is working on taking her time to eat and that she has not had as many episodes of coughing while eating.   Test Results:  08/29/2018 CT Sinus Clear Sinuses  Rightward nasal septal deviation and a left concha bullosa are noted.   08/28/2018 CXR Mildly improved right upper lobe pneumonia. Stable changes of COPD and chronic bronchitis with mild pulmonary vascular congestion.  06/22/2018-chest x-ray-progressive infiltrates in the right lung consistent with pneumonia, new pulmonary vascular congestion interstitial edema and small effusions suggesting congestive heart failure  07/09/2017-CT Angio-negative for acute PE, mild groundglass density in the posterior right upper lobe could relate to minimal residual inflammation or infiltrate  CBC Latest Ref Rng & Units 08/07/2018 08/03/2018 08/02/2018  WBC 4.0 - 10.5 K/uL 11.3(H) 7.4 9.8  Hemoglobin 12.0 - 15.0 g/dL 10.9(L) 10.0(L) 10.8(L)  Hematocrit 36.0 - 46.0 % 33.7(L) 31.1(L) 33.6(L)  Platelets 150.0 - 400.0 K/uL 331.0 231 229    BMP Latest Ref Rng & Units 08/07/2018 08/03/2018 08/02/2018  Glucose 70 - 99 mg/dL 161(H) 215(H) 190(H)  BUN 6 - 23 mg/dL 16 10 7(L)  Creatinine 0.40 - 1.20 mg/dL 0.69 0.69 0.59  BUN/Creat Ratio 12 - 28 - - -  Sodium 135 - 145 mEq/L 131(L) 137 135  Potassium 3.5 - 5.1 mEq/L 3.9 3.4(L) 3.3(L)  Chloride 96 - 112 mEq/L 93(L) 101 102  CO2 19 - 32 mEq/L 31 22 24   Calcium 8.4 - 10.5 mg/dL 8.6 8.0(L) 8.0(L)    BNP    Component Value Date/Time   BNP 214.7 (H) 07/13/2017 1047    ProBNP    Component Value Date/Time   PROBNP 150.0 (H) 02/02/2015 1156    PFT No results found for: FEV1PRE, FEV1POST, FVCPRE, FVCPOST, TLC, DLCOUNC, PREFEV1FVCRT, PSTFEV1FVCRT  Dg Chest 2 View  Result Date: 08/28/2018  CLINICAL DATA:  Follow-up pneumonia. EXAM: CHEST - 2 VIEW COMPARISON:  08/07/2018. FINDINGS: Normal sized heart. Mild decrease in patchy right upper lobe airspace opacity with mild residual opacity. The lungs remain hyperexpanded with mild prominence of the pulmonary vasculature and interstitial markings with mild peribronchial thickening. Thoracic spine degenerative changes. Left  lower neck surgical clips. IMPRESSION: 1. Mildly improved right upper lobe pneumonia. 2. Stable changes of COPD and chronic bronchitis with mild pulmonary vascular congestion. Electronically Signed   By: Claudie Revering M.D.   On: 08/28/2018 16:06   Ct Maxillofacial Ltd Wo Cm  Result Date: 08/29/2018 CLINICAL DATA:  Recurrent sinusitis. EXAM: CT PARANASAL SINUS LIMITED WITHOUT CONTRAST TECHNIQUE: Non-contiguous multidetector CT images of the paranasal sinuses were obtained in a single plane without contrast. COMPARISON:  Head CT 07/30/2018 FINDINGS: The visualized portions of the paranasal sinuses are clear without evidence of significant mucosal thickening or fluid. Rightward nasal septal deviation and a left concha bullosa are noted. The visualized mastoid air cells are clear. Bilateral cataract extraction and cerebral atrophy are noted. IMPRESSION: Clear sinuses. Electronically Signed   By: Logan Bores M.D.   On: 08/29/2018 16:00     Past medical hx Past Medical History:  Diagnosis Date  . Adenomatous colon polyp   . Arthritis   . Blood transfusion   . Cancer [facility])    cervical cancer pre hysterectomy  . Cervical disc disease   . Chronic sinusitis   . CONSTIPATION, CHRONIC 06/26/2007   Qualifier: Diagnosis of  By: Nils Pyle CMA ([place]), Mearl Latin    . COPD (chronic obstructive pulmonary disease) ([place])   . Coronary artery disease    prior MI with stenting of RCA with repeat PTCA/stenting for ISR in 01/2008, stent to LM 11/2008  . CVA (cerebral infarction) 1991  . Depression   . Diabetes mellitus   . Diverticulosis   . 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 Dyslipidemia   . Emphysema   . Fibromyalgia   . GERD (gastroesophageal reflux disease)   . Hypertension   . Hypothyroidism   . IBS (irritable bowel syndrome)   . Legally blind   . Macular degeneration   . Myocardial infarction [facility]) may 27th 2007  . Pneumonia 07/16/2013    HX PNEUMONIA  . Pulmonary hypertension ([place])   . Raynaud's phenomenon    . Scleroderma ([place])   . Sjogren's disease ([place])   . Sleep apnea    STOP BANG SCORE 5  . Stroke [facility]) 1990   brain stem stroke - weakness rt hand  . Zenker's diverticulum      Social History   Tobacco Use  . Smoking status: Former Smoker    Packs/day: 1.00    Years: 40.00    Pack years: 40.00    Types: Cigarettes    Last attempt to quit: 06/26/1988    Years since quitting: 30.2  . Smokeless tobacco: Never Used  . Tobacco comment: 40 pack years  Substance Use Topics  . Alcohol use: No  . Drug use: No    [name] reports that she quit smoking about 30 years ago. Her smoking use included cigarettes. She has a 40.00 pack-year smoking history. She has never used smokeless tobacco. She reports that she does not drink alcohol or use drugs.  Tobacco Cessation: Former smoker, quit 1990, with a 40 pack year smoking history  Past surgical hx, Family hx, Social hx all reviewed.  Current Outpatient Medications on File Prior to Visit  Medication Sig  . aspirin  EC 81 MG tablet Take 1 tablet (81 mg total) by mouth daily.  . cetirizine (ZYRTEC) 10 MG tablet Take 10 mg by mouth daily as needed for allergies.  [name] esomeprazole (NEXIUM) 40 MG capsule Take 40 mg by mouth daily.   . fluticasone (FLONASE) 50 MCG/ACT nasal spray Place 1 spray into both nostrils daily.  . furosemide (LASIX) 20 MG tablet Take 1 tablet (20 mg total) by mouth daily.  [name] HYDROcodone-acetaminophen (NORCO) 7.5-325 MG tablet Take 1 tablet by mouth every 6 (six) hours as needed for moderate pain.  [name] imipramine (TOFRANIL) 25 MG tablet Take 75 mg by mouth at bedtime.   . insulin lispro (HUMALOG KWIKPEN) 100 UNIT/ML KwikPen Inject 5 Units into the skin 3 (three) times daily. Inject 6 units into the skin with breakfast, 6 units with lunch, and 5 units with supper  . levothyroxine (SYNTHROID, LEVOTHROID) 100 MCG tablet Take 100 mcg by mouth daily before breakfast.  . montelukast (SINGULAIR) 10 MG tablet Take 10 mg by mouth at  bedtime.   . Naphazoline-Pheniramine (OPCON-A) 0.027-0.315 % SOLN Place 1-2 drops into both eyes as needed (for allergies).  . predniSONE (DELTASONE) 5 MG tablet TAKE 1 TABLET ONCE A DAY WITH BREAKFAST  . triazolam (HALCION) 0.25 MG tablet Take 0.25 mg by mouth at bedtime as needed for sleep.    No current facility-administered medications on file prior to visit.      Allergies  Allergen Reactions  . Flomax [Tamsulosin Hcl] Other (See Comments)    Made her 'trachea feel tight and couldn't swallow'  . Peanut Oil Anaphylaxis, Swelling, Rash and Other (See Comments)    Throat tightens, also  . Sulfa Antibiotics Anaphylaxis       . Titanium Other (See Comments)    Neck wouldn't heal with titanium cervical plate  . Yellow Dye Other (See Comments)    "Bee stings" all over body when eating/taking medications with red or yellow dye   . Lantus [Insulin Glargine] Other (See Comments)    Patient states "sulfate binder causes sinus infection/pneumonia".  [place] [Levofloxacin In D5w] Other (See Comments)    Muscle aches  . Metoprolol-Hydrochlorothiazide Other (See Comments)    Decreased B/P  . Norvasc [Amlodipine] Swelling and Other (See Comments)    BIL Ankle edema  . Novolog [Insulin Aspart]     Patient states "sulfate binder causes sinus infection/pneumonia". Tolerates Humalog.  [name] Penciclovir Other (See Comments)    Muscle Pain  . Senna [Sennosides] Other (See Comments)    Pt states that it causes a "stinging" sensation  . Statins Other (See Comments)    All of them make me "feel badly"  . Sulfonamide Derivatives Hives  . Adhesive [Tape] Hives and Rash  . Maxidex [Dexamethasone] Anxiety and Other (See Comments)    Insomnia and dizziness, too    Review Of Systems:  Constitutional:   No  weight loss, night sweats,  Fevers, chills,+ fatigue, or  lassitude.  HEENT:   No headaches,  Difficulty swallowing,  Tooth/dental problems, or  Sore throat,                No sneezing,  itching, ear ache,+  nasal congestion, post nasal drip,   CV:  No chest pain,  Orthopnea, PND, swelling in lower extremities, anasarca, dizziness, palpitations, syncope.   GI  No heartburn, indigestion, abdominal pain, nausea, vomiting, diarrhea, change in bowel habits, + loss of appetite, No bloody stools.   Resp: +  shortness of breath  with exertion none at rest.  No excess mucus, no productive cough,  No non-productive cough,  No coughing up of blood.  No change in color of mucus.  No wheezing.  No chest wall deformity  Skin: no rash or lesions.  GU: no dysuria, change in color of urine, no urgency or frequency.  No flank pain, no hematuria   MS:  No joint pain or swelling.  No decreased range of motion.  No back pain.  Psych:  No change in mood or affect. No depression or anxiety.  No memory loss.   Vital Signs BP (!) 130/58 (BP Location: Left Arm, Patient Position: Sitting, Cuff Size: Normal)   Pulse 91   Temp 98.2 F (36.8 C) (Oral)   Ht 5\' 2"  (1.575 m)   Wt 131 lb 6.4 oz (59.6 kg)   SpO2 96%   BMI 24.03 kg/m    Physical Exam:  General- No distress,  A&Ox3, pleasant ENT: No sinus tenderness, TM clear, pale nasal mucosa, no oral exudate,+ post nasal drip, no LAN Cardiac: S1, S2, regular rate and rhythm, no murmur Chest: No wheeze/ rales/ dullness; no accessory muscle use, no nasal flaring, no sternal retractions, diminished per bases Abd.: Soft Non-tender, ND, BS + Ext: No clubbing cyanosis, edema Neuro:  normal strength, MAE x 4, A&O x 3, decreased muscle tone. Skin: No rashes, warm and dry Psych: normal mood and behavior   Assessment/Plan  Multifocal pneumonia  Multifocal pneumonia Per speech silent aspiration Slow to resolve  Plan: Continue using the recommendation that the speech therapist gave you to avoid aspiration. Do not go to bed for at least an hour after you eat. Make sure you do not rush while you are eating. Flutter valve IS Follow up in 3  months   with [name] or Dr. Halford Chessman. OK  for Dr. Estill Cotta.( Hospice)  To  start augmentin as needed for recurrent aspiration pneumonia. Please contact office for sooner follow up if symptoms do not improve or worsen or seek emergency care       Recurrent sinus infections CT Sinus is normal Improving symptoms Finished Augmentin 08/09/2018>> Her second round Concern for multiple rounds of antibiotics and risk of c diff Plan: Continue Singulair 10 mg daily Continue prednisone 5 mg daily Continue Zyrtec daily Flonase 2 sprays each nare once daily Nasal saline as needed for congestion Follow up with Dr. Lucia Gaskins Follow up with Dr. Halford Chessman or [name] in 3 months Please contact office for sooner follow up if symptoms do not improve or worsen or seek emergency care       GERD (gastroesophageal reflux disease) GERD Continue Nexium 40 mg daily Add pepcid 20 mg at bedtime. Take 1 hour before bed. Sleep with head elevated  ( Pt. Now has a hospital bed and can sleep with head elevated to prevent silent aspiration of secretions)      Dyspnea Dyspnea on exertion Plan We will prescribe a rescue inhaler  Use 1 puff up to 3 times daily for shortness of breath or wheezing We will schedule PFT's at follow up in 3 months  if she continues to improve  Scleroderma (HCC) Continue prednisone 5 mg daily    Magdalen Spatz, NP 09/11/2018  10:19 PM

## 2018-09-11 NOTE — Assessment & Plan Note (Signed)
Dyspnea on exertion Plan We will prescribe a rescue inhaler  Use 1 puff up to 3 times daily for shortness of breath or wheezing We will schedule PFT's at follow up in 3 months  if she continues to improve

## 2018-09-18 ENCOUNTER — Ambulatory Visit: Payer: 59

## 2018-09-20 ENCOUNTER — Ambulatory Visit: Payer: 59 | Admitting: Pulmonary Disease

## 2018-10-03 ENCOUNTER — Telehealth: Payer: Self-pay | Admitting: Pharmacist

## 2018-10-03 NOTE — Telephone Encounter (Signed)
Called to cancel in office HTN clinic visit. She has an appt with Dr. Harrington Challenger for a few days later who can address concerns and refer back if needed.   Of note pt did say it will likely have to be phone call with pt as she does not have Internet at home. She is not having any acute concerns at the moment.   Will route to Dr. Alan Ripper RN to make aware of virtual needs. PharmD can call pt for visit if Dr. Harrington Challenger defers, please just make [place] aware.

## 2018-10-08 ENCOUNTER — Ambulatory Visit: Payer: Medicare Other

## 2018-10-10 ENCOUNTER — Telehealth: Payer: Self-pay | Admitting: Internal Medicine

## 2018-10-10 NOTE — Telephone Encounter (Signed)
Good afternoon,    Pt. Gave verbal consent. I told her to expect a call in he morning between 7:30 and 8 from [name] to go over her meds and also told her that if she had HR, BP or any weight changes available to get that ready for tomorrow.

## 2018-10-11 ENCOUNTER — Encounter: Payer: Self-pay | Admitting: Internal Medicine

## 2018-10-11 ENCOUNTER — Other Ambulatory Visit: Payer: Self-pay

## 2018-10-11 ENCOUNTER — Telehealth (INDEPENDENT_AMBULATORY_CARE_PROVIDER_SITE_OTHER): Payer: Medicare Other | Admitting: Internal Medicine

## 2018-10-11 VITALS — BP 159/72 | HR 85 | Temp 97.9°F | Ht 62.0 in | Wt 127.0 lb

## 2018-10-11 DIAGNOSIS — I2510 Atherosclerotic heart disease of native coronary artery without angina pectoris: Secondary | ICD-10-CM | POA: Diagnosis not present

## 2018-10-11 DIAGNOSIS — E782 Mixed hyperlipidemia: Secondary | ICD-10-CM

## 2018-10-11 DIAGNOSIS — I10 Essential (primary) hypertension: Secondary | ICD-10-CM

## 2018-10-11 MED ORDER — AMLODIPINE BESYLATE 2.5 MG PO TABS: 2.5000 mg | ORAL_TABLET | Freq: Every day | ORAL | 3 refills | Status: DC

## 2018-10-11 NOTE — Patient Instructions (Addendum)
Medication Instructions:  Your physician has recommended you make the following change in your medication: 1.) start amlodipine 2.5 mg once a day for blood pressure   If you need a refill on your cardiac medications before your next appointment, please call your pharmacy.   Lab work: None  Testing/Procedures: none  Follow-Up: Dr. Harrington Challenger will call you on Monday 10/14/18  Any Other Special Instructions Will Be Listed Below (If Applicable). Called patient and reviewed that she is starting amlodipine 2.5 mg once a day. Her grandson will try to pick up today, otherwise, deliveries from [facility] are [place]. Pt will take one extra Lasix tablet on Sat 4/18 for a total dose that day of 40 mg.

## 2018-10-11 NOTE — Progress Notes (Signed)
Telephone VISIT  Evaluation Performed:  Follow-up visit  Date:  10/11/2018   ID:  [name], DOB 1930/09/21, MRN [id number] Seen agai Patient Location: Home Provider Location: Home  PCP:  Merrilee Seashore, MD  Cardiologist:  Dorris Carnes, MD  Electrophysiologist:  None   Chief Complaint: Follow up of CAD   History of Present Illness:    [name] is a 85 y.o. female with hx of CAD (s/p MI  Stent to RCA   Repeat PTCA/stent for ISR in 2009; Stent to LM in 2010)  Pt also With hx of Dressler's syndrom, BOOP, diverticulitis, connective tissue dz, sleep apnea, HL, HTL, CVA (1990s), anemia, depression, GERD,blindness.   Last cath in 2011 (LAD 40%  Diag 30%; Lcx 50%; OM3 50%; OM1 80% (small; RCA 20% instent; dRCA 30%).  Normal stress test March 2019 Pt admitted in Dec with sepsis.    She was last in cardiology clinic in March 2020  Seen by Buren Kos   Since seen she days her breathing has been OK   No edema   Does not eat salt.  On lasix 20 daily   Says her abdomen is a little distended. BP at home is labile   150s  Mainly    Denies CP    She denies fevers, chills or cough   No symptoms of COVID 19 infeciton  The patientdoes not have symptoms concerning for COVID-19 infection (fever, chills, cough, or new shortness of breath).    Past Medical History:  Diagnosis Date  . Adenomatous colon polyp   . Arthritis   . Blood transfusion   . Cancer [facility])    cervical cancer pre hysterectomy  . Cervical disc disease   . Chronic sinusitis   . CONSTIPATION, CHRONIC 06/26/2007   Qualifier: Diagnosis of  By: Nils Pyle CMA ([place]), Mearl Latin    . COPD (chronic obstructive pulmonary disease) ([place])   . Coronary artery disease    prior MI with stenting of RCA with repeat PTCA/stenting for ISR in 01/2008, stent to LM 11/2008  . CVA (cerebral infarction) 1991  . Depression   . Diabetes mellitus   . Diverticulosis   . 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 Dyslipidemia   . Emphysema   .  Fibromyalgia   . GERD (gastroesophageal reflux disease)   . Hypertension   . Hypothyroidism   . IBS (irritable bowel syndrome)   . Legally blind   . Macular degeneration   . Myocardial infarction [facility]) may 27th 2007  . Pneumonia 07/16/2013    HX PNEUMONIA  . Pulmonary hypertension ([place])   . Raynaud's phenomenon   . Scleroderma ([place])   . Sjogren's disease ([place])   . Sleep apnea    STOP BANG SCORE 5  . Stroke [facility]) 1990   brain stem stroke - weakness rt hand  . Zenker's diverticulum    Past Surgical History:  Procedure Laterality Date  . APPENDECTOMY    . BALLOON DILATION N/A 07/15/2015   Procedure: BALLOON DILATION;  Surgeon: Milus Banister, MD;  Location: [facility];  Service: Endoscopy;  Laterality: N/A;  . BILATERAL OOPHORECTOMY  2002  . BRONCHOSCOPY  02-22-09  . CARDIAC CATHETERIZATION     x 3  . CATARACT EXTRACTION    . CHOLECYSTECTOMY  1965  . COLON SURGERY  2014   colon resection  . colonectomy    . ESOPHAGOGASTRODUODENOSCOPY (EGD) WITH ESOPHAGEAL DILATION N/A 06/18/2013   Procedure: ESOPHAGOGASTRODUODENOSCOPY (EGD) WITH ESOPHAGEAL DILATION;  Surgeon: Sandy Salaam  Deatra Ina, MD;  Location: [place];  Service: Endoscopy;  Laterality: N/A;  . FLEXIBLE SIGMOIDOSCOPY N/A 07/15/2015   Procedure: FLEXIBLE SIGMOIDOSCOPY;  Surgeon: Milus Banister, MD;  Location: [facility];  Service: Endoscopy;  Laterality: N/A;  . lobe thyroid removal    . PROCTOSCOPY  03/18/2012   Procedure: PROCTOSCOPY;  Surgeon: Adin Hector, MD;  Location: [facility];  Service: General;  Laterality: N/A;  rigid procto  . RADIOLOGY WITH ANESTHESIA N/A 03/09/2015   Procedure: MRI CERVICAL SPINE WITHOUT AND LUMBER WITHOUT;  Surgeon: Medication Radiologist, MD;  Location: [place];  Service: Radiology;  Laterality: N/A;  . [place]   and implantation of a plate   . STENTED CARDIAC  ARTERY  2007 / 2007 / 2010   X 3 STENT  . THYROID LOBECTOMY  1991   removal of left lobe thyroid  . TOTAL ABDOMINAL  HYSTERECTOMY  1973     Current Meds  Medication Sig  . aspirin EC 81 MG tablet Take 1 tablet (81 mg total) by mouth daily.  . cetirizine (ZYRTEC) 10 MG tablet Take 10 mg by mouth daily as needed for allergies.  [name] esomeprazole (NEXIUM) 40 MG capsule Take 40 mg by mouth daily.   . fluticasone (FLONASE) 50 MCG/ACT nasal spray Place 1 spray into both nostrils daily.  . furosemide (LASIX) 20 MG tablet Take 1 tablet (20 mg total) by mouth daily.  [name] HYDROcodone-acetaminophen (NORCO) 7.5-325 MG tablet Take 1 tablet by mouth every 6 (six) hours as needed for moderate pain.  [name] imipramine (TOFRANIL) 25 MG tablet Take 75 mg by mouth at bedtime.   . insulin lispro (HUMALOG KWIKPEN) 100 UNIT/ML KwikPen Inject 5 Units into the skin 3 (three) times daily. Inject 6 units into the skin with breakfast, 6 units with lunch, and 5 units with supper  . levothyroxine (SYNTHROID, LEVOTHROID) 100 MCG tablet Take 100 mcg by mouth daily before breakfast.  . montelukast (SINGULAIR) 10 MG tablet Take 10 mg by mouth at bedtime.   . Naphazoline-Pheniramine (OPCON-A) 0.027-0.315 % SOLN Place 1-2 drops into both eyes as needed (for allergies).  . predniSONE (DELTASONE) 5 MG tablet TAKE 1 TABLET ONCE A DAY WITH BREAKFAST  . triazolam (HALCION) 0.25 MG tablet Take 0.25 mg by mouth at bedtime as needed for sleep.      Allergies:   Flomax [tamsulosin hcl]; Peanut oil; Sulfa antibiotics; Titanium; Yellow dye; Lantus [insulin glargine]; Levaquin [levofloxacin in d5w]; Metoprolol-hydrochlorothiazide; Norvasc [amlodipine]; Novolog [insulin aspart]; Penciclovir; Senna [sennosides]; Statins; Sulfonamide derivatives; Adhesive [tape]; and Maxidex [dexamethasone]   Social History   Tobacco Use  . Smoking status: Former Smoker    Packs/day: 1.00    Years: 40.00    Pack years: 40.00    Types: Cigarettes    Last attempt to quit: 06/26/1988    Years since quitting: 30.3  . Smokeless tobacco: Never Used  . Tobacco comment: 40 pack years   Substance Use Topics  . Alcohol use: No  . Drug use: No     Family Hx: The patient's family history includes Cancer in her brother and sister; Diabetes in her brother, brother, father, and sister; Heart disease in her father; Other (age of onset: 71) in her mother; Stroke (age of onset: 20) in her father.  ROS:   Please see the history of present illness.   All other systems reviewed and are negative.   Prior CV studies:   The following studies were reviewed today:  Labs/Other Tests  and Data Reviewed:    EKG:  No EKG done as tele visit   Recent Labs: 07/30/2018: TSH 1.016 08/03/2018: ALT 18; Magnesium 1.8 08/07/2018: BUN 16; Creatinine, Ser 0.69; Hemoglobin 10.9; Platelets 331.0; Potassium 3.9; Sodium 131   Recent Lipid Panel Lab Results  Component Value Date/Time   CHOL 226 (H) 07/14/2017 08:42 AM   TRIG 154 (H) 07/14/2017 08:42 AM   HDL 55 07/14/2017 08:42 AM   CHOLHDL 4.1 07/14/2017 08:42 AM   LDLCALC 140 (H) 07/14/2017 08:42 AM   LDLDIRECT 127.6 05/15/2013 02:22 PM    Wt Readings from Last 3 Encounters:  10/11/18 127 lb (57.6 kg)  09/11/18 131 lb 6.4 oz (59.6 kg)  09/03/18 131 lb 1.9 oz (59.5 kg)     Objective:    Vital Signs:  BP (!) 159/72   Pulse 85   Temp 97.9 F (36.6 C)   Ht 5\' 2"  (1.575 m)   Wt 127 lb (57.6 kg)   BMI 23.23 kg/m    Exam not performed as televist   Due to COVID 19  ASSESSMENT & PLAN:    1. CAD   No symptoms to sugg angina  2   HTN   BP is up   I would add amlodipine 2.5 to regimen   SHe has taken in past   I have also asked her to take 2 lasix tomorrow with abdominal swelling     3   Hx diastolic dysfunction   Lasix as noted above  4  HL   Intolerant to statins   Watch diet    Will contact pt next week to assess response    5  COVID-19 Education: The signs and symptoms of COVID-19 were discussed with the patient and how to seek care for testing  The importance of social distancing was discussed today.  Time:   Today, I have  spent 25 minutes with the patient with telehealth technology discussing the above problems.     Medication Adjustments/Labs and Tests Ordered: Current medicines are reviewed at length with the patient today.  Concerns regarding medicines are outlined above.   Tests Ordered: No orders of the defined types were placed in this encounter.   Medication Changes: No orders of the defined types were placed in this encounter.   Disposition:  Follow up on Monday with response   Signed, Dorris Carnes, MD  10/11/2018 8:38 AM    [place]

## 2018-10-21 DIAGNOSIS — D696 Thrombocytopenia, unspecified: Secondary | ICD-10-CM | POA: Diagnosis not present

## 2018-10-21 DIAGNOSIS — E039 Hypothyroidism, unspecified: Secondary | ICD-10-CM | POA: Diagnosis not present

## 2018-10-21 DIAGNOSIS — I10 Essential (primary) hypertension: Secondary | ICD-10-CM | POA: Diagnosis not present

## 2018-10-21 DIAGNOSIS — E1065 Type 1 diabetes mellitus with hyperglycemia: Secondary | ICD-10-CM | POA: Diagnosis not present

## 2018-10-24 DIAGNOSIS — J449 Chronic obstructive pulmonary disease, unspecified: Secondary | ICD-10-CM | POA: Diagnosis not present

## 2018-10-24 DIAGNOSIS — E1065 Type 1 diabetes mellitus with hyperglycemia: Secondary | ICD-10-CM | POA: Diagnosis not present

## 2018-10-24 DIAGNOSIS — M349 Systemic sclerosis, unspecified: Secondary | ICD-10-CM | POA: Diagnosis not present

## 2018-10-24 DIAGNOSIS — I10 Essential (primary) hypertension: Secondary | ICD-10-CM | POA: Diagnosis not present

## 2018-10-24 DIAGNOSIS — I2510 Atherosclerotic heart disease of native coronary artery without angina pectoris: Secondary | ICD-10-CM | POA: Diagnosis not present

## 2018-10-24 DIAGNOSIS — E039 Hypothyroidism, unspecified: Secondary | ICD-10-CM | POA: Diagnosis not present

## 2018-10-24 DIAGNOSIS — E782 Mixed hyperlipidemia: Secondary | ICD-10-CM | POA: Diagnosis not present

## 2018-10-24 DIAGNOSIS — K5730 Diverticulosis of large intestine without perforation or abscess without bleeding: Secondary | ICD-10-CM | POA: Diagnosis not present

## 2018-10-24 DIAGNOSIS — I700 Atherosclerosis of aorta: Secondary | ICD-10-CM | POA: Diagnosis not present

## 2018-10-24 DIAGNOSIS — Z0000 Encounter for general adult medical examination without abnormal findings: Secondary | ICD-10-CM | POA: Diagnosis not present

## 2018-10-24 DIAGNOSIS — J4520 Mild intermittent asthma, uncomplicated: Secondary | ICD-10-CM | POA: Diagnosis not present

## 2018-10-24 DIAGNOSIS — D500 Iron deficiency anemia secondary to blood loss (chronic): Secondary | ICD-10-CM | POA: Diagnosis not present

## 2018-11-26 ENCOUNTER — Ambulatory Visit: Payer: 59 | Admitting: Acute Care

## 2018-11-26 ENCOUNTER — Telehealth: Payer: Self-pay | Admitting: Acute Care

## 2018-11-26 NOTE — Telephone Encounter (Signed)
Primary Pulmonologist: Sood Last office visit and with whom: 3.18.2020 w/ [name] What do we see them for (pulmonary problems): PNA, Scleroderma, COPD Last OV assessment/plan: Multifocal pneumonia Per speech silent aspiration Plan:  Continue using the recommendation that the speech therapist gave you to avoid aspiration. Do not go to bed for at least an hour after you eat. Make sure you do not rush while you are eating. Continue using flutter valve Follow up with Dr. Halford [name] in 3 months Please contact office for sooner follow up if symptoms do not improve or worsen or seek emergency care.       Recurrent sinus infections Continued improvement with maintenance therapy CT sinus normal Plan: Please schedule the follow up with Dr. Lucia [name] in future Continue Singulair 10 mg daily Continue prednisone 5 mg daily Continue Zyrtec daily Flonase 2 sprays each nare once daily Nasal saline as needed for congestion Follow up with Dr. Halford [name] or [name] in 3 months OK to cancel 3/27 appointment with Dr. Halford [name]     GERD Continue Nexium 40 mg daily Add pepcid 20 mg at bedtime. Take 1 hour before bed. Sleep with head elevated  ( Pt. Now has a hospital bed and can sleep with head elevated to prevent silent aspiration of secretions)   Exertional Dyspnea We will prescribe a rescue inhaler ( ProAir HFA ) Use as needed for shortness of breath or wheezing up to 4 times daily. If you are using in 4 times daily please come in to see [place].    Was appointment offered to patient (explain)?  Yes >> scheduled to see [name] 11/26/18 at 1430   Reason for call: spoke with patient's daughter [name] reports that patient's cardiologist added Norvasc 2.5mg  back to pt's regimen in April and feels that this has attributed to some ankle and knee discomfort since that visit.  For the past 1 week patient reports some increased SHOB.  Denies any cough, wheezing, tightness in chest.  Denies COVID symptoms of recent travel,  new SHOB, new cough, sore throat, f/c/s, n/v/d, abd pain, muscle aches, loss of taste or smell.  Daughter does report that patient ALWAYS maintains a low grade temp d/t her scleroderma.  Patient is unable to have video visit because patient does not have internet in her home.  Routing message to Renee Roch to make her aware and will change visit to telephone visit if [name] thinks this would be best with the low grade temp reportedly d/t patient's scleroderma.

## 2018-11-26 NOTE — Telephone Encounter (Signed)
[name], I called and spoke with patient and daughter on speaker phone.  I discussed your recommendations with them both in detail.  Patient adamantly refused to follow your recommendations for cxr, COVID testing and telephone visit stating that she does not have COVID and daughter thinks [facility] is from the ankle/knee pain.  Patient stated that if she "can't be seen in the office then forget about it."  Patient does not want to follow up cardiology for the ankle/knee pain that they believe is from the Norvasc because she did a telephone visit with that office and "it didn't go well and didn't help at all."  Just wanted to make you aware.

## 2018-11-26 NOTE — Telephone Encounter (Signed)
Jess, Please place an order for a CXR at [facility] in the radiology department. Please make it a stat. Also place an order for COVID testing at the drive through. Let her know she will be notified of when to go to have this done. Schedule the telephone visit this afternoon so I will hopefully have the CXR results back before the visit. Ugh, Covid has made everything so complicated.Thanks so much for your help.[name]

## 2018-11-26 NOTE — Telephone Encounter (Signed)
Noted  

## 2018-11-26 NOTE — Telephone Encounter (Signed)
Thanks so much [place].

## 2018-12-18 ENCOUNTER — Ambulatory Visit: Payer: Medicare Other | Admitting: Acute Care

## 2018-12-24 ENCOUNTER — Ambulatory Visit (INDEPENDENT_AMBULATORY_CARE_PROVIDER_SITE_OTHER): Payer: Medicare Other | Admitting: Pulmonary Disease

## 2018-12-24 ENCOUNTER — Telehealth: Payer: Self-pay | Admitting: Acute Care

## 2018-12-24 ENCOUNTER — Encounter: Payer: Self-pay | Admitting: Pulmonary Disease

## 2018-12-24 ENCOUNTER — Other Ambulatory Visit: Payer: Self-pay

## 2018-12-24 ENCOUNTER — Telehealth: Payer: Self-pay

## 2018-12-24 DIAGNOSIS — Z20822 Contact with and (suspected) exposure to covid-19: Secondary | ICD-10-CM

## 2018-12-24 DIAGNOSIS — R509 Fever, unspecified: Secondary | ICD-10-CM | POA: Diagnosis not present

## 2018-12-24 DIAGNOSIS — Z881 Allergy status to other antibiotic agents status: Secondary | ICD-10-CM | POA: Diagnosis not present

## 2018-12-24 DIAGNOSIS — J329 Chronic sinusitis, unspecified: Secondary | ICD-10-CM

## 2018-12-24 DIAGNOSIS — M349 Systemic sclerosis, unspecified: Secondary | ICD-10-CM

## 2018-12-24 MED ORDER — PREDNISONE 10 MG PO TABS: ORAL_TABLET | ORAL | 0 refills | Status: DC

## 2018-12-24 MED ORDER — AMOXICILLIN-POT CLAVULANATE 875-125 MG PO TABS: 1.0000 | ORAL_TABLET | Freq: Two times a day (BID) | ORAL | 0 refills | Status: DC

## 2018-12-24 NOTE — Assessment & Plan Note (Signed)
Assessment: Fever today, T-max 100.4 Currently managing with Tylenol  Plan: Complete SARS-CoV-2 testing scheduled for 12/25/2018 Consider evaluation in our office or chest x-ray after confirmed negative test Continue Tylenol as needed for fever management

## 2018-12-24 NOTE — Assessment & Plan Note (Signed)
Plan: We are very limited based off of patient's allergies to antibiotics We will treat with a second course of Augmentin for a longer course of 10 days Patient needs to be on probiotic

## 2018-12-24 NOTE — Assessment & Plan Note (Addendum)
Assessment: March/2020 CT sinuses negative History of recurrent sinus infections requiring Augmentin courses, typically multiple rounds Patient recently completed 7-day course of Augmentin on 12/22/2018, with some improvement in her symptoms Increased sinus pressure and pain Unable to follow-up with ENT at this time due to being closed for COVID-19 pandemic Unable to assess nasal drainage or sputum color due to being legally blind  Plan: Augmentin course for 10 days due to patient's allergies to yellow dye which limits doxycycline as well as allergies to fluoroquinolones Prednisone taper, patient to follow-up with primary care monitor blood sugars closely After prednisone taper resume daily 5 mg prednisone dose Follow-up with ENT when clinic opens back up Offered patient referral to establish with different ENT in the area she declined this today, will need to evaluate to ensure that Dr. Pollie Friar office that is reopening Nasal saline rinses twice daily Continue Zyrtec daily

## 2018-12-24 NOTE — Progress Notes (Signed)
Virtual Visit via Telephone Note  I connected with [name] on 12/24/18 at  4:30 PM EDT by telephone and verified that I am speaking with the correct person using two identifiers.  Location: Patient: Home Provider: [facility] - [address], [place], [place], [place] [address]   I discussed the limitations, risks, security and privacy concerns of performing an evaluation and management service by telephone and the availability of in person appointments. I also discussed with the patient that there may be a patient responsible charge related to this service. The patient expressed understanding and agreed to proceed.  Patient consented to consult via telephone: Yes People present and their role in pt care: Pt    History of Present Illness: 85 year old female former smoker followed in our office for abnormal CT (probable Boop), emphysema  PMH: Scleroderma (managed by Dr. Estanislado Pandy), chronic sinusitis (managed by Dr. Lucia Gaskins with ENT) Smoker/ Smoking History: Former smoker.  80-pack-year smoking history. Maintenance: 5 mg prednisone Pt of: Dr. Halford Chessman   Chief complaint: Fever, SOB, Cough, Sore throat, painful neck, sinus drainage, thrush  85 year old female former smoker followed in our office for emphysema and abnormal CT with probable Boop.  Patient has been managed closely in our office over the past couple of months.  She contacted our office in the beginning of March with acute symptoms and was recommended to be tested for SARS-CoV-2 and to have a chest x-ray once confirmed negative for SARS-CoV-2.  Patient did not complete this testing as instructed.  Patient then proceeded to travel out of state.  She returned back to [place] on 12/23/2018.  Patient has recently completed a 7-day course of Augmentin finishing on 12/22/2018 as well as prednisone 20 mg daily.  She reports that this was started for sinusitis.  She reports that the sinus symptoms did improve but  have not gone away completely and she started to have a fever today.  Patient has significant list of allergies.  Patient is also reporting that due to all the stress of the COVID-19 pandemic and her recent travel she started to have a fibromyalgia flare which she is managing with pain medications.  Patient is currently scheduled to have outpatient SARS-CoV-2 testing on 12/25/2018.  She is completing a tele-visit with our office today. Pt with history of of recurrent sinus infections.  March/2020 CT of sinuses negative.  She is typically followed by ENT Dr. Lucia Gaskins.  His office is currently closed to the COVID-19 pandemic.   Observations/Objective:  06/22/2018-chest x-ray-progressive infiltrates in the right lung consistent with pneumonia, new pulmonary vascular congestion interstitial edema and small effusions suggesting congestive heart failure  07/09/2017-CT Angio-negative for acute PE, mild groundglass density in the posterior right upper lobe could relate to minimal residual inflammation or infiltrate  08/29/2018-CT maxillofacial- no evidence of chronic sinusitis  06/18/2017-echocardiogram-LV ejection fraction 60 to 00%, grade 2 diastolic dysfunction  Assessment and Plan:  Recurrent sinus infections Assessment: March/2020 CT sinuses negative History of recurrent sinus infections requiring Augmentin courses, typically multiple rounds Patient recently completed 7-day course of Augmentin on 12/22/2018, with some improvement in her symptoms Increased sinus pressure and pain Unable to follow-up with ENT at this time due to being closed for COVID-19 pandemic Unable to assess nasal drainage or sputum color due to being legally blind  Plan: Augmentin course for 10 days due to patient's allergies to yellow dye which limits doxycycline as well as allergies to fluoroquinolones Prednisone taper, patient to follow-up with primary  care monitor blood sugars closely After prednisone taper resume daily 5 mg  prednisone dose Follow-up with ENT when clinic opens back up Offered patient referral to establish with different ENT in the area she declined this today, will need to evaluate to ensure that Dr. Pollie Friar office that is reopening Nasal saline rinses twice daily Continue Zyrtec daily    Fever and chills Assessment: Fever today, T-max 100.4 Currently managing with Tylenol  Plan: Complete SARS-CoV-2 testing scheduled for 12/25/2018 Consider evaluation in our office or chest x-ray after confirmed negative test Continue Tylenol as needed for fever management  Scleroderma ([place]) Plan: Continue prednisone 5 mg daily Continue follow-up with rheumatology  Allergy to multiple antibiotics Plan: We are very limited based off of patient's allergies to antibiotics We will treat with a second course of Augmentin for a longer course of 10 days Patient needs to be on probiotic    Follow Up Instructions:  Return in about 2 weeks (around 01/07/2019), or if symptoms worsen or fail to improve, for Follow up with [name] FNP-C.   I discussed the assessment and treatment plan with the patient. The patient was provided an opportunity to ask questions and all were answered. The patient agreed with the plan and demonstrated an understanding of the instructions.   The patient was advised to call back or seek an in-person evaluation if the symptoms worsen or if the condition fails to improve as anticipated.  I provided 25 minutes of non-face-to-face time during this encounter.   Lauraine Rinne, NP

## 2018-12-24 NOTE — Patient Instructions (Addendum)
Augmentin >>> Take 1 875-125 mg tablet every 12 hours for the next 10 days >>> Take with food  Prednisone 10mg  tablet  >>>4 tabs for 2 days, then 3 tabs for 2 days, 2 tabs for 2 days, then 1 tab for 2 days, then resume 5mg  prednisone daily dose  >>>take with food  >>>take in the morning   Complete outpatient testing for [name] - 12/25/2018   Continue  follow up with Rheumatology   Return in about 2 weeks (around 01/07/2019), or if symptoms worsen or fail to improve, for Follow up with [name] FNP-C, Follow up with Eric Form ACAGNP-BC, Follow up with Dr. Halford Chessman.

## 2018-12-24 NOTE — Assessment & Plan Note (Signed)
Plan: Continue prednisone 5 mg daily Continue follow-up with rheumatology

## 2018-12-24 NOTE — Telephone Encounter (Signed)
Noted. Agree.   [name]

## 2018-12-24 NOTE — Telephone Encounter (Signed)
Patient called and advised of the request for COVID testing, she verbalized understanding. Appointment scheduled for tomorrow, 12/25/18 at 1500 at [facility], advised of location and to wear a mask for everyone in the vehicle, she verbalized understanding. Order placed.     Dolores Lory, RN     12/24/18 1:19 PM Note   Primary Pulmonologist: Dr. Halford Chessman Last office visit and with whom: 09/11/18 with Eric Form What do we see them for (pulmonary problems): Scleroderma, BOOP  Reason for call: Fever, SOB, Cough, Sore throat, painful neck, sinus drainage, thrush  In the last month, have you been in contact with someone who was confirmed or suspected to have Conoravirus / COVID-19?  unknown  Do you have any of the following symptoms developed in the last 30 days? Fever: yes, 100.4 Cough:  Same as usual Shortness of breath: same as usual  When did your symptoms start?  12/24/2018  If the patient has a fever, what is the last reading?  (use n/a if patient denies fever)  100.4  IF THE PATIENT STATES THEY DO NOT OWN A THERMOMETER, THEY MUST GO AND PURCHASE ONE When did the fever start?: Today Have you taken any medication to suppress a fever (ie Ibuprofen, Aleve, Tylenol)?: no   Called and spoke to patient. Patient stated she traveled to [place] and just got back on 12/22/2018 due to brother dying/funeral.  Patient stated she recently had a sinus infection and currently continues to have sinus drainage, increased shortness of breath with movement, cough, sore throat, neck muscles are painful, fever of 100.4 today, and thrush.  Patient stated other symptoms started a couple of days ago but fever today.   Spoke with [name], NP who is going to do a televisit with patient this afternoon.  He would like her to get COVID testing.  Patient would like testing around 3pm on 7/1 if possible because that is when she will have a ride. Routing to [facility] to schedule.   Routing to [name] NP  as Juluis Rainier.

## 2018-12-24 NOTE — Telephone Encounter (Signed)
Scheduled for tomorrow at 3pm

## 2018-12-24 NOTE — Telephone Encounter (Signed)
Primary Pulmonologist: Dr. Halford Chessman Last office visit and with whom: 09/11/18 with Eric Form What do we see them for (pulmonary problems): Scleroderma, BOOP  Reason for call: Fever, SOB, Cough, Sore throat, painful neck, sinus drainage, thrush  In the last month, have you been in contact with someone who was confirmed or suspected to have Conoravirus / COVID-19?  unknown  Do you have any of the following symptoms developed in the last 30 days? Fever: yes, 100.4 Cough:  Same as usual Shortness of breath: same as usual  When did your symptoms start?  12/24/2018  If the patient has a fever, what is the last reading?  (use n/a if patient denies fever)  100.4 . IF THE PATIENT STATES THEY DO NOT OWN A THERMOMETER, THEY MUST GO AND PURCHASE ONE When did the fever start?: Today Have you taken any medication to suppress a fever (ie Ibuprofen, Aleve, Tylenol)?: no   Called and spoke to patient. Patient stated she traveled to [place] and just got back on 12/22/2018 due to brother dying/funeral.  Patient stated she recently had a sinus infection and currently continues to have sinus drainage, increased shortness of breath with movement, cough, sore throat, neck muscles are painful, fever of 100.4 today, and thrush.  Patient stated other symptoms started a couple of days ago but fever today.   Spoke with [name], NP who is going to do a televisit with patient this afternoon.  He would like her to get COVID testing.  Patient would like testing around 3pm on 7/1 if possible because that is when she will have a ride. Routing to [facility] to schedule.   Routing to [name] NP as Juluis Rainier.

## 2018-12-24 NOTE — Telephone Encounter (Signed)
Thank you lisa!  Aaron Edelman

## 2018-12-25 ENCOUNTER — Other Ambulatory Visit: Payer: 59

## 2018-12-25 ENCOUNTER — Ambulatory Visit: Payer: Medicare Other | Admitting: Acute Care

## 2018-12-25 ENCOUNTER — Telehealth: Payer: Self-pay | Admitting: Pulmonary Disease

## 2018-12-25 DIAGNOSIS — Z20822 Contact with and (suspected) exposure to covid-19: Secondary | ICD-10-CM

## 2018-12-25 DIAGNOSIS — R6889 Other general symptoms and signs: Secondary | ICD-10-CM | POA: Diagnosis not present

## 2018-12-25 NOTE — Telephone Encounter (Signed)
Assessment & Plan Note by Lauraine Rinne, NP at 12/24/2018 10:28 PM Author: Lauraine Rinne, NP Author Type: Nurse Practitioner Filed: 12/24/2018 10:28 PM  Note Status: Written Cosign: Cosign Not Required Encounter Date: 12/24/2018  Problem: Fever and chills  Editor: Lauraine Rinne, NP (Nurse Practitioner)    Assessment: Fever today, T-max 100.4 Currently managing with Tylenol  Plan: Complete SARS-CoV-2 testing scheduled for 12/25/2018 Consider evaluation in our office or chest x-ray after confirmed negative test Continue Tylenol as needed for fever management     Called and spoke with pt's daughter [name] letting her know that Aaron Edelman said after we receive the results of the COVID test and if results were negative, we could consider pt to get a cxr done. Terri verbalized understanding. Nothing further needed.

## 2018-12-27 ENCOUNTER — Emergency Department (HOSPITAL_COMMUNITY): Payer: Medicare Other

## 2018-12-27 ENCOUNTER — Other Ambulatory Visit: Payer: Self-pay

## 2018-12-27 ENCOUNTER — Encounter (HOSPITAL_COMMUNITY): Payer: Self-pay | Admitting: Emergency Medicine

## 2018-12-27 ENCOUNTER — Inpatient Hospital Stay (HOSPITAL_COMMUNITY)
Admission: EM | Admit: 2018-12-27 | Discharge: 2018-12-31 | DRG: 247 | Disposition: A | Discharge status: Home / Self Care | Payer: Medicare Other | Attending: Internal Medicine | Admitting: Internal Medicine

## 2018-12-27 DIAGNOSIS — Z8249 Family history of ischemic heart disease and other diseases of the circulatory system: Secondary | ICD-10-CM

## 2018-12-27 DIAGNOSIS — D72829 Elevated white blood cell count, unspecified: Secondary | ICD-10-CM | POA: Diagnosis not present

## 2018-12-27 DIAGNOSIS — K5909 Other constipation: Secondary | ICD-10-CM | POA: Diagnosis not present

## 2018-12-27 DIAGNOSIS — R079 Chest pain, unspecified: Secondary | ICD-10-CM | POA: Diagnosis not present

## 2018-12-27 DIAGNOSIS — Z7982 Long term (current) use of aspirin: Secondary | ICD-10-CM

## 2018-12-27 DIAGNOSIS — K581 Irritable bowel syndrome with constipation: Secondary | ICD-10-CM | POA: Diagnosis present

## 2018-12-27 DIAGNOSIS — M3500 Sicca syndrome, unspecified: Secondary | ICD-10-CM | POA: Diagnosis present

## 2018-12-27 DIAGNOSIS — Z7952 Long term (current) use of systemic steroids: Secondary | ICD-10-CM

## 2018-12-27 DIAGNOSIS — R05 Cough: Secondary | ICD-10-CM | POA: Diagnosis not present

## 2018-12-27 DIAGNOSIS — R110 Nausea: Secondary | ICD-10-CM | POA: Diagnosis not present

## 2018-12-27 DIAGNOSIS — I10 Essential (primary) hypertension: Secondary | ICD-10-CM

## 2018-12-27 DIAGNOSIS — J439 Emphysema, unspecified: Secondary | ICD-10-CM | POA: Diagnosis present

## 2018-12-27 DIAGNOSIS — Z87891 Personal history of nicotine dependence: Secondary | ICD-10-CM

## 2018-12-27 DIAGNOSIS — B349 Viral infection, unspecified: Secondary | ICD-10-CM | POA: Diagnosis not present

## 2018-12-27 DIAGNOSIS — J329 Chronic sinusitis, unspecified: Secondary | ICD-10-CM

## 2018-12-27 DIAGNOSIS — E119 Type 2 diabetes mellitus without complications: Secondary | ICD-10-CM

## 2018-12-27 DIAGNOSIS — E785 Hyperlipidemia, unspecified: Secondary | ICD-10-CM | POA: Diagnosis present

## 2018-12-27 DIAGNOSIS — Z981 Arthrodesis status: Secondary | ICD-10-CM

## 2018-12-27 DIAGNOSIS — Z794 Long term (current) use of insulin: Secondary | ICD-10-CM

## 2018-12-27 DIAGNOSIS — I959 Hypotension, unspecified: Secondary | ICD-10-CM | POA: Diagnosis not present

## 2018-12-27 DIAGNOSIS — I200 Unstable angina: Secondary | ICD-10-CM

## 2018-12-27 DIAGNOSIS — I25110 Atherosclerotic heart disease of native coronary artery with unstable angina pectoris: Principal | ICD-10-CM | POA: Diagnosis present

## 2018-12-27 DIAGNOSIS — I2510 Atherosclerotic heart disease of native coronary artery without angina pectoris: Secondary | ICD-10-CM

## 2018-12-27 DIAGNOSIS — I351 Nonrheumatic aortic (valve) insufficiency: Secondary | ICD-10-CM | POA: Diagnosis present

## 2018-12-27 DIAGNOSIS — I252 Old myocardial infarction: Secondary | ICD-10-CM

## 2018-12-27 DIAGNOSIS — Z79890 Hormone replacement therapy (postmenopausal): Secondary | ICD-10-CM

## 2018-12-27 DIAGNOSIS — Z9049 Acquired absence of other specified parts of digestive tract: Secondary | ICD-10-CM

## 2018-12-27 DIAGNOSIS — Z8673 Personal history of transient ischemic attack (TIA), and cerebral infarction without residual deficits: Secondary | ICD-10-CM

## 2018-12-27 DIAGNOSIS — Z209 Contact with and (suspected) exposure to unspecified communicable disease: Secondary | ICD-10-CM | POA: Diagnosis not present

## 2018-12-27 DIAGNOSIS — Z833 Family history of diabetes mellitus: Secondary | ICD-10-CM

## 2018-12-27 DIAGNOSIS — I209 Angina pectoris, unspecified: Secondary | ICD-10-CM | POA: Diagnosis not present

## 2018-12-27 DIAGNOSIS — M349 Systemic sclerosis, unspecified: Secondary | ICD-10-CM | POA: Diagnosis present

## 2018-12-27 DIAGNOSIS — Z823 Family history of stroke: Secondary | ICD-10-CM

## 2018-12-27 DIAGNOSIS — Z955 Presence of coronary angioplasty implant and graft: Secondary | ICD-10-CM

## 2018-12-27 DIAGNOSIS — E039 Hypothyroidism, unspecified: Secondary | ICD-10-CM | POA: Diagnosis present

## 2018-12-27 DIAGNOSIS — Z20828 Contact with and (suspected) exposure to other viral communicable diseases: Secondary | ICD-10-CM | POA: Diagnosis present

## 2018-12-27 DIAGNOSIS — Z8541 Personal history of malignant neoplasm of cervix uteri: Secondary | ICD-10-CM

## 2018-12-27 DIAGNOSIS — J8489 Other specified interstitial pulmonary diseases: Secondary | ICD-10-CM | POA: Diagnosis present

## 2018-12-27 DIAGNOSIS — K219 Gastro-esophageal reflux disease without esophagitis: Secondary | ICD-10-CM | POA: Diagnosis present

## 2018-12-27 LAB — BASIC METABOLIC PANEL WITH GFR
Anion gap: 11 (ref 5–15)
BUN: 17 mg/dL (ref 8–23)
CO2: 27 mmol/L (ref 22–32)
Calcium: 8.4 mg/dL — ABNORMAL LOW (ref 8.9–10.3)
Chloride: 92 mmol/L — ABNORMAL LOW (ref 98–111)
Creatinine, Ser: 0.88 mg/dL (ref 0.44–1.00)
GFR calc Af Amer: >60 mL/min
GFR calc non Af Amer: 60 mL/min — ABNORMAL LOW
Glucose, Bld: 366 mg/dL — ABNORMAL HIGH (ref 70–99)
Potassium: 3.7 mmol/L (ref 3.5–5.1)
Sodium: 130 mmol/L — ABNORMAL LOW (ref 135–145)

## 2018-12-27 LAB — MAGNESIUM: Magnesium: 1.5 mg/dL — ABNORMAL LOW (ref 1.7–2.4)

## 2018-12-27 LAB — CBC
HCT: 38.8 % (ref 36.0–46.0)
Hemoglobin: 12.6 g/dL (ref 12.0–15.0)
MCH: 27.2 pg (ref 26.0–34.0)
MCHC: 32.5 g/dL (ref 30.0–36.0)
MCV: 83.8 fL (ref 80.0–100.0)
Platelets: 207 10*3/uL (ref 150–400)
RBC: 4.63 MIL/uL (ref 3.87–5.11)
RDW: 13.6 % (ref 11.5–15.5)
WBC: 9.4 10*3/uL (ref 4.0–10.5)
nRBC: 0 % (ref 0.0–0.2)

## 2018-12-27 LAB — TROPONIN I (HIGH SENSITIVITY)
Troponin I (High Sensitivity): 44 ng/L — ABNORMAL HIGH (ref <18)
Troponin I (High Sensitivity): 44 ng/L — ABNORMAL HIGH (ref <18)

## 2018-12-27 MED ORDER — MAGNESIUM SULFATE 2 GM/50ML IV SOLN
2.0000 g | Freq: Once | INTRAVENOUS | Status: AC
  Administered 2018-12-27: 2 g via INTRAVENOUS
  Filled 2018-12-27: qty 50

## 2018-12-27 NOTE — ED Triage Notes (Signed)
Patient called EMS due to chest discomfort that she was having two days ago and it got worse.  She said she has had two heart attacks before and her pain felt like it was the same from before.  EMS gave her 2/nitros and patient had relief.  Patient is pain free now.

## 2018-12-27 NOTE — H&P (Signed)
History and Physical    [name] JSE:[id number] DOB: 1930-07-20 DOA: 12/27/2018  PCP: Merrilee Seashore, MD Patient coming from: Home  Chief Complaint: Chest pain  HPI: [name] is a 85 y.o. female with medical history significant of Sjogren disease, scleroderma on chronic steroids (managed by Dr. Estanislado Pandy), chronic sinusitis (managed by Dr. Lucia Gaskins with ENT), emphysema and abnormal CT with probable [place] (followed by pulmonology), type 2 diabetes, hypertension, hyperlipidemia, hypothyroidism, CAD status post PCI presenting to the hospital for evaluation of chest pain.  Received nitroglycerin x2 by EMS and had relief.  Chest pain-free upon arrival to the ED.  Patient states she has had 3 stents placed in her heart and her cardiologist is Dr. Dorris Carnes.  States for the past 2 days she is experiencing substernal pressure-like chest pain which radiates to both sides of her jaw.  Chest pain is only exertional and resolves in a few minutes after she rests.  It is associated with dyspnea and nausea.  States last night when she had chest pain again it was relieved after EMS gave her nitroglycerin.  She thinks she had a fever at home.  Reports having chronic sinus infections for which she was recently started on prednisone and Augmentin.  Denies any chills or cough.  Denies any vomiting or abdominal pain.  Reports having chronic constipation and her last bowel movement was a week ago.  States she has been passing a lot of gas.  She does not like taking laxatives as they give her diarrhea.  ED Course: Hemodynamically stable.  Afebrile and no leukocytosis.  High-sensitivity troponin elevated 44 >44.  EKG not suggestive of ACS.  Blood glucose 366, bicarb 27, anion gap 11.  Chest x-ray personally reviewed showing no acute cardiopulmonary process.  COVID-19 rapid test pending.  Review of Systems:  All systems reviewed and apart from history of presenting illness, are negative.  Past Medical  History:  Diagnosis Date  . Adenomatous colon polyp   . Arthritis   . Blood transfusion   . Cancer [facility])    cervical cancer pre hysterectomy  . Cervical disc disease   . Chronic sinusitis   . CONSTIPATION, CHRONIC 06/26/2007   Qualifier: Diagnosis of  By: Nils Pyle CMA ([place]), Mearl Latin    . COPD (chronic obstructive pulmonary disease) ([place])   . Coronary artery disease    prior MI with stenting of RCA with repeat PTCA/stenting for ISR in 01/2008, stent to LM 11/2008  . CVA (cerebral infarction) 1991  . Depression   . Diabetes mellitus   . Diverticulosis   . 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 Dyslipidemia   . Emphysema   . Fibromyalgia   . GERD (gastroesophageal reflux disease)   . Hypertension   . Hypothyroidism   . IBS (irritable bowel syndrome)   . Legally blind   . Macular degeneration   . Myocardial infarction [facility]) may 27th 2007  . Pneumonia 07/16/2013    HX PNEUMONIA  . Pulmonary hypertension ([place])   . Raynaud's phenomenon   . Scleroderma ([place])   . Sjogren's disease ([place])   . Sleep apnea    STOP BANG SCORE 5  . Stroke [facility]) 1990   brain stem stroke - weakness rt hand  . Zenker's diverticulum     Past Surgical History:  Procedure Laterality Date  . APPENDECTOMY    . BALLOON DILATION N/A 07/15/2015   Procedure: BALLOON DILATION;  Surgeon: Milus Banister, MD;  Location: [facility];  Service: Endoscopy;  Laterality: N/A;  . BILATERAL OOPHORECTOMY  2002  . BRONCHOSCOPY  02-22-09  . CARDIAC CATHETERIZATION     x 3  . CATARACT EXTRACTION    . CHOLECYSTECTOMY  1965  . COLON SURGERY  2014   colon resection  . colonectomy    . ESOPHAGOGASTRODUODENOSCOPY (EGD) WITH ESOPHAGEAL DILATION N/A 06/18/2013   Procedure: ESOPHAGOGASTRODUODENOSCOPY (EGD) WITH ESOPHAGEAL DILATION;  Surgeon: Inda Castle, MD;  Location: [facility];  Service: Endoscopy;  Laterality: N/A;  . FLEXIBLE SIGMOIDOSCOPY N/A 07/15/2015   Procedure: FLEXIBLE SIGMOIDOSCOPY;  Surgeon: Milus Banister, MD;   Location: [facility];  Service: Endoscopy;  Laterality: N/A;  . lobe thyroid removal    . PROCTOSCOPY  03/18/2012   Procedure: PROCTOSCOPY;  Surgeon: Adin Hector, MD;  Location: [facility];  Service: General;  Laterality: N/A;  rigid procto  . RADIOLOGY WITH ANESTHESIA N/A 03/09/2015   Procedure: MRI CERVICAL SPINE WITHOUT AND LUMBER WITHOUT;  Surgeon: Medication Radiologist, MD;  Location: [facility];  Service: Radiology;  Laterality: N/A;  . [place]   and implantation of a plate   . STENTED CARDIAC  ARTERY  2007 / 2007 / 2010   X 3 STENT  . THYROID LOBECTOMY  1991   removal of left lobe thyroid  . TOTAL ABDOMINAL HYSTERECTOMY  1973     reports that she quit smoking about 31 years ago. Her smoking use included cigarettes. She started smoking about 71 years ago. She has a 80.00 pack-year smoking history. She has never used smokeless tobacco. She reports that she does not drink alcohol or use drugs.  Allergies  Allergen Reactions  . Flomax [Tamsulosin Hcl] Other (See Comments)    Made her 'trachea feel tight and couldn't swallow'  . Peanut Oil Anaphylaxis, Swelling, Rash and Other (See Comments)    Throat tightens, also  . Sulfa Antibiotics Anaphylaxis       . Titanium Other (See Comments)    Neck wouldn't heal with titanium cervical plate  . Yellow Dye Other (See Comments)    "Bee stings" all over body when eating/taking medications with red or yellow dye   . Lantus [Insulin Glargine] Other (See Comments)    Patient states "sulfate binder causes sinus infection/pneumonia".  [place] [Levofloxacin In D5w] Other (See Comments)    Muscle aches  . Metoprolol-Hydrochlorothiazide Other (See Comments)    Decreased B/P  . Norvasc [Amlodipine] Swelling and Other (See Comments)    BIL Ankle edema  . Novolog [Insulin Aspart]     Patient states "sulfate binder causes sinus infection/pneumonia". Tolerates Humalog.  [name] Penciclovir Other (See Comments)    Muscle Pain  . Senna  [Sennosides] Other (See Comments)    Pt states that it causes a "stinging" sensation  . Statins Other (See Comments)    All of them make me "feel badly"  . Sulfonamide Derivatives Hives  . Adhesive [Tape] Hives and Rash  . Maxidex [Dexamethasone] Anxiety and Other (See Comments)    Insomnia and dizziness, too    Family History  Problem Relation Age of Onset  . Other Mother 52       Died from sepsis  . Stroke Father 25       died  . Heart disease Father   . Diabetes Father   . Cancer Sister        lung  . Diabetes Sister   . Cancer Brother        lung  . Diabetes Brother   .  Diabetes Brother     Prior to Admission medications   Medication Sig Start Date End Date Taking? Authorizing Provider  amLODipine (NORVASC) 2.5 MG tablet Take 1 tablet (2.5 mg total) by mouth daily. 10/11/18   Fay Records, MD  amoxicillin-clavulanate (AUGMENTIN) 875-125 MG tablet Take 1 tablet by mouth 2 (two) times daily. 12/24/18   Lauraine Rinne, NP  aspirin EC 81 MG tablet Take 1 tablet (81 mg total) by mouth daily. 11/18/13   Fay Records, MD  cetirizine (ZYRTEC) 10 MG tablet Take 10 mg by mouth daily as needed for allergies.    [provider]  esomeprazole (NEXIUM) 40 MG capsule Take 40 mg by mouth daily.     [provider]  fluticasone (FLONASE) 50 MCG/ACT nasal spray Place 1 spray into both nostrils daily. 07/11/17   Ghimire, Henreitta Leber, MD  furosemide (LASIX) 20 MG tablet Take 1 tablet (20 mg total) by mouth daily. 09/03/18   Daune Perch, NP  HYDROcodone-acetaminophen (NORCO) 7.5-325 MG tablet Take 1 tablet by mouth every 6 (six) hours as needed for moderate pain.    [provider]  imipramine (TOFRANIL) 25 MG tablet Take 75 mg by mouth at bedtime.  05/26/09   [provider]  insulin lispro (HUMALOG KWIKPEN) 100 UNIT/ML KwikPen Inject 5 Units into the skin 3 (three) times daily. Inject 6 units into the skin with breakfast, 6 units with lunch, and 5 units with  supper    [provider]  levothyroxine (SYNTHROID, LEVOTHROID) 100 MCG tablet Take 100 mcg by mouth daily before breakfast.    [provider]  montelukast (SINGULAIR) 10 MG tablet Take 10 mg by mouth at bedtime.  05/10/15   [provider]  Naphazoline-Pheniramine (OPCON-A) 0.027-0.315 % SOLN Place 1-2 drops into both eyes as needed (for allergies).    [provider]  predniSONE (DELTASONE) 10 MG tablet 4 tabs for 2 days, then 3 tabs for 2 days, 2 tabs for 2 days, then 1 tab for 2 days, then stop 12/24/18   Lauraine Rinne, NP  predniSONE (DELTASONE) 5 MG tablet TAKE 1 TABLET ONCE A DAY WITH BREAKFAST 07/11/18   Chesley Mires, MD  triazolam (HALCION) 0.25 MG tablet Take 0.25 mg by mouth at bedtime as needed for sleep.     [provider]    Physical Exam: Vitals:   12/27/18 2030 12/27/18 2115 12/27/18 2200 12/28/18 0015  BP: (!) 136/58 121/70 (!) 141/62 (!) 135/111  Pulse: 73 73 77 78  Resp: 11  12   Temp:      TempSrc:      SpO2: 98% 97% 97% 100%    Physical Exam  Constitutional: She is oriented to person, place, and time. She appears well-developed and well-nourished. No distress.  Resting comfortably, nontoxic-appearing  HENT:  Head: Normocephalic.  Mouth/Throat: Oropharynx is clear and moist.  Eyes: Right eye exhibits no discharge. Left eye exhibits no discharge.  Neck: Neck supple.  Cardiovascular: Normal rate, regular rhythm and intact distal pulses.  Pulmonary/Chest: Effort normal and breath sounds normal. No respiratory distress. She has no wheezes. She has no rales.  Abdominal: Soft. Bowel sounds are normal. She exhibits no distension. There is no abdominal tenderness. There is no rebound and no guarding.  Musculoskeletal:        General: No edema.  Neurological: She is alert and oriented to person, place, and time.  Skin: Skin is warm and dry. She is not diaphoretic.  Labs on Admission: I have personally reviewed following  labs and imaging studies  CBC: Recent Labs  Lab 12/27/18 1652  WBC 9.4  HGB 12.6  HCT 38.8  MCV 83.8  PLT 599   Basic Metabolic Panel: Recent Labs  Lab 12/27/18 1652  NA 130*  K 3.7  CL 92*  CO2 27  GLUCOSE 366*  BUN 17  CREATININE 0.88  CALCIUM 8.4*  MG 1.5*   GFR: CrCl cannot be calculated (Unknown ideal weight.). Liver Function Tests: No results for input(s): AST, ALT, ALKPHOS, BILITOT, PROT, ALBUMIN in the last 168 hours. No results for input(s): LIPASE, AMYLASE in the last 168 hours. No results for input(s): AMMONIA in the last 168 hours. Coagulation Profile: No results for input(s): INR, PROTIME in the last 168 hours. Cardiac Enzymes: No results for input(s): CKTOTAL, CKMB, CKMBINDEX, TROPONINI in the last 168 hours. BNP (last 3 results) No results for input(s): PROBNP in the last 8760 hours. HbA1C: No results for input(s): HGBA1C in the last 72 hours. CBG: No results for input(s): GLUCAP in the last 168 hours. Lipid Profile: No results for input(s): CHOL, HDL, LDLCALC, TRIG, CHOLHDL, LDLDIRECT in the last 72 hours. Thyroid Function Tests: No results for input(s): TSH, T4TOTAL, FREET4, T3FREE, THYROIDAB in the last 72 hours. Anemia Panel: No results for input(s): VITAMINB12, FOLATE, FERRITIN, TIBC, IRON, RETICCTPCT in the last 72 hours. Urine analysis:    Component Value Date/Time   COLORURINE YELLOW 07/30/2018 1441   APPEARANCEUR CLEAR 07/30/2018 1441   LABSPEC 1.010 07/30/2018 1441   PHURINE 5.0 07/30/2018 1441   GLUCOSEU NEGATIVE 07/30/2018 1441   GLUCOSEU NEGATIVE 03/24/2010 1137   HGBUR NEGATIVE 07/30/2018 1441   BILIRUBINUR NEGATIVE 07/30/2018 1441   KETONESUR NEGATIVE 07/30/2018 1441   PROTEINUR NEGATIVE 07/30/2018 1441   UROBILINOGEN 0.2 05/01/2015 2019   NITRITE NEGATIVE 07/30/2018 1441   LEUKOCYTESUR NEGATIVE 07/30/2018 1441    Radiological Exams on Admission: Dg Chest Portable 1 View  Result Date: 12/27/2018 CLINICAL DATA:  Patient  with fever, cough and sore throat. EXAM: PORTABLE CHEST 1 VIEW COMPARISON:  Chest radiograph 08/28/2018 FINDINGS: Monitoring leads overlie the patient. Stable cardiac and mediastinal contours. Aortic atherosclerosis. No large area pulmonary consolidation. No pleural effusion or pneumothorax. Thoracic spine degenerative changes. IMPRESSION: No acute cardiopulmonary process. Electronically Signed   By: Lovey Newcomer M.D.   On: 12/27/2018 16:53    EKG: Independently reviewed.  Sinus rhythm, T wave inversions in leads I, II, and lateral leads similar to prior tracing from February 2020.  Assessment/Plan Principal Problem:   Angina pectoris (HCC) Active Problems:   HTN (hypertension)   Chronic constipation   Type 2 diabetes mellitus (HCC)   CAD (coronary artery disease)   Chronic sinusitis   Angina, history of CAD status post multiple stents History concerning for anginal symptoms as chest pain is exertional and resolves with rest.  Currently chest pain-free after receiving nitroglycerin by EMS.  Hemodynamically stable.  High-sensitivity troponin elevated but remaining flat 44 >44.  EKG not suggestive of ACS.  Chest x-ray showing no active disease.  Stress test done in March 2019 was low risk. -Discussed with cardiology, not recommending starting heparin at this time.  Will consult in a.m. -Cardiac monitoring -Aspirin 324 mg -Sublingual nitroglycerin as needed -Trend troponin every 6 hours -EKG PRN recurrence of chest pain -Echocardiogram -Keep n.p.o. after midnight  Uncontrolled type 2 diabetes Last A1c 8.0 in February 2020.  Blood glucose 366 on arrival.  Labs not suggestive of DKA. -  Repeat A1c.  Sliding scale insulin moderate with bedtime coverage and CBG checks.  Chronic sinusitis Afebrile and no leukocytosis.  Followed by ENT.  Recently started on a prednisone taper and Augmentin by her pulmonologist's office. -Continue steroid taper and antibiotic  Chronic constipation States her  last bowel movement was a week ago.  She is passing a lot of gas.  No complains of abdominal pain and exam benign. -MiraLAX  Hypertension -Continue home amlodipine  Hypothyroidism -Continue home Synthroid  DVT prophylaxis: Subcutaneous heparin Code Status: Patient wishes to be full code. Family Communication: No family available at this time. Disposition Plan: Anticipate discharge after clinical improvement. Consults called: None Admission status: It is my clinical opinion that referral for OBSERVATION is reasonable and necessary in this patient based on the above information provided. The aforementioned taken together are felt to place the patient at high risk for further clinical deterioration. However it is anticipated that the patient may be medically stable for discharge from the hospital within 24 to 48 hours.  The medical decision making on this patient was of high complexity and the patient is at high risk for clinical deterioration, therefore this is a level 3 visit.  Shela Leff MD [facility] Pager [phone]  If 7PM-7AM, please contact night-coverage [email or web address] Password [email or web address]  12/28/2018, 12:53 AM

## 2018-12-27 NOTE — ED Notes (Signed)
BG [phone]

## 2018-12-27 NOTE — ED Notes (Signed)
Lab getting labs.

## 2018-12-27 NOTE — ED Provider Notes (Signed)
[facility] Provider Note   CSN: [id number] Arrival date & time: 12/27/18  1602    History   Chief Complaint Chief Complaint  Patient presents with  . Chest Pain    HPI [name] is a 85 y.o. female.     HPI  Is a 85 year old female with past medical history of cervical cancer, COPD, CAD s/p several stents, prior CVA, scleroderma and further history as below who presents to the emergency department today for evaluation of chest discomfort.  She reports that she had sudden onset burning pressure-like chest discomfort in the center of her chest.  She says that it is now resolved after receiving 2 doses of nitroglycerin while being transported here by EMS.  The patient lives alone and just returned 3 days ago from traveling out of state to [place].  Preceding the onset of chest discomfort today she had a headache, sore throat, dry mouth and chills for the past 2 days.  She reports chronic shortness of breath that she says is not any worse than normal at this time.  Last stress test was March 2019 and was normal.  Of note additional pertinent information on review of records available within the EMR;  she had a telehealth visit with her Pulmonologist on 6/30. Notes from that tele-visit indicate that the patient contacted their office back in March of this year with acute pulmonary symptoms and was recommended for COVID-19 testing, though the patient did not come in after instructions were given. She then was noted to have traveled out of state, returning on 6/29.  She was recently treated for a recurrent sinus infection with a 7-day course of Augmentin which finished on 6/28.  Daughter Coralyn Mark) says that patient (and her) traveled to [place] to visit with the patient's older brother who was apparently near-death. She got back from [place] on Saturday (5 days ago) and began c/o chills, fever on Monday to 100.4. They traveled by air to [place] and says there  were numerous people within the (several) airports they were in that were not wearing masks or social distancing, despite the widespread and increasing rates of infection from the global novel coronavirus pandemic.   Past Medical History:  Diagnosis Date  . Adenomatous colon polyp   . Arthritis   . Blood transfusion   . Cancer [facility])    cervical cancer pre hysterectomy  . Cervical disc disease   . Chronic sinusitis   . CONSTIPATION, CHRONIC 06/26/2007   Qualifier: Diagnosis of  By: Nils Pyle CMA ([place]), Mearl Latin    . COPD (chronic obstructive pulmonary disease) ([place])   . Coronary artery disease    prior MI with stenting of RCA with repeat PTCA/stenting for ISR in 01/2008, stent to LM 11/2008  . CVA (cerebral infarction) 1991  . Depression   . Diabetes mellitus   . Diverticulosis   . 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 Dyslipidemia   . Emphysema   . Fibromyalgia   . GERD (gastroesophageal reflux disease)   . Hypertension   . Hypothyroidism   . IBS (irritable bowel syndrome)   . Legally blind   . Macular degeneration   . Myocardial infarction [facility]) may 27th 2007  . Pneumonia 07/16/2013    HX PNEUMONIA  . Pulmonary hypertension ([place])   . Raynaud's phenomenon   . Scleroderma ([place])   . Sjogren's disease ([place])   . Sleep apnea    STOP BANG SCORE 5  . Stroke [facility]) 1990  brain stem stroke - weakness rt hand  . Zenker's diverticulum     Patient Active Problem List   Diagnosis Date Noted  . Angina pectoris ([place]) 12/28/2018  . CAD (coronary artery disease) 12/28/2018  . Chronic sinusitis 12/28/2018  . Fever and chills 12/24/2018  . Allergy to multiple antibiotics 12/24/2018  . GERD (gastroesophageal reflux disease) 09/11/2018  . Dyspnea 09/11/2018  . Grade II diastolic dysfunction 23/30/0762  . Thrombocytopenia ([place]) 07/30/2018  . Amenia 07/17/2018  . Recurrent sinus infections 07/05/2018  . Jaw pain 06/17/2018  . Multifocal pneumonia 06/17/2018  . Abnormal ECG   . SOB (shortness  of breath), secondary to [place] 07/16/2017  . Chest pain, moderate coronary artery risk 07/13/2017  . Hypoxia 07/09/2017  . Dehydration   . Enteritis 11/17/2016  . Nondisplaced fracture of second metatarsal bone, left foot, subsequent encounter for fracture with routine healing 06/29/2016  . Arthralgia of both lower legs 03/30/2016  . Hypothyroidism 10/01/2015  . Sepsis ([place]) 05/16/2015  . HCAP (healthcare-associated pneumonia) 05/16/2015  . Diarrhea 05/02/2015  . Diarrhea with dehydration 05/02/2015  . Hyponatremia 05/02/2015  . Weakness 01/29/2015  . Pain in joint, ankle and foot 11/20/2014  . Acute bronchitis 09/01/2014  . CAP (community acquired pneumonia) 05/26/2014  . Dysphagia, unspecified(787.20) 06/18/2013  . Stricture and stenosis of esophagus 06/18/2013  . Urinary incontinence in female 05/06/2012  . Fibromyalgia   . Chronic confusional state 03/21/2012  . Postoperative anemia 03/21/2012  . Anemia of chronic disease 03/19/2012  . Type 2 diabetes mellitus ([place]) 05/30/2011  . COPD (chronic obstructive pulmonary disease) ([place]) 05/30/2011  . Scleroderma ([place]) 05/30/2011  . Colonic stricture s/p lap sigmoid colectomy 03/18/2012 03/27/2011  . Diverticulosis of colon 03/27/2011  . Systemic sclerosis ([place]) 06/29/2009  . Pure hypercholesterolemia 05/25/2009  . BOOP (bronchiolitis obliterans with organizing pneumonia) ([place]) 02/18/2009  . Chronic rhinitis 07/08/2007  . EMPHYSEMA 07/08/2007  . Hyperlipidemia 06/26/2007  . Depression 06/26/2007  . Legal blindness 06/26/2007  . HTN (hypertension) 06/26/2007  . Coronary atherosclerosis 06/26/2007  . ZENKER'S DIVERTICULUM 06/26/2007  . Chronic constipation 06/26/2007  . Irritable bowel syndrome 06/26/2007    Past Surgical History:  Procedure Laterality Date  . APPENDECTOMY    . BALLOON DILATION N/A 07/15/2015   Procedure: BALLOON DILATION;  Surgeon: Milus Banister, MD;  Location: [facility];  Service: Endoscopy;  Laterality:  N/A;  . BILATERAL OOPHORECTOMY  2002  . BRONCHOSCOPY  02-22-09  . CARDIAC CATHETERIZATION     x 3  . CATARACT EXTRACTION    . CHOLECYSTECTOMY  1965  . COLON SURGERY  2014   colon resection  . colonectomy    . ESOPHAGOGASTRODUODENOSCOPY (EGD) WITH ESOPHAGEAL DILATION N/A 06/18/2013   Procedure: ESOPHAGOGASTRODUODENOSCOPY (EGD) WITH ESOPHAGEAL DILATION;  Surgeon: Inda Castle, MD;  Location: [facility];  Service: Endoscopy;  Laterality: N/A;  . FLEXIBLE SIGMOIDOSCOPY N/A 07/15/2015   Procedure: FLEXIBLE SIGMOIDOSCOPY;  Surgeon: Milus Banister, MD;  Location: [facility];  Service: Endoscopy;  Laterality: N/A;  . lobe thyroid removal    . PROCTOSCOPY  03/18/2012   Procedure: PROCTOSCOPY;  Surgeon: Adin Hector, MD;  Location: [facility];  Service: General;  Laterality: N/A;  rigid procto  . RADIOLOGY WITH ANESTHESIA N/A 03/09/2015   Procedure: MRI CERVICAL SPINE WITHOUT AND LUMBER WITHOUT;  Surgeon: Medication Radiologist, MD;  Location: [facility];  Service: Radiology;  Laterality: N/A;  . [place]   and implantation of a plate   . STENTED CARDIAC  ARTERY  2007 / 2007 / 2010   X 3 STENT  . THYROID LOBECTOMY  1991   removal of left lobe thyroid  . TOTAL ABDOMINAL HYSTERECTOMY  1973     OB History   No obstetric history on file.      Home Medications    Prior to Admission medications   Medication Sig Start Date End Date Taking? Authorizing Provider  amLODipine (NORVASC) 2.5 MG tablet Take 1 tablet (2.5 mg total) by mouth daily. 10/11/18  Yes Fay Records, MD  amoxicillin-clavulanate (AUGMENTIN) 875-125 MG tablet Take 1 tablet by mouth 2 (two) times daily. 12/24/18  Yes Lauraine Rinne, NP  aspirin EC 81 MG tablet Take 1 tablet (81 mg total) by mouth daily. 11/18/13  Yes Fay Records, MD  cetirizine (ZYRTEC) 10 MG tablet Take 10 mg by mouth daily as needed for allergies.   Yes [provider]  esomeprazole (NEXIUM) 40 MG capsule Take 40 mg by mouth daily.    Yes  [provider]  fluticasone (FLONASE) 50 MCG/ACT nasal spray Place 1 spray into both nostrils daily. 07/11/17  Yes Ghimire, Henreitta Leber, MD  furosemide (LASIX) 20 MG tablet Take 1 tablet (20 mg total) by mouth daily. 09/03/18  Yes Daune Perch, NP  HYDROcodone-acetaminophen (NORCO) 7.5-325 MG tablet Take 0.5-1 tablets by mouth every 6 (six) hours as needed for moderate pain.    Yes [provider]  imipramine (TOFRANIL) 25 MG tablet Take 75 mg by mouth at bedtime.  05/26/09  Yes [provider]  insulin lispro (HUMALOG KWIKPEN) 100 UNIT/ML KwikPen Inject 5 Units into the skin 3 (three) times daily.    Yes [provider]  levothyroxine (SYNTHROID, LEVOTHROID) 100 MCG tablet Take 100 mcg by mouth daily before breakfast.   Yes [provider]  montelukast (SINGULAIR) 10 MG tablet Take 10 mg by mouth at bedtime.  05/10/15  Yes [provider]  Naphazoline-Pheniramine (OPCON-A) 0.027-0.315 % SOLN Place 1-2 drops into both eyes as needed (for allergies).   Yes [provider]  predniSONE (DELTASONE) 10 MG tablet 4 tabs for 2 days, then 3 tabs for 2 days, 2 tabs for 2 days, then 1 tab for 2 days, then stop 12/24/18  Yes Lauraine Rinne, NP  triazolam (HALCION) 0.25 MG tablet Take 0.25 mg by mouth at bedtime.    Yes [provider]  predniSONE (DELTASONE) 5 MG tablet TAKE 1 TABLET ONCE A DAY WITH BREAKFAST Patient not taking: Reported on 12/28/2018 07/11/18   Chesley Mires, MD    Family History Family History  Problem Relation Age of Onset  . Other Mother 63       Died from sepsis  . Stroke Father 17       died  . Heart disease Father   . Diabetes Father   . Cancer Sister        lung  . Diabetes Sister   . Cancer Brother        lung  . Diabetes Brother   . Diabetes Brother     Social History Social History   Tobacco Use  . Smoking status: Former Smoker    Packs/day: 2.00    Years: 40.00    Pack years: 80.00    Types:  Cigarettes    Start date: 1949    Quit date: 1989    Years since quitting: 31.5  . Smokeless tobacco: Never Used  Substance Use Topics  . Alcohol use: No  .  Drug use: No     Allergies   Flomax [tamsulosin hcl], Peanut oil, Sulfa antibiotics, Titanium, Yellow dye, Lantus [insulin glargine], Levaquin [levofloxacin in d5w], Metoprolol-hydrochlorothiazide, Norvasc [amlodipine], Novolog [insulin aspart], Penciclovir, Senna [sennosides], Statins, Sulfonamide derivatives, Adhesive [tape], and Maxidex [dexamethasone]   Review of Systems Review of Systems  Constitutional: Negative for chills and fever.  HENT: Negative for ear pain and sore throat.   Eyes: Negative for pain and visual disturbance.  Respiratory: Positive for shortness of breath (not at this moment, but over the last several days she has). Negative for cough.   Cardiovascular: Positive for chest pain (resolved by arrival to ED ). Negative for palpitations.  Gastrointestinal: Negative for abdominal pain and vomiting.  Genitourinary: Negative for dysuria and hematuria.  Musculoskeletal: Negative for arthralgias and back pain.  Skin: Negative for color change and rash.  Neurological: Negative for seizures and syncope.  All other systems reviewed and are negative.    Physical Exam Updated Vital Signs BP (!) 152/68 (BP Location: Left Arm)   Pulse 78   Temp 98.2 F (36.8 C) (Oral)   Resp 18   Ht 5\' 2"  (1.575 m)   Wt 61.5 kg   SpO2 97%   BMI 24.78 kg/m   Physical Exam Vitals signs and nursing note reviewed.  Constitutional:      General: She is not in acute distress.    Appearance: She is well-developed.  HENT:     Head: Normocephalic and atraumatic.  Eyes:     Conjunctiva/sclera: Conjunctivae normal.  Neck:     Musculoskeletal: Neck supple.  Cardiovascular:     Rate and Rhythm: Normal rate and regular rhythm.     Heart sounds: No murmur.  Pulmonary:     Effort: Pulmonary effort is normal. No respiratory  distress.     Breath sounds: Normal breath sounds.  Abdominal:     Palpations: Abdomen is soft.     Tenderness: There is no abdominal tenderness.  Skin:    General: Skin is warm and dry.  Neurological:     Mental Status: She is alert.      ED Treatments / Results  Labs (all labs ordered are listed, but only abnormal results are displayed) Labs Reviewed  BASIC METABOLIC PANEL - Abnormal; Notable for the following components:      Result Value   Sodium 130 (*)    Chloride 92 (*)    Glucose, Bld 366 (*)    Calcium 8.4 (*)    GFR calc non Af Amer 60 (*)    All other components within normal limits  TROPONIN I (HIGH SENSITIVITY) - Abnormal; Notable for the following components:   Troponin I (High Sensitivity) 44 (*)    All other components within normal limits  MAGNESIUM - Abnormal; Notable for the following components:   Magnesium 1.5 (*)    All other components within normal limits  URINALYSIS, ROUTINE W REFLEX MICROSCOPIC - Abnormal; Notable for the following components:   Color, Urine STRAW (*)    Glucose, UA >=500 (*)    Hgb urine dipstick SMALL (*)    Protein, ur 100 (*)    Leukocytes,Ua MODERATE (*)    All other components within normal limits  TROPONIN I (HIGH SENSITIVITY) - Abnormal; Notable for the following components:   Troponin I (High Sensitivity) 44 (*)    All other components within normal limits  TROPONIN I (HIGH SENSITIVITY) - Abnormal; Notable for the following components:   Troponin I (High Sensitivity)  42 (*)    All other components within normal limits  TROPONIN I (HIGH SENSITIVITY) - Abnormal; Notable for the following components:   Troponin I (High Sensitivity) 35 (*)    All other components within normal limits  TROPONIN I (HIGH SENSITIVITY) - Abnormal; Notable for the following components:   Troponin I (High Sensitivity) 43 (*)    All other components within normal limits  HEMOGLOBIN A1C - Abnormal; Notable for the following components:   Hgb  A1c MFr Bld 9.0 (*)    All other components within normal limits  GLUCOSE, CAPILLARY - Abnormal; Notable for the following components:   Glucose-Capillary 182 (*)    All other components within normal limits  GLUCOSE, CAPILLARY - Abnormal; Notable for the following components:   Glucose-Capillary 144 (*)    All other components within normal limits  GLUCOSE, CAPILLARY - Abnormal; Notable for the following components:   Glucose-Capillary 193 (*)    All other components within normal limits  GLUCOSE, CAPILLARY - Abnormal; Notable for the following components:   Glucose-Capillary 319 (*)    All other components within normal limits  GLUCOSE, CAPILLARY - Abnormal; Notable for the following components:   Glucose-Capillary 248 (*)    All other components within normal limits  SARS CORONAVIRUS 2 (HOSPITAL ORDER, [facility])  CBC  CBG MONITORING, ED    EKG EKG Interpretation  Date/Time:  Friday December 27 2018 16:14:42 EDT Ventricular Rate:  79 PR Interval:    QRS Duration: 81 QT Interval:  379 QTC Calculation: 435 R Axis:   65 Text Interpretation:  Sinus rhythm T wave depressions similar to prior.  No STEMI  Confirmed by Nanda Quinton [phone]) on 12/27/2018 4:23:10 PM   Radiology Dg Chest Portable 1 View  Result Date: 12/27/2018 CLINICAL DATA:  Patient with fever, cough and sore throat. EXAM: PORTABLE CHEST 1 VIEW COMPARISON:  Chest radiograph 08/28/2018 FINDINGS: Monitoring leads overlie the patient. Stable cardiac and mediastinal contours. Aortic atherosclerosis. No large area pulmonary consolidation. No pleural effusion or pneumothorax. Thoracic spine degenerative changes. IMPRESSION: No acute cardiopulmonary process. Electronically Signed   By: Lovey Newcomer M.D.   On: 12/27/2018 16:53    Procedures Procedures (including critical care time)  Medications Ordered in ED Medications  aspirin EC tablet 81 mg (81 mg Oral Given 12/28/18 0829)   amoxicillin-clavulanate (AUGMENTIN) 875-125 MG per tablet 1 tablet (1 tablet Oral Given 12/28/18 2148)  amLODipine (NORVASC) tablet 2.5 mg (2.5 mg Oral Given 12/28/18 0829)  imipramine (TOFRANIL-PM) capsule 75 mg (75 mg Oral Given 12/28/18 2147)  triazolam (HALCION) tablet 0.25 mg (0.25 mg Oral Given 12/28/18 2148)  levothyroxine (SYNTHROID) tablet 100 mcg (100 mcg Oral Given 12/28/18 0830)  pantoprazole (PROTONIX) EC tablet 40 mg (40 mg Oral Given 12/28/18 0830)  loratadine (CLARITIN) tablet 10 mg (10 mg Oral Given 12/28/18 0829)  fluticasone (FLONASE) 50 MCG/ACT nasal spray 1 spray (1 spray Each Nare Not Given 12/28/18 0830)  montelukast (SINGULAIR) tablet 10 mg (10 mg Oral Given 12/28/18 2147)  acetaminophen (TYLENOL) tablet 650 mg (650 mg Oral Given 12/28/18 1719)  ondansetron (ZOFRAN) injection 4 mg (4 mg Intravenous Given 12/28/18 1415)  heparin injection 5,000 Units (5,000 Units Subcutaneous Given 12/28/18 2148)  nitroGLYCERIN (NITROSTAT) SL tablet 0.4 mg (0.4 mg Sublingual Given 12/28/18 1627)  polyethylene glycol (MIRALAX / GLYCOLAX) packet 17 g (17 g Oral Not Given 12/28/18 0815)  predniSONE (DELTASONE) tablet 20 mg (has no administration in time range)  Followed by  predniSONE (DELTASONE) tablet 10 mg (has no administration in time range)  insulin lispro (HUMALOG) KwikPen 0-15 Units (11 Units Subcutaneous Given 12/28/18 1643)  insulin lispro (HUMALOG) KwikPen 0-5 Units (2 Units Subcutaneous Given 12/28/18 2150)  hydrOXYzine (ATARAX/VISTARIL) tablet 10 mg (10 mg Oral Given 12/28/18 1720)  magnesium sulfate IVPB 2 g 50 mL (0 g Intravenous Stopped 12/27/18 2114)  predniSONE (DELTASONE) tablet 30 mg (30 mg Oral Given 12/28/18 0829)  aspirin chewable tablet 324 mg (324 mg Oral Given 12/28/18 0324)     Initial Impression / Assessment and Plan / ED Course  I have reviewed the triage vital signs and the nursing notes.  Pertinent labs & imaging results that were available during my care of the patient were reviewed by me  and considered in my medical decision making (see chart for details).  Of note, this patient was evaluated in the Emergency Department for the symptoms described in the history of present illness. She was evaluated in the context of the global COVID-19 pandemic, which necessitated consideration that the patient might be at risk for infection with the SARS-CoV-2 virus that causes COVID-19. Institutional protocols and algorithms that pertain to the evaluation of patients at risk for COVID-19 are in a state of rapid change based on information released by regulatory bodies including the CDC and federal and state organizations. These policies and algorithms were followed during the patient's care in the ED. During this patient encounter, the patient was wearing a mask, and throughout this encounter I was wearing at least a surgical mask.  I was not within 6 feet of this patient for more than 15 minutes without eye protection when they were not wearing mask.   Differentials considered: COVID-19 illness, pneumonia, bronchitis, viral URI, Unstable angina, NSTEMI, HF exacerbation   EM Physician interpretation of Labs:  [name] No leukocytosis . Hyperglycemic with blood glucose of 366 . Metabolic panel with normal bicarb and normal anion gap. . Elevated high-sensitivity troponin (44), though unchanged on repeat at 2-hours   Medical Decision Making:  [name] is a 85 y.o. female with the above past medical history who presented to the ED today after sudden onset substernal burning-like chest discomfort that is now resolved after receiving 2 sublingual nitroglycerin by EMS.  Please see the HPI above for further details above her presenting symptoms and additional pertinent information provided by the patient's daughter.  She arrived afebrile and hemodynamically stable.   She was continuously monitored and frequently reassessed while in the emergency department and she did not have any recurrence of the  chest discomfort that brought her to the ED.  Labs and imaging have been reviewed.   After discussion with inpatient hospitalist team, I discussed the patient's case with the on-call cardiology fellow in regards to recommendation for initiation of treatment dose anticoagulation for unstable angina.  Cardiology fellow does not recommend starting heparin at this time, but to let them know if she does develop recurrence of her symptoms or new concerning symptoms for ACS.  Patient was admitted to the hospitalist service.  The care of this patient was supervised by Dr. Nanda Quinton, who agreed with the plan and management of the patient.   Disposition: Admit   Final Clinical Impressions(s) / ED Diagnoses   Final diagnoses:  Unstable angina ([place])  Viral syndrome      Jefm Petty, MD 12/29/18 0109    Margette Fast, MD 12/29/18 1102

## 2018-12-28 ENCOUNTER — Observation Stay (HOSPITAL_BASED_OUTPATIENT_CLINIC_OR_DEPARTMENT_OTHER): Payer: Medicare Other

## 2018-12-28 DIAGNOSIS — I361 Nonrheumatic tricuspid (valve) insufficiency: Secondary | ICD-10-CM

## 2018-12-28 DIAGNOSIS — K5909 Other constipation: Secondary | ICD-10-CM | POA: Diagnosis not present

## 2018-12-28 DIAGNOSIS — I209 Angina pectoris, unspecified: Secondary | ICD-10-CM

## 2018-12-28 DIAGNOSIS — I25110 Atherosclerotic heart disease of native coronary artery with unstable angina pectoris: Secondary | ICD-10-CM | POA: Diagnosis not present

## 2018-12-28 DIAGNOSIS — J329 Chronic sinusitis, unspecified: Secondary | ICD-10-CM

## 2018-12-28 DIAGNOSIS — Z20828 Contact with and (suspected) exposure to other viral communicable diseases: Secondary | ICD-10-CM | POA: Diagnosis not present

## 2018-12-28 DIAGNOSIS — I351 Nonrheumatic aortic (valve) insufficiency: Secondary | ICD-10-CM | POA: Diagnosis not present

## 2018-12-28 DIAGNOSIS — K581 Irritable bowel syndrome with constipation: Secondary | ICD-10-CM | POA: Diagnosis not present

## 2018-12-28 DIAGNOSIS — I2510 Atherosclerotic heart disease of native coronary artery without angina pectoris: Secondary | ICD-10-CM

## 2018-12-28 DIAGNOSIS — E119 Type 2 diabetes mellitus without complications: Secondary | ICD-10-CM

## 2018-12-28 DIAGNOSIS — I10 Essential (primary) hypertension: Secondary | ICD-10-CM | POA: Diagnosis not present

## 2018-12-28 DIAGNOSIS — E039 Hypothyroidism, unspecified: Secondary | ICD-10-CM | POA: Diagnosis not present

## 2018-12-28 DIAGNOSIS — Z794 Long term (current) use of insulin: Secondary | ICD-10-CM | POA: Diagnosis not present

## 2018-12-28 DIAGNOSIS — E785 Hyperlipidemia, unspecified: Secondary | ICD-10-CM | POA: Diagnosis not present

## 2018-12-28 DIAGNOSIS — D72829 Elevated white blood cell count, unspecified: Secondary | ICD-10-CM | POA: Diagnosis not present

## 2018-12-28 DIAGNOSIS — M3500 Sicca syndrome, unspecified: Secondary | ICD-10-CM | POA: Diagnosis not present

## 2018-12-28 LAB — URINALYSIS, ROUTINE W REFLEX MICROSCOPIC
Bacteria, UA: NONE SEEN
Bilirubin Urine: NEGATIVE
Glucose, UA: >=500 mg/dL — AB
Ketones, ur: NEGATIVE mg/dL
Nitrite: NEGATIVE
Protein, ur: 100 mg/dL — AB
Specific Gravity, Urine: 1.009 (ref 1.005–1.030)
pH: 8 (ref 5.0–8.0)

## 2018-12-28 LAB — GLUCOSE, CAPILLARY
Glucose-Capillary: 182 mg/dL — ABNORMAL HIGH (ref 70–99)
Glucose-Capillary: 144 mg/dL — ABNORMAL HIGH (ref 70–99)
Glucose-Capillary: 193 mg/dL — ABNORMAL HIGH (ref 70–99)
Glucose-Capillary: 319 mg/dL — ABNORMAL HIGH (ref 70–99)
Glucose-Capillary: 248 mg/dL — ABNORMAL HIGH (ref 70–99)

## 2018-12-28 LAB — TROPONIN I (HIGH SENSITIVITY)
Troponin I (High Sensitivity): 42 ng/L — ABNORMAL HIGH (ref <18)
Troponin I (High Sensitivity): 35 ng/L — ABNORMAL HIGH (ref <18)
Troponin I (High Sensitivity): 43 ng/L — ABNORMAL HIGH (ref <18)

## 2018-12-28 LAB — ECHOCARDIOGRAM COMPLETE
Height: 62 in
Weight: 2168 oz

## 2018-12-28 LAB — HEMOGLOBIN A1C
Hgb A1c MFr Bld: 9 % — ABNORMAL HIGH (ref 4.8–5.6)
Mean Plasma Glucose: 211.6 mg/dL

## 2018-12-28 LAB — SARS CORONAVIRUS 2 BY RT PCR (HOSPITAL ORDER, PERFORMED IN ~~LOC~~ HOSPITAL LAB): SARS Coronavirus 2: NEGATIVE

## 2018-12-28 MED ORDER — PREDNISONE 20 MG PO TABS
30.0000 mg | ORAL_TABLET | Freq: Once | ORAL | Status: AC
  Administered 2018-12-28: 30 mg via ORAL
  Filled 2018-12-28: qty 2

## 2018-12-28 MED ORDER — INSULIN ASPART 100 UNIT/ML ~~LOC~~ SOLN: 0.0000 [IU] | Freq: Every day | SUBCUTANEOUS | Status: DC

## 2018-12-28 MED ORDER — ASPIRIN 81 MG PO CHEW
324.0000 mg | CHEWABLE_TABLET | Freq: Once | ORAL | Status: AC
  Administered 2018-12-28: 324 mg via ORAL
  Filled 2018-12-28: qty 4

## 2018-12-28 MED ORDER — HYDROXYZINE HCL 10 MG PO TABS
10.0000 mg | ORAL_TABLET | Freq: Four times a day (QID) | ORAL | Status: DC | PRN
  Administered 2018-12-28 – 2018-12-29 (×2): 10 mg via ORAL
  Filled 2018-12-28 (×2): qty 1

## 2018-12-28 MED ORDER — INSULIN LISPRO (1 UNIT DIAL) 100 UNIT/ML (KWIKPEN)
0.0000 [IU] | PEN_INJECTOR | Freq: Every day | SUBCUTANEOUS | Status: DC
  Administered 2018-12-28: 2 [IU] via SUBCUTANEOUS
  Administered 2018-12-30: 3 [IU] via SUBCUTANEOUS
  Filled 2018-12-28 (×2): qty 3

## 2018-12-28 MED ORDER — MONTELUKAST SODIUM 10 MG PO TABS
10.0000 mg | ORAL_TABLET | Freq: Every day | ORAL | Status: DC
  Administered 2018-12-28 – 2018-12-30 (×3): 10 mg via ORAL
  Filled 2018-12-28 (×3): qty 1

## 2018-12-28 MED ORDER — IMIPRAMINE PAMOATE 75 MG PO CAPS
75.0000 mg | ORAL_CAPSULE | Freq: Every day | ORAL | Status: DC
  Administered 2018-12-28 – 2018-12-30 (×3): 75 mg via ORAL
  Filled 2018-12-28 (×5): qty 1

## 2018-12-28 MED ORDER — AMOXICILLIN-POT CLAVULANATE 875-125 MG PO TABS
1.0000 | ORAL_TABLET | Freq: Two times a day (BID) | ORAL | Status: DC
  Administered 2018-12-28 – 2018-12-31 (×7): 1 via ORAL
  Filled 2018-12-28 (×7): qty 1

## 2018-12-28 MED ORDER — INSULIN ASPART 100 UNIT/ML ~~LOC~~ SOLN: 0.0000 [IU] | Freq: Three times a day (TID) | SUBCUTANEOUS | Status: DC

## 2018-12-28 MED ORDER — PREDNISONE 20 MG PO TABS
20.0000 mg | ORAL_TABLET | Freq: Every day | ORAL | Status: AC
  Administered 2018-12-29 – 2018-12-30 (×2): 20 mg via ORAL
  Filled 2018-12-28 (×3): qty 1

## 2018-12-28 MED ORDER — NITROGLYCERIN 0.4 MG SL SUBL
0.4000 mg | SUBLINGUAL_TABLET | SUBLINGUAL | Status: DC | PRN
  Administered 2018-12-28 – 2018-12-30 (×5): 0.4 mg via SUBLINGUAL
  Filled 2018-12-28 (×5): qty 1

## 2018-12-28 MED ORDER — ONDANSETRON HCL 4 MG/2ML IJ SOLN
4.0000 mg | Freq: Four times a day (QID) | INTRAMUSCULAR | Status: DC | PRN
  Administered 2018-12-28 – 2018-12-30 (×2): 4 mg via INTRAVENOUS
  Filled 2018-12-28 (×2): qty 2

## 2018-12-28 MED ORDER — TRIAZOLAM 0.125 MG PO TABS
0.2500 mg | ORAL_TABLET | Freq: Every day | ORAL | Status: DC
  Administered 2018-12-28 – 2018-12-30 (×3): 0.25 mg via ORAL
  Filled 2018-12-28 (×3): qty 2

## 2018-12-28 MED ORDER — ASPIRIN EC 81 MG PO TBEC
81.0000 mg | DELAYED_RELEASE_TABLET | Freq: Every day | ORAL | Status: DC
  Administered 2018-12-28 – 2018-12-31 (×3): 81 mg via ORAL
  Filled 2018-12-28 (×4): qty 1

## 2018-12-28 MED ORDER — INSULIN LISPRO (1 UNIT DIAL) 100 UNIT/ML (KWIKPEN)
0.0000 [IU] | PEN_INJECTOR | Freq: Three times a day (TID) | SUBCUTANEOUS | Status: DC
  Administered 2018-12-28: 11 [IU] via SUBCUTANEOUS
  Administered 2018-12-29 (×2): 8 [IU] via SUBCUTANEOUS
  Administered 2018-12-29: 3 [IU] via SUBCUTANEOUS
  Administered 2018-12-30: 5 [IU] via SUBCUTANEOUS
  Administered 2018-12-30: 3 [IU] via SUBCUTANEOUS
  Administered 2018-12-31: 5 [IU] via SUBCUTANEOUS
  Administered 2018-12-31: 2 [IU] via SUBCUTANEOUS
  Filled 2018-12-28: qty 3

## 2018-12-28 MED ORDER — PREDNISONE 10 MG PO TABS: 10.0000 mg | ORAL_TABLET | Freq: Every day | ORAL | Status: DC

## 2018-12-28 MED ORDER — POLYETHYLENE GLYCOL 3350 17 G PO PACK
17.0000 g | PACK | Freq: Every day | ORAL | Status: DC
  Administered 2018-12-29: 17 g via ORAL
  Filled 2018-12-28 (×2): qty 1

## 2018-12-28 MED ORDER — FLUTICASONE PROPIONATE 50 MCG/ACT NA SUSP
1.0000 | Freq: Every day | NASAL | Status: DC
  Administered 2018-12-29: 1 via NASAL
  Filled 2018-12-28: qty 16

## 2018-12-28 MED ORDER — PANTOPRAZOLE SODIUM 40 MG PO TBEC
40.0000 mg | DELAYED_RELEASE_TABLET | Freq: Every day | ORAL | Status: DC
  Administered 2018-12-28 – 2018-12-31 (×4): 40 mg via ORAL
  Filled 2018-12-28 (×4): qty 1

## 2018-12-28 MED ORDER — LEVOTHYROXINE SODIUM 100 MCG PO TABS
100.0000 ug | ORAL_TABLET | Freq: Every day | ORAL | Status: DC
  Administered 2018-12-28 – 2018-12-29 (×2): 100 ug via ORAL
  Filled 2018-12-28 (×2): qty 1

## 2018-12-28 MED ORDER — HEPARIN SODIUM (PORCINE) 5000 UNIT/ML IJ SOLN
5000.0000 [IU] | Freq: Three times a day (TID) | INTRAMUSCULAR | Status: DC
  Administered 2018-12-28 – 2018-12-29 (×3): 5000 [IU] via SUBCUTANEOUS
  Filled 2018-12-28 (×3): qty 1

## 2018-12-28 MED ORDER — LORATADINE 10 MG PO TABS
10.0000 mg | ORAL_TABLET | Freq: Every day | ORAL | Status: DC
  Administered 2018-12-28 – 2018-12-31 (×4): 10 mg via ORAL
  Filled 2018-12-28 (×4): qty 1

## 2018-12-28 MED ORDER — ACETAMINOPHEN 325 MG PO TABS
650.0000 mg | ORAL_TABLET | ORAL | Status: DC | PRN
  Administered 2018-12-28: 650 mg via ORAL
  Filled 2018-12-28: qty 2

## 2018-12-28 MED ORDER — AMLODIPINE BESYLATE 2.5 MG PO TABS
2.5000 mg | ORAL_TABLET | Freq: Every day | ORAL | Status: DC
  Administered 2018-12-28 – 2018-12-31 (×4): 2.5 mg via ORAL
  Filled 2018-12-28 (×4): qty 1

## 2018-12-28 NOTE — Progress Notes (Signed)
Pt complained of chest pain 6/10. Nitro SL given. VS checked (see flowhseets) EKG done. MD aware. Pt stated 4/10 CP. Second nitro SL given. RN about to page cardio NP but the pt said the pain is gone, 0/10. Will monitor accordingly.

## 2018-12-28 NOTE — Progress Notes (Signed)
PROGRESS NOTE    [name]  NFA:[id number] DOB: 1930-10-25 DOA: 12/27/2018 PCP: Merrilee Seashore, MD    Brief Narrative:  85 y.o. female with medical history significant of Sjogren disease, scleroderma on chronic steroids (managed by Dr. Estanislado Pandy), chronic sinusitis (managed by Dr. Lucia Gaskins with ENT), emphysema and abnormal CT with probable [place] (followed by pulmonology), type 2 diabetes, hypertension, hyperlipidemia, hypothyroidism, CAD status post PCI presenting to the hospital for evaluation of chest pain.  Received nitroglycerin x2 by EMS and had relief.  Chest pain-free upon arrival to the ED.  Patient states she has had 3 stents placed in her heart and her cardiologist is Dr. Dorris Carnes.  States for the past 2 days she is experiencing substernal pressure-like chest pain which radiates to both sides of her jaw.  Chest pain is only exertional and resolves in a few minutes after she rests.  It is associated with dyspnea and nausea.  States last night when she had chest pain again it was relieved after EMS gave her nitroglycerin.  She thinks she had a fever at home.  Reports having chronic sinus infections for which she was recently started on prednisone and Augmentin.  Denies any chills or cough.  Denies any vomiting or abdominal pain.  Reports having chronic constipation and her last bowel movement was a week ago.  States she has been passing a lot of gas.  She does not like taking laxatives as they give her diarrhea.  ED Course: Hemodynamically stable.  Afebrile and no leukocytosis.  High-sensitivity troponin elevated 44 >44.  EKG not suggestive of ACS.  Blood glucose 366, bicarb 27, anion gap 11.  Chest x-ray personally reviewed showing no acute cardiopulmonary process.  Assessment & Plan:   Principal Problem:   Angina pectoris ([place]) Active Problems:   HTN (hypertension)   Chronic constipation   Type 2 diabetes mellitus (HCC)   CAD (coronary artery disease)   Chronic sinusitis     Angina, history of CAD status post multiple stents History concerning for anginal symptoms as chest pain is exertional and resolves with rest.   Hemodynamically stable.  High-sensitivity troponin elevated but overall stable thus far. -Cardiology following   EKG not suggestive of ACS.  Chest x-ray showing no active disease.  Stress test done in March 2019 was low risk. -Continued on Aspirin 81 mg, statin -Sublingual nitroglycerin as needed -Echocardiogram performed and reviewed. Normal LVEF  Uncontrolled type 2 diabetes Last A1c 8.0 in February 2020.  Blood glucose 366 on arrival.  Labs not suggestive of DKA. -Continued on SSI coverage.  Chronic sinusitis -Afebrile and no leukocytosis.  Followed by ENT.  Recently started on a prednisone taper and Augmentin by her pulmonologist's office. -Continued on steroid taper and antibiotic  Chronic constipation -Reported last bowel movement was one week ago.   -Presently no complaints of abdominal pain and exam benign. -Continued on MiraLAX  Hypertension -Continued on home amlodipine  Hypothyroidism -Continue home Synthroid as tolerated  DVT prophylaxis: Heparin subQ Code Status: Full Family Communication: Pt in room, family not at bedside Disposition Plan: Uncertain at this time  Consultants:   Cardiology  Procedures:     Antimicrobials: Anti-infectives (From admission, onward)   Start     Dose/Rate Route Frequency Ordered Stop   12/28/18 1000  amoxicillin-clavulanate (AUGMENTIN) 875-125 MG per tablet 1 tablet     1 tablet Oral 2 times daily 12/28/18 0047         Subjective: Reports residual mid-sternal discomfort  Objective:  Vitals:   12/28/18 0230 12/28/18 0310 12/28/18 0810 12/28/18 0944  BP: (!) 139/52 (!) 159/74 (!) 155/41 96/81  Pulse: 74 79 75 72  Resp: 14 18 18 17   Temp:  98.2 F (36.8 C) 98.1 F (36.7 C) 97.8 F (36.6 C)  TempSrc:  Oral Oral Oral  SpO2: 99% 100% 99% 99%  Weight:  61.5 kg     Height:  5\' 2"  (1.575 m)      Intake/Output Summary (Last 24 hours) at 12/28/2018 1334 Last data filed at 12/28/2018 1258 Gross per 24 hour  Intake 40 ml  Output 700 ml  Net -660 ml   Filed Weights   12/28/18 0310  Weight: 61.5 kg    Examination:  General exam: Appears calm and comfortable  Respiratory system: Clear to auscultation. Respiratory effort normal. Cardiovascular system: S1 & S2 heard, RRR, chest pain not made worse on palpation Gastrointestinal system: Abdomen is nondistended, soft and nontender. No organomegaly or masses felt. Normal bowel sounds heard. Central nervous system: Alert and oriented. No focal neurological deficits. Extremities: Symmetric 5 x 5 power. Skin: No rashes, lesions  Psychiatry: Judgement and insight appear normal. Mood & affect appropriate.   Data Reviewed: I have personally reviewed following labs and imaging studies  CBC: Recent Labs  Lab 12/27/18 1652  WBC 9.4  HGB 12.6  HCT 38.8  MCV 83.8  PLT 885   Basic Metabolic Panel: Recent Labs  Lab 12/27/18 1652  NA 130*  K 3.7  CL 92*  CO2 27  GLUCOSE 366*  BUN 17  CREATININE 0.88  CALCIUM 8.4*  MG 1.5*   GFR: Estimated Creatinine Clearance: 40.4 mL/min (by C-G formula based on SCr of 0.88 mg/dL). Liver Function Tests: No results for input(s): AST, ALT, ALKPHOS, BILITOT, PROT, ALBUMIN in the last 168 hours. No results for input(s): LIPASE, AMYLASE in the last 168 hours. No results for input(s): AMMONIA in the last 168 hours. Coagulation Profile: No results for input(s): INR, PROTIME in the last 168 hours. Cardiac Enzymes: No results for input(s): CKTOTAL, CKMB, CKMBINDEX, TROPONINI in the last 168 hours. BNP (last 3 results) No results for input(s): PROBNP in the last 8760 hours. HbA1C: Recent Labs    12/27/18 1652  HGBA1C 9.0*   CBG: Recent Labs  Lab 12/28/18 0326 12/28/18 0614 12/28/18 1139  GLUCAP 182* 144* 193*   Lipid Profile: No results for input(s):  CHOL, HDL, LDLCALC, TRIG, CHOLHDL, LDLDIRECT in the last 72 hours. Thyroid Function Tests: No results for input(s): TSH, T4TOTAL, FREET4, T3FREE, THYROIDAB in the last 72 hours. Anemia Panel: No results for input(s): VITAMINB12, FOLATE, FERRITIN, TIBC, IRON, RETICCTPCT in the last 72 hours. Sepsis Labs: No results for input(s): PROCALCITON, LATICACIDVEN in the last 168 hours.  Recent Results (from the past 240 hour(s))  SARS Coronavirus 2 (CEPHEID- Performed in [place] hospital lab), Hosp Order     Status: None   Collection Time: 12/28/18 12:05 AM   Specimen: Nasopharyngeal Swab  Result Value Ref Range Status   SARS Coronavirus 2 NEGATIVE NEGATIVE Final    Comment: (NOTE) If result is NEGATIVE SARS-CoV-2 target nucleic acids are NOT DETECTED. The SARS-CoV-2 RNA is generally detectable in upper and lower  respiratory specimens during the acute phase of infection. The lowest  concentration of SARS-CoV-2 viral copies this assay can detect is 250  copies / mL. A negative result does not preclude SARS-CoV-2 infection  and should not be used as the sole basis for treatment or other  patient management decisions.  A negative result may occur with  improper specimen collection / handling, submission of specimen other  than nasopharyngeal swab, presence of viral mutation(s) within the  areas targeted by this assay, and inadequate number of viral copies  (<250 copies / mL). A negative result must be combined with clinical  observations, patient history, and epidemiological information. If result is POSITIVE SARS-CoV-2 target nucleic acids are DETECTED. The SARS-CoV-2 RNA is generally detectable in upper and lower  respiratory specimens dur ing the acute phase of infection.  Positive  results are indicative of active infection with SARS-CoV-2.  Clinical  correlation with patient history and other diagnostic information is  necessary to determine patient infection status.  Positive results  do  not rule out bacterial infection or co-infection with other viruses. If result is PRESUMPTIVE POSTIVE SARS-CoV-2 nucleic acids MAY BE PRESENT.   A presumptive positive result was obtained on the submitted specimen  and confirmed on repeat testing.  While 2019 novel coronavirus  (SARS-CoV-2) nucleic acids may be present in the submitted sample  additional confirmatory testing may be necessary for epidemiological  and / or clinical management purposes  to differentiate between  SARS-CoV-2 and other Sarbecovirus currently known to infect humans.  If clinically indicated additional testing with an alternate test  methodology [phone]) is advised. The SARS-CoV-2 RNA is generally  detectable in upper and lower respiratory sp ecimens during the acute  phase of infection. The expected result is Negative. Fact Sheet for Patients:  StrictlyIdeas.no Fact Sheet for Healthcare Providers: BankingDealers.co.za This test is not yet approved or cleared by the [place] FDA and has been authorized for detection and/or diagnosis of SARS-CoV-2 by FDA under an Emergency Use Authorization (EUA).  This EUA will remain in effect (meaning this test can be used) for the duration of the COVID-19 declaration under Section 564(b)(1) of the Act, 21 U.S.C. section 360bbb-3(b)(1), unless the authorization is terminated or revoked sooner. Performed at [facility], [place] [address], [place], [place] [address]      Radiology Studies: Dg Chest Portable 1 View  Result Date: 12/27/2018 CLINICAL DATA:  Patient with fever, cough and sore throat. EXAM: PORTABLE CHEST 1 VIEW COMPARISON:  Chest radiograph 08/28/2018 FINDINGS: Monitoring leads overlie the patient. Stable cardiac and mediastinal contours. Aortic atherosclerosis. No large area pulmonary consolidation. No pleural effusion or pneumothorax. Thoracic spine degenerative changes. IMPRESSION: No acute  cardiopulmonary process. Electronically Signed   By: Lovey Newcomer M.D.   On: 12/27/2018 16:53    Scheduled Meds: . amLODipine  2.5 mg Oral Daily  . amoxicillin-clavulanate  1 tablet Oral BID  . aspirin EC  81 mg Oral Daily  . fluticasone  1 spray Each Nare Daily  . heparin  5,000 Units Subcutaneous Q8H  . imipramine  75 mg Oral QHS  . insulin aspart  0-15 Units Subcutaneous TID WC  . insulin aspart  0-5 Units Subcutaneous QHS  . levothyroxine  100 mcg Oral QAC breakfast  . loratadine  10 mg Oral Daily  . montelukast  10 mg Oral QHS  . pantoprazole  40 mg Oral Daily  . polyethylene glycol  17 g Oral Daily  . [START ON 12/29/2018] predniSONE  20 mg Oral Q breakfast   Followed by  . [START ON 12/31/2018] predniSONE  10 mg Oral Q breakfast  . triazolam  0.25 mg Oral QHS   Continuous Infusions:   LOS: 0 days   Marylu Lund, MD [facility] Pager On  Amion  If 7PM-7AM, please contact night-coverage 12/28/2018, 1:34 PM

## 2018-12-28 NOTE — Progress Notes (Signed)
  Echocardiogram 2D Echocardiogram has been performed.  [name] 12/28/2018, 11:02 AM

## 2018-12-28 NOTE — Consult Note (Addendum)
Cardiology Consult Note   Primary Physician: Merrilee Seashore, MD PCP-Cardiologist:  Dorris Carnes, MD  Reason for Consultation: Chest pain, elevated troponin  HPI:    [name] is seen today for evaluation of elevated troponin, chest pain at the request of Dr. Marlowe Sax.   She is a 85 y.o. female with history of CAD s/p PCI in 2007, 2009, 2010 (RCA with subsequent intervention on RCA stent and then LM), Sjogren disease, scleroderma on chronic steroids, chronic sinusitis, emphysema and abnormal CT with probable [place], type 2 diabetes, hypertension, hyperlipidemia, hypothyroidism, CAD status post PCI presenting to the hospital for evaluation of chest pain.   For the past 2 days she has had exertional substernal pressure-like chest pain which radiates to both sides of her jaw.  Resolves with rest and associated with dyspnea and nausea. Improved with NTG. Reports previous MI and PCI in the past (2007, 2010).  Also recently had fever and has had chronic sinus infections for which she was recently started on prednisone and Augmentin.  Denies any chills or cough.  Denies any vomiting or abdominal pain.   She also just returned 3 days ago from traveling out of state to [place].  Preceding the onset of chest discomfort today she had a headache, sore throat, dry mouth and chills for the past 2 days.  She reports chronic shortness of breath that is not any worse than normal at this time. COVID-19 PCR tonight is negative.  Review of Systems: [y] = yes, [ ]  = no   . General: Weight gain [ ] ; Weight loss [ ] ; Anorexia [ ] ; Fatigue [ ] ; Fever [ ] ; Chills [ ] ; Weakness [ ]   . Cardiac: Chest pain/pressure [ ] ; Resting SOB [ ] ; Exertional SOB [ ] ; Orthopnea [ ] ; Pedal Edema [ ] ; Palpitations [ ] ; Syncope [ ] ; Presyncope [ ] ; Paroxysmal nocturnal dyspnea[ ]   . Pulmonary: Cough [ ] ; Wheezing[ ] ; Hemoptysis[ ] ; Sputum [ ] ; Snoring [ ]   . GI: Vomiting[ ] ; Dysphagia[ ] ; Melena[ ] ; Hematochezia [ ] ;  Heartburn[ ] ; Abdominal pain [ ] ; Constipation [ ] ; Diarrhea [ ] ; BRBPR [ ]   . GU: Hematuria[ ] ; Dysuria [ ] ; Nocturia[ ]   . Vascular: Pain in legs with walking [ ] ; Pain in feet with lying flat [ ] ; Non-healing sores [ ] ; Stroke [ ] ; TIA [ ] ; Slurred speech [ ] ;  . Neuro: Headaches[ ] ; Vertigo[ ] ; Seizures[ ] ; Paresthesias[ ] ;Blurred vision [ ] ; Diplopia [ ] ; Vision changes [ ]   . Ortho/Skin: Arthritis [ ] ; Joint pain [ ] ; Muscle pain [ ] ; Joint swelling [ ] ; Back Pain [ ] ; Rash [ ]   . Psych: Depression[ ] ; Anxiety[ ]   . Heme: Bleeding problems [ ] ; Clotting disorders [ ] ; Anemia [ ]   . Endocrine: Diabetes [ ] ; Thyroid dysfunction[ ]   Home Medications Prior to Admission medications   Medication Sig Start Date End Date Taking? Authorizing Provider  amLODipine (NORVASC) 2.5 MG tablet Take 1 tablet (2.5 mg total) by mouth daily. 10/11/18  Yes Fay Records, MD  amoxicillin-clavulanate (AUGMENTIN) 875-125 MG tablet Take 1 tablet by mouth 2 (two) times daily. 12/24/18  Yes Lauraine Rinne, NP  aspirin EC 81 MG tablet Take 1 tablet (81 mg total) by mouth daily. 11/18/13  Yes Fay Records, MD  cetirizine (ZYRTEC) 10 MG tablet Take 10 mg by mouth daily as needed for allergies.   Yes [provider]  esomeprazole (NEXIUM) 40 MG capsule Take  40 mg by mouth daily.    Yes [provider]  fluticasone (FLONASE) 50 MCG/ACT nasal spray Place 1 spray into both nostrils daily. 07/11/17  Yes Ghimire, Henreitta Leber, MD  furosemide (LASIX) 20 MG tablet Take 1 tablet (20 mg total) by mouth daily. 09/03/18  Yes Daune Perch, NP  HYDROcodone-acetaminophen (NORCO) 7.5-325 MG tablet Take 0.5-1 tablets by mouth every 6 (six) hours as needed for moderate pain.    Yes [provider]  imipramine (TOFRANIL) 25 MG tablet Take 75 mg by mouth at bedtime.  05/26/09  Yes [provider]  insulin lispro (HUMALOG KWIKPEN) 100 UNIT/ML KwikPen Inject 5 Units into the skin 3 (three) times daily.    Yes  [provider]  levothyroxine (SYNTHROID, LEVOTHROID) 100 MCG tablet Take 100 mcg by mouth daily before breakfast.   Yes [provider]  montelukast (SINGULAIR) 10 MG tablet Take 10 mg by mouth at bedtime.  05/10/15  Yes [provider]  Naphazoline-Pheniramine (OPCON-A) 0.027-0.315 % SOLN Place 1-2 drops into both eyes as needed (for allergies).   Yes [provider]  predniSONE (DELTASONE) 10 MG tablet 4 tabs for 2 days, then 3 tabs for 2 days, 2 tabs for 2 days, then 1 tab for 2 days, then stop 12/24/18  Yes Lauraine Rinne, NP  triazolam (HALCION) 0.25 MG tablet Take 0.25 mg by mouth at bedtime.    Yes [provider]  predniSONE (DELTASONE) 5 MG tablet TAKE 1 TABLET ONCE A DAY WITH BREAKFAST Patient not taking: Reported on 12/28/2018 07/11/18   Chesley Mires, MD    Past Medical History: Past Medical History:  Diagnosis Date  . Adenomatous colon polyp   . Arthritis   . Blood transfusion   . Cancer [facility])    cervical cancer pre hysterectomy  . Cervical disc disease   . Chronic sinusitis   . CONSTIPATION, CHRONIC 06/26/2007   Qualifier: Diagnosis of  By: Nils Pyle CMA ([place]), Mearl Latin    . COPD (chronic obstructive pulmonary disease) ([place])   . Coronary artery disease    prior MI with stenting of RCA with repeat PTCA/stenting for ISR in 01/2008, stent to LM 11/2008  . CVA (cerebral infarction) 1991  . Depression   . Diabetes mellitus   . Diverticulosis   . 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 Dyslipidemia   . Emphysema   . Fibromyalgia   . GERD (gastroesophageal reflux disease)   . Hypertension   . Hypothyroidism   . IBS (irritable bowel syndrome)   . Legally blind   . Macular degeneration   . Myocardial infarction [facility]) may 27th 2007  . Pneumonia 07/16/2013    HX PNEUMONIA  . Pulmonary hypertension ([place])   . Raynaud's phenomenon   . Scleroderma ([place])   . Sjogren's disease ([employer])   . Sleep apnea    STOP BANG SCORE 5  . 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 Zenker's diverticulum     Past Surgical History: Past Surgical History:  Procedure Laterality Date  . APPENDECTOMY    . BALLOON DILATION N/A 07/15/2015   Procedure: BALLOON DILATION;  Surgeon: Milus Banister, MD;  Location: [facility];  Service: Endoscopy;  Laterality: N/A;  . BILATERAL OOPHORECTOMY  2002  . BRONCHOSCOPY  02-22-09  . CARDIAC CATHETERIZATION     x 3  . CATARACT EXTRACTION    . CHOLECYSTECTOMY  1965  . COLON SURGERY  2014   colon resection  .  colonectomy    . ESOPHAGOGASTRODUODENOSCOPY (EGD) WITH ESOPHAGEAL DILATION N/A 06/18/2013   Procedure: ESOPHAGOGASTRODUODENOSCOPY (EGD) WITH ESOPHAGEAL DILATION;  Surgeon: Inda Castle, MD;  Location: [place];  Service: Endoscopy;  Laterality: N/A;  . FLEXIBLE SIGMOIDOSCOPY N/A 07/15/2015   Procedure: FLEXIBLE SIGMOIDOSCOPY;  Surgeon: Milus Banister, MD;  Location: [facility];  Service: Endoscopy;  Laterality: N/A;  . lobe thyroid removal    . PROCTOSCOPY  03/18/2012   Procedure: PROCTOSCOPY;  Surgeon: Adin Hector, MD;  Location: [facility];  Service: General;  Laterality: N/A;  rigid procto  . RADIOLOGY WITH ANESTHESIA N/A 03/09/2015   Procedure: MRI CERVICAL SPINE WITHOUT AND LUMBER WITHOUT;  Surgeon: Medication Radiologist, MD;  Location: [place];  Service: Radiology;  Laterality: N/A;  . [place]   and implantation of a plate   . STENTED CARDIAC  ARTERY  2007 / 2007 / 2010   X 3 STENT  . THYROID LOBECTOMY  1991   removal of left lobe thyroid  . TOTAL ABDOMINAL HYSTERECTOMY  1973    Family History: Family History  Problem Relation Age of Onset  . Other Mother 42       Died from sepsis  . Stroke Father 26       died  . Heart disease Father   . Diabetes Father   . Cancer Sister        lung  . Diabetes Sister   . Cancer Brother        lung  . Diabetes Brother   . Diabetes Brother     Social History: Social History   Socioeconomic History  . Marital  status: Divorced    Spouse name: Not on file  . Number of children: Not on file  . Years of education: Not on file  . Highest education level: Not on file  Occupational History  . Occupation: retired    Fish farm manager: RETIRED  Social Needs  . Financial resource strain: Not on file  . Food insecurity    Worry: Not on file    Inability: Not on file  . Transportation needs    Medical: Not on file    Non-medical: Not on file  Tobacco Use  . Smoking status: Former Smoker    Packs/day: 2.00    Years: 40.00    Pack years: 80.00    Types: Cigarettes    Start date: 1949    Quit date: 1989    Years since quitting: 31.5  . Smokeless tobacco: Never Used  Substance and Sexual Activity  . Alcohol use: No  . Drug use: No  . Sexual activity: Not on file  Lifestyle  . Physical activity    Days per week: Not on file    Minutes per session: Not on file  . Stress: Not on file  Relationships  . Social Herbalist on phone: Patient refused    Gets together: Patient refused    Attends religious service: Patient refused    Active member of club or organization: Patient refused    Attends meetings of clubs or organizations: Patient refused    Relationship status: Patient refused  Other Topics Concern  . Not on file  Social History Narrative  . Not on file    Allergies:  Allergies  Allergen Reactions  . Flomax [Tamsulosin Hcl] Other (See Comments)    Made her 'trachea feel tight and couldn't swallow'  . Peanut Oil Anaphylaxis, Swelling, Rash and Other (See Comments)  Throat tightens, also  . Sulfa Antibiotics Anaphylaxis       . Titanium Other (See Comments)    Neck wouldn't heal with titanium cervical plate  . Yellow Dye Other (See Comments)    "Bee stings" all over body when eating/taking medications with red or yellow dye   . Lantus [Insulin Glargine] Other (See Comments)    Patient states "sulfate binder causes sinus infection/pneumonia".  Mack Hook [Levofloxacin In  D5w] Other (See Comments)    Muscle aches  . Metoprolol-Hydrochlorothiazide Other (See Comments)    Decreased B/P  . Norvasc [Amlodipine] Swelling and Other (See Comments)    BIL Ankle edema  . Novolog [Insulin Aspart]     Patient states "sulfate binder causes sinus infection/pneumonia". Tolerates Humalog.  [name] Penciclovir Other (See Comments)    Muscle Pain  . Senna [Sennosides] Other (See Comments)    Pt states that it causes a "stinging" sensation  . Statins Other (See Comments)    All of them make me "feel badly"  . Sulfonamide Derivatives Hives  . Adhesive [Tape] Hives and Rash  . Maxidex [Dexamethasone] Anxiety and Other (See Comments)    Insomnia and dizziness, too    Objective:    Vital Signs:   Temp:  [98.8 F (37.1 C)] 98.8 F (37.1 C) (07/03 1614) Pulse Rate:  [73-79] 74 (07/04 0230) Resp:  [11-21] 14 (07/04 0230) BP: (101-141)/(45-111) 139/52 (07/04 0230) SpO2:  [96 %-100 %] 99 % (07/04 0230)    Weight change: There were no vitals filed for this visit.  Intake/Output:  No intake or output data in the 24 hours ending 12/28/18 0246    Physical Exam    General:  Well appearing. No resp difficulty HEENT: normal Neck: supple. JVP . Carotids 2+ bilat; no bruits. No lymphadenopathy or thyromegaly appreciated. Cor: PMI nondisplaced. Regular rate & rhythm. No rubs, gallops or murmurs. Lungs: clear Abdomen: soft, nontender, nondistended. No hepatosplenomegaly. No bruits or masses. Good bowel sounds. Extremities: no cyanosis, clubbing, rash, edema Neuro: alert & orientedx3, cranial nerves grossly intact. moves all 4 extremities w/o difficulty. Affect pleasant  EKG    NSR with TWI in inferolateral leads, similar to prior ECGs dating back to 2019  Labs   Basic Metabolic Panel: Recent Labs  Lab 12/27/18 1652  NA 130*  K 3.7  CL 92*  CO2 27  GLUCOSE 366*  BUN 17  CREATININE 0.88  CALCIUM 8.4*  MG 1.5*    Liver Function Tests: No results for input(s):  AST, ALT, ALKPHOS, BILITOT, PROT, ALBUMIN in the last 168 hours. No results for input(s): LIPASE, AMYLASE in the last 168 hours. No results for input(s): AMMONIA in the last 168 hours.  CBC: Recent Labs  Lab 12/27/18 1652  WBC 9.4  HGB 12.6  HCT 38.8  MCV 83.8  PLT 207    Cardiac Enzymes: No results for input(s): CKTOTAL, CKMB, CKMBINDEX, TROPONINI in the last 168 hours.  BNP: BNP (last 3 results) No results for input(s): BNP in the last 8760 hours.  ProBNP (last 3 results) No results for input(s): PROBNP in the last 8760 hours.   CBG: No results for input(s): GLUCAP in the last 168 hours.  Coagulation Studies: No results for input(s): LABPROT, INR in the last 72 hours.   Imaging   Dg Chest Portable 1 View  Result Date: 12/27/2018 CLINICAL DATA:  Patient with fever, cough and sore throat. EXAM: PORTABLE CHEST 1 VIEW COMPARISON:  Chest radiograph 08/28/2018 FINDINGS: Monitoring leads overlie  the patient. Stable cardiac and mediastinal contours. Aortic atherosclerosis. No large area pulmonary consolidation. No pleural effusion or pneumothorax. Thoracic spine degenerative changes. IMPRESSION: No acute cardiopulmonary process. Electronically Signed   By: Lovey Newcomer M.D.   On: 12/27/2018 16:53      Medications:     Current Medications: . amLODipine  2.5 mg Oral Daily  . amoxicillin-clavulanate  1 tablet Oral BID  . aspirin  324 mg Oral Once  . aspirin EC  81 mg Oral Daily  . fluticasone  1 spray Each Nare Daily  . heparin  5,000 Units Subcutaneous Q8H  . imipramine  75 mg Oral QHS  . insulin aspart  0-15 Units Subcutaneous TID WC  . insulin aspart  0-5 Units Subcutaneous QHS  . levothyroxine  100 mcg Oral QAC breakfast  . loratadine  10 mg Oral Daily  . montelukast  10 mg Oral QHS  . pantoprazole  40 mg Oral Daily  . polyethylene glycol  17 g Oral Daily  . predniSONE  10 mg Oral See admin instructions  . triazolam  0.25 mg Oral QHS      Assessment/Plan   85 yo woman with CAD s/p previous PCI Sjogren's, Scleroderma Emphysema, BOOP T2DM, HTN, HLD, hypothryroidism  Admitted with chest pain, viral like illness, elevated troponin COVID-19 test negative Troponin levels are flat and ECG unchanged from prior ECGs last year which all suggest no ACS Echo from 05/2018 shows normal LVEF but elevated left sided filling pressure and normal PA pressure. Symptoms may be more related to CHF. Check BNP, new echo Cont aspirin 81, start atorvastatin 40 to optimize CAD therapy Appears to have had difficulty with increasing antianginals in the past due to relative hypotension Additional workup based on clinical course  Length of Stay: 0  Tressie Stalker, MD  Cardiology 12/28/2018, 2:46 AM

## 2018-12-28 NOTE — ED Notes (Signed)
Admitting provider bedside 

## 2018-12-29 DIAGNOSIS — R079 Chest pain, unspecified: Secondary | ICD-10-CM | POA: Diagnosis present

## 2018-12-29 DIAGNOSIS — I200 Unstable angina: Secondary | ICD-10-CM | POA: Diagnosis not present

## 2018-12-29 DIAGNOSIS — Z8673 Personal history of transient ischemic attack (TIA), and cerebral infarction without residual deficits: Secondary | ICD-10-CM | POA: Diagnosis not present

## 2018-12-29 DIAGNOSIS — Z7982 Long term (current) use of aspirin: Secondary | ICD-10-CM | POA: Diagnosis not present

## 2018-12-29 DIAGNOSIS — I209 Angina pectoris, unspecified: Secondary | ICD-10-CM | POA: Diagnosis not present

## 2018-12-29 DIAGNOSIS — E119 Type 2 diabetes mellitus without complications: Secondary | ICD-10-CM | POA: Diagnosis not present

## 2018-12-29 DIAGNOSIS — B349 Viral infection, unspecified: Secondary | ICD-10-CM | POA: Diagnosis present

## 2018-12-29 DIAGNOSIS — K219 Gastro-esophageal reflux disease without esophagitis: Secondary | ICD-10-CM | POA: Diagnosis present

## 2018-12-29 DIAGNOSIS — Z20828 Contact with and (suspected) exposure to other viral communicable diseases: Secondary | ICD-10-CM | POA: Diagnosis not present

## 2018-12-29 DIAGNOSIS — M349 Systemic sclerosis, unspecified: Secondary | ICD-10-CM | POA: Diagnosis present

## 2018-12-29 DIAGNOSIS — I10 Essential (primary) hypertension: Secondary | ICD-10-CM | POA: Diagnosis not present

## 2018-12-29 DIAGNOSIS — Z9049 Acquired absence of other specified parts of digestive tract: Secondary | ICD-10-CM | POA: Diagnosis not present

## 2018-12-29 DIAGNOSIS — J439 Emphysema, unspecified: Secondary | ICD-10-CM | POA: Diagnosis present

## 2018-12-29 DIAGNOSIS — E1100 Type 2 diabetes mellitus with hyperosmolarity without nonketotic hyperglycemic-hyperosmolar coma (NKHHC): Secondary | ICD-10-CM | POA: Diagnosis not present

## 2018-12-29 DIAGNOSIS — K5909 Other constipation: Secondary | ICD-10-CM | POA: Diagnosis not present

## 2018-12-29 DIAGNOSIS — E785 Hyperlipidemia, unspecified: Secondary | ICD-10-CM | POA: Diagnosis not present

## 2018-12-29 DIAGNOSIS — I25110 Atherosclerotic heart disease of native coronary artery with unstable angina pectoris: Secondary | ICD-10-CM | POA: Diagnosis not present

## 2018-12-29 DIAGNOSIS — Z981 Arthrodesis status: Secondary | ICD-10-CM | POA: Diagnosis not present

## 2018-12-29 DIAGNOSIS — M3500 Sicca syndrome, unspecified: Secondary | ICD-10-CM | POA: Diagnosis not present

## 2018-12-29 DIAGNOSIS — E039 Hypothyroidism, unspecified: Secondary | ICD-10-CM | POA: Diagnosis not present

## 2018-12-29 DIAGNOSIS — K581 Irritable bowel syndrome with constipation: Secondary | ICD-10-CM | POA: Diagnosis not present

## 2018-12-29 DIAGNOSIS — D72829 Elevated white blood cell count, unspecified: Secondary | ICD-10-CM | POA: Diagnosis not present

## 2018-12-29 DIAGNOSIS — I252 Old myocardial infarction: Secondary | ICD-10-CM | POA: Diagnosis not present

## 2018-12-29 DIAGNOSIS — J329 Chronic sinusitis, unspecified: Secondary | ICD-10-CM | POA: Diagnosis not present

## 2018-12-29 DIAGNOSIS — Z79890 Hormone replacement therapy (postmenopausal): Secondary | ICD-10-CM | POA: Diagnosis not present

## 2018-12-29 DIAGNOSIS — I351 Nonrheumatic aortic (valve) insufficiency: Secondary | ICD-10-CM | POA: Diagnosis not present

## 2018-12-29 DIAGNOSIS — J8489 Other specified interstitial pulmonary diseases: Secondary | ICD-10-CM | POA: Diagnosis present

## 2018-12-29 DIAGNOSIS — Z8541 Personal history of malignant neoplasm of cervix uteri: Secondary | ICD-10-CM | POA: Diagnosis not present

## 2018-12-29 DIAGNOSIS — Z794 Long term (current) use of insulin: Secondary | ICD-10-CM | POA: Diagnosis not present

## 2018-12-29 LAB — GLUCOSE, CAPILLARY
Glucose-Capillary: 165 mg/dL — ABNORMAL HIGH (ref 70–99)
Glucose-Capillary: 273 mg/dL — ABNORMAL HIGH (ref 70–99)
Glucose-Capillary: 274 mg/dL — ABNORMAL HIGH (ref 70–99)
Glucose-Capillary: 243 mg/dL — ABNORMAL HIGH (ref 70–99)

## 2018-12-29 MED ORDER — SODIUM CHLORIDE 0.9% FLUSH
3.0000 mL | Freq: Two times a day (BID) | INTRAVENOUS | Status: DC
  Administered 2018-12-29 – 2018-12-31 (×4): 3 mL via INTRAVENOUS

## 2018-12-29 MED ORDER — ASPIRIN 81 MG PO CHEW
81.0000 mg | CHEWABLE_TABLET | ORAL | Status: AC
  Administered 2018-12-30: 81 mg via ORAL
  Filled 2018-12-29: qty 1

## 2018-12-29 MED ORDER — LIDOCAINE-PRILOCAINE 2.5-2.5 % EX CREA
TOPICAL_CREAM | CUTANEOUS | Status: DC | PRN
  Filled 2018-12-29: qty 5

## 2018-12-29 MED ORDER — SODIUM CHLORIDE 0.9 % IV SOLN: 250.0000 mL | INTRAVENOUS | Status: DC | PRN

## 2018-12-29 MED ORDER — HEPARIN (PORCINE) 25000 UT/250ML-% IV SOLN
650.0000 [IU]/h | INTRAVENOUS | Status: DC
  Administered 2018-12-29: 800 [IU]/h via INTRAVENOUS
  Filled 2018-12-29: qty 250

## 2018-12-29 MED ORDER — SORBITOL 70 % SOLN
30.0000 mL | Freq: Every day | Status: DC | PRN
  Filled 2018-12-29: qty 30

## 2018-12-29 MED ORDER — SODIUM CHLORIDE 0.9 % WEIGHT BASED INFUSION
1.0000 mL/kg/h | INTRAVENOUS | Status: DC
  Administered 2018-12-29: 1 mL/kg/h via INTRAVENOUS

## 2018-12-29 MED ORDER — HEPARIN BOLUS VIA INFUSION
3500.0000 [IU] | Freq: Once | INTRAVENOUS | Status: AC
  Administered 2018-12-29: 3500 [IU] via INTRAVENOUS
  Filled 2018-12-29: qty 3500

## 2018-12-29 MED ORDER — SODIUM CHLORIDE 0.9% FLUSH: 3.0000 mL | INTRAVENOUS | Status: DC | PRN

## 2018-12-29 NOTE — Progress Notes (Signed)
Progress Note  Patient Name: [name] Date of Encounter: 12/29/2018  Primary Cardiologist: Dorris Carnes, MD   Subjective   No CP or dyspnea  Inpatient Medications    Scheduled Meds: . amLODipine  2.5 mg Oral Daily  . amoxicillin-clavulanate  1 tablet Oral BID  . aspirin EC  81 mg Oral Daily  . fluticasone  1 spray Each Nare Daily  . heparin  5,000 Units Subcutaneous Q8H  . imipramine  75 mg Oral QHS  . insulin lispro  0-15 Units Subcutaneous TID WC  . insulin lispro  0-5 Units Subcutaneous QHS  . levothyroxine  100 mcg Oral QAC breakfast  . loratadine  10 mg Oral Daily  . montelukast  10 mg Oral QHS  . pantoprazole  40 mg Oral Daily  . polyethylene glycol  17 g Oral Daily  . predniSONE  20 mg Oral Q breakfast   Followed by  . [START ON 12/31/2018] predniSONE  10 mg Oral Q breakfast  . triazolam  0.25 mg Oral QHS   Continuous Infusions:  PRN Meds: acetaminophen, hydrOXYzine, nitroGLYCERIN, ondansetron (ZOFRAN) IV   Vital Signs    Vitals:   12/28/18 1927 12/28/18 2348 12/29/18 0611 12/29/18 0619  BP: (!) 143/76 (!) 152/68 (!) 166/58 (!) 145/66  Pulse: 89 78 81 77  Resp: 18 18 18    Temp: 98.7 F (37.1 C) 98.2 F (36.8 C) 98.1 F (36.7 C)   TempSrc: Oral Oral Oral   SpO2: 100% 97% 97%   Weight:   56.9 kg   Height:        Intake/Output Summary (Last 24 hours) at 12/29/2018 0914 Last data filed at 12/29/2018 0600 Gross per 24 hour  Intake 0 ml  Output 1900 ml  Net -1900 ml   Last 3 Weights 12/29/2018 12/28/2018 10/11/2018  Weight (lbs) 125 lb 8 oz 135 lb 8 oz 127 lb  Weight (kg) 56.926 kg 61.462 kg 57.607 kg      Telemetry    Sinus - Personally Reviewed  Physical Exam   GEN: No acute distress.   Neck: No JVD Cardiac: RRR, no murmurs, rubs, or gallops.  Respiratory: Clear to auscultation bilaterally. GI: Soft, nontender, non-distended  MS: No edema Neuro:  Nonfocal  Psych: Normal affect   Labs    High Sensitivity Troponin:   Recent Labs  Lab  12/27/18 1652 12/27/18 2043 12/27/18 2329 12/28/18 0437 12/28/18 1052  TROPONINIHS 44* 44* 42* 35* 43*      Chemistry Recent Labs  Lab 12/27/18 1652  NA 130*  K 3.7  CL 92*  CO2 27  GLUCOSE 366*  BUN 17  CREATININE 0.88  CALCIUM 8.4*  GFRNONAA 60*  GFRAA >60  ANIONGAP 11     Hematology Recent Labs  Lab 12/27/18 1652  WBC 9.4  RBC 4.63  HGB 12.6  HCT 38.8  MCV 83.8  MCH 27.2  MCHC 32.5  RDW 13.6  PLT 207    Radiology    Dg Chest Portable 1 View  Result Date: 12/27/2018 CLINICAL DATA:  Patient with fever, cough and sore throat. EXAM: PORTABLE CHEST 1 VIEW COMPARISON:  Chest radiograph 08/28/2018 FINDINGS: Monitoring leads overlie the patient. Stable cardiac and mediastinal contours. Aortic atherosclerosis. No large area pulmonary consolidation. No pleural effusion or pneumothorax. Thoracic spine degenerative changes. IMPRESSION: No acute cardiopulmonary process. Electronically Signed   By: Lovey Newcomer M.D.   On: 12/27/2018 16:53    Patient Profile     85 y.o. female  with past medical history of coronary artery disease, scleroderma, Sjogren's, COPD, diabetes, hypertension, hyperlipidemia for evaluation of chest pain.  Assessment & Plan    1 unstable angina-patient describes burning chest pain radiating to her jaws associated with nausea and dyspnea.  Description is concerning and is occurring at rest.  Troponins are flat and not consistent with myocardial infarction.  However given description, feel definitive evaluation is warranted.  Plan cardiac catheterization.  The risks and benefits including myocardial infarction, CVA and death discussed and she agrees to proceed.  We will plan tomorrow.  We will continue aspirin.  She is intolerant to statins.  Add IV heparin.  Add metoprolol 12.5 mg twice daily.  2 hypertension-blood pressure has been minimally elevated.  Add metoprolol and follow.  3 hyperlipidemia-intolerant to statins.  Follow-up in lipid clinic for  consideration of [place].  4 recent fever-now afebrile.  Coronavirus negative.  For questions or updates, please contact [place] Please consult [email or web address] for contact info under        Signed, Kirk Ruths, MD  12/29/2018, 9:14 AM

## 2018-12-29 NOTE — Progress Notes (Signed)
Reviewed and agree with assessment/plan.   Marinna Blane, MD [place] [facility] 06/21/2016, 12:24 PM Pager:  [phone]  

## 2018-12-29 NOTE — Progress Notes (Signed)
ANTICOAGULATION CONSULT NOTE - Initial Consult  Pharmacy Consult for Heparin Indication: chest pain/ACS  Allergies  Allergen Reactions  . Flomax [Tamsulosin Hcl] Other (See Comments)    Made her 'trachea feel tight and couldn't swallow'  . Peanut Oil Anaphylaxis, Swelling, Rash and Other (See Comments)    Throat tightens, also  . Sulfa Antibiotics Anaphylaxis       . Titanium Other (See Comments)    Neck wouldn't heal with titanium cervical plate  . Yellow Dye Other (See Comments)    "Bee stings" all over body when eating/taking medications with red or yellow dye   . Lantus [Insulin Glargine] Other (See Comments)    Patient states "sulfate binder causes sinus infection/pneumonia".  Mack Hook [Levofloxacin In D5w] Other (See Comments)    Muscle aches  . Metoprolol-Hydrochlorothiazide Other (See Comments)    Decreased B/P  . Norvasc [Amlodipine] Swelling and Other (See Comments)    BIL Ankle edema  . Novolog [Insulin Aspart]     Patient states "sulfate binder causes sinus infection/pneumonia". Tolerates Humalog.  [name] Penciclovir Other (See Comments)    Muscle Pain  . Senna [Sennosides] Other (See Comments)    Pt states that it causes a "stinging" sensation  . Statins Other (See Comments)    All of them make me "feel badly"  . Sulfonamide Derivatives Hives  . Adhesive [Tape] Hives and Rash  . Maxidex [Dexamethasone] Anxiety and Other (See Comments)    Insomnia and dizziness, too    Patient Measurements: Height: 5\' 2"  (157.5 cm) Weight: 125 lb 8 oz (56.9 kg) IBW/kg (Calculated) : 50.1 Heparin Dosing Weight: 56.9  Vital Signs: Temp: 98.1 F (36.7 C) (07/05 0611) Temp Source: Oral (07/05 0611) BP: 145/66 (07/05 0619) Pulse Rate: 77 (07/05 0619)  Labs: Recent Labs    12/27/18 1652  12/27/18 2329 12/28/18 0437 12/28/18 1052  HGB 12.6  --   --   --   --   HCT 38.8  --   --   --   --   PLT 207  --   --   --   --   CREATININE 0.88  --   --   --   --   TROPONINIHS  44*   < > 42* 35* 43*   < > = values in this interval not displayed.    Estimated Creatinine Clearance: 37 mL/min (by C-G formula based on SCr of 0.88 mg/dL).   Medical History: Past Medical History:  Diagnosis Date  . Adenomatous colon polyp   . Arthritis   . Blood transfusion   . Cancer [facility])    cervical cancer pre hysterectomy  . Cervical disc disease   . Chronic sinusitis   . CONSTIPATION, CHRONIC 06/26/2007   Qualifier: Diagnosis of  By: Nils Pyle CMA ([place]), Mearl Latin    . COPD (chronic obstructive pulmonary disease) ([place])   . Coronary artery disease    prior MI with stenting of RCA with repeat PTCA/stenting for ISR in 01/2008, stent to LM 11/2008  . CVA (cerebral infarction) 1991  . Depression   . Diabetes mellitus   . Diverticulosis   . 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 Dyslipidemia   . Emphysema   . Fibromyalgia   . GERD (gastroesophageal reflux disease)   . Hypertension   . Hypothyroidism   . IBS (irritable bowel syndrome)   . Legally blind   . Macular degeneration   . Myocardial infarction [facility]) may 27th 2007  . Pneumonia 07/16/2013  HX PNEUMONIA  . Pulmonary hypertension ([place])   . Raynaud's phenomenon   . Scleroderma ([place])   . Sjogren's disease ([place])   . Sleep apnea    STOP BANG SCORE 5  . 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 Zenker's diverticulum     Assessment: CC/HPI: CP, burning chest pain radiating to her jaws associated with nausea and dyspnea.  PMH: see list  Anticoag: USAP with h/o CAD. Start IV heparin. Baseline CBC WNL  Goal of Therapy:  Heparin level 0.3-0.7 units/ml Monitor platelets by anticoagulation protocol: Yes   Plan:  IV heparin 3500 unit IV bolus Heparin infusion at 800 units/hr Check heparin level in 6-8 hrs. Daily HL and CBC   Melea Prezioso S. Alford [place], PharmD, BCPS Clinical Staff Pharmacist Eilene Ghazi Stillinger 12/29/2018,9:42 AM

## 2018-12-29 NOTE — Progress Notes (Signed)
PROGRESS NOTE    [name]  DVV:[id number] DOB: [birth year] DOA: 12/27/2018 PCP: Merrilee Seashore, MD    Brief Narrative:  85 y.o. female with medical history significant of Sjogren disease, scleroderma on chronic steroids (managed by Dr. Estanislado Pandy), chronic sinusitis (managed by Dr. Lucia Gaskins with ENT), emphysema and abnormal CT with probable Boop (followed by pulmonology), type 2 diabetes, hypertension, hyperlipidemia, hypothyroidism, CAD status post PCI presenting to the hospital for evaluation of chest pain.  Received nitroglycerin x2 by EMS and had relief.  Chest pain-free upon arrival to the ED.  Patient states she has had 3 stents placed in her heart and her cardiologist is Dr. Dorris Carnes.  States for the past 2 days she is experiencing substernal pressure-like chest pain which radiates to both sides of her jaw.  Chest pain is only exertional and resolves in a few minutes after she rests.  It is associated with dyspnea and nausea.  States last night when she had chest pain again it was relieved after EMS gave her nitroglycerin.  She thinks she had a fever at home.  Reports having chronic sinus infections for which she was recently started on prednisone and Augmentin.  Denies any chills or cough.  Denies any vomiting or abdominal pain.  Reports having chronic constipation and her last bowel movement was a week ago.  States she has been passing a lot of gas.  She does not like taking laxatives as they give her diarrhea.  ED Course: Hemodynamically stable.  Afebrile and no leukocytosis.  High-sensitivity troponin elevated 44 >44.  EKG not suggestive of ACS.  Blood glucose 366, bicarb 27, anion gap 11.  Chest x-ray personally reviewed showing no acute cardiopulmonary process.  Assessment & Plan:   Principal Problem:   Angina pectoris ([place]) Active Problems:   HTN (hypertension)   Chronic constipation   Type 2 diabetes mellitus (HCC)   CAD (coronary artery disease)   Chronic sinusitis    Chest pain    Angina, history of CAD status post multiple stents History concerning for anginal symptoms as chest pain is exertional and resolves with rest.   Hemodynamically stable.  High-sensitivity troponin elevated but overall stable thus far. -Cardiology following   EKG not suggestive of ACS.  Chest x-ray showing no active disease.  Stress test done in March 2019 was low risk. -Continued on Aspirin 81 mg, statin -Sublingual nitroglycerin as needed -Echocardiogram performed and reviewed. Normal LVEF -Discussed with Cardiology. Plan for heart cath 7/6  Uncontrolled type 2 diabetes Last A1c 8.0 in February 2020.  Blood glucose 366 on arrival.  Labs not suggestive of DKA. -Continue on SSI coverage as needed -Multiple insulin allergies documented  Chronic sinusitis -Afebrile and no leukocytosis.  Followed by ENT.  Recently started on a prednisone taper and Augmentin by her pulmonologist's office. -Continued on steroid taper and antibiotic -Stable at present  Chronic constipation -Reported last bowel movement was one week ago.   -Presently no complaints of abdominal pain and exam benign. -Will continue on cathartics as needed  Hypertension -Continued on home amlodipine -BP stable at this time  Hypothyroidism -Continue home Synthroid as tolerated -Stable at present  DVT prophylaxis: Heparin subQ Code Status: Full Family Communication: Pt in room, family not at bedside Disposition Plan: Uncertain at this time  Consultants:   Cardiology  Procedures:     Antimicrobials: Anti-infectives (From admission, onward)   Start     Dose/Rate Route Frequency Ordered Stop   12/28/18 1000  amoxicillin-clavulanate (AUGMENTIN) 875-125  MG per tablet 1 tablet     1 tablet Oral 2 times daily 12/28/18 0047        Subjective: Reported chest pain overnight, improved with nitroglycerin  Objective: Vitals:   12/28/18 1927 12/28/18 2348 12/29/18 0611 12/29/18 0619  BP: (!)  143/76 (!) 152/68 (!) 166/58 (!) 145/66  Pulse: 89 78 81 77  Resp: 18 18 18    Temp: 98.7 F (37.1 C) 98.2 F (36.8 C) 98.1 F (36.7 C)   TempSrc: Oral Oral Oral   SpO2: 100% 97% 97%   Weight:   56.9 kg   Height:        Intake/Output Summary (Last 24 hours) at 12/29/2018 1348 Last data filed at 12/29/2018 0600 Gross per 24 hour  Intake 0 ml  Output 1200 ml  Net -1200 ml   Filed Weights   12/28/18 0310 12/29/18 0611  Weight: 61.5 kg 56.9 kg    Examination: General exam: Awake, laying in bed, in nad Respiratory system: Normal respiratory effort, no wheezing Cardiovascular system: regular rate, s1, s2 Gastrointestinal system: Soft, nondistended, positive BS Central nervous system: CN2-12 grossly intact, strength intact Extremities: Perfused, no clubbing Skin: Normal skin turgor, no notable skin lesions seen Psychiatry: Mood normal // no visual hallucinations   Data Reviewed: I have personally reviewed following labs and imaging studies  CBC: Recent Labs  Lab 12/27/18 1652  WBC 9.4  HGB 12.6  HCT 38.8  MCV 83.8  PLT 751   Basic Metabolic Panel: Recent Labs  Lab 12/27/18 1652  NA 130*  K 3.7  CL 92*  CO2 27  GLUCOSE 366*  BUN 17  CREATININE 0.88  CALCIUM 8.4*  MG 1.5*   GFR: Estimated Creatinine Clearance: 37 mL/min (by C-G formula based on SCr of 0.88 mg/dL). Liver Function Tests: No results for input(s): AST, ALT, ALKPHOS, BILITOT, PROT, ALBUMIN in the last 168 hours. No results for input(s): LIPASE, AMYLASE in the last 168 hours. No results for input(s): AMMONIA in the last 168 hours. Coagulation Profile: No results for input(s): INR, PROTIME in the last 168 hours. Cardiac Enzymes: No results for input(s): CKTOTAL, CKMB, CKMBINDEX, TROPONINI in the last 168 hours. BNP (last 3 results) No results for input(s): PROBNP in the last 8760 hours. HbA1C: Recent Labs    12/27/18 1652  HGBA1C 9.0*   CBG: Recent Labs  Lab 12/28/18 1139 12/28/18 1633  12/28/18 2121 12/29/18 0612 12/29/18 1202  GLUCAP 193* 319* 248* 165* 273*   Lipid Profile: No results for input(s): CHOL, HDL, LDLCALC, TRIG, CHOLHDL, LDLDIRECT in the last 72 hours. Thyroid Function Tests: No results for input(s): TSH, T4TOTAL, FREET4, T3FREE, THYROIDAB in the last 72 hours. Anemia Panel: No results for input(s): VITAMINB12, FOLATE, FERRITIN, TIBC, IRON, RETICCTPCT in the last 72 hours. Sepsis Labs: No results for input(s): PROCALCITON, LATICACIDVEN in the last 168 hours.  Recent Results (from the past 240 hour(s))  SARS Coronavirus 2 (CEPHEID- Performed in [place] hospital lab), Hosp Order     Status: None   Collection Time: 12/28/18 12:05 AM   Specimen: Nasopharyngeal Swab  Result Value Ref Range Status   SARS Coronavirus 2 NEGATIVE NEGATIVE Final    Comment: (NOTE) If result is NEGATIVE SARS-CoV-2 target nucleic acids are NOT DETECTED. The SARS-CoV-2 RNA is generally detectable in upper and lower  respiratory specimens during the acute phase of infection. The lowest  concentration of SARS-CoV-2 viral copies this assay can detect is 250  copies / mL. A negative result does  not preclude SARS-CoV-2 infection  and should not be used as the sole basis for treatment or other  patient management decisions.  A negative result may occur with  improper specimen collection / handling, submission of specimen other  than nasopharyngeal swab, presence of viral mutation(s) within the  areas targeted by this assay, and inadequate number of viral copies  (<250 copies / mL). A negative result must be combined with clinical  observations, patient history, and epidemiological information. If result is POSITIVE SARS-CoV-2 target nucleic acids are DETECTED. The SARS-CoV-2 RNA is generally detectable in upper and lower  respiratory specimens dur ing the acute phase of infection.  Positive  results are indicative of active infection with SARS-CoV-2.  Clinical  correlation  with patient history and other diagnostic information is  necessary to determine patient infection status.  Positive results do  not rule out bacterial infection or co-infection with other viruses. If result is PRESUMPTIVE POSTIVE SARS-CoV-2 nucleic acids MAY BE PRESENT.   A presumptive positive result was obtained on the submitted specimen  and confirmed on repeat testing.  While 2019 novel coronavirus  (SARS-CoV-2) nucleic acids may be present in the submitted sample  additional confirmatory testing may be necessary for epidemiological  and / or clinical management purposes  to differentiate between  SARS-CoV-2 and other Sarbecovirus currently known to infect humans.  If clinically indicated additional testing with an alternate test  methodology [phone]) is advised. The SARS-CoV-2 RNA is generally  detectable in upper and lower respiratory sp ecimens during the acute  phase of infection. The expected result is Negative. Fact Sheet for Patients:  StrictlyIdeas.no Fact Sheet for Healthcare Providers: BankingDealers.co.za This test is not yet approved or cleared by the [place] FDA and has been authorized for detection and/or diagnosis of SARS-CoV-2 by FDA under an Emergency Use Authorization (EUA).  This EUA will remain in effect (meaning this test can be used) for the duration of the COVID-19 declaration under Section 564(b)(1) of the Act, 21 U.S.C. section 360bbb-3(b)(1), unless the authorization is terminated or revoked sooner. Performed at [facility], [place] [address] [place], [place] [address]      Radiology Studies: Dg Chest Portable 1 View  Result Date: 12/27/2018 CLINICAL DATA:  Patient with fever, cough and sore throat. EXAM: PORTABLE CHEST 1 VIEW COMPARISON:  Chest radiograph 08/28/2018 FINDINGS: Monitoring leads overlie the patient. Stable cardiac and mediastinal contours. Aortic atherosclerosis. No large area  pulmonary consolidation. No pleural effusion or pneumothorax. Thoracic spine degenerative changes. IMPRESSION: No acute cardiopulmonary process. Electronically Signed   By: Lovey Newcomer M.D.   On: 12/27/2018 16:53    Scheduled Meds: . amLODipine  2.5 mg Oral Daily  . amoxicillin-clavulanate  1 tablet Oral BID  . aspirin EC  81 mg Oral Daily  . fluticasone  1 spray Each Nare Daily  . imipramine  75 mg Oral QHS  . insulin lispro  0-15 Units Subcutaneous TID WC  . insulin lispro  0-5 Units Subcutaneous QHS  . levothyroxine  100 mcg Oral QAC breakfast  . loratadine  10 mg Oral Daily  . montelukast  10 mg Oral QHS  . pantoprazole  40 mg Oral Daily  . polyethylene glycol  17 g Oral Daily  . predniSONE  20 mg Oral Q breakfast   Followed by  . [START ON 12/31/2018] predniSONE  10 mg Oral Q breakfast  . sodium chloride flush  3 mL Intravenous Q12H  . triazolam  0.25 mg Oral QHS  Continuous Infusions: . heparin 800 Units/hr (12/29/18 1235)     LOS: 0 days   Marylu Lund, MD [facility] Pager On Amion  If 7PM-7AM, please contact night-coverage 12/29/2018, 1:48 PM

## 2018-12-30 ENCOUNTER — Encounter (HOSPITAL_COMMUNITY)
Admission: EM | Disposition: A | Discharge status: Home / Self Care | Payer: Self-pay | Source: Home / Self Care | Attending: Internal Medicine

## 2018-12-30 ENCOUNTER — Other Ambulatory Visit: Payer: Self-pay

## 2018-12-30 DIAGNOSIS — E1100 Type 2 diabetes mellitus with hyperosmolarity without nonketotic hyperglycemic-hyperosmolar coma (NKHHC): Secondary | ICD-10-CM

## 2018-12-30 DIAGNOSIS — I10 Essential (primary) hypertension: Secondary | ICD-10-CM

## 2018-12-30 DIAGNOSIS — E785 Hyperlipidemia, unspecified: Secondary | ICD-10-CM

## 2018-12-30 HISTORY — PX: LEFT HEART CATH AND CORONARY ANGIOGRAPHY: CATH118249

## 2018-12-30 HISTORY — PX: CORONARY STENT INTERVENTION: CATH118234

## 2018-12-30 LAB — BASIC METABOLIC PANEL
Anion gap: 12 (ref 5–15)
BUN: 17 mg/dL (ref 8–23)
CO2: 26 mmol/L (ref 22–32)
Calcium: 8.6 mg/dL — ABNORMAL LOW (ref 8.9–10.3)
Chloride: 96 mmol/L — ABNORMAL LOW (ref 98–111)
Creatinine, Ser: 0.78 mg/dL (ref 0.44–1.00)
GFR calc Af Amer: >60 mL/min (ref >60)
GFR calc non Af Amer: >60 mL/min (ref >60)
Glucose, Bld: 210 mg/dL — ABNORMAL HIGH (ref 70–99)
Potassium: 3.5 mmol/L (ref 3.5–5.1)
Sodium: 134 mmol/L — ABNORMAL LOW (ref 135–145)

## 2018-12-30 LAB — GLUCOSE, CAPILLARY
Glucose-Capillary: 206 mg/dL — ABNORMAL HIGH (ref 70–99)
Glucose-Capillary: 180 mg/dL — ABNORMAL HIGH (ref 70–99)
Glucose-Capillary: 199 mg/dL — ABNORMAL HIGH (ref 70–99)
Glucose-Capillary: 293 mg/dL — ABNORMAL HIGH (ref 70–99)

## 2018-12-30 LAB — CBC
HCT: 37.7 % (ref 36.0–46.0)
Hemoglobin: 12.5 g/dL (ref 12.0–15.0)
MCH: 27.9 pg (ref 26.0–34.0)
MCHC: 33.2 g/dL (ref 30.0–36.0)
MCV: 84.2 fL (ref 80.0–100.0)
Platelets: 247 10*3/uL (ref 150–400)
RBC: 4.48 MIL/uL (ref 3.87–5.11)
RDW: 14 % (ref 11.5–15.5)
WBC: 12.6 10*3/uL — ABNORMAL HIGH (ref 4.0–10.5)
nRBC: 0 % (ref 0.0–0.2)

## 2018-12-30 LAB — LIPID PANEL
Cholesterol: 198 mg/dL (ref 0–200)
HDL: 51 mg/dL (ref >40)
LDL Cholesterol: 119 mg/dL — ABNORMAL HIGH (ref 0–99)
Total CHOL/HDL Ratio: 3.9 RATIO
Triglycerides: 139 mg/dL (ref <150)
VLDL: 28 mg/dL (ref 0–40)

## 2018-12-30 LAB — HEPARIN LEVEL (UNFRACTIONATED)
Heparin Unfractionated: 0.48 IU/mL (ref 0.30–0.70)
Heparin Unfractionated: 0.76 IU/mL — ABNORMAL HIGH (ref 0.30–0.70)

## 2018-12-30 LAB — POCT ACTIVATED CLOTTING TIME
Activated Clotting Time: 274 seconds
Activated Clotting Time: 378 seconds

## 2018-12-30 LAB — NOVEL CORONAVIRUS, NAA: SARS-CoV-2, NAA: NOT DETECTED

## 2018-12-30 SURGERY — LEFT HEART CATH AND CORONARY ANGIOGRAPHY
Anesthesia: LOCAL

## 2018-12-30 MED ORDER — INSULIN NPH (HUMAN) (ISOPHANE) 100 UNIT/ML ~~LOC~~ SUSP
6.0000 [IU] | Freq: Two times a day (BID) | SUBCUTANEOUS | Status: DC
  Administered 2018-12-30 – 2018-12-31 (×2): 6 [IU] via SUBCUTANEOUS
  Filled 2018-12-30 (×2): qty 10

## 2018-12-30 MED ORDER — HEPARIN (PORCINE) IN NACL 1000-0.9 UT/500ML-% IV SOLN
INTRAVENOUS | Status: DC | PRN
  Administered 2018-12-30: 500 mL

## 2018-12-30 MED ORDER — FENTANYL CITRATE (PF) 100 MCG/2ML IJ SOLN
INTRAMUSCULAR | Status: AC
  Filled 2018-12-30: qty 2

## 2018-12-30 MED ORDER — MIDAZOLAM HCL 2 MG/2ML IJ SOLN
INTRAMUSCULAR | Status: AC
  Filled 2018-12-30: qty 2

## 2018-12-30 MED ORDER — HYDRALAZINE HCL 20 MG/ML IJ SOLN: 10.0000 mg | INTRAMUSCULAR | Status: AC | PRN

## 2018-12-30 MED ORDER — LEVOTHYROXINE SODIUM 100 MCG PO TABS
100.0000 ug | ORAL_TABLET | Freq: Every day | ORAL | Status: DC
  Administered 2018-12-30 – 2018-12-31 (×2): 100 ug via ORAL
  Filled 2018-12-30 (×2): qty 1

## 2018-12-30 MED ORDER — SODIUM CHLORIDE 0.9% FLUSH: 3.0000 mL | INTRAVENOUS | Status: DC | PRN

## 2018-12-30 MED ORDER — SODIUM CHLORIDE 0.9 % IV SOLN: 250.0000 mL | INTRAVENOUS | Status: DC | PRN

## 2018-12-30 MED ORDER — FENTANYL CITRATE (PF) 100 MCG/2ML IJ SOLN
INTRAMUSCULAR | Status: DC | PRN
  Administered 2018-12-30 (×2): 25 ug via INTRAVENOUS

## 2018-12-30 MED ORDER — HEPARIN SODIUM (PORCINE) 1000 UNIT/ML IJ SOLN
INTRAMUSCULAR | Status: AC
  Filled 2018-12-30: qty 1

## 2018-12-30 MED ORDER — NITROGLYCERIN 1 MG/10 ML FOR IR/CATH LAB
INTRA_ARTERIAL | Status: AC
  Filled 2018-12-30: qty 10

## 2018-12-30 MED ORDER — LIDOCAINE HCL (PF) 1 % IJ SOLN
INTRAMUSCULAR | Status: AC
  Filled 2018-12-30: qty 30

## 2018-12-30 MED ORDER — IOHEXOL 350 MG/ML SOLN
INTRAVENOUS | Status: DC | PRN
  Administered 2018-12-30: 90 mL via INTRA_ARTERIAL

## 2018-12-30 MED ORDER — HEPARIN (PORCINE) IN NACL 1000-0.9 UT/500ML-% IV SOLN
INTRAVENOUS | Status: AC
  Filled 2018-12-30: qty 1000

## 2018-12-30 MED ORDER — VERAPAMIL HCL 2.5 MG/ML IV SOLN
INTRAVENOUS | Status: DC | PRN
  Administered 2018-12-30: 10 mL via INTRA_ARTERIAL

## 2018-12-30 MED ORDER — SODIUM CHLORIDE 0.9 % WEIGHT BASED INFUSION: 1.0000 mL/kg/h | INTRAVENOUS | Status: AC

## 2018-12-30 MED ORDER — HEPARIN SODIUM (PORCINE) 1000 UNIT/ML IJ SOLN
INTRAMUSCULAR | Status: DC | PRN
  Administered 2018-12-30: 3000 [IU] via INTRAVENOUS

## 2018-12-30 MED ORDER — SODIUM CHLORIDE 0.9% FLUSH
3.0000 mL | Freq: Two times a day (BID) | INTRAVENOUS | Status: DC
  Administered 2018-12-31: 3 mL via INTRAVENOUS

## 2018-12-30 MED ORDER — NITROGLYCERIN 1 MG/10 ML FOR IR/CATH LAB
INTRA_ARTERIAL | Status: DC | PRN
  Administered 2018-12-30 (×3): 150 ug via INTRACORONARY

## 2018-12-30 MED ORDER — LIDOCAINE HCL (PF) 1 % IJ SOLN
INTRAMUSCULAR | Status: DC | PRN
  Administered 2018-12-30: 2 mL

## 2018-12-30 MED ORDER — HEPARIN SODIUM (PORCINE) 1000 UNIT/ML IJ SOLN
INTRAMUSCULAR | Status: DC | PRN
  Administered 2018-12-30: 3000 [IU] via INTRAVENOUS
  Administered 2018-12-30: 2000 [IU] via INTRAVENOUS

## 2018-12-30 MED ORDER — TICAGRELOR 90 MG PO TABS
90.0000 mg | ORAL_TABLET | Freq: Two times a day (BID) | ORAL | Status: DC
  Administered 2018-12-30 – 2018-12-31 (×2): 90 mg via ORAL
  Filled 2018-12-30 (×2): qty 1

## 2018-12-30 MED ORDER — VERAPAMIL HCL 2.5 MG/ML IV SOLN
INTRAVENOUS | Status: AC
  Filled 2018-12-30: qty 2

## 2018-12-30 MED ORDER — MIDAZOLAM HCL 2 MG/2ML IJ SOLN
INTRAMUSCULAR | Status: DC | PRN
  Administered 2018-12-30 (×2): 1 mg via INTRAVENOUS

## 2018-12-30 MED ORDER — IOHEXOL 350 MG/ML SOLN
INTRAVENOUS | Status: DC | PRN
  Administered 2018-12-30: 40 mL via INTRA_ARTERIAL

## 2018-12-30 MED ORDER — MIDAZOLAM HCL 2 MG/2ML IJ SOLN
INTRAMUSCULAR | Status: DC | PRN
  Administered 2018-12-30 (×3): 1 mg via INTRAVENOUS

## 2018-12-30 MED ORDER — SODIUM CHLORIDE 0.9 % IV SOLN
INTRAVENOUS | Status: AC | PRN
  Administered 2018-12-30: 57 mL/h via INTRAVENOUS

## 2018-12-30 MED ORDER — TICAGRELOR 90 MG PO TABS
ORAL_TABLET | ORAL | Status: AC
  Filled 2018-12-30: qty 2

## 2018-12-30 MED ORDER — TICAGRELOR 90 MG PO TABS
ORAL_TABLET | ORAL | Status: DC | PRN
  Administered 2018-12-30: 180 mg via ORAL

## 2018-12-30 MED ORDER — FENTANYL CITRATE (PF) 100 MCG/2ML IJ SOLN
INTRAMUSCULAR | Status: DC | PRN
  Administered 2018-12-30 (×3): 25 ug via INTRAVENOUS

## 2018-12-30 SURGICAL SUPPLY — 25 items
BALLN SAPPHIRE 2.0X15 (BALLOONS) ×2
BALLN SAPPHIRE 2.5X15 (BALLOONS) ×2
BALLN SAPPHIRE ~~LOC~~ 2.75X15 (BALLOONS) ×2 IMPLANT
BALLN ~~LOC~~ EMERGE MR 3.25X20 (BALLOONS) ×2
BALLOON SAPPHIRE 2.0X15 (BALLOONS) ×1 IMPLANT
BALLOON SAPPHIRE 2.5X15 (BALLOONS) ×1 IMPLANT
BALLOON ~~LOC~~ EMERGE MR 3.25X20 (BALLOONS) IMPLANT
CATH INFINITI 4FR JL3.5 (CATHETERS) ×1 IMPLANT
CATH INFINITI MULTIPACK ANG 4F (CATHETERS) ×2 IMPLANT
CATH LAUNCHER 5F EBU3.5 (CATHETERS) ×2 IMPLANT
CATH LAUNCHER 5F JR4 (CATHETERS) ×2 IMPLANT
DEVICE RAD COMP TR BAND LRG (VASCULAR PRODUCTS) ×2 IMPLANT
GLIDESHEATH SLEND SS 6F .021 (SHEATH) ×2 IMPLANT
GUIDEWIRE INQWIRE 1.5J.035X260 (WIRE) IMPLANT
INQWIRE 1.5J .035X260CM (WIRE) ×2
KIT ENCORE 26 ADVANTAGE (KITS) ×1 IMPLANT
KIT HEART LEFT (KITS) ×2 IMPLANT
PACK CARDIAC CATHETERIZATION (CUSTOM PROCEDURE TRAY) ×2 IMPLANT
STENT RESOLUTE ONYX 2.5X18 (Permanent Stent) ×1 IMPLANT
STENT RESOLUTE ONYX 3.0X12 (Permanent Stent) ×1 IMPLANT
STENT RESOLUTE ONYX 3.0X30 (Permanent Stent) ×1 IMPLANT
TRANSDUCER W/STOPCOCK (MISCELLANEOUS) ×2 IMPLANT
TUBING CIL FLEX 10 FLL-RA (TUBING) ×2 IMPLANT
WIRE COUGAR XT STRL 190CM (WIRE) ×4 IMPLANT
WIRE HI TORQ VERSACORE-J 145CM (WIRE) ×1 IMPLANT

## 2018-12-30 NOTE — Consult Note (Signed)
   [facility] CM Inpatient Consult   12/30/2018  [name] 18-Aug-1930 [id number]  Patient screened for high risk score for unplanned readmission score and for 2 hospitalizations in the past 6 months.  Review of patient's medical record reveals patient is admitted for chest pain.  Patient has a history with [facility] through [place] [facility]].  Plan:  Inpatient TOC LCSW notes reviewed and patient had no additional needs noted and continue with [facility].  Confirmed with Drucie Ip that the patient is still active in the program.  For questions contact:   Natividad Brood, RN BSN [facility] Liaison  [phone] business mobile phone Toll free office [phone]  Fax number: [phone] [email or web address]

## 2018-12-30 NOTE — Progress Notes (Addendum)
2 IV team RNs unable to get a 2nd IV, even while using ultrasound machine. Time of pt's procedure unknown at this time. IV team instructed this RN to have day shift IV team notified. Will continue to monitor.

## 2018-12-30 NOTE — Progress Notes (Signed)
PROGRESS NOTE    [name]  BMW:[id number] DOB: July 24, 1930 DOA: 12/27/2018 PCP: Merrilee Seashore, MD    Brief Narrative:  85 y.o. female with medical history significant of Sjogren disease, scleroderma on chronic steroids (managed by Dr. Estanislado Pandy), chronic sinusitis (managed by Dr. Lucia Gaskins with ENT), emphysema and abnormal CT with probable Boop (followed by pulmonology), type 2 diabetes, hypertension, hyperlipidemia, hypothyroidism, CAD status post PCI presenting to the hospital for evaluation of chest pain.  Received nitroglycerin x2 by EMS and had relief.  Chest pain-free upon arrival to the ED.  Patient states she has had 3 stents placed in her heart and her cardiologist is Dr. Dorris Carnes.  States for the past 2 days she is experiencing substernal pressure-like chest pain which radiates to both sides of her jaw.  Chest pain is only exertional and resolves in a few minutes after she rests.  It is associated with dyspnea and nausea.  States last night when she had chest pain again it was relieved after EMS gave her nitroglycerin.  She thinks she had a fever at home.  Reports having chronic sinus infections for which she was recently started on prednisone and Augmentin.  Denies any chills or cough.  Denies any vomiting or abdominal pain.  Reports having chronic constipation and her last bowel movement was a week ago.  States she has been passing a lot of gas.  She does not like taking laxatives as they give her diarrhea.  ED Course: Hemodynamically stable.  Afebrile and no leukocytosis.  High-sensitivity troponin elevated 44 >44.  EKG not suggestive of ACS.  Blood glucose 366, bicarb 27, anion gap 11.  Chest x-ray personally reviewed showing no acute cardiopulmonary process.  Assessment & Plan:   Principal Problem:   Angina pectoris ([place]) Active Problems:   HTN (hypertension)   Chronic constipation   Type 2 diabetes mellitus (HCC)   CAD (coronary artery disease)   Chronic sinusitis    Chest pain    Angina, history of CAD status post multiple stents History concerning for anginal symptoms as chest pain is exertional and resolves with rest.   Hemodynamically stable.  High-sensitivity troponin elevated but overall stable thus far. -Cardiology following   EKG not suggestive of ACS.  Chest x-ray showing no active disease.  Stress test done in March 2019 was low risk. -Continued on Aspirin 81 mg, statin -Sublingual nitroglycerin as needed -Echocardiogram performed and reviewed. Normal LVEF -Cardiology following. Plan for [facility] later today  Uncontrolled type 2 diabetes Last A1c 8.0 in February 2020.  Blood glucose 366 on arrival.  Labs not suggestive of DKA. -Continue on SSI coverage as needed -Glucose in the low 200's while on prednisone. Will add low dose NPH -Multiple insulin allergies documented  Chronic sinusitis -Afebrile and no leukocytosis.  Followed by ENT.  Recently started on a prednisone taper and Augmentin by her pulmonologist's office. -Continued on steroid taper and antibiotic, which may contribute to mild leukocytosis, see below -Stable at present  Chronic constipation -Reported last bowel movement was one week ago.   -Presently no complaints of abdominal pain and exam benign. -Multiple BM noted with cathartics  Hypertension -Continued on home amlodipine -BP stable currently  Hypothyroidism -Continue home Synthroid as tolerated -Stable currently  Leukocytosis -Afebrile -Suspect in setting of prednisone -Repeat cbc in AM  DVT prophylaxis: Heparin subQ Code Status: Full Family Communication: Pt in room, family not at bedside Disposition Plan: Uncertain at this time  Consultants:   Cardiology  Procedures:  Antimicrobials: Anti-infectives (From admission, onward)   Start     Dose/Rate Route Frequency Ordered Stop   12/28/18 1000  amoxicillin-clavulanate (AUGMENTIN) 875-125 MG per tablet 1 tablet     1 tablet Oral 2 times  daily 12/28/18 0047        Subjective: Feeling anxious about heart cath today  Objective: Vitals:   12/30/18 0737 12/30/18 1210 12/30/18 1440 12/30/18 1452  BP: (!) 117/40 (!) 136/51 (!) 155/67 125/66  Pulse: 73 77 81 83  Resp: 18 15    Temp: 98 F (36.7 C) 99 F (37.2 C) 98.6 F (37 C)   TempSrc: Oral Oral Oral   SpO2: 96% 100% 99% 93%  Weight:      Height:        Intake/Output Summary (Last 24 hours) at 12/30/2018 1455 Last data filed at 12/30/2018 1422 Gross per 24 hour  Intake 962.93 ml  Output 1400 ml  Net -437.07 ml   Filed Weights   12/28/18 0310 12/29/18 0611 12/30/18 0425  Weight: 61.5 kg 56.9 kg 59.7 kg    Examination: General exam: Conversant, in no acute distress Respiratory system: normal chest rise, clear, no audible wheezing Cardiovascular system: regular rhythm, s1-s2 Gastrointestinal system: Nondistended, nontender, pos BS Central nervous system: No seizures, no tremors Extremities: No cyanosis, no joint deformities Skin: No rashes, no pallor Psychiatry: Affect normal // no auditory hallucinations   Data Reviewed: I have personally reviewed following labs and imaging studies  CBC: Recent Labs  Lab 12/27/18 1652 12/30/18 0628  WBC 9.4 12.6*  HGB 12.6 12.5  HCT 38.8 37.7  MCV 83.8 84.2  PLT 207 852   Basic Metabolic Panel: Recent Labs  Lab 12/27/18 1652 12/30/18 0628  NA 130* 134*  K 3.7 3.5  CL 92* 96*  CO2 27 26  GLUCOSE 366* 210*  BUN 17 17  CREATININE 0.88 0.78  CALCIUM 8.4* 8.6*  MG 1.5*  --    GFR: Estimated Creatinine Clearance: 40.7 mL/min (by C-G formula based on SCr of 0.78 mg/dL). Liver Function Tests: No results for input(s): AST, ALT, ALKPHOS, BILITOT, PROT, ALBUMIN in the last 168 hours. No results for input(s): LIPASE, AMYLASE in the last 168 hours. No results for input(s): AMMONIA in the last 168 hours. Coagulation Profile: No results for input(s): INR, PROTIME in the last 168 hours. Cardiac Enzymes: No  results for input(s): CKTOTAL, CKMB, CKMBINDEX, TROPONINI in the last 168 hours. BNP (last 3 results) No results for input(s): PROBNP in the last 8760 hours. HbA1C: Recent Labs    12/27/18 1652  HGBA1C 9.0*   CBG: Recent Labs  Lab 12/29/18 1202 12/29/18 1655 12/29/18 2126 12/30/18 0633 12/30/18 1212  GLUCAP 273* 274* 243* 206* 180*   Lipid Profile: Recent Labs    12/30/18 0943  CHOL 198  HDL 51  LDLCALC 119*  TRIG 139  CHOLHDL 3.9   Thyroid Function Tests: No results for input(s): TSH, T4TOTAL, FREET4, T3FREE, THYROIDAB in the last 72 hours. Anemia Panel: No results for input(s): VITAMINB12, FOLATE, FERRITIN, TIBC, IRON, RETICCTPCT in the last 72 hours. Sepsis Labs: No results for input(s): PROCALCITON, LATICACIDVEN in the last 168 hours.  Recent Results (from the past 240 hour(s))  SARS Coronavirus 2 (CEPHEID- Performed in [place] hospital lab), Hosp Order     Status: None   Collection Time: 12/28/18 12:05 AM   Specimen: Nasopharyngeal Swab  Result Value Ref Range Status   SARS Coronavirus 2 NEGATIVE NEGATIVE Final    Comment: (NOTE)  If result is NEGATIVE SARS-CoV-2 target nucleic acids are NOT DETECTED. The SARS-CoV-2 RNA is generally detectable in upper and lower  respiratory specimens during the acute phase of infection. The lowest  concentration of SARS-CoV-2 viral copies this assay can detect is 250  copies / mL. A negative result does not preclude SARS-CoV-2 infection  and should not be used as the sole basis for treatment or other  patient management decisions.  A negative result may occur with  improper specimen collection / handling, submission of specimen other  than nasopharyngeal swab, presence of viral mutation(s) within the  areas targeted by this assay, and inadequate number of viral copies  (<250 copies / mL). A negative result must be combined with clinical  observations, patient history, and epidemiological information. If result is  POSITIVE SARS-CoV-2 target nucleic acids are DETECTED. The SARS-CoV-2 RNA is generally detectable in upper and lower  respiratory specimens dur ing the acute phase of infection.  Positive  results are indicative of active infection with SARS-CoV-2.  Clinical  correlation with patient history and other diagnostic information is  necessary to determine patient infection status.  Positive results do  not rule out bacterial infection or co-infection with other viruses. If result is PRESUMPTIVE POSTIVE SARS-CoV-2 nucleic acids MAY BE PRESENT.   A presumptive positive result was obtained on the submitted specimen  and confirmed on repeat testing.  While 2019 novel coronavirus  (SARS-CoV-2) nucleic acids may be present in the submitted sample  additional confirmatory testing may be necessary for epidemiological  and / or clinical management purposes  to differentiate between  SARS-CoV-2 and other Sarbecovirus currently known to infect humans.  If clinically indicated additional testing with an alternate test  methodology [phone]) is advised. The SARS-CoV-2 RNA is generally  detectable in upper and lower respiratory sp ecimens during the acute  phase of infection. The expected result is Negative. Fact Sheet for Patients:  StrictlyIdeas.no Fact Sheet for Healthcare Providers: BankingDealers.co.za This test is not yet approved or cleared by the [place] FDA and has been authorized for detection and/or diagnosis of SARS-CoV-2 by FDA under an Emergency Use Authorization (EUA).  This EUA will remain in effect (meaning this test can be used) for the duration of the COVID-19 declaration under Section 564(b)(1) of the Act, 21 U.S.C. section 360bbb-3(b)(1), unless the authorization is terminated or revoked sooner. Performed at [facility], [place] [address] [place], [place] [address]      Radiology Studies: No results found.  Scheduled  Meds: . amLODipine  2.5 mg Oral Daily  . amoxicillin-clavulanate  1 tablet Oral BID  . aspirin EC  81 mg Oral Daily  . fluticasone  1 spray Each Nare Daily  . imipramine  75 mg Oral QHS  . insulin lispro  0-15 Units Subcutaneous TID WC  . insulin lispro  0-5 Units Subcutaneous QHS  . levothyroxine  100 mcg Oral QAC breakfast  . loratadine  10 mg Oral Daily  . montelukast  10 mg Oral QHS  . pantoprazole  40 mg Oral Daily  . polyethylene glycol  17 g Oral Daily  . [START ON 12/31/2018] predniSONE  10 mg Oral Q breakfast  . sodium chloride flush  3 mL Intravenous Q12H  . triazolam  0.25 mg Oral QHS   Continuous Infusions: . sodium chloride    . sodium chloride 1 mL/kg/hr (12/29/18 2251)  . heparin 650 Units/hr (12/30/18 0400)     LOS: 1 day   Marylu Lund,  MD [facility] Pager On Amion  If 7PM-7AM, please contact night-coverage 12/30/2018, 2:55 PM

## 2018-12-30 NOTE — TOC Initial Note (Signed)
Transition of Care [facility]) - Initial/Assessment Note    Patient Details  Name: [name] MRN: [id number] Date of Birth: Jan 31, 1931  Transition of Care [facility]) CM/SW Contact:    Candie Chroman, LCSW Phone Number: 12/30/2018, 1:34 PM  Clinical Narrative: Readmission prevention screen complete. CSW met with patient, introduced role, and explained that discharge planning would be discussed. PCP is Dr. Ashby Dawes. Pharmacy is [facility]. She is not active with a home health agency but receives services through [facility]. She stated they follow up anytime she needs them but at least once per month. She does not use any DME at home. Patient reports that she dances, yoga, and is active at the [facility] when it's open. Daughter, neighbor, and [employer] provide transportation when needed. No concerns about affording medications. No further concerns. CSW encouraged patient to contact CSW as needed. CSW will continue to follow for patient needs prior to return home.           Expected Discharge Plan: Home/Self Care Barriers to Discharge: Continued Medical Work up   Patient Goals and CMS Choice        Expected Discharge Plan and Services Expected Discharge Plan: Home/Self Care     Post Acute Care Choice: NA Living arrangements for the past 2 months: Single Family Home                                      Prior Living Arrangements/Services Living arrangements for the past 2 months: Single Family Home Lives with:: Self Patient language and need for interpreter reviewed:: Yes(No needs.) Do you feel safe going back to the place where you live?: Yes      Need for Family Participation in Patient Care: Yes (Comment) Care giver support system in place?: Yes (comment) Current home services: Other (comment)([facility], Mobile Meals) Criminal Activity/Legal Involvement Pertinent to Current Situation/Hospitalization: No - Comment as needed  Activities of Daily Living Home Assistive  Devices/Equipment: None ADL Screening (condition at time of admission) Patient's cognitive ability adequate to safely complete daily activities?: Yes Is the patient deaf or have difficulty hearing?: Yes Does the patient have difficulty seeing, even when wearing glasses/contacts?: Yes Does the patient have difficulty concentrating, remembering, or making decisions?: No Patient able to express need for assistance with ADLs?: Yes Does the patient have difficulty dressing or bathing?: No Independently performs ADLs?: Yes (appropriate for developmental age) Does the patient have difficulty walking or climbing stairs?: Yes Weakness of Legs: Both Weakness of Arms/Hands: None  Permission Sought/Granted                  Emotional Assessment Appearance:: Appears stated age Attitude/Demeanor/Rapport: Engaged, Gracious Affect (typically observed): Accepting, Appropriate, Calm, Pleasant Orientation: : Oriented to Self, Oriented to Place, Oriented to  Time, Oriented to Situation Alcohol / Substance Use: Never Used Psych Involvement: No (comment)  Admission diagnosis:  Chest discomfort Patient Active Problem List   Diagnosis Date Noted  . Chest pain 12/29/2018  . Angina pectoris ([place]) 12/28/2018  . CAD (coronary artery disease) 12/28/2018  . Chronic sinusitis 12/28/2018  . Fever and chills 12/24/2018  . Allergy to multiple antibiotics 12/24/2018  . GERD (gastroesophageal reflux disease) 09/11/2018  . Dyspnea 09/11/2018  . Grade II diastolic dysfunction 63/87/5643  . Thrombocytopenia ([place]) 07/30/2018  . [place] 07/17/2018  . Recurrent sinus infections 07/05/2018  . Jaw pain 06/17/2018  . Multifocal pneumonia 06/17/2018  .  Abnormal ECG   . SOB (shortness of breath), secondary to [place] 07/16/2017  . Chest pain, moderate coronary artery risk 07/13/2017  . Hypoxia 07/09/2017  . Dehydration   . Enteritis 11/17/2016  . Nondisplaced fracture of second metatarsal bone, left foot, subsequent  encounter for fracture with routine healing 06/29/2016  . Arthralgia of both lower legs 03/30/2016  . Hypothyroidism 10/01/2015  . Sepsis ([place]) 05/16/2015  . HCAP (healthcare-associated pneumonia) 05/16/2015  . Diarrhea 05/02/2015  . Diarrhea with dehydration 05/02/2015  . Hyponatremia 05/02/2015  . Weakness 01/29/2015  . Pain in joint, ankle and foot 11/20/2014  . Acute bronchitis 09/01/2014  . CAP (community acquired pneumonia) 05/26/2014  . Dysphagia, unspecified(787.20) 06/18/2013  . Stricture and stenosis of esophagus 06/18/2013  . Urinary incontinence in female 05/06/2012  . Fibromyalgia   . Chronic confusional state 03/21/2012  . Postoperative anemia 03/21/2012  . Anemia of chronic disease 03/19/2012  . Type 2 diabetes mellitus ([place]) 05/30/2011  . COPD (chronic obstructive pulmonary disease) ([place]) 05/30/2011  . Scleroderma ([place]) 05/30/2011  . Colonic stricture s/p lap sigmoid colectomy 03/18/2012 03/27/2011  . Diverticulosis of colon 03/27/2011  . Systemic sclerosis ([place]) 06/29/2009  . Pure hypercholesterolemia 05/25/2009  . BOOP (bronchiolitis obliterans with organizing pneumonia) ([place]) 02/18/2009  . Chronic rhinitis 07/08/2007  . EMPHYSEMA 07/08/2007  . Hyperlipidemia 06/26/2007  . Depression 06/26/2007  . Legal blindness 06/26/2007  . HTN (hypertension) 06/26/2007  . Coronary atherosclerosis 06/26/2007  . ZENKER'S DIVERTICULUM 06/26/2007  . Chronic constipation 06/26/2007  . Irritable bowel syndrome 06/26/2007   PCP:  Merrilee Seashore, MD Pharmacy:   [facility], [place] [address] Phone: [phone] Fax: [phone]     Social Determinants of Health (SDOH) Interventions    Readmission Risk Interventions Readmission Risk Prevention Plan 12/30/2018  Transportation Screening Complete  HRI or [place] Complete  Social Work Consult for [place]  Planning/Counseling Complete  Palliative Care Screening Not Applicable  Medication Review Press photographer) Complete  Some recent data might be hidden

## 2018-12-30 NOTE — Progress Notes (Signed)
ANTICOAGULATION CONSULT NOTE   Pharmacy Consult for Heparin Indication: chest pain/ACS  Patient Measurements: Height: 5\' 2"  (157.5 cm) Weight: 125 lb 8 oz (56.9 kg) IBW/kg (Calculated) : 50.1 Heparin Dosing Weight: 56.9  Vital Signs: Temp: 97.7 F (36.5 C) (07/06 0101) Temp Source: Oral (07/06 0101) BP: 114/51 (07/06 0111) Pulse Rate: 83 (07/06 0111)  Labs: Recent Labs    12/27/18 1652  12/27/18 2329 12/28/18 0437 12/28/18 1052 12/30/18 0009  HGB 12.6  --   --   --   --   --   HCT 38.8  --   --   --   --   --   PLT 207  --   --   --   --   --   HEPARINUNFRC  --   --   --   --   --  0.76*  CREATININE 0.88  --   --   --   --   --   TROPONINIHS 44*   < > 42* 35* 43*  --    < > = values in this interval not displayed.    Estimated Creatinine Clearance: 37 mL/min (by C-G formula based on SCr of 0.88 mg/dL).   Medical History: Past Medical History:  Diagnosis Date  . Adenomatous colon polyp   . Arthritis   . Blood transfusion   . Cancer [facility])    cervical cancer pre hysterectomy  . Cervical disc disease   . Chronic sinusitis   . CONSTIPATION, CHRONIC 06/26/2007   Qualifier: Diagnosis of  By: Nils Pyle CMA ([place]), Mearl Latin    . COPD (chronic obstructive pulmonary disease) ([place])   . Coronary artery disease    prior MI with stenting of RCA with repeat PTCA/stenting for ISR in 01/2008, stent to LM 11/2008  . CVA (cerebral infarction) 1991  . Depression   . Diabetes mellitus   . Diverticulosis   . 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 Dyslipidemia   . Emphysema   . Fibromyalgia   . GERD (gastroesophageal reflux disease)   . Hypertension   . Hypothyroidism   . IBS (irritable bowel syndrome)   . Legally blind   . Macular degeneration   . Myocardial infarction [facility]) may 27th 2007  . Pneumonia 07/16/2013    HX PNEUMONIA  . Pulmonary hypertension ([place])   . Raynaud's phenomenon   . Scleroderma ([place])   . Sjogren's disease ([place])   . Sleep apnea    STOP BANG SCORE 5  . Stroke  [facility]) 1990   brain stem stroke - weakness rt hand  . Zenker's diverticulum     Assessment: CC/HPI: CP, burning chest pain radiating to her jaws associated with nausea and dyspnea.  PMH: see list  Anticoag: USAP with h/o CAD. Start IV heparin. Baseline CBC WNL  7/6 AM update:  Heparin level supra-therapeutic No issues per RN  Goal of Therapy:  Heparin level 0.3-0.7 units/ml Monitor platelets by anticoagulation protocol: Yes   Plan:  Dec heparin to 650 units/hr Re-check heparin level in 8 hours  Narda Bonds, PharmD, [place] Pharmacist Phone: [phone]

## 2018-12-30 NOTE — Progress Notes (Signed)
Pt with an episode of 8/10 burning mid chest pain

## 2018-12-30 NOTE — Progress Notes (Signed)
ANTICOAGULATION CONSULT NOTE - Follow Up Consult  Pharmacy Consult for Heparin Indication: chest pain/ACS  Allergies  Allergen Reactions  . Flomax [Tamsulosin Hcl] Other (See Comments)    Made her 'trachea feel tight and couldn't swallow'  . Peanut Oil Anaphylaxis, Swelling, Rash and Other (See Comments)    Throat tightens, also  . Sulfa Antibiotics Anaphylaxis       . Titanium Other (See Comments)    Neck wouldn't heal with titanium cervical plate  . Yellow Dye Other (See Comments)    "Bee stings" all over body when eating/taking medications with red or yellow dye   . Lantus [Insulin Glargine] Other (See Comments)    Patient states "sulfate binder causes sinus infection/pneumonia".  [place] [Levofloxacin In D5w] Other (See Comments)    Muscle aches  . Metoprolol-Hydrochlorothiazide Other (See Comments)    Decreased B/P  . Norvasc [Amlodipine] Swelling and Other (See Comments)    BIL Ankle edema  . Novolog [Insulin Aspart]     Patient states "sulfate binder causes sinus infection/pneumonia". Tolerates Humalog.  [name] Penciclovir Other (See Comments)    Muscle Pain  . Senna [Sennosides] Other (See Comments)    Pt states that it causes a "stinging" sensation  . Statins Other (See Comments)    All of them make me "feel badly"  . Sulfonamide Derivatives Hives  . Adhesive [Tape] Hives and Rash  . Maxidex [Dexamethasone] Anxiety and Other (See Comments)    Insomnia and dizziness, too    Patient Measurements: Height: 5\' 2"  (157.5 cm) Weight: 131 lb 11.2 oz (59.7 kg)(scale a) IBW/kg (Calculated) : 50.1 Heparin Dosing Weight: 59.7 kg  Vital Signs: Temp: 98 F (36.7 C) (07/06 0737) Temp Source: Oral (07/06 0737) BP: 117/40 (07/06 0737) Pulse Rate: 73 (07/06 0737)  Labs: Recent Labs    12/27/18 1652  12/27/18 2329 12/28/18 0437 12/28/18 1052 12/30/18 0009 12/30/18 0628 12/30/18 0931  HGB 12.6  --   --   --   --   --  12.5  --   HCT 38.8  --   --   --   --   --   37.7  --   PLT 207  --   --   --   --   --  247  --   HEPARINUNFRC  --   --   --   --   --  0.76*  --  0.48  CREATININE 0.88  --   --   --   --   --  0.78  --   TROPONINIHS 44*   < > 42* 35* 43*  --   --   --    < > = values in this interval not displayed.    Estimated Creatinine Clearance: 40.7 mL/min (by C-G formula based on SCr of 0.78 mg/dL).  Assessment:  Anticoag: USAP with h/o CAD. Start IV heparin. Baseline CBC WNL. HL 0.76>0.48. CBC WNL and stable.  Goal of Therapy:  Heparin level 0.3-0.7 units/ml Monitor platelets by anticoagulation protocol: Yes   Plan:  Cath today Continue heparin at 650 units/hr Daily HL and CBC   Karrington Mccravy S. Alford Highland, PharmD, [place] Clinical Staff Pharmacist  Eilene Ghazi Stillinger 12/30/2018,12:10 PM

## 2018-12-30 NOTE — Interval H&P Note (Signed)
History and Physical Interval Note:  12/30/2018 3:11 PM  [name]  has presented today for surgery, with the diagnosis of unstable angina.  The various methods of treatment have been discussed with the patient and family. After consideration of risks, benefits and other options for treatment, the patient has consented to  Procedure(s): LEFT HEART CATH AND CORONARY ANGIOGRAPHY (N/A) and possible coronary angioplasty as a surgical intervention.  The patient's history has been reviewed, patient examined, no change in status, stable for surgery.  I have reviewed the patient's chart and labs.  Questions were answered to the patient's satisfaction.     Daniel Bensimhon

## 2018-12-30 NOTE — Progress Notes (Signed)
DAILY PROGRESS NOTE   Patient Name: [name] Date of Encounter: 12/30/2018 Cardiologist: Dorris Carnes, MD  Chief Complaint   Anxious  Patient Profile   85 y.o. female with past medical history of coronary artery disease, scleroderma, Sjogren's, COPD, diabetes, hypertension, hyperlipidemia for evaluation of chest pain  Subjective   Anxious about cath - no chest pain overnight. Flat troponins. Has noted to be statin intolerant. Metoprolol added for hypertension.  Objective   Vitals:   12/30/18 0111 12/30/18 0425 12/30/18 0432 12/30/18 0737  BP: (!) 114/51  (!) 155/61 (!) 117/40  Pulse: 83  78 73  Resp:   19 18  Temp:   97.8 F (36.6 C) 98 F (36.7 C)  TempSrc:   Oral Oral  SpO2: 96%  98% 96%  Weight:  59.7 kg    Height:        Intake/Output Summary (Last 24 hours) at 12/30/2018 3295 Last data filed at 12/30/2018 0400 Gross per 24 hour  Intake 1202.93 ml  Output 950 ml  Net 252.93 ml   Filed Weights   12/28/18 0310 12/29/18 0611 12/30/18 0425  Weight: 61.5 kg 56.9 kg 59.7 kg    Physical Exam   General appearance: alert and anxious Neck: no carotid bruit, no JVD and thyroid not enlarged, symmetric, no tenderness/mass/nodules Lungs: clear to auscultation bilaterally Heart: regular rate and rhythm Abdomen: soft, non-tender; bowel sounds normal; no masses,  no organomegaly Extremities: extremities normal, atraumatic, no cyanosis or edema Pulses: 2+ and symmetric Skin: Skin color, texture, turgor normal. No rashes or lesions Neurologic: Grossly normal Psych: Pleasant  Inpatient Medications    Scheduled Meds: . amLODipine  2.5 mg Oral Daily  . amoxicillin-clavulanate  1 tablet Oral BID  . aspirin EC  81 mg Oral Daily  . fluticasone  1 spray Each Nare Daily  . imipramine  75 mg Oral QHS  . insulin lispro  0-15 Units Subcutaneous TID WC  . insulin lispro  0-5 Units Subcutaneous QHS  . levothyroxine  100 mcg Oral QAC breakfast  . loratadine  10 mg Oral  Daily  . montelukast  10 mg Oral QHS  . pantoprazole  40 mg Oral Daily  . polyethylene glycol  17 g Oral Daily  . [START ON 12/31/2018] predniSONE  10 mg Oral Q breakfast  . sodium chloride flush  3 mL Intravenous Q12H  . triazolam  0.25 mg Oral QHS    Continuous Infusions: . sodium chloride    . sodium chloride 1 mL/kg/hr (12/29/18 2251)  . heparin 650 Units/hr (12/30/18 0400)    PRN Meds: sodium chloride, acetaminophen, hydrOXYzine, lidocaine-prilocaine, nitroGLYCERIN, ondansetron (ZOFRAN) IV, sodium chloride flush, sorbitol   Labs   Results for orders placed or performed during the hospital encounter of 12/27/18 (from the past 48 hour(s))  Troponin I (High Sensitivity)     Status: Abnormal   Collection Time: 12/28/18 10:52 AM  Result Value Ref Range   Troponin I (High Sensitivity) 43 (H) <18 ng/L    Comment: (NOTE) Elevated high sensitivity troponin I (hsTnI) values and significant  changes across serial measurements may suggest ACS but many other  chronic and acute conditions are known to elevate hsTnI results.  Refer to the "Links" section for chest pain algorithms and additional  guidance. Performed at [facility], [place] [address] [place], [place] [address]   Glucose, capillary     Status: Abnormal   Collection Time: 12/28/18 11:39 AM  Result Value Ref Range  Glucose-Capillary 193 (H) 70 - 99 mg/dL  Glucose, capillary     Status: Abnormal   Collection Time: 12/28/18  4:33 PM  Result Value Ref Range   Glucose-Capillary 319 (H) 70 - 99 mg/dL  Urinalysis, Routine w reflex microscopic     Status: Abnormal   Collection Time: 12/28/18  5:01 PM  Result Value Ref Range   Color, Urine STRAW (A) YELLOW   APPearance CLEAR CLEAR   Specific Gravity, Urine 1.009 1.005 - 1.030   pH 8.0 5.0 - 8.0   Glucose, UA >=500 (A) NEGATIVE mg/dL   Hgb urine dipstick SMALL (A) NEGATIVE   Bilirubin Urine NEGATIVE NEGATIVE   Ketones, ur NEGATIVE NEGATIVE mg/dL   Protein, ur 100 (A)  NEGATIVE mg/dL   Nitrite NEGATIVE NEGATIVE   Leukocytes,Ua MODERATE (A) NEGATIVE   RBC / HPF 0-5 0 - 5 RBC/hpf   WBC, UA 11-20 0 - 5 WBC/hpf   Bacteria, UA NONE SEEN NONE SEEN   Squamous Epithelial / LPF 0-5 0 - 5    Comment: Performed at [facility], [address], [place], [place] [address]  Glucose, capillary     Status: Abnormal   Collection Time: 12/28/18  9:21 PM  Result Value Ref Range   Glucose-Capillary 248 (H) 70 - 99 mg/dL  Glucose, capillary     Status: Abnormal   Collection Time: 12/29/18  6:12 AM  Result Value Ref Range   Glucose-Capillary 165 (H) 70 - 99 mg/dL  Glucose, capillary     Status: Abnormal   Collection Time: 12/29/18 12:02 PM  Result Value Ref Range   Glucose-Capillary 273 (H) 70 - 99 mg/dL  Glucose, capillary     Status: Abnormal   Collection Time: 12/29/18  4:55 PM  Result Value Ref Range   Glucose-Capillary 274 (H) 70 - 99 mg/dL  Glucose, capillary     Status: Abnormal   Collection Time: 12/29/18  9:26 PM  Result Value Ref Range   Glucose-Capillary 243 (H) 70 - 99 mg/dL   Comment 1 Notify RN    Comment 2 Document in Chart   Heparin level (unfractionated)     Status: Abnormal   Collection Time: 12/30/18 12:09 AM  Result Value Ref Range   Heparin Unfractionated 0.76 (H) 0.30 - 0.70 IU/mL    Comment: (NOTE) If heparin results are below expected values, and patient dosage has  been confirmed, suggest follow up testing of antithrombin III levels. Performed at [facility], [place] [address], [place], [place] [address]   CBC     Status: Abnormal   Collection Time: 12/30/18  6:28 AM  Result Value Ref Range   WBC 12.6 (H) 4.0 - 10.5 K/uL   RBC 4.48 3.87 - 5.11 MIL/uL   Hemoglobin 12.5 12.0 - 15.0 g/dL   HCT 37.7 36.0 - 46.0 %   MCV 84.2 80.0 - 100.0 fL   MCH 27.9 26.0 - 34.0 pg   MCHC 33.2 30.0 - 36.0 g/dL   RDW 14.0 11.5 - 15.5 %   Platelets 247 150 - 400 K/uL   nRBC 0.0 0.0 - 0.2 %    Comment: Performed at [facility],  [place] [address], [place], [place] [address]  Basic metabolic panel     Status: Abnormal   Collection Time: 12/30/18  6:28 AM  Result Value Ref Range   Sodium 134 (L) 135 - 145 mmol/L   Potassium 3.5 3.5 - 5.1 mmol/L   Chloride 96 (  L) 98 - 111 mmol/L   CO2 26 22 - 32 mmol/L   Glucose, Bld 210 (H) 70 - 99 mg/dL   BUN 17 8 - 23 mg/dL   Creatinine, Ser 0.78 0.44 - 1.00 mg/dL   Calcium 8.6 (L) 8.9 - 10.3 mg/dL   GFR calc non Af Amer >60 >60 mL/min   GFR calc Af Amer >60 >60 mL/min   Anion gap 12 5 - 15    Comment: Performed at [facility] [address], [place], [place] [address]  Glucose, capillary     Status: Abnormal   Collection Time: 12/30/18  6:33 AM  Result Value Ref Range   Glucose-Capillary 206 (H) 70 - 99 mg/dL   Comment 1 Notify RN    Comment 2 Document in Chart     ECG   NSR at 84, inferior and lateral T wave inversions - Personally Reviewed  Telemetry   Sinus rhythm - Personally Reviewed  Radiology    No results found.  Cardiac Studies   Procedure: 2D Echo, Cardiac Doppler and Color Doppler  Indications:    R07.9* Chest pain, unspecified   History:        Patient has prior history of Echocardiogram examinations, most                 recent 06/18/2018. CAD and Previous Myocardial Infarction COPD                 and Stroke Risk Factors: Hypertension, Diabetes, Dyslipidemia,                 Sleep Apnea and GERD.   Sonographer:    Jonelle Sidle Dance Referring Phys: [id number] [place]    1. The left ventricle has normal systolic function with an ejection fraction of 60-65%. The cavity size was normal. There is mild concentric left ventricular hypertrophy. Left ventricular diastolic Doppler parameters are consistent with  pseudonormalization. Elevated left atrial and left ventricular end-diastolic pressures.  2. The right ventricle has normal systolic function. The cavity was normal. There is no increase in right ventricular wall  thickness.  3. The mitral valve is degenerative. Moderate thickening of the mitral valve leaflet. Mild calcification of the mitral valve leaflet. There is mild mitral annular calcification present. Mitral valve regurgitation is moderate by color flow Doppler. No  evidence of mitral valve stenosis.  4. The aortic valve is tricuspid. Mild thickening of the aortic valve. Aortic valve regurgitation is mild by color flow Doppler. No stenosis of the aortic valve.  Assessment   1. Principal Problem: 2.   Angina pectoris ([place]) 3. Active Problems: 4.   HTN (hypertension) 5.   Chronic constipation 6.   Type 2 diabetes mellitus ([place]) 7.   CAD (coronary artery disease) 8.   Chronic sinusitis 9.   Chest pain 10.   Plan   1. Plan for LHC today - she is anxious and may require more sedation at cath. I have added a lipid profile today since she is fasting - has been statin intolerant and may be a PCSK9i candidate depending on cath findings. BP improved overnight. New leukocytosis noted - 12.6 today - FBG 210. Defer to hospitalist - may be stress-related.  Time Spent Directly with Patient:  I have spent a total of 25 minutes with the patient reviewing hospital notes, telemetry, EKGs, labs and examining the patient as well as establishing an assessment and plan that was discussed personally with the patient.  >  50% of time was spent in direct patient care.  Length of Stay:  LOS: 1 day   Pixie Casino, MD, [facility], [place] Director of the Advanced Lipid Disorders &  Cardiovascular Risk Reduction Clinic Diplomate of the American Board of Clinical Lipidology Attending Cardiologist  Direct Dial: [phone]  Fax: [phone]  Website:  [email or web address] 12/30/2018, 9:37 AM

## 2018-12-30 NOTE — Progress Notes (Signed)
Pt with 2nd episode of 8/10 burning mid chest pain while IV team was in pt's room attempting to get 2nd pre-cath IV. This RN was notified. Pt also c/o nausea. Chest pain subsided before PRN Nitro brought into pt's room. Zofran IV given. Will continue to monitor.

## 2018-12-30 NOTE — Plan of Care (Signed)
  Problem: Education: Goal: Knowledge of General Education information will improve Description Including pain rating scale, medication(s)/side effects and non-pharmacologic comfort measures Outcome: Progressing   

## 2018-12-30 NOTE — H&P (View-Only) (Signed)
DAILY PROGRESS NOTE   Patient Name: [name] Date of Encounter: 12/30/2018 Cardiologist: Dorris Carnes, MD  Chief Complaint   Anxious  Patient Profile   85 y.o. female with past medical history of coronary artery disease, scleroderma, Sjogren's, COPD, diabetes, hypertension, hyperlipidemia for evaluation of chest pain  Subjective   Anxious about cath - no chest pain overnight. Flat troponins. Has noted to be statin intolerant. Metoprolol added for hypertension.  Objective   Vitals:   12/30/18 0111 12/30/18 0425 12/30/18 0432 12/30/18 0737  BP: (!) 114/51  (!) 155/61 (!) 117/40  Pulse: 83  78 73  Resp:   19 18  Temp:   97.8 F (36.6 C) 98 F (36.7 C)  TempSrc:   Oral Oral  SpO2: 96%  98% 96%  Weight:  59.7 kg    Height:        Intake/Output Summary (Last 24 hours) at 12/30/2018 1610 Last data filed at 12/30/2018 0400 Gross per 24 hour  Intake 1202.93 ml  Output 950 ml  Net 252.93 ml   Filed Weights   12/28/18 0310 12/29/18 0611 12/30/18 0425  Weight: 61.5 kg 56.9 kg 59.7 kg    Physical Exam   General appearance: alert and anxious Neck: no carotid bruit, no JVD and thyroid not enlarged, symmetric, no tenderness/mass/nodules Lungs: clear to auscultation bilaterally Heart: regular rate and rhythm Abdomen: soft, non-tender; bowel sounds normal; no masses,  no organomegaly Extremities: extremities normal, atraumatic, no cyanosis or edema Pulses: 2+ and symmetric Skin: Skin color, texture, turgor normal. No rashes or lesions Neurologic: Grossly normal Psych: Pleasant  Inpatient Medications    Scheduled Meds: . amLODipine  2.5 mg Oral Daily  . amoxicillin-clavulanate  1 tablet Oral BID  . aspirin EC  81 mg Oral Daily  . fluticasone  1 spray Each Nare Daily  . imipramine  75 mg Oral QHS  . insulin lispro  0-15 Units Subcutaneous TID WC  . insulin lispro  0-5 Units Subcutaneous QHS  . levothyroxine  100 mcg Oral QAC breakfast  . loratadine  10 mg Oral  Daily  . montelukast  10 mg Oral QHS  . pantoprazole  40 mg Oral Daily  . polyethylene glycol  17 g Oral Daily  . [START ON 12/31/2018] predniSONE  10 mg Oral Q breakfast  . sodium chloride flush  3 mL Intravenous Q12H  . triazolam  0.25 mg Oral QHS    Continuous Infusions: . sodium chloride    . sodium chloride 1 mL/kg/hr (12/29/18 2251)  . heparin 650 Units/hr (12/30/18 0400)    PRN Meds: sodium chloride, acetaminophen, hydrOXYzine, lidocaine-prilocaine, nitroGLYCERIN, ondansetron (ZOFRAN) IV, sodium chloride flush, sorbitol   Labs   Results for orders placed or performed during the hospital encounter of 12/27/18 (from the past 48 hour(s))  Troponin I (High Sensitivity)     Status: Abnormal   Collection Time: 12/28/18 10:52 AM  Result Value Ref Range   Troponin I (High Sensitivity) 43 (H) <18 ng/L    Comment: (NOTE) Elevated high sensitivity troponin I (hsTnI) values and significant  changes across serial measurements may suggest ACS but many other  chronic and acute conditions are known to elevate hsTnI results.  Refer to the "Links" section for chest pain algorithms and additional  guidance. Performed at [facility], [place] [address], [place], [place] [address]   Glucose, capillary     Status: Abnormal   Collection Time: 12/28/18 11:39 AM  Result Value Ref Range  Glucose-Capillary 193 (H) 70 - 99 mg/dL  Glucose, capillary     Status: Abnormal   Collection Time: 12/28/18  4:33 PM  Result Value Ref Range   Glucose-Capillary 319 (H) 70 - 99 mg/dL  Urinalysis, Routine w reflex microscopic     Status: Abnormal   Collection Time: 12/28/18  5:01 PM  Result Value Ref Range   Color, Urine STRAW (A) YELLOW   APPearance CLEAR CLEAR   Specific Gravity, Urine 1.009 1.005 - 1.030   pH 8.0 5.0 - 8.0   Glucose, UA >=500 (A) NEGATIVE mg/dL   Hgb urine dipstick SMALL (A) NEGATIVE   Bilirubin Urine NEGATIVE NEGATIVE   Ketones, ur NEGATIVE NEGATIVE mg/dL   Protein, ur 100 (A)  NEGATIVE mg/dL   Nitrite NEGATIVE NEGATIVE   Leukocytes,Ua MODERATE (A) NEGATIVE   RBC / HPF 0-5 0 - 5 RBC/hpf   WBC, UA 11-20 0 - 5 WBC/hpf   Bacteria, UA NONE SEEN NONE SEEN   Squamous Epithelial / LPF 0-5 0 - 5    Comment: Performed at [facility], [address] [place], [place] [address]  Glucose, capillary     Status: Abnormal   Collection Time: 12/28/18  9:21 PM  Result Value Ref Range   Glucose-Capillary 248 (H) 70 - 99 mg/dL  Glucose, capillary     Status: Abnormal   Collection Time: 12/29/18  6:12 AM  Result Value Ref Range   Glucose-Capillary 165 (H) 70 - 99 mg/dL  Glucose, capillary     Status: Abnormal   Collection Time: 12/29/18 12:02 PM  Result Value Ref Range   Glucose-Capillary 273 (H) 70 - 99 mg/dL  Glucose, capillary     Status: Abnormal   Collection Time: 12/29/18  4:55 PM  Result Value Ref Range   Glucose-Capillary 274 (H) 70 - 99 mg/dL  Glucose, capillary     Status: Abnormal   Collection Time: 12/29/18  9:26 PM  Result Value Ref Range   Glucose-Capillary 243 (H) 70 - 99 mg/dL   Comment 1 Notify RN    Comment 2 Document in Chart   Heparin level (unfractionated)     Status: Abnormal   Collection Time: 12/30/18 12:09 AM  Result Value Ref Range   Heparin Unfractionated 0.76 (H) 0.30 - 0.70 IU/mL    Comment: (NOTE) If heparin results are below expected values, and patient dosage has  been confirmed, suggest follow up testing of antithrombin III levels. Performed at [facility], [place] [address], [place], [place] [address]   CBC     Status: Abnormal   Collection Time: 12/30/18  6:28 AM  Result Value Ref Range   WBC 12.6 (H) 4.0 - 10.5 K/uL   RBC 4.48 3.87 - 5.11 MIL/uL   Hemoglobin 12.5 12.0 - 15.0 g/dL   HCT 37.7 36.0 - 46.0 %   MCV 84.2 80.0 - 100.0 fL   MCH 27.9 26.0 - 34.0 pg   MCHC 33.2 30.0 - 36.0 g/dL   RDW 14.0 11.5 - 15.5 %   Platelets 247 150 - 400 K/uL   nRBC 0.0 0.0 - 0.2 %    Comment: Performed at [facility],  [place] [address], [place], [place] [address]  Basic metabolic panel     Status: Abnormal   Collection Time: 12/30/18  6:28 AM  Result Value Ref Range   Sodium 134 (L) 135 - 145 mmol/L   Potassium 3.5 3.5 - 5.1 mmol/L   Chloride 96 (  L) 98 - 111 mmol/L   CO2 26 22 - 32 mmol/L   Glucose, Bld 210 (H) 70 - 99 mg/dL   BUN 17 8 - 23 mg/dL   Creatinine, Ser 0.78 0.44 - 1.00 mg/dL   Calcium 8.6 (L) 8.9 - 10.3 mg/dL   GFR calc non Af Amer >60 >60 mL/min   GFR calc Af Amer >60 >60 mL/min   Anion gap 12 5 - 15    Comment: Performed at [facility] [address], [place], [place] [address]  Glucose, capillary     Status: Abnormal   Collection Time: 12/30/18  6:33 AM  Result Value Ref Range   Glucose-Capillary 206 (H) 70 - 99 mg/dL   Comment 1 Notify RN    Comment 2 Document in Chart     ECG   NSR at 84, inferior and lateral T wave inversions - Personally Reviewed  Telemetry   Sinus rhythm - Personally Reviewed  Radiology    No results found.  Cardiac Studies   Procedure: 2D Echo, Cardiac Doppler and Color Doppler  Indications:    R07.9* Chest pain, unspecified   History:        Patient has prior history of Echocardiogram examinations, most                 recent 06/18/2018. CAD and Previous Myocardial Infarction COPD                 and Stroke Risk Factors: Hypertension, Diabetes, Dyslipidemia,                 Sleep Apnea and GERD.   Sonographer:    Jonelle Sidle Dance Referring Phys: [id number] [place]    1. The left ventricle has normal systolic function with an ejection fraction of 60-65%. The cavity size was normal. There is mild concentric left ventricular hypertrophy. Left ventricular diastolic Doppler parameters are consistent with  pseudonormalization. Elevated left atrial and left ventricular end-diastolic pressures.  2. The right ventricle has normal systolic function. The cavity was normal. There is no increase in right ventricular wall  thickness.  3. The mitral valve is degenerative. Moderate thickening of the mitral valve leaflet. Mild calcification of the mitral valve leaflet. There is mild mitral annular calcification present. Mitral valve regurgitation is moderate by color flow Doppler. No  evidence of mitral valve stenosis.  4. The aortic valve is tricuspid. Mild thickening of the aortic valve. Aortic valve regurgitation is mild by color flow Doppler. No stenosis of the aortic valve.  Assessment   1. Principal Problem: 2.   Angina pectoris ([place]) 3. Active Problems: 4.   HTN (hypertension) 5.   Chronic constipation 6.   Type 2 diabetes mellitus ([place]) 7.   CAD (coronary artery disease) 8.   Chronic sinusitis 9.   Chest pain 10.   Plan   1. Plan for LHC today - she is anxious and may require more sedation at cath. I have added a lipid profile today since she is fasting - has been statin intolerant and may be a PCSK9i candidate depending on cath findings. BP improved overnight. New leukocytosis noted - 12.6 today - FBG 210. Defer to hospitalist - may be stress-related.  Time Spent Directly with Patient:  I have spent a total of 25 minutes with the patient reviewing hospital notes, telemetry, EKGs, labs and examining the patient as well as establishing an assessment and plan that was discussed personally with the patient.  >  50% of time was spent in direct patient care.  Length of Stay:  LOS: 1 day   Pixie Casino, MD, [facility], [place] Director of the Advanced Lipid Disorders &  Cardiovascular Risk Reduction Clinic Diplomate of the American Board of Clinical Lipidology Attending Cardiologist  Direct Dial: [phone]  Fax: [phone]  Website:  [email or web address] 12/30/2018, 9:37 AM

## 2018-12-30 NOTE — Progress Notes (Addendum)
Inpatient Diabetes Program Recommendations  AACE/ADA: New Consensus Statement on Inpatient Glycemic Control (2015)  Target Ranges:  Prepandial:   less than 140 mg/dL      Peak postprandial:   less than 180 mg/dL (1-2 hours)      Critically ill patients:  140 - 180 mg/dL   Results for DEBBRA, DIGIULIO (MRN [id number]) as of 12/30/2018 09:35  Ref. Range 12/29/2018 06:12 12/29/2018 12:02 12/29/2018 16:55 12/29/2018 21:26  Glucose-Capillary Latest Ref Range: 70 - 99 mg/dL 165 (H)  3  units NOVOLOG  273 (H)  8 units NOVOLOG  274 (H)  8 units NOVOLOG  243 (H)   Results for YANNELY, KINTZEL (MRN [id number]) as of 12/30/2018 09:35  Ref. Range 12/30/2018 06:33  Glucose-Capillary Latest Ref Range: 70 - 99 mg/dL 206 (H)  5 units NOVOLOG    Results for JAONNA, WORD (MRN [id number]) as of 12/30/2018 09:35  Ref. Range 06/18/2018 03:05 07/30/2018 14:06 12/27/2018 16:52  Hemoglobin A1C Latest Ref Range: 4.8 - 5.6 % 7.2 (H)  (159 mg/dl) 8.0 (H)  (182 mg/dl) 9.0 (H)  (211 mg/dl)    Admit with: CP/ Angina  History: DM, COPD  Home DM Meds: Humalog 6 units with Breakfast/ 6 units with Lunch/ 5 units with Dinner  Current Orders: Humalog Moderate Correction Scale/ SSI (0-15 units) TID AC + HS     PCP: Merrilee Seashore, MD  ENDO: Dr. Chalmers Cater with [facility]--Last seen 10/21/2018  Allergy noted to Lantus and Novolog.  Getting Prednisone 10 mg Daily.    MD- Note that Fasting CBG the last 2 days has been elevated: 165/ 206 mg/dl.  May consider adding low dose basal insulin while patient in hospital.  Note allergy to Lantus so would recommend NPH Insulin instead  1. NPH Insulin 6 units QHS (0.1 units/kg dosing based on weight of 59 kg)  2. May also want to add low dose Meal Coverage to pt's inpatient regimen as well (note CBGs were elevated yesterday afternoon as well after meals):  Humalog 4 units TID with meals     Addendum 1:30pm- Met with pt today to discuss her  current A1c of 9%.  Discussed with pt my concern that her A1c has risen from 7.2% back in December to 8% back in February to 9% in July.  Pt told me she has been hospitalized twice for Pneumonia since December and has been taking a lot of steroids.  Also told me she has a "sweet tooth" and has been eating more sweets lately and not exercising due to [place].  Stated to me she plans to get back on track and has regular follow up visits with her PCP and ENDO who are in the same office building.  Takes insulin as prescribed and does not miss doses.  Has Freestyle Libre CGM.  I encouraged pt to scan her Libre CGM more frequently at home to get a better idea of how the food she is eating is affecting her CBGs.  Encouraged scanning pre-meals and 2 hours post-meals.  Reviewed CBG goals at home.  Was very appreciative of the info I shared with pt and she did not have any further questions.      --Will follow patient during hospitalization--  [name] RN, MSN, CDE Diabetes Coordinator Inpatient Glycemic Control Team Team Pager: [phone] (8a-5p)

## 2018-12-30 NOTE — Progress Notes (Signed)
Patient started having 6 out of 10 Burning pain in chest.  EKG and Vital signs performed.   1 nitro given.   Patient started to resolve as quickly as it came on.  Was resolved after 1 Nitro.

## 2018-12-31 ENCOUNTER — Encounter (HOSPITAL_COMMUNITY): Payer: Self-pay | Admitting: Internal Medicine

## 2018-12-31 DIAGNOSIS — I25110 Atherosclerotic heart disease of native coronary artery with unstable angina pectoris: Principal | ICD-10-CM

## 2018-12-31 LAB — CBC
HCT: 34.9 % — ABNORMAL LOW (ref 36.0–46.0)
Hemoglobin: 11.5 g/dL — ABNORMAL LOW (ref 12.0–15.0)
MCH: 27.7 pg (ref 26.0–34.0)
MCHC: 33 g/dL (ref 30.0–36.0)
MCV: 84.1 fL (ref 80.0–100.0)
Platelets: 228 10*3/uL (ref 150–400)
RBC: 4.15 MIL/uL (ref 3.87–5.11)
RDW: 13.7 % (ref 11.5–15.5)
WBC: 10.3 10*3/uL (ref 4.0–10.5)
nRBC: 0 % (ref 0.0–0.2)

## 2018-12-31 LAB — BASIC METABOLIC PANEL
Anion gap: 10 (ref 5–15)
BUN: 13 mg/dL (ref 8–23)
CO2: 24 mmol/L (ref 22–32)
Calcium: 8.3 mg/dL — ABNORMAL LOW (ref 8.9–10.3)
Chloride: 100 mmol/L (ref 98–111)
Creatinine, Ser: 0.77 mg/dL (ref 0.44–1.00)
GFR calc Af Amer: >60 mL/min (ref >60)
GFR calc non Af Amer: >60 mL/min (ref >60)
Glucose, Bld: 215 mg/dL — ABNORMAL HIGH (ref 70–99)
Potassium: 3.4 mmol/L — ABNORMAL LOW (ref 3.5–5.1)
Sodium: 134 mmol/L — ABNORMAL LOW (ref 135–145)

## 2018-12-31 LAB — GLUCOSE, CAPILLARY
Glucose-Capillary: 135 mg/dL — ABNORMAL HIGH (ref 70–99)
Glucose-Capillary: 232 mg/dL — ABNORMAL HIGH (ref 70–99)

## 2018-12-31 MED ORDER — TICAGRELOR 90 MG PO TABS: 90.0000 mg | ORAL_TABLET | Freq: Two times a day (BID) | ORAL | 0 refills | Status: DC

## 2018-12-31 MED ORDER — PREDNISONE 10 MG PO TABS
10.0000 mg | ORAL_TABLET | Freq: Every day | ORAL | Status: DC
  Administered 2018-12-31: 10 mg via ORAL
  Filled 2018-12-31: qty 1

## 2018-12-31 MED ORDER — EZETIMIBE 10 MG PO TABS: 10.0000 mg | ORAL_TABLET | Freq: Every day | ORAL | 0 refills | Status: DC

## 2018-12-31 MED ORDER — EZETIMIBE 10 MG PO TABS
10.0000 mg | ORAL_TABLET | Freq: Every day | ORAL | Status: DC
  Administered 2018-12-31: 10 mg via ORAL
  Filled 2018-12-31: qty 1

## 2018-12-31 MED FILL — EZETIMIBE 10 MG TABS: 10 | 30 days supply | Qty: 30 | Fill #0

## 2018-12-31 MED FILL — BRILINTA 90 MG TABLET: 90 | 30 days supply | Qty: 60 | Fill #0

## 2018-12-31 NOTE — Discharge Instructions (Signed)
Fingerstick glucose (sugar) goals for home: Before meals: 80-130 mg/dl 2-Hours after meals: less than 180 mg/dl Hemoglobin A1c goal: 7% or less  Diabetes Diet Info: Avoid beverages with sugar (regular soda, sweet tea, lemonade, fruit juice) and consume mostly water.   Be careful with portion sizes (especially grains, starchy vegetables, and fruits).   Women should have 45-60 grams of carbohydrates per meal per day. See American Diabetes Association Website for more info: www.diabetes.org    Angina  Angina is very bad discomfort or pain in the chest, neck, arm, jaw, or back. The discomfort is caused by a lack of blood in the middle layer of the heart wall (myocardium). What are the causes? This condition is caused by a buildup of fat and cholesterol (plaque) in your arteries (atherosclerosis). This buildup narrows the arteries and makes it hard for blood to flow. What increases the risk? You are more likely to develop this condition if:  You have high levels of cholesterol in your blood.  You have high blood pressure (hypertension).  You have diabetes.  You have a family history of heart disease.  You are not active, or you do not exercise enough.  You feel sad (depressed).  You have been treated with high energy rays (radiation) on the left side of your chest. Other risk factors are:  Using tobacco.  Being very overweight (obese).  Eating a diet high in unhealthy fats (saturated fats).  Having stress, or being exposed to things that cause stress.  Using drugs, such as cocaine. Women have a greater risk for angina if:  They are older than 71.  They have stopped having their period (are in postmenopause). What are the signs or symptoms? Common symptoms of this condition in both men and women may include:  Chest pain, which may: ? Feel like a crushing or squeezing in the chest. ? Feel like a tightness, pressure, fullness, or heaviness in the chest. ? Last for more  than a few minutes at a time. ? Stop and come back (recur) after a few minutes.  Pain in the neck, arm, jaw, or back.  Heartburn or upset stomach (indigestion) for no reason.  Being short of breath.  Feeling sick to your stomach (nauseous).  Sudden cold sweats. Women and people with diabetes may have other symptoms that are not usual, such as feeling:  Tired (fatigue).  Worried or nervous (anxious) for no reason.  Weak for no reason.  Dizzy or passing out (fainting). How is this treated? This condition may be treated with:  Medicines. These are given to: ? Prevent blood clots. ? Prevent heart attack. ? Relax blood vessels and improve blood flow to the heart (nitrates). ? Reduce blood pressure. ? Improve the pumping action of the heart. ? Reduce fat and cholesterol in the blood.  A procedure to widen a narrowed or blocked artery in the heart (angioplasty).  Surgery to allow blood to go around a blocked artery (coronary artery bypass surgery). Follow these instructions at home: Medicines  Take over-the-counter and prescription medicines only as told by your doctor.  Do not take these medicines unless your doctor says that you can: ? NSAIDs. These include:  Ibuprofen.  Naproxen. ? Vitamin supplements that have vitamin A, vitamin E, or both. ? Hormone therapy that contains estrogen with or without progestin. Eating and drinking   Eat a heart-healthy diet that includes: ? Lots of fresh fruits and vegetables. ? Whole grains. ? Low-fat (lean) protein. ? Low-fat dairy products.  Follow instructions from your doctor about what you cannot eat or drink. Activity  Follow an exercise program that your doctor tells you.  Talk with your doctor about joining a program to help improve the health of your heart (cardiac rehab).  When you feel tired, take a break. Plan breaks if you know you are going to feel tired. Lifestyle   Do not use any products that contain  nicotine or tobacco. This includes cigarettes, e-cigarettes, and chewing tobacco. If you need help quitting, ask your doctor.  If your doctor says you can drink alcohol: ? Limit how much you use to:  0-1 drink a day for women who are not pregnant.  0-2 drinks a day for men. ? Be aware of how much alcohol is in your drink. In the [place] one drink equals:  One 12 oz bottle of beer (355 mL).  One 5 oz glass of wine (148 mL).  One 1 oz glass of hard liquor (44 mL). General instructions  Stay at a healthy weight. If your doctor tells you to do so, work with him or her to lose weight.  Learn to deal with stress. If you need help, ask your doctor.  Keep your vaccines up to date. Get a flu shot every year.  Talk with your doctor if you feel sad. Take a screening test to see if you are at risk for depression.  Work with your doctor to manage any other health problems that you have. These may include diabetes or high blood pressure.  Keep all follow-up visits as told by your doctor. This is important. Get help right away if:  You have pain in your chest, neck, arm, jaw, or back, and the pain: ? Lasts more than a few minutes. ? Comes back. ? Does not get better after you take medicine under your tongue (sublingual nitroglycerin). ? Keeps getting worse. ? Comes more often.  You have any of these problems for no reason: ? Sweating a lot. ? Heartburn or upset stomach. ? Shortness of breath. ? Trouble breathing. ? Feeling sick to your stomach. ? Throwing up (vomiting). ? Feeling more tired than normal. ? Feeling nervous or worrying more than normal. ? Weakness.  You are suddenly dizzy or light-headed.  You pass out. These symptoms may be an emergency. Do not wait to see if the symptoms will go away. Get medical help right away. Call your local emergency services (911 in the [place] Do not drive yourself to the hospital. Summary  Angina is very bad discomfort or pain in the  chest, neck, arm, neck, or back.  Symptoms include chest pain, heartburn or upset stomach for no reason, and shortness of breath.  Women or people with diabetes may have symptoms that are not usual, such as feeling nervous or worried for no reason, weak for no reason, or tired.  Take all medicines only as told by your doctor.  You should eat a heart-healthy diet and follow an exercise program. This information is not intended to replace advice given to you by your health care provider. Make sure you discuss any questions you have with your health care provider. Document Released: 11/29/2007 Document Revised: 01/28/2018 Document Reviewed: 01/28/2018 Elsevier Patient Education  2020 [employer].

## 2018-12-31 NOTE — TOC Benefit Eligibility Note (Signed)
Transition of Care [facility]) Benefit Eligibility Note    Patient Details  Name: [name] MRN: [id number] Date of Birth: 1931-04-28   Medication/Dose: BRILINTA  90 MG BID  Covered?: Yes  Tier: 3 Drug  Prescription Coverage Preferred Pharmacy: [place]  M/O , 90 DAY SUPPLY FOR M/O $ 120.00  Spoke with Person/Company/Phone Number:: [name]  @  [employer] #  [phone]  Co-Pay: $ 47.00  Prior Approval: No  Deductible: Met  Additional Notes: TICAGRELOR : [name] Phone Number: 12/31/2018, 11:34 AM

## 2018-12-31 NOTE — Progress Notes (Signed)
CARDIAC REHAB PHASE I   PRE:  Rate/Rhythm: 70 SR To bathroom hurriedly and did not get to take BP    SaO2: 97%RA  MODE:  Ambulation: 370 ft   POST:  Rate/Rhythm: 93SR  BP:  Supine:   Sitting: 157/81  Standing:    SaO2: 100%RA [id number] Assisted pt to bathroom and then walked 370 ft on RA with hand held asst. Pt a little wobbly which she stated is normal for her. To bed after walk. No CP. Education completed with pt who voiced understanding. Stressed importance of brilinta with pt several times as she stated she did not take her antiplatelet with first stents and they occluded after a month. Reviewed NTG use, healthy food choices (pt gets Mobile Meals and has little control on what she gets). Pt only walks in house mainly so did not give written ex ed. Encouraged walking as tolerated. Pt has attended [facility] [place] before and interested in doing again. Legally blind and not interested in virtual ex APP. Referred to [place] [facility].   Graylon Good, RN BSN  12/31/2018 11:32 AM

## 2018-12-31 NOTE — Progress Notes (Signed)
Progress Note  Patient Name: [name] Date of Encounter: 12/31/2018  Primary Cardiologist: Dorris Carnes, MD   Subjective   Patient doing better today. Denies CP or SOB. Patient ambulating to the bathroom.   Inpatient Medications    Scheduled Meds: . amLODipine  2.5 mg Oral Daily  . amoxicillin-clavulanate  1 tablet Oral BID  . aspirin EC  81 mg Oral Daily  . fluticasone  1 spray Each Nare Daily  . imipramine  75 mg Oral QHS  . insulin lispro  0-15 Units Subcutaneous TID WC  . insulin lispro  0-5 Units Subcutaneous QHS  . insulin NPH Human  6 Units Subcutaneous BID AC & HS  . levothyroxine  100 mcg Oral QAC breakfast  . loratadine  10 mg Oral Daily  . montelukast  10 mg Oral QHS  . pantoprazole  40 mg Oral Daily  . polyethylene glycol  17 g Oral Daily  . predniSONE  10 mg Oral Q breakfast  . sodium chloride flush  3 mL Intravenous Q12H  . sodium chloride flush  3 mL Intravenous Q12H  . ticagrelor  90 mg Oral BID  . triazolam  0.25 mg Oral QHS   Continuous Infusions: . sodium chloride     PRN Meds: sodium chloride, acetaminophen, hydrOXYzine, lidocaine-prilocaine, nitroGLYCERIN, ondansetron (ZOFRAN) IV, sodium chloride flush, sorbitol   Vital Signs    Vitals:   12/30/18 1905 12/30/18 1930 12/30/18 1948 12/31/18 0506  BP: (!) 177/53 (!) 166/62 106/86 (!) 152/57  Pulse: 82 85 76 63  Resp: 15 18 15 12   Temp:   97.9 F (36.6 C) (!) 97.5 F (36.4 C)  TempSrc:   Oral Oral  SpO2: 99% 100% 92% 99%  Weight:    60.4 kg  Height:        Intake/Output Summary (Last 24 hours) at 12/31/2018 0845 Last data filed at 12/31/2018 0659 Gross per 24 hour  Intake 120 ml  Output 1750 ml  Net -1630 ml   Last 3 Weights 12/31/2018 12/30/2018 12/29/2018  Weight (lbs) 133 lb 1.6 oz 131 lb 11.2 oz 125 lb 8 oz  Weight (kg) 60.374 kg 59.739 kg 56.926 kg      Telemetry    NSR, HR 60-70s, no arrhythmias noted - Personally Reviewed  ECG    No new- Personally Reviewed  Physical  Exam   GEN: No acute distress.   Neck: No JVD Cardiac: RRR, no murmurs, rubs, or gallops.  Respiratory: Clear to auscultation bilaterally. GI: Soft, nontender, non-distended  MS: No edema; No deformity; Right wrist access site clean with no obvious hematoma, pulse 2+ Neuro:  Nonfocal  Psych: Normal affect   Labs    High Sensitivity Troponin:   Recent Labs  Lab 12/27/18 1652 12/27/18 2043 12/27/18 2329 12/28/18 0437 12/28/18 1052  TROPONINIHS 44* 44* 42* 35* 43*      Cardiac EnzymesNo results for input(s): TROPONINI in the last 168 hours. No results for input(s): TROPIPOC in the last 168 hours.   Chemistry Recent Labs  Lab 12/27/18 1652 12/30/18 0628 12/31/18 0424  NA 130* 134* 134*  K 3.7 3.5 3.4*  CL 92* 96* 100  CO2 27 26 24   GLUCOSE 366* 210* 215*  BUN 17 17 13   CREATININE 0.88 0.78 0.77  CALCIUM 8.4* 8.6* 8.3*  GFRNONAA 60* >60 >60  GFRAA >60 >60 >60  ANIONGAP 11 12 10      Hematology Recent Labs  Lab 12/27/18 1652 12/30/18 0628 12/31/18 0424  WBC  9.4 12.6* 10.3  RBC 4.63 4.48 4.15  HGB 12.6 12.5 11.5*  HCT 38.8 37.7 34.9*  MCV 83.8 84.2 84.1  MCH 27.2 27.9 27.7  MCHC 32.5 33.2 33.0  RDW 13.6 14.0 13.7  PLT 207 247 228    BNPNo results for input(s): BNP, PROBNP in the last 168 hours.   DDimer No results for input(s): DDIMER in the last 168 hours.   Radiology    No results found.  Cardiac Studies   LHC and Coronary Angiography 12/30/2018  Previously placed Prox RCA to Mid RCA stent (unknown type) is widely patent.  Prox RCA lesion is 40% stenosed.  Mid RCA to Dist RCA lesion is 99% stenosed.  Dist RCA lesion is 99% stenosed.  Non-stenotic Ost LM to Dist LM lesion.  Ost Cx to Prox Cx lesion is 70% stenosed.  1st Mrg lesion is 95% stenosed.  RPAV lesion is 40% stenosed.  Mid LAD lesion is 90% stenosed.  Mid Cx to Dist Cx lesion is 100% stenosed.  Prox Cx to Mid Cx lesion is 99% stenosed.   Assessment:  1. Severe 3v CAD  2. LM stent widely patent 3. LCX with ostial 70% stenosis due to "jailing" from LM stent    --OM-1 ostial 90%    --mid LCX subtotally occluded 4. LAD 90% mid 5. LVEF 60-65% with EDP 13  Patient Profile     85 y.o. female past medical history of coronary artery disease, scleroderma, Sjogren's, COPD, diabetes, hypertension, hyperlipidemia for evaluation of chest pain. Patient underwent LHC yesterday. Plan for possible discharge today.  Assessment & Plan    1. Angina Pectoris - Patient had Left Heart Cath yesterday that showed Severe 3v CAD, LM stent widely patent, LCX with ostial 70% stenosis due to "jailing" from LM stent (OM-1 ostial 90% and mid LCX subtotally occluded), LAD 90% mid, LVEF 60-65% with EDP 13.  -Stent x 2 placed in the Mid LAD and Mid RCA -Troponins flat 7/4 35>>43 -Denies recurrent CP/SOB. Patient able to ambulate to the bathroom with no issues. -Access site- right wrist clean with no hematoma, pulse 2+ -HMG stable 11.5, Creatinine 0.77 -Continue Brilinta and ASA -Likely discharge today  2. Hyperlipidemia -Fasting lipid panel yesterday LDL-119, TG-139, HDL-51, Total Cholesterol-198 -Numbers improved since last year -patient says she is statin intolerant -it makes her muscles ache  3. Hypertension -Amlodipine, home med -stable    For questions or updates, please contact [place] Please consult [email or web address] for contact info under        Signed, Juleen Sorrels Ninfa Meeker, PA-C  12/31/2018, 8:45 AM

## 2018-12-31 NOTE — Discharge Summary (Addendum)
Physician Discharge Summary  [name] TWS:[id number] DOB: [birth year] DOA: 12/27/2018  PCP: Merrilee Seashore, MD  Admit date: 12/27/2018 Discharge date: 12/31/2018  Admitted From: Home Disposition:  Home  Recommendations for Outpatient Follow-up:  1. Follow up with PCP in 1-2 weeks 2. Follow up with Cardiology as scheduled  Discharge Condition:Stable CODE STATUS:Full Diet recommendation: Diabetic, heart healthy   Brief/Interim Summary: 85 y.o.femalewith medical history significant ofSjogren disease, scleroderma on chronic steroids (managed by Dr. Estanislado Pandy), chronic sinusitis (managed by Dr. Lucia Gaskins with ENT), emphysema and abnormal CT with probable [place] (followed by pulmonology), type 2 diabetes, hypertension, hyperlipidemia, hypothyroidism, CAD status post PCI presenting to the hospital for evaluation of chest pain. Received nitroglycerin x2 by EMS and had relief. Chest pain-free upon arrival to the ED.  Patient states she has had 3 stents placed in her heart and her cardiologist is Dr. Dorris Carnes. States for the past 2 days she is experiencing substernal pressure-like chest pain which radiates to both sides of her jaw. Chest pain is only exertional and resolves in a few minutes after she rests. It is associated with dyspnea and nausea. States last night when she had chest pain again it was relieved after EMS gave her nitroglycerin. She thinks she had a fever at home. Reports having chronic sinus infections for which she was recently started on prednisone and Augmentin. Denies any chills or cough. Denies any vomiting or abdominal pain. Reports having chronic constipation and her last bowel movement was a week ago. States she has been passing a lot of gas. She does not like taking laxatives as they give her diarrhea.  ED Course:Hemodynamically stable.Afebrile and no leukocytosis.High-sensitivity troponin elevated 44>44.EKG not suggestive of ACS. Blood glucose  366, bicarb 27, anion gap 11. Chest x-ray personally reviewed showing no acute cardiopulmonary process.  Discharge Diagnoses:  Principal Problem:   Angina pectoris ([place]) Active Problems:   HTN (hypertension)   Chronic constipation   Type 2 diabetes mellitus (HCC)   CAD (coronary artery disease)   Chronic sinusitis   Chest pain   Angina, history of CAD status post multiple stents History concerning for anginal symptoms as chest pain is exertional and resolves with rest.  Hemodynamically stable. High-sensitivity troponin elevatedbut overall stable thus far. -Cardiology following EKG initially not suggestive of ACS.Chest x-ray showing no active disease. Stress test done in March 2019 was low risk. -Continued on Aspirin 81 mg, statin -Sublingual nitroglycerin as needed -Echocardiogram performed and reviewed. Normal LVEF -Cardiology following. Underwent heart cath 7/6, resulting in stent placement. Cardiology recommendation for addition of Brilinta at time of d/c  Uncontrolled type 2 diabetes Last A1c 8.0 in February 2020. Blood glucose 366 on arrival. Labs not suggestive of DKA. -Continued on SSI coverage as needed while in hospital -Glucose in the low 200's while on prednisone. Added low dose NPH while in hospital -Multiple insulin allergies documented. Recommend continued outpt mgt for diabetes  Chronic sinusitis -Afebrile and no leukocytosis. Followed by ENT. Recently started on a prednisone taper and Augmentin by her pulmonologist'soffice. -Continued on steroid taper and antibiotic, which may contribute to mild leukocytosis, see below -Stable at present  Chronic constipation -Reported last bowel movement was one week ago.  -Presently no complaints of abdominal pain and exam benign. -Multiple BM noted with cathartics  Hypertension -Continued on home amlodipine -BP stable currently  Hypothyroidism -Continue home Synthroid as tolerated -Stable  currently  Leukocytosis -Afebrile -Suspect in setting of prednisone -Normalized   Discharge Instructions  Discharge Instructions  Amb Referral to Cardiac Rehabilitation   Complete by: As directed    Diagnosis: Coronary Stents   After initial evaluation and assessments completed: Virtual Based Care may be provided alone or in conjunction with Phase 2 Cardiac Rehab based on patient barriers.: Yes     Allergies as of 12/31/2018      Reactions   Flomax [tamsulosin Hcl] Other (See Comments)   Made her 'trachea feel tight and couldn't swallow'   Peanut Oil Anaphylaxis, Swelling, Rash, Other (See Comments)   Throat tightens, also   Sulfa Antibiotics Anaphylaxis      Titanium Other (See Comments)   Neck wouldn't heal with titanium cervical plate   Yellow Dye Other (See Comments)   "Bee stings" all over body when eating/taking medications with red or yellow dye    Lantus [insulin Glargine] Other (See Comments)   Patient states "sulfate binder causes sinus infection/pneumonia".   Levaquin [levofloxacin In D5w] Other (See Comments)   Muscle aches   Metoprolol-hydrochlorothiazide Other (See Comments)   Decreased B/P   Norvasc [amlodipine] Swelling, Other (See Comments)   BIL Ankle edema   Novolog [insulin Aspart]    Patient states "sulfate binder causes sinus infection/pneumonia". Tolerates Humalog.   Penciclovir Other (See Comments)   Muscle Pain   Senna [sennosides] Other (See Comments)   Pt states that it causes a "stinging" sensation   Statins Other (See Comments)   All of them make me "feel badly"   Sulfonamide Derivatives Hives   Adhesive [tape] Hives, Rash   Maxidex [dexamethasone] Anxiety, Other (See Comments)   Insomnia and dizziness, too      Medication List    TAKE these medications   amLODipine 2.5 MG tablet Commonly known as: NORVASC Take 1 tablet (2.5 mg total) by mouth daily.   amoxicillin-clavulanate 875-125 MG tablet Commonly known as: Augmentin Take  1 tablet by mouth 2 (two) times daily.   aspirin EC 81 MG tablet Take 1 tablet (81 mg total) by mouth daily.   cetirizine 10 MG tablet Commonly known as: ZYRTEC Take 10 mg by mouth daily as needed for allergies.   esomeprazole 40 MG capsule Commonly known as: NEXIUM Take 40 mg by mouth daily.   ezetimibe 10 MG tablet Commonly known as: Zetia Take 1 tablet (10 mg total) by mouth daily.   fluticasone 50 MCG/ACT nasal spray Commonly known as: FLONASE Place 1 spray into both nostrils daily.   furosemide 20 MG tablet Commonly known as: LASIX Take 1 tablet (20 mg total) by mouth daily.   HumaLOG KwikPen 100 UNIT/ML KwikPen Generic drug: insulin lispro Inject 5 Units into the skin 3 (three) times daily.   HYDROcodone-acetaminophen 7.5-325 MG tablet Commonly known as: NORCO Take 0.5-1 tablets by mouth every 6 (six) hours as needed for moderate pain.   imipramine 25 MG tablet Commonly known as: TOFRANIL Take 75 mg by mouth at bedtime.   levothyroxine 100 MCG tablet Commonly known as: SYNTHROID Take 100 mcg by mouth daily before breakfast.   montelukast 10 MG tablet Commonly known as: SINGULAIR Take 10 mg by mouth at bedtime.   Opcon-A 0.027-0.315 % Soln Generic drug: Naphazoline-Pheniramine Place 1-2 drops into both eyes as needed (for allergies).   predniSONE 10 MG tablet Commonly known as: DELTASONE 4 tabs for 2 days, then 3 tabs for 2 days, 2 tabs for 2 days, then 1 tab for 2 days, then stop What changed: Another medication with the same name was removed. Continue taking this medication, and  follow the directions you see here.   ticagrelor 90 MG Tabs tablet Commonly known as: BRILINTA Take 1 tablet (90 mg total) by mouth 2 (two) times daily.   triazolam 0.25 MG tablet Commonly known as: HALCION Take 0.25 mg by mouth at bedtime.      Follow-up Information    Fay Records, MD Follow up.   Specialty: Cardiology Why: our office will call you with a hospital  follow-up visit in 1-2 weeks  Contact information: [place] [address] [phone]        Merrilee Seashore, MD. Schedule an appointment as soon as possible for a visit in 2 week(s).   Specialty: Internal Medicine Contact information: [place] [address] [phone]          Allergies  Allergen Reactions  . Flomax [Tamsulosin Hcl] Other (See Comments)    Made her 'trachea feel tight and couldn't swallow'  . Peanut Oil Anaphylaxis, Swelling, Rash and Other (See Comments)    Throat tightens, also  . Sulfa Antibiotics Anaphylaxis       . Titanium Other (See Comments)    Neck wouldn't heal with titanium cervical plate  . Yellow Dye Other (See Comments)    "Bee stings" all over body when eating/taking medications with red or yellow dye   . Lantus [Insulin Glargine] Other (See Comments)    Patient states "sulfate binder causes sinus infection/pneumonia".  [place] [Levofloxacin In D5w] Other (See Comments)    Muscle aches  . Metoprolol-Hydrochlorothiazide Other (See Comments)    Decreased B/P  . Norvasc [Amlodipine] Swelling and Other (See Comments)    BIL Ankle edema  . Novolog [Insulin Aspart]     Patient states "sulfate binder causes sinus infection/pneumonia". Tolerates Humalog.  [name] Penciclovir Other (See Comments)    Muscle Pain  . Senna [Sennosides] Other (See Comments)    Pt states that it causes a "stinging" sensation  . Statins Other (See Comments)    All of them make me "feel badly"  . Sulfonamide Derivatives Hives  . Adhesive [Tape] Hives and Rash  . Maxidex [Dexamethasone] Anxiety and Other (See Comments)    Insomnia and dizziness, too    Consultations:  Cardiology  Procedures/Studies: Dg Chest Portable 1 View  Result Date: 12/27/2018 CLINICAL DATA:  Patient with fever, cough and sore throat. EXAM: PORTABLE CHEST 1 VIEW COMPARISON:  Chest radiograph 08/28/2018 FINDINGS: Monitoring  leads overlie the patient. Stable cardiac and mediastinal contours. Aortic atherosclerosis. No large area pulmonary consolidation. No pleural effusion or pneumothorax. Thoracic spine degenerative changes. IMPRESSION: No acute cardiopulmonary process. Electronically Signed   By: Lovey Newcomer M.D.   On: 12/27/2018 16:53    Subjective: Eager to go home  Discharge Exam: Vitals:   12/30/18 1948 12/31/18 0506  BP: 106/86 (!) 152/57  Pulse: 76 63  Resp: 15 12  Temp: 97.9 F (36.6 C) (!) 97.5 F (36.4 C)  SpO2: 92% 99%   Vitals:   12/30/18 1905 12/30/18 1930 12/30/18 1948 12/31/18 0506  BP: (!) 177/53 (!) 166/62 106/86 (!) 152/57  Pulse: 82 85 76 63  Resp: 15 18 15 12   Temp:   97.9 F (36.6 C) (!) 97.5 F (36.4 C)  TempSrc:   Oral Oral  SpO2: 99% 100% 92% 99%  Weight:    60.4 kg  Height:        General: Pt is alert, awake, not in acute distress Cardiovascular: RRR, S1/S2 +, no  rubs, no gallops Respiratory: CTA bilaterally, no wheezing, no rhonchi Abdominal: Soft, NT, ND, bowel sounds + Extremities: no edema, no cyanosis   The results of significant diagnostics from this hospitalization (including imaging, microbiology, ancillary and laboratory) are listed below for reference.     Microbiology: Recent Results (from the past 240 hour(s))  Novel Coronavirus, NAA ([facility])     Status: None   Collection Time: 12/25/18 11:50 AM  Result Value Ref Range Status   SARS-CoV-2, NAA Not Detected Not Detected Final    Comment: Testing was performed using the cobas(R) SARS-CoV-2 test. This test was developed and its performance characteristics determined by [employer]. This test has not been FDA cleared or approved. This test has been authorized by FDA under an Emergency Use Authorization (EUA). This test is only authorized for the duration of time the declaration that circumstances exist justifying the authorization of the emergency use of in vitro diagnostic tests  for detection of SARS-CoV-2 virus and/or diagnosis of COVID-19 infection under section 564(b)(1) of the Act, 21 U.S.C. 101BPZ-0(C)(5), unless the authorization is terminated or revoked sooner. When diagnostic testing is negative, the possibility of a false negative result should be considered in the context of a patient's recent exposures and the presence of clinical signs and symptoms consistent with COVID-19. An individual without symptoms of COVID-19 and who is not shedding SARS-CoV-2 virus would expect to have a negati ve (not detected) result in this assay.   SARS Coronavirus 2 (CEPHEID- Performed in [place] hospital lab), Hosp Order     Status: None   Collection Time: 12/28/18 12:05 AM   Specimen: Nasopharyngeal Swab  Result Value Ref Range Status   SARS Coronavirus 2 NEGATIVE NEGATIVE Final    Comment: (NOTE) If result is NEGATIVE SARS-CoV-2 target nucleic acids are NOT DETECTED. The SARS-CoV-2 RNA is generally detectable in upper and lower  respiratory specimens during the acute phase of infection. The lowest  concentration of SARS-CoV-2 viral copies this assay can detect is 250  copies / mL. A negative result does not preclude SARS-CoV-2 infection  and should not be used as the sole basis for treatment or other  patient management decisions.  A negative result may occur with  improper specimen collection / handling, submission of specimen other  than nasopharyngeal swab, presence of viral mutation(s) within the  areas targeted by this assay, and inadequate number of viral copies  (<250 copies / mL). A negative result must be combined with clinical  observations, patient history, and epidemiological information. If result is POSITIVE SARS-CoV-2 target nucleic acids are DETECTED. The SARS-CoV-2 RNA is generally detectable in upper and lower  respiratory specimens dur ing the acute phase of infection.  Positive  results are indicative of active infection with SARS-CoV-2.   Clinical  correlation with patient history and other diagnostic information is  necessary to determine patient infection status.  Positive results do  not rule out bacterial infection or co-infection with other viruses. If result is PRESUMPTIVE POSTIVE SARS-CoV-2 nucleic acids MAY BE PRESENT.   A presumptive positive result was obtained on the submitted specimen  and confirmed on repeat testing.  While 2019 novel coronavirus  (SARS-CoV-2) nucleic acids may be present in the submitted sample  additional confirmatory testing may be necessary for epidemiological  and / or clinical management purposes  to differentiate between  SARS-CoV-2 and other Sarbecovirus currently known to infect humans.  If clinically indicated additional testing with an alternate test  methodology [phone]) is advised. The  SARS-CoV-2 RNA is generally  detectable in upper and lower respiratory sp ecimens during the acute  phase of infection. The expected result is Negative. Fact Sheet for Patients:  StrictlyIdeas.no Fact Sheet for Healthcare Providers: BankingDealers.co.za This test is not yet approved or cleared by the [place] FDA and has been authorized for detection and/or diagnosis of SARS-CoV-2 by FDA under an Emergency Use Authorization (EUA).  This EUA will remain in effect (meaning this test can be used) for the duration of the COVID-19 declaration under Section 564(b)(1) of the Act, 21 U.S.C. section 360bbb-3(b)(1), unless the authorization is terminated or revoked sooner. Performed at [facility], [place] [address], [place], [place] [address]      Labs: BNP (last 3 results) No results for input(s): BNP in the last 8760 hours. Basic Metabolic Panel: Recent Labs  Lab 12/27/18 1652 12/30/18 0628 12/31/18 0424  NA 130* 134* 134*  K 3.7 3.5 3.4*  CL 92* 96* 100  CO2 27 26 24   GLUCOSE 366* 210* 215*  BUN 17 17 13   CREATININE 0.88 0.78  0.77  CALCIUM 8.4* 8.6* 8.3*  MG 1.5*  --   --    Liver Function Tests: No results for input(s): AST, ALT, ALKPHOS, BILITOT, PROT, ALBUMIN in the last 168 hours. No results for input(s): LIPASE, AMYLASE in the last 168 hours. No results for input(s): AMMONIA in the last 168 hours. CBC: Recent Labs  Lab 12/27/18 1652 12/30/18 0628 12/31/18 0424  WBC 9.4 12.6* 10.3  HGB 12.6 12.5 11.5*  HCT 38.8 37.7 34.9*  MCV 83.8 84.2 84.1  PLT 207 247 228   Cardiac Enzymes: No results for input(s): CKTOTAL, CKMB, CKMBINDEX, TROPONINI in the last 168 hours. BNP: Invalid input(s): POCBNP CBG: Recent Labs  Lab 12/30/18 1212 12/30/18 1724 12/30/18 2108 12/31/18 0723 12/31/18 1134  GLUCAP 180* 199* 293* 135* 232*   D-Dimer No results for input(s): DDIMER in the last 72 hours. Hgb A1c No results for input(s): HGBA1C in the last 72 hours. Lipid Profile Recent Labs    12/30/18 0943  CHOL 198  HDL 51  LDLCALC 119*  TRIG 139  CHOLHDL 3.9   Thyroid function studies No results for input(s): TSH, T4TOTAL, T3FREE, THYROIDAB in the last 72 hours.  Invalid input(s): FREET3 Anemia work up No results for input(s): VITAMINB12, FOLATE, FERRITIN, TIBC, IRON, RETICCTPCT in the last 72 hours. Urinalysis    Component Value Date/Time   COLORURINE STRAW (A) 12/28/2018 1701   APPEARANCEUR CLEAR 12/28/2018 1701   LABSPEC 1.009 12/28/2018 1701   PHURINE 8.0 12/28/2018 1701   GLUCOSEU >=500 (A) 12/28/2018 1701   GLUCOSEU NEGATIVE 03/24/2010 1137   HGBUR SMALL (A) 12/28/2018 1701   BILIRUBINUR NEGATIVE 12/28/2018 1701   KETONESUR NEGATIVE 12/28/2018 1701   PROTEINUR 100 (A) 12/28/2018 1701   UROBILINOGEN 0.2 05/01/2015 2019   NITRITE NEGATIVE 12/28/2018 1701   LEUKOCYTESUR MODERATE (A) 12/28/2018 1701   Sepsis Labs Invalid input(s): PROCALCITONIN,  WBC,  LACTICIDVEN Microbiology Recent Results (from the past 240 hour(s))  Novel Coronavirus, NAA ([facility])     Status: None   Collection  Time: 12/25/18 11:50 AM  Result Value Ref Range Status   SARS-CoV-2, NAA Not Detected Not Detected Final    Comment: Testing was performed using the cobas(R) SARS-CoV-2 test. This test was developed and its performance characteristics determined by [employer]. This test has not been FDA cleared or approved. This test has been authorized by FDA under an Emergency Use Authorization (EUA).  This test is only authorized for the duration of time the declaration that circumstances exist justifying the authorization of the emergency use of in vitro diagnostic tests for detection of SARS-CoV-2 virus and/or diagnosis of COVID-19 infection under section 564(b)(1) of the Act, 21 U.S.C. 800LKJ-1(P)(9), unless the authorization is terminated or revoked sooner. When diagnostic testing is negative, the possibility of a false negative result should be considered in the context of a patient's recent exposures and the presence of clinical signs and symptoms consistent with COVID-19. An individual without symptoms of COVID-19 and who is not shedding SARS-CoV-2 virus would expect to have a negati ve (not detected) result in this assay.   SARS Coronavirus 2 (CEPHEID- Performed in [place] hospital lab), Hosp Order     Status: None   Collection Time: 12/28/18 12:05 AM   Specimen: Nasopharyngeal Swab  Result Value Ref Range Status   SARS Coronavirus 2 NEGATIVE NEGATIVE Final    Comment: (NOTE) If result is NEGATIVE SARS-CoV-2 target nucleic acids are NOT DETECTED. The SARS-CoV-2 RNA is generally detectable in upper and lower  respiratory specimens during the acute phase of infection. The lowest  concentration of SARS-CoV-2 viral copies this assay can detect is 250  copies / mL. A negative result does not preclude SARS-CoV-2 infection  and should not be used as the sole basis for treatment or other  patient management decisions.  A negative result may occur with  improper specimen collection /  handling, submission of specimen other  than nasopharyngeal swab, presence of viral mutation(s) within the  areas targeted by this assay, and inadequate number of viral copies  (<250 copies / mL). A negative result must be combined with clinical  observations, patient history, and epidemiological information. If result is POSITIVE SARS-CoV-2 target nucleic acids are DETECTED. The SARS-CoV-2 RNA is generally detectable in upper and lower  respiratory specimens dur ing the acute phase of infection.  Positive  results are indicative of active infection with SARS-CoV-2.  Clinical  correlation with patient history and other diagnostic information is  necessary to determine patient infection status.  Positive results do  not rule out bacterial infection or co-infection with other viruses. If result is PRESUMPTIVE POSTIVE SARS-CoV-2 nucleic acids MAY BE PRESENT.   A presumptive positive result was obtained on the submitted specimen  and confirmed on repeat testing.  While 2019 novel coronavirus  (SARS-CoV-2) nucleic acids may be present in the submitted sample  additional confirmatory testing may be necessary for epidemiological  and / or clinical management purposes  to differentiate between  SARS-CoV-2 and other Sarbecovirus currently known to infect humans.  If clinically indicated additional testing with an alternate test  methodology [phone]) is advised. The SARS-CoV-2 RNA is generally  detectable in upper and lower respiratory sp ecimens during the acute  phase of infection. The expected result is Negative. Fact Sheet for Patients:  StrictlyIdeas.no Fact Sheet for Healthcare Providers: BankingDealers.co.za This test is not yet approved or cleared by the [place] FDA and has been authorized for detection and/or diagnosis of SARS-CoV-2 by FDA under an Emergency Use Authorization (EUA).  This EUA will remain in effect (meaning this  test can be used) for the duration of the COVID-19 declaration under Section 564(b)(1) of the Act, 21 U.S.C. section 360bbb-3(b)(1), unless the authorization is terminated or revoked sooner. Performed at [facility], [place] [address], [place], [place] [address]    Time spent: 30 min  SIGNED:   Marylu Lund, MD  [facility] 12/31/2018, 2:10 PM  If 7PM-7AM, please contact night-coverage

## 2019-01-01 ENCOUNTER — Telehealth: Payer: Self-pay | Admitting: Internal Medicine

## 2019-01-01 ENCOUNTER — Telehealth: Payer: Self-pay | Admitting: Cardiology

## 2019-01-01 NOTE — Telephone Encounter (Signed)
New Message ° ° ° ° °  ° ° °COVID-19 Pre-Screening Questions: ° °• In the past 7 to 10 days have you had a cough,  shortness of breath, headache, congestion, fever (100 or greater) body aches, chills, sore throat, or sudden loss of taste or sense of smell? °NO °• Have you been around anyone with known Covid 19. °NO °• Have you been around anyone who is awaiting Covid 19 test results in the past 7 to 10 days? °Pt was tested and it was NEG  °• Have you been around anyone who has been exposed to Covid 19, or has mentioned symptoms of Covid 19 within the past 7 to 10 days? °NO ° °If you have any concerns/questions about symptoms patients report during screening (either on the phone or at threshold). Contact the provider seeing the patient or DOD for further guidance.  If neither are available contact a member of the leadership team. ° ° ° °   ° ° ° ° ° °

## 2019-01-01 NOTE — Telephone Encounter (Signed)
I saw phone note from today  Yesterday felt great   Today tired  SOB   NO CP BP 140/80   P 80     No swelling in arm (site) or elsewhere  Appt has been set up for tomorrow I have encouraged her to keep on same meds    She has had multiple drug sensitivities   ? Rxn to plavix in 2007 or 2010   I will need to review Hx of CVA in 1991    No note   Will not use Effient

## 2019-01-01 NOTE — Telephone Encounter (Addendum)
Pt called to report that she has had a terrible day.. her daughter just went to her house and tells me that she was not in good shape.. she has been very weak and very SOB and it has been constant.  She denies chest pain, but some dizziness with her not breathing well. She says it seems to be related to taking the Brilinta.. I advised her that sh can try drinking some caffeine with her dose to see if helps her symptoms..    I also advised them that if it is not related to taking the Brilinta.. then she may have something more acute going on with her and since her daughter says that she looks really bad.. then they should consider calling EMS or going back to the hospital since our office in closed... but the pt declined. She says she will not go to the hospital but agrees if it worsens and the caffeine does not help. I explained to her that she absolutely cannot miss a dose and she agrees.   Pt will go to the ER if she feels worse and no improvement if not will keep her appt tomorrow for her post hosp f/u. She denies cough, sore throat, fever.       COVID-19 Pre-Screening Questions:  . In the past 7 to 10 days have you had a cough,  shortness of breath, headache, congestion, fever (100 or greater) body aches, chills, sore throat, or sudden loss of taste or sense of smell? NO . Have you been around anyone with known Covid 19. NO . Have you been around anyone who is awaiting Covid 19 test results in the past 7 to 10 days? NO . Have you been around anyone who has been exposed to Covid 19, or has mentioned symptoms of Covid 19 within the past 7 to 10 days? NO  If you have any concerns/questions about symptoms patients report during screening (either on the phone or at threshold). Contact the provider seeing the patient or DOD for further guidance.  If neither are available contact a member of the leadership team.

## 2019-01-01 NOTE — Telephone Encounter (Signed)
New Message     Pt can barely move around and walk. Her daughter said when she brought her home from the hospital yesterday she was doing well and today has been a bad day She said she has Dizziness and SOB  She is scheduled to see Daune Perch tomorrow

## 2019-01-02 ENCOUNTER — Other Ambulatory Visit: Payer: Self-pay

## 2019-01-02 ENCOUNTER — Encounter: Payer: Self-pay | Admitting: Cardiology

## 2019-01-02 ENCOUNTER — Ambulatory Visit (INDEPENDENT_AMBULATORY_CARE_PROVIDER_SITE_OTHER): Payer: Medicare Other | Admitting: Cardiology

## 2019-01-02 VITALS — BP 128/50 | HR 81 | Ht 62.0 in | Wt 131.1 lb

## 2019-01-02 DIAGNOSIS — I2510 Atherosclerotic heart disease of native coronary artery without angina pectoris: Secondary | ICD-10-CM

## 2019-01-02 DIAGNOSIS — R0602 Shortness of breath: Secondary | ICD-10-CM

## 2019-01-02 DIAGNOSIS — R5383 Other fatigue: Secondary | ICD-10-CM

## 2019-01-02 DIAGNOSIS — R42 Dizziness and giddiness: Secondary | ICD-10-CM | POA: Diagnosis not present

## 2019-01-02 DIAGNOSIS — I200 Unstable angina: Secondary | ICD-10-CM | POA: Diagnosis not present

## 2019-01-02 DIAGNOSIS — I10 Essential (primary) hypertension: Secondary | ICD-10-CM | POA: Diagnosis not present

## 2019-01-02 NOTE — Progress Notes (Addendum)
Cardiology Office Note:    Date:  01/02/2019   ID:  [name], DOB Sep 06, 1930, MRN [id number]  PCP:  Merrilee Seashore, MD  Cardiologist:  Dorris Carnes, MD  Referring MD: Merrilee Seashore, MD   Chief Complaint  Patient presents with   Hospitalization Follow-up    CAD s/p PCI   Shortness of Breath    and weakness    History of Present Illness:    [name] is a 85 y.o. female with a past medical history significant for CAD(prior MI with stenting of RCA with repeat PTCA/stenting for ISR in 01/2008, stent to LM 11/2008), prior Dressler's syndrome post-MI, scleroderma on chronic steroids, Sjogren's syndrome, COPD and probable BOOP, DM type 2, HTN, HLD, hypothyroidism, blindness, sleep apnea, CVA 1990's. Has hx of statin intolerance.   Pt was admitted to the hospital 12/27/18-12/31/18 with chest pain. HS troponins remained flat with low level elevation. She was taken for [facility] on 12/30/2018 that revealed Severe 3v CAD, LM stent widely patent, LCX with ostial 70% stenosis due to "jailing" from LM stent (OM-1 ostial 90% and mid LCX subtotally occluded), LAD 90% mid, LVEF 60-65% with EDP 13. Stent x 2 placed in the Mid LAD and Mid RCA. She was started on Brilinta and aspirin 81 mg. Echo showed normal LV function.   Pt called office yesterday with complaints of feeling weak and short of breath. Dr. Harrington Challenger noted that pt has had multiple drug sensitivities including Rxn to plavix in 2007 or 2010 with rash. She may be unable to tolerate Brilinta. She has Hx of CVA in 1992 so cannot use Effient.   Today she is here with her daughter. She complains of shortness of breath and chest tightness. She says she is short of breath at rest and much more so with any exertion at all. She is unable to do any ADLs. She feels sure that it is related to the Brilinta as she did not have this prior to her stents and taking Brilinta. Pt says that scleroderma causes her to have bad side effects from many drugs. She  is not having the chest pain, jaw pain or nausea like she had prior to the stents.   Pt looked fine sitting in the wheelchair. I walked with the patient and she is very weak and has DOE. Her O2 sat did not get below 97%. She appears to have a very dry mouth, likely related to Shogren's. She is lightheaded with standing. She is drinking water from her water bottle.   Past Medical History:  Diagnosis Date   Adenomatous colon polyp    Arthritis    Blood transfusion    Cancer ([place])    cervical cancer pre hysterectomy   Cervical disc disease    Chronic sinusitis    CONSTIPATION, CHRONIC 06/26/2007   Qualifier: Diagnosis of  By: Nils Pyle CMA (AAMA), Mearl Latin     COPD (chronic obstructive pulmonary disease) ([place])    Coronary artery disease    prior MI with stenting of RCA with repeat PTCA/stenting for ISR in 01/2008, stent to LM 11/2008   CVA (cerebral infarction) 1991   Depression    Diabetes mellitus    Diverticulosis    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H[facility])    Dyslipidemia    Emphysema    Fibromyalgia    GERD (gastroesophageal reflux disease)    Hypertension    Hypothyroidism    IBS (irritable bowel syndrome)    Legally blind    Macular degeneration  Myocardial infarction [facility]) may 27th 2007   Pneumonia 07/16/2013    HX PNEUMONIA   Pulmonary hypertension ([place])    Raynaud's phenomenon    Scleroderma ([place])    Sjogren's disease ([place])    Sleep apnea    STOP BANG SCORE 5   Stroke ([place]) 1990   brain stem stroke - weakness rt hand   Zenker's diverticulum     Past Surgical History:  Procedure Laterality Date   APPENDECTOMY     BALLOON DILATION N/A 07/15/2015   Procedure: BALLOON DILATION;  Surgeon: Milus Banister, MD;  Location: [facility];  Service: Endoscopy;  Laterality: N/A;   BILATERAL OOPHORECTOMY  2002   BRONCHOSCOPY  02-22-09   CARDIAC CATHETERIZATION     x 3   CATARACT EXTRACTION     CHOLECYSTECTOMY  1965   COLON SURGERY  2014   colon  resection   colonectomy     CORONARY STENT INTERVENTION N/A 12/30/2018   Procedure: CORONARY STENT INTERVENTION;  Surgeon: Sherren Mocha, MD;  Location: [place] [facility];  Service: Cardiovascular;  Laterality: N/A;   ESOPHAGOGASTRODUODENOSCOPY (EGD) WITH ESOPHAGEAL DILATION N/A 06/18/2013   Procedure: ESOPHAGOGASTRODUODENOSCOPY (EGD) WITH ESOPHAGEAL DILATION;  Surgeon: Inda Castle, MD;  Location: [place];  Service: Endoscopy;  Laterality: N/A;   FLEXIBLE SIGMOIDOSCOPY N/A 07/15/2015   Procedure: FLEXIBLE SIGMOIDOSCOPY;  Surgeon: Milus Banister, MD;  [facility];  Service: Endoscopy;  Laterality: N/A;   LEFT HEART CATH AND CORONARY ANGIOGRAPHY N/A 12/30/2018   Procedure: LEFT HEART CATH AND CORONARY ANGIOGRAPHY;  Surgeon: Jolaine Artist, MD;  Location: [place] [facility];  Service: Cardiovascular;  Laterality: N/A;   lobe thyroid removal     PROCTOSCOPY  03/18/2012   Procedure: PROCTOSCOPY;  Surgeon: Adin Hector, MD;  Location: [facility];  Service: General;  Laterality: N/A;  rigid procto   RADIOLOGY WITH ANESTHESIA N/A 03/09/2015   Procedure: MRI CERVICAL SPINE WITHOUT AND LUMBER WITHOUT;  Surgeon: Medication Radiologist, MD;  Location: [place];  Service: Radiology;  Laterality: N/A;   SPINAL FUSION  1997   and implantation of a plate    STENTED CARDIAC  ARTERY  2007 / 2007 / 2010   X 3 STENT   THYROID LOBECTOMY  1991   removal of left lobe thyroid   TOTAL ABDOMINAL HYSTERECTOMY  1973    Current Medications: Current Meds  Medication Sig   amLODipine (NORVASC) 2.5 MG tablet Take 1 tablet (2.5 mg total) by mouth daily.   amoxicillin-clavulanate (AUGMENTIN) 875-125 MG tablet Take 1 tablet by mouth 2 (two) times daily.   aspirin EC 81 MG tablet Take 1 tablet (81 mg total) by mouth daily.   cetirizine (ZYRTEC) 10 MG tablet Take 10 mg by mouth daily as needed for allergies.   esomeprazole (NEXIUM) 40 MG capsule Take 40 mg by mouth daily.    ezetimibe  (ZETIA) 10 MG tablet Take 1 tablet (10 mg total) by mouth daily.   fluticasone (FLONASE) 50 MCG/ACT nasal spray Place 1 spray into both nostrils daily.   furosemide (LASIX) 20 MG tablet Take 1 tablet (20 mg total) by mouth daily.   HYDROcodone-acetaminophen (NORCO) 7.5-325 MG tablet Take 0.5-1 tablets by mouth every 6 (six) hours as needed for moderate pain.    imipramine (TOFRANIL) 25 MG tablet Take 75 mg by mouth at bedtime.    insulin lispro (HUMALOG KWIKPEN) 100 UNIT/ML KwikPen Inject 5 Units into the skin 3 (three) times daily.    levothyroxine (SYNTHROID,  LEVOTHROID) 100 MCG tablet Take 100 mcg by mouth daily before breakfast.   montelukast (SINGULAIR) 10 MG tablet Take 10 mg by mouth at bedtime.    Naphazoline-Pheniramine (OPCON-A) 0.027-0.315 % SOLN Place 1-2 drops into both eyes as needed (for allergies).   predniSONE (DELTASONE) 5 MG tablet Take 5 mg by mouth daily.   ticagrelor (BRILINTA) 90 MG TABS tablet Take 1 tablet (90 mg total) by mouth 2 (two) times daily.   triazolam (HALCION) 0.25 MG tablet Take 0.25 mg by mouth at bedtime.      Allergies:   Flomax [tamsulosin hcl], Peanut oil, Sulfa antibiotics, Titanium, Yellow dye, Lantus [insulin glargine], Levaquin [levofloxacin in d5w], Metoprolol-hydrochlorothiazide, Norvasc [amlodipine], Novolog [insulin aspart], Penciclovir, Senna [sennosides], Statins, Sulfonamide derivatives, Adhesive [tape], and Maxidex [dexamethasone]   Social History   Socioeconomic History   Marital status: Divorced    Spouse name: Not on file   Number of children: Not on file   Years of education: Not on file   Highest education level: Not on file  Occupational History   Occupation: retired    Fish farm manager: RETIRED  Scientist, product/process development strain: Not on file   Food insecurity    Worry: Not on file    Inability: Not on file   Transportation needs    Medical: Not on file    Non-medical: Not on file  Tobacco Use   Smoking  status: Former Smoker    Packs/day: 2.00    Years: 40.00    Pack years: 80.00    Types: Cigarettes    Start date: 1949    Quit date: 1989    Years since quitting: 31.5   Smokeless tobacco: Never Used  Substance and Sexual Activity   Alcohol use: No   Drug use: No   Sexual activity: Not on file  Lifestyle   Physical activity    Days per week: Not on file    Minutes per session: Not on file   Stress: Not on file  Relationships   Social connections    Talks on phone: Patient refused    Gets together: Patient refused    Attends religious service: Patient refused    Active member of club or organization: Patient refused    Attends meetings of clubs or organizations: Patient refused    Relationship status: Patient refused  Other Topics Concern   Not on file  Social History Narrative   Not on file     Family History: The patient's family history includes Cancer in her brother and sister; Diabetes in her brother, brother, father, and sister; Heart disease in her father; Other (age of onset: 91) in her mother; Stroke (age of onset: 55) in her father. ROS:   Please see the history of present illness.     All other systems reviewed and are negative.  EKGs/Labs/Other Studies Reviewed:    The following studies were reviewed today:  LHC and Coronary Angiography 12/30/2018  Previously placed Prox RCA to Mid RCA stent (unknown type) is widely patent.  Prox RCA lesion is 40% stenosed.  Mid RCA to Dist RCA lesion is 99% stenosed.  Dist RCA lesion is 99% stenosed.  Non-stenotic Ost LM to Dist LM lesion.  Ost Cx to Prox Cx lesion is 70% stenosed.  1st Mrg lesion is 95% stenosed.  RPAV lesion is 40% stenosed.  Mid LAD lesion is 90% stenosed.  Mid Cx to Dist Cx lesion is 100% stenosed.  Prox Cx to Mid Cx lesion  is 99% stenosed. Assessment: 1. Severe 3v CAD 2. LM stent widely patent 3. LCX with ostial 70% stenosis due to "jailing" from LM stent --OM-1 ostial  90% --mid LCX subtotally occluded 4. LAD 90% mid 5. LVEF 60-65% with EDP 13  CORONARY STENT INTERVENTION 12/30/2018  Conclusion   Mid LAD lesion is 90% stenosed.  A drug-eluting stent was successfully placed using a STENT RESOLUTE ONYX 2.5X18.  Post intervention, there is a 0% residual stenosis.  Mid RCA to Dist RCA lesion is 95% stenosed.  Prox RCA to Mid RCA lesion is 20% stenosed.  A drug-eluting stent was successfully placed using a STENT RESOLUTE ONYX 3.0X30.  Post intervention, there is a 0% residual stenosis.   Successful two-vessel PCI with overlapping 3.0 x 30 mm and 3.0 x 12 mm resolute Onyx DES in the RCA and a single 2.5 x 18 mm resolute Onyx DES in the LAD   Recommendations Antiplatelet/Anticoag Recommend uninterrupted dual antiplatelet therapy with Aspirin 81mg  daily and Ticagrelor 90mg  twice daily for a minimum of 12 months (ACS-Class I recommendation).   Echocardiogram 12/28/2018 IMPRESSIONS  1. The left ventricle has normal systolic function with an ejection fraction of 60-65%. The cavity size was normal. There is mild concentric left ventricular hypertrophy. Left ventricular diastolic Doppler parameters are consistent with  pseudonormalization. Elevated left atrial and left ventricular end-diastolic pressures.  2. The right ventricle has normal systolic function. The cavity was normal. There is no increase in right ventricular wall thickness.  3. The mitral valve is degenerative. Moderate thickening of the mitral valve leaflet. Mild calcification of the mitral valve leaflet. There is mild mitral annular calcification present. Mitral valve regurgitation is moderate by color flow Doppler. No  evidence of mitral valve stenosis.  4. The aortic valve is tricuspid. Mild thickening of the aortic valve. Aortic valve regurgitation is mild by color flow Doppler. No stenosis of the aortic valve.   EKG:  EKG is ordered today.  The ekg ordered today demonstrates NSR, 82  bpm, TWI V4-6, flattened T waves V1-3. No significant change from 12/30/18.   Recent Labs: 07/30/2018: TSH 1.016 08/03/2018: ALT 18 12/27/2018: Magnesium 1.5 12/31/2018: BUN 13; Creatinine, Ser 0.77; Hemoglobin 11.5; Platelets 228; Potassium 3.4; Sodium 134   Recent Lipid Panel    Component Value Date/Time   CHOL 198 12/30/2018 0943   TRIG 139 12/30/2018 0943   HDL 51 12/30/2018 0943   CHOLHDL 3.9 12/30/2018 0943   VLDL 28 12/30/2018 0943   LDLCALC 119 (H) 12/30/2018 0943   LDLDIRECT 127.6 05/15/2013 1422    Physical Exam:    VS:  BP (!) 128/50    Pulse 81    Ht 5\' 2"  (1.575 m)    Wt 131 lb 1.9 oz (59.5 kg)    SpO2 96%    BMI 23.98 kg/m     Wt Readings from Last 3 Encounters:  01/02/19 131 lb 1.9 oz (59.5 kg)  12/31/18 133 lb 1.6 oz (60.4 kg)  10/11/18 127 lb (57.6 kg)     Physical Exam  Constitutional: She is oriented to person, place, and time. She appears well-developed and well-nourished. No distress.  HENT:  Head: Normocephalic and atraumatic.  Dry mouth  Neck: Normal range of motion. Neck supple. No JVD present.  Cardiovascular: Normal rate, regular rhythm, normal heart sounds and intact distal pulses. Exam reveals no gallop and no friction rub.  No murmur heard. Pulmonary/Chest: Effort normal and breath sounds normal. No respiratory distress. She has no wheezes.  She has no rales.  Abdominal: Soft. Bowel sounds are normal.  Musculoskeletal:        General: No edema.  Neurological: She is alert and oriented to person, place, and time.  Skin: Skin is warm and dry.  Psychiatric: She has a normal mood and affect. Her behavior is normal. Judgment and thought content normal.  Vitals reviewed.   ASSESSMENT:    1. SOB (shortness of breath)   2. Coronary artery disease involving native coronary artery of native heart without angina pectoris   3. Essential hypertension   4. Fatigue, unspecified type   5. Hypertension, unspecified type   6. Dizziness    PLAN:    In order  of problems listed above:  CAD s/p DES to mid LAD and mid RCA 12/30/18 Started on brilinta and aspirin 81 mg. Not on statin d/t intolerance. She has hx of possible reaction to Plavix in 2007 or 2010. Pt now with weakness and shortness of breath. No chest discomfort or symptoms similar to prior to her stent. EKG relatively unchanged.   Shortness of breath: most likely related to Brilinta. Pt has many medication intolerances including Plavix causing a severe rash. She is also quite weak and having severe activity intolerance. Her wt is not up and she does not appear significantly volume overloaded. I walked with her and had to hold onto her. Her O2 sats stayed above 97%. I offered for pt to go back to hospital but she really wants to avoid that.  Can't be on Effient due to hx of stroke.  Case discussed with Dr. Harrington Challenger. Pt has tolerated Ticlid in the past but according to [employer] it is no longer available in the [place], only in [place]. Dr. Harrington Challenger is going to look into seeing if can get from [place].  For now we will check labs including BNP, BMet, CBC and Sed rate. Pt instructed to drink 1-2 cups of coffee with caffeine per day to combat side effects of Brilinta.   Hyperlipidemia: Has been statin intolerant. LDL 119. Started ezetimibe 10 mg daily. Consider PCSK9i evaluation as outpatient through the lipid clinic. Recheck lipid panel at next visit. Goal LDL < 70.  Hypertension -On amlodipine. BP well controlled.   Medication Adjustments/Labs and Tests Ordered: Current medicines are reviewed at length with the patient today.  Concerns regarding medicines are outlined above. Labs and tests ordered and medication changes are outlined in the patient instructions below:  Patient Instructions  Medication Instructions:  Your physician recommends that you continue on your current medications as directed. Please refer to the Current Medication list given to you today.  If you need a refill on your cardiac  medications before your next appointment, please call your pharmacy.   Lab work: TODAY: BMET, BNP, CBC, SED RATE If you have labs (blood work) drawn today and your tests are completely normal, you will receive your results only by:  [place] (if you have MyChart) OR  A paper copy in the mail If you have any lab test that is abnormal or we need to change your treatment, we will call you to review the results.  Testing/Procedures: NONE  Follow-Up: At [facility], you and your health needs are our priority.  As part of our continuing mission to provide you with exceptional heart care, we have created designated Provider Care Teams.  These Care Teams include your primary Cardiologist (physician) and Advanced Practice Providers (APPs -  Physician Assistants and Nurse Practitioners) who all work together  to provide you with the care you need, when you need it. You will need a follow up appointment in:  2 months.  Please call our office 2 months in advance to schedule this appointment.  You may see Dorris Carnes, MD or one of the following Advanced Practice Providers on your designated Care Team: Richardson Dopp, PA-C [place], [place]  Daune Perch, NP  Any Other Special Instructions Will Be Listed Below (If Applicable).  .Try to drink 1-2 cups of cafeinated coffee per day to combat side effects of Brilinta.         Signed, Daune Perch, NP  01/02/2019 5:00 PM    [facility]  Addendum:  Lab was unable to draw blood after several attempts. I called pt's daughter to discuss. She will bring pt back in the morning to retry blood draw. I advised her to have pt drink 2 glasses of water prior to coming. I also advised that if pt symptoms get any worse to take pt to the hospital.  I sent Dr. Harrington Challenger a message.

## 2019-01-02 NOTE — Patient Instructions (Addendum)
Medication Instructions:  Your physician recommends that you continue on your current medications as directed. Please refer to the Current Medication list given to you today.  If you need a refill on your cardiac medications before your next appointment, please call your pharmacy.   Lab work: TODAY: BMET, BNP, CBC, SED RATE If you have labs (blood work) drawn today and your tests are completely normal, you will receive your results only by: [name] MyChart Message (if you have MyChart) OR . A paper copy in the mail If you have any lab test that is abnormal or we need to change your treatment, we will call you to review the results.  Testing/Procedures: NONE  Follow-Up: At [facility], you and your health needs are our priority.  As part of our continuing mission to provide you with exceptional heart care, we have created designated Provider Care Teams.  These Care Teams include your primary Cardiologist (physician) and Advanced Practice Providers (APPs -  Physician Assistants and Nurse Practitioners) who all work together to provide you with the care you need, when you need it. You will need a follow up appointment in:  2 months.  Please call our office 2 months in advance to schedule this appointment.  You may see Dorris Carnes, MD or one of the following Advanced Practice Providers on your designated Care Team: Richardson Dopp, PA-C [place], [place] . Daune Perch, NP  Any Other Special Instructions Will Be Listed Below (If Applicable).  .Try to drink 1-2 cups of cafeinated coffee per day to combat side effects of Brilinta.

## 2019-01-03 ENCOUNTER — Other Ambulatory Visit: Payer: Self-pay | Admitting: Cardiology

## 2019-01-03 ENCOUNTER — Other Ambulatory Visit: Payer: Medicare Other | Admitting: *Deleted

## 2019-01-03 DIAGNOSIS — E876 Hypokalemia: Secondary | ICD-10-CM

## 2019-01-03 LAB — CBC
Hematocrit: 35.6 % (ref 34.0–46.6)
Hemoglobin: 12 g/dL (ref 11.1–15.9)
MCH: 28.4 pg (ref 26.6–33.0)
MCHC: 33.7 g/dL (ref 31.5–35.7)
MCV: 84 fL (ref 79–97)
Platelets: 249 10*3/uL (ref 150–450)
RBC: 4.23 x10E6/uL (ref 3.77–5.28)
RDW: 13.9 % (ref 11.7–15.4)
WBC: 11.9 10*3/uL — ABNORMAL HIGH (ref 3.4–10.8)

## 2019-01-03 LAB — BASIC METABOLIC PANEL
BUN/Creatinine Ratio: 17 (ref 12–28)
BUN: 16 mg/dL (ref 8–27)
CO2: 25 mmol/L (ref 20–29)
Calcium: 8.5 mg/dL — ABNORMAL LOW (ref 8.7–10.3)
Chloride: 87 mmol/L — ABNORMAL LOW (ref 96–106)
Creatinine, Ser: 0.92 mg/dL (ref 0.57–1.00)
GFR calc Af Amer: 66 mL/min/{1.73_m2} (ref >59)
GFR calc non Af Amer: 57 mL/min/{1.73_m2} — ABNORMAL LOW (ref >59)
Glucose: 262 mg/dL — ABNORMAL HIGH (ref 65–99)
Potassium: 3.3 mmol/L — ABNORMAL LOW (ref 3.5–5.2)
Sodium: 128 mmol/L — ABNORMAL LOW (ref 134–144)

## 2019-01-03 LAB — SEDIMENTATION RATE: Sed Rate: 79 mm/hr — ABNORMAL HIGH (ref 0–40)

## 2019-01-03 LAB — PRO B NATRIURETIC PEPTIDE: NT-Pro BNP: 786 pg/mL — ABNORMAL HIGH (ref 0–738)

## 2019-01-03 MED ORDER — POTASSIUM CHLORIDE ER 20 MEQ PO TBCR: 20.0000 meq | EXTENDED_RELEASE_TABLET | Freq: Every day | ORAL | 3 refills | Status: AC

## 2019-01-06 ENCOUNTER — Other Ambulatory Visit: Payer: Self-pay | Admitting: *Deleted

## 2019-01-06 DIAGNOSIS — E876 Hypokalemia: Secondary | ICD-10-CM

## 2019-01-07 ENCOUNTER — Telehealth (HOSPITAL_COMMUNITY): Payer: Self-pay

## 2019-01-07 NOTE — Telephone Encounter (Signed)
Pt insurance is active and benefits verified through Medicare A/B. Co-pay $0.00, DED $198.00/$198.00 met, out of pocket $0.00/$0.00 met, co-insurance 20%. No pre-authorization required. Passport, 01/07/2019 @ 2:07PM, TRR#[id number]  2ndary insurance is active and benefits verified through [employer]. Co-pay $0.00, DED $0.00/$0.00 met, out of pocket $0.00/$0.00 met, co-insurance 0%. No pre-authorization required.  Patient will need to complete follow up appt. Once completed, patient will be contacted for scheduling upon review by the RN Navigator.

## 2019-01-08 ENCOUNTER — Encounter: Payer: Self-pay | Admitting: Acute Care

## 2019-01-08 ENCOUNTER — Other Ambulatory Visit: Payer: Self-pay

## 2019-01-08 ENCOUNTER — Ambulatory Visit (INDEPENDENT_AMBULATORY_CARE_PROVIDER_SITE_OTHER): Payer: Medicare Other | Admitting: Acute Care

## 2019-01-08 DIAGNOSIS — J329 Chronic sinusitis, unspecified: Secondary | ICD-10-CM

## 2019-01-08 NOTE — Patient Instructions (Signed)
It was great to talk with you today. Continue Singulair daily Continue Zyrtec daily Continue Flonase as needed.daily Follow up as needed if shortness of breath doesn't resolve with med change( Brilinta) Follow up with cardiology as is already scheduled.  Follow up as needed Please contact office for sooner follow up if symptoms do not improve or worsen or seek emergency care  I hope you feel better soon, and that you are able to get the mediation from  [place].

## 2019-01-08 NOTE — Progress Notes (Signed)
Virtual Visit via Telephone Note  I connected with [name] on 01/08/19 at  2:30 PM EDT by telephone and verified that I am speaking with the correct person using two identifiers.  Location: Patient: At Home Provider: In the office at [place], [place], [place], [address].   I discussed the limitations, risks, security and privacy concerns of performing an evaluation and management service by telephone and the availability of in person appointments. I also discussed with the patient that there may be a patient responsible charge related to this service. The patient expressed understanding and agreed to proceed.  Synopsis 85 year old female former smoker followed in our office for abnormal CT (probable Boop), emphysema  PMH: Scleroderma (managed by Dr. Estanislado Pandy), chronic sinusitis (managed by Dr. Lucia Gaskins with ENT) Smoker/ Smoking History: Former smoker.  80-pack-year smoking history. Maintenance: 5 mg prednisone Pt of: Dr. Halford Chessman   History of Present Illness: Pt. Was seen by [name], NP on 12/24/2018 via phone for a sinusitis. She was treated with Amoxicillin x 10  days.   She states she is feeling well. She does not have a low grade fever. Her secretions have cleared. She no longer has any facial or sinus pains. She has recently been in the hospital ( 12/27/2018-12/31/2018)  for chest pain which was  classic unstable angina including inferolateral ST changes suggestive of ischemia.  She had had previous left main stenting and stenting of the RCA.  She underwent diagnostic heart catheterization by Dr. Haroldine Laws 12/30/2018 and was  found to have severe two-vessel disease with critical stenosis of the distal RCA and severe stenosis of the mid LAD. Three  Overlapping drug-eluting stents were implanted in the mid/distal vessel.  The stents overlap with the previously implanted stent. She was started on Brilenta on discharge.  She has been  short of breath and lacks energy since  Brilinta  initiation.. She is being followed by cardiology, and they feel the shortness of breath is most likely secondary to the new drug start.They are in the process of trying to get patient Ticlid from [place] as she has tolerated that medication much better in the past. She denies any fever, chest pain, orthopnea or hemoptysis.  .    Observations/Objective: Cardiac Cath 12/30/2018. INDICATION: Unstable angina.  This 85 year old woman presents with classic unstable angina including inferolateral ST changes suggestive of ischemia.  She has had previous left main stenting and stenting of the RCA.  She underwent diagnostic heart catheterization by Dr. Haroldine Laws today and is found to have severe two-vessel disease with critical stenosis of the distal RCA and severe stenosis of the mid LAD.  After review of her films, I elected to proceed with PCI.  She had received 3000 units of unfractionated heparin for the diagnostic procedure.  She is given additional weight-based heparin.  She is loaded with ticagrelor 180 mg on the table.  PROCEDURAL DETAILS: There is an indwelling 5/6 French slender sheath in the right radial artery.  Once the ACT becomes therapeutic, a JR4 5 [place] guide catheter is inserted.  PCI of the RCA is performed first.  Overlapping drug-eluting stents were implanted in the mid/distal vessel.  The stents overlap with the previously implanted stent.  Attention is then turned to the LAD where a 5 [place] EBU 3.5 cm guide catheter is used.  The LAD is treated with a single DES without complication.  Please see the PCI note for details.  Catheter exchanges are performed over an exchange length  guidewire. There are no immediate procedural complications. A TR band is used for radial hemostasis at the completion of the procedure.  The patient was transferred to the post catheterization recovery area for further monitoring.  CXR 12/27/2018 FINDINGS: Monitoring leads overlie the patient. Stable cardiac and  mediastinal contours. Aortic atherosclerosis. No large area pulmonary consolidation. No pleural effusion or pneumothorax. Thoracic spine degenerative changes.  IMPRESSION: No acute cardiopulmonary process.  CT Sinus 08/29/2018 CT PARANASAL SINUS LIMITED WITHOUT CONTRAST  TECHNIQUE: Non-contiguous multidetector CT images of the paranasal sinuses were obtained in a single plane without contrast.  COMPARISON:  Head CT 07/30/2018  FINDINGS: The visualized portions of the paranasal sinuses are clear without evidence of significant mucosal thickening or fluid. Rightward nasal septal deviation and a left concha bullosa are noted. The visualized mastoid air cells are clear. Bilateral cataract extraction and cerebral atrophy are noted.  IMPRESSION: Clear sinuses.  Assessment and Plan: Resolved Sinusitis Plan Continue Singulair daily Continue Zyrtec daily Continue Flonase as needed.daily Follow up as needed  if shortness of breath doesn't resolve with discontinuation of Brilinta .   Follow Up Instructions: Follow up as needed   I discussed the assessment and treatment plan with the patient. The patient was provided an opportunity to ask questions and all were answered. The patient agreed with the plan and demonstrated an understanding of the instructions.   The patient was advised to call back or seek an in-person evaluation if the symptoms worsen or if the condition fails to improve as anticipated.  I provided 23  minutes of non-face-to-face time during this encounter.   Magdalen Spatz, NP 01/08/2019 3:00 PM

## 2019-01-22 ENCOUNTER — Other Ambulatory Visit: Payer: Self-pay

## 2019-01-22 ENCOUNTER — Other Ambulatory Visit: Payer: Medicare Other

## 2019-01-22 DIAGNOSIS — E876 Hypokalemia: Secondary | ICD-10-CM | POA: Diagnosis not present

## 2019-01-23 ENCOUNTER — Other Ambulatory Visit: Payer: Self-pay | Admitting: Pulmonary Disease

## 2019-01-23 ENCOUNTER — Telehealth: Payer: Self-pay | Admitting: Pharmacist

## 2019-01-23 LAB — BASIC METABOLIC PANEL
BUN/Creatinine Ratio: 29 — ABNORMAL HIGH (ref 12–28)
BUN: 25 mg/dL (ref 8–27)
CO2: 24 mmol/L (ref 20–29)
Calcium: 9.3 mg/dL (ref 8.7–10.3)
Chloride: 89 mmol/L — ABNORMAL LOW (ref 96–106)
Creatinine, Ser: 0.87 mg/dL (ref 0.57–1.00)
GFR calc Af Amer: 70 mL/min/{1.73_m2} (ref >59)
GFR calc non Af Amer: 61 mL/min/{1.73_m2} (ref >59)
Glucose: 223 mg/dL — ABNORMAL HIGH (ref 65–99)
Potassium: 3.8 mmol/L (ref 3.5–5.2)
Sodium: 132 mmol/L — ABNORMAL LOW (ref 134–144)

## 2019-01-23 NOTE — Telephone Encounter (Signed)
I called the expanded access line with the FDA and was sent to voicemail so I followed up with an email to [email or web address] asking about potential Ticlid availability. Will await response back.

## 2019-01-23 NOTE — Telephone Encounter (Signed)
Result Notes for Basic metabolic panel   Notes recorded by Fay Records, MD on 01/23/2019 at 1:36 PM EDT  ELectrolytes better  K is now 3.8  Keep on same meds   Asked patient about her breathing  More SOB when Brilinta started Tried caffeine   She says now it is no better  ALos has rash all over body  In remote past she had an allergic Rxn to Plavix  Tolerated Ticlid  She is not a candidate for Effient (neuro)  Will ask pharmacy if there are any options for getting Ticlid (as a compassionate use)  ------   Notes recorded by Nuala Alpha, LPN on 2/95/6213 at 08:65 AM EDT  Preliminarily reviewed. Forwarded to MD desktop for review and signature.

## 2019-01-24 ENCOUNTER — Telehealth (HOSPITAL_COMMUNITY): Payer: Self-pay | Admitting: Internal Medicine

## 2019-01-24 NOTE — Telephone Encounter (Signed)
Dr. Halford Chessman, please advise if it is okay for pt's prednisone to be refilled and if so, do you want to add any refills? Thanks!

## 2019-01-28 ENCOUNTER — Emergency Department (HOSPITAL_COMMUNITY)
Admission: EM | Admit: 2019-01-28 | Discharge: 2019-01-28 | Disposition: A | Discharge status: Home / Self Care | Payer: Medicare Other | Attending: Emergency Medicine | Admitting: Emergency Medicine

## 2019-01-28 ENCOUNTER — Emergency Department (HOSPITAL_COMMUNITY): Payer: Medicare Other

## 2019-01-28 ENCOUNTER — Telehealth: Payer: Self-pay | Admitting: Internal Medicine

## 2019-01-28 ENCOUNTER — Encounter (HOSPITAL_COMMUNITY): Payer: Self-pay | Admitting: Emergency Medicine

## 2019-01-28 ENCOUNTER — Other Ambulatory Visit: Payer: Self-pay

## 2019-01-28 ENCOUNTER — Other Ambulatory Visit: Payer: Self-pay | Admitting: Internal Medicine

## 2019-01-28 DIAGNOSIS — I252 Old myocardial infarction: Secondary | ICD-10-CM | POA: Diagnosis not present

## 2019-01-28 DIAGNOSIS — T887XXA Unspecified adverse effect of drug or medicament, initial encounter: Secondary | ICD-10-CM | POA: Diagnosis not present

## 2019-01-28 DIAGNOSIS — R231 Pallor: Secondary | ICD-10-CM | POA: Diagnosis not present

## 2019-01-28 DIAGNOSIS — E119 Type 2 diabetes mellitus without complications: Secondary | ICD-10-CM | POA: Insufficient documentation

## 2019-01-28 DIAGNOSIS — R0902 Hypoxemia: Secondary | ICD-10-CM | POA: Diagnosis not present

## 2019-01-28 DIAGNOSIS — Z20828 Contact with and (suspected) exposure to other viral communicable diseases: Secondary | ICD-10-CM | POA: Insufficient documentation

## 2019-01-28 DIAGNOSIS — Z8541 Personal history of malignant neoplasm of cervix uteri: Secondary | ICD-10-CM | POA: Diagnosis not present

## 2019-01-28 DIAGNOSIS — R61 Generalized hyperhidrosis: Secondary | ICD-10-CM | POA: Insufficient documentation

## 2019-01-28 DIAGNOSIS — R1084 Generalized abdominal pain: Secondary | ICD-10-CM | POA: Diagnosis not present

## 2019-01-28 DIAGNOSIS — I2510 Atherosclerotic heart disease of native coronary artery without angina pectoris: Secondary | ICD-10-CM | POA: Diagnosis not present

## 2019-01-28 DIAGNOSIS — Z79899 Other long term (current) drug therapy: Secondary | ICD-10-CM | POA: Insufficient documentation

## 2019-01-28 DIAGNOSIS — E039 Hypothyroidism, unspecified: Secondary | ICD-10-CM | POA: Diagnosis not present

## 2019-01-28 DIAGNOSIS — R55 Syncope and collapse: Secondary | ICD-10-CM | POA: Diagnosis not present

## 2019-01-28 DIAGNOSIS — Z794 Long term (current) use of insulin: Secondary | ICD-10-CM | POA: Diagnosis not present

## 2019-01-28 DIAGNOSIS — R110 Nausea: Secondary | ICD-10-CM | POA: Diagnosis not present

## 2019-01-28 DIAGNOSIS — R1030 Lower abdominal pain, unspecified: Secondary | ICD-10-CM | POA: Insufficient documentation

## 2019-01-28 DIAGNOSIS — R0602 Shortness of breath: Secondary | ICD-10-CM | POA: Diagnosis not present

## 2019-01-28 DIAGNOSIS — E1165 Type 2 diabetes mellitus with hyperglycemia: Secondary | ICD-10-CM | POA: Diagnosis not present

## 2019-01-28 DIAGNOSIS — T50905A Adverse effect of unspecified drugs, medicaments and biological substances, initial encounter: Secondary | ICD-10-CM | POA: Diagnosis not present

## 2019-01-28 LAB — CBC WITH DIFFERENTIAL/PLATELET
Abs Immature Granulocytes: 0.07 10*3/uL (ref 0.00–0.07)
Basophils Absolute: 0.1 10*3/uL (ref 0.0–0.1)
Basophils Relative: 1 %
Eosinophils Absolute: 0 10*3/uL (ref 0.0–0.5)
Eosinophils Relative: 0 %
HCT: 37 % (ref 36.0–46.0)
Hemoglobin: 12.4 g/dL (ref 12.0–15.0)
Immature Granulocytes: 1 %
Lymphocytes Relative: 12 %
Lymphs Abs: 1.2 10*3/uL (ref 0.7–4.0)
MCH: 27.9 pg (ref 26.0–34.0)
MCHC: 33.5 g/dL (ref 30.0–36.0)
MCV: 83.1 fL (ref 80.0–100.0)
Monocytes Absolute: 0.6 10*3/uL (ref 0.1–1.0)
Monocytes Relative: 6 %
Neutro Abs: 8.5 10*3/uL — ABNORMAL HIGH (ref 1.7–7.7)
Neutrophils Relative %: 80 %
Platelets: 224 10*3/uL (ref 150–400)
RBC: 4.45 MIL/uL (ref 3.87–5.11)
RDW: 13.6 % (ref 11.5–15.5)
WBC: 10.5 10*3/uL (ref 4.0–10.5)
nRBC: 0 % (ref 0.0–0.2)

## 2019-01-28 LAB — COMPREHENSIVE METABOLIC PANEL
ALT: 18 U/L (ref 0–44)
AST: 20 U/L (ref 15–41)
Albumin: 3.3 g/dL — ABNORMAL LOW (ref 3.5–5.0)
Alkaline Phosphatase: 57 U/L (ref 38–126)
Anion gap: 14 (ref 5–15)
BUN: 23 mg/dL (ref 8–23)
CO2: 26 mmol/L (ref 22–32)
Calcium: 8.9 mg/dL (ref 8.9–10.3)
Chloride: 91 mmol/L — ABNORMAL LOW (ref 98–111)
Creatinine, Ser: 0.93 mg/dL (ref 0.44–1.00)
GFR calc Af Amer: >60 mL/min (ref >60)
GFR calc non Af Amer: 56 mL/min — ABNORMAL LOW (ref >60)
Glucose, Bld: 329 mg/dL — ABNORMAL HIGH (ref 70–99)
Potassium: 3.7 mmol/L (ref 3.5–5.1)
Sodium: 131 mmol/L — ABNORMAL LOW (ref 135–145)
Total Bilirubin: 0.4 mg/dL (ref 0.3–1.2)
Total Protein: 6.6 g/dL (ref 6.5–8.1)

## 2019-01-28 LAB — TROPONIN I (HIGH SENSITIVITY): Troponin I (High Sensitivity): 10 ng/L (ref <18)

## 2019-01-28 LAB — SARS CORONAVIRUS 2 BY RT PCR (HOSPITAL ORDER, PERFORMED IN ~~LOC~~ HOSPITAL LAB): SARS Coronavirus 2: NEGATIVE

## 2019-01-28 LAB — BRAIN NATRIURETIC PEPTIDE: B Natriuretic Peptide: 43.7 pg/mL (ref 0.0–100.0)

## 2019-01-28 LAB — LIPASE, BLOOD: Lipase: 40 U/L (ref 11–51)

## 2019-01-28 LAB — SEDIMENTATION RATE: Sed Rate: 40 mm/hr — ABNORMAL HIGH (ref 0–22)

## 2019-01-28 MED ORDER — TICAGRELOR 90 MG PO TABS
90.0000 mg | ORAL_TABLET | Freq: Once | ORAL | Status: AC
  Administered 2019-01-28: 90 mg via ORAL
  Filled 2019-01-28: qty 1

## 2019-01-28 MED ORDER — TICAGRELOR 90 MG PO TABS: 90.0000 mg | ORAL_TABLET | Freq: Two times a day (BID) | ORAL | 3 refills | Status: DC

## 2019-01-28 NOTE — ED Provider Notes (Signed)
[facility] Provider Note   CSN: [id number] Arrival date & time: 01/28/19  1407     History   Chief Complaint Chief Complaint  Patient presents with   Shortness of Breath    HPI [name] is a 85 y.o. female.     The history is provided by the patient and medical records. No language interpreter was used.  Shortness of Breath Severity:  Moderate Onset quality:  Gradual Duration:  3 weeks (worsend last few days) Timing:  Intermittent Progression:  Waxing and waning Chronicity:  Recurrent Relieved by:  Nothing Worsened by:  Nothing Associated symptoms: abdominal pain, diaphoresis and rash (mild subjective groin rash)   Associated symptoms: no chest pain, no cough, no fever, no headaches, no neck pain, no sputum production, no syncope, no vomiting and no wheezing     Past Medical History:  Diagnosis Date   Adenomatous colon polyp    Arthritis    Blood transfusion    Cancer ([place])    cervical cancer pre hysterectomy   Cervical disc disease    Chronic sinusitis    CONSTIPATION, CHRONIC 06/26/2007   Qualifier: Diagnosis of  By: Nils Pyle CMA (AAMA), Mearl Latin     COPD (chronic obstructive pulmonary disease) ([place])    Coronary artery disease    prior MI with stenting of RCA with repeat PTCA/stenting for ISR in 01/2008, stent to LM 11/2008   CVA (cerebral infarction) 1991   Depression    Diabetes mellitus    Diverticulosis    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HCC)    Dyslipidemia    Emphysema    Fibromyalgia    GERD (gastroesophageal reflux disease)    Hypertension    Hypothyroidism    IBS (irritable bowel syndrome)    Legally blind    Macular degeneration    Myocardial infarction [facility]) may 27th 2007   Pneumonia 07/16/2013    HX PNEUMONIA   Pulmonary hypertension ([place])    Raynaud's phenomenon    Scleroderma ([place])    Sjogren's disease ([place])    Sleep apnea    STOP BANG SCORE 5   Stroke ([place]) 1990   brain stem stroke - weakness rt hand   Zenker's diverticulum     Patient Active Problem List   Diagnosis Date Noted   Chest pain 12/29/2018   Angina pectoris ([place]) 12/28/2018   CAD (coronary artery disease) 12/28/2018   Chronic sinusitis 12/28/2018   Fever and chills 12/24/2018   Allergy to multiple antibiotics 12/24/2018   GERD (gastroesophageal reflux disease) 09/11/2018   Dyspnea 09/11/2018   Grade II diastolic dysfunction 72/53/6644   Thrombocytopenia ([place]) 07/30/2018   [place] 07/17/2018   Recurrent sinus infections 07/05/2018   Jaw pain 06/17/2018   Multifocal pneumonia 06/17/2018   Abnormal ECG    SOB (shortness of breath), secondary to Boop 07/16/2017   Chest pain, moderate coronary artery risk 07/13/2017   Hypoxia 07/09/2017   Dehydration    Enteritis 11/17/2016   Nondisplaced fracture of second metatarsal bone, left foot, subsequent encounter for fracture with routine healing 06/29/2016   Arthralgia of both lower legs 03/30/2016   Hypothyroidism 10/01/2015   Sepsis ([place]) 05/16/2015   HCAP (healthcare-associated pneumonia) 05/16/2015   Diarrhea 05/02/2015   Diarrhea with dehydration 05/02/2015   Hyponatremia 05/02/2015   Weakness 01/29/2015   Pain in joint, ankle and foot 11/20/2014   Acute bronchitis 09/01/2014   CAP (community acquired pneumonia) 05/26/2014   Dysphagia, unspecified(787.20) 06/18/2013   Stricture and stenosis of  esophagus 06/18/2013   Urinary incontinence in female 05/06/2012   Fibromyalgia    Chronic confusional state 03/21/2012   Postoperative anemia 03/21/2012   Anemia of chronic disease 03/19/2012   Type 2 diabetes mellitus ([place]) 05/30/2011   COPD (chronic obstructive pulmonary disease) ([place]) 05/30/2011   Scleroderma ([place]) 05/30/2011   Colonic stricture s/p lap sigmoid colectomy 03/18/2012 03/27/2011   Diverticulosis of colon 03/27/2011   Systemic sclerosis ([place]) 06/29/2009   Pure  hypercholesterolemia 05/25/2009   BOOP (bronchiolitis obliterans with organizing pneumonia) ([place]) 02/18/2009   Chronic rhinitis 07/08/2007   EMPHYSEMA 07/08/2007   Hyperlipidemia 06/26/2007   Depression 06/26/2007   Legal blindness 06/26/2007   HTN (hypertension) 06/26/2007   Coronary atherosclerosis 06/26/2007   ZENKER'S DIVERTICULUM 06/26/2007   Chronic constipation 06/26/2007   Irritable bowel syndrome 06/26/2007    Past Surgical History:  Procedure Laterality Date   APPENDECTOMY     BALLOON DILATION N/A 07/15/2015   Procedure: BALLOON DILATION;  Surgeon: Milus Banister, MD;  Location: [facility];  Service: Endoscopy;  Laterality: N/A;   BILATERAL OOPHORECTOMY  2002   BRONCHOSCOPY  02-22-09   CARDIAC CATHETERIZATION     x 3   CATARACT EXTRACTION     CHOLECYSTECTOMY  1965   COLON SURGERY  2014   colon resection   colonectomy     CORONARY STENT INTERVENTION N/A 12/30/2018   Procedure: CORONARY STENT INTERVENTION;  Surgeon: Sherren Mocha, MD;  Location: [facility];  Service: Cardiovascular;  Laterality: N/A;   ESOPHAGOGASTRODUODENOSCOPY (EGD) WITH ESOPHAGEAL DILATION N/A 06/18/2013   Procedure: ESOPHAGOGASTRODUODENOSCOPY (EGD) WITH ESOPHAGEAL DILATION;  Surgeon: Inda Castle, MD;  Location: [facility];  Service: Endoscopy;  Laterality: N/A;   FLEXIBLE SIGMOIDOSCOPY N/A 07/15/2015   Procedure: FLEXIBLE SIGMOIDOSCOPY;  Surgeon: Milus Banister, MD;  Location: [facility];  Service: Endoscopy;  Laterality: N/A;   LEFT HEART CATH AND CORONARY ANGIOGRAPHY N/A 12/30/2018   Procedure: LEFT HEART CATH AND CORONARY ANGIOGRAPHY;  Surgeon: Jolaine Artist, MD;  Location: [facility];  Service: Cardiovascular;  Laterality: N/A;   lobe thyroid removal     PROCTOSCOPY  03/18/2012   Procedure: PROCTOSCOPY;  Surgeon: Adin Hector, MD;  Location: [facility];  Service: General;  Laterality: N/A;  rigid procto   RADIOLOGY WITH ANESTHESIA N/A  03/09/2015   Procedure: MRI CERVICAL SPINE WITHOUT AND LUMBER WITHOUT;  Surgeon: Medication Radiologist, MD;  Location: [facility];  Service: Radiology;  Laterality: N/A;   SPINAL FUSION  1997   and implantation of a plate    STENTED CARDIAC  ARTERY  2007 / 2007 / 2010   X [place]   removal of left lobe thyroid   TOTAL ABDOMINAL HYSTERECTOMY  1973     OB History   No obstetric history on file.      Home Medications    Prior to Admission medications   Medication Sig Start Date End Date Taking? Authorizing Provider  amLODipine (NORVASC) 2.5 MG tablet Take 1 tablet (2.5 mg total) by mouth daily. 10/11/18   Fay Records, MD  aspirin EC 81 MG tablet Take 1 tablet (81 mg total) by mouth daily. Patient not taking: Reported on 01/08/2019 11/18/13   Fay Records, MD  cetirizine (ZYRTEC) 10 MG tablet Take 10 mg by mouth daily as needed for allergies.    [provider]  esomeprazole (NEXIUM) 40 MG capsule Take 40 mg by mouth daily.  [provider]  ezetimibe (ZETIA) 10 MG tablet Take 1 tablet (10 mg total) by mouth daily. Patient not taking: Reported on 01/08/2019 12/31/18 12/31/19  Donne Hazel, MD  fluticasone [facility]) 50 MCG/ACT nasal spray Place 1 spray into both nostrils daily. 07/11/17   Ghimire, Henreitta Leber, MD  furosemide (LASIX) 20 MG tablet Take 1 tablet (20 mg total) by mouth daily. 09/03/18   Daune Perch, NP  HYDROcodone-acetaminophen (NORCO) 7.5-325 MG tablet Take 0.5-1 tablets by mouth every 6 (six) hours as needed for moderate pain.     [provider]  imipramine (TOFRANIL) 25 MG tablet Take 75 mg by mouth at bedtime.  05/26/09   [provider]  insulin lispro (HUMALOG KWIKPEN) 100 UNIT/ML KwikPen Inject 5 Units into the skin 3 (three) times daily.     [provider]  levothyroxine (SYNTHROID, LEVOTHROID) 100 MCG tablet Take 100 mcg by mouth daily before breakfast.    [provider]  montelukast  (SINGULAIR) 10 MG tablet Take 10 mg by mouth at bedtime.  05/10/15   [provider]  Naphazoline-Pheniramine (OPCON-A) 0.027-0.315 % SOLN Place 1-2 drops into both eyes as needed (for allergies).    [provider]  potassium chloride 20 MEQ TBCR Take 20 mEq by mouth daily. 01/03/19 01/03/20  Daune Perch, NP  predniSONE (DELTASONE) 5 MG tablet TAKE 1 TABLET ONCE A DAY WITH BREAKFAST 01/24/19   Chesley Mires, MD  ticagrelor (BRILINTA) 90 MG TABS tablet Take 1 tablet (90 mg total) by mouth 2 (two) times daily. 01/28/19   Fay Records, MD  triazolam (HALCION) 0.25 MG tablet Take 0.25 mg by mouth at bedtime.     [provider]    Family History Family History  Problem Relation Age of Onset   Other Mother 47       Died from sepsis   Stroke Father 5       died   Heart disease Father    Diabetes Father    Cancer Sister        lung   Diabetes Sister    Cancer Brother        lung   Diabetes Brother    Diabetes Brother     Social History Social History   Tobacco Use   Smoking status: Former Smoker    Packs/day: 2.00    Years: 40.00    Pack years: 80.00    Types: Cigarettes    Start date: 1949    Quit date: 1989    Years since quitting: 31.6   Smokeless tobacco: Never Used  Substance Use Topics   Alcohol use: No   Drug use: No     Allergies   Flomax [tamsulosin hcl], Peanut oil, Sulfa antibiotics, Titanium, Yellow dye, Lantus [insulin glargine], Levaquin [levofloxacin in d5w], Metoprolol-hydrochlorothiazide, Norvasc [amlodipine], Novolog [insulin aspart], Penciclovir, Senna [sennosides], Statins, Sulfonamide derivatives, Adhesive [tape], and Maxidex [dexamethasone]   Review of Systems Review of Systems  Constitutional: Positive for diaphoresis and fatigue. Negative for chills and fever.  HENT: Negative for congestion.   Respiratory: Positive for shortness of breath. Negative for cough, sputum production, chest tightness, wheezing and  stridor.   Cardiovascular: Negative for chest pain, palpitations, leg swelling and syncope.  Gastrointestinal: Positive for abdominal pain and nausea. Negative for constipation, diarrhea and vomiting.  Genitourinary: Positive for genital sores. Negative for flank pain.  Musculoskeletal: Negative for back pain, neck pain and neck stiffness.  Skin: Positive for rash (mild subjective groin rash).  Negative for wound.  Neurological: Negative for headaches.  Psychiatric/Behavioral: Negative for agitation.  All other systems reviewed and are negative.    Physical Exam Updated Vital Signs BP 130/88    Pulse 86    Temp 98.8 F (37.1 C) (Oral)    Resp 15    SpO2 97%   Physical Exam Vitals signs and nursing note reviewed.  Constitutional:      General: She is not in acute distress.    Appearance: She is well-developed. She is not ill-appearing, toxic-appearing or diaphoretic.  HENT:     Head: Normocephalic and atraumatic.     Nose: No congestion or rhinorrhea.     Mouth/Throat:     Pharynx: No oropharyngeal exudate or posterior oropharyngeal erythema.  Eyes:     Extraocular Movements: Extraocular movements intact.     Conjunctiva/sclera: Conjunctivae normal.     Pupils: Pupils are equal, round, and reactive to light.  Neck:     Musculoskeletal: Neck supple.  Cardiovascular:     Rate and Rhythm: Normal rate and regular rhythm.     Heart sounds: Murmur present. Systolic murmur present.  Pulmonary:     Effort: Pulmonary effort is normal. No respiratory distress.     Breath sounds: Normal breath sounds. No decreased breath sounds, wheezing, rhonchi or rales.  Chest:     Chest wall: No mass or tenderness.  Abdominal:     Palpations: Abdomen is soft.     Tenderness: There is no abdominal tenderness. There is no right CVA tenderness or left CVA tenderness.  Musculoskeletal:     Right lower leg: She exhibits no tenderness. No edema.     Left lower leg: She exhibits no tenderness. No edema.   Skin:    General: Skin is warm and dry.     Capillary Refill: Capillary refill takes less than 2 seconds.  Neurological:     General: No focal deficit present.     Mental Status: She is alert.  Psychiatric:        Mood and Affect: Mood normal.      ED Treatments / Results  Labs (all labs ordered are listed, but only abnormal results are displayed) Labs Reviewed  URINE CULTURE  SARS CORONAVIRUS 2 (HOSPITAL ORDER, [facility])  CBC WITH DIFFERENTIAL/PLATELET  COMPREHENSIVE METABOLIC PANEL  LIPASE, BLOOD  URINALYSIS, ROUTINE W REFLEX MICROSCOPIC  BRAIN NATRIURETIC PEPTIDE  TROPONIN I (HIGH SENSITIVITY)    EKG EKG Interpretation  Date/Time:  Tuesday January 28 2019 14:18:15 EDT Ventricular Rate:  86 PR Interval:    QRS Duration: 83 QT Interval:  359 QTC Calculation: 430 R Axis:   60 Text Interpretation:  Sinus rhythm Abnormal R-wave progression, early transition Abnormal T, consider ischemia, diffuse leads When compared to prior, possible new t wave inversion in lead AVF compared to last ECG. also more wownward t wave in lead 2.  No STEMI Confirmed by Antony Blackbird [phone]) on 01/28/2019 2:35:12 PM   Radiology Dg Chest Portable 1 View  Result Date: 01/28/2019 CLINICAL DATA:  Shortness of breath. EXAM: PORTABLE CHEST 1 VIEW COMPARISON:  December 27, 2018 FINDINGS: Cardiomediastinal silhouette is normal. Mediastinal contours appear intact. Calcific atherosclerotic disease of the aorta. There is no evidence of focal airspace consolidation, pleural effusion or pneumothorax. Osseous structures are without acute abnormality. Soft tissues are grossly normal. IMPRESSION: No active disease. Calcific atherosclerotic disease of the aorta. Electronically Signed   By: Fidela Salisbury M.D.   On: 01/28/2019 15:08  Procedures Procedures (including critical care time)  Medications Ordered in ED Medications - No data to display   Initial Impression / Assessment  and Plan / ED Course  I have reviewed the triage vital signs and the nursing notes.  Pertinent labs & imaging results that were available during my care of the patient were reviewed by me and considered in my medical decision making (see chart for details).        [name] is a 85 y.o. female with a past medical history significant for CAD with PCI x6 most recently 1 month ago, hypertension, hyperlipidemia, diabetes, prior stroke, pulmonary hypertension, scleroderma, prior sigmoid colectomy, and legal blindness who presents with shortness of breath, nausea, diaphoresis, near syncope, fatigue, and lower abdominal discomfort.  Patient reports that her symptoms of the shortness of breath, lightheadedness, fatigue, and diaphoresis feel "exact same" as her last MI.  She reports the symptoms are extremity exertional.  She reports that she does not get chest pain with her MIs.  She reports that this was her sixth stent last month and she is concerned that they may have clogged given the similar symptoms.  Patient denies fevers, chills, congestion, or cough.  She reports that she has had some burning on her legs when she urinates but denies dysuria.  She reports the lower abdominal pain is sharp and constant.  It can get very severe.  She reports that does not seem to be completely associated with the intermittent shortness of breath fatigue and diaphoresis symptoms that she is had for the last few days.  Patient reports her symptoms have improved at rest currently.  On exam, chest is nontender.  Lungs are clear.  Abdomen is nontender on palpation.  Legs have pulses in both dorsalis pedis arteries.  Symmetric radial pulses.  Patient had no focal neurologic deficits on muscle exam.  EKG shows no STEMI.  Clinically I am concerned when the patient is reporting that her symptoms are similar to her prior MI although not having chest pain.  She reports that she has never had aorta issues and she had good  perfusion and pulses in her legs.  Low suspicion for aortic cause of her abdominal pain at this time.  We will get screening labs, chest x-ray, and will likely touch base with cardiology.  With the urine burning symptoms in the lower abdominal pain, will start with urinalysis and culture.  Anticipate evaluation by cardiology team for her symptoms.  Care transferred to Dr. Gilford Raid while awaiting results of diagnostic work-up.  Anticipate touching base with cardiology for further management recommendations.  Final Clinical Impressions(s) / ED Diagnoses   Final diagnoses:  Shortness of breath  Lower abdominal pain  Diaphoresis  Exertional shortness of breath  Nausea  Near syncope     Clinical Impression: 1. Shortness of breath   2. Lower abdominal pain   3. Diaphoresis   4. Exertional shortness of breath   5. Nausea   6. Near syncope     Disposition: Care transferred to oncoming team while awaiting results of diagnostic work-up.  This note was prepared with assistance of Systems analyst. Occasional wrong-word or sound-a-like substitutions may have occurred due to the inherent limitations of voice recognition software.     Shareena Nusz, Gwenyth Allegra, MD 01/28/19 [phone]

## 2019-01-28 NOTE — ED Triage Notes (Signed)
Pt arrives via EMS from home with reports of having a BM today then having diaphoresis and SOB. States she had the same symptoms last month and had a NSTEMI. Pt on brilanta since

## 2019-01-28 NOTE — Telephone Encounter (Addendum)
Received email and phone call from [facility], PharmD with the [place], phone [phone]. She stated that we would need to find a country and company that could ship Ticlid to the [place], and that a single patient IND needs to be approved by the FDA before shipping the medication (the FDA need tracking # needs to be forwarded to drug info for their database).  Medication is usually shipped to the office, may be ok to send med to patient's home. I asked her about cost - she said this may be a barrier. Would need insurance approval which would be difficult for a medication not available in the [place] anymore.  [employer] search shows country Education administrator for Ticlid - have reached out to [employer] in the [place] (email), [employer] in [place] (Patent attorney and emailed), [facility] and [employer] in [place] (email) - will await response back.

## 2019-01-28 NOTE — ED Notes (Signed)
Unable to obtain IV access, EDP made aware. IV team consult ordered

## 2019-01-28 NOTE — Discharge Instructions (Addendum)
Drink coffee with Brilinta

## 2019-01-28 NOTE — ED Provider Notes (Signed)
Pt signed out by Dr. Gustavus Messing.  Her labs were delayed due to the fact that she was a difficult stick.  The pt's troponin was ok.  EKG is unchanged.  Pt d/w Dr. Harrington Challenger who has known pt for years.  She requested that we get a ESR to follow the trend.  The pharmacist is trying to get pt to get Ticlid which is the only other medication pt can take.  Unfortunately, we have to get it from another country and they have to go through the FDA to do that.  Pt is told to continue her Brilinta for now.  She is out, so I refilled her medication.  She can't get the medication until Thursday, so she is given a dose here.  Pt is stable for d/c.  Dr. Harrington Challenger will follow up with her in the office.   Isla Pence, MD 01/28/19 Einar Crow

## 2019-01-28 NOTE — Telephone Encounter (Signed)
Appreciate attempts to obtain Ticlid    Pt currently in ED   SOB, N/  Discussed with ED physician   Pt says it  Feels like she has in past with MI  WIll update pharmacy pts progress

## 2019-01-28 NOTE — Telephone Encounter (Signed)
REviewed hx with ED physician

## 2019-01-28 NOTE — Telephone Encounter (Signed)
New Message:     Daughter wanted Dr Alfonso Patten oss to know pt just left by ambulance on her way to [facility]. She was SOB, and nausea.

## 2019-01-30 NOTE — Telephone Encounter (Signed)
Have received responses back from [employer] in [place] and [place] [facility] in [place] - no Ticlid available. Will await responses from [employer] in [place] and [employer] in [place].

## 2019-02-10 DIAGNOSIS — I2510 Atherosclerotic heart disease of native coronary artery without angina pectoris: Secondary | ICD-10-CM | POA: Diagnosis not present

## 2019-02-10 DIAGNOSIS — E782 Mixed hyperlipidemia: Secondary | ICD-10-CM | POA: Diagnosis not present

## 2019-02-10 DIAGNOSIS — I10 Essential (primary) hypertension: Secondary | ICD-10-CM | POA: Diagnosis not present

## 2019-02-14 NOTE — Progress Notes (Signed)
Reviewed and agree with assessment/plan.   Azavion Bouillon, MD [facility] 06/21/2016, 12:24 PM Pager:  [phone]  

## 2019-02-18 NOTE — Telephone Encounter (Signed)
[employer] in [place] does not have Ticlid - they stated [employer] in [place] does have Ticlid, however the [employer] website automatically links with [employer], who previously stated they do not have Ticlid. Will try following up again with [employer] in the [place] as we have not heard back from them yet.

## 2019-02-20 DIAGNOSIS — H353233 Exudative age-related macular degeneration, bilateral, with inactive scar: Secondary | ICD-10-CM | POA: Diagnosis not present

## 2019-02-24 DIAGNOSIS — Z23 Encounter for immunization: Secondary | ICD-10-CM | POA: Diagnosis not present

## 2019-02-24 DIAGNOSIS — E782 Mixed hyperlipidemia: Secondary | ICD-10-CM | POA: Diagnosis not present

## 2019-02-24 DIAGNOSIS — Z7189 Other specified counseling: Secondary | ICD-10-CM | POA: Diagnosis not present

## 2019-02-24 DIAGNOSIS — E1065 Type 1 diabetes mellitus with hyperglycemia: Secondary | ICD-10-CM | POA: Diagnosis not present

## 2019-02-24 DIAGNOSIS — I10 Essential (primary) hypertension: Secondary | ICD-10-CM | POA: Diagnosis not present

## 2019-02-24 DIAGNOSIS — I2510 Atherosclerotic heart disease of native coronary artery without angina pectoris: Secondary | ICD-10-CM | POA: Diagnosis not present

## 2019-02-25 NOTE — Telephone Encounter (Signed)
Received message back from [employer] in the [place] that they do not manufacture Ticlid either. Unfortunately have not been able to locate supply of Ticlid despite contacting 6 manufacturers across [place], the [place], and [place].

## 2019-02-28 DIAGNOSIS — I10 Essential (primary) hypertension: Secondary | ICD-10-CM | POA: Diagnosis not present

## 2019-02-28 DIAGNOSIS — E039 Hypothyroidism, unspecified: Secondary | ICD-10-CM | POA: Diagnosis not present

## 2019-02-28 DIAGNOSIS — T7840XA Allergy, unspecified, initial encounter: Secondary | ICD-10-CM | POA: Diagnosis not present

## 2019-02-28 DIAGNOSIS — E1065 Type 1 diabetes mellitus with hyperglycemia: Secondary | ICD-10-CM | POA: Diagnosis not present

## 2019-02-28 DIAGNOSIS — E782 Mixed hyperlipidemia: Secondary | ICD-10-CM | POA: Diagnosis not present

## 2019-02-28 DIAGNOSIS — E109 Type 1 diabetes mellitus without complications: Secondary | ICD-10-CM | POA: Diagnosis not present

## 2019-03-06 ENCOUNTER — Telehealth: Payer: Self-pay | Admitting: Internal Medicine

## 2019-03-06 NOTE — Telephone Encounter (Signed)
Has been nauseaus and lost 6 pounds since starting Brilinata. No appetite. Taking fluids like normal.  Now A1C is 9.  It has been over 7.  Asked about the prednisone.  She has taken prednisone for years for lung function.  Still SOB, but biggest worry is her A1C.  Said Dr. Chalmers Cater "chewed me out because she thinks I'm eating sugar every day"  Pt states I know my body and I can't believe how this medicine makes me feel.  Aware I will route to PharmD and Dr. Harrington Challenger to review potential for A1C to be elevated on Brilinta.  Will call her back with any new information.  Pt appreciative for the asisstance.

## 2019-03-06 NOTE — Telephone Encounter (Signed)
New message   Pt c/o medication issue:  1. Name of Medication:   ticagrelor (BRILINTA) 90 MG TABS tablet     2. How are you currently taking this medication (dosage and times per day)? Twice daily  3. Are you having a reaction (difficulty breathing--STAT)? No   4. What is your medication issue? Patient wants to know if this medication is causing her a1c to increase. The patient states that she is also nauseated and has loss of appetite. Please call to discuss.

## 2019-03-06 NOTE — Telephone Encounter (Signed)
Nausea could be from the Brilinta (4% of people).  Brilinta would not cause an falsely elevated A1C Iron deficiency anemia or vit b12 deficiency can cause falsely elevated A1C but her last CBC in August looked good.  Could be from long term use of prednisone

## 2019-03-10 ENCOUNTER — Ambulatory Visit: Payer: Medicare Other | Admitting: Family

## 2019-03-10 NOTE — Telephone Encounter (Signed)
Brilinta is not effecting A1C   Important to continue Left VM

## 2019-03-11 ENCOUNTER — Telehealth: Payer: Self-pay | Admitting: Internal Medicine

## 2019-03-11 NOTE — Telephone Encounter (Signed)
I spoke with cardiac rehab department and pt will need cardiology office visit and approval from cardiologist before starting rehab. I spoke with pt and gave her this information. She will discuss with Dr. Harrington Challenger at appointment on 03/17/19

## 2019-03-11 NOTE — Telephone Encounter (Signed)
New Message:     Pt wants to know if they have started back having Cardiac Rehab at [facility]? If so, she would like to start back.

## 2019-03-15 NOTE — Progress Notes (Signed)
Cardiology Office Note   Date:  03/17/2019   ID:  Renee, [name] [birth year], MRN [id number]  PCP:  Merrilee Seashore, MD  Cardiologist:   Dorris Carnes, MD   F/U of CAD    History of Present Illness: [name] is a 85 y.o. female with a history of CAD (s/p IWMI with stent to RCA;  Repeat stent to RCA for instent restenosis; s/p DES to [facility]; cath 2011:  40% LAD; 50% Lcx ; 80% small OM1: RCA 20%)   She was admitted in July with CP   Trop minimally elevated   Cath showed:  LM stent patent; LCx 70% ostial stensosis; OM1 90% ostial stenosis; MLCx with subtotal occlusion; LAD 90% mid; 95/5 mid/distal RCA.   The pt underwent PTCA/DES x 2 to mid LAD and mid RCA  Also a history of BOOP and hyperlipdemia  She is legally blind     SInce seen she has had difficulty with SOB   THought possibly due to Brilinta.  Unfortunately she is allergic to Plavix (Hx diffuse rash), not a candidate for Effient (hx CVA)   Ticlid is not avaailable anywhere)  SHe denies CP Also complains of sugars being much higher   Eating less.    Current Meds  Medication Sig  . amLODipine (NORVASC) 2.5 MG tablet Take 1 tablet (2.5 mg total) by mouth daily.  [name] aspirin EC 81 MG tablet Take 1 tablet (81 mg total) by mouth daily.  . cetirizine (ZYRTEC) 10 MG tablet Take 10 mg by mouth daily as needed for allergies.  [name] esomeprazole (NEXIUM) 40 MG capsule Take 40 mg by mouth daily.   . fluticasone (FLONASE) 50 MCG/ACT nasal spray Place 1 spray into both nostrils daily.  . furosemide (LASIX) 20 MG tablet Take 1 tablet (20 mg total) by mouth daily.  [name] HYDROcodone-acetaminophen (NORCO) 7.5-325 MG tablet Take 0.5-1 tablets by mouth every 6 (six) hours as needed for moderate pain.   [name] imipramine (TOFRANIL) 25 MG tablet Take 75 mg by mouth at bedtime.   . insulin lispro (HUMALOG KWIKPEN) 100 UNIT/ML KwikPen Inject 5 Units into the skin 3 (three) times daily.   [name] levothyroxine (SYNTHROID, LEVOTHROID) 100 MCG tablet Take 100  mcg by mouth daily before breakfast.  . montelukast (SINGULAIR) 10 MG tablet Take 10 mg by mouth at bedtime.   . Naphazoline-Pheniramine (OPCON-A) 0.027-0.315 % SOLN Place 1-2 drops into both eyes as needed (for allergies).  . potassium chloride 20 MEQ TBCR Take 20 mEq by mouth daily.  . predniSONE (DELTASONE) 5 MG tablet TAKE 1 TABLET ONCE A DAY WITH BREAKFAST  . ticagrelor (BRILINTA) 90 MG TABS tablet Take 1 tablet (90 mg total) by mouth 2 (two) times daily.  . triazolam (HALCION) 0.25 MG tablet Take 0.25 mg by mouth at bedtime.      Allergies:   Flomax [tamsulosin hcl], Peanut oil, Sulfa antibiotics, Titanium, Yellow dye, Lantus [insulin glargine], Levaquin [levofloxacin in d5w], Metoprolol-hydrochlorothiazide, Norvasc [amlodipine], Novolog [insulin aspart], Penciclovir, Senna [sennosides], Statins, Sulfonamide derivatives, Adhesive [tape], and Maxidex [dexamethasone]   Past Medical History:  Diagnosis Date  . Adenomatous colon polyp   . Arthritis   . Blood transfusion   . Cancer [facility])    cervical cancer pre hysterectomy  . Cervical disc disease   . Chronic sinusitis   . CONSTIPATION, CHRONIC 06/26/2007   Qualifier: Diagnosis of  By: Nils Pyle CMA ([place]), Mearl Latin    . COPD (chronic obstructive pulmonary disease) ([place])   .  Coronary artery disease    prior MI with stenting of RCA with repeat PTCA/stenting for ISR in 01/2008, stent to LM 11/2008  . CVA (cerebral infarction) 1991  . Depression   . Diabetes mellitus   . Diverticulosis   . 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 Dyslipidemia   . Emphysema   . Fibromyalgia   . GERD (gastroesophageal reflux disease)   . Hypertension   . Hypothyroidism   . IBS (irritable bowel syndrome)   . Legally blind   . Macular degeneration   . Myocardial infarction [facility]) may 27th 2007  . Pneumonia 07/16/2013    HX PNEUMONIA  . Pulmonary hypertension ([place])   . Raynaud's phenomenon   . Scleroderma ([place])   . Sjogren's disease ([place])   . Sleep apnea     STOP BANG SCORE 5  . Stroke [facility]) 1990   brain stem stroke - weakness rt hand  . Zenker's diverticulum     Past Surgical History:  Procedure Laterality Date  . APPENDECTOMY    . BALLOON DILATION N/A 07/15/2015   Procedure: BALLOON DILATION;  Surgeon: Milus Banister, MD;  Location: [facility];  Service: Endoscopy;  Laterality: N/A;  . BILATERAL OOPHORECTOMY  2002  . BRONCHOSCOPY  02-22-09  . CARDIAC CATHETERIZATION     x 3  . CATARACT EXTRACTION    . CHOLECYSTECTOMY  1965  . COLON SURGERY  2014   colon resection  . colonectomy    . CORONARY STENT INTERVENTION N/A 12/30/2018   Procedure: CORONARY STENT INTERVENTION;  Surgeon: Sherren Mocha, MD;  Location: [facility];  Service: Cardiovascular;  Laterality: N/A;  . ESOPHAGOGASTRODUODENOSCOPY (EGD) WITH ESOPHAGEAL DILATION N/A 06/18/2013   Procedure: ESOPHAGOGASTRODUODENOSCOPY (EGD) WITH ESOPHAGEAL DILATION;  Surgeon: Inda Castle, MD;  Location: [facility];  Service: Endoscopy;  Laterality: N/A;  . FLEXIBLE SIGMOIDOSCOPY N/A 07/15/2015   Procedure: FLEXIBLE SIGMOIDOSCOPY;  Surgeon: Milus Banister, MD;  Location: [facility];  Service: Endoscopy;  Laterality: N/A;  . LEFT HEART CATH AND CORONARY ANGIOGRAPHY N/A 12/30/2018   Procedure: LEFT HEART CATH AND CORONARY ANGIOGRAPHY;  Surgeon: Jolaine Artist, MD;  Location: [facility];  Service: Cardiovascular;  Laterality: N/A;  . lobe thyroid removal    . PROCTOSCOPY  03/18/2012   Procedure: PROCTOSCOPY;  Surgeon: Adin Hector, MD;  Location: [facility];  Service: General;  Laterality: N/A;  rigid procto  . RADIOLOGY WITH ANESTHESIA N/A 03/09/2015   Procedure: MRI CERVICAL SPINE WITHOUT AND LUMBER WITHOUT;  Surgeon: Medication Radiologist, MD;  Location: [facility];  Service: Radiology;  Laterality: N/A;  . [place]   and implantation of a plate   . STENTED CARDIAC  ARTERY  2007 / 2007 / 2010   X 3 STENT  . THYROID LOBECTOMY  1991   removal of left lobe thyroid   . TOTAL ABDOMINAL HYSTERECTOMY  1973     Social History:  The patient  reports that she quit smoking about 31 years ago. Her smoking use included cigarettes. She started smoking about 71 years ago. She has a 80.00 pack-year smoking history. She has never used smokeless tobacco. She reports that she does not drink alcohol or use drugs.   Family History:  The patient's family history includes Cancer in her brother and sister; Diabetes in her brother, brother, father, and sister; Heart disease in her father; Other (age of onset: 63) in her mother; Stroke (age of onset: 80) in her father.  ROS:  Please see the history of present illness. All other systems are reviewed and  Negative to the above problem except as noted.    PHYSICAL EXAM: VS:  BP (!) 158/64   Pulse 96   Ht 5\' 2"  (1.575 m)   Wt 130 lb 12.8 oz (59.3 kg)   SpO2 98%   BMI 23.92 kg/m   GEN: Well nourished, well developed, in no acute distress  HEENT: normal  Neck:  JVD is normal    Cardiac: RRR; no murmurs, rubs, or gallops,No signif edema  Respiratory:  clear to auscultation bilaterally GI: soft, nontender, nondistended, + BS  No hepatomegaly  MS: no deformity Moving all extremities   Skin: warm and dry, no rash Neuro:  Strength and sensation are intact Psych: euthymic mood, full affect   EKG:  EKG is not ordered today.    Lipid Panel    Component Value Date/Time   CHOL 198 12/30/2018 0943   TRIG 139 12/30/2018 0943   HDL 51 12/30/2018 0943   CHOLHDL 3.9 12/30/2018 0943   VLDL 28 12/30/2018 0943   LDLCALC 119 (H) 12/30/2018 0943   LDLDIRECT 127.6 05/15/2013 1422      Wt Readings from Last 3 Encounters:  03/17/19 130 lb 12.8 oz (59.3 kg)  01/02/19 131 lb 1.9 oz (59.5 kg)  12/31/18 133 lb 1.6 oz (60.4 kg)      ASSESSMENT AND PLAN:  1 CAD REcent cath in JU[place] with significant progression of CAD   Now s/p intervention to LAD and RCA    Unforturnately the pt complains of SOB  Is drinking caffeinated  drinks   Not helping      I have no other explanation   She is very sensitive to meds  Will review with Ezzie Dural   She is a little more that 2 months from intervention.    2  HTN    BP is mildly increased   Follow   Was lower before   3  HL  Intolerant to statins  Will review  Again, pt very sensitive to meds   4  BOOP  Follows in pulmonary  Moving air well   Steroids may be cause glu elevation     Current medicines are reviewed at length with the patient today.  The patient does not have concerns regarding medicines.  Signed, Dorris Carnes, MD  03/17/2019 4:23 PM    [address], [place], [place]  [address] Phone: [phone]; Fax: [phone]

## 2019-03-17 ENCOUNTER — Other Ambulatory Visit: Payer: Self-pay

## 2019-03-17 ENCOUNTER — Encounter: Payer: Self-pay | Admitting: Internal Medicine

## 2019-03-17 ENCOUNTER — Ambulatory Visit (INDEPENDENT_AMBULATORY_CARE_PROVIDER_SITE_OTHER): Payer: Medicare Other | Admitting: Internal Medicine

## 2019-03-17 VITALS — BP 158/64 | HR 96 | Ht 62.0 in | Wt 130.8 lb

## 2019-03-17 DIAGNOSIS — I272 Pulmonary hypertension, unspecified: Secondary | ICD-10-CM | POA: Diagnosis not present

## 2019-03-17 DIAGNOSIS — R0602 Shortness of breath: Secondary | ICD-10-CM

## 2019-03-17 DIAGNOSIS — I200 Unstable angina: Secondary | ICD-10-CM | POA: Diagnosis not present

## 2019-03-17 NOTE — Patient Instructions (Signed)
Medication Instructions:  No changes If you need a refill on your cardiac medications before your next appointment, please call your pharmacy.   Lab work: None today If you have labs (blood work) drawn today and your tests are completely normal, you will receive your results only by: . MyChart Message (if you have MyChart) OR . A paper copy in the mail If you have any lab test that is abnormal or we need to change your treatment, we will call you to review the results.  Testing/Procedures: none  Follow-Up: At [facility], you and your health needs are our priority.  As part of our continuing mission to provide you with exceptional heart care, we have created designated Provider Care Teams.  These Care Teams include your primary Cardiologist (physician) and Advanced Practice Providers (APPs -  Physician Assistants and Nurse Practitioners) who all work together to provide you with the care you need, when you need it. You will need a follow up appointment in:  4 months.  Please call our office 2 months in advance to schedule this appointment.  You may see Paula Ross, MD or one of the following Advanced Practice Providers on your designated Care Team: Scott Weaver, PA-C Vin Bhagat, PA-C . Janine Hammond, NP  Any Other Special Instructions Will Be Listed Below (If Applicable).    

## 2019-03-26 ENCOUNTER — Ambulatory Visit (INDEPENDENT_AMBULATORY_CARE_PROVIDER_SITE_OTHER): Payer: Medicare Other | Admitting: Pulmonary Disease

## 2019-03-26 ENCOUNTER — Encounter: Payer: Self-pay | Admitting: Pulmonary Disease

## 2019-03-26 ENCOUNTER — Other Ambulatory Visit: Payer: Self-pay

## 2019-03-26 VITALS — BP 136/64 | HR 89 | Temp 97.2°F | Ht 62.0 in | Wt 130.8 lb

## 2019-03-26 DIAGNOSIS — M349 Systemic sclerosis, unspecified: Secondary | ICD-10-CM | POA: Diagnosis not present

## 2019-03-26 DIAGNOSIS — I200 Unstable angina: Secondary | ICD-10-CM

## 2019-03-26 DIAGNOSIS — J438 Other emphysema: Secondary | ICD-10-CM | POA: Diagnosis not present

## 2019-03-26 DIAGNOSIS — R0609 Other forms of dyspnea: Secondary | ICD-10-CM | POA: Diagnosis not present

## 2019-03-26 DIAGNOSIS — J8489 Other specified interstitial pulmonary diseases: Secondary | ICD-10-CM | POA: Diagnosis not present

## 2019-03-26 MED ORDER — PREDNISONE 10 MG PO TABS: 20.0000 mg | ORAL_TABLET | Freq: Every day | ORAL | 0 refills | Status: DC

## 2019-03-26 NOTE — Patient Instructions (Signed)
Prednisone 20 mg daily  Follow up in 2 weeks

## 2019-03-26 NOTE — Progress Notes (Signed)
[facility]  Chief Complaint  Patient presents with  . Consult    Pulmonary Hypertension. Since starting Brilinta has been getting short of breath even when not exerting herself. Stated it started right after taking it.    Constitutional:  BP 136/64 (BP Location: Left Arm, Patient Position: Sitting, Cuff Size: Normal)   Pulse 89   Temp (!) 97.2 F (36.2 C)   Ht 5\' 2"  (1.575 m)   Wt 130 lb 12.8 oz (59.3 kg)   SpO2 96% Comment: on room air  BMI 23.92 kg/m   Past Medical History:  Zenker's diverticulum, CVA, Sjogren's disease, Scleroderma, Raynaud's phenomenon, PNA, Macular degeneration, CAD, IBS, Hypothyroidism, HTN, GERD, HLD, Dressler's syndrome, Diverticulosis, DM, Depression, Recurrent sinusitis, Cervical cancer  Brief Summary:  [name] is a 85 y.o. 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  She had heart catheterization and stenting done in July.  Initially felt this helped her breathing and energy level.  She was started on brilinta.  Since then she feels her breathing has gotten much worse and has trouble do activity.  She was not able to tolerate any other alternative to brilinta.  CXR (reviewed by me) from 01/28/19 showed no active disease process in lungs.  She remains on 5 mg prednisone.  Has seen rheumatology recently.  Not having cough, wheeze, sputum, fever, sweats, or chest pain.   Physical Exam:   Appearance - well kempt, legally blind  ENMT - clear nasal mucosa, midline nasal  septum, no oral exudates, no LAN, trachea midline  Respiratory - normal chest wall, normal respiratory effort, no accessory muscle use, no wheeze/rales  CV - s1s2 regular rate and rhythm, no murmurs, no peripheral edema, radial pulses symmetric  GI - soft, non tender, no masses  Lymph - no adenopathy noted in neck and axillary areas  MSK - normal  gait  Ext - no cyanosis, clubbing, or joint inflammation noted  Skin - no rashes, lesions, or ulcers  Neuro - normal strength, oriented x 3  Psych - normal mood and affect  Discussion:  She has dyspnea and fatigue since starting brilinta.  Her recent chest xray was normal.  She also had Echo recently that was unremarkable.  So it would seem less likely that she has developed pulmonary hypertension as a cause of her symptoms.  She does have changes of emphysema on previous imaging studies.  She also has recurrent episodes of BOOP in setting of CTD.  Assessment/Plan:   BOOP in setting of connective tissue disease. - will have her use prednisone 20 daily for next couple of weeks - if no improvement in symptoms then taper back to 5 mg daily prednisone  Recurrent sinus infections with acute sinusitis. - stable at present - she has been seen by Dr. Lucia Gaskins with ENT previously  Hx of tobacco abuse with emphysema. - she has not noticed benefit from previous trials of inhaler therapy - if her symptoms persist after trial of steroids, then might be reasonable to give another trial of inhaler therapy  Coronary artery disease s/p stenting. - concern that brilinta is causing her current symptoms, but doesn't seem like there is a reasonable alternative  Hx of connective tissue disease. - might need referral to rheumatology again   Patient Instructions  Prednisone 20 mg daily  Follow up in 2 weeks    Chesley Mires, MD [place]  Pager: [phone] 03/26/2019, 11:00 AM  Flow Sheet     Pulmonary tests:  August 2010 >> Bronchoscopy PFT 06/16/09 >> FEV1 1.99(118%), FEV1% 73, TLC 4.60(105%), DLCO 66%, no BD CT chest 11/24/11 >> centrilobular emphysema, RUL ASD CT sinus 07/30/13 >> opacification of Lt maxillary sinus, rightward NSD CT sinus 10/05/15 >> b/l mucosal edema paranasal sinuses, obstruction of ostium Rt maxillary sinus CT angio chest 07/09/17 >> GGO RUL   Cardiac tests:  Echo 12/28/18 >> EF 60 to 65%, mild LVH, normal RV systolic fx LHC [id number] >> severe 3 vessel CAD  Medications:   Allergies as of 03/26/2019      Reactions   Flomax [tamsulosin Hcl] Other (See Comments)   Made her 'trachea feel tight and couldn't swallow'   Peanut Oil Anaphylaxis, Swelling, Rash, Other (See Comments)   Throat tightens, also   Sulfa Antibiotics Anaphylaxis      Titanium Other (See Comments)   Neck wouldn't heal with titanium cervical plate   Yellow Dye Other (See Comments)   "Bee stings" all over body when eating/taking medications with red or yellow dye    Lantus [insulin Glargine] Other (See Comments)   Patient states "sulfate binder causes sinus infection/pneumonia".   Levaquin [levofloxacin In D5w] Other (See Comments)   Muscle aches   Metoprolol-hydrochlorothiazide Other (See Comments)   Decreased B/P   Norvasc [amlodipine] Swelling, Other (See Comments)   BIL Ankle edema   Novolog [insulin Aspart]    Patient states "sulfate binder causes sinus infection/pneumonia". Tolerates Humalog.   Penciclovir Other (See Comments)   Muscle Pain   Senna [sennosides] Other (See Comments)   Pt states that it causes a "stinging" sensation   Statins Other (See Comments)   All of them make me "feel badly"   Sulfonamide Derivatives Hives   Adhesive [tape] Hives, Rash   Maxidex [dexamethasone] Anxiety, Other (See Comments)   Insomnia and dizziness, too      Medication List       Accurate as of March 26, 2019 11:00 AM. If you have any questions, ask your nurse or doctor.        amLODipine 2.5 MG tablet Commonly known as: NORVASC Take 1 tablet (2.5 mg total) by mouth daily.   aspirin EC 81 MG tablet Take 1 tablet (81 mg total) by mouth daily.   cetirizine 10 MG tablet Commonly known as: ZYRTEC Take 10 mg by mouth daily as needed for allergies.   esomeprazole 40 MG capsule Commonly known as: NEXIUM Take 40 mg by mouth daily.   ezetimibe  10 MG tablet Commonly known as: Zetia Take 1 tablet (10 mg total) by mouth daily.   fluticasone 50 MCG/ACT nasal spray Commonly known as: FLONASE Place 1 spray into both nostrils daily.   furosemide 20 MG tablet Commonly known as: LASIX Take 1 tablet (20 mg total) by mouth daily.   HumaLOG KwikPen 100 UNIT/ML KwikPen Generic drug: insulin lispro Inject 5 Units into the skin 3 (three) times daily.   HYDROcodone-acetaminophen 7.5-325 MG tablet Commonly known as: NORCO Take 0.5-1 tablets by mouth every 6 (six) hours as needed for moderate pain.   imipramine 25 MG tablet Commonly known as: TOFRANIL Take 75 mg by mouth at bedtime.   levothyroxine 100 MCG tablet Commonly known as: SYNTHROID Take 100 mcg by mouth daily before breakfast.   montelukast 10 MG tablet Commonly known as: SINGULAIR Take 10 mg by mouth at bedtime.   Opcon-A 0.027-0.315 % Soln Generic drug: Naphazoline-Pheniramine Place 1-2  drops into both eyes as needed (for allergies).   Potassium Chloride ER 20 MEQ Tbcr Take 20 mEq by mouth daily.   predniSONE 5 MG tablet Commonly known as: DELTASONE TAKE 1 TABLET ONCE A DAY WITH BREAKFAST   ticagrelor 90 MG Tabs tablet Commonly known as: BRILINTA Take 1 tablet (90 mg total) by mouth 2 (two) times daily.   triazolam 0.25 MG tablet Commonly known as: HALCION Take 0.25 mg by mouth at bedtime.       Past Surgical History:  She  has a past surgical history that includes Total abdominal hysterectomy (1973); Bilateral oophorectomy (2002); Spinal fusion (1997); lobe thyroid removal; Thyroid lobectomy (1991); Cholecystectomy (1965); Appendectomy; Cataract extraction; Bronchoscopy (02-22-09); STENTED CARDIAC  ARTERY (2007 / 2007 / 2010); Proctoscopy (03/18/2012); Colon surgery (2014); Esophagogastroduodenoscopy (egd) with esophageal dilation (N/A, 06/18/2013); Cardiac catheterization; colonectomy; Radiology with anesthesia (N/A, 03/09/2015); Flexible sigmoidoscopy (N/A,  07/15/2015); Balloon dilation (N/A, 07/15/2015); LEFT HEART CATH AND CORONARY ANGIOGRAPHY (N/A, 12/30/2018); and CORONARY STENT INTERVENTION (N/A, 12/30/2018).  Family History:  Her family history includes Cancer in her brother and sister; Diabetes in her brother, brother, father, and sister; Heart disease in her father; Other (age of onset: 57) in her mother; Stroke (age of onset: 58) in her father.  Social History:  She  reports that she quit smoking about 31 years ago. Her smoking use included cigarettes. She started smoking about 71 years ago. She has a 80.00 pack-year smoking history. She has never used smokeless tobacco. She reports that she does not drink alcohol or use drugs.

## 2019-04-02 DIAGNOSIS — M5412 Radiculopathy, cervical region: Secondary | ICD-10-CM | POA: Diagnosis not present

## 2019-04-02 DIAGNOSIS — M545 Low back pain: Secondary | ICD-10-CM | POA: Diagnosis not present

## 2019-04-02 DIAGNOSIS — M542 Cervicalgia: Secondary | ICD-10-CM | POA: Diagnosis not present

## 2019-04-08 ENCOUNTER — Telehealth: Payer: Self-pay | Admitting: Internal Medicine

## 2019-04-08 NOTE — Telephone Encounter (Signed)
Pt c/o medication issue:  1. Name of Medication: ticagrelor (BRILINTA) 90 MG TABS tablet  2. How are you currently taking this medication (dosage and times per day)? Take 1 tablet (90 mg total) by mouth 2 (two) times daily.  3. Are you having a reaction (difficulty breathing--STAT)?   4. What is your medication issue? Patient states, she can't walk, can't do anything, it's affecting her life. She doesn't have one anymore.

## 2019-04-08 NOTE — Telephone Encounter (Signed)
Returned patient call. She said shes depressed.   Cant do anything, cant do laundry, cant get across the room, couldn't get out of chair the other day.  Weakness, fatigue.  Can't breathe.  Lung doctor gave her prednisone "the other week to try to help me through this".  It has not made a difference.  She doesn't care if she takes Brilintaor not.  Can't take care of self anymore, started long term care insurance so that she can get help inside the home.  Hasn't missed any doses.   Took Plavix "years ago after my first stent, all I had was a rash".  Pt reports she would be interested in discussing switching to Plavix and "deal with the rash". Discussed how allergies can change over time as our bodies change and reaction could potentially worse.  I asked if she has discussed other possible causes of her symptoms with her PCP.  She feels sure it is Brilinita.  "everybody thinks its the Brilinta".  Aware I am forwarding to Dr. Harrington Challenger to review and if she has new recommendations we will call her back.  Aware Dr. Harrington Challenger is out of office and it may be a few days before she hears back.  She's appreciative for Dr. Harrington Challenger trying her best to help her.

## 2019-04-11 NOTE — Telephone Encounter (Signed)
Tried to call pt Spoke with interventional service   Pt is now just over 3 months from placement of stents If went to aspirin alone there is a very small increased risk for event with stent thrombosis.  Acceptable if she cannot tolerate Brilinta.    Recom: 81 mg per day

## 2019-04-14 NOTE — Telephone Encounter (Signed)
I explained that to patient that Dr. Harrington Challenger has determined that there is a small increased risk for event with stent thrombois and this is acceptable if she cannot tolerate Brilinta.  Patient states she will keep taking Brilinta because it's better than being dead.  She always does what Dr. Harrington Challenger tells her. She was just hoping she could get off of it.  I reminded her of what she said last week, how she voiced that she was depressed.  Pt states she would continue the medication until Dr. Harrington Challenger tells her she can stop it safely.

## 2019-04-16 ENCOUNTER — Encounter: Payer: Self-pay | Admitting: Pulmonary Disease

## 2019-04-16 ENCOUNTER — Ambulatory Visit (INDEPENDENT_AMBULATORY_CARE_PROVIDER_SITE_OTHER): Payer: Medicare Other | Admitting: Pulmonary Disease

## 2019-04-16 ENCOUNTER — Other Ambulatory Visit: Payer: Self-pay

## 2019-04-16 VITALS — BP 132/68 | HR 87 | Temp 98.3°F | Ht 62.0 in | Wt 128.6 lb

## 2019-04-16 DIAGNOSIS — R0609 Other forms of dyspnea: Secondary | ICD-10-CM

## 2019-04-16 DIAGNOSIS — R0600 Dyspnea, unspecified: Secondary | ICD-10-CM | POA: Diagnosis not present

## 2019-04-16 DIAGNOSIS — J329 Chronic sinusitis, unspecified: Secondary | ICD-10-CM | POA: Diagnosis not present

## 2019-04-16 DIAGNOSIS — J8489 Other specified interstitial pulmonary diseases: Secondary | ICD-10-CM | POA: Diagnosis not present

## 2019-04-16 DIAGNOSIS — J438 Other emphysema: Secondary | ICD-10-CM | POA: Diagnosis not present

## 2019-04-16 DIAGNOSIS — M349 Systemic sclerosis, unspecified: Secondary | ICD-10-CM

## 2019-04-16 DIAGNOSIS — I200 Unstable angina: Secondary | ICD-10-CM

## 2019-04-16 MED ORDER — AMOXICILLIN-POT CLAVULANATE 875-125 MG PO TABS: 1.0000 | ORAL_TABLET | Freq: Two times a day (BID) | ORAL | 0 refills | Status: DC

## 2019-04-16 MED ORDER — INCRUSE ELLIPTA 62.5 MCG/INH IN AEPB: 1.0000 | INHALATION_SPRAY | Freq: Every day | RESPIRATORY_TRACT | 5 refills | Status: AC

## 2019-04-16 NOTE — Patient Instructions (Signed)
Augmentin one pill twice per day for 7 days  Incruse one puff daily  Follow up in 4 weeks, and this can be a tele visit if you prefer

## 2019-04-16 NOTE — Progress Notes (Signed)
Pulmonary, Critical Care, and Sleep Medicine  Chief Complaint  Patient presents with  . Dyspnea on exertion    Feels breathing is worse since she started taking Brilinta. Spoke with cardiology who told her she has to remain on medication.    Constitutional:  BP 132/68 (BP Location: Right Arm, Patient Position: Sitting, Cuff Size: Normal)   Pulse 87   Temp 98.3 F (36.8 C)   Ht 5\' 2"  (1.575 m)   Wt 128 lb 9.6 oz (58.3 kg)   SpO2 100% Comment: on room air  BMI 23.52 kg/m   Past Medical History:  [name]'s diverticulum, CVA, Sjogren's disease, Scleroderma, Raynaud's phenomenon, PNA, Macular degeneration, CAD, IBS, Hypothyroidism, HTN, GERD, HLD, Dressler's syndrome, Diverticulosis, DM, Depression, Recurrent sinusitis, Cervical cancer  Brief Summary:  [name] is a 85 y.o. 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  Not improvement after course of prednisone at higher dose.  Back to baseline dose of 5 mg daily.  Was advised by cardiology that she has to stay on brilinta.  Has been drinking coffee more and feels caffeine has helped her breathing some.  Has sinus pressure and drainage again.  Feels she has another sinus infection brewing.  Not having fever, chest pain, wheeze, or hemoptysis.   Physical Exam:   Appearance - well kempt, legally blind  ENMT - maxillary sinus tenderness, clear to yellow nasal discharge, no oral exudate  Neck - no masses, trachea midline, no thyromegaly, no elevation in JVP  Respiratory - normal appearance of chest wall, normal respiratory effort w/o accessory muscle use, no dullness on percussion, no wheezing or rales  CV - s1s2 regular rate and rhythm, no murmurs, no peripheral edema, radial pulses symmetric  GI - soft, non tender  Lymph - no adenopathy noted in neck and axillary areas  MSK - normal gait  Ext - no cyanosis, clubbing, or  joint inflammation noted  Skin - no rashes, lesions, or ulcers  Neuro - normal strength, oriented x 3  Psych - normal mood and affect   Assessment/Plan:   BOOP in setting of connective tissue disease. - no improvement in respiratory symptoms after steroid burst - continue prednisone 5 mg daily  Recurrent sinus infections with acute sinusitis. - will give course of augmentin - she has been seen by Dr. Lucia [name] with ENT previously  Hx of tobacco abuse with emphysema. - will give her trial of incruse  Coronary artery disease s/p stenting. - concern her current symptoms of dyspnea related to brilinta, but no other acceptable alternative  Hx of connective tissue disease. - refer back to rheumatology if symptoms get worse    Patient Instructions  Augmentin one pill twice per day for 7 days  Incruse one puff daily  Follow up in 4 weeks, and this can be a tele visit if you prefer    Renee Mires, MD [place] Pager: [phone] 04/16/2019, 12:33 PM  Flow Sheet     Pulmonary tests:  August 2010 >> Bronchoscopy PFT 06/16/09 >> FEV1 1.99(118%), FEV1% 73, TLC 4.60(105%), DLCO 66%, no BD CT chest 11/24/11 >> centrilobular emphysema, RUL ASD CT sinus 07/30/13 >> opacification of Lt maxillary sinus, rightward NSD CT sinus 10/05/15 >> b/l mucosal edema paranasal sinuses, obstruction of ostium Rt maxillary sinus CT angio chest 07/09/17 >> GGO RUL  Cardiac tests:  Echo 12/28/18 >> EF 60 to 65%, mild LVH, normal RV systolic fx  LHC 12/30/18 >> severe 3 vessel CAD  Medications:   Allergies as of 04/16/2019      Reactions   Flomax [tamsulosin Hcl] Other (See Comments)   Made her 'trachea feel tight and couldn't swallow'   Peanut Oil Anaphylaxis, Swelling, Rash, Other (See Comments)   Throat tightens, also   Sulfa Antibiotics Anaphylaxis      Titanium Other (See Comments)   Neck wouldn't heal with titanium cervical plate   Yellow Dye Other (See  Comments)   "Bee stings" all over body when eating/taking medications with red or yellow dye    Lantus [insulin Glargine] Other (See Comments)   Patient states "sulfate binder causes sinus infection/pneumonia".   Levaquin [levofloxacin In D5w] Other (See Comments)   Muscle aches   Metoprolol-hydrochlorothiazide Other (See Comments)   Decreased B/P   Norvasc [amlodipine] Swelling, Other (See Comments)   BIL Ankle edema   Novolog [insulin Aspart]    Patient states "sulfate binder causes sinus infection/pneumonia". Tolerates Humalog.   Penciclovir Other (See Comments)   Muscle Pain   Senna [sennosides] Other (See Comments)   Pt states that it causes a "stinging" sensation   Statins Other (See Comments)   All of them make me "feel badly"   Sulfonamide Derivatives Hives   Adhesive [tape] Hives, Rash   Maxidex [dexamethasone] Anxiety, Other (See Comments)   Insomnia and dizziness, too      Medication List       Accurate as of April 16, 2019 12:33 PM. If you have any questions, ask your nurse or doctor.        amLODipine 2.5 MG tablet Commonly known as: NORVASC Take 1 tablet (2.5 mg total) by mouth daily.   amoxicillin-clavulanate 875-125 MG tablet Commonly known as: AUGMENTIN Take 1 tablet by mouth 2 (two) times daily. Started by: Renee Mires, MD   aspirin EC 81 MG tablet Take 1 tablet (81 mg total) by mouth daily.   cetirizine 10 MG tablet Commonly known as: ZYRTEC Take 10 mg by mouth daily as needed for allergies.   esomeprazole 40 MG capsule Commonly known as: NEXIUM Take 40 mg by mouth daily.   ezetimibe 10 MG tablet Commonly known as: Zetia Take 1 tablet (10 mg total) by mouth daily.   fluticasone 50 MCG/ACT nasal spray Commonly known as: FLONASE Place 1 spray into both nostrils daily.   furosemide 20 MG tablet Commonly known as: LASIX Take 1 tablet (20 mg total) by mouth daily.   HumaLOG KwikPen 100 UNIT/ML KwikPen Generic drug: insulin lispro Inject  5 Units into the skin 3 (three) times daily.   HYDROcodone-acetaminophen 7.5-325 MG tablet Commonly known as: NORCO Take 0.5-1 tablets by mouth every 6 (six) hours as needed for moderate pain.   imipramine 25 MG tablet Commonly known as: TOFRANIL Take 75 mg by mouth at bedtime.   Incruse Ellipta 62.5 MCG/INH Aepb Generic drug: umeclidinium bromide Inhale 1 puff into the lungs daily. Started by: Renee Mires, MD   levothyroxine 100 MCG tablet Commonly known as: SYNTHROID Take 100 mcg by mouth daily before breakfast.   montelukast 10 MG tablet Commonly known as: SINGULAIR Take 10 mg by mouth at bedtime.   Opcon-A 0.027-0.315 % Soln Generic drug: Naphazoline-Pheniramine Place 1-2 drops into both eyes as needed (for allergies).   Potassium Chloride ER 20 MEQ Tbcr Take 20 mEq by mouth daily.   predniSONE 5 MG tablet Commonly known as: DELTASONE TAKE 1 TABLET ONCE A DAY WITH BREAKFAST   predniSONE  10 MG tablet Commonly known as: DELTASONE Take 2 tablets (20 mg total) by mouth daily with breakfast.   ticagrelor 90 MG Tabs tablet Commonly known as: BRILINTA Take 1 tablet (90 mg total) by mouth 2 (two) times daily.   triazolam 0.25 MG tablet Commonly known as: HALCION Take 0.25 mg by mouth at bedtime.       Past Surgical History:  She  has a past surgical history that includes Total abdominal hysterectomy (1973); Bilateral oophorectomy (2002); Spinal fusion (1997); lobe thyroid removal; Thyroid lobectomy (1991); Cholecystectomy (1965); Appendectomy; Cataract extraction; Bronchoscopy (02-22-09); STENTED CARDIAC  ARTERY (2007 / 2007 / 2010); Proctoscopy (03/18/2012); Colon surgery (2014); Esophagogastroduodenoscopy (egd) with esophageal dilation (N/A, 06/18/2013); Cardiac catheterization; colonectomy; Radiology with anesthesia (N/A, 03/09/2015); Flexible sigmoidoscopy (N/A, 07/15/2015); Balloon dilation (N/A, 07/15/2015); LEFT HEART CATH AND CORONARY ANGIOGRAPHY (N/A, 12/30/2018); and  CORONARY STENT INTERVENTION (N/A, 12/30/2018).  Family History:  Her family history includes Cancer in her brother and sister; Diabetes in her brother, brother, father, and sister; Heart disease in her father; Other (age of onset: 73) in her mother; Stroke (age of onset: 74) in her father.  Social History:  She  reports that she quit smoking about 31 years ago. Her smoking use included cigarettes. She started smoking about 71 years ago. She has a 80.00 pack-year smoking history. She has never used smokeless tobacco. She reports that she does not drink alcohol or use drugs.

## 2019-04-24 ENCOUNTER — Other Ambulatory Visit: Payer: Self-pay | Admitting: Pulmonary Disease

## 2019-04-24 ENCOUNTER — Telehealth: Payer: Self-pay | Admitting: Acute Care

## 2019-04-24 NOTE — Telephone Encounter (Signed)
Left message for patient to call back to get more information. Per Schuyler Amor will not be back in the office until Nov 9th.

## 2019-04-26 ENCOUNTER — Emergency Department (HOSPITAL_COMMUNITY): Payer: Medicare Other

## 2019-04-26 ENCOUNTER — Emergency Department (HOSPITAL_COMMUNITY)
Admission: EM | Admit: 2019-04-26 | Discharge: 2019-04-26 | Disposition: A | Discharge status: Home / Self Care | Payer: Medicare Other | Attending: Emergency Medicine | Admitting: Emergency Medicine

## 2019-04-26 ENCOUNTER — Encounter (HOSPITAL_COMMUNITY): Payer: Self-pay

## 2019-04-26 ENCOUNTER — Other Ambulatory Visit: Payer: Self-pay

## 2019-04-26 DIAGNOSIS — J189 Pneumonia, unspecified organism: Secondary | ICD-10-CM | POA: Diagnosis not present

## 2019-04-26 DIAGNOSIS — Z87891 Personal history of nicotine dependence: Secondary | ICD-10-CM | POA: Insufficient documentation

## 2019-04-26 DIAGNOSIS — Z79899 Other long term (current) drug therapy: Secondary | ICD-10-CM | POA: Insufficient documentation

## 2019-04-26 DIAGNOSIS — I110 Hypertensive heart disease with heart failure: Secondary | ICD-10-CM | POA: Diagnosis not present

## 2019-04-26 DIAGNOSIS — Z91010 Allergy to peanuts: Secondary | ICD-10-CM | POA: Insufficient documentation

## 2019-04-26 DIAGNOSIS — Z794 Long term (current) use of insulin: Secondary | ICD-10-CM | POA: Diagnosis not present

## 2019-04-26 DIAGNOSIS — I5032 Chronic diastolic (congestive) heart failure: Secondary | ICD-10-CM | POA: Insufficient documentation

## 2019-04-26 DIAGNOSIS — E119 Type 2 diabetes mellitus without complications: Secondary | ICD-10-CM | POA: Insufficient documentation

## 2019-04-26 DIAGNOSIS — Z7982 Long term (current) use of aspirin: Secondary | ICD-10-CM | POA: Diagnosis not present

## 2019-04-26 DIAGNOSIS — E039 Hypothyroidism, unspecified: Secondary | ICD-10-CM | POA: Diagnosis not present

## 2019-04-26 DIAGNOSIS — I10 Essential (primary) hypertension: Secondary | ICD-10-CM | POA: Diagnosis not present

## 2019-04-26 DIAGNOSIS — I2510 Atherosclerotic heart disease of native coronary artery without angina pectoris: Secondary | ICD-10-CM | POA: Diagnosis not present

## 2019-04-26 DIAGNOSIS — J441 Chronic obstructive pulmonary disease with (acute) exacerbation: Secondary | ICD-10-CM

## 2019-04-26 DIAGNOSIS — R000 Tachycardia, unspecified: Secondary | ICD-10-CM | POA: Diagnosis not present

## 2019-04-26 DIAGNOSIS — R0902 Hypoxemia: Secondary | ICD-10-CM | POA: Diagnosis not present

## 2019-04-26 DIAGNOSIS — R0602 Shortness of breath: Secondary | ICD-10-CM | POA: Diagnosis not present

## 2019-04-26 DIAGNOSIS — E1165 Type 2 diabetes mellitus with hyperglycemia: Secondary | ICD-10-CM | POA: Diagnosis not present

## 2019-04-26 LAB — COMPREHENSIVE METABOLIC PANEL
ALT: 17 U/L (ref 0–44)
AST: 16 U/L (ref 15–41)
Albumin: 3.3 g/dL — ABNORMAL LOW (ref 3.5–5.0)
Alkaline Phosphatase: 59 U/L (ref 38–126)
Anion gap: 14 (ref 5–15)
BUN: 15 mg/dL (ref 8–23)
CO2: 26 mmol/L (ref 22–32)
Calcium: 9 mg/dL (ref 8.9–10.3)
Chloride: 91 mmol/L — ABNORMAL LOW (ref 98–111)
Creatinine, Ser: 0.82 mg/dL (ref 0.44–1.00)
GFR calc Af Amer: >60 mL/min (ref >60)
GFR calc non Af Amer: >60 mL/min (ref >60)
Glucose, Bld: 268 mg/dL — ABNORMAL HIGH (ref 70–99)
Potassium: 3.2 mmol/L — ABNORMAL LOW (ref 3.5–5.1)
Sodium: 131 mmol/L — ABNORMAL LOW (ref 135–145)
Total Bilirubin: 0.9 mg/dL (ref 0.3–1.2)
Total Protein: 6.7 g/dL (ref 6.5–8.1)

## 2019-04-26 LAB — CBC WITH DIFFERENTIAL/PLATELET
Abs Immature Granulocytes: 0.17 10*3/uL — ABNORMAL HIGH (ref 0.00–0.07)
Basophils Absolute: 0.1 10*3/uL (ref 0.0–0.1)
Basophils Relative: 1 %
Eosinophils Absolute: 0.1 10*3/uL (ref 0.0–0.5)
Eosinophils Relative: 1 %
HCT: 43.7 % (ref 36.0–46.0)
Hemoglobin: 14 g/dL (ref 12.0–15.0)
Immature Granulocytes: 2 %
Lymphocytes Relative: 19 %
Lymphs Abs: 1.8 10*3/uL (ref 0.7–4.0)
MCH: 28.2 pg (ref 26.0–34.0)
MCHC: 32 g/dL (ref 30.0–36.0)
MCV: 88.1 fL (ref 80.0–100.0)
Monocytes Absolute: 0.8 10*3/uL (ref 0.1–1.0)
Monocytes Relative: 9 %
Neutro Abs: 6.3 10*3/uL (ref 1.7–7.7)
Neutrophils Relative %: 68 %
Platelets: 223 10*3/uL (ref 150–400)
RBC: 4.96 MIL/uL (ref 3.87–5.11)
RDW: 14.3 % (ref 11.5–15.5)
WBC: 9.3 10*3/uL (ref 4.0–10.5)
nRBC: 0 % (ref 0.0–0.2)

## 2019-04-26 LAB — LACTIC ACID, PLASMA: Lactic Acid, Venous: 1.6 mmol/L (ref 0.5–1.9)

## 2019-04-26 MED ORDER — PREDNISONE 10 MG (21) PO TBPK: ORAL_TABLET | Freq: Every day | ORAL | 0 refills | Status: DC

## 2019-04-26 MED ORDER — ALBUTEROL SULFATE HFA 108 (90 BASE) MCG/ACT IN AERS
INHALATION_SPRAY | RESPIRATORY_TRACT | Status: AC
  Administered 2019-04-26: 16:00:00
  Filled 2019-04-26: qty 6.7

## 2019-04-26 MED ORDER — DOXYCYCLINE HYCLATE 100 MG PO CAPS: 100.0000 mg | ORAL_CAPSULE | Freq: Two times a day (BID) | ORAL | 0 refills | Status: DC

## 2019-04-26 MED ORDER — PREDNISONE 20 MG PO TABS: ORAL_TABLET | ORAL | 0 refills | Status: DC

## 2019-04-26 MED ORDER — ALBUTEROL SULFATE HFA 108 (90 BASE) MCG/ACT IN AERS
4.0000 | INHALATION_SPRAY | Freq: Once | RESPIRATORY_TRACT | Status: AC
  Administered 2019-04-26: 4 via RESPIRATORY_TRACT
  Filled 2019-04-26: qty 6.7

## 2019-04-26 MED ORDER — METHYLPREDNISOLONE SODIUM SUCC 125 MG IJ SOLR
125.0000 mg | Freq: Once | INTRAMUSCULAR | Status: DC
  Filled 2019-04-26: qty 2

## 2019-04-26 NOTE — Discharge Instructions (Signed)
Use your new inhaler 3-4 times a day to help with shortness of breath and follow-up with your pulmonologist this week

## 2019-04-26 NOTE — ED Notes (Signed)
Patient verbalizes understanding of discharge instructions. Opportunity for questioning and answers were provided. pt discharged from ED. Cab called.

## 2019-04-26 NOTE — ED Provider Notes (Signed)
[name]ERG[facility] DE[facility]MENT Provider Note   CSN: [id number] Arrival date & time: 04/26/19  1309     History   Chief Complaint Chief Complaint  Patient presents with  . Shortness of Breath    HPI [name] is a 85 y.o. female.     Patient complains of shortness of breath.  Mild cough.  Patient has COPD and states that she did have a sinus infection.  Mild fever but no fever here  The history is provided by the patient. No language interpreter was used.  Shortness of Breath Severity:  Moderate Onset quality:  Sudden Timing:  Constant Chronicity:  New Context: activity   Relieved by:  Nothing Associated symptoms: no abdominal pain, no chest pain, no cough, no headaches and no rash     Past Medical History:  Diagnosis Date  . Adenomatous colon polyp   . Arthritis   . Blood transfusion   . Cancer [facility])    cervical cancer pre hysterectomy  . Cervical disc disease   . Chronic sinusitis   . CONSTIPATION, CHRONIC 06/26/2007   Qualifier: Diagnosis of  By: Nils Pyle CMA ([place]), Mearl Latin    . COPD (chronic obstructive pulmonary disease) ([place])   . Coronary artery disease    prior MI with stenting of RCA with repeat PTCA/stenting for ISR in 01/2008, stent to LM 11/2008  . CVA (cerebral infarction) 1991  . Depression   . Diabetes mellitus   . Diverticulosis   . 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 Dyslipidemia   . Emphysema   . Fibromyalgia   . GERD (gastroesophageal reflux disease)   . Hypertension   . Hypothyroidism   . IBS (irritable bowel syndrome)   . Legally blind   . Macular degeneration   . Myocardial infarction [facility]) may 27th 2007  . Pneumonia 07/16/2013    HX PNEUMONIA  . Pulmonary hypertension ([place])   . Raynaud's phenomenon   . Scleroderma ([place])   . Sjogren's disease ([place])   . Sleep apnea    STOP BANG SCORE 5  . Stroke [facility]) 1990   brain stem stroke - weakness rt hand  . Zenker's diverticulum     Patient Active Problem List   Diagnosis Date Noted  . Chest pain 12/29/2018  . Angina pectoris ([place]) 12/28/2018  . CAD (coronary artery disease) 12/28/2018  . Chronic sinusitis 12/28/2018  . Fever and chills 12/24/2018  . Allergy to multiple antibiotics 12/24/2018  . GERD (gastroesophageal reflux disease) 09/11/2018  . Dyspnea 09/11/2018  . Grade II diastolic dysfunction 0000000  . Thrombocytopenia ([place]) 07/30/2018  . Amenia 07/17/2018  . Recurrent sinus infections 07/05/2018  . Jaw pain 06/17/2018  . Multifocal pneumonia 06/17/2018  . Abnormal ECG   . SOB (shortness of breath), secondary to [place] 07/16/2017  . Chest pain, moderate coronary artery risk 07/13/2017  . Hypoxia 07/09/2017  . Dehydration   . Enteritis 11/17/2016  . Nondisplaced fracture of second metatarsal bone, left foot, subsequent encounter for fracture with routine healing 06/29/2016  . Arthralgia of both lower legs 03/30/2016  . Hypothyroidism 10/01/2015  . Sepsis ([place]) 05/16/2015  . HCAP (healthcare-associated pneumonia) 05/16/2015  . Diarrhea 05/02/2015  . Diarrhea with dehydration 05/02/2015  . Hyponatremia 05/02/2015  . Weakness 01/29/2015  . Pain in joint, ankle and foot 11/20/2014  . Acute bronchitis 09/01/2014  . CAP (community acquired pneumonia) 05/26/2014  . Dysphagia, unspecified(787.20) 06/18/2013  . Stricture and stenosis of esophagus 06/18/2013  . Urinary incontinence in female  05/06/2012  . Fibromyalgia   . Chronic confusional state 03/21/2012  . Postoperative anemia 03/21/2012  . Anemia of chronic disease 03/19/2012  . Type 2 diabetes mellitus ([place]) 05/30/2011  . COPD (chronic obstructive pulmonary disease) ([place]) 05/30/2011  . Scleroderma ([place]) 05/30/2011  . Colonic stricture s/p lap sigmoid colectomy 03/18/2012 03/27/2011  . Diverticulosis of colon 03/27/2011  . Systemic sclerosis ([place]) 06/29/2009  . Pure hypercholesterolemia 05/25/2009  . BOOP (bronchiolitis obliterans with organizing pneumonia) ([place]) 02/18/2009   . Chronic rhinitis 07/08/2007  . EMPHYSEMA 07/08/2007  . Hyperlipidemia 06/26/2007  . Depression 06/26/2007  . Legal blindness 06/26/2007  . HTN (hypertension) 06/26/2007  . Coronary atherosclerosis 06/26/2007  . ZENKER'S DIVERTICULUM 06/26/2007  . Chronic constipation 06/26/2007  . Irritable bowel syndrome 06/26/2007    Past Surgical History:  Procedure Laterality Date  . APPENDECTOMY    . BALLOON DILATION N/A 07/15/2015   Procedure: BALLOON DILATION;  Surgeon: Milus Banister, MD;  Location: [facility];  Service: Endoscopy;  Laterality: N/A;  . BILATERAL OOPHORECTOMY  2002  . BRONCHOSCOPY  02-22-09  . CARDIAC CATHETERIZATION     x 3  . CATARACT EXTRACTION    . CHOLECYSTECTOMY  1965  . COLON SURGERY  2014   colon resection  . colonectomy    . CORONARY STENT INTERVENTION N/A 12/30/2018   Procedure: CORONARY STENT INTERVENTION;  Surgeon: Sherren Mocha, MD;  Location: [place] [facility];  Service: Cardiovascular;  Laterality: N/A;  . ESOPHAGOGASTRODUODENOSCOPY (EGD) WITH ESOPHAGEAL DILATION N/A 06/18/2013   Procedure: ESOPHAGOGASTRODUODENOSCOPY (EGD) WITH ESOPHAGEAL DILATION;  Surgeon: Inda Castle, MD;  Location: [place];  Service: Endoscopy;  Laterality: N/A;  . FLEXIBLE SIGMOIDOSCOPY N/A 07/15/2015   Procedure: FLEXIBLE SIGMOIDOSCOPY;  Surgeon: Milus Banister, MD;  Location: [facility];  Service: Endoscopy;  Laterality: N/A;  . LEFT HEART CATH AND CORONARY ANGIOGRAPHY N/A 12/30/2018   Procedure: LEFT HEART CATH AND CORONARY ANGIOGRAPHY;  Surgeon: Jolaine Artist, MD;  Location: [place] [facility];  Service: Cardiovascular;  Laterality: N/A;  . lobe thyroid removal    . PROCTOSCOPY  03/18/2012   Procedure: PROCTOSCOPY;  Surgeon: Adin Hector, MD;  Location: [facility];  Service: General;  Laterality: N/A;  rigid procto  . RADIOLOGY WITH ANESTHESIA N/A 03/09/2015   Procedure: MRI CERVICAL SPINE WITHOUT AND LUMBER WITHOUT;  Surgeon: Medication Radiologist, MD;  Location:  [place];  Service: Radiology;  Laterality: N/A;  . [place]   and implantation of a plate   . STENTED CARDIAC  ARTERY  2007 / 2007 / 2010   X 3 STENT  . THYROID LOBECTOMY  1991   removal of left lobe thyroid  . TOTAL ABDOMINAL HYSTERECTOMY  1973     OB History   No obstetric history on file.      Home Medications    Prior to Admission medications   Medication Sig Start Date End Date Taking? Authorizing Provider  amLODipine (NORVASC) 2.5 MG tablet Take 1 tablet (2.5 mg total) by mouth daily. 10/11/18  Yes Fay Records, MD  aspirin EC 81 MG tablet Take 1 tablet (81 mg total) by mouth daily. 11/18/13  Yes Fay Records, MD  cetirizine (ZYRTEC) 10 MG tablet Take 10 mg by mouth daily as needed for allergies.   Yes [provider]  esomeprazole (NEXIUM) 40 MG capsule Take 40 mg by mouth daily.    Yes [provider]  fluticasone (FLONASE) 50 MCG/ACT nasal spray Place 1 spray into  both nostrils daily. 07/11/17  Yes Ghimire, Henreitta Leber, MD  furosemide (LASIX) 20 MG tablet Take 1 tablet (20 mg total) by mouth daily. 09/03/18  Yes Daune Perch, NP  HYDROcodone-acetaminophen (NORCO) 7.5-325 MG tablet Take 0.5-1 tablets by mouth every 6 (six) hours as needed for moderate pain.    Yes [provider]  imipramine (TOFRANIL) 25 MG tablet Take 75 mg by mouth at bedtime.  05/26/09  Yes [provider]  insulin lispro (HUMALOG KWIKPEN) 100 UNIT/ML KwikPen Inject 5 Units into the skin 3 (three) times daily.    Yes [provider]  levothyroxine (SYNTHROID, LEVOTHROID) 100 MCG tablet Take 100 mcg by mouth daily before breakfast.   Yes [provider]  montelukast (SINGULAIR) 10 MG tablet Take 10 mg by mouth at bedtime.  05/10/15  Yes [provider]  Naphazoline-Pheniramine (OPCON-A) 0.027-0.315 % SOLN Place 1-2 drops into both eyes as needed (for allergies).   Yes [provider]  potassium chloride 20 MEQ TBCR Take 20 mEq by  mouth daily. 01/03/19 01/03/20 Yes Daune Perch, NP  ticagrelor (BRILINTA) 90 MG TABS tablet Take 1 tablet (90 mg total) by mouth 2 (two) times daily. 01/28/19  Yes Isla Pence, MD  triazolam (HALCION) 0.25 MG tablet Take 0.25 mg by mouth at bedtime.    Yes [provider]  umeclidinium bromide (INCRUSE ELLIPTA) 62.5 MCG/INH AEPB Inhale 1 puff into the lungs daily. 04/16/19  Yes Chesley Mires, MD  doxycycline (VIBRAMYCIN) 100 MG capsule Take 1 capsule (100 mg total) by mouth 2 (two) times daily. One po bid x 7 days 04/26/19   Milton Ferguson, MD  ezetimibe (ZETIA) 10 MG tablet Take 1 tablet (10 mg total) by mouth daily. Patient not taking: Reported on 04/26/2019 12/31/18 12/31/19  Donne Hazel, MD  predniSONE (DELTASONE) 20 MG tablet Take 6 pills for 3 days then 4 pills for 3 days then 2 pills for 3 days 04/26/19   Milton Ferguson, MD    Family History Family History  Problem Relation Age of Onset  . Other Mother 45       Died from sepsis  . Stroke Father 84       died  . Heart disease Father   . Diabetes Father   . Cancer Sister        lung  . Diabetes Sister   . Cancer Brother        lung  . Diabetes Brother   . Diabetes Brother     Social History Social History   Tobacco Use  . Smoking status: Former Smoker    Packs/day: 2.00    Years: 40.00    Pack years: 80.00    Types: Cigarettes    Start date: 1949    Quit date: 1989    Years since quitting: 31.8  . Smokeless tobacco: Never Used  Substance Use Topics  . Alcohol use: No  . Drug use: No     Allergies   Flomax [tamsulosin hcl], Peanut oil, Sulfa antibiotics, Titanium, Yellow dye, Lantus [insulin glargine], Levaquin [levofloxacin in d5w], Metoprolol-hydrochlorothiazide, Norvasc [amlodipine], Novolog [insulin aspart], Penciclovir, Red dye, Senna [sennosides], Statins, Sulfonamide derivatives, Adhesive [tape], and Maxidex [dexamethasone]   Review of Systems Review of Systems  Constitutional: Negative for  appetite change and fatigue.  HENT: Negative for congestion, ear discharge and sinus pressure.   Eyes: Negative for discharge.  Respiratory: Positive for shortness of breath. Negative for cough.   Cardiovascular: Negative for chest pain.  Gastrointestinal:  Negative for abdominal pain and diarrhea.  Genitourinary: Negative for frequency and hematuria.  Musculoskeletal: Negative for back pain.  Skin: Negative for rash.  Neurological: Negative for seizures and headaches.  Psychiatric/Behavioral: Negative for hallucinations.     Physical Exam Updated Vital Signs BP (!) 139/55 (BP Location: Right Arm)   Pulse (!) 104   Temp 98.9 F (37.2 C) (Oral)   Resp 20   Ht 5\' 2"  (1.575 m)   Wt 56.7 kg   SpO2 95%   BMI 22.86 kg/m   Physical Exam Vitals signs and nursing note reviewed.  Constitutional:      Appearance: She is well-developed.  HENT:     Head: Normocephalic.     Nose: Nose normal.  Eyes:     General: No scleral icterus.    Conjunctiva/sclera: Conjunctivae normal.  Neck:     Musculoskeletal: Neck supple.     Thyroid: No thyromegaly.  Cardiovascular:     Rate and Rhythm: Normal rate and regular rhythm.     Heart sounds: No murmur. No friction rub. No gallop.   Pulmonary:     Breath sounds: No stridor. Wheezing present. No rales.  Chest:     Chest wall: No tenderness.  Abdominal:     General: There is no distension.     Tenderness: There is no abdominal tenderness. There is no rebound.  Musculoskeletal: Normal range of motion.  Lymphadenopathy:     Cervical: No cervical adenopathy.  Skin:    Findings: No erythema or rash.  Neurological:     Mental Status: She is alert and oriented to person, place, and time.     Motor: No abnormal muscle tone.     Coordination: Coordination normal.  Psychiatric:        Behavior: Behavior normal.      ED Treatments / Results  Labs (all labs ordered are listed, but only abnormal results are displayed) Labs Reviewed   COMPREHENSIVE METABOLIC PANEL - Abnormal; Notable for the following components:      Result Value   Sodium 131 (*)    Potassium 3.2 (*)    Chloride 91 (*)    Glucose, Bld 268 (*)    Albumin 3.3 (*)    All other components within normal limits  CBC WITH DIFFERENTIAL/PLATELET - Abnormal; Notable for the following components:   Abs Immature Granulocytes 0.17 (*)    All other components within normal limits  LACTIC ACID, PLASMA  CBC WITH DIFFERENTIAL/PLATELET    EKG None  Radiology Dg Chest Port 1 View  Result Date: 04/26/2019 CLINICAL DATA:  Shortness of breath. EXAM: PORTABLE CHEST 1 VIEW COMPARISON:  01/28/2019 FINDINGS: The heart size and mediastinal contours are within normal limits. Stable emphysematous lung disease and bibasilar scarring. No significant hyperinflation. There is no evidence of pulmonary edema, consolidation, pneumothorax, nodule or pleural fluid. The visualized skeletal structures are unremarkable. IMPRESSION: Stable emphysematous lung disease and bibasilar scarring. Electronically Signed   By: Aletta Edouard M.D.   On: 04/26/2019 14:28    Procedures Procedures (including critical care time)  Medications Ordered in ED Medications  methylPREDNISolone sodium succinate (SOLU-MEDROL) 125 mg/2 mL injection 125 mg (has no administration in time range)  albuterol (VENTOLIN HFA) 108 (90 Base) MCG/ACT inhaler 4 puff (4 puffs Inhalation Not Given 04/26/19 1514)     Initial Impression / Assessment and Plan / ED Course  I have reviewed the triage vital signs and the nursing notes.  Pertinent labs & imaging results that were available  during my care of the patient were reviewed by me and considered in my medical decision making (see chart for details).        Patient with exacerbation of COPD and bronchitis.  Patient is not hypoxic ambulating.  She will be discharged home with prednisone increase and doxycycline.  She will follow-up with her pulmonologist  Final  Clinical Impressions(s) / ED Diagnoses   Final diagnoses:  COPD exacerbation [facility])    ED Discharge Orders         Ordered    doxycycline (VIBRAMYCIN) 100 MG capsule  2 times daily     04/26/19 1603    predniSONE (STERAPRED UNI-PAK 21 TAB) 10 MG (21) TBPK tablet  Daily,   Status:  Discontinued     04/26/19 1603    predniSONE (STERAPRED UNI-PAK 21 TAB) 10 MG (21) TBPK tablet  Daily,   Status:  Discontinued     04/26/19 1604    predniSONE (DELTASONE) 20 MG tablet     04/26/19 1610           Milton Ferguson, MD 04/26/19 [phone]

## 2019-04-26 NOTE — ED Triage Notes (Signed)
Patient arrives via EMS due to having new onset shortness of breath with extertion that started this morning. Per patient, she gets PNA around this time every year and this feels the same.   Hx: Pna, copd , and emphysema.

## 2019-04-26 NOTE — ED Notes (Signed)
Pt ambulated in hallway. Patient oxygen saturation maintained between 96%-100% on room air. Still some dyspnea with exertion.

## 2019-04-28 ENCOUNTER — Ambulatory Visit (INDEPENDENT_AMBULATORY_CARE_PROVIDER_SITE_OTHER): Payer: Medicare Other | Admitting: Pulmonary Disease

## 2019-04-28 ENCOUNTER — Telehealth: Payer: Self-pay | Admitting: Pulmonary Disease

## 2019-04-28 ENCOUNTER — Encounter: Payer: Self-pay | Admitting: Pulmonary Disease

## 2019-04-28 DIAGNOSIS — J449 Chronic obstructive pulmonary disease, unspecified: Secondary | ICD-10-CM

## 2019-04-28 DIAGNOSIS — Z20822 Contact with and (suspected) exposure to covid-19: Secondary | ICD-10-CM | POA: Insufficient documentation

## 2019-04-28 MED ORDER — DOXYCYCLINE HYCLATE 100 MG PO TABS: 100.0000 mg | ORAL_TABLET | Freq: Two times a day (BID) | ORAL | 0 refills | Status: DC

## 2019-04-28 NOTE — Patient Instructions (Addendum)
You were seen today by Lauraine Rinne, NP  for:   1.  COPD exacerbation  Doxycycline >>> 1 100 mg tablet every 12 hours for 10 days >>>take with food  >>>wear sunscreen   Prednisone 20mg  tablet  >>>3 tabs for 3 days, then 2 tabs for 3 days, 2 tabs for 3 days, then 1 tab for 3 days, then stop >>>take with food  >>>take in the morning   - Novel Coronavirus, NAA ([facility])  2. Suspected COVID-19 virus infection  If symptoms worsen despite these interventions please present back to the emergency room or contact 911.  Start monitoring temperatures at home Start monitoring oxygen saturations at home >>>You need to maintain oxygen saturations above 90%  - Novel Coronavirus, NAA ([facility])  COVID Testing Site Locations (For sick patients only, pre-procedure is done differently)  . [place] [address] [place], [place] [address] . [place]  (on [employer])  o Nature conservation officer  . [facility] Short Stay     We recommend today:  Orders Placed This Encounter  Procedures  . Novel Coronavirus, NAA ([facility])    Order Specific Question:   Is this test for diagnosis or screening    Answer:   Diagnosis of ill patient    Order Specific Question:   Symptomatic for COVID-19 as defined by CDC    Answer:   Yes    Order Specific Question:   Date of Symptom Onset    Answer:   04/21/2019    Order Specific Question:   Hospitalized for COVID-19    Answer:   No    Order Specific Question:   Admitted to ICU for COVID-19    Answer:   No    Order Specific Question:   Previously tested for COVID-19    Answer:   No    Order Specific Question:   Resident in a congregate (group) care setting    Answer:   No    Order Specific Question:   Is the patient student?    Answer:   No    Order Specific Question:   Employed in healthcare setting    Answer:   No    Order Specific Question:   Pregnant    Answer:   No   Orders Placed This Encounter   Procedures  . Novel Coronavirus, NAA ([facility])   Meds ordered this encounter  Medications  . DISCONTD: doxycycline (VIBRA-TABS) 100 MG tablet    Sig: Take 1 tablet (100 mg total) by mouth 2 (two) times daily.    Dispense:  6 tablet    Refill:  0  . doxycycline (VIBRA-TABS) 100 MG tablet    Sig: Take 1 tablet (100 mg total) by mouth 2 (two) times daily.    Dispense:  14 tablet    Refill:  0    Follow Up:    Return in about 3 days (around 05/01/2019), or if symptoms worsen or fail to improve, for Follow up with Dr. Halford Chessman, Follow up with [name] FNP-C.   Please do your part to reduce the spread of COVID-19:      Reduce your risk of any infection  and COVID19 by using the similar precautions used for avoiding the common cold or flu:  [name] Wash your hands often with soap and warm water for at least 20 seconds.  If soap and water are not readily available, use  an alcohol-based hand sanitizer with at least 60% alcohol.  . If coughing or sneezing, cover your mouth and nose by coughing or sneezing into the elbow areas of your shirt or coat, into a tissue or into your sleeve (not your hands). Langley Gauss A MASK when in public  . Avoid shaking hands with others and consider head nods or verbal greetings only. . Avoid touching your eyes, nose, or mouth with unwashed hands.  . Avoid close contact with people who are sick. . Avoid places or events with large numbers of people in one location, like concerts or sporting events. . If you have some symptoms but not all symptoms, continue to monitor at home and seek medical attention if your symptoms worsen. . If you are having a medical emergency, call 911.   [place] / e-Visit: [email or web address]         [facility]: [facility]: [id number]                   [facility]: [id number]      It is flu season:   >>> Best ways to protect herself from the flu: Receive the yearly flu vaccine, practice good hand hygiene washing with soap and also using hand sanitizer when available, eat a nutritious meals, get adequate rest, hydrate appropriately   Please contact the office if your symptoms worsen or you have concerns that you are not improving.   Thank you for choosing [facility] for your healthcare, and for allowing [place] to partner with you on your healthcare journey. I am thankful to be able to provide care to you today.   [name] FNP-C

## 2019-04-28 NOTE — Telephone Encounter (Signed)
LMTCB x2 for pt 

## 2019-04-28 NOTE — Progress Notes (Signed)
Reviewed and agree with assessment/plan.   Chaim Gatley, MD [facility] 06/21/2016, 12:24 PM Pager:  [phone]  

## 2019-04-28 NOTE — Assessment & Plan Note (Signed)
Plan: Patient needs outpatient Covid testing Daughter will take patient to outpatient Covid testing on 04/29/2019 Close follow-up with our office via televisit in 2 to 3 days Start monitoring temperatures Start monitoring oxygen levels to maintain oxygen saturations above 90%

## 2019-04-28 NOTE — Telephone Encounter (Signed)
Called and spoke to [name], daughter who is listed on DPR and patient.  Patient sent to the ED on 04/26/2019 for what "feels like pneumonia."  Patient stated she has been running a fever but most recent temp is 100.4. Patient reported increased weakness, very shaky and shortness of breath.  In ED patient did have chest xray which showed stable emphysematous lung disease and bibasilar scarring.  Patient's daughter whispered into the phone that she is concerned that her mother did not get what she needed in the ED because she has recentlly had a personality change.  Patient did not get an IV started, and wasn't tested for COVID.  Patient's daughter is concerned this personality change is stroke related and that her mother may have refused things and asked to leave.   Scheduled patient for a telephone visit with NP. Nothing further needed at this time.

## 2019-04-28 NOTE — Assessment & Plan Note (Signed)
Recently treated for COPD exacerbation in the emergency room Unfortunately patient was not tested for SARS-CoV-2 despite elevated fevers, shortness of breath, fatigue and recent antibiotic use without symptom improvement  Plan: Extend doxycycline coverage for 10 days total Reviewed prednisone dosing with family and patient, change prednisone dosing to 60 mg for 3 days, then decrease down to 20 mg every 3 days Outpatient Covid testing ordered 2 to 3-day televisit with our office to further evaluate and monitor symptoms

## 2019-04-28 NOTE — Progress Notes (Signed)
Virtual Visit via Telephone Note  I connected with [name] on 04/28/19 at  4:00 PM EST by telephone and verified that I am speaking with the correct person using two identifiers.  Location: Patient: Home Provider: [facility] - [id number], [place], [place], [place] [address]   I discussed the limitations, risks, security and privacy concerns of performing an evaluation and management service by telephone and the availability of in person appointments. I also discussed with the patient that there may be a patient responsible charge related to this service. The patient expressed understanding and agreed to proceed.  Patient consented to consult via telephone: Yes People present and their role in pt care: Pt   History of Present Illness:  85 year old female former smoker followed in our office for abnormal CT (probable Boop), emphysema  PMH: Scleroderma (managed by Dr. Estanislado Pandy), chronic sinusitis (managed by Dr. Lucia Gaskins with ENT) Smoker/ Smoking History: Former smoker.  80-pack-year smoking history. Maintenance: 5 mg prednisone Pt of: Dr. Halford Chessman   Chief complaint: Fever, recent ED visit   85 year old female former smoker followed in our office for probable Boop is emphysema and recurrent sinusitis.  Patient also has scleroderma managed by rheumatology. Patient was last seen by Dr. Halford Chessman on 04/16/2019.  Patient was treated as an acute sinusitis with Augmentin.  Patient reports she has had increased fatigue, shortness of breath as well as persistently elevated temperatures for the last week.  This despite taking antipyretics such as Tylenol.  Patient presented to the emergency room on 04/26/2019 where she was diagnosed as a COPD exacerbation.  Unfortunately they did not Covid test the patient.  Patient was discharged on the exceptionally high amount of prednisone as well as a 7-day course of doxycycline.  Patient contacted our office for further  evaluation.  Observations/Objective:  04/26/2019 - chest xray - stable emphysema lung disease   04/28/2019 - temp - 100.4  06/22/2018-chest x-ray-progressive infiltrates in the right lung consistent with pneumonia, new pulmonary vascular congestion interstitial edema and small effusions suggesting congestive heart failure  07/09/2017-CT Angio-negative for acute PE, mild groundglass density in the posterior right upper lobe could relate to minimal residual inflammation or infiltrate  08/29/2018-CT maxillofacial- no evidence of chronic sinusitis  06/18/2017-echocardiogram-LV ejection fraction 60 to 123456, grade 2 diastolic dysfunction  Assessment and Plan:  Suspected COVID-19 virus infection Plan: Patient needs outpatient Covid testing Daughter will take patient to outpatient Covid testing on 04/29/2019 Close follow-up with our office via televisit in 2 to 3 days Start monitoring temperatures Start monitoring oxygen levels to maintain oxygen saturations above 90%  COPD (chronic obstructive pulmonary disease) ([place]) Recently treated for COPD exacerbation in the emergency room Unfortunately patient was not tested for SARS-CoV-2 despite elevated fevers, shortness of breath, fatigue and recent antibiotic use without symptom improvement  Plan: Extend doxycycline coverage for 10 days total Reviewed prednisone dosing with family and patient, change prednisone dosing to 60 mg for 3 days, then decrease down to 20 mg every 3 days Outpatient Covid testing ordered 2 to 3-day televisit with our office to further evaluate and monitor symptoms    Follow Up Instructions:  Return in about 3 days (around 05/01/2019), or if symptoms worsen or fail to improve, for Follow up with Dr. Halford Chessman, Follow up with [name] FNP-C.   I discussed the assessment and treatment plan with the patient. The patient was provided an opportunity to ask questions and all were answered. The patient agreed with the plan  and  demonstrated an understanding of the instructions.   The patient was advised to call back or seek an in-person evaluation if the symptoms worsen or if the condition fails to improve as anticipated.  I provided 25 minutes of non-face-to-face time during this encounter.   Lauraine Rinne, NP

## 2019-04-29 ENCOUNTER — Other Ambulatory Visit: Payer: Self-pay

## 2019-04-29 DIAGNOSIS — Z20822 Contact with and (suspected) exposure to covid-19: Secondary | ICD-10-CM

## 2019-04-29 NOTE — Telephone Encounter (Signed)
SG please call pt when available.

## 2019-04-30 LAB — NOVEL CORONAVIRUS, NAA: SARS-CoV-2, NAA: NOT DETECTED

## 2019-04-30 NOTE — Telephone Encounter (Signed)
Please schedule a televisit for next week when I am back. Thanks so much

## 2019-04-30 NOTE — Progress Notes (Signed)
Negative Covid test.  This is great news.  Office visit tomorrow can either be a televisit or can be in person for further evaluation and follow-up.  This is up to the patient.  [name], FNP

## 2019-04-30 NOTE — Telephone Encounter (Signed)
Called spoke with patient who reported she is already scheduled for televisit with Aaron Edelman NP tomorrow 11.5.2020 @ 1430.  Appt has been verified.  Will sign off.

## 2019-05-01 ENCOUNTER — Other Ambulatory Visit: Payer: Self-pay

## 2019-05-01 ENCOUNTER — Encounter: Payer: Self-pay | Admitting: Pulmonary Disease

## 2019-05-01 ENCOUNTER — Ambulatory Visit (INDEPENDENT_AMBULATORY_CARE_PROVIDER_SITE_OTHER): Payer: Medicare Other | Admitting: Pulmonary Disease

## 2019-05-01 DIAGNOSIS — J329 Chronic sinusitis, unspecified: Secondary | ICD-10-CM

## 2019-05-01 DIAGNOSIS — R509 Fever, unspecified: Secondary | ICD-10-CM

## 2019-05-01 DIAGNOSIS — E1169 Type 2 diabetes mellitus with other specified complication: Secondary | ICD-10-CM | POA: Diagnosis not present

## 2019-05-01 DIAGNOSIS — Z794 Long term (current) use of insulin: Secondary | ICD-10-CM

## 2019-05-01 DIAGNOSIS — M349 Systemic sclerosis, unspecified: Secondary | ICD-10-CM | POA: Diagnosis not present

## 2019-05-01 DIAGNOSIS — J449 Chronic obstructive pulmonary disease, unspecified: Secondary | ICD-10-CM | POA: Diagnosis not present

## 2019-05-01 DIAGNOSIS — Z881 Allergy status to other antibiotic agents status: Secondary | ICD-10-CM

## 2019-05-01 NOTE — Patient Instructions (Addendum)
You were seen today by Lauraine Rinne, NP  for:   1. Type 2 diabetes mellitus with other specified complication, with long-term current use of insulin ([place])  - Ambulatory referral to Endocrinology  2. Recurrent sinus infections  Offered ENT referral, patient declined  3. Fever and chills  Recent negative Covid test this is reassuring  4. Scleroderma ([place])  Continue follow-up with rheumatology After completing prednisone taper can resume 5 mg prednisone daily  5. Allergy to multiple antibiotics/ drugs  Multiple allergies to medications as well as antibiotics.  This continues to be a persistent barrier in regards to management of patient's blood sugars as well as antibiotic use  Need to discuss with primary care as well as with endocrinology  6. Chronic obstructive pulmonary disease, unspecified COPD type (HCC)  Incruse Ellipta  >>> Take 1 puff daily in the morning right when you wake up >>>Rinse your mouth out after use >>>This is a daily maintenance inhaler, NOT a rescue inhaler >>>Contact our office if you are having difficulties affording or obtaining this medication >>>It is important for you to be able to take this daily and not miss any doses    Patient needs to keep follow-up later on this month with Dr. Halford Chessman Patient would also benefit with a walk at that office to evaluate oxygen levels   We recommend today:  Orders Placed This Encounter  Procedures  . Ambulatory referral to Endocrinology    Referral Priority:   Urgent    Referral Type:   Consultation    Referral Reason:   Specialty Services Required    Number of Visits Requested:   1   Orders Placed This Encounter  Procedures  . Ambulatory referral to Endocrinology   No orders of the defined types were placed in this encounter.   Follow Up:    Return in about 4 weeks (around 05/29/2019), or if symptoms worsen or fail to improve, for Follow up with Dr. Halford Chessman, With walk in office.   Please do your part to  reduce the spread of COVID-19:      Reduce your risk of any infection  and COVID19 by using the similar precautions used for avoiding the common cold or flu:  [name] Wash your hands often with soap and warm water for at least 20 seconds.  If soap and water are not readily available, use an alcohol-based hand sanitizer with at least 60% alcohol.  . If coughing or sneezing, cover your mouth and nose by coughing or sneezing into the elbow areas of your shirt or coat, into a tissue or into your sleeve (not your hands). Langley Gauss A MASK when in public  . Avoid shaking hands with others and consider head nods or verbal greetings only. . Avoid touching your eyes, nose, or mouth with unwashed hands.  . Avoid close contact with people who are sick. . Avoid places or events with large numbers of people in one location, like concerts or sporting events. . If you have some symptoms but not all symptoms, continue to monitor at home and seek medical attention if your symptoms worsen. . If you are having a medical emergency, call 911.   [place] / e-Visit: [email or web address]         [facility]: [facility]: [id number]                   [facility]:  [phone]     It is flu season:   >>> Best ways to protect herself from the flu: Receive the yearly flu vaccine, practice good hand hygiene washing with soap and also using hand sanitizer when available, eat a nutritious meals, get adequate rest, hydrate appropriately   Please contact the office if your symptoms worsen or you have concerns that you are not improving.   Thank you for choosing [facility] for your healthcare, and for allowing [place] to partner with you on your healthcare journey. I am thankful to be able to provide care to you today.   [name] FNP-C

## 2019-05-01 NOTE — Assessment & Plan Note (Signed)
Patient's blood sugars greater than 300 Currently on prednisone  Plan: Patient needs to follow-up with her current endocrinologist Referral to endocrinology within the [facility] system per patient request

## 2019-05-01 NOTE — Assessment & Plan Note (Signed)
Plan: After prednisone taper resume 5 mg of prednisone daily Continue follow-up with rheumatology

## 2019-05-01 NOTE — Progress Notes (Signed)
Virtual Visit via Telephone Note  I connected with [name] on 05/01/19 at  2:30 PM EST by telephone and verified that I am speaking with the correct person using two identifiers.  Location: Patient: Home Provider: Office [facility] - [id number], [place], [place], [place] [address]   I discussed the limitations, risks, security and privacy concerns of performing an evaluation and management service by telephone and the availability of in person appointments. I also discussed with the patient that there may be a patient responsible charge related to this service. The patient expressed understanding and agreed to proceed.  Patient consented to consult via telephone: Yes People present and their role in pt care: Pt   History of Present Illness:  85 year old female former smoker followed in our office for abnormal CT (probable Boop), emphysema  PMH: Scleroderma (managed by Dr. Estanislado Pandy), chronic sinusitis (managed by Dr. Lucia Gaskins with ENT) Smoker/ Smoking History: Former smoker. Quit 1989. 80-pack-year smoking history. Maintenance: Incruse Ellipta, 5 mg prednisone Pt of: Dr. Halford Chessman  Chief complaint: 2-day follow-up from emergency room visit, televisit  85 year old female former smoker followed in our office for history of chronic sinusitis, emphysema and probable Boop.  Patient was previously evaluated in the emergency room on 04/26/2019 due to worsened acute symptoms status post likely sinus infection and patient was treated with Augmentin.  Patient did not improve and she presented to the emergency room.  Patient was sent home and diagnosed as a COPD exacerbation.  Patient was never tested for Covid.  Patient completed a televisit with our office on 04/28/2019.  Our office ordered that the patient get a Covid test completed outpatient.  This resulted to be negative.  Patient was treated with a 10-day course of doxycycline which she is still finishing up as well as a  prednisone taper.   Patient has a long history of medication allergies.  Patient is under the impression that she is having a reaction to the doxycycline and that is what is making her fatigue.  Patient also under the impression that her blood sugars are elevated specifically due to her Brilinta that she is taking.  Patient also feels that her fatigue as well as sinus problems are due to ingredients in her insulin.  This is why she is intolerant of taking Lantus.  Unfortunately right now due to her prednisone she is currently taking her blood sugars are greater than 300.  She had an appointment with endocrinology this week but she missed it due to her acute symptoms.  She is followed by Dr. Royce Macadamia.    Observations/Objective:  05/01/2019 - SPO2 - 9  04/29/2019-SARS-CoV-2-negative  04/26/2019 - chest xray - stable emphysema lung disease   04/28/2019 - temp - 100.4  06/22/2018-chest x-ray-progressive infiltrates in the right lung consistent with pneumonia, new pulmonary vascular congestion interstitial edema and small effusions suggesting congestive heart failure  07/09/2017-CT Angio-negative for acute PE, mild groundglass density in the posterior right upper lobe could relate to minimal residual inflammation or infiltrate  08/29/2018-CT maxillofacial- no evidence of chronic sinusitis  06/18/2017-echocardiogram-LV ejection fraction 60 to 123456, grade 2 diastolic dysfunction  SIX MIN WALK 07/17/2018  Supplimental Oxygen during Test? (L/min) No  Tech Comments: no desaturations with walk 160 meters/2 laps No complaints of SOB or pain TA/CAM    Assessment and Plan:  Recurrent sinus infections Offered ENT referral today, patient declined  Plan: I believe reasonable for the patient to stop taking doxycycline tomorrow on 05/02/2019  Fever and chills Recently Covid negative This is good news  Type 2 diabetes mellitus (HCC) Patient's blood sugars greater than 300 Currently on  prednisone  Plan: Patient needs to follow-up with her current endocrinologist Referral to endocrinology within the [facility] system per patient request  COPD (chronic obstructive pulmonary disease) ([place]) Plan: Finish doxycycline tomorrow then stop Continue prednisone Continue Incruse Ellipta Follow-up with our office on 05/16/2019 as planned Patient will need a walk at next office visit  Allergy to multiple antibiotics Multiple drug allergies Multiple allergies to antibiotics  This continues to be a persistent barrier regarding patient's care  Scleroderma ([place]) Plan: After prednisone taper resume 5 mg of prednisone daily Continue follow-up with rheumatology   Follow Up Instructions:  Return in about 4 weeks (around 05/29/2019), or if symptoms worsen or fail to improve, for Follow up with Dr. Halford Chessman, With walk in office.   I discussed the assessment and treatment plan with the patient. The patient was provided an opportunity to ask questions and all were answered. The patient agreed with the plan and demonstrated an understanding of the instructions.   The patient was advised to call back or seek an in-person evaluation if the symptoms worsen or if the condition fails to improve as anticipated.  I provided 28 minutes of non-face-to-face time during this encounter.   Lauraine Rinne, NP

## 2019-05-01 NOTE — Assessment & Plan Note (Addendum)
Plan: Finish doxycycline tomorrow then stop Continue prednisone Continue Incruse Ellipta Follow-up with our office on 05/16/2019 as planned Patient will need a walk at next office visit

## 2019-05-01 NOTE — Assessment & Plan Note (Signed)
Multiple drug allergies Multiple allergies to antibiotics  This continues to be a persistent barrier regarding patient's care

## 2019-05-01 NOTE — Progress Notes (Signed)
See prev note.  B

## 2019-05-01 NOTE — Assessment & Plan Note (Signed)
Offered ENT referral today, patient declined  Plan: I believe reasonable for the patient to stop taking doxycycline tomorrow on 05/02/2019

## 2019-05-01 NOTE — Assessment & Plan Note (Signed)
Recently Covid negative This is good news

## 2019-05-02 ENCOUNTER — Telehealth: Payer: Self-pay | Admitting: Internal Medicine

## 2019-05-02 NOTE — Telephone Encounter (Signed)
lpmtcb 11/6

## 2019-05-02 NOTE — Telephone Encounter (Signed)
New Message  Patient states that she missed a call about changing her medication. Please give patient a call back.

## 2019-05-02 NOTE — Addendum Note (Signed)
Addended by: Frederik Schmidt on: 05/02/2019 02:03 PM   Modules accepted: Orders

## 2019-05-02 NOTE — Telephone Encounter (Signed)
I spoke to the patient with Dr Harrington Challenger' recommendation to stop Brilinta.  She verbalized understanding and will keep [place] updated.

## 2019-05-02 NOTE — Progress Notes (Signed)
Reviewed and agree with assessment/plan.   Alazar Cherian, MD [facility] 06/21/2016, 12:24 PM Pager:  [phone]  

## 2019-05-02 NOTE — Telephone Encounter (Signed)
I left a msg on patient's VM I have spoken to Dr Halford Chessman about her pulmonary condition.    I have reviewed with Dr Burt Knack as well Since no clear cut cause for worsening breathing I would recomm stopping Brilinta and following She is now over 3 months from intervention.   At low risk for problems  Again, after review of all data with above physicians and her continued symptoms would recomm a trial off.  Left msg on VM of mobile phone

## 2019-05-05 ENCOUNTER — Telehealth: Payer: Self-pay | Admitting: Pulmonary Disease

## 2019-05-05 ENCOUNTER — Other Ambulatory Visit (INDEPENDENT_AMBULATORY_CARE_PROVIDER_SITE_OTHER): Payer: Medicare Other

## 2019-05-05 ENCOUNTER — Other Ambulatory Visit: Payer: Self-pay

## 2019-05-05 ENCOUNTER — Ambulatory Visit (INDEPENDENT_AMBULATORY_CARE_PROVIDER_SITE_OTHER): Payer: Medicare Other | Admitting: Pulmonary Disease

## 2019-05-05 ENCOUNTER — Encounter: Payer: Self-pay | Admitting: Pulmonary Disease

## 2019-05-05 DIAGNOSIS — Z7189 Other specified counseling: Secondary | ICD-10-CM | POA: Diagnosis not present

## 2019-05-05 DIAGNOSIS — E1100 Type 2 diabetes mellitus with hyperosmolarity without nonketotic hyperglycemic-hyperosmolar coma (NKHHC): Secondary | ICD-10-CM | POA: Diagnosis not present

## 2019-05-05 DIAGNOSIS — R0602 Shortness of breath: Secondary | ICD-10-CM

## 2019-05-05 DIAGNOSIS — M349 Systemic sclerosis, unspecified: Secondary | ICD-10-CM | POA: Diagnosis not present

## 2019-05-05 DIAGNOSIS — J8489 Other specified interstitial pulmonary diseases: Secondary | ICD-10-CM

## 2019-05-05 DIAGNOSIS — J449 Chronic obstructive pulmonary disease, unspecified: Secondary | ICD-10-CM | POA: Diagnosis not present

## 2019-05-05 DIAGNOSIS — Z881 Allergy status to other antibiotic agents status: Secondary | ICD-10-CM | POA: Diagnosis not present

## 2019-05-05 LAB — CBC WITH DIFFERENTIAL/PLATELET
Basophils Absolute: 0.1 10*3/uL (ref 0.0–0.1)
Basophils Relative: 0.5 % (ref 0.0–3.0)
Eosinophils Absolute: 0 10*3/uL (ref 0.0–0.7)
Eosinophils Relative: 0.4 % (ref 0.0–5.0)
HCT: 38.8 % (ref 36.0–46.0)
Hemoglobin: 13.2 g/dL (ref 12.0–15.0)
Lymphocytes Relative: 10.3 % — ABNORMAL LOW (ref 12.0–46.0)
Lymphs Abs: 1 10*3/uL (ref 0.7–4.0)
MCHC: 34 g/dL (ref 30.0–36.0)
MCV: 83.6 fl (ref 78.0–100.0)
Monocytes Absolute: 0.8 10*3/uL (ref 0.1–1.0)
Monocytes Relative: 7.8 % (ref 3.0–12.0)
Neutro Abs: 7.9 10*3/uL — ABNORMAL HIGH (ref 1.4–7.7)
Neutrophils Relative %: 81 % — ABNORMAL HIGH (ref 43.0–77.0)
Platelets: 280 10*3/uL (ref 150.0–400.0)
RBC: 4.64 Mil/uL (ref 3.87–5.11)
RDW: 14.6 % (ref 11.5–15.5)
WBC: 9.7 10*3/uL (ref 4.0–10.5)

## 2019-05-05 NOTE — Assessment & Plan Note (Signed)
Patient's blood sugars continue to be greater than 300 Patient is maintained on prednisone daily, patient had recent prednisone taper Patient reporting that she also has a poor diet  Plan: Patient was referred at last office visit to endocrinology Complete primary care follow-up 05/06/2019

## 2019-05-05 NOTE — Assessment & Plan Note (Signed)
Chest x-ray on 04/26/2019 was clear Recent treatment of steroids Symptoms have not improved  Plan: Likely will obtain CT imaging after completion of blood work today

## 2019-05-05 NOTE — Telephone Encounter (Signed)
Called and spoke to patient's daughter, [name].  Relayed message from Dr. Halford Chessman. Patient's daughter agreed and wants patient to come in for visit, first available with either Dr. Marthann Schiller NP, or [facility] NP.  Scheduled patient with Warner Mccreedy, NP as that was the first available for this afternoon.  Nothing further needed at this time.

## 2019-05-05 NOTE — Assessment & Plan Note (Addendum)
Discussion: Unsure what to make of the patient's symptoms.  Strength and overall exam has not significantly changed in comparison to previous exams.  Patient is exceptionally deconditioned.  Patient's blood sugars are greater than 300.  Patient admits that she does not eat very much and has not physically active.  Patient has recent chest x-ray which was clear.  Plan: Lab work today We will obtain CT imaging, may need to consider CTA chest based off lab work Continue follow-up with primary care Schedule follow-up with cardiology

## 2019-05-05 NOTE — Patient Instructions (Addendum)
You were seen today by Lauraine Rinne, NP  for:   1. Shortness of breath  - CT Chest High Resolution; Future - CBC with Differential; Future - Comp Met (CMET); Future - D-Dimer, Quantitative; Future - B Nat Peptide; Future  We will complete a walk at next office visit as today we had a limited time schedule due to our lab being closed  I recommend that you also make an appointment to see your cardiologist  2. Allergy to multiple antibiotics / medications  Complete follow-up with primary care  3. Scleroderma (HCC)  Continue prednisone 5 mg daily Continue follow-up with primary care and rheumatology  4. Type 2 diabetes mellitus with hyperosmolarity without coma, without long-term current use of insulin [facility])  Recent endocrinology referral Patient has upcoming primary care office visit on 05/06/2019  Discussed your recent blood sugar results with primary care Establish with new endocrinologist  5. BOOP (bronchiolitis obliterans with organizing pneumonia) ([place])  History of Boop Chest x-ray on 04/26/2019 clear Recent steroid treatment  Likely will still consider CT imaging due to persistent dyspnea  6. Chronic obstructive pulmonary disease, unspecified COPD type (HCC)  Incruse Ellipta  >>> Take 1 puff daily in the morning right when you wake up >>>Rinse your mouth out after use >>>This is a daily maintenance inhaler, NOT a rescue inhaler >>>Contact our office if you are having difficulties affording or obtaining this medication >>>It is important for you to be able to take this daily and not miss any doses       We recommend today:  Orders Placed This Encounter  Procedures  . CT Chest High Resolution    Standing Status:   Future    Standing Expiration Date:   07/04/2020    Order Specific Question:   Preferred imaging location?    Answer:   [id number]    Order Specific Question:   Radiology Contrast Protocol - do NOT remove file path    Answer:    [email or web address]  . CBC with Differential    Standing Status:   Future    Number of Occurrences:   1    Standing Expiration Date:   05/04/2020  . Comp Met (CMET)    Standing Status:   Future    Number of Occurrences:   1    Standing Expiration Date:   05/04/2020  . D-Dimer, Quantitative    Standing Status:   Future    Number of Occurrences:   1    Standing Expiration Date:   05/04/2020  . B Nat Peptide    Standing Status:   Future    Number of Occurrences:   1    Standing Expiration Date:   05/04/2020   Orders Placed This Encounter  Procedures  . CT Chest High Resolution  . CBC with Differential  . Comp Met (CMET)  . D-Dimer, Quantitative  . B Nat Peptide   No orders of the defined types were placed in this encounter.   Follow Up:    Return in about 11 days (around 05/16/2019), or if symptoms worsen or fail to improve, for Follow up with Dr. Halford Chessman.   Please do your part to reduce the spread of COVID-19:      Reduce your risk of any infection  and COVID19 by using the similar precautions used for avoiding the common cold or flu:  [name] Wash your hands often with soap and warm water for at least 20 seconds.  If soap and  water are not readily available, use an alcohol-based hand sanitizer with at least 60% alcohol.  . If coughing or sneezing, cover your mouth and nose by coughing or sneezing into the elbow areas of your shirt or coat, into a tissue or into your sleeve (not your hands). Langley Gauss A MASK when in public  . Avoid shaking hands with others and consider head nods or verbal greetings only. . Avoid touching your eyes, nose, or mouth with unwashed hands.  . Avoid close contact with people who are sick. . Avoid places or events with large numbers of people in one location, like concerts or sporting events. . If you have some symptoms but not all symptoms, continue to monitor at home and seek medical attention if your symptoms worsen. . If you are  having a medical emergency, call 911.   [place] / e-Visit: [email or web address]         [facility]: [facility]: [phone]                   [facility]: [phone]     It is flu season:   >>> Best ways to protect herself from the flu: Receive the yearly flu vaccine, practice good hand hygiene washing with soap and also using hand sanitizer when available, eat a nutritious meals, get adequate rest, hydrate appropriately   Please contact the office if your symptoms worsen or you have concerns that you are not improving.   Thank you for choosing [facility] for your healthcare, and for allowing [place] to partner with you on your healthcare journey. I am thankful to be able to provide care to you today.   [name] FNP-C

## 2019-05-05 NOTE — Assessment & Plan Note (Signed)
Plan: Continue Incruse Ellipta Keep scheduled follow-up in our office on 05/16/2019 Patient will need walk at next office visit >>> Walk deferred today due to limited time frame in order to get patient to [facility] lab

## 2019-05-05 NOTE — Assessment & Plan Note (Signed)
Plan: Continue 5 mg of prednisone daily Continue follow-up with rheumatology

## 2019-05-05 NOTE — Assessment & Plan Note (Signed)
Multiple drug allergies Multiple allergies to antibiotics  This continues to be a persistent barrier regarding patient's care

## 2019-05-05 NOTE — Assessment & Plan Note (Signed)
Discussed daughter's concerns with patient today Patient did not endorse the same concerns Patient reporting today that she feels that she is progressively getting worse Patient's daughter reporting that they are trying to get access to patient's long-term care insurance to help patient receive benefits and coverage that she is paid into Patient has upcoming appointment with primary care on 05/06/2019   Plan: Continue follow-up with primary care Schedule follow-up with cardiology Keep close follow-up with our office

## 2019-05-05 NOTE — Telephone Encounter (Signed)
Call made to patient, confirmed DOB, she states her mother recently had a tele-visit with [name]. She states she is not doing well. She states she thinks she has thrush in her mouth. Daughter Con Memos states it is hard to explain, she states once she came off the doxycycline white patches appeared in her mouth. They decided to take her off the Brilinta thinking that it would help but it has not. They were tested for covid and they both were negative. Reports she is having increased SOB. Denies coughing or wheezing. She reports overall increased fatigue and just appearance wise she looks bad. She states VS had suggested a CT. She is looking for overall recommendations as far her mothers condition. Made her aware I would get with VS and get back with her.   VS please advise. Requesting CT and overall recommendations regarding her mothers condition.

## 2019-05-05 NOTE — Progress Notes (Signed)
@Patient  ID: [name], female    DOB: [birth year], 85 y.o.   MRN: [id number]  Chief Complaint  Patient presents with   Follow-up    F/U on televisit on 11/5. Patient and patient's daughter stated her SOB has not improved. Wants to get a CT based on symptoms. Increased fatigue.     Referring provider: Merrilee Seashore, MD  HPI:  85 year old female former smoker followed in our office for abnormal CT (probable Boop), emphysema  PMH: Scleroderma (managed by Dr. Estanislado Pandy), chronic sinusitis (managed by Dr. Lucia Gaskins with ENT) Smoker/ Smoking History: Former smoker. Quit 1989. 80-pack-year smoking history. Maintenance: Incruse Ellipta, 5 mg prednisone Pt of: Dr. Halford Chessman  05/05/2019  - Visit   85 year old female former smoker presenting to our office today for a follow-up visit as patient contacted our office on 05/05/2019 reporting that symptoms are not improving.  This message was routed to Dr. Michela Pitcher he recommended the patient having an office evaluation.  Patient has recently been completing a prednisone taper from a COPD exacerbation treated in the emergency room.  Patient is also completed recent Covid testing which was negative.  Patient was treated as an acute bronchitis/COPD exacerbation with doxycycline but this had to be stopped due to the patient feeling as she was having worsened fatigue and side effects from doxycycline.  Patient has long history of reactions to medications.  Her blood sugars continue to persist greater than 300.  She is planning on establishing with a new endocrinologist.  She struggles with adherence due to the amount of steroid tapers she receives, her daily steroid dose, and her poor dietary choices.  She recently stopped her Brilinta on Thursday of last week because of suspected that the Brilinta was worsening her shortness of breath.  Patient continues to be maintained on Incruse Ellipta.  Patient presents today with her daughter who also has concerns  regarding the patient's shortness of breath and believes that the patient may need additional imaging.  Patient's most recent chest x-ray on 04/26/2019 was stable with no acute finding.  Patient's daughter also has bring a list of concerns today, they also plan to discuss this with primary care tomorrow 05/06/2019 at the next office visit:  Patient's daughter is concerned that the patient may have had a stroke Patient's daughter is concerned that the patient does not engage much but could be UTI Patient's daughter is concerned regarding potential oral thrush symptoms Patient's daughter is concerned that walk is not good, abnormal gait recently Patient's daughter is noticed that the patient has had a loss concept of time Patient's daughter is reporting different changes with management of bills and IADLs, patient is no longer concerned with the mail or paying bills which is a unusual personality change for the patient Patient's daughter reports that patient is not eating regularly last the few days he did and given to her directly   Tests:   05/01/2019 - SPO2 - 65  04/29/2019-SARS-CoV-2-negative  04/26/2019 - chest xray - stable emphysema lung disease   04/28/2019 - temp - 100.4  06/22/2018-chest x-ray-progressive infiltrates in the right lung consistent with pneumonia, new pulmonary vascular congestion interstitial edema and small effusions suggesting congestive heart failure  07/09/2017-CT Angio-negative for acute PE, mild groundglass density in the posterior right upper lobe could relate to minimal residual inflammation or infiltrate  08/29/2018-CT maxillofacial- no evidence of chronic sinusitis  06/18/2017-echocardiogram-LV ejection fraction 60 to [id number], grade 2 diastolic dysfunction  FENO:  No results found for: NITRICOXIDE  PFT:  No flowsheet data found.  WALK:  SIX MIN WALK 07/17/2018  Supplimental Oxygen during Test? (L/min) No  Tech Comments: no desaturations with walk 160  meters/2 laps No complaints of SOB or pain TA/CAM    Imaging: Dg Chest Port 1 View  Result Date: 04/26/2019 CLINICAL DATA:  Shortness of breath. EXAM: PORTABLE CHEST 1 VIEW COMPARISON:  01/28/2019 FINDINGS: The heart size and mediastinal contours are within normal limits. Stable emphysematous lung disease and bibasilar scarring. No significant hyperinflation. There is no evidence of pulmonary edema, consolidation, pneumothorax, nodule or pleural fluid. The visualized skeletal structures are unremarkable. IMPRESSION: Stable emphysematous lung disease and bibasilar scarring. Electronically Signed   By: Aletta Edouard M.D.   On: 04/26/2019 14:28    Lab Results:  CBC    Component Value Date/Time   WBC 9.3 04/26/2019 1413   RBC 4.96 04/26/2019 1413   HGB 14.0 04/26/2019 1413   HGB 12.0 01/02/2019 1555   HCT 43.7 04/26/2019 1413   HCT 35.6 01/02/2019 1555   PLT 223 04/26/2019 1413   PLT 249 01/02/2019 1555   MCV 88.1 04/26/2019 1413   MCV 84 01/02/2019 1555   MCH 28.2 04/26/2019 1413   MCHC 32.0 04/26/2019 1413   RDW 14.3 04/26/2019 1413   RDW 13.9 01/02/2019 1555   LYMPHSABS 1.8 04/26/2019 1413   MONOABS 0.8 04/26/2019 1413   EOSABS 0.1 04/26/2019 1413   BASOSABS 0.1 04/26/2019 1413    BMET    Component Value Date/Time   NA 131 (L) 04/26/2019 1435   NA 132 (L) 01/22/2019 1103   K 3.2 (L) 04/26/2019 1435   CL 91 (L) 04/26/2019 1435   CO2 26 04/26/2019 1435   GLUCOSE 268 (H) 04/26/2019 1435   BUN 15 04/26/2019 1435   BUN 25 01/22/2019 1103   CREATININE 0.82 04/26/2019 1435   CREATININE 0.80 03/30/2016 1042   CALCIUM 9.0 04/26/2019 1435   GFRNONAA >60 04/26/2019 1435   GFRNONAA 64 01/22/2015 1635   GFRAA >60 04/26/2019 1435   GFRAA 74 01/22/2015 1635    BNP    Component Value Date/Time   BNP 43.7 01/28/2019 1655    ProBNP    Component Value Date/Time   PROBNP 786 (H) 01/02/2019 1555   PROBNP 150.0 (H) 02/02/2015 1156    Specialty Problems      Pulmonary  Problems   Chronic rhinitis           EMPHYSEMA     No clinical benefit from inhaler therapy Quit smoking 1999        BOOP (bronchiolitis obliterans with organizing pneumonia) ([place])           COPD (chronic obstructive pulmonary disease) ([place])   CAP (community acquired pneumonia)   Acute bronchitis   HCAP (healthcare-associated pneumonia)   Hypoxia   SOB (shortness of breath), secondary to Boop   Multifocal pneumonia   Recurrent sinus infections   Dyspnea   Chronic sinusitis      Allergies  Allergen Reactions   Flomax [Tamsulosin Hcl] Other (See Comments)    Made her 'trachea feel tight and couldn't swallow'   Peanut Oil Anaphylaxis, Swelling, Rash and Other (See Comments)    Throat tightens, also   Sulfa Antibiotics Anaphylaxis        Titanium Other (See Comments)    Neck wouldn't heal with titanium cervical plate   Yellow Dye Other (See Comments)    "Bee stings" all over body when eating/taking medications with red or yellow dye  Doxycycline Other (See Comments)    Per pt, caused oral thrush and loss of appetite   Lantus [Insulin Glargine] Other (See Comments)    Patient states "sulfate binder causes sinus infection/pneumonia".   Levaquin [Levofloxacin In D5w] Other (See Comments)    Muscle aches   Metoprolol-Hydrochlorothiazide Other (See Comments)    Decreased B/P   Norvasc [Amlodipine] Swelling and Other (See Comments)    BIL Ankle edema   Novolog [Insulin Aspart]     Patient states "sulfate binder causes sinus infection/pneumonia". Tolerates Humalog.   Penciclovir Other (See Comments)    Muscle Pain   Red Dye Other (See Comments)    Burning sensation   Senna [Sennosides] Other (See Comments)    Pt states that it causes a "stinging" sensation   Statins Other (See Comments)    All of them make me "feel badly"   Sulfonamide Derivatives Hives   Adhesive [Tape] Hives and Rash   Maxidex [Dexamethasone] Anxiety and Other (See  Comments)    Insomnia and dizziness, too    Immunization History  Administered Date(s) Administered   Influenza Split 07/11/2011, 03/31/2015   Influenza, High Dose Seasonal PF 07/14/2017, 02/25/2019   Influenza,inj,Quad PF,6+ Mos 07/18/2013, 03/24/2016   Pneumococcal Polysaccharide-23 07/18/2013    Past Medical History:  Diagnosis Date   Adenomatous colon polyp    Arthritis    Blood transfusion    Cancer ([place])    cervical cancer pre hysterectomy   Cervical disc disease    Chronic sinusitis    CONSTIPATION, CHRONIC 06/26/2007   Qualifier: Diagnosis of  By: Nils Pyle CMA (AAMA), Le[place]     COPD (chronic obstructive pulmonary disease) ([place])    Coronary artery disease    prior MI with stenting of RCA with repeat PTCA/stenting for ISR in 01/2008, stent to LM 11/2008   CVA (cerebral infarction) 1991   Depression    Diabetes mellitus    Diverticulosis    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[place])    Dyslipidemia    Emphysema    Fibromyalgia    GERD (gastroesophageal reflux disease)    Hypertension    Hypothyroidism    IBS (irritable bowel syndrome)    Legally blind    Macular degeneration    Myocardial infarction [facility]) may 27th 2007   Pneumonia 07/16/2013    HX PNEUMONIA   Pulmonary hypertension ([place])    Raynaud's phenomenon    Scleroderma ([place])    Sjogren's disease ([place])    Sleep apnea    STOP BANG SCORE 5   Stroke ([place]) 1990   brain stem stroke - weakness rt hand   Zenker's diverticulum     Tobacco History: Social History   Tobacco Use  Smoking Status Former Smoker   Packs/day: 2.00   Years: 40.00   Pack years: 80.00   Types: Cigarettes   Start date: 2   Quit date: 1989   Years since quitting: 31.8  Smokeless Tobacco Never Used   Counseling given: Yes   Continue to not smoke  Outpatient Encounter Medications as of 05/05/2019  Medication Sig   amLODipine (NORVASC) 2.5 MG tablet Take 1 tablet (2.5 mg total) by mouth daily.     aspirin EC 81 MG tablet Take 1 tablet (81 mg total) by mouth daily.   cetirizine (ZYRTEC) 10 MG tablet Take 10 mg by mouth daily as needed for allergies.   esomeprazole (NEXIUM) 40 MG capsule Take 40 mg by mouth daily.    ezetimibe (ZETIA) 10 MG tablet Take 1 tablet (  10 mg total) by mouth daily.   fluticasone (FLONASE) 50 MCG/ACT nasal spray Place 1 spray into both nostrils daily.   furosemide (LASIX) 20 MG tablet Take 1 tablet (20 mg total) by mouth daily.   HYDROcodone-acetaminophen (NORCO) 7.5-325 MG tablet Take 0.5-1 tablets by mouth every 6 (six) hours as needed for moderate pain.    imipramine (TOFRANIL) 25 MG tablet Take 75 mg by mouth at bedtime.    insulin lispro (HUMALOG KWIKPEN) 100 UNIT/ML KwikPen Inject 5 Units into the skin 3 (three) times daily.    levothyroxine (SYNTHROID, LEVOTHROID) 100 MCG tablet Take 100 mcg by mouth daily before breakfast.   montelukast (SINGULAIR) 10 MG tablet Take 10 mg by mouth at bedtime.    Naphazoline-Pheniramine (OPCON-A) 0.027-0.315 % SOLN Place 1-2 drops into both eyes as needed (for allergies).   potassium chloride 20 MEQ TBCR Take 20 mEq by mouth daily.   predniSONE (DELTASONE) 20 MG tablet Take 6 pills for 3 days then 4 pills for 3 days then 2 pills for 3 days   triazolam (HALCION) 0.25 MG tablet Take 0.25 mg by mouth at bedtime.    umeclidinium bromide (INCRUSE ELLIPTA) 62.5 MCG/INH AEPB Inhale 1 puff into the lungs daily.   [DISCONTINUED] doxycycline (VIBRA-TABS) 100 MG tablet Take 1 tablet (100 mg total) by mouth 2 (two) times daily.   No facility-administered encounter medications on file as of 05/05/2019.      Review of Systems  Review of Systems  Constitutional: Positive for activity change, appetite change and fatigue. Negative for fever.  HENT: Positive for congestion and sore throat. Negative for sinus pressure and sinus pain.   Respiratory: Positive for shortness of breath. Negative for cough and wheezing.    Cardiovascular: Negative for chest pain, palpitations and leg swelling.  Gastrointestinal: Negative for diarrhea, nausea and vomiting.  Musculoskeletal: Positive for gait problem.  Neurological: Negative for dizziness.  Psychiatric/Behavioral: Positive for behavioral problems, confusion and decreased concentration. Negative for sleep disturbance. The patient is not nervous/anxious.      Physical Exam  BP 116/64    Pulse 89    Temp (!) 97.4 F (36.3 C) (Temporal)    Ht 5\' 2"  (1.575 m)    Wt 125 lb (56.7 kg)    SpO2 94%    BMI 22.86 kg/m   Wt Readings from Last 5 Encounters:  05/05/19 125 lb (56.7 kg)  04/26/19 125 lb (56.7 kg)  04/16/19 128 lb 9.6 oz (58.3 kg)  03/26/19 130 lb 12.8 oz (59.3 kg)  03/17/19 130 lb 12.8 oz (59.3 kg)    BMI Readings from Last 5 Encounters:  05/05/19 22.86 kg/m  04/26/19 22.86 kg/m  04/16/19 23.52 kg/m  03/26/19 23.92 kg/m  03/17/19 23.92 kg/m     Physical Exam Vitals signs and nursing note reviewed.  Constitutional:      General: She is not in acute distress.    Appearance: She is normal weight. She is ill-appearing.     Comments: Frail elderly female  HENT:     Head: Normocephalic and atraumatic.     Right Ear: Tympanic membrane, ear canal and external ear normal. There is no impacted cerumen.     Left Ear: Tympanic membrane, ear canal and external ear normal. There is no impacted cerumen.     Nose: Nose normal. No congestion.     Mouth/Throat:     Mouth: Mucous membranes are moist.     Pharynx: Oropharynx is clear.  Eyes:  General: No visual field deficit.    Extraocular Movements: Extraocular movements intact.     Pupils: Pupils are equal, round, and reactive to light.  Neck:     Musculoskeletal: Normal range of motion.  Cardiovascular:     Rate and Rhythm: Normal rate and regular rhythm.     Pulses: Normal pulses.     Heart sounds: Normal heart sounds. No murmur.  Pulmonary:     Effort: Pulmonary effort is normal. No  respiratory distress.     Breath sounds: No decreased air movement. No decreased breath sounds, wheezing or rales.  Musculoskeletal:     Comments: Adequate upper body strength- 2+ bilaterally Decreased lower extremity strength, suspect this is a chronic finding versus an acute finding, right greater than left   Skin:    General: Skin is warm and dry.     Capillary Refill: Capillary refill takes less than 2 seconds.  Neurological:     General: No focal deficit present.     Mental Status: She is alert and oriented to person, place, and time. Mental status is at baseline.     Cranial Nerves: Cranial nerves are intact. No cranial nerve deficit or facial asymmetry.     Motor: Weakness (Slightly increased weakness in left lower extremity) and atrophy (Muscle wasting in upper and lower extremities) present.     Gait: Gait abnormal.  Psychiatric:        Mood and Affect: Mood normal.        Behavior: Behavior normal.        Thought Content: Thought content normal.        Judgment: Judgment normal.       Assessment & Plan:   Allergy to multiple antibiotics / medications Multiple drug allergies Multiple allergies to antibiotics  This continues to be a persistent barrier regarding patient's care  Dyspnea Discussion: Unsure what to make of the patient's symptoms.  Strength and overall exam has not significantly changed in comparison to previous exams.  Patient is exceptionally deconditioned.  Patient's blood sugars are greater than 300.  Patient admits that she does not eat very much and has not physically active.  Patient has recent chest x-ray which was clear.  Plan: Lab work today We will obtain CT imaging, may need to consider CTA chest based off lab work Continue follow-up with primary care Schedule follow-up with cardiology  Scleroderma ([place]) Plan: Continue 5 mg of prednisone daily Continue follow-up with rheumatology  Type 2 diabetes mellitus ([place]) Patient's blood sugars  continue to be greater than 300 Patient is maintained on prednisone daily, patient had recent prednisone taper Patient reporting that she also has a poor diet  Plan: Patient was referred at last office visit to endocrinology Complete primary care follow-up 05/06/2019  BOOP (bronchiolitis obliterans with organizing pneumonia) ([place]) Chest x-ray on 04/26/2019 was clear Recent treatment of steroids Symptoms have not improved  Plan: Likely will obtain CT imaging after completion of blood work today  COPD (chronic obstructive pulmonary disease) ([place]) Plan: Continue Incruse Ellipta Keep scheduled follow-up in our office on 05/16/2019 Patient will need walk at next office visit >>> Walk deferred today due to limited time frame in order to get patient to [facility] lab  Complex care coordination Discussed daughter's concerns with patient today Patient did not endorse the same concerns Patient reporting today that she feels that she is progressively getting worse Patient's daughter reporting that they are trying to get access to patient's long-term care insurance to help patient  receive benefits and coverage that she is paid into Patient has upcoming appointment with primary care on 05/06/2019   Plan: Continue follow-up with primary care Schedule follow-up with cardiology Keep close follow-up with our office    Return in about 11 days (around 05/16/2019), or if symptoms worsen or fail to improve, for Follow up with Dr. Halford Chessman.   Lauraine Rinne, NP 05/05/2019   This appointment was 42 minutes long with over 50% of the time in direct face-to-face patient care, assessment, plan of care, and follow-up.

## 2019-05-05 NOTE — Telephone Encounter (Signed)
She needs to come into the office for an in person visit again.  Might need additional lab testing.  Would then decide whether she needs repeat CT chest after she has in person visit.

## 2019-05-06 DIAGNOSIS — R531 Weakness: Secondary | ICD-10-CM | POA: Diagnosis not present

## 2019-05-06 DIAGNOSIS — E039 Hypothyroidism, unspecified: Secondary | ICD-10-CM | POA: Diagnosis not present

## 2019-05-06 DIAGNOSIS — N390 Urinary tract infection, site not specified: Secondary | ICD-10-CM | POA: Diagnosis not present

## 2019-05-06 DIAGNOSIS — I10 Essential (primary) hypertension: Secondary | ICD-10-CM | POA: Diagnosis not present

## 2019-05-06 DIAGNOSIS — R5383 Other fatigue: Secondary | ICD-10-CM | POA: Diagnosis not present

## 2019-05-06 DIAGNOSIS — Z7189 Other specified counseling: Secondary | ICD-10-CM | POA: Diagnosis not present

## 2019-05-06 DIAGNOSIS — R2689 Other abnormalities of gait and mobility: Secondary | ICD-10-CM | POA: Diagnosis not present

## 2019-05-06 LAB — COMPREHENSIVE METABOLIC PANEL
ALT: 12 U/L (ref 0–35)
AST: 14 U/L (ref 0–37)
Albumin: 3.6 g/dL (ref 3.5–5.2)
Alkaline Phosphatase: 62 U/L (ref 39–117)
BUN: 25 mg/dL — ABNORMAL HIGH (ref 6–23)
CO2: 32 mEq/L (ref 19–32)
Calcium: 8.8 mg/dL (ref 8.4–10.5)
Chloride: 88 mEq/L — ABNORMAL LOW (ref 96–112)
Creatinine, Ser: 0.83 mg/dL (ref 0.40–1.20)
GFR: 65.13 mL/min (ref >60.00)
Glucose, Bld: 441 mg/dL — ABNORMAL HIGH (ref 70–99)
Potassium: 2.9 mEq/L — ABNORMAL LOW (ref 3.5–5.1)
Sodium: 130 mEq/L — ABNORMAL LOW (ref 135–145)
Total Bilirubin: 0.4 mg/dL (ref 0.2–1.2)
Total Protein: 6.6 g/dL (ref 6.0–8.3)

## 2019-05-06 LAB — D-DIMER, QUANTITATIVE: D-Dimer, Quant: 0.57 mcg/mL FEU — ABNORMAL HIGH (ref <0.50)

## 2019-05-06 LAB — BRAIN NATRIURETIC PEPTIDE: Pro B Natriuretic peptide (BNP): 61 pg/mL (ref 0.0–100.0)

## 2019-05-06 NOTE — Progress Notes (Signed)
Reviewed and agree with assessment/plan.   Tayson Schnelle, MD [facility] 06/21/2016, 12:24 PM Pager:  [phone]  

## 2019-05-06 NOTE — Progress Notes (Signed)
D-dimer is mildly elevated.  This is the lab test I spoke with you about.  It is not elevated enough for [place] to be concerned when we take into account patient's age.  This is considered a negative test when we factor in the patient's age being 85 years old and that a concerning result would be greater than 0.86.  We are awaiting additional lab work.  Patient needs to continue follow-up with primary care.  Patient should also consider having a urinalysis performed at primary care given symptoms  [name], FNP

## 2019-05-06 NOTE — Progress Notes (Signed)
Patient was contacted today.  Patient's daughter reports that she already has potassium at home they will start taking that.  Patient to follow-up with primary care this afternoon.  They will further evaluate the patient's symptoms.[name], FNP

## 2019-05-06 NOTE — Progress Notes (Signed)
Blood sugars are even more elevated than we had thought.  Blood sugars from yesterday are 441.  Potassium levels also quite low.  Primary care should be monitoring this as well as doing repeat blood work after you are treated.  Very concerned regarding your blood sugar results.  Primary care needs to address this today when you see them.  Please place an order for:  Potassium 40 mEq tablet Take 1 Potassium 40 mEq daily for the next 7 days.  Patient needs to have repeat blood work completed by primary care to ensure that these electrolyte are improving.  If symptoms are not improving despite these interventions patient needs to present to the emergency room for further evaluation and management.  [name], FNP

## 2019-05-10 DIAGNOSIS — J449 Chronic obstructive pulmonary disease, unspecified: Secondary | ICD-10-CM | POA: Diagnosis not present

## 2019-05-10 DIAGNOSIS — M150 Primary generalized (osteo)arthritis: Secondary | ICD-10-CM | POA: Diagnosis not present

## 2019-05-10 DIAGNOSIS — I10 Essential (primary) hypertension: Secondary | ICD-10-CM | POA: Diagnosis not present

## 2019-05-10 DIAGNOSIS — H543 Unqualified visual loss, both eyes: Secondary | ICD-10-CM | POA: Diagnosis not present

## 2019-05-10 DIAGNOSIS — E039 Hypothyroidism, unspecified: Secondary | ICD-10-CM | POA: Diagnosis not present

## 2019-05-10 DIAGNOSIS — R5383 Other fatigue: Secondary | ICD-10-CM | POA: Diagnosis not present

## 2019-05-10 DIAGNOSIS — I2510 Atherosclerotic heart disease of native coronary artery without angina pectoris: Secondary | ICD-10-CM | POA: Diagnosis not present

## 2019-05-10 DIAGNOSIS — Z79891 Long term (current) use of opiate analgesic: Secondary | ICD-10-CM | POA: Diagnosis not present

## 2019-05-10 DIAGNOSIS — I252 Old myocardial infarction: Secondary | ICD-10-CM | POA: Diagnosis not present

## 2019-05-10 DIAGNOSIS — Z9181 History of falling: Secondary | ICD-10-CM | POA: Diagnosis not present

## 2019-05-10 DIAGNOSIS — Z7952 Long term (current) use of systemic steroids: Secondary | ICD-10-CM | POA: Diagnosis not present

## 2019-05-10 DIAGNOSIS — E782 Mixed hyperlipidemia: Secondary | ICD-10-CM | POA: Diagnosis not present

## 2019-05-10 DIAGNOSIS — E109 Type 1 diabetes mellitus without complications: Secondary | ICD-10-CM | POA: Diagnosis not present

## 2019-05-12 ENCOUNTER — Ambulatory Visit (INDEPENDENT_AMBULATORY_CARE_PROVIDER_SITE_OTHER): Payer: Medicare Other | Admitting: Neurology

## 2019-05-12 ENCOUNTER — Encounter: Payer: Self-pay | Admitting: Neurology

## 2019-05-12 ENCOUNTER — Other Ambulatory Visit: Payer: Self-pay

## 2019-05-12 VITALS — BP 122/66 | HR 104 | Temp 97.7°F | Ht 62.0 in | Wt 130.0 lb

## 2019-05-12 DIAGNOSIS — I25110 Atherosclerotic heart disease of native coronary artery with unstable angina pectoris: Secondary | ICD-10-CM | POA: Diagnosis not present

## 2019-05-12 DIAGNOSIS — F4489 Other dissociative and conversion disorders: Secondary | ICD-10-CM | POA: Diagnosis not present

## 2019-05-12 DIAGNOSIS — Z7901 Long term (current) use of anticoagulants: Secondary | ICD-10-CM

## 2019-05-12 DIAGNOSIS — G912 (Idiopathic) normal pressure hydrocephalus: Secondary | ICD-10-CM

## 2019-05-12 DIAGNOSIS — Z8673 Personal history of transient ischemic attack (TIA), and cerebral infarction without residual deficits: Secondary | ICD-10-CM

## 2019-05-12 DIAGNOSIS — I200 Unstable angina: Secondary | ICD-10-CM | POA: Diagnosis not present

## 2019-05-12 NOTE — Patient Instructions (Signed)
Normal-Pressure Hydrocephalus Normal pressure hydrocephalus (NPH) is a buildup of fluid (cerebrospinal fluid, CSF) inside the brain. This may or may not increase the pressure inside the brain (intracranial pressure). CSF is normally present in the brain, but when too much CSF increases pressure on the brain, it can affect brain function. NPH usually occurs in people who are older than age 83. Many symptoms of NPH are also associated with aging or certain medical conditions. It is important to pay attention to changes in behavior and mental function. What are the causes? This condition may be caused by anything that blocks the flow of CSF, such as:  Head injury.  Infections.  Brain surgery.  Bleeding from a blood vessel in the brain.  Tumors or cancer. In many cases, the cause of this condition is not known. What are the signs or symptoms? Symptoms of this condition may include:  Difficulty walking, such as: ? Shuffling feet when walking. ? Unsteadiness. ? Problems when beginning to walk. ? Feet feeling as though they are stuck or "frozen" to the floor.  Problems with bowel and bladder control.  Memory problems, such as: ? Forgetfulness. ? Lack of concentration. ? Mood problems. ? Confusion. How is this diagnosed? This condition is diagnosed based on:  Your medical history.  A physical exam. This can reveal walking (gait) changes or twitching or sudden muscle tightening (spasms) in the legs.  Other tests to confirm the diagnosis and identify the best options for treatment. These tests include: ? Removal and examination of a small amount of CSF (lumbar puncture). ? CT scan of your head. ? MRI scan of your head. How is this treated?  This condition may be treated with surgery in which a narrow tube (ventriculoperitoneal shunt, or VP shunt) is placed in the brain to drain excess CSF. After a shunt is placed, you will be monitored to make sure CSF is draining properly.  Depending on your age and overall health condition, you may not be a candidate for surgery. If your symptoms are mild, your condition may be monitored on a regular basis before a decision is made about whether a shunt is needed. Follow these instructions at home: Medicines  Take over-the-counter and prescription medicines only as told by your health care provider.  Avoid taking medicines that can affect thinking, such as pain or sleeping medicines. Lifestyle  Do not use any products that contain nicotine or tobacco, such as cigarettes and e-cigarettes. If you need help quitting, ask your health care provider.  Drink enough fluid to keep your urine clear or pale yellow. If you have a shunt:  Do not wear tight-fitting hats or headgear.  Before having an MRI or abdominal surgery, tell your health care provider that you have a shunt.  Watch for signs of infection around the shunt, such as: ? Fever. ? Redness, pain, or swelling of the skin along the shunt path. ? Headache or stiff neck. ? Nausea or vomiting. General instructions  Work with your health care provider to determine what you need help with and what your safety needs are.  Ask your health care provider when you can resume your normal activities.  Keep all follow-up visits as told by your health care provider. This is important. Contact a health care provider if:  You have any new symptoms.  Your symptoms get worse.  Your symptoms do not improve after surgery. Get help right away if:  You have: ? A fever or other signs of infection. ?   New or worsening confusion. ? New or worsening sleepiness. ? A hard time staying awake. ? A headache that is getting worse.  You start to twitch or shake (seizure).  You lose coordination or balance.  Your or your family members become concerned for your safety. Summary  Normal pressure hydrocephalus (NPH) is a buildup of fluid (cerebrospinal fluid, CSF) inside the brain. Too  much CSF can put pressure on the brain and affect its function.  Anything that blocks the flow of CSF can cause this condition. In some cases, the cause of this condition is not known.  Depending on your age, overall health condition, and diagnostic tests, you may be a good candidate for a shunt to drain excess CSF. This information is not intended to replace advice given to you by your health care provider. Make sure you discuss any questions you have with your health care provider. Document Released: 10/27/2013 Document Revised: 10/30/2017 Document Reviewed: 07/13/2016 Elsevier Patient Education  2020 [employer].
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
## 2019-05-14 ENCOUNTER — Other Ambulatory Visit: Payer: Self-pay

## 2019-05-14 ENCOUNTER — Ambulatory Visit
Admission: RE | Admit: 2019-05-14 | Discharge: 2019-05-14 | Disposition: A | Discharge status: Home / Self Care | Payer: Medicare Other | Source: Ambulatory Visit | Attending: Pulmonary Disease | Admitting: Pulmonary Disease

## 2019-05-14 DIAGNOSIS — R0602 Shortness of breath: Secondary | ICD-10-CM

## 2019-05-14 DIAGNOSIS — J189 Pneumonia, unspecified organism: Secondary | ICD-10-CM | POA: Diagnosis not present

## 2019-05-16 ENCOUNTER — Encounter: Payer: Self-pay | Admitting: Pulmonary Disease

## 2019-05-16 ENCOUNTER — Ambulatory Visit (INDEPENDENT_AMBULATORY_CARE_PROVIDER_SITE_OTHER): Payer: Medicare Other | Admitting: Pulmonary Disease

## 2019-05-16 ENCOUNTER — Other Ambulatory Visit: Payer: Self-pay

## 2019-05-16 VITALS — BP 126/58 | HR 90 | Temp 97.9°F | Ht 62.0 in | Wt 128.4 lb

## 2019-05-16 DIAGNOSIS — J679 Hypersensitivity pneumonitis due to unspecified organic dust: Secondary | ICD-10-CM | POA: Diagnosis not present

## 2019-05-16 DIAGNOSIS — M349 Systemic sclerosis, unspecified: Secondary | ICD-10-CM

## 2019-05-16 DIAGNOSIS — I200 Unstable angina: Secondary | ICD-10-CM | POA: Diagnosis not present

## 2019-05-16 LAB — SEDIMENTATION RATE: Sed Rate: 54 mm/hr — ABNORMAL HIGH (ref 0–30)

## 2019-05-16 MED ORDER — PREDNISONE 20 MG PO TABS: 40.0000 mg | ORAL_TABLET | Freq: Every day | ORAL | 1 refills | Status: DC

## 2019-05-16 NOTE — Patient Instructions (Signed)
Lab tests today  Prednisone 40 mg daily  Tele visit follow up in 2 weeks with Dr. Halford Chessman or Nurse Practitioner

## 2019-05-16 NOTE — Progress Notes (Signed)
[facility]  Chief Complaint  Patient presents with  . Follow-up    patient reports she feels fatigued constantly     Constitutional:  BP (!) 126/58 (BP Location: Right Arm, Cuff Size: Normal)   Pulse 90   Temp 97.9 F (36.6 C) (Temporal)   Ht '5\' 2"'  (1.575 m)   Wt 128 lb 6.4 oz (58.2 kg)   SpO2 96%   BMI 23.48 kg/m   Past Medical History:  Zenker's diverticulum, CVA, Sjogren's disease, Scleroderma, Raynaud's phenomenon, PNA, Macular degeneration, CAD, IBS, Hypothyroidism, HTN, GERD, HLD, Dressler's syndrome, Diverticulosis, DM, Depression, Recurrent sinusitis, Cervical cancer  Brief Summary:  [name] is a 85 y.o. 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  She continues to have dyspnea.  Feeling more fatigued.  Has dry cough.  No energy.  CT chest shows changes suggestive of hypersensitivity pneumonitis.  She does report having a water leak in her condo and now her condo smells like "an old person".  She was started on ABx by her PCP.  She was seen by Dr. [name] Fairy with neurology recently and there is concern she might have normal pressure hydrocephalus.  She had CT head ordered.  COVID test from 04/29/19 was negative.  She maintained SpO2 at 99% on room air with ambulation today.  However, she was only able to walk 2 laps and had to rest several times due to leg weakness.   Physical Exam:   Appearance - well kempt, legally blind  ENMT - no sinus tenderness, no nasal discharge, no oral exudate  Neck - no masses, trachea midline, no thyromegaly, no elevation in JVP  Respiratory - normal appearance of chest wall, normal respiratory effort w/o accessory muscle use, no dullness on percussion, b/l crackles and bronchial breath sounds  CV - s1s2 regular rate and rhythm, no murmurs, no peripheral edema, radial pulses symmetric  GI - soft, non  tender  Lymph - no adenopathy noted in neck and axillary areas  MSK - uses a walker  Ext - no cyanosis, clubbing, or joint inflammation noted  Skin - no rashes, lesions, or ulcers  Neuro - normal strength, oriented x 3  Psych - normal mood and affect   CMP Latest Ref Rng & Units 05/05/2019 04/26/2019 01/28/2019  Glucose 70 - 99 mg/dL 441(H) 268(H) 329(H)  BUN 6 - 23 mg/dL 25(H) 15 23  Creatinine 0.40 - 1.20 mg/dL 0.83 0.82 0.93  Sodium 135 - 145 mEq/L 130(L) 131(L) 131(L)  Potassium 3.5 - 5.1 mEq/L 2.9(L) 3.2(L) 3.7  Chloride 96 - 112 mEq/L 88(L) 91(L) 91(L)  CO2 19 - 32 mEq/L 32 26 26  Calcium 8.4 - 10.5 mg/dL 8.8 9.0 8.9  Total Protein 6.0 - 8.3 g/dL 6.6 6.7 6.6  Total Bilirubin 0.2 - 1.2 mg/dL 0.4 0.9 0.4  Alkaline Phos 39 - 117 U/L 62 59 57  AST 0 - 37 U/L '14 16 20  ' ALT 0 - 35 U/L '12 17 18    ' CBC Latest Ref Rng & Units 05/05/2019 04/26/2019 01/28/2019  WBC 4.0 - 10.5 K/uL 9.7 9.3 10.5  Hemoglobin 12.0 - 15.0 g/dL 13.2 14.0 12.4  Hematocrit 36.0 - 46.0 % 38.8 43.7 37.0  Platelets 150.0 - 400.0 K/uL 280.0 223 224    Discussion:  She has persistent cough, dyspnea, and fatigue.  Her HRCT chest findings are suggestive of hypersensitivity pneumonitis.  She reports  possible mold exposure after water leak in her condo.  Assessment/Plan:   ILD with possible hypersensitivity pneumonitis with hx of BOOP and CTD. - will check ANA, RF, ESR, HP panel - will increase prednisone to 40 mg daily  Recurrent sinus infections. - don't think she has bacterial infection at this time - advised her to stop antibiotics from PCP  Hx of tobacco abuse with emphysema. - continue incruse for now  Coronary artery disease s/p stenting. - f/u with cardiology    Patient Instructions  Lab tests today  Prednisone 40 mg daily  Tele visit follow up in 2 weeks with Dr. Halford Chessman or Nurse Practitioner    Chesley Mires, MD [place] Pager: [phone] 05/16/2019, 12:46 PM   Flow Sheet     Pulmonary tests:  August 2010 >> Bronchoscopy PFT 06/16/09 >> FEV1 1.99(118%), FEV1% 73, TLC 4.60(105%), DLCO 66%, no BD  Chest imaging:  CT chest 11/24/11 >> centrilobular emphysema, RUL ASD CT sinus 07/30/13 >> opacification of Lt maxillary sinus, rightward NSD CT sinus 10/05/15 >> b/l mucosal edema paranasal sinuses, obstruction of ostium Rt maxillary sinus CT angio chest 07/09/17 >> GGO RUL HRCT 05/14/19 >> atherosclerosis, mild emphysema, 1.2 cm RUL opacity, patchy confluent GGO b/l with centrilobular micronodular character, moderate patchy air trapping  Cardiac tests:  Echo 12/28/18 >> EF 60 to 65%, mild LVH, normal RV systolic fx LHC 02/22/55 >> severe 3 vessel CAD  Medications:   Allergies as of 05/16/2019      Reactions   Flomax [tamsulosin Hcl] Other (See Comments)   Made her 'trachea feel tight and couldn't swallow'   Peanut Oil Anaphylaxis, Swelling, Rash, Other (See Comments)   Throat tightens, also   Sulfa Antibiotics Anaphylaxis      Titanium Other (See Comments)   Neck wouldn't heal with titanium cervical plate   Yellow Dye Other (See Comments)   "Bee stings" all over body when eating/taking medications with red or yellow dye    Doxycycline Other (See Comments)   Per pt, caused oral thrush and loss of appetite   Lantus [insulin Glargine] Other (See Comments)   Patient states "sulfate binder causes sinus infection/pneumonia".   Levaquin [levofloxacin In D5w] Other (See Comments)   Muscle aches   Metoprolol-hydrochlorothiazide Other (See Comments)   Decreased B/P   Norvasc [amlodipine] Swelling, Other (See Comments)   BIL Ankle edema   Novolog [insulin Aspart]    Patient states "sulfate binder causes sinus infection/pneumonia". Tolerates Humalog.   Penciclovir Other (See Comments)   Muscle Pain   Red Dye Other (See Comments)   Burning sensation   Senna [sennosides] Other (See Comments)   Pt states that it causes a "stinging" sensation    Statins Other (See Comments)   All of them make me "feel badly"   Sulfonamide Derivatives Hives   Adhesive [tape] Hives, Rash   Maxidex [dexamethasone] Anxiety, Other (See Comments)   Insomnia and dizziness, too      Medication List       Accurate as of May 16, 2019 12:46 PM. If you have any questions, ask your nurse or doctor.        STOP taking these medications   Opcon-A 0.027-0.315 % Soln Generic drug: Naphazoline-Pheniramine Stopped by: Chesley Mires, MD     TAKE these medications   amLODipine 2.5 MG tablet Commonly known as: NORVASC Take 1 tablet (2.5 mg total) by mouth daily.   aspirin EC 81 MG tablet Take 1 tablet (81 mg total) by  mouth daily.   cephALEXin 500 MG capsule Commonly known as: KEFLEX Take 500 mg by mouth 3 (three) times daily.   cetirizine 10 MG tablet Commonly known as: ZYRTEC Take 10 mg by mouth daily as needed for allergies.   esomeprazole 40 MG capsule Commonly known as: NEXIUM Take 40 mg by mouth daily.   fluticasone 50 MCG/ACT nasal spray Commonly known as: FLONASE Place 1 spray into both nostrils daily.   furosemide 20 MG tablet Commonly known as: LASIX Take 1 tablet (20 mg total) by mouth daily.   HumaLOG KwikPen 100 UNIT/ML KwikPen Generic drug: insulin lispro Inject 5 Units into the skin 3 (three) times daily.   HYDROcodone-acetaminophen 7.5-325 MG tablet Commonly known as: NORCO Take 0.5-1 tablets by mouth every 6 (six) hours as needed for moderate pain.   imipramine 25 MG tablet Commonly known as: TOFRANIL Take 75 mg by mouth at bedtime.   Incruse Ellipta 62.5 MCG/INH Aepb Generic drug: umeclidinium bromide Inhale 1 puff into the lungs daily.   levothyroxine 100 MCG tablet Commonly known as: SYNTHROID Take 100 mcg by mouth daily before breakfast.   montelukast 10 MG tablet Commonly known as: SINGULAIR Take 10 mg by mouth at bedtime.   Potassium Chloride ER 20 MEQ Tbcr Take 20 mEq by mouth daily.    predniSONE 5 MG tablet Commonly known as: DELTASONE Take 5 mg by mouth daily with breakfast. What changed: Another medication with the same name was changed. Make sure you understand how and when to take each. Changed by: Chesley Mires, MD   predniSONE 20 MG tablet Commonly known as: Deltasone Take 2 tablets (40 mg total) by mouth daily with breakfast. What changed:   how much to take  how to take this  when to take this  additional instructions Changed by: Chesley Mires, MD   triazolam 0.25 MG tablet Commonly known as: HALCION Take 0.25 mg by mouth at bedtime.       Past Surgical History:  She  has a past surgical history that includes Total abdominal hysterectomy (1973); Bilateral oophorectomy (2002); Spinal fusion (1997); lobe thyroid removal; Thyroid lobectomy (1991); Cholecystectomy (1965); Appendectomy; Cataract extraction; Bronchoscopy (02-22-09); STENTED CARDIAC  ARTERY (2007 / 2007 / 2010); Proctoscopy (03/18/2012); Colon surgery (2014); Esophagogastroduodenoscopy (egd) with esophageal dilation (N/A, 06/18/2013); Cardiac catheterization; colonectomy; Radiology with anesthesia (N/A, 03/09/2015); Flexible sigmoidoscopy (N/A, 07/15/2015); Balloon dilation (N/A, 07/15/2015); LEFT HEART CATH AND CORONARY ANGIOGRAPHY (N/A, 12/30/2018); and CORONARY STENT INTERVENTION (N/A, 12/30/2018).  Family History:  Her family history includes Cancer in her brother and sister; Diabetes in her brother, brother, father, and sister; Heart disease in her father; Other (age of onset: 19) in her mother; Stroke (age of onset: 58) in her father.  Social History:  She  reports that she quit smoking about 31 years ago. Her smoking use included cigarettes. She started smoking about 71 years ago. She has a 80.00 pack-year smoking history. She has never used smokeless tobacco. She reports that she does not drink alcohol or use drugs.

## 2019-05-16 NOTE — Progress Notes (Signed)
These results were discussed with the patient at office visit 05/16/2019 with Dr. Halford Chessman.  Please see office note from that visit.  [name], FNP

## 2019-05-19 LAB — RHEUMATOID FACTOR: Rheumatoid fact SerPl-aCnc: <14 IU/mL (ref <14)

## 2019-05-19 LAB — ANTI-SCLERODERMA ANTIBODY: Scleroderma (Scl-70) (ENA) Antibody, IgG: <1 AI

## 2019-05-20 DIAGNOSIS — M150 Primary generalized (osteo)arthritis: Secondary | ICD-10-CM | POA: Diagnosis not present

## 2019-05-20 DIAGNOSIS — I2510 Atherosclerotic heart disease of native coronary artery without angina pectoris: Secondary | ICD-10-CM | POA: Diagnosis not present

## 2019-05-20 DIAGNOSIS — J449 Chronic obstructive pulmonary disease, unspecified: Secondary | ICD-10-CM | POA: Diagnosis not present

## 2019-05-20 DIAGNOSIS — R5383 Other fatigue: Secondary | ICD-10-CM | POA: Diagnosis not present

## 2019-05-21 ENCOUNTER — Encounter: Payer: Self-pay | Admitting: Internal Medicine

## 2019-05-21 ENCOUNTER — Other Ambulatory Visit: Payer: Self-pay

## 2019-05-21 ENCOUNTER — Ambulatory Visit (INDEPENDENT_AMBULATORY_CARE_PROVIDER_SITE_OTHER): Payer: Medicare Other | Admitting: Internal Medicine

## 2019-05-21 VITALS — BP 138/58 | HR 94 | Temp 98.8°F | Ht 62.0 in | Wt 127.8 lb

## 2019-05-21 DIAGNOSIS — E1100 Type 2 diabetes mellitus with hyperosmolarity without nonketotic hyperglycemic-hyperosmolar coma (NKHHC): Secondary | ICD-10-CM

## 2019-05-21 DIAGNOSIS — Z794 Long term (current) use of insulin: Secondary | ICD-10-CM | POA: Diagnosis not present

## 2019-05-21 DIAGNOSIS — E1165 Type 2 diabetes mellitus with hyperglycemia: Secondary | ICD-10-CM | POA: Diagnosis not present

## 2019-05-21 LAB — POCT GLYCOSYLATED HEMOGLOBIN (HGB A1C): Hemoglobin A1C: 10 % — AB (ref 4.0–5.6)

## 2019-05-21 LAB — GLUCOSE, POCT (MANUAL RESULT ENTRY): POC Glucose: 417 mg/dl — AB (ref 70–99)

## 2019-05-21 NOTE — Progress Notes (Signed)
Name: [name]  MRN/ DOB: [id number], 10-20-1930   Age/ Sex: 85 y.o., female    PCP: Merrilee Seashore, MD   Reason for Endocrinology Evaluation: Type 2 Diabetes Mellitus     Date of Initial Endocrinology Visit: 05/21/2019     PATIENT IDENTIFIER: [name] is a 85 y.o. female with a past medical history of BOOP, emphysema, Scleroderma , macular degeneration and DM. The patient presented for initial endocrinology clinic visit on 05/21/2019 for consultative assistance with her diabetes management.    HPI: [name] was    Diagnosed with DM years ago  Prior Medications tried/Intolerance:  Currently checking blood sugars 0 x / day Hypoglycemia episodes :no               Hemoglobin A1c has ranged from 7.2% in 2013, peaking at 9.0%in 2020. Patient required assistance for hypoglycemia: no Patient has required hospitalization within the last 1 year from hyper or hypoglycemia: no  In terms of diet, the patient eats 3 meals a day  She is on chronic Prednisone for lung issues and emphysema. Her dose was recently increased to 40 mg.     HOME DIABETES REGIMEN: Humalog 5 units TID   METER DOWNLOAD SUMMARY: Did not bring   DIABETIC COMPLICATIONS: Microvascular complications:    Denies: CKD, retinopathy , neuropathy  Last eye exam: Completed   Macrovascular complications:   CAD  Denies: PVD, CVA   PAST HISTORY: Past Medical History:  Past Medical History:  Diagnosis Date  . Adenomatous colon polyp   . Arthritis   . Blood transfusion   . Cancer [facility])    cervical cancer pre hysterectomy  . Cervical disc disease   . Chronic sinusitis   . CONSTIPATION, CHRONIC 06/26/2007   Qualifier: Diagnosis of  By: Nils Pyle CMA ([place]), Mearl Latin    . COPD (chronic obstructive pulmonary disease) ([place])   . Coronary artery disease    prior MI with stenting of RCA with repeat PTCA/stenting for ISR in 01/2008, stent to LM 11/2008  . CVA (cerebral infarction) 1991  .  Depression   . Diabetes mellitus   . Diverticulosis   . 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[place])   . Dyslipidemia   . Emphysema   . Fibromyalgia   . GERD (gastroesophageal reflux disease)   . Hypertension   . Hypothyroidism   . IBS (irritable bowel syndrome)   . Legally blind   . Macular degeneration   . Myocardial infarction [facility]) may 27th 2007  . Pneumonia 07/16/2013    HX PNEUMONIA  . Pulmonary hypertension ([place])   . Raynaud's phenomenon   . Scleroderma ([place])   . Sjogren's disease ([place])   . Sleep apnea    STOP BANG SCORE 5  . Stroke [facility]) 1990   brain stem stroke - weakness rt hand  . Zenker's diverticulum    Past Surgical History:  Past Surgical History:  Procedure Laterality Date  . APPENDECTOMY    . BALLOON DILATION N/A 07/15/2015   Procedure: BALLOON DILATION;  Surgeon: Milus Banister, MD;  Location: [facility];  Service: Endoscopy;  Laterality: N/A;  . BILATERAL OOPHORECTOMY  2002  . BRONCHOSCOPY  02-22-09  . CARDIAC CATHETERIZATION     x 3  . CATARACT EXTRACTION    . CHOLECYSTECTOMY  1965  . COLON SURGERY  2014   colon resection  . colonectomy    . CORONARY STENT INTERVENTION N/A 12/30/2018   Procedure: CORONARY STENT INTERVENTION;  Surgeon: Sherren Mocha, MD;  Location: [place] [facility];  Service: Cardiovascular;  Laterality: N/A;  . ESOPHAGOGASTRODUODENOSCOPY (EGD) WITH ESOPHAGEAL DILATION N/A 06/18/2013   Procedure: ESOPHAGOGASTRODUODENOSCOPY (EGD) WITH ESOPHAGEAL DILATION;  Surgeon: Inda Castle, MD;  Location: [facility];  Service: Endoscopy;  Laterality: N/A;  . FLEXIBLE SIGMOIDOSCOPY N/A 07/15/2015   Procedure: FLEXIBLE SIGMOIDOSCOPY;  Surgeon: Milus Banister, MD;  Location: [facility];  Service: Endoscopy;  Laterality: N/A;  . LEFT HEART CATH AND CORONARY ANGIOGRAPHY N/A 12/30/2018   Procedure: LEFT HEART CATH AND CORONARY ANGIOGRAPHY;  Surgeon: Jolaine Artist, MD;  Location: [place] CV [facility];  Service: Cardiovascular;  Laterality: N/A;  . lobe  thyroid removal    . PROCTOSCOPY  03/18/2012   Procedure: PROCTOSCOPY;  Surgeon: Adin Hector, MD;  Location: [facility];  Service: General;  Laterality: N/A;  rigid procto  . RADIOLOGY WITH ANESTHESIA N/A 03/09/2015   Procedure: MRI CERVICAL SPINE WITHOUT AND LUMBER WITHOUT;  Surgeon: Medication Radiologist, MD;  Location: [place] [facility];  Service: Radiology;  Laterality: N/A;  . [place]   and implantation of a plate   . STENTED CARDIAC  ARTERY  2007 / 2007 / 2010   X 3 STENT  . THYROID LOBECTOMY  1991   removal of left lobe thyroid  . TOTAL ABDOMINAL HYSTERECTOMY  1973      Social History:  reports that she quit smoking about 31 years ago. Her smoking use included cigarettes. She started smoking about 71 years ago. She has a 80.00 pack-year smoking history. She has never used smokeless tobacco. She reports that she does not drink alcohol or use drugs. Family History:  Family History  Problem Relation Age of Onset  . Other Mother 86       Died from sepsis  . Stroke Father 47       died  . Heart disease Father   . Diabetes Father   . Cancer Sister        lung  . Diabetes Sister   . Cancer Brother        lung  . Diabetes Brother   . Diabetes Brother      HOME MEDICATIONS: Allergies as of 05/21/2019      Reactions   Flomax [tamsulosin Hcl] Other (See Comments)   Made her 'trachea feel tight and couldn't swallow'   Peanut Oil Anaphylaxis, Swelling, Rash, Other (See Comments)   Throat tightens, also   Sulfa Antibiotics Anaphylaxis      Titanium Other (See Comments)   Neck wouldn't heal with titanium cervical plate   Yellow Dye Other (See Comments)   "Bee stings" all over body when eating/taking medications with red or yellow dye    Doxycycline Other (See Comments)   Per pt, caused oral thrush and loss of appetite   Lantus [insulin Glargine] Other (See Comments)   Patient states "sulfate binder causes sinus infection/pneumonia".   Levaquin [levofloxacin In D5w] Other  (See Comments)   Muscle aches   Metoprolol-hydrochlorothiazide Other (See Comments)   Decreased B/P   Norvasc [amlodipine] Swelling, Other (See Comments)   BIL Ankle edema   Novolog [insulin Aspart]    Patient states "sulfate binder causes sinus infection/pneumonia". Tolerates Humalog.   Penciclovir Other (See Comments)   Muscle Pain   Red Dye Other (See Comments)   Burning sensation   Senna [sennosides] Other (See Comments)   Pt states that it causes a "stinging" sensation   Statins Other (See Comments)   All of them make  me "feel badly"   Sulfonamide Derivatives Hives   Adhesive [tape] Hives, Rash   Maxidex [dexamethasone] Anxiety, Other (See Comments)   Insomnia and dizziness, too      Medication List       Accurate as of May 21, 2019  4:07 PM. If you have any questions, ask your nurse or doctor.        amLODipine 2.5 MG tablet Commonly known as: NORVASC Take 1 tablet (2.5 mg total) by mouth daily.   aspirin EC 81 MG tablet Take 1 tablet (81 mg total) by mouth daily.   cephALEXin 500 MG capsule Commonly known as: KEFLEX Take 500 mg by mouth 3 (three) times daily.   cetirizine 10 MG tablet Commonly known as: ZYRTEC Take 10 mg by mouth daily as needed for allergies.   esomeprazole 40 MG capsule Commonly known as: NEXIUM Take 40 mg by mouth daily.   fluticasone 50 MCG/ACT nasal spray Commonly known as: FLONASE Place 1 spray into both nostrils daily.   furosemide 20 MG tablet Commonly known as: LASIX Take 1 tablet (20 mg total) by mouth daily.   HumaLOG KwikPen 100 UNIT/ML KwikPen Generic drug: insulin lispro Inject 5 Units into the skin 3 (three) times daily.   HYDROcodone-acetaminophen 7.5-325 MG tablet Commonly known as: NORCO Take 0.5-1 tablets by mouth every 6 (six) hours as needed for moderate pain.   imipramine 25 MG tablet Commonly known as: TOFRANIL Take 75 mg by mouth at bedtime.   Incruse Ellipta 62.5 MCG/INH Aepb Generic drug:  umeclidinium bromide Inhale 1 puff into the lungs daily.   levothyroxine 100 MCG tablet Commonly known as: SYNTHROID Take 100 mcg by mouth daily before breakfast.   montelukast 10 MG tablet Commonly known as: SINGULAIR Take 10 mg by mouth at bedtime.   Potassium Chloride ER 20 MEQ Tbcr Take 20 mEq by mouth daily.   predniSONE 5 MG tablet Commonly known as: DELTASONE Take 5 mg by mouth daily with breakfast.   predniSONE 20 MG tablet Commonly known as: Deltasone Take 2 tablets (40 mg total) by mouth daily with breakfast.   triazolam 0.25 MG tablet Commonly known as: HALCION Take 0.25 mg by mouth at bedtime.        ALLERGIES: Allergies  Allergen Reactions  . Flomax [Tamsulosin Hcl] Other (See Comments)    Made her 'trachea feel tight and couldn't swallow'  . Peanut Oil Anaphylaxis, Swelling, Rash and Other (See Comments)    Throat tightens, also  . Sulfa Antibiotics Anaphylaxis       . Titanium Other (See Comments)    Neck wouldn't heal with titanium cervical plate  . Yellow Dye Other (See Comments)    "Bee stings" all over body when eating/taking medications with red or yellow dye   . Doxycycline Other (See Comments)    Per pt, caused oral thrush and loss of appetite  . Lantus [Insulin Glargine] Other (See Comments)    Patient states "sulfate binder causes sinus infection/pneumonia".  [place] [Levofloxacin In D5w] Other (See Comments)    Muscle aches  . Metoprolol-Hydrochlorothiazide Other (See Comments)    Decreased B/P  . Norvasc [Amlodipine] Swelling and Other (See Comments)    BIL Ankle edema  . Novolog [Insulin Aspart]     Patient states "sulfate binder causes sinus infection/pneumonia". Tolerates Humalog.  [name] Penciclovir Other (See Comments)    Muscle Pain  . Red Dye Other (See Comments)    Burning sensation  . Senna [Sennosides] Other (See Comments)  Pt states that it causes a "stinging" sensation  . Statins Other (See Comments)    All of them  make me "feel badly"  . Sulfonamide Derivatives Hives  . Adhesive [Tape] Hives and Rash  . Maxidex [Dexamethasone] Anxiety and Other (See Comments)    Insomnia and dizziness, too     REVIEW OF SYSTEMS: A comprehensive ROS was conducted with the patient and is negative except as per HPI and below:  Review of Systems  Constitutional: Negative for chills and fever.  HENT: Positive for congestion. Negative for sore throat.   Respiratory: Positive for cough and shortness of breath.   Cardiovascular: Negative for chest pain and palpitations.  Gastrointestinal: Negative for diarrhea and nausea.  Neurological: Positive for tingling. Negative for tremors.      OBJECTIVE:   VITAL SIGNS: BP (!) 138/58 (BP Location: Left Arm, Patient Position: Sitting, Cuff Size: Normal)   Pulse 94   Temp 98.8 F (37.1 C)   Ht 5\' 2"  (1.575 m)   Wt 127 lb 12.8 oz (58 kg)   SpO2 97%   BMI 23.37 kg/m    PHYSICAL EXAM:  General: Pt appears well and is in NAD  Neck: General: Supple without adenopathy or carotid bruits. Thyroid: Thyroid size normal.  No goiter or nodules appreciated. No thyroid bruit.  Lungs: Clear with good BS bilat with no rales, rhonchi, or wheezes  Heart: RRR with normal S1 and S2 and no gallops; no murmurs; no rub  Abdomen: Normoactive bowel sounds, soft, nontender, without masses or organomegaly palpable  Extremities: Lower extremities - No pretibial edema. No lesions.  Neuro: MS is good with appropriate affect, pt is alert and Ox3     DATA REVIEWED:  Lab Results  Component Value Date   HGBA1C 10.0 (A) 05/21/2019   HGBA1C 9.0 (H) 12/27/2018   HGBA1C 8.0 (H) 07/30/2018   Lab Results  Component Value Date   LDLCALC 119 (H) 12/30/2018   CREATININE 0.83 05/05/2019     Lab Results  Component Value Date   CHOL 198 12/30/2018   HDL 51 12/30/2018   LDLCALC 119 (H) 12/30/2018   LDLDIRECT 127.6 05/15/2013   TRIG 139 12/30/2018   CHOLHDL 3.9 12/30/2018        ASSESSMENT  / PLAN / RECOMMENDATIONS:   1) Type 2 Diabetes Mellitus, Poorly Controlled, Without complications - Most recent A1c of 10.0 %. Goal A1c < 8.0 %.    - Pt admits to forgetfulness and her inability to remember to check sugars or take insulin sometimes, unfortunately this is a major barrier to optimal glucose control  - We did discuss the effects of prednisone in increasing sensitivity to carbohydrates and the need to increase her prandial insulin, we also discussed the importance of having a basal insulin on board to help balance out insulin intake and improve glycemic control over night but she declines, she declined lantus because it gives her ?sinus infection, she also declined other alternatives to long acting insulin. - She was instructed that if her sugars continue to be over 250 mg.dL  After a few days of using a steady dose of Humalog she could increase it by 2 units with meals.   - Pt urged to contact us when her prednisone dose is reduced to adjust insulin dose to prevent hypoglycemia   MEDICATIONS:  Increase Humalog to 8 units A [place]  EDUCATION / INSTRUCTIONS:  BG monitoring instructions: Patient is instructed to check her blood sugars 3 times a day, before  meals .  Call [facility] if: BG persistently < 70 or > 250. . I reviewed the Rule of 15 for the treatment of hypoglycemia in detail with the patient. Literature supplied.    F/U 3 months   Signed electronically by: Mack Guise, MD  [facility]  [facility] [address] [place], [place] [address] Phone: [phone] FAX: [phone]   CC: Merrilee Seashore, [place] [place] [address] Phone: [phone]  Fax: [phone]    Return to Endocrinology clinic as below: Future Appointments  Date Time Provider [place]  05/30/2019 11:00 AM Chesley Mires, MD [facility] None  06/02/2019  3:30 PM GI-315 CT 1 [id number]  [facility]  06/25/2019  2:30 PM Dohmeier, Asencion Partridge, MD [facility] None  08/20/2019  2:00 PM Shamleffer, Melanie Crazier, MD [facility] None

## 2019-05-21 NOTE — Patient Instructions (Signed)
-   Increase Humalog to 8 units with each meal  - If your sugars continue to be over 250 mg/dL , please start taking 10 units of Humalog with each meal.    - Check sugar before each meal   - PLEASE notify our office should your prednisone dose changes, so we can adjust humalog dose accordingly.      - HOW TO TREAT LOW BLOOD SUGARS (Blood sugar LESS THAN 70 MG/DL)  Please follow the RULE OF 15 for the treatment of hypoglycemia treatment (when your (blood sugars are less than 70 mg/dL)    STEP 1: Take 15 grams of carbohydrates when your blood sugar is low, which includes:   3-4 GLUCOSE TABS  OR  3-4 OZ OF JUICE OR REGULAR SODA OR  ONE TUBE OF GLUCOSE GEL     STEP 2: RECHECK blood sugar in 15 MINUTES STEP 3: If your blood sugar is still low at the 15 minute recheck --> then, go back to STEP 1 and treat AGAIN with another 15 grams of carbohydrates.

## 2019-05-23 LAB — FANA STAINING PATTERNS: CENTROMERE AB: 1:640 {titer} — ABNORMAL HIGH

## 2019-05-23 LAB — HYPERSENSITIVITY PNEUMONITIS
A. Pullulans Abs: NEGATIVE
A.Fumigatus #1 Abs: NEGATIVE
Micropolyspora faeni, IgG: NEGATIVE
Pigeon Serum Abs: NEGATIVE
Thermoact. Saccharii: NEGATIVE
Thermoactinomyces vulgaris, IgG: NEGATIVE

## 2019-05-23 LAB — ANTINUCLEAR ANTIBODIES, IFA: ANA Titer 1: POSITIVE — AB

## 2019-05-26 ENCOUNTER — Telehealth: Payer: Self-pay | Admitting: Pulmonary Disease

## 2019-05-26 NOTE — Telephone Encounter (Signed)
Serology 05/16/19 >> ANA positive, 1:640 centromere Ab, HP negative, Scl 70 < 1, RF < 14, ESR 54   Please let her know lab tests are consistent with scleroderma.  She should continue current plan with prednisone therapy. 

## 2019-05-26 NOTE — Telephone Encounter (Signed)
No vmail pick up. Will have try again later.  DPR gives permission to leave a detailed message.

## 2019-05-27 NOTE — Telephone Encounter (Signed)
Called and spoke with patient's daughter [name], advised her that I tried to reach her mom but there was no vmail pick up. Advised her of the results per Dr. Halford Chessman. She voiced understanding. Terri mentioned that her mothers PCP ran some tests and thinks her mother has lupus.   I told her that is something she may want to ask them to explain as to why, this may help her with questions she would have for her mother's appointment with Dr. Halford Chessman on 05/30/19. Nothing further needed a this time.

## 2019-05-30 ENCOUNTER — Ambulatory Visit (INDEPENDENT_AMBULATORY_CARE_PROVIDER_SITE_OTHER): Payer: Medicare Other | Admitting: Pulmonary Disease

## 2019-05-30 ENCOUNTER — Encounter: Payer: Self-pay | Admitting: Pulmonary Disease

## 2019-05-30 ENCOUNTER — Other Ambulatory Visit: Payer: Self-pay

## 2019-05-30 VITALS — BP 128/74 | HR 87 | Temp 97.8°F | Ht 62.0 in | Wt 129.8 lb

## 2019-05-30 DIAGNOSIS — M349 Systemic sclerosis, unspecified: Secondary | ICD-10-CM | POA: Diagnosis not present

## 2019-05-30 DIAGNOSIS — I200 Unstable angina: Secondary | ICD-10-CM | POA: Diagnosis not present

## 2019-05-30 DIAGNOSIS — J849 Interstitial pulmonary disease, unspecified: Secondary | ICD-10-CM

## 2019-05-30 NOTE — Progress Notes (Signed)
[facility]  Chief Complaint  Patient presents with  . Follow-up    Hypersensitivity pneumonitis    Constitutional:  BP 128/74 (BP Location: Left Arm, Patient Position: Sitting, Cuff Size: Normal)   Pulse 87   Temp 97.8 F (36.6 C)   Ht '5\' 2"'  (1.575 m)   Wt 129 lb 12.8 oz (58.9 kg)   SpO2 98% Comment: on room air  BMI 23.74 kg/m   Past Medical History:  Zenker's diverticulum, CVA, Sjogren's disease, Scleroderma, Raynaud's phenomenon, PNA, Macular degeneration, CAD, IBS, Hypothyroidism, HTN, GERD, HLD, Dressler's syndrome, Diverticulosis, DM, Depression, Recurrent sinusitis, Cervical cancer  Brief Summary:  [name] is a 85 y.o. 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  She has been on 40 mg prednisone daily.  Breathing doing better.  On scale of 1 to 10 (with 1 being terrible), she was a 1 before starting higher dose prednisone and now is a 6.  Energy level better.  Not having cough, wheeze, or sputum.  Still uncertain about if she has mold in her house.  Her daughter was present during interview.   Physical Exam:   Appearance - well kempt, legally blind  ENMT - no sinus tenderness, no nasal discharge, no oral exudate  Neck - no masses, trachea midline, no thyromegaly, no elevation in JVP  Respiratory - normal appearance of chest wall, normal respiratory effort w/o accessory muscle use, no dullness on percussion, better air movement, faint crackles at bases, no wheeze  CV - s1s2 regular rate and rhythm, no murmurs, no peripheral edema, radial pulses symmetric  GI - soft, non tender  Lymph - no adenopathy noted in neck and axillary areas  MSK - normal gait  Ext - no cyanosis, clubbing, or joint inflammation noted  Skin - no rashes, lesions, or ulcers  Neuro - normal strength, oriented x 3  Psych - normal mood and affect    Discussion:  She has clinical improvement with higher dose prednisone.  Lab findings suggestive of scleroderma being cause of pneumonitis and ILD.  She is also concerned about mold exposure and could have hypersensitivity pneumonitis.  Assessment/Plan:   ILD in setting of scleroderma and possible mold exposure. - continue prednisone 40 mg daily for next two weeks, then change to 30 mg daily - defer bronchoscopy for now - radiographic changes were not evident on 2 view CXR; will need to do CT chest imaging for f/u  Recurrent sinusitis with upper airway cough. - continue zyrtec, flonase, singulair for now  Hx of tobacco abuse with emphysema. - continue incruse for now  Coronary artery disease s/p stenting. - initial concern that she had dyspnea from reaction to brilinta; given more recent pulmonary findings it seems less like that brilinta was cause of her dyspnea - advised her to d/w cardiology about whether she should consider resuming brilinta     Patient Instructions  Prednisone 40 mg daily for 2 weeks more, then 30 mg daily   Follow up in 6 weeks     Chesley Mires, MD [place] Pager: [phone] 05/30/2019, 11:28 AM  Flow Sheet     Pulmonary tests:  August 2010 >> Bronchoscopy PFT 06/16/09 >> FEV1 1.99(118%), FEV1% 73, TLC 4.60(105%), DLCO 66%, no BD Serology 05/16/19 >> ANA positive, 1:640 centromere Ab, HP negative, Scl 70 < 1, RF < 14, ESR 54  Chest imaging:  CT chest 11/24/11 >>  centrilobular emphysema, RUL ASD CT sinus 07/30/13 >> opacification of Lt maxillary sinus, rightward NSD CT sinus 10/05/15 >> b/l mucosal edema paranasal sinuses, obstruction of ostium Rt maxillary sinus CT angio chest 07/09/17 >> GGO RUL HRCT 05/14/19 >> atherosclerosis, mild emphysema, 1.2 cm RUL opacity, patchy confluent GGO b/l with centrilobular micronodular character, moderate patchy air trapping  Cardiac tests:  Echo 12/28/18 >> EF 60 to 65%, mild LVH,  normal RV systolic fx LHC 01/04/18 >> severe 3 vessel CAD  Medications:   Allergies as of 05/30/2019      Reactions   Flomax [tamsulosin Hcl] Other (See Comments)   Made her 'trachea feel tight and couldn't swallow'   Peanut Oil Anaphylaxis, Swelling, Rash, Other (See Comments)   Throat tightens, also   Sulfa Antibiotics Anaphylaxis      Titanium Other (See Comments)   Neck wouldn't heal with titanium cervical plate   Yellow Dye Other (See Comments)   "Bee stings" all over body when eating/taking medications with red or yellow dye    Doxycycline Other (See Comments)   Per pt, caused oral thrush and loss of appetite   Lantus [insulin Glargine] Other (See Comments)   Patient states "sulfate binder causes sinus infection/pneumonia".   Levaquin [levofloxacin In D5w] Other (See Comments)   Muscle aches   Metoprolol-hydrochlorothiazide Other (See Comments)   Decreased B/P   Norvasc [amlodipine] Swelling, Other (See Comments)   BIL Ankle edema   Novolog [insulin Aspart]    Patient states "sulfate binder causes sinus infection/pneumonia". Tolerates Humalog.   Penciclovir Other (See Comments)   Muscle Pain   Red Dye Other (See Comments)   Burning sensation   Senna [sennosides] Other (See Comments)   Pt states that it causes a "stinging" sensation   Statins Other (See Comments)   All of them make me "feel badly"   Sulfonamide Derivatives Hives   Adhesive [tape] Hives, Rash   Maxidex [dexamethasone] Anxiety, Other (See Comments)   Insomnia and dizziness, too      Medication List       Accurate as of May 30, 2019 11:28 AM. If you have any questions, ask your nurse or doctor.        amLODipine 2.5 MG tablet Commonly known as: NORVASC Take 1 tablet (2.5 mg total) by mouth daily.   aspirin EC 81 MG tablet Take 1 tablet (81 mg total) by mouth daily.   cephALEXin 500 MG capsule Commonly known as: KEFLEX Take 500 mg by mouth 3 (three) times daily.   cetirizine 10 MG  tablet Commonly known as: ZYRTEC Take 10 mg by mouth daily as needed for allergies.   esomeprazole 40 MG capsule Commonly known as: NEXIUM Take 40 mg by mouth daily.   fluticasone 50 MCG/ACT nasal spray Commonly known as: FLONASE Place 1 spray into both nostrils daily.   furosemide 20 MG tablet Commonly known as: LASIX Take 1 tablet (20 mg total) by mouth daily.   HumaLOG KwikPen 100 UNIT/ML KwikPen Generic drug: insulin lispro Inject 5 Units into the skin 3 (three) times daily.   HYDROcodone-acetaminophen 7.5-325 MG tablet Commonly known as: NORCO Take 0.5-1 tablets by mouth every 6 (six) hours as needed for moderate pain.   imipramine 25 MG tablet Commonly known as: TOFRANIL Take 75 mg by mouth at bedtime.   Incruse Ellipta 62.5 MCG/INH Aepb Generic drug: umeclidinium bromide Inhale 1 puff into the lungs daily.   levothyroxine 100 MCG tablet Commonly known as: SYNTHROID Take 100 mcg by  mouth daily before breakfast.   montelukast 10 MG tablet Commonly known as: SINGULAIR Take 10 mg by mouth at bedtime.   Potassium Chloride ER 20 MEQ Tbcr Take 20 mEq by mouth daily.   predniSONE 5 MG tablet Commonly known as: DELTASONE Take 5 mg by mouth daily with breakfast.   predniSONE 20 MG tablet Commonly known as: Deltasone Take 2 tablets (40 mg total) by mouth daily with breakfast.   triazolam 0.25 MG tablet Commonly known as: HALCION Take 0.25 mg by mouth at bedtime.       Past Surgical History:  She  has a past surgical history that includes Total abdominal hysterectomy (1973); Bilateral oophorectomy (2002); Spinal fusion (1997); lobe thyroid removal; Thyroid lobectomy (1991); Cholecystectomy (1965); Appendectomy; Cataract extraction; Bronchoscopy (02-22-09); STENTED CARDIAC  ARTERY (2007 / 2007 / 2010); Proctoscopy (03/18/2012); Colon surgery (2014); Esophagogastroduodenoscopy (egd) with esophageal dilation (N/A, 06/18/2013); Cardiac catheterization; colonectomy;  Radiology with anesthesia (N/A, 03/09/2015); Flexible sigmoidoscopy (N/A, 07/15/2015); Balloon dilation (N/A, 07/15/2015); LEFT HEART CATH AND CORONARY ANGIOGRAPHY (N/A, 12/30/2018); and CORONARY STENT INTERVENTION (N/A, 12/30/2018).  Family History:  Her family history includes Cancer in her brother and sister; Diabetes in her brother, brother, father, and sister; Heart disease in her father; Other (age of onset: 85) in her mother; Stroke (age of onset: 60) in her father.  Social History:  She  reports that she quit smoking about 31 years ago. Her smoking use included cigarettes. She started smoking about 71 years ago. She has a 80.00 pack-year smoking history. She has never used smokeless tobacco. She reports that she does not drink alcohol or use drugs.

## 2019-05-30 NOTE — Patient Instructions (Signed)
Prednisone 40 mg daily for 2 weeks more, then 30 mg daily   Follow up in 6 weeks

## 2019-06-02 ENCOUNTER — Ambulatory Visit
Admission: RE | Admit: 2019-06-02 | Discharge: 2019-06-02 | Disposition: A | Discharge status: Home / Self Care | Payer: Medicare Other | Source: Ambulatory Visit | Attending: Neurology | Admitting: Neurology

## 2019-06-02 ENCOUNTER — Other Ambulatory Visit: Payer: Self-pay

## 2019-06-02 DIAGNOSIS — G912 (Idiopathic) normal pressure hydrocephalus: Secondary | ICD-10-CM

## 2019-06-04 ENCOUNTER — Telehealth: Payer: Self-pay | Admitting: Neurology

## 2019-06-04 NOTE — Telephone Encounter (Signed)
Ct scan results have not been resulted yet, I will route to Dr [name] Fairy to review and will call once I have results.

## 2019-06-04 NOTE — Telephone Encounter (Signed)
Patient daughter called to check on the status of the patients CT results. Please follow up.

## 2019-06-04 NOTE — Telephone Encounter (Signed)
No results dictated as if today 6 PM. CD

## 2019-06-05 NOTE — Telephone Encounter (Signed)
  Called and spoke with the patient's daughter informing her of the CT scan results and what it presented. The patient has been struggling more recently and they have called hospice in to provide comfort for the family. Patient's daughter state that they will keep the upcoming apt at this time but they will call and cancel if not needed as they get closer or they will convert and set up as a mychart video.      CT head without demonstrating:  -Moderate atrophy; moderate ventriculomegaly on ex vacuo basis.  -Moderate chronic small vessel ischemic disease.  -No acute findings.  -Stable compared to CT on 07/30/2018.   Unchanged CT, brain atrophy is moderate but not consistent with NPH- normal pressure hydrocephalus. This fits Azheimer's type dementia

## 2019-06-23 ENCOUNTER — Telehealth: Payer: Self-pay | Admitting: Neurology

## 2019-06-23 NOTE — Telephone Encounter (Signed)
FYI  Patient's daughter [name] had an appt today and stopped by check-out to ask if her mother can do a televisit as patient is now in hospice care. Pt's daughter states she will assist patient with this and is comfortable doing a telephone call.

## 2019-06-24 NOTE — Telephone Encounter (Signed)
Called the patient's daughter, there was no answer. LVM informing the patient's daughter that we can change the apt to be a telephone visit. I asked the patient's daughter to call back and confirm which number should be called for the apt. I also need to make sure the medication list is up to date.

## 2019-06-25 ENCOUNTER — Encounter: Payer: Self-pay | Admitting: Neurology

## 2019-06-25 ENCOUNTER — Other Ambulatory Visit: Payer: Self-pay

## 2019-06-25 ENCOUNTER — Ambulatory Visit (INDEPENDENT_AMBULATORY_CARE_PROVIDER_SITE_OTHER): Payer: Medicare Other | Admitting: Neurology

## 2019-06-25 DIAGNOSIS — F0390 Unspecified dementia without behavioral disturbance: Secondary | ICD-10-CM | POA: Insufficient documentation

## 2019-06-25 DIAGNOSIS — G319 Degenerative disease of nervous system, unspecified: Secondary | ICD-10-CM

## 2019-06-25 DIAGNOSIS — I200 Unstable angina: Secondary | ICD-10-CM

## 2019-06-25 NOTE — Progress Notes (Signed)
Virtual Visit via Telephone  Note  I connected with [name] on 06/25/19 at  2:30 PM EST by a video enabled telemedicine application and verified that I am speaking with the correct person using two identifiers.  Location: Patient:  At hospize.  Provider: Farrel Conners   I discussed the limitations of evaluation and management by telemedicine and the availability of in person appointments. The patient expressed understanding and agreed to proceed.  History of Present Illness: deferred.     Observations/Objective: patient spoke not directly with me but through  Daughter she is at home with home hospice. Reports chronic and waning confusional spells.     Assessment and Plan: I discussed with the patient and her daughter that the CT of the head dated 06-05-19 did not show any changes in comparison to a study from February of this year.  There is moderate to significant atrophy and ventriculomegaly by ex vacuo basis, there is chronic small vessel ischemic disease in a patient who is her smoking history and lung disease not surprising.  There were no acute findings no evidence of stroke, tumor or bleed, and no evidence of recent trauma.  Everything here fits an Alzheimer type dementia and I recommended to consider Aricept or Namenda.  The patient objected after being asked by her daughter if she would like to take this medication and stated she feels that she is taking enough.  Because of her pulmonary underlying condition the patient is self isolated at home but has home hospice visiting.  The patient is legally blind.   Follow Up Instructions: prn RV with [facility]-  Can be done with PCP.     I discussed the assessment and treatment plan with the patient. The patient was provided an opportunity to ask questions and all were answered. The patient agreed with the plan and demonstrated an understanding of the instructions.   The patient was advised to call back or seek an in-person evaluation if the  symptoms worsen or if the condition fails to improve as anticipated.  I provided 15 minutes of non-face-to-face time during this encounter.   Larey Seat, MD

## 2019-06-25 NOTE — Patient Instructions (Signed)
Dementia Dementia is a condition that affects the way the brain functions. It often affects memory and thinking. Usually, dementia gets worse with time and cannot be reversed (progressive dementia). There are many types of dementia, including:  Alzheimer's disease. This type is the most common.  Vascular dementia. This type may happen as the result of a stroke.  Lewy body dementia. This type may happen to people who have Parkinson's disease.  Frontotemporal dementia. This type is caused by damage to nerve cells (neurons) in certain parts of the brain. Some people may be affected by more than one type of dementia. This is called mixed dementia. What are the causes? Dementia is caused by damage to cells in the brain. The area of the brain and the types of cells damaged determine the type of dementia. Usually, this damage is irreversible or cannot be undone. Some examples of irreversible causes include:  Conditions that affect the blood vessels of the brain, such as diabetes, heart disease, or blood vessel disease.  Genetic mutations. In some cases, changes in the brain may be caused by another condition and can be reversed or slowed. Some examples of reversible causes include:  Injury to the brain.  Certain medicines.  Infection, such as meningitis.  Metabolic problems, such as vitamin B12 deficiency or thyroid disease.  Pressure on the brain, such as from a tumor or blood clot. What are the signs or symptoms? Symptoms of dementia depend on the type of dementia. Common signs of dementia include problems with remembering, thinking, problem solving, decision making, and communicating. These signs develop slowly or get worse with time. This may include:  Problems remembering things.  Having trouble taking a bath or putting clothes on.  Forgetting appointments.  Forgetting to pay bills.  Difficulty planning and preparing meals.  Having trouble speaking.  Getting lost easily. How  is this diagnosed? This condition is diagnosed by a specialist (neurologist). It is diagnosed based on the history of your symptoms, your medical history, a physical exam, and tests. Tests may include:  Tests to evaluate brain function, such as memory tests, cognitive tests, and other tests.  Lab tests, such as blood or urine tests.  Imaging tests, such as a CT scan, a PET scan, or an MRI.  Genetic testing. This may be done if other family members have a diagnosis of certain types of dementia. Your health care provider will talk with you and your family, friends, or caregivers about your history and symptoms. How is this treated?  Treatment for this condition depends on the cause of the dementia. Progressive dementias, such as Alzheimer's disease, cannot be cured, but there may be treatments that help to manage symptoms. Treatment might involve taking medicines that may help to:  Control the dementia.  Slow down the progression of the dementia.  Manage symptoms. In some cases, treating the cause of your dementia can improve symptoms, reverse symptoms, or slow down how quickly your dementia becomes worse. Your health care provider can direct you to support groups, organizations, and other health care providers who can help with decisions about your care. Follow these instructions at home: Medicines  Take over-the-counter and prescription medicines only as told by your health care provider.  Use a pill organizer or pill reminder to help you manage your medicines.  Avoid taking medicines that can affect thinking, such as pain medicines or sleeping medicines. Lifestyle  Make healthy lifestyle choices. ? Be physically active as told by your health care provider. ? Do   not use any products that contain nicotine or tobacco, such as cigarettes, e-cigarettes, and chewing tobacco. If you need help quitting, ask your health care provider. ? Do not drink alcohol. ? Practice stress-management  techniques when you get stressed. ? Spend time with other people.  Make sure to get quality sleep. These tips can help you get a good night's rest: ? Avoid napping during the day. ? Keep your sleeping area dark and cool. ? Avoid exercising during the few hours before you go to bed. ? Avoid caffeine products in the evening. Eating and drinking  Drink enough fluid to keep your urine pale yellow.  Eat a healthy diet. General instructions   Work with your health care provider to determine what you need help with and what your safety needs are.  Talk with your health care provider about whether it is safe for you to drive.  If you were given a bracelet that identifies you as a person with memory loss or tracks your location, make sure to wear it at all times.  Work with your family to make important decisions, such as advance directives, medical power of attorney, or a living will.  Keep all follow-up visits as told by your health care provider. This is important. Where to find more information  Alzheimer's Association: CapitalMile.co.nz  National Institute on Aging: DVDEnthusiasts.nl  World Health Organization: RoleLink.com.br Contact a health care provider if:  You have any new or worsening symptoms.  You have problems with choking or swallowing. Get help right away if:  You feel depressed or sad, or feel that you want to harm yourself.  Your family members become concerned for your safety. If you ever feel like you may hurt yourself or others, or have thoughts about taking your own life, get help right away. You can go to your nearest emergency department or call:  Your local emergency services (911 in the [place]  A suicide crisis helpline, such as the [place] at 2761130872. This is open 24 hours a day. Summary  Dementia is a condition that affects the way the brain functions. Dementia often affects memory and thinking.  Usually,  dementia gets worse with time and cannot be reversed (progressive dementia).  Treatment for this condition depends on the cause of the dementia.  Work with your health care provider to determine what you need help with and what your safety needs are.  Your health care provider can direct you to support groups, organizations, and other health care providers who can help with decisions about your care. This information is not intended to replace advice given to you by your health care provider. Make sure you discuss any questions you have with your health care provider. Document Released: 12/06/2000 Document Revised: 08/27/2018 Document Reviewed: 08/27/2018 Elsevier Patient Education  2020 [employer].

## 2019-06-27 DIAGNOSIS — K219 Gastro-esophageal reflux disease without esophagitis: Secondary | ICD-10-CM | POA: Diagnosis not present

## 2019-06-27 DIAGNOSIS — Z950 Presence of cardiac pacemaker: Secondary | ICD-10-CM | POA: Diagnosis not present

## 2019-06-27 DIAGNOSIS — Z8673 Personal history of transient ischemic attack (TIA), and cerebral infarction without residual deficits: Secondary | ICD-10-CM | POA: Diagnosis not present

## 2019-06-27 DIAGNOSIS — M1990 Unspecified osteoarthritis, unspecified site: Secondary | ICD-10-CM | POA: Diagnosis not present

## 2019-06-27 DIAGNOSIS — E119 Type 2 diabetes mellitus without complications: Secondary | ICD-10-CM | POA: Diagnosis not present

## 2019-06-27 DIAGNOSIS — Z794 Long term (current) use of insulin: Secondary | ICD-10-CM | POA: Diagnosis not present

## 2019-06-27 DIAGNOSIS — I2510 Atherosclerotic heart disease of native coronary artery without angina pectoris: Secondary | ICD-10-CM | POA: Diagnosis not present

## 2019-06-27 DIAGNOSIS — H3530 Unspecified macular degeneration: Secondary | ICD-10-CM | POA: Diagnosis not present

## 2019-06-27 DIAGNOSIS — K225 Diverticulum of esophagus, acquired: Secondary | ICD-10-CM | POA: Diagnosis not present

## 2019-06-27 DIAGNOSIS — J8489 Other specified interstitial pulmonary diseases: Secondary | ICD-10-CM | POA: Diagnosis not present

## 2019-06-27 DIAGNOSIS — Z87891 Personal history of nicotine dependence: Secondary | ICD-10-CM | POA: Diagnosis not present

## 2019-06-27 DIAGNOSIS — J449 Chronic obstructive pulmonary disease, unspecified: Secondary | ICD-10-CM | POA: Diagnosis not present

## 2019-06-27 DIAGNOSIS — Z9861 Coronary angioplasty status: Secondary | ICD-10-CM | POA: Diagnosis not present

## 2019-07-03 DIAGNOSIS — J8489 Other specified interstitial pulmonary diseases: Secondary | ICD-10-CM | POA: Diagnosis not present

## 2019-07-03 DIAGNOSIS — J449 Chronic obstructive pulmonary disease, unspecified: Secondary | ICD-10-CM | POA: Diagnosis not present

## 2019-07-03 DIAGNOSIS — Z950 Presence of cardiac pacemaker: Secondary | ICD-10-CM | POA: Diagnosis not present

## 2019-07-03 DIAGNOSIS — Z9861 Coronary angioplasty status: Secondary | ICD-10-CM | POA: Diagnosis not present

## 2019-07-03 DIAGNOSIS — I2510 Atherosclerotic heart disease of native coronary artery without angina pectoris: Secondary | ICD-10-CM | POA: Diagnosis not present

## 2019-07-03 DIAGNOSIS — E119 Type 2 diabetes mellitus without complications: Secondary | ICD-10-CM | POA: Diagnosis not present

## 2019-07-09 NOTE — Progress Notes (Signed)
Reviewd note from Pulmonary At this point would keep on Aspirin.

## 2019-07-10 DIAGNOSIS — J8489 Other specified interstitial pulmonary diseases: Secondary | ICD-10-CM | POA: Diagnosis not present

## 2019-07-10 DIAGNOSIS — Z9861 Coronary angioplasty status: Secondary | ICD-10-CM | POA: Diagnosis not present

## 2019-07-10 DIAGNOSIS — I2510 Atherosclerotic heart disease of native coronary artery without angina pectoris: Secondary | ICD-10-CM | POA: Diagnosis not present

## 2019-07-10 DIAGNOSIS — E119 Type 2 diabetes mellitus without complications: Secondary | ICD-10-CM | POA: Diagnosis not present

## 2019-07-10 DIAGNOSIS — J449 Chronic obstructive pulmonary disease, unspecified: Secondary | ICD-10-CM | POA: Diagnosis not present

## 2019-07-10 DIAGNOSIS — Z950 Presence of cardiac pacemaker: Secondary | ICD-10-CM | POA: Diagnosis not present

## 2019-07-15 DIAGNOSIS — J449 Chronic obstructive pulmonary disease, unspecified: Secondary | ICD-10-CM | POA: Diagnosis not present

## 2019-07-15 DIAGNOSIS — J8489 Other specified interstitial pulmonary diseases: Secondary | ICD-10-CM | POA: Diagnosis not present

## 2019-07-15 DIAGNOSIS — E119 Type 2 diabetes mellitus without complications: Secondary | ICD-10-CM | POA: Diagnosis not present

## 2019-07-15 DIAGNOSIS — Z950 Presence of cardiac pacemaker: Secondary | ICD-10-CM | POA: Diagnosis not present

## 2019-07-15 DIAGNOSIS — Z9861 Coronary angioplasty status: Secondary | ICD-10-CM | POA: Diagnosis not present

## 2019-07-15 DIAGNOSIS — I2510 Atherosclerotic heart disease of native coronary artery without angina pectoris: Secondary | ICD-10-CM | POA: Diagnosis not present

## 2019-07-17 DIAGNOSIS — Z9861 Coronary angioplasty status: Secondary | ICD-10-CM | POA: Diagnosis not present

## 2019-07-17 DIAGNOSIS — I2510 Atherosclerotic heart disease of native coronary artery without angina pectoris: Secondary | ICD-10-CM | POA: Diagnosis not present

## 2019-07-17 DIAGNOSIS — E119 Type 2 diabetes mellitus without complications: Secondary | ICD-10-CM | POA: Diagnosis not present

## 2019-07-17 DIAGNOSIS — J449 Chronic obstructive pulmonary disease, unspecified: Secondary | ICD-10-CM | POA: Diagnosis not present

## 2019-07-17 DIAGNOSIS — Z950 Presence of cardiac pacemaker: Secondary | ICD-10-CM | POA: Diagnosis not present

## 2019-07-17 DIAGNOSIS — J8489 Other specified interstitial pulmonary diseases: Secondary | ICD-10-CM | POA: Diagnosis not present

## 2019-07-22 DIAGNOSIS — J8489 Other specified interstitial pulmonary diseases: Secondary | ICD-10-CM | POA: Diagnosis not present

## 2019-07-22 DIAGNOSIS — J449 Chronic obstructive pulmonary disease, unspecified: Secondary | ICD-10-CM | POA: Diagnosis not present

## 2019-07-22 DIAGNOSIS — I2510 Atherosclerotic heart disease of native coronary artery without angina pectoris: Secondary | ICD-10-CM | POA: Diagnosis not present

## 2019-07-22 DIAGNOSIS — Z9861 Coronary angioplasty status: Secondary | ICD-10-CM | POA: Diagnosis not present

## 2019-07-22 DIAGNOSIS — Z950 Presence of cardiac pacemaker: Secondary | ICD-10-CM | POA: Diagnosis not present

## 2019-07-22 DIAGNOSIS — E119 Type 2 diabetes mellitus without complications: Secondary | ICD-10-CM | POA: Diagnosis not present

## 2019-07-23 DIAGNOSIS — I2510 Atherosclerotic heart disease of native coronary artery without angina pectoris: Secondary | ICD-10-CM | POA: Diagnosis not present

## 2019-07-23 DIAGNOSIS — Z950 Presence of cardiac pacemaker: Secondary | ICD-10-CM | POA: Diagnosis not present

## 2019-07-23 DIAGNOSIS — E119 Type 2 diabetes mellitus without complications: Secondary | ICD-10-CM | POA: Diagnosis not present

## 2019-07-23 DIAGNOSIS — J449 Chronic obstructive pulmonary disease, unspecified: Secondary | ICD-10-CM | POA: Diagnosis not present

## 2019-07-23 DIAGNOSIS — Z9861 Coronary angioplasty status: Secondary | ICD-10-CM | POA: Diagnosis not present

## 2019-07-23 DIAGNOSIS — J8489 Other specified interstitial pulmonary diseases: Secondary | ICD-10-CM | POA: Diagnosis not present

## 2019-07-24 DIAGNOSIS — Z950 Presence of cardiac pacemaker: Secondary | ICD-10-CM | POA: Diagnosis not present

## 2019-07-24 DIAGNOSIS — E119 Type 2 diabetes mellitus without complications: Secondary | ICD-10-CM | POA: Diagnosis not present

## 2019-07-24 DIAGNOSIS — Z9861 Coronary angioplasty status: Secondary | ICD-10-CM | POA: Diagnosis not present

## 2019-07-24 DIAGNOSIS — J8489 Other specified interstitial pulmonary diseases: Secondary | ICD-10-CM | POA: Diagnosis not present

## 2019-07-24 DIAGNOSIS — J449 Chronic obstructive pulmonary disease, unspecified: Secondary | ICD-10-CM | POA: Diagnosis not present

## 2019-07-24 DIAGNOSIS — I2510 Atherosclerotic heart disease of native coronary artery without angina pectoris: Secondary | ICD-10-CM | POA: Diagnosis not present

## 2019-07-27 DIAGNOSIS — J449 Chronic obstructive pulmonary disease, unspecified: Secondary | ICD-10-CM | POA: Diagnosis not present

## 2019-07-27 DIAGNOSIS — Z9861 Coronary angioplasty status: Secondary | ICD-10-CM | POA: Diagnosis not present

## 2019-07-27 DIAGNOSIS — E119 Type 2 diabetes mellitus without complications: Secondary | ICD-10-CM | POA: Diagnosis not present

## 2019-07-27 DIAGNOSIS — J8489 Other specified interstitial pulmonary diseases: Secondary | ICD-10-CM | POA: Diagnosis not present

## 2019-07-27 DIAGNOSIS — Z950 Presence of cardiac pacemaker: Secondary | ICD-10-CM | POA: Diagnosis not present

## 2019-07-27 DIAGNOSIS — I2510 Atherosclerotic heart disease of native coronary artery without angina pectoris: Secondary | ICD-10-CM | POA: Diagnosis not present

## 2019-07-28 DIAGNOSIS — Z87891 Personal history of nicotine dependence: Secondary | ICD-10-CM | POA: Diagnosis not present

## 2019-07-28 DIAGNOSIS — I2510 Atherosclerotic heart disease of native coronary artery without angina pectoris: Secondary | ICD-10-CM | POA: Diagnosis not present

## 2019-07-28 DIAGNOSIS — Z8673 Personal history of transient ischemic attack (TIA), and cerebral infarction without residual deficits: Secondary | ICD-10-CM | POA: Diagnosis not present

## 2019-07-28 DIAGNOSIS — Z9861 Coronary angioplasty status: Secondary | ICD-10-CM | POA: Diagnosis not present

## 2019-07-28 DIAGNOSIS — J8489 Other specified interstitial pulmonary diseases: Secondary | ICD-10-CM | POA: Diagnosis not present

## 2019-07-28 DIAGNOSIS — E119 Type 2 diabetes mellitus without complications: Secondary | ICD-10-CM | POA: Diagnosis not present

## 2019-07-28 DIAGNOSIS — K219 Gastro-esophageal reflux disease without esophagitis: Secondary | ICD-10-CM | POA: Diagnosis not present

## 2019-07-28 DIAGNOSIS — H3530 Unspecified macular degeneration: Secondary | ICD-10-CM | POA: Diagnosis not present

## 2019-07-28 DIAGNOSIS — M1990 Unspecified osteoarthritis, unspecified site: Secondary | ICD-10-CM | POA: Diagnosis not present

## 2019-07-28 DIAGNOSIS — Z794 Long term (current) use of insulin: Secondary | ICD-10-CM | POA: Diagnosis not present

## 2019-07-28 DIAGNOSIS — Z950 Presence of cardiac pacemaker: Secondary | ICD-10-CM | POA: Diagnosis not present

## 2019-07-28 DIAGNOSIS — K225 Diverticulum of esophagus, acquired: Secondary | ICD-10-CM | POA: Diagnosis not present

## 2019-07-28 DIAGNOSIS — J449 Chronic obstructive pulmonary disease, unspecified: Secondary | ICD-10-CM | POA: Diagnosis not present

## 2019-07-29 DIAGNOSIS — I2510 Atherosclerotic heart disease of native coronary artery without angina pectoris: Secondary | ICD-10-CM | POA: Diagnosis not present

## 2019-07-29 DIAGNOSIS — J449 Chronic obstructive pulmonary disease, unspecified: Secondary | ICD-10-CM | POA: Diagnosis not present

## 2019-07-29 DIAGNOSIS — E119 Type 2 diabetes mellitus without complications: Secondary | ICD-10-CM | POA: Diagnosis not present

## 2019-07-29 DIAGNOSIS — Z9861 Coronary angioplasty status: Secondary | ICD-10-CM | POA: Diagnosis not present

## 2019-07-29 DIAGNOSIS — J8489 Other specified interstitial pulmonary diseases: Secondary | ICD-10-CM | POA: Diagnosis not present

## 2019-07-29 DIAGNOSIS — Z950 Presence of cardiac pacemaker: Secondary | ICD-10-CM | POA: Diagnosis not present

## 2019-07-31 ENCOUNTER — Telehealth: Payer: Self-pay | Admitting: *Deleted

## 2019-07-31 NOTE — Telephone Encounter (Signed)
Message from Dr. Harrington Challenger after she reviewed patient visit with Dr. Halford Chessman: Reviewd note from Pulmonary At this point would keep on Aspirin.    I called patient to make sure she was aware that was still the recommendation and that she is taking aspirin and no Brilinta.   I left her a message to call back to confirm.

## 2019-08-01 DIAGNOSIS — Z9861 Coronary angioplasty status: Secondary | ICD-10-CM | POA: Diagnosis not present

## 2019-08-01 DIAGNOSIS — E119 Type 2 diabetes mellitus without complications: Secondary | ICD-10-CM | POA: Diagnosis not present

## 2019-08-01 DIAGNOSIS — J8489 Other specified interstitial pulmonary diseases: Secondary | ICD-10-CM | POA: Diagnosis not present

## 2019-08-01 DIAGNOSIS — Z950 Presence of cardiac pacemaker: Secondary | ICD-10-CM | POA: Diagnosis not present

## 2019-08-01 DIAGNOSIS — J449 Chronic obstructive pulmonary disease, unspecified: Secondary | ICD-10-CM | POA: Diagnosis not present

## 2019-08-01 DIAGNOSIS — I2510 Atherosclerotic heart disease of native coronary artery without angina pectoris: Secondary | ICD-10-CM | POA: Diagnosis not present

## 2019-08-04 DIAGNOSIS — E119 Type 2 diabetes mellitus without complications: Secondary | ICD-10-CM | POA: Diagnosis not present

## 2019-08-04 DIAGNOSIS — Z950 Presence of cardiac pacemaker: Secondary | ICD-10-CM | POA: Diagnosis not present

## 2019-08-04 DIAGNOSIS — J449 Chronic obstructive pulmonary disease, unspecified: Secondary | ICD-10-CM | POA: Diagnosis not present

## 2019-08-04 DIAGNOSIS — I2510 Atherosclerotic heart disease of native coronary artery without angina pectoris: Secondary | ICD-10-CM | POA: Diagnosis not present

## 2019-08-04 DIAGNOSIS — J8489 Other specified interstitial pulmonary diseases: Secondary | ICD-10-CM | POA: Diagnosis not present

## 2019-08-04 DIAGNOSIS — Z9861 Coronary angioplasty status: Secondary | ICD-10-CM | POA: Diagnosis not present

## 2019-08-07 ENCOUNTER — Telehealth: Payer: Self-pay | Admitting: Pulmonary Disease

## 2019-08-07 DIAGNOSIS — E119 Type 2 diabetes mellitus without complications: Secondary | ICD-10-CM | POA: Diagnosis not present

## 2019-08-07 DIAGNOSIS — Z950 Presence of cardiac pacemaker: Secondary | ICD-10-CM | POA: Diagnosis not present

## 2019-08-07 DIAGNOSIS — J8489 Other specified interstitial pulmonary diseases: Secondary | ICD-10-CM | POA: Diagnosis not present

## 2019-08-07 DIAGNOSIS — I2510 Atherosclerotic heart disease of native coronary artery without angina pectoris: Secondary | ICD-10-CM | POA: Diagnosis not present

## 2019-08-07 DIAGNOSIS — Z9861 Coronary angioplasty status: Secondary | ICD-10-CM | POA: Diagnosis not present

## 2019-08-07 DIAGNOSIS — J449 Chronic obstructive pulmonary disease, unspecified: Secondary | ICD-10-CM | POA: Diagnosis not present

## 2019-08-07 NOTE — Telephone Encounter (Signed)
Called pt and spoke with her daughter letting her know the info stated by VS about the instructions for pt to increase prednisone. Terri verbalized understanding.nothing further needed.

## 2019-08-07 NOTE — Telephone Encounter (Signed)
Called and spoke with pt who stated she has been having a time with her breathing since last OV. Pt states she is currently on 5mg  prednisone but she states she feels like she needs to have it increased due to her breathing. Pt also states she is wheezing some and is also coughing as well as sneezing.  Pt states she will occ have a low grade fever off and on due to the scleroderma. Pt has not taken her temp today but states her temp yesterday was 99.4 which is normal for her due to the scleroderma.  Dr. Halford Chessman, please advise on this for pt if we can increase her prednisone to see if it will help with her breathing. Pt does have an upcoming appt scheduled with Sarah.

## 2019-08-07 NOTE — Telephone Encounter (Signed)
Patient informed to stay on aspirin and no Brilinta at this time. She reports that she doesn't have any energy.  She assigned herself hospice through [facility] after her last pulm visit. She did not scheduled a follow up with pulmonary after last visit. I encouraged her to call and arrange follow up.

## 2019-08-07 NOTE — Telephone Encounter (Signed)
Patient returned a call to [facility]

## 2019-08-07 NOTE — Telephone Encounter (Signed)
Patient informed to stay on aspirin and no Brilinta at this time. She reports that she doesn't have any energy.  She assigned herself hospice through [facility] after her last pulm visit. She did not scheduled a follow up with pulmonary after last visit. I encouraged her to call and arrange follow up.

## 2019-08-07 NOTE — Telephone Encounter (Signed)
Have her increase prednisone to 20 mg daily for 7 days, 10 mg daily for 7 days, then back to 5 mg daily.

## 2019-08-11 ENCOUNTER — Ambulatory Visit: Payer: Medicare Other | Admitting: Acute Care

## 2019-08-13 DIAGNOSIS — Z950 Presence of cardiac pacemaker: Secondary | ICD-10-CM | POA: Diagnosis not present

## 2019-08-13 DIAGNOSIS — E119 Type 2 diabetes mellitus without complications: Secondary | ICD-10-CM | POA: Diagnosis not present

## 2019-08-13 DIAGNOSIS — I2510 Atherosclerotic heart disease of native coronary artery without angina pectoris: Secondary | ICD-10-CM | POA: Diagnosis not present

## 2019-08-13 DIAGNOSIS — J8489 Other specified interstitial pulmonary diseases: Secondary | ICD-10-CM | POA: Diagnosis not present

## 2019-08-13 DIAGNOSIS — J449 Chronic obstructive pulmonary disease, unspecified: Secondary | ICD-10-CM | POA: Diagnosis not present

## 2019-08-13 DIAGNOSIS — Z9861 Coronary angioplasty status: Secondary | ICD-10-CM | POA: Diagnosis not present

## 2019-08-20 ENCOUNTER — Ambulatory Visit: Payer: Medicare Other | Admitting: Internal Medicine

## 2019-08-20 DIAGNOSIS — E119 Type 2 diabetes mellitus without complications: Secondary | ICD-10-CM | POA: Diagnosis not present

## 2019-08-20 DIAGNOSIS — I2510 Atherosclerotic heart disease of native coronary artery without angina pectoris: Secondary | ICD-10-CM | POA: Diagnosis not present

## 2019-08-20 DIAGNOSIS — J449 Chronic obstructive pulmonary disease, unspecified: Secondary | ICD-10-CM | POA: Diagnosis not present

## 2019-08-20 DIAGNOSIS — Z950 Presence of cardiac pacemaker: Secondary | ICD-10-CM | POA: Diagnosis not present

## 2019-08-20 DIAGNOSIS — Z9861 Coronary angioplasty status: Secondary | ICD-10-CM | POA: Diagnosis not present

## 2019-08-20 DIAGNOSIS — J8489 Other specified interstitial pulmonary diseases: Secondary | ICD-10-CM | POA: Diagnosis not present

## 2019-08-20 NOTE — Progress Notes (Deleted)
Name: [name]  Age/ Sex: 86 y.o., female   MRN/ DOB: [id number], April 09, 1931     PCP: Merrilee Seashore, MD   Reason for Endocrinology Evaluation: Type 2 Diabetes Mellitus  Initial Endocrine Consultative Visit:  05/21/2019    PATIENT IDENTIFIER: [name] is a 86 y.o. female with a past medical history of BOOP, emphysema, Scleroderma , macular degeneration and DM. The patient has followed with Endocrinology clinic since 05/21/2019 for consultative assistance with management of her diabetes.  DIABETIC HISTORY:  [name] was diagnosed with T2DM many years ago.  On her initial visit to our clinic her A1c was 10.0% , she was on Humalog only.  Patient declined long-acting insulin. Her hemoglobin A1c has ranged from 7.2% in 2013, peaking at 9.0%in 2020.  She is on chronic Prednisone for lung issues and emphysema. SUBJECTIVE:   During the last visit (05/21/2019): A1c 10.0%, she declined adding basal insulin, we did increase her Humalog dose.  Today (08/20/2019): [name] is here for follow-up on diabetes management. She checks her blood sugars *** times daily, preprandial to breakfast and ***. The patient has *** had hypoglycemic episodes since the last clinic visit, which typically occur *** x / - most often occuring ***. The patient is *** symptomatic with these episodes, with symptoms of {symptoms; hypoglycemia:[id number]}. Otherwise, the patient has not required any recent emergency interventions for hypoglycemia and has not had recent hospitalizations secondary to hyper or hypoglycemic episodes.    ROS: As per HPI and as detailed below:    HOME DIABETES REGIMEN:  Humalog 8 units TIDQAC    METER DOWNLOAD SUMMARY: Date range evaluated: *** Fingerstick Blood Glucose Tests = *** Average Number Tests/Day = *** Overall Mean FS Glucose = *** Standard Deviation = ***  BG  Ranges: Low = *** High = ***   Hypoglycemic Events/30 Days: BG < 50 = *** Episodes of symptomatic severe hypoglycemia = ***   DIABETIC COMPLICATIONS: Microvascular complications:    Denies: CKD, retinopathy , neuropathy  Last eye exam: Completed   Macrovascular complications:   CAD  Denies: PVD, CVA    HISTORY:  Past Medical History:  Past Medical History:  Diagnosis Date  . Adenomatous colon polyp   . Arthritis   . Blood transfusion   . Cancer [facility])    cervical cancer pre hysterectomy  . Cervical disc disease   . Chronic sinusitis   . CONSTIPATION, CHRONIC 06/26/2007   Qualifier: Diagnosis of  By: Nils Pyle CMA ([place]), Mearl Latin    . COPD (chronic obstructive pulmonary disease) ([place])   . Coronary artery disease    prior MI with stenting of RCA with repeat PTCA/stenting for ISR in 01/2008, stent to LM 11/2008  . CVA (cerebral infarction) 1991  . Depression   . Diabetes mellitus   . Diverticulosis   . 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 Dyslipidemia   . Emphysema   . Fibromyalgia   . GERD (gastroesophageal reflux disease)   . Hypertension   . Hypothyroidism   . IBS (irritable bowel syndrome)   . Legally blind   . Macular degeneration   . Myocardial infarction [facility]) may 27th 2007  . Pneumonia 07/16/2013    HX PNEUMONIA  . Pulmonary hypertension ([place])   . Raynaud's phenomenon   . Scleroderma ([place])   . Sjogren's disease ([place])   . Sleep apnea    STOP BANG SCORE 5  . Stroke [facility]) 1990   brain stem stroke - weakness rt hand  .  Zenker's diverticulum     Past Surgical History:  Past Surgical History:  Procedure Laterality Date  . APPENDECTOMY    . BALLOON DILATION N/A 07/15/2015   Procedure: BALLOON DILATION;  Surgeon: Milus Banister, MD;  [facility];  Service: Endoscopy;  Laterality: N/A;  . BILATERAL OOPHORECTOMY  2002  . BRONCHOSCOPY  02-22-09  . CARDIAC CATHETERIZATION     x 3  . CATARACT EXTRACTION    . CHOLECYSTECTOMY  1965  . COLON  SURGERY  2014   colon resection  . colonectomy    . CORONARY STENT INTERVENTION N/A 12/30/2018   Procedure: CORONARY STENT INTERVENTION;  Surgeon: Sherren Mocha, MD;  Location: [place] [facility];  Service: Cardiovascular;  Laterality: N/A;  . ESOPHAGOGASTRODUODENOSCOPY (EGD) WITH ESOPHAGEAL DILATION N/A 06/18/2013   Procedure: ESOPHAGOGASTRODUODENOSCOPY (EGD) WITH ESOPHAGEAL DILATION;  Surgeon: Inda Castle, MD;  Location: [place];  Service: Endoscopy;  Laterality: N/A;  . FLEXIBLE SIGMOIDOSCOPY N/A 07/15/2015   Procedure: FLEXIBLE SIGMOIDOSCOPY;  Surgeon: Milus Banister, MD;  [facility];  Service: Endoscopy;  Laterality: N/A;  . LEFT HEART CATH AND CORONARY ANGIOGRAPHY N/A 12/30/2018   Procedure: LEFT HEART CATH AND CORONARY ANGIOGRAPHY;  Surgeon: Jolaine Artist, MD;  Location: [place] [facility];  Service: Cardiovascular;  Laterality: N/A;  . lobe thyroid removal    . PROCTOSCOPY  03/18/2012   Procedure: PROCTOSCOPY;  Surgeon: Adin Hector, MD;  [facility];  Service: General;  Laterality: N/A;  rigid procto  . RADIOLOGY WITH ANESTHESIA N/A 03/09/2015   Procedure: MRI CERVICAL SPINE WITHOUT AND LUMBER WITHOUT;  Surgeon: Medication Radiologist, MD;  Location: [place];  Service: Radiology;  Laterality: N/A;  . [place]   and implantation of a plate   . STENTED CARDIAC  ARTERY  2007 / 2007 / 2010   X 3 STENT  . THYROID LOBECTOMY  1991   removal of left lobe thyroid  . TOTAL ABDOMINAL HYSTERECTOMY  1973     Social History:  reports that she quit smoking about 32 years ago. Her smoking use included cigarettes. She started smoking about 72 years ago. She has a 80.00 pack-year smoking history. She has never used smokeless tobacco. She reports that she does not drink alcohol or use drugs. Family History:  Family History  Problem Relation Age of Onset  . Other Mother 27       Died from sepsis  . Stroke Father 80       died  . Heart disease Father   .  Diabetes Father   . Cancer Sister        lung  . Diabetes Sister   . Cancer Brother        lung  . Diabetes Brother   . Diabetes Brother       HOME MEDICATIONS: Allergies as of 08/20/2019      Reactions   Flomax [tamsulosin Hcl] Other (See Comments)   Made her 'trachea feel tight and couldn't swallow'   Peanut Oil Anaphylaxis, Swelling, Rash, Other (See Comments)   Throat tightens, also   Sulfa Antibiotics Anaphylaxis      Titanium Other (See Comments)   Neck wouldn't heal with titanium cervical plate   Yellow Dye Other (See Comments)   "Bee stings" all over body when eating/taking medications with red or yellow dye    Doxycycline Other (See Comments)   Per pt, caused oral thrush and loss of appetite   Lantus [insulin Glargine]  Other (See Comments)   Patient states "sulfate binder causes sinus infection/pneumonia".   Levaquin [levofloxacin In D5w] Other (See Comments)   Muscle aches   Metoprolol-hydrochlorothiazide Other (See Comments)   Decreased B/P   Norvasc [amlodipine] Swelling, Other (See Comments)   BIL Ankle edema   Novolog [insulin Aspart]    Patient states "sulfate binder causes sinus infection/pneumonia". Tolerates Humalog.   Penciclovir Other (See Comments)   Muscle Pain   Red Dye Other (See Comments)   Burning sensation   Senna [sennosides] Other (See Comments)   Pt states that it causes a "stinging" sensation   Statins Other (See Comments)   All of them make me "feel badly"   Sulfonamide Derivatives Hives   Adhesive [tape] Hives, Rash   Maxidex [dexamethasone] Anxiety, Other (See Comments)   Insomnia and dizziness, too      Medication List       Accurate as of August 20, 2019 12:39 PM. If you have any questions, ask your nurse or doctor.        amLODipine 2.5 MG tablet Commonly known as: NORVASC Take 1 tablet (2.5 mg total) by mouth daily.   aspirin EC 81 MG tablet Take 1 tablet (81 mg total) by mouth daily.   cephALEXin 500 MG  capsule Commonly known as: KEFLEX Take 500 mg by mouth 3 (three) times daily.   cetirizine 10 MG tablet Commonly known as: ZYRTEC Take 10 mg by mouth daily as needed for allergies.   esomeprazole 40 MG capsule Commonly known as: NEXIUM Take 40 mg by mouth daily.   fluticasone 50 MCG/ACT nasal spray Commonly known as: FLONASE Place 1 spray into both nostrils daily.   furosemide 20 MG tablet Commonly known as: LASIX Take 1 tablet (20 mg total) by mouth daily.   HumaLOG KwikPen 100 UNIT/ML KwikPen Generic drug: insulin lispro Inject 5 Units into the skin 3 (three) times daily.   HYDROcodone-acetaminophen 7.5-325 MG tablet Commonly known as: NORCO Take 0.5-1 tablets by mouth every 6 (six) hours as needed for moderate pain.   imipramine 25 MG tablet Commonly known as: TOFRANIL Take 75 mg by mouth at bedtime.   Incruse Ellipta 62.5 MCG/INH Aepb Generic drug: umeclidinium bromide Inhale 1 puff into the lungs daily.   levothyroxine 100 MCG tablet Commonly known as: SYNTHROID Take 100 mcg by mouth daily before breakfast.   montelukast 10 MG tablet Commonly known as: SINGULAIR Take 10 mg by mouth at bedtime.   Potassium Chloride ER 20 MEQ Tbcr Take 20 mEq by mouth daily.   predniSONE 5 MG tablet Commonly known as: DELTASONE Take 5 mg by mouth daily with breakfast.   predniSONE 20 MG tablet Commonly known as: Deltasone Take 2 tablets (40 mg total) by mouth daily with breakfast.   triazolam 0.25 MG tablet Commonly known as: HALCION Take 0.25 mg by mouth at bedtime.        OBJECTIVE:   Vital Signs: There were no vitals taken for this visit.  Wt Readings from Last 3 Encounters:  05/30/19 129 lb 12.8 oz (58.9 kg)  05/21/19 127 lb 12.8 oz (58 kg)  05/16/19 128 lb 6.4 oz (58.2 kg)     Exam: General: Pt appears well and is in NAD  Hydration: Well-hydrated with moist mucous membranes and good skin turgor  HEENT: Head: Unremarkable with good dentition.  Oropharynx clear without exudate.  Eyes: External eye exam normal without stare, lid lag or exophthalmos.  EOM intact.  PERRL.  Neck: General: Supple  without adenopathy. Thyroid: Thyroid size normal.  No goiter or nodules appreciated. No thyroid bruit.  Lungs: Clear with good BS bilat with no rales, rhonchi, or wheezes  Heart: RRR with normal S1 and S2 and no gallops; no murmurs; no rub  Abdomen: Normoactive bowel sounds, soft, nontender, without masses or organomegaly palpable  Extremities: No pretibial edema. No tremor. Normal strength and motion throughout. See detailed diabetic foot exam below.  Skin: Normal texture and temperature to palpation. No rash noted. No Acanthosis nigricans/skin tags. No lipohypertrophy.  Neuro: MS is good with appropriate affect, pt is alert and Ox3    DM foot exam: Please see diabetic assessment flow-sheet detailed below:           DATA REVIEWED:  Lab Results  Component Value Date   HGBA1C 10.0 (A) 05/21/2019   HGBA1C 9.0 (H) 12/27/2018   HGBA1C 8.0 (H) 07/30/2018   Lab Results  Component Value Date   LDLCALC 119 (H) 12/30/2018   CREATININE 0.83 05/05/2019   No results found for: [facility]   Lab Results  Component Value Date   CHOL 198 12/30/2018   HDL 51 12/30/2018   LDLCALC 119 (H) 12/30/2018   LDLDIRECT 127.6 05/15/2013   TRIG 139 12/30/2018   CHOLHDL 3.9 12/30/2018         ASSESSMENT / PLAN / RECOMMENDATIONS:   1) Type 2 Diabetes Mellitus, Poorly Controlled, Without complications - Most recent A1c of 10.0 %. Goal A1c < 8.0 %.     Plan: MEDICATIONS:  ***  EDUCATION / INSTRUCTIONS:  BG monitoring instructions: Patient is instructed to check her blood sugars *** times a day, ***.  Call [place] [facility] clinic if: BG persistently < 70 or > 300. . I reviewed the Rule of 15 for the treatment of hypoglycemia in detail with the patient. Literature supplied.   F/U in ***    Signed electronically by: Mack Guise, MD  [facility] Endocrinology  [facility] [address] [place], [place] [address] Phone: [phone] FAX: [phone]   CC: Merrilee Seashore, [address] [place] [address] Phone: [phone]  Fax: [phone]  Return to Endocrinology clinic as below: Future Appointments  Date Time Provider [place]  08/20/2019  2:00 PM Jatziry Wechter, Melanie Crazier, MD [id number] None

## 2019-08-21 DIAGNOSIS — J8489 Other specified interstitial pulmonary diseases: Secondary | ICD-10-CM | POA: Diagnosis not present

## 2019-08-21 DIAGNOSIS — Z9861 Coronary angioplasty status: Secondary | ICD-10-CM | POA: Diagnosis not present

## 2019-08-21 DIAGNOSIS — J449 Chronic obstructive pulmonary disease, unspecified: Secondary | ICD-10-CM | POA: Diagnosis not present

## 2019-08-21 DIAGNOSIS — E119 Type 2 diabetes mellitus without complications: Secondary | ICD-10-CM | POA: Diagnosis not present

## 2019-08-21 DIAGNOSIS — I2510 Atherosclerotic heart disease of native coronary artery without angina pectoris: Secondary | ICD-10-CM | POA: Diagnosis not present

## 2019-08-21 DIAGNOSIS — Z950 Presence of cardiac pacemaker: Secondary | ICD-10-CM | POA: Diagnosis not present

## 2019-08-22 NOTE — Progress Notes (Signed)
Cardiology Office Note   Date:  08/26/2019   ID:  [name], [name] 11/07/1930, MRN [id number]  PCP:  Merrilee Seashore, MD  Cardiologist:   Dorris Carnes, MD   F/U of CAD    History of Present Illness: [name] is a 86 y.o. female with a history of CAD (s/p IWMI with stent to RCA;  Repeat stent to RCA for instent restenosis; s/p DES to [facility]; cath 2011:  40% LAD; 50% Lcx ; 80% small OM1: RCA 20%)   She was admitted in July with CP   Trop minimally elevated   Cath showed:  LM stent patent; LCx 70% ostial stensosis; OM1 90% ostial stenosis; MLCx with subtotal occlusion; LAD 90% mid; 95/5 mid/distal RCA.   The pt underwent PTCA/DES x 2 to mid LAD and mid RCA  Also a history of BOOP and hyperlipdemia  She is legally blind     SInce seen she has had difficulty with SOB   THought possibly due to Brilinta.  Unfortunately she is allergic to Plavix (Hx diffuse rash), not a candidate for Effient (hx CVA)   Ticlid is not avaailable anywhere)  SHe denies CP  I saw the pt in fall 2020 After I saw her she continued to have SOB  I reviewed with interventional service  Felt to be OK to stop Brilinta     Since then she has been seen by V Sood in Pulmonary has been treated with high dose prednisone for pneumonitis from probable scleroderma  Today, the pt denies CP   Her breathing is OK  She just feels drained though, no energy       Current Meds  Medication Sig  . amLODipine (NORVASC) 2.5 MG tablet Take 1 tablet (2.5 mg total) by mouth daily.  [name] aspirin EC 81 MG tablet Take 1 tablet (81 mg total) by mouth daily.  . cetirizine (ZYRTEC) 10 MG tablet Take 10 mg by mouth daily as needed for allergies.  [name] esomeprazole (NEXIUM) 40 MG capsule Take 40 mg by mouth daily.   . fluticasone (FLONASE) 50 MCG/ACT nasal spray Place 1 spray into both nostrils daily.  . furosemide (LASIX) 20 MG tablet Take 1 tablet (20 mg total) by mouth daily.  [name] HYDROcodone-acetaminophen (NORCO) 7.5-325 MG tablet Take  0.5-1 tablets by mouth every 6 (six) hours as needed for moderate pain. 10mg -325mg   . imipramine (TOFRANIL) 25 MG tablet Take 75 mg by mouth at bedtime.   . insulin lispro (HUMALOG KWIKPEN) 100 UNIT/ML KwikPen Inject 5 Units into the skin 3 (three) times daily.   [name] levothyroxine (SYNTHROID, LEVOTHROID) 100 MCG tablet Take 100 mcg by mouth daily before breakfast.  . LORazepam (ATIVAN) 0.5 MG tablet Take 0.5 mg by mouth 2 (two) times daily as needed.  . montelukast (SINGULAIR) 10 MG tablet Take 10 mg by mouth at bedtime.   . potassium chloride 20 MEQ TBCR Take 20 mEq by mouth daily.  . predniSONE (DELTASONE) 5 MG tablet Take 10 mg by mouth daily with breakfast.   . triazolam (HALCION) 0.25 MG tablet Take 0.25 mg by mouth at bedtime.   [name] umeclidinium bromide (INCRUSE ELLIPTA) 62.5 MCG/INH AEPB Inhale 1 puff into the lungs daily.     Allergies:   Flomax [tamsulosin hcl], Peanut oil, Sulfa antibiotics, Titanium, Yellow dye, Doxycycline, Lantus [insulin glargine], Levaquin [levofloxacin in d5w], Metoprolol-hydrochlorothiazide, Norvasc [amlodipine], Novolog [insulin aspart], Penciclovir, Red dye, Senna [sennosides], Statins, Sulfonamide derivatives, Adhesive [tape], and Maxidex [dexamethasone]   Past  Medical History:  Diagnosis Date  . Adenomatous colon polyp   . Arthritis   . Blood transfusion   . Cancer [facility])    cervical cancer pre hysterectomy  . Cervical disc disease   . Chronic sinusitis   . CONSTIPATION, CHRONIC 06/26/2007   Qualifier: Diagnosis of  By: Nils Pyle CMA ([place]), Mearl Latin    . COPD (chronic obstructive pulmonary disease) ([place])   . Coronary artery disease    prior MI with stenting of RCA with repeat PTCA/stenting for ISR in 01/2008, stent to LM 11/2008  . CVA (cerebral infarction) 1991  . Depression   . Diabetes mellitus   . Diverticulosis   . 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 Dyslipidemia   . Emphysema   . Fibromyalgia   . GERD (gastroesophageal reflux disease)   . Hypertension    . Hypothyroidism   . IBS (irritable bowel syndrome)   . Legally blind   . Macular degeneration   . Myocardial infarction [facility]) may 27th 2007  . Pneumonia 07/16/2013    HX PNEUMONIA  . Pulmonary hypertension ([place])   . Raynaud's phenomenon   . Scleroderma ([place])   . Sjogren's disease ([place])   . Sleep apnea    STOP BANG SCORE 5  . Stroke [facility]) 1990   brain stem stroke - weakness rt hand  . Zenker's diverticulum     Past Surgical History:  Procedure Laterality Date  . APPENDECTOMY    . BALLOON DILATION N/A 07/15/2015   Procedure: BALLOON DILATION;  Surgeon: Milus Banister, MD;  [facility];  Service: Endoscopy;  Laterality: N/A;  . BILATERAL OOPHORECTOMY  2002  . BRONCHOSCOPY  02-22-09  . CARDIAC CATHETERIZATION     x 3  . CATARACT EXTRACTION    . CHOLECYSTECTOMY  1965  . COLON SURGERY  2014   colon resection  . colonectomy    . CORONARY STENT INTERVENTION N/A 12/30/2018   Procedure: CORONARY STENT INTERVENTION;  Surgeon: Sherren Mocha, MD;  Location: [place] [facility];  Service: Cardiovascular;  Laterality: N/A;  . ESOPHAGOGASTRODUODENOSCOPY (EGD) WITH ESOPHAGEAL DILATION N/A 06/18/2013   Procedure: ESOPHAGOGASTRODUODENOSCOPY (EGD) WITH ESOPHAGEAL DILATION;  Surgeon: Inda Castle, MD;  Location: [place];  Service: Endoscopy;  Laterality: N/A;  . FLEXIBLE SIGMOIDOSCOPY N/A 07/15/2015   Procedure: FLEXIBLE SIGMOIDOSCOPY;  Surgeon: Milus Banister, MD;  [facility];  Service: Endoscopy;  Laterality: N/A;  . LEFT HEART CATH AND CORONARY ANGIOGRAPHY N/A 12/30/2018   Procedure: LEFT HEART CATH AND CORONARY ANGIOGRAPHY;  Surgeon: Jolaine Artist, MD;  Location: [place] CV [facility];  Service: Cardiovascular;  Laterality: N/A;  . lobe thyroid removal    . PROCTOSCOPY  03/18/2012   Procedure: PROCTOSCOPY;  Surgeon: Adin Hector, MD;  [facility];  Service: General;  Laterality: N/A;  rigid procto  . RADIOLOGY WITH ANESTHESIA N/A 03/09/2015    Procedure: MRI CERVICAL SPINE WITHOUT AND LUMBER WITHOUT;  Surgeon: Medication Radiologist, MD;  Location: [place];  Service: Radiology;  Laterality: N/A;  . [place]   and implantation of a plate   . STENTED CARDIAC  ARTERY  2007 / 2007 / 2010   X 3 STENT  . THYROID LOBECTOMY  1991   removal of left lobe thyroid  . TOTAL ABDOMINAL HYSTERECTOMY  1973     Social History:  The patient  reports that she quit smoking about 32 years ago. Her smoking use included cigarettes. She started smoking about 72 years ago. She  has a 80.00 pack-year smoking history. She has never used smokeless tobacco. She reports that she does not drink alcohol or use drugs.   Family History:  The patient's family history includes Cancer in her brother and sister; Diabetes in her brother, brother, father, and sister; Heart disease in her father; Other (age of onset: 71) in her mother; Stroke (age of onset: 13) in her father.    ROS:  Please see the history of present illness. All other systems are reviewed and  Negative to the above problem except as noted.    PHYSICAL EXAM: VS:  BP (!) 136/48   Pulse 80   Ht 5\' 2"  (1.575 m)   Wt 135 lb 6.4 oz (61.4 kg)   SpO2 97%   BMI 24.76 kg/m   GEN: Well nourished, well developed, in no acute distress  HEENT: normal  Neck:  JVD is normal    Cardiac: RRR; no murmurs, rubs, or gallops,No signif edema  Respiratory:  clear to auscultation bilaterally GI: soft, nontender, nondistended, + BS  No hepatomegaly  MS: no deformity Moving all extremities   Skin: warm and dry, no rash Neuro:  Strength and sensation are intact Psych: euthymic mood, full affect   EKG:  EKG is not ordered today.    Lipid Panel    Component Value Date/Time   CHOL 231 (H) 08/25/2019 1522   TRIG 147 08/25/2019 1522   HDL 64 08/25/2019 1522   CHOLHDL 3.6 08/25/2019 1522   CHOLHDL 3.9 12/30/2018 0943   VLDL 28 12/30/2018 0943   LDLCALC 141 (H) 08/25/2019 1522   LDLDIRECT 127.6  05/15/2013 1422      Wt Readings from Last 3 Encounters:  08/25/19 135 lb 6.4 oz (61.4 kg)  05/30/19 129 lb 12.8 oz (58.9 kg)  05/21/19 127 lb 12.8 oz (58 kg)      ASSESSMENT AND PLAN:  1 CAD REcent cath in JU[place] with significant progression of CAD   Now s/p intervention to LAD and RCA    SHe is now only on ASA   She has had extreme sensitivities to meds.    Brilinta stopped in the fall  Reviewed with interventional service.    For now I would recomm continued aspirin    2  Pulmonary.   Pt is improved from the fall   Currently on prednisone taper    3    HTN    BP is OK    4  HL  Intolerant to statins  Will get labs today     5  Fatigue   Will check CBC and TSH   Also VIt D    F/U in 6 months    Current medicines are reviewed at length with the patient today.  The patient does not have concerns regarding medicines.  Signed, Dorris Carnes, MD  08/26/2019 6:52 PM    [address], [place], [place]  [address] Phone: [phone]; Fax: [phone]

## 2019-08-25 ENCOUNTER — Encounter: Payer: Self-pay | Admitting: Internal Medicine

## 2019-08-25 ENCOUNTER — Other Ambulatory Visit: Payer: Self-pay

## 2019-08-25 ENCOUNTER — Ambulatory Visit (INDEPENDENT_AMBULATORY_CARE_PROVIDER_SITE_OTHER): Admitting: Internal Medicine

## 2019-08-25 VITALS — BP 136/48 | HR 80 | Ht 62.0 in | Wt 135.4 lb

## 2019-08-25 DIAGNOSIS — Z794 Long term (current) use of insulin: Secondary | ICD-10-CM | POA: Diagnosis not present

## 2019-08-25 DIAGNOSIS — Z950 Presence of cardiac pacemaker: Secondary | ICD-10-CM | POA: Diagnosis not present

## 2019-08-25 DIAGNOSIS — K219 Gastro-esophageal reflux disease without esophagitis: Secondary | ICD-10-CM | POA: Diagnosis not present

## 2019-08-25 DIAGNOSIS — M1990 Unspecified osteoarthritis, unspecified site: Secondary | ICD-10-CM | POA: Diagnosis not present

## 2019-08-25 DIAGNOSIS — E119 Type 2 diabetes mellitus without complications: Secondary | ICD-10-CM | POA: Diagnosis not present

## 2019-08-25 DIAGNOSIS — I2510 Atherosclerotic heart disease of native coronary artery without angina pectoris: Secondary | ICD-10-CM

## 2019-08-25 DIAGNOSIS — R0602 Shortness of breath: Secondary | ICD-10-CM

## 2019-08-25 DIAGNOSIS — Z9861 Coronary angioplasty status: Secondary | ICD-10-CM | POA: Diagnosis not present

## 2019-08-25 DIAGNOSIS — K225 Diverticulum of esophagus, acquired: Secondary | ICD-10-CM | POA: Diagnosis not present

## 2019-08-25 DIAGNOSIS — E782 Mixed hyperlipidemia: Secondary | ICD-10-CM | POA: Diagnosis not present

## 2019-08-25 DIAGNOSIS — Z8673 Personal history of transient ischemic attack (TIA), and cerebral infarction without residual deficits: Secondary | ICD-10-CM | POA: Diagnosis not present

## 2019-08-25 DIAGNOSIS — R7989 Other specified abnormal findings of blood chemistry: Secondary | ICD-10-CM

## 2019-08-25 DIAGNOSIS — Z1321 Encounter for screening for nutritional disorder: Secondary | ICD-10-CM | POA: Diagnosis not present

## 2019-08-25 DIAGNOSIS — Z87891 Personal history of nicotine dependence: Secondary | ICD-10-CM | POA: Diagnosis not present

## 2019-08-25 DIAGNOSIS — E559 Vitamin D deficiency, unspecified: Secondary | ICD-10-CM | POA: Diagnosis not present

## 2019-08-25 DIAGNOSIS — M349 Systemic sclerosis, unspecified: Secondary | ICD-10-CM | POA: Diagnosis not present

## 2019-08-25 DIAGNOSIS — J8489 Other specified interstitial pulmonary diseases: Secondary | ICD-10-CM | POA: Diagnosis not present

## 2019-08-25 DIAGNOSIS — H3530 Unspecified macular degeneration: Secondary | ICD-10-CM | POA: Diagnosis not present

## 2019-08-25 DIAGNOSIS — J449 Chronic obstructive pulmonary disease, unspecified: Secondary | ICD-10-CM | POA: Diagnosis not present

## 2019-08-25 NOTE — Patient Instructions (Signed)
Medication Instructions:  No changes *If you need a refill on your cardiac medications before your next appointment, please call your pharmacy*   Lab Work: Today: cbc, bmet, tsh, lipids, vit d If you have labs (blood work) drawn today and your tests are completely normal, you will receive your results only by: [name] MyChart Message (if you have MyChart) OR . A paper copy in the mail If you have any lab test that is abnormal or we need to change your treatment, we will call you to review the results.   Testing/Procedures: none   Follow-Up: At [facility], you and your health needs are our priority.  As part of our continuing mission to provide you with exceptional heart care, we have created designated Provider Care Teams.  These Care Teams include your primary Cardiologist (physician) and Advanced Practice Providers (APPs -  Physician Assistants and Nurse Practitioners) who all work together to provide you with the care you need, when you need it.  We recommend signing up for the patient portal called "MyChart".  Sign up information is provided on this After Visit Summary.  MyChart is used to connect with patients for Virtual Visits (Telemedicine).  Patients are able to view lab/test results, encounter notes, upcoming appointments, etc.  Non-urgent messages can be sent to your provider as well.   To learn more about what you can do with MyChart, go to [email or web address].    Your next appointment:   6 month(s)  The format for your next appointment:   In Person  Provider:   Dorris Carnes, MD   Other Instructions We will contact Dr. Juanetta Gosling office to help schedule your follow up in [place] [facility].

## 2019-08-26 LAB — BASIC METABOLIC PANEL
BUN/Creatinine Ratio: 27 (ref 12–28)
BUN: 24 mg/dL (ref 8–27)
CO2: 27 mmol/L (ref 20–29)
Calcium: 8.6 mg/dL — ABNORMAL LOW (ref 8.7–10.3)
Chloride: 90 mmol/L — ABNORMAL LOW (ref 96–106)
Creatinine, Ser: 0.88 mg/dL (ref 0.57–1.00)
GFR calc Af Amer: 69 mL/min/{1.73_m2} (ref >59)
GFR calc non Af Amer: 60 mL/min/{1.73_m2} (ref >59)
Glucose: 366 mg/dL — ABNORMAL HIGH (ref 65–99)
Potassium: 4.7 mmol/L (ref 3.5–5.2)
Sodium: 133 mmol/L — ABNORMAL LOW (ref 134–144)

## 2019-08-26 LAB — LIPID PANEL
Chol/HDL Ratio: 3.6 ratio (ref 0.0–4.4)
Cholesterol, Total: 231 mg/dL — ABNORMAL HIGH (ref 100–199)
HDL: 64 mg/dL (ref >39)
LDL Chol Calc (NIH): 141 mg/dL — ABNORMAL HIGH (ref 0–99)
Triglycerides: 147 mg/dL (ref 0–149)
VLDL Cholesterol Cal: 26 mg/dL (ref 5–40)

## 2019-08-26 LAB — CBC
Hematocrit: 36.9 % (ref 34.0–46.6)
Hemoglobin: 12 g/dL (ref 11.1–15.9)
MCH: 27.3 pg (ref 26.6–33.0)
MCHC: 32.5 g/dL (ref 31.5–35.7)
MCV: 84 fL (ref 79–97)
Platelets: 265 10*3/uL (ref 150–450)
RBC: 4.39 x10E6/uL (ref 3.77–5.28)
RDW: 14.1 % (ref 11.7–15.4)
WBC: 12 10*3/uL — ABNORMAL HIGH (ref 3.4–10.8)

## 2019-08-26 LAB — VITAMIN D 25 HYDROXY (VIT D DEFICIENCY, FRACTURES): Vit D, 25-Hydroxy: 8.2 ng/mL — ABNORMAL LOW (ref 30.0–100.0)

## 2019-08-26 LAB — TSH: TSH: 0.471 u[IU]/mL (ref 0.450–4.500)

## 2019-08-28 DIAGNOSIS — I2510 Atherosclerotic heart disease of native coronary artery without angina pectoris: Secondary | ICD-10-CM | POA: Diagnosis not present

## 2019-08-28 DIAGNOSIS — J449 Chronic obstructive pulmonary disease, unspecified: Secondary | ICD-10-CM | POA: Diagnosis not present

## 2019-08-28 DIAGNOSIS — Z9861 Coronary angioplasty status: Secondary | ICD-10-CM | POA: Diagnosis not present

## 2019-08-28 DIAGNOSIS — M349 Systemic sclerosis, unspecified: Secondary | ICD-10-CM | POA: Diagnosis not present

## 2019-08-28 DIAGNOSIS — Z950 Presence of cardiac pacemaker: Secondary | ICD-10-CM | POA: Diagnosis not present

## 2019-08-28 DIAGNOSIS — J8489 Other specified interstitial pulmonary diseases: Secondary | ICD-10-CM | POA: Diagnosis not present

## 2019-09-01 DIAGNOSIS — M349 Systemic sclerosis, unspecified: Secondary | ICD-10-CM | POA: Diagnosis not present

## 2019-09-01 DIAGNOSIS — J449 Chronic obstructive pulmonary disease, unspecified: Secondary | ICD-10-CM | POA: Diagnosis not present

## 2019-09-01 DIAGNOSIS — Z950 Presence of cardiac pacemaker: Secondary | ICD-10-CM | POA: Diagnosis not present

## 2019-09-01 DIAGNOSIS — I2510 Atherosclerotic heart disease of native coronary artery without angina pectoris: Secondary | ICD-10-CM | POA: Diagnosis not present

## 2019-09-01 DIAGNOSIS — J8489 Other specified interstitial pulmonary diseases: Secondary | ICD-10-CM | POA: Diagnosis not present

## 2019-09-01 DIAGNOSIS — Z9861 Coronary angioplasty status: Secondary | ICD-10-CM | POA: Diagnosis not present

## 2019-09-03 ENCOUNTER — Ambulatory Visit (INDEPENDENT_AMBULATORY_CARE_PROVIDER_SITE_OTHER): Payer: Medicare Other | Admitting: Pulmonary Disease

## 2019-09-03 ENCOUNTER — Other Ambulatory Visit: Payer: Self-pay

## 2019-09-03 ENCOUNTER — Encounter: Payer: Self-pay | Admitting: Pulmonary Disease

## 2019-09-03 VITALS — BP 126/64 | HR 81 | Temp 98.5°F | Ht 62.0 in | Wt 138.1 lb

## 2019-09-03 DIAGNOSIS — J209 Acute bronchitis, unspecified: Secondary | ICD-10-CM | POA: Diagnosis not present

## 2019-09-03 DIAGNOSIS — M349 Systemic sclerosis, unspecified: Secondary | ICD-10-CM

## 2019-09-03 DIAGNOSIS — J8489 Other specified interstitial pulmonary diseases: Secondary | ICD-10-CM | POA: Diagnosis not present

## 2019-09-03 DIAGNOSIS — Z950 Presence of cardiac pacemaker: Secondary | ICD-10-CM | POA: Diagnosis not present

## 2019-09-03 DIAGNOSIS — Z9861 Coronary angioplasty status: Secondary | ICD-10-CM | POA: Diagnosis not present

## 2019-09-03 DIAGNOSIS — J449 Chronic obstructive pulmonary disease, unspecified: Secondary | ICD-10-CM | POA: Diagnosis not present

## 2019-09-03 DIAGNOSIS — I2510 Atherosclerotic heart disease of native coronary artery without angina pectoris: Secondary | ICD-10-CM | POA: Diagnosis not present

## 2019-09-03 MED ORDER — AMOXICILLIN-POT CLAVULANATE 875-125 MG PO TABS: 1.0000 | ORAL_TABLET | Freq: Two times a day (BID) | ORAL | 0 refills | Status: DC

## 2019-09-03 NOTE — Progress Notes (Signed)
'@Patient'  ID: [name], female    DOB: [birth year], 86 y.o.   MRN: [id number]  Chief Complaint  Patient presents with  . Follow-up    3 month f/u for COPD. States she developed a deep cough a week ago with green phlegm.     Referring provider: Merrilee Seashore, MD  HPI:  86 year old female former smoker followed in our office for abnormal CT (probable Boop), emphysema, suspected HP on CT  PMH: Scleroderma (managed by Dr. Estanislado Pandy), chronic sinusitis (managed by Dr. Lucia Gaskins with ENT) Smoker/ Smoking History: Former smoker. Quit 1989. 80-pack-year smoking history. Maintenance: Incruse Ellipta, 5 mg prednisone Pt of: Dr. Halford Chessman  09/03/2019  - Visit   86 year old female former smoker followed in our office for history of Boop, suspected hypersensitivity pneumonitis, emphysema.  Patient also has history of frequent sinus infections, scleroderma as well as chronic rhinitis.  Patient was last seen in December/2020 with Dr. Halford Chessman.  She also had a course of a worsened cough where prednisone was increased in February/2021.  Patient presenting to office today reporting that for the last week she has had increased productive croupy cough with thick green mucus.  Due to her increased wheezing home health did recommend patient to go ahead and increase prednisone to 15 mg daily.  Patient did have occasional bouts of worsened acid reflux prior to this.  Patient is also had intermittent fevers.  Patient with productive cough with thick green mucus.   Questionaires / Pulmonary Flowsheets:   MMRC: mMRC Dyspnea Scale mMRC Score  09/03/2019 0    Tests:   Pulmonary tests:  August 2010 >> Bronchoscopy PFT 06/16/09 >> FEV1 1.99(118%), FEV1% 73, TLC 4.60(105%), DLCO 66%, no BD Serology 05/16/19 >> ANA positive, 1:640 centromere Ab, HP negative, Scl 70 < 1, RF < 14, ESR 54  Chest imaging:  CT chest 11/24/11 >> centrilobular emphysema, RUL ASD CT sinus 07/30/13 >> opacification of Lt maxillary  sinus, rightward NSD CT sinus 10/05/15 >> b/l mucosal edema paranasal sinuses, obstruction of ostium Rt maxillary sinus CT angio chest 07/09/17 >> GGO RUL HRCT 05/14/19 >> atherosclerosis, mild emphysema, 1.2 cm RUL opacity, patchy confluent GGO b/l with centrilobular micronodular character, moderate patchy air trapping  Cardiac tests:  Echo 12/28/18 >> EF 60 to 65%, mild LVH, normal RV systolic fx LHC 9/32/35 >> severe 3 vessel CAD    FENO:  No results found for: NITRICOXIDE  PFT: No flowsheet data found.  WALK:  SIX MIN WALK 05/16/2019 07/17/2018  Supplimental Oxygen during Test? (L/min) No No  Tech Comments: Patient was unable to complete 3 laps due to weakness of legs. Patient had to stop 6 times during the walk to sit because she felt like her legs were "going out" on her. No issues with oxygen saturations during walk.//btr no desaturations with walk 160 meters/2 laps No complaints of SOB or pain TA/CAM    Imaging: No results found.  Lab Results:  CBC    Component Value Date/Time   WBC 12.0 (H) 08/25/2019 1522   WBC 9.7 05/05/2019 1654   RBC 4.39 08/25/2019 1522   RBC 4.64 05/05/2019 1654   HGB 12.0 08/25/2019 1522   HCT 36.9 08/25/2019 1522   PLT 265 08/25/2019 1522   MCV 84 08/25/2019 1522   MCH 27.3 08/25/2019 1522   MCH 28.2 04/26/2019 1413   MCHC 32.5 08/25/2019 1522   MCHC 34.0 05/05/2019 1654   RDW 14.1 08/25/2019 1522   LYMPHSABS 1.0 05/05/2019 1654   MONOABS  0.8 05/05/2019 1654   EOSABS 0.0 05/05/2019 1654   BASOSABS 0.1 05/05/2019 1654    BMET    Component Value Date/Time   NA 133 (L) 08/25/2019 1522   K 4.7 08/25/2019 1522   CL 90 (L) 08/25/2019 1522   CO2 27 08/25/2019 1522   GLUCOSE 366 (H) 08/25/2019 1522   GLUCOSE 441 (H) 05/05/2019 1654   BUN 24 08/25/2019 1522   CREATININE 0.88 08/25/2019 1522   CREATININE 0.80 03/30/2016 1042   CALCIUM 8.6 (L) 08/25/2019 1522   GFRNONAA 60 08/25/2019 1522   GFRNONAA 64 01/22/2015 1635   GFRAA 69  08/25/2019 1522   GFRAA 74 01/22/2015 1635    BNP    Component Value Date/Time   BNP 43.7 01/28/2019 1655    ProBNP    Component Value Date/Time   PROBNP 61.0 05/05/2019 1654    Specialty Problems      Pulmonary Problems   Chronic rhinitis           EMPHYSEMA     No clinical benefit from inhaler therapy Quit smoking 1999        BOOP (bronchiolitis obliterans with organizing pneumonia) ([place])           COPD (chronic obstructive pulmonary disease) ([place])   CAP (community acquired pneumonia)   Acute bronchitis   HCAP (healthcare-associated pneumonia)   Hypoxia   SOB (shortness of breath), secondary to Boop   Multifocal pneumonia   Recurrent sinus infections   Dyspnea   Chronic sinusitis      Allergies  Allergen Reactions  . Flomax [Tamsulosin Hcl] Other (See Comments)    Made her 'trachea feel tight and couldn't swallow'  . Peanut Oil Anaphylaxis, Swelling, Rash and Other (See Comments)    Throat tightens, also  . Sulfa Antibiotics Anaphylaxis       . Titanium Other (See Comments)    Neck wouldn't heal with titanium cervical plate  . Yellow Dye Other (See Comments)    "Bee stings" all over body when eating/taking medications with red or yellow dye   . Doxycycline Other (See Comments)    Per pt, caused oral thrush and loss of appetite  . Lantus [Insulin Glargine] Other (See Comments)    Patient states "sulfate binder causes sinus infection/pneumonia".   Hook [Levofloxacin In D5w] Other (See Comments)    Muscle aches  . Metoprolol-Hydrochlorothiazide Other (See Comments)    Decreased B/P  . Norvasc [Amlodipine] Swelling and Other (See Comments)    BIL Ankle edema  . Novolog [Insulin Aspart]     Patient states "sulfate binder causes sinus infection/pneumonia". Tolerates Humalog.  [name] Penciclovir Other (See Comments)    Muscle Pain  . Red Dye Other (See Comments)    Burning sensation  . Senna [Sennosides] Other (See Comments)    Pt states that  it causes a "stinging" sensation  . Statins Other (See Comments)    All of them make me "feel badly"  . Sulfonamide Derivatives Hives  . Adhesive [Tape] Hives and Rash  . Maxidex [Dexamethasone] Anxiety and Other (See Comments)    Insomnia and dizziness, too    Immunization History  Administered Date(s) Administered  . Influenza Split 07/11/2011, 03/31/2015  . Influenza, High Dose Seasonal PF 07/14/2017, 02/25/2019  . Influenza,inj,Quad PF,6+ Mos 07/18/2013, 03/24/2016  . Influenza-Unspecified 03/26/2018  . Pneumococcal Polysaccharide-23 07/18/2013    Past Medical History:  Diagnosis Date  . Adenomatous colon polyp   . Arthritis   . Blood transfusion   .  Cancer [facility])    cervical cancer pre hysterectomy  . Cervical disc disease   . Chronic sinusitis   . CONSTIPATION, CHRONIC 06/26/2007   Qualifier: Diagnosis of  By: Nils Pyle CMA ([place]), Mearl Latin    . COPD (chronic obstructive pulmonary disease) ([place])   . Coronary artery disease    prior MI with stenting of RCA with repeat PTCA/stenting for ISR in 01/2008, stent to LM 11/2008  . CVA (cerebral infarction) 1991  . Depression   . Diabetes mellitus   . Diverticulosis   . 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 Dyslipidemia   . Emphysema   . Fibromyalgia   . GERD (gastroesophageal reflux disease)   . Hypertension   . Hypothyroidism   . IBS (irritable bowel syndrome)   . Legally blind   . Macular degeneration   . Myocardial infarction [facility]) may 27th 2007  . Pneumonia 07/16/2013    HX PNEUMONIA  . Pulmonary hypertension ([place])   . Raynaud's phenomenon   . Scleroderma ([place])   . Sjogren's disease ([place])   . Sleep apnea    STOP BANG SCORE 5  . Stroke [facility]) 1990   brain stem stroke - weakness rt hand  . Zenker's diverticulum     Tobacco History: Social History   Tobacco Use  Smoking Status Former Smoker  . Packs/day: 2.00  . Years: 40.00  . Pack years: 80.00  . Types: Cigarettes  . Start date: 66  . Quit date: 55  . Years  since quitting: 32.2  Smokeless Tobacco Never Used   Counseling given: Yes   Continue to not smoke  Outpatient Encounter Medications as of 09/03/2019  Medication Sig  . amLODipine (NORVASC) 2.5 MG tablet Take 1 tablet (2.5 mg total) by mouth daily.  [name] aspirin EC 81 MG tablet Take 1 tablet (81 mg total) by mouth daily.  . cetirizine (ZYRTEC) 10 MG tablet Take 10 mg by mouth daily as needed for allergies.  [name] esomeprazole (NEXIUM) 40 MG capsule Take 40 mg by mouth daily.   . fluticasone (FLONASE) 50 MCG/ACT nasal spray Place 1 spray into both nostrils daily.  . furosemide (LASIX) 20 MG tablet Take 1 tablet (20 mg total) by mouth daily.  [name] HYDROcodone-acetaminophen (NORCO) 7.5-325 MG tablet Take 0.5-1 tablets by mouth every 6 (six) hours as needed for moderate pain. 10m-325mg  . imipramine (TOFRANIL) 25 MG tablet Take 75 mg by mouth at bedtime.   . insulin lispro (HUMALOG KWIKPEN) 100 UNIT/ML KwikPen Inject 5 Units into the skin 3 (three) times daily.   .Marland Kitchenlevothyroxine (SYNTHROID, LEVOTHROID) 100 MCG tablet Take 100 mcg by mouth daily before breakfast.  . LORazepam (ATIVAN) 0.5 MG tablet Take 0.5 mg by mouth 2 (two) times daily as needed.  . montelukast (SINGULAIR) 10 MG tablet Take 10 mg by mouth at bedtime.   . potassium chloride 20 MEQ TBCR Take 20 mEq by mouth daily.  . predniSONE (DELTASONE) 5 MG tablet Take 15 mg by mouth daily with breakfast.   . triazolam (HALCION) 0.25 MG tablet Take 0.25 mg by mouth at bedtime.   .Marland Kitchenumeclidinium bromide (INCRUSE ELLIPTA) 62.5 MCG/INH AEPB Inhale 1 puff into the lungs daily.  .Marland Kitchenamoxicillin-clavulanate (AUGMENTIN) 875-125 MG tablet Take 1 tablet by mouth 2 (two) times daily.  . [DISCONTINUED] predniSONE (DELTASONE) 20 MG tablet Take 2 tablets (40 mg total) by mouth daily with breakfast. (Patient not taking: Reported on 08/25/2019)   No facility-administered encounter medications on file as of 09/03/2019.  Review of Systems  Review of Systems   Constitutional: Positive for fatigue and fever. Negative for activity change.  HENT: Positive for congestion. Negative for sinus pressure, sinus pain and sore throat.   Respiratory: Positive for cough and shortness of breath. Negative for wheezing.   Cardiovascular: Negative for chest pain, palpitations and leg swelling.  Gastrointestinal: Negative for diarrhea, nausea and vomiting.  Musculoskeletal: Negative for arthralgias.  Neurological: Negative for dizziness.  Psychiatric/Behavioral: Negative for sleep disturbance. The patient is not nervous/anxious.      Physical Exam  BP 126/64 (BP Location: Right Arm, Patient Position: Sitting, Cuff Size: Normal)   Pulse 81   Temp 98.5 F (36.9 C) (Temporal)   Ht '5\' 2"'  (1.575 m)   Wt 138 lb 1.6 oz (62.6 kg)   SpO2 97% Comment: on RA  BMI 25.26 kg/m   Wt Readings from Last 5 Encounters:  09/03/19 138 lb 1.6 oz (62.6 kg)  08/25/19 135 lb 6.4 oz (61.4 kg)  05/30/19 129 lb 12.8 oz (58.9 kg)  05/21/19 127 lb 12.8 oz (58 kg)  05/16/19 128 lb 6.4 oz (58.2 kg)    BMI Readings from Last 5 Encounters:  09/03/19 25.26 kg/m  08/25/19 24.76 kg/m  05/30/19 23.74 kg/m  05/21/19 23.37 kg/m  05/16/19 23.48 kg/m     Physical Exam Vitals and nursing note reviewed.  Constitutional:      General: She is not in acute distress.    Appearance: Normal appearance. She is normal weight.     Comments: Chronically ill frail female  HENT:     Head: Normocephalic and atraumatic.     Right Ear: Tympanic membrane, ear canal and external ear normal. There is no impacted cerumen.     Left Ear: Tympanic membrane, ear canal and external ear normal. There is no impacted cerumen.     Nose: Nose normal.     Mouth/Throat:     Mouth: Mucous membranes are moist.     Pharynx: Oropharynx is clear.  Eyes:     Pupils: Pupils are equal, round, and reactive to light.  Cardiovascular:     Rate and Rhythm: Normal rate and regular rhythm.     Pulses: Normal  pulses.     Heart sounds: Normal heart sounds. No murmur.  Pulmonary:     Effort: Pulmonary effort is normal. No respiratory distress.     Breath sounds: No decreased air movement. No decreased breath sounds, wheezing, rhonchi or rales.  Musculoskeletal:     Cervical back: Normal range of motion.     Right lower leg: Edema (Trace) present.     Left lower leg: No edema.  Skin:    General: Skin is warm and dry.     Capillary Refill: Capillary refill takes less than 2 seconds.  Neurological:     General: No focal deficit present.     Mental Status: She is alert and oriented to person, place, and time. Mental status is at baseline.     Gait: Gait normal.  Psychiatric:        Mood and Affect: Mood normal.        Behavior: Behavior normal.        Thought Content: Thought content normal.        Judgment: Judgment normal.       Assessment & Plan:   Acute bronchitis Plan: Augmentin today Continue prednisone 50 mg daily for next 7 days, then transition to 10 mg daily for 7 days, then return to  5 mg baseline 2-week follow-up with our office  COPD (chronic obstructive pulmonary disease) ([place]) Plan: Continue Incruse Ellipta 2-week follow-up  Scleroderma ([place]) Plan: Continue prednisone prednisone taper as outlined above, then resume 5 mg prednisone daily Continue to follow-up with rheumatology     Return in about 2 weeks (around 09/17/2019), or if symptoms worsen or fail to improve, for Follow up with Dr. Halford Chessman, Follow up with [name] FNP-C, Follow up with Eric Form ACAGNP-BC.   Lauraine Rinne, NP 09/03/2019   This appointment required 34 minutes of patient care (this includes precharting, chart review, review of results, face-to-face care, etc.).

## 2019-09-03 NOTE — Assessment & Plan Note (Signed)
Plan: Continue Incruse Ellipta 2-week follow-up

## 2019-09-03 NOTE — Progress Notes (Signed)
Reviewed and agree with assessment/plan.   Kaitlinn Iversen, MD [facility] 06/21/2016, 12:24 PM Pager:  [phone]  

## 2019-09-03 NOTE — Assessment & Plan Note (Signed)
Plan: Continue prednisone prednisone taper as outlined above, then resume 5 mg prednisone daily Continue to follow-up with rheumatology

## 2019-09-03 NOTE — Assessment & Plan Note (Signed)
Plan: Augmentin today Continue prednisone 50 mg daily for next 7 days, then transition to 10 mg daily for 7 days, then return to 5 mg baseline 2-week follow-up with our office

## 2019-09-03 NOTE — Patient Instructions (Addendum)
You were seen today by Lauraine Rinne, NP  for:   Nice seeing you today glad that you came into our office.  We will treat you with antibiotics.  Agreed with increased prednisone dosing.  We will have you take 15 mg of prednisone daily for the next 7 days then 10 mg for the next 7 days, then resume daily 5 mg dose.  We will have close follow-up with our office in 2 weeks.  Take the Augmentin as prescribed.  Take care and stay safe,  [name]  1. Chronic obstructive pulmonary disease, unspecified COPD type ([place])  Continue to take 15 mg of prednisone daily for the next 7 days then decrease down to 10 mg of prednisone daily for the following 7 days, then resume daily 5 mg dosing  Incruse Ellipta  >>> Take 1 puff daily in the morning right when you wake up >>>Rinse your mouth out after use >>>This is a daily maintenance inhaler, NOT a rescue inhaler >>>Contact our office if you are having difficulties affording or obtaining this medication >>>It is important for you to be able to take this daily and not miss any doses    Note your daily symptoms > remember "red flags" for COPD:   >>>Increase in cough >>>increase in sputum production >>>increase in shortness of breath or activity  intolerance.   If you notice these symptoms, please call the office to be seen.    2. Acute bronchitis, unspecified organism  - amoxicillin-clavulanate (AUGMENTIN) 875-125 MG tablet; Take 1 tablet by mouth 2 (two) times daily.  Dispense: 14 tablet; Refill: 0   We recommend today:   Meds ordered this encounter  Medications  . amoxicillin-clavulanate (AUGMENTIN) 875-125 MG tablet    Sig: Take 1 tablet by mouth 2 (two) times daily.    Dispense:  14 tablet    Refill:  0    Follow Up:    Return in about 2 weeks (around 09/17/2019), or if symptoms worsen or fail to improve, for Follow up with Dr. Halford Chessman, Follow up with [name] FNP-C, Follow up with Eric Form ACAGNP-BC.   Please do your part to reduce the  spread of COVID-19:      Reduce your risk of any infection  and COVID19 by using the similar precautions used for avoiding the common cold or flu:  [name] Wash your hands often with soap and warm water for at least 20 seconds.  If soap and water are not readily available, use an alcohol-based hand sanitizer with at least 60% alcohol.  . If coughing or sneezing, cover your mouth and nose by coughing or sneezing into the elbow areas of your shirt or coat, into a tissue or into your sleeve (not your hands). Langley Gauss A MASK when in public  . Avoid shaking hands with others and consider head nods or verbal greetings only. . Avoid touching your eyes, nose, or mouth with unwashed hands.  . Avoid close contact with people who are sick. . Avoid places or events with large numbers of people in one location, like concerts or sporting events. . If you have some symptoms but not all symptoms, continue to monitor at home and seek medical attention if your symptoms worsen. . If you are having a medical emergency, call 911.   [place] / e-Visit: [email or web address]         [facility]: [facility]: [phone]  [facility]: [phone]     It is flu season:   >>> Best ways to protect herself from the flu: Receive the yearly flu vaccine, practice good hand hygiene washing with soap and also using hand sanitizer when available, eat a nutritious meals, get adequate rest, hydrate appropriately   Please contact the office if your symptoms worsen or you have concerns that you are not improving.   Thank you for choosing [facility] for your healthcare, and for allowing [place] to partner with you on your healthcare journey. I am thankful to be able to provide care to you today.   [name] FNP-C

## 2019-09-04 DIAGNOSIS — J449 Chronic obstructive pulmonary disease, unspecified: Secondary | ICD-10-CM | POA: Diagnosis not present

## 2019-09-04 DIAGNOSIS — I2510 Atherosclerotic heart disease of native coronary artery without angina pectoris: Secondary | ICD-10-CM | POA: Diagnosis not present

## 2019-09-04 DIAGNOSIS — M349 Systemic sclerosis, unspecified: Secondary | ICD-10-CM | POA: Diagnosis not present

## 2019-09-04 DIAGNOSIS — J8489 Other specified interstitial pulmonary diseases: Secondary | ICD-10-CM | POA: Diagnosis not present

## 2019-09-04 DIAGNOSIS — Z9861 Coronary angioplasty status: Secondary | ICD-10-CM | POA: Diagnosis not present

## 2019-09-04 DIAGNOSIS — Z950 Presence of cardiac pacemaker: Secondary | ICD-10-CM | POA: Diagnosis not present

## 2019-09-11 DIAGNOSIS — Z950 Presence of cardiac pacemaker: Secondary | ICD-10-CM | POA: Diagnosis not present

## 2019-09-11 DIAGNOSIS — Z9861 Coronary angioplasty status: Secondary | ICD-10-CM | POA: Diagnosis not present

## 2019-09-11 DIAGNOSIS — M349 Systemic sclerosis, unspecified: Secondary | ICD-10-CM | POA: Diagnosis not present

## 2019-09-11 DIAGNOSIS — J8489 Other specified interstitial pulmonary diseases: Secondary | ICD-10-CM | POA: Diagnosis not present

## 2019-09-11 DIAGNOSIS — I2510 Atherosclerotic heart disease of native coronary artery without angina pectoris: Secondary | ICD-10-CM | POA: Diagnosis not present

## 2019-09-11 DIAGNOSIS — J449 Chronic obstructive pulmonary disease, unspecified: Secondary | ICD-10-CM | POA: Diagnosis not present

## 2019-09-17 ENCOUNTER — Other Ambulatory Visit: Payer: Self-pay | Admitting: Cardiology

## 2019-09-17 ENCOUNTER — Encounter: Payer: Self-pay | Admitting: Pulmonary Disease

## 2019-09-17 ENCOUNTER — Ambulatory Visit (INDEPENDENT_AMBULATORY_CARE_PROVIDER_SITE_OTHER): Payer: 59 | Admitting: Pulmonary Disease

## 2019-09-17 ENCOUNTER — Other Ambulatory Visit: Payer: Self-pay

## 2019-09-17 ENCOUNTER — Telehealth: Payer: Self-pay | Admitting: Pulmonary Disease

## 2019-09-17 VITALS — BP 130/62 | HR 78 | Temp 98.4°F | Ht 62.0 in | Wt 138.0 lb

## 2019-09-17 DIAGNOSIS — J8489 Other specified interstitial pulmonary diseases: Secondary | ICD-10-CM

## 2019-09-17 DIAGNOSIS — J209 Acute bronchitis, unspecified: Secondary | ICD-10-CM

## 2019-09-17 DIAGNOSIS — Z9861 Coronary angioplasty status: Secondary | ICD-10-CM | POA: Diagnosis not present

## 2019-09-17 DIAGNOSIS — J449 Chronic obstructive pulmonary disease, unspecified: Secondary | ICD-10-CM | POA: Diagnosis not present

## 2019-09-17 DIAGNOSIS — M349 Systemic sclerosis, unspecified: Secondary | ICD-10-CM

## 2019-09-17 DIAGNOSIS — I2510 Atherosclerotic heart disease of native coronary artery without angina pectoris: Secondary | ICD-10-CM | POA: Diagnosis not present

## 2019-09-17 DIAGNOSIS — Z950 Presence of cardiac pacemaker: Secondary | ICD-10-CM | POA: Diagnosis not present

## 2019-09-17 MED ORDER — PREDNISONE 5 MG PO TABS: ORAL_TABLET | ORAL | 1 refills | Status: AC

## 2019-09-17 MED ORDER — PREDNISONE 5 MG PO TABS: 10.0000 mg | ORAL_TABLET | Freq: Every day | ORAL | 1 refills | Status: DC

## 2019-09-17 NOTE — Assessment & Plan Note (Signed)
Continue prednisone as outlined on AVS, return to baseline 5 mg daily Refilled prednisone today Continue follow-up with rheumatology

## 2019-09-17 NOTE — Progress Notes (Signed)
Reviewed and agree with assessment/plan.   Tarell Schollmeyer, MD [facility] 06/21/2016, 12:24 PM Pager:  [phone]  

## 2019-09-17 NOTE — Assessment & Plan Note (Signed)
Plan: Finish 10 mg of daily prednisone over the next few days, then resume 5 mg prednisone daily dosing

## 2019-09-17 NOTE — Patient Instructions (Addendum)
You were seen today by Renee Rinne, NP  for:   Nice seeing you today glad that you came into our office.  Glad you are feeling better.  Keep up your hard work with your physical exercise.  We will refer you to pulmonary rehab today.  Continue your other medications.  Take care and stay safe,  [name]  1. Chronic obstructive pulmonary disease, unspecified COPD type (H[facility])  Continue 10 mg of prednisone daily for the following 7 days, then resume daily 5 mg dosing  Incruse Ellipta  >>> Take 1 puff daily in the morning right when you wake up >>>Rinse your mouth out after use >>>This is a daily maintenance inhaler, NOT a rescue inhaler >>>Contact our office if you are having difficulties affording or obtaining this medication >>>It is important for you to be able to take this daily and not miss any doses    Note your daily symptoms > remember "red flags" for COPD:   >>>Increase in cough >>>increase in sputum production >>>increase in shortness of breath or activity  intolerance.   If you notice these symptoms, please call the office to be seen.   We will refer you today to pulmonary rehab   Follow Up:    Return in about 2 months (around 11/17/2019), or if symptoms worsen or fail to improve, for Follow up with Dr. Halford Chessman.   Please do your part to reduce the spread of COVID-19:      Reduce your risk of any infection  and COVID19 by using the similar precautions used for avoiding the common cold or flu:  [name] Wash your hands often with soap and warm water for at least 20 seconds.  If soap and water are not readily available, use an alcohol-based hand sanitizer with at least 60% alcohol.  . If coughing or sneezing, cover your mouth and nose by coughing or sneezing into the elbow areas of your shirt or coat, into a tissue or into your sleeve (not your hands). [name] A MASK when in public  . Avoid shaking hands with others and consider head nods or verbal greetings only. . Avoid touching  your eyes, nose, or mouth with unwashed hands.  . Avoid close contact with people who are sick. . Avoid places or events with large numbers of people in one location, like concerts or sporting events. . If you have some symptoms but not all symptoms, continue to monitor at home and seek medical attention if your symptoms worsen. . If you are having a medical emergency, call 911.   [place] / e-Visit: [email or web address]         [facility]: [facility]: [id number]                   [facility]: [id number]     It is flu season:   >>> Best ways to protect herself from the flu: Receive the yearly flu vaccine, practice good hand hygiene washing with soap and also using hand sanitizer when available, eat a nutritious meals, get adequate rest, hydrate appropriately   Please contact the office if your symptoms worsen or you have concerns that you are not improving.   Thank you for choosing [facility] for your healthcare, and for allowing [place] to partner with you on your healthcare journey. I am thankful to be able to provide care to you today.   Aaron Edelman  [facility] FNP-C

## 2019-09-17 NOTE — Progress Notes (Signed)
_0  ID: [name], female    DOB: 12-10-1930, 86 y.o.   MRN: [id number]  Chief Complaint  Patient presents with  . Follow-up    2 wk f/u     Referring provider: Merrilee Seashore, MD  HPI:  86 year old female former smoker followed in our office for abnormal CT (probable Boop), emphysema, suspected HP on CT  PMH: Scleroderma (managed by Dr. Estanislado Pandy), chronic sinusitis (managed by Dr. Lucia Gaskins with ENT) Smoker/ Smoking History: Former smoker. Quit 1989. 80-pack-year smoking history. Maintenance: Incruse Ellipta, 5 mg prednisone Pt of: Dr. Halford Chessman  09/17/2019  - Visit   86 year old female former smoker followed in our office for Boop, emphysema and suspected hypersensitivity pneumonitis on CT.  She also has scleroderma which is managed by rheumatology.  She is presenting to our office today as a 2-week follow-up from being treated for an acute sinusitis with Augmentin as well as a course of prednisone.  Patient reporting to our office today she is doing quite well.  She denies any sort of acute or worsening symptoms.  She feels that she is back to her baseline.  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  Questionaires / Pulmonary Flowsheets:   MMRC: mMRC Dyspnea Scale mMRC Score  09/17/2019 3  09/03/2019 0    Tests:   Pulmonary tests:  August 2010 >> Bronchoscopy PFT 06/16/09 >> FEV1 1.99(118%), FEV1% 73, TLC 4.60(105%), DLCO 66%, no BD Serology 05/16/19 >> ANA positive, 1:640 centromere Ab, HP negative, Scl 70 < 1, RF < 14, ESR 54  Chest imaging:  CT chest 11/24/11 >> centrilobular emphysema, RUL ASD CT sinus 07/30/13 >> opacification of Lt maxillary sinus, rightward NSD CT sinus 10/05/15 >> b/l mucosal edema paranasal sinuses, obstruction of ostium Rt maxillary sinus CT angio chest 07/09/17 >> GGO RUL HRCT 05/14/19 >> atherosclerosis, mild emphysema, 1.2 cm RUL opacity, patchy  confluent GGO b/l with centrilobular micronodular character, moderate patchy air trapping  Cardiac tests:  Echo 12/28/18 >> EF 60 to 65%, mild LVH, normal RV systolic fx LHC 2/59/56 >> severe 3 vessel CAD  FENO:  No results found for: NITRICOXIDE  PFT: No flowsheet data found.  WALK:  No flowsheet data found.  Imaging: No results found.  Lab Results:  CBC    Component Value Date/Time   WBC 12.0 (H) 08/25/2019 1522   WBC 9.7 05/05/2019 1654   RBC 4.39 08/25/2019 1522   RBC 4.64 05/05/2019 1654   HGB 12.0 08/25/2019 1522   HCT 36.9 08/25/2019 1522   PLT 265 08/25/2019 1522   MCV 84 08/25/2019 1522   MCH 27.3 08/25/2019 1522   MCH 28.2 04/26/2019 1413   MCHC 32.5 08/25/2019 1522   MCHC 34.0 05/05/2019 1654   RDW 14.1 08/25/2019 1522   LYMPHSABS 1.0 05/05/2019 1654   MONOABS 0.8 05/05/2019 1654   EOSABS 0.0 05/05/2019 1654   BASOSABS 0.1 05/05/2019 1654    BMET    Component Value Date/Time   NA 133 (L) 08/25/2019 1522   K 4.7 08/25/2019 1522   CL 90 (L) 08/25/2019 1522   CO2 27 08/25/2019 1522   GLUCOSE 366 (H) 08/25/2019 1522   GLUCOSE 441 (H) 05/05/2019 1654   BUN 24 08/25/2019 1522   CREATININE 0.88 08/25/2019 1522   CREATININE 0.80 03/30/2016 1042   CALCIUM 8.6 (L) 08/25/2019 1522   GFRNONAA 60 08/25/2019 1522   GFRNONAA 64 01/22/2015 1635  GFRAA 69 08/25/2019 1522   GFRAA 74 01/22/2015 1635    BNP    Component Value Date/Time   BNP 43.7 01/28/2019 1655    ProBNP    Component Value Date/Time   PROBNP 61.0 05/05/2019 1654    Specialty Problems      Pulmonary Problems   Chronic rhinitis           EMPHYSEMA     No clinical benefit from inhaler therapy Quit smoking 1999        BOOP (bronchiolitis obliterans with organizing pneumonia) ([place])           COPD (chronic obstructive pulmonary disease) ([place])   CAP (community acquired pneumonia)   Acute bronchitis   HCAP (healthcare-associated pneumonia)   Hypoxia   SOB (shortness  of breath), secondary to Boop   Multifocal pneumonia   Recurrent sinus infections   Dyspnea   Chronic sinusitis      Allergies  Allergen Reactions  . Flomax [Tamsulosin Hcl] Other (See Comments)    Made her 'trachea feel tight and couldn't swallow'  . Peanut Oil Anaphylaxis, Swelling, Rash and Other (See Comments)    Throat tightens, also  . Sulfa Antibiotics Anaphylaxis       . Titanium Other (See Comments)    Neck wouldn't heal with titanium cervical plate  . Yellow Dye Other (See Comments)    "Bee stings" all over body when eating/taking medications with red or yellow dye   . Doxycycline Other (See Comments)    Per pt, caused oral thrush and loss of appetite  . Lantus [Insulin Glargine] Other (See Comments)    Patient states "sulfate binder causes sinus infection/pneumonia".   Hook [Levofloxacin In D5w] Other (See Comments)    Muscle aches  . Metoprolol-Hydrochlorothiazide Other (See Comments)    Decreased B/P  . Norvasc [Amlodipine] Swelling and Other (See Comments)    BIL Ankle edema  . Novolog [Insulin Aspart]     Patient states "sulfate binder causes sinus infection/pneumonia". Tolerates Humalog.  [name] Penciclovir Other (See Comments)    Muscle Pain  . Red Dye Other (See Comments)    Burning sensation  . Senna [Sennosides] Other (See Comments)    Pt states that it causes a "stinging" sensation  . Statins Other (See Comments)    All of them make me "feel badly"  . Sulfonamide Derivatives Hives  . Adhesive [Tape] Hives and Rash  . Maxidex [Dexamethasone] Anxiety and Other (See Comments)    Insomnia and dizziness, too    Immunization History  Administered Date(s) Administered  . Influenza Split 07/11/2011, 03/31/2015  . Influenza, High Dose Seasonal PF 07/14/2017, 02/25/2019  . Influenza,inj,Quad PF,6+ Mos 07/18/2013, 03/24/2016  . Influenza-Unspecified 03/26/2018  . Pneumococcal Polysaccharide-23 07/18/2013    Past Medical History:  Diagnosis Date  .  Adenomatous colon polyp   . Arthritis   . Blood transfusion   . Cancer [facility])    cervical cancer pre hysterectomy  . Cervical disc disease   . Chronic sinusitis   . CONSTIPATION, CHRONIC 06/26/2007   Qualifier: Diagnosis of  By: Nils Pyle CMA ([place]), Mearl Latin    . COPD (chronic obstructive pulmonary disease) ([place])   . Coronary artery disease    prior MI with stenting of RCA with repeat PTCA/stenting for ISR in 01/2008, stent to LM 11/2008  . CVA (cerebral infarction) 1991  . Depression   . Diabetes mellitus   . Diverticulosis   . 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 Dyslipidemia   .  Emphysema   . Fibromyalgia   . GERD (gastroesophageal reflux disease)   . Hypertension   . Hypothyroidism   . IBS (irritable bowel syndrome)   . Legally blind   . Macular degeneration   . Myocardial infarction [facility]) may 27th 2007  . Pneumonia 07/16/2013    HX PNEUMONIA  . Pulmonary hypertension ([place])   . Raynaud's phenomenon   . Scleroderma ([place])   . Sjogren's disease ([place])   . Sleep apnea    STOP BANG SCORE 5  . Stroke [facility]) 1990   brain stem stroke - weakness rt hand  . Zenker's diverticulum     Tobacco History: Social History   Tobacco Use  Smoking Status Former Smoker  . Packs/day: 2.00  . Years: 40.00  . Pack years: 80.00  . Types: Cigarettes  . Start date: 44  . Quit date: 80  . Years since quitting: 32.2  Smokeless Tobacco Never Used   Counseling given: Yes   Continue to not smoke  Outpatient Encounter Medications as of 09/17/2019  Medication Sig  . amLODipine (NORVASC) 2.5 MG tablet Take 1 tablet (2.5 mg total) by mouth daily.  [name] aspirin EC 81 MG tablet Take 1 tablet (81 mg total) by mouth daily.  . cetirizine (ZYRTEC) 10 MG tablet Take 10 mg by mouth daily as needed for allergies.  [name] esomeprazole (NEXIUM) 40 MG capsule Take 40 mg by mouth daily.   . fluticasone (FLONASE) 50 MCG/ACT nasal spray Place 1 spray into both nostrils daily.  . furosemide (LASIX) 20 MG tablet Take 1  tablet (20 mg total) by mouth daily.  [name] HYDROcodone-acetaminophen (NORCO) 7.5-325 MG tablet Take 0.5-1 tablets by mouth every 6 (six) hours as needed for moderate pain. 12m-325mg  . imipramine (TOFRANIL) 25 MG tablet Take 75 mg by mouth at bedtime.   . insulin lispro (HUMALOG KWIKPEN) 100 UNIT/ML KwikPen Inject 5 Units into the skin 3 (three) times daily.   .Marland Kitchenlevothyroxine (SYNTHROID, LEVOTHROID) 100 MCG tablet Take 100 mcg by mouth daily before breakfast.  . LORazepam (ATIVAN) 0.5 MG tablet Take 0.5 mg by mouth 2 (two) times daily as needed.  . montelukast (SINGULAIR) 10 MG tablet Take 10 mg by mouth at bedtime.   . potassium chloride 20 MEQ TBCR Take 20 mEq by mouth daily.  . predniSONE (DELTASONE) 5 MG tablet Take 2 tablets daily until 09/24/19, then decrease to 1 tablet daily until next office visit in May 2021.  . triazolam (HALCION) 0.25 MG tablet Take 0.25 mg by mouth at bedtime.   .Marland Kitchenumeclidinium bromide (INCRUSE ELLIPTA) 62.5 MCG/INH AEPB Inhale 1 puff into the lungs daily.  . [DISCONTINUED] predniSONE (DELTASONE) 5 MG tablet Take 10 mg by mouth daily with breakfast.   . [DISCONTINUED] predniSONE (DELTASONE) 5 MG tablet Take 2 tablets (10 mg total) by mouth daily with breakfast. Take 2 tablets daily until 09/24/19, then decrease to 1 tablet daily until next office visit in May 2021.  . [DISCONTINUED] amoxicillin-clavulanate (AUGMENTIN) 875-125 MG tablet Take 1 tablet by mouth 2 (two) times daily.   No facility-administered encounter medications on file as of 09/17/2019.     Review of Systems  Review of Systems  Constitutional: Positive for fatigue (Improved). Negative for activity change and fever.  HENT: Negative for congestion, sinus pressure, sinus pain and sore throat.   Respiratory: Positive for shortness of breath (Improved). Negative for cough and wheezing.   Cardiovascular: Negative for chest pain and palpitations.  Gastrointestinal: Negative for diarrhea, nausea  and vomiting.   Musculoskeletal: Negative for arthralgias.  Neurological: Negative for dizziness.  Psychiatric/Behavioral: Negative for sleep disturbance. The patient is not nervous/anxious.      Physical Exam  BP 130/62   Pulse 78   Temp 98.4 F (36.9 C) (Temporal)   Ht _0  (1.575 m)   Wt 138 lb (62.6 kg)   SpO2 96% Comment: on RA  BMI 25.24 kg/m   Wt Readings from Last 5 Encounters:  09/17/19 138 lb (62.6 kg)  09/03/19 138 lb 1.6 oz (62.6 kg)  08/25/19 135 lb 6.4 oz (61.4 kg)  05/30/19 129 lb 12.8 oz (58.9 kg)  05/21/19 127 lb 12.8 oz (58 kg)    BMI Readings from Last 5 Encounters:  09/17/19 25.24 kg/m  09/03/19 25.26 kg/m  08/25/19 24.76 kg/m  05/30/19 23.74 kg/m  05/21/19 23.37 kg/m     Physical Exam Vitals and nursing note reviewed.  Constitutional:      General: She is not in acute distress.    Appearance: Normal appearance. She is normal weight.  HENT:     Head: Normocephalic and atraumatic.     Right Ear: Tympanic membrane, ear canal and external ear normal. There is no impacted cerumen.     Left Ear: Tympanic membrane, ear canal and external ear normal. There is no impacted cerumen.     Nose: Congestion (Right nare) present. No rhinorrhea.     Mouth/Throat:     Mouth: Mucous membranes are moist.     Pharynx: Oropharynx is clear.  Eyes:     Pupils: Pupils are equal, round, and reactive to light.  Cardiovascular:     Rate and Rhythm: Normal rate and regular rhythm.     Pulses: Normal pulses.     Heart sounds: Normal heart sounds. No murmur.  Pulmonary:     Effort: Pulmonary effort is normal. No respiratory distress.     Breath sounds: No decreased air movement. No decreased breath sounds, wheezing or rales.     Comments: Right upper lobe squeak Musculoskeletal:     Cervical back: Normal range of motion.  Skin:    General: Skin is warm and dry.     Capillary Refill: Capillary refill takes less than 2 seconds.  Neurological:     General: No focal deficit  present.     Mental Status: She is alert and oriented to person, place, and time. Mental status is at baseline.     Gait: Gait normal.  Psychiatric:        Mood and Affect: Mood normal.        Behavior: Behavior normal.        Thought Content: Thought content normal.        Judgment: Judgment normal.       Assessment & Plan:   COPD (chronic obstructive pulmonary disease) ([place]) Plan: Continue Incruse Ellipta Referral to pulmonary rehab Continue to increase physical exercise 16-monthfollow-up with Dr. SHalford Chessman BOOP (bronchiolitis obliterans with organizing pneumonia) [place] Plan: Finish 10 mg of daily prednisone over the next few days, then resume 5 mg prednisone daily dosing  Acute bronchitis Improved today  Scleroderma ([place] Continue prednisone as outlined on AVS, return to baseline 5 mg daily Refilled prednisone today Continue follow-up with rheumatology    Return in about 2 months (around 11/17/2019), or if symptoms worsen or fail to improve, for Follow up with Dr. SHalford Chessman   BLauraine Rinne NP 09/17/2019   This appointment required 23 minutes of patient care (  this includes precharting, chart review, review of results, face-to-face care, etc.).

## 2019-09-17 NOTE — Assessment & Plan Note (Signed)
Plan: Continue Incruse Ellipta Referral to pulmonary rehab Continue to increase physical exercise 61-month follow-up with Dr. Halford Chessman

## 2019-09-17 NOTE — Assessment & Plan Note (Signed)
Improved today.

## 2019-09-17 NOTE — Telephone Encounter (Signed)
See referral message sent to me today.

## 2019-09-18 ENCOUNTER — Other Ambulatory Visit: Payer: Self-pay

## 2019-09-18 DIAGNOSIS — M349 Systemic sclerosis, unspecified: Secondary | ICD-10-CM | POA: Diagnosis not present

## 2019-09-18 DIAGNOSIS — J8489 Other specified interstitial pulmonary diseases: Secondary | ICD-10-CM | POA: Diagnosis not present

## 2019-09-18 DIAGNOSIS — Z9861 Coronary angioplasty status: Secondary | ICD-10-CM | POA: Diagnosis not present

## 2019-09-18 DIAGNOSIS — Z950 Presence of cardiac pacemaker: Secondary | ICD-10-CM | POA: Diagnosis not present

## 2019-09-18 DIAGNOSIS — J438 Other emphysema: Secondary | ICD-10-CM

## 2019-09-18 DIAGNOSIS — I2510 Atherosclerotic heart disease of native coronary artery without angina pectoris: Secondary | ICD-10-CM | POA: Diagnosis not present

## 2019-09-18 DIAGNOSIS — J449 Chronic obstructive pulmonary disease, unspecified: Secondary | ICD-10-CM | POA: Diagnosis not present

## 2019-09-19 ENCOUNTER — Telehealth (HOSPITAL_COMMUNITY): Payer: Self-pay | Admitting: *Deleted

## 2019-09-19 NOTE — Telephone Encounter (Signed)
Received updated referral for this pt to participate in Pulmonary Rehab.  Called and spoke to pt who is well known to cardiac rehab staff from previous participation in phase II and maintenance. Asked pt interst level in participating in pulmonary rehab.  Pt cited that although she enjoyed coming to the program before things now are difficult for her.  Pt vision has worsen and she relies on [employer] for transportation to appts. Her daughter who has now moved in with her works nights sleeps during the daytime is unable to bring her for appt.  Pt also receives hospice care in the home.  "Nurse" comes out to the home once a week.  Unsure if medicare would continue to pay for in home service as well as outpatient synchronously.   Pt nurse is scheduled to come on next Thursday. [name] asked about physical therapy coming to the home would be preferred.  Would like to confer with the hospice nurse to better understand [name] level of care and services she may qualify for to increase her activity and stamina.  [name] unable to provide the hospice nurse contact information due to limited vision.  [name] will have her daughter, [name], when she wakes up this afternoon to give me a call with that information. [name] thanked me for the call and was glad to hear from me. Cherre Huger, BSN Cardiac and Training and development officer

## 2019-09-23 ENCOUNTER — Telehealth (HOSPITAL_COMMUNITY): Payer: Self-pay | Admitting: *Deleted

## 2019-09-23 NOTE — Telephone Encounter (Signed)
Returned call to [name], pt daughter and DPR. Left message detailing my request for the contact information for her mothers hospice nurse. Would like to contact the hospice nurse and  determine the level of care the hospice nurse provides and whether the home health agency she works with  could provide home PT. Cherre Huger, BSN Cardiac and Training and development officer

## 2019-09-25 DIAGNOSIS — Z8673 Personal history of transient ischemic attack (TIA), and cerebral infarction without residual deficits: Secondary | ICD-10-CM | POA: Diagnosis not present

## 2019-09-25 DIAGNOSIS — M349 Systemic sclerosis, unspecified: Secondary | ICD-10-CM | POA: Diagnosis not present

## 2019-09-25 DIAGNOSIS — M1990 Unspecified osteoarthritis, unspecified site: Secondary | ICD-10-CM | POA: Diagnosis not present

## 2019-09-25 DIAGNOSIS — Z9861 Coronary angioplasty status: Secondary | ICD-10-CM | POA: Diagnosis not present

## 2019-09-25 DIAGNOSIS — Z950 Presence of cardiac pacemaker: Secondary | ICD-10-CM | POA: Diagnosis not present

## 2019-09-25 DIAGNOSIS — H3530 Unspecified macular degeneration: Secondary | ICD-10-CM | POA: Diagnosis not present

## 2019-09-25 DIAGNOSIS — J449 Chronic obstructive pulmonary disease, unspecified: Secondary | ICD-10-CM | POA: Diagnosis not present

## 2019-09-25 DIAGNOSIS — Z794 Long term (current) use of insulin: Secondary | ICD-10-CM | POA: Diagnosis not present

## 2019-09-25 DIAGNOSIS — I2510 Atherosclerotic heart disease of native coronary artery without angina pectoris: Secondary | ICD-10-CM | POA: Diagnosis not present

## 2019-09-25 DIAGNOSIS — K225 Diverticulum of esophagus, acquired: Secondary | ICD-10-CM | POA: Diagnosis not present

## 2019-09-25 DIAGNOSIS — Z87891 Personal history of nicotine dependence: Secondary | ICD-10-CM | POA: Diagnosis not present

## 2019-09-25 DIAGNOSIS — K219 Gastro-esophageal reflux disease without esophagitis: Secondary | ICD-10-CM | POA: Diagnosis not present

## 2019-09-25 DIAGNOSIS — E119 Type 2 diabetes mellitus without complications: Secondary | ICD-10-CM | POA: Diagnosis not present

## 2019-09-25 DIAGNOSIS — J8489 Other specified interstitial pulmonary diseases: Secondary | ICD-10-CM | POA: Diagnosis not present

## 2019-09-30 ENCOUNTER — Telehealth: Payer: Self-pay | Admitting: Pulmonary Disease

## 2019-09-30 DIAGNOSIS — J449 Chronic obstructive pulmonary disease, unspecified: Secondary | ICD-10-CM

## 2019-09-30 NOTE — Telephone Encounter (Signed)
PT ordered and I spoke with the pt and let her know that we have ordered this, but it may not be approved. She verbalized understanding.

## 2019-09-30 NOTE — Telephone Encounter (Signed)
Spoke with Carlette at [facility]. She stated that she spoke with the patient recently about enrolling into the rehab program. The patient has stated that her vision has gotten worse since the last time she completed the program. She is also having issues with transportation and has to rely on public transportation.   The patient is interested in home physical therapy to work on her deconditioning. She is concerned about getting the PT approved because she has a hospice nurse who comes out to visit her once a week to make sure she is taking all of her medications and checking her vital signs. She is aware that once you receive hospice services, PT/OT services are normally declined by hospice. But she still wants to try PT at home if [facility] is ok with it.   I advised Carlette that I would send this message to both [place] and [facility]. She verbalized understanding.

## 2019-09-30 NOTE — Telephone Encounter (Signed)
I am okay with ordering home PT to see if this will be approved.  This may be denied.  We discussed this at the last office visit.  [name], FNP

## 2019-10-01 DIAGNOSIS — I2510 Atherosclerotic heart disease of native coronary artery without angina pectoris: Secondary | ICD-10-CM | POA: Diagnosis not present

## 2019-10-01 DIAGNOSIS — J8489 Other specified interstitial pulmonary diseases: Secondary | ICD-10-CM | POA: Diagnosis not present

## 2019-10-01 DIAGNOSIS — Z9861 Coronary angioplasty status: Secondary | ICD-10-CM | POA: Diagnosis not present

## 2019-10-01 DIAGNOSIS — J449 Chronic obstructive pulmonary disease, unspecified: Secondary | ICD-10-CM | POA: Diagnosis not present

## 2019-10-01 DIAGNOSIS — M349 Systemic sclerosis, unspecified: Secondary | ICD-10-CM | POA: Diagnosis not present

## 2019-10-01 DIAGNOSIS — Z950 Presence of cardiac pacemaker: Secondary | ICD-10-CM | POA: Diagnosis not present

## 2019-10-07 DIAGNOSIS — Z9861 Coronary angioplasty status: Secondary | ICD-10-CM | POA: Diagnosis not present

## 2019-10-07 DIAGNOSIS — M349 Systemic sclerosis, unspecified: Secondary | ICD-10-CM | POA: Diagnosis not present

## 2019-10-07 DIAGNOSIS — J449 Chronic obstructive pulmonary disease, unspecified: Secondary | ICD-10-CM | POA: Diagnosis not present

## 2019-10-07 DIAGNOSIS — J8489 Other specified interstitial pulmonary diseases: Secondary | ICD-10-CM | POA: Diagnosis not present

## 2019-10-07 DIAGNOSIS — I2510 Atherosclerotic heart disease of native coronary artery without angina pectoris: Secondary | ICD-10-CM | POA: Diagnosis not present

## 2019-10-07 DIAGNOSIS — Z950 Presence of cardiac pacemaker: Secondary | ICD-10-CM | POA: Diagnosis not present

## 2019-10-09 DIAGNOSIS — Z950 Presence of cardiac pacemaker: Secondary | ICD-10-CM | POA: Diagnosis not present

## 2019-10-09 DIAGNOSIS — J449 Chronic obstructive pulmonary disease, unspecified: Secondary | ICD-10-CM | POA: Diagnosis not present

## 2019-10-09 DIAGNOSIS — Z9861 Coronary angioplasty status: Secondary | ICD-10-CM | POA: Diagnosis not present

## 2019-10-09 DIAGNOSIS — I2510 Atherosclerotic heart disease of native coronary artery without angina pectoris: Secondary | ICD-10-CM | POA: Diagnosis not present

## 2019-10-09 DIAGNOSIS — J8489 Other specified interstitial pulmonary diseases: Secondary | ICD-10-CM | POA: Diagnosis not present

## 2019-10-09 DIAGNOSIS — M349 Systemic sclerosis, unspecified: Secondary | ICD-10-CM | POA: Diagnosis not present

## 2019-10-14 DIAGNOSIS — J449 Chronic obstructive pulmonary disease, unspecified: Secondary | ICD-10-CM | POA: Diagnosis not present

## 2019-10-14 DIAGNOSIS — Z9861 Coronary angioplasty status: Secondary | ICD-10-CM | POA: Diagnosis not present

## 2019-10-14 DIAGNOSIS — J8489 Other specified interstitial pulmonary diseases: Secondary | ICD-10-CM | POA: Diagnosis not present

## 2019-10-14 DIAGNOSIS — Z950 Presence of cardiac pacemaker: Secondary | ICD-10-CM | POA: Diagnosis not present

## 2019-10-14 DIAGNOSIS — I2510 Atherosclerotic heart disease of native coronary artery without angina pectoris: Secondary | ICD-10-CM | POA: Diagnosis not present

## 2019-10-14 DIAGNOSIS — M349 Systemic sclerosis, unspecified: Secondary | ICD-10-CM | POA: Diagnosis not present

## 2019-10-16 DIAGNOSIS — J449 Chronic obstructive pulmonary disease, unspecified: Secondary | ICD-10-CM | POA: Diagnosis not present

## 2019-10-16 DIAGNOSIS — I2510 Atherosclerotic heart disease of native coronary artery without angina pectoris: Secondary | ICD-10-CM | POA: Diagnosis not present

## 2019-10-16 DIAGNOSIS — J8489 Other specified interstitial pulmonary diseases: Secondary | ICD-10-CM | POA: Diagnosis not present

## 2019-10-16 DIAGNOSIS — Z950 Presence of cardiac pacemaker: Secondary | ICD-10-CM | POA: Diagnosis not present

## 2019-10-16 DIAGNOSIS — Z9861 Coronary angioplasty status: Secondary | ICD-10-CM | POA: Diagnosis not present

## 2019-10-16 DIAGNOSIS — M349 Systemic sclerosis, unspecified: Secondary | ICD-10-CM | POA: Diagnosis not present

## 2019-10-21 DIAGNOSIS — Z950 Presence of cardiac pacemaker: Secondary | ICD-10-CM | POA: Diagnosis not present

## 2019-10-21 DIAGNOSIS — I2510 Atherosclerotic heart disease of native coronary artery without angina pectoris: Secondary | ICD-10-CM | POA: Diagnosis not present

## 2019-10-21 DIAGNOSIS — J8489 Other specified interstitial pulmonary diseases: Secondary | ICD-10-CM | POA: Diagnosis not present

## 2019-10-21 DIAGNOSIS — M349 Systemic sclerosis, unspecified: Secondary | ICD-10-CM | POA: Diagnosis not present

## 2019-10-21 DIAGNOSIS — J449 Chronic obstructive pulmonary disease, unspecified: Secondary | ICD-10-CM | POA: Diagnosis not present

## 2019-10-21 DIAGNOSIS — Z9861 Coronary angioplasty status: Secondary | ICD-10-CM | POA: Diagnosis not present

## 2019-10-23 DIAGNOSIS — M349 Systemic sclerosis, unspecified: Secondary | ICD-10-CM | POA: Diagnosis not present

## 2019-10-23 DIAGNOSIS — I2510 Atherosclerotic heart disease of native coronary artery without angina pectoris: Secondary | ICD-10-CM | POA: Diagnosis not present

## 2019-10-23 DIAGNOSIS — J449 Chronic obstructive pulmonary disease, unspecified: Secondary | ICD-10-CM | POA: Diagnosis not present

## 2019-10-23 DIAGNOSIS — J8489 Other specified interstitial pulmonary diseases: Secondary | ICD-10-CM | POA: Diagnosis not present

## 2019-10-23 DIAGNOSIS — Z950 Presence of cardiac pacemaker: Secondary | ICD-10-CM | POA: Diagnosis not present

## 2019-10-23 DIAGNOSIS — Z9861 Coronary angioplasty status: Secondary | ICD-10-CM | POA: Diagnosis not present

## 2019-10-25 DIAGNOSIS — Z794 Long term (current) use of insulin: Secondary | ICD-10-CM | POA: Diagnosis not present

## 2019-10-25 DIAGNOSIS — M1990 Unspecified osteoarthritis, unspecified site: Secondary | ICD-10-CM | POA: Diagnosis not present

## 2019-10-25 DIAGNOSIS — Z9861 Coronary angioplasty status: Secondary | ICD-10-CM | POA: Diagnosis not present

## 2019-10-25 DIAGNOSIS — Z87891 Personal history of nicotine dependence: Secondary | ICD-10-CM | POA: Diagnosis not present

## 2019-10-25 DIAGNOSIS — H3530 Unspecified macular degeneration: Secondary | ICD-10-CM | POA: Diagnosis not present

## 2019-10-25 DIAGNOSIS — I2510 Atherosclerotic heart disease of native coronary artery without angina pectoris: Secondary | ICD-10-CM | POA: Diagnosis not present

## 2019-10-25 DIAGNOSIS — Z950 Presence of cardiac pacemaker: Secondary | ICD-10-CM | POA: Diagnosis not present

## 2019-10-25 DIAGNOSIS — K225 Diverticulum of esophagus, acquired: Secondary | ICD-10-CM | POA: Diagnosis not present

## 2019-10-25 DIAGNOSIS — E119 Type 2 diabetes mellitus without complications: Secondary | ICD-10-CM | POA: Diagnosis not present

## 2019-10-25 DIAGNOSIS — J309 Allergic rhinitis, unspecified: Secondary | ICD-10-CM | POA: Diagnosis not present

## 2019-10-25 DIAGNOSIS — J8489 Other specified interstitial pulmonary diseases: Secondary | ICD-10-CM | POA: Diagnosis not present

## 2019-10-25 DIAGNOSIS — M349 Systemic sclerosis, unspecified: Secondary | ICD-10-CM | POA: Diagnosis not present

## 2019-10-25 DIAGNOSIS — J449 Chronic obstructive pulmonary disease, unspecified: Secondary | ICD-10-CM | POA: Diagnosis not present

## 2019-10-25 DIAGNOSIS — K219 Gastro-esophageal reflux disease without esophagitis: Secondary | ICD-10-CM | POA: Diagnosis not present

## 2019-10-25 DIAGNOSIS — Z8673 Personal history of transient ischemic attack (TIA), and cerebral infarction without residual deficits: Secondary | ICD-10-CM | POA: Diagnosis not present

## 2019-10-28 DIAGNOSIS — M349 Systemic sclerosis, unspecified: Secondary | ICD-10-CM | POA: Diagnosis not present

## 2019-10-28 DIAGNOSIS — I2510 Atherosclerotic heart disease of native coronary artery without angina pectoris: Secondary | ICD-10-CM | POA: Diagnosis not present

## 2019-10-28 DIAGNOSIS — J8489 Other specified interstitial pulmonary diseases: Secondary | ICD-10-CM | POA: Diagnosis not present

## 2019-10-28 DIAGNOSIS — Z950 Presence of cardiac pacemaker: Secondary | ICD-10-CM | POA: Diagnosis not present

## 2019-10-28 DIAGNOSIS — J449 Chronic obstructive pulmonary disease, unspecified: Secondary | ICD-10-CM | POA: Diagnosis not present

## 2019-10-28 DIAGNOSIS — Z9861 Coronary angioplasty status: Secondary | ICD-10-CM | POA: Diagnosis not present

## 2019-10-30 DIAGNOSIS — J8489 Other specified interstitial pulmonary diseases: Secondary | ICD-10-CM | POA: Diagnosis not present

## 2019-10-30 DIAGNOSIS — Z950 Presence of cardiac pacemaker: Secondary | ICD-10-CM | POA: Diagnosis not present

## 2019-10-30 DIAGNOSIS — I2510 Atherosclerotic heart disease of native coronary artery without angina pectoris: Secondary | ICD-10-CM | POA: Diagnosis not present

## 2019-10-30 DIAGNOSIS — J449 Chronic obstructive pulmonary disease, unspecified: Secondary | ICD-10-CM | POA: Diagnosis not present

## 2019-10-30 DIAGNOSIS — Z9861 Coronary angioplasty status: Secondary | ICD-10-CM | POA: Diagnosis not present

## 2019-10-30 DIAGNOSIS — M349 Systemic sclerosis, unspecified: Secondary | ICD-10-CM | POA: Diagnosis not present

## 2019-10-31 DIAGNOSIS — Z950 Presence of cardiac pacemaker: Secondary | ICD-10-CM | POA: Diagnosis not present

## 2019-10-31 DIAGNOSIS — Z9861 Coronary angioplasty status: Secondary | ICD-10-CM | POA: Diagnosis not present

## 2019-10-31 DIAGNOSIS — J449 Chronic obstructive pulmonary disease, unspecified: Secondary | ICD-10-CM | POA: Diagnosis not present

## 2019-10-31 DIAGNOSIS — J8489 Other specified interstitial pulmonary diseases: Secondary | ICD-10-CM | POA: Diagnosis not present

## 2019-10-31 DIAGNOSIS — I2510 Atherosclerotic heart disease of native coronary artery without angina pectoris: Secondary | ICD-10-CM | POA: Diagnosis not present

## 2019-10-31 DIAGNOSIS — M349 Systemic sclerosis, unspecified: Secondary | ICD-10-CM | POA: Diagnosis not present

## 2019-11-05 DIAGNOSIS — J8489 Other specified interstitial pulmonary diseases: Secondary | ICD-10-CM | POA: Diagnosis not present

## 2019-11-05 DIAGNOSIS — J449 Chronic obstructive pulmonary disease, unspecified: Secondary | ICD-10-CM | POA: Diagnosis not present

## 2019-11-05 DIAGNOSIS — Z9861 Coronary angioplasty status: Secondary | ICD-10-CM | POA: Diagnosis not present

## 2019-11-05 DIAGNOSIS — M349 Systemic sclerosis, unspecified: Secondary | ICD-10-CM | POA: Diagnosis not present

## 2019-11-05 DIAGNOSIS — I2510 Atherosclerotic heart disease of native coronary artery without angina pectoris: Secondary | ICD-10-CM | POA: Diagnosis not present

## 2019-11-05 DIAGNOSIS — Z950 Presence of cardiac pacemaker: Secondary | ICD-10-CM | POA: Diagnosis not present

## 2019-11-06 DIAGNOSIS — J8489 Other specified interstitial pulmonary diseases: Secondary | ICD-10-CM | POA: Diagnosis not present

## 2019-11-06 DIAGNOSIS — Z950 Presence of cardiac pacemaker: Secondary | ICD-10-CM | POA: Diagnosis not present

## 2019-11-06 DIAGNOSIS — Z9861 Coronary angioplasty status: Secondary | ICD-10-CM | POA: Diagnosis not present

## 2019-11-06 DIAGNOSIS — J449 Chronic obstructive pulmonary disease, unspecified: Secondary | ICD-10-CM | POA: Diagnosis not present

## 2019-11-06 DIAGNOSIS — I2510 Atherosclerotic heart disease of native coronary artery without angina pectoris: Secondary | ICD-10-CM | POA: Diagnosis not present

## 2019-11-06 DIAGNOSIS — M349 Systemic sclerosis, unspecified: Secondary | ICD-10-CM | POA: Diagnosis not present

## 2019-11-07 ENCOUNTER — Other Ambulatory Visit: Payer: Self-pay | Admitting: Internal Medicine

## 2019-11-07 DIAGNOSIS — Z9861 Coronary angioplasty status: Secondary | ICD-10-CM | POA: Diagnosis not present

## 2019-11-07 DIAGNOSIS — Z950 Presence of cardiac pacemaker: Secondary | ICD-10-CM | POA: Diagnosis not present

## 2019-11-07 DIAGNOSIS — I2510 Atherosclerotic heart disease of native coronary artery without angina pectoris: Secondary | ICD-10-CM | POA: Diagnosis not present

## 2019-11-07 DIAGNOSIS — M349 Systemic sclerosis, unspecified: Secondary | ICD-10-CM | POA: Diagnosis not present

## 2019-11-07 DIAGNOSIS — J8489 Other specified interstitial pulmonary diseases: Secondary | ICD-10-CM | POA: Diagnosis not present

## 2019-11-07 DIAGNOSIS — J449 Chronic obstructive pulmonary disease, unspecified: Secondary | ICD-10-CM | POA: Diagnosis not present

## 2019-11-10 DIAGNOSIS — M349 Systemic sclerosis, unspecified: Secondary | ICD-10-CM | POA: Diagnosis not present

## 2019-11-10 DIAGNOSIS — Z9861 Coronary angioplasty status: Secondary | ICD-10-CM | POA: Diagnosis not present

## 2019-11-10 DIAGNOSIS — J449 Chronic obstructive pulmonary disease, unspecified: Secondary | ICD-10-CM | POA: Diagnosis not present

## 2019-11-10 DIAGNOSIS — Z950 Presence of cardiac pacemaker: Secondary | ICD-10-CM | POA: Diagnosis not present

## 2019-11-10 DIAGNOSIS — J8489 Other specified interstitial pulmonary diseases: Secondary | ICD-10-CM | POA: Diagnosis not present

## 2019-11-10 DIAGNOSIS — I2510 Atherosclerotic heart disease of native coronary artery without angina pectoris: Secondary | ICD-10-CM | POA: Diagnosis not present

## 2019-11-13 DIAGNOSIS — J449 Chronic obstructive pulmonary disease, unspecified: Secondary | ICD-10-CM | POA: Diagnosis not present

## 2019-11-13 DIAGNOSIS — Z950 Presence of cardiac pacemaker: Secondary | ICD-10-CM | POA: Diagnosis not present

## 2019-11-13 DIAGNOSIS — J8489 Other specified interstitial pulmonary diseases: Secondary | ICD-10-CM | POA: Diagnosis not present

## 2019-11-13 DIAGNOSIS — M349 Systemic sclerosis, unspecified: Secondary | ICD-10-CM | POA: Diagnosis not present

## 2019-11-13 DIAGNOSIS — Z9861 Coronary angioplasty status: Secondary | ICD-10-CM | POA: Diagnosis not present

## 2019-11-13 DIAGNOSIS — I2510 Atherosclerotic heart disease of native coronary artery without angina pectoris: Secondary | ICD-10-CM | POA: Diagnosis not present

## 2019-11-14 ENCOUNTER — Ambulatory Visit (INDEPENDENT_AMBULATORY_CARE_PROVIDER_SITE_OTHER): Admitting: Pulmonary Disease

## 2019-11-14 ENCOUNTER — Other Ambulatory Visit: Payer: Self-pay

## 2019-11-14 ENCOUNTER — Encounter: Payer: Self-pay | Admitting: Pulmonary Disease

## 2019-11-14 VITALS — BP 110/58 | HR 77 | Temp 98.1°F | Ht 62.0 in | Wt 134.4 lb

## 2019-11-14 DIAGNOSIS — J849 Interstitial pulmonary disease, unspecified: Secondary | ICD-10-CM

## 2019-11-14 DIAGNOSIS — Z950 Presence of cardiac pacemaker: Secondary | ICD-10-CM | POA: Diagnosis not present

## 2019-11-14 DIAGNOSIS — J449 Chronic obstructive pulmonary disease, unspecified: Secondary | ICD-10-CM | POA: Diagnosis not present

## 2019-11-14 DIAGNOSIS — J679 Hypersensitivity pneumonitis due to unspecified organic dust: Secondary | ICD-10-CM

## 2019-11-14 DIAGNOSIS — I2510 Atherosclerotic heart disease of native coronary artery without angina pectoris: Secondary | ICD-10-CM | POA: Diagnosis not present

## 2019-11-14 DIAGNOSIS — M349 Systemic sclerosis, unspecified: Secondary | ICD-10-CM | POA: Diagnosis not present

## 2019-11-14 DIAGNOSIS — J438 Other emphysema: Secondary | ICD-10-CM | POA: Diagnosis not present

## 2019-11-14 DIAGNOSIS — Z9861 Coronary angioplasty status: Secondary | ICD-10-CM | POA: Diagnosis not present

## 2019-11-14 DIAGNOSIS — J8489 Other specified interstitial pulmonary diseases: Secondary | ICD-10-CM | POA: Diagnosis not present

## 2019-11-14 NOTE — Patient Instructions (Signed)
Follow up in 6 months 

## 2019-11-14 NOTE — Progress Notes (Signed)
[facility]  Chief Complaint  Patient presents with  . Follow-up    Dyspena and COPD, SOB doing better, gets fatigued before SOB. no cough    Constitutional:  BP (!) 110/58 (BP Location: Left Arm, Patient Position: Sitting, Cuff Size: Normal)   Pulse 77   Temp 98.1 F (36.7 C) (Temporal)   Ht '5\' 2"'  (1.575 m)   Wt 134 lb 6.4 oz (61 kg)   BMI 24.58 kg/m   Past Medical History:  Zenker's diverticulum, CVA, Sjogren's disease, Scleroderma, Raynaud's phenomenon, PNA, Macular degeneration, CAD, IBS, Hypothyroidism, HTN, GERD, HLD, Dressler's syndrome, Diverticulosis, DM, Depression, Recurrent sinusitis, Cervical cancer  Brief Summary:  [name] is a 86 y.o. 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  Subjective:   Here with her daughter.  Enrolled in hospice in [facility].  Might graduate out of this.  Breathing better recently.  On 10 mg prednisone daily, but increased to see if helps control her joint pains.    Not having chest pain, cough, fever, or sputum.  Physical Exam:   Appearance - well kempt   ENMT - no sinus tenderness, no oral exudate, no LAN, Mallampati 2 airway, no stridor  Respiratory - equal breath sounds bilaterally, no wheezing or rales  CV - s1s2 regular rate and rhythm, no murmurs  Ext - no clubbing, no edema, wrap on Rt forearm  Skin - no rashes  Psych - normal mood and affect   Discussion:  She has clinical improvement with higher dose prednisone.  Lab findings suggestive of scleroderma being cause of pneumonitis and ILD.  She is also concerned about mold exposure and could have hypersensitivity pneumonitis.  Assessment/Plan:   ILD in setting of scleroderma and possible mold exposure. - improved  - taper back to prednisone 5 mg daily once her pain symptoms are controlled  continue prednisone 40 mg daily for  next two weeks, then change to 30 mg daily - defer bronchoscopy for now - radiographic changes were not evident on 2 view CXR; will need to do CT chest imaging for f/u  Recurrent sinusitis with upper airway cough. - zyrtec, flonase, singulair  Hx of tobacco abuse with emphysema. - incruse with prn albuterol  Coronary artery disease s/p stenting. - f/u with cardiology  Dementia. - enrolled in hospice, but might graduate from this  A total of  23 minutes spent addressing patient care issues on day of visit.  Follow up:   Patient Instructions  Follow up in 6 months   Signature:  Chesley Mires, MD [place] Pager: [phone] 11/14/2019, 12:09 PM  Flow Sheet     Pulmonary tests:   August 2010 >> Bronchoscopy  PFT 06/16/09 >> FEV1 1.99(118%), FEV1% 73, TLC 4.60(105%), DLCO 66%, no BD  Serology 05/16/19 >> ANA positive, 1:640 centromere Ab, HP negative, Scl 70 < 1, RF < 14, ESR 54  Chest imaging:   CT chest 11/24/11 >> centrilobular emphysema, RUL ASD  CT sinus 07/30/13 >> opacification of Lt maxillary sinus, rightward NSD  CT sinus 10/05/15 >> b/l mucosal edema paranasal sinuses, obstruction of ostium Rt maxillary sinus  CT angio chest 07/09/17 >> GGO RUL  HRCT 05/14/19 >> atherosclerosis, mild emphysema, 1.2 cm RUL opacity, patchy confluent GGO b/l with centrilobular micronodular character, moderate patchy air trapping  Cardiac tests:   Echo 12/28/18 >> EF 60 to 65%, mild LVH, normal RV  systolic fx  LHC 5/68/61 >> severe 3 vessel CAD  Medications:   Allergies as of 11/14/2019      Reactions   Flomax [tamsulosin Hcl] Other (See Comments)   Made her 'trachea feel tight and couldn't swallow'   Peanut Oil Anaphylaxis, Swelling, Rash, Other (See Comments)   Throat tightens, also   Sulfa Antibiotics Anaphylaxis      Titanium Other (See Comments)   Neck wouldn't heal with titanium cervical plate   Yellow Dye Other (See Comments)   "Bee  stings" all over body when eating/taking medications with red or yellow dye    Doxycycline Other (See Comments)   Per pt, caused oral thrush and loss of appetite   Lantus [insulin Glargine] Other (See Comments)   Patient states "sulfate binder causes sinus infection/pneumonia".   Levaquin [levofloxacin In D5w] Other (See Comments)   Muscle aches   Metoprolol-hydrochlorothiazide Other (See Comments)   Decreased B/P   Norvasc [amlodipine] Swelling, Other (See Comments)   BIL Ankle edema   Novolog [insulin Aspart]    Patient states "sulfate binder causes sinus infection/pneumonia". Tolerates Humalog.   Penciclovir Other (See Comments)   Muscle Pain   Red Dye Other (See Comments)   Burning sensation   Senna [sennosides] Other (See Comments)   Pt states that it causes a "stinging" sensation   Statins Other (See Comments)   All of them make me "feel badly"   Sulfonamide Derivatives Hives   Adhesive [tape] Hives, Rash   Maxidex [dexamethasone] Anxiety, Other (See Comments)   Insomnia and dizziness, too      Medication List       Accurate as of Nov 14, 2019 12:09 PM. If you have any questions, ask your nurse or doctor.        amLODipine 2.5 MG tablet Commonly known as: NORVASC TAKE 1 TABLET ONCE DAILY.   aspirin EC 81 MG tablet Take 1 tablet (81 mg total) by mouth daily.   Augmentin 500-125 MG tablet Generic drug: amoxicillin-clavulanate   cetirizine 10 MG tablet Commonly known as: ZYRTEC Take 10 mg by mouth daily as needed for allergies.   Dulcolax 10 MG suppository Generic drug: bisacodyl Insert one suppository rectally for no bowel movement in 3 days   esomeprazole 40 MG capsule Commonly known as: NEXIUM Take 40 mg by mouth daily.   famotidine 20 MG tablet Commonly known as: PEPCID Take 20 mg by mouth at bedtime.   fentaNYL 25 MCG/HR Commonly known as: DURAGESIC 1 patch every 3 (three) days.   fluticasone 50 MCG/ACT nasal spray Commonly known as: FLONASE  Place 1 spray into both nostrils daily.   furosemide 20 MG tablet Commonly known as: LASIX TAKE 1 TABLET ONCE DAILY.   gabapentin 100 MG capsule Commonly known as: NEURONTIN   GNP Magnesium Citrate Soln Generic drug: magnesium citrate SMARTSIG:0.5 Bottle(s) By Mouth Daily   HumaLOG KwikPen 100 UNIT/ML KwikPen Generic drug: insulin lispro Inject 5 Units into the skin 3 (three) times daily.   HYDROcodone-acetaminophen 7.5-325 MG tablet Commonly known as: NORCO Take 0.5-1 tablets by mouth every 6 (six) hours as needed for moderate pain. 12m-325mg   imipramine 25 MG tablet Commonly known as: TOFRANIL Take 75 mg by mouth at bedtime.   Incruse Ellipta 62.5 MCG/INH Aepb Generic drug: umeclidinium bromide Inhale 1 puff into the lungs daily.   levothyroxine 100 MCG tablet Commonly known as: SYNTHROID Take 100 mcg by mouth daily before breakfast.   LORazepam 0.5 MG tablet Commonly known as:  ATIVAN Take 0.5 mg by mouth 2 (two) times daily as needed.   Milk of Magnesia Concentrate 2400 MG/10ML Susp Generic drug: Magnesium Hydroxide Take 10 ml by mouth three times a day as directed For 2 days if no BM in 3 days   montelukast 10 MG tablet Commonly known as: SINGULAIR Take 10 mg by mouth at bedtime.   ondansetron 8 MG tablet Commonly known as: ZOFRAN Take 8 mg by mouth every 8 (eight) hours as needed.   Potassium Chloride ER 20 MEQ Tbcr Take 20 mEq by mouth daily.   predniSONE 5 MG tablet Commonly known as: DELTASONE Take 2 tablets daily until 09/24/19, then decrease to 1 tablet daily until next office visit in May 2021.   Proventil HFA 108 (90 Base) MCG/ACT inhaler Generic drug: albuterol SMARTSIG:1 Puff(s) Via Inhaler 4 Times Daily PRN   triazolam 0.25 MG tablet Commonly known as: HALCION Take 0.25 mg by mouth at bedtime.       Past Surgical History:  She  has a past surgical history that includes Total abdominal hysterectomy (1973); Bilateral oophorectomy  (2002); Spinal fusion (1997); lobe thyroid removal; Thyroid lobectomy (1991); Cholecystectomy (1965); Appendectomy; Cataract extraction; Bronchoscopy (02-22-09); STENTED CARDIAC  ARTERY (2007 / 2007 / 2010); Proctoscopy (03/18/2012); Colon surgery (2014); Esophagogastroduodenoscopy (egd) with esophageal dilation (N/A, 06/18/2013); Cardiac catheterization; colonectomy; Radiology with anesthesia (N/A, 03/09/2015); Flexible sigmoidoscopy (N/A, 07/15/2015); Balloon dilation (N/A, 07/15/2015); LEFT HEART CATH AND CORONARY ANGIOGRAPHY (N/A, 12/30/2018); and CORONARY STENT INTERVENTION (N/A, 12/30/2018).  Family History:  Her family history includes Cancer in her brother and sister; Diabetes in her brother, brother, father, and sister; Heart disease in her father; Other (age of onset: 63) in her mother; Stroke (age of onset: 39) in her father.  Social History:  She  reports that she quit smoking about 32 years ago. Her smoking use included cigarettes. She started smoking about 72 years ago. She has a 80.00 pack-year smoking history. She has never used smokeless tobacco. She reports that she does not drink alcohol or use drugs.

## 2019-11-20 DIAGNOSIS — Z950 Presence of cardiac pacemaker: Secondary | ICD-10-CM | POA: Diagnosis not present

## 2019-11-20 DIAGNOSIS — J449 Chronic obstructive pulmonary disease, unspecified: Secondary | ICD-10-CM | POA: Diagnosis not present

## 2019-11-20 DIAGNOSIS — I2510 Atherosclerotic heart disease of native coronary artery without angina pectoris: Secondary | ICD-10-CM | POA: Diagnosis not present

## 2019-11-20 DIAGNOSIS — M349 Systemic sclerosis, unspecified: Secondary | ICD-10-CM | POA: Diagnosis not present

## 2019-11-20 DIAGNOSIS — Z9861 Coronary angioplasty status: Secondary | ICD-10-CM | POA: Diagnosis not present

## 2019-11-20 DIAGNOSIS — J8489 Other specified interstitial pulmonary diseases: Secondary | ICD-10-CM | POA: Diagnosis not present

## 2019-11-21 DIAGNOSIS — Z950 Presence of cardiac pacemaker: Secondary | ICD-10-CM | POA: Diagnosis not present

## 2019-11-21 DIAGNOSIS — J449 Chronic obstructive pulmonary disease, unspecified: Secondary | ICD-10-CM | POA: Diagnosis not present

## 2019-11-21 DIAGNOSIS — M349 Systemic sclerosis, unspecified: Secondary | ICD-10-CM | POA: Diagnosis not present

## 2019-11-21 DIAGNOSIS — J8489 Other specified interstitial pulmonary diseases: Secondary | ICD-10-CM | POA: Diagnosis not present

## 2019-11-21 DIAGNOSIS — Z9861 Coronary angioplasty status: Secondary | ICD-10-CM | POA: Diagnosis not present

## 2019-11-21 DIAGNOSIS — I2510 Atherosclerotic heart disease of native coronary artery without angina pectoris: Secondary | ICD-10-CM | POA: Diagnosis not present

## 2019-12-19 ENCOUNTER — Other Ambulatory Visit: Payer: Self-pay | Admitting: Internal Medicine

## 2019-12-23 ENCOUNTER — Other Ambulatory Visit: Payer: Self-pay | Admitting: Internal Medicine

## 2019-12-25 DIAGNOSIS — M349 Systemic sclerosis, unspecified: Secondary | ICD-10-CM | POA: Diagnosis not present

## 2019-12-25 DIAGNOSIS — K219 Gastro-esophageal reflux disease without esophagitis: Secondary | ICD-10-CM | POA: Diagnosis not present

## 2019-12-25 DIAGNOSIS — Z794 Long term (current) use of insulin: Secondary | ICD-10-CM | POA: Diagnosis not present

## 2019-12-25 DIAGNOSIS — J309 Allergic rhinitis, unspecified: Secondary | ICD-10-CM | POA: Diagnosis not present

## 2019-12-25 DIAGNOSIS — Z87891 Personal history of nicotine dependence: Secondary | ICD-10-CM | POA: Diagnosis not present

## 2019-12-25 DIAGNOSIS — M1990 Unspecified osteoarthritis, unspecified site: Secondary | ICD-10-CM | POA: Diagnosis not present

## 2019-12-25 DIAGNOSIS — K225 Diverticulum of esophagus, acquired: Secondary | ICD-10-CM | POA: Diagnosis not present

## 2019-12-25 DIAGNOSIS — Z9861 Coronary angioplasty status: Secondary | ICD-10-CM | POA: Diagnosis not present

## 2019-12-25 DIAGNOSIS — J8489 Other specified interstitial pulmonary diseases: Secondary | ICD-10-CM | POA: Diagnosis not present

## 2019-12-25 DIAGNOSIS — Z8673 Personal history of transient ischemic attack (TIA), and cerebral infarction without residual deficits: Secondary | ICD-10-CM | POA: Diagnosis not present

## 2019-12-25 DIAGNOSIS — I2510 Atherosclerotic heart disease of native coronary artery without angina pectoris: Secondary | ICD-10-CM | POA: Diagnosis not present

## 2019-12-25 DIAGNOSIS — J449 Chronic obstructive pulmonary disease, unspecified: Secondary | ICD-10-CM | POA: Diagnosis not present

## 2019-12-25 DIAGNOSIS — E119 Type 2 diabetes mellitus without complications: Secondary | ICD-10-CM | POA: Diagnosis not present

## 2019-12-25 DIAGNOSIS — H3530 Unspecified macular degeneration: Secondary | ICD-10-CM | POA: Diagnosis not present

## 2019-12-25 DIAGNOSIS — Z950 Presence of cardiac pacemaker: Secondary | ICD-10-CM | POA: Diagnosis not present

## 2019-12-26 DIAGNOSIS — J8489 Other specified interstitial pulmonary diseases: Secondary | ICD-10-CM | POA: Diagnosis not present

## 2019-12-26 DIAGNOSIS — Z950 Presence of cardiac pacemaker: Secondary | ICD-10-CM | POA: Diagnosis not present

## 2019-12-26 DIAGNOSIS — I2510 Atherosclerotic heart disease of native coronary artery without angina pectoris: Secondary | ICD-10-CM | POA: Diagnosis not present

## 2019-12-26 DIAGNOSIS — M349 Systemic sclerosis, unspecified: Secondary | ICD-10-CM | POA: Diagnosis not present

## 2019-12-26 DIAGNOSIS — J449 Chronic obstructive pulmonary disease, unspecified: Secondary | ICD-10-CM | POA: Diagnosis not present

## 2019-12-26 DIAGNOSIS — Z9861 Coronary angioplasty status: Secondary | ICD-10-CM | POA: Diagnosis not present

## 2019-12-31 DIAGNOSIS — I2510 Atherosclerotic heart disease of native coronary artery without angina pectoris: Secondary | ICD-10-CM | POA: Diagnosis not present

## 2019-12-31 DIAGNOSIS — Z950 Presence of cardiac pacemaker: Secondary | ICD-10-CM | POA: Diagnosis not present

## 2019-12-31 DIAGNOSIS — M349 Systemic sclerosis, unspecified: Secondary | ICD-10-CM | POA: Diagnosis not present

## 2019-12-31 DIAGNOSIS — J449 Chronic obstructive pulmonary disease, unspecified: Secondary | ICD-10-CM | POA: Diagnosis not present

## 2019-12-31 DIAGNOSIS — J8489 Other specified interstitial pulmonary diseases: Secondary | ICD-10-CM | POA: Diagnosis not present

## 2019-12-31 DIAGNOSIS — Z9861 Coronary angioplasty status: Secondary | ICD-10-CM | POA: Diagnosis not present

## 2020-01-01 DIAGNOSIS — M349 Systemic sclerosis, unspecified: Secondary | ICD-10-CM | POA: Diagnosis not present

## 2020-01-01 DIAGNOSIS — J8489 Other specified interstitial pulmonary diseases: Secondary | ICD-10-CM | POA: Diagnosis not present

## 2020-01-01 DIAGNOSIS — Z950 Presence of cardiac pacemaker: Secondary | ICD-10-CM | POA: Diagnosis not present

## 2020-01-01 DIAGNOSIS — J449 Chronic obstructive pulmonary disease, unspecified: Secondary | ICD-10-CM | POA: Diagnosis not present

## 2020-01-01 DIAGNOSIS — Z9861 Coronary angioplasty status: Secondary | ICD-10-CM | POA: Diagnosis not present

## 2020-01-01 DIAGNOSIS — I2510 Atherosclerotic heart disease of native coronary artery without angina pectoris: Secondary | ICD-10-CM | POA: Diagnosis not present

## 2020-01-02 DIAGNOSIS — J8489 Other specified interstitial pulmonary diseases: Secondary | ICD-10-CM | POA: Diagnosis not present

## 2020-01-02 DIAGNOSIS — I2510 Atherosclerotic heart disease of native coronary artery without angina pectoris: Secondary | ICD-10-CM | POA: Diagnosis not present

## 2020-01-02 DIAGNOSIS — J449 Chronic obstructive pulmonary disease, unspecified: Secondary | ICD-10-CM | POA: Diagnosis not present

## 2020-01-02 DIAGNOSIS — Z950 Presence of cardiac pacemaker: Secondary | ICD-10-CM | POA: Diagnosis not present

## 2020-01-02 DIAGNOSIS — Z9861 Coronary angioplasty status: Secondary | ICD-10-CM | POA: Diagnosis not present

## 2020-01-02 DIAGNOSIS — M349 Systemic sclerosis, unspecified: Secondary | ICD-10-CM | POA: Diagnosis not present

## 2020-01-06 DIAGNOSIS — Z9861 Coronary angioplasty status: Secondary | ICD-10-CM | POA: Diagnosis not present

## 2020-01-06 DIAGNOSIS — J449 Chronic obstructive pulmonary disease, unspecified: Secondary | ICD-10-CM | POA: Diagnosis not present

## 2020-01-06 DIAGNOSIS — J8489 Other specified interstitial pulmonary diseases: Secondary | ICD-10-CM | POA: Diagnosis not present

## 2020-01-06 DIAGNOSIS — M349 Systemic sclerosis, unspecified: Secondary | ICD-10-CM | POA: Diagnosis not present

## 2020-01-06 DIAGNOSIS — Z950 Presence of cardiac pacemaker: Secondary | ICD-10-CM | POA: Diagnosis not present

## 2020-01-06 DIAGNOSIS — I2510 Atherosclerotic heart disease of native coronary artery without angina pectoris: Secondary | ICD-10-CM | POA: Diagnosis not present

## 2020-01-08 DIAGNOSIS — Z9861 Coronary angioplasty status: Secondary | ICD-10-CM | POA: Diagnosis not present

## 2020-01-08 DIAGNOSIS — J449 Chronic obstructive pulmonary disease, unspecified: Secondary | ICD-10-CM | POA: Diagnosis not present

## 2020-01-08 DIAGNOSIS — Z950 Presence of cardiac pacemaker: Secondary | ICD-10-CM | POA: Diagnosis not present

## 2020-01-08 DIAGNOSIS — I2510 Atherosclerotic heart disease of native coronary artery without angina pectoris: Secondary | ICD-10-CM | POA: Diagnosis not present

## 2020-01-08 DIAGNOSIS — J8489 Other specified interstitial pulmonary diseases: Secondary | ICD-10-CM | POA: Diagnosis not present

## 2020-01-08 DIAGNOSIS — M349 Systemic sclerosis, unspecified: Secondary | ICD-10-CM | POA: Diagnosis not present

## 2020-01-14 DIAGNOSIS — J8489 Other specified interstitial pulmonary diseases: Secondary | ICD-10-CM | POA: Diagnosis not present

## 2020-01-14 DIAGNOSIS — J449 Chronic obstructive pulmonary disease, unspecified: Secondary | ICD-10-CM | POA: Diagnosis not present

## 2020-01-14 DIAGNOSIS — M349 Systemic sclerosis, unspecified: Secondary | ICD-10-CM | POA: Diagnosis not present

## 2020-01-14 DIAGNOSIS — Z950 Presence of cardiac pacemaker: Secondary | ICD-10-CM | POA: Diagnosis not present

## 2020-01-14 DIAGNOSIS — I2510 Atherosclerotic heart disease of native coronary artery without angina pectoris: Secondary | ICD-10-CM | POA: Diagnosis not present

## 2020-01-14 DIAGNOSIS — Z9861 Coronary angioplasty status: Secondary | ICD-10-CM | POA: Diagnosis not present

## 2020-01-15 DIAGNOSIS — Z9861 Coronary angioplasty status: Secondary | ICD-10-CM | POA: Diagnosis not present

## 2020-01-15 DIAGNOSIS — J8489 Other specified interstitial pulmonary diseases: Secondary | ICD-10-CM | POA: Diagnosis not present

## 2020-01-15 DIAGNOSIS — I2510 Atherosclerotic heart disease of native coronary artery without angina pectoris: Secondary | ICD-10-CM | POA: Diagnosis not present

## 2020-01-15 DIAGNOSIS — J449 Chronic obstructive pulmonary disease, unspecified: Secondary | ICD-10-CM | POA: Diagnosis not present

## 2020-01-15 DIAGNOSIS — Z950 Presence of cardiac pacemaker: Secondary | ICD-10-CM | POA: Diagnosis not present

## 2020-01-15 DIAGNOSIS — M349 Systemic sclerosis, unspecified: Secondary | ICD-10-CM | POA: Diagnosis not present

## 2020-01-16 DIAGNOSIS — Z9861 Coronary angioplasty status: Secondary | ICD-10-CM | POA: Diagnosis not present

## 2020-01-16 DIAGNOSIS — Z950 Presence of cardiac pacemaker: Secondary | ICD-10-CM | POA: Diagnosis not present

## 2020-01-16 DIAGNOSIS — J8489 Other specified interstitial pulmonary diseases: Secondary | ICD-10-CM | POA: Diagnosis not present

## 2020-01-16 DIAGNOSIS — I2510 Atherosclerotic heart disease of native coronary artery without angina pectoris: Secondary | ICD-10-CM | POA: Diagnosis not present

## 2020-01-16 DIAGNOSIS — M349 Systemic sclerosis, unspecified: Secondary | ICD-10-CM | POA: Diagnosis not present

## 2020-01-16 DIAGNOSIS — J449 Chronic obstructive pulmonary disease, unspecified: Secondary | ICD-10-CM | POA: Diagnosis not present

## 2020-01-17 DIAGNOSIS — J8489 Other specified interstitial pulmonary diseases: Secondary | ICD-10-CM | POA: Diagnosis not present

## 2020-01-17 DIAGNOSIS — Z950 Presence of cardiac pacemaker: Secondary | ICD-10-CM | POA: Diagnosis not present

## 2020-01-17 DIAGNOSIS — Z9861 Coronary angioplasty status: Secondary | ICD-10-CM | POA: Diagnosis not present

## 2020-01-17 DIAGNOSIS — J449 Chronic obstructive pulmonary disease, unspecified: Secondary | ICD-10-CM | POA: Diagnosis not present

## 2020-01-17 DIAGNOSIS — M349 Systemic sclerosis, unspecified: Secondary | ICD-10-CM | POA: Diagnosis not present

## 2020-01-17 DIAGNOSIS — I2510 Atherosclerotic heart disease of native coronary artery without angina pectoris: Secondary | ICD-10-CM | POA: Diagnosis not present

## 2020-01-18 DIAGNOSIS — J8489 Other specified interstitial pulmonary diseases: Secondary | ICD-10-CM | POA: Diagnosis not present

## 2020-01-18 DIAGNOSIS — I2510 Atherosclerotic heart disease of native coronary artery without angina pectoris: Secondary | ICD-10-CM | POA: Diagnosis not present

## 2020-01-18 DIAGNOSIS — Z950 Presence of cardiac pacemaker: Secondary | ICD-10-CM | POA: Diagnosis not present

## 2020-01-18 DIAGNOSIS — J449 Chronic obstructive pulmonary disease, unspecified: Secondary | ICD-10-CM | POA: Diagnosis not present

## 2020-01-18 DIAGNOSIS — Z9861 Coronary angioplasty status: Secondary | ICD-10-CM | POA: Diagnosis not present

## 2020-01-18 DIAGNOSIS — M349 Systemic sclerosis, unspecified: Secondary | ICD-10-CM | POA: Diagnosis not present

## 2020-01-19 DIAGNOSIS — M349 Systemic sclerosis, unspecified: Secondary | ICD-10-CM | POA: Diagnosis not present

## 2020-01-19 DIAGNOSIS — J449 Chronic obstructive pulmonary disease, unspecified: Secondary | ICD-10-CM | POA: Diagnosis not present

## 2020-01-19 DIAGNOSIS — Z950 Presence of cardiac pacemaker: Secondary | ICD-10-CM | POA: Diagnosis not present

## 2020-01-19 DIAGNOSIS — Z9861 Coronary angioplasty status: Secondary | ICD-10-CM | POA: Diagnosis not present

## 2020-01-19 DIAGNOSIS — J8489 Other specified interstitial pulmonary diseases: Secondary | ICD-10-CM | POA: Diagnosis not present

## 2020-01-19 DIAGNOSIS — I2510 Atherosclerotic heart disease of native coronary artery without angina pectoris: Secondary | ICD-10-CM | POA: Diagnosis not present

## 2020-01-20 DIAGNOSIS — Z9861 Coronary angioplasty status: Secondary | ICD-10-CM | POA: Diagnosis not present

## 2020-01-20 DIAGNOSIS — J449 Chronic obstructive pulmonary disease, unspecified: Secondary | ICD-10-CM | POA: Diagnosis not present

## 2020-01-20 DIAGNOSIS — I2510 Atherosclerotic heart disease of native coronary artery without angina pectoris: Secondary | ICD-10-CM | POA: Diagnosis not present

## 2020-01-20 DIAGNOSIS — M349 Systemic sclerosis, unspecified: Secondary | ICD-10-CM | POA: Diagnosis not present

## 2020-01-20 DIAGNOSIS — J8489 Other specified interstitial pulmonary diseases: Secondary | ICD-10-CM | POA: Diagnosis not present

## 2020-01-20 DIAGNOSIS — Z950 Presence of cardiac pacemaker: Secondary | ICD-10-CM | POA: Diagnosis not present

## 2020-01-21 DIAGNOSIS — Z9861 Coronary angioplasty status: Secondary | ICD-10-CM | POA: Diagnosis not present

## 2020-01-21 DIAGNOSIS — I2510 Atherosclerotic heart disease of native coronary artery without angina pectoris: Secondary | ICD-10-CM | POA: Diagnosis not present

## 2020-01-21 DIAGNOSIS — J449 Chronic obstructive pulmonary disease, unspecified: Secondary | ICD-10-CM | POA: Diagnosis not present

## 2020-01-21 DIAGNOSIS — Z950 Presence of cardiac pacemaker: Secondary | ICD-10-CM | POA: Diagnosis not present

## 2020-01-21 DIAGNOSIS — J8489 Other specified interstitial pulmonary diseases: Secondary | ICD-10-CM | POA: Diagnosis not present

## 2020-01-21 DIAGNOSIS — M349 Systemic sclerosis, unspecified: Secondary | ICD-10-CM | POA: Diagnosis not present

## 2020-01-24 DIAGNOSIS — I2510 Atherosclerotic heart disease of native coronary artery without angina pectoris: Secondary | ICD-10-CM | POA: Diagnosis not present

## 2020-01-24 DIAGNOSIS — J449 Chronic obstructive pulmonary disease, unspecified: Secondary | ICD-10-CM | POA: Diagnosis not present

## 2020-01-24 DIAGNOSIS — Z9861 Coronary angioplasty status: Secondary | ICD-10-CM | POA: Diagnosis not present

## 2020-01-24 DIAGNOSIS — J8489 Other specified interstitial pulmonary diseases: Secondary | ICD-10-CM | POA: Diagnosis not present

## 2020-01-24 DIAGNOSIS — M349 Systemic sclerosis, unspecified: Secondary | ICD-10-CM | POA: Diagnosis not present

## 2020-01-24 DIAGNOSIS — Z950 Presence of cardiac pacemaker: Secondary | ICD-10-CM | POA: Diagnosis not present

## 2020-01-25 DIAGNOSIS — K219 Gastro-esophageal reflux disease without esophagitis: Secondary | ICD-10-CM | POA: Diagnosis not present

## 2020-01-25 DIAGNOSIS — Z9861 Coronary angioplasty status: Secondary | ICD-10-CM | POA: Diagnosis not present

## 2020-01-25 DIAGNOSIS — J309 Allergic rhinitis, unspecified: Secondary | ICD-10-CM | POA: Diagnosis not present

## 2020-01-25 DIAGNOSIS — I2510 Atherosclerotic heart disease of native coronary artery without angina pectoris: Secondary | ICD-10-CM | POA: Diagnosis not present

## 2020-01-25 DIAGNOSIS — J449 Chronic obstructive pulmonary disease, unspecified: Secondary | ICD-10-CM | POA: Diagnosis not present

## 2020-01-25 DIAGNOSIS — Z794 Long term (current) use of insulin: Secondary | ICD-10-CM | POA: Diagnosis not present

## 2020-01-25 DIAGNOSIS — E119 Type 2 diabetes mellitus without complications: Secondary | ICD-10-CM | POA: Diagnosis not present

## 2020-01-25 DIAGNOSIS — H3530 Unspecified macular degeneration: Secondary | ICD-10-CM | POA: Diagnosis not present

## 2020-01-25 DIAGNOSIS — M349 Systemic sclerosis, unspecified: Secondary | ICD-10-CM | POA: Diagnosis not present

## 2020-01-25 DIAGNOSIS — Z87891 Personal history of nicotine dependence: Secondary | ICD-10-CM | POA: Diagnosis not present

## 2020-01-25 DIAGNOSIS — M1990 Unspecified osteoarthritis, unspecified site: Secondary | ICD-10-CM | POA: Diagnosis not present

## 2020-01-25 DIAGNOSIS — K225 Diverticulum of esophagus, acquired: Secondary | ICD-10-CM | POA: Diagnosis not present

## 2020-01-25 DIAGNOSIS — Z8673 Personal history of transient ischemic attack (TIA), and cerebral infarction without residual deficits: Secondary | ICD-10-CM | POA: Diagnosis not present

## 2020-01-25 DIAGNOSIS — Z950 Presence of cardiac pacemaker: Secondary | ICD-10-CM | POA: Diagnosis not present

## 2020-01-25 DIAGNOSIS — J8489 Other specified interstitial pulmonary diseases: Secondary | ICD-10-CM | POA: Diagnosis not present

## 2020-01-29 DIAGNOSIS — J449 Chronic obstructive pulmonary disease, unspecified: Secondary | ICD-10-CM | POA: Diagnosis not present

## 2020-01-29 DIAGNOSIS — Z9861 Coronary angioplasty status: Secondary | ICD-10-CM | POA: Diagnosis not present

## 2020-01-29 DIAGNOSIS — J8489 Other specified interstitial pulmonary diseases: Secondary | ICD-10-CM | POA: Diagnosis not present

## 2020-01-29 DIAGNOSIS — Z950 Presence of cardiac pacemaker: Secondary | ICD-10-CM | POA: Diagnosis not present

## 2020-01-29 DIAGNOSIS — I2510 Atherosclerotic heart disease of native coronary artery without angina pectoris: Secondary | ICD-10-CM | POA: Diagnosis not present

## 2020-01-29 DIAGNOSIS — M349 Systemic sclerosis, unspecified: Secondary | ICD-10-CM | POA: Diagnosis not present

## 2020-02-04 DIAGNOSIS — J449 Chronic obstructive pulmonary disease, unspecified: Secondary | ICD-10-CM | POA: Diagnosis not present

## 2020-02-04 DIAGNOSIS — Z950 Presence of cardiac pacemaker: Secondary | ICD-10-CM | POA: Diagnosis not present

## 2020-02-04 DIAGNOSIS — I2510 Atherosclerotic heart disease of native coronary artery without angina pectoris: Secondary | ICD-10-CM | POA: Diagnosis not present

## 2020-02-04 DIAGNOSIS — J8489 Other specified interstitial pulmonary diseases: Secondary | ICD-10-CM | POA: Diagnosis not present

## 2020-02-04 DIAGNOSIS — M349 Systemic sclerosis, unspecified: Secondary | ICD-10-CM | POA: Diagnosis not present

## 2020-02-04 DIAGNOSIS — Z9861 Coronary angioplasty status: Secondary | ICD-10-CM | POA: Diagnosis not present

## 2020-02-05 DIAGNOSIS — Z950 Presence of cardiac pacemaker: Secondary | ICD-10-CM | POA: Diagnosis not present

## 2020-02-05 DIAGNOSIS — Z9861 Coronary angioplasty status: Secondary | ICD-10-CM | POA: Diagnosis not present

## 2020-02-05 DIAGNOSIS — J8489 Other specified interstitial pulmonary diseases: Secondary | ICD-10-CM | POA: Diagnosis not present

## 2020-02-05 DIAGNOSIS — M349 Systemic sclerosis, unspecified: Secondary | ICD-10-CM | POA: Diagnosis not present

## 2020-02-05 DIAGNOSIS — J449 Chronic obstructive pulmonary disease, unspecified: Secondary | ICD-10-CM | POA: Diagnosis not present

## 2020-02-05 DIAGNOSIS — I2510 Atherosclerotic heart disease of native coronary artery without angina pectoris: Secondary | ICD-10-CM | POA: Diagnosis not present

## 2020-02-12 DIAGNOSIS — Z9861 Coronary angioplasty status: Secondary | ICD-10-CM | POA: Diagnosis not present

## 2020-02-12 DIAGNOSIS — J8489 Other specified interstitial pulmonary diseases: Secondary | ICD-10-CM | POA: Diagnosis not present

## 2020-02-12 DIAGNOSIS — I2510 Atherosclerotic heart disease of native coronary artery without angina pectoris: Secondary | ICD-10-CM | POA: Diagnosis not present

## 2020-02-12 DIAGNOSIS — M349 Systemic sclerosis, unspecified: Secondary | ICD-10-CM | POA: Diagnosis not present

## 2020-02-12 DIAGNOSIS — Z950 Presence of cardiac pacemaker: Secondary | ICD-10-CM | POA: Diagnosis not present

## 2020-02-12 DIAGNOSIS — J449 Chronic obstructive pulmonary disease, unspecified: Secondary | ICD-10-CM | POA: Diagnosis not present

## 2020-02-18 DIAGNOSIS — J8489 Other specified interstitial pulmonary diseases: Secondary | ICD-10-CM | POA: Diagnosis not present

## 2020-02-18 DIAGNOSIS — Z9861 Coronary angioplasty status: Secondary | ICD-10-CM | POA: Diagnosis not present

## 2020-02-18 DIAGNOSIS — Z950 Presence of cardiac pacemaker: Secondary | ICD-10-CM | POA: Diagnosis not present

## 2020-02-18 DIAGNOSIS — M349 Systemic sclerosis, unspecified: Secondary | ICD-10-CM | POA: Diagnosis not present

## 2020-02-18 DIAGNOSIS — I2510 Atherosclerotic heart disease of native coronary artery without angina pectoris: Secondary | ICD-10-CM | POA: Diagnosis not present

## 2020-02-18 DIAGNOSIS — J449 Chronic obstructive pulmonary disease, unspecified: Secondary | ICD-10-CM | POA: Diagnosis not present

## 2020-02-19 DIAGNOSIS — J8489 Other specified interstitial pulmonary diseases: Secondary | ICD-10-CM | POA: Diagnosis not present

## 2020-02-19 DIAGNOSIS — Z9861 Coronary angioplasty status: Secondary | ICD-10-CM | POA: Diagnosis not present

## 2020-02-19 DIAGNOSIS — I2510 Atherosclerotic heart disease of native coronary artery without angina pectoris: Secondary | ICD-10-CM | POA: Diagnosis not present

## 2020-02-19 DIAGNOSIS — J449 Chronic obstructive pulmonary disease, unspecified: Secondary | ICD-10-CM | POA: Diagnosis not present

## 2020-02-19 DIAGNOSIS — Z950 Presence of cardiac pacemaker: Secondary | ICD-10-CM | POA: Diagnosis not present

## 2020-02-19 DIAGNOSIS — M349 Systemic sclerosis, unspecified: Secondary | ICD-10-CM | POA: Diagnosis not present

## 2020-02-20 DIAGNOSIS — J8489 Other specified interstitial pulmonary diseases: Secondary | ICD-10-CM | POA: Diagnosis not present

## 2020-02-20 DIAGNOSIS — M349 Systemic sclerosis, unspecified: Secondary | ICD-10-CM | POA: Diagnosis not present

## 2020-02-20 DIAGNOSIS — Z950 Presence of cardiac pacemaker: Secondary | ICD-10-CM | POA: Diagnosis not present

## 2020-02-20 DIAGNOSIS — I2510 Atherosclerotic heart disease of native coronary artery without angina pectoris: Secondary | ICD-10-CM | POA: Diagnosis not present

## 2020-02-20 DIAGNOSIS — J449 Chronic obstructive pulmonary disease, unspecified: Secondary | ICD-10-CM | POA: Diagnosis not present

## 2020-02-20 DIAGNOSIS — Z9861 Coronary angioplasty status: Secondary | ICD-10-CM | POA: Diagnosis not present

## 2020-02-23 DIAGNOSIS — Z9861 Coronary angioplasty status: Secondary | ICD-10-CM | POA: Diagnosis not present

## 2020-02-23 DIAGNOSIS — M349 Systemic sclerosis, unspecified: Secondary | ICD-10-CM | POA: Diagnosis not present

## 2020-02-23 DIAGNOSIS — I2510 Atherosclerotic heart disease of native coronary artery without angina pectoris: Secondary | ICD-10-CM | POA: Diagnosis not present

## 2020-02-23 DIAGNOSIS — Z950 Presence of cardiac pacemaker: Secondary | ICD-10-CM | POA: Diagnosis not present

## 2020-02-23 DIAGNOSIS — J449 Chronic obstructive pulmonary disease, unspecified: Secondary | ICD-10-CM | POA: Diagnosis not present

## 2020-02-23 DIAGNOSIS — J8489 Other specified interstitial pulmonary diseases: Secondary | ICD-10-CM | POA: Diagnosis not present

## 2020-02-24 DIAGNOSIS — I2510 Atherosclerotic heart disease of native coronary artery without angina pectoris: Secondary | ICD-10-CM | POA: Diagnosis not present

## 2020-02-24 DIAGNOSIS — J8489 Other specified interstitial pulmonary diseases: Secondary | ICD-10-CM | POA: Diagnosis not present

## 2020-02-24 DIAGNOSIS — Z9861 Coronary angioplasty status: Secondary | ICD-10-CM | POA: Diagnosis not present

## 2020-02-24 DIAGNOSIS — M349 Systemic sclerosis, unspecified: Secondary | ICD-10-CM | POA: Diagnosis not present

## 2020-02-24 DIAGNOSIS — J449 Chronic obstructive pulmonary disease, unspecified: Secondary | ICD-10-CM | POA: Diagnosis not present

## 2020-02-24 DIAGNOSIS — Z950 Presence of cardiac pacemaker: Secondary | ICD-10-CM | POA: Diagnosis not present

## 2020-02-25 DIAGNOSIS — I2510 Atherosclerotic heart disease of native coronary artery without angina pectoris: Secondary | ICD-10-CM | POA: Diagnosis not present

## 2020-02-25 DIAGNOSIS — H3530 Unspecified macular degeneration: Secondary | ICD-10-CM | POA: Diagnosis not present

## 2020-02-25 DIAGNOSIS — M1990 Unspecified osteoarthritis, unspecified site: Secondary | ICD-10-CM | POA: Diagnosis not present

## 2020-02-25 DIAGNOSIS — Z9861 Coronary angioplasty status: Secondary | ICD-10-CM | POA: Diagnosis not present

## 2020-02-25 DIAGNOSIS — Z794 Long term (current) use of insulin: Secondary | ICD-10-CM | POA: Diagnosis not present

## 2020-02-25 DIAGNOSIS — K219 Gastro-esophageal reflux disease without esophagitis: Secondary | ICD-10-CM | POA: Diagnosis not present

## 2020-02-25 DIAGNOSIS — Z87891 Personal history of nicotine dependence: Secondary | ICD-10-CM | POA: Diagnosis not present

## 2020-02-25 DIAGNOSIS — J8489 Other specified interstitial pulmonary diseases: Secondary | ICD-10-CM | POA: Diagnosis not present

## 2020-02-25 DIAGNOSIS — Z8673 Personal history of transient ischemic attack (TIA), and cerebral infarction without residual deficits: Secondary | ICD-10-CM | POA: Diagnosis not present

## 2020-02-25 DIAGNOSIS — I69354 Hemiplegia and hemiparesis following cerebral infarction affecting left non-dominant side: Secondary | ICD-10-CM | POA: Diagnosis not present

## 2020-02-25 DIAGNOSIS — J449 Chronic obstructive pulmonary disease, unspecified: Secondary | ICD-10-CM | POA: Diagnosis not present

## 2020-02-25 DIAGNOSIS — Z950 Presence of cardiac pacemaker: Secondary | ICD-10-CM | POA: Diagnosis not present

## 2020-02-25 DIAGNOSIS — J309 Allergic rhinitis, unspecified: Secondary | ICD-10-CM | POA: Diagnosis not present

## 2020-02-25 DIAGNOSIS — M349 Systemic sclerosis, unspecified: Secondary | ICD-10-CM | POA: Diagnosis not present

## 2020-02-25 DIAGNOSIS — K225 Diverticulum of esophagus, acquired: Secondary | ICD-10-CM | POA: Diagnosis not present

## 2020-02-25 DIAGNOSIS — E119 Type 2 diabetes mellitus without complications: Secondary | ICD-10-CM | POA: Diagnosis not present

## 2020-02-26 DIAGNOSIS — Z950 Presence of cardiac pacemaker: Secondary | ICD-10-CM | POA: Diagnosis not present

## 2020-02-26 DIAGNOSIS — M349 Systemic sclerosis, unspecified: Secondary | ICD-10-CM | POA: Diagnosis not present

## 2020-02-26 DIAGNOSIS — J8489 Other specified interstitial pulmonary diseases: Secondary | ICD-10-CM | POA: Diagnosis not present

## 2020-02-26 DIAGNOSIS — J449 Chronic obstructive pulmonary disease, unspecified: Secondary | ICD-10-CM | POA: Diagnosis not present

## 2020-02-26 DIAGNOSIS — Z9861 Coronary angioplasty status: Secondary | ICD-10-CM | POA: Diagnosis not present

## 2020-02-26 DIAGNOSIS — I2510 Atherosclerotic heart disease of native coronary artery without angina pectoris: Secondary | ICD-10-CM | POA: Diagnosis not present

## 2020-02-28 DIAGNOSIS — Z9861 Coronary angioplasty status: Secondary | ICD-10-CM | POA: Diagnosis not present

## 2020-02-28 DIAGNOSIS — I2510 Atherosclerotic heart disease of native coronary artery without angina pectoris: Secondary | ICD-10-CM | POA: Diagnosis not present

## 2020-02-28 DIAGNOSIS — J8489 Other specified interstitial pulmonary diseases: Secondary | ICD-10-CM | POA: Diagnosis not present

## 2020-02-28 DIAGNOSIS — Z950 Presence of cardiac pacemaker: Secondary | ICD-10-CM | POA: Diagnosis not present

## 2020-02-28 DIAGNOSIS — J449 Chronic obstructive pulmonary disease, unspecified: Secondary | ICD-10-CM | POA: Diagnosis not present

## 2020-02-28 DIAGNOSIS — M349 Systemic sclerosis, unspecified: Secondary | ICD-10-CM | POA: Diagnosis not present

## 2020-02-29 DIAGNOSIS — J8489 Other specified interstitial pulmonary diseases: Secondary | ICD-10-CM | POA: Diagnosis not present

## 2020-02-29 DIAGNOSIS — Z9861 Coronary angioplasty status: Secondary | ICD-10-CM | POA: Diagnosis not present

## 2020-02-29 DIAGNOSIS — M349 Systemic sclerosis, unspecified: Secondary | ICD-10-CM | POA: Diagnosis not present

## 2020-02-29 DIAGNOSIS — J449 Chronic obstructive pulmonary disease, unspecified: Secondary | ICD-10-CM | POA: Diagnosis not present

## 2020-02-29 DIAGNOSIS — Z950 Presence of cardiac pacemaker: Secondary | ICD-10-CM | POA: Diagnosis not present

## 2020-02-29 DIAGNOSIS — I2510 Atherosclerotic heart disease of native coronary artery without angina pectoris: Secondary | ICD-10-CM | POA: Diagnosis not present

## 2020-03-02 DIAGNOSIS — Z950 Presence of cardiac pacemaker: Secondary | ICD-10-CM | POA: Diagnosis not present

## 2020-03-02 DIAGNOSIS — M349 Systemic sclerosis, unspecified: Secondary | ICD-10-CM | POA: Diagnosis not present

## 2020-03-02 DIAGNOSIS — J449 Chronic obstructive pulmonary disease, unspecified: Secondary | ICD-10-CM | POA: Diagnosis not present

## 2020-03-02 DIAGNOSIS — I2510 Atherosclerotic heart disease of native coronary artery without angina pectoris: Secondary | ICD-10-CM | POA: Diagnosis not present

## 2020-03-02 DIAGNOSIS — Z9861 Coronary angioplasty status: Secondary | ICD-10-CM | POA: Diagnosis not present

## 2020-03-02 DIAGNOSIS — J8489 Other specified interstitial pulmonary diseases: Secondary | ICD-10-CM | POA: Diagnosis not present

## 2020-03-04 DIAGNOSIS — Z950 Presence of cardiac pacemaker: Secondary | ICD-10-CM | POA: Diagnosis not present

## 2020-03-04 DIAGNOSIS — J449 Chronic obstructive pulmonary disease, unspecified: Secondary | ICD-10-CM | POA: Diagnosis not present

## 2020-03-04 DIAGNOSIS — M349 Systemic sclerosis, unspecified: Secondary | ICD-10-CM | POA: Diagnosis not present

## 2020-03-04 DIAGNOSIS — Z9861 Coronary angioplasty status: Secondary | ICD-10-CM | POA: Diagnosis not present

## 2020-03-04 DIAGNOSIS — J8489 Other specified interstitial pulmonary diseases: Secondary | ICD-10-CM | POA: Diagnosis not present

## 2020-03-04 DIAGNOSIS — I2510 Atherosclerotic heart disease of native coronary artery without angina pectoris: Secondary | ICD-10-CM | POA: Diagnosis not present

## 2020-03-06 DIAGNOSIS — J449 Chronic obstructive pulmonary disease, unspecified: Secondary | ICD-10-CM | POA: Diagnosis not present

## 2020-03-06 DIAGNOSIS — J8489 Other specified interstitial pulmonary diseases: Secondary | ICD-10-CM | POA: Diagnosis not present

## 2020-03-06 DIAGNOSIS — Z950 Presence of cardiac pacemaker: Secondary | ICD-10-CM | POA: Diagnosis not present

## 2020-03-06 DIAGNOSIS — Z9861 Coronary angioplasty status: Secondary | ICD-10-CM | POA: Diagnosis not present

## 2020-03-06 DIAGNOSIS — I2510 Atherosclerotic heart disease of native coronary artery without angina pectoris: Secondary | ICD-10-CM | POA: Diagnosis not present

## 2020-03-06 DIAGNOSIS — M349 Systemic sclerosis, unspecified: Secondary | ICD-10-CM | POA: Diagnosis not present

## 2020-03-08 DIAGNOSIS — Z9861 Coronary angioplasty status: Secondary | ICD-10-CM | POA: Diagnosis not present

## 2020-03-08 DIAGNOSIS — Z950 Presence of cardiac pacemaker: Secondary | ICD-10-CM | POA: Diagnosis not present

## 2020-03-08 DIAGNOSIS — M349 Systemic sclerosis, unspecified: Secondary | ICD-10-CM | POA: Diagnosis not present

## 2020-03-08 DIAGNOSIS — J8489 Other specified interstitial pulmonary diseases: Secondary | ICD-10-CM | POA: Diagnosis not present

## 2020-03-08 DIAGNOSIS — J449 Chronic obstructive pulmonary disease, unspecified: Secondary | ICD-10-CM | POA: Diagnosis not present

## 2020-03-08 DIAGNOSIS — I2510 Atherosclerotic heart disease of native coronary artery without angina pectoris: Secondary | ICD-10-CM | POA: Diagnosis not present

## 2020-03-09 DIAGNOSIS — I2510 Atherosclerotic heart disease of native coronary artery without angina pectoris: Secondary | ICD-10-CM | POA: Diagnosis not present

## 2020-03-09 DIAGNOSIS — M349 Systemic sclerosis, unspecified: Secondary | ICD-10-CM | POA: Diagnosis not present

## 2020-03-09 DIAGNOSIS — Z9861 Coronary angioplasty status: Secondary | ICD-10-CM | POA: Diagnosis not present

## 2020-03-09 DIAGNOSIS — Z950 Presence of cardiac pacemaker: Secondary | ICD-10-CM | POA: Diagnosis not present

## 2020-03-09 DIAGNOSIS — J449 Chronic obstructive pulmonary disease, unspecified: Secondary | ICD-10-CM | POA: Diagnosis not present

## 2020-03-09 DIAGNOSIS — J8489 Other specified interstitial pulmonary diseases: Secondary | ICD-10-CM | POA: Diagnosis not present

## 2020-03-10 DIAGNOSIS — I2510 Atherosclerotic heart disease of native coronary artery without angina pectoris: Secondary | ICD-10-CM | POA: Diagnosis not present

## 2020-03-10 DIAGNOSIS — Z950 Presence of cardiac pacemaker: Secondary | ICD-10-CM | POA: Diagnosis not present

## 2020-03-10 DIAGNOSIS — J8489 Other specified interstitial pulmonary diseases: Secondary | ICD-10-CM | POA: Diagnosis not present

## 2020-03-10 DIAGNOSIS — M349 Systemic sclerosis, unspecified: Secondary | ICD-10-CM | POA: Diagnosis not present

## 2020-03-10 DIAGNOSIS — Z9861 Coronary angioplasty status: Secondary | ICD-10-CM | POA: Diagnosis not present

## 2020-03-10 DIAGNOSIS — J449 Chronic obstructive pulmonary disease, unspecified: Secondary | ICD-10-CM | POA: Diagnosis not present

## 2020-03-11 DIAGNOSIS — J449 Chronic obstructive pulmonary disease, unspecified: Secondary | ICD-10-CM | POA: Diagnosis not present

## 2020-03-11 DIAGNOSIS — I2510 Atherosclerotic heart disease of native coronary artery without angina pectoris: Secondary | ICD-10-CM | POA: Diagnosis not present

## 2020-03-11 DIAGNOSIS — M349 Systemic sclerosis, unspecified: Secondary | ICD-10-CM | POA: Diagnosis not present

## 2020-03-11 DIAGNOSIS — J8489 Other specified interstitial pulmonary diseases: Secondary | ICD-10-CM | POA: Diagnosis not present

## 2020-03-11 DIAGNOSIS — Z9861 Coronary angioplasty status: Secondary | ICD-10-CM | POA: Diagnosis not present

## 2020-03-11 DIAGNOSIS — Z950 Presence of cardiac pacemaker: Secondary | ICD-10-CM | POA: Diagnosis not present

## 2020-03-12 DIAGNOSIS — Z950 Presence of cardiac pacemaker: Secondary | ICD-10-CM | POA: Diagnosis not present

## 2020-03-12 DIAGNOSIS — I2510 Atherosclerotic heart disease of native coronary artery without angina pectoris: Secondary | ICD-10-CM | POA: Diagnosis not present

## 2020-03-12 DIAGNOSIS — J8489 Other specified interstitial pulmonary diseases: Secondary | ICD-10-CM | POA: Diagnosis not present

## 2020-03-12 DIAGNOSIS — J449 Chronic obstructive pulmonary disease, unspecified: Secondary | ICD-10-CM | POA: Diagnosis not present

## 2020-03-12 DIAGNOSIS — Z9861 Coronary angioplasty status: Secondary | ICD-10-CM | POA: Diagnosis not present

## 2020-03-12 DIAGNOSIS — M349 Systemic sclerosis, unspecified: Secondary | ICD-10-CM | POA: Diagnosis not present

## 2020-03-13 DIAGNOSIS — I2510 Atherosclerotic heart disease of native coronary artery without angina pectoris: Secondary | ICD-10-CM | POA: Diagnosis not present

## 2020-03-13 DIAGNOSIS — Z9861 Coronary angioplasty status: Secondary | ICD-10-CM | POA: Diagnosis not present

## 2020-03-13 DIAGNOSIS — J449 Chronic obstructive pulmonary disease, unspecified: Secondary | ICD-10-CM | POA: Diagnosis not present

## 2020-03-13 DIAGNOSIS — Z950 Presence of cardiac pacemaker: Secondary | ICD-10-CM | POA: Diagnosis not present

## 2020-03-13 DIAGNOSIS — J8489 Other specified interstitial pulmonary diseases: Secondary | ICD-10-CM | POA: Diagnosis not present

## 2020-03-13 DIAGNOSIS — M349 Systemic sclerosis, unspecified: Secondary | ICD-10-CM | POA: Diagnosis not present

## 2020-03-16 DIAGNOSIS — Z950 Presence of cardiac pacemaker: Secondary | ICD-10-CM | POA: Diagnosis not present

## 2020-03-16 DIAGNOSIS — Z9861 Coronary angioplasty status: Secondary | ICD-10-CM | POA: Diagnosis not present

## 2020-03-16 DIAGNOSIS — J8489 Other specified interstitial pulmonary diseases: Secondary | ICD-10-CM | POA: Diagnosis not present

## 2020-03-16 DIAGNOSIS — I2510 Atherosclerotic heart disease of native coronary artery without angina pectoris: Secondary | ICD-10-CM | POA: Diagnosis not present

## 2020-03-16 DIAGNOSIS — M349 Systemic sclerosis, unspecified: Secondary | ICD-10-CM | POA: Diagnosis not present

## 2020-03-16 DIAGNOSIS — J449 Chronic obstructive pulmonary disease, unspecified: Secondary | ICD-10-CM | POA: Diagnosis not present

## 2020-03-19 DIAGNOSIS — I2510 Atherosclerotic heart disease of native coronary artery without angina pectoris: Secondary | ICD-10-CM | POA: Diagnosis not present

## 2020-03-19 DIAGNOSIS — J449 Chronic obstructive pulmonary disease, unspecified: Secondary | ICD-10-CM | POA: Diagnosis not present

## 2020-03-19 DIAGNOSIS — Z9861 Coronary angioplasty status: Secondary | ICD-10-CM | POA: Diagnosis not present

## 2020-03-19 DIAGNOSIS — Z950 Presence of cardiac pacemaker: Secondary | ICD-10-CM | POA: Diagnosis not present

## 2020-03-19 DIAGNOSIS — M349 Systemic sclerosis, unspecified: Secondary | ICD-10-CM | POA: Diagnosis not present

## 2020-03-19 DIAGNOSIS — J8489 Other specified interstitial pulmonary diseases: Secondary | ICD-10-CM | POA: Diagnosis not present

## 2020-03-21 DIAGNOSIS — J8489 Other specified interstitial pulmonary diseases: Secondary | ICD-10-CM | POA: Diagnosis not present

## 2020-03-21 DIAGNOSIS — M349 Systemic sclerosis, unspecified: Secondary | ICD-10-CM | POA: Diagnosis not present

## 2020-03-21 DIAGNOSIS — Z950 Presence of cardiac pacemaker: Secondary | ICD-10-CM | POA: Diagnosis not present

## 2020-03-21 DIAGNOSIS — Z9861 Coronary angioplasty status: Secondary | ICD-10-CM | POA: Diagnosis not present

## 2020-03-21 DIAGNOSIS — I2510 Atherosclerotic heart disease of native coronary artery without angina pectoris: Secondary | ICD-10-CM | POA: Diagnosis not present

## 2020-03-21 DIAGNOSIS — J449 Chronic obstructive pulmonary disease, unspecified: Secondary | ICD-10-CM | POA: Diagnosis not present

## 2020-03-23 DIAGNOSIS — M349 Systemic sclerosis, unspecified: Secondary | ICD-10-CM | POA: Diagnosis not present

## 2020-03-23 DIAGNOSIS — Z9861 Coronary angioplasty status: Secondary | ICD-10-CM | POA: Diagnosis not present

## 2020-03-23 DIAGNOSIS — Z950 Presence of cardiac pacemaker: Secondary | ICD-10-CM | POA: Diagnosis not present

## 2020-03-23 DIAGNOSIS — I2510 Atherosclerotic heart disease of native coronary artery without angina pectoris: Secondary | ICD-10-CM | POA: Diagnosis not present

## 2020-03-23 DIAGNOSIS — J8489 Other specified interstitial pulmonary diseases: Secondary | ICD-10-CM | POA: Diagnosis not present

## 2020-03-23 DIAGNOSIS — J449 Chronic obstructive pulmonary disease, unspecified: Secondary | ICD-10-CM | POA: Diagnosis not present

## 2020-03-25 DIAGNOSIS — Z9861 Coronary angioplasty status: Secondary | ICD-10-CM | POA: Diagnosis not present

## 2020-03-25 DIAGNOSIS — M349 Systemic sclerosis, unspecified: Secondary | ICD-10-CM | POA: Diagnosis not present

## 2020-03-25 DIAGNOSIS — Z950 Presence of cardiac pacemaker: Secondary | ICD-10-CM | POA: Diagnosis not present

## 2020-03-25 DIAGNOSIS — J449 Chronic obstructive pulmonary disease, unspecified: Secondary | ICD-10-CM | POA: Diagnosis not present

## 2020-03-25 DIAGNOSIS — I2510 Atherosclerotic heart disease of native coronary artery without angina pectoris: Secondary | ICD-10-CM | POA: Diagnosis not present

## 2020-03-25 DIAGNOSIS — J8489 Other specified interstitial pulmonary diseases: Secondary | ICD-10-CM | POA: Diagnosis not present

## 2020-03-26 DIAGNOSIS — M349 Systemic sclerosis, unspecified: Secondary | ICD-10-CM | POA: Diagnosis not present

## 2020-03-26 DIAGNOSIS — K225 Diverticulum of esophagus, acquired: Secondary | ICD-10-CM | POA: Diagnosis not present

## 2020-03-26 DIAGNOSIS — Z8673 Personal history of transient ischemic attack (TIA), and cerebral infarction without residual deficits: Secondary | ICD-10-CM | POA: Diagnosis not present

## 2020-03-26 DIAGNOSIS — Z794 Long term (current) use of insulin: Secondary | ICD-10-CM | POA: Diagnosis not present

## 2020-03-26 DIAGNOSIS — J8489 Other specified interstitial pulmonary diseases: Secondary | ICD-10-CM | POA: Diagnosis not present

## 2020-03-26 DIAGNOSIS — J449 Chronic obstructive pulmonary disease, unspecified: Secondary | ICD-10-CM | POA: Diagnosis not present

## 2020-03-26 DIAGNOSIS — Z950 Presence of cardiac pacemaker: Secondary | ICD-10-CM | POA: Diagnosis not present

## 2020-03-26 DIAGNOSIS — I69354 Hemiplegia and hemiparesis following cerebral infarction affecting left non-dominant side: Secondary | ICD-10-CM | POA: Diagnosis not present

## 2020-03-26 DIAGNOSIS — Z9861 Coronary angioplasty status: Secondary | ICD-10-CM | POA: Diagnosis not present

## 2020-03-26 DIAGNOSIS — J309 Allergic rhinitis, unspecified: Secondary | ICD-10-CM | POA: Diagnosis not present

## 2020-03-26 DIAGNOSIS — K219 Gastro-esophageal reflux disease without esophagitis: Secondary | ICD-10-CM | POA: Diagnosis not present

## 2020-03-26 DIAGNOSIS — Z87891 Personal history of nicotine dependence: Secondary | ICD-10-CM | POA: Diagnosis not present

## 2020-03-26 DIAGNOSIS — H3530 Unspecified macular degeneration: Secondary | ICD-10-CM | POA: Diagnosis not present

## 2020-03-26 DIAGNOSIS — E119 Type 2 diabetes mellitus without complications: Secondary | ICD-10-CM | POA: Diagnosis not present

## 2020-03-26 DIAGNOSIS — M1990 Unspecified osteoarthritis, unspecified site: Secondary | ICD-10-CM | POA: Diagnosis not present

## 2020-03-26 DIAGNOSIS — I2510 Atherosclerotic heart disease of native coronary artery without angina pectoris: Secondary | ICD-10-CM | POA: Diagnosis not present

## 2020-03-28 DIAGNOSIS — Z9861 Coronary angioplasty status: Secondary | ICD-10-CM | POA: Diagnosis not present

## 2020-03-28 DIAGNOSIS — J449 Chronic obstructive pulmonary disease, unspecified: Secondary | ICD-10-CM | POA: Diagnosis not present

## 2020-03-28 DIAGNOSIS — I2510 Atherosclerotic heart disease of native coronary artery without angina pectoris: Secondary | ICD-10-CM | POA: Diagnosis not present

## 2020-03-28 DIAGNOSIS — M349 Systemic sclerosis, unspecified: Secondary | ICD-10-CM | POA: Diagnosis not present

## 2020-03-28 DIAGNOSIS — Z950 Presence of cardiac pacemaker: Secondary | ICD-10-CM | POA: Diagnosis not present

## 2020-03-28 DIAGNOSIS — J8489 Other specified interstitial pulmonary diseases: Secondary | ICD-10-CM | POA: Diagnosis not present

## 2020-03-30 DIAGNOSIS — J449 Chronic obstructive pulmonary disease, unspecified: Secondary | ICD-10-CM | POA: Diagnosis not present

## 2020-03-30 DIAGNOSIS — I2510 Atherosclerotic heart disease of native coronary artery without angina pectoris: Secondary | ICD-10-CM | POA: Diagnosis not present

## 2020-03-30 DIAGNOSIS — Z950 Presence of cardiac pacemaker: Secondary | ICD-10-CM | POA: Diagnosis not present

## 2020-03-30 DIAGNOSIS — J8489 Other specified interstitial pulmonary diseases: Secondary | ICD-10-CM | POA: Diagnosis not present

## 2020-03-30 DIAGNOSIS — Z9861 Coronary angioplasty status: Secondary | ICD-10-CM | POA: Diagnosis not present

## 2020-03-30 DIAGNOSIS — M349 Systemic sclerosis, unspecified: Secondary | ICD-10-CM | POA: Diagnosis not present

## 2020-04-01 DIAGNOSIS — Z9861 Coronary angioplasty status: Secondary | ICD-10-CM | POA: Diagnosis not present

## 2020-04-01 DIAGNOSIS — Z950 Presence of cardiac pacemaker: Secondary | ICD-10-CM | POA: Diagnosis not present

## 2020-04-01 DIAGNOSIS — M349 Systemic sclerosis, unspecified: Secondary | ICD-10-CM | POA: Diagnosis not present

## 2020-04-01 DIAGNOSIS — J449 Chronic obstructive pulmonary disease, unspecified: Secondary | ICD-10-CM | POA: Diagnosis not present

## 2020-04-01 DIAGNOSIS — I2510 Atherosclerotic heart disease of native coronary artery without angina pectoris: Secondary | ICD-10-CM | POA: Diagnosis not present

## 2020-04-01 DIAGNOSIS — J8489 Other specified interstitial pulmonary diseases: Secondary | ICD-10-CM | POA: Diagnosis not present

## 2020-04-02 DIAGNOSIS — J449 Chronic obstructive pulmonary disease, unspecified: Secondary | ICD-10-CM | POA: Diagnosis not present

## 2020-04-02 DIAGNOSIS — Z950 Presence of cardiac pacemaker: Secondary | ICD-10-CM | POA: Diagnosis not present

## 2020-04-02 DIAGNOSIS — M349 Systemic sclerosis, unspecified: Secondary | ICD-10-CM | POA: Diagnosis not present

## 2020-04-02 DIAGNOSIS — Z9861 Coronary angioplasty status: Secondary | ICD-10-CM | POA: Diagnosis not present

## 2020-04-02 DIAGNOSIS — J8489 Other specified interstitial pulmonary diseases: Secondary | ICD-10-CM | POA: Diagnosis not present

## 2020-04-02 DIAGNOSIS — I2510 Atherosclerotic heart disease of native coronary artery without angina pectoris: Secondary | ICD-10-CM | POA: Diagnosis not present

## 2020-04-04 DIAGNOSIS — Z9861 Coronary angioplasty status: Secondary | ICD-10-CM | POA: Diagnosis not present

## 2020-04-04 DIAGNOSIS — I2510 Atherosclerotic heart disease of native coronary artery without angina pectoris: Secondary | ICD-10-CM | POA: Diagnosis not present

## 2020-04-04 DIAGNOSIS — J449 Chronic obstructive pulmonary disease, unspecified: Secondary | ICD-10-CM | POA: Diagnosis not present

## 2020-04-04 DIAGNOSIS — J8489 Other specified interstitial pulmonary diseases: Secondary | ICD-10-CM | POA: Diagnosis not present

## 2020-04-04 DIAGNOSIS — M349 Systemic sclerosis, unspecified: Secondary | ICD-10-CM | POA: Diagnosis not present

## 2020-04-04 DIAGNOSIS — Z950 Presence of cardiac pacemaker: Secondary | ICD-10-CM | POA: Diagnosis not present

## 2020-04-06 DIAGNOSIS — M349 Systemic sclerosis, unspecified: Secondary | ICD-10-CM | POA: Diagnosis not present

## 2020-04-06 DIAGNOSIS — I2510 Atherosclerotic heart disease of native coronary artery without angina pectoris: Secondary | ICD-10-CM | POA: Diagnosis not present

## 2020-04-06 DIAGNOSIS — Z9861 Coronary angioplasty status: Secondary | ICD-10-CM | POA: Diagnosis not present

## 2020-04-06 DIAGNOSIS — J8489 Other specified interstitial pulmonary diseases: Secondary | ICD-10-CM | POA: Diagnosis not present

## 2020-04-06 DIAGNOSIS — Z950 Presence of cardiac pacemaker: Secondary | ICD-10-CM | POA: Diagnosis not present

## 2020-04-06 DIAGNOSIS — J449 Chronic obstructive pulmonary disease, unspecified: Secondary | ICD-10-CM | POA: Diagnosis not present

## 2020-04-08 DIAGNOSIS — Z950 Presence of cardiac pacemaker: Secondary | ICD-10-CM | POA: Diagnosis not present

## 2020-04-08 DIAGNOSIS — M349 Systemic sclerosis, unspecified: Secondary | ICD-10-CM | POA: Diagnosis not present

## 2020-04-08 DIAGNOSIS — I2510 Atherosclerotic heart disease of native coronary artery without angina pectoris: Secondary | ICD-10-CM | POA: Diagnosis not present

## 2020-04-08 DIAGNOSIS — Z9861 Coronary angioplasty status: Secondary | ICD-10-CM | POA: Diagnosis not present

## 2020-04-08 DIAGNOSIS — J449 Chronic obstructive pulmonary disease, unspecified: Secondary | ICD-10-CM | POA: Diagnosis not present

## 2020-04-08 DIAGNOSIS — J8489 Other specified interstitial pulmonary diseases: Secondary | ICD-10-CM | POA: Diagnosis not present

## 2020-04-09 DIAGNOSIS — J8489 Other specified interstitial pulmonary diseases: Secondary | ICD-10-CM | POA: Diagnosis not present

## 2020-04-09 DIAGNOSIS — Z950 Presence of cardiac pacemaker: Secondary | ICD-10-CM | POA: Diagnosis not present

## 2020-04-09 DIAGNOSIS — Z9861 Coronary angioplasty status: Secondary | ICD-10-CM | POA: Diagnosis not present

## 2020-04-09 DIAGNOSIS — I2510 Atherosclerotic heart disease of native coronary artery without angina pectoris: Secondary | ICD-10-CM | POA: Diagnosis not present

## 2020-04-09 DIAGNOSIS — J449 Chronic obstructive pulmonary disease, unspecified: Secondary | ICD-10-CM | POA: Diagnosis not present

## 2020-04-09 DIAGNOSIS — M349 Systemic sclerosis, unspecified: Secondary | ICD-10-CM | POA: Diagnosis not present

## 2020-04-13 DIAGNOSIS — Z950 Presence of cardiac pacemaker: Secondary | ICD-10-CM | POA: Diagnosis not present

## 2020-04-13 DIAGNOSIS — I2510 Atherosclerotic heart disease of native coronary artery without angina pectoris: Secondary | ICD-10-CM | POA: Diagnosis not present

## 2020-04-13 DIAGNOSIS — J449 Chronic obstructive pulmonary disease, unspecified: Secondary | ICD-10-CM | POA: Diagnosis not present

## 2020-04-13 DIAGNOSIS — J8489 Other specified interstitial pulmonary diseases: Secondary | ICD-10-CM | POA: Diagnosis not present

## 2020-04-13 DIAGNOSIS — Z9861 Coronary angioplasty status: Secondary | ICD-10-CM | POA: Diagnosis not present

## 2020-04-13 DIAGNOSIS — M349 Systemic sclerosis, unspecified: Secondary | ICD-10-CM | POA: Diagnosis not present

## 2020-04-14 DIAGNOSIS — J449 Chronic obstructive pulmonary disease, unspecified: Secondary | ICD-10-CM | POA: Diagnosis not present

## 2020-04-14 DIAGNOSIS — M349 Systemic sclerosis, unspecified: Secondary | ICD-10-CM | POA: Diagnosis not present

## 2020-04-14 DIAGNOSIS — Z950 Presence of cardiac pacemaker: Secondary | ICD-10-CM | POA: Diagnosis not present

## 2020-04-14 DIAGNOSIS — Z9861 Coronary angioplasty status: Secondary | ICD-10-CM | POA: Diagnosis not present

## 2020-04-14 DIAGNOSIS — I2510 Atherosclerotic heart disease of native coronary artery without angina pectoris: Secondary | ICD-10-CM | POA: Diagnosis not present

## 2020-04-14 DIAGNOSIS — J8489 Other specified interstitial pulmonary diseases: Secondary | ICD-10-CM | POA: Diagnosis not present

## 2020-04-15 ENCOUNTER — Telehealth: Payer: Self-pay | Admitting: Internal Medicine

## 2020-04-15 NOTE — Telephone Encounter (Signed)
Spoke w patient's daughter. Pt had remained under hospice care and passed last night.  Her daughter was w her.  She wanted to pass on that patient always appreciated how much Dr. Harrington Challenger had done for her and wanted to let her know that.

## 2020-04-15 NOTE — Telephone Encounter (Signed)
Good morning,  Krissia's daughter called in this morning, she wanted to let Dr. Harrington Challenger know that they lost Creta last night.

## 2020-04-19 NOTE — Telephone Encounter (Signed)
Spoke to daughter.

## 2020-04-26 DEATH — deceased

## 2020-05-04 ENCOUNTER — Telehealth: Payer: Self-pay | Admitting: Pulmonary Disease

## 2020-05-04 NOTE — Telephone Encounter (Signed)
Daughter called to let us know that pt passed away 28-Apr-2023 under hospice care.  Expressed condolences to daughter.  Forwarding to [facility] as FYI.

## 2020-05-06 NOTE — Telephone Encounter (Signed)
Hopefully she is a peace now.

## 2020-10-28 IMAGING — CR DG CHEST 2V
2 series · 2 of 2 positions shown · non-contrast
Comparison: 07/13/2017

CLINICAL DATA: Dyspnea with productive cough and fever.

EXAM:
CHEST - 2 VIEW

[chest lat]
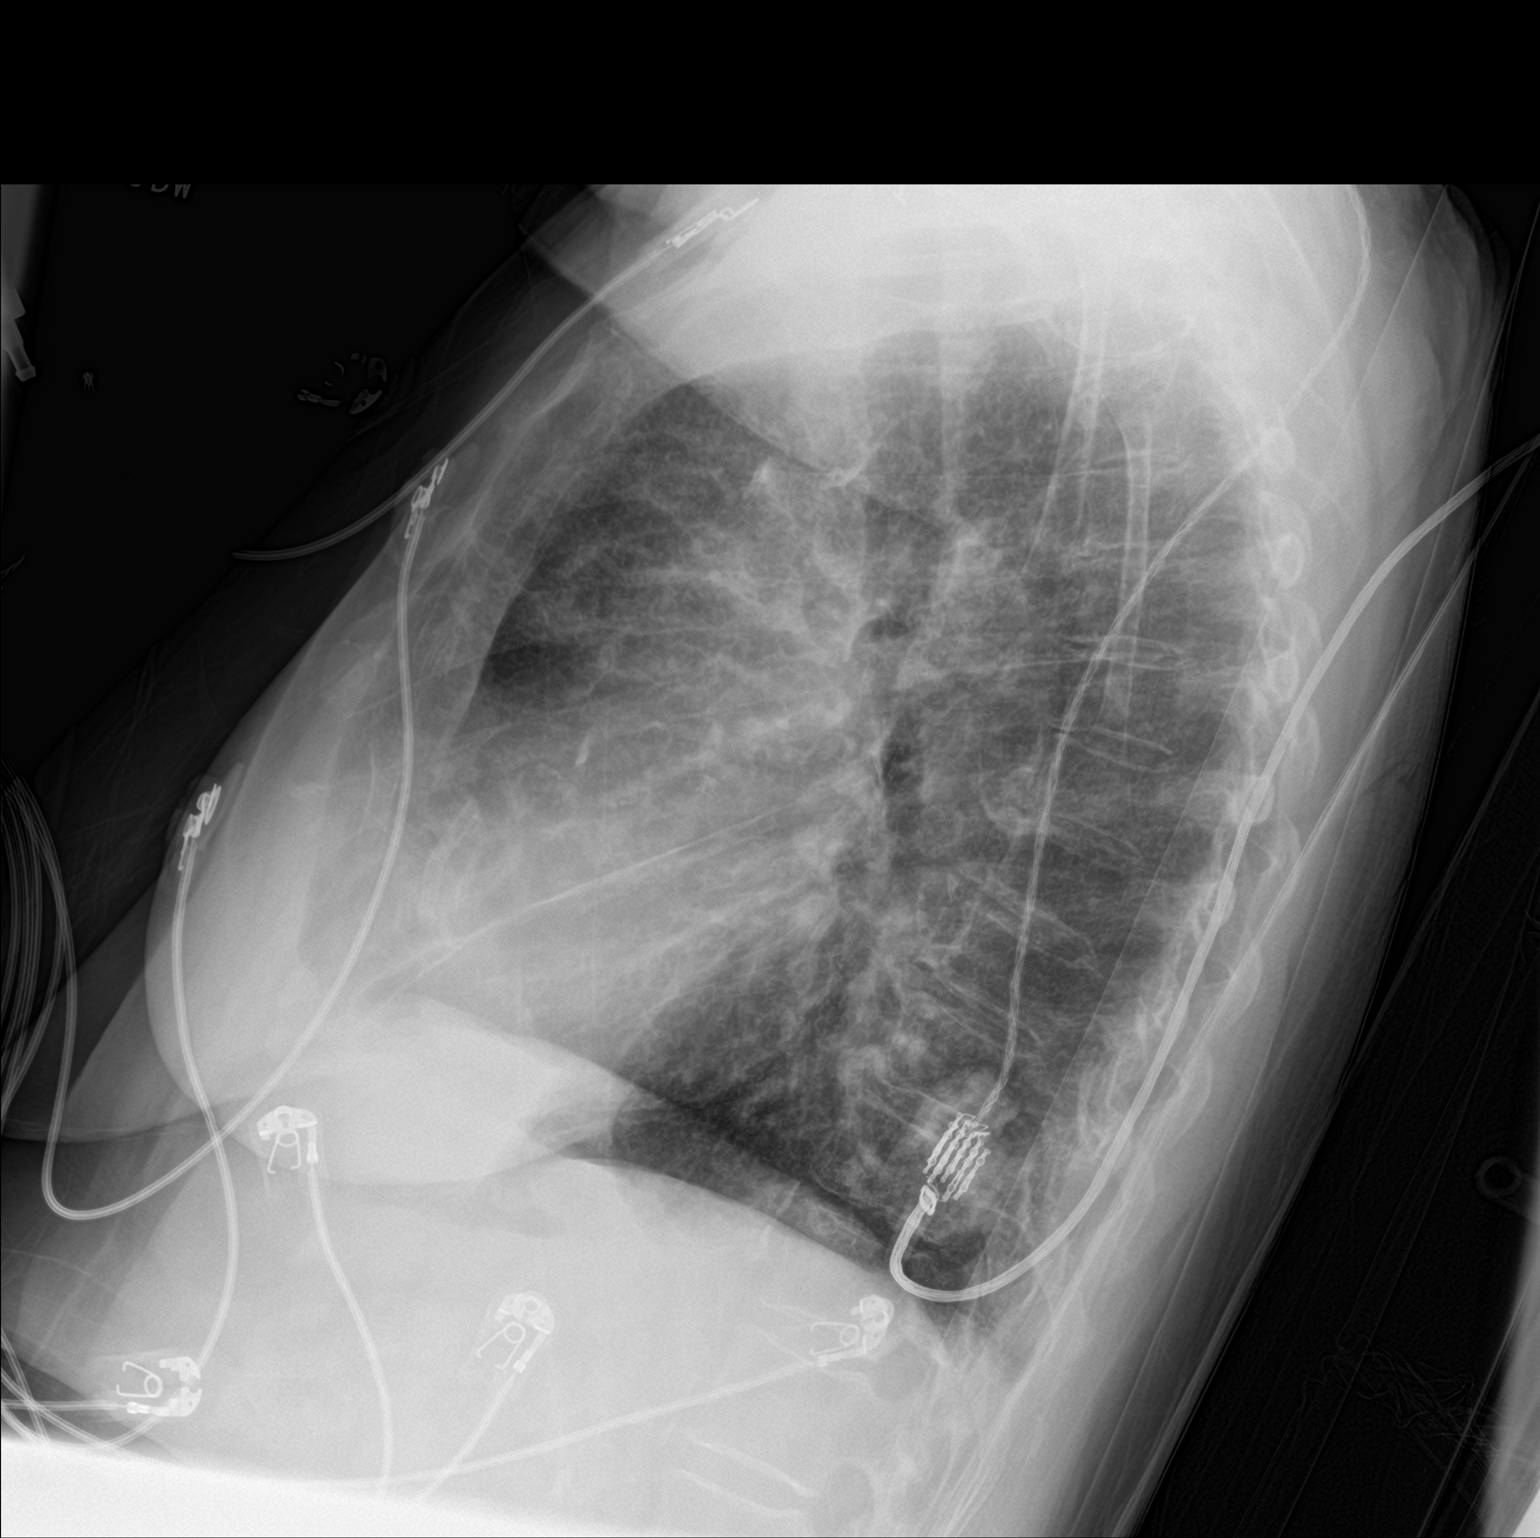

[chest ap]
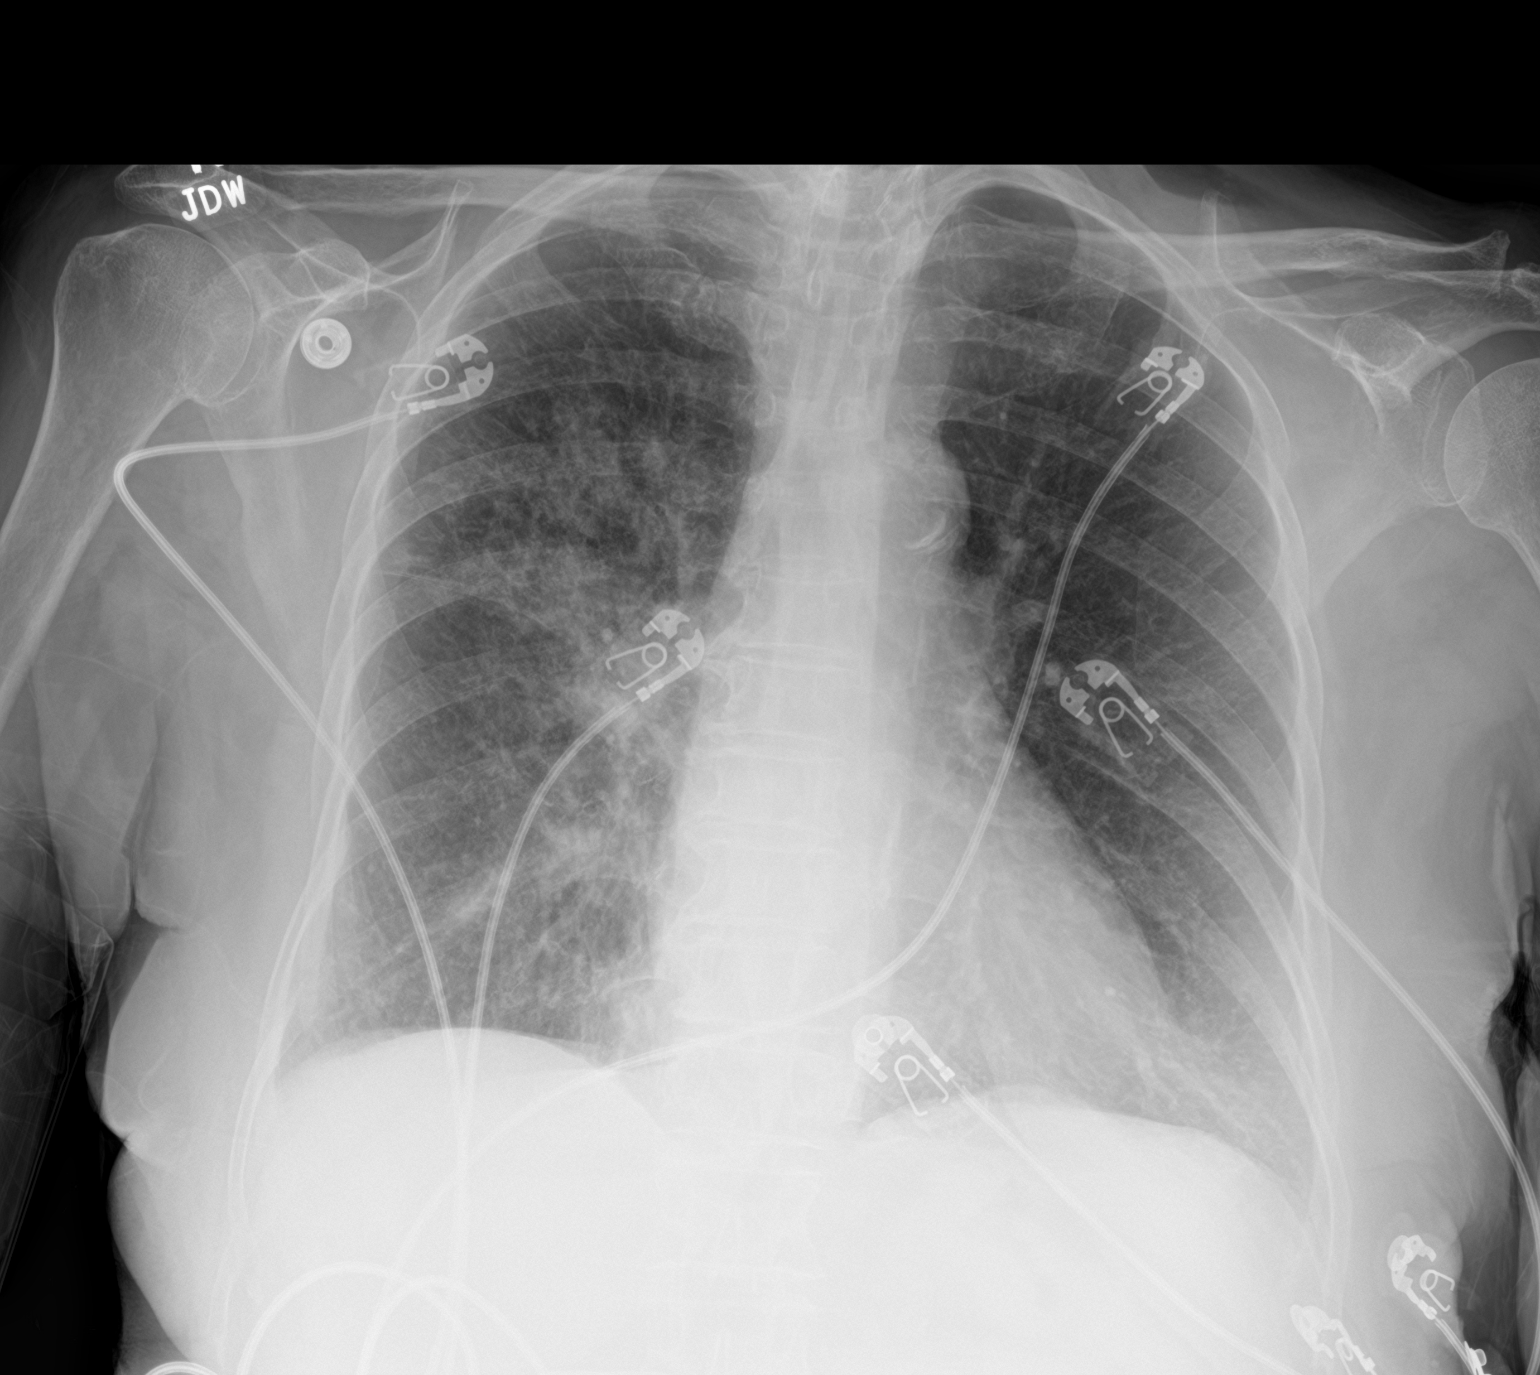

[2 of 2 positions shown; findings below may reference images not displayed]

FINDINGS: Heart size and mediastinal contours are stable with aortic
atherosclerosis. Surgical clips project over the base of the neck on
the left prior left thyroid lobectomy. Asymmetric airspace opacities
are identified about the right hilum, right upper and lower lobe
predominant compatible with multilobar pneumonia, less likely
unilateral CHF. Left lung remains relatively clear. No acute osseous
abnormality.
IMPRESSION: Pulmonary opacities about the right hilum, right upper and lower
lobes compatible with multilobar pneumonia, less likely unilateral
CHF given history of fever and cough.

## 2020-11-02 IMAGING — CR DG CHEST 2V
2 series · 2 of 2 positions shown · non-contrast
Comparison: 06/17/2018 and 07/13/2017

CLINICAL DATA: Multifocal pneumonia. Sepsis. Shortness of breath.

EXAM:
CHEST - 2 VIEW

[chest pa]
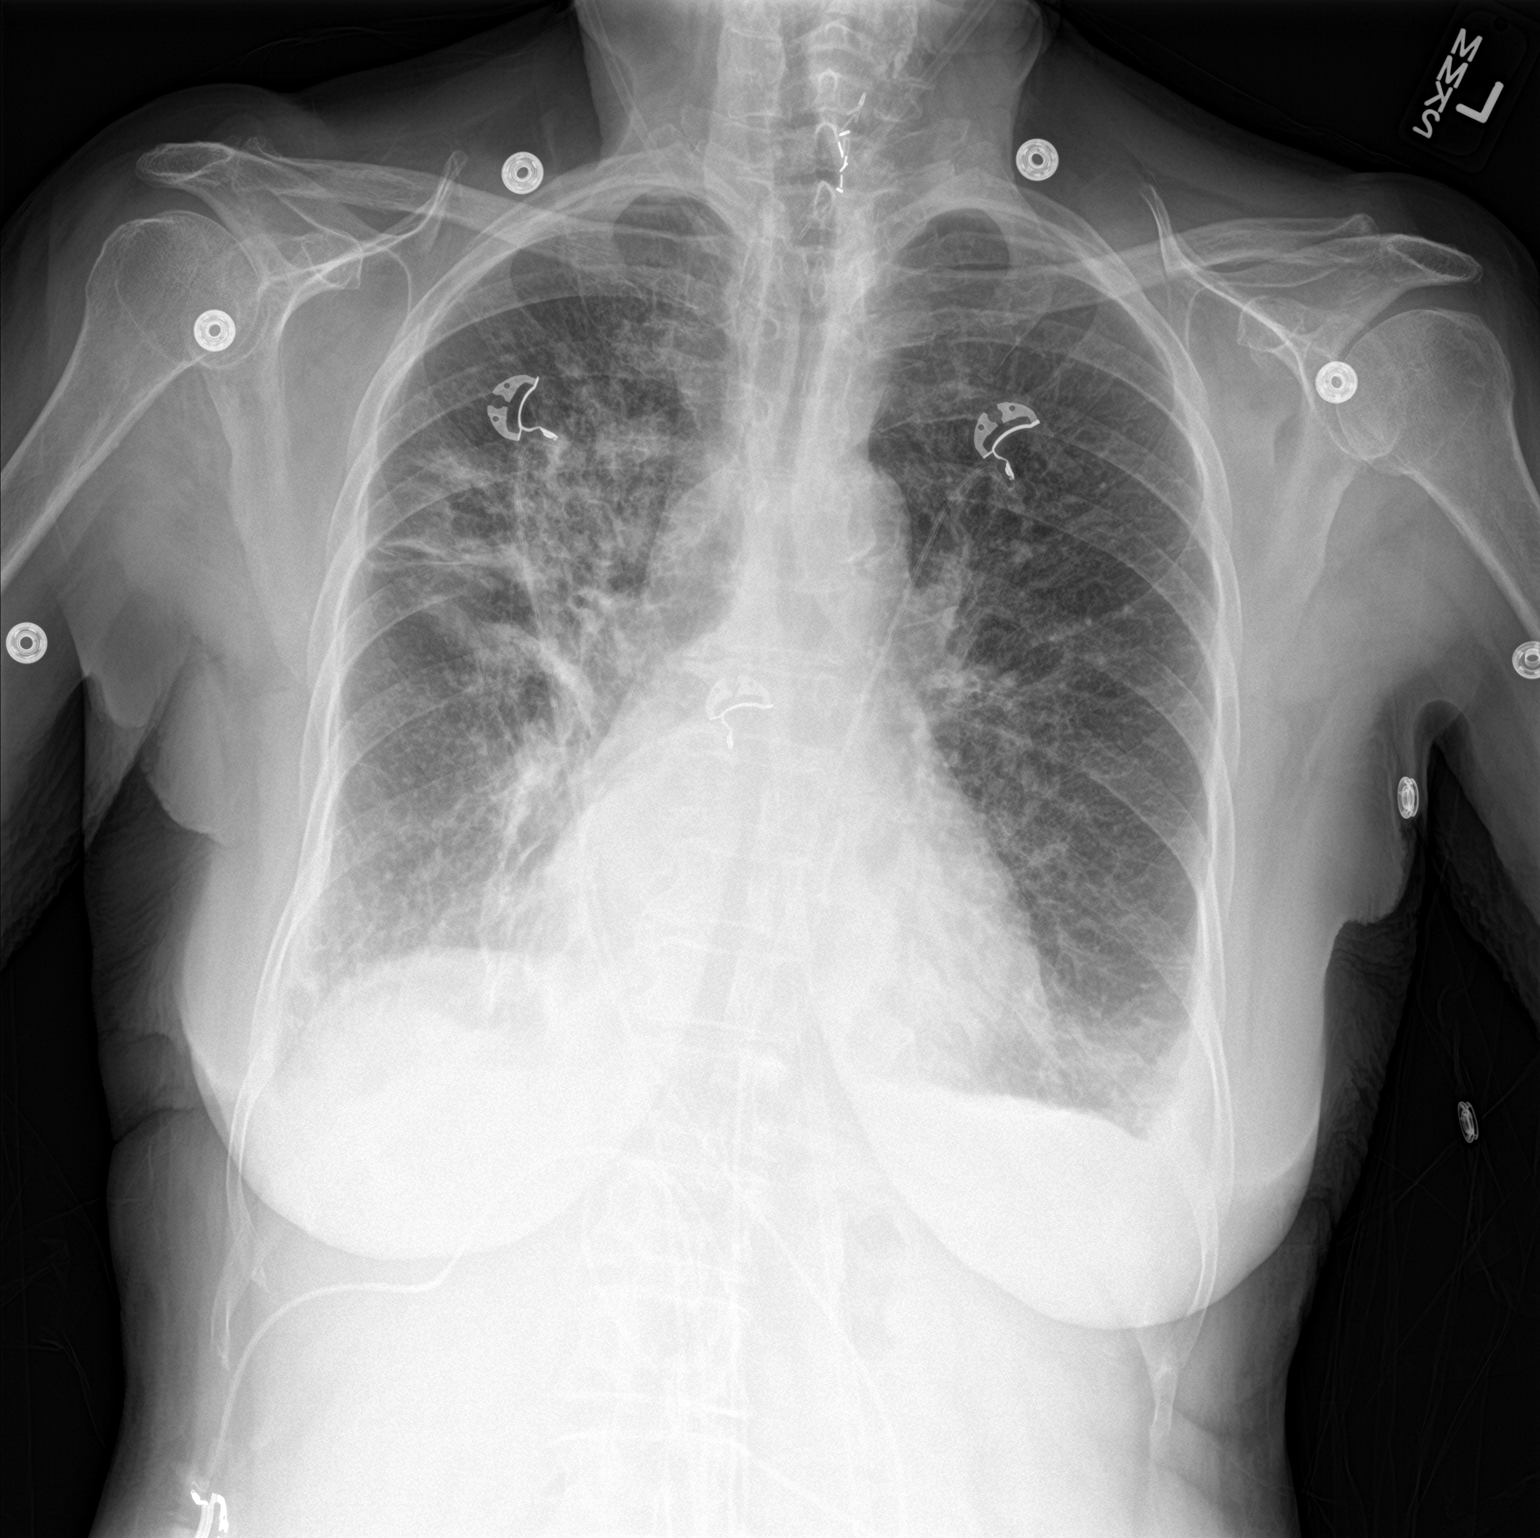

[chest lat]
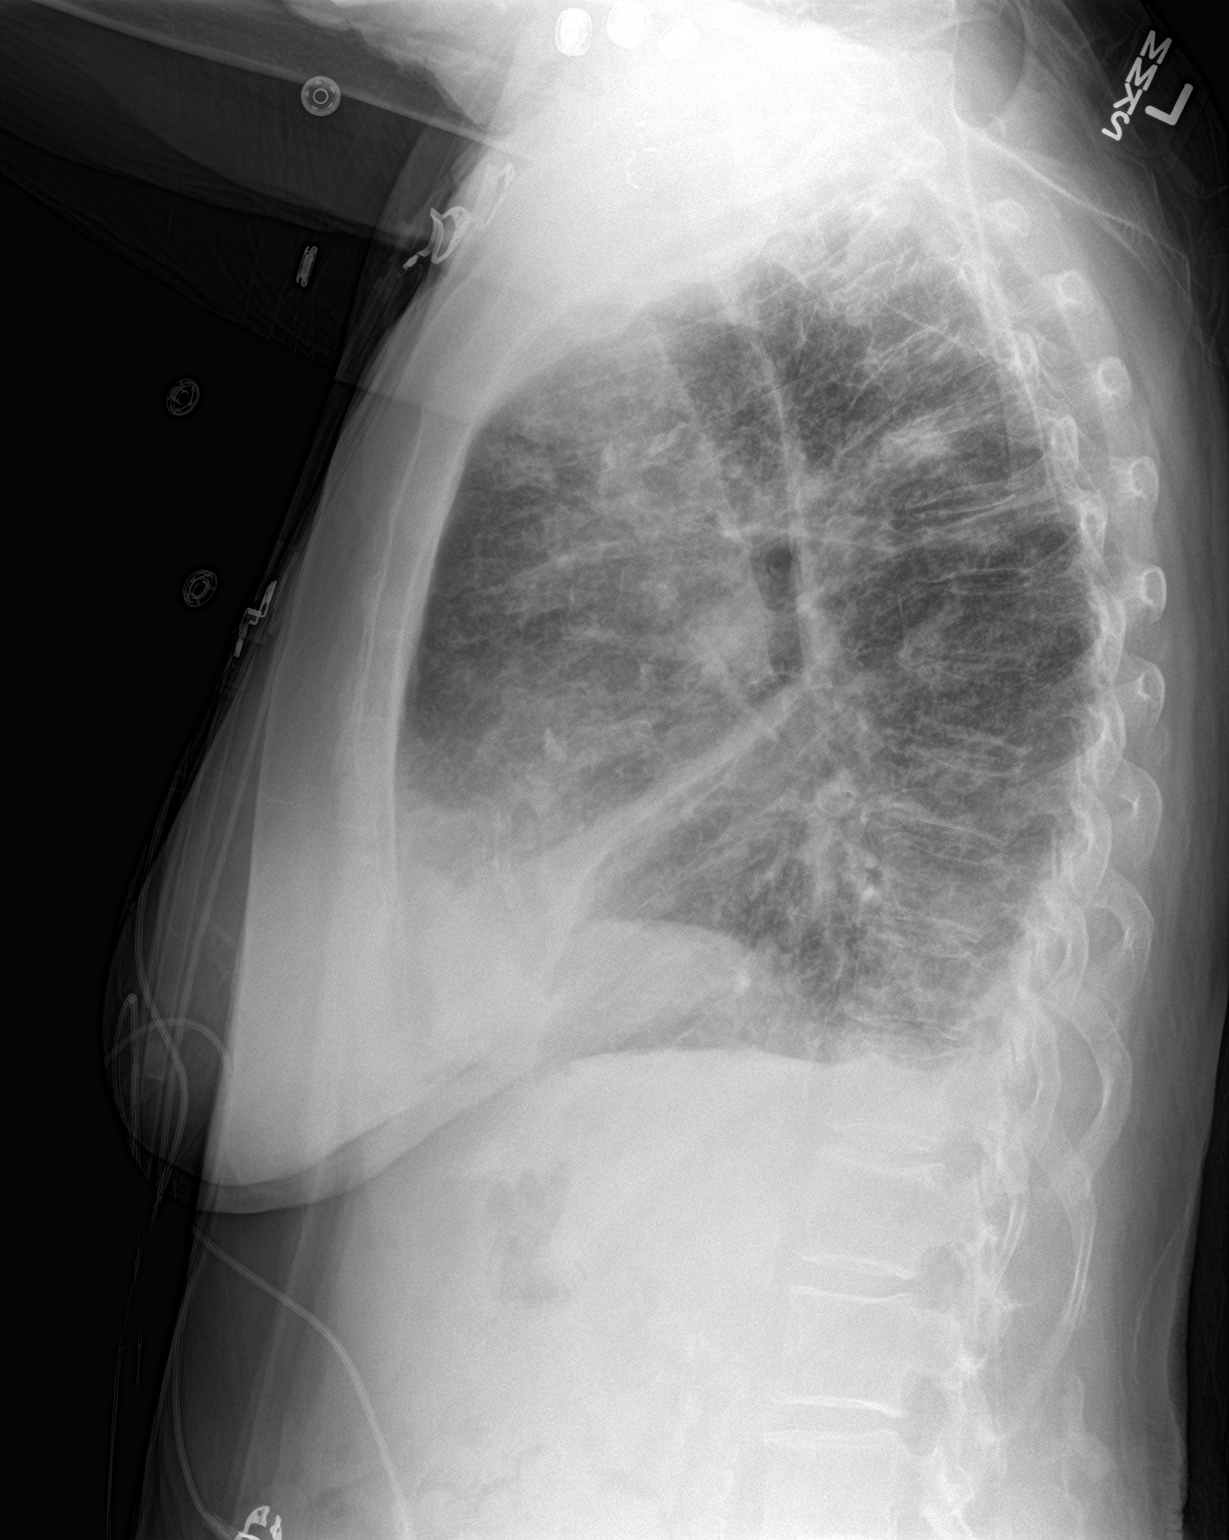

[2 of 2 positions shown; findings below may reference images not displayed]

FINDINGS: There has been slight progression of the infiltrates in the right
lung. There is new pulmonary vascular congestion with slight
distention of the azygos vein and new accentuation of the
interstitial markings bilaterally as well as development of minimal
bilateral pleural effusions. There is new pleural fluid along 1 of
the major fissures on the lateral view.

Bones are normal. Aortic atherosclerosis.
IMPRESSION: 1. Progressive infiltrates in the right lung consistent with
pneumonia.
2. New pulmonary vascular congestion, interstitial edema, and small
effusions, suggesting congestive heart failure.
